# Patient Record
Sex: Female | Born: 1935
Health system: Southern US, Community
[De-identification: ages and names within clinical notes are randomized; demographics above are authoritative.]

## PROBLEM LIST (undated history)

## (undated) ENCOUNTER — Emergency Department (HOSPITAL_COMMUNITY): Disposition: A | Discharge status: Home / Self Care | Payer: Medicare Other

## (undated) DIAGNOSIS — J9801 Acute bronchospasm: Secondary | ICD-10-CM

## (undated) DIAGNOSIS — M1990 Unspecified osteoarthritis, unspecified site: Secondary | ICD-10-CM

## (undated) DIAGNOSIS — I5022 Chronic systolic (congestive) heart failure: Secondary | ICD-10-CM

## (undated) DIAGNOSIS — M5000 Cervical disc disorder with myelopathy, unspecified cervical region: Secondary | ICD-10-CM

## (undated) DIAGNOSIS — R0789 Other chest pain: Secondary | ICD-10-CM

## (undated) DIAGNOSIS — I428 Other cardiomyopathies: Secondary | ICD-10-CM

## (undated) DIAGNOSIS — I219 Acute myocardial infarction, unspecified: Secondary | ICD-10-CM

## (undated) DIAGNOSIS — J449 Chronic obstructive pulmonary disease, unspecified: Secondary | ICD-10-CM

## (undated) DIAGNOSIS — I2510 Atherosclerotic heart disease of native coronary artery without angina pectoris: Secondary | ICD-10-CM

## (undated) DIAGNOSIS — I10 Essential (primary) hypertension: Secondary | ICD-10-CM

## (undated) DIAGNOSIS — I712 Thoracic aortic aneurysm, without rupture: Secondary | ICD-10-CM

## (undated) DIAGNOSIS — Q330 Congenital cystic lung: Secondary | ICD-10-CM

## (undated) DIAGNOSIS — I7121 Aneurysm of the ascending aorta, without rupture: Secondary | ICD-10-CM

## (undated) DIAGNOSIS — R160 Hepatomegaly, not elsewhere classified: Secondary | ICD-10-CM

## (undated) DIAGNOSIS — Z9981 Dependence on supplemental oxygen: Secondary | ICD-10-CM

## (undated) DIAGNOSIS — F419 Anxiety disorder, unspecified: Secondary | ICD-10-CM

## (undated) DIAGNOSIS — N200 Calculus of kidney: Secondary | ICD-10-CM

## (undated) HISTORY — DX: Congenital cystic lung: Q33.0

## (undated) HISTORY — PX: TONSILLECTOMY AND ADENOIDECTOMY: SUR1326

## (undated) HISTORY — DX: Hepatomegaly, not elsewhere classified: R16.0

## (undated) HISTORY — DX: Other chest pain: R07.89

## (undated) HISTORY — DX: Atherosclerotic heart disease of native coronary artery without angina pectoris: I25.10

## (undated) HISTORY — DX: Cervical disc disorder with myelopathy, unspecified cervical region: M50.00

## (undated) HISTORY — DX: Chronic systolic (congestive) heart failure: I50.22

## (undated) HISTORY — PX: COLONOSCOPY: SHX174

## (undated) HISTORY — PX: CHOLECYSTECTOMY: SHX55

## (undated) HISTORY — DX: Unspecified osteoarthritis, unspecified site: M19.90

## (undated) HISTORY — PX: OTHER SURGICAL HISTORY: SHX169

## (undated) HISTORY — DX: Acute bronchospasm: J98.01

---

## 1999-07-12 ENCOUNTER — Ambulatory Visit (HOSPITAL_COMMUNITY): Admission: RE | Admit: 1999-07-12 | Discharge: 1999-07-12 | Payer: Self-pay | Admitting: Cardiology

## 2000-12-13 ENCOUNTER — Ambulatory Visit (HOSPITAL_COMMUNITY): Admission: RE | Admit: 2000-12-13 | Discharge: 2000-12-13 | Payer: Self-pay | Admitting: Internal Medicine

## 2000-12-27 ENCOUNTER — Encounter (INDEPENDENT_AMBULATORY_CARE_PROVIDER_SITE_OTHER): Payer: Self-pay | Admitting: Internal Medicine

## 2000-12-27 ENCOUNTER — Ambulatory Visit (HOSPITAL_COMMUNITY): Admission: RE | Admit: 2000-12-27 | Discharge: 2000-12-27 | Payer: Self-pay | Admitting: Internal Medicine

## 2001-05-06 ENCOUNTER — Ambulatory Visit (HOSPITAL_COMMUNITY): Admission: RE | Admit: 2001-05-06 | Discharge: 2001-05-06 | Payer: Self-pay | Admitting: Internal Medicine

## 2001-05-06 ENCOUNTER — Encounter (INDEPENDENT_AMBULATORY_CARE_PROVIDER_SITE_OTHER): Payer: Self-pay | Admitting: Internal Medicine

## 2001-09-22 ENCOUNTER — Inpatient Hospital Stay (HOSPITAL_COMMUNITY): Admission: RE | Admit: 2001-09-22 | Discharge: 2001-09-23 | Payer: Self-pay | Admitting: Neurological Surgery

## 2001-09-22 ENCOUNTER — Encounter: Payer: Self-pay | Admitting: Neurological Surgery

## 2002-03-19 ENCOUNTER — Encounter (HOSPITAL_COMMUNITY): Admission: RE | Admit: 2002-03-19 | Discharge: 2002-04-18 | Payer: Self-pay | Admitting: Rheumatology

## 2002-04-30 ENCOUNTER — Encounter (HOSPITAL_COMMUNITY): Admission: RE | Admit: 2002-04-30 | Discharge: 2002-05-30 | Payer: Self-pay | Admitting: Rheumatology

## 2002-06-05 ENCOUNTER — Encounter (HOSPITAL_COMMUNITY): Admission: RE | Admit: 2002-06-05 | Discharge: 2002-07-05 | Payer: Self-pay | Admitting: Rheumatology

## 2002-07-16 ENCOUNTER — Encounter (HOSPITAL_COMMUNITY): Admission: RE | Admit: 2002-07-16 | Discharge: 2002-08-15 | Payer: Self-pay | Admitting: Rheumatology

## 2002-09-24 ENCOUNTER — Encounter (HOSPITAL_COMMUNITY): Admission: RE | Admit: 2002-09-24 | Discharge: 2002-10-24 | Payer: Self-pay | Admitting: Rheumatology

## 2002-12-07 ENCOUNTER — Encounter (INDEPENDENT_AMBULATORY_CARE_PROVIDER_SITE_OTHER): Payer: Self-pay | Admitting: Internal Medicine

## 2002-12-07 ENCOUNTER — Ambulatory Visit (HOSPITAL_COMMUNITY): Admission: RE | Admit: 2002-12-07 | Discharge: 2002-12-07 | Payer: Self-pay | Admitting: Internal Medicine

## 2002-12-09 ENCOUNTER — Encounter (INDEPENDENT_AMBULATORY_CARE_PROVIDER_SITE_OTHER): Payer: Self-pay | Admitting: Internal Medicine

## 2002-12-09 ENCOUNTER — Ambulatory Visit (HOSPITAL_COMMUNITY): Admission: RE | Admit: 2002-12-09 | Discharge: 2002-12-09 | Payer: Self-pay | Admitting: Internal Medicine

## 2002-12-10 ENCOUNTER — Encounter (INDEPENDENT_AMBULATORY_CARE_PROVIDER_SITE_OTHER): Payer: Self-pay | Admitting: Internal Medicine

## 2002-12-10 ENCOUNTER — Ambulatory Visit (HOSPITAL_COMMUNITY): Admission: RE | Admit: 2002-12-10 | Discharge: 2002-12-10 | Payer: Self-pay | Admitting: Internal Medicine

## 2002-12-17 ENCOUNTER — Encounter (HOSPITAL_COMMUNITY): Admission: RE | Admit: 2002-12-17 | Discharge: 2003-01-16 | Payer: Self-pay | Admitting: Rheumatology

## 2003-08-10 ENCOUNTER — Ambulatory Visit (HOSPITAL_COMMUNITY): Admission: RE | Admit: 2003-08-10 | Discharge: 2003-08-10 | Payer: Self-pay | Admitting: Pulmonary Disease

## 2003-09-08 ENCOUNTER — Ambulatory Visit (HOSPITAL_COMMUNITY): Admission: RE | Admit: 2003-09-08 | Discharge: 2003-09-08 | Payer: Self-pay | Admitting: Internal Medicine

## 2003-10-05 ENCOUNTER — Ambulatory Visit (HOSPITAL_COMMUNITY): Admission: RE | Admit: 2003-10-05 | Discharge: 2003-10-05 | Payer: Self-pay | Admitting: Pulmonary Disease

## 2004-04-19 ENCOUNTER — Ambulatory Visit: Payer: Self-pay | Admitting: Cardiology

## 2004-04-24 ENCOUNTER — Inpatient Hospital Stay (HOSPITAL_BASED_OUTPATIENT_CLINIC_OR_DEPARTMENT_OTHER): Admission: RE | Admit: 2004-04-24 | Discharge: 2004-04-24 | Payer: Self-pay | Admitting: Cardiovascular Disease

## 2004-04-24 ENCOUNTER — Ambulatory Visit: Payer: Self-pay | Admitting: Cardiovascular Disease

## 2004-05-02 ENCOUNTER — Ambulatory Visit: Payer: Self-pay | Admitting: Cardiology

## 2004-07-06 ENCOUNTER — Ambulatory Visit: Payer: Self-pay | Admitting: Cardiology

## 2004-07-13 ENCOUNTER — Ambulatory Visit: Payer: Self-pay | Admitting: Internal Medicine

## 2004-08-03 ENCOUNTER — Ambulatory Visit: Payer: Self-pay | Admitting: Internal Medicine

## 2004-08-03 ENCOUNTER — Ambulatory Visit (HOSPITAL_COMMUNITY): Admission: RE | Admit: 2004-08-03 | Discharge: 2004-08-03 | Payer: Self-pay | Admitting: Internal Medicine

## 2004-08-29 ENCOUNTER — Ambulatory Visit (HOSPITAL_COMMUNITY): Admission: RE | Admit: 2004-08-29 | Discharge: 2004-08-29 | Payer: Self-pay | Admitting: Family Medicine

## 2004-10-18 ENCOUNTER — Ambulatory Visit: Payer: Self-pay | Admitting: Cardiology

## 2005-02-22 ENCOUNTER — Ambulatory Visit (HOSPITAL_COMMUNITY): Admission: RE | Admit: 2005-02-22 | Discharge: 2005-02-22 | Payer: Self-pay | Admitting: Pulmonary Disease

## 2005-03-01 ENCOUNTER — Ambulatory Visit (HOSPITAL_COMMUNITY): Admission: RE | Admit: 2005-03-01 | Discharge: 2005-03-01 | Payer: Self-pay | Admitting: Pulmonary Disease

## 2005-07-24 ENCOUNTER — Ambulatory Visit: Payer: Self-pay | Admitting: Cardiology

## 2005-08-22 ENCOUNTER — Ambulatory Visit: Payer: Self-pay | Admitting: Cardiology

## 2005-08-29 ENCOUNTER — Ambulatory Visit: Payer: Self-pay | Admitting: Cardiology

## 2005-09-04 ENCOUNTER — Ambulatory Visit (HOSPITAL_COMMUNITY): Admission: RE | Admit: 2005-09-04 | Discharge: 2005-09-04 | Payer: Self-pay | Admitting: Pulmonary Disease

## 2006-01-03 ENCOUNTER — Ambulatory Visit: Payer: Self-pay | Admitting: Cardiology

## 2006-01-11 ENCOUNTER — Ambulatory Visit: Payer: Self-pay | Admitting: Cardiology

## 2006-03-24 ENCOUNTER — Emergency Department (HOSPITAL_COMMUNITY): Admission: EM | Admit: 2006-03-24 | Discharge: 2006-03-24 | Payer: Self-pay | Admitting: Emergency Medicine

## 2006-05-22 ENCOUNTER — Ambulatory Visit: Payer: Self-pay | Admitting: Cardiology

## 2006-05-28 ENCOUNTER — Ambulatory Visit: Payer: Self-pay | Admitting: Cardiology

## 2006-11-01 ENCOUNTER — Ambulatory Visit: Payer: Self-pay | Admitting: Physician Assistant

## 2006-11-28 ENCOUNTER — Ambulatory Visit (HOSPITAL_COMMUNITY): Admission: RE | Admit: 2006-11-28 | Discharge: 2006-11-28 | Payer: Self-pay | Admitting: Pulmonary Disease

## 2006-12-04 ENCOUNTER — Ambulatory Visit (HOSPITAL_COMMUNITY): Admission: RE | Admit: 2006-12-04 | Discharge: 2006-12-04 | Payer: Self-pay | Admitting: Pulmonary Disease

## 2006-12-17 ENCOUNTER — Encounter (INDEPENDENT_AMBULATORY_CARE_PROVIDER_SITE_OTHER): Payer: Self-pay | Admitting: Interventional Radiology

## 2006-12-17 ENCOUNTER — Other Ambulatory Visit: Admission: RE | Admit: 2006-12-17 | Discharge: 2006-12-17 | Payer: Self-pay | Admitting: Interventional Radiology

## 2006-12-17 ENCOUNTER — Encounter: Admission: RE | Admit: 2006-12-17 | Discharge: 2006-12-17 | Payer: Self-pay | Admitting: Pulmonary Disease

## 2007-03-04 ENCOUNTER — Ambulatory Visit: Payer: Self-pay | Admitting: Cardiology

## 2007-03-12 ENCOUNTER — Ambulatory Visit: Payer: Self-pay | Admitting: Cardiology

## 2007-04-04 ENCOUNTER — Ambulatory Visit (HOSPITAL_COMMUNITY): Admission: RE | Admit: 2007-04-04 | Discharge: 2007-04-04 | Payer: Self-pay | Admitting: Pulmonary Disease

## 2007-04-07 ENCOUNTER — Ambulatory Visit: Payer: Self-pay | Admitting: Cardiology

## 2008-01-14 ENCOUNTER — Ambulatory Visit: Payer: Self-pay | Admitting: Cardiology

## 2008-04-07 ENCOUNTER — Encounter (INDEPENDENT_AMBULATORY_CARE_PROVIDER_SITE_OTHER): Payer: Self-pay | Admitting: Pulmonary Disease

## 2008-04-07 ENCOUNTER — Ambulatory Visit: Payer: Self-pay | Admitting: Cardiology

## 2008-04-07 ENCOUNTER — Ambulatory Visit (HOSPITAL_COMMUNITY): Admission: RE | Admit: 2008-04-07 | Discharge: 2008-04-07 | Payer: Self-pay | Admitting: Pulmonary Disease

## 2008-05-24 ENCOUNTER — Ambulatory Visit (HOSPITAL_COMMUNITY): Admission: RE | Admit: 2008-05-24 | Discharge: 2008-05-24 | Payer: Self-pay | Admitting: Pulmonary Disease

## 2008-06-09 ENCOUNTER — Ambulatory Visit: Payer: Self-pay | Admitting: Cardiology

## 2008-06-14 ENCOUNTER — Ambulatory Visit: Payer: Self-pay | Admitting: Surgery

## 2008-06-18 ENCOUNTER — Emergency Department (HOSPITAL_COMMUNITY): Admission: EM | Admit: 2008-06-18 | Discharge: 2008-06-18 | Payer: Self-pay | Admitting: Emergency Medicine

## 2008-06-29 ENCOUNTER — Ambulatory Visit: Payer: Self-pay | Admitting: Thoracic Surgery (Cardiothoracic Vascular Surgery)

## 2008-10-18 ENCOUNTER — Ambulatory Visit (HOSPITAL_COMMUNITY): Admission: RE | Admit: 2008-10-18 | Discharge: 2008-10-18 | Payer: Self-pay | Admitting: Cardiovascular Disease

## 2009-01-17 ENCOUNTER — Encounter: Payer: Self-pay | Admitting: Orthopedic Surgery

## 2009-01-17 ENCOUNTER — Emergency Department (HOSPITAL_COMMUNITY): Admission: EM | Admit: 2009-01-17 | Discharge: 2009-01-17 | Payer: Self-pay | Admitting: Emergency Medicine

## 2009-01-19 ENCOUNTER — Ambulatory Visit: Payer: Self-pay | Admitting: Orthopedic Surgery

## 2009-01-19 DIAGNOSIS — M542 Cervicalgia: Secondary | ICD-10-CM | POA: Insufficient documentation

## 2009-01-19 DIAGNOSIS — M7580 Other shoulder lesions, unspecified shoulder: Secondary | ICD-10-CM

## 2009-01-19 DIAGNOSIS — M25519 Pain in unspecified shoulder: Secondary | ICD-10-CM | POA: Insufficient documentation

## 2009-01-19 DIAGNOSIS — M25819 Other specified joint disorders, unspecified shoulder: Secondary | ICD-10-CM | POA: Insufficient documentation

## 2009-01-19 HISTORY — DX: Cervicalgia: M54.2

## 2009-02-03 ENCOUNTER — Emergency Department (HOSPITAL_COMMUNITY): Admission: EM | Admit: 2009-02-03 | Discharge: 2009-02-03 | Payer: Self-pay | Admitting: Emergency Medicine

## 2009-02-18 ENCOUNTER — Ambulatory Visit (HOSPITAL_COMMUNITY): Admission: RE | Admit: 2009-02-18 | Discharge: 2009-02-18 | Payer: Self-pay | Admitting: Neurological Surgery

## 2009-02-22 ENCOUNTER — Ambulatory Visit (HOSPITAL_COMMUNITY): Admission: RE | Admit: 2009-02-22 | Discharge: 2009-02-22 | Payer: Self-pay | Admitting: Pulmonary Disease

## 2009-04-04 ENCOUNTER — Ambulatory Visit (HOSPITAL_COMMUNITY): Admission: RE | Admit: 2009-04-04 | Discharge: 2009-04-04 | Payer: Self-pay | Admitting: Pulmonary Disease

## 2009-06-14 ENCOUNTER — Ambulatory Visit (HOSPITAL_COMMUNITY): Admission: RE | Admit: 2009-06-14 | Discharge: 2009-06-14 | Payer: Self-pay | Admitting: Pulmonary Disease

## 2009-07-27 ENCOUNTER — Emergency Department (HOSPITAL_COMMUNITY): Admission: EM | Admit: 2009-07-27 | Discharge: 2009-07-27 | Payer: Self-pay | Admitting: Emergency Medicine

## 2009-08-11 ENCOUNTER — Encounter (INDEPENDENT_AMBULATORY_CARE_PROVIDER_SITE_OTHER): Payer: Self-pay

## 2009-09-15 ENCOUNTER — Ambulatory Visit (HOSPITAL_COMMUNITY): Admission: RE | Admit: 2009-09-15 | Discharge: 2009-09-15 | Payer: Self-pay | Admitting: Pulmonary Disease

## 2009-09-26 ENCOUNTER — Ambulatory Visit (HOSPITAL_COMMUNITY): Admission: RE | Admit: 2009-09-26 | Discharge: 2009-09-26 | Payer: Self-pay | Admitting: Pulmonary Disease

## 2010-03-27 ENCOUNTER — Ambulatory Visit (HOSPITAL_COMMUNITY): Admission: RE | Admit: 2010-03-27 | Discharge: 2010-03-27 | Payer: Self-pay | Admitting: Pulmonary Disease

## 2010-05-23 ENCOUNTER — Ambulatory Visit
Admission: RE | Admit: 2010-05-23 | Discharge: 2010-05-23 | Payer: Self-pay | Source: Home / Self Care | Attending: Internal Medicine | Admitting: Internal Medicine

## 2010-06-04 ENCOUNTER — Encounter: Payer: Self-pay | Admitting: Thoracic Surgery (Cardiothoracic Vascular Surgery)

## 2010-06-04 ENCOUNTER — Encounter: Payer: Self-pay | Admitting: Pulmonary Disease

## 2010-06-04 ENCOUNTER — Encounter (INDEPENDENT_AMBULATORY_CARE_PROVIDER_SITE_OTHER): Payer: Self-pay | Admitting: Internal Medicine

## 2010-06-04 ENCOUNTER — Encounter: Payer: Self-pay | Admitting: Cardiovascular Disease

## 2010-06-05 ENCOUNTER — Encounter
Admission: RE | Admit: 2010-06-05 | Discharge: 2010-06-05 | Payer: Self-pay | Source: Home / Self Care | Attending: Internal Medicine | Admitting: Internal Medicine

## 2010-06-05 NOTE — Consult Note (Signed)
NAMEBERNIECE, [name]                ACCOUNT NO.:  [id number]  MEDICAL RECORD NO.:  [id number]          PATIENT TYPE:  OUT  LOCATION:  RAD                           FACILITY:  [facility]  PHYSICIAN:  Lionel December, M.D.    DATE OF BIRTH:  1935/10/16  DATE OF CONSULTATION:  05/23/2010 DATE OF DISCHARGE:  03/27/2010                                OFFICE NOTE.   PRESENTING COMPLAINT:  Left-sided abdominal pain, excessive flatulence with fecal seepage.  SUBJECTIVE:  [name] is a 75 year old Caucasian female who is here for scheduled visit.  She was last seen in [place] office.  She had incomplete colonoscopy.  We tried to arrange for virtual colonoscopy, but apparently was not covered.  The patient tells me that she talked with her insurance carrier and was told that were virtual colonoscopy would be covered.  She actually wrote in a piece of paper for me to look at. She continues to complain of pain in right mid abdomen.  She says that this pain essentially stays, it does not get better with bowel movements or any other activity or lack thereof.  She does not seem to get worse pain with meals.  Her appetite is good.  She states her bowels move soon after she drinks coffee and orange juice in the morning and she has a normal stool.  Rest of the day, she passes lots and lots of gas and quite often she will pass some liquid stool which is quite embarrassing for her.  She denies melena or rectal bleeding.  She remains very concerned about her daughter [name] who is severely handicapped and still taking care of at home.  She tells me she also has thoracic aneurysm which is small and she is being watched.  CURRENT MEDICATIONS: 1. Carvedilol 12.5 mg daily. 2. Trimethoprim 100 mg p.o. nightly. 3. Lisinopril 10 mg daily. 4. Prednisone 10 mg daily p.r.n. 5. ASA 81 mg daily. 6. Tramadol 50 mg t.i.d. p.r.n. 7. Clorazepate 7.5 mg b.i.d. p.r.n. 8. Folic acid 1 mg daily. 9. B12 one pill daily. 10.OTC  iron daily.  OBJECTIVE:  VITAL SIGNS:  Weight 183.9 pounds which is stable, she is 62 inches tall, pulse 72 per minute, blood pressure 130/70, temperature is 97.4. EYES:  Conjunctivae are pink.  Sclerae are nonicteric. MOUTH:  Oropharyngeal mucosa is normal. NECK:  No neck masses or thyromegaly noted. ABDOMEN:  Full.  Bowel sounds are normal.  No bruits noted on palpation, soft abdomen.  She has tenderness in the left upper and left lower quadrant both on superficial and deep palpation, however, no guarding, organomegaly or masses noted. EXTREMITIES:  No peripheral edema or clubbing noted.  ASSESSMENT: 1. Left-sided abdominal pain.  This pain does not have features of     irritable bowel syndrome.  I suspect this is musculoskeletal pain. 2. Excessive flatulence with fecal seepage.  She does not have typical     symptoms of malabsorption, but she certainly could have a small     bowel bacterial overgrowth.  She could also have irritable bowel     syndrome, although symptoms are not typical.  Her last colonoscopy     was incomplete.  She did have small polyp removed.  Her rest of her     colon needs to be examined one way or the other.  PLAN: 1. Metronidazole 250 mg p.o. t.i.d. for 10 days. 2. She will continue with high-fiber diet. 3. We will make another request for her to have virtual colonoscopy.     If this is again denied, we will proceed with barium enema.     Lionel December, M.D.     NR/MEDQ  D:  05/23/2010  T:  05/24/2010  Job:  [id number]  cc:   [name], M.D. Fax: [phone]  Electronically Signed by Lionel December M.D. on 06/05/2010 11:11:36 AM

## 2010-06-13 ENCOUNTER — Other Ambulatory Visit (INDEPENDENT_AMBULATORY_CARE_PROVIDER_SITE_OTHER): Payer: Self-pay | Admitting: Internal Medicine

## 2010-06-13 DIAGNOSIS — N289 Disorder of kidney and ureter, unspecified: Secondary | ICD-10-CM

## 2010-06-13 NOTE — Letter (Signed)
Summary: Recall, Screening Colonoscopy Only  [facility]  [address]   [place], [place] [address]   Phone: [phone]  Fax: [phone]    August 11, 2009  [facility] [address] [place], [place]  [address] 1935/12/24   Dear [name],   Our records indicate it is time to schedule your colonoscopy.    Please call our office at [phone] and ask for the nurse.   Thank you,  [name] Limes, LPN Cloria Spring, LPN  [facility] Ph: [phone]   Fax: [phone]

## 2010-06-13 NOTE — Letter (Signed)
Summary: History form  History form   Imported By: Jacklynn Ganong 01/20/2009 16:54:26  _____________________________________________________________________  External Attachment:    Type:   Image     Comment:   External Document

## 2010-06-13 NOTE — Assessment & Plan Note (Signed)
Summary: RT SHOULDER PAIN/NEEDS XRAY/MEDICARE/CAF   Vital Signs:  Patient profile:   75 year old female Weight:      175 pounds Pulse rate:   80 / minute Resp:     16 per minute  Vitals Entered By: Fuller Canada MD (January 19, 2009 10:48 AM)  Visit Type:  Follow-up  CC:  right shoulder pain and neck pain .  History of Present Illness: I saw [name] in the office today for an initial visit.  She is a 75 years old woman with the complaint of:  chief complaint: right shoulder pain, neck pain.  Had neck surgery 4 years ago, Dr. Danielle Dess, has plate, has not seen him in a while. Has numbness down the right arm and in fingers, has problems gripping with her right hand.  Has left shoulder replacement Dr. Edger House.  No injections or therapy for the right shoulder.  Went to the ER see below   pain -duration a long time.  -location  right shoulder to deltoid, has neck pain and stiffness with headaches, would like a collar. No locking or catching of the shoulder.  -severity [1-10]   10 sometimes for both areas.  -worsened by: raising arm, turning neck.  -improved by: Darvocet 01/17/09 had injection of Toradol in er, right hip, helped with neck and shoulder.  -xrays done & where:  [facility] spine xrays 01/17/09 [facility], will have  right shoulder xrays today.    Preventive Screening-Counseling & Management  Alcohol-Tobacco     Alcohol drinks/day: 0     Smoking Status: never  Caffeine-Diet-Exercise     Caffeine use/day: 3 cups of coffee per day  Allergies (verified): No Known Drug Allergies  Past History:  Past Medical History: htn anxiety  Past Surgical History: gallbladder hysterectomy c spine 4 years ago, plate in neck  left shoulder replacement  Family History: Family History Coronary Heart Disease female < 74 Family History of Arthritis  Social History: Patient is widowed.  Alcohol drinks/day:  0 Smoking Status:  never Caffeine use/day:  3 cups of coffee per  day  Review of Systems General:  Complains of fatigue; denies weight loss, weight gain, fever, and chills. Cardiac :  Complains of chest pain and heart attack; denies angina, heart failure, poor circulation, blood clots, and phlebitis. Resp:  Complains of short of breath; denies difficulty breathing, COPD, cough, and pneumonia. GI:  Complains of reflux; denies nausea, vomiting, diarrhea, constipation, difficulty swallowing, ulcers, and GERD. GU:  Complains of poor stream; denies kidney failure, kidney transplant, kidney stones, burning, testicular cancer, blood in urine, and . Neuro:  Complains of numbness, weakness, and unsteady walking; denies headache, dizziness, migraines, and tremor. MS:  Complains of joint pain, rheumatoid arthritis, and joint swelling; denies gout, bone cancer, osteoporosis, and . Endo:  Denies thyroid disease, goiter, and diabetes. Psych:  Complains of anxiety and panic attack; denies depression, mood swings, bipolar, and schizophrenia. Derm:  Denies eczema, cancer, and itching. EENT:  Complains of poor vision, poor hearing, and ears ringing; denies cataracts, glaucoma, vertigo, sinusitis, hoarseness, toothaches, and bleeding gums. Immunology:  Denies seasonal allergies, sinus problems, and allergic to bee stings. Lymphatic:  Denies lymph node cancer and lymph edema.  Physical Exam  Additional Exam:  GEN: well developed and nourished. normal body habitus, grooming and hygiene CDV: normal pulses perfusion color and temperature without swelling or edema Lymph: normal lymph system SKIN: normal without lesions, masses, nodules NEURO/PSYCH: Normal coordination, reflexes and sensation. awake alert and oriented  MSK:  gait is slow but normal   LE's no contractures, subluxation, atrophy or tremors   rt shoulder passive ROM = flex/160 [pain 100-160], ER normal, IR sacrum tenderness posterior subacromial area, AC joint NT  Sspinatus 4/5, ER 5/5 ABD-ER stable      Cspine non tender, decreased ROM in flexion, extension, rotation, neg spurlings, no instability  Lt shoulder AROM flexion is 120   Impression & Recommendations:  Problem # 1:  CERVICALGIA (ICD-723.1) Assessment Comment Only  see spine surgeon     Orders: New Patient Level III (59563)  Problem # 2:  IMPINGEMENT SYNDROME (ICD-726.2) Assessment: New  Verbal consent obtained/The shoulder was injected with depomedrol 40mg /cc and sensorcaine .25% . There were no complications [right shoulder]  Orders: New Patient Level III (87564) Depo- Medrol 40mg  (J1030) Joint Aspirate / Injection, Large (20610) Shoulder x-ray,  minimum 2 views (33295)  Problem # 3:  SHOULDER PAIN (ICD-719.41)  xrays  2 v rt shoulder  normal gleno humeral joint   IMPR normal shoulder   Orders: New Patient Level III (18841) Shoulder x-ray,  minimum 2 views (66063)  Medications Added to Medication List This Visit: 1)  Carvedilol 12.5 Mg Tabs (Carvedilol) 2)  Lisinopril 10 Mg Tabs (Lisinopril) 3)  Aspir-low 81 Mg Tbec (Aspirin) 4)  Clonazepam 0.5 Mg Tabs (Clonazepam) 5)  Darvocet-n 100 100-650 Mg Tabs (Propoxyphene n-apap) .... One by mouth q 4 hrs as needed pain  Patient Instructions: 1)  You have received an injection of cortisone today. You may experience increased pain at the injection site. Apply ice pack to the area for 20 minutes every 2 hours and take 2 xtra strength tylenol every 8 hours. This increased pain will usually resolve in 24 hours. The injection will take effect in 3-10 days.  2)  f/u as needed

## 2010-06-16 ENCOUNTER — Other Ambulatory Visit (HOSPITAL_COMMUNITY): Payer: Self-pay

## 2010-07-31 LAB — CREATININE, SERUM
Creatinine, Ser: 1.03 mg/dL (ref 0.4–1.2)
GFR calc Af Amer: >60 mL/min (ref >60)
GFR calc non Af Amer: 53 mL/min — ABNORMAL LOW (ref >60)

## 2010-08-18 LAB — COMPREHENSIVE METABOLIC PANEL
ALT: 29 U/L (ref 0–35)
ALT: 14 U/L (ref 0–35)
AST: 34 U/L (ref 0–37)
AST: 19 U/L (ref 0–37)
Albumin: 4.4 g/dL (ref 3.5–5.2)
Albumin: 4.1 g/dL (ref 3.5–5.2)
Alkaline Phosphatase: 56 U/L (ref 39–117)
Alkaline Phosphatase: 46 U/L (ref 39–117)
BUN: 12 mg/dL (ref 6–23)
BUN: 10 mg/dL (ref 6–23)
CO2: 30 mEq/L (ref 19–32)
CO2: 33 mEq/L — ABNORMAL HIGH (ref 19–32)
Calcium: 10.2 mg/dL (ref 8.4–10.5)
Calcium: 10.5 mg/dL (ref 8.4–10.5)
Chloride: 99 mEq/L (ref 96–112)
Chloride: 103 mEq/L (ref 96–112)
Creatinine, Ser: 0.71 mg/dL (ref 0.4–1.2)
Creatinine, Ser: 0.67 mg/dL (ref 0.4–1.2)
GFR calc Af Amer: >60 mL/min (ref >60)
GFR calc Af Amer: >60 mL/min (ref >60)
GFR calc non Af Amer: >60 mL/min (ref >60)
GFR calc non Af Amer: >60 mL/min (ref >60)
Glucose, Bld: 101 mg/dL — ABNORMAL HIGH (ref 70–99)
Glucose, Bld: 100 mg/dL — ABNORMAL HIGH (ref 70–99)
Potassium: 4 mEq/L (ref 3.5–5.1)
Potassium: 4.5 mEq/L (ref 3.5–5.1)
Sodium: 137 mEq/L (ref 135–145)
Sodium: 140 mEq/L (ref 135–145)
Total Bilirubin: 0.6 mg/dL (ref 0.3–1.2)
Total Bilirubin: 0.7 mg/dL (ref 0.3–1.2)
Total Protein: 7.5 g/dL (ref 6.0–8.3)
Total Protein: 7.2 g/dL (ref 6.0–8.3)

## 2010-08-18 LAB — CBC
HCT: 41.9 % (ref 36.0–46.0)
HCT: 41.1 % (ref 36.0–46.0)
Hemoglobin: 14.3 g/dL (ref 12.0–15.0)
Hemoglobin: 14.1 g/dL (ref 12.0–15.0)
MCHC: 34 g/dL (ref 30.0–36.0)
MCHC: 34.3 g/dL (ref 30.0–36.0)
MCV: 85.5 fL (ref 78.0–100.0)
MCV: 84.8 fL (ref 78.0–100.0)
Platelets: 358 10*3/uL (ref 150–400)
Platelets: 310 10*3/uL (ref 150–400)
RBC: 4.91 MIL/uL (ref 3.87–5.11)
RBC: 4.84 MIL/uL (ref 3.87–5.11)
RDW: 13.5 % (ref 11.5–15.5)
RDW: 13.7 % (ref 11.5–15.5)
WBC: 13 10*3/uL — ABNORMAL HIGH (ref 4.0–10.5)
WBC: 8.1 10*3/uL (ref 4.0–10.5)

## 2010-08-18 LAB — POCT CARDIAC MARKERS
CKMB, poc: <1 ng/mL — ABNORMAL LOW (ref 1.0–8.0)
CKMB, poc: <1 ng/mL — ABNORMAL LOW (ref 1.0–8.0)
Myoglobin, poc: 52.2 ng/mL (ref 12–200)
Myoglobin, poc: 80 ng/mL (ref 12–200)
Troponin i, poc: <0.05 ng/mL (ref 0.00–0.09)
Troponin i, poc: <0.05 ng/mL (ref 0.00–0.09)

## 2010-08-18 LAB — DIFFERENTIAL
Basophils Absolute: 0.1 10*3/uL (ref 0.0–0.1)
Basophils Absolute: 0 10*3/uL (ref 0.0–0.1)
Basophils Relative: 1 % (ref 0–1)
Basophils Relative: 0 % (ref 0–1)
Eosinophils Absolute: 0.1 10*3/uL (ref 0.0–0.7)
Eosinophils Absolute: 0.2 10*3/uL (ref 0.0–0.7)
Eosinophils Relative: 0 % (ref 0–5)
Eosinophils Relative: 3 % (ref 0–5)
Lymphocytes Relative: 12 % (ref 12–46)
Lymphocytes Relative: 28 % (ref 12–46)
Lymphs Abs: 1.6 10*3/uL (ref 0.7–4.0)
Lymphs Abs: 2.3 10*3/uL (ref 0.7–4.0)
Monocytes Absolute: 1 10*3/uL (ref 0.1–1.0)
Monocytes Absolute: 0.7 10*3/uL (ref 0.1–1.0)
Monocytes Relative: 8 % (ref 3–12)
Monocytes Relative: 8 % (ref 3–12)
Neutro Abs: 10.3 10*3/uL — ABNORMAL HIGH (ref 1.7–7.7)
Neutro Abs: 4.9 10*3/uL (ref 1.7–7.7)
Neutrophils Relative %: 79 % — ABNORMAL HIGH (ref 43–77)
Neutrophils Relative %: 61 % (ref 43–77)

## 2010-08-18 LAB — URINALYSIS, ROUTINE W REFLEX MICROSCOPIC
Bilirubin Urine: NEGATIVE
Glucose, UA: NEGATIVE mg/dL
Hgb urine dipstick: NEGATIVE
Ketones, ur: NEGATIVE mg/dL
Nitrite: NEGATIVE
Protein, ur: NEGATIVE mg/dL
Specific Gravity, Urine: 1.005 (ref 1.005–1.030)
Urobilinogen, UA: 0.2 mg/dL (ref 0.0–1.0)
pH: 5.5 (ref 5.0–8.0)

## 2010-08-18 LAB — URINE MICROSCOPIC-ADD ON

## 2010-08-29 LAB — D-DIMER, QUANTITATIVE (NOT AT ARMC): D-Dimer, Quant: 0.68 ug/mL-FEU — ABNORMAL HIGH (ref 0.00–0.48)

## 2010-08-29 LAB — POCT CARDIAC MARKERS
CKMB, poc: <1 ng/mL — ABNORMAL LOW (ref 1.0–8.0)
CKMB, poc: <1 ng/mL — ABNORMAL LOW (ref 1.0–8.0)
CKMB, poc: <1 ng/mL — ABNORMAL LOW (ref 1.0–8.0)
Myoglobin, poc: 43.2 ng/mL (ref 12–200)
Myoglobin, poc: 46 ng/mL (ref 12–200)
Myoglobin, poc: 50.4 ng/mL (ref 12–200)
Troponin i, poc: <0.05 ng/mL (ref 0.00–0.09)
Troponin i, poc: <0.05 ng/mL (ref 0.00–0.09)
Troponin i, poc: <0.05 ng/mL (ref 0.00–0.09)

## 2010-08-29 LAB — DIFFERENTIAL
Basophils Absolute: 0.1 10*3/uL (ref 0.0–0.1)
Basophils Relative: 1 % (ref 0–1)
Eosinophils Absolute: 0.4 10*3/uL (ref 0.0–0.7)
Eosinophils Relative: 5 % (ref 0–5)
Lymphocytes Relative: 44 % (ref 12–46)
Lymphs Abs: 3.2 10*3/uL (ref 0.7–4.0)
Monocytes Absolute: 0.7 10*3/uL (ref 0.1–1.0)
Monocytes Relative: 10 % (ref 3–12)
Neutro Abs: 2.9 10*3/uL (ref 1.7–7.7)
Neutrophils Relative %: 40 % — ABNORMAL LOW (ref 43–77)

## 2010-08-29 LAB — CBC
HCT: 41.3 % (ref 36.0–46.0)
Hemoglobin: 13.9 g/dL (ref 12.0–15.0)
MCHC: 33.6 g/dL (ref 30.0–36.0)
MCV: 84.9 fL (ref 78.0–100.0)
Platelets: 258 10*3/uL (ref 150–400)
RBC: 4.86 MIL/uL (ref 3.87–5.11)
RDW: 14.3 % (ref 11.5–15.5)
WBC: 7.3 10*3/uL (ref 4.0–10.5)

## 2010-08-29 LAB — BASIC METABOLIC PANEL
BUN: 10 mg/dL (ref 6–23)
CO2: 27 mEq/L (ref 19–32)
Calcium: 10.1 mg/dL (ref 8.4–10.5)
Chloride: 106 mEq/L (ref 96–112)
Creatinine, Ser: 0.79 mg/dL (ref 0.4–1.2)
GFR calc Af Amer: >60 mL/min (ref >60)
GFR calc non Af Amer: >60 mL/min (ref >60)
Glucose, Bld: 134 mg/dL — ABNORMAL HIGH (ref 70–99)
Potassium: 4 mEq/L (ref 3.5–5.1)
Sodium: 140 mEq/L (ref 135–145)

## 2010-09-26 NOTE — Assessment & Plan Note (Signed)
[facility]                          [facility] OFFICE NOTE   NAME:[name], [name]                       MRN:          [id number]  DATE:01/14/2008                            DOB:          Oct 03, 1935    ADDENDUM   I may have forgotten to mention above, please forward a copy to Dr.  Kari Baars at [facility].     Gene Serpe, PA-C     GS/MedQ  DD: 01/14/2008  DT: 01/15/2008  Job #: [id number]   cc:   [name], M.D.

## 2010-09-26 NOTE — Consult Note (Signed)
NEW PATIENT CONSULTATION   [name], [name]  DOB:  12-Oct-1935                                        June 29, 2008  CHART #:  [id number]   REASON FOR CONSULTATION:  Ascending aortic aneurysm.   HISTORY OF PRESENT ILLNESS:  The patient is a 75 year old woman sent for  consultation by Dr. Kari Baars regarding an ascending aortic  aneurysm.  The patient has been having which she describes as chest pain  and pressure for a long period of time.  She had been seeing doctors  over the past 5 years.  She has apparently seen Dr. Andee Lineman and it is  unclear, but she says she has had echocardiograms and 3 heart  catheterizations.  One time she was told there is blockage and another  time that there was no blockage, she is really unclear as to whether or  not she has coronary artery disease.  She states that she has never had  a heart attack, but she has had something and her heart only pumps  half of what it is supposed to.  She describes 2 separate pain  sensations, one of which is a pressure.  She had a particularly bad  episode last summer where she felt like someone was squeezing her from  behind, she could get her breath.  This was severe and prolonged.  More  recently she has been having a sharp stabbing pain along the left  anterior chest, which she says it lasts anywhere from 3-8 seconds.  She  has timed it because of a physician did ask her how long it lasted.  This is unpredictable and not necessarily related to exertion.  During  her evaluation, she had a CT of the chest.  She had had one in 2008 as  well.  Her CT from May 24, 2008, showed possible ascending aortic  aneurysm which the radiologist measured at 4.3 x 4.5 cm.  The patient  has been very concerned since she received the news that she had an  aneurysm.   Her past medical history is significant for cardiomyopathy, congestive  heart failure, possible coronary artery disease, possible MI,  pulmonary  nodule, esophageal reflux, positional vertigo, and anxiety.   Her current medications are Coreg 12.5 mg b.i.d., lisinopril 10 mg  daily, aspirin 81 mg daily, clorazepate 7.5 mg p.r.n., and Darvocet-N  100 p.r.n.   She has allergies to Percodan and Xanax, pain meds and Valium.   FAMILY HISTORY:  Significant for heart attack in her mother.  No history  of aneurysm in her immediate family.   SOCIAL HISTORY:  She is widowed.  She is accompanied today by her  daughter and grandson.  She does not smoke since she smoked slightly in  the past.   REVIEW OF SYSTEMS:  The patient's history form is reviewed and is on the  chart.  The patient notes chest pain, chest tightness, shortness of  breath, palpitations, abdominal pain, trouble swallowing, urinary  frequency, dizziness, arthritis, joint and muscle pain, nervousness, and  difficulty hearing.  All other systems are negative.   PHYSICAL EXAMINATION:  GENERAL:  The patient is an obese 75 year old  female in no acute distress.  VITAL SIGNS:  Her blood pressure is 154/76, pulse 74, respirations are  20, and oxygen saturation is 94% on room  air.  NEUROLOGIC:  She is anxious and slightly hard of hearing, but otherwise  intact.  HEENT:  Unremarkable.  NECK:  Supple without adenopathy or carotid bruits.  CARDIAC:  Regular rate and rhythm with a very distinctive as to there is  no murmur.  ABDOMEN:  Soft and nontender.  There is no palpable aneurysm.  She does  express discomfort with pressing the epigastrium which is mild.  LUNGS:  Clear with equal breath sounds bilaterally.  EXTREMITIES:  Without clubbing, cyanosis, or edema.  She has 2+ pulses  bilaterally.   CT scan is reviewed from May 24, 2008, and is compared side by side  with the CT from April 04, 2007.  There is a stable 3-mm nodule on  the right lower lobe.  There is a 4.3 cm right lobe of the liver mass  which has been described as a hemangioma, this was present  in her  previous study as well.  There is a prominent ascending aorta.  She  really has aortomegaly as her descending aorta measures a little over 3  cm.  Her ascending aorta is measured by the radiologist at 4.5 x 4.3.  Her aorta is unchanged in size or appearance from a scan in 2008.  Reviewing the coronal and sagittal sections gives the appearance of  enlarged, but not significantly aneurysmal aorta.   IMPRESSION:  Small ascending aortic aneurysm.  This is really more of a  fusiform dilatation.  Her whole aorta is relatively large.  This is  unchanged in 2 years and is unlikely to be related to her symptoms in  any way, shape or form.  This should be followed.  I do think the  radiologist maybe overestimating the size because it appears there  measuring this at a relatively tangential point.  I think the true size  of this maybe more like 4 cm in diameter which would still be large  enough to warrant followup at a noncontrast CT, but there is no sign of  leak or dissection.   I had a long discussion with the patient, reviewed these issues with  her, reviewed the actual CT scan with her, so she will have a better  understanding of what the issue is and recommended follow up with a CT  angio in 1 year.   She had many questions regarding what is causing her symptoms, although  I do not think they are related all to her aneurysm.  Her symptoms in  some way sound like angina.  I know she has had some cardiac workup.  I  do not know how recent that has been.  She is really a poor historian  regarding these issues.  I did recommend that she check back with Dr.  Juanetta Gosling and Dr. Andee Lineman regarding whether any additional cardiac workup  is warranted at this time.   Salvatore Decent Dorris Fetch, M.D.  Electronically Signed   SCH/MEDQ  D:  06/29/2008  T:  06/29/2008  Job:  [id number]   cc:   [name], M.D.  Learta Codding, MD,FACC

## 2010-09-26 NOTE — Assessment & Plan Note (Signed)
[facility]                          [facility] OFFICE NOTE   NAME:[name], [name]                       MRN:          [id number]  DATE:03/04/2007                            DOB:          [birth year]    PRIMARY CARDIOLOGIST:  Dr. Lewayne Bunting.   REASON FOR VISIT:  Scheduled 4 month followup.  Please refer to [facility] office note of June 20, for full details.   Since last seen in the clinic, [name] seems to suggest that her  symptoms have progressed somewhat.  In particular, she cites a recent  episode where she awoke early Saturday morning with profuse diaphoresis,  shortness of breath, and the sensation of midsternal chest squeezing.  She reports a blood pressure of 200 systolic and a heart rate of 103.  Despite these symptoms, she did not seek prompt medical attention, and  states that her blood pressure subsequently improved during the course  of the day.   She was also quite concerned about her current EKG; however, I assured  her that this was essentially unchanged from her previous study in June,  revealing normal sinus rhythm with a chronic left bundle branch block.   The patient was started on carvedilol 3.125 b.i.d. when last seen and  taken of Toprol.  She was to have had a followup blood pressure check in  2 weeks with plans for up titration of the medication.  However, it does  not appear that she ever returned to our office.   CURRENT MEDICATIONS:  1. Aspirin 81 daily.  2. Lisinopril 10 daily.  3. Carvedilol 3.125 b.i.d.   PHYSICAL EXAMINATION:  Blood pressure 139/71, pulse 72, regular.  Weight  186.4 (up 1 pound).  GENERAL:  A 75 year old female sitting upright in no distress.  HEENT:  Normocephalic, atraumatic.  NECK:  Palpable bilateral carotid pulses without bruits.  No JVD.  LUNGS:  Clear to auscultation in all fields.  HEART:  Regular rate and rhythm (S1, S2), no significant murmurs nor  rubs.  ABDOMEN:   Benign.  EXTREMITIES:  No significant pedal edema.  NEURO:  No focal deficit.   IMPRESSION:  1. Chronic systolic heart failure.      a.     Currently euvolemic.      b.     An ejection fraction of 40% by 2D echo, April 2007.  2. Nonischemic cardiomyopathy.      a.     Normal coronary arteries by cardiac catheterization,       December 2005.      b.     Negative cardiac catheterization in 2001.      c.     Ejection fraction of 40% with mild global hypokinesis by       catheterization, December 2005.  3. Ongoing tobacco.  4. Chronic left bundle branch block.  5. Hypertension.  6. History of palpitations.      a.     Negative CardioNet monitor, January of 2008.      b.     Documented premature ventricular contractions.   PLAN:  1. A 2D echocardiogram for reassessment of left ventricular function.      If this shows no significant change, than no further cardiac workup      is recommended at this time.  I reviewed with the patient her      cardiac catheterization from December 2005 revealing normal      coronary arteries.  Also, her ejection fraction was essentially the      same by cath in 2005 and by 2D echo in March 2007.  2. Up titrate carvedilol to 6.25 b.i.d. for more aggressive blood      pressure management.  3. Schedule return clinic followup with myself and Dr. Andee Lineman in 1      month for further recommendations.      Gene Serpe, PA-C  Electronically Signed      Learta Codding, MD,FACC  Electronically Signed   GS/MedQ  DD: 03/04/2007  DT: 03/05/2007  Job #: [id number]   cc:   [name], M.D.

## 2010-09-26 NOTE — Assessment & Plan Note (Signed)
[facility]                          [facility] OFFICE NOTE   NAME:[name], [name]                       MRN:          [id number]  DATE:01/14/2008                            DOB:          August 11, 1935    PRIMARY CARDIOLOGIST:  Learta Codding, MD, [facility]   REASON FOR VISIT:  Scheduled followup.   [name] returns to our clinic, since last seen by me in November  2008.  She has nonischemic cardiomyopathy, chronic systolic heart  failure, history of palpitations with a negative cardiac monitor,  January 2008, and normal coronary arteries; EF 40% by cardiac  catheterization in December 2005.   During her last visit, she reported significant exertional dyspnea as  well as occasional chest pain.  Consequently, I suggested a low-level  adenosine stress Cardiolite for risk stratification, with a low  threshold for a relook cardiac catheterization.  I had also referred to  her most recent echo, which indicated severe inferior wall hypokinesis.  The patient, however, cancelled the stress test.   Clinically, she seems to suggest that she is feeling pretty good.  Her  symptoms have certainly not worsened since her last visit.  She appears  euvolemic both by history and physical examination.  She continues to  have occasional flutter, but does refer to the fact that we have  extensively evaluated this in the past with event monitoring.   EKG today indicates a NSR at 76 bpm with chronic LBBB and chronic  inferolateral ST-segment changes.   CURRENT MEDICATIONS:  1. Aspirin 81 daily.  2. Lisinopril, 10 daily.  3. Carvedilol 6.25 b.i.d..   PHYSICAL EXAMINATION:  VITAL SIGNS:  Blood pressure 173/77, pulse 77,  regular, weight 184.8 (down 1).  GENERAL:  A 75 year old female, sitting upright, in no distress.  HEENT:  Normal.  NECK:  Palpable carotid pulse without bruits; no JVD.  LUNGS:  Clear to auscultation in all fields.  HEART:  Regular rate and rhythm.   No significant murmurs, no rubs.  ABDOMEN:  Protuberant, and benign.  EXTREMITIES:  No edema.  NEURO:  No focal deficit.   IMPRESSION:  1. Chronic systolic heart failure.      a.     Currently euvolemic.  2. Nonischemic cardiomyopathy.      a.     Ejection fraction 35- 40% by 2-D echo, October 2008.      b.     Normal coronary arteries by cardiac catheterization,       December 2005.  3. Chronic left bundle-branch block.  4. Hypertension, currently uncontrolled.  5. Longstanding palpitations.      a.     Negative CardioNet monitor, January 2008.      b.     Documented premature ventricular contractions.  6. Valvular heart disease.      a.     Mild aortic regurgitation.   PLAN:  1. Although I suggested a surveillance 2-D echo for reassessment of      left ventricular function, as well as monitoring of aortic      regurgitation, [name] is once again  disinclined for any      additional testing.  We will, therefore, continue to monitor her      closely for any significant change in her symptomatology.  2. Uptitrate Coreg to 12.5 mg b.i.d. for more aggressive management of      uncontrolled hypertension, as well as ongoing treatment of her      underlying cardiomyopathy.  3. Scheduled return clinic followup with myself and Dr. Andee Lineman in 6      months, or sooner as needed.      Gene Serpe, PA-C  Electronically Signed      Learta Codding, MD,FACC  Electronically Signed   GS/MedQ  DD: 01/14/2008  DT: 01/15/2008  Job #: [id number]   cc:   [name], M.D.

## 2010-09-26 NOTE — Assessment & Plan Note (Signed)
[facility]                          [facility]   NAME:[name], [name]                       MRN:          [id number]  DATE:06/09/2008                            DOB:          10-Jan-1936    HISTORY OF PRESENT ILLNESS:  The patient is an elderly female with  history of depression and negative cardiac catheterization in January of  2008 with normal coronary arteries.  She has non-ischemic cardiomyopathy  with an ejection fraction of 40%.  Her EF has remained stable.  She has  not presented with clinical congestive heart failure.  Her blood  pressure somewhat poorly controlled and we prescribed in addition  hydrochlorothiazide 12.5 mg p.o. daily.  The patient states that she has  multiple appointments.  CT scanning was done.  A lot of  these findings  were well known.  She has a referral to [facility] for what appears to be an  ascending aortic aneurysm, although it is only 4.5 x 4.3 and I  think it  is unlikely that Dr. Myra Gianotti will recommend surgery for this.  She also  has liver hemangioma and pulmonary nodule, which all have remained  stable.   From cardiovascular standpoint, the patient however is doing well.  She  has no orthopnea, PND, palpitations, or syncope.   MEDICATIONS:  1. Aspirin 81 mg p.o. daily.  2. Lisinopril 10 mg p.o. daily.  3. Carvedilol 12.5 mg p.o. b.i.d.   PROBLEM:  1. Chronic systolic heart failure, stable.  2. Non-ishemic cardiomyopathy, ejection fraction 35-40%.  3. Normal coronary arteries.  4. Left ventricular bundle-branch block.  5. Hypertension, somewhat poorly controlled.  6. Palpitation, resolved.  7. Mild aortic insufficiency, stable.   PLAN:  1. The patient's echo is stable and there is no progressive valvular      heart disease.  2. I added hydrochlorothiazide for better blood pressure control.  3. I told the patient that he certainly can go to [facility], but I do not      think that any surgical  recommendation will be given for this      moderate-sized aneurysm.  She will need probably a yearly followup.     Learta Codding, MD,FACC  Electronically Signed    GED/MedQ  DD: 06/09/2008  DT: 06/10/2008  Job #: [id number]

## 2010-09-26 NOTE — Letter (Signed)
June 29, 2008   Edward L. Juanetta Gosling, MD  [address]  [place], [place] [address]   Re:  JAMIRACLE, AVANTS                DOB:  02-Aug-1935   Dear Renae Fickle,   Thank you very much for allowing me to see the patient.  I saw her in  the office today.  She has an enlarged ascending aorta.  It is a sort of  borderline whether or not this is truly aneurysmal, but does not work  for the followup.  I do not think this is causing any of her symptoms  and fortunately, she had had an old CT back in 2008.  Looking at those  side-by-side even though the radiologist did not mention her aorta at  all in the scan from 2008, this aorta is completely unchanged since that  time.  Again, I recommended that she followup in 1 year with a CT angio.  I will plan to see her back at that time.   Salvatore Decent Dorris Fetch, M.D.  Electronically Signed   SCH/MEDQ  D:  06/29/2008  T:  06/29/2008  Job:  [id number]   cc:   Learta Codding, MD,FACC

## 2010-09-26 NOTE — Assessment & Plan Note (Signed)
[facility]                          [facility] NOTE   NAME:[name], [name] MIKEL                       MRN:          [id number]  DATE:01/14/2008                            DOB:          [birth year]    PRIMARY CARDIOLOGIST:  Learta Codding, MD, [facility]   REASON FOR VISIT:  Scheduled followup.   [name] [name] returns to our clinic, since last seen by me in November  2008.  She has nonischemic cardiomyopathy, chronic systolic heart  failure, history of palpitations with a negative cardiac monitor,  January 2008, and normal coronary arteries; EF 40% by cardiac  catheterization in December 2005.   During her last visit, she reported significant exertional dyspnea as  well as occasional chest pain.  Consequently, I suggested a low-level  adenosine stress Cardiolite for risk stratification, with a low  threshold for a relook cardiac catheterization.  I had also referred to  her most recent echo, which indicated severe inferior wall hypokinesis.  The patient, however, cancelled the stress test.   Clinically, she seems to suggest that she is feeling pretty good.  Her  symptoms have certainly not worsened, since her last visit.  She appears  euvolemic both by history and physical examination.  She continues to  have occasional flutter, but does refer to the fact that we have  extensively evaluated this in the past with event monitoring.   EKG today indicates a NSR at 76 bpm with chronic LBBB and chronic  inferolateral ST-segment changes.   CURRENT MEDICATIONS:  1. Aspirin 81 daily.  2. Lisinopril, 10 daily.  3. Carvedilol 6.25 b.i.d..   PHYSICAL EXAMINATION:  VITAL SIGNS:  Blood pressure 173/77, pulse 77,  regular, weight 184.8 (down 1).  GENERAL:  A 75 year old female, sitting upright, in no distress.  HEENT:  Normal.  NECK:  Palpable carotid pulse without bruits; no JVD.  LUNGS:  Clear to auscultation in all fields.  HEART:  Regular rate and  rhythm.  No significant murmurs, no rubs.  ABDOMEN:  Protuberant, and benign.  EXTREMITIES:  No edema.  NEURO:  No focal deficit.   IMPRESSION:  1. Chronic systolic heart failure.      a.     Currently euvolemic.  2. Nonischemic cardiomyopathy.      a.     EF 35- 40% by 2-D echo, October 2008.      b.     Normal coronary arteries by cardiac catheterization,       December 2005.  3. Chronic left bundle-branch block.  4. Hypertension, currently uncontrolled.  5. Longstanding palpitations.      a.     Negative CardioNet monitor, January 2008.      b.     Documented premature ventricular contractions.  6. Valvular heart disease.      a.     Mild aortic regurgitation.   PLAN:  1. Although I suggested a surveillance 2-D echo for reassessment of      LVF, as well as monitoring of aortic regurgitation, [name] [name] is      once again disinclined to proceed  with any additional testing.  We      will, therefore, continue to monitor her closely for any      significant change in her symptomatology.  2. Uptitrate Coreg to 12.5 mg b.i.d. for more aggressive management of      uncontrolled hypertension, as well as ongoing treatment of her      underlying cardiomyopathy.  3. Scheduled return clinic followup with myself and Dr. Andee Lineman in 6      months, or sooner as needed.      Gene Serpe, PA-C  Electronically Signed      Learta Codding, MD,FACC  Electronically Signed   GS/MedQ  DD: 01/14/2008  DT: 01/15/2008  Job #: [id number]   cc:   [name], M.D.

## 2010-09-26 NOTE — Assessment & Plan Note (Signed)
[facility]                          [facility]   NAME:[name], [name] PETTAWAY                       MRN:          [id number]  DATE:11/01/2006                            DOB:          [birth year]    PRIMARY CARDIOLOGIST:  Dr. Andee Lineman.   PRIMARY CARE PHYSICIAN:  Dr. Juanetta Gosling.   HISTORY OF PRESENT ILLNESS:  [name] is a 75 year old female patient  followed by Dr. Andee Lineman with a history of nonischemic cardiomyopathy, EF  40%. She has had 2 cardiac catheterizations in the past that revealed  normal coronary arteries. She has chronic exertional dyspnea with normal  BNP levels and normal PFTs. She returns to the office today for routine  followup. She continues to note atypical chest discomfort. This was left-  sided. It seems to occur when she notes palpitations. It also occurs  with certain changes in positioning. It is definitely reproducible by  palpation of her chest. She continues to note dyspnea on exertion. There  has really been no significant change since I saw her last in the  office. She sleeps on one large size pillow without any changes. She  denies any true paroxysmal nocturnal dyspnea. There was no significant  pedal edema. She denies any syncope. She does feel light-headed from  time to time. She does note an episode several years ago where she felt  near syncopal. Of note, she did have a CardioNet monitor placed after  she saw Dr. Andee Lineman last time. This returned revealing normal sinus  rhythm with occasional PVCs.   CURRENT MEDICATIONS:  1. Aspirin 81 mg daily.  2. Lisinopril 10 mg daily.  3. Metoprolol 25 mg daily.  4. Tranxene p.r.n.  5. Lidoderm p.r.n.  6. Darvocet p.r.n.   ALLERGIES:  CODEINE causes nausea, PERCOCET causes nausea.   PHYSICAL EXAMINATION:  GENERAL:  She is a well-nourished, well-developed  female in no distress.  VITAL SIGNS:  Blood pressure is 163/83, pulse 77, weight 185 pounds.  HEENT:  Normal.  NECK:  Without JVD.  CARDIAC:  Normal S1, S2. Regular rate and rhythm.  LUNGS:  Clear to auscultation bilaterally without wheezes, rhonchi or  rales.  ABDOMEN:  Soft, nontender with normal active bowel sounds. No  organomegaly.  EXTREMITIES:  Without edema. Calves soft nontender.  SKIN:  Warm and dry.  NEUROLOGIC:  She is alert and oriented x3. Cranial nerves II-XII grossly  intact.   Electrocardiogram  reveals sinus rhythm with a heart rate of 82. Left  bundle branch block.   IMPRESSION:  1. Chronic exertional dyspnea - stable.      a.     Felt to be multifactorial secondary to nonischemic       cardiomyopathy and deconditioning.  2. Chronic systolic congestive heart failure.  3. Nonischemic cardiomyopathy, EF of 40%.  4. Normal coronary arteries by catheterization December 2005.  5. Palpitations.      a.     Recent CardioNet monitor with normal sinus rhythm and       occasional PVCs.      b.     Chronic left bundle  branch block.  6. Atypical chest pain.  7. Fatigue.   PLAN:  The patient presents to the office today for routine followup.  She continues to have palpitations. I think she is probably symptomatic  from her PVCs. She continues to note chronic atypical chest pain. This  is clearly musculoskeletal in nature and there has really been no change  since we saw her last. Her dyspnea is also unchanged. She seems to be  optivolemic on exam. She notes a lot of fatigue to me today as well. We  will get some routine blood work today with a BMET, BNP, CBC and TSH. I  think her blood pressure and heart rate would certainly tolerate a  switch over to Coreg for her nonischemic cardiomyopathy. It is certainly  more affordable for her now that it is generic. Therefore I have placed  her on Carvedilol 3.125 mg twice a day. I will have her come back in  about 2 weeks for a blood pressure check. If her blood pressure and  heart rate will tolerate it then we will increase her to 6.25  mg twice  daily. She has been advised to stop her metoprolol. She will continue on  all of her other medications the same and followup with Dr. Andee Lineman in 4  months' time. As her symptoms are stable, no further workup is required  at this time for her chest discomfort and shortness of breath other than  what is listed above.      Tereso Newcomer, PA-C  Electronically Signed      Learta Codding, MD,FACC  Electronically Signed   SW/MedQ  DD: 11/01/2006  DT: 11/01/2006  Job #: [id number]   cc:   [name], M.D.

## 2010-09-26 NOTE — Assessment & Plan Note (Signed)
[facility]                          [facility]   NAME:Kemppainen, SHAUNTELLE JAMERSON                       MRN:          [id number]  DATE:04/07/2007                            DOB:          [birth year]    REASON FOR VISIT:  Scheduled one-month follow-up.  Please refer to my  previous note of October 21, for full details.   At that time, the patient was referred for a two-dimensional  echocardiogram for reassessment of left ventricular function, in light  of her complaint of worsening shortness of breath.  This study, however,  showed stable moderate LV dysfunction (EF 35-40%), as compared to the  previous study of April of 2007.  There was mention of severe inferior  wall hypokinesis.  This compares to the 2007 study which suggested  multiple wall motion abnormalities, with anteroseptal/anteroapical  akinesis.  It was noted that the dobutamine stress echo in 2003 showed  no inducible wall motion abnormalities.   I also increased her Coreg to the current dose of 12.5 mg b.i.d., for  more aggressive blood pressure management.   The patient informs me today that this medication adjustment seems to  have better controlled her blood pressure.  However, she continues to  report significant exertional dyspnea even with minimal exertion.  She  also has occasional chest pain, but this is not strictly associated with  activity.  She harkens back to an episode that she had the Saturday  prior to our last visit, when she awoke very short of breath and  diaphoretic and states that is when these symptoms originated.   Electrocardiogram today reveals NSR with chronic left bundle branch  block at 71 BPM.   MEDICATIONS:  1. Aspirin 81 mg daily.  2. Coreg 6.25 mg b.i.d.  3. Lisinopril 10 mg daily.   PHYSICAL EXAMINATION:  VITAL SIGNS:  Blood pressure 148/73, pulse 70 and  regular, weight 185.  GENERAL:  A 75 year old female, mildly obese, sitting upright in  no  distress.  HEENT:  Normocephalic and atraumatic.  NECK:  Palpable bilateral carotid pulses without bruits.  No JVD at 90  degrees.  LUNGS:  Clear to auscultation in all fields.  HEART:  Regular rate and rhythm (S1 and S2), no significant murmurs, and  no rubs.  ABDOMEN:  Benign.  EXTREMITIES:  No significant edema.  NEUROLOGY:  No focal deficit.   IMPRESSION:  1. Chronic systolic heart failure.      a.     Ejection fraction 35-40% by current 2-D echocardiogram.      b.     Ejection fraction 40% by 2-D echocardiogram, April 2007.  2. Progressive exertional dyspnea.      a.     Rule out anginal equivalent.  3. Nonischemic cardiomyopathy.      a.     Normal coronary arteries; ejection fraction 40% with mild       global hypokinesis by cardiac catheterization, December 2005.      b.     40% mid left anterior descending stenosis; normal left       ventricular function  by cardiac catheterization, February 2001.  4. Chronic left bundle branch block.  5. Hypertension.  6. History of palpitations.      a.     Negative CardioNet monitor, January 2008.      b.     Documented PVCs.  7. History of hyperlipidemia.   PLAN:  1. Schedule low level Adenosine stress Cardiolite for risk      stratification.  If this is suggestive of ischemia, then      recommendation is to consider a relook cardiac catheterization to      exclude significant CAD progression.  2. Continue Coreg at current dose and reassess when the patient      returns in one month, for review of stress test results and further      recommendations.      Gene Serpe, PA-C  Electronically Signed      Learta Codding, MD,FACC  Electronically Signed   GS/MedQ  DD: 04/07/2007  DT: 04/07/2007  Job #: [id number]

## 2010-09-29 NOTE — Cardiovascular Report (Signed)
NAMEMARQUEL, SPOTO                ACCOUNT NO.:  [id number]   MEDICAL RECORD NO.:  [id number]          PATIENT TYPE:  OIB   LOCATION:  6501                         FACILITY:  [facility]   PHYSICIAN:  Charlton Haws, M.D.     DATE OF BIRTH:  January 03, 1936   DATE OF PROCEDURE:  04/24/2004  DATE OF DISCHARGE:                              CARDIAC CATHETERIZATION   PROCEDURE:  Coronary arteriography.   INDICATIONS:  History of chest pain with shortness of breath.  The patient  had a previous heart catheterization in 2001 which showed nonobstructive  disease.   DESCRIPTION OF PROCEDURE:  Catheterization done with 4-French catheter from  right femoral artery.   1.  Left main coronary artery was normal.  2.  Left anterior descending artery was normal.  3.  First and second diagonal branches were small.  4.  Circumflex coronary artery was nondominant and normal.  5.  The right coronary artery was dominant and normal.  6.  RAO ventriculography:  RAO ventriculography showed mild left ventricular      cavity enlargement with mild global hypokinesis.  The EF was in the 40%      range.  There was no significant MR.  LV pressure was 162/19.  Aortic      pressure was 154/77.   IMPRESSION:  The patient appeared to have nonischemic cardiomyopathy with  bundle branch block. Continue medical therapy as warranted.   She will follow up with Dr. Diona Browner in [place].       PN/MEDQ  D:  04/24/2004  T:  04/24/2004  Job:  [id number]   cc:   Suszanne Conners. Julious Oka.   Jonelle Sidle, M.D. [facility]

## 2010-09-29 NOTE — H&P (Signed)
[facility]  Patient:    [name], [name] Visit Number: [id number] MRN: [id number]          Service Type: SUR Location: 3000 [id number] [id number] Attending Physician:  Jonne Ply Dictated by:   Stefani Dama, M.D. Admit Date:  09/22/2001                           History and Physical  ADMITTING DIAGNOSIS: Cervical spondylosis and left cervical radiculopathy.  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  She notes that for the past three months she has had difficulty with significant headaches, stiffness in the neck, and pain radiating to the left upper extremity, and occasionally into the right upper extremity.  Pain would radiate into her neck up into her ears.  She denied any overt numbness of tingling into the fingertips. However, more recently she has been having that sort of problem.  The pain also goes into her low back and she has difficulty with her lower extremities also.  She had an MRI performed in January 2003 and this demonstrates significant bulges with spondylitic deterioration at the C5-6 and C6-7 level. There is some left-sided foraminal compromise and impingement that is mild at best.  The patient was placed through considerable efforts at conservative management and having failed this and having continued difficulty with the neck, shoulder, and arm pain she was advised regarding surgical decompression of the cervical spine at C5-6 and C6-7 with structural allograft arthrodesis and Synthes fixation.  She is now being admitted for this procedure.  PAST MEDICAL HISTORY: The patient has generally had good health.  She reports no significant medical problems.  CURRENT MEDICATIONS:  1. Premarin 0.165 mg q.d.  2. Tranxene 7.5 mg p.r.n. for nerves.  3. Flexeril 10 mg as needed.  4. Darvocet as needed for pain.  5. She had been given a course of production  several months ago which helped     with the pain; however, the effects were short-lived.  ALLERGIES (both of which cause nausea):  1. CODEINE.  2. PERCODAN.  PAST SURGICAL HISTORY:  1. The patient had a shoulder operation on the left side on three occasions.  2. Tonsils and lymph nodes operated on years ago.  3. Hysterectomy.  4. Cholecystectomy in the past.  SOCIAL HISTORY: She smokes about two to three cigarettes per day.  She does not drink alcohol.  Height and weight have been stable.  REVIEW OF SYSTEMS: Notable for wearing glasses to read, hearing loss, ear pain, ringing in the ears, sinus problems, chest pain, angina with working, shortness of breath, bronchitis, difficulty with stopping urinary stream, arm weakness, leg weakness, back pain, arm pain, joint pain and swelling, arthritis and neck pain on a 14 point review sheet.  She also noted that she had some history of depression.  PHYSICAL EXAMINATION:  GENERAL: She is alert and oriented and cooperative individual.  VITAL SIGNS: Weight 186 pounds.  Height 5 feet 3 inches.  NEUROLOGIC: Range of motion in her neck is severely limited, turning only 45 degrees to the right and to the left.  She flexes and extends less than 50% of normal.  Axial compression reproduces localized posterior neck pain.  No masses are palpable in the supraclavicular fossa or neck.  No bruits are heard.  Motor strength in the arms reveals there is giveaway  weakness in the deltoids bilaterally and the biceps and triceps and grips and intrinsics also seem to have some giveaway at 4/5 bilaterally.  Deep tendon reflexes are 1+ in the biceps, 1+ in the triceps, 1+ in the patella and the Achilles.  Babinskis are downgoing.  Sensation is intact to vibration distally in the upper and lower extremities.  HEENT: Otherwise normal.  NECK: No masses are palpable in the neck.  No bruits are heard.  LUNGS: Clear to auscultation.  HEART: Regular rate and  rhythm.  ABDOMEN: Soft.  Bowel sounds positive.  No masses palpable.  EXTREMITIES: No clubbing, cyanosis, or edema.  IMPRESSION: The patient has spondylitic disease at C4-5 and C6-7.  She is now being taken to the operating room to undergo surgical decompression and stabilization at C5-6 and C6-7. Dictated by:   Stefani Dama, M.D. Attending Physician:  Jonne Ply DD:  09/22/01 TD:  09/23/01 Job: [id number]

## 2010-09-29 NOTE — Consult Note (Signed)
   NAME:  [name], [name]                          ACCOUNT NO.:  [id number]   MEDICAL RECORD NO.:  [id number]                   PATIENT TYPE:   LOCATION:                                       FACILITY:  [facility]   PHYSICIAN:  Aundra Dubin, M.D.            DATE OF BIRTH:  1935/09/22   DATE OF CONSULTATION:  04/30/2002  DATE OF DISCHARGE:                                   CONSULTATION   CHIEF COMPLAINT:  Polyarthritis.   HISTORY OF PRESENT ILLNESS:  The patient reports that she is feeling  considerably better.  She did not get the methotrexate refilled after her  first four weeks.  She is feeling so much better she was wondering if I  would turn her loose.  She has also stopped the prednisone around  Thanksgiving.  She was having some type of bladder pressure that is better  at this time.  Her weight is stable.  There is no polyuria or polydipsia.  There has been no URIs, fever, cough, nausea, shortness of breath, or  stomatitis.   MEDICATIONS:  1. Off methotrexate.  2. Off prednisone.  3. Flexeril 10 mg h.s.  4. Tranxene 50 mg h.s.   PHYSICAL EXAMINATION:  VITAL SIGNS:  Weight 188 pounds, blood pressure  150/80, respirations 16.  GENERAL:  No distress.  SKIN:  Clear.  LUNGS:  Clear.  HEART:  Regular.  No murmur.  MUSCULOSKELETAL:  There is still swelling to the PIPs, MCPs, and wrists but  these areas are less tender.  The wrists have some decreased range of  motion.  Elbows extend almost fully.  No nodules.  Shoulders are stiff with  a decreased range of motion.  Back nontender.  Knees, ankles, and feet are  cool and nontender.   ASSESSMENT/PLAN:  Rheumatoid arthritis.  She is considerably better.  I have  asked her to continue on the methotrexate at 17.5 mg q.week.  She is off the  prednisone and only if she starts to ache a great deal then she can go  back to using 5 mg q.d.  She had been on 10 mg prednisone q.d.  We will  check laboratories today and again in four  weeks.   She will return in two months.                                               Aundra Dubin, M.D.    WWT/MEDQ  D:  04/30/2002  T:  05/01/2002  Job:  [id number]   cc:   [name], M.D.  [address]  [place]  [place] [address]  Fax: [phone]

## 2010-09-29 NOTE — Consult Note (Signed)
[name], RYBOLT NO.:  [id number]   MEDICAL RECORD NO.:  [id number]                   PATIENT TYPE:   LOCATION:                                       FACILITY:   PHYSICIAN:  Aundra Dubin, M.D.            DATE OF BIRTH:   DATE OF CONSULTATION:  DATE OF DISCHARGE:                                   CONSULTATION   CHIEF COMPLAINT:  Rheumatoid arthritis, shoulders.   HISTORY OF PRESENT ILLNESS:  [name] reporting that she is feeling  better.  She is now taking the methotrexate more consistently.  She has had  no further oral ulcers or blisters.  Her weight is up three pounds.  She is  sore in the hands and wrists some, but her worst area continues to be the  left shoulder, which has undergone surgery three times.  She has had no  fever, cough, shortness of breath.  She has mild nausea with taking the  methotrexate.  She has had no vomiting.  There has been no diarrhea, rashes  or stomatitis.   MEDICATIONS:  1. Methotrexate 15 mg a week.  2. Folic acid 1 mg a day.  3. Prednisone 5 mg a day.  4. Darvocet about 1 per day.  5. Rare Flexeril.   PHYSICAL EXAMINATION:  VITAL SIGNS:  Weight 187 pounds, blood pressure  140/90, respirations 20.  GENERAL:  She appears well.  She is not as uncomfortable today as she was in  March.  SKIN:  She has no bruising.  HEENT:  Face:  No steroid fullness.  NECK:  Negative JVD.  LUNGS:  Clear.  HEART:  Regular.  No murmur.  EXTREMITIES:  Lower extremities:  No edema.  BACK:  Nontender.  MUSCULOSKELETAL:  She has less swelling to the PIPs, MCPs and wrists at this  time; however, they still have moderate tenderness.  Elbow has good range of  motion.  The left shoulder has decreased range of motion and is tender with  internal and external rotation.  Hips:  Good range of motion.  The knees are  cool and flex without tenderness.  The ankles and feet were nontender.   ASSESSMENT/PLAN:  Rheumatoid  arthritis.  She is improving.  I will have her  increase the methotrexate to 17.5 mg a week.  I suspect on 15 mg a week, if  I were to take her off the prednisone, she would not do well.  Hopefully, I  can lower this on return.  Labs checked on July 16, 2002 showed a WBC 10.0,  HGB 11.4, PLT 495, creatinine 0.7, AST 15, albumin 3.9.  We will check labs  today and on return.   She will return in 3 months.  Aundra Dubin, M.D.    WWT/MEDQ  D:  09/24/2002  T:  09/24/2002  Job:  [id number]   cc:   [name], M.D.  [address]  [place]  [place] [address]  Fax: [phone]

## 2010-09-29 NOTE — Consult Note (Signed)
NAME:  [name], [name]                          ACCOUNT NO.:  [id number]   MEDICAL RECORD NO.:  [id number]                   PATIENT TYPE:   LOCATION:                                       FACILITY:   PHYSICIAN:  Aundra Dubin, M.D.            DATE OF BIRTH:  [birth year]   DATE OF CONSULTATION:  12/17/2002  DATE OF DISCHARGE:                                   CONSULTATION   CHIEF COMPLAINT:  Rheumatoid arthritis.   [name] returns reporting that her hands and wrists are still quite sore  and stiff.  She asked about using Remicade.  She has Medicare but has no  supplemental insurance.  I have discussed with her that an infusion would  cost her out-of-pocket an approximate $300.  This will be too much for her  to bear.  She is aching throughout the hands and wrists.  She asked to have  a doctor clean out her left shoulder.  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  There have been no URIs,  fever, cough, nausea, or stomatitis.  She continues to have some nausea and  abdominal pain.  She has undergone a workup with an ultrasound showing a  cystic area to the liver and this has been confirmed as a hemangioma by a CT  scan.  She had a normal esophagus study.  There has been no weight loss.   MEDICINES:  1. Methotrexate possibly 17.5 mg weekly.  2. Prednisone 5 mg daily.  3. Darvocet b.i.d.  4. Tranxene 7.5 mg p.r.n.  5. Folic acid 1 mg daily.  6. Detrol LA.   PHYSICAL EXAMINATION:  VITAL SIGNS:  Weight 188 pounds, blood pressure  120/70, respirations 16.  GENERAL:  No distress.  LUNGS:  Clear.  HEART:  Regular with no murmur.  LOWER EXTREMITIES:  No edema.  BACK:  Nontender.  MUSCULOSKELETAL:  The hands and wrists show degenerative changes to the DIPs  but she still remains quite swollen at the MCPs and wrists.  These areas are  warm and tender with moderate palpation.  Elbows and right shoulder good  range of motion, the left  shoulder has decided decreased internal and  external rotation.  Knees are cool and nontender.  The ankles and feet had  mild tenderness.   ASSESSMENT AND PLAN:  1. Rheumatoid arthritis.  As I reviewed her methotrexate bottle, there were     two refills remaining on the Sep 24, 2002 prescription.  This indicates     to me that she likely is not really taking the 17.5 mg of methotrexate     each week.  I have encouraged her to take the four pills each Friday and     three pills each Sunday.  We are using it in this manner because of the     nausea.  I suspect that she  is really not taking this and her nausea is     from some other cause.  2. Back pain.  She will continue with the Darvocet p.r.n.   We will check labs today and I will see her back in two months.  I have  asked her to bring her methotrexate bottle with her at each office visit.                                               Aundra Dubin, M.D.    WWT/MEDQ  D:  12/17/2002  T:  12/17/2002  Job:  [id number]   cc:   [name], M.D.  [address]  [place]  [place] [address]  Fax: [phone]

## 2010-09-29 NOTE — Consult Note (Signed)
[name], [name] NO.:  [id number]   MEDICAL RECORD NO.:  [id number]                   PATIENT TYPE:   LOCATION:                                       FACILITY:   PHYSICIAN:  Aundra Dubin, M.D.            DATE OF BIRTH:   DATE OF CONSULTATION:  03/19/2002  DATE OF DISCHARGE:                                   CONSULTATION   CHIEF COMPLAINT:  Polyarthritis.   SUBJECTIVE:  The patient tells me today she ran out of the prednisone about  three days ago.  Her arthritis has been worsening as she was on 5 mg and has  decidedly worsened off of it.  She is aching a great deal in her hands,  wrists, knees, and feet.  She is stiff in the mornings for about an hour.  Her weight is fairly stable but she is up about 2 pounds.  There has been no  polyuria, polydipsia, back pain, vision changes, diarrhea, constipation,  chest pain, or shortness of breath.   PAST MEDICAL HISTORY:  I have reviewed my entire notes from December 19, 2001  when she was sent to me by Dr. Danielle [name].  I found that she had a symmetric  polyarthritis.  She responded quite well with the prednisone as I saw her  back on January 23, 2002.  Labs at that time showed an ESR of 51 and she  had a negative RF.   The social history, family history, and drug intolerances are all reviewed  from December 19, 2001 and are unchanged.   MEDICATIONS:  1. Presently off prednisone.  2. Flexeril 10 mg h.s.  3. Tranxene 50 mg h.s.  4. Premarin - off.   PHYSICAL EXAMINATION:  VITAL SIGNS:  Weight 187 pounds, blood pressure  150/90, respirations 18.  GENERAL:  No distress.  SKIN:  Clear.  LUNGS:  Clear.  HEART:  Regular, no murmur.  NECK:  Negative JVD.  Normal thyroid.  HEENT:  PERL/EOMI.  Mouth clear.  ABDOMEN:  Negative HSM, nontender.  MUSCULOSKELETAL:  The bilateral hands, right greater than left, show clear  swelling across the PIPs and MCPs and they are very tender with wincing.  The wrists  have mild swelling but are moderately tender with flexion.  Elbows and shoulders move well but there is moderate stiffness.  The back  was nontender, neck good range of motion.  The knees move stiffly and with  mild pain with flexion at 120 degrees.  The ankles were tender but cool.  She had moderate tenderness across the MTPs.   ASSESSMENT AND PLAN:  Polyarthritis.  She is, again, showing a symmetric  pattern of swelling that includes the PIPs, MCPs, wrists, and MTPs.  I  suspect that this is rheumatoid arthritis.  The plan is to give her an  injection of 120 mg of Depo-Medrol and then she will continue with  prednisone 10 mg a day.   I am starting her on Methotrexate 12.5 mg every week and folic acid 1 mg  q.d.  She does not drink alcohol.  I have discussed with her that  Methotrexate has potential side effects.  There is a slight chance of  serious problems such as liver cirrhosis.  We will follow LFTs and a CBC on  a regular basis for any changes or cytopenias.  She will be slightly immune  compromised and at greater risk of usual and unusual infections to include  PCP, TB, fungal, blasto, and others.  She is at risk of a pulmonary reaction  with fever, cough, and shortness of breath.  There is also a chance of  nausea, hair thinning, stomatitis, rashes, and headaches.  As I have  discussed with her, Methotrexate remains an excellent balance of safety,  efficacy, and cost.   When she will returns we will check labs, and I will see her back in five  weeks.                                               Aundra Dubin, M.D.    WWT/MEDQ  D:  03/19/2002  T:  03/19/2002  Job:  [id number]   cc:   [name], M.D.  [address]  [place]  [place] [address]  Fax: [phone]

## 2010-09-29 NOTE — Assessment & Plan Note (Signed)
[facility]                          [facility] OFFICE NOTE   NAME:[name], [name] MILAN                       MRN:          [id number]  DATE:05/22/2006                            DOB:          1936/05/03    HISTORY OF PRESENT ILLNESS:  The patient is a 75 year old female with a  history of nonischemic cardiomyopathy.  The patient has ejection  fraction of 40%.  She is hemodynamically stable.  She has no symptoms of  clinical heart failure.  Previous BNP levels have all been within normal  limits.  The patient does get short of breath, but appears to be  deconditioned.  Her PFTs also have been normal.  She occasionally  reports left-sided chest tightness, but this is nonexertional.  More  importantly, she does report episodes of fluttering and palpitations,  which occur every couple of days.  There is no associated dizziness or  weakness.  She presented to the [facility] emergency room in November  because of palpitations.  She was kept for 24 hours, but no further  diagnostic testing was done.   MEDICATIONS:  1. Aspirin 81 mg p.o. daily.  2. Lisinopril 10 mg p.o. daily.  3. Metoprolol 25 mg half tablet p.o. b.i.d.   PHYSICAL EXAMINATION:  VITAL SIGNS:  Blood pressure 132/68, heart rate  72 beats per minute, weight 183 pounds.  NECK:  Normal carotid upstroke.  No carotid bruits.  LUNGS:  Clear breath sounds bilaterally.  HEART:  Regular rate and rhythm.  Normal S1, S2.  No murmurs, rubs, or  gallops.  ABDOMEN:  Soft.  EXTREMITIES:  No cyanosis, clubbing, or edema.  NEURO:  The patient is alert and oriented, grossly nonfocal.   PROBLEM LIST:  1. Exertional dyspnea, chronic.      a.     Nonischemic cardiomyopathy ejection fraction 40%.      b.     Normal BNP levels.      c.     Normal PFTs.      d.     Deconditioning.  2. Status post cardiac catheterization x2 with normal coronary      arteries.  3. Long-standing palpitations, recurrent.  4.  Chronic left bundle branch block.   PLAN:  1. The patient has been scheduled for a CardioNet monitor to further      evaluate her palpitations.  She does appear to be compliant with      her medications and blood pressure      appears to be under control.  2. The patient can follow up with [place] in the next couple of weeks for      review of her CardioNet monitor.     [name] Codding, MD,FACC  Electronically Signed    GED/MedQ  DD: 05/22/2006  DT: 05/22/2006  Job #: [id number]

## 2010-09-29 NOTE — H&P (Signed)
[name]                            ACCOUNT NO.:  [id number]   MEDICAL RECORD NO.:  [id number]                  PATIENT TYPE:   LOCATION:                                       FACILITY:   PHYSICIAN:  Lionel December, M.D.                 DATE OF BIRTH:   DATE OF ADMISSION:  09/01/2003  DATE OF DISCHARGE:                                HISTORY & PHYSICAL   PRESENTING COMPLAINT:  1. Abdominal pain.  2. Nausea.  3. Anemia.   HISTORY OF PRESENT ILLNESS:  [name] is a 75 year old Caucasian female,  patient of Dr. Juanetta Gosling, who presents with multiple complaints.  For the last  several weeks, she has been having pain in her abdomen which starts in her  left lower-quadrant and migrates up to her left-upper-quadrant and into the  left rib cage, and across the right flank and goes into the right chest.  The pain on the left side is described as dull pain which is experienced  every day and not associated with change in her bowel habits, melena or  rectal bleeding.  The pain on the right side generally is experienced less  frequently, but has tried to be real bad.  She has nausea but no vomiting.  She also does not have a good appetite.  She has lost a few pounds recently.  She feels cold all the time.  She had lab studies by Dr. Juanetta Gosling including  TSH which was within normal limits.  She also had lab studies and was noted  to be anemic.  Dr. Juanetta Gosling did three hemoccults, all of which were negative.  The patient has been on aspirin along with her other medicines including  NSAID.  Since diagnosis of anemia, she has quit taking her aspirin.  She  states the pain on the right side reminds of her gallbladder pain which she  has had removed.  She denies heartburn, dysphagia, fever or chills.   She is on Nexium 40 mg q.a.m., Darvocet N 100 daily p.r.n., Toprol XL 25 mg  daily, Arthrotec 75 mg b.i.d., Tranxene 7.5 mg b.i.d. p.r.n. which she does  not take daily.   PAST MEDICAL HISTORY:   Medical problems include:  1. Coronary artery disease.  2. Chronic gastroesophageal reflux disease.  3. She had her esophagus dilated on multiple occasions, and the last one was     in August of 2002, with the disruption of esophageal web.  4. She also has colonic polyps.  5. She had a colonoscopy in August of 2002 which was incomplete.  She     subsequently had a barium enema which showed a polyp in proximal     transverse colon.  This was successfully removed on colonoscopy under     fluoroscopy in December of 2002.  This was a tubulovillous adenoma.  She     also had rheumatoid arthritis and previously has  been on prednisone and     methotrexate.  She had abdominal pelvic CT in July, 2004, because of     abdominal pain.  She had an incidental finding of 48 mm, but 30 mm     hemangioma within the right which was also seen on prior ultrasonography.     It showed a left renal cyst.  Pelvic CT showed cystocele and evidence of     hysterectomy.   She had had cholecystectomy and hysterectomy.  She had a benign tumor  removed from her breast.  She has had left shoulder surgery twice.  She has  had tacking of bladder, hemorrhoidectomy, and in May of 2003, she had neck  surgery for disk disease.   ALLERGIES:  CODEINE AND PERCODAN causing GI upset.   FAMILY HISTORY:  Positive for colon carcinoma in a brother who is deceased.  Another brother died of lung carcinoma.  One sister died of MI, and she has  three sisters living.   SOCIAL HISTORY:  She is widowed.  She has three children.  One of her  daughters had mental retardation, and she has been looking after her for  many years.  She does not drink alcohol or smoke cigarettes.   PHYSICAL EXAMINATION:  GENERAL:  A pleasant, well-developed, well-nourished  Caucasian female who is in no acute distress.  VITAL SIGNS:  Weight 178 pounds.  She is 5 feet, 3 inches tall.  Pulse is 84  per minute.  Blood pressure 140/84.  She is afebrile.   Conjunctivae is pink.  Sclerae is nonicteric.  Oropharyngeal mucosa is normal.  She has complete  upper and partial lower denture plate.  NECK:  Without masses or thyromegaly.  CARDIAC:  Regular rhythm, normal S1 and S3.  No murmur or gallop noted.  LUNGS:  Clear to auscultation.  ABDOMEN:  Symmetrical.  Bowel sounds are normal.  She does not have bruits.  Abdomen is soft, mild tenderness, LUQ, epigastric area and right upper  quadrant.  No organomegaly or masses.  RECTAL:  Examination is deferred.  EXTREMITIES:  She does not have clubbing or peripheral edema.   LABORATORY DATA:  On August 10, 2003, WBC is 7.5, hemoglobin and hematocrit  10.8 and 35.3, MCV 75.6, platelet count 433,000.  Her LFT's were normal.  TSH was 1.   ASSESSMENT:  [name] is a 75 year old Caucasian female who presents with  migratory abdominal pain which is both in the left and right side with  nausea.  Her gastroesophageal reflux disease symptoms appeared to be well  controlled.  She is noted to have anemia with low MCV, but three out of  three hemoccults are negative.  She has a history of colonic polyp, and her  last colonoscopy with polypectomy was in December of 2002.   I suspect that she has peptic ulcer disease secondary to NSAID therapy  consisting or Arthrotec and ASA.  This may also explain the chronic  intermittent blood loss either from an upper GI tract or small bowel.  If  peptic ulcer disease is documented, she may not need colonoscopy which  planned for December 2007.   RECOMMENDATIONS:  1. She will continue Nexium as before.  2. Esophagogastroduodenoscopy in the near future.  She was given Nexium     samples.  3. She will have H&H repeated along with RA, TIBC and serum ferritin at the     time of her endoscopy.     ___________________________________________  Lionel December, M.D.  NR/MEDQ  D:  09/01/2003  T:  09/01/2003  Job:  [id number]   cc:   Ramon Dredge L.  Juanetta Gosling, M.D.  [address]  [place]  [place] [address]  Fax: [phone]

## 2010-09-29 NOTE — Consult Note (Signed)
NAME:  [name], [name] NO.:  [id number]   MEDICAL RECORD NO.:  [id number]                   PATIENT TYPE:   LOCATION:                                       FACILITY:   PHYSICIAN:  Aundra Dubin, M.D.            DATE OF BIRTH:   DATE OF CONSULTATION:  07/16/2002  DATE OF DISCHARGE:                                   CONSULTATION   CHIEF COMPLAINT:  Rheumatoid arthritis, shoulders; insomnia.   HISTORY OF PRESENT ILLNESS:  The patient returns in a great deal of turmoil.  She had to call the office because of some mouth sores.  This was in  December.  I had her lower the methotrexate to 12.5 mg every week.  Today,  her description of these sores, I am not sure if they were related to the  methotrexate.  She is hurting a great deal in the bilateral shoulders, left  more than the right.  She did have x-[facility] through [facility].  The x-  ray report of the right shoulder shows only some slight AC joint  arthropathy.  The left shoulder shows ossification at the humerus.  This  is probably the calcification that I saw at the insertion area of the  supraspinatus on my x-ray.  She also had a chest x-ray which was clear.  The  hip x-ray was normal.   She describes again how she has to take care of an invalid son and husband.  She does not sleep at night because she has to arise to attend to their  needs.  I believe she is worn out and very fatigued.  She says she is just  not able to keep going on easily because of so much fatigue.  There has been  no URI, cough, stomatitis, shortness of breath.   LABORATORY DATA:  Labs on 06/05/2002 showed an albumin of 4.0, AST 19,  creatinine 0.8.  WBC 11.8, hemoglobin 12.6, platelets 409.   MEDICATIONS:  1. Methotrexate 12.5 mg every week.  2. Flexeril 10 mg p.r.n.  3. Transene 7.5 mg p.r.n.  4. Prednisone 10 mg daily.  5. Darvocet p.r.n.  6. Folic acid 1 mg daily.  7. Detrol LA 4 mg daily.   PHYSICAL  EXAMINATION:  VITAL SIGNS:  Weight 184 pounds, blood pressure  130/72, respirations 16.  GENERAL:  She appears tired.  LUNGS:  Slight crackles at the bases.  NECK:  Negative JVD.  Normal thyroid.  HEART:  Regular without murmur.  EXTREMITIES:  Lower extremities with no edema.  MUSCULOSKELETAL:  The MCPs and wrists still show slight fullness and have  tenderness.  The bilateral shoulders have poor abduction, and the left can  only lift to 85 degrees and the right 110 degrees.  She has limited internal  and external rotation.  The back had mild tenderness.  Hips, knees, ankles,  and feet had a good range  of motion and showed no synovitis.   PROCEDURE:  Left rotator cuff tendinitis injection.  The skin was marked and  cleaned with Betadine and alcohol swabs and cooled with ethyl chloride.  Depo-Medrol 80 mg and 0.5 cc of 2% lidocaine was injected.   ASSESSMENT AND PLAN:  1. Rheumatoid arthritis.  She has moderately flared up in a number of     joints.  She has in addition been given 120 mg of Depo-Medrol  IM.  I     will have her increase the methotrexate to 15 mm every week.  I would     like to go higher, but I think she may be having some nausea.  2. Shoulder tendinitis.  She has worked with an orthopedists in the past.     She has to be reevaluated by one.  I will see how the shot does as above     and consider an orthopedic referral.  She has worked with Dr. Tresa Res in     the past.  3. Insomnia.  She wants nothing to help with sleep.  She does use some     Darvocet which I think is appropriate.  4. We will check labs today, and she will return in about two months.                                               Aundra Dubin, M.D.    WWT/MEDQ  D:  07/16/2002  T:  07/16/2002  Job:  [id number]   cc:   [name], M.D.  [address]  [place]  [place] [address]  Fax: [phone]

## 2010-09-29 NOTE — Cardiovascular Report (Signed)
[facility]  Patient:    [name], [name]                         MRN: [id number] Proc. Date: 07/12/99 Adm. Date:  [id number] Disc. Date: [id number] Attending:  Learta Codding CC:         Kari Baars, M.D.                        Cardiac Catheterization  PROCEDURES PERFORMED: 1. Left heart catheterization with selective coronary angiography. 2. Ventriculography.  DIAGNOSES: 1. Single-vessel coronary artery disease. 2. Normal left ventricular systolic function. 3. Diastolic dysfunction.  INDICATIONS:  The patient is a 75 year old white female with a history of coronary artery disease.  She is status post questionable PTCA approximately 15 years ago. The patient was initially referred to the [facility] office by Dr. Juanetta Gosling due to substernal chest pain over the last year.  In addition, the patient also complains of some shortness of breath upon exertion.  Subsequent to this evaluation, the patient was referred for a diagnostic cardiac catheterization to assess her coronary anatomy.  DESCRIPTION OF PROCEDURE:  After informed consent was obtained, the patient was  brought to the catheterization laboratory.  The right groin was sterilely prepped and draped.  One percent Lidocaine was used to infiltrate the right groin and a  6-French arterial sheath was placed using the modified Seldinger technique. Subsequently, 6-French JL-4 and JR-4 catheters were used to engage the left and  right coronary arteries.  Selective angiography was performed in various projections using manual injection of contrast.  After selective coronary angiography, a 6-French pigtail catheter was then advanced via the femoral artery and into the left ventricle.  Appropriate left-sided hemodynamics were obtained. Left ventriculogram was obtained using power injections of contrast.  After ventriculography, the pigtail catheter was pulled back into the ascending aorta to determine  outflow gradient across the aortic valve.  At the termination of the case, the catheter and sheaths were removed and manual pressure was applied until adequate hemostasis was achieved.  The patient tolerated the procedure well and was transferred to the floor in stable condition.  FINDINGS:  VENTRICULOGRAPHY:  Single-plane RAO projection for ventriculography revealed a normal LV systolic function, with no segmental wall motion abnormalities. Significant ventricular ectopy was noted and the ejection fraction was estimated at 65% to 70% post PVC beat.  HEMODYNAMICS:  Left ventricular pressure was 171/21 mmHg.  The aortic pressure as 169/89 mmHg.  SELECTIVE CORONARY ANGIOGRAPHY: 1. The left main coronary artery was a large caliber vessel with no flow-limiting    disease.  2. The left anterior descending artery was a medium sized vessel with blocking    noted in the mid segment of the vessel.  There were two small tandem lesions    graded at approximately 40%.  3. The left circumflex coronary artery had no evidence of flow-limiting disease.  4. The right coronary system was dominant.  There was no flow-limiting lesion seen    in this vessel.  CONCLUSIONS: 1. Mild single-vessel coronary artery disease with 40% stenosis in the mid    left anterior descending artery. 2. Normal left ventricular systolic function, but with elevated left ventricular    end-diastolic pressure, consistent with diastolic dysfunction.  RECOMMENDATIONS:  The results have been discussed with the patient, as well as he family.  The results are communicated with Dr. Juanetta Gosling.  The patient will  be followed up by both Dr. Andee Lineman and Dr. Juanetta Gosling.  She will be started on an ACE inhibitor for hypertension and diastolic dysfunction.  Further workup may be required if she does no improve with ACE inhibitor therapy. DD:  07/12/99 TD:  07/12/99 Job: [id number]

## 2010-09-29 NOTE — Op Note (Signed)
[facility]  Patient:    PEARSON, REASONS Visit Number: [id number] MRN: [id number]          Service Type: SUR Location: 3000 [id number] Attending Physician:  Jonne Ply Dictated by:   Stefani Dama, M.D. Proc. Date: 09/22/01 Admit Date:  09/22/2001 Discharge Date: 09/23/2001                             Operative Report  PREOPERATIVE DIAGNOSES:  C5-6, C6-7 spondylosis and left cervical radiculopathy with cervicalgia.  POSTOPERATIVE DIAGNOSES:  C5-6, C6-7 spondylosis and left cervical radiculopathy with cervicalgia.  OPERATION PERFORMED:  Anterior cervical diskectomy and arthrodesis C5-6 and C6-7, structural allografts Synthes fixation.  SURGEON:  Stefani Dama, M.D.  ASSISTANT:  Hewitt Shorts, M.D.  ANESTHESIA:  General endotracheal.  INDICATIONS FOR PROCEDURE:  The patient is a 75 year old individual who has had significant neck, shoulder and left arm pain.  She has had a significant problem with unrelenting pain despite efforts at conservative management.  She is taken to the operating room.  DESCRIPTION OF PROCEDURE:  The patient was brought to the operating room and placed on the table in supine position.  After smooth induction of general endotracheal anesthesia she was placed in five pounds of halter traction.  The neck was shaved, prepped with DuraPrep and draped in a sterile fashion.  A transverse incision was made in the left side of the neck and carried down through the platysma.  The plane between the sternocleidomastoid and the strap muscles was dissected bluntly until the prevertebral space was reached.  First identifiable disk space was known to be that of C5-6.  Caspar self-retaining retractor was placed in the wound and then the C5-6 disk was opened removing a large ventral osteophyte off the left side of the neck.  The C5-6 disk was then cleared out using a combination of curets and rongeurs and a high  speed drill with a 2.3 mm dissecting tool was used to clear out the disk sites on either side.  Spondylitic ridges were removed in this fashion and when these were cleared and the posterior longitudinal ligament was cleared, a 7 mm tricortical graft was placed with a cortical surface facing dorsally. Attention was then turned to C6-5 where a similar procedure was carried out. Here the spondylitic ridging was smaller.  Once this was cleared out and hemostasis was achieved from the prevertebral veins which bled profusely on the right side.  A 7 mm graft was placed into the interspace in a similar fashion.  Traction was removed and the neck was placed in slight flexion and a 37 mm standard size Synthes plate was contoured to the prevertebral space and fixed with six locking 4 x 14 mm screws.  The area was copiously irrigated with antibiotic irrigating solution and after checking the position of the plates, the grafts and the hardware, the platysma was closed with 3-0 Vicryl suture in interrupted fashion, 3-0 Vicryl was used to close the subcuticular tissues.  The patient tolerated the procedure well and was returned to the recovery room in stable condition. Dictated by:   Stefani Dama, M.D. Attending Physician:  Jonne Ply DD:  09/22/01 TD:  09/23/01 Job: [id number]

## 2010-09-29 NOTE — Op Note (Signed)
[name], SLAUBAUGH                ACCOUNT NO.:  [id number]   MEDICAL RECORD NO.:  [id number]          PATIENT TYPE:  AMB   LOCATION:  DAY                           FACILITY:  [facility]   PHYSICIAN:  Lionel December, M.D.    DATE OF BIRTH:  1936/04/19   DATE OF PROCEDURE:  08/03/2004  DATE OF DISCHARGE:                                 OPERATIVE REPORT   PROCEDURE:  Total colonoscopy.   INDICATIONS:  Angelee is a 75 year old Caucasian female with a history of  colonic polyps, whose last exam was over four years ago.  Presents with  recurrent rectal pain and lower abdominal pain.  We feel that she has IBS,  although she has not responded well to therapy.  Family history is  significant for colon carcinoma in a brother.  She is undergoing  colonoscopy, primarily for surveillance purposes.   Procedure and risks were reviewed with the patient, and informed consent was  obtained.  Please note her colonoscopy in June, 2002 was to the proximal and  mid transverse colon with snare polypectomy.  This polyp had been picked up  on prior barium enema, which was done because her exam was incomplete.   PREOP MEDICATIONS:  Demerol 50 mg IV, Versed 10 mg IV in divided dose.   FINDINGS:  Procedure performed in endoscopy suite.  The patient's vital  signs and O2 sat were monitored during the procedure and remain stable.  The  patient was placed in the left lateral decubitus position.  Rectal  examination performed.  No abnormality noted on external or digital exam.  The Olympus videoscope was placed in the rectum and advanced under vision  into the proximal sigmoid colon, which was tortuous and noncompliant.  Slowly and carefully, the scope was passed into the descending colon and  splenic flexure area.  It was difficult to advance the scope into the  transverse colon until the loop was completely reduced.  She had to be  turned onto her back and onto the right side.  Once the loop was  straightened, the  scope was advanced into the cecum, which was identified by  the ileocecal valve and appendiceal orifice.  Pictures taken for the record.  As the scope was withdrawn, the colonic mucosa was carefully examined.  Her  prep was felt to be satisfactory.  She had a single small polyp at the  hepatic flexure, which was ablated by cold biopsy.  The mucosa of the rest  of the colon was normal.  The scope was retroflexed and examined in the  rectal junction, and small hemorrhoids are noted below the dentate line.  The endoscope was straightened and withdrawn.  The patient tolerated the  procedure well.   FINAL DIAGNOSIS:  1.  Examination performed to cecum.  2.  Small polyp at hepatic flexure, which was ablated by cold biopsy.  3.  Small external hemorrhoids.   RECOMMENDATIONS:  Standard instructions given.  She will receive my usual  meds.  I will be contacting the patient with biopsy results.  I feel she  could wait five years  before her next exam.      NR/MEDQ  D:  08/03/2004  T:  08/03/2004  Job:  [id number]   cc:   [name], M.D.  [address]  [place]  [place] [address]  Fax: [phone]

## 2010-09-29 NOTE — Op Note (Signed)
[facility]  Patient:    [name], [name] Visit Number: [id number] MRN: [id number]          Service Type: END Location: DAY Attending Physician:  Malissa Hippo Dictated by:   Lionel December, M.D. Proc. Date: 05/06/01 Admit Date:  05/06/2001   CC:         Dr. Sherryll Burger   Operative Report  PROCEDURE:  Colonoscopy with polypectomy.  INDICATIONS:  Ms. Zuver is a 75 year old Caucasian female who underwent colonoscopy in August which could not be completed.  She, therefore, returned for a barium enema which showed a large polyp in the distal transverse colon. She is now returning for repeat colonoscopy under fluoroscopy.  Procedure was reviewed with the patient, and informed consent was obtained.  PREOPERATIVE MEDICATIONS:  Demerol 50 mg IV, Versed 10 mg IV in divided doses.  INSTRUMENT:  Olympus video system.  FINDINGS:  The procedure was performed in the endoscopy department. Dr. ______ assistant helped with fluoroscopy.   The patients vital signs and O2 saturation were monitored during the procedure and remained stable.  The patient was placed in left lateral position, and rectal examination performed.  This was within normal limits.  The procedure was started with a regular scope.  There was a very sharp turn in the distal sigmoid colon almost like a hairpin bend.  With the help of abdominal pressure, I was able to pass the scope in to the distal descending colon.  However, this sharp turn could never be completely abolished by withdrawing the scope multiple times.  I, therefore, pulled the scope out and started the procedure again with the pediatric scope.  With some difficulty, I was able to pass the scope to the distal transverse colon.  There was a flap polyp about 12 mm in diameter. Barium enema has shown polyp in exactly the same place.  This polyp was snared, caught with Dormia basket, and retrieved.  The rest of the colonic mucosa was  normal.  The patient tolerated the procedure fairly well.  FINAL DIAGNOSES: 1. Examination performed to mid transverse colon. 2. A 12 mm polyp snared from distal transverse colon.  RECOMMENDATIONS:  Standard instructions given.  I will be contacting patient with biopsy results later this week.  I will plan to bring her back for virtual colonoscopy three years from now. Dictated by:   Lionel December, M.D. Attending Physician:  Malissa Hippo DD:  05/06/01 TD:  05/08/01 Job: [id number]

## 2010-09-29 NOTE — H&P (Signed)
[name], [name]                ACCOUNT NO.:  [id number]   MEDICAL RECORD NO.:  [id number]          PATIENT TYPE:  AMB   LOCATION:                                 FACILITY:   PHYSICIAN:  R. Roetta Sessions, M.D. DATE OF BIRTH:  01-23-1936   DATE OF ADMISSION:  DATE OF DISCHARGE:  LH                                HISTORY & PHYSICAL   CHIEF COMPLAINT:  1.  Rectal pain.  2.  Right-sided abdominal pain.   HISTORY OF PRESENT ILLNESS:  [name] is a 75 year old Caucasian female who  presents today for further evaluation of the above-stated symptoms.  She was  last seen at the time of EGD on September 08, 2003, at which time she had  abdominal pain primarily in the epigastrium associated with nausea and noted  to be anemic.  She was on Arthrotec plus aspirin and was suspected to peptic  ulcer disease.  She had an EGD which revealed bowel in the stomach.  There  was some erythema, but no erosions.  A submucosal lipoma of the second part  of the duodenum, which was an incidental finding.  She also had a CT of the  abdomen and pelvis which revealed a hepatic hemangioma and left renal cyst  which had been seen previously.  She is status post cholecystectomy.  Her  hemoglobin has gradually improved with iron supplementation.  The last time  it was checked, it was 11.3 with hematocrit 35 on March 30, 2004.  At the  time of this dictation, today's laboratories have returned and her  hemoglobin is 11.5, hematocrit 34.6 and MCV 72.  She presents today  primarily with complaints of abdominal pain and rectal pain.  She feels like  her rectum is moving in and out when she sits or stands.  She has a burning  sensation.  She has pain with defecation.  This has been going on for the  last several months.  Her stools are dark on iron.  She feels like sometimes  the toilet water is reddish tinted.  She also complains of abdominal pain,  usually in the right lower quadrant region and sometimes radiating into  the  back.  This pain is unrelated to meals.  At times it is severe.  She is  unsure whether it has any relationship to her bowel movements.  She has  typical reflux symptoms and is no longer on Nexium for unclear reasons.  She  is concerned that she may have colon cancer.  She has a history of  adenomatous polyp in the past at the time of her colonoscopy in 2002.  She  has two brothers who have had colon cancer as well.   CURRENT MEDICATIONS:  1.  Darvocet-N 100 one daily p.r.n.  2.  Aspirin p.r.n.  3.  Toprol 25 mg half daily.  4.  Arthrotec 75 mg once daily.  5.  Iron one daily.  6.  Clorazepate 7.5 mg p.r.n.  7.  Lidoderm patch.  8.  Altace 5 mg daily.  9.  Phenergan p.r.n.   ALLERGIES:  PERCODAN and CODEINE cause GI upset.   PAST MEDICAL HISTORY:  1.  Coronary artery disease.  2.  Chronic gastrointestinal reflux disease.  3.  She had her esophagus dilated on multiple occasions, the last one in      August of 2002 with disruption of esophageal web.  4.  She has a history of colonic polyps.  Colonoscopy in August of 2002 was      incomplete.  She subsequently had a barium enema which showed a polyp in      the proximal transverse colon.  This was successfully removed on      colonoscopy under fluoroscopy in December of 2002.  This was      tubulovillous adenoma.  5.  Rheumatoid arthritis.  Previously on prednisone and methotrexate.  6.  She has a history of liver hemangioma.  7.  Hypertension.  8.  Chronic left bundle branch block.   PAST SURGICAL HISTORY:  1.  She has had previous cholecystectomy.  2.  Hysterectomy.  3.  She had a benign tumor removed from her breast.  4.  She has had left shoulder surgery twice.  5.  She has had tacking of her bladder.  6.  Hemorrhoidectomy.  7.  Neck surgery for disk disease.   FAMILY HISTORY:  Positive for colorectal cancer in a brother.  She states  that another one of her brothers has colon cancer.  One brother died of lung   cancer.  One sister died of MI.   SOCIAL HISTORY:  She is widowed.  She has three children.  One of her  daughters has mental retardation and she has been looking after her for  years.  She did not drink alcohol or smoke cigarettes.   REVIEW OF SYSTEMS:  GASTROINTESTINAL:  See HPI.  CONSTITUTIONAL:  Denies any  weight loss.  CARDIOPULMONARY:  Denies any chest pain or shortness of  breath.   PHYSICAL EXAMINATION:  WEIGHT:  172 pounds.  VITAL SIGNS:  Blood pressure 124/72, pulse 76.  GENERAL APPEARANCE:  A pleasant, well-nourished, well-developed, Caucasian  female in no acute distress.  SKIN:  Warm and dry.  No jaundice.  HEENT:  The conjunctivae are pink.  Sclerae nonicteric.  Oropharynx moist  and pink.  No lesions, erythema or exudates.  She has upper partial and  lower denture plates.  NECK:  No lymphadenopathy or thyromegaly.  CHEST:  The lungs are clear to auscultation.  CARDIAC:  Regular rate and rhythm.  Normal S1 and S2.  No murmurs, rubs or  gallops.  ABDOMEN:  Positive bowel sounds.  Soft and nondistended.  She has mild  tenderness in the right lower quadrant region to deep palpation.  No  organomegaly or masses.  No rebound tenderness or guarding.  No abdominal  hernias or bruits.  EXTREMITIES:  No edema.   IMPRESSION:  [name] is a 75 year old lady who presents for evaluation of  lower abdominal pain and rectal pain.  Symptoms have been persistent for  several months.  She has a history of adenomatous colonic polyp found in  2002, as well as a family history of colorectal cancer.  She has a history  of iron deficiency anemia and has been maintained on iron supplementations  chronically with persistent iron deficiency anemia, although the hemoglobin  has improved.  She is quite concerned regarding her abdominal pain and  history of anemia.  She is asking for her surveillance colonoscopy at this time given her person history of polyps  and family history of colon cancer.   She is due in 2007, however, it would be reasonable at this time proceed.  Her abdominal pain may be in part due to IBS.  On rectal examination, her  rectum appeared to be normal and she has not had any constipation, therefore  fissure is unlikely.  GERD symptoms are recurrent, although she is no longer  on PPI therapy.   PLAN:  1.  Colonoscopy in the near future.  Will discuss further with Dr. Karilyn Cota      with regards to need for fluoroscopy.  2.  CBC.  3.  Levbid #30 one p.o. daily, one refill.  4.  Nexium 40 mg p.o. daily, #30 with three refills.  5.  Anusol suppositories one per rectum q.h.s. for 14 days, #14 with zero      refills.      LL/MEDQ  D:  07/13/2004  T:  07/13/2004  Job:  [id number]   cc:   [name], M.D.  [address]  [place]  [place] [address]  Fax: [phone]

## 2010-09-29 NOTE — Op Note (Signed)
NAME:  [name], [name]                          ACCOUNT NO.:  [id number]   MEDICAL RECORD NO.:  [id number]                   PATIENT TYPE:  AMB   LOCATION:  DAY                                  FACILITY:  [facility]   PHYSICIAN:  Lionel December, M.D.                 DATE OF BIRTH:  February 23, 1936   DATE OF PROCEDURE:  09/08/2003  DATE OF DISCHARGE:                                 OPERATIVE REPORT   PROCEDURE:  Esophagogastroduodenoscopy.   INDICATIONS:  Shirlee is a 75 year old Caucasian female who presents with  abdominal pain which is epigastric also __________ type of pain associated  with nausea.  She was noted to be anemic.  Her stool is guaiac negative.  Iron studies are pending.  She has chronic GERD and is on PPI.  Symptoms are  well controlled.  She is also on Arthrotec Plus, aspirin and therefore,  suspected to have peptic ulcer disease.  She is undergoing diagnostic EGD.  Procedure was reviewed with the patient and informed consent was obtained.   PREMEDICATION:  Cetacaine spray for pharyngeal topical anesthesia, Demerol  50 mg IV, Versed 4 mg IV in divided doses.   FINDINGS:  The procedures were performed in the endoscopy suite.  The  patient's vital signs and O2 saturation were monitored during the procedure  and remained stable.   The patient was placed in the left lateral recumbent position.  Olympus  video scope was passed through the oropharynx without any difficulty into  the esophagus.   Esophagus:  Mucosa of the esophagus was normal throughout.  Squamocolumnar  junction was unremarkable.  No hernia was noted.  Gastroesophageal junction  was felt to be patulous as it stayed open.   Stomach:  It has some bile in it.  There was no food debris.  Stomach  distended very well with insufflation.  Folds of the proximal stomach were  normal.  Examination of the mucosa revealed some erythema and granularity of  the antrum, but no erosions or ulcers are noted.  Pyloric channel was  patent.  Angularis, fundus and cardia were examined by retroflexing the  scope and were normal.   Duodenum:  Examination of the bulb revealed normal mucosa.  Scope was passed  to the second part of the duodenum.  Mucosa and folds were normal.  There  was a small submucosal nodule at the second part of the duodenum and  yellowish discoloration.  Mucosa __________ was normal.  It has classical  features of lipoma, which was about 5-6 mm and was left alone.  Endoscope  was withdrawn.  The patient tolerated the procedure well.   FINAL DIAGNOSIS:  1. No endoscopic evidence of reflux esophagitis.  2. No erosive antral gastritis.  This could be related to bile reflux.  3. Incidental finding of small submucosal lipoma at the second part of the     duodenum.   RECOMMENDATIONS:  1. Will obtain abdominal/pelvic CT scan.  2. Levbid one tablet every morning.  She will continue her Nexium as before.  3. I will be contacting the patient when iron studies are back and CT scan     has been done and reviewed.      ___________________________________________                                            Lionel December, M.D.   NR/MEDQ  D:  09/08/2003  T:  09/08/2003  Job:  [id number]   cc:   [name], M.D.  [address]  [place]  [place] [address]  Fax: [phone]

## 2010-09-29 NOTE — Assessment & Plan Note (Signed)
[facility]                            [facility] OFFICE NOTE   NAME:[name], [name]                       MRN:          [id number]  DATE:01/03/2006                            DOB:          09/20/1935    PRIMARY CARDIOLOGIST:  [name] Codding, MD, [facility].   REASON FOR OFFICE VISIT:  Scheduled 90-month followup.   [name] is a 75 year old female with known nonischemic cardiomyopathy,  last seen here in the office in April 2007.  At that time, she had  uptitration of medications and was referred for an echocardiogram for  reassessment of LV function.  This revealed persistent LVD, with an EF of  40% and no significant valvular abnormalities.   The patient also had blood work showing normal potassium, renal function,  and a BNP of 59.   The patient now presents in followup.  She continues to report significant  dyspnea with any strenuous activity or with moderate exertion.  For example,  she enjoys gardening, but states that she is unable to do so secondary to  associated dyspnea and a burning sensation in the middle of her chest.  Although she can climb a flight of stairs, she does have associated dyspnea.  However, she reports no significant worsening since her last office visit.  She also denies any PND or orthopnea or significant lower extremity edema.   The patient has history of known PVCs, and this also seems to be stable.   CURRENT MEDICATIONS:  1. Aspirin 81 daily.  2. Lisinopril 10 daily.  3. Metoprolol 12.5 b.i.d.   PHYSICAL EXAMINATION:  VITAL SIGNS:  Blood pressure 122/70, pulse 76,  regular, weight 183 (down 1 pound).  NECK:  Palpable bilateral carotid pulses, without bruits.  No JVD.  LUNGS:  Clear to auscultation in all fields.  HEART:  Regular rate and rhythm (S1, S2).  Positive S4.  No S3.  No  significant murmurs.  EXTREMITIES:  Palpable distal pulses, with no significant edema.  NEUROLOGIC:  No focal deficit.   IMPRESSION:  1. Persistent exertional dyspnea.  1A.  Ejection fraction 40% by most recent echocardiogram.  1. Nonischemic cardiomyopathy.  2A.  Status post prior cardiac catheterization in 2001 and December 2005,  revealing normal coronary arteries.  1. Longstanding palpitations.  3A.  Known chronic premature ventricular contractions.  1. Chronic left bundle branch block.  2. Hypertension.  3. History of asthma.  4. Chronic anemia.  5. History of medication noncompliance.   PLAN:  Following discussion with Dr. Andee [name], recommendation is to continue  medical management, and we will therefore uptitrate her current regimen to  lisinopril 10 b.i.d. and metoprolol 25 b.i.d.  We will have her return in 1  week for a follow-up BMET, blood pressure, and pulse check.   With respect to her persistent exertional dyspnea, we will refer her to Dr.  Seabron [name] for further evaluation, preceded by scheduling her for  pulmonary function tests.  In essence, we feel that she has no decompensated  heart failure, and that this may be pulmonary in etiology.  Of note,  she had  a recent negative BNP.   We will plan on having the patient return to the clinic to resume followup  with Dr. Andee [name] in 3 months.                                   [name] Serpe, PA-C                                [name] Codding, MD, [facility]   GS/MedQ  DD:  01/03/2006  DT:  01/04/2006  Job #:  [id number]   cc:   [name] Dredge L. Juanetta Gosling, MD

## 2010-10-12 ENCOUNTER — Ambulatory Visit (INDEPENDENT_AMBULATORY_CARE_PROVIDER_SITE_OTHER): Payer: Medicare Other | Admitting: Internal Medicine

## 2010-10-12 DIAGNOSIS — R109 Unspecified abdominal pain: Secondary | ICD-10-CM

## 2010-11-27 ENCOUNTER — Other Ambulatory Visit (HOSPITAL_COMMUNITY): Payer: Self-pay | Admitting: Pulmonary Disease

## 2010-11-27 DIAGNOSIS — I719 Aortic aneurysm of unspecified site, without rupture: Secondary | ICD-10-CM

## 2010-11-28 ENCOUNTER — Encounter (HOSPITAL_COMMUNITY): Payer: Self-pay

## 2010-11-28 ENCOUNTER — Ambulatory Visit (HOSPITAL_COMMUNITY)
Admission: RE | Admit: 2010-11-28 | Discharge: 2010-11-28 | Disposition: A | Discharge status: Home / Self Care | Payer: Medicare Other | Source: Ambulatory Visit | Attending: Pulmonary Disease | Admitting: Pulmonary Disease

## 2010-11-28 DIAGNOSIS — I712 Thoracic aortic aneurysm, without rupture, unspecified: Secondary | ICD-10-CM | POA: Insufficient documentation

## 2010-11-28 DIAGNOSIS — R079 Chest pain, unspecified: Secondary | ICD-10-CM | POA: Insufficient documentation

## 2010-11-28 DIAGNOSIS — I719 Aortic aneurysm of unspecified site, without rupture: Secondary | ICD-10-CM

## 2010-11-28 MED ORDER — IOHEXOL 350 MG/ML SOLN
100.0000 mL | Freq: Once | INTRAVENOUS | Status: AC | PRN
  Administered 2010-11-28: 100 mL via INTRAVENOUS

## 2011-03-27 ENCOUNTER — Ambulatory Visit (INDEPENDENT_AMBULATORY_CARE_PROVIDER_SITE_OTHER): Payer: Medicare Other | Admitting: Orthopedic Surgery

## 2011-03-27 ENCOUNTER — Encounter: Payer: Self-pay | Admitting: Orthopedic Surgery

## 2011-03-27 VITALS — BP 160/80 | Ht 62.0 in | Wt 178.0 lb

## 2011-03-27 DIAGNOSIS — M654 Radial styloid tenosynovitis [de Quervain]: Secondary | ICD-10-CM

## 2011-03-27 NOTE — Patient Instructions (Signed)
Ice 3 x a day   Aspercreme 3 x a day   Wear splint 6 weks   You have dequervain's syndrome which is tendonitis of the thumb and wrist

## 2011-04-02 ENCOUNTER — Encounter: Payer: Self-pay | Admitting: Orthopedic Surgery

## 2011-04-02 DIAGNOSIS — M654 Radial styloid tenosynovitis [de Quervain]: Secondary | ICD-10-CM | POA: Insufficient documentation

## 2011-04-02 HISTORY — DX: Radial styloid tenosynovitis (de quervain): M65.4

## 2011-04-02 NOTE — Progress Notes (Signed)
Chief complaint: left thumb  HPI:(61) 75 year old female with 6 months of pain in the LEFT wrist associated with a mild injury.  She complains of sharp dull throbbing pain in the LEFT thumb associated with ulnar deviation of the wrist and some numbness and swelling.  ROS:(2) Chest pain, shortness of breath wheezing and cough.  Nausea hematuria or frequency and numbness tingling nervousness anxiety easy bruising and bleeding excessive urination joint pain swelling and instability stiffness redness heat and muscle pain  PFSH: (1)  Past Medical History  Diagnosis Date  . Aneurysm   . Liver mass   . Lung, cysts, congenital     left lung cyst  . Kidney stone     embedded  . Hemorrhoids      Physical Exam(12) GENERAL: normal development   CDV: pulses are normal   Skin: normal  Lymph: nodes were not palpable/normal  Psychiatric: awake, alert and oriented  Neuro: normal sensation  MSK F. Tender and swollen over the extensor tendons.  There is painful ulnar deviation with normal range of motion in the wrist.  There is weakness and ulnar deviation the wrist is stable skin is intact.  Ulcer and temperature are normal no lymphadenopathy sensation is normal no pathologic reflexes.  Coordination and balance are normal  Imaging: no  Assessment: De Quervain's syndrome    Plan: Ice, Aspercreme and splinting for 6 weeks

## 2011-04-16 ENCOUNTER — Other Ambulatory Visit (HOSPITAL_COMMUNITY): Payer: Self-pay | Admitting: Pulmonary Disease

## 2011-04-16 DIAGNOSIS — N200 Calculus of kidney: Secondary | ICD-10-CM

## 2011-04-17 ENCOUNTER — Ambulatory Visit (HOSPITAL_COMMUNITY)
Admission: RE | Admit: 2011-04-17 | Discharge: 2011-04-17 | Disposition: A | Discharge status: Home / Self Care | Payer: Medicare Other | Source: Ambulatory Visit | Attending: Pulmonary Disease | Admitting: Pulmonary Disease

## 2011-04-17 DIAGNOSIS — K7689 Other specified diseases of liver: Secondary | ICD-10-CM | POA: Insufficient documentation

## 2011-04-17 DIAGNOSIS — K5730 Diverticulosis of large intestine without perforation or abscess without bleeding: Secondary | ICD-10-CM | POA: Insufficient documentation

## 2011-04-17 DIAGNOSIS — N200 Calculus of kidney: Secondary | ICD-10-CM

## 2011-04-17 DIAGNOSIS — R1031 Right lower quadrant pain: Secondary | ICD-10-CM | POA: Insufficient documentation

## 2011-05-21 ENCOUNTER — Ambulatory Visit (INDEPENDENT_AMBULATORY_CARE_PROVIDER_SITE_OTHER): Payer: Medicare Other | Admitting: Internal Medicine

## 2011-05-21 ENCOUNTER — Encounter (INDEPENDENT_AMBULATORY_CARE_PROVIDER_SITE_OTHER): Payer: Self-pay | Admitting: Internal Medicine

## 2011-05-21 DIAGNOSIS — J40 Bronchitis, not specified as acute or chronic: Secondary | ICD-10-CM | POA: Diagnosis not present

## 2011-05-21 DIAGNOSIS — R109 Unspecified abdominal pain: Secondary | ICD-10-CM

## 2011-05-21 DIAGNOSIS — K769 Liver disease, unspecified: Secondary | ICD-10-CM | POA: Diagnosis not present

## 2011-05-21 DIAGNOSIS — I729 Aneurysm of unspecified site: Secondary | ICD-10-CM

## 2011-05-21 DIAGNOSIS — R160 Hepatomegaly, not elsewhere classified: Secondary | ICD-10-CM

## 2011-05-21 MED ORDER — AZITHROMYCIN 250 MG PO TABS: ORAL_TABLET | ORAL | Status: AC

## 2011-05-21 NOTE — Patient Instructions (Signed)
Be careful how you lift granddaughter. Will call an Rx for Zithromax for her bronchitis which will treat empirically. Continue present medications.

## 2011-05-21 NOTE — Progress Notes (Signed)
Subjective:     Patient ID: [name], female   DOB: December 31, 1935, 76 y.o.   MRN: [id number]  HPIBetty is a 76 yr old female presenting today with c/o rt upper quadrant pain radiating into back for x 2 months. She actually says she has had this pain for years.  She underwent a virtual colonoscopy in January of t his year which revealed the lung bases were clear. The liver was unremarkable in the unenhanced state. Pancreas was normal in size. Marland KitchenPancreatic duct was not dilated. The stomach  was not well distended. The adrenal glad unremarkable.  Probable cyst in the upper mid posterior left kidney. Appetite is fair, though she has not lost any weight.No acid reflux.  She says she occasionally has dysphagia since the weekend with her cough.  Patient rt upper quadrant radiating into back.  When she lays on left side it feels like something pulling in her back.  She has a BM daily. No melena or rectal bleeding. She does occasionally have loose stools. She does admit that her pain is worse after lifting her grandchild.  She also c/o deep cough which started this weekend. She is coughing thick mucous with occasionally blood. No fever.  Review of Systems see hpi Current Outpatient Prescriptions  Medication Sig Dispense Refill  . aspirin 81 MG tablet Take 81 mg by mouth daily.        . carvedilol (COREG) 12.5 MG tablet Take 6.25 mg by mouth 2 (two) times daily with a meal.        . clorazepate (TRANXENE) 7.5 MG tablet Take 7.5 mg by mouth as needed.        . dicyclomine (BENTYL) 10 MG capsule Take 10 mg by mouth 2 (two) times daily.        Marland Kitchen lisinopril (PRINIVIL,ZESTRIL) 10 MG tablet Take 10 mg by mouth daily.        . predniSONE (DELTASONE) 10 MG tablet Take 10 mg by mouth as needed.        . traMADol (ULTRAM) 50 MG tablet Take 50 mg by mouth as needed. Maximum dose= 8 tablets per day       . trimethoprim (TRIMPEX) 100 MG tablet Take 100 mg by mouth at bedtime.         Past Medical History  Diagnosis  Date  . Aneurysm   . Liver mass   . Lung, cysts, congenital     left lung cyst  . Kidney stone     embedded  . Hemorrhoids    Past Surgical History  Procedure Date  . Tonsillectomy and adenoidectomy   . Complete hysterectomy   . Benign breast tumors   . Cholecystectomy   . Colonoscopy    Allergies  Allergen Reactions  . Percodan (Oxycodone-Aspirin)    History   Social History  . Marital Status: Widowed    Spouse Name: N/A    Number of Children: N/A  . Years of Education: 9th   Occupational History  .     Social History Main Topics  . Smoking status: Never Smoker   . Smokeless tobacco: Not on file  . Alcohol Use: No  . Drug Use: No  . Sexually Active: Not on file   Other Topics Concern  . Not on file   Social History Narrative  . No narrative on file   Family Status  Relation Status Death Age  . Mother Deceased     MI  . Father Deceased  Brain aneurysm  . Sister Deceased     2 deceased from MI and one ?uknown.  . Brother Deceased     Oned deceased lung cancer, once at birth, one from MI, one from DM.    Objective:   Physical Exam  Allergies  Allergen Reactions  . Percodan (Oxycodone-Aspirin)     Alert and oriented. Skin warm and dry. Oral mucosa is moist.   . Sclera anicteric, conjunctivae is pink. Thyroid not enlarged. No cervical lymphadenopathy.Bilateral wheezes. Heart regular rate and rhythm.  Abdomen is soft. Bowel sounds are positive. No hepatomegaly. No abdominal masses felt. No tenderness.  No edema to lower extremities. Patient is alert and oriented.    Assessment:    Rt upper quadrant pain probably musculoskeltal.   Bronchitis     Plan:    Continue present medications.Take Tramadol as needed.  Will call an Rx in for Zithromax for her bronchitis.

## 2011-05-23 ENCOUNTER — Encounter (HOSPITAL_COMMUNITY): Payer: Self-pay | Admitting: *Deleted

## 2011-05-23 ENCOUNTER — Emergency Department (HOSPITAL_COMMUNITY)
Admission: EM | Admit: 2011-05-23 | Discharge: 2011-05-23 | Disposition: A | Discharge status: Home / Self Care | Payer: Medicare Other | Attending: Emergency Medicine | Admitting: Emergency Medicine

## 2011-05-23 ENCOUNTER — Emergency Department (HOSPITAL_COMMUNITY): Payer: Medicare Other

## 2011-05-23 DIAGNOSIS — F411 Generalized anxiety disorder: Secondary | ICD-10-CM | POA: Insufficient documentation

## 2011-05-23 DIAGNOSIS — R059 Cough, unspecified: Secondary | ICD-10-CM | POA: Insufficient documentation

## 2011-05-23 DIAGNOSIS — R05 Cough: Secondary | ICD-10-CM | POA: Insufficient documentation

## 2011-05-23 DIAGNOSIS — R5383 Other fatigue: Secondary | ICD-10-CM | POA: Diagnosis not present

## 2011-05-23 DIAGNOSIS — N390 Urinary tract infection, site not specified: Secondary | ICD-10-CM | POA: Diagnosis not present

## 2011-05-23 DIAGNOSIS — Z9079 Acquired absence of other genital organ(s): Secondary | ICD-10-CM | POA: Insufficient documentation

## 2011-05-23 DIAGNOSIS — Q330 Congenital cystic lung: Secondary | ICD-10-CM | POA: Diagnosis not present

## 2011-05-23 DIAGNOSIS — R5381 Other malaise: Secondary | ICD-10-CM | POA: Insufficient documentation

## 2011-05-23 DIAGNOSIS — Z7982 Long term (current) use of aspirin: Secondary | ICD-10-CM | POA: Diagnosis not present

## 2011-05-23 DIAGNOSIS — J438 Other emphysema: Secondary | ICD-10-CM | POA: Diagnosis not present

## 2011-05-23 DIAGNOSIS — Z87891 Personal history of nicotine dependence: Secondary | ICD-10-CM | POA: Diagnosis not present

## 2011-05-23 DIAGNOSIS — R110 Nausea: Secondary | ICD-10-CM | POA: Diagnosis not present

## 2011-05-23 DIAGNOSIS — R404 Transient alteration of awareness: Secondary | ICD-10-CM | POA: Diagnosis not present

## 2011-05-23 DIAGNOSIS — Z87442 Personal history of urinary calculi: Secondary | ICD-10-CM | POA: Insufficient documentation

## 2011-05-23 HISTORY — DX: Anxiety disorder, unspecified: F41.9

## 2011-05-23 LAB — URINALYSIS, ROUTINE W REFLEX MICROSCOPIC
Bilirubin Urine: NEGATIVE
Glucose, UA: NEGATIVE mg/dL
Ketones, ur: NEGATIVE mg/dL
Nitrite: POSITIVE — AB
Protein, ur: NEGATIVE mg/dL
Specific Gravity, Urine: <1.005 — ABNORMAL LOW (ref 1.005–1.030)
Urobilinogen, UA: 0.2 mg/dL (ref 0.0–1.0)
pH: 6 (ref 5.0–8.0)

## 2011-05-23 LAB — COMPREHENSIVE METABOLIC PANEL
ALT: 12 U/L (ref 0–35)
AST: 17 U/L (ref 0–37)
Albumin: 4.1 g/dL (ref 3.5–5.2)
Alkaline Phosphatase: 63 U/L (ref 39–117)
BUN: 9 mg/dL (ref 6–23)
CO2: 27 mEq/L (ref 19–32)
Calcium: 10.2 mg/dL (ref 8.4–10.5)
Chloride: 103 mEq/L (ref 96–112)
Creatinine, Ser: 0.6 mg/dL (ref 0.50–1.10)
GFR calc Af Amer: >90 mL/min (ref >90)
GFR calc non Af Amer: 88 mL/min — ABNORMAL LOW (ref >90)
Glucose, Bld: 120 mg/dL — ABNORMAL HIGH (ref 70–99)
Potassium: 3.8 mEq/L (ref 3.5–5.1)
Sodium: 138 mEq/L (ref 135–145)
Total Bilirubin: 0.4 mg/dL (ref 0.3–1.2)
Total Protein: 7.2 g/dL (ref 6.0–8.3)

## 2011-05-23 LAB — DIFFERENTIAL
Basophils Absolute: 0 10*3/uL (ref 0.0–0.1)
Basophils Relative: 0 % (ref 0–1)
Eosinophils Absolute: 0.1 10*3/uL (ref 0.0–0.7)
Eosinophils Relative: 2 % (ref 0–5)
Lymphocytes Relative: 29 % (ref 12–46)
Lymphs Abs: 1.7 10*3/uL (ref 0.7–4.0)
Monocytes Absolute: 0.4 10*3/uL (ref 0.1–1.0)
Monocytes Relative: 6 % (ref 3–12)
Neutro Abs: 3.7 10*3/uL (ref 1.7–7.7)
Neutrophils Relative %: 63 % (ref 43–77)

## 2011-05-23 LAB — CBC
HCT: 40.7 % (ref 36.0–46.0)
Hemoglobin: 13.3 g/dL (ref 12.0–15.0)
MCH: 27.7 pg (ref 26.0–34.0)
MCHC: 32.7 g/dL (ref 30.0–36.0)
MCV: 84.6 fL (ref 78.0–100.0)
Platelets: 225 10*3/uL (ref 150–400)
RBC: 4.81 MIL/uL (ref 3.87–5.11)
RDW: 12.9 % (ref 11.5–15.5)
WBC: 5.9 10*3/uL (ref 4.0–10.5)

## 2011-05-23 LAB — URINE MICROSCOPIC-ADD ON

## 2011-05-23 LAB — URINE CULTURE
Colony Count: >=100000
Culture  Setup Time: 201301100459

## 2011-05-23 MED ORDER — SODIUM CHLORIDE 0.9 % IV SOLN: Freq: Once | INTRAVENOUS | Status: DC

## 2011-05-23 MED ORDER — DEXTROSE 5 % IV SOLN
1.0000 g | INTRAVENOUS | Status: DC
  Administered 2011-05-23: 1 g via INTRAVENOUS
  Filled 2011-05-23: qty 10

## 2011-05-23 MED ORDER — CEPHALEXIN 500 MG PO CAPS: 500.0000 mg | ORAL_CAPSULE | Freq: Four times a day (QID) | ORAL | Status: AC

## 2011-05-23 NOTE — ED Provider Notes (Signed)
History     CSN: [id number]  Arrival date & time 05/23/11  1338   First MD Initiated Contact with Patient 05/23/11 1354      Chief Complaint  Patient presents with  . Weakness    (Consider location/radiation/quality/duration/timing/severity/associated sxs/prior treatment) Patient is a 76 y.o. female presenting with weakness. The history is provided by the patient.  Weakness  Additional symptoms include weakness.   patient presents with three-day history of diffuse body weakness associated with a productive cough. No vomiting or diarrhea. Cough is productive of green sputum. Called her primary care physician was placed on Zithromax without improvement of her symptoms. Does note some dysuria and she does have a history of UTIs. No other medications taken prior to arrival. Nothing makes her symptoms better or worse. Denies any neck pain, photophobia, rashes  Past Medical History  Diagnosis Date  . Aneurysm   . Liver mass   . Lung, cysts, congenital     left lung cyst  . Kidney stone     embedded  . Hemorrhoids   . Anxiety     Past Surgical History  Procedure Date  . Tonsillectomy and adenoidectomy   . Complete hysterectomy   . Benign breast tumors   . Cholecystectomy   . Colonoscopy     Family History  Problem Relation Age of Onset  . Heart disease    . Arthritis    . Lung disease    . Cancer    . Asthma    . Diabetes    . Kidney disease      History  Substance Use Topics  . Smoking status: Former Games developer  . Smokeless tobacco: Not on file  . Alcohol Use: No    OB History    Grav Para Term Preterm Abortions TAB SAB Ect Mult Living                  Review of Systems  Neurological: Positive for weakness.  All other systems reviewed and are negative.    Allergies  Percodan and Valium  Home Medications   Current Outpatient Rx  Name Route Sig Dispense Refill  . ASPIRIN 81 MG PO TABS Oral Take 81 mg by mouth daily.      . AZITHROMYCIN 250 MG PO TABS   Take 2 tablets (500 mg) on  Day 1,  followed by 1 tablet (250 mg) once daily on Days 2 through 5. 6 each 0  . CARVEDILOL 12.5 MG PO TABS Oral Take 6.25 mg by mouth 2 (two) times daily with a meal.      . CLORAZEPATE DIPOTASSIUM 7.5 MG PO TABS Oral Take 7.5 mg by mouth as needed.      Marland Kitchen DICYCLOMINE HCL 10 MG PO CAPS Oral Take 10 mg by mouth 2 (two) times daily.      Marland Kitchen LISINOPRIL 10 MG PO TABS Oral Take 10 mg by mouth daily.      Marland Kitchen PREDNISONE 10 MG PO TABS Oral Take 10 mg by mouth as needed.      Marland Kitchen TRAMADOL HCL 50 MG PO TABS Oral Take 50 mg by mouth as needed. Maximum dose= 8 tablets per day     . TRIMETHOPRIM 100 MG PO TABS Oral Take 100 mg by mouth at bedtime.        BP 152/66  Pulse 80  Temp(Src) 97.8 F (36.6 C) (Oral)  Resp 20  Ht 5\' 2"  (1.575 m)  Wt 180 lb (81.647 kg)  BMI  32.92 kg/m2  SpO2 96%  Physical Exam  Nursing note and vitals reviewed. Constitutional: She is oriented to person, place, and time. She appears well-developed and well-nourished.  Non-toxic appearance. No distress.  HENT:  Head: Normocephalic and atraumatic.  Eyes: Conjunctivae, EOM and lids are normal. Pupils are equal, round, and reactive to light.  Neck: Normal range of motion. Neck supple. No tracheal deviation present. No mass present.  Cardiovascular: Normal rate, regular rhythm and normal heart sounds.  Exam reveals no gallop.   No murmur heard. Pulmonary/Chest: Effort normal. No stridor. Not tachypneic. No respiratory distress. She has decreased breath sounds. She has no wheezes. She has no rhonchi. She has no rales.  Abdominal: Soft. Normal appearance and bowel sounds are normal. She exhibits no distension. There is no tenderness. There is no rebound and no CVA tenderness.  Musculoskeletal: Normal range of motion. She exhibits no edema and no tenderness.  Neurological: She is alert and oriented to person, place, and time. She has normal strength. No cranial nerve deficit or sensory deficit. GCS eye  subscore is 4. GCS verbal subscore is 5. GCS motor subscore is 6.  Skin: Skin is warm and dry. No abrasion and no rash noted.  Psychiatric: She has a normal mood and affect. Her speech is normal and behavior is normal.    ED Course  Procedures (including critical care time)   Labs Reviewed  CBC  DIFFERENTIAL  COMPREHENSIVE METABOLIC PANEL  URINALYSIS, ROUTINE W REFLEX MICROSCOPIC  URINE CULTURE   No results found.   No diagnosis found.    MDM  Patient given antibiotics for her urinary tract infection. Will prescribe medications for same        Toy Baker, MD 05/23/11 1704

## 2011-05-23 NOTE — ED Notes (Signed)
C/o gen. Weakness for couple of days, has seen PCP- [name] and was started on Zithromax, has not taken meds today per report

## 2011-05-28 DIAGNOSIS — N3020 Other chronic cystitis without hematuria: Secondary | ICD-10-CM | POA: Diagnosis not present

## 2011-05-28 DIAGNOSIS — N3941 Urge incontinence: Secondary | ICD-10-CM | POA: Diagnosis not present

## 2011-05-28 DIAGNOSIS — N318 Other neuromuscular dysfunction of bladder: Secondary | ICD-10-CM | POA: Diagnosis not present

## 2011-05-28 DIAGNOSIS — N281 Cyst of kidney, acquired: Secondary | ICD-10-CM | POA: Diagnosis not present

## 2011-05-28 NOTE — ED Notes (Signed)
+   Urine Patient treated with Keflex-sensitive to same-chart appended per protocol MD. 

## 2011-07-30 DIAGNOSIS — N3020 Other chronic cystitis without hematuria: Secondary | ICD-10-CM | POA: Diagnosis not present

## 2011-07-30 DIAGNOSIS — N281 Cyst of kidney, acquired: Secondary | ICD-10-CM | POA: Diagnosis not present

## 2011-08-08 DIAGNOSIS — R21 Rash and other nonspecific skin eruption: Secondary | ICD-10-CM | POA: Diagnosis not present

## 2011-08-08 DIAGNOSIS — I509 Heart failure, unspecified: Secondary | ICD-10-CM | POA: Diagnosis not present

## 2011-08-08 DIAGNOSIS — N390 Urinary tract infection, site not specified: Secondary | ICD-10-CM | POA: Diagnosis not present

## 2011-08-09 ENCOUNTER — Other Ambulatory Visit: Payer: Self-pay

## 2011-08-09 DIAGNOSIS — R0602 Shortness of breath: Secondary | ICD-10-CM

## 2011-08-15 ENCOUNTER — Encounter (HOSPITAL_COMMUNITY): Payer: Medicare Other

## 2011-08-22 ENCOUNTER — Ambulatory Visit (HOSPITAL_COMMUNITY)
Admission: RE | Admit: 2011-08-22 | Discharge: 2011-08-22 | Disposition: A | Discharge status: Home / Self Care | Payer: Medicare Other | Source: Ambulatory Visit | Attending: Pulmonary Disease | Admitting: Pulmonary Disease

## 2011-08-22 DIAGNOSIS — R0602 Shortness of breath: Secondary | ICD-10-CM | POA: Diagnosis not present

## 2011-08-22 MED ORDER — ALBUTEROL SULFATE (5 MG/ML) 0.5% IN NEBU
2.5000 mg | INHALATION_SOLUTION | Freq: Once | RESPIRATORY_TRACT | Status: AC
  Administered 2011-08-22: 2.5 mg via RESPIRATORY_TRACT

## 2011-08-26 NOTE — Procedures (Signed)
[name], [name]                ACCOUNT NO.:  [id number]  MEDICAL RECORD NO.:  [id number]  LOCATION:  RESP                          FACILITY:  [facility]  PHYSICIAN:  Kyana Aicher L. Juanetta Gosling, M.D.DATE OF BIRTH:  10-25-1935  DATE OF PROCEDURE: DATE OF DISCHARGE:  08/22/2011                           PULMONARY FUNCTION TEST   Reason for pulmonary function testing is shortness of breath. 1. Spirometry shows a mild ventilatory defect with airflow obstruction     most marked in the smaller airways. 2. Lung volumes show air trapping. 3. DLCO is moderately reduced. 4. Airway resistance is slightly high. 5. There is no significant bronchodilator improvement.     Kaytlin Burklow L. Juanetta Gosling, M.D.     ELH/MEDQ  D:  08/25/2011  T:  08/26/2011  Job:  [id number]

## 2011-08-31 LAB — PULMONARY FUNCTION TEST

## 2011-09-04 NOTE — Procedures (Signed)
NAMEJOCI, DRESS                ACCOUNT NO.:  [id number]  MEDICAL RECORD NO.:  [id number]  LOCATION:  RESP                          FACILITY:  [facility]  PHYSICIAN:  Lannie Yusuf L. Juanetta Gosling, M.D.DATE OF BIRTH:  1935-11-07  DATE OF PROCEDURE: DATE OF DISCHARGE:  08/22/2011                           PULMONARY FUNCTION TEST   Reason for pulmonary function testing is shortness of breath. 1. Spirometry shows a mild ventilatory defect with airflow obstruction     at the level of the smaller airways. 2. Lung volumes show air trapping. 3. DLCO is mildly to moderately reduced. 4. Airway resistance is slightly increased. 5. There is no bronchodilator improved. 6. This is consistent with probable asthma/COPD.     Enora Trillo L. Juanetta Gosling, M.D.     ELH/MEDQ  D:  09/04/2011  T:  09/04/2011  Job:  [id number]

## 2011-09-11 DIAGNOSIS — Z79899 Other long term (current) drug therapy: Secondary | ICD-10-CM | POA: Diagnosis not present

## 2011-09-11 DIAGNOSIS — L259 Unspecified contact dermatitis, unspecified cause: Secondary | ICD-10-CM | POA: Diagnosis not present

## 2011-09-19 DIAGNOSIS — L259 Unspecified contact dermatitis, unspecified cause: Secondary | ICD-10-CM | POA: Diagnosis not present

## 2011-11-14 DIAGNOSIS — J449 Chronic obstructive pulmonary disease, unspecified: Secondary | ICD-10-CM | POA: Diagnosis not present

## 2011-11-14 DIAGNOSIS — J440 Chronic obstructive pulmonary disease with acute lower respiratory infection: Secondary | ICD-10-CM | POA: Diagnosis not present

## 2011-11-14 DIAGNOSIS — J45901 Unspecified asthma with (acute) exacerbation: Secondary | ICD-10-CM | POA: Diagnosis not present

## 2011-11-14 DIAGNOSIS — J441 Chronic obstructive pulmonary disease with (acute) exacerbation: Secondary | ICD-10-CM | POA: Diagnosis not present

## 2011-12-07 DIAGNOSIS — N281 Cyst of kidney, acquired: Secondary | ICD-10-CM | POA: Diagnosis not present

## 2011-12-07 DIAGNOSIS — N318 Other neuromuscular dysfunction of bladder: Secondary | ICD-10-CM | POA: Diagnosis not present

## 2011-12-27 DIAGNOSIS — I10 Essential (primary) hypertension: Secondary | ICD-10-CM | POA: Diagnosis not present

## 2011-12-27 DIAGNOSIS — I428 Other cardiomyopathies: Secondary | ICD-10-CM | POA: Diagnosis not present

## 2011-12-27 DIAGNOSIS — E782 Mixed hyperlipidemia: Secondary | ICD-10-CM | POA: Diagnosis not present

## 2011-12-27 DIAGNOSIS — R9431 Abnormal electrocardiogram [ECG] [EKG]: Secondary | ICD-10-CM | POA: Diagnosis not present

## 2012-01-09 DIAGNOSIS — N390 Urinary tract infection, site not specified: Secondary | ICD-10-CM | POA: Diagnosis not present

## 2012-01-17 DIAGNOSIS — N390 Urinary tract infection, site not specified: Secondary | ICD-10-CM | POA: Diagnosis not present

## 2012-01-17 DIAGNOSIS — R079 Chest pain, unspecified: Secondary | ICD-10-CM | POA: Diagnosis not present

## 2012-01-17 DIAGNOSIS — J440 Chronic obstructive pulmonary disease with acute lower respiratory infection: Secondary | ICD-10-CM | POA: Diagnosis not present

## 2012-01-17 DIAGNOSIS — R1084 Generalized abdominal pain: Secondary | ICD-10-CM | POA: Diagnosis not present

## 2012-01-23 DIAGNOSIS — N390 Urinary tract infection, site not specified: Secondary | ICD-10-CM | POA: Diagnosis not present

## 2012-04-16 ENCOUNTER — Other Ambulatory Visit (HOSPITAL_COMMUNITY): Payer: Self-pay | Admitting: Pulmonary Disease

## 2012-04-16 DIAGNOSIS — R079 Chest pain, unspecified: Secondary | ICD-10-CM | POA: Diagnosis not present

## 2012-04-16 DIAGNOSIS — J449 Chronic obstructive pulmonary disease, unspecified: Secondary | ICD-10-CM | POA: Diagnosis not present

## 2012-04-16 DIAGNOSIS — I10 Essential (primary) hypertension: Secondary | ICD-10-CM | POA: Diagnosis not present

## 2012-04-16 DIAGNOSIS — Z23 Encounter for immunization: Secondary | ICD-10-CM | POA: Diagnosis not present

## 2012-04-16 DIAGNOSIS — F411 Generalized anxiety disorder: Secondary | ICD-10-CM | POA: Diagnosis not present

## 2012-04-18 ENCOUNTER — Ambulatory Visit (HOSPITAL_COMMUNITY): Payer: Medicare Other

## 2012-04-21 ENCOUNTER — Ambulatory Visit (HOSPITAL_COMMUNITY)
Admission: RE | Admit: 2012-04-21 | Discharge: 2012-04-21 | Disposition: A | Discharge status: Home / Self Care | Payer: Medicare Other | Source: Ambulatory Visit | Attending: Pulmonary Disease | Admitting: Pulmonary Disease

## 2012-04-21 ENCOUNTER — Other Ambulatory Visit (HOSPITAL_COMMUNITY): Payer: Medicare Other

## 2012-04-21 DIAGNOSIS — R079 Chest pain, unspecified: Secondary | ICD-10-CM

## 2012-04-21 DIAGNOSIS — I2510 Atherosclerotic heart disease of native coronary artery without angina pectoris: Secondary | ICD-10-CM | POA: Insufficient documentation

## 2012-04-21 MED ORDER — IOHEXOL 300 MG/ML  SOLN
80.0000 mL | Freq: Once | INTRAMUSCULAR | Status: AC | PRN
  Administered 2012-04-21: 80 mL via INTRAVENOUS

## 2012-04-22 ENCOUNTER — Ambulatory Visit (HOSPITAL_COMMUNITY): Payer: Medicare Other

## 2012-06-07 ENCOUNTER — Encounter (HOSPITAL_COMMUNITY): Payer: Self-pay | Admitting: Emergency Medicine

## 2012-06-07 ENCOUNTER — Emergency Department (HOSPITAL_COMMUNITY)
Admission: EM | Admit: 2012-06-07 | Discharge: 2012-06-07 | Disposition: A | Discharge status: Home / Self Care | Payer: Medicare Other | Attending: Emergency Medicine | Admitting: Emergency Medicine

## 2012-06-07 DIAGNOSIS — Z87891 Personal history of nicotine dependence: Secondary | ICD-10-CM | POA: Diagnosis not present

## 2012-06-07 DIAGNOSIS — Z792 Long term (current) use of antibiotics: Secondary | ICD-10-CM | POA: Diagnosis not present

## 2012-06-07 DIAGNOSIS — Z8679 Personal history of other diseases of the circulatory system: Secondary | ICD-10-CM | POA: Diagnosis not present

## 2012-06-07 DIAGNOSIS — F411 Generalized anxiety disorder: Secondary | ICD-10-CM | POA: Insufficient documentation

## 2012-06-07 DIAGNOSIS — R350 Frequency of micturition: Secondary | ICD-10-CM | POA: Insufficient documentation

## 2012-06-07 DIAGNOSIS — R55 Syncope and collapse: Secondary | ICD-10-CM | POA: Diagnosis not present

## 2012-06-07 DIAGNOSIS — R110 Nausea: Secondary | ICD-10-CM | POA: Insufficient documentation

## 2012-06-07 DIAGNOSIS — Z87442 Personal history of urinary calculi: Secondary | ICD-10-CM | POA: Insufficient documentation

## 2012-06-07 DIAGNOSIS — Z7982 Long term (current) use of aspirin: Secondary | ICD-10-CM | POA: Diagnosis not present

## 2012-06-07 DIAGNOSIS — N23 Unspecified renal colic: Secondary | ICD-10-CM | POA: Insufficient documentation

## 2012-06-07 DIAGNOSIS — Z8775 Personal history of (corrected) congenital malformations of respiratory system: Secondary | ICD-10-CM | POA: Insufficient documentation

## 2012-06-07 DIAGNOSIS — N390 Urinary tract infection, site not specified: Secondary | ICD-10-CM | POA: Insufficient documentation

## 2012-06-07 DIAGNOSIS — Z79899 Other long term (current) drug therapy: Secondary | ICD-10-CM | POA: Diagnosis not present

## 2012-06-07 DIAGNOSIS — R1013 Epigastric pain: Secondary | ICD-10-CM | POA: Insufficient documentation

## 2012-06-07 DIAGNOSIS — R42 Dizziness and giddiness: Secondary | ICD-10-CM | POA: Diagnosis not present

## 2012-06-07 DIAGNOSIS — R6889 Other general symptoms and signs: Secondary | ICD-10-CM | POA: Diagnosis not present

## 2012-06-07 DIAGNOSIS — R404 Transient alteration of awareness: Secondary | ICD-10-CM | POA: Diagnosis not present

## 2012-06-07 LAB — CBC WITH DIFFERENTIAL/PLATELET
Basophils Absolute: 0 10*3/uL (ref 0.0–0.1)
Basophils Relative: 0 % (ref 0–1)
Eosinophils Absolute: 0.2 10*3/uL (ref 0.0–0.7)
Eosinophils Relative: 5 % (ref 0–5)
HCT: 42.4 % (ref 36.0–46.0)
Hemoglobin: 14.3 g/dL (ref 12.0–15.0)
Lymphocytes Relative: 39 % (ref 12–46)
Lymphs Abs: 2.1 10*3/uL (ref 0.7–4.0)
MCH: 28 pg (ref 26.0–34.0)
MCHC: 33.7 g/dL (ref 30.0–36.0)
MCV: 83.1 fL (ref 78.0–100.0)
Monocytes Absolute: 0.5 10*3/uL (ref 0.1–1.0)
Monocytes Relative: 10 % (ref 3–12)
Neutro Abs: 2.5 10*3/uL (ref 1.7–7.7)
Neutrophils Relative %: 46 % (ref 43–77)
Platelets: 238 10*3/uL (ref 150–400)
RBC: 5.1 MIL/uL (ref 3.87–5.11)
RDW: 13.4 % (ref 11.5–15.5)
WBC: 5.3 10*3/uL (ref 4.0–10.5)

## 2012-06-07 LAB — URINE MICROSCOPIC-ADD ON

## 2012-06-07 LAB — BASIC METABOLIC PANEL
BUN: 10 mg/dL (ref 6–23)
CO2: 26 mEq/L (ref 19–32)
Calcium: 9.9 mg/dL (ref 8.4–10.5)
Chloride: 102 mEq/L (ref 96–112)
Creatinine, Ser: 0.78 mg/dL (ref 0.50–1.10)
GFR calc Af Amer: >90 mL/min (ref >90)
GFR calc non Af Amer: 80 mL/min — ABNORMAL LOW (ref >90)
Glucose, Bld: 147 mg/dL — ABNORMAL HIGH (ref 70–99)
Potassium: 3.9 mEq/L (ref 3.5–5.1)
Sodium: 137 mEq/L (ref 135–145)

## 2012-06-07 LAB — URINALYSIS, ROUTINE W REFLEX MICROSCOPIC
Bilirubin Urine: NEGATIVE
Glucose, UA: NEGATIVE mg/dL
Ketones, ur: NEGATIVE mg/dL
Nitrite: POSITIVE — AB
Protein, ur: 30 mg/dL — AB
Specific Gravity, Urine: 1.025 (ref 1.005–1.030)
Urobilinogen, UA: 0.2 mg/dL (ref 0.0–1.0)
pH: 6 (ref 5.0–8.0)

## 2012-06-07 MED ORDER — CEFTRIAXONE SODIUM 1 G IJ SOLR
1.0000 g | Freq: Once | INTRAMUSCULAR | Status: AC
  Administered 2012-06-07: 1 g via INTRAVENOUS
  Filled 2012-06-07: qty 10

## 2012-06-07 MED ORDER — SODIUM CHLORIDE 0.9 % IV BOLUS (SEPSIS)
1000.0000 mL | Freq: Once | INTRAVENOUS | Status: AC
  Administered 2012-06-07: 1000 mL via INTRAVENOUS

## 2012-06-07 MED ORDER — CEPHALEXIN 500 MG PO CAPS: 500.0000 mg | ORAL_CAPSULE | Freq: Four times a day (QID) | ORAL | Status: DC

## 2012-06-07 NOTE — ED Notes (Signed)
Patient ambulated to restroom with steady gait and without difficulty.

## 2012-06-07 NOTE — ED Notes (Signed)
Patient with c/o dizziness upon standing this morning. H/o UTI with similar symptoms. Patient alert/oriented x 4.

## 2012-06-07 NOTE — ED Notes (Addendum)
Patient now stating that both arms feel heavy and "I can't use them like I normally do" Report symptoms started 0800 today. Also reports burning pain in chest.

## 2012-06-07 NOTE — ED Notes (Signed)
MD at bedside. 

## 2012-06-07 NOTE — ED Notes (Signed)
Patient with no complaints at this time. Respirations even and unlabored. Skin warm/dry. Discharge instructions reviewed with patient at this time. Patient given opportunity to voice concerns/ask questions. IV removed per policy and band-aid applied to site. Patient discharged at this time and left Emergency Department via wheelchair to wait for ride.

## 2012-06-07 NOTE — ED Provider Notes (Addendum)
History   This chart was scribed for Gwyneth Sprout, MD by Toya Smothers, ED Scribe. The patient was seen in room APA02/APA02. Patient's care was started at 1022.  CSN: [id number]  Arrival date & time 06/07/12  1022   First MD Initiated Contact with Patient 06/07/12 1028      Chief Complaint  Patient presents with  . Dizziness    Patient is a 77 y.o. female presenting with abdominal pain. The history is provided by the patient. No language interpreter was used.  Abdominal Pain The primary symptoms of the illness include abdominal pain and nausea. The current episode started 2 days ago. The onset of the illness was gradual. The problem has not changed since onset. The abdominal pain has been unchanged since its onset. The abdominal pain is located in the epigastric region.  Additional symptoms associated with the illness include frequency.    [name] is a 77 y.o. female who presents to the Emergency Department complaining of 2 days of new, sudden onset, unchanged, moderate dizziness upon standing, with associate nausea, epigastric pain, and left kidney pain. Pain is worse in certain positions, while alleviated by nothing. Pt describes symptoms similar to h/o recurrent UTI. She is currently taking Trimethoprim daily to prevent recurrent UTI. Symptoms have not been treated PTA. No fever, chills, cough, congestion, rhinorrhea, chest pain, SOB, or diarrhea. Pt is a former smoker denying alcohol and use of illicit drugs.    Past Medical History  Diagnosis Date  . Aneurysm   . Liver mass   . Lung, cysts, congenital     left lung cyst  . Kidney stone     embedded  . Hemorrhoids   . Anxiety     Past Surgical History  Procedure Date  . Tonsillectomy and adenoidectomy   . Complete hysterectomy   . Benign breast tumors   . Cholecystectomy   . Colonoscopy     Family History  Problem Relation Age of Onset  . Heart disease    . Arthritis    . Lung disease    . Cancer    .  Asthma    . Diabetes    . Kidney disease      History  Substance Use Topics  . Smoking status: Former Games developer  . Smokeless tobacco: Not on file  . Alcohol Use: No    Review of Systems  Gastrointestinal: Positive for nausea and abdominal pain.  Genitourinary: Positive for frequency.  Neurological: Positive for dizziness.  All other systems reviewed and are negative.    Allergies  Alprazolam; Codeine; Percodan; and Valium  Home Medications   Current Outpatient Rx  Name  Route  Sig  Dispense  Refill  . ASPIRIN EC 81 MG PO TBEC   Oral   Take 81 mg by mouth every morning.          Marland Kitchen CARVEDILOL 12.5 MG PO TABS   Oral   Take 6.25 mg by mouth 2 (two) times daily with a meal.           . CLORAZEPATE DIPOTASSIUM 7.5 MG PO TABS   Oral   Take 7.5 mg by mouth as needed. For nerves         . B-12 PO   Oral   Take 1 tablet by mouth daily as needed. For energy         . DICYCLOMINE HCL 10 MG PO CAPS   Oral   Take 10 mg by mouth 2 (two)  times daily.           Marland Kitchen FERROUS SULFATE 325 (65 FE) MG PO TABS   Oral   Take 325 mg by mouth daily with breakfast.         . FOLIC ACID 1 MG PO TABS   Oral   Take 1 mg by mouth daily.         Marland Kitchen LISINOPRIL 10 MG PO TABS   Oral   Take 10 mg by mouth daily.           Marland Kitchen PREDNISONE 10 MG PO TABS   Oral   Take 10 mg by mouth as needed. For swelling         . TRAMADOL HCL 50 MG PO TABS   Oral   Take 50 mg by mouth as needed. Maximum dose= 8 tablets per day          . TRIMETHOPRIM 100 MG PO TABS   Oral   Take 100 mg by mouth at bedtime.             BP 165/76  Pulse 86  Temp 98.4 F (36.9 C) (Oral)  Resp 19  Ht 5\' 2"  (1.575 m)  Wt 175 lb (79.379 kg)  BMI 32.01 kg/m2  SpO2 97%  Physical Exam  Nursing note and vitals reviewed. Constitutional: She is oriented to person, place, and time. She appears well-developed and well-nourished. No distress.  HENT:  Head: Normocephalic and atraumatic.  Eyes:  Conjunctivae normal and EOM are normal.  Neck: Neck supple. No tracheal deviation present.  Cardiovascular: Normal rate.   Pulmonary/Chest: Effort normal. No respiratory distress.  Abdominal: She exhibits no distension.       Mild epigastric pain.  Musculoskeletal: Normal range of motion.       Bilateral CVA tenderness.  Neurological: She is alert and oriented to person, place, and time. No sensory deficit.  Skin: Skin is dry.  Psychiatric: She has a normal mood and affect. Her behavior is normal.    ED Course  Procedures DIAGNOSTIC STUDIES: Oxygen Saturation is 97% on room air, normal by my interpretation.    COORDINATION OF CARE: 10:31- Ordered Basic metabolic panel, CBC with Differential, and Urinalysis, Routine w reflex microscopic. 10:38- Evaluated Pt. Pt is awake, alert, and without distress. 10:44- Patient understand and agree with initial ED impression and plan with expectations set for ED visit.    Labs Reviewed  BASIC METABOLIC PANEL - Abnormal; Notable for the following:    Glucose, Bld 147 (*)     GFR calc non Af Amer 80 (*)     All other components within normal limits  URINALYSIS, ROUTINE W REFLEX MICROSCOPIC - Abnormal; Notable for the following:    APPearance HAZY (*)     Hgb urine dipstick TRACE (*)     Protein, ur 30 (*)     Nitrite POSITIVE (*)     Leukocytes, UA SMALL (*)     All other components within normal limits  URINE MICROSCOPIC-ADD ON - Abnormal; Notable for the following:    Squamous Epithelial / LPF MANY (*)     Bacteria, UA MANY (*)     All other components within normal limits  CBC WITH DIFFERENTIAL  URINE CULTURE   No results found.   Date: 06/07/2012  Rate: 85  Rhythm: normal sinus rhythm and premature ventricular contractions (PVC)  QRS Axis: normal  Intervals: normal  ST/T Wave abnormalities: nonspecific ST/T changes  Conduction Disutrbances:left bundle branch  block  Narrative Interpretation:   Old EKG Reviewed:  unchanged   1. UTI (lower urinary tract infection)       MDM   Patient is here complaining of near-syncope with standing as well as flank pain and states she is concerned for another urinary tract infection. Patient has had these symptoms in the past due to urinary tract infections. She denies any chest pain or shortness of breath. She was having some mild epigastric pain that radiates up into her chest that is consistent with GERD.  EKG shows persistent left bundle branch block that is unchanged from prior. Feel most likely patient is experiencing a UTI with systemic symptoms.  Low suspicion for cardiac etiology at this time. Feel most likely patient does have a UTI with systemic symptoms however no symptoms suggestive of Tylenol. She is afebrile but is complaining of mild nausea  Vital signs are stable here Will check orthostatics. CBC, BMP, UA pending.  11:51 AM Labs are normal except for signs of urinary tract infection. Patient does take trimethoprim daily. Will give her IV dose of Rocephin and start her on Keflex. Patient is not orthostatic and states she feels better after fluids.    I personally performed the services described in this documentation, which was scribed in my presence.  The recorded information has been reviewed and considered.    Gwyneth Sprout, MD 06/07/12 1057  Gwyneth Sprout, MD 06/07/12 1151  Gwyneth Sprout, MD 06/07/12 1313

## 2012-06-07 NOTE — ED Notes (Signed)
Patient provided iced water per request.

## 2012-06-10 LAB — URINE CULTURE: Colony Count: >=100000

## 2012-06-11 NOTE — ED Notes (Signed)
+   Urine Patient treated with Keflex-sensitive to same-chart appended per protocol MD. 

## 2012-07-01 ENCOUNTER — Ambulatory Visit (INDEPENDENT_AMBULATORY_CARE_PROVIDER_SITE_OTHER): Payer: Medicare Other | Admitting: Urology

## 2012-07-01 DIAGNOSIS — N318 Other neuromuscular dysfunction of bladder: Secondary | ICD-10-CM

## 2012-07-01 DIAGNOSIS — N3944 Nocturnal enuresis: Secondary | ICD-10-CM | POA: Diagnosis not present

## 2012-07-01 DIAGNOSIS — N281 Cyst of kidney, acquired: Secondary | ICD-10-CM

## 2012-07-01 DIAGNOSIS — N3020 Other chronic cystitis without hematuria: Secondary | ICD-10-CM | POA: Diagnosis not present

## 2012-07-25 ENCOUNTER — Emergency Department (HOSPITAL_COMMUNITY)
Admission: EM | Admit: 2012-07-25 | Discharge: 2012-07-25 | Disposition: A | Discharge status: Home / Self Care | Payer: Medicare Other | Attending: Emergency Medicine | Admitting: Emergency Medicine

## 2012-07-25 ENCOUNTER — Encounter (HOSPITAL_COMMUNITY): Payer: Self-pay | Admitting: *Deleted

## 2012-07-25 ENCOUNTER — Emergency Department (HOSPITAL_COMMUNITY): Payer: Medicare Other

## 2012-07-25 DIAGNOSIS — Z79899 Other long term (current) drug therapy: Secondary | ICD-10-CM | POA: Insufficient documentation

## 2012-07-25 DIAGNOSIS — Z8719 Personal history of other diseases of the digestive system: Secondary | ICD-10-CM | POA: Insufficient documentation

## 2012-07-25 DIAGNOSIS — Z7982 Long term (current) use of aspirin: Secondary | ICD-10-CM | POA: Insufficient documentation

## 2012-07-25 DIAGNOSIS — S8000XA Contusion of unspecified knee, initial encounter: Secondary | ICD-10-CM | POA: Insufficient documentation

## 2012-07-25 DIAGNOSIS — Z87442 Personal history of urinary calculi: Secondary | ICD-10-CM | POA: Insufficient documentation

## 2012-07-25 DIAGNOSIS — S63509A Unspecified sprain of unspecified wrist, initial encounter: Secondary | ICD-10-CM | POA: Diagnosis not present

## 2012-07-25 DIAGNOSIS — M25539 Pain in unspecified wrist: Secondary | ICD-10-CM | POA: Diagnosis not present

## 2012-07-25 DIAGNOSIS — S8990XA Unspecified injury of unspecified lower leg, initial encounter: Secondary | ICD-10-CM | POA: Diagnosis not present

## 2012-07-25 DIAGNOSIS — S59909A Unspecified injury of unspecified elbow, initial encounter: Secondary | ICD-10-CM | POA: Diagnosis not present

## 2012-07-25 DIAGNOSIS — Y939 Activity, unspecified: Secondary | ICD-10-CM | POA: Insufficient documentation

## 2012-07-25 DIAGNOSIS — M25569 Pain in unspecified knee: Secondary | ICD-10-CM | POA: Diagnosis not present

## 2012-07-25 DIAGNOSIS — Y929 Unspecified place or not applicable: Secondary | ICD-10-CM | POA: Insufficient documentation

## 2012-07-25 DIAGNOSIS — Z87891 Personal history of nicotine dependence: Secondary | ICD-10-CM | POA: Diagnosis not present

## 2012-07-25 DIAGNOSIS — F411 Generalized anxiety disorder: Secondary | ICD-10-CM | POA: Insufficient documentation

## 2012-07-25 DIAGNOSIS — S99929A Unspecified injury of unspecified foot, initial encounter: Secondary | ICD-10-CM | POA: Diagnosis not present

## 2012-07-25 DIAGNOSIS — Z8709 Personal history of other diseases of the respiratory system: Secondary | ICD-10-CM | POA: Diagnosis not present

## 2012-07-25 DIAGNOSIS — Z8679 Personal history of other diseases of the circulatory system: Secondary | ICD-10-CM | POA: Insufficient documentation

## 2012-07-25 DIAGNOSIS — W19XXXA Unspecified fall, initial encounter: Secondary | ICD-10-CM

## 2012-07-25 DIAGNOSIS — S8002XA Contusion of left knee, initial encounter: Secondary | ICD-10-CM

## 2012-07-25 DIAGNOSIS — R296 Repeated falls: Secondary | ICD-10-CM | POA: Insufficient documentation

## 2012-07-25 DIAGNOSIS — S6990XA Unspecified injury of unspecified wrist, hand and finger(s), initial encounter: Secondary | ICD-10-CM | POA: Diagnosis not present

## 2012-07-25 DIAGNOSIS — S59919A Unspecified injury of unspecified forearm, initial encounter: Secondary | ICD-10-CM | POA: Diagnosis not present

## 2012-07-25 NOTE — ED Notes (Signed)
Pt states her leg gave out and she fell yesterday evening. Pain to left wrist with swelling. Pain to left knee. Pt states she has been able to ambulate although painful when doing so.

## 2012-07-25 NOTE — ED Provider Notes (Addendum)
History     CSN: [id number]  Arrival date & time 07/25/12  1028   First MD Initiated Contact with Patient 07/25/12 1313      Chief Complaint  Patient presents with  . Knee Pain  . Wrist Pain    (Consider location/radiation/quality/duration/timing/severity/associated sxs/prior treatment) Patient is a 77 y.o. female presenting with knee pain and wrist pain. The history is provided by the patient and a relative.  Knee Pain Associated symptoms: no back pain, no fever and no neck pain   Wrist Pain Pertinent negatives include no chest pain, no abdominal pain, no headaches and no shortness of breath.   patient status post fall yesterday not a syncopal event. Injured left wrist and the left knee. More complained of pain to the left wrist. Then the left knee. Patient able to ambulate since the fall but painful denies any neck back or hip pain. Did not hit her head. The left wrist pain is probably 8/10. Left knee pain probably 4/10. Patient has old injury to her left shoulder with limited range of motion due to that. Pain is nonradiating. Pain is described as throbbing ache and sharp at times.  Past Medical History  Diagnosis Date  . Aneurysm   . Liver mass   . Lung, cysts, congenital     left lung cyst  . Kidney stone     embedded  . Hemorrhoids   . Anxiety     Past Surgical History  Procedure Laterality Date  . Tonsillectomy and adenoidectomy    . Complete hysterectomy    . Benign breast tumors    . Cholecystectomy    . Colonoscopy      Family History  Problem Relation Age of Onset  . Heart disease    . Arthritis    . Lung disease    . Cancer    . Asthma    . Diabetes    . Kidney disease      History  Substance Use Topics  . Smoking status: Former Games developer  . Smokeless tobacco: Not on file  . Alcohol Use: No    OB History   Grav Para Term Preterm Abortions TAB SAB Ect Mult Living                  Review of Systems  Constitutional: Negative for fever.  HENT:  Negative for neck pain.   Eyes: Negative for pain.  Respiratory: Negative for shortness of breath.   Cardiovascular: Negative for chest pain.  Gastrointestinal: Negative for abdominal pain.  Musculoskeletal: Negative for back pain.  Skin: Positive for wound.  Neurological: Negative for numbness and headaches.  Hematological: Does not bruise/bleed easily.  Psychiatric/Behavioral: Negative for confusion.    Allergies  Alprazolam; Codeine; Percodan; and Valium  Home Medications   Current Outpatient Rx  Name  Route  Sig  Dispense  Refill  . aspirin EC 81 MG tablet   Oral   Take 81 mg by mouth every morning.          . carvedilol (COREG) 12.5 MG tablet   Oral   Take 6.25 mg by mouth 2 (two) times daily with a meal.           . clorazepate (TRANXENE) 7.5 MG tablet   Oral   Take 7.5 mg by mouth as needed. For nerves         . lisinopril (PRINIVIL,ZESTRIL) 10 MG tablet   Oral   Take 10 mg by mouth daily.           Marland Kitchen  traMADol (ULTRAM) 50 MG tablet   Oral   Take 50 mg by mouth as needed. Maximum dose= 8 tablets per day          . trimethoprim (TRIMPEX) 100 MG tablet   Oral   Take 100 mg by mouth at bedtime.             BP 125/55  Pulse 75  Temp(Src) 98.1 F (36.7 C) (Oral)  Resp 16  Ht 5' (1.524 m)  Wt 175 lb (79.379 kg)  BMI 34.18 kg/m2  SpO2 97%  Physical Exam  Constitutional: She is oriented to person, place, and time. She appears well-developed and well-nourished. No distress.  HENT:  Head: Normocephalic and atraumatic.  Mouth/Throat: Oropharynx is clear and moist.  Eyes: Conjunctivae are normal.  Neck: Normal range of motion.  Cardiovascular: Normal rate, regular rhythm and normal heart sounds.   Pulmonary/Chest: Effort normal and breath sounds normal.  Abdominal: Soft. Bowel sounds are normal. There is no tenderness.  Musculoskeletal: She exhibits tenderness.  Left knee with some scattered superficial abrasions no effusion good range of motion  no deformity patella dislocated.  Left wrist and hand with swelling elbow nontender shoulder nontender 2+ radial pulse good cap refill to fingers limited range of motion in the fingers and making a fist but can extend them. Limited range of motion of, positive snuffbox tenderness on the wrist. Patient has pre-existing limited range of motion of the left shoulder. Fingers sensation intact.  Neurological: She is alert and oriented to person, place, and time. No cranial nerve deficit. She exhibits normal muscle tone. Coordination normal.  Skin: Skin is warm. No rash noted.    ED Course  Procedures (including critical care time)  Labs Reviewed - No data to display Dg Wrist Complete Left  07/25/2012  *RADIOLOGY REPORT*  Clinical Data: Fall, left wrist pain  LEFT WRIST - COMPLETE 3+ VIEW  Comparison: 07/27/2009  Findings: No fracture or dislocation is seen.  Mild radiocarpal narrowing.  Mild degenerative changes at the first [facility] and MCP joints.  The visualized soft tissues are unremarkable.  IMPRESSION: No fracture or dislocation is seen.   Original Report Authenticated By: Charline Bills, M.D.    Dg Knee Complete 4 Views Left  07/25/2012  *RADIOLOGY REPORT*  Clinical Data: Left knee pain, fall  LEFT KNEE - COMPLETE 4+ VIEW  Comparison: 03/27/2010  Findings: No fracture or dislocation is seen.  Mild tricompartmental degenerative changes, stable.  Suprapatellar and infrapatellar enthesopathy.  The visualized soft tissues are unremarkable.  No suprapatellar knee joint effusion.  IMPRESSION: No fracture or dislocation is seen.  Stable mild degenerative changes.   Original Report Authenticated By: Charline Bills, M.D.      1. Fall, initial encounter   2. Wrist sprain and strain, left, initial encounter   3. Knee contusion, left, initial encounter       MDM  Patient status post fall yesterdaytrue syncope. Injury to the left arrest and left knee. No other specific complaints. Her main complaint is  to the left breast. X-rays of both are negative however she does have snuffbox tenderness in the left breast so cannot rule out a navicular or scaphoid fracture. Patient will be treated with a Velcro splint and followup with orthopedics. The left knee has some abrasions on it no significant effusion good range of motion no concern for major injuries air. Patient is alert no acute distress.        Shelda Jakes, MD 07/25/12 1425  Shelda Jakes, MD 07/25/12 [phone]

## 2012-07-29 ENCOUNTER — Ambulatory Visit: Payer: Medicare Other | Admitting: Urology

## 2012-08-07 ENCOUNTER — Ambulatory Visit (INDEPENDENT_AMBULATORY_CARE_PROVIDER_SITE_OTHER): Payer: Medicare Other | Admitting: Orthopedic Surgery

## 2012-08-07 ENCOUNTER — Encounter: Payer: Self-pay | Admitting: Orthopedic Surgery

## 2012-08-07 VITALS — BP 136/60 | Ht 62.0 in | Wt 180.0 lb

## 2012-08-07 DIAGNOSIS — S63509A Unspecified sprain of unspecified wrist, initial encounter: Secondary | ICD-10-CM

## 2012-08-07 DIAGNOSIS — S8000XA Contusion of unspecified knee, initial encounter: Secondary | ICD-10-CM

## 2012-08-07 DIAGNOSIS — S63502A Unspecified sprain of left wrist, initial encounter: Secondary | ICD-10-CM

## 2012-08-07 DIAGNOSIS — S8002XA Contusion of left knee, initial encounter: Secondary | ICD-10-CM

## 2012-08-07 HISTORY — DX: Unspecified sprain of unspecified wrist, initial encounter: S63.509A

## 2012-08-07 NOTE — Patient Instructions (Addendum)
Wear braces and splints as needed take medication as needed   Hand surgery consult right ring finger mass

## 2012-08-07 NOTE — Progress Notes (Signed)
Patient ID: [name], female   DOB: 22-Jul-1935, 77 y.o.   MRN: [id number] Chief Complaint  Patient presents with  . Pain    Left wrist and Left knee pain d/t fall 07/25/12     History the patient fell on March 14 complains of sharp dull intermittent discomfort in her left knee and left wrist which is improved with a heating pad and worse with range of motion. Other symptoms include bruising numbness and catching.  She fell in her yard  Should x-rays of the wrist and the knee both were reviewed she has some arthritis in the knee no acute fractures in either area  Presents for evaluation and treatment  History of fatigue chest pain chest murmurs or heart murmurs shortness of breath joint pain and swelling stiffness redness and poor healing in the skin nervousness and easy bleeding excessive urination numbness and tingling an unsteady gait  Past Medical History  Diagnosis Date  . Aneurysm   . Liver mass   . Lung, cysts, congenital     left lung cyst  . Kidney stone     embedded  . Hemorrhoids   . Anxiety     BP 136/60  Ht 5\' 2"  (1.575 m)  Wt 180 lb (81.647 kg)  BMI 32.91 kg/m2 General appearance is normal, the patient is alert and oriented x3 with normal mood and affect. Ambulation is not difficult for her she is ambulating with a heel-toe gait  Her left wrist is tender over the dorsum no deformity full range of motion wrist joint is stable Watson click test is normal her exam is intact skin is intact  Left knee abrasions over the skin no joint effusion normal range of motion no ligamentous instability good motor function normal skin  Again x-rays negative  Sprain of wrist, left, initial encounter  Contusion of left knee, initial encounter   Recommend activities as tolerated take medication as needed wear braces as needed followup as needed

## 2012-08-11 ENCOUNTER — Telehealth: Payer: Self-pay | Admitting: *Deleted

## 2012-08-11 ENCOUNTER — Other Ambulatory Visit: Payer: Self-pay | Admitting: *Deleted

## 2012-08-11 DIAGNOSIS — R2231 Localized swelling, mass and lump, right upper limb: Secondary | ICD-10-CM

## 2012-08-11 NOTE — Telephone Encounter (Signed)
Faxed referral and office notes to [facility], Dr. Gramig. Awaiting appointment. 

## 2012-09-01 ENCOUNTER — Telehealth: Payer: Self-pay | Admitting: *Deleted

## 2012-09-01 NOTE — Telephone Encounter (Signed)
I called patient to check on her referral appointment with [facility]. The patient stated, she never heard anything from them, but she told me to, "let it go". She said she has a hard time getting around and did not want to go to [place]. Stated she might check on something next month.

## 2012-09-16 ENCOUNTER — Ambulatory Visit (INDEPENDENT_AMBULATORY_CARE_PROVIDER_SITE_OTHER): Payer: Medicare Other | Admitting: Urology

## 2012-09-16 DIAGNOSIS — N3020 Other chronic cystitis without hematuria: Secondary | ICD-10-CM

## 2012-10-02 ENCOUNTER — Other Ambulatory Visit (HOSPITAL_COMMUNITY): Payer: Self-pay | Admitting: Pulmonary Disease

## 2012-10-02 ENCOUNTER — Other Ambulatory Visit (HOSPITAL_COMMUNITY): Payer: Medicare Other

## 2012-10-02 ENCOUNTER — Ambulatory Visit (HOSPITAL_COMMUNITY)
Admission: RE | Admit: 2012-10-02 | Discharge: 2012-10-02 | Disposition: A | Discharge status: Home / Self Care | Payer: Medicare Other | Source: Ambulatory Visit | Attending: Pulmonary Disease | Admitting: Pulmonary Disease

## 2012-10-02 DIAGNOSIS — R1011 Right upper quadrant pain: Secondary | ICD-10-CM

## 2012-10-02 DIAGNOSIS — I10 Essential (primary) hypertension: Secondary | ICD-10-CM | POA: Diagnosis not present

## 2012-10-02 DIAGNOSIS — M1990 Unspecified osteoarthritis, unspecified site: Secondary | ICD-10-CM | POA: Diagnosis not present

## 2012-10-02 DIAGNOSIS — F411 Generalized anxiety disorder: Secondary | ICD-10-CM | POA: Diagnosis not present

## 2012-10-02 DIAGNOSIS — D1803 Hemangioma of intra-abdominal structures: Secondary | ICD-10-CM | POA: Diagnosis not present

## 2012-10-02 DIAGNOSIS — D1800 Hemangioma unspecified site: Secondary | ICD-10-CM | POA: Diagnosis not present

## 2012-10-02 DIAGNOSIS — R1084 Generalized abdominal pain: Secondary | ICD-10-CM | POA: Diagnosis not present

## 2012-10-31 ENCOUNTER — Encounter (HOSPITAL_COMMUNITY): Payer: Self-pay | Admitting: *Deleted

## 2012-10-31 ENCOUNTER — Emergency Department (HOSPITAL_COMMUNITY)
Admission: EM | Admit: 2012-10-31 | Discharge: 2012-10-31 | Disposition: A | Discharge status: Home / Self Care | Payer: Medicare Other | Attending: Emergency Medicine | Admitting: Emergency Medicine

## 2012-10-31 DIAGNOSIS — R110 Nausea: Secondary | ICD-10-CM | POA: Insufficient documentation

## 2012-10-31 DIAGNOSIS — G8929 Other chronic pain: Secondary | ICD-10-CM | POA: Insufficient documentation

## 2012-10-31 DIAGNOSIS — Z8709 Personal history of other diseases of the respiratory system: Secondary | ICD-10-CM | POA: Diagnosis not present

## 2012-10-31 DIAGNOSIS — R350 Frequency of micturition: Secondary | ICD-10-CM | POA: Insufficient documentation

## 2012-10-31 DIAGNOSIS — Z9889 Other specified postprocedural states: Secondary | ICD-10-CM | POA: Insufficient documentation

## 2012-10-31 DIAGNOSIS — R3915 Urgency of urination: Secondary | ICD-10-CM | POA: Insufficient documentation

## 2012-10-31 DIAGNOSIS — Z87442 Personal history of urinary calculi: Secondary | ICD-10-CM | POA: Insufficient documentation

## 2012-10-31 DIAGNOSIS — Z8719 Personal history of other diseases of the digestive system: Secondary | ICD-10-CM | POA: Insufficient documentation

## 2012-10-31 DIAGNOSIS — Z9089 Acquired absence of other organs: Secondary | ICD-10-CM | POA: Insufficient documentation

## 2012-10-31 DIAGNOSIS — Z8679 Personal history of other diseases of the circulatory system: Secondary | ICD-10-CM | POA: Diagnosis not present

## 2012-10-31 DIAGNOSIS — R42 Dizziness and giddiness: Secondary | ICD-10-CM | POA: Diagnosis not present

## 2012-10-31 DIAGNOSIS — M549 Dorsalgia, unspecified: Secondary | ICD-10-CM | POA: Insufficient documentation

## 2012-10-31 DIAGNOSIS — F411 Generalized anxiety disorder: Secondary | ICD-10-CM | POA: Diagnosis not present

## 2012-10-31 DIAGNOSIS — Z87891 Personal history of nicotine dependence: Secondary | ICD-10-CM | POA: Diagnosis not present

## 2012-10-31 DIAGNOSIS — R300 Dysuria: Secondary | ICD-10-CM | POA: Insufficient documentation

## 2012-10-31 DIAGNOSIS — R109 Unspecified abdominal pain: Secondary | ICD-10-CM | POA: Insufficient documentation

## 2012-10-31 DIAGNOSIS — M329 Systemic lupus erythematosus, unspecified: Secondary | ICD-10-CM | POA: Insufficient documentation

## 2012-10-31 DIAGNOSIS — N342 Other urethritis: Secondary | ICD-10-CM | POA: Diagnosis not present

## 2012-10-31 DIAGNOSIS — Z79899 Other long term (current) drug therapy: Secondary | ICD-10-CM | POA: Insufficient documentation

## 2012-10-31 DIAGNOSIS — Z7982 Long term (current) use of aspirin: Secondary | ICD-10-CM | POA: Diagnosis not present

## 2012-10-31 DIAGNOSIS — Z90710 Acquired absence of both cervix and uterus: Secondary | ICD-10-CM | POA: Insufficient documentation

## 2012-10-31 LAB — URINALYSIS, ROUTINE W REFLEX MICROSCOPIC
Bilirubin Urine: NEGATIVE
Glucose, UA: NEGATIVE mg/dL
Hgb urine dipstick: NEGATIVE
Ketones, ur: NEGATIVE mg/dL
Leukocytes, UA: NEGATIVE
Nitrite: NEGATIVE
Protein, ur: NEGATIVE mg/dL
Specific Gravity, Urine: <1.005 — ABNORMAL LOW (ref 1.005–1.030)
Urobilinogen, UA: 0.2 mg/dL (ref 0.0–1.0)
pH: 6 (ref 5.0–8.0)

## 2012-10-31 MED ORDER — PHENAZOPYRIDINE HCL 200 MG PO TABS: 200.0000 mg | ORAL_TABLET | Freq: Three times a day (TID) | ORAL | Status: DC | PRN

## 2012-10-31 NOTE — ED Notes (Signed)
Nausea, dizziness, lower back pain and lower abdominal pain with urinary incontinence at times. Pt states she feels like she has a UTI.

## 2012-10-31 NOTE — ED Provider Notes (Addendum)
History    This chart was scribed for Ward Givens, MD by Leone Payor, ED Scribe. This patient was seen in room APA16A/APA16A and the patient's care was started 2:00 PM.   CSN: [id number]  Arrival date & time 10/31/12  1315   First MD Initiated Contact with Patient 10/31/12 1358      Chief Complaint  Patient presents with  . Urinary Incontinence  . Abdominal Pain  . Dizziness     The history is provided by the patient. No language interpreter was used.    HPI Comments: [name] is a 77 y.o. female who presents to the Emergency Department complaining constant symptoms of an UTI but with  a new flare up  starting yesterday. States she feels lightheaded and off balance when standing. She took 1/2 a meclizine and now she feels better. She has had discomfort with urinating along with urgency and frequency. She has had some incontinence of urine.  She also has nausea without vomiting and increased BMs yesterday. Reports having chronic abdominal pain. She denies having hematuria, fever, headache, blurred or double vision.  She reports recent diagnosis of "lupus of the skin" on her forehead by Dr Margo Aye, dermatology.  Pt lives at home and cares for her handicapped daughter. Pt denies smoking and alcohol use.   PCP Dr Juanetta Gosling  Past Medical History  Diagnosis Date  . Aneurysm   . Liver mass   . Lung, cysts, congenital     left lung cyst  . Kidney stone     embedded  . Hemorrhoids   . Anxiety     Past Surgical History  Procedure Laterality Date  . Tonsillectomy and adenoidectomy    . Complete hysterectomy    . Benign breast tumors    . Cholecystectomy    . Colonoscopy      Family History  Problem Relation Age of Onset  . Heart disease    . Arthritis    . Lung disease    . Cancer    . Asthma    . Diabetes    . Kidney disease      History  Substance Use Topics  . Smoking status: Former Games developer  . Smokeless tobacco: Not on file  . Alcohol Use: No  lives at  home Lives with handicapped daughter  OB History   Grav Para Term Preterm Abortions TAB SAB Ect Mult Living                  Review of Systems  Gastrointestinal: Positive for nausea and abdominal pain. Negative for vomiting.  Genitourinary: Positive for urgency and frequency. Negative for dysuria and hematuria.  Musculoskeletal: Positive for back pain.  Neurological: Positive for light-headedness.  All other systems reviewed and are negative.    Allergies  Alprazolam; Codeine; Percodan; and Valium  Home Medications   Current Outpatient Rx  Name  Route  Sig  Dispense  Refill  . aspirin EC 81 MG tablet   Oral   Take 81 mg by mouth every morning.          . carvedilol (COREG) 12.5 MG tablet   Oral   Take 6.25 mg by mouth 2 (two) times daily with a meal.           . clorazepate (TRANXENE) 7.5 MG tablet   Oral   Take 7.5 mg by mouth as needed. For nerves         . lisinopril (PRINIVIL,ZESTRIL) 10 MG tablet  Oral   Take 10 mg by mouth daily.           . traMADol (ULTRAM) 50 MG tablet   Oral   Take 50 mg by mouth as needed. Maximum dose= 8 tablets per day          . trimethoprim (TRIMPEX) 100 MG tablet   Oral   Take 100 mg by mouth at bedtime.             BP 167/70  Pulse 81  Temp(Src) 97.5 F (36.4 C) (Oral)  Resp 20  Ht 5' (1.524 m)  Wt 190 lb (86.183 kg)  BMI 37.11 kg/m2  SpO2 96%  Vital signs normal    Physical Exam  Nursing note and vitals reviewed. Constitutional: She is oriented to person, place, and time. She appears well-developed and well-nourished.  Non-toxic appearance. She does not appear ill. No distress.  HENT:  Head: Normocephalic and atraumatic.  Right Ear: External ear normal.  Left Ear: External ear normal.  Nose: Nose normal. No mucosal edema or rhinorrhea.  Mouth/Throat: Oropharynx is clear and moist and mucous membranes are normal. No dental abscesses or edematous.  Eyes: Conjunctivae and EOM are normal. Pupils are  equal, round, and reactive to light.  Neck: Normal range of motion and full passive range of motion without pain. Neck supple.  Cardiovascular: Normal rate, regular rhythm and normal heart sounds.  Exam reveals no gallop and no friction rub.   No murmur heard. Pulmonary/Chest: Effort normal and breath sounds normal. No respiratory distress. She has no wheezes. She has no rhonchi. She has no rales. She exhibits no tenderness and no crepitus.  Abdominal: Soft. Normal appearance and bowel sounds are normal. She exhibits no distension. There is no tenderness. There is no rebound and no guarding.  Genitourinary:  No CVAT  Musculoskeletal: Normal range of motion. She exhibits no edema and no tenderness.  Moves all extremities well.   Neurological: She is alert and oriented to person, place, and time. She has normal strength. No cranial nerve deficit.  Skin: Skin is warm, dry and intact. No rash noted. No erythema. No pallor.  Psychiatric: She has a normal mood and affect. Her speech is normal and behavior is normal. Her mood appears not anxious.    ED Course  Procedures (including critical care time)  DIAGNOSTIC STUDIES: Oxygen Saturation is 96% on RA, adequate by my interpretation.    COORDINATION OF CARE: 2:37 PM Discussed treatment plan with pt at bedside and pt agreed to plan.   Results for orders placed during the hospital encounter of 10/31/12  URINALYSIS, ROUTINE W REFLEX MICROSCOPIC      Result Value Range   Color, Urine YELLOW  YELLOW   APPearance CLEAR  CLEAR   Specific Gravity, Urine <1.005 (*) 1.005 - 1.030   pH 6.0  5.0 - 8.0   Glucose, UA NEGATIVE  NEGATIVE mg/dL   Hgb urine dipstick NEGATIVE  NEGATIVE   Bilirubin Urine NEGATIVE  NEGATIVE   Ketones, ur NEGATIVE  NEGATIVE mg/dL   Protein, ur NEGATIVE  NEGATIVE mg/dL   Urobilinogen, UA 0.2  0.0 - 1.0 mg/dL   Nitrite NEGATIVE  NEGATIVE   Leukocytes, UA NEGATIVE  NEGATIVE   Laboratory interpretation all normal   US  Abdomen Limited Ruq  10/02/2012    IMPRESSION:  1.  Prior cholecystectomy. 2.  Increased caliber of the common bile duct is within normal limits status post cholecystectomy. 3.  Right hepatic lobe hemangioma.  Original Report Authenticated By: Signa Kell, M.D.     1. Urethritis     New Prescriptions   PHENAZOPYRIDINE (PYRIDIUM) 200 MG TABLET    Take 1 tablet (200 mg total) by mouth 3 (three) times daily as needed for pain.     Plan discharge  Devoria Albe, MD, FACEP     MDM   I personally performed the services described in this documentation, which was scribed in my presence. The recorded information has been reviewed and considered.  Devoria Albe, MD, FACEP   Ward Givens, MD 10/31/12 1656  Ward Givens, MD 10/31/12 1610

## 2012-10-31 NOTE — ED Notes (Signed)
States that she has had urinary incontinence for 6 years or more.  States that she had an episode of dizziness and nausea yesterday, states that she feels tired and thinks that she may have a urinary tract infection.  The pt is very vague with her symptoms and is a poor historian.

## 2012-11-16 DIAGNOSIS — I10 Essential (primary) hypertension: Secondary | ICD-10-CM | POA: Diagnosis not present

## 2012-11-16 DIAGNOSIS — J209 Acute bronchitis, unspecified: Secondary | ICD-10-CM | POA: Diagnosis not present

## 2012-11-16 DIAGNOSIS — Z7982 Long term (current) use of aspirin: Secondary | ICD-10-CM | POA: Diagnosis not present

## 2012-11-16 DIAGNOSIS — R05 Cough: Secondary | ICD-10-CM | POA: Diagnosis not present

## 2012-11-16 DIAGNOSIS — R059 Cough, unspecified: Secondary | ICD-10-CM | POA: Diagnosis not present

## 2012-11-16 DIAGNOSIS — Z79899 Other long term (current) drug therapy: Secondary | ICD-10-CM | POA: Diagnosis not present

## 2012-11-21 ENCOUNTER — Encounter: Payer: Self-pay | Admitting: *Deleted

## 2012-11-24 ENCOUNTER — Ambulatory Visit (INDEPENDENT_AMBULATORY_CARE_PROVIDER_SITE_OTHER): Payer: Medicare Other | Admitting: Cardiovascular Disease

## 2012-11-24 ENCOUNTER — Encounter: Payer: Self-pay | Admitting: Cardiovascular Disease

## 2012-11-24 VITALS — BP 132/74 | HR 76 | Ht 62.0 in | Wt 180.0 lb

## 2012-11-24 DIAGNOSIS — I10 Essential (primary) hypertension: Secondary | ICD-10-CM | POA: Diagnosis not present

## 2012-11-24 DIAGNOSIS — I428 Other cardiomyopathies: Secondary | ICD-10-CM | POA: Diagnosis not present

## 2012-11-24 NOTE — Progress Notes (Signed)
Patient ID: [name], female   DOB: [birth year], 77 y.o.   MRN: [id number]    CARDIOLOGY CONSULT NOTE  Patient ID: [name] MRN: [id number] DOB/AGE: July 19, 1935 77 y.o.  Admit date:  Primary Physician: Kari Baars, MD Reason for Consultation: non-ischemic cardiomyopathy  HPI:  [name] is a 77 y.o. Woman with HTN, a LBBB, and a nonischemic cardiomyopathy with a previous EF of 4045% in June 2010 (also mild LVH). She had a cath in 2005 which revealed normal coronary arteries. She also has a mildly dilated thoracic aorta as per old office records (August 2013, 42 mm).  She is getting over an acute bronchitis which started over the July 4th weekend.   She has SOB due to this. She has chronic upper body pains (chest, back), but these are not any worse than usual. Her chest pain is made worse by leaning forward.   Review of systems complete and found to be negative unless listed above in HPI  Past Medical History: as per HPI, also hypercholesterolemia, neck and shoulder surgeries  SocHx: takes care of her 75 y.o. daughter   Family History  Problem Relation Age of Onset  . Heart disease    . Arthritis    . Lung disease    . Cancer    . Asthma    . Diabetes    . Kidney disease      History   Social History  . Marital Status: Widowed    Spouse Name: N/A    Number of Children: N/A  . Years of Education: 9th   Occupational History  .     Social History Main Topics  . Smoking status: Former Games developer  . Smokeless tobacco: Not on file  . Alcohol Use: No  . Drug Use: No  . Sexually Active: Not on file   Other Topics Concern  . Not on file   Social History Narrative  . No narrative on file       Physical exam Blood pressure 132/74, pulse 76, height 5\' 2"  (1.575 m), weight 180 lb (81.647 kg). General: NAD Neck: No JVD, no thyromegaly or thyroid nodule.  Lungs: Clear to auscultation bilaterally with normal respiratory effort. CV: Nondisplaced PMI.  Heart  regular S1, split S2, no S3/S4, no murmur.  No peripheral edema.  No carotid bruit.  Normal pedal pulses.  Abdomen: Soft, nontender, no hepatosplenomegaly, no distention.  Skin: Intact without lesions or rashes.  Neurologic: Alert and oriented x 3.  Psych: Normal affect. Extremities: No clubbing or cyanosis.  HEENT: Normal.   Labs:   Lab Results  Component Value Date   WBC 5.3 06/07/2012   HGB 14.3 06/07/2012   HCT 42.4 06/07/2012   MCV 83.1 06/07/2012   PLT 238 06/07/2012   No results found for this basename: NA, K, CL, CO2, BUN, CREATININE, CALCIUM, LABALBU, PROT, BILITOT, ALKPHOS, ALT, AST, GLUCOSE,  in the last 168 hours No results found for this basename: CKTOTAL, CKMB, CKMBINDEX, TROPONINI    No results found for this basename: CHOL   No results found for this basename: HDL   No results found for this basename: LDLCALC   No results found for this basename: TRIG   No results found for this basename: CHOLHDL   No results found for this basename: LDLDIRECT       EKG (January 2014): Sinus rhythm, rate 85 bpm, LBBB Echo: see HPI   ASSESSMENT AND PLAN:  1. Non-ischemic cardiomyopathy: will obtain an echocardiogram  to see if there's been any interval improvement in her ejection fraction. Continue Lisinopril and Carvedilol.  Signed: Prentice Docker, M.D., F.A.C.C. 11/24/2012, 1:31 PM

## 2012-11-24 NOTE — Patient Instructions (Addendum)
Your physician recommends that you schedule a follow-up appointment in: 1 year You will receive a reminder letter in the mail in about 10 months reminding you to call and schedule your appointment. If you don't receive this letter, please contact our office.  Your physician has requested that you have an echocardiogram. Echocardiography is a painless test that uses sound waves to create images of your heart. It provides your doctor with information about the size and shape of your heart and how well your heart's chambers and valves are working. This procedure takes approximately one hour. There are no restrictions for this procedure.

## 2012-11-25 ENCOUNTER — Encounter: Payer: Self-pay | Admitting: Cardiovascular Disease

## 2012-11-28 ENCOUNTER — Ambulatory Visit (HOSPITAL_COMMUNITY)
Admission: RE | Admit: 2012-11-28 | Discharge: 2012-11-28 | Disposition: A | Discharge status: Home / Self Care | Payer: Medicare Other | Source: Ambulatory Visit | Attending: Cardiovascular Disease | Admitting: Cardiovascular Disease

## 2012-11-28 DIAGNOSIS — I10 Essential (primary) hypertension: Secondary | ICD-10-CM

## 2012-11-28 DIAGNOSIS — I428 Other cardiomyopathies: Secondary | ICD-10-CM | POA: Insufficient documentation

## 2012-11-28 NOTE — Progress Notes (Signed)
*  PRELIMINARY RESULTS* Echocardiogram 2D Echocardiogram has been performed.  [name] 11/28/2012, 11:58 AM

## 2012-12-09 ENCOUNTER — Telehealth: Payer: Self-pay | Admitting: Cardiovascular Disease

## 2012-12-09 NOTE — Telephone Encounter (Signed)
Results of Echo / tgs °

## 2012-12-09 NOTE — Telephone Encounter (Signed)
Please advise 

## 2012-12-11 NOTE — Telephone Encounter (Signed)
You can inform pt that heart function is slightly decreased from prior echo in 2010. Continue current therapy.

## 2012-12-12 NOTE — Telephone Encounter (Signed)
Spoke to pt to advise results/instructions. Pt understood.  

## 2012-12-12 NOTE — Telephone Encounter (Signed)
.  left message to have patient return my call.  

## 2012-12-24 DIAGNOSIS — H9319 Tinnitus, unspecified ear: Secondary | ICD-10-CM | POA: Diagnosis not present

## 2012-12-24 DIAGNOSIS — H905 Unspecified sensorineural hearing loss: Secondary | ICD-10-CM | POA: Diagnosis not present

## 2012-12-24 DIAGNOSIS — H908 Mixed conductive and sensorineural hearing loss, unspecified: Secondary | ICD-10-CM | POA: Diagnosis not present

## 2013-04-27 DIAGNOSIS — F411 Generalized anxiety disorder: Secondary | ICD-10-CM | POA: Diagnosis not present

## 2013-04-27 DIAGNOSIS — I10 Essential (primary) hypertension: Secondary | ICD-10-CM | POA: Diagnosis not present

## 2013-04-27 DIAGNOSIS — M1990 Unspecified osteoarthritis, unspecified site: Secondary | ICD-10-CM | POA: Diagnosis not present

## 2013-05-19 DIAGNOSIS — D4819 Other specified neoplasm of uncertain behavior of connective and other soft tissue: Secondary | ICD-10-CM | POA: Diagnosis not present

## 2013-05-19 DIAGNOSIS — D481 Neoplasm of uncertain behavior of connective and other soft tissue: Secondary | ICD-10-CM | POA: Diagnosis not present

## 2013-05-21 ENCOUNTER — Telehealth: Payer: Self-pay | Admitting: *Deleted

## 2013-05-21 NOTE — Telephone Encounter (Signed)
PT states that she has not idea what is wrong with her heart. States that no one ever told her what her condition was. She remembers a nurse calling her to go over Echo results and just told her that it had declined and couldn't tell her what that ment. She was told that the doctor would go over it when she was seen next (Not till 11/2013).   Pt would like a letter stating her condition and what that means sent to her.

## 2013-05-21 NOTE — Telephone Encounter (Signed)
Pt is made aware of changes in her echo in July. Pt understands now.

## 2013-05-26 ENCOUNTER — Telehealth: Payer: Self-pay | Admitting: Cardiovascular Disease

## 2013-05-26 NOTE — Telephone Encounter (Signed)
Returned pt call, she advised that she did not receive any paperwork concerning the information the other nurse advise her about the echo, and she was advised Dr. Bronson Ing will go over in detail at her next office visit, however pt concerned per next apt to be 11-2013, pt notes she has minor chest pain that last 8-10 seconds on and off daily since she had her echo because she feels that a rib is cracked and it hurts when she takes a deep breath as well as SOB upon exertion periodically as well, noted echo on 11-28-2012, Dr Luan Pulling saw the pt in office 2-3 weeks and pt was advised that he would contact our office and gave no instructions based on the same concerns she advised this nurse today with,Dr Luan Pulling  pt has not checked her BP, denies swelling, noted Echo does not have result information

## 2013-05-26 NOTE — Telephone Encounter (Signed)
Pt scheduled for 06-09-13 at 8:20am with Dr. Bronson Ing pt advised if sxs worsen to go to ED, pt understood

## 2013-05-26 NOTE — Telephone Encounter (Signed)
Would like return call regarding previous phone note / tgs

## 2013-05-26 NOTE — Telephone Encounter (Signed)
Please make a f/u appt with me within the next 3-4 weeks. I will address her concerns/questions at that time.

## 2013-06-09 ENCOUNTER — Ambulatory Visit (INDEPENDENT_AMBULATORY_CARE_PROVIDER_SITE_OTHER): Payer: Medicare Other | Admitting: Cardiovascular Disease

## 2013-06-09 ENCOUNTER — Encounter (INDEPENDENT_AMBULATORY_CARE_PROVIDER_SITE_OTHER): Payer: Self-pay

## 2013-06-09 VITALS — BP 154/84 | HR 86 | Ht 63.0 in | Wt 178.0 lb

## 2013-06-09 DIAGNOSIS — I712 Thoracic aortic aneurysm, without rupture, unspecified: Secondary | ICD-10-CM

## 2013-06-09 DIAGNOSIS — I428 Other cardiomyopathies: Secondary | ICD-10-CM

## 2013-06-09 DIAGNOSIS — I10 Essential (primary) hypertension: Secondary | ICD-10-CM | POA: Diagnosis not present

## 2013-06-09 MED ORDER — CARVEDILOL 12.5 MG PO TABS: 12.5000 mg | ORAL_TABLET | Freq: Two times a day (BID) | ORAL | Status: DC

## 2013-06-09 NOTE — Patient Instructions (Addendum)
Your physician wants you to follow-up in: 6 months You will receive a reminder letter in the mail two months in advance. If you don't receive a letter, please call our office to schedule the follow-up appointment.    Your physician has recommended you make the following change in your medication:   1. INCREASE Coreg to 12.5 mg twice a day   You will need to have CT scan of your chest,please make apt today before you leave

## 2013-06-09 NOTE — Progress Notes (Signed)
Patient ID: [name], female   DOB: 01-03-1936, 78 y.o.   MRN: [id number]      SUBJECTIVE: [name] is a 78 y.o. Woman with HTN, a LBBB, and a nonischemic cardiomyopathy with a previous EF of 40-45% in June 2010 (also mild LVH). She had a cath in 2005 which revealed normal coronary arteries. She also has a mildly dilated thoracic aorta as per old office records (August 2013, 42 mm). A followup echocardiogram performed in July 2014 revealed moderately reduced left ventricle systolic function, EF 78-29%, grade 1 diastolic dysfunction, LV dilatation, and mild aortic and mitral regurgitation. She feels like she "cracked a rib" in the left inframammary region after her echo. She denies exertional chest pain. She does feel slightly more dyspneic with exertion. She denies orthopnea and PND. She also denies leg swelling. She seldom has palpitations.  Her 76 year old daughter, whom she used to care for, passed away this past Mar 25, 2023.     Allergies  Allergen Reactions  . Alprazolam Nausea And Vomiting  . Codeine Nausea And Vomiting  . Percodan [Oxycodone-Aspirin] Nausea And Vomiting  . Valium Nausea And Vomiting    Current Outpatient Prescriptions  Medication Sig Dispense Refill  . aspirin EC 81 MG tablet Take 81 mg by mouth every morning.       . carvedilol (COREG) 12.5 MG tablet Take 6.25 mg by mouth 2 (two) times daily with a meal.        . clorazepate (TRANXENE) 7.5 MG tablet Take 7.5 mg by mouth as needed. For nerves      . dicyclomine (BENTYL) 10 MG capsule Take 10 mg by mouth 2 (two) times daily.      Marland Kitchen lisinopril (PRINIVIL,ZESTRIL) 10 MG tablet Take 10 mg by mouth daily.        . traMADol (ULTRAM) 50 MG tablet Take 50 mg by mouth as needed. Maximum dose= 8 tablets per day       . trimethoprim (TRIMPEX) 100 MG tablet Take 100 mg by mouth at bedtime.         No current facility-administered medications for this visit.    Past Medical History  Diagnosis Date  . Aneurysm   .  Liver mass   . Lung, cysts, congenital     left lung cyst  . Kidney stone     embedded  . Hemorrhoids   . Anxiety   . Hypertension     Past Surgical History  Procedure Laterality Date  . Tonsillectomy and adenoidectomy    . Complete hysterectomy    . Benign breast tumors    . Cholecystectomy    . Colonoscopy      History   Social History  . Marital Status: Widowed    Spouse Name: N/A    Number of Children: N/A  . Years of Education: 9th   Occupational History  .     Social History Main Topics  . Smoking status: Former Research scientist (life sciences)  . Smokeless tobacco: Not on file  . Alcohol Use: No  . Drug Use: No  . Sexual Activity: Not on file   Other Topics Concern  . Not on file   Social History Narrative  . No narrative on file     Filed Vitals:   06/09/13 0805  BP: 154/84  Pulse: 86  Height: 5\' 3"  (1.6 m)  Weight: 178 lb (80.74 kg)    PHYSICAL EXAM General: NAD Neck: No JVD, no thyromegaly or thyroid nodule.  Lungs: Clear to auscultation  bilaterally with normal respiratory effort. CV: Nondisplaced PMI.  Heart regular rhythm, occasional premature contractions, normal S1/S2, no S3/S4, no murmur.  No peripheral edema.  No carotid bruit.  Normal pedal pulses.  Abdomen: Soft, nontender, no hepatosplenomegaly, no distention.  Neurologic: Alert and oriented x 3.  Psych: Normal affect. Extremities: No clubbing or cyanosis.   ECG: reviewed and available in electronic records.  Echo (11-28-2012): - Left ventricle: Technically difficult study. Moderate concentric left ventricular hypertrophy. The cavity size was moderately dilated. Systolic function was moderately reduced. The estimated ejection fraction was in the range of 35% to 40%. The septum and anteroseptal wall are akinetic, and the remaining walls are mild to moderately hypokinetic. There was an increased relative contribution of atrial contraction to ventricular filling. Doppler parameters are consistent with  abnormal left ventricular relaxation (grade 1 diastolic dysfunction). Doppler parameters are consistent with elevated ventricular end-diastolic filling pressure. - Aortic valve: Mild regurgitation. - Mitral valve: Mild regurgitation. - Left atrium: The atrium was mildly dilated.     ASSESSMENT AND PLAN: 1. Non-ischemic cardiomyopathy: slightly more dyspneic with exertion. Will continue lisinopril and increase carvedilol to 12.5 mg bid. 2. HTN: uncontrolled. Increase Coreg as mentioned above. 3. Thoracic aortic aneurysm: will obtain a CT angiogram of the chest to evaluate for interval progression.  Dispo: f/u 6 months.   Kate Sable, M.D., F.A.C.C.

## 2013-06-10 ENCOUNTER — Encounter (HOSPITAL_COMMUNITY): Payer: Self-pay

## 2013-06-10 ENCOUNTER — Ambulatory Visit (HOSPITAL_COMMUNITY)
Admission: RE | Admit: 2013-06-10 | Discharge: 2013-06-10 | Disposition: A | Discharge status: Home / Self Care | Payer: Medicare Other | Source: Ambulatory Visit | Attending: Cardiovascular Disease | Admitting: Cardiovascular Disease

## 2013-06-10 DIAGNOSIS — I77810 Thoracic aortic ectasia: Secondary | ICD-10-CM | POA: Insufficient documentation

## 2013-06-10 DIAGNOSIS — I77819 Aortic ectasia, unspecified site: Secondary | ICD-10-CM | POA: Diagnosis not present

## 2013-06-10 DIAGNOSIS — I712 Thoracic aortic aneurysm, without rupture, unspecified: Secondary | ICD-10-CM

## 2013-06-10 MED ORDER — IOHEXOL 350 MG/ML SOLN
100.0000 mL | Freq: Once | INTRAVENOUS | Status: AC | PRN
  Administered 2013-06-10: 100 mL via INTRAVENOUS

## 2013-06-10 MED ORDER — IOHEXOL 350 MG/ML SOLN: 150.0000 mL | Freq: Once | INTRAVENOUS | Status: DC | PRN

## 2013-07-17 ENCOUNTER — Telehealth: Payer: Self-pay | Admitting: Cardiovascular Disease

## 2013-07-17 DIAGNOSIS — D481 Neoplasm of uncertain behavior of connective and other soft tissue: Secondary | ICD-10-CM | POA: Diagnosis not present

## 2013-07-17 MED ORDER — LISINOPRIL 10 MG PO TABS: 10.0000 mg | ORAL_TABLET | Freq: Every day | ORAL | Status: DC

## 2013-07-17 NOTE — Telephone Encounter (Signed)
rx refilled per reqest

## 2013-07-17 NOTE — Telephone Encounter (Signed)
Received fax refill request  Rx # [id number] Medication:  Lisinopril 10 mg tablet Qty 30 Sig:  Take one tablet by mouth once daily Physician:  Bronson Ing

## 2013-08-13 DIAGNOSIS — D481 Neoplasm of uncertain behavior of connective and other soft tissue: Secondary | ICD-10-CM | POA: Diagnosis not present

## 2013-08-20 ENCOUNTER — Observation Stay (HOSPITAL_COMMUNITY)
Admission: EM | Admit: 2013-08-20 | Discharge: 2013-08-21 | Disposition: A | Discharge status: Home / Self Care | Payer: Medicare Other | Attending: Pulmonary Disease | Admitting: Pulmonary Disease

## 2013-08-20 ENCOUNTER — Emergency Department (HOSPITAL_COMMUNITY): Payer: Medicare Other

## 2013-08-20 ENCOUNTER — Encounter (HOSPITAL_COMMUNITY): Payer: Self-pay | Admitting: Emergency Medicine

## 2013-08-20 DIAGNOSIS — K8689 Other specified diseases of pancreas: Secondary | ICD-10-CM | POA: Diagnosis not present

## 2013-08-20 DIAGNOSIS — G360 Neuromyelitis optica [Devic]: Secondary | ICD-10-CM | POA: Diagnosis not present

## 2013-08-20 DIAGNOSIS — I729 Aneurysm of unspecified site: Secondary | ICD-10-CM

## 2013-08-20 DIAGNOSIS — I712 Thoracic aortic aneurysm, without rupture, unspecified: Secondary | ICD-10-CM | POA: Diagnosis not present

## 2013-08-20 DIAGNOSIS — I359 Nonrheumatic aortic valve disorder, unspecified: Secondary | ICD-10-CM | POA: Diagnosis not present

## 2013-08-20 DIAGNOSIS — I10 Essential (primary) hypertension: Secondary | ICD-10-CM

## 2013-08-20 DIAGNOSIS — I059 Rheumatic mitral valve disease, unspecified: Secondary | ICD-10-CM | POA: Insufficient documentation

## 2013-08-20 DIAGNOSIS — F411 Generalized anxiety disorder: Secondary | ICD-10-CM | POA: Diagnosis not present

## 2013-08-20 DIAGNOSIS — Z87891 Personal history of nicotine dependence: Secondary | ICD-10-CM | POA: Insufficient documentation

## 2013-08-20 DIAGNOSIS — R079 Chest pain, unspecified: Secondary | ICD-10-CM | POA: Diagnosis not present

## 2013-08-20 DIAGNOSIS — Z7982 Long term (current) use of aspirin: Secondary | ICD-10-CM | POA: Diagnosis not present

## 2013-08-20 DIAGNOSIS — E041 Nontoxic single thyroid nodule: Secondary | ICD-10-CM

## 2013-08-20 DIAGNOSIS — K869 Disease of pancreas, unspecified: Secondary | ICD-10-CM

## 2013-08-20 LAB — BASIC METABOLIC PANEL
BUN: 11 mg/dL (ref 6–23)
CO2: 30 mEq/L (ref 19–32)
Calcium: 10 mg/dL (ref 8.4–10.5)
Chloride: 100 mEq/L (ref 96–112)
Creatinine, Ser: 0.77 mg/dL (ref 0.50–1.10)
GFR calc Af Amer: >90 mL/min (ref >90)
GFR calc non Af Amer: 80 mL/min — ABNORMAL LOW (ref >90)
Glucose, Bld: 140 mg/dL — ABNORMAL HIGH (ref 70–99)
Potassium: 4.4 mEq/L (ref 3.7–5.3)
Sodium: 139 mEq/L (ref 137–147)

## 2013-08-20 LAB — CBC WITH DIFFERENTIAL/PLATELET
Basophils Absolute: 0 10*3/uL (ref 0.0–0.1)
Basophils Relative: 0 % (ref 0–1)
Eosinophils Absolute: 0.1 10*3/uL (ref 0.0–0.7)
Eosinophils Relative: 2 % (ref 0–5)
HCT: 40.1 % (ref 36.0–46.0)
Hemoglobin: 13.2 g/dL (ref 12.0–15.0)
Lymphocytes Relative: 22 % (ref 12–46)
Lymphs Abs: 1.7 10*3/uL (ref 0.7–4.0)
MCH: 27.5 pg (ref 26.0–34.0)
MCHC: 32.9 g/dL (ref 30.0–36.0)
MCV: 83.5 fL (ref 78.0–100.0)
Monocytes Absolute: 0.4 10*3/uL (ref 0.1–1.0)
Monocytes Relative: 5 % (ref 3–12)
Neutro Abs: 5.6 10*3/uL (ref 1.7–7.7)
Neutrophils Relative %: 71 % (ref 43–77)
Platelets: 228 10*3/uL (ref 150–400)
RBC: 4.8 MIL/uL (ref 3.87–5.11)
RDW: 13.3 % (ref 11.5–15.5)
WBC: 7.9 10*3/uL (ref 4.0–10.5)

## 2013-08-20 LAB — URINALYSIS, ROUTINE W REFLEX MICROSCOPIC
Bilirubin Urine: NEGATIVE
Glucose, UA: NEGATIVE mg/dL
Hgb urine dipstick: NEGATIVE
Ketones, ur: NEGATIVE mg/dL
Leukocytes, UA: NEGATIVE
Nitrite: NEGATIVE
Protein, ur: NEGATIVE mg/dL
Specific Gravity, Urine: 1.01 (ref 1.005–1.030)
Urobilinogen, UA: 0.2 mg/dL (ref 0.0–1.0)
pH: 6.5 (ref 5.0–8.0)

## 2013-08-20 LAB — TROPONIN I
Troponin I: <0.3 ng/mL (ref <0.30)
Troponin I: <0.3 ng/mL (ref <0.30)

## 2013-08-20 MED ORDER — CLORAZEPATE DIPOTASSIUM 7.5 MG PO TABS
7.5000 mg | ORAL_TABLET | Freq: Every evening | ORAL | Status: DC | PRN
  Administered 2013-08-20: 7.5 mg via ORAL
  Filled 2013-08-20: qty 1

## 2013-08-20 MED ORDER — DEXTROSE-NACL 5-0.45 % IV SOLN
INTRAVENOUS | Status: DC
  Administered 2013-08-20: 22:00:00 via INTRAVENOUS

## 2013-08-20 MED ORDER — TRIMETHOPRIM 100 MG PO TABS
100.0000 mg | ORAL_TABLET | Freq: Every day | ORAL | Status: DC
  Administered 2013-08-20: 100 mg via ORAL
  Filled 2013-08-20 (×4): qty 1

## 2013-08-20 MED ORDER — GI COCKTAIL ~~LOC~~: 30.0000 mL | Freq: Four times a day (QID) | ORAL | Status: DC | PRN

## 2013-08-20 MED ORDER — NITROGLYCERIN 2 % TD OINT
1.0000 [in_us] | TOPICAL_OINTMENT | Freq: Four times a day (QID) | TRANSDERMAL | Status: DC
  Administered 2013-08-21 (×2): 1 [in_us] via TOPICAL
  Filled 2013-08-20 (×2): qty 1

## 2013-08-20 MED ORDER — CARVEDILOL 12.5 MG PO TABS
12.5000 mg | ORAL_TABLET | Freq: Two times a day (BID) | ORAL | Status: DC
  Administered 2013-08-20 – 2013-08-21 (×2): 12.5 mg via ORAL
  Filled 2013-08-20 (×2): qty 1

## 2013-08-20 MED ORDER — LISINOPRIL 10 MG PO TABS
10.0000 mg | ORAL_TABLET | Freq: Every day | ORAL | Status: DC
  Administered 2013-08-21: 10 mg via ORAL
  Filled 2013-08-20: qty 1

## 2013-08-20 MED ORDER — TRAMADOL HCL 50 MG PO TABS: 50.0000 mg | ORAL_TABLET | Freq: Every day | ORAL | Status: DC | PRN

## 2013-08-20 MED ORDER — IOHEXOL 350 MG/ML SOLN
100.0000 mL | Freq: Once | INTRAVENOUS | Status: AC | PRN
  Administered 2013-08-20: 100 mL via INTRAVENOUS

## 2013-08-20 MED ORDER — ASPIRIN EC 325 MG PO TBEC
325.0000 mg | DELAYED_RELEASE_TABLET | Freq: Every day | ORAL | Status: DC
  Administered 2013-08-20 – 2013-08-21 (×2): 325 mg via ORAL
  Filled 2013-08-20 (×2): qty 1

## 2013-08-20 MED ORDER — MORPHINE SULFATE 2 MG/ML IJ SOLN: 2.0000 mg | INTRAMUSCULAR | Status: DC | PRN

## 2013-08-20 MED ORDER — HEPARIN SODIUM (PORCINE) 5000 UNIT/ML IJ SOLN
5000.0000 [IU] | Freq: Three times a day (TID) | INTRAMUSCULAR | Status: DC
  Administered 2013-08-20 – 2013-08-21 (×2): 5000 [IU] via SUBCUTANEOUS
  Filled 2013-08-20 (×2): qty 1

## 2013-08-20 NOTE — ED Notes (Signed)
Pt slightly SOB with movement

## 2013-08-20 NOTE — Progress Notes (Signed)
Called to get report. RN states she has something else to do.

## 2013-08-20 NOTE — ED Notes (Signed)
Complain of chest pain that started three days ago. Worse today. Pt took 324 mg asa this morning

## 2013-08-20 NOTE — ED Provider Notes (Signed)
CSN: [id number]     Arrival date & time 08/20/13  1501 History  This chart was scribed for Sharyon Cable, MD by Delphia Grates, ED Scribe. This patient was seen in room APA19/[facility]19 and the patient's care was started at 3:35 PM.   Chief Complaint  Patient presents with  . Chest Pain    Patient is a 78 y.o. female presenting with chest pain. The history is provided by the patient. No language interpreter was used.  Chest Pain Pain quality: stabbing   Pain radiates to:  Does not radiate Pain radiates to the back: no   Pain severity:  Moderate Duration:  1 week Progression:  Worsening Worsened by:  Deep breathing Associated symptoms: nausea and weakness   Associated symptoms: no shortness of breath, no syncope and not vomiting   Risk factors: hypertension     HPI Comments: [name] is a 78 y.o. female with history of thoracic aneurysm who presents to the Emergency Department complaining of progressively worsening chest pain that began 1 week ago and worsened today. Pt describes the pain as stabbing. Pt states the pain is occasionally worse with deep breathing. She reports associated nausea, generalized weakness, occasional SOB, and light-headedness. She denies LOC, emesis, focal weakness. She reports chronic bloody and black stools but denies recent changes. She is currently taking ASA. She is not on oxygen, currently.    Past Medical History  Diagnosis Date  . Aneurysm   . Liver mass   . Lung, cysts, congenital     left lung cyst  . Kidney stone     embedded  . Hemorrhoids   . Anxiety   . Hypertension     Past Surgical History  Procedure Laterality Date  . Tonsillectomy and adenoidectomy    . Complete hysterectomy    . Benign breast tumors    . Cholecystectomy    . Colonoscopy      Family History  Problem Relation Age of Onset  . Heart disease    . Arthritis    . Lung disease    . Cancer    . Asthma    . Diabetes    . Kidney disease      History   Substance Use Topics  . Smoking status: Former Research scientist (life sciences)  . Smokeless tobacco: Not on file  . Alcohol Use: No    OB History   Grav Para Term Preterm Abortions TAB SAB Ect Mult Living                   Review of Systems  Respiratory: Negative for shortness of breath.   Cardiovascular: Positive for chest pain. Negative for syncope.  Gastrointestinal: Positive for nausea. Negative for vomiting.  Genitourinary: Positive for dysuria.  Neurological: Positive for weakness. Negative for syncope.  All other systems reviewed and are negative.     Allergies  Alprazolam; Codeine; Percodan; and Valium  Home Medications   Current Outpatient Rx  Name  Route  Sig  Dispense  Refill  . aspirin EC 81 MG tablet   Oral   Take 81 mg by mouth every morning.          . carvedilol (COREG) 12.5 MG tablet   Oral   Take 1 tablet (12.5 mg total) by mouth 2 (two) times daily.   180 tablet   3   . clorazepate (TRANXENE) 7.5 MG tablet   Oral   Take 7.5 mg by mouth as needed. For nerves         .  dicyclomine (BENTYL) 10 MG capsule   Oral   Take 10 mg by mouth 2 (two) times daily.         Marland Kitchen lisinopril (PRINIVIL,ZESTRIL) 10 MG tablet   Oral   Take 1 tablet (10 mg total) by mouth daily.   90 tablet   3   . traMADol (ULTRAM) 50 MG tablet   Oral   Take 50 mg by mouth as needed. Maximum dose= 8 tablets per day          . trimethoprim (TRIMPEX) 100 MG tablet   Oral   Take 100 mg by mouth at bedtime.            Temp(Src) 97.5 F (36.4 C) (Oral)  Resp 18  SpO2 98% BP 171/76  Pulse 83  Temp(Src) 98 F (36.7 C) (Oral)  Resp 16  SpO2 97%    Physical Exam  CONSTITUTIONAL: Well developed/well nourished HEAD: Normocephalic/atraumatic EYES: EOMI/PERRL ENMT: Mucous membranes moist NECK: supple no meningeal signs SPINE:entire spine nontender CV: S1/S2 noted, no murmurs/rubs/gallops noted LUNGS: Lungs are clear to auscultation bilaterally, no apparent distress ABDOMEN: soft,  nontender, no rebound or guarding GU:no cva tenderness NEURO: Pt is awake/alert, moves all extremitiesx4 EXTREMITIES: pulses normal, full ROM, no calf tenderness or edema, distal pulses equal x4. SKIN: warm, color normal PSYCH: no abnormalities of mood noted   ED Course  Procedures  DIAGNOSTIC STUDIES: Oxygen Saturation is 98% on room air, normal by my interpretation.    COORDINATION OF CARE: 3:39 PM- Pt has h/o thoracic aneurysm.  She reports stabbing type CP at times.  I am concerned for worsening aneurysm.  Will obtain CT chest.  Pt does not want any pain meds at this time.   5:52 PM CT chest negative Pt reports CP improved Will admit for CP evaluation D/w dr Humphrey Rolls, will admit  pt reports she has taken ASA today Labs Review  Labs Reviewed  BASIC METABOLIC PANEL - Abnormal; Notable for the following:    Glucose, Bld 140 (*)    GFR calc non Af Amer 80 (*)    All other components within normal limits  CBC WITH DIFFERENTIAL  URINALYSIS, ROUTINE W REFLEX MICROSCOPIC  TROPONIN I  Randolm Idol, ED    Imaging Review Dg Chest Portable 1 View  08/20/2013   CLINICAL DATA:  78 year old female chest pain. Initial encounter.  EXAM: PORTABLE CHEST - 1 VIEW  COMPARISON:  Chest CTA 06/10/2013 and earlier.  FINDINGS: Portable AP upright view at 1526 hr. Stable lung volumes. Stable cardiomegaly and mediastinal contours. No pneumothorax, pulmonary edema, pleural effusion or consolidation. Stable postoperative changes to the left humerus and C-spine.  IMPRESSION: No acute cardiopulmonary abnormality.   Electronically Signed   By: Lars Pinks M.D.   On: 08/20/2013 15:32     Date: 08/20/2013  Rate: 73  Rhythm: normal sinus rhythm  QRS Axis: normal  Intervals: normal  ST/T Wave abnormalities: nonspecific ST changes  Conduction Disutrbances:left bundle branch block  Narrative Interpretation:   Old EKG Reviewed: unchanged LBBB is unchanged    MDM   Final diagnoses:  Chest pain, rule  out acute myocardial infarction  Thyroid nodule  Pancreatic lesion    Nursing notes including past medical history and social history reviewed and considered in documentation xrays reviewed and considered Labs/vital reviewed and considered Previous records reviewed and considered     I personally performed the services described in this documentation, which was scribed in my presence. The recorded information has been  reviewed and is accurate.      Sharyon Cable, MD 08/20/13 [phone]

## 2013-08-20 NOTE — H&P (Signed)
[facility] History and Physical  Arnold Depinto Arntz AYT:[id number] DOB: [birth year] DOA: 08/20/2013  Referring physician: Ripley Fraise, MD PCP: Alonza Bogus, MD   Chief Complaint: Chest Pain  HPI: [name] is a 78 y.o. female presents to the ED with complaints of chest pain. Patient states that the pain has been going on for over a week. She states there seems to be a relationship with her breathing. She states that she has had some radiation to the left shoulder and she has had radiation to the jaw. It appears that the pain is reproducible. She states it is a sharp pain. Seems to be worse with her breathing. She has taken 4 aspirins already and the pain appears to have relieved a little bit. She does have a history of a thoracic aneurysm and so a CT of the chest was ordered to look for dissection. This was apparently negative.   Review of Systems:  Constitutional:  No weight loss, night sweats, Fevers, chills, fatigue.  HEENT:  No headaches, Difficulty swallowing,Tooth/dental problems,Sore throat,  No sneezing, itching, ear ache, nasal congestion, post nasal drip,  Cardio-vascular:  ++chest pain, Orthopnea, PND, swelling in lower extremities, anasarca, dizziness, palpitations  GI:  No heartburn, indigestion, abdominal pain, nausea, vomiting, diarrhea, change in bowel habits, loss of appetite  Resp:  ++ shortness of breath with exertion or at rest. No excess mucus, no productive cough, No non-productive cough, No coughing up of blood.No change in color of mucus.No wheezing.No chest wall deformity  Skin:  no rash or lesions.  GU:  no dysuria, change in color of urine, no urgency or frequency. No flank pain.  Musculoskeletal:  No joint pain or swelling. Reproducible pain in chest. No decreased range of motion. No back pain.  Psych:  No change in mood or affect. No depression or anxiety. No memory loss.   Past Medical History  Diagnosis Date  . Aneurysm   . Liver mass     . Lung, cysts, congenital     left lung cyst  . Kidney stone     embedded  . Hemorrhoids   . Anxiety   . Hypertension    Past Surgical History  Procedure Laterality Date  . Tonsillectomy and adenoidectomy    . Complete hysterectomy    . Benign breast tumors    . Cholecystectomy    . Colonoscopy     Social History:  reports that she has quit smoking. She does not have any smokeless tobacco history on file. She reports that she does not drink alcohol or use illicit drugs.  Allergies  Allergen Reactions  . Alprazolam Nausea And Vomiting  . Codeine Nausea And Vomiting  . Percodan [Oxycodone-Aspirin] Nausea And Vomiting  . Valium Nausea And Vomiting    Family History  Problem Relation Age of Onset  . Heart disease    . Arthritis    . Lung disease    . Cancer    . Asthma    . Diabetes    . Kidney disease       Prior to Admission medications   Medication Sig Start Date End Date Taking? Authorizing Provider  aspirin EC 81 MG tablet Take 81 mg by mouth every morning.    Yes Historical Provider, MD  carvedilol (COREG) 12.5 MG tablet Take 1 tablet (12.5 mg total) by mouth 2 (two) times daily. 06/09/13  Yes Herminio Commons, MD  clorazepate (TRANXENE) 7.5 MG tablet Take 7.5 mg by mouth daily as needed  for anxiety. For nerves   Yes Historical Provider, MD  lisinopril (PRINIVIL,ZESTRIL) 10 MG tablet Take 1 tablet (10 mg total) by mouth daily. 07/17/13  Yes Herminio Commons, MD  traMADol (ULTRAM) 50 MG tablet Take 50 mg by mouth daily as needed for moderate pain or severe pain. Maximum dose= 8 tablets per day   Yes Historical Provider, MD  trimethoprim (TRIMPEX) 100 MG tablet Take 100 mg by mouth at bedtime.     Yes Historical Provider, MD   Physical Exam: Filed Vitals:   08/20/13 1840  BP: 151/65  Pulse: 74  Temp:   Resp: 16    BP 151/65  Pulse 74  Temp(Src) 98 F (36.7 C) (Oral)  Resp 16  SpO2 97%  General:  Appears calm and comfortable Eyes: PERRL, normal lids,  irises & conjunctiva ENT: grossly normal hearing, lips & tongue Neck: no LAD, masses or thyromegaly Cardiovascular: RRR, no m/r/g. No LE edema. Reproducible chest wall pain Telemetry: SR, no arrhythmias  Respiratory: CTA bilaterally, no w/r/r. Normal respiratory effort. Abdomen: soft, ntnd Skin: no rash or induration seen on limited exam Musculoskeletal: grossly normal tone BUE/BLE Psychiatric: grossly normal mood and affect, speech fluent and appropriate Neurologic: grossly non-focal.          Labs on Admission:  Basic Metabolic Panel:  Recent Labs Lab 08/20/13 1522  NA 139  K 4.4  CL 100  CO2 30  GLUCOSE 140*  BUN 11  CREATININE 0.77  CALCIUM 10.0   Liver Function Tests: No results found for this basename: AST, ALT, ALKPHOS, BILITOT, PROT, ALBUMIN,  in the last 168 hours No results found for this basename: LIPASE, AMYLASE,  in the last 168 hours No results found for this basename: AMMONIA,  in the last 168 hours CBC:  Recent Labs Lab 08/20/13 1522  WBC 7.9  NEUTROABS 5.6  HGB 13.2  HCT 40.1  MCV 83.5  PLT 228   Cardiac Enzymes:  Recent Labs Lab 08/20/13 1522  TROPONINI <0.30    BNP (last 3 results) No results found for this basename: PROBNP,  in the last 8760 hours CBG: No results found for this basename: GLUCAP,  in the last 168 hours  Radiological Exams on Admission: Dg Chest Portable 1 View  08/20/2013   CLINICAL DATA:  78 year old female chest pain. Initial encounter.  EXAM: PORTABLE CHEST - 1 VIEW  COMPARISON:  Chest CTA 06/10/2013 and earlier.  FINDINGS: Portable AP upright view at 1526 hr. Stable lung volumes. Stable cardiomegaly and mediastinal contours. No pneumothorax, pulmonary edema, pleural effusion or consolidation. Stable postoperative changes to the left humerus and C-spine.  IMPRESSION: No acute cardiopulmonary abnormality.   Electronically Signed   By: Lars Pinks M.D.   On: 08/20/2013 15:32   Ct Angio Chest Aorta W/cm &/or  Wo/cm  08/20/2013   CLINICAL DATA:  Three day history of chest pain with history of thoracic aortic aneurysm  EXAM: CT ANGIOGRAPHY CHEST WITH CONTRAST  TECHNIQUE: Multidetector CT imaging of the chest was performed using the standard protocol during bolus administration of intravenous contrast. Multiplanar CT image reconstructions and MIPs were obtained to evaluate the vascular anatomy.  CONTRAST:  1109mL OMNIPAQUE IOHEXOL 350 MG/ML SOLN  COMPARISON:  DG CHEST 1V PORT dated 08/20/2013; CT ANGIO CHEST W/CM &/OR WO/CM dated 06/10/2013  FINDINGS: The thoracic aorta measures 4.4 cm and tapers normally into the aortic arch. The descending thoracic aorta exhibits no aneurysm or false lumen. There is no evidence of a periaortic hematoma.  Contrast within the pulmonary arterial tree is normal in appearance. There are no filling defects to suggest an acute pulmonary embolism. The cardiac chambers are mildly enlarged though stable. There is no pleural or pericardial effusion. The retrosternal soft tissues appear normal. There is no mediastinal or hilar lymphadenopathy. The right thyroid lobe demonstrates an ill-defined nodule measuring at least 1.5 cm in diameter.  At lung window settings there are no interstitial or alveolar infiltrates. There is minimal bibasilar scarring. No pulmonary parenchymal nodules or masses are demonstrated.  Within the upper abdomen there is a stable hypodensity posteriorly in the right lobe with peripheral enhancement consistent with a hemangioma. There is a subcentimeter hypodensity anteriorly on image 91 that likely reflects a cyst. The observed portions of the spleen and adrenal glands appear normal. On the lower most image of the scan the pancreatic head demonstrates low-density but this is only partially evaluated on this study. There is small hiatal hernia.  The thoracic vertebral bodies are preserved in height. There is degenerative disc change at multiple levels and there is calcification of  the anterior longitudinal ligament.  Review of the MIP images confirms the above findings.  IMPRESSION: 1. There is a stable appearance of the mildly dilated aortic root which measures 4.4 cm. This tapers into the aortic arch, and the descending thoracic aorta is normal in caliber. There is no false lumen. 2. There is no evidence of an acute pulmonary embolism. 3. There is stable mild enlargement of the cardiac chambers. There is no pleural or pericardial effusion. 4. There is no evidence of pneumonia or other acute pulmonary parenchymal abnormality. 5. On the lower most axial image the pancreatic head demonstrates low density. This merits further evaluation with a CT scan through the pancreas when the patient can tolerate the procedure. 6. There is a hypodense thyroid nodule on the right measuring only is 1.5 cm. 7. There is moderate degenerative disc change of the thoracic spine.   Electronically Signed   By: David  Martinique   On: 08/20/2013 17:29    EKG: Independently reviewed. No acute changes  Assessment/Plan Principal Problem:   Chest pain Active Problems:   Aneurysm   1. Chest Pain -very atypical -she will be admitted for rule out protocol -nitropaste applied to CW -continue home medications -will give aspirin -echo ordered for AM  2. Aortic Aneursysm -scanned in the ED there is no change in the appearance of the aneurysm  3. Hypertension -needs better control -will titrate medications -nitropaste applied to chest wall   Code Status: Full Code (must indicate code status--if unknown or must be presumed, indicate so) Family Communication: Daughter in room (indicate person spoken with, if applicable, with phone number if by telephone) Disposition Plan: Home (indicate anticipated LOS)  Time spent: 47min  Esvin Hnat A Harry Bark [facility] Pager [phone]

## 2013-08-21 ENCOUNTER — Observation Stay (HOSPITAL_COMMUNITY): Payer: Medicare Other

## 2013-08-21 ENCOUNTER — Encounter (HOSPITAL_COMMUNITY): Payer: Self-pay | Admitting: Radiology

## 2013-08-21 DIAGNOSIS — N281 Cyst of kidney, acquired: Secondary | ICD-10-CM | POA: Diagnosis not present

## 2013-08-21 DIAGNOSIS — R079 Chest pain, unspecified: Secondary | ICD-10-CM | POA: Diagnosis not present

## 2013-08-21 DIAGNOSIS — I359 Nonrheumatic aortic valve disorder, unspecified: Secondary | ICD-10-CM

## 2013-08-21 LAB — TROPONIN I
Troponin I: <0.3 ng/mL (ref <0.30)
Troponin I: <0.3 ng/mL (ref <0.30)

## 2013-08-21 MED ORDER — IOHEXOL 300 MG/ML  SOLN
100.0000 mL | Freq: Once | INTRAMUSCULAR | Status: AC | PRN
  Administered 2013-08-21: 100 mL via INTRAVENOUS

## 2013-08-21 NOTE — Progress Notes (Signed)
*  PRELIMINARY RESULTS* Echocardiogram 2D Echocardiogram has been performed.  [name] 08/21/2013, 12:09 PM

## 2013-08-21 NOTE — Progress Notes (Signed)
Dr. Luan Pulling in to see patient, notified about patient's ow blood pressure. New order to d/c Nitro patch.

## 2013-08-21 NOTE — Progress Notes (Signed)
Patient with orders to be discharge. Discharge instructions given, verbalized understanding. Patient stable. Patient left with daughter in private vehicle.

## 2013-08-21 NOTE — Progress Notes (Signed)
Subjective: She was admitted with atypical chest pain. She has an aortic aneurysm but it is unchanged. She has ruled out for myocardial infarction. She had abnormality on her chest CT in the area of the pancreas it is has been suggested that she have a pancreatic CT.  Objective: Vital signs in last 24 hours: Temp:  [97.5 F (36.4 C)-98.2 F (36.8 C)] 97.8 F (36.6 C) (04/10 0416) Pulse Rate:  [64-83] 64 (04/10 0416) Resp:  [16-18] 16 (04/10 0416) BP: (96-171)/(42-110) 118/63 mmHg (04/10 0815) SpO2:  [92 %-98 %] 94 % (04/10 0416) Weight:  [82.5 kg (181 lb 14.1 oz)] 82.5 kg (181 lb 14.1 oz) (04/09 2100) Weight change:  Last BM Date: 08/20/13  Intake/Output from previous day:    PHYSICAL EXAM General appearance: alert, cooperative and mild distress Resp: clear to auscultation bilaterally Cardio: regular rate and rhythm, S1, S2 normal, no murmur, click, rub or gallop GI: soft, non-tender; bowel sounds normal; no masses,  no organomegaly Extremities: extremities normal, atraumatic, no cyanosis or edema  Lab Results:    Basic Metabolic Panel:  Recent Labs  08/20/13 1522  NA 139  K 4.4  CL 100  CO2 30  GLUCOSE 140*  BUN 11  CREATININE 0.77  CALCIUM 10.0   Liver Function Tests: No results found for this basename: AST, ALT, ALKPHOS, BILITOT, PROT, ALBUMIN,  in the last 72 hours No results found for this basename: LIPASE, AMYLASE,  in the last 72 hours No results found for this basename: AMMONIA,  in the last 72 hours CBC:  Recent Labs  08/20/13 1522  WBC 7.9  NEUTROABS 5.6  HGB 13.2  HCT 40.1  MCV 83.5  PLT 228   Cardiac Enzymes:  Recent Labs  08/20/13 1522 08/20/13 2142 08/21/13 0330  TROPONINI <0.30 <0.30 <0.30   BNP: No results found for this basename: PROBNP,  in the last 72 hours D-Dimer: No results found for this basename: DDIMER,  in the last 72 hours CBG: No results found for this basename: GLUCAP,  in the last 72 hours Hemoglobin A1C: No  results found for this basename: HGBA1C,  in the last 72 hours Fasting Lipid Panel: No results found for this basename: CHOL, HDL, LDLCALC, TRIG, CHOLHDL, LDLDIRECT,  in the last 72 hours Thyroid Function Tests: No results found for this basename: TSH, T4TOTAL, FREET4, T3FREE, THYROIDAB,  in the last 72 hours Anemia Panel: No results found for this basename: VITAMINB12, FOLATE, FERRITIN, TIBC, IRON, RETICCTPCT,  in the last 72 hours Coagulation: No results found for this basename: LABPROT, INR,  in the last 72 hours Urine Drug Screen: Drugs of Abuse  No results found for this basename: labopia, cocainscrnur, labbenz, amphetmu, thcu, labbarb    Alcohol Level: No results found for this basename: ETH,  in the last 72 hours Urinalysis:  Recent Labs  08/20/13 1610  COLORURINE YELLOW  LABSPEC 1.010  PHURINE 6.5  GLUCOSEU NEGATIVE  HGBUR NEGATIVE  BILIRUBINUR NEGATIVE  KETONESUR NEGATIVE  PROTEINUR NEGATIVE  UROBILINOGEN 0.2  [place]. Labs:  ABGS No results found for this basename: PHART, PCO2, PO2ART, TCO2, HCO3,  in the last 72 hours CULTURES No results found for this or any previous visit (from the past 240 hour(s)). Studies/Results: Dg Chest Portable 1 View  08/20/2013   CLINICAL DATA:  78 year old female chest pain. Initial encounter.  EXAM: PORTABLE CHEST - 1 VIEW  COMPARISON:  Chest CTA 06/10/2013 and earlier.  FINDINGS: Portable AP upright  view at 1526 hr. Stable lung volumes. Stable cardiomegaly and mediastinal contours. No pneumothorax, pulmonary edema, pleural effusion or consolidation. Stable postoperative changes to the left humerus and C-spine.  IMPRESSION: No acute cardiopulmonary abnormality.   Electronically Signed   By: Lars Pinks M.D.   On: 08/20/2013 15:32   Ct Angio Chest Aorta W/cm &/or Wo/cm  08/20/2013   CLINICAL DATA:  Three day history of chest pain with history of thoracic aortic aneurysm  EXAM: CT ANGIOGRAPHY CHEST  WITH CONTRAST  TECHNIQUE: Multidetector CT imaging of the chest was performed using the standard protocol during bolus administration of intravenous contrast. Multiplanar CT image reconstructions and MIPs were obtained to evaluate the vascular anatomy.  CONTRAST:  124mL OMNIPAQUE IOHEXOL 350 MG/ML SOLN  COMPARISON:  DG CHEST 1V PORT dated 08/20/2013; CT ANGIO CHEST W/CM &/OR WO/CM dated 06/10/2013  FINDINGS: The thoracic aorta measures 4.4 cm and tapers normally into the aortic arch. The descending thoracic aorta exhibits no aneurysm or false lumen. There is no evidence of a periaortic hematoma. Contrast within the pulmonary arterial tree is normal in appearance. There are no filling defects to suggest an acute pulmonary embolism. The cardiac chambers are mildly enlarged though stable. There is no pleural or pericardial effusion. The retrosternal soft tissues appear normal. There is no mediastinal or hilar lymphadenopathy. The right thyroid lobe demonstrates an ill-defined nodule measuring at least 1.5 cm in diameter.  At lung window settings there are no interstitial or alveolar infiltrates. There is minimal bibasilar scarring. No pulmonary parenchymal nodules or masses are demonstrated.  Within the upper abdomen there is a stable hypodensity posteriorly in the right lobe with peripheral enhancement consistent with a hemangioma. There is a subcentimeter hypodensity anteriorly on image 91 that likely reflects a cyst. The observed portions of the spleen and adrenal glands appear normal. On the lower most image of the scan the pancreatic head demonstrates low-density but this is only partially evaluated on this study. There is small hiatal hernia.  The thoracic vertebral bodies are preserved in height. There is degenerative disc change at multiple levels and there is calcification of the anterior longitudinal ligament.  Review of the MIP images confirms the above findings.  IMPRESSION: 1. There is a stable appearance of  the mildly dilated aortic root which measures 4.4 cm. This tapers into the aortic arch, and the descending thoracic aorta is normal in caliber. There is no false lumen. 2. There is no evidence of an acute pulmonary embolism. 3. There is stable mild enlargement of the cardiac chambers. There is no pleural or pericardial effusion. 4. There is no evidence of pneumonia or other acute pulmonary parenchymal abnormality. 5. On the lower most axial image the pancreatic head demonstrates low density. This merits further evaluation with a CT scan through the pancreas when the patient can tolerate the procedure. 6. There is a hypodense thyroid nodule on the right measuring only is 1.5 cm. 7. There is moderate degenerative disc change of the thoracic spine.   Electronically Signed   By: David  Martinique   On: 08/20/2013 17:29    Medications:  Prior to Admission:  Prescriptions prior to admission  Medication Sig Dispense Refill  . aspirin EC 81 MG tablet Take 81 mg by mouth every morning.       . carvedilol (COREG) 12.5 MG tablet Take 1 tablet (12.5 mg total) by mouth 2 (two) times daily.  180 tablet  3  . clorazepate (TRANXENE) 7.5 MG tablet Take 7.5 mg  by mouth daily as needed for anxiety. For nerves      . lisinopril (PRINIVIL,ZESTRIL) 10 MG tablet Take 1 tablet (10 mg total) by mouth daily.  90 tablet  3  . traMADol (ULTRAM) 50 MG tablet Take 50 mg by mouth daily as needed for moderate pain or severe pain. Maximum dose= 8 tablets per day      . trimethoprim (TRIMPEX) 100 MG tablet Take 100 mg by mouth at bedtime.         Scheduled: . aspirin EC  325 mg Oral Daily  . carvedilol  12.5 mg Oral BID WC  . heparin subcutaneous  5,000 Units Subcutaneous 3 times per day  . lisinopril  10 mg Oral Daily  . nitroGLYCERIN  1 inch Topical 4 times per day  . trimethoprim  100 mg Oral QHS   Continuous: . dextrose 5 % and 0.45% NaCl 10 mL/hr at 08/20/13 2205   OBS:[id number], gi cocktail, morphine injection,  traMADol  Assesment: She has chest pain and has ruled out for myocardial infarction. This was very atypical and I don't think it represented cardiac pain. She has an aortic aneurysm which by CT is unchanged. She has an abnormality in her pancreas and that's going to be evaluated. She has hypertension which is pretty well controlled and actually somewhat low today I think related to nitroglycerin paste. She has 3 severe anxiety and depression which are unchanged Principal Problem:   Chest pain Active Problems:   Aneurysm   Hypertension    Plan: She will have CT abdomen pancreatic protocol and if it's okay she will be discharged    LOS: 1 day   Alonza Bogus 08/21/2013, 8:50 AM

## 2013-08-21 NOTE — Progress Notes (Signed)
UR completed 

## 2013-08-22 NOTE — Discharge Summary (Signed)
Physician Discharge Summary  Patient ID: [name] MRN: [id number] DOB/AGE: 06-24-1935 78 y.o. Primary Care Physician:Banks Chaikin L, MD Admit date: 08/20/2013 Discharge date: 08/22/2013    Discharge Diagnoses:   Principal Problem:   Chest pain Active Problems:   Aneurysm   Hypertension  Anxiety   Medication List         aspirin EC 81 MG tablet  Take 81 mg by mouth every morning.     carvedilol 12.5 MG tablet  Commonly known as:  COREG  Take 1 tablet (12.5 mg total) by mouth 2 (two) times daily.     clorazepate 7.5 MG tablet  Commonly known as:  TRANXENE  Take 7.5 mg by mouth daily as needed for anxiety. For nerves     lisinopril 10 MG tablet  Commonly known as:  PRINIVIL,ZESTRIL  Take 1 tablet (10 mg total) by mouth daily.     traMADol 50 MG tablet  Commonly known as:  ULTRAM  Take 50 mg by mouth daily as needed for moderate pain or severe pain. Maximum dose= 8 tablets per day     trimethoprim 100 MG tablet  Commonly known as:  TRIMPEX  Take 100 mg by mouth at bedtime.        Discharged Condition: Improved    Consults: None  Significant Diagnostic Studies: Ct Abd Wo & W Cm  08/21/2013   CLINICAL DATA:  Abnormal CT chest with question low-attenuation lesion at pancreatic head  EXAM: CT ABDOMEN WITHOUT AND WITH CONTRAST  TECHNIQUE: Multidetector CT imaging of the abdomen was performed following the standard protocol before and following the bolus administration of intravenous contrast. Sagittal and coronal MPR images reconstructed from axial data set.  CONTRAST:  140mL OMNIPAQUE IOHEXOL 300 MG/ML SOLN IV. Oral contrast not administered.  COMPARISON:  CT chest 08/20/2013, CT abdomen 04/17/2011  FINDINGS: Minimal atelectasis versus scarring at lung bases.  Normal precontrast appearance of appendix without mass, calcification or ductal dilatation.  Normal enhancement of pancreas post contrast without mass.  Area of questioned low-attenuation on CT chest appears  to be due to a combination of mildly prominent distal CBD post cholecystectomy, fluid in adjacent duodenum, and volume averaging.  No peripancreatic edema or regional fluid collections.  Nonspecific low-attenuation focus anteriorly in liver 8 x 6 mm.  Abnormal area of mass in enhancement at the posterior aspect of the right lobe liver, approximately 3.9 x 2.6 cm series 2, image 16, demonstrating nodular peripheral enhancement and complete fill-in on delayed images question hemangioma.  Cyst at posterior mid left kidney 3.5 x 3.4 cm image 53.  Remainder of liver, spleen, pancreas, kidneys, and adrenal glands normal.  Small amount of excreted contrast material within renal collecting systems on precontrast images due to CT chest with contrast on 08/20/2013.  Stomach and bowel loops unremarkable.  No mass, adenopathy or free fluid.  Atherosclerotic calcification aorta.  Bones appear demineralized.  IMPRESSION: No focal pancreatic abnormalities identified.  Mass at posterior right lobe liver 3.9 x 2.6 cm with enhancement characteristics most suggestive of a hepatic hemangioma, unchanged since 2012.   Electronically Signed   By: Lavonia Dana M.D.   On: 08/21/2013 10:52   Dg Chest Portable 1 View  08/20/2013   CLINICAL DATA:  78 year old female chest pain. Initial encounter.  EXAM: PORTABLE CHEST - 1 VIEW  COMPARISON:  Chest CTA 06/10/2013 and earlier.  FINDINGS: Portable AP upright view at 1526 hr. Stable lung volumes. Stable cardiomegaly and mediastinal contours. No pneumothorax, pulmonary edema,  pleural effusion or consolidation. Stable postoperative changes to the left humerus and C-spine.  IMPRESSION: No acute cardiopulmonary abnormality.   Electronically Signed   By: Lars Pinks M.D.   On: 08/20/2013 15:32   Ct Angio Chest Aorta W/cm &/or Wo/cm  08/20/2013   CLINICAL DATA:  Three day history of chest pain with history of thoracic aortic aneurysm  EXAM: CT ANGIOGRAPHY CHEST WITH CONTRAST  TECHNIQUE: Multidetector CT  imaging of the chest was performed using the standard protocol during bolus administration of intravenous contrast. Multiplanar CT image reconstructions and MIPs were obtained to evaluate the vascular anatomy.  CONTRAST:  110mL OMNIPAQUE IOHEXOL 350 MG/ML SOLN  COMPARISON:  DG CHEST 1V PORT dated 08/20/2013; CT ANGIO CHEST W/CM &/OR WO/CM dated 06/10/2013  FINDINGS: The thoracic aorta measures 4.4 cm and tapers normally into the aortic arch. The descending thoracic aorta exhibits no aneurysm or false lumen. There is no evidence of a periaortic hematoma. Contrast within the pulmonary arterial tree is normal in appearance. There are no filling defects to suggest an acute pulmonary embolism. The cardiac chambers are mildly enlarged though stable. There is no pleural or pericardial effusion. The retrosternal soft tissues appear normal. There is no mediastinal or hilar lymphadenopathy. The right thyroid lobe demonstrates an ill-defined nodule measuring at least 1.5 cm in diameter.  At lung window settings there are no interstitial or alveolar infiltrates. There is minimal bibasilar scarring. No pulmonary parenchymal nodules or masses are demonstrated.  Within the upper abdomen there is a stable hypodensity posteriorly in the right lobe with peripheral enhancement consistent with a hemangioma. There is a subcentimeter hypodensity anteriorly on image 91 that likely reflects a cyst. The observed portions of the spleen and adrenal glands appear normal. On the lower most image of the scan the pancreatic head demonstrates low-density but this is only partially evaluated on this study. There is small hiatal hernia.  The thoracic vertebral bodies are preserved in height. There is degenerative disc change at multiple levels and there is calcification of the anterior longitudinal ligament.  Review of the MIP images confirms the above findings.  IMPRESSION: 1. There is a stable appearance of the mildly dilated aortic root which  measures 4.4 cm. This tapers into the aortic arch, and the descending thoracic aorta is normal in caliber. There is no false lumen. 2. There is no evidence of an acute pulmonary embolism. 3. There is stable mild enlargement of the cardiac chambers. There is no pleural or pericardial effusion. 4. There is no evidence of pneumonia or other acute pulmonary parenchymal abnormality. 5. On the lower most axial image the pancreatic head demonstrates low density. This merits further evaluation with a CT scan through the pancreas when the patient can tolerate the procedure. 6. There is a hypodense thyroid nodule on the right measuring only is 1.5 cm. 7. There is moderate degenerative disc change of the thoracic spine.   Electronically Signed   By: David  Martin[place]   On: 08/20/2013 17:29    Lab Results: Basic Metabolic Panel:  Recent Labs  08/20/13 1522  NA 139  K 4.4  CL 100  CO2 30  GLUCOSE 140*  BUN 11  CREATININE 0.77  CALCIUM 10.0   Liver Function Tests: No results found for this basename: AST, ALT, ALKPHOS, BILITOT, PROT, ALBUMIN,  in the last 72 hours   CBC:  Recent Labs  08/20/13 1522  WBC 7.9  NEUTROABS 5.6  HGB 13.2  HCT 40.1  MCV 83.5  PLT  228    No results found for this or any previous visit (from the past 240 hour(s)).   Hospital Course: This is a 78 year old who came to the emergency room with chest pain it was fairly atypical. She had some in her left shoulder. She says she thinks that she over did it and this caused her have the chest pain. She does however have a known aortic aneurysm and this was evaluated with CT of the chest and was unchanged. She ruled out for myocardial infarction. The visible part of her abdomen and arms chest CT showed that there was some question of a pancreatic lesion so she had a dedicated pancreatic CT which was normal. By the time of discharge was back to baseline.  Discharge Exam: Blood pressure 118/63, pulse 64, temperature 97.8 F (36.6  C), temperature source Oral, resp. rate 16, height 5' (1.524 m), weight 82.5 kg (181 lb 14.1 oz), SpO2 94.00%. She is awake and alert. She is anxious. Her chest is clear.  Disposition: Home . She did not want home health services      Signed: Alonza Bogus   08/22/2013, 11:05 AM

## 2013-09-22 ENCOUNTER — Ambulatory Visit (INDEPENDENT_AMBULATORY_CARE_PROVIDER_SITE_OTHER): Payer: Medicare Other | Admitting: Urology

## 2013-09-22 DIAGNOSIS — N281 Cyst of kidney, acquired: Secondary | ICD-10-CM

## 2013-09-22 DIAGNOSIS — N3020 Other chronic cystitis without hematuria: Secondary | ICD-10-CM

## 2013-10-28 DIAGNOSIS — J449 Chronic obstructive pulmonary disease, unspecified: Secondary | ICD-10-CM | POA: Diagnosis not present

## 2013-10-28 DIAGNOSIS — E785 Hyperlipidemia, unspecified: Secondary | ICD-10-CM | POA: Diagnosis not present

## 2013-10-28 DIAGNOSIS — R079 Chest pain, unspecified: Secondary | ICD-10-CM | POA: Diagnosis not present

## 2013-10-28 DIAGNOSIS — I10 Essential (primary) hypertension: Secondary | ICD-10-CM | POA: Diagnosis not present

## 2013-10-29 DIAGNOSIS — I10 Essential (primary) hypertension: Secondary | ICD-10-CM | POA: Diagnosis not present

## 2013-10-29 DIAGNOSIS — E785 Hyperlipidemia, unspecified: Secondary | ICD-10-CM | POA: Diagnosis not present

## 2013-10-29 DIAGNOSIS — R079 Chest pain, unspecified: Secondary | ICD-10-CM | POA: Diagnosis not present

## 2013-10-29 DIAGNOSIS — F411 Generalized anxiety disorder: Secondary | ICD-10-CM | POA: Diagnosis not present

## 2013-10-29 DIAGNOSIS — J449 Chronic obstructive pulmonary disease, unspecified: Secondary | ICD-10-CM | POA: Diagnosis not present

## 2013-11-27 ENCOUNTER — Ambulatory Visit (INDEPENDENT_AMBULATORY_CARE_PROVIDER_SITE_OTHER): Payer: Medicare Other | Admitting: Cardiovascular Disease

## 2013-11-27 VITALS — BP 146/68 | HR 60 | Ht 62.0 in | Wt 177.0 lb

## 2013-11-27 DIAGNOSIS — R0789 Other chest pain: Secondary | ICD-10-CM

## 2013-11-27 DIAGNOSIS — R5383 Other fatigue: Secondary | ICD-10-CM | POA: Diagnosis not present

## 2013-11-27 DIAGNOSIS — I498 Other specified cardiac arrhythmias: Secondary | ICD-10-CM

## 2013-11-27 DIAGNOSIS — Z87898 Personal history of other specified conditions: Secondary | ICD-10-CM

## 2013-11-27 DIAGNOSIS — I10 Essential (primary) hypertension: Secondary | ICD-10-CM

## 2013-11-27 DIAGNOSIS — R001 Bradycardia, unspecified: Secondary | ICD-10-CM

## 2013-11-27 DIAGNOSIS — I428 Other cardiomyopathies: Secondary | ICD-10-CM | POA: Diagnosis not present

## 2013-11-27 DIAGNOSIS — I712 Thoracic aortic aneurysm, without rupture, unspecified: Secondary | ICD-10-CM

## 2013-11-27 DIAGNOSIS — R5381 Other malaise: Secondary | ICD-10-CM | POA: Diagnosis not present

## 2013-11-27 DIAGNOSIS — Z9289 Personal history of other medical treatment: Secondary | ICD-10-CM

## 2013-11-27 MED ORDER — LISINOPRIL 20 MG PO TABS: 20.0000 mg | ORAL_TABLET | Freq: Every day | ORAL | Status: DC

## 2013-11-27 MED ORDER — CARVEDILOL 6.25 MG PO TABS
6.2500 mg | ORAL_TABLET | Freq: Two times a day (BID) | ORAL | Status: DC
Start: 2013-11-27 — End: 2014-08-25

## 2013-11-27 NOTE — Patient Instructions (Signed)
Your physician recommends that you schedule a follow-up appointment in: 3 months    Your physician has recommended you make the following change in your medication:     DECREASE  Coreg to 6.25 mg twice a day   INCREASE Lisinopril 20 mg daily     Thank you for choosing [facility] !

## 2013-11-27 NOTE — Progress Notes (Signed)
Patient ID: [name], female   DOB: [birth year], 78 y.o.   MRN: [id number]      SUBJECTIVE: [name] is a 78 yr old woman with HTN, a LBBB, and a nonischemic cardiomyopathy with a previous EF of 40-45% in June 2010 (also mild LVH). She had a cath in 2005 which revealed normal coronary arteries. She also has a mildly dilated thoracic aorta as per old office records (August 2013, 42 mm).  A followup echocardiogram performed in July 2014 revealed moderately reduced left ventricle systolic function, EF 38-10%, grade 1 diastolic dysfunction, LV dilatation, and mild aortic and mitral regurgitation.  She was hospitalized for atypical chest pain in April.  She infrequently experiences chest pain. If she overdoes it when she is doing laundry, she has some dyspnea with exertion. She said her heart rate stays low and she feels tired. She checks her blood pressure and systolic readings have occasionally gone to 159 mmHg. She denies leg swelling.    Allergies  Allergen Reactions  . Alprazolam Nausea And Vomiting  . Codeine Nausea And Vomiting  . Percodan [Oxycodone-Aspirin] Nausea And Vomiting  . Valium Nausea And Vomiting    Current Outpatient Prescriptions  Medication Sig Dispense Refill  . aspirin EC 81 MG tablet Take 81 mg by mouth every morning.       . carvedilol (COREG) 12.5 MG tablet Take 1 tablet (12.5 mg total) by mouth 2 (two) times daily.  180 tablet  3  . clorazepate (TRANXENE) 7.5 MG tablet Take 7.5 mg by mouth daily as needed for anxiety. For nerves      . lisinopril (PRINIVIL,ZESTRIL) 10 MG tablet Take 1 tablet (10 mg total) by mouth daily.  90 tablet  3  . traMADol (ULTRAM) 50 MG tablet Take 50 mg by mouth daily as needed for moderate pain or severe pain. Maximum dose= 8 tablets per day       No current facility-administered medications for this visit.    Past Medical History  Diagnosis Date  . Aneurysm   . Liver mass   . Lung, cysts, congenital     left lung cyst  .  Kidney stone     embedded  . Hemorrhoids   . Anxiety   . Hypertension     Past Surgical History  Procedure Laterality Date  . Tonsillectomy and adenoidectomy    . Complete hysterectomy    . Benign breast tumors    . Cholecystectomy    . Colonoscopy      History   Social History  . Marital Status: Widowed    Spouse Name: N/A    Number of Children: N/A  . Years of Education: 9th   Occupational History  .     Social History Main Topics  . Smoking status: Former Research scientist (life sciences)  . Smokeless tobacco: Not on file  . Alcohol Use: No  . Drug Use: No  . Sexual Activity: No   Other Topics Concern  . Not on file   Social History Narrative  . No narrative on file     Filed Vitals:   11/27/13 0934  BP: 146/68  Pulse: 60  Height: 5\' 2"  (1.575 m)  Weight: 177 lb (80.287 kg)    PHYSICAL EXAM General: NAD  Neck: No JVD, no thyromegaly or thyroid nodule.  Lungs: Clear to auscultation bilaterally with normal respiratory effort.  CV: Nondisplaced PMI. Heart regular rhythm, occasional premature contractions, normal S1/S2, no S3/S4, no murmur. No peripheral edema. No carotid bruit.  Normal pedal pulses.  Abdomen: Soft, nontender, no hepatosplenomegaly, no distention.  Neurologic: Alert and oriented x 3.  Psych: Normal affect.  Extremities: No clubbing or cyanosis.    ECG: reviewed and available in electronic records.      ASSESSMENT AND PLAN: 1. Non-ischemic cardiomyopathy: She is compensated and euvolemic. Given her hypertension and fatigue, I will increase lisinopril to 20 mg daily and decrease carvedilol to 6.25 mg bid.  2. HTN: Uncontrolled. Will increase lisinopril to 20 mg daily and decrease carvedilol to 6.25 mg bid.  3. Thoracic aortic aneurysm: CT angiogram of the chest demonstrated "stable appearance of the mildly dilated aortic root which measures 4.4 cm. This tapers into the aortic arch, and the descending thoracic aorta is normal in caliber. There is no false  lumen.". Will repeat in 1 year. 4. Fatigue: This may be related to relative bradycardia. Resting HR 60 bpm today. I will decrease carvedilol to 6.25 mg bid.  5. Chest pain: Atypical for a cardiac etiology. Will aim to keep BP controlled.  Dispo: f/u 3 months.  Kate Sable, M.D., F.A.C.C.

## 2014-02-15 DIAGNOSIS — J449 Chronic obstructive pulmonary disease, unspecified: Secondary | ICD-10-CM | POA: Diagnosis not present

## 2014-02-15 DIAGNOSIS — Z23 Encounter for immunization: Secondary | ICD-10-CM | POA: Diagnosis not present

## 2014-02-15 DIAGNOSIS — M1990 Unspecified osteoarthritis, unspecified site: Secondary | ICD-10-CM | POA: Diagnosis not present

## 2014-02-15 DIAGNOSIS — F419 Anxiety disorder, unspecified: Secondary | ICD-10-CM | POA: Diagnosis not present

## 2014-02-15 DIAGNOSIS — I10 Essential (primary) hypertension: Secondary | ICD-10-CM | POA: Diagnosis not present

## 2014-02-25 ENCOUNTER — Ambulatory Visit (INDEPENDENT_AMBULATORY_CARE_PROVIDER_SITE_OTHER): Payer: Medicare Other | Admitting: Cardiovascular Disease

## 2014-02-25 ENCOUNTER — Encounter: Payer: Self-pay | Admitting: Cardiovascular Disease

## 2014-02-25 VITALS — BP 114/64 | HR 73 | Ht 62.0 in | Wt 181.0 lb

## 2014-02-25 DIAGNOSIS — I712 Thoracic aortic aneurysm, without rupture, unspecified: Secondary | ICD-10-CM

## 2014-02-25 DIAGNOSIS — I429 Cardiomyopathy, unspecified: Secondary | ICD-10-CM | POA: Diagnosis not present

## 2014-02-25 DIAGNOSIS — I10 Essential (primary) hypertension: Secondary | ICD-10-CM | POA: Diagnosis not present

## 2014-02-25 DIAGNOSIS — I428 Other cardiomyopathies: Secondary | ICD-10-CM

## 2014-02-25 NOTE — Progress Notes (Signed)
Patient ID: [name], female   DOB: 1935/09/25, 78 y.o.   MRN: [id number]      SUBJECTIVE: [name] is a 78 yr old woman with HTN, a LBBB, and a nonischemic cardiomyopathy with a previous EF of 40-45% in June 2010 (also mild LVH). She had a cath in 2005 which revealed normal coronary arteries. She also has a mildly dilated thoracic aorta as per old office records (August 2013, 42 mm).  A followup echocardiogram performed in July 2014 revealed moderately reduced left ventricle systolic function, EF 40-98%, grade 1 diastolic dysfunction, LV dilatation, and mild aortic and mitral regurgitation.  She is able to vacuum without difficulties, but gets short of breath when raking leaves. She denies leg swelling. Her PCP prescribed an inhaler and this has helped with her dyspnea.  Review of Systems: As per "subjective", otherwise negative.  Allergies  Allergen Reactions  . Alprazolam Nausea And Vomiting  . Codeine Nausea And Vomiting  . Percodan [Oxycodone-Aspirin] Nausea And Vomiting  . Valium Nausea And Vomiting    Current Outpatient Prescriptions  Medication Sig Dispense Refill  . aspirin EC 81 MG tablet Take 81 mg by mouth every morning.       . carvedilol (COREG) 6.25 MG tablet Take 1 tablet (6.25 mg total) by mouth 2 (two) times daily.  180 tablet  3  . clorazepate (TRANXENE) 7.5 MG tablet Take 7.5 mg by mouth daily as needed for anxiety. For nerves      . lisinopril (PRINIVIL,ZESTRIL) 20 MG tablet Take 1 tablet (20 mg total) by mouth daily.  90 tablet  3  . traMADol (ULTRAM) 50 MG tablet Take 50 mg by mouth daily as needed for moderate pain or severe pain. Maximum dose= 8 tablets per day       No current facility-administered medications for this visit.    Past Medical History  Diagnosis Date  . Aneurysm   . Liver mass   . Lung, cysts, congenital     left lung cyst  . Kidney stone     embedded  . Hemorrhoids   . Anxiety   . Hypertension     Past Surgical History    Procedure Laterality Date  . Tonsillectomy and adenoidectomy    . Complete hysterectomy    . Benign breast tumors    . Cholecystectomy    . Colonoscopy      History   Social History  . Marital Status: Widowed    Spouse Name: N/A    Number of Children: N/A  . Years of Education: 9th   Occupational History  .     Social History Main Topics  . Smoking status: Former Research scientist (life sciences)  . Smokeless tobacco: Not on file  . Alcohol Use: No  . Drug Use: No  . Sexual Activity: No   Other Topics Concern  . Not on file   Social History Narrative  . No narrative on file    BP 114/64 Pulse 73  Weight 181 lb (82.101 kg) Height 5\' 2"  (1.575 m)    PHYSICAL EXAM General: NAD HEENT: Normal. Neck: No JVD, no thyromegaly. Lungs: Clear to auscultation bilaterally with normal respiratory effort. CV: Nondisplaced PMI.  Regular rate and rhythm, normal S1/S2, no S3/S4, no murmur. No pretibial or periankle edema.  No carotid bruit.  Normal pedal pulses.  Abdomen: Soft, nontender, no hepatosplenomegaly, no distention.  Neurologic: Alert and oriented x 3.  Psych: Normal affect. Skin: Normal. Musculoskeletal: Normal range of motion, no gross  deformities. Extremities: No clubbing or cyanosis.   ECG: Most recent ECG reviewed.      ASSESSMENT AND PLAN: 1. Non-ischemic cardiomyopathy: She is compensated and euvolemic. Continue Coreg and ACEI. 2. Essential HTN: Controlled on lisinopril 20 mg daily and carvedilol 6.25 mg bid.  3. Thoracic aortic aneurysm: CT angiogram of the chest demonstrated "stable appearance of the mildly dilated aortic root which measures 4.4 cm. This tapers into the aortic arch, and the descending thoracic aorta is normal in caliber. There is no false lumen". Will repeat in 1 year.   Dispo: f/u 6 months.  Kate Sable, M.D., F.A.C.C.

## 2014-02-25 NOTE — Patient Instructions (Signed)
Your physician wants you to follow-up in: 6 months You will receive a reminder letter in the mail two months in advance. If you don't receive a letter, please call our office to schedule the follow-up appointment.     Your physician recommends that you continue on your current medications as directed. Please refer to the Current Medication list given to you today.      Thank you for choosing [facility] !        

## 2014-03-23 ENCOUNTER — Ambulatory Visit: Payer: Medicare Other | Admitting: Urology

## 2014-04-13 ENCOUNTER — Ambulatory Visit (INDEPENDENT_AMBULATORY_CARE_PROVIDER_SITE_OTHER): Payer: Medicare Other | Admitting: Urology

## 2014-04-13 DIAGNOSIS — N3281 Overactive bladder: Secondary | ICD-10-CM | POA: Diagnosis not present

## 2014-04-13 DIAGNOSIS — N3020 Other chronic cystitis without hematuria: Secondary | ICD-10-CM

## 2014-04-13 DIAGNOSIS — N281 Cyst of kidney, acquired: Secondary | ICD-10-CM

## 2014-05-11 ENCOUNTER — Ambulatory Visit (HOSPITAL_COMMUNITY)
Admission: RE | Admit: 2014-05-11 | Discharge: 2014-05-11 | Disposition: A | Discharge status: Home / Self Care | Payer: Medicare Other | Source: Ambulatory Visit | Attending: Pulmonary Disease | Admitting: Pulmonary Disease

## 2014-05-11 ENCOUNTER — Other Ambulatory Visit (HOSPITAL_COMMUNITY): Payer: Self-pay | Admitting: Pulmonary Disease

## 2014-05-11 DIAGNOSIS — M25561 Pain in right knee: Secondary | ICD-10-CM | POA: Diagnosis not present

## 2014-05-11 DIAGNOSIS — J449 Chronic obstructive pulmonary disease, unspecified: Secondary | ICD-10-CM | POA: Diagnosis not present

## 2014-05-11 DIAGNOSIS — M25552 Pain in left hip: Principal | ICD-10-CM

## 2014-05-11 DIAGNOSIS — M5000 Cervical disc disorder with myelopathy, unspecified cervical region: Secondary | ICD-10-CM | POA: Diagnosis not present

## 2014-05-11 DIAGNOSIS — M25551 Pain in right hip: Secondary | ICD-10-CM | POA: Diagnosis not present

## 2014-05-11 DIAGNOSIS — M25562 Pain in left knee: Secondary | ICD-10-CM | POA: Diagnosis not present

## 2014-05-11 DIAGNOSIS — M545 Low back pain: Secondary | ICD-10-CM | POA: Diagnosis not present

## 2014-05-11 DIAGNOSIS — I10 Essential (primary) hypertension: Secondary | ICD-10-CM | POA: Diagnosis not present

## 2014-05-11 DIAGNOSIS — M25559 Pain in unspecified hip: Secondary | ICD-10-CM | POA: Diagnosis not present

## 2014-05-11 DIAGNOSIS — M160 Bilateral primary osteoarthritis of hip: Secondary | ICD-10-CM | POA: Diagnosis not present

## 2014-05-11 DIAGNOSIS — M1711 Unilateral primary osteoarthritis, right knee: Secondary | ICD-10-CM | POA: Diagnosis not present

## 2014-05-11 DIAGNOSIS — M47817 Spondylosis without myelopathy or radiculopathy, lumbosacral region: Secondary | ICD-10-CM | POA: Diagnosis not present

## 2014-05-11 DIAGNOSIS — M1712 Unilateral primary osteoarthritis, left knee: Secondary | ICD-10-CM | POA: Diagnosis not present

## 2014-07-16 ENCOUNTER — Other Ambulatory Visit (INDEPENDENT_AMBULATORY_CARE_PROVIDER_SITE_OTHER): Payer: Self-pay | Admitting: *Deleted

## 2014-07-16 DIAGNOSIS — Z86010 Personal history of colonic polyps: Secondary | ICD-10-CM

## 2014-07-16 DIAGNOSIS — Z800 Family history of malignant neoplasm of digestive organs: Secondary | ICD-10-CM

## 2014-08-13 ENCOUNTER — Telehealth (INDEPENDENT_AMBULATORY_CARE_PROVIDER_SITE_OTHER): Payer: Self-pay | Admitting: *Deleted

## 2014-08-13 DIAGNOSIS — Z1211 Encounter for screening for malignant neoplasm of colon: Secondary | ICD-10-CM

## 2014-08-13 MED ORDER — PEG 3350-KCL-NA BICARB-NACL 420 G PO SOLR: 4000.0000 mL | Freq: Once | ORAL | Status: DC

## 2014-08-13 NOTE — Telephone Encounter (Signed)
Patient needs trilyte 

## 2014-08-23 ENCOUNTER — Telehealth (INDEPENDENT_AMBULATORY_CARE_PROVIDER_SITE_OTHER): Payer: Self-pay | Admitting: *Deleted

## 2014-08-23 NOTE — Telephone Encounter (Signed)
Referring MD/PCP: hawkins   Procedure: tcs under fluoro  Reason/Indication:  Hx polyps, fam hx colon ca  Has patient had this procedure before?  Yes, 2011 -- paper chart  If so, when, by whom and where?    Is there a family history of colon cancer?  Yes, brother & sister  Who?  What age when diagnosed?    Is patient diabetic?   no      Does patient have prosthetic heart valve?  no  Do you have a pacemaker?  no  Has patient ever had endocarditis? no  Has patient had joint replacement within last 12 months?  no  Does patient tend to be constipated or take laxatives? no  Is patient on Coumadin, Plavix and/or Aspirin? yes  Medications: see epic  Allergies: see epic  Medication Adjustment: asa 2 days  Procedure date & time: 09/22/14 at 830

## 2014-08-24 NOTE — Telephone Encounter (Signed)
agree

## 2014-08-25 ENCOUNTER — Encounter: Payer: Self-pay | Admitting: Cardiovascular Disease

## 2014-08-25 ENCOUNTER — Ambulatory Visit (INDEPENDENT_AMBULATORY_CARE_PROVIDER_SITE_OTHER): Payer: Medicare Other | Admitting: Cardiovascular Disease

## 2014-08-25 VITALS — BP 128/72 | HR 69 | Ht 62.0 in | Wt 186.0 lb

## 2014-08-25 DIAGNOSIS — I429 Cardiomyopathy, unspecified: Secondary | ICD-10-CM

## 2014-08-25 DIAGNOSIS — I712 Thoracic aortic aneurysm, without rupture, unspecified: Secondary | ICD-10-CM

## 2014-08-25 DIAGNOSIS — I10 Essential (primary) hypertension: Secondary | ICD-10-CM

## 2014-08-25 DIAGNOSIS — I428 Other cardiomyopathies: Secondary | ICD-10-CM

## 2014-08-25 NOTE — Patient Instructions (Signed)
Your physician wants you to follow-up in: 1 year You will receive a reminder letter in the mail two months in advance. If you don't receive a letter, please call our office to schedule the follow-up appointment.    Your physician recommends that you continue on your current medications as directed. Please refer to the Current Medication list given to you today.    Please schedule your Ct scan of your chest      Thank you for choosing [facility] !

## 2014-08-25 NOTE — Progress Notes (Signed)
Patient ID: [name], female   DOB: 19-Dec-1935, 79 y.o.   MRN: [id number]      SUBJECTIVE: [name] is a 79 yr old woman with HTN, a LBBB, and a nonischemic cardiomyopathy with a previous EF of 40-45% in June 2010 (also mild LVH). She had a cath in 2005 which revealed normal coronary arteries. She also has a mildly dilated thoracic aorta as per old office records (August 2013, 42 mm).  A followup echocardiogram performed in July 2014 revealed moderately reduced left ventricle systolic function, EF 41-96%, grade 1 diastolic dysfunction, LV dilatation, and mild aortic and mitral regurgitation.  She denies orthopnea, PND, and leg swelling. She has diffuse arthritic pains. She has had some stable shortness of breath.  ECG today demonstrates sinus rhythm with a LBBB and PVC's.  Review of Systems: As per "subjective", otherwise negative.  Allergies  Allergen Reactions  . Alprazolam Nausea And Vomiting  . Codeine Nausea And Vomiting  . Percodan [Oxycodone-Aspirin] Nausea And Vomiting  . Valium Nausea And Vomiting    Current Outpatient Prescriptions  Medication Sig Dispense Refill  . aspirin EC 81 MG tablet Take 81 mg by mouth every morning.     . carvedilol (COREG) 12.5 MG tablet Take 12.5 mg by mouth 2 (two) times daily with a meal.    . clorazepate (TRANXENE) 7.5 MG tablet Take 7.5 mg by mouth daily as needed for anxiety. For nerves    . lisinopril (PRINIVIL,ZESTRIL) 20 MG tablet Take 1 tablet (20 mg total) by mouth daily. 90 tablet 3  . polyethylene glycol-electrolytes (NULYTELY/GOLYTELY) 420 G solution Take 4,000 mLs by mouth once. 4000 mL 0  . PROAIR HFA 108 (90 BASE) MCG/ACT inhaler     . traMADol (ULTRAM) 50 MG tablet Take 50 mg by mouth daily as needed for moderate pain or severe pain. Maximum dose= 8 tablets per day    . Umeclidinium-Vilanterol (ANORO ELLIPTA) 62.5-25 MCG/INH AEPB Inhale into the lungs.     No current facility-administered medications for this visit.     Past Medical History  Diagnosis Date  . Aneurysm   . Liver mass   . Lung, cysts, congenital     left lung cyst  . Kidney stone     embedded  . Hemorrhoids   . Anxiety   . Hypertension     Past Surgical History  Procedure Laterality Date  . Tonsillectomy and adenoidectomy    . Complete hysterectomy    . Benign breast tumors    . Cholecystectomy    . Colonoscopy      History   Social History  . Marital Status: Widowed    Spouse Name: N/A  . Number of Children: N/A  . Years of Education: 9th   Occupational History  .     Social History Main Topics  . Smoking status: Former Smoker -- 0.25 packs/day    Types: Cigarettes    Start date: 05/14/1977    Quit date: 05/28/2008  . Smokeless tobacco: Never Used  . Alcohol Use: No  . Drug Use: No  . Sexual Activity: No   Other Topics Concern  . Not on file   Social History Narrative     Filed Vitals:   08/25/14 0942  BP: 128/72  Pulse: 69  Height: 5\' 2"  (1.575 m)  Weight: 186 lb (84.369 kg)  SpO2: 96%    PHYSICAL EXAM General: NAD HEENT: Normal. Neck: No JVD, no thyromegaly. Lungs: Clear to auscultation bilaterally with normal respiratory  effort. CV: Nondisplaced PMI.  Regular rate and rhythm, normal S1/split S2, no S3/S4, no murmur. No pretibial or periankle edema.  No carotid bruit.  Normal pedal pulses.  Abdomen: Soft, nontender, no distention.  Neurologic: Alert and oriented x 3.  Psych: Normal affect. Skin: Normal. Musculoskeletal: No gross deformities. Extremities: No clubbing or cyanosis.   ECG: Most recent ECG reviewed.      ASSESSMENT AND PLAN: 1. Non-ischemic cardiomyopathy: She is compensated and euvolemic. Continue Coreg and ACEI. 2. Essential HTN: Controlled on lisinopril 20 mg daily and carvedilol 12.5 mg bid.  3. Thoracic aortic aneurysm: CT angiogram on 08/20/13 of the chest demonstrated "stable appearance of the mildly dilated aortic root which measures 4.4 cm. This tapers into  the aortic arch, and the descending thoracic aorta is normal in caliber. There is no false lumen". Will repeat to assess for interval enlargement.   Dispo: f/u 1 year.  Kate Sable, M.D., F.A.C.C.

## 2014-08-26 NOTE — Addendum Note (Signed)
Addended by: Barbarann Ehlers A on: 08/26/2014 01:51 PM   Modules accepted: Orders

## 2014-08-27 ENCOUNTER — Ambulatory Visit (HOSPITAL_COMMUNITY)
Admission: RE | Admit: 2014-08-27 | Discharge: 2014-08-27 | Disposition: A | Discharge status: Home / Self Care | Payer: Medicare Other | Source: Ambulatory Visit | Attending: Cardiovascular Disease | Admitting: Cardiovascular Disease

## 2014-08-27 DIAGNOSIS — I712 Thoracic aortic aneurysm, without rupture, unspecified: Secondary | ICD-10-CM

## 2014-08-27 DIAGNOSIS — I2510 Atherosclerotic heart disease of native coronary artery without angina pectoris: Secondary | ICD-10-CM | POA: Diagnosis not present

## 2014-08-27 DIAGNOSIS — I10 Essential (primary) hypertension: Secondary | ICD-10-CM | POA: Insufficient documentation

## 2014-08-27 DIAGNOSIS — I429 Cardiomyopathy, unspecified: Secondary | ICD-10-CM | POA: Diagnosis not present

## 2014-08-27 LAB — POCT I-STAT CREATININE: Creatinine, Ser: 0.7 mg/dL (ref 0.50–1.10)

## 2014-08-27 MED ORDER — IOHEXOL 350 MG/ML SOLN
100.0000 mL | Freq: Once | INTRAVENOUS | Status: AC | PRN
  Administered 2014-08-27: 100 mL via INTRAVENOUS

## 2014-09-22 ENCOUNTER — Encounter (HOSPITAL_COMMUNITY)
Admission: RE | Disposition: A | Discharge status: Home / Self Care | Payer: Self-pay | Source: Ambulatory Visit | Attending: Internal Medicine

## 2014-09-22 ENCOUNTER — Encounter (HOSPITAL_COMMUNITY): Payer: Self-pay | Admitting: *Deleted

## 2014-09-22 ENCOUNTER — Ambulatory Visit (HOSPITAL_COMMUNITY)
Admission: RE | Admit: 2014-09-22 | Discharge: 2014-09-22 | Disposition: A | Discharge status: Home / Self Care | Payer: Medicare Other | Source: Ambulatory Visit | Attending: Internal Medicine | Admitting: Internal Medicine

## 2014-09-22 DIAGNOSIS — Q438 Other specified congenital malformations of intestine: Secondary | ICD-10-CM | POA: Diagnosis not present

## 2014-09-22 DIAGNOSIS — Z9049 Acquired absence of other specified parts of digestive tract: Secondary | ICD-10-CM | POA: Insufficient documentation

## 2014-09-22 DIAGNOSIS — K644 Residual hemorrhoidal skin tags: Secondary | ICD-10-CM | POA: Diagnosis not present

## 2014-09-22 DIAGNOSIS — K5730 Diverticulosis of large intestine without perforation or abscess without bleeding: Secondary | ICD-10-CM | POA: Diagnosis not present

## 2014-09-22 DIAGNOSIS — Z09 Encounter for follow-up examination after completed treatment for conditions other than malignant neoplasm: Secondary | ICD-10-CM | POA: Diagnosis present

## 2014-09-22 DIAGNOSIS — F419 Anxiety disorder, unspecified: Secondary | ICD-10-CM | POA: Diagnosis not present

## 2014-09-22 DIAGNOSIS — Z7982 Long term (current) use of aspirin: Secondary | ICD-10-CM | POA: Diagnosis not present

## 2014-09-22 DIAGNOSIS — Z800 Family history of malignant neoplasm of digestive organs: Secondary | ICD-10-CM | POA: Diagnosis not present

## 2014-09-22 DIAGNOSIS — Z79899 Other long term (current) drug therapy: Secondary | ICD-10-CM | POA: Insufficient documentation

## 2014-09-22 DIAGNOSIS — I10 Essential (primary) hypertension: Secondary | ICD-10-CM | POA: Insufficient documentation

## 2014-09-22 DIAGNOSIS — Z86010 Personal history of colonic polyps: Secondary | ICD-10-CM | POA: Insufficient documentation

## 2014-09-22 DIAGNOSIS — Z79891 Long term (current) use of opiate analgesic: Secondary | ICD-10-CM | POA: Diagnosis not present

## 2014-09-22 DIAGNOSIS — Z791 Long term (current) use of non-steroidal anti-inflammatories (NSAID): Secondary | ICD-10-CM | POA: Insufficient documentation

## 2014-09-22 DIAGNOSIS — K921 Melena: Secondary | ICD-10-CM | POA: Diagnosis present

## 2014-09-22 DIAGNOSIS — Z87891 Personal history of nicotine dependence: Secondary | ICD-10-CM | POA: Insufficient documentation

## 2014-09-22 DIAGNOSIS — K635 Polyp of colon: Secondary | ICD-10-CM

## 2014-09-22 DIAGNOSIS — K648 Other hemorrhoids: Secondary | ICD-10-CM

## 2014-09-22 HISTORY — PX: COLONOSCOPY: SHX5424

## 2014-09-22 SURGERY — COLONOSCOPY
Anesthesia: Moderate Sedation

## 2014-09-22 MED ORDER — MIDAZOLAM HCL 5 MG/5ML IJ SOLN
INTRAMUSCULAR | Status: AC
  Filled 2014-09-22: qty 10

## 2014-09-22 MED ORDER — SODIUM CHLORIDE 0.9 % IV SOLN
INTRAVENOUS | Status: DC
  Administered 2014-09-22: 1000 mL via INTRAVENOUS

## 2014-09-22 MED ORDER — STERILE WATER FOR IRRIGATION IR SOLN
Status: DC | PRN
  Administered 2014-09-22: 5 mL
  Administered 2014-09-22: 08:00:00

## 2014-09-22 MED ORDER — MEPERIDINE HCL 50 MG/ML IJ SOLN
INTRAMUSCULAR | Status: AC
  Filled 2014-09-22: qty 1

## 2014-09-22 MED ORDER — MIDAZOLAM HCL 5 MG/5ML IJ SOLN
INTRAMUSCULAR | Status: DC | PRN
  Administered 2014-09-22 (×5): 1 mg via INTRAVENOUS
  Administered 2014-09-22: 2 mg via INTRAVENOUS
  Administered 2014-09-22 (×3): 1 mg via INTRAVENOUS

## 2014-09-22 MED ORDER — MEPERIDINE HCL 50 MG/ML IJ SOLN
INTRAMUSCULAR | Status: DC | PRN
  Administered 2014-09-22 (×2): 25 mg via INTRAVENOUS

## 2014-09-22 NOTE — H&P (Signed)
[name] is an 79 y.o. female.   Chief Complaint: Patient is here for colonoscopy. HPI: Patient is 79 year old Caucasian female was history of colonic polyps and family history of colon carcinoma. She is here for surveillance colonoscopy. She has intermittent hematochezia in the form of small amount of blood with her bowel movements. She has good appetite. She complains of abdominal pain which is mostly on the left side and not associated with meals or bowel movements. She states her sister had surgery for colon carcinoma about 2 years ago. She is she was 79 years old. She is having surgery tomorrow for recurrent disease. She has a nephew with colon carcinoma. Issues last colonoscopy was 3 years ago and was incomplete and followed by wedge colonoscopy. She is therefore undergoing colonoscopy under fluoroscopy hoping to complete exam.  Past Medical History  Diagnosis Date  . Aneurysm   . Liver mass   . Lung, cysts, congenital     left lung cyst  . Kidney stone     embedded  . Hemorrhoids   . Anxiety   . Hypertension     Past Surgical History  Procedure Laterality Date  . Tonsillectomy and adenoidectomy    . Complete hysterectomy    . Benign breast tumors    . Cholecystectomy    . Colonoscopy      Family History  Problem Relation Age of Onset  . Heart disease    . Arthritis    . Lung disease    . Cancer    . Asthma    . Diabetes    . Kidney disease     Social History:  reports that she quit smoking about 6 years ago. Her smoking use included Cigarettes. She started smoking about 37 years ago. She smoked 0.25 packs per day. She has never used smokeless tobacco. She reports that she does not drink alcohol or use illicit drugs.  Allergies:  Allergies  Allergen Reactions  . Alprazolam Nausea And Vomiting  . Codeine Nausea And Vomiting  . Percodan [Oxycodone-Aspirin] Nausea And Vomiting  . Valium Nausea And Vomiting    Medications Prior to Admission  Medication Sig  Dispense Refill  . aspirin EC 81 MG tablet Take 81 mg by mouth every morning.     . carvedilol (COREG) 12.5 MG tablet Take 12.5 mg by mouth 2 (two) times daily with a meal.    . clorazepate (TRANXENE) 7.5 MG tablet Take 7.5 mg by mouth daily as needed for anxiety. For nerves    . lisinopril (PRINIVIL,ZESTRIL) 20 MG tablet Take 1 tablet (20 mg total) by mouth daily. 90 tablet 3  . naproxen (NAPROSYN) 500 MG tablet Take 500 mg by mouth 2 (two) times daily with a meal.     . polyethylene glycol-electrolytes (NULYTELY/GOLYTELY) 420 G solution Take 4,000 mLs by mouth once. 4000 mL 0  . traMADol (ULTRAM) 50 MG tablet Take 50 mg by mouth daily as needed for moderate pain or severe pain. Maximum dose= 8 tablets per day    . Umeclidinium-Vilanterol (ANORO ELLIPTA) 62.5-25 MCG/INH AEPB Inhale into the lungs.    Marland Kitchen PROAIR HFA 108 (90 BASE) MCG/ACT inhaler       No results found for this or any previous visit (from the past 48 hour(s)). No results found.  ROS  Blood pressure 171/81, pulse 84, temperature 97.7 F (36.5 C), temperature source Oral, resp. rate 20, height 5\' 2"  (1.575 m), weight 185 lb (83.915 kg), SpO2 97 %. Physical Exam  Constitutional: She appears well-developed and well-nourished.  HENT:  Mouth/Throat: Oropharynx is clear and moist.  Eyes: Conjunctivae are normal.  Neck: No thyromegaly present.  Cardiovascular: Normal rate, regular rhythm and normal heart sounds.   No murmur heard. Respiratory: Effort normal and breath sounds normal.  GI:  She has birthmark and mid abdomen to the right of umbilicus abdomen is soft with generalized superficial tenderness. No organomegaly or masses  Musculoskeletal: She exhibits no edema.  Lymphadenopathy:    She has no cervical adenopathy.  Neurological: She is alert.  Skin: Skin is warm and dry.     Assessment/Plan History of colonic polyps. Family history of CRC. Colonoscopy under fluoroscopy.  REHMAN,NAJEEB U 09/22/2014, 8:45  AM

## 2014-09-22 NOTE — Op Note (Signed)
COLONOSCOPY PROCEDURE REPORT  PATIENT:  [name]  MR#:  [id number] Birthdate:  01/04/1936, 79 y.o., female Endoscopist:  Dr. Rogene Houston, MD Referred By:  Dr. Alonza Bogus, MD  Procedure Date: 09/22/2014  Procedure:   Colonoscopy  Indications:  Patient is 79 year old Caucasian female was history of colonic polyps and family history of colon carcinoma and sister and nephew who is here for surveillance colonoscopy. Her last exam in 2012 was incomplete and was followed by virtual colonoscopy. She is now undergoing colonoscopy under fluoroscopy hoping to do a complete exam. He has chronic abdominal pain which is felt to be wall pain and she has intermittent hematochezia felt to be secondary to hemorrhoids.  Informed Consent:  The procedure and risks were reviewed with the patient and informed consent was obtained.  Medications:  Demerol 50 mg IV Versed 10 mg IV  Description of procedure:  After a digital rectal exam was performed, that colonoscope was advanced from the anus through the rectum and colon to distal transverse colon confirmed on fluoroscopy. Patient had sharp angulation at sigmoid colon. Scope could never be straightened out withdrawn multiple times. Therefore examination incomplete. The mucosal surfaces of distal half of colon were carefully surveyed utilizing scope tip to flexion to facilitate fold flattening as needed. The scope was pulled down into the rectum where a thorough exam including retroflexion was performed.  Findings:   Prep excellent.  incomplete exam the distal transverse colon secondary to angulated loop and sigmoid colon which could never be reduced. Scattered diverticula at sigmoid colon. Normal rectal mucosa. Small hemorrhoids below the dentate line.   Therapeutic/Diagnostic Maneuvers Performed:   None  Complications:  None  Cecal Withdrawal Time:  N/A as cecum not reached.  Impression:  Incomplete exam to distal transverse colon secondary  to sharp angulation of sigmoid colon. Scattered diverticula at sigmoid colon. External hemorrhoids.   Recommendations:  Standard instructions given. Will schedule virtual colonoscopy in 4 weeks.  REHMAN,NAJEEB U  09/22/2014 9:58 AM  CC: Dr. Alonza Bogus, MD & Dr. Rayne Du ref. provider found

## 2014-09-22 NOTE — Discharge Instructions (Signed)
Resume usual medications and high fiber diet. No driving for 24 hours. Will schedule virtual colonoscopy in 4 weeks.      Colonoscopy, Care After These instructions give you information on caring for yourself after your procedure. Your doctor may also give you more specific instructions. Call your doctor if you have any problems or questions after your procedure. HOME CARE  Do not drive for 24 hours.  Do not sign important papers or use machinery for 24 hours.  You may shower.  You may go back to your usual activities, but go slower for the first 24 hours.  Take rest breaks often during the first 24 hours.  Walk around or use warm packs on your belly (abdomen) if you have belly cramping or gas.  Drink enough fluids to keep your pee (urine) clear or pale yellow.  Resume your normal diet. Avoid heavy or fried foods.  Avoid drinking alcohol for 24 hours or as told by your doctor.  Only take medicines as told by your doctor. If a tissue sample (biopsy) was taken during the procedure:   Do not take aspirin or blood thinners for 7 days, or as told by your doctor.  Do not drink alcohol for 7 days, or as told by your doctor.  Eat soft foods for the first 24 hours. GET HELP IF: You still have a small amount of blood in your poop (stool) 2-3 days after the procedure. GET HELP RIGHT AWAY IF:  You have more than a small amount of blood in your poop.  You see clumps of tissue (blood clots) in your poop.  Your belly is puffy (swollen).  You feel sick to your stomach (nauseous) or throw up (vomit).  You have a fever.  You have belly pain that gets worse and medicine does not help. MAKE SURE YOU:  Understand these instructions.  Will watch your condition.  Will get help right away if you are not doing well or get worse.   Diverticulosis Diverticulosis is the condition that develops when small pouches (diverticula) form in the wall of your colon. Your colon, or large  intestine, is where water is absorbed and stool is formed. The pouches form when the inside layer of your colon pushes through weak spots in the outer layers of your colon. CAUSES  No one knows exactly what causes diverticulosis. RISK FACTORS  Being older than 76. Your risk for this condition increases with age. Diverticulosis is rare in people younger than 40 years. By age 49, almost everyone has it.  Eating a low-fiber diet.  Being frequently constipated.  Being overweight.  Not getting enough exercise.  Smoking.  Taking over-the-counter pain medicines, like aspirin and ibuprofen. SYMPTOMS  Most people with diverticulosis do not have symptoms. DIAGNOSIS  Because diverticulosis often has no symptoms, health care providers often discover the condition during an exam for other colon problems. In many cases, a health care provider will diagnose diverticulosis while using a flexible scope to examine the colon (colonoscopy). TREATMENT  If you have never developed an infection related to diverticulosis, you may not need treatment. If you have had an infection before, treatment may include:  Eating more fruits, vegetables, and grains.  Taking a fiber supplement.  Taking a live bacteria supplement (probiotic).  Taking medicine to relax your colon. HOME CARE INSTRUCTIONS   Drink at least 6-8 glasses of water each day to prevent constipation.  Try not to strain when you have a bowel movement.  Keep all follow-up  appointments. If you have had an infection before:  Increase the fiber in your diet as directed by your health care provider or dietitian.  Take a dietary fiber supplement if your health care provider approves.  Only take medicines as directed by your health care provider. SEEK MEDICAL CARE IF:   You have abdominal pain.  You have bloating.  You have cramps.  You have not gone to the bathroom in 3 days. SEEK IMMEDIATE MEDICAL CARE IF:   Your pain gets  worse.  Yourbloating becomes very bad.  You have a fever or chills, and your symptoms suddenly get worse.  You begin vomiting.  You have bowel movements that are bloody or black. MAKE SURE YOU:  Understand these instructions.  Will watch your condition.  Will get help right away if you are not doing well or get worse.     High-Fiber Diet Fiber is found in fruits, vegetables, and grains. A high-fiber diet encourages the addition of more whole grains, legumes, fruits, and vegetables in your diet. The recommended amount of fiber for adult males is 38 g per day. For adult females, it is 25 g per day. Pregnant and lactating women should get 28 g of fiber per day. If you have a digestive or bowel problem, ask your caregiver for advice before adding high-fiber foods to your diet. Eat a variety of high-fiber foods instead of only a select few type of foods.  PURPOSE  To increase stool bulk.  To make bowel movements more regular to prevent constipation.  To lower cholesterol.  To prevent overeating. WHEN IS THIS DIET USED?  It may be used if you have constipation and hemorrhoids.  It may be used if you have uncomplicated diverticulosis (intestine condition) and irritable bowel syndrome.  It may be used if you need help with weight management.  It may be used if you want to add it to your diet as a protective measure against atherosclerosis, diabetes, and cancer. SOURCES OF FIBER  Whole-grain breads and cereals.  Fruits, such as apples, oranges, bananas, berries, prunes, and pears.  Vegetables, such as green peas, carrots, sweet potatoes, beets, broccoli, cabbage, spinach, and artichokes.  Legumes, such split peas, soy, lentils.  Almonds. FIBER CONTENT IN FOODS Starches and Grains / Dietary Fiber (g)  Cheerios, 1 cup / 3 g  Corn Flakes cereal, 1 cup / 0.7 g  Rice crispy treat cereal, 1 cup / 0.3 g  Instant oatmeal (cooked),  cup / 2 g  Frosted wheat cereal, 1  cup / 5.1 g  Brown, long-grain rice (cooked), 1 cup / 3.5 g  White, long-grain rice (cooked), 1 cup / 0.6 g  Enriched macaroni (cooked), 1 cup / 2.5 g Legumes / Dietary Fiber (g)  Baked beans (canned, plain, or vegetarian),  cup / 5.2 g  Kidney beans (canned),  cup / 6.8 g  Pinto beans (cooked),  cup / 5.5 g Breads and Crackers / Dietary Fiber (g)  Plain or honey graham crackers, 2 squares / 0.7 g  Saltine crackers, 3 squares / 0.3 g  Plain, salted pretzels, 10 pieces / 1.8 g  Whole-wheat bread, 1 slice / 1.9 g  White bread, 1 slice / 0.7 g  Raisin bread, 1 slice / 1.2 g  Plain bagel, 3 oz / 2 g  Flour tortilla, 1 oz / 0.9 g  Corn tortilla, 1 small / 1.5 g  Hamburger or hotdog bun, 1 small / 0.9 g Fruits / Dietary Fiber (g)  Apple with skin, 1 medium / 4.4 g  Sweetened applesauce,  cup / 1.5 g  Banana,  medium / 1.5 g  Grapes, 10 grapes / 0.4 g  Orange, 1 small / 2.3 g  Raisin, 1.5 oz / 1.6 g  Melon, 1 cup / 1.4 g Vegetables / Dietary Fiber (g)  Green beans (canned),  cup / 1.3 g  Carrots (cooked),  cup / 2.3 g  Broccoli (cooked),  cup / 2.8 g  Peas (cooked),  cup / 4.4 g  Mashed potatoes,  cup / 1.6 g  Lettuce, 1 cup / 0.5 g  Corn (canned),  cup / 1.6 g  Tomato,  cup / 1.1 g

## 2014-09-23 ENCOUNTER — Other Ambulatory Visit (INDEPENDENT_AMBULATORY_CARE_PROVIDER_SITE_OTHER): Payer: Self-pay | Admitting: Internal Medicine

## 2014-09-23 ENCOUNTER — Encounter (HOSPITAL_COMMUNITY): Payer: Self-pay | Admitting: Internal Medicine

## 2014-09-23 DIAGNOSIS — K562 Volvulus: Secondary | ICD-10-CM

## 2014-09-23 DIAGNOSIS — Z86010 Personal history of colonic polyps: Secondary | ICD-10-CM

## 2014-09-23 DIAGNOSIS — Z800 Family history of malignant neoplasm of digestive organs: Secondary | ICD-10-CM

## 2014-09-23 DIAGNOSIS — Q438 Other specified congenital malformations of intestine: Secondary | ICD-10-CM

## 2014-09-29 DIAGNOSIS — I712 Thoracic aortic aneurysm, without rupture: Secondary | ICD-10-CM | POA: Diagnosis not present

## 2014-09-29 DIAGNOSIS — J449 Chronic obstructive pulmonary disease, unspecified: Secondary | ICD-10-CM | POA: Diagnosis not present

## 2014-09-29 DIAGNOSIS — M5000 Cervical disc disorder with myelopathy, unspecified cervical region: Secondary | ICD-10-CM | POA: Diagnosis not present

## 2014-09-29 DIAGNOSIS — I10 Essential (primary) hypertension: Secondary | ICD-10-CM | POA: Diagnosis not present

## 2014-10-05 ENCOUNTER — Other Ambulatory Visit (HOSPITAL_COMMUNITY): Payer: Self-pay | Admitting: Orthopedic Surgery

## 2014-10-05 DIAGNOSIS — M19041 Primary osteoarthritis, right hand: Secondary | ICD-10-CM | POA: Diagnosis not present

## 2014-10-05 DIAGNOSIS — M19011 Primary osteoarthritis, right shoulder: Secondary | ICD-10-CM | POA: Diagnosis not present

## 2014-10-05 DIAGNOSIS — M25511 Pain in right shoulder: Secondary | ICD-10-CM

## 2014-10-06 ENCOUNTER — Ambulatory Visit (HOSPITAL_COMMUNITY)
Admission: RE | Admit: 2014-10-06 | Discharge: 2014-10-06 | Disposition: A | Discharge status: Home / Self Care | Payer: Medicare Other | Source: Ambulatory Visit | Attending: Orthopedic Surgery | Admitting: Orthopedic Surgery

## 2014-10-06 DIAGNOSIS — M25511 Pain in right shoulder: Secondary | ICD-10-CM | POA: Insufficient documentation

## 2014-10-06 DIAGNOSIS — M25411 Effusion, right shoulder: Secondary | ICD-10-CM | POA: Diagnosis not present

## 2014-10-19 DIAGNOSIS — M19011 Primary osteoarthritis, right shoulder: Secondary | ICD-10-CM | POA: Diagnosis not present

## 2014-10-27 ENCOUNTER — Ambulatory Visit
Admission: RE | Admit: 2014-10-27 | Discharge: 2014-10-27 | Disposition: A | Discharge status: Home / Self Care | Payer: Medicare Other | Source: Ambulatory Visit | Attending: Internal Medicine | Admitting: Internal Medicine

## 2014-10-27 DIAGNOSIS — Q438 Other specified congenital malformations of intestine: Secondary | ICD-10-CM

## 2014-10-27 DIAGNOSIS — K562 Volvulus: Secondary | ICD-10-CM

## 2014-10-27 DIAGNOSIS — Z860100 Personal history of colon polyps, unspecified: Secondary | ICD-10-CM

## 2014-10-27 DIAGNOSIS — Z800 Family history of malignant neoplasm of digestive organs: Secondary | ICD-10-CM

## 2014-10-27 DIAGNOSIS — K5730 Diverticulosis of large intestine without perforation or abscess without bleeding: Secondary | ICD-10-CM | POA: Diagnosis not present

## 2014-11-02 ENCOUNTER — Telehealth (INDEPENDENT_AMBULATORY_CARE_PROVIDER_SITE_OTHER): Payer: Self-pay | Admitting: *Deleted

## 2014-11-02 NOTE — Telephone Encounter (Signed)
Keyari got the message Dr. Laural Golden left on her answering machine. She was told he found a spot on her liver but it wasn't anything to worry about. Her question is, is it the same thing that has been there or something new? Her return phone number is [phone].

## 2014-11-02 NOTE — Telephone Encounter (Signed)
Dr.Rehman  Please advise .

## 2014-11-07 NOTE — Telephone Encounter (Signed)
Results reviewed with patient. Virtual colonoscopy negative for polyps. Hepatic hemangioma also noted on prior studies and is stable Report to PCP. Office visit in 5 years

## 2014-11-08 NOTE — Telephone Encounter (Signed)
5 yr OV noted in recall, virtual TCS report to PCP

## 2014-11-23 DIAGNOSIS — M65321 Trigger finger, right index finger: Secondary | ICD-10-CM | POA: Diagnosis not present

## 2014-12-09 ENCOUNTER — Other Ambulatory Visit: Payer: Self-pay | Admitting: Cardiovascular Disease

## 2014-12-14 DIAGNOSIS — M65321 Trigger finger, right index finger: Secondary | ICD-10-CM | POA: Diagnosis not present

## 2015-01-26 ENCOUNTER — Other Ambulatory Visit: Payer: Self-pay | Admitting: Cardiovascular Disease

## 2015-01-26 DIAGNOSIS — M1990 Unspecified osteoarthritis, unspecified site: Secondary | ICD-10-CM | POA: Diagnosis not present

## 2015-01-26 DIAGNOSIS — M5000 Cervical disc disorder with myelopathy, unspecified cervical region: Secondary | ICD-10-CM | POA: Diagnosis not present

## 2015-01-26 DIAGNOSIS — J449 Chronic obstructive pulmonary disease, unspecified: Secondary | ICD-10-CM | POA: Diagnosis not present

## 2015-01-26 DIAGNOSIS — I10 Essential (primary) hypertension: Secondary | ICD-10-CM | POA: Diagnosis not present

## 2015-01-26 DIAGNOSIS — Z23 Encounter for immunization: Secondary | ICD-10-CM | POA: Diagnosis not present

## 2015-01-27 ENCOUNTER — Emergency Department (HOSPITAL_COMMUNITY): Payer: Medicare Other

## 2015-01-27 ENCOUNTER — Observation Stay (HOSPITAL_COMMUNITY)
Admission: EM | Admit: 2015-01-27 | Discharge: 2015-01-28 | Disposition: A | Discharge status: Home / Self Care | Payer: Medicare Other | Attending: Pulmonary Disease | Admitting: Pulmonary Disease

## 2015-01-27 ENCOUNTER — Encounter (HOSPITAL_COMMUNITY): Payer: Self-pay | Admitting: Emergency Medicine

## 2015-01-27 DIAGNOSIS — Z79899 Other long term (current) drug therapy: Secondary | ICD-10-CM | POA: Diagnosis not present

## 2015-01-27 DIAGNOSIS — I729 Aneurysm of unspecified site: Secondary | ICD-10-CM | POA: Insufficient documentation

## 2015-01-27 DIAGNOSIS — N200 Calculus of kidney: Secondary | ICD-10-CM | POA: Diagnosis not present

## 2015-01-27 DIAGNOSIS — Z87891 Personal history of nicotine dependence: Secondary | ICD-10-CM | POA: Insufficient documentation

## 2015-01-27 DIAGNOSIS — F419 Anxiety disorder, unspecified: Secondary | ICD-10-CM | POA: Diagnosis not present

## 2015-01-27 DIAGNOSIS — Z7982 Long term (current) use of aspirin: Secondary | ICD-10-CM | POA: Diagnosis not present

## 2015-01-27 DIAGNOSIS — I10 Essential (primary) hypertension: Secondary | ICD-10-CM | POA: Diagnosis not present

## 2015-01-27 DIAGNOSIS — K649 Unspecified hemorrhoids: Secondary | ICD-10-CM | POA: Insufficient documentation

## 2015-01-27 DIAGNOSIS — R079 Chest pain, unspecified: Secondary | ICD-10-CM

## 2015-01-27 DIAGNOSIS — I712 Thoracic aortic aneurysm, without rupture: Secondary | ICD-10-CM | POA: Diagnosis not present

## 2015-01-27 DIAGNOSIS — Q330 Congenital cystic lung: Secondary | ICD-10-CM | POA: Diagnosis not present

## 2015-01-27 DIAGNOSIS — G8929 Other chronic pain: Secondary | ICD-10-CM | POA: Diagnosis not present

## 2015-01-27 DIAGNOSIS — I5042 Chronic combined systolic (congestive) and diastolic (congestive) heart failure: Secondary | ICD-10-CM

## 2015-01-27 DIAGNOSIS — I7121 Aneurysm of the ascending aorta, without rupture: Secondary | ICD-10-CM

## 2015-01-27 DIAGNOSIS — M654 Radial styloid tenosynovitis [de Quervain]: Secondary | ICD-10-CM

## 2015-01-27 DIAGNOSIS — R0789 Other chest pain: Secondary | ICD-10-CM | POA: Diagnosis not present

## 2015-01-27 HISTORY — DX: Chest pain, unspecified: R07.9

## 2015-01-27 LAB — COMPREHENSIVE METABOLIC PANEL
ALT: 10 U/L — ABNORMAL LOW (ref 14–54)
AST: 16 U/L (ref 15–41)
Albumin: 3.8 g/dL (ref 3.5–5.0)
Alkaline Phosphatase: 54 U/L (ref 38–126)
Anion gap: 6 (ref 5–15)
BUN: 10 mg/dL (ref 6–20)
CO2: 26 mmol/L (ref 22–32)
Calcium: 9 mg/dL (ref 8.9–10.3)
Chloride: 106 mmol/L (ref 101–111)
Creatinine, Ser: 0.63 mg/dL (ref 0.44–1.00)
GFR calc Af Amer: >60 mL/min (ref >60)
GFR calc non Af Amer: >60 mL/min (ref >60)
Glucose, Bld: 145 mg/dL — ABNORMAL HIGH (ref 65–99)
Potassium: 3.5 mmol/L (ref 3.5–5.1)
Sodium: 138 mmol/L (ref 135–145)
Total Bilirubin: 0.5 mg/dL (ref 0.3–1.2)
Total Protein: 6.4 g/dL — ABNORMAL LOW (ref 6.5–8.1)

## 2015-01-27 LAB — CBC
HCT: 40.1 % (ref 36.0–46.0)
Hemoglobin: 13.5 g/dL (ref 12.0–15.0)
MCH: 28.1 pg (ref 26.0–34.0)
MCHC: 33.7 g/dL (ref 30.0–36.0)
MCV: 83.4 fL (ref 78.0–100.0)
Platelets: 264 10*3/uL (ref 150–400)
RBC: 4.81 MIL/uL (ref 3.87–5.11)
RDW: 13.7 % (ref 11.5–15.5)
WBC: 6.4 10*3/uL (ref 4.0–10.5)

## 2015-01-27 LAB — PROTIME-INR
INR: 1.03 (ref 0.00–1.49)
Prothrombin Time: 13.8 seconds (ref 11.6–15.2)

## 2015-01-27 LAB — TROPONIN I: Troponin I: <0.03 ng/mL (ref <0.031)

## 2015-01-27 LAB — APTT: aPTT: 28 seconds (ref 24–37)

## 2015-01-27 MED ORDER — CARVEDILOL 12.5 MG PO TABS
12.5000 mg | ORAL_TABLET | Freq: Two times a day (BID) | ORAL | Status: DC
  Administered 2015-01-28: 12.5 mg via ORAL
  Filled 2015-01-27: qty 1

## 2015-01-27 MED ORDER — ASPIRIN 81 MG PO CHEW: 324.0000 mg | CHEWABLE_TABLET | Freq: Once | ORAL | Status: DC

## 2015-01-27 MED ORDER — UMECLIDINIUM-VILANTEROL 62.5-25 MCG/INH IN AEPB
1.0000 | INHALATION_SPRAY | Freq: Every day | RESPIRATORY_TRACT | Status: DC
  Filled 2015-01-27: qty 1

## 2015-01-27 MED ORDER — ONDANSETRON HCL 4 MG/2ML IJ SOLN: 4.0000 mg | Freq: Four times a day (QID) | INTRAMUSCULAR | Status: DC | PRN

## 2015-01-27 MED ORDER — SODIUM CHLORIDE 0.9 % IJ SOLN: 3.0000 mL | Freq: Two times a day (BID) | INTRAMUSCULAR | Status: DC

## 2015-01-27 MED ORDER — ALBUTEROL SULFATE (2.5 MG/3ML) 0.083% IN NEBU: 3.0000 mL | INHALATION_SOLUTION | Freq: Four times a day (QID) | RESPIRATORY_TRACT | Status: DC | PRN

## 2015-01-27 MED ORDER — SODIUM CHLORIDE 0.9 % IJ SOLN
3.0000 mL | Freq: Two times a day (BID) | INTRAMUSCULAR | Status: DC
  Administered 2015-01-27 – 2015-01-28 (×2): 3 mL via INTRAVENOUS

## 2015-01-27 MED ORDER — MORPHINE SULFATE (PF) 2 MG/ML IV SOLN: 2.0000 mg | INTRAVENOUS | Status: DC | PRN

## 2015-01-27 MED ORDER — SODIUM CHLORIDE 0.9 % IV SOLN: 250.0000 mL | INTRAVENOUS | Status: DC | PRN

## 2015-01-27 MED ORDER — ONDANSETRON HCL 4 MG/2ML IJ SOLN: 4.0000 mg | Freq: Three times a day (TID) | INTRAMUSCULAR | Status: DC | PRN

## 2015-01-27 MED ORDER — SODIUM CHLORIDE 0.9 % IJ SOLN: 3.0000 mL | INTRAMUSCULAR | Status: DC | PRN

## 2015-01-27 MED ORDER — LISINOPRIL 10 MG PO TABS
20.0000 mg | ORAL_TABLET | Freq: Every day | ORAL | Status: DC
  Administered 2015-01-28: 20 mg via ORAL
  Filled 2015-01-27: qty 2

## 2015-01-27 MED ORDER — ALBUTEROL SULFATE HFA 108 (90 BASE) MCG/ACT IN AERS
2.0000 | INHALATION_SPRAY | Freq: Four times a day (QID) | RESPIRATORY_TRACT | Status: DC | PRN
  Filled 2015-01-27: qty 6.7

## 2015-01-27 MED ORDER — GI COCKTAIL ~~LOC~~: 30.0000 mL | Freq: Four times a day (QID) | ORAL | Status: DC | PRN

## 2015-01-27 MED ORDER — NITROGLYCERIN 0.4 MG SL SUBL
0.4000 mg | SUBLINGUAL_TABLET | SUBLINGUAL | Status: DC | PRN
  Administered 2015-01-27 (×2): 0.4 mg via SUBLINGUAL
  Filled 2015-01-27: qty 1

## 2015-01-27 MED ORDER — ONDANSETRON HCL 4 MG PO TABS: 4.0000 mg | ORAL_TABLET | Freq: Four times a day (QID) | ORAL | Status: DC | PRN

## 2015-01-27 MED ORDER — TRAMADOL HCL 50 MG PO TABS: 50.0000 mg | ORAL_TABLET | Freq: Every day | ORAL | Status: DC | PRN

## 2015-01-27 MED ORDER — HEPARIN SODIUM (PORCINE) 5000 UNIT/ML IJ SOLN
5000.0000 [IU] | Freq: Three times a day (TID) | INTRAMUSCULAR | Status: DC
  Administered 2015-01-27 – 2015-01-28 (×2): 5000 [IU] via SUBCUTANEOUS
  Filled 2015-01-27 (×2): qty 1

## 2015-01-27 MED ORDER — CLORAZEPATE DIPOTASSIUM 7.5 MG PO TABS: 7.5000 mg | ORAL_TABLET | Freq: Every day | ORAL | Status: DC | PRN

## 2015-01-27 MED ORDER — ACETAMINOPHEN 325 MG PO TABS: 650.0000 mg | ORAL_TABLET | ORAL | Status: DC | PRN

## 2015-01-27 NOTE — ED Provider Notes (Signed)
CSN: [id number]     Arrival date & time 01/27/15  1818 History   First MD Initiated Contact with Patient 01/27/15 1830     Chief Complaint  Patient presents with  . Chest Pain     (Consider location/radiation/quality/duration/timing/severity/associated sxs/prior Treatment) HPI  Patient is a 79 year old female, she presents with chest pain. She states it is sharp, started about 3 hours ago. She has a known left bundle branch block. The patient was initially very hypertensive, this then improved significantly, she took 4 baby aspirin at home prior to arrival. She does state that she was short of breath with the chest pain but that has resolved somewhat.  She reports having abnormal feelings in the left side of her chest and the mid chest which is different from her prior chronic pain related to her left shoulder. She has been having some increase shortness of breath which is abnormal for her. She denies having any fevers chills or coughing.  She has a known 4.3 cm aneurysm of the ascending aorta which was last seen in April of this year, it had been unchanged since the prior scan.  Past Medical History  Diagnosis Date  . Aneurysm   . Liver mass   . Lung, cysts, congenital     left lung cyst  . Kidney stone     embedded  . Hemorrhoids   . Anxiety   . Hypertension    Past Surgical History  Procedure Laterality Date  . Tonsillectomy and adenoidectomy    . Complete hysterectomy    . Benign breast tumors    . Cholecystectomy    . Colonoscopy    . Colonoscopy N/A 09/22/2014    Procedure: COLONOSCOPY;  Surgeon: Rogene Houston, MD;  Location: [facility];  Service: Endoscopy;  Laterality: N/A;  830 -- to be done in OR under fluoro   Family History  Problem Relation Age of Onset  . Heart disease    . Arthritis    . Lung disease    . Cancer    . Asthma    . Diabetes    . Kidney disease     Social History  Substance Use Topics  . Smoking status: Former Smoker -- 0.25 packs/day     Types: Cigarettes    Start date: 05/14/1977    Quit date: 05/28/2008  . Smokeless tobacco: Never Used  . Alcohol Use: No   OB History    No data available     Review of Systems  All other systems reviewed and are negative.     Allergies  Alprazolam; Codeine; Percodan; and Valium  Home Medications   Prior to Admission medications   Medication Sig Start Date End Date Taking? Authorizing Provider  aspirin EC 81 MG tablet Take 81 mg by mouth every morning.    Yes Historical Provider, MD  carvedilol (COREG) 12.5 MG tablet Take 12.5 mg by mouth 2 (two) times daily with a meal.   Yes Historical Provider, MD  clorazepate (TRANXENE) 7.5 MG tablet Take 7.5 mg by mouth daily as needed for anxiety. For nerves   Yes Historical Provider, MD  lisinopril (PRINIVIL,ZESTRIL) 20 MG tablet TAKE ONE TABLET BY MOUTH DAILY. 01/26/15  Yes Herminio Commons, MD  naproxen (NAPROSYN) 500 MG tablet Take 500 mg by mouth 2 (two) times daily with a meal.  08/16/14  Yes Historical Provider, MD  Umeclidinium-Vilanterol (ANORO ELLIPTA) 62.5-25 MCG/INH AEPB Inhale 1 puff into the lungs daily.    Yes  Historical Provider, MD  carvedilol (COREG) 6.25 MG tablet TAKE ONE TABLET BY MOUTH 2 TIMES A DAY. Patient not taking: Reported on 01/27/2015 12/09/14   Herminio Commons, MD  methylPREDNISolone (MEDROL DOSEPAK) 4 MG TBPK tablet Take 4 mg by mouth as directed. 01/26/15   Historical Provider, MD  PROAIR HFA 108 (90 BASE) MCG/ACT inhaler Inhale 2 puffs into the lungs every 6 (six) hours as needed for wheezing or shortness of breath.  02/08/14   Historical Provider, MD  traMADol (ULTRAM) 50 MG tablet Take 50 mg by mouth daily as needed for moderate pain or severe pain. Maximum dose= 8 tablets per day    Historical Provider, MD   BP 112/69 mmHg  Pulse 101  Temp(Src) 98.2 F (36.8 C) (Oral)  Resp 14  Ht 5' (1.524 m)  Wt 174 lb (78.926 kg)  BMI 33.98 kg/m2  SpO2 95% Physical Exam  Constitutional: She appears  well-developed and well-nourished. No distress.  HENT:  Head: Normocephalic and atraumatic.  Mouth/Throat: Oropharynx is clear and moist. No oropharyngeal exudate.  Eyes: Conjunctivae and EOM are normal. Pupils are equal, round, and reactive to light. Right eye exhibits no discharge. Left eye exhibits no discharge. No scleral icterus.  Neck: Normal range of motion. Neck supple. No JVD present. No thyromegaly present.  Cardiovascular: Normal rate, regular rhythm, normal heart sounds and intact distal pulses.  Exam reveals no gallop and no friction rub.   No murmur heard. Pulmonary/Chest: Effort normal and breath sounds normal. No respiratory distress. She has no wheezes. She has no rales.  Abdominal: Soft. Bowel sounds are normal. She exhibits no distension and no mass. There is no tenderness.  Musculoskeletal: Normal range of motion. She exhibits no edema or tenderness.  Lymphadenopathy:    She has no cervical adenopathy.  Neurological: She is alert. Coordination normal.  Skin: Skin is warm and dry. No rash noted. No erythema.  Psychiatric: She has a normal mood and affect. Her behavior is normal.  Nursing note and vitals reviewed.  probably(  ED Course  Procedures (including critical care time) Labs Review Labs Reviewed  COMPREHENSIVE METABOLIC PANEL - Abnormal; Notable for the following:    Glucose, Bld 145 (*)    Total Protein 6.4 (*)    ALT 10 (*)    All other components within normal limits  APTT  CBC  PROTIME-INR  TROPONIN I    Imaging Review Dg Chest Portable 1 View  01/27/2015   CLINICAL DATA:  79 year old female with chest pain and shortness of breath  EXAM: PORTABLE CHEST - 1 VIEW  COMPARISON:  Chest CT and radiograph dated 08/20/2013.  FINDINGS: Single-view of the chest demonstrates emphysematous changes of the lungs. There is no focal consolidation, pleural effusion, or pneumothorax. Stable mild cardiomegaly. Osteopenia with left humeral arthroplasty. Partially  visualized cervical fusion plate and screw.  IMPRESSION: No focal consolidation or pneumothorax.   Electronically Signed   By: Anner Crete M.D.   On: 01/27/2015 19:03   I have personally reviewed and evaluated these images and lab results as part of my medical decision-making.   EKG Interpretation   Date/Time:  Thursday January 27 2015 18:21:45 EDT Ventricular Rate:  95 PR Interval:  162 QRS Duration: 172 QT Interval:  410 QTC Calculation: 515 R Axis:   50 Text Interpretation:  Sinus rhythm Left bundle branch block Since last  tracing rate faster Confirmed by Aditya Nastasi  MD, Anuj Summons ([id number]) on 01/27/2015  6:25:31 PM  MDM   Final diagnoses:  Chest pain, unspecified chest pain type    The patient has normal heart and lung sounds, no peripheral edema, her blood pressure is much improved, she is mildly tachycardic, she has no other risk factors for pulmonary embolism, there is no peripheral edema, she is requiring some oxygen as she is feeling dyspneic and feels better with the oxygen. We'll obtain basic labs, rule out pulmonary embolism, anticipate admission for further evaluation of possible coronary syndrome as she has not had any testing in the past but does state that she has congestive heart failure according to her cardiologist.  Last EF was 35-40 % one year ago  Discussed with the hospitalist who will come to admit the patient for chest pain. Troponin normal, EKG unchanged, symptoms are improving.    Noemi Chapel, MD 01/27/15 2121

## 2015-01-27 NOTE — ED Notes (Signed)
Per [facility] patient c/o sharp chest pain that started about 3 hours ago, EKG with showed LBBB, patient is unsure if this is new, BP per EMS 220/100, rechk was 154/92. Patient took 4 baby Aspirin at home. Patient states that she was short of breath with the chest pain but that has since went away.

## 2015-01-27 NOTE — ED Notes (Signed)
After second dose of Nitrostat patients blood pressure dropped from 161/76 to 102/78. Third dose of nitrostat not given. Patients only complaint at this time is headache. Patient denies dizziness. Patient was put on O2 2L via [place] for comfort.

## 2015-01-27 NOTE — H&P (Signed)
[facility] History and Physical  Samiha Denapoli Daughtridge BSJ:[id number] DOB: [birth year] DOA: 01/27/2015  Referring physician: Dr Sabra Heck - [facility] PCP: Alonza Bogus, MD   Chief Complaint: CP  HPI: Sabrinna Yearwood Reffner is a 79 y.o. female  CP. Started 6 hours ago. Sharp. Substernal. Associated with shortness of breath. Radiation to left upper chest. Denies radiation to the back, nausea, vomiting, diaphoresis. Patient to 4 aspirin at home with some improvement. Called EMS. He received nitroglycerin and oxygen in the ED with additional improvement. When pain initially came on patient was at rest and did not move cystic there are no aggravating factors and no alleviating factors. No change with deep breathing. Not associated with food. No recent change in medications. The methylprednisolone the patient was supposed to start 1 day ago she had not yet started. No lower extremity swelling   Review of Systems:  Constitutional:  No weight loss, night sweats, Fevers, chills, fatigue.  HEENT:  No headaches, Difficulty swallowing,Tooth/dental problems,Sore throat,  No sneezing, itching, ear ache, nasal congestion, post nasal drip,  Cardio-vascular: Per [facility]:  No heartburn, indigestion, abdominal pain, nausea, vomiting, diarrhea, change in bowel habits, loss of appetite  Resp:   No excess mucus, no productive cough, No non-productive cough, No coughing up of blood.No change in color of mucus.No wheezing.No chest wall deformity  Skin:  no rash or lesions.  GU:  no dysuria, change in color of urine, no urgency or frequency. No flank pain.  Musculoskeletal:   R wrist pain Psych:  No change in mood or affect. No depression or anxiety. No memory loss.   Past Medical History  Diagnosis Date  . Aneurysm   . Liver mass   . Lung, cysts, congenital     left lung cyst  . Kidney stone     embedded  . Hemorrhoids   . Anxiety   . Hypertension    Past Surgical History  Procedure Laterality Date  .  Tonsillectomy and adenoidectomy    . Complete hysterectomy    . Benign breast tumors    . Cholecystectomy    . Colonoscopy    . Colonoscopy N/A 09/22/2014    Procedure: COLONOSCOPY;  Surgeon: Rogene Houston, MD;  Location: [facility];  Service: Endoscopy;  Laterality: N/A;  830 -- to be done in OR under fluoro   Social History:  reports that she quit smoking about 6 years ago. Her smoking use included Cigarettes. She started smoking about 37 years ago. She smoked 0.25 packs per day. She has never used smokeless tobacco. She reports that she does not drink alcohol or use illicit drugs.  Allergies  Allergen Reactions  . Alprazolam Nausea And Vomiting  . Codeine Nausea And Vomiting  . Percodan [Oxycodone-Aspirin] Nausea And Vomiting  . Valium Nausea And Vomiting    Family History  Problem Relation Age of Onset  . Heart disease    . Arthritis    . Lung disease    . Cancer    . Asthma    . Diabetes    . Kidney disease       Prior to Admission medications   Medication Sig Start Date End Date Taking? Authorizing Provider  aspirin EC 81 MG tablet Take 81 mg by mouth every morning.    Yes Historical Provider, MD  carvedilol (COREG) 12.5 MG tablet Take 12.5 mg by mouth 2 (two) times daily with a meal.   Yes Historical Provider, MD  clorazepate (TRANXENE) 7.5 MG tablet  Take 7.5 mg by mouth daily as needed for anxiety. For nerves   Yes Historical Provider, MD  lisinopril (PRINIVIL,ZESTRIL) 20 MG tablet TAKE ONE TABLET BY MOUTH DAILY. 01/26/15  Yes Herminio Commons, MD  naproxen (NAPROSYN) 500 MG tablet Take 500 mg by mouth 2 (two) times daily with a meal.  08/16/14  Yes Historical Provider, MD  Umeclidinium-Vilanterol (ANORO ELLIPTA) 62.5-25 MCG/INH AEPB Inhale 1 puff into the lungs daily.    Yes Historical Provider, MD  carvedilol (COREG) 6.25 MG tablet TAKE ONE TABLET BY MOUTH 2 TIMES A DAY. Patient not taking: Reported on 01/27/2015 12/09/14   Herminio Commons, MD    methylPREDNISolone (MEDROL DOSEPAK) 4 MG TBPK tablet Take 4 mg by mouth as directed. 01/26/15   Historical Provider, MD  PROAIR HFA 108 (90 BASE) MCG/ACT inhaler Inhale 2 puffs into the lungs every 6 (six) hours as needed for wheezing or shortness of breath.  02/08/14   Historical Provider, MD  traMADol (ULTRAM) 50 MG tablet Take 50 mg by mouth daily as needed for moderate pain or severe pain. Maximum dose= 8 tablets per day    Historical Provider, MD   Physical Exam: Filed Vitals:   01/27/15 2030 01/27/15 2045 01/27/15 2100 01/27/15 2115  BP: 139/71  141/63   Pulse: 78 77 75 71  Temp:      TempSrc:      Resp: 18 23 28 21   Height:      Weight:      SpO2: 98% 98% 99% 97%    Wt Readings from Last 3 Encounters:  01/27/15 78.926 kg (174 lb)  09/22/14 83.915 kg (185 lb)  08/25/14 84.369 kg (186 lb)    General:  Appears calm and comfortable Eyes:  PERRL, normal lids, irises & conjunctiva ENT:  grossly normal hearing, lips & tongue Neck:  no LAD, masses or thyromegaly Cardiovascular:  RRR, no m/r/g. No LE edema. Respiratory:  CTA bilaterally, no w/r/r. Normal respiratory effort. Abdomen:  soft, ntnd Skin:  no rash or induration seen on limited exam Musculoskeletal: R wrist pain w/ palpation and mild swelling. nml ROM Psychiatric:  grossly normal mood and affect, speech fluent and appropriate Neurologic:  grossly non-focal.          Labs on Admission:  Basic Metabolic Panel:  Recent Labs Lab 01/27/15 1932  NA 138  K 3.5  CL 106  CO2 26  GLUCOSE 145*  BUN 10  CREATININE 0.63  CALCIUM 9.0   Liver Function Tests:  Recent Labs Lab 01/27/15 1932  AST 16  ALT 10*  ALKPHOS 54  BILITOT 0.5  PROT 6.4*  ALBUMIN 3.8   No results for input(s): LIPASE, AMYLASE in the last 168 hours. No results for input(s): AMMONIA in the last 168 hours. CBC:  Recent Labs Lab 01/27/15 1932  WBC 6.4  HGB 13.5  HCT 40.1  MCV 83.4  PLT 264   Cardiac Enzymes:  Recent Labs Lab  01/27/15 1932  TROPONINI <0.03    BNP (last 3 results) No results for input(s): BNP in the last 8760 hours.  ProBNP (last 3 results) No results for input(s): PROBNP in the last 8760 hours.  CBG: No results for input(s): GLUCAP in the last 168 hours.  Radiological Exams on Admission: Dg Chest Portable 1 View  01/27/2015   CLINICAL DATA:  79 year old female with chest pain and shortness of breath  EXAM: PORTABLE CHEST - 1 VIEW  COMPARISON:  Chest CT and radiograph dated 08/20/2013.  FINDINGS: Single-view of the chest demonstrates emphysematous changes of the lungs. There is no focal consolidation, pleural effusion, or pneumothorax. Stable mild cardiomegaly. Osteopenia with left humeral arthroplasty. Partially visualized cervical fusion plate and screw.  IMPRESSION: No focal consolidation or pneumothorax.   Electronically Signed   By: Anner Crete M.D.   On: 01/27/2015 19:03     Assessment/Plan Active Problems:   Tennis Must Quervain's disease (tenosynovitis)   Chest pain   Ascending aortic aneurysm   Chronic combined systolic and diastolic CHF (congestive heart failure)   Chest pain at rest   Essential hypertension   Anxiety   Chronic pain   CP r/o: Cardiac versus stress/anxiety versus GI versus pleuritic vs MSK. Troponin negative. EKG without evidence of ACS. Cardiac history includes a known left bundle branch block, nonischemic cardiopathy, 2015 Echo showing EF of 35-40% and grade 1 diastolic dysfunction. Last cardiac cath in 2005 showed normal coronary arteries, ascending aortic aneurysm to 4.3 cm. No evidence of Acute congestive failure, rupture or disection, PE. Cardiologist is Dr. Jacinta Shoe. CXR nml - Tele, obs - cycle troponins - GI cocktail - EKG in am - nitro, morphine, O2 prn  R wrist pain: h/o DeQuervain's tenosynovitis. Pt given Rx for medrol dose pack day prior to admission but did not start medication as she said that she had things to prior to starting and didn't want  to experience the side effects until at home. Wrist pain continues - start medrol dose pack as outpt  HTN: normotensive to hypertensive - continue coreg, lisinopril  Chronic pain:  - continue tramadol  Anxiety:  - continue clorazepate  Code Status: FULL DVT Prophylaxis: Hep Family Communication: Daughter Disposition Plan: pending improvemetn  MERRELL, DAVID J, MD Family Medicine [facility] [email or web address] Password [email or web address]

## 2015-01-28 DIAGNOSIS — R079 Chest pain, unspecified: Secondary | ICD-10-CM | POA: Diagnosis not present

## 2015-01-28 LAB — TROPONIN I
Troponin I: 0.03 ng/mL (ref <0.031)
Troponin I: 0.03 ng/mL (ref <0.031)

## 2015-01-28 NOTE — Progress Notes (Signed)
Subjective: She came in with chest pain but with further questioning it appears that she had a panic attack. She says she was very nervous and very short of breath and felt like she would "jump out of her skin"  Objective: Vital signs in last 24 hours: Temp:  [97.5 F (36.4 C)-98.5 F (36.9 C)] 98.1 F (36.7 C) (09/16 1000) Pulse Rate:  [70-107] 73 (09/16 1000) Resp:  [12-28] 20 (09/16 1000) BP: (96-173)/(55-89) 103/67 mmHg (09/16 1000) SpO2:  [95 %-100 %] 96 % (09/16 1000) Weight:  [78.926 kg (174 lb)-79.3 kg (174 lb 13.2 oz)] 79.3 kg (174 lb 13.2 oz) (09/15 2303) Weight change:  Last BM Date: 01/27/15  Intake/Output from previous day: 09/15 0701 - 09/16 0700 In: 340 [P.O.:240] Out: -   PHYSICAL EXAM General appearance: alert, cooperative and no distress Resp: clear to auscultation bilaterally Cardio: regular rate and rhythm, S1, S2 normal, no murmur, click, rub or gallop GI: soft, non-tender; bowel sounds normal; no masses,  no organomegaly Extremities: extremities normal, atraumatic, no cyanosis or edema  Lab Results:  Results for orders placed or performed during the hospital encounter of 01/27/15 (from the past 48 hour(s))  APTT     Status: None   Collection Time: 01/27/15  7:32 PM  Result Value Ref Range   aPTT 28 24 - 37 seconds  CBC     Status: None   Collection Time: 01/27/15  7:32 PM  Result Value Ref Range   WBC 6.4 4.0 - 10.5 K/uL   RBC 4.81 3.87 - 5.11 MIL/uL   Hemoglobin 13.5 12.0 - 15.0 g/dL   HCT 40.1 36.0 - 46.0 %   MCV 83.4 78.0 - 100.0 fL   MCH 28.1 26.0 - 34.0 pg   MCHC 33.7 30.0 - 36.0 g/dL   RDW 13.7 11.5 - 15.5 %   Platelets 264 150 - 400 K/uL  Comprehensive metabolic panel     Status: Abnormal   Collection Time: 01/27/15  7:32 PM  Result Value Ref Range   Sodium 138 135 - 145 mmol/L   Potassium 3.5 3.5 - 5.1 mmol/L   Chloride 106 101 - 111 mmol/L   CO2 26 22 - 32 mmol/L   Glucose, Bld 145 (H) 65 - 99 mg/dL   BUN 10 6 - 20 mg/dL   Creatinine, Ser 0.63 0.44 - 1.00 mg/dL   Calcium 9.0 8.9 - 10.3 mg/dL   Total Protein 6.4 (L) 6.5 - 8.1 g/dL   Albumin 3.8 3.5 - 5.0 g/dL   AST 16 15 - 41 U/L   ALT 10 (L) 14 - 54 U/L   Alkaline Phosphatase 54 38 - 126 U/L   Total Bilirubin 0.5 0.3 - 1.2 mg/dL   GFR calc non Af Amer >60 >60 mL/min   GFR calc Af Amer >60 >60 mL/min    Comment: (NOTE) The eGFR has been calculated using the CKD EPI equation. This calculation has not been validated in all clinical situations. eGFR's persistently <60 mL/min signify possible Chronic Kidney Disease.    Anion gap 6 5 - 15  Protime-INR     Status: None   Collection Time: 01/27/15  7:32 PM  Result Value Ref Range   Prothrombin Time 13.8 11.6 - 15.2 seconds   INR 1.03 0.00 - 1.49  Troponin I     Status: None   Collection Time: 01/27/15  7:32 PM  Result Value Ref Range   Troponin I <0.03 <0.031 ng/mL    Comment:  NO INDICATION OF MYOCARDIAL INJURY.   Troponin I-serum (0, 3, 6 hours)     Status: None   Collection Time: 01/27/15 11:49 PM  Result Value Ref Range   Troponin I 0.03 <0.031 ng/mL    Comment:        NO INDICATION OF MYOCARDIAL INJURY.   Troponin I-serum (0, 3, 6 hours)     Status: None   Collection Time: 01/28/15  2:50 AM  Result Value Ref Range   Troponin I 0.03 <0.031 ng/mL    Comment:        NO INDICATION OF MYOCARDIAL INJURY.     ABGS No results for input(s): PHART, PO2ART, TCO2, HCO3 in the last 72 hours.  Invalid input(s): PCO2 CULTURES No results found for this or any previous visit (from the past 240 hour(s)). Studies/Results: Dg Chest Portable 1 View  01/27/2015   CLINICAL DATA:  79 year old female with chest pain and shortness of breath  EXAM: PORTABLE CHEST - 1 VIEW  COMPARISON:  Chest CT and radiograph dated 08/20/2013.  FINDINGS: Single-view of the chest demonstrates emphysematous changes of the lungs. There is no focal consolidation, pleural effusion, or pneumothorax. Stable mild cardiomegaly.  Osteopenia with left humeral arthroplasty. Partially visualized cervical fusion plate and screw.  IMPRESSION: No focal consolidation or pneumothorax.   Electronically Signed   By: Anner Crete M.D.   On: 01/27/2015 19:03    Medications:  Prior to Admission:  Prescriptions prior to admission  Medication Sig Dispense Refill Last Dose  . aspirin EC 81 MG tablet Take 81 mg by mouth every morning.    01/27/2015 at Unknown time  . carvedilol (COREG) 12.5 MG tablet Take 12.5 mg by mouth 2 (two) times daily with a meal.   01/27/2015 at 800  . clorazepate (TRANXENE) 7.5 MG tablet Take 7.5 mg by mouth daily as needed for anxiety. For nerves   Past Week at Unknown time  . lisinopril (PRINIVIL,ZESTRIL) 20 MG tablet TAKE ONE TABLET BY MOUTH DAILY. 90 tablet 2 01/27/2015 at Unknown time  . naproxen (NAPROSYN) 500 MG tablet Take 500 mg by mouth 2 (two) times daily with a meal.    Past Week at Unknown time  . Umeclidinium-Vilanterol (ANORO ELLIPTA) 62.5-25 MCG/INH AEPB Inhale 1 puff into the lungs daily.    01/27/2015 at Unknown time  . carvedilol (COREG) 6.25 MG tablet TAKE ONE TABLET BY MOUTH 2 TIMES A DAY. (Patient not taking: Reported on 01/27/2015) 180 tablet 4   . methylPREDNISolone (MEDROL DOSEPAK) 4 MG TBPK tablet Take 4 mg by mouth as directed.     Marland Kitchen PROAIR HFA 108 (90 BASE) MCG/ACT inhaler Inhale 2 puffs into the lungs every 6 (six) hours as needed for wheezing or shortness of breath.    unknown  . traMADol (ULTRAM) 50 MG tablet Take 50 mg by mouth daily as needed for moderate pain or severe pain. Maximum dose= 8 tablets per day   unknown   Scheduled: . aspirin  324 mg Oral Once  . carvedilol  12.5 mg Oral BID WC  . heparin  5,000 Units Subcutaneous 3 times per day  . lisinopril  20 mg Oral Daily  . sodium chloride  3 mL Intravenous Q12H  . sodium chloride  3 mL Intravenous Q12H  . Umeclidinium-Vilanterol  1 puff Inhalation Daily   Continuous:  CBJ:SEGBTD chloride, acetaminophen, albuterol,  clorazepate, gi cocktail, morphine injection, nitroGLYCERIN, ondansetron (ZOFRAN) IV, ondansetron **OR** ondansetron (ZOFRAN) IV, sodium chloride, traMADol  Assesment: She had a  panic attack I think. She has ruled out for MI. I think she is ready for discharge Active Problems:   De Quervain's disease (tenosynovitis)   Chest pain   Ascending aortic aneurysm   Chronic combined systolic and diastolic CHF (congestive heart failure)   Chest pain at rest   Essential hypertension   Anxiety   Chronic pain    Plan: Discharge home      Tyris Eliot L 01/28/2015, 10:39 AM

## 2015-01-28 NOTE — Discharge Summary (Signed)
Physician Discharge Summary  Patient ID: [name] MRN: [id number] DOB/AGE: Dec 24, 1935 79 y.o. Primary Care Physician:Alletta Mattos L, MD Admit date: 01/27/2015 Discharge date: 01/28/2015    Discharge Diagnoses:  Panic attack Active Problems:   Tennis Must Quervain's disease (tenosynovitis)   Chest pain   Ascending aortic aneurysm   Chronic combined systolic and diastolic CHF (congestive heart failure)   Chest pain at rest   Essential hypertension   Anxiety   Chronic pain     Medication List    ASK your doctor about these medications        ANORO ELLIPTA 62.5-25 MCG/INH Aepb  Generic drug:  Umeclidinium-Vilanterol  Inhale 1 puff into the lungs daily.     aspirin EC 81 MG tablet  Take 81 mg by mouth every morning.     carvedilol 12.5 MG tablet  Commonly known as:  COREG  Take 12.5 mg by mouth 2 (two) times daily with a meal.     carvedilol 6.25 MG tablet  Commonly known as:  COREG  TAKE ONE TABLET BY MOUTH 2 TIMES A DAY.     clorazepate 7.5 MG tablet  Commonly known as:  TRANXENE  Take 7.5 mg by mouth daily as needed for anxiety. For nerves     lisinopril 20 MG tablet  Commonly known as:  PRINIVIL,ZESTRIL  TAKE ONE TABLET BY MOUTH DAILY.     methylPREDNISolone 4 MG Tbpk tablet  Commonly known as:  MEDROL DOSEPAK  Take 4 mg by mouth as directed.     naproxen 500 MG tablet  Commonly known as:  NAPROSYN  Take 500 mg by mouth 2 (two) times daily with a meal.     PROAIR HFA 108 (90 BASE) MCG/ACT inhaler  Generic drug:  albuterol  Inhale 2 puffs into the lungs every 6 (six) hours as needed for wheezing or shortness of breath.     traMADol 50 MG tablet  Commonly known as:  ULTRAM  Take 50 mg by mouth daily as needed for moderate pain or severe pain. Maximum dose= 8 tablets per day        Discharged Condition: Improved    Consults: None  Significant Diagnostic Studies: Dg Chest Portable 1 View  01/27/2015   CLINICAL DATA:  79 year old female with chest  pain and shortness of breath  EXAM: PORTABLE CHEST - 1 VIEW  COMPARISON:  Chest CT and radiograph dated 08/20/2013.  FINDINGS: Single-view of the chest demonstrates emphysematous changes of the lungs. There is no focal consolidation, pleural effusion, or pneumothorax. Stable mild cardiomegaly. Osteopenia with left humeral arthroplasty. Partially visualized cervical fusion plate and screw.  IMPRESSION: No focal consolidation or pneumothorax.   Electronically Signed   By: Anner Crete M.D.   On: 01/27/2015 19:03    Lab Results: Basic Metabolic Panel:  Recent Labs  01/27/15 1932  NA 138  K 3.5  CL 106  CO2 26  GLUCOSE 145*  BUN 10  CREATININE 0.63  CALCIUM 9.0   Liver Function Tests:  Recent Labs  01/27/15 1932  AST 16  ALT 10*  ALKPHOS 54  BILITOT 0.5  PROT 6.4*  ALBUMIN 3.8     CBC:  Recent Labs  01/27/15 1932  WBC 6.4  HGB 13.5  HCT 40.1  MCV 83.4  PLT 264    No results found for this or any previous visit (from the past 240 hour(s)).   Hospital Course: This is a 79 year old who was in her usual state of health when she  developed severe anxiety felt like she was short of breath and had atypical chest pain. She eventually came to the emergency department where she was evaluated and brought in for observation because of her symptoms. Her chest pain resolved. She was still very anxious. She ruled out for MI and by the next morning was back at her baseline  Discharge Exam: Blood pressure 103/67, pulse 73, temperature 98.1 F (36.7 C), temperature source Oral, resp. rate 20, height 5' (1.524 m), weight 79.3 kg (174 lb 13.2 oz), SpO2 96 %. She is awake and alert. She is anxious. Her chest is clear.  Disposition: Home she has medications for anxiety available      Signed: Westyn Driggers L   01/28/2015, 10:50 AM

## 2015-01-28 NOTE — Care Management Note (Signed)
Case Management Note  Patient Details  Name: [name] MRN: [id number] Date of Birth: 02-03-1936  Subjective/Objective:                  Pt admitted from home with CP. Pt lives with her daughter and will return home at discharge. Pt is independent with ADL's.  Action/Plan: Pt for discharge home today. No CM needs noted.  Expected Discharge Date:  01/28/15               Expected Discharge Plan:  Home/Self Care  In-House Referral:  NA  Discharge planning Services  CM Consult  Post Acute Care Choice:  NA Choice offered to:  NA  DME Arranged:    DME Agency:     HH Arranged:    HH Agency:     Status of Service:  Completed, signed off  Medicare Important Message Given:    Date Medicare IM Given:    Medicare IM give by:    Date Additional Medicare IM Given:    Additional Medicare Important Message give by:     If discussed at [place] of Stay Meetings, dates discussed:    Additional Comments:  Joylene Draft, RN 01/28/2015, 10:41 AM

## 2015-01-28 NOTE — Progress Notes (Signed)
Pt's IV catheter removed and intact. Pt's IV site clean dry and intact. Discharge instructions, medications and follow up appointments reviewed and discussed with patient. All questions were answered and no further questions at this time. Pt in stable condition and in no acute distress at time of discharge. Pt escorted by nurse tech.  

## 2015-02-04 NOTE — Patient Outreach (Signed)
[place] [facility]) Care Management  02/04/2015  [place] Filice Hernan 16-Jul-1935 [id number]   Referral from [employer] Tier 2 List, assigned Jon Billings, RN to outreach.  Thanks, Ronnell Freshwater. [place], [place] Assistant Phone: [phone] Fax: [phone]

## 2015-02-07 ENCOUNTER — Other Ambulatory Visit: Payer: Self-pay

## 2015-02-07 NOTE — Patient Outreach (Addendum)
[place] [facility]) Care Management  02/07/2015  [name] 08-26-1935 [id number]   First telephone call to patient regarding Tier 2 referral.  No answer.  HIPAA compliant message left.  Plan: RN Health Coach will attempt telephone outreach within 1-2 weeks.    Jone Baseman, RN, MSN [place] [phone]

## 2015-02-11 ENCOUNTER — Other Ambulatory Visit: Payer: Self-pay

## 2015-02-11 NOTE — Patient Outreach (Signed)
[place] [facility]) Care Management  02/11/2015  [name] 21-Mar-1936 [id number]   Second telephone call to patient regarding Tier 2 referral.  No answer.    Plan: RN Health Coach will attempt patient within 1-2 weeks.  Jone Baseman, RN, MSN [place] [phone]

## 2015-02-16 ENCOUNTER — Other Ambulatory Visit: Payer: Self-pay

## 2015-02-16 NOTE — Patient Outreach (Signed)
[place] [facility]) Care Management  02/16/2015  Keundra Petrucelli Sisemore [birth year] [id number]   Third telephone call to patient regarding Tier 2 referral.  No answer.  HIPAA compliant message left.    Plan: RN will send outreach letter to attempt contact.    Jone Baseman, RN, MSN [place] [phone]

## 2015-03-03 NOTE — Patient Outreach (Signed)
[place] [facility]) Care Management  03/03/2015  [name] 1935/12/08 [id number]   No response from patient after 3 outreach calls and letter.  Plan: RN Health Coach will forward patient information to Lurline Del for case closure.   RN Health Coach will send closure letter to PCP.    [name] Baseman, RN, MSN [place] [phone]

## 2015-03-10 NOTE — Patient Outreach (Signed)
[place] [facility]) Care Management  03/10/2015  [name] Dec 14, 1935 [id number]   Notification from Jon Billings, RN to close patient due to unable to contact for [name] Management services.  Thanks, Ronnell Freshwater. [place], [place] Assistant Phone: [phone] Fax: [phone]

## 2015-04-19 ENCOUNTER — Ambulatory Visit: Payer: Medicare Other | Admitting: Urology

## 2015-05-17 ENCOUNTER — Ambulatory Visit (INDEPENDENT_AMBULATORY_CARE_PROVIDER_SITE_OTHER): Payer: Medicare Other | Admitting: Urology

## 2015-05-17 DIAGNOSIS — N3020 Other chronic cystitis without hematuria: Secondary | ICD-10-CM | POA: Diagnosis not present

## 2015-05-17 DIAGNOSIS — N281 Cyst of kidney, acquired: Secondary | ICD-10-CM | POA: Diagnosis not present

## 2015-05-17 DIAGNOSIS — N3281 Overactive bladder: Secondary | ICD-10-CM

## 2015-05-19 DIAGNOSIS — H903 Sensorineural hearing loss, bilateral: Secondary | ICD-10-CM | POA: Diagnosis not present

## 2015-05-19 DIAGNOSIS — R42 Dizziness and giddiness: Secondary | ICD-10-CM | POA: Diagnosis not present

## 2015-05-30 ENCOUNTER — Other Ambulatory Visit (HOSPITAL_COMMUNITY): Payer: Self-pay | Admitting: Pulmonary Disease

## 2015-05-30 DIAGNOSIS — R42 Dizziness and giddiness: Secondary | ICD-10-CM | POA: Diagnosis not present

## 2015-05-30 DIAGNOSIS — J449 Chronic obstructive pulmonary disease, unspecified: Secondary | ICD-10-CM | POA: Diagnosis not present

## 2015-05-30 DIAGNOSIS — I10 Essential (primary) hypertension: Secondary | ICD-10-CM | POA: Diagnosis not present

## 2015-05-30 DIAGNOSIS — M1990 Unspecified osteoarthritis, unspecified site: Secondary | ICD-10-CM | POA: Diagnosis not present

## 2015-05-31 ENCOUNTER — Ambulatory Visit (HOSPITAL_COMMUNITY)
Admission: RE | Admit: 2015-05-31 | Discharge: 2015-05-31 | Disposition: A | Discharge status: Home / Self Care | Payer: Medicare Other | Source: Ambulatory Visit | Attending: Pulmonary Disease | Admitting: Pulmonary Disease

## 2015-05-31 DIAGNOSIS — D329 Benign neoplasm of meninges, unspecified: Secondary | ICD-10-CM | POA: Diagnosis not present

## 2015-05-31 DIAGNOSIS — R42 Dizziness and giddiness: Secondary | ICD-10-CM

## 2015-06-09 ENCOUNTER — Other Ambulatory Visit (HOSPITAL_COMMUNITY): Payer: Self-pay | Admitting: Pulmonary Disease

## 2015-06-09 DIAGNOSIS — R42 Dizziness and giddiness: Secondary | ICD-10-CM

## 2015-06-24 ENCOUNTER — Ambulatory Visit (HOSPITAL_COMMUNITY)
Admission: RE | Admit: 2015-06-24 | Discharge: 2015-06-24 | Disposition: A | Discharge status: Home / Self Care | Payer: Medicare Other | Source: Ambulatory Visit | Attending: Pulmonary Disease | Admitting: Pulmonary Disease

## 2015-06-24 DIAGNOSIS — R9089 Other abnormal findings on diagnostic imaging of central nervous system: Secondary | ICD-10-CM | POA: Insufficient documentation

## 2015-06-24 DIAGNOSIS — R9082 White matter disease, unspecified: Secondary | ICD-10-CM | POA: Diagnosis not present

## 2015-06-24 DIAGNOSIS — G935 Compression of brain: Secondary | ICD-10-CM | POA: Diagnosis not present

## 2015-06-24 DIAGNOSIS — Q283 Other malformations of cerebral vessels: Secondary | ICD-10-CM | POA: Insufficient documentation

## 2015-06-24 DIAGNOSIS — R42 Dizziness and giddiness: Secondary | ICD-10-CM | POA: Diagnosis not present

## 2015-06-24 DIAGNOSIS — D329 Benign neoplasm of meninges, unspecified: Secondary | ICD-10-CM | POA: Diagnosis not present

## 2015-06-24 LAB — POCT I-STAT CREATININE: Creatinine, Ser: 0.7 mg/dL (ref 0.44–1.00)

## 2015-06-24 MED ORDER — GADOBENATE DIMEGLUMINE 529 MG/ML IV SOLN
15.0000 mL | Freq: Once | INTRAVENOUS | Status: AC | PRN
  Administered 2015-06-24: 15 mL via INTRAVENOUS

## 2015-07-11 ENCOUNTER — Emergency Department (HOSPITAL_COMMUNITY): Payer: Medicare Other

## 2015-07-11 ENCOUNTER — Encounter (HOSPITAL_COMMUNITY): Payer: Self-pay | Admitting: *Deleted

## 2015-07-11 ENCOUNTER — Observation Stay (HOSPITAL_COMMUNITY)
Admission: EM | Admit: 2015-07-11 | Discharge: 2015-07-13 | Disposition: A | Discharge status: Home / Self Care | Payer: Medicare Other | Attending: Pulmonary Disease | Admitting: Pulmonary Disease

## 2015-07-11 DIAGNOSIS — I428 Other cardiomyopathies: Secondary | ICD-10-CM | POA: Diagnosis present

## 2015-07-11 DIAGNOSIS — I248 Other forms of acute ischemic heart disease: Secondary | ICD-10-CM | POA: Diagnosis present

## 2015-07-11 DIAGNOSIS — R079 Chest pain, unspecified: Secondary | ICD-10-CM | POA: Diagnosis not present

## 2015-07-11 DIAGNOSIS — J441 Chronic obstructive pulmonary disease with (acute) exacerbation: Principal | ICD-10-CM | POA: Diagnosis present

## 2015-07-11 DIAGNOSIS — R6889 Other general symptoms and signs: Secondary | ICD-10-CM | POA: Diagnosis not present

## 2015-07-11 DIAGNOSIS — I10 Essential (primary) hypertension: Secondary | ICD-10-CM | POA: Diagnosis present

## 2015-07-11 DIAGNOSIS — Z9049 Acquired absence of other specified parts of digestive tract: Secondary | ICD-10-CM | POA: Insufficient documentation

## 2015-07-11 DIAGNOSIS — J209 Acute bronchitis, unspecified: Secondary | ICD-10-CM | POA: Diagnosis present

## 2015-07-11 DIAGNOSIS — Z23 Encounter for immunization: Secondary | ICD-10-CM | POA: Insufficient documentation

## 2015-07-11 DIAGNOSIS — J9801 Acute bronchospasm: Secondary | ICD-10-CM | POA: Insufficient documentation

## 2015-07-11 DIAGNOSIS — Z87891 Personal history of nicotine dependence: Secondary | ICD-10-CM | POA: Insufficient documentation

## 2015-07-11 DIAGNOSIS — K219 Gastro-esophageal reflux disease without esophagitis: Secondary | ICD-10-CM | POA: Diagnosis present

## 2015-07-11 DIAGNOSIS — Z90710 Acquired absence of both cervix and uterus: Secondary | ICD-10-CM | POA: Insufficient documentation

## 2015-07-11 DIAGNOSIS — E43 Unspecified severe protein-calorie malnutrition: Secondary | ICD-10-CM | POA: Diagnosis present

## 2015-07-11 DIAGNOSIS — R197 Diarrhea, unspecified: Secondary | ICD-10-CM | POA: Insufficient documentation

## 2015-07-11 DIAGNOSIS — R7989 Other specified abnormal findings of blood chemistry: Secondary | ICD-10-CM | POA: Diagnosis not present

## 2015-07-11 DIAGNOSIS — R05 Cough: Secondary | ICD-10-CM | POA: Diagnosis not present

## 2015-07-11 DIAGNOSIS — I447 Left bundle-branch block, unspecified: Secondary | ICD-10-CM | POA: Diagnosis present

## 2015-07-11 DIAGNOSIS — R0789 Other chest pain: Secondary | ICD-10-CM | POA: Insufficient documentation

## 2015-07-11 DIAGNOSIS — R778 Other specified abnormalities of plasma proteins: Secondary | ICD-10-CM | POA: Diagnosis present

## 2015-07-11 DIAGNOSIS — I2489 Other forms of acute ischemic heart disease: Secondary | ICD-10-CM | POA: Diagnosis present

## 2015-07-11 HISTORY — DX: Other cardiomyopathies: I42.8

## 2015-07-11 HISTORY — DX: Thoracic aortic aneurysm, without rupture: I71.2

## 2015-07-11 HISTORY — DX: Calculus of kidney: N20.0

## 2015-07-11 HISTORY — DX: Aneurysm of the ascending aorta, without rupture: I71.21

## 2015-07-11 HISTORY — DX: Essential (primary) hypertension: I10

## 2015-07-11 HISTORY — DX: Chronic obstructive pulmonary disease, unspecified: J44.9

## 2015-07-11 LAB — COMPREHENSIVE METABOLIC PANEL
ALT: 37 U/L (ref 14–54)
AST: 35 U/L (ref 15–41)
Albumin: 4 g/dL (ref 3.5–5.0)
Alkaline Phosphatase: 62 U/L (ref 38–126)
Anion gap: 7 (ref 5–15)
BUN: 18 mg/dL (ref 6–20)
CO2: 33 mmol/L — ABNORMAL HIGH (ref 22–32)
Calcium: 9.4 mg/dL (ref 8.9–10.3)
Chloride: 103 mmol/L (ref 101–111)
Creatinine, Ser: 0.78 mg/dL (ref 0.44–1.00)
GFR calc Af Amer: >60 mL/min (ref >60)
GFR calc non Af Amer: >60 mL/min (ref >60)
Glucose, Bld: 110 mg/dL — ABNORMAL HIGH (ref 65–99)
Potassium: 4.2 mmol/L (ref 3.5–5.1)
Sodium: 143 mmol/L (ref 135–145)
Total Bilirubin: 0.5 mg/dL (ref 0.3–1.2)
Total Protein: 6.9 g/dL (ref 6.5–8.1)

## 2015-07-11 LAB — CBC
HCT: 42.3 % (ref 36.0–46.0)
Hemoglobin: 13.7 g/dL (ref 12.0–15.0)
MCH: 27.5 pg (ref 26.0–34.0)
MCHC: 32.4 g/dL (ref 30.0–36.0)
MCV: 84.9 fL (ref 78.0–100.0)
Platelets: 216 10*3/uL (ref 150–400)
RBC: 4.98 MIL/uL (ref 3.87–5.11)
RDW: 13.6 % (ref 11.5–15.5)
WBC: 5.6 10*3/uL (ref 4.0–10.5)

## 2015-07-11 LAB — MAGNESIUM: Magnesium: 2 mg/dL (ref 1.7–2.4)

## 2015-07-11 LAB — TROPONIN I
Troponin I: 0.12 ng/mL — ABNORMAL HIGH (ref <0.031)
Troponin I: 0.1 ng/mL — ABNORMAL HIGH (ref <0.031)
Troponin I: 0.07 ng/mL — ABNORMAL HIGH (ref <0.031)

## 2015-07-11 LAB — INFLUENZA PANEL BY PCR (TYPE A & B)
H1N1 flu by pcr: NOT DETECTED
Influenza A By PCR: NEGATIVE
Influenza B By PCR: NEGATIVE

## 2015-07-11 LAB — LIPASE, BLOOD: Lipase: 35 U/L (ref 11–51)

## 2015-07-11 LAB — PHOSPHORUS: Phosphorus: 2.8 mg/dL (ref 2.5–4.6)

## 2015-07-11 MED ORDER — CARVEDILOL 12.5 MG PO TABS
12.5000 mg | ORAL_TABLET | Freq: Two times a day (BID) | ORAL | Status: DC
  Administered 2015-07-11 – 2015-07-13 (×4): 12.5 mg via ORAL
  Filled 2015-07-11 (×4): qty 1

## 2015-07-11 MED ORDER — ENSURE ENLIVE PO LIQD
237.0000 mL | Freq: Two times a day (BID) | ORAL | Status: DC
  Administered 2015-07-12: 237 mL via ORAL

## 2015-07-11 MED ORDER — PNEUMOCOCCAL VAC POLYVALENT 25 MCG/0.5ML IJ INJ
0.5000 mL | INJECTION | INTRAMUSCULAR | Status: AC
  Administered 2015-07-12: 0.5 mL via INTRAMUSCULAR
  Filled 2015-07-11: qty 0.5

## 2015-07-11 MED ORDER — ACETAMINOPHEN 650 MG RE SUPP: 650.0000 mg | Freq: Four times a day (QID) | RECTAL | Status: DC | PRN

## 2015-07-11 MED ORDER — PREDNISONE 20 MG PO TABS
40.0000 mg | ORAL_TABLET | Freq: Every day | ORAL | Status: DC
  Administered 2015-07-12 – 2015-07-13 (×2): 40 mg via ORAL
  Filled 2015-07-11 (×2): qty 2

## 2015-07-11 MED ORDER — ASPIRIN EC 81 MG PO TBEC
81.0000 mg | DELAYED_RELEASE_TABLET | Freq: Every day | ORAL | Status: DC
  Administered 2015-07-12 – 2015-07-13 (×2): 81 mg via ORAL
  Filled 2015-07-11 (×2): qty 1

## 2015-07-11 MED ORDER — TRAMADOL HCL 50 MG PO TABS
50.0000 mg | ORAL_TABLET | Freq: Three times a day (TID) | ORAL | Status: DC | PRN
  Administered 2015-07-11: 50 mg via ORAL
  Filled 2015-07-11: qty 1

## 2015-07-11 MED ORDER — ACETAMINOPHEN 325 MG PO TABS: 650.0000 mg | ORAL_TABLET | Freq: Four times a day (QID) | ORAL | Status: DC | PRN

## 2015-07-11 MED ORDER — PANTOPRAZOLE SODIUM 40 MG PO TBEC
40.0000 mg | DELAYED_RELEASE_TABLET | Freq: Every day | ORAL | Status: DC
  Administered 2015-07-11 – 2015-07-12 (×2): 40 mg via ORAL
  Filled 2015-07-11 (×2): qty 1

## 2015-07-11 MED ORDER — ACETAMINOPHEN 325 MG PO TABS: 650.0000 mg | ORAL_TABLET | ORAL | Status: DC | PRN

## 2015-07-11 MED ORDER — BUDESONIDE 0.25 MG/2ML IN SUSP
0.2500 mg | Freq: Two times a day (BID) | RESPIRATORY_TRACT | Status: DC
  Administered 2015-07-11 – 2015-07-12 (×3): 0.25 mg via RESPIRATORY_TRACT
  Filled 2015-07-11 (×3): qty 2

## 2015-07-11 MED ORDER — ONDANSETRON HCL 4 MG PO TABS: 4.0000 mg | ORAL_TABLET | Freq: Four times a day (QID) | ORAL | Status: DC | PRN

## 2015-07-11 MED ORDER — IPRATROPIUM-ALBUTEROL 0.5-2.5 (3) MG/3ML IN SOLN: 3.0000 mL | RESPIRATORY_TRACT | Status: DC | PRN

## 2015-07-11 MED ORDER — CLORAZEPATE DIPOTASSIUM 7.5 MG PO TABS: 7.5000 mg | ORAL_TABLET | Freq: Every day | ORAL | Status: DC | PRN

## 2015-07-11 MED ORDER — ONDANSETRON HCL 4 MG/2ML IJ SOLN: 4.0000 mg | Freq: Four times a day (QID) | INTRAMUSCULAR | Status: DC | PRN

## 2015-07-11 MED ORDER — GI COCKTAIL ~~LOC~~: 30.0000 mL | Freq: Four times a day (QID) | ORAL | Status: DC | PRN

## 2015-07-11 MED ORDER — HEPARIN SODIUM (PORCINE) 5000 UNIT/ML IJ SOLN
5000.0000 [IU] | Freq: Three times a day (TID) | INTRAMUSCULAR | Status: DC
  Administered 2015-07-11 – 2015-07-13 (×5): 5000 [IU] via SUBCUTANEOUS
  Filled 2015-07-11 (×5): qty 1

## 2015-07-11 MED ORDER — PREDNISONE 50 MG PO TABS
60.0000 mg | ORAL_TABLET | Freq: Once | ORAL | Status: AC
  Administered 2015-07-11: 60 mg via ORAL
  Filled 2015-07-11: qty 1

## 2015-07-11 MED ORDER — ASPIRIN 325 MG PO TABS
325.0000 mg | ORAL_TABLET | Freq: Once | ORAL | Status: AC
  Administered 2015-07-11: 325 mg via ORAL
  Filled 2015-07-11: qty 1

## 2015-07-11 MED ORDER — SODIUM CHLORIDE 0.9% FLUSH
3.0000 mL | Freq: Two times a day (BID) | INTRAVENOUS | Status: DC
  Administered 2015-07-11 – 2015-07-12 (×3): 3 mL via INTRAVENOUS

## 2015-07-11 MED ORDER — BENZONATATE 100 MG PO CAPS: 200.0000 mg | ORAL_CAPSULE | Freq: Three times a day (TID) | ORAL | Status: DC | PRN

## 2015-07-11 MED ORDER — SODIUM CHLORIDE 0.9 % IV SOLN
INTRAVENOUS | Status: AC
  Administered 2015-07-11: 21:00:00 via INTRAVENOUS

## 2015-07-11 MED ORDER — IPRATROPIUM-ALBUTEROL 0.5-2.5 (3) MG/3ML IN SOLN
3.0000 mL | Freq: Once | RESPIRATORY_TRACT | Status: AC
  Administered 2015-07-11: 3 mL via RESPIRATORY_TRACT
  Filled 2015-07-11: qty 3

## 2015-07-11 MED ORDER — UMECLIDINIUM-VILANTEROL 62.5-25 MCG/INH IN AEPB
1.0000 | INHALATION_SPRAY | Freq: Every day | RESPIRATORY_TRACT | Status: DC
  Filled 2015-07-11: qty 14

## 2015-07-11 MED ORDER — LISINOPRIL 10 MG PO TABS
20.0000 mg | ORAL_TABLET | Freq: Every day | ORAL | Status: DC
  Administered 2015-07-11 – 2015-07-13 (×3): 20 mg via ORAL
  Filled 2015-07-11 (×3): qty 2

## 2015-07-11 MED ORDER — DOXYCYCLINE HYCLATE 100 MG PO TABS
100.0000 mg | ORAL_TABLET | Freq: Two times a day (BID) | ORAL | Status: DC
  Administered 2015-07-11 – 2015-07-13 (×4): 100 mg via ORAL
  Filled 2015-07-11 (×4): qty 1

## 2015-07-11 NOTE — H&P (Signed)
[facility] History and Physical  [name] [id number] DOB: 1935/09/03 DOA: 07/11/2015  Referring physician: Dr. Reather Converse  PCP: Alonza Bogus, MD   Chief Complaint: Flulike symptoms, chest discomfort; wheezing  HPI: [name] is a 80 y.o. female with past medical history significant for hypertension, anxiety, chronic bronchitis and systolic heart failure (last ejection fraction 35-40% in 2015); who presented to the emergency department with approximately 1 week of cough (nonproductive, without hemoptysis), shortness of breath, subjective fever, did not malaise, nausea/vomiting and intermittent diarrhea. Patient reports that her symptoms has been on and off over a week; and after coughing so much in the last 2 days has been noticing increased chest discomfort. On the day of admission patient chest discomfort was associated with shortness of breath. The pain affects both sides of her costal areas and radiates to the middle of her chest; worse with coughing, no diaphoresis and denies palpitations. Patient endorses no changes in her weight, denies orthopnea, and no swelling of her legs. In the ED EKG without any acute ischemic changes, troponin positive at 0.12, negative influenza by PCR and no acute infiltrates on her chest x-ray. On physical exam patient was experiencing expiratory wheezing and has positive rhonchi. [facility] hospitalist has been called to admit the patient for ACS rule out given her higher score 04 and mildly positive troponin.    Review of Systems:  Negative except as otherwise mentioned in history of present illness.  Past Medical History  Diagnosis Date  . Aneurysm ([place])   . Liver mass   . Lung, cysts, congenital     left lung cyst  . Kidney stone     embedded  . Hemorrhoids   . Anxiety   . Hypertension    Past Surgical History  Procedure Laterality Date  . Tonsillectomy and adenoidectomy    . Complete hysterectomy    . Benign breast tumors    .  Cholecystectomy    . Colonoscopy    . Colonoscopy N/A 09/22/2014    Procedure: COLONOSCOPY;  Surgeon: Rogene Houston, MD;  [facility];  Service: Endoscopy;  Laterality: N/A;  830 -- to be done in OR under fluoro   Social History:  reports that she quit smoking about 7 years ago. Her smoking use included Cigarettes. She started smoking about 38 years ago. She smoked 0.25 packs per day. She has never used smokeless tobacco. She reports that she does not drink alcohol or use illicit drugs.  Allergies  Allergen Reactions  . Alprazolam Nausea And Vomiting  . Codeine Nausea And Vomiting  . Percodan [Oxycodone-Aspirin] Nausea And Vomiting  . Valium Nausea And Vomiting    Family History  Problem Relation Age of Onset  . Heart disease    . Arthritis    . Lung disease    . Cancer    . Asthma    . Diabetes    . Kidney disease      Prior to Admission medications   Medication Sig Start Date End Date Taking? Authorizing Provider  aspirin EC 81 MG tablet Take 81 mg by mouth every morning.    Yes Historical Provider, MD  carvedilol (COREG) 12.5 MG tablet Take 12.5 mg by mouth 2 (two) times daily with a meal.   Yes Historical Provider, MD  clorazepate (TRANXENE) 7.5 MG tablet Take 7.5 mg by mouth daily as needed for anxiety. For nerves   Yes Historical Provider, MD  lisinopril (PRINIVIL,ZESTRIL) 20 MG tablet TAKE ONE TABLET  BY MOUTH DAILY. 01/26/15  Yes Herminio Commons, MD  naproxen (NAPROSYN) 500 MG tablet Take 500 mg by mouth 2 (two) times daily with a meal.  08/16/14  Yes Historical Provider, MD  PROAIR HFA 108 (90 BASE) MCG/ACT inhaler Inhale 2 puffs into the lungs every 6 (six) hours as needed for wheezing or shortness of breath.  02/08/14  Yes Historical Provider, MD  traMADol (ULTRAM) 50 MG tablet Take 50 mg by mouth daily as needed for moderate pain or severe pain. Maximum dose= 8 tablets per day   Yes Historical Provider, MD  Umeclidinium-Vilanterol (ANORO ELLIPTA) 62.5-25  MCG/INH AEPB Inhale 1 puff into the lungs daily.    Yes Historical Provider, MD   Physical Exam: Filed Vitals:   07/11/15 1335 07/11/15 1618 07/11/15 1747 07/11/15 1750  BP:  143/66 156/66   Pulse:  72 71   Temp:   98.9 F (37.2 C)   TempSrc:   Oral   Resp:  18 18   Height:   5\' 2"  (1.575 m)   Weight:    76.885 kg (169 lb 8 oz)  SpO2: 96% 95% 95%     Wt Readings from Last 3 Encounters:  07/11/15 76.885 kg (169 lb 8 oz)  06/24/15 80.74 kg (178 lb)  01/27/15 79.3 kg (174 lb 13.2 oz)    General:  Appears calm and in no acute distress. Patient with intermittent episode of coughing spells. No fever, no nausea, no vomiting, denies abdominal pain and currently without any chest pain. Eyes: PERRL, normal lids, irises & conjunctiva, no icterus, no nystagmus ENT: grossly normal hearing, mild dryness of her mucous membranes, no erythema, no exudates Neck: no LAD, masses or thyromegaly, no JVD Cardiovascular: RRR, no m/r/g. No LE edema. Respiratory: Positive expiratory wheezing, scattered rhonchi, no crackles; fair air movement. Normal respiratory effort and no use of accessory muscles. Abdomen: soft, nt, nondistended, positive bowel sounds Skin: no rash or induration seen on exam Musculoskeletal: grossly normal tone BUE/BLE Psychiatric: grossly normal mood and affect, speech fluent and appropriate Neurologic: grossly non-focal.          Labs on Admission:  Basic Metabolic Panel:  Recent Labs Lab 07/11/15 1117  NA 143  K 4.2  CL 103  CO2 33*  GLUCOSE 110*  BUN 18  CREATININE 0.78  CALCIUM 9.4   Liver Function Tests:  Recent Labs Lab 07/11/15 1117  AST 35  ALT 37  ALKPHOS 62  BILITOT 0.5  PROT 6.9  ALBUMIN 4.0    Recent Labs Lab 07/11/15 1117  LIPASE 35   CBC:  Recent Labs Lab 07/11/15 1117  WBC 5.6  HGB 13.7  HCT 42.3  MCV 84.9  PLT 216   Cardiac Enzymes:  Recent Labs Lab 07/11/15 1117 07/11/15 1257  TROPONINI 0.12* 0.10*   Radiological  Exams on Admission: Dg Chest 2 View  07/11/2015  CLINICAL DATA:  Coughing and sinus congestion starting 7 days ago, intermittent diarrhea, history hypertension EXAM: CHEST  2 VIEW COMPARISON:  01/27/2015 6 FINDINGS: Enlargement of cardiac silhouette. Mild tortuosity of thoracic aorta. Mediastinal contours and pulmonary vascularity normal. Lungs emphysematous but clear. No infiltrate, pleural effusion or pneumothorax. Diffuse osseous demineralization with degenerative disc disease changes thoracic spine. Prior cervical spine fusion and LEFT shoulder replacement. IMPRESSION: Enlargement of cardiac silhouette. COPD changes without acute infiltrate. Electronically Signed   By: Lavonia Dana M.D.   On: 07/11/2015 10:54    EKG:  No acute ischemic changes appreciated. Sinus rhythm and  regular rate.  Assessment/Plan 1-Chest pain: With harsh score of 4 secondary to risk factors. Atypical features. Elevated troponin. -No EKG changes suggesting acute ischemia -Most likely musculoskeletal from coughing -Will continue aspirin, beta blocker and ACE inhibitor -As needed Motrin for pain and PRN oxygen -Will cycle EKG, troponin and will check 2-D echo. -Cardiology service was consulted by ED physician and will follow any further recommendations.  2-essential Hypertension: -Stable and well controlled. Will continue current antihypertensive regimen  3-bronchitis with acute bronchospasm: Patient without specific history of COPD per records: -Will attempt rapid prednisone tapering (40 mg for 2 days, 20 mg for 2 days and then 10 mg for 3 days) -When necessary nebulizer treatments with DuoNeb; will use Pulmicort and Tessalon Perles for cough -Oxygen supplementation as needed and flutter valve -Will use doxycycline twice a day.  4-Flu-like symptoms: Symptoms has been present for over a week now. -Influenza panel done in the ED and negative -Will provide supportive care -Follow clinical response  5-Elevated  troponin: Treatment as mentioned in #1 -Patient currently without any chest pain  6-GERD (gastroesophageal reflux disease): Will use PPI.  7-chronic systolic heart failure: Last EF 35-40%. -Will follow daily weights and strict intake/output -Low sodium diet -Repeat 2-D echo -Appears to be currently compensated and without any signs of fluid overload.  8-anxiety: -Continue as needed clorazepate   Cardiology service consult to by ED  Code Status: Full code DVT Prophylaxis: Heparin Family Communication: No family at bedside Disposition Plan: Observation, telemetry bed; LOs > 2 midnights   Time spent: 50 minutes  Barton Dubois [facility] Pager [phone]

## 2015-07-11 NOTE — ED Notes (Signed)
Pt complains of chronic chest pain that has gotten worse since having a cough.

## 2015-07-11 NOTE — ED Provider Notes (Signed)
CSN: [id number]     Arrival date & time 07/11/15  1013 History   First MD Initiated Contact with Patient 07/11/15 1137     Chief Complaint  Patient presents with  . Cough  . Diarrhea   HPI Patient reports a 6 day history of cough, congestion, fever 102F, diarrhea.  She has developed shortness of breath and chest pain with cough over the last couple of days.  She reports no sick contacts or recent travel.   She has been using Mucinex, Dayquil, Nyquil, Tea w/ honey with minimal improvement so she came to ED today.  We discussed positive trop.  She notes a "heart history" but denies ever having had an MI.  Former smoker.  Has not taken any of her meds today.  Past Medical History  Diagnosis Date  . Aneurysm ([place])   . Liver mass   . Lung, cysts, congenital     left lung cyst  . Kidney stone     embedded  . Hemorrhoids   . Anxiety   . Hypertension    Past Surgical History  Procedure Laterality Date  . Tonsillectomy and adenoidectomy    . Complete hysterectomy    . Benign breast tumors    . Cholecystectomy    . Colonoscopy    . Colonoscopy N/A 09/22/2014    Procedure: COLONOSCOPY;  Surgeon: Rogene Houston, MD;  Location[facility];  Service: Endoscopy;  Laterality: N/A;  830 -- to be done in OR under fluoro   Family History  Problem Relation Age of Onset  . Heart disease    . Arthritis    . Lung disease    . Cancer    . Asthma    . Diabetes    . Kidney disease     Social History  Substance Use Topics  . Smoking status: Former Smoker -- 0.25 packs/day    Types: Cigarettes    Start date: 05/14/1977    Quit date: 05/28/2008  . Smokeless tobacco: Never Used  . Alcohol Use: No   OB History    No data available     Review of Systems  Constitutional: Positive for fever, chills and fatigue.  HENT: Positive for congestion, rhinorrhea and sore throat.   Eyes: Negative for photophobia and visual disturbance.  Respiratory: Positive for cough, chest tightness (chest pain  with cough) and shortness of breath.   Cardiovascular: Positive for chest pain (with cough).  Gastrointestinal: Positive for diarrhea. Negative for nausea, vomiting and abdominal pain.  Genitourinary: Negative for dysuria and hematuria.  Skin: Negative for rash.   Allergies  Alprazolam; Codeine; Percodan; and Valium  Home Medications   Prior to Admission medications   Medication Sig Start Date End Date Taking? Authorizing Provider  aspirin EC 81 MG tablet Take 81 mg by mouth every morning.    Yes Historical Provider, MD  carvedilol (COREG) 12.5 MG tablet Take 12.5 mg by mouth 2 (two) times daily with a meal.   Yes Historical Provider, MD  clorazepate (TRANXENE) 7.5 MG tablet Take 7.5 mg by mouth daily as needed for anxiety. For nerves   Yes Historical Provider, MD  lisinopril (PRINIVIL,ZESTRIL) 20 MG tablet TAKE ONE TABLET BY MOUTH DAILY. 01/26/15  Yes Herminio Commons, MD  naproxen (NAPROSYN) 500 MG tablet Take 500 mg by mouth 2 (two) times daily with a meal.  08/16/14  Yes Historical Provider, MD  PROAIR HFA 108 (90 BASE) MCG/ACT inhaler Inhale 2 puffs into the lungs every  6 (six) hours as needed for wheezing or shortness of breath.  02/08/14  Yes Historical Provider, MD  traMADol (ULTRAM) 50 MG tablet Take 50 mg by mouth daily as needed for moderate pain or severe pain. Maximum dose= 8 tablets per day   Yes Historical Provider, MD  Umeclidinium-Vilanterol (ANORO ELLIPTA) 62.5-25 MCG/INH AEPB Inhale 1 puff into the lungs daily.    Yes Historical Provider, MD   BP 141/62 mmHg  Pulse 74  Temp(Src) 97.5 F (36.4 C) (Oral)  Resp 18  Ht 5\' 2"  (1.575 m)  Wt 79.833 kg  BMI 32.18 kg/m2  SpO2 95% Physical Exam  Constitutional: She is oriented to person, place, and time. She appears well-developed. No distress.  HENT:  Head: Normocephalic and atraumatic.  Mouth/Throat: Oropharynx is clear and moist.  Eyes: EOM are normal. Pupils are equal, round, and reactive to light.  Neck: Neck supple.  No JVD present.  Cardiovascular: Normal rate, regular rhythm and intact distal pulses.   Pulmonary/Chest: Effort normal. No respiratory distress. She has wheezes (bibasilar expiratory wheeze).  Abdominal: Soft. Bowel sounds are normal. She exhibits no distension. There is no tenderness.  Musculoskeletal: Normal range of motion. She exhibits no edema.  Neurological: She is alert and oriented to person, place, and time. She exhibits normal muscle tone. Coordination normal.  Hard if hearing L>R  Skin: Skin is warm and dry. No rash noted. She is not diaphoretic.  Psychiatric: She has a normal mood and affect. Her behavior is normal. Judgment and thought content normal.    ED Course  Procedures (including critical care time) Labs Review Labs Reviewed  COMPREHENSIVE METABOLIC PANEL - Abnormal; Notable for the following:    CO2 33 (*)    Glucose, Bld 110 (*)    All other components within normal limits  TROPONIN I - Abnormal; Notable for the following:    Troponin I 0.12 (*)    All other components within normal limits  LIPASE, BLOOD  CBC    Imaging Review Dg Chest 2 View  07/11/2015  CLINICAL DATA:  Coughing and sinus congestion starting 7 days ago, intermittent diarrhea, history hypertension EXAM: CHEST  2 VIEW COMPARISON:  01/27/2015 6 FINDINGS: Enlargement of cardiac silhouette. Mild tortuosity of thoracic aorta. Mediastinal contours and pulmonary vascularity normal. Lungs emphysematous but clear. No infiltrate, pleural effusion or pneumothorax. Diffuse osseous demineralization with degenerative disc disease changes thoracic spine. Prior cervical spine fusion and LEFT shoulder replacement. IMPRESSION: Enlargement of cardiac silhouette. COPD changes without acute infiltrate. Electronically Signed   By: Lavonia Dana M.D.   On: 07/11/2015 10:54   I have personally reviewed and evaluated these images and lab results as part of my medical decision-making.   EKG Interpretation None       MDM   Final diagnoses:  None   1200: Trop 0.12. No elevated trops in past.  History of HFrEF.  Last echo 2015.  CXR negative for pna.  CMP, CBC unremarkable.  EKG appears similar to previous.  ASA ordered.  [facility] paged for obs admit.  Will need ACS r/o and likely an echo to evaluate for wall abnormalities.    1245: Duoneb, prednisone ordered.  Discussed care with [facility] hospitalist.  Asks for cardiology consult for further recommendations.  Consult placed.    1359: Will attempt to reach cards again.     Cortez Nacke Coto is a 80 y.o. female that presents with URI symptoms and new onset SOB and CP with cough.  She was found  to have an elevated trop to 0.12.  EKG unchanged from previous.  CXR negative for acute cardio-pulm processes.  CBC, CMP unremarkable.  PMH significant for HFrEF.  Last EF 35-40% in 2015.  More consistent with COPD exacerbation.  Dr Dyann Kief agrees to admit to obs telemetry bed.    Janora Norlander, DO 07/11/15 1404  Elnora Morrison, MD 07/11/15 [phone]

## 2015-07-11 NOTE — ED Notes (Signed)
Pt states she began having coughing and sinus congestion starting 7 days ago with intermittent diarrhea. Pt denies any emesis.

## 2015-07-12 ENCOUNTER — Encounter (HOSPITAL_COMMUNITY): Payer: Self-pay | Admitting: Cardiology

## 2015-07-12 ENCOUNTER — Observation Stay (HOSPITAL_BASED_OUTPATIENT_CLINIC_OR_DEPARTMENT_OTHER): Payer: Medicare Other

## 2015-07-12 DIAGNOSIS — I428 Other cardiomyopathies: Secondary | ICD-10-CM | POA: Diagnosis present

## 2015-07-12 DIAGNOSIS — I447 Left bundle-branch block, unspecified: Secondary | ICD-10-CM | POA: Diagnosis present

## 2015-07-12 DIAGNOSIS — R0600 Dyspnea, unspecified: Secondary | ICD-10-CM

## 2015-07-12 DIAGNOSIS — I429 Cardiomyopathy, unspecified: Secondary | ICD-10-CM

## 2015-07-12 DIAGNOSIS — R0789 Other chest pain: Secondary | ICD-10-CM

## 2015-07-12 DIAGNOSIS — J441 Chronic obstructive pulmonary disease with (acute) exacerbation: Secondary | ICD-10-CM | POA: Diagnosis not present

## 2015-07-12 DIAGNOSIS — R079 Chest pain, unspecified: Secondary | ICD-10-CM | POA: Diagnosis not present

## 2015-07-12 DIAGNOSIS — R7989 Other specified abnormal findings of blood chemistry: Secondary | ICD-10-CM

## 2015-07-12 LAB — CBC
HCT: 39.5 % (ref 36.0–46.0)
Hemoglobin: 13 g/dL (ref 12.0–15.0)
MCH: 27.4 pg (ref 26.0–34.0)
MCHC: 32.9 g/dL (ref 30.0–36.0)
MCV: 83.3 fL (ref 78.0–100.0)
Platelets: 227 10*3/uL (ref 150–400)
RBC: 4.74 MIL/uL (ref 3.87–5.11)
RDW: 13.4 % (ref 11.5–15.5)
WBC: 4 10*3/uL (ref 4.0–10.5)

## 2015-07-12 LAB — BASIC METABOLIC PANEL
Anion gap: 7 (ref 5–15)
BUN: 16 mg/dL (ref 6–20)
CO2: 27 mmol/L (ref 22–32)
Calcium: 9.2 mg/dL (ref 8.9–10.3)
Chloride: 102 mmol/L (ref 101–111)
Creatinine, Ser: 0.54 mg/dL (ref 0.44–1.00)
GFR calc Af Amer: >60 mL/min (ref >60)
GFR calc non Af Amer: >60 mL/min (ref >60)
Glucose, Bld: 139 mg/dL — ABNORMAL HIGH (ref 65–99)
Potassium: 4.1 mmol/L (ref 3.5–5.1)
Sodium: 136 mmol/L (ref 135–145)

## 2015-07-12 LAB — TROPONIN I
Troponin I: 0.07 ng/mL — ABNORMAL HIGH (ref <0.031)
Troponin I: 0.08 ng/mL — ABNORMAL HIGH (ref <0.031)

## 2015-07-12 MED ORDER — IPRATROPIUM-ALBUTEROL 0.5-2.5 (3) MG/3ML IN SOLN: 3.0000 mL | Freq: Four times a day (QID) | RESPIRATORY_TRACT | Status: DC

## 2015-07-12 MED ORDER — LEVALBUTEROL HCL 0.63 MG/3ML IN NEBU
0.6300 mg | INHALATION_SOLUTION | Freq: Three times a day (TID) | RESPIRATORY_TRACT | Status: DC
  Administered 2015-07-12 – 2015-07-13 (×4): 0.63 mg via RESPIRATORY_TRACT
  Filled 2015-07-12 (×4): qty 3

## 2015-07-12 MED ORDER — SPIRONOLACTONE 25 MG PO TABS
12.5000 mg | ORAL_TABLET | Freq: Every day | ORAL | Status: DC
  Administered 2015-07-12: 12.5 mg via ORAL
  Filled 2015-07-12: qty 1

## 2015-07-12 MED ORDER — TIOTROPIUM BROMIDE MONOHYDRATE 18 MCG IN CAPS
18.0000 ug | ORAL_CAPSULE | Freq: Every day | RESPIRATORY_TRACT | Status: DC
  Filled 2015-07-12: qty 5

## 2015-07-12 NOTE — Progress Notes (Signed)
    Consulting cardiologist: Dr. Satira Sark  Please see full consultation note from earlier in the day. Patient had a follow-up echocardiogram which shows overall stable LVEF at 35%. At this point would recommend continuing medical therapy. We did add low-dose Aldactone to her Coreg and ACE inhibitor. No further inpatient cardiac workup is anticipated. We will sign off for now and arrange a follow-up office visit after discharge.   Satira Sark, M.D., F.A.C.C.

## 2015-07-12 NOTE — Plan of Care (Signed)
Problem: Pain Managment: Goal: General experience of comfort will improve Outcome: Progressing Pt complained of pain in her head, not of a headache but just an ache. Given pain medication, reassessed and pt states the ache was not there anymore.   Problem: Physical Regulation: Goal: Ability to maintain clinical measurements within normal limits will improve Outcome: Progressing See flowsheet

## 2015-07-12 NOTE — Progress Notes (Signed)
Subjective: She was admitted yesterday with chest pain. Her troponin level is up but it seems like her chest pain is more likely related to acute respiratory illness with cough and congestion. However she does have significant cardiac risk factors.  Objective: Vital signs in last 24 hours: Temp:  [97.3 F (36.3 C)-98.9 F (37.2 C)] 98.1 F (36.7 C) (02/28 0739) Pulse Rate:  [60-101] 62 (02/28 0739) Resp:  [16-20] 18 (02/28 0739) BP: (120-159)/(52-66) 120/53 mmHg (02/28 0739) SpO2:  [92 %-96 %] 93 % (02/28 0741) Weight:  [76.885 kg (169 lb 8 oz)-79.833 kg (176 lb)] 76.885 kg (169 lb 8 oz) (02/27 1750) Weight change:  Last BM Date: 07/09/15  Intake/Output from previous day:    PHYSICAL EXAM General appearance: alert, cooperative and mild distress Resp: rhonchi bilaterally Cardio: regular rate and rhythm, S1, S2 normal, no murmur, click, rub or gallop GI: soft, non-tender; bowel sounds normal; no masses,  no organomegaly Extremities: extremities normal, atraumatic, no cyanosis or edema  Lab Results:  Results for orders placed or performed during the hospital encounter of 07/11/15 (from the past 48 hour(s))  Lipase, blood     Status: None   Collection Time: 07/11/15 11:17 AM  Result Value Ref Range   Lipase 35 11 - 51 U/L  Comprehensive metabolic panel     Status: Abnormal   Collection Time: 07/11/15 11:17 AM  Result Value Ref Range   Sodium 143 135 - 145 mmol/L   Potassium 4.2 3.5 - 5.1 mmol/L   Chloride 103 101 - 111 mmol/L   CO2 33 (H) 22 - 32 mmol/L   Glucose, Bld 110 (H) 65 - 99 mg/dL   BUN 18 6 - 20 mg/dL   Creatinine, Ser 0.78 0.44 - 1.00 mg/dL   Calcium 9.4 8.9 - 10.3 mg/dL   Total Protein 6.9 6.5 - 8.1 g/dL   Albumin 4.0 3.5 - 5.0 g/dL   AST 35 15 - 41 U/L   ALT 37 14 - 54 U/L   Alkaline Phosphatase 62 38 - 126 U/L   Total Bilirubin 0.5 0.3 - 1.2 mg/dL   GFR calc non Af Amer >60 >60 mL/min   GFR calc Af Amer >60 >60 mL/min    Comment: (NOTE) The eGFR has  been calculated using the CKD EPI equation. This calculation has not been validated in all clinical situations. eGFR's persistently <60 mL/min signify possible Chronic Kidney Disease.    Anion gap 7 5 - 15  CBC     Status: None   Collection Time: 07/11/15 11:17 AM  Result Value Ref Range   WBC 5.6 4.0 - 10.5 K/uL   RBC 4.98 3.87 - 5.11 MIL/uL   Hemoglobin 13.7 12.0 - 15.0 g/dL   HCT 42.3 36.0 - 46.0 %   MCV 84.9 78.0 - 100.0 fL   MCH 27.5 26.0 - 34.0 pg   MCHC 32.4 30.0 - 36.0 g/dL   RDW 13.6 11.5 - 15.5 %   Platelets 216 150 - 400 K/uL  Troponin I     Status: Abnormal   Collection Time: 07/11/15 11:17 AM  Result Value Ref Range   Troponin I 0.12 (H) <0.031 ng/mL    Comment:        PERSISTENTLY INCREASED TROPONIN VALUES IN THE RANGE OF 0.04-0.49 ng/mL CAN BE SEEN IN:       -UNSTABLE ANGINA       -CONGESTIVE HEART FAILURE       -MYOCARDITIS       -  CHEST TRAUMA       -ARRYHTHMIAS       -LATE PRESENTING MYOCARDIAL INFARCTION       -COPD   CLINICAL FOLLOW-UP RECOMMENDED.   Troponin I     Status: Abnormal   Collection Time: 07/11/15 12:57 PM  Result Value Ref Range   Troponin I 0.10 (H) <0.031 ng/mL    Comment:        PERSISTENTLY INCREASED TROPONIN VALUES IN THE RANGE OF 0.04-0.49 ng/mL CAN BE SEEN IN:       -UNSTABLE ANGINA       -CONGESTIVE HEART FAILURE       -MYOCARDITIS       -CHEST TRAUMA       -ARRYHTHMIAS       -LATE PRESENTING MYOCARDIAL INFARCTION       -COPD   CLINICAL FOLLOW-UP RECOMMENDED.   Influenza panel by PCR (type A & B, H1N1)     Status: None   Collection Time: 07/11/15  1:07 PM  Result Value Ref Range   Influenza A By PCR NEGATIVE NEGATIVE   Influenza B By PCR NEGATIVE NEGATIVE   H1N1 flu by pcr NOT DETECTED NOT DETECTED    Comment:        The Xpert Flu assay (FDA approved for nasal aspirates or washes and nasopharyngeal swab specimens), is intended as an aid in the diagnosis of influenza and should not be used as a sole basis for  treatment.   Troponin I (q 6hr x 3)     Status: Abnormal   Collection Time: 07/11/15  8:12 PM  Result Value Ref Range   Troponin I 0.07 (H) <0.031 ng/mL    Comment:        PERSISTENTLY INCREASED TROPONIN VALUES IN THE RANGE OF 0.04-0.49 ng/mL CAN BE SEEN IN:       -UNSTABLE ANGINA       -CONGESTIVE HEART FAILURE       -MYOCARDITIS       -CHEST TRAUMA       -ARRYHTHMIAS       -LATE PRESENTING MYOCARDIAL INFARCTION       -COPD   CLINICAL FOLLOW-UP RECOMMENDED.   Magnesium     Status: None   Collection Time: 07/11/15  8:12 PM  Result Value Ref Range   Magnesium 2.0 1.7 - 2.4 mg/dL  Phosphorus     Status: None   Collection Time: 07/11/15  8:12 PM  Result Value Ref Range   Phosphorus 2.8 2.5 - 4.6 mg/dL  Basic metabolic panel     Status: Abnormal   Collection Time: 07/12/15  1:59 AM  Result Value Ref Range   Sodium 136 135 - 145 mmol/L    Comment: DELTA CHECK NOTED   Potassium 4.1 3.5 - 5.1 mmol/L   Chloride 102 101 - 111 mmol/L   CO2 27 22 - 32 mmol/L   Glucose, Bld 139 (H) 65 - 99 mg/dL   BUN 16 6 - 20 mg/dL   Creatinine, Ser 0.54 0.44 - 1.00 mg/dL   Calcium 9.2 8.9 - 10.3 mg/dL   GFR calc non Af Amer >60 >60 mL/min   GFR calc Af Amer >60 >60 mL/min    Comment: (NOTE) The eGFR has been calculated using the CKD EPI equation. This calculation has not been validated in all clinical situations. eGFR's persistently <60 mL/min signify possible Chronic Kidney Disease.    Anion gap 7 5 - 15  CBC     Status: None  Collection Time: 07/12/15  1:59 AM  Result Value Ref Range   WBC 4.0 4.0 - 10.5 K/uL   RBC 4.74 3.87 - 5.11 MIL/uL   Hemoglobin 13.0 12.0 - 15.0 g/dL   HCT 39.5 36.0 - 46.0 %   MCV 83.3 78.0 - 100.0 fL   MCH 27.4 26.0 - 34.0 pg   MCHC 32.9 30.0 - 36.0 g/dL   RDW 13.4 11.5 - 15.5 %   Platelets 227 150 - 400 K/uL  Troponin I     Status: Abnormal   Collection Time: 07/12/15  1:59 AM  Result Value Ref Range   Troponin I 0.07 (H) <0.031 ng/mL    Comment:         PERSISTENTLY INCREASED TROPONIN VALUES IN THE RANGE OF 0.04-0.49 ng/mL CAN BE SEEN IN:       -UNSTABLE ANGINA       -CONGESTIVE HEART FAILURE       -MYOCARDITIS       -CHEST TRAUMA       -ARRYHTHMIAS       -LATE PRESENTING MYOCARDIAL INFARCTION       -COPD   CLINICAL FOLLOW-UP RECOMMENDED.     ABGS No results for input(s): PHART, PO2ART, TCO2, HCO3 in the last 72 hours.  Invalid input(s): PCO2 CULTURES No results found for this or any previous visit (from the past 240 hour(s)). Studies/Results: Dg Chest 2 View  07/11/2015  CLINICAL DATA:  Coughing and sinus congestion starting 7 days ago, intermittent diarrhea, history hypertension EXAM: CHEST  2 VIEW COMPARISON:  01/27/2015 6 FINDINGS: Enlargement of cardiac silhouette. Mild tortuosity of thoracic aorta. Mediastinal contours and pulmonary vascularity normal. Lungs emphysematous but clear. No infiltrate, pleural effusion or pneumothorax. Diffuse osseous demineralization with degenerative disc disease changes thoracic spine. Prior cervical spine fusion and LEFT shoulder replacement. IMPRESSION: Enlargement of cardiac silhouette. COPD changes without acute infiltrate. Electronically Signed   By: Lavonia Dana M.D.   On: 07/11/2015 10:54    Medications:  Prior to Admission:  Prescriptions prior to admission  Medication Sig Dispense Refill Last Dose  . aspirin EC 81 MG tablet Take 81 mg by mouth every morning.    07/10/2015 at Unknown time  . carvedilol (COREG) 12.5 MG tablet Take 12.5 mg by mouth 2 (two) times daily with a meal.   07/10/2015 at 0900  . clorazepate (TRANXENE) 7.5 MG tablet Take 7.5 mg by mouth daily as needed for anxiety. For nerves   Past Week at Unknown time  . lisinopril (PRINIVIL,ZESTRIL) 20 MG tablet TAKE ONE TABLET BY MOUTH DAILY. 90 tablet 2 07/10/2015 at Unknown time  . naproxen (NAPROSYN) 500 MG tablet Take 500 mg by mouth 2 (two) times daily with a meal.    07/10/2015 at Unknown time  . PROAIR HFA 108 (90 BASE)  MCG/ACT inhaler Inhale 2 puffs into the lungs every 6 (six) hours as needed for wheezing or shortness of breath.    07/10/2015 at Unknown time  . traMADol (ULTRAM) 50 MG tablet Take 50 mg by mouth daily as needed for moderate pain or severe pain. Maximum dose= 8 tablets per day   Past Week at Unknown time  . Umeclidinium-Vilanterol (ANORO ELLIPTA) 62.5-25 MCG/INH AEPB Inhale 1 puff into the lungs daily.    07/10/2015 at Unknown time   Scheduled: . aspirin EC  81 mg Oral Daily  . budesonide (PULMICORT) nebulizer solution  0.25 mg Nebulization BID  . carvedilol  12.5 mg Oral BID WC  . doxycycline  100 mg Oral Q12H  . feeding supplement (ENSURE ENLIVE)  237 mL Oral BID BM  . heparin  5,000 Units Subcutaneous 3 times per day  . levalbuterol  0.63 mg Nebulization Q8H  . lisinopril  20 mg Oral Daily  . pantoprazole  40 mg Oral Daily  . pneumococcal 23 valent vaccine  0.5 mL Intramuscular Tomorrow-1000  . predniSONE  40 mg Oral Q breakfast  . sodium chloride flush  3 mL Intravenous Q12H  . umeclidinium-vilanterol  1 puff Inhalation Daily   Continuous:  HRV:[id number] **OR** acetaminophen, benzonatate, clorazepate, gi cocktail, ondansetron **OR** ondansetron (ZOFRAN) IV, traMADol  Assesment: She has chest pain. Her troponin level is elevated so she is going to need cardiac evaluation. She has COPD exacerbation with acute bronchitis and that's being treated. She says she feels a little bit better. Principal Problem:   Chest pain Active Problems:   Hypertension   COPD exacerbation (HCC)   Flu-like symptoms   Elevated troponin   Acute bronchitis   GERD (gastroesophageal reflux disease)   Bronchospasm    Plan: Continue current treatments request cardiology consultation      Mckensey Berghuis L 07/12/2015, 9:05 AM

## 2015-07-12 NOTE — Consult Note (Signed)
   [facility] CM Inpatient Consult   07/12/2015  Telana Zanella Fredricksen [birth year] [id number]   Spoke with patient at bedside regarding [facility] services. Patient does not want to participate with [facility] at this time. Patient given [facility] brochure and contact information for future reference, voices appreciation of information.    Inpatient case manager aware that patient offered [facility] case management services but declined.   Of note, [facility] Care Management services would not replace or interfere with any services that are arranged by inpatient case management or social work. For additional questions or referrals please contact:   Royetta Crochet. Laymond Purser, RN, BSN, [facility] Liaison [phone]

## 2015-07-12 NOTE — Consult Note (Signed)
CARDIOLOGY CONSULT NOTE   Patient ID: HASMIK DELATORRE MRN: [id number] DOB/AGE: 1935-08-22 80 y.o.  Admit Date: 07/11/2015 Requesting Physician: Sinda Du MD Primary Physician: Alonza Bogus, MD Consulting Cardiologist: Rozann Lesches MD Primary Cardiologist: Kate Sable MD [facility]) Reason for Consultation: Chest Pain  Clinical Summary Ms. Dogan is a 80 y.o.female with history of hypertension, LBBB, NICM with LVEF 35-40%. She presents to the ER with complaints of one week duration of cough and congestion, dyspnea, and increased chest discomfort. This is located bilateral chest with radiation into th middle of the chest. She was found to have mildly elevated troponin I at 0.12; 0.10, 0.07, 0.07, and 0.08 respectively. As a result of elevated troponin I we are asked to evaluate further.   On arrival to ER. BP 141/62, HR 74, O2 Sat 95%, afebrile. CXR demonstrated changes of COPD without acute infiltrate. Other than troponin I, labs were essentially unremarkable. ECG demonstrated NSR with LBBB rate of 69 bpm. She was treated with ASA 325 mg, duoneb tx and po prednisone.   She states that she has chest soreness all of the time but this has worsened with the cough and congestion with and is reproducible with coughing.   Allergies  Allergen Reactions  . Alprazolam Nausea And Vomiting  . Codeine Nausea And Vomiting  . Percodan [Oxycodone-Aspirin] Nausea And Vomiting  . Valium Nausea And Vomiting    Medications Scheduled Medications: . aspirin EC  81 mg Oral Daily  . budesonide (PULMICORT) nebulizer solution  0.25 mg Nebulization BID  . carvedilol  12.5 mg Oral BID WC  . doxycycline  100 mg Oral Q12H  . feeding supplement (ENSURE ENLIVE)  237 mL Oral BID BM  . heparin  5,000 Units Subcutaneous 3 times per day  . levalbuterol  0.63 mg Nebulization Q8H  . lisinopril  20 mg Oral Daily  . pantoprazole  40 mg Oral Daily  . pneumococcal 23 valent vaccine  0.5 mL  Intramuscular Tomorrow-1000  . predniSONE  40 mg Oral Q breakfast  . sodium chloride flush  3 mL Intravenous Q12H  . tiotropium  18 mcg Inhalation Daily    PRN Medications: acetaminophen **OR** acetaminophen, benzonatate, clorazepate, gi cocktail, ondansetron **OR** ondansetron (ZOFRAN) IV, traMADol   Past Medical History  Diagnosis Date  . Thoracic ascending aortic aneurysm (HCC)     4.3 cm April 2016  . Liver mass   . Lung, cysts, congenital     Left lung cyst  . Nephrolithiasis     Embedded  . Hemorrhoids   . Anxiety   . Essential hypertension   . COPD (chronic obstructive pulmonary disease) ([place])   . Nonischemic cardiomyopathy (HCC)     LVEF 35-40% 2015    Past Surgical History  Procedure Laterality Date  . Tonsillectomy and adenoidectomy    . Complete hysterectomy    . Benign breast tumors    . Cholecystectomy    . Colonoscopy    . Colonoscopy N/A 09/22/2014    Procedure: COLONOSCOPY;  Surgeon: Rogene Houston, MD;  Location: [facility];  Service: Endoscopy;  Laterality: N/A;  830 -- to be done in OR under fluoro    Family History  Problem Relation Age of Onset  . Heart disease Mother   . Lung cancer Brother   . Heart disease Sister   . Diabetes Brother   . Heart disease Brother   . Aneurysm Father     Social History Ms. Sinkler reports that she quit  smoking about 7 years ago. Her smoking use included Cigarettes. She started smoking about 38 years ago. She smoked 0.25 packs per day. She has never used smokeless tobacco. Ms. Milnor reports that she does not drink alcohol.  Review of Systems Complete review of systems are found to be negative unless outlined in H&P above.  Physical Examination Blood pressure 120/53, pulse 62, temperature 98.1 F (36.7 C), temperature source Oral, resp. rate 18, height 5\' 2"  (1.575 m), weight 169 lb 8 oz (76.885 kg), SpO2 95 %. No intake or output data in the 24 hours ending 07/12/15 1027  Telemetry: SR with LBBB rates  up to 80-90 with coughing.   GEN: No acute distress HEENT: Conjunctiva and lids normal, oropharynx clear with moist mucosa. Neck: Supple, no elevated JVP or carotid bruits, no thyromegaly. Lungs: Inspiratory and expiratory rales and rhonchi with frequent coughing with deep breathing.  Cardiac: Regular rate and rhythm, no S3 or significant systolic murmur, no pericardial rub. Abdomen: Soft, nontender, no hepatomegaly, bowel sounds present, no guarding or rebound. Extremities: No pitting edema, distal pulses 2+. Skin: Warm and dry. Musculoskeletal: No kyphosis. Neuropsychiatric: Alert and oriented x3, affect grossly appropriate.  Prior Cardiac Testing/Procedures 1. Echocardiogram 08/21/2013 Procedure narrative: Transthoracic echocardiography. Image quality was suboptimal, due to poor sound transmission. - Left ventricle: The cavity size was at the upper limits of normal. Systolic function was moderately reduced. The estimated ejection fraction was in the range of 35% to 40%. Doppler parameters are consistent with abnormal left ventricular relaxation (grade 1 diastolic dysfunction). Doppler parameters are consistent with high ventricular filling pressure. Mild to moderate concentric left ventricular hypertrophy. - Aortic valve: Mildly calcified annulus. Mild regurgitation. - Aorta: Aortic root dimension: 21mm (ED). - Aortic root: The aortic root was normal in size. - Mitral valve: Mildly thickened leaflets . Mild regurgitation. - Left atrium: The atrium was mildly dilated. - Right atrium: The atrium was mildly dilated. - Pericardium, extracardiac: A trivial pericardial effusion was identified.  2. Cardiac Cath 2005 1. Left main coronary artery was normal. 2. Left anterior descending artery was normal. 3. First and second diagonal branches were small. 4. Circumflex coronary artery was nondominant and normal. 5. The right coronary artery was dominant  and normal. 6. RAO ventriculography: RAO ventriculography showed mild left ventricular  cavity enlargement with mild global hypokinesis. The EF was in the 40%  range. There was no significant MR. LV pressure was 162/19. Aortic  pressure was 154/77.  Lab Results  Basic Metabolic Panel:  Recent Labs Lab 07/11/15 1117 07/11/15 2012 07/12/15 0159  NA 143  --  136  K 4.2  --  4.1  CL 103  --  102  CO2 33*  --  27  GLUCOSE 110*  --  139*  BUN 18  --  16  CREATININE 0.78  --  0.54  CALCIUM 9.4  --  9.2  MG  --  2.0  --   PHOS  --  2.8  --     Liver Function Tests:  Recent Labs Lab 07/11/15 1117  AST 35  ALT 37  ALKPHOS 62  BILITOT 0.5  PROT 6.9  ALBUMIN 4.0    CBC:  Recent Labs Lab 07/11/15 1117 07/12/15 0159  WBC 5.6 4.0  HGB 13.7 13.0  HCT 42.3 39.5  MCV 84.9 83.3  PLT 216 227    Cardiac Enzymes:  Recent Labs Lab 07/11/15 1117 07/11/15 1257 07/11/15 2012 07/12/15 0159 07/12/15 0902  TROPONINI 0.12* 0.10* 0.07*  0.07* 0.08*    Radiology: Dg Chest 2 View  07/11/2015  CLINICAL DATA:  Coughing and sinus congestion starting 7 days ago, intermittent diarrhea, history hypertension EXAM: CHEST  2 VIEW COMPARISON:  01/27/2015 6 FINDINGS: Enlargement of cardiac silhouette. Mild tortuosity of thoracic aorta. Mediastinal contours and pulmonary vascularity normal. Lungs emphysematous but clear. No infiltrate, pleural effusion or pneumothorax. Diffuse osseous demineralization with degenerative disc disease changes thoracic spine. Prior cervical spine fusion and LEFT shoulder replacement. IMPRESSION: Enlargement of cardiac silhouette. COPD changes without acute infiltrate. Electronically Signed   By: Lavonia Dana M.D.   On: 07/11/2015 10:54    ECG: SR with LBBB rate of 69 bpm.    Impression and Recommendations  1. Elevated troponin I: In setting of COPD exacerbation and bronchitis, not ACS. Chest pain is worsened with coughing over the last week,  but has had constant soreness at baseline. Some pain is reproducible with palpation.   Would not plan ischemic testing at this time, as she is continuing to be treated for bronchitis. Last cardiac cath demonstrated normal coronary anatomy without evidence of CAD. She has not had any ischemic testing and can be considered as OP if clinically warranted once she recovers from COPD exacerbation. Continue ASA   2. NICM: Most recent echocardiogram 08/26/2013 demonstrated EF of 35%-40% with Grade I diastolic dysfunction. She is on carvedilol 12.5 mg BID. She will continue ACE. There is no evidence of fluid overload at this time. Follow-up echocardiogram pending.  3. COPD exacerbation with Bronchitis: Being treated by Dr. Luan Pulling.    Signed: Phill Myron. Lawrence NP [place]  07/12/2015, 10:27 AM Co-Sign MD   Attending note:  Patient seen and examined. Reviewed records and updated the chart. Modified above note by Ms. Lawrence NP. Ms. Zepf presents with a one-week history of progressive productive cough and wheezing, currently being managed for COPD exacerbation by Dr. Luan Pulling. She has a history of nonischemic cardiomyopathy with LVEF 35-40% and follows with Dr. Bronson Ing. She reports stability in her medical regimen, tolerating carvedilol and ACE inhibitor without significant difficulties over time. We are consulted secondary to finding of minor increase in troponin I level, relatively flat pattern. She has also had chest discomfort associated with her coughing, describes mainly a generalized soreness, nonexertional.  On examination she is in a bedside chair, appears comfortable at rest. Heart rate is in the 0000000, systolic blood pressure [id number]. Lungs exhibit decreased breath sounds with expiratory wheezes and prolonged expiratory phase, scattered rhonchi. Cardiac exam reveals RRR with indistinct PMI. She has no pitting edema. Lab work shows creatinine 0.5, troponin I 0.12 down to 0.10 down to 0.08. Chest x-ray  shows cardiomegaly with COPD but no active infiltrates. ECG shows sinus rhythm with old left bundle branch block.  Minor elevation in troponin I is most likely secondary to COPD exacerbation with demand ischemia, does not represent true ACS. She has a history of a nonischemic cardiomyopathy. We will obtain a follow-up echocardiogram, her last study being in 2015. No plans to significantly modify her current regimen which includes Coreg and ACE inhibitor, although low-dose Aldactone would be a consideration to round things out.  Satira Sark, M.D., F.A.C.C.

## 2015-07-12 NOTE — Care Management Obs Status (Signed)
MEDICARE OBSERVATION STATUS NOTIFICATION   Patient Details  Name: [name] MRN: [id number] Date of Birth: 1936/04/24   Medicare Observation Status Notification Given:  Yes    Alvie Heidelberg, RN 07/12/2015, 8:53 AM

## 2015-07-12 NOTE — Progress Notes (Signed)
*  PRELIMINARY RESULTS* Echocardiogram 2D Echocardiogram has been performed.  [name] 07/12/2015, 4:33 PM

## 2015-07-12 NOTE — Care Management Note (Signed)
Case Management Note  Patient Details  Name: [name] MRN: [id number] Date of Birth: 03/22/1936  Subjective/Objective:       Patietn alert oreitned from home with daughter and grandchildren. Drives self and independent. No CM needs identified.             Action/Plan: Home with Self care.  Expected Discharge Date:                  Expected Discharge Plan:  Home/Self Care  In-House Referral:     Discharge planning Services  CM Consult  Post Acute Care Choice:    Choice offered to:     DME Arranged:    DME Agency:     HH Arranged:    HH Agency:     Status of Service:  In process, will continue to follow  Medicare Important Message Given:    Date Medicare IM Given:    Medicare IM give by:    Date Additional Medicare IM Given:    Additional Medicare Important Message give by:     If discussed at [place] of Stay Meetings, dates discussed:    Additional Comments:  Alvie Heidelberg, RN 07/12/2015, 5:20 PM

## 2015-07-12 NOTE — Progress Notes (Signed)
Initial Nutrition Assessment   Pt meets criteria for SEVERE MALNUTRITION in the context of ACUTE ILLNESS as evidenced by Loss of >5% bw in 3 weeks and an estimated  energy intake that met < or equal to 50% of needs  for > or equal to 5 days.  DOCUMENTATION CODES:  Obesity unspecified, Severe malnutrition in context of acute illness/injury  INTERVENTION:  Ensure Enlive po BID, each supplement provides 350 kcal and 20 grams of protein  Multiple meal preferences noted  NUTRITION DIAGNOSIS:  Malnutrition related to nausea, poor appetite, acute illness, diarrhea as evidenced by Loss of >5% bw in 3 weeks and an estimated energy intake that met < or equal to 50% of needs  for > or equal to 5 days.  GOAL:  Patient will meet greater than or equal to 90% of their needs  MONITOR:  PO intake, Supplement acceptance, Labs  REASON FOR ASSESSMENT:  Malnutrition Screening Tool    ASSESSMENT:  80 y/o female PMHx HTN, Anxiety, Chronic bronchitis, HF who presented with 1 week cough, SOB, Subjective fever, n/v and intermittent diarrhea. Admitted for chest pain and bronchitis with acute bronchospasm  Pt reports that this last week has been extremely difficult for her. She has had no appetite and was "forcing" herself to eat. She would only eat small bites of food and states she did not eat any meals. She noted nausea (never actually threw up) and intermittent diarrhea. She also complained of worsening taste changes. She states she had chronic changes in her taste, but this past week her taste has been especially off. She did not drink any nutritional supplements.   She knows she has lost weight. It appears per EMR documentation that her UBW had been 180-185 lbs. She states she was alarmed when she found out it had dropped in September and was even more surprised to have heard it is now in the 160's.   She still reports a poor appetite. Today on RD visit she had eaten ~40% oh her lunch. She noted that the  meals she has been receiving are not what she usually eats and made some special meal requests. These were noted. She as agreeable to Ensure Enlive in light of ongoing poor appetite.   NFPE:no fat/msucle wasting noted  Labs reviewed: Hyperglycemia  Diet Order:  Diet Heart Room service appropriate?: Yes; Fluid consistency:: Thin  Skin:  Reviewed, no issues  Last BM:  2/25  Height:  Ht Readings from Last 1 Encounters:  07/11/15 _0  (1.575 m)   Weight:  Wt Readings from Last 1 Encounters:  07/12/15 166 lb 5.5 oz (75.454 kg)   Wt Readings from Last 10 Encounters:  07/12/15 166 lb 5.5 oz (75.454 kg)  06/24/15 178 lb (80.74 kg)  01/27/15 174 lb 13.2 oz (79.3 kg)  09/22/14 185 lb (83.915 kg)  08/25/14 186 lb (84.369 kg)  02/25/14 181 lb (82.101 kg)  11/27/13 177 lb (80.287 kg)  08/20/13 181 lb 14.1 oz (82.5 kg)  06/09/13 178 lb (80.74 kg)  11/24/12 180 lb (81.647 kg)   Ideal Body Weight:  50 kg  BMI:  Body mass index is 30.42 kg/(m^2).  Estimated Nutritional Needs:  Kcal:  1600-1800 (21-24 kcal/kg bw) Protein:  55-65 g Pro Fluid:  1.6-1.8 liters fluid EDUCATION NEEDS:  No education needs identified at this time  Burtis Junes RD, LDN Nutrition Pager: [phone] 07/12/2015 2:46 PM

## 2015-07-13 DIAGNOSIS — J441 Chronic obstructive pulmonary disease with (acute) exacerbation: Secondary | ICD-10-CM | POA: Diagnosis not present

## 2015-07-13 DIAGNOSIS — E43 Unspecified severe protein-calorie malnutrition: Secondary | ICD-10-CM | POA: Diagnosis present

## 2015-07-13 DIAGNOSIS — R079 Chest pain, unspecified: Secondary | ICD-10-CM | POA: Diagnosis not present

## 2015-07-13 DIAGNOSIS — I248 Other forms of acute ischemic heart disease: Secondary | ICD-10-CM | POA: Diagnosis present

## 2015-07-13 MED ORDER — BENZONATATE 200 MG PO CAPS: 200.0000 mg | ORAL_CAPSULE | Freq: Three times a day (TID) | ORAL | Status: DC | PRN

## 2015-07-13 MED ORDER — DOXYCYCLINE HYCLATE 100 MG PO TABS: 100.0000 mg | ORAL_TABLET | Freq: Two times a day (BID) | ORAL | Status: DC

## 2015-07-13 MED ORDER — METHYLPREDNISOLONE 4 MG PO TBPK: ORAL_TABLET | ORAL | Status: DC

## 2015-07-13 MED ORDER — SPIRONOLACTONE 25 MG PO TABS: 12.5000 mg | ORAL_TABLET | Freq: Every day | ORAL | Status: DC

## 2015-07-13 MED ORDER — ALBUTEROL SULFATE (2.5 MG/3ML) 0.083% IN NEBU: 2.5000 mg | INHALATION_SOLUTION | Freq: Four times a day (QID) | RESPIRATORY_TRACT | Status: DC | PRN

## 2015-07-13 NOTE — Progress Notes (Signed)
Subjective: She says she feels much better and wants to go home. She has no complaints. She is still coughing some. No chest pain. Cardiology help noted and appreciated  Objective: Vital signs in last 24 hours: Temp:  [97.5 F (36.4 C)-97.9 F (36.6 C)] 97.8 F (36.6 C) (03/01 0529) Pulse Rate:  [53-72] 53 (03/01 0529) Resp:  [18] 18 (03/01 0529) BP: (126-138)/(50-62) 138/62 mmHg (03/01 0529) SpO2:  [92 %-95 %] 95 % (03/01 0616) Weight:  [75.454 kg (166 lb 5.5 oz)-76.839 kg (169 lb 6.4 oz)] 76.839 kg (169 lb 6.4 oz) (03/01 0529) Weight change: -4.379 kg (-9 lb 10.5 oz) Last BM Date: 07/09/15  Intake/Output from previous day: 02/28 0701 - 03/01 0700 In: 120 [P.O.:120] Out: -   PHYSICAL EXAM General appearance: alert, cooperative and mild distress Resp: rhonchi bilaterally Cardio: regular rate and rhythm, S1, S2 normal, no murmur, click, rub or gallop GI: soft, non-tender; bowel sounds normal; no masses,  no organomegaly Extremities: extremities normal, atraumatic, no cyanosis or edema  Lab Results:  Results for orders placed or performed during the hospital encounter of 07/11/15 (from the past 48 hour(s))  Lipase, blood     Status: None   Collection Time: 07/11/15 11:17 AM  Result Value Ref Range   Lipase 35 11 - 51 U/L  Comprehensive metabolic panel     Status: Abnormal   Collection Time: 07/11/15 11:17 AM  Result Value Ref Range   Sodium 143 135 - 145 mmol/L   Potassium 4.2 3.5 - 5.1 mmol/L   Chloride 103 101 - 111 mmol/L   CO2 33 (H) 22 - 32 mmol/L   Glucose, Bld 110 (H) 65 - 99 mg/dL   BUN 18 6 - 20 mg/dL   Creatinine, Ser 0.78 0.44 - 1.00 mg/dL   Calcium 9.4 8.9 - 10.3 mg/dL   Total Protein 6.9 6.5 - 8.1 g/dL   Albumin 4.0 3.5 - 5.0 g/dL   AST 35 15 - 41 U/L   ALT 37 14 - 54 U/L   Alkaline Phosphatase 62 38 - 126 U/L   Total Bilirubin 0.5 0.3 - 1.2 mg/dL   GFR calc non Af Amer >60 >60 mL/min   GFR calc Af Amer >60 >60 mL/min    Comment: (NOTE) The eGFR has  been calculated using the CKD EPI equation. This calculation has not been validated in all clinical situations. eGFR's persistently <60 mL/min signify possible Chronic Kidney Disease.    Anion gap 7 5 - 15  CBC     Status: None   Collection Time: 07/11/15 11:17 AM  Result Value Ref Range   WBC 5.6 4.0 - 10.5 K/uL   RBC 4.98 3.87 - 5.11 MIL/uL   Hemoglobin 13.7 12.0 - 15.0 g/dL   HCT 42.3 36.0 - 46.0 %   MCV 84.9 78.0 - 100.0 fL   MCH 27.5 26.0 - 34.0 pg   MCHC 32.4 30.0 - 36.0 g/dL   RDW 13.6 11.5 - 15.5 %   Platelets 216 150 - 400 K/uL  Troponin I     Status: Abnormal   Collection Time: 07/11/15 11:17 AM  Result Value Ref Range   Troponin I 0.12 (H) <0.031 ng/mL    Comment:        PERSISTENTLY INCREASED TROPONIN VALUES IN THE RANGE OF 0.04-0.49 ng/mL CAN BE SEEN IN:       -UNSTABLE ANGINA       -CONGESTIVE HEART FAILURE       -MYOCARDITIS       -  CHEST TRAUMA       -ARRYHTHMIAS       -LATE PRESENTING MYOCARDIAL INFARCTION       -COPD   CLINICAL FOLLOW-UP RECOMMENDED.   Troponin I     Status: Abnormal   Collection Time: 07/11/15 12:57 PM  Result Value Ref Range   Troponin I 0.10 (H) <0.031 ng/mL    Comment:        PERSISTENTLY INCREASED TROPONIN VALUES IN THE RANGE OF 0.04-0.49 ng/mL CAN BE SEEN IN:       -UNSTABLE ANGINA       -CONGESTIVE HEART FAILURE       -MYOCARDITIS       -CHEST TRAUMA       -ARRYHTHMIAS       -LATE PRESENTING MYOCARDIAL INFARCTION       -COPD   CLINICAL FOLLOW-UP RECOMMENDED.   Influenza panel by PCR (type A & B, H1N1)     Status: None   Collection Time: 07/11/15  1:07 PM  Result Value Ref Range   Influenza A By PCR NEGATIVE NEGATIVE   Influenza B By PCR NEGATIVE NEGATIVE   H1N1 flu by pcr NOT DETECTED NOT DETECTED    Comment:        The Xpert Flu assay (FDA approved for nasal aspirates or washes and nasopharyngeal swab specimens), is intended as an aid in the diagnosis of influenza and should not be used as a sole basis for  treatment.   Troponin I (q 6hr x 3)     Status: Abnormal   Collection Time: 07/11/15  8:12 PM  Result Value Ref Range   Troponin I 0.07 (H) <0.031 ng/mL    Comment:        PERSISTENTLY INCREASED TROPONIN VALUES IN THE RANGE OF 0.04-0.49 ng/mL CAN BE SEEN IN:       -UNSTABLE ANGINA       -CONGESTIVE HEART FAILURE       -MYOCARDITIS       -CHEST TRAUMA       -ARRYHTHMIAS       -LATE PRESENTING MYOCARDIAL INFARCTION       -COPD   CLINICAL FOLLOW-UP RECOMMENDED.   Magnesium     Status: None   Collection Time: 07/11/15  8:12 PM  Result Value Ref Range   Magnesium 2.0 1.7 - 2.4 mg/dL  Phosphorus     Status: None   Collection Time: 07/11/15  8:12 PM  Result Value Ref Range   Phosphorus 2.8 2.5 - 4.6 mg/dL  Basic metabolic panel     Status: Abnormal   Collection Time: 07/12/15  1:59 AM  Result Value Ref Range   Sodium 136 135 - 145 mmol/L    Comment: DELTA CHECK NOTED   Potassium 4.1 3.5 - 5.1 mmol/L   Chloride 102 101 - 111 mmol/L   CO2 27 22 - 32 mmol/L   Glucose, Bld 139 (H) 65 - 99 mg/dL   BUN 16 6 - 20 mg/dL   Creatinine, Ser 0.54 0.44 - 1.00 mg/dL   Calcium 9.2 8.9 - 10.3 mg/dL   GFR calc non Af Amer >60 >60 mL/min   GFR calc Af Amer >60 >60 mL/min    Comment: (NOTE) The eGFR has been calculated using the CKD EPI equation. This calculation has not been validated in all clinical situations. eGFR's persistently <60 mL/min signify possible Chronic Kidney Disease.    Anion gap 7 5 - 15  CBC     Status: None  Collection Time: 07/12/15  1:59 AM  Result Value Ref Range   WBC 4.0 4.0 - 10.5 K/uL   RBC 4.74 3.87 - 5.11 MIL/uL   Hemoglobin 13.0 12.0 - 15.0 g/dL   HCT 39.5 36.0 - 46.0 %   MCV 83.3 78.0 - 100.0 fL   MCH 27.4 26.0 - 34.0 pg   MCHC 32.9 30.0 - 36.0 g/dL   RDW 13.4 11.5 - 15.5 %   Platelets 227 150 - 400 K/uL  Troponin I     Status: Abnormal   Collection Time: 07/12/15  1:59 AM  Result Value Ref Range   Troponin I 0.07 (H) <0.031 ng/mL    Comment:         PERSISTENTLY INCREASED TROPONIN VALUES IN THE RANGE OF 0.04-0.49 ng/mL CAN BE SEEN IN:       -UNSTABLE ANGINA       -CONGESTIVE HEART FAILURE       -MYOCARDITIS       -CHEST TRAUMA       -ARRYHTHMIAS       -LATE PRESENTING MYOCARDIAL INFARCTION       -COPD   CLINICAL FOLLOW-UP RECOMMENDED.   Troponin I     Status: Abnormal   Collection Time: 07/12/15  9:02 AM  Result Value Ref Range   Troponin I 0.08 (H) <0.031 ng/mL    Comment:        PERSISTENTLY INCREASED TROPONIN VALUES IN THE RANGE OF 0.04-0.49 ng/mL CAN BE SEEN IN:       -UNSTABLE ANGINA       -CONGESTIVE HEART FAILURE       -MYOCARDITIS       -CHEST TRAUMA       -ARRYHTHMIAS       -LATE PRESENTING MYOCARDIAL INFARCTION       -COPD   CLINICAL FOLLOW-UP RECOMMENDED.     ABGS No results for input(s): PHART, PO2ART, TCO2, HCO3 in the last 72 hours.  Invalid input(s): PCO2 CULTURES No results found for this or any previous visit (from the past 240 hour(s)). Studies/Results: Dg Chest 2 View  07/11/2015  CLINICAL DATA:  Coughing and sinus congestion starting 7 days ago, intermittent diarrhea, history hypertension EXAM: CHEST  2 VIEW COMPARISON:  01/27/2015 6 FINDINGS: Enlargement of cardiac silhouette. Mild tortuosity of thoracic aorta. Mediastinal contours and pulmonary vascularity normal. Lungs emphysematous but clear. No infiltrate, pleural effusion or pneumothorax. Diffuse osseous demineralization with degenerative disc disease changes thoracic spine. Prior cervical spine fusion and LEFT shoulder replacement. IMPRESSION: Enlargement of cardiac silhouette. COPD changes without acute infiltrate. Electronically Signed   By: Lavonia Dana M.D.   On: 07/11/2015 10:54    Medications:  Prior to Admission:  Prescriptions prior to admission  Medication Sig Dispense Refill Last Dose  . aspirin EC 81 MG tablet Take 81 mg by mouth every morning.    07/10/2015 at Unknown time  . carvedilol (COREG) 12.5 MG tablet Take 12.5 mg by  mouth 2 (two) times daily with a meal.   07/10/2015 at 0900  . clorazepate (TRANXENE) 7.5 MG tablet Take 7.5 mg by mouth daily as needed for anxiety. For nerves   Past Week at Unknown time  . lisinopril (PRINIVIL,ZESTRIL) 20 MG tablet TAKE ONE TABLET BY MOUTH DAILY. 90 tablet 2 07/10/2015 at Unknown time  . naproxen (NAPROSYN) 500 MG tablet Take 500 mg by mouth 2 (two) times daily with a meal.    07/10/2015 at Unknown time  . PROAIR HFA 108 (90 BASE) MCG/ACT  inhaler Inhale 2 puffs into the lungs every 6 (six) hours as needed for wheezing or shortness of breath.    07/10/2015 at Unknown time  . traMADol (ULTRAM) 50 MG tablet Take 50 mg by mouth daily as needed for moderate pain or severe pain. Maximum dose= 8 tablets per day   Past Week at Unknown time  . Umeclidinium-Vilanterol (ANORO ELLIPTA) 62.5-25 MCG/INH AEPB Inhale 1 puff into the lungs daily.    07/10/2015 at Unknown time   Scheduled: . aspirin EC  81 mg Oral Daily  . budesonide (PULMICORT) nebulizer solution  0.25 mg Nebulization BID  . carvedilol  12.5 mg Oral BID WC  . doxycycline  100 mg Oral Q12H  . feeding supplement (ENSURE ENLIVE)  237 mL Oral BID BM  . heparin  5,000 Units Subcutaneous 3 times per day  . levalbuterol  0.63 mg Nebulization Q8H  . lisinopril  20 mg Oral Daily  . pantoprazole  40 mg Oral Daily  . predniSONE  40 mg Oral Q breakfast  . sodium chloride flush  3 mL Intravenous Q12H  . spironolactone  12.5 mg Oral Daily  . tiotropium  18 mcg Inhalation Daily   Continuous:  [facility]:[id number] **OR** acetaminophen, benzonatate, clorazepate, gi cocktail, ondansetron **OR** ondansetron (ZOFRAN) IV, traMADol  Assesment: She was admitted with COPD exacerbation and had chest pain from that. Her troponin level was up and she is known to have a nonischemic cardiomyopathy per echocardiogram is unchanged. It is felt that her troponin elevation is related to demand ischemia and not a cardiac event. She is much improved and wants to  go home which I think is appropriate. She has had a diagnosis of protein calorie malnutrition which is severe made by the dietitian and I agree with that diagnosis Principal Problem:   Chest pain Active Problems:   Hypertension   COPD exacerbation (HCC)   Flu-like symptoms   Elevated troponin   Acute bronchitis   GERD (gastroesophageal reflux disease)   Bronchospasm   Nonischemic cardiomyopathy (HCC)   Atypical chest pain   Left bundle branch block   Protein-calorie malnutrition, severe    Plan: Discharge home today      [place] L 07/13/2015, 8:31 AM

## 2015-07-13 NOTE — Discharge Summary (Signed)
Physician Discharge Summary  Patient ID: [name] MRN: [id number] DOB/AGE: March 20, 1936 80 y.o. Primary Care Physician:Yuvonne Lanahan L, MD Admit date: 07/11/2015 Discharge date: 07/13/2015    Discharge Diagnoses:   Principal Problem:   Chest pain Active Problems:   Hypertension   COPD exacerbation (HCC)   Flu-like symptoms   Elevated troponin   Acute bronchitis   GERD (gastroesophageal reflux disease)   Bronchospasm   Nonischemic cardiomyopathy (HCC)   Atypical chest pain   Left bundle branch block   Protein-calorie malnutrition, severe   Demand ischemia of myocardium (HCC)     Medication List    TAKE these medications        ANORO ELLIPTA 62.5-25 MCG/INH Aepb  Generic drug:  umeclidinium-vilanterol  Inhale 1 puff into the lungs daily.     aspirin EC 81 MG tablet  Take 81 mg by mouth every morning.     benzonatate 200 MG capsule  Commonly known as:  TESSALON  Take 1 capsule (200 mg total) by mouth 3 (three) times daily as needed for cough.     carvedilol 12.5 MG tablet  Commonly known as:  COREG  Take 12.5 mg by mouth 2 (two) times daily with a meal.     clorazepate 7.5 MG tablet  Commonly known as:  TRANXENE  Take 7.5 mg by mouth daily as needed for anxiety. For nerves     doxycycline 100 MG tablet  Commonly known as:  VIBRA-TABS  Take 1 tablet (100 mg total) by mouth every 12 (twelve) hours.     lisinopril 20 MG tablet  Commonly known as:  PRINIVIL,ZESTRIL  TAKE ONE TABLET BY MOUTH DAILY.     methylPREDNISolone 4 MG Tbpk tablet  Commonly known as:  MEDROL  Take my package instructions     naproxen 500 MG tablet  Commonly known as:  NAPROSYN  Take 500 mg by mouth 2 (two) times daily with a meal.     PROAIR HFA 108 (90 Base) MCG/ACT inhaler  Generic drug:  albuterol  Inhale 2 puffs into the lungs every 6 (six) hours as needed for wheezing or shortness of breath.     albuterol (2.5 MG/3ML) 0.083% nebulizer solution  Commonly known as:   PROVENTIL  Take 3 mLs (2.5 mg total) by nebulization every 6 (six) hours as needed for wheezing or shortness of breath.     spironolactone 25 MG tablet  Commonly known as:  ALDACTONE  Take 0.5 tablets (12.5 mg total) by mouth daily.     traMADol 50 MG tablet  Commonly known as:  ULTRAM  Take 50 mg by mouth daily as needed for moderate pain or severe pain. Maximum dose= 8 tablets per day        Discharged Condition: Improved    Consults: Cardiology  Significant Diagnostic Studies: Dg Chest 2 View  07/11/2015  CLINICAL DATA:  Coughing and sinus congestion starting 7 days ago, intermittent diarrhea, history hypertension EXAM: CHEST  2 VIEW COMPARISON:  01/27/2015 6 FINDINGS: Enlargement of cardiac silhouette. Mild tortuosity of thoracic aorta. Mediastinal contours and pulmonary vascularity normal. Lungs emphysematous but clear. No infiltrate, pleural effusion or pneumothorax. Diffuse osseous demineralization with degenerative disc disease changes thoracic spine. Prior cervical spine fusion and LEFT shoulder replacement. IMPRESSION: Enlargement of cardiac silhouette. COPD changes without acute infiltrate. Electronically Signed   By: Lavonia Dana M.D.   On: 07/11/2015 10:54   Mr Jeri Cos [id number] Contrast  06/24/2015  CLINICAL DATA:  Abnormal head CT.  Dizziness.  EXAM: MRI HEAD WITHOUT AND WITH CONTRAST TECHNIQUE: Multiplanar, multiecho pulse sequences of the brain and surrounding structures were obtained without and with intravenous contrast. CONTRAST:  51mL MULTIHANCE GADOBENATE DIMEGLUMINE 529 MG/ML IV SOLN COMPARISON:  CT head 05/31/2015 FINDINGS: Ventricle size normal.  Cerebral volume normal for age Negative for acute infarct. Patchy white matter hyperintensities bilaterally consistent with mild to moderate chronic microvascular ischemia. Brainstem is normal. Cerebellar tonsils extend 8 mm below the foramen magnum suggesting mild Chiari malformation. Negative for intracranial hemorrhage Enhancing  extra-axial mass right occipital lobe measures 10 mm and corresponds to the high density lesion on CT. This is consistent with a meningioma. This is adjacent to the superior sagittal sinus which is patent. No edema in the adjacent brain. Small developmental venous anomaly right frontal lobe. No other enhancing mass lesion. Mild mucosal edema paranasal sinuses. Normal orbit. Pituitary not enlarged. IMPRESSION: 10 mm right occipital meningioma without brain edema Small developmental venous anomaly right frontal lobe Chronic microvascular ischemic changes in the white matter. No acute infarct Mild Chiari malformation. Electronically Signed   By: Franchot Gallo M.D.   On: 06/24/2015 15:48    Lab Results: Basic Metabolic Panel:  Recent Labs  07/11/15 1117 07/11/15 2012 07/12/15 0159  NA 143  --  136  K 4.2  --  4.1  CL 103  --  102  CO2 33*  --  27  GLUCOSE 110*  --  139*  BUN 18  --  16  CREATININE 0.78  --  0.54  CALCIUM 9.4  --  9.2  MG  --  2.0  --   PHOS  --  2.8  --    Liver Function Tests:  Recent Labs  07/11/15 1117  AST 35  ALT 37  ALKPHOS 62  BILITOT 0.5  PROT 6.9  ALBUMIN 4.0     CBC:  Recent Labs  07/11/15 1117 07/12/15 0159  WBC 5.6 4.0  HGB 13.7 13.0  HCT 42.3 39.5  MCV 84.9 83.3  PLT 216 227    No results found for this or any previous visit (from the past 240 hour(s)).   Hospital Course: This is a 80 year old who came to the emergency room with chest pain. She been sick for about 10 days with COPD exacerbation and was coughing up a lot of sputum. Her chest pain was mostly pleuritic. However she did have an elevated troponin. She was brought into the hospital started on antibiotics and steroids inhaled bronchodilators and had cardiology consultation. It was felt that her troponin elevation was related to demand ischemia not to a cardiac event. By her day of discharge she was back at baseline her breathing was much better she was coughing less and her chest  pain had resolved  Discharge Exam: Blood pressure 138/62, pulse 53, temperature 97.8 F (36.6 C), temperature source Oral, resp. rate 18, height 5\' 2"  (1.575 m), weight 76.839 kg (169 lb 6.4 oz), SpO2 95 %. She is awake and alert. Her chest is clear. Her heart is regular. Abdomen is soft  Disposition: Home she does not want home health services      Discharge Instructions    Discharge patient    Complete by:  As directed              Signed: Susanne Baumgarner L   07/13/2015, 8:41 AM

## 2015-07-13 NOTE — Progress Notes (Signed)
Pt discharged with prescriptions, IV removed and intact, with all belongings. Patient stable.

## 2015-08-26 ENCOUNTER — Ambulatory Visit: Payer: Medicare Other | Admitting: Cardiovascular Disease

## 2015-08-30 ENCOUNTER — Ambulatory Visit (INDEPENDENT_AMBULATORY_CARE_PROVIDER_SITE_OTHER): Payer: Medicare Other | Admitting: Cardiovascular Disease

## 2015-08-30 ENCOUNTER — Encounter: Payer: Self-pay | Admitting: Cardiovascular Disease

## 2015-08-30 VITALS — BP 116/58 | HR 64 | Ht 62.0 in | Wt 177.0 lb

## 2015-08-30 DIAGNOSIS — I10 Essential (primary) hypertension: Secondary | ICD-10-CM | POA: Diagnosis not present

## 2015-08-30 DIAGNOSIS — I447 Left bundle-branch block, unspecified: Secondary | ICD-10-CM | POA: Diagnosis not present

## 2015-08-30 DIAGNOSIS — Z9289 Personal history of other medical treatment: Secondary | ICD-10-CM

## 2015-08-30 DIAGNOSIS — R0789 Other chest pain: Secondary | ICD-10-CM

## 2015-08-30 DIAGNOSIS — I429 Cardiomyopathy, unspecified: Secondary | ICD-10-CM | POA: Diagnosis not present

## 2015-08-30 DIAGNOSIS — I712 Thoracic aortic aneurysm, without rupture, unspecified: Secondary | ICD-10-CM

## 2015-08-30 DIAGNOSIS — Z87898 Personal history of other specified conditions: Secondary | ICD-10-CM

## 2015-08-30 NOTE — Patient Instructions (Addendum)
   Referral to EP (electrophysiology) - evaluation for pacemaker / defibrillator.  Continue all current medications. Your physician wants you to follow up in:  1 year.  You will receive a reminder letter in the mail one-two months in advance.  If you don't receive a letter, please call our office to schedule the follow up appointment

## 2015-08-30 NOTE — Progress Notes (Signed)
Patient ID: [name], female   DOB: 1935/07/04, 80 y.o.   MRN: [id number]      SUBJECTIVE: The patient presents for routine follow-up. She was hospitalized for chest pain and discharged on March 1. Her pain was pleuritic and she had a COPD exacerbation. Echocardiogram showed stable LVEF of 35%, with mild mitral and aortic regurgitation with grade 1 diastolic dysfunction and diffuse hypokinesis. There was moderate LVH and a small circumferential pericardial effusion. Low-dose spironolactone was initiated but she is no longer on this.  She complains of chest pains when raking with 2 hands rather than 1. She has several questions about LBBB and wonders if she has "a blockage".  Review of Systems: As per "subjective", otherwise negative.  Allergies  Allergen Reactions  . Alprazolam Nausea And Vomiting  . Codeine Nausea And Vomiting  . Percodan [Oxycodone-Aspirin] Nausea And Vomiting  . Valium Nausea And Vomiting    Current Outpatient Prescriptions  Medication Sig Dispense Refill  . albuterol (PROVENTIL) (2.5 MG/3ML) 0.083% nebulizer solution Take 3 mLs (2.5 mg total) by nebulization every 6 (six) hours as needed for wheezing or shortness of breath. 75 mL 12  . aspirin EC 81 MG tablet Take 81 mg by mouth every morning.     . carvedilol (COREG) 6.25 MG tablet Take 6.25 mg by mouth 2 (two) times daily with a meal.    . clorazepate (TRANXENE) 7.5 MG tablet Take 7.5 mg by mouth daily as needed for anxiety. For nerves    . lisinopril (PRINIVIL,ZESTRIL) 20 MG tablet TAKE ONE TABLET BY MOUTH DAILY. 90 tablet 2  . meclizine (ANTIVERT) 25 MG tablet Take 25 mg by mouth 3 (three) times daily as needed for dizziness.    . naproxen (NAPROSYN) 500 MG tablet Take 500 mg by mouth 2 (two) times daily with a meal.     . PROAIR HFA 108 (90 BASE) MCG/ACT inhaler Inhale 2 puffs into the lungs every 6 (six) hours as needed for wheezing or shortness of breath.     . traMADol (ULTRAM) 50 MG tablet Take 50 mg by  mouth daily as needed for moderate pain or severe pain. Maximum dose= 8 tablets per day     No current facility-administered medications for this visit.    Past Medical History  Diagnosis Date  . Thoracic ascending aortic aneurysm (HCC)     4.3 cm April 2016  . Liver mass   . Lung, cysts, congenital     Left lung cyst  . Nephrolithiasis     Embedded  . Hemorrhoids   . Anxiety   . Essential hypertension   . COPD (chronic obstructive pulmonary disease) ([place])   . Nonischemic cardiomyopathy (HCC)     LVEF 35-40% 2015    Past Surgical History  Procedure Laterality Date  . Tonsillectomy and adenoidectomy    . Complete hysterectomy    . Benign breast tumors    . Cholecystectomy    . Colonoscopy    . Colonoscopy N/A 09/22/2014    Procedure: COLONOSCOPY;  Surgeon: Rogene Houston, MD;  [facility]: [facility];  Service: Endoscopy;  Laterality: N/A;  830 -- to be done in OR under fluoro    Social History   Social History  . Marital Status: Widowed    Spouse Name: N/A  . Number of Children: N/A  . Years of Education: 9th   Occupational History  .     Social History Main Topics  . Smoking status: Former Smoker --  0.25 packs/day for 31 years    Types: Cigarettes    Start date: 05/14/1977    Quit date: 05/28/2008  . Smokeless tobacco: Never Used  . Alcohol Use: No  . Drug Use: No  . Sexual Activity: No   Other Topics Concern  . Not on file   Social History Narrative     Filed Vitals:   08/30/15 1107  BP: 116/58  Pulse: 64  Height: 5\' 2"  (1.575 m)  Weight: 177 lb (80.287 kg)    PHYSICAL EXAM General: NAD HEENT: Normal. Neck: No JVD, no thyromegaly. Lungs: Clear to auscultation bilaterally with normal respiratory effort. CV: Nondisplaced PMI.  Regular rate and rhythm, normal S1/S2, no S3/S4, no murmur. No pretibial or periankle edema.     Abdomen: Soft, nontender, no distention.  Neurologic: Alert and oriented.  Psych: Normal affect. Skin:  Normal. Musculoskeletal: No gross deformities.  ECG: Most recent ECG reviewed.      ASSESSMENT AND PLAN: 1. Non-ischemic cardiomyopathy, EF 35%: She is compensated and euvolemic. Continue Coreg and ACEI. Given QRS dur 170 ms, will make referral to EP to see if she would benefit from cardiac resynchronization therapy and defibrillator.  2. Essential HTN: Controlled on lisinopril 20 mg daily and carvedilol 6.25 mg bid.   3. Thoracic aortic aneurysm: CT angiogram on 08/27/14 showed stability with no dissection, 4.3 cm. Can repeat in 6 months.  4. LBBB: Given QRS dur 170 ms, will make referral to EP to see if she would benefit from cardiac resynchronization therapy and defibrillator.  Dispo: f/u 1 year.  Kate Sable, M.D., F.A.C.C.

## 2015-09-22 ENCOUNTER — Ambulatory Visit (HOSPITAL_COMMUNITY)
Admission: RE | Admit: 2015-09-22 | Discharge: 2015-09-22 | Disposition: A | Discharge status: Home / Self Care | Payer: Medicare Other | Source: Ambulatory Visit | Attending: Pulmonary Disease | Admitting: Pulmonary Disease

## 2015-09-22 ENCOUNTER — Other Ambulatory Visit (HOSPITAL_COMMUNITY): Payer: Self-pay | Admitting: Pulmonary Disease

## 2015-09-22 DIAGNOSIS — I5022 Chronic systolic (congestive) heart failure: Secondary | ICD-10-CM | POA: Diagnosis not present

## 2015-09-22 DIAGNOSIS — M79662 Pain in left lower leg: Secondary | ICD-10-CM

## 2015-09-22 DIAGNOSIS — M79605 Pain in left leg: Secondary | ICD-10-CM | POA: Insufficient documentation

## 2015-09-22 DIAGNOSIS — J449 Chronic obstructive pulmonary disease, unspecified: Secondary | ICD-10-CM | POA: Diagnosis not present

## 2015-09-22 DIAGNOSIS — M7989 Other specified soft tissue disorders: Secondary | ICD-10-CM

## 2015-09-22 DIAGNOSIS — I10 Essential (primary) hypertension: Secondary | ICD-10-CM | POA: Diagnosis not present

## 2015-09-22 DIAGNOSIS — I420 Dilated cardiomyopathy: Secondary | ICD-10-CM | POA: Diagnosis not present

## 2015-10-03 ENCOUNTER — Ambulatory Visit (INDEPENDENT_AMBULATORY_CARE_PROVIDER_SITE_OTHER): Payer: Medicare Other | Admitting: Internal Medicine

## 2015-10-03 ENCOUNTER — Encounter: Payer: Self-pay | Admitting: Internal Medicine

## 2015-10-03 VITALS — BP 156/86 | HR 67 | Ht 62.0 in | Wt 175.8 lb

## 2015-10-03 DIAGNOSIS — I429 Cardiomyopathy, unspecified: Secondary | ICD-10-CM | POA: Diagnosis not present

## 2015-10-03 DIAGNOSIS — I447 Left bundle-branch block, unspecified: Secondary | ICD-10-CM | POA: Diagnosis not present

## 2015-10-03 DIAGNOSIS — I119 Hypertensive heart disease without heart failure: Secondary | ICD-10-CM | POA: Insufficient documentation

## 2015-10-03 DIAGNOSIS — I428 Other cardiomyopathies: Secondary | ICD-10-CM

## 2015-10-03 DIAGNOSIS — I110 Hypertensive heart disease with heart failure: Secondary | ICD-10-CM | POA: Diagnosis not present

## 2015-10-03 NOTE — Progress Notes (Signed)
Electrophysiology Office Note   Date:  10/03/2015   ID:  [name], DOB [birth year], MRN [id number]  PCP:  Alonza Bogus, MD  Cardiologist:  Dr Bronson Ing Primary Electrophysiologist: Thompson Grayer, MD    Chief Complaint  Patient presents with  . Cardiomyopathy     History of Present Illness: [name] is a 80 y.o. female who presents today for electrophysiology evaluation.   The patient has nonischemic CM, COPD, and thoracic aneurysm.  She was recently diagnosed with CHF/ COPD.  She feels that she has returned to her baseline health state.  She is referred today for consideration of resynchronization therapy.  She is primarily limited by arthritis.  She also has presumed COPD.   Today, she denies symptoms of palpitations, chest pain, orthopnea, PND, lower extremity edema, claudication, dizziness, presyncope, syncope, bleeding, or neurologic sequela. The patient is tolerating medications without difficulties and is otherwise without complaint today.    Past Medical History  Diagnosis Date  . Thoracic ascending aortic aneurysm (HCC)     4.3 cm April 2016  . Liver mass   . Lung, cysts, congenital     Left lung cyst  . Nephrolithiasis     Embedded  . Hemorrhoids   . Anxiety   . Essential hypertension   . COPD (chronic obstructive pulmonary disease) ([place])   . Nonischemic cardiomyopathy (HCC)     LVEF 35-40% 2015   Past Surgical History  Procedure Laterality Date  . Tonsillectomy and adenoidectomy    . Complete hysterectomy    . Benign breast tumors    . Cholecystectomy    . Colonoscopy    . Colonoscopy N/A 09/22/2014    Procedure: COLONOSCOPY;  Surgeon: Rogene Houston, MD;  [facility];  Service: Endoscopy;  Laterality: N/A;  830 -- to be done in OR under fluoro     Current Outpatient Prescriptions  Medication Sig Dispense Refill  . albuterol (PROVENTIL) (2.5 MG/3ML) 0.083% nebulizer solution Take 3 mLs (2.5 mg total) by nebulization every 6  (six) hours as needed for wheezing or shortness of breath. 75 mL 12  . aspirin EC 81 MG tablet Take 81 mg by mouth every morning.     . carvedilol (COREG) 6.25 MG tablet Take 6.25 mg by mouth 2 (two) times daily with a meal.    . clorazepate (TRANXENE) 7.5 MG tablet Take 7.5 mg by mouth daily as needed for anxiety. For nerves    . lisinopril (PRINIVIL,ZESTRIL) 20 MG tablet TAKE ONE TABLET BY MOUTH DAILY. 90 tablet 2  . meclizine (ANTIVERT) 25 MG tablet Take 25 mg by mouth 3 (three) times daily as needed for dizziness.    . naproxen (NAPROSYN) 500 MG tablet Take 500 mg by mouth 2 (two) times daily with a meal.     . PROAIR HFA 108 (90 BASE) MCG/ACT inhaler Inhale 2 puffs into the lungs every 6 (six) hours as needed for wheezing or shortness of breath.     . traMADol (ULTRAM) 50 MG tablet Take 50 mg by mouth daily as needed for moderate pain or severe pain. Maximum dose= 8 tablets per day     No current facility-administered medications for this visit.    Allergies:   Alprazolam; Codeine; Percodan; and Valium   Social History:  The patient  reports that she quit smoking about 7 years ago. Her smoking use included Cigarettes. She started smoking about 38 years ago. She has a 7.75 pack-year smoking history. She has  never used smokeless tobacco. She reports that she does not drink alcohol or use illicit drugs.   Family History:  The patient's  family history includes Aneurysm in her father; Diabetes in her brother; Heart disease in her brother, mother, and sister; Lung cancer in her brother.    ROS:  Please see the history of present illness.   All other systems are reviewed and negative.    PHYSICAL EXAM: VS:  BP 156/86 mmHg  Pulse 67  Ht 5\' 2"  (1.575 m)  Wt 175 lb 12.8 oz (79.742 kg)  BMI 32.15 kg/m2 , BMI Body mass index is 32.15 kg/(m^2). GEN: elderly, in no acute distress HEENT: normal Neck: no JVD, carotid bruits, or masses Cardiac: RRR; no murmurs, rubs, or gallops,no edema    Respiratory:  clear to auscultation bilaterally, normal work of breathing GI: soft, nontender, nondistended, + BS MS: no deformity or atrophy Skin: warm and dry  Neuro:  Strength and sensation are intact Psych: euthymic mood, full affect  EKG:  EKG is ordered today. The ekg ordered today shows sinus rhythm with LBBB (QRS 164 msec)   Recent Labs: 07/11/2015: ALT 37; Magnesium 2.0 07/12/2015: BUN 16; Creatinine, Ser 0.54; Hemoglobin 13.0; Platelets 227; Potassium 4.1; Sodium 136    Lipid Panel  No results found for: CHOL, TRIG, HDL, CHOLHDL, VLDL, LDLCALC, LDLDIRECT   Wt Readings from Last 3 Encounters:  10/03/15 175 lb 12.8 oz (79.742 kg)  08/30/15 177 lb (80.287 kg)  07/13/15 169 lb 6.4 oz (76.839 kg)      Other studies Reviewed: Additional studies/ records that were reviewed today include: Dr Court Joy notes, echo  Review of the above records today demonstrates: EF 35%   ASSESSMENT AND PLAN:  1.  Nonischemic CM/ nonischemic CM/ hypertensive cardiovascular disease The patient has a longstanding cardiomyopathy.  As her EF is not less than 35%, she does not meet criteria for ICD. Given her advanced age, I do not think that we should consider ICD long term. She does however have LBBB with QRS >150 msec.  I have therefore offered CRT-P (biV pacemaker) today.  She is clear that she is not interested in EP procedures currently and would prefer medical therapy.   Given elevated BP, I think that there may be more room for additional medical titration with either spironolactone or Entresto.  I will defer this decision to Dr Bronson Ing.  I will see as needed going forward.  Should she decide to consider biv pacemaker in the future, I would be happy to arrange.  Current medicines are reviewed at length with the patient today.   The patient does not have concerns regarding her medicines.  The following changes were made today:  none   Signed, Thompson Grayer, MD  10/03/2015 9:39 PM      [address] [place] [address] [phone] (office) [phone] (fax)

## 2015-10-03 NOTE — Patient Instructions (Signed)
Medication Instructions:  Your physician recommends that you continue on your current medications as directed. Please refer to the Current Medication list given to you today.  Labwork: None ordered.  Testing/Procedures: None ordered.  Follow-Up: Your physician recommends that you schedule a follow-up appointment as needed.   Any Other Special Instructions Will Be Listed Below (If Applicable).     If you need a refill on your cardiac medications before your next appointment, please call your pharmacy.   

## 2015-10-17 DIAGNOSIS — H903 Sensorineural hearing loss, bilateral: Secondary | ICD-10-CM | POA: Diagnosis not present

## 2015-12-21 ENCOUNTER — Other Ambulatory Visit: Payer: Self-pay | Admitting: Cardiovascular Disease

## 2015-12-29 DIAGNOSIS — M1990 Unspecified osteoarthritis, unspecified site: Secondary | ICD-10-CM | POA: Diagnosis not present

## 2015-12-29 DIAGNOSIS — J449 Chronic obstructive pulmonary disease, unspecified: Secondary | ICD-10-CM | POA: Diagnosis not present

## 2015-12-29 DIAGNOSIS — I10 Essential (primary) hypertension: Secondary | ICD-10-CM | POA: Diagnosis not present

## 2015-12-29 DIAGNOSIS — I5022 Chronic systolic (congestive) heart failure: Secondary | ICD-10-CM | POA: Diagnosis not present

## 2016-01-06 ENCOUNTER — Other Ambulatory Visit: Payer: Self-pay

## 2016-01-31 DIAGNOSIS — M19011 Primary osteoarthritis, right shoulder: Secondary | ICD-10-CM | POA: Diagnosis not present

## 2016-01-31 DIAGNOSIS — M7581 Other shoulder lesions, right shoulder: Secondary | ICD-10-CM | POA: Diagnosis not present

## 2016-02-28 DIAGNOSIS — Z23 Encounter for immunization: Secondary | ICD-10-CM | POA: Diagnosis not present

## 2016-02-28 DIAGNOSIS — Z0000 Encounter for general adult medical examination without abnormal findings: Secondary | ICD-10-CM | POA: Diagnosis not present

## 2016-02-29 DIAGNOSIS — J449 Chronic obstructive pulmonary disease, unspecified: Secondary | ICD-10-CM | POA: Diagnosis not present

## 2016-02-29 DIAGNOSIS — I420 Dilated cardiomyopathy: Secondary | ICD-10-CM | POA: Diagnosis not present

## 2016-02-29 DIAGNOSIS — I5020 Unspecified systolic (congestive) heart failure: Secondary | ICD-10-CM | POA: Diagnosis not present

## 2016-02-29 DIAGNOSIS — I10 Essential (primary) hypertension: Secondary | ICD-10-CM | POA: Diagnosis not present

## 2016-02-29 DIAGNOSIS — F419 Anxiety disorder, unspecified: Secondary | ICD-10-CM | POA: Diagnosis not present

## 2016-02-29 DIAGNOSIS — Z0000 Encounter for general adult medical examination without abnormal findings: Secondary | ICD-10-CM | POA: Diagnosis not present

## 2016-02-29 DIAGNOSIS — M1990 Unspecified osteoarthritis, unspecified site: Secondary | ICD-10-CM | POA: Diagnosis not present

## 2016-02-29 DIAGNOSIS — M5000 Cervical disc disorder with myelopathy, unspecified cervical region: Secondary | ICD-10-CM | POA: Diagnosis not present

## 2016-03-19 DIAGNOSIS — Z1211 Encounter for screening for malignant neoplasm of colon: Secondary | ICD-10-CM | POA: Diagnosis not present

## 2016-06-07 ENCOUNTER — Ambulatory Visit (HOSPITAL_COMMUNITY)
Admission: RE | Admit: 2016-06-07 | Discharge: 2016-06-07 | Disposition: A | Discharge status: Home / Self Care | Payer: Medicare Other | Source: Ambulatory Visit | Attending: Pulmonary Disease | Admitting: Pulmonary Disease

## 2016-06-07 ENCOUNTER — Other Ambulatory Visit (HOSPITAL_COMMUNITY): Payer: Self-pay | Admitting: Pulmonary Disease

## 2016-06-07 ENCOUNTER — Other Ambulatory Visit: Payer: Self-pay | Admitting: Cardiovascular Disease

## 2016-06-07 DIAGNOSIS — M47812 Spondylosis without myelopathy or radiculopathy, cervical region: Secondary | ICD-10-CM | POA: Insufficient documentation

## 2016-06-07 DIAGNOSIS — I10 Essential (primary) hypertension: Secondary | ICD-10-CM | POA: Diagnosis not present

## 2016-06-07 DIAGNOSIS — M542 Cervicalgia: Secondary | ICD-10-CM

## 2016-06-07 DIAGNOSIS — M1288 Other specific arthropathies, not elsewhere classified, other specified site: Secondary | ICD-10-CM | POA: Diagnosis not present

## 2016-06-07 DIAGNOSIS — M5000 Cervical disc disorder with myelopathy, unspecified cervical region: Secondary | ICD-10-CM | POA: Diagnosis not present

## 2016-06-07 DIAGNOSIS — J449 Chronic obstructive pulmonary disease, unspecified: Secondary | ICD-10-CM | POA: Diagnosis not present

## 2016-06-07 DIAGNOSIS — Z981 Arthrodesis status: Secondary | ICD-10-CM | POA: Insufficient documentation

## 2016-06-12 DIAGNOSIS — M7581 Other shoulder lesions, right shoulder: Secondary | ICD-10-CM | POA: Diagnosis not present

## 2016-06-12 DIAGNOSIS — M47812 Spondylosis without myelopathy or radiculopathy, cervical region: Secondary | ICD-10-CM | POA: Diagnosis not present

## 2016-06-12 DIAGNOSIS — M19011 Primary osteoarthritis, right shoulder: Secondary | ICD-10-CM | POA: Diagnosis not present

## 2016-08-29 ENCOUNTER — Encounter: Payer: Self-pay | Admitting: Cardiovascular Disease

## 2016-08-29 ENCOUNTER — Ambulatory Visit (INDEPENDENT_AMBULATORY_CARE_PROVIDER_SITE_OTHER): Payer: Medicare Other | Admitting: Cardiovascular Disease

## 2016-08-29 ENCOUNTER — Other Ambulatory Visit: Payer: Self-pay | Admitting: Cardiovascular Disease

## 2016-08-29 VITALS — BP 142/70 | HR 77 | Ht 62.0 in | Wt 170.0 lb

## 2016-08-29 DIAGNOSIS — I10 Essential (primary) hypertension: Secondary | ICD-10-CM | POA: Diagnosis not present

## 2016-08-29 DIAGNOSIS — I7121 Aneurysm of the ascending aorta, without rupture: Secondary | ICD-10-CM

## 2016-08-29 DIAGNOSIS — I5022 Chronic systolic (congestive) heart failure: Secondary | ICD-10-CM | POA: Diagnosis not present

## 2016-08-29 DIAGNOSIS — Z01812 Encounter for preprocedural laboratory examination: Secondary | ICD-10-CM | POA: Diagnosis not present

## 2016-08-29 DIAGNOSIS — I428 Other cardiomyopathies: Secondary | ICD-10-CM

## 2016-08-29 DIAGNOSIS — I447 Left bundle-branch block, unspecified: Secondary | ICD-10-CM

## 2016-08-29 DIAGNOSIS — I712 Thoracic aortic aneurysm, without rupture: Secondary | ICD-10-CM | POA: Diagnosis not present

## 2016-08-29 LAB — BASIC METABOLIC PANEL
BUN: 11 mg/dL (ref 7–25)
CO2: 28 mmol/L (ref 20–31)
Calcium: 10.2 mg/dL (ref 8.6–10.4)
Chloride: 104 mmol/L (ref 98–110)
Creat: 0.72 mg/dL (ref 0.60–0.93)
Glucose, Bld: 114 mg/dL — ABNORMAL HIGH (ref 65–99)
Potassium: 4.5 mmol/L (ref 3.5–5.3)
Sodium: 142 mmol/L (ref 135–146)

## 2016-08-29 MED ORDER — SACUBITRIL-VALSARTAN 24-26 MG PO TABS: 1.0000 | ORAL_TABLET | Freq: Two times a day (BID) | ORAL | 0 refills | Status: DC

## 2016-08-29 MED ORDER — SACUBITRIL-VALSARTAN 24-26 MG PO TABS: 1.0000 | ORAL_TABLET | Freq: Two times a day (BID) | ORAL | 6 refills | Status: DC

## 2016-08-29 NOTE — Patient Instructions (Signed)
Medication Instructions:   Stop Lisinopril.   Begin Entresto 24/26mg  twice a day  - may begin 1 1/2 days after stopping the Lisinopril.  Continue all other medications.    Labwork: BMET - order given today.  Testing/Procedures:  CT angio of the chest   Office will contact with results via phone or letter.    Follow-Up: Your physician wants you to follow up in:  1 year.  You will receive a reminder letter in the mail one-two months in advance.  If you don't receive a letter, please call our office to schedule the follow up appointment   Any Other Special Instructions Will Be Listed Below (If Applicable).  If you need a refill on your cardiac medications before your next appointment, please call your pharmacy.

## 2016-08-29 NOTE — Progress Notes (Signed)
SUBJECTIVE: The patient is a 81 year old woman with a history of a nonischemic cardiomyopathy, hypertension, thoracic aortic aneurysm, and left bundle branch block. I previously referred her to EP for biventricular pacemaker evaluation but she was not interested and preferred medical therapy.  She is doing fairly well overall. She said she is tired out and she took care of her 62-month-old great-granddaughter over the weekend. She has occasional exertional dyspnea and also has COPD. She denies leg swelling, orthopnea, and paroxysmal nocturnal dyspnea.  ECG performed in the office today which I ordered and personally interpreted demonstrates normal sinus rhythm with a LBBB, QRS dur 166 ms.  Review of Systems: As per "subjective", otherwise negative.  Allergies  Allergen Reactions  . Alprazolam Nausea And Vomiting  . Codeine Nausea And Vomiting  . Percodan [Oxycodone-Aspirin] Nausea And Vomiting  . Valium Nausea And Vomiting    Current Outpatient Prescriptions  Medication Sig Dispense Refill  . albuterol (PROVENTIL) (2.5 MG/3ML) 0.083% nebulizer solution Take 3 mLs (2.5 mg total) by nebulization every 6 (six) hours as needed for wheezing or shortness of breath. 75 mL 12  . aspirin EC 81 MG tablet Take 81 mg by mouth every morning.     . carvedilol (COREG) 12.5 MG tablet Take 12.5 mg by mouth 2 (two) times daily with a meal.    . clorazepate (TRANXENE) 7.5 MG tablet Take 7.5 mg by mouth daily as needed for anxiety. For nerves    . diclofenac (FLECTOR) 1.3 % PTCH Place 1 patch onto the skin 2 (two) times daily.    Marland Kitchen lisinopril (PRINIVIL,ZESTRIL) 10 MG tablet Take 10 mg by mouth daily.    . meclizine (ANTIVERT) 25 MG tablet Take 25 mg by mouth 3 (three) times daily as needed for dizziness.    . naproxen (NAPROSYN) 500 MG tablet Take 500 mg by mouth 2 (two) times daily with a meal.     . PROAIR HFA 108 (90 BASE) MCG/ACT inhaler Inhale 2 puffs into the lungs every 6 (six) hours as needed  for wheezing or shortness of breath.     . tiotropium (SPIRIVA) 18 MCG inhalation capsule Place 18 mcg into inhaler and inhale daily.    . traMADol (ULTRAM) 50 MG tablet Take 50 mg by mouth daily as needed for moderate pain or severe pain. Maximum dose= 8 tablets per day    . umeclidinium bromide (INCRUSE ELLIPTA) 62.5 MCG/INH AEPB Inhale 1 puff into the lungs daily.     No current facility-administered medications for this visit.     Past Medical History:  Diagnosis Date  . Anxiety   . Cervical disc disorder with myelopathy, unspecified cervical region   . Chronic systolic (congestive) heart failure ([place])   . COPD (chronic obstructive pulmonary disease) ([place])   . Essential hypertension   . Hemorrhoids   . Liver mass   . Lung, cysts, congenital    Left lung cyst  . Nephrolithiasis    Embedded  . Nonischemic cardiomyopathy ([place])    LVEF 35-40% 2015  . Osteoarthritis   . Thoracic ascending aortic aneurysm (HCC)    4.3 cm April 2016    Past Surgical History:  Procedure Laterality Date  . Benign breast tumors    . CHOLECYSTECTOMY    . COLONOSCOPY    . COLONOSCOPY N/A 09/22/2014   Procedure: COLONOSCOPY;  Surgeon: Rogene Houston, MD;  [facility];  Service: Endoscopy;  Laterality: N/A;  830 -- to be done in  OR under fluoro  . Complete hysterectomy    . TONSILLECTOMY AND ADENOIDECTOMY      Social History   Social History  . Marital status: Widowed    Spouse name: N/A  . Number of children: N/A  . Years of education: 9th   Occupational History  .  Retired   Social History Main Topics  . Smoking status: Former Smoker    Packs/day: 0.25    Years: 31.00    Types: Cigarettes    Start date: 05/14/1977    Quit date: 05/28/2008  . Smokeless tobacco: Never Used  . Alcohol use No  . Drug use: No  . Sexual activity: No   Other Topics Concern  . Not on file   Social History Narrative  . No narrative on file     Vitals:   08/29/16 1044  BP: (!) 142/70    Pulse: 77  SpO2: 96%  Weight: 170 lb (77.1 kg)  Height: 5\' 2"  (1.575 m)    Wt Readings from Last 3 Encounters:  08/29/16 170 lb (77.1 kg)  10/03/15 175 lb 12.8 oz (79.7 kg)  08/30/15 177 lb (80.3 kg)     PHYSICAL EXAM General: NAD HEENT: Normal. Neck: No JVD, no thyromegaly. Lungs: Clear to auscultation bilaterally with normal respiratory effort. CV: Nondisplaced PMI.  Regular rate and rhythm, normal S1/S2, no S3/S4, no murmur. No pretibial or periankle edema.  No carotid bruit.   Abdomen: Soft, nontender, no distention.  Neurologic: Alert and oriented.  Psych: Normal affect. Skin: Normal. Musculoskeletal: No gross deformities.    ECG: Most recent ECG reviewed.   Labs: Lab Results  Component Value Date/Time   K 4.1 07/12/2015 01:59 AM   BUN 16 07/12/2015 01:59 AM   CREATININE 0.54 07/12/2015 01:59 AM   ALT 37 07/11/2015 11:17 AM   HGB 13.0 07/12/2015 01:59 AM     Lipids: No results found for: LDLCALC, LDLDIRECT, CHOL, TRIG, HDL     ASSESSMENT AND PLAN: 1. Non-ischemic cardiomyopathy/chronic systolic heart failure, EF 35%: She is compensated and euvolemic and does not require diuretics. I will continue carvedilol but stop lisinopril and start Entresto 24/26 mg twice daily. She previously declined cardiac recent conization therapy.  2. Essential HTN: Mildly elevated. Monitor given initiation of Entresto and discontinuation of lisinopril.  3. Thoracic aortic aneurysm: CT angiogram on 08/27/14 showed stability with no dissection, 4.3 cm. I will repeat.  4. LBBB: QRS dur 166 ms. She previously declined cardiac resynchronization therapy.    Disposition: Follow up 1 yr  Kate Sable, M.D., F.A.C.C.

## 2016-09-03 ENCOUNTER — Ambulatory Visit (HOSPITAL_COMMUNITY)
Admission: RE | Admit: 2016-09-03 | Discharge: 2016-09-03 | Disposition: A | Discharge status: Home / Self Care | Payer: Medicare Other | Source: Ambulatory Visit | Attending: Cardiovascular Disease | Admitting: Cardiovascular Disease

## 2016-09-03 DIAGNOSIS — I712 Thoracic aortic aneurysm, without rupture: Secondary | ICD-10-CM | POA: Insufficient documentation

## 2016-09-03 DIAGNOSIS — I2510 Atherosclerotic heart disease of native coronary artery without angina pectoris: Secondary | ICD-10-CM | POA: Insufficient documentation

## 2016-09-03 DIAGNOSIS — I700 Atherosclerosis of aorta: Secondary | ICD-10-CM | POA: Insufficient documentation

## 2016-09-03 DIAGNOSIS — I517 Cardiomegaly: Secondary | ICD-10-CM | POA: Diagnosis not present

## 2016-09-03 DIAGNOSIS — R079 Chest pain, unspecified: Secondary | ICD-10-CM | POA: Diagnosis not present

## 2016-09-03 DIAGNOSIS — I7121 Aneurysm of the ascending aorta, without rupture: Secondary | ICD-10-CM

## 2016-09-03 MED ORDER — IOPAMIDOL (ISOVUE-370) INJECTION 76%
100.0000 mL | Freq: Once | INTRAVENOUS | Status: AC | PRN
  Administered 2016-09-03: 100 mL via INTRAVENOUS

## 2016-09-04 ENCOUNTER — Telehealth: Payer: Self-pay | Admitting: *Deleted

## 2016-09-04 NOTE — Telephone Encounter (Signed)
BMET -   Notes recorded by Herminio Commons, MD on 08/30/2016 at 12:48 PM EDT Good.  CTA aorta -   Notes recorded by Herminio Commons, MD on 09/03/2016 at 2:27 PM EDT Mild aortic aneurysm, unchanged from 2015. Can repeat in 1 year.

## 2016-09-04 NOTE — Telephone Encounter (Signed)
Notes recorded by Laurine Blazer, LPN on 2/58/9483 at 4:75 PM EDT Patient notified. Copy to pmd.

## 2016-09-20 ENCOUNTER — Telehealth: Payer: Self-pay | Admitting: *Deleted

## 2016-09-20 DIAGNOSIS — J449 Chronic obstructive pulmonary disease, unspecified: Secondary | ICD-10-CM | POA: Diagnosis not present

## 2016-09-20 DIAGNOSIS — I5022 Chronic systolic (congestive) heart failure: Secondary | ICD-10-CM | POA: Diagnosis not present

## 2016-09-20 DIAGNOSIS — I420 Dilated cardiomyopathy: Secondary | ICD-10-CM | POA: Diagnosis not present

## 2016-09-20 DIAGNOSIS — I10 Essential (primary) hypertension: Secondary | ICD-10-CM | POA: Diagnosis not present

## 2016-09-20 NOTE — Telephone Encounter (Signed)
Pt says she couldn't tolerate taking Delene Loll says "made my heart act crazy" so pt started only taking 1 tablet daily since 09/05/16. Says she has been feeling fine since decrease. Pt wanted to make Dr Bronson Ing aware

## 2016-10-31 ENCOUNTER — Encounter (INDEPENDENT_AMBULATORY_CARE_PROVIDER_SITE_OTHER): Payer: Self-pay | Admitting: Internal Medicine

## 2016-10-31 ENCOUNTER — Encounter (INDEPENDENT_AMBULATORY_CARE_PROVIDER_SITE_OTHER): Payer: Self-pay

## 2016-10-31 ENCOUNTER — Ambulatory Visit (INDEPENDENT_AMBULATORY_CARE_PROVIDER_SITE_OTHER): Payer: Medicare Other | Admitting: Internal Medicine

## 2016-10-31 VITALS — BP 140/66 | HR 60 | Temp 97.6°F | Ht 62.0 in | Wt 170.7 lb

## 2016-10-31 DIAGNOSIS — R1030 Lower abdominal pain, unspecified: Secondary | ICD-10-CM | POA: Diagnosis not present

## 2016-10-31 DIAGNOSIS — K769 Liver disease, unspecified: Secondary | ICD-10-CM

## 2016-10-31 LAB — CBC WITH DIFFERENTIAL/PLATELET
Basophils Absolute: 0 cells/uL (ref 0–200)
Basophils Relative: 0 %
Eosinophils Absolute: 420 cells/uL (ref 15–500)
Eosinophils Relative: 6 %
HCT: 39.3 % (ref 35.0–45.0)
Hemoglobin: 12.8 g/dL (ref 11.7–15.5)
Lymphocytes Relative: 30 %
Lymphs Abs: 2100 cells/uL (ref 850–3900)
MCH: 26.9 pg — ABNORMAL LOW (ref 27.0–33.0)
MCHC: 32.6 g/dL (ref 32.0–36.0)
MCV: 82.6 fL (ref 80.0–100.0)
MPV: 9.3 fL (ref 7.5–12.5)
Monocytes Absolute: 630 cells/uL (ref 200–950)
Monocytes Relative: 9 %
Neutro Abs: 3850 cells/uL (ref 1500–7800)
Neutrophils Relative %: 55 %
Platelets: 304 10*3/uL (ref 140–400)
RBC: 4.76 MIL/uL (ref 3.80–5.10)
RDW: 14.2 % (ref 11.0–15.0)
WBC: 7 10*3/uL (ref 3.8–10.8)

## 2016-10-31 LAB — HEPATIC FUNCTION PANEL
ALT: 6 U/L (ref 6–29)
AST: 12 U/L (ref 10–35)
Albumin: 3.7 g/dL (ref 3.6–5.1)
Alkaline Phosphatase: 58 U/L (ref 33–130)
Bilirubin, Direct: 0.1 mg/dL (ref <=0.2)
Indirect Bilirubin: 0.3 mg/dL (ref 0.2–1.2)
Total Bilirubin: 0.4 mg/dL (ref 0.2–1.2)
Total Protein: 6.1 g/dL (ref 6.1–8.1)

## 2016-10-31 NOTE — Progress Notes (Signed)
Subjective:    Patient ID: [name], female    DOB: 07-16-1935, 81 y.o.   MRN: [id number]  HPI Presents today with c/o epigastric pain x 2 weeks.  She has soreness in her abdomen. She says her BMs are small and maybe the next she will have a large BM.   Her last BM was this morning. She c/o lower abdominal pain. Says she just has a dull ache.  She was seen in 2013 with this same type pain.  Her appetite is good.  Has lost about 16 pounds but has gained most of it back.  Usually has a BM daily.     09/22/2014 Colonoscopy: hx of colon polyps and family hx of colon cancer in sister and nephew  Prep excellent.  incomplete exam the distal transverse colon secondary to angulated loop and sigmoid colon which could never be reduced. Scattered diverticula at sigmoid colon. Normal rectal mucosa. Small hemorrhoids below the dentate line.  10/27/2014 Virtual Colonoscopy:   IMPRESSION: 1. No clinically significant colonic polyp or mass identified. The distal descending and sigmoid colon is suboptimally evaluated by virtual colonoscopy today, but this region was visualized on the recent optical colonoscopy. 2. Posterior right hepatic cavernous hemangioma. 3. Left renal cysts.   Review of Systems Past Medical History:  Diagnosis Date  . Anxiety   . Cervical disc disorder with myelopathy, unspecified cervical region   . Chronic systolic (congestive) heart failure ([place])   . COPD (chronic obstructive pulmonary disease) ([place])   . Essential hypertension   . Hemorrhoids   . Liver mass   . Lung, cysts, congenital    Left lung cyst  . Nephrolithiasis    Embedded  . Nonischemic cardiomyopathy ([place])    LVEF 35-40% 2015  . Osteoarthritis   . Thoracic ascending aortic aneurysm (HCC)    4.3 cm April 2016    Past Surgical History:  Procedure Laterality Date  . Benign breast tumors    . CHOLECYSTECTOMY    . COLONOSCOPY    . COLONOSCOPY N/A 09/22/2014   Procedure: COLONOSCOPY;   Surgeon: Rogene Houston, MD;  Location: [name];  Service: Endoscopy;  Laterality: N/A;  830 -- to be done in OR under fluoro  . Complete hysterectomy    . TONSILLECTOMY AND ADENOIDECTOMY      Allergies  Allergen Reactions  . Alprazolam Nausea And Vomiting  . Codeine Nausea And Vomiting  . Percodan [Oxycodone-Aspirin] Nausea And Vomiting  . Valium Nausea And Vomiting    Current Outpatient Prescriptions on File Prior to Visit  Medication Sig Dispense Refill  . albuterol (PROVENTIL) (2.5 MG/3ML) 0.083% nebulizer solution Take 3 mLs (2.5 mg total) by nebulization every 6 (six) hours as needed for wheezing or shortness of breath. 75 mL 12  . aspirin EC 81 MG tablet Take 81 mg by mouth every morning.     . carvedilol (COREG) 12.5 MG tablet Take 12.5 mg by mouth 2 (two) times daily with a meal.    . clorazepate (TRANXENE) 7.5 MG tablet Take 7.5 mg by mouth daily as needed for anxiety. For nerves    . naproxen (NAPROSYN) 500 MG tablet Take 500 mg by mouth 2 (two) times daily with a meal.     . PROAIR HFA 108 (90 BASE) MCG/ACT inhaler Inhale 2 puffs into the lungs every 6 (six) hours as needed for wheezing or shortness of breath.     . sacubitril-valsartan (ENTRESTO) 24-26 MG Take 1 tablet by mouth  2 (two) times daily. 60 tablet 6  . traMADol (ULTRAM) 50 MG tablet Take 50 mg by mouth daily as needed for moderate pain or severe pain. Maximum dose= 8 tablets per day     No current facility-administered medications on file prior to visit.         Objective:   Physical Exam Blood pressure 140/66, pulse 60, temperature 97.6 F (36.4 C), height 5\' 2"  (1.575 m), weight 170 lb 11.2 oz (77.4 kg). Alert and oriented. Skin warm and dry. Oral mucosa is moist.   . Sclera anicteric, conjunctivae is pink. Thyroid not enlarged. No cervical lymphadenopathy. Lungs clear. Heart regular rate and rhythm.  Abdomen is soft. Bowel sounds are positive. No hepatomegaly. No abdominal masses felt. No tenderness.   No edema to lower extremities.           Assessment & Plan:  Chronic lower abdominal pain, Liver lesion:  Will get an US abdomen. CBC and Hepatic function.

## 2016-10-31 NOTE — Patient Instructions (Signed)
US abdomen 

## 2016-11-08 ENCOUNTER — Ambulatory Visit (HOSPITAL_COMMUNITY)
Admission: RE | Admit: 2016-11-08 | Discharge: 2016-11-08 | Disposition: A | Discharge status: Home / Self Care | Payer: Medicare Other | Source: Ambulatory Visit | Attending: Internal Medicine | Admitting: Internal Medicine

## 2016-11-08 DIAGNOSIS — R1030 Lower abdominal pain, unspecified: Secondary | ICD-10-CM | POA: Diagnosis not present

## 2016-11-08 DIAGNOSIS — N281 Cyst of kidney, acquired: Secondary | ICD-10-CM | POA: Insufficient documentation

## 2016-11-08 DIAGNOSIS — D1803 Hemangioma of intra-abdominal structures: Secondary | ICD-10-CM | POA: Insufficient documentation

## 2016-11-08 DIAGNOSIS — G8929 Other chronic pain: Secondary | ICD-10-CM | POA: Diagnosis not present

## 2016-11-21 DIAGNOSIS — F419 Anxiety disorder, unspecified: Secondary | ICD-10-CM | POA: Diagnosis not present

## 2016-11-21 DIAGNOSIS — J441 Chronic obstructive pulmonary disease with (acute) exacerbation: Secondary | ICD-10-CM | POA: Diagnosis not present

## 2016-11-21 DIAGNOSIS — I10 Essential (primary) hypertension: Secondary | ICD-10-CM | POA: Diagnosis not present

## 2016-11-21 DIAGNOSIS — I5022 Chronic systolic (congestive) heart failure: Secondary | ICD-10-CM | POA: Diagnosis not present

## 2016-11-22 ENCOUNTER — Emergency Department (HOSPITAL_COMMUNITY): Payer: Medicare Other

## 2016-11-22 ENCOUNTER — Observation Stay (HOSPITAL_COMMUNITY): Payer: Medicare Other

## 2016-11-22 ENCOUNTER — Inpatient Hospital Stay (HOSPITAL_COMMUNITY)
Admission: EM | Admit: 2016-11-22 | Discharge: 2016-11-24 | DRG: 246 | Disposition: A | Discharge status: Home / Self Care | Payer: Medicare Other | Attending: Cardiology | Admitting: Cardiology

## 2016-11-22 ENCOUNTER — Encounter (HOSPITAL_COMMUNITY): Payer: Self-pay | Admitting: Cardiology

## 2016-11-22 DIAGNOSIS — I214 Non-ST elevation (NSTEMI) myocardial infarction: Principal | ICD-10-CM

## 2016-11-22 DIAGNOSIS — I5042 Chronic combined systolic (congestive) and diastolic (congestive) heart failure: Secondary | ICD-10-CM | POA: Diagnosis not present

## 2016-11-22 DIAGNOSIS — Z79899 Other long term (current) drug therapy: Secondary | ICD-10-CM

## 2016-11-22 DIAGNOSIS — M5000 Cervical disc disorder with myelopathy, unspecified cervical region: Secondary | ICD-10-CM | POA: Diagnosis present

## 2016-11-22 DIAGNOSIS — Z955 Presence of coronary angioplasty implant and graft: Secondary | ICD-10-CM

## 2016-11-22 DIAGNOSIS — Q446 Cystic disease of liver: Secondary | ICD-10-CM | POA: Diagnosis not present

## 2016-11-22 DIAGNOSIS — I10 Essential (primary) hypertension: Secondary | ICD-10-CM | POA: Diagnosis present

## 2016-11-22 DIAGNOSIS — R748 Abnormal levels of other serum enzymes: Secondary | ICD-10-CM | POA: Diagnosis not present

## 2016-11-22 DIAGNOSIS — I428 Other cardiomyopathies: Secondary | ICD-10-CM | POA: Diagnosis not present

## 2016-11-22 DIAGNOSIS — I313 Pericardial effusion (noninflammatory): Secondary | ICD-10-CM | POA: Diagnosis not present

## 2016-11-22 DIAGNOSIS — I447 Left bundle-branch block, unspecified: Secondary | ICD-10-CM

## 2016-11-22 DIAGNOSIS — M1990 Unspecified osteoarthritis, unspecified site: Secondary | ICD-10-CM | POA: Diagnosis present

## 2016-11-22 DIAGNOSIS — R0789 Other chest pain: Secondary | ICD-10-CM

## 2016-11-22 DIAGNOSIS — F419 Anxiety disorder, unspecified: Secondary | ICD-10-CM | POA: Diagnosis present

## 2016-11-22 DIAGNOSIS — J209 Acute bronchitis, unspecified: Secondary | ICD-10-CM

## 2016-11-22 DIAGNOSIS — R7989 Other specified abnormal findings of blood chemistry: Secondary | ICD-10-CM | POA: Diagnosis present

## 2016-11-22 DIAGNOSIS — K7689 Other specified diseases of liver: Secondary | ICD-10-CM | POA: Diagnosis not present

## 2016-11-22 DIAGNOSIS — G8929 Other chronic pain: Secondary | ICD-10-CM | POA: Diagnosis present

## 2016-11-22 DIAGNOSIS — Z87891 Personal history of nicotine dependence: Secondary | ICD-10-CM

## 2016-11-22 DIAGNOSIS — J9601 Acute respiratory failure with hypoxia: Secondary | ICD-10-CM

## 2016-11-22 DIAGNOSIS — Z888 Allergy status to other drugs, medicaments and biological substances status: Secondary | ICD-10-CM

## 2016-11-22 DIAGNOSIS — J441 Chronic obstructive pulmonary disease with (acute) exacerbation: Secondary | ICD-10-CM

## 2016-11-22 DIAGNOSIS — R069 Unspecified abnormalities of breathing: Secondary | ICD-10-CM | POA: Diagnosis not present

## 2016-11-22 DIAGNOSIS — R778 Other specified abnormalities of plasma proteins: Secondary | ICD-10-CM | POA: Diagnosis present

## 2016-11-22 DIAGNOSIS — R079 Chest pain, unspecified: Secondary | ICD-10-CM | POA: Diagnosis not present

## 2016-11-22 DIAGNOSIS — Z791 Long term (current) use of non-steroidal anti-inflammatories (NSAID): Secondary | ICD-10-CM

## 2016-11-22 DIAGNOSIS — I7121 Aneurysm of the ascending aorta, without rupture: Secondary | ICD-10-CM | POA: Diagnosis present

## 2016-11-22 DIAGNOSIS — I080 Rheumatic disorders of both mitral and aortic valves: Secondary | ICD-10-CM | POA: Diagnosis present

## 2016-11-22 DIAGNOSIS — R0602 Shortness of breath: Secondary | ICD-10-CM | POA: Diagnosis not present

## 2016-11-22 DIAGNOSIS — I712 Thoracic aortic aneurysm, without rupture: Secondary | ICD-10-CM | POA: Diagnosis not present

## 2016-11-22 DIAGNOSIS — I110 Hypertensive heart disease with heart failure: Secondary | ICD-10-CM | POA: Diagnosis not present

## 2016-11-22 DIAGNOSIS — I2510 Atherosclerotic heart disease of native coronary artery without angina pectoris: Secondary | ICD-10-CM | POA: Diagnosis present

## 2016-11-22 DIAGNOSIS — Z885 Allergy status to narcotic agent status: Secondary | ICD-10-CM

## 2016-11-22 DIAGNOSIS — R05 Cough: Secondary | ICD-10-CM | POA: Diagnosis not present

## 2016-11-22 DIAGNOSIS — Z7982 Long term (current) use of aspirin: Secondary | ICD-10-CM

## 2016-11-22 LAB — CBC
HCT: 41.8 % (ref 36.0–46.0)
Hemoglobin: 13.8 g/dL (ref 12.0–15.0)
MCH: 27.3 pg (ref 26.0–34.0)
MCHC: 33 g/dL (ref 30.0–36.0)
MCV: 82.8 fL (ref 78.0–100.0)
Platelets: 269 10*3/uL (ref 150–400)
RBC: 5.05 MIL/uL (ref 3.87–5.11)
RDW: 13.9 % (ref 11.5–15.5)
WBC: 9.6 10*3/uL (ref 4.0–10.5)

## 2016-11-22 LAB — TROPONIN I
Troponin I: 0.06 ng/mL (ref <0.03)
Troponin I: 3.94 ng/mL (ref <0.03)

## 2016-11-22 LAB — BASIC METABOLIC PANEL
Anion gap: 8 (ref 5–15)
BUN: 10 mg/dL (ref 6–20)
CO2: 29 mmol/L (ref 22–32)
Calcium: 10 mg/dL (ref 8.9–10.3)
Chloride: 100 mmol/L — ABNORMAL LOW (ref 101–111)
Creatinine, Ser: 0.78 mg/dL (ref 0.44–1.00)
GFR calc Af Amer: >60 mL/min (ref >60)
GFR calc non Af Amer: >60 mL/min (ref >60)
Glucose, Bld: 156 mg/dL — ABNORMAL HIGH (ref 65–99)
Potassium: 3.9 mmol/L (ref 3.5–5.1)
Sodium: 137 mmol/L (ref 135–145)

## 2016-11-22 LAB — CBG MONITORING, ED
Glucose-Capillary: 153 mg/dL — ABNORMAL HIGH (ref 65–99)
Glucose-Capillary: 155 mg/dL — ABNORMAL HIGH (ref 65–99)

## 2016-11-22 MED ORDER — POLYETHYLENE GLYCOL 3350 17 G PO PACK: 17.0000 g | PACK | Freq: Every day | ORAL | Status: DC | PRN

## 2016-11-22 MED ORDER — ALBUTEROL SULFATE (2.5 MG/3ML) 0.083% IN NEBU
2.5000 mg | INHALATION_SOLUTION | Freq: Four times a day (QID) | RESPIRATORY_TRACT | Status: DC | PRN
  Administered 2016-11-24: 2.5 mg via RESPIRATORY_TRACT
  Filled 2016-11-22 (×2): qty 3

## 2016-11-22 MED ORDER — METHYLPREDNISOLONE SODIUM SUCC 40 MG IJ SOLR
40.0000 mg | Freq: Two times a day (BID) | INTRAMUSCULAR | Status: DC
  Administered 2016-11-23 – 2016-11-24 (×3): 40 mg via INTRAVENOUS
  Filled 2016-11-22 (×3): qty 1

## 2016-11-22 MED ORDER — ONDANSETRON HCL 4 MG PO TABS: 4.0000 mg | ORAL_TABLET | Freq: Four times a day (QID) | ORAL | Status: DC | PRN

## 2016-11-22 MED ORDER — TRAMADOL HCL 50 MG PO TABS: 50.0000 mg | ORAL_TABLET | Freq: Every day | ORAL | Status: DC | PRN

## 2016-11-22 MED ORDER — GUAIFENESIN ER 600 MG PO TB12
600.0000 mg | ORAL_TABLET | Freq: Two times a day (BID) | ORAL | Status: DC
  Administered 2016-11-22 – 2016-11-24 (×4): 600 mg via ORAL
  Filled 2016-11-22 (×4): qty 1

## 2016-11-22 MED ORDER — SACUBITRIL-VALSARTAN 24-26 MG PO TABS
1.0000 | ORAL_TABLET | Freq: Two times a day (BID) | ORAL | Status: DC
  Administered 2016-11-22 – 2016-11-24 (×3): 1 via ORAL
  Filled 2016-11-22 (×10): qty 1

## 2016-11-22 MED ORDER — ALBUTEROL (5 MG/ML) CONTINUOUS INHALATION SOLN
5.0000 mg/h | INHALATION_SOLUTION | Freq: Once | RESPIRATORY_TRACT | Status: AC
  Administered 2016-11-22: 5 mg/h via RESPIRATORY_TRACT
  Filled 2016-11-22: qty 20

## 2016-11-22 MED ORDER — ASPIRIN 325 MG PO TABS
325.0000 mg | ORAL_TABLET | Freq: Every day | ORAL | Status: DC
Start: 2016-11-22 — End: 2016-11-23

## 2016-11-22 MED ORDER — METHYLPREDNISOLONE SODIUM SUCC 125 MG IJ SOLR
125.0000 mg | Freq: Once | INTRAMUSCULAR | Status: AC
  Administered 2016-11-22: 125 mg via INTRAVENOUS
  Filled 2016-11-22: qty 2

## 2016-11-22 MED ORDER — ONDANSETRON HCL 4 MG/2ML IJ SOLN
4.0000 mg | Freq: Four times a day (QID) | INTRAMUSCULAR | Status: DC | PRN
Start: 2016-11-22 — End: 2016-11-23

## 2016-11-22 MED ORDER — ASPIRIN EC 81 MG PO TBEC
81.0000 mg | DELAYED_RELEASE_TABLET | Freq: Every day | ORAL | Status: DC
  Administered 2016-11-23 – 2016-11-24 (×2): 81 mg via ORAL
  Filled 2016-11-22 (×2): qty 1

## 2016-11-22 MED ORDER — IPRATROPIUM-ALBUTEROL 0.5-2.5 (3) MG/3ML IN SOLN
3.0000 mL | Freq: Four times a day (QID) | RESPIRATORY_TRACT | Status: DC
  Administered 2016-11-22: 3 mL via RESPIRATORY_TRACT
  Filled 2016-11-22: qty 3

## 2016-11-22 MED ORDER — IOPAMIDOL (ISOVUE-370) INJECTION 76%
100.0000 mL | Freq: Once | INTRAVENOUS | Status: AC | PRN
  Administered 2016-11-22: 100 mL via INTRAVENOUS

## 2016-11-22 MED ORDER — NITROGLYCERIN 0.4 MG SL SUBL: 0.4000 mg | SUBLINGUAL_TABLET | SUBLINGUAL | Status: DC | PRN

## 2016-11-22 MED ORDER — FENTANYL CITRATE (PF) 100 MCG/2ML IJ SOLN
25.0000 ug | Freq: Once | INTRAMUSCULAR | Status: AC
  Administered 2016-11-22: 25 ug via INTRAVENOUS
  Filled 2016-11-22: qty 2

## 2016-11-22 MED ORDER — ENSURE ENLIVE PO LIQD
237.0000 mL | Freq: Two times a day (BID) | ORAL | Status: DC
  Administered 2016-11-24 (×2): 237 mL via ORAL

## 2016-11-22 MED ORDER — DEXTROSE 5 % IV SOLN
1.0000 g | Freq: Once | INTRAVENOUS | Status: AC
  Administered 2016-11-22: 1 g via INTRAVENOUS
  Filled 2016-11-22: qty 10

## 2016-11-22 MED ORDER — AZITHROMYCIN 250 MG PO TABS
250.0000 mg | ORAL_TABLET | Freq: Every day | ORAL | Status: DC
  Administered 2016-11-23 – 2016-11-24 (×2): 250 mg via ORAL
  Filled 2016-11-22 (×2): qty 1

## 2016-11-22 MED ORDER — IPRATROPIUM-ALBUTEROL 0.5-2.5 (3) MG/3ML IN SOLN
3.0000 mL | Freq: Once | RESPIRATORY_TRACT | Status: AC
  Administered 2016-11-22: 3 mL via RESPIRATORY_TRACT
  Filled 2016-11-22: qty 3

## 2016-11-22 MED ORDER — ENOXAPARIN SODIUM 40 MG/0.4ML ~~LOC~~ SOLN
40.0000 mg | SUBCUTANEOUS | Status: DC
  Administered 2016-11-22: 40 mg via SUBCUTANEOUS
  Filled 2016-11-22: qty 0.4

## 2016-11-22 MED ORDER — IPRATROPIUM-ALBUTEROL 0.5-2.5 (3) MG/3ML IN SOLN
3.0000 mL | Freq: Three times a day (TID) | RESPIRATORY_TRACT | Status: DC
  Administered 2016-11-23 – 2016-11-24 (×4): 3 mL via RESPIRATORY_TRACT
  Filled 2016-11-22 (×5): qty 3

## 2016-11-22 MED ORDER — CARVEDILOL 6.25 MG PO TABS
6.2500 mg | ORAL_TABLET | Freq: Two times a day (BID) | ORAL | Status: DC
  Administered 2016-11-22 – 2016-11-24 (×4): 6.25 mg via ORAL
  Filled 2016-11-22: qty 1
  Filled 2016-11-22 (×2): qty 2
  Filled 2016-11-22: qty 1

## 2016-11-22 MED ORDER — CLORAZEPATE DIPOTASSIUM 7.5 MG PO TABS
7.5000 mg | ORAL_TABLET | Freq: Every day | ORAL | Status: DC | PRN
  Administered 2016-11-22 – 2016-11-24 (×2): 7.5 mg via ORAL
  Filled 2016-11-22 (×2): qty 1
  Filled 2016-11-22: qty 2

## 2016-11-22 NOTE — ED Triage Notes (Signed)
Had 4 baby aspirin at home.

## 2016-11-22 NOTE — ED Notes (Signed)
Patient transported to X-ray 

## 2016-11-22 NOTE — ED Provider Notes (Signed)
[facility] DEPT Provider Note   CSN: [id number] Arrival date & time: 11/22/16  1208     History   Chief Complaint Chief Complaint  Patient presents with  . Chest Pain    HPI [name] is a 81 y.o. female.  Pt presents to the ED today with cough and sob and CP for the past 4 days.  She saw her pcp yesterday, and was diagnosed with bronchitis.  Pt has been coughing a lot and sob has worsened.  Pt said the chest pain became worse about 45 minutes pta.  EMS started an IV, but otherwise no meds were given.  Pt did take 4 baby asa at home prior to EMS arrival.      Past Medical History:  Diagnosis Date  . Anxiety   . Cervical disc disorder with myelopathy, unspecified cervical region   . Chronic systolic (congestive) heart failure ([place])   . COPD (chronic obstructive pulmonary disease) ([place])   . Essential hypertension   . Hemorrhoids   . Liver mass   . Lung, cysts, congenital    Left lung cyst  . Nephrolithiasis    Embedded  . Nonischemic cardiomyopathy ([place])    LVEF 35-40% 2015  . Osteoarthritis   . Thoracic ascending aortic aneurysm (HCC)    4.3 cm April 2016    Patient Active Problem List   Diagnosis Date Noted  . Hypertensive cardiovascular disease 10/03/2015  . Protein-calorie malnutrition, severe 07/13/2015  . Demand ischemia of myocardium ([place]) 07/13/2015  . Nonischemic cardiomyopathy ([place])   . Atypical chest pain   . Left bundle branch block   . COPD exacerbation ([place]) 07/11/2015  . Flu-like symptoms 07/11/2015  . Elevated troponin 07/11/2015  . Acute bronchitis 07/11/2015  . GERD (gastroesophageal reflux disease) 07/11/2015  . Bronchospasm   . Ascending aortic aneurysm ([place]) 01/27/2015  . Chronic combined systolic and diastolic CHF (congestive heart failure) ([place]) 01/27/2015  . Chest pain at rest 01/27/2015  . Essential hypertension 01/27/2015  . Anxiety 01/27/2015  . Chronic pain 01/27/2015  . Chest pain 08/20/2013  . Hypertension  08/20/2013  . Contusion of left knee 08/07/2012  . Sprain of wrist 08/07/2012  . Liver mass 05/21/2011  . Aneurysm ([place]) 05/21/2011  . Abdominal pain 05/21/2011  . Bronchitis 05/21/2011  . De Quervain's disease (tenosynovitis) 04/02/2011  . SHOULDER PAIN 01/19/2009  . CERVICALGIA 01/19/2009  . IMPINGEMENT SYNDROME 01/19/2009    Past Surgical History:  Procedure Laterality Date  . Benign breast tumors    . CHOLECYSTECTOMY    . COLONOSCOPY    . COLONOSCOPY N/A 09/22/2014   Procedure: COLONOSCOPY;  Surgeon: Rogene Houston, MD;  Location: [facility];  Service: Endoscopy;  Laterality: N/A;  830 -- to be done in OR under fluoro  . Complete hysterectomy    . TONSILLECTOMY AND ADENOIDECTOMY      OB History    No data available       Home Medications    Prior to Admission medications   Medication Sig Start Date End Date Taking? Authorizing Provider  albuterol (PROVENTIL) (2.5 MG/3ML) 0.083% nebulizer solution Take 3 mLs (2.5 mg total) by nebulization every 6 (six) hours as needed for wheezing or shortness of breath. 07/13/15  Yes Sinda Du, MD  aspirin EC 81 MG tablet Take 81 mg by mouth every morning.    Yes [provider]  azithromycin (ZITHROMAX) 250 MG tablet Take 1-2 tablets by mouth daily. Take per package instructions; on day 1  take 2 tablets and on days 2-5 take 1 tablet 11/21/16  Yes [provider]  carvedilol (COREG) 6.25 MG tablet Take 6.25 mg by mouth 2 (two) times daily.    Yes [provider]  clorazepate (TRANXENE) 7.5 MG tablet Take 7.5 mg by mouth daily as needed for anxiety. For nerves   Yes [provider]  naproxen (NAPROSYN) 500 MG tablet Take 500 mg by mouth 2 (two) times daily with a meal.  08/16/14  Yes [provider]  PROAIR HFA 108 (90 BASE) MCG/ACT inhaler Inhale 2 puffs into the lungs every 6 (six) hours as needed for wheezing or shortness of breath.  02/08/14  Yes [provider]    Pseudoeph-Doxylamine-DM-APAP (NYQUIL MULTI-SYMPTOM PO) Take 1 capsule by mouth at bedtime.   Yes [provider]  Pseudoephedrine-APAP-DM (DAYQUIL MULTI-SYMPTOM PO) Take 1 capsule by mouth daily.   Yes [provider]  sacubitril-valsartan (ENTRESTO) 24-26 MG Take 1 tablet by mouth 2 (two) times daily. 08/29/16  Yes Herminio Commons, MD  traMADol (ULTRAM) 50 MG tablet Take 50 mg by mouth daily as needed for moderate pain or severe pain. Maximum dose= 8 tablets per day   Yes [provider]  methylPREDNISolone (MEDROL DOSEPAK) 4 MG TBPK tablet Take 1-6 tablets by mouth daily. Take per package instructions; on day 1 take 6 tablets, on day 2 take 5 tablets, on day 3 take 4 tablets, on day 4 take 3 tablets, on day 5 take 2 tablets, and on day 6 take 1 tablet 11/21/16   [provider]    Family History Family History  Problem Relation Age of Onset  . Heart disease Mother   . Aneurysm Father   . Lung cancer Brother   . Heart disease Sister   . Diabetes Brother   . Heart disease Brother     Social History Social History  Substance Use Topics  . Smoking status: Former Smoker    Packs/day: 0.25    Years: 31.00    Types: Cigarettes    Start date: 05/14/1977    Quit date: 05/28/2008  . Smokeless tobacco: Never Used  . Alcohol use No     Allergies   Alprazolam; Codeine; Percodan [oxycodone-aspirin]; and Valium   Review of Systems Review of Systems  Cardiovascular: Positive for chest pain.  All other systems reviewed and are negative.    Physical Exam Updated Vital Signs BP 122/64   Pulse 87   Resp (!) 21   Ht 5\' 2"  (1.575 m)   Wt 74.4 kg (164 lb)   SpO2 95%   BMI 30.00 kg/m   Physical Exam  Constitutional: She is oriented to person, place, and time. She appears well-developed and well-nourished. She appears distressed.  HENT:  Head: Normocephalic and atraumatic.  Right Ear: External ear normal.  Left Ear: External ear normal.   Nose: Nose normal.  Mouth/Throat: Oropharynx is clear and moist.  Eyes: Pupils are equal, round, and reactive to light. Conjunctivae and EOM are normal.  Neck: Normal range of motion. Neck supple.  Cardiovascular: Normal rate, regular rhythm, normal heart sounds and intact distal pulses.   Pulmonary/Chest: Effort normal. She has wheezes.  Abdominal: Soft. Bowel sounds are normal.  Musculoskeletal: Normal range of motion.  Anterior chest wall tenderness with palpation.  Neurological: She is alert and oriented to person, place, and time.  Skin: Skin is warm.  Psychiatric: She has a normal mood and affect. Her behavior is normal. Judgment and thought  content normal.  Nursing note and vitals reviewed.    ED Treatments / Results  Labs (all labs ordered are listed, but only abnormal results are displayed) Labs Reviewed  BASIC METABOLIC PANEL - Abnormal; Notable for the following:       Result Value   Chloride 100 (*)    Glucose, Bld 156 (*)    All other components within normal limits  TROPONIN I - Abnormal; Notable for the following:    Troponin I 0.06 (*)    All other components within normal limits  CBG MONITORING, ED - Abnormal; Notable for the following:    Glucose-Capillary 153 (*)    All other components within normal limits  CBG MONITORING, ED - Abnormal; Notable for the following:    Glucose-Capillary 155 (*)    All other components within normal limits  CBC  URINALYSIS, ROUTINE W REFLEX MICROSCOPIC    EKG  EKG Interpretation  Date/Time:  Thursday November 22 2016 12:13:11 EDT Ventricular Rate:  103 PR Interval:    QRS Duration: 172 QT Interval:  393 QTC Calculation: 515 R Axis:   20 Text Interpretation:  Sinus tachycardia Multiple ventricular premature complexes Left bundle branch block rate is faster, but otherwise, no significant change Confirmed by Isla Pence [phone]) on 11/22/2016 12:16:57 PM       Radiology Dg Chest 2 View  Result Date:  11/22/2016 CLINICAL DATA:  Cough and shortness of breath for 4 days. EXAM: CHEST  2 VIEW COMPARISON:  07/11/2015 FINDINGS: The heart is enlarged but stable. Stable tortuosity, calcification and tortuosity of the thoracic aorta. The pulmonary hila appear normal. Mild stable hyperinflation but no infiltrates, edema or effusions. The bony thorax is intact. Stable moderate degenerative changes involving the thoracic spine along with stable cervical spine fusion hardware and left humeral head prosthesis. IMPRESSION: No acute cardiopulmonary findings. Electronically Signed   By: Marijo Sanes M.D.   On: 11/22/2016 13:14    Procedures Procedures (including critical care time)  Medications Ordered in ED Medications  nitroGLYCERIN (NITROSTAT) SL tablet 0.4 mg (not administered)  cefTRIAXone (ROCEPHIN) 1 g in dextrose 5 % 50 mL IVPB (1 g Intravenous New Bag/Given 11/22/16 1433)  ipratropium-albuterol (DUONEB) 0.5-2.5 (3) MG/3ML nebulizer solution 3 mL (3 mLs Nebulization Given 11/22/16 1244)  methylPREDNISolone sodium succinate (SOLU-MEDROL) 125 mg/2 mL injection 125 mg (125 mg Intravenous Given 11/22/16 1321)  fentaNYL (SUBLIMAZE) injection 25 mcg (25 mcg Intravenous Given 11/22/16 1323)  albuterol (PROVENTIL,VENTOLIN) solution continuous neb (5 mg/hr Nebulization Given 11/22/16 1445)     Initial Impression / Assessment and Plan / ED Course  I have reviewed the triage vital signs and the nursing notes.  Pertinent labs & imaging results that were available during my care of the patient were reviewed by me and considered in my medical decision making (see chart for details).     O2 sat on 2L (pt not normally on oxygen) is 90%.  This drops with movement.  Pt said the neb helped her feel much better and get some of the stuff in her lungs up.  Troponin is slightly elevated, but it is chronically elevated and is stable.  CP is pleuritic.  Pt d/w Dr. [name] ([facility]) for admission.  Final Clinical  Impressions(s) / ED Diagnoses   Final diagnoses:  COPD exacerbation ([place])  Acute respiratory failure with hypoxia (HCC)  Chest wall pain  Acute bronchitis, unspecified organism    New Prescriptions New Prescriptions   No medications on file  Isla Pence, MD 11/22/16 1444

## 2016-11-22 NOTE — ED Notes (Signed)
Date and time results received: 11/22/16 1:14 PM  (use smartphrase ".now" to insert current time)  Test: Troponin Critical Value: 0.06  Name of Provider Notified: [place]  Orders Received? Or Actions Taken?: Orders Received - See Orders for details

## 2016-11-22 NOTE — ED Triage Notes (Signed)
Cough and sob times 4 days.  Seen PCP yesterday and diagnosed with bronchitis.  Now having Chest pain that started 45 min ago.

## 2016-11-22 NOTE — Progress Notes (Signed)
History and Physical    Boots Mcglown [place] TKW:[id number] DOB: [birth year] DOA: 11/22/2016  PCP: Sinda Du, MD   Patient coming from: Home  Chief Complaint:   HPI: [name] is a 81 y.o. female with medical history significant for COPD, ascending aortic aneurysm, combined  systolic and diastolic nonischemic heart failure, LBBB, hypertension, elevated troponin. Patient presented to the ED today with complaints that she suddenly had severe chest pain, at about 11 am this morning, in the center of her chest. Describes her chest that exploding, never had such chest pain in the past. Chest pain occurred when she was trying to lift objects which she described as not heavy. Associated diaphoresis, and difficulty catching her breath. Chest pain was nonradiating. Patient denies fever, no personal or family history of blood clots.  Patient was recently seen by her PCP, for COPD exacerbation. Her symptoms of shortness of breath and cough started about 5 days ago. She was prescribed Medrol Dosepak and azithromycin today would be her second day on both medications. She endorses persistence of both shortness of breath cough and associated wheezing for 5 days.  ED Course:O2 sats in the low to mid 90s on nasal cannula, stable blood pressure and pulse. Laboratory revealed stable creatinine at 0.78, WBC normal at 9.6, stable hematocrit at 13.8. Troponin was mildly elevated at 0.06 but this is chronically elevated. Chest x-ray was negative for acute cardiac pulmonary findings. Pt was given IV Solu-Medrol in the ED, DuoNeb's, given IV ceftriaxone. hospitalist was called to admit for COPD exacerbation.  Review of Systems:   CONSTITUTIONAL- No Fever, weightloss, night sweat or change in appetite. SKIN- No Rash, colour changes or itching. HEAD- No Headache or dizziness. GI- 1 episode of loose stool yesterday, resolved. No nausea, vomiting, constipation, abd pain. URINARY- No Frequency, urgency, straining or  dysuria. NEUROLOGIC- No Numbness, syncope, seizures or burning. [facility]- Denies depression or anxiety.  Past Medical History:  Diagnosis Date  . Anxiety   . Cervical disc disorder with myelopathy, unspecified cervical region   . Chronic systolic (congestive) heart failure ([place])   . COPD (chronic obstructive pulmonary disease) ([place])   . Essential hypertension   . Hemorrhoids   . Liver mass   . Lung, cysts, congenital    Left lung cyst  . Nephrolithiasis    Embedded  . Nonischemic cardiomyopathy ([place])    LVEF 35-40% 2015  . Osteoarthritis   . Thoracic ascending aortic aneurysm (HCC)    4.3 cm April 2016    Past Surgical History:  Procedure Laterality Date  . Benign breast tumors    . CHOLECYSTECTOMY    . COLONOSCOPY    . COLONOSCOPY N/A 09/22/2014   Procedure: COLONOSCOPY;  Surgeon: Rogene Houston, MD;  Location: [facility];  Service: Endoscopy;  Laterality: N/A;  830 -- to be done in OR under fluoro  . Complete hysterectomy    . TONSILLECTOMY AND ADENOIDECTOMY      reports that she quit smoking about 8 years ago. Her smoking use included Cigarettes. She started smoking about 39 years ago. She has a 7.75 pack-year smoking history. She has never used smokeless tobacco. She reports that she does not drink alcohol or use drugs.  Allergies  Allergen Reactions  . Alprazolam Nausea And Vomiting  . Codeine Nausea And Vomiting  . Percodan [Oxycodone-Aspirin] Nausea And Vomiting  . Valium Nausea And Vomiting    Family History  Problem Relation Age of Onset  . Heart disease Mother   .  Aneurysm Father   . Lung cancer Brother   . Heart disease Sister   . Diabetes Brother   . Heart disease Brother     Prior to Admission medications   Medication Sig Start Date End Date Taking? Authorizing Provider  albuterol (PROVENTIL) (2.5 MG/3ML) 0.083% nebulizer solution Take 3 mLs (2.5 mg total) by nebulization every 6 (six) hours as needed for wheezing or shortness of breath. 07/13/15   Yes Sinda Du, MD  aspirin EC 81 MG tablet Take 81 mg by mouth every morning.    Yes [provider]  azithromycin (ZITHROMAX) 250 MG tablet Take 1-2 tablets by mouth daily. Take per package instructions; on day 1 take 2 tablets and on days 2-5 take 1 tablet 11/21/16  Yes [provider]  carvedilol (COREG) 6.25 MG tablet Take 6.25 mg by mouth 2 (two) times daily.    Yes [provider]  clorazepate (TRANXENE) 7.5 MG tablet Take 7.5 mg by mouth daily as needed for anxiety. For nerves   Yes [provider]  naproxen (NAPROSYN) 500 MG tablet Take 500 mg by mouth 2 (two) times daily with a meal.  08/16/14  Yes [provider]  PROAIR HFA 108 (90 BASE) MCG/ACT inhaler Inhale 2 puffs into the lungs every 6 (six) hours as needed for wheezing or shortness of breath.  02/08/14  Yes [provider]  Pseudoeph-Doxylamine-DM-APAP (NYQUIL MULTI-SYMPTOM PO) Take 1 capsule by mouth at bedtime.   Yes [provider]  Pseudoephedrine-APAP-DM (DAYQUIL MULTI-SYMPTOM PO) Take 1 capsule by mouth daily.   Yes [provider]  sacubitril-valsartan (ENTRESTO) 24-26 MG Take 1 tablet by mouth 2 (two) times daily. 08/29/16  Yes Herminio Commons, MD  traMADol (ULTRAM) 50 MG tablet Take 50 mg by mouth daily as needed for moderate pain or severe pain. Maximum dose= 8 tablets per day   Yes [provider]  methylPREDNISolone (MEDROL DOSEPAK) 4 MG TBPK tablet Take 1-6 tablets by mouth daily. Take per package instructions; on day 1 take 6 tablets, on day 2 take 5 tablets, on day 3 take 4 tablets, on day 4 take 3 tablets, on day 5 take 2 tablets, and on day 6 take 1 tablet 11/21/16   [provider]   Physical Exam: Vitals:   11/22/16 1500 11/22/16 1530 11/22/16 1608 11/22/16 1620  BP: 137/66 122/60 (!) 122/95   Pulse: 87 98  98  Resp: 19 17 (!) 23 20  SpO2: 94% 93%  95%  Weight:      Height:        Constitutional: NAD, calm,  comfortable Vitals:   11/22/16 1500 11/22/16 1530 11/22/16 1608 11/22/16 1620  BP: 137/66 122/60 (!) 122/95   Pulse: 87 98  98  Resp: 19 17 (!) 23 20  SpO2: 94% 93%  95%  Weight:      Height:       Eyes: PERRL, lids and conjunctivae normal ENMT: Mucous membranes are moist. Posterior pharynx - Not visualized due to habitus  Neck: normal, supple, no masses, no thyromegaly Respiratory: Expiratory and inspiratory wheezing, congested breath sounds Cardiovascular: Regular rate and rhythm, no murmurs / rubs / gallops. No extremity edema. 2+ pedal pulses. No carotid bruits.  Abdomen: no tenderness, no masses palpated. No hepatosplenomegaly. Bowel sounds positive.  Musculoskeletal: no clubbing / cyanosis. Good ROM, no contractures. Normal muscle tone.  Skin: no rashes, lesions, ulcers. No induration Neurologic: CN 2-12 grossly intact. Sensation intact, DTR normal. Strength 4/5 left low  upper extremity chronic- from left shoulder injury, otherwise normal strength in all other extremities. Psychiatric: Normal judgment and insight. Alert and oriented x 3. Normal mood.   Labs on Admission: I have personally reviewed following labs and imaging studies  CBC:  Recent Labs Lab 11/22/16 1217  WBC 9.6  HGB 13.8  HCT 41.8  MCV 82.8  PLT 924   Basic Metabolic Panel:  Recent Labs Lab 11/22/16 1217  NA 137  K 3.9  CL 100*  CO2 29  GLUCOSE 156*  BUN 10  CREATININE 0.78  CALCIUM 10.0   Cardiac Enzymes:  Recent Labs Lab 11/22/16 1217  TROPONINI 0.06*   CBG:  Recent Labs Lab 11/22/16 1236 11/22/16 1319  GLUCAP 155* 153*   Urine analysis:    Component Value Date/Time   COLORURINE YELLOW 08/20/2013 1610   APPEARANCEUR CLEAR 08/20/2013 1610   LABSPEC 1.010 08/20/2013 1610   PHURINE 6.5 08/20/2013 1610   GLUCOSEU NEGATIVE 08/20/2013 1610   HGBUR NEGATIVE 08/20/2013 1610   BILIRUBINUR NEGATIVE 08/20/2013 1610   KETONESUR NEGATIVE 08/20/2013 1610   PROTEINUR NEGATIVE  08/20/2013 1610   UROBILINOGEN 0.2 08/20/2013 1610   NITRITE NEGATIVE 08/20/2013 1610   LEUKOCYTESUR NEGATIVE 08/20/2013 1610    Radiological Exams on Admission: Dg Chest 2 View  Result Date: 11/22/2016 CLINICAL DATA:  Cough and shortness of breath for 4 days. EXAM: CHEST  2 VIEW COMPARISON:  07/11/2015 FINDINGS: The heart is enlarged but stable. Stable tortuosity, calcification and tortuosity of the thoracic aorta. The pulmonary hila appear normal. Mild stable hyperinflation but no infiltrates, edema or effusions. The bony thorax is intact. Stable moderate degenerative changes involving the thoracic spine along with stable cervical spine fusion hardware and left humeral head prosthesis. IMPRESSION: No acute cardiopulmonary findings. Electronically Signed   By: Marijo Sanes M.D.   On: 11/22/2016 13:14    EKG: Independently reviewed. LBBB, unchanged from prior.  Assessment/Plan Principal Problem:   Chest pain Active Problems:   Hypertension   Ascending aortic aneurysm (HCC)   Chronic combined systolic and diastolic CHF (congestive heart failure) (HCC)   Essential hypertension   Anxiety   COPD exacerbation (HCC)   Elevated troponin   Left bundle branch block  Chest pain- patient with history of NICM, Ef- 35% 06/2015. Also history of ascending aortic aneurysm. Last CT angiogram chest 08/2016- grossly unchanged. 3 mm ascending thoracic aorta aneurysm. Trop X 1 - 0.06 at patient's baseline and EKG - LBBB, unchanged from prior - Admits to TELEMETRY, obs - Will get CT chest Aorta- W/Wo contrast, suggesting patient's history of ascending aortic aneurysm though vitals are stable, and normal chest x-ray- negative for dissection, thoracic aortic aneurysm appears stable. - Troponin 3 - EKG a.m. - Cardiology consult am - Aspirin 325 mg now, 81 mg daily - BMP am, after contrast exposure.  - Lipid panel am  COPD Exacerbation- wheezing or shortness of breath and cough- nonproductive. No  leukocytosis chest x-ray negative for infection. Was prescribed azithromycin and Medrol Dosepak by PCP. - Complete previously prescribed azithromycin - IV Solu Medrol 40 mg BID - DuoNeb's - Guaifenesin  Systolic and Diastolic congestive heart failure, with LBBB- stable, she appears euvolemic. EF- 35%, 06/2015. Not on diuretics.Jearld Shines with cardiologist Dr. Bronson Ing, and saw EP- Dr. Allred,10/03/15- per his evaluation EF is not less than 35%, does not meet criteria for a ICD, patient declined BIV pacemaker. - Continue Entresto and Coreg  HTN- stable - Entresto and Coreg  DVT prophylaxis: Lovenox Code Status:  Full Family Communication: None at bedside  Disposition Plan: Home Consults called: Cardiology Am Admission status: Tele, Obs    Bethena Roys MD [facility] Pager [phone].  If 7PM-7AM, please contact night-coverage [email or web address] Password [email or web address]  11/22/2016, 5:20 PM

## 2016-11-23 ENCOUNTER — Encounter (HOSPITAL_COMMUNITY)
Admission: EM | Disposition: A | Discharge status: Home / Self Care | Payer: Self-pay | Source: Home / Self Care | Attending: Cardiology

## 2016-11-23 ENCOUNTER — Other Ambulatory Visit: Payer: Self-pay

## 2016-11-23 ENCOUNTER — Encounter (HOSPITAL_COMMUNITY): Payer: Self-pay

## 2016-11-23 DIAGNOSIS — I712 Thoracic aortic aneurysm, without rupture: Secondary | ICD-10-CM | POA: Diagnosis present

## 2016-11-23 DIAGNOSIS — I214 Non-ST elevation (NSTEMI) myocardial infarction: Principal | ICD-10-CM

## 2016-11-23 DIAGNOSIS — I447 Left bundle-branch block, unspecified: Secondary | ICD-10-CM | POA: Diagnosis present

## 2016-11-23 DIAGNOSIS — K7689 Other specified diseases of liver: Secondary | ICD-10-CM | POA: Diagnosis present

## 2016-11-23 DIAGNOSIS — I219 Acute myocardial infarction, unspecified: Secondary | ICD-10-CM | POA: Diagnosis not present

## 2016-11-23 DIAGNOSIS — Z79899 Other long term (current) drug therapy: Secondary | ICD-10-CM | POA: Diagnosis not present

## 2016-11-23 DIAGNOSIS — G8929 Other chronic pain: Secondary | ICD-10-CM | POA: Diagnosis present

## 2016-11-23 DIAGNOSIS — I110 Hypertensive heart disease with heart failure: Secondary | ICD-10-CM | POA: Diagnosis present

## 2016-11-23 DIAGNOSIS — I313 Pericardial effusion (noninflammatory): Secondary | ICD-10-CM | POA: Diagnosis present

## 2016-11-23 DIAGNOSIS — Q446 Cystic disease of liver: Secondary | ICD-10-CM | POA: Diagnosis not present

## 2016-11-23 DIAGNOSIS — M5000 Cervical disc disorder with myelopathy, unspecified cervical region: Secondary | ICD-10-CM | POA: Diagnosis present

## 2016-11-23 DIAGNOSIS — I080 Rheumatic disorders of both mitral and aortic valves: Secondary | ICD-10-CM | POA: Diagnosis present

## 2016-11-23 DIAGNOSIS — J9601 Acute respiratory failure with hypoxia: Secondary | ICD-10-CM | POA: Diagnosis present

## 2016-11-23 DIAGNOSIS — I2510 Atherosclerotic heart disease of native coronary artery without angina pectoris: Secondary | ICD-10-CM | POA: Diagnosis not present

## 2016-11-23 DIAGNOSIS — Z885 Allergy status to narcotic agent status: Secondary | ICD-10-CM | POA: Diagnosis not present

## 2016-11-23 DIAGNOSIS — Z888 Allergy status to other drugs, medicaments and biological substances status: Secondary | ICD-10-CM | POA: Diagnosis not present

## 2016-11-23 DIAGNOSIS — Z7982 Long term (current) use of aspirin: Secondary | ICD-10-CM | POA: Diagnosis not present

## 2016-11-23 DIAGNOSIS — J441 Chronic obstructive pulmonary disease with (acute) exacerbation: Secondary | ICD-10-CM | POA: Diagnosis not present

## 2016-11-23 DIAGNOSIS — I200 Unstable angina: Secondary | ICD-10-CM | POA: Diagnosis not present

## 2016-11-23 DIAGNOSIS — F419 Anxiety disorder, unspecified: Secondary | ICD-10-CM | POA: Diagnosis present

## 2016-11-23 DIAGNOSIS — Z791 Long term (current) use of non-steroidal anti-inflammatories (NSAID): Secondary | ICD-10-CM | POA: Diagnosis not present

## 2016-11-23 DIAGNOSIS — I428 Other cardiomyopathies: Secondary | ICD-10-CM | POA: Diagnosis present

## 2016-11-23 DIAGNOSIS — Z87891 Personal history of nicotine dependence: Secondary | ICD-10-CM | POA: Diagnosis not present

## 2016-11-23 DIAGNOSIS — R7989 Other specified abnormal findings of blood chemistry: Secondary | ICD-10-CM | POA: Diagnosis not present

## 2016-11-23 DIAGNOSIS — I5042 Chronic combined systolic (congestive) and diastolic (congestive) heart failure: Secondary | ICD-10-CM | POA: Diagnosis not present

## 2016-11-23 DIAGNOSIS — M1990 Unspecified osteoarthritis, unspecified site: Secondary | ICD-10-CM | POA: Diagnosis present

## 2016-11-23 DIAGNOSIS — R0789 Other chest pain: Secondary | ICD-10-CM | POA: Diagnosis not present

## 2016-11-23 HISTORY — PX: LEFT HEART CATH AND CORONARY ANGIOGRAPHY: CATH118249

## 2016-11-23 HISTORY — PX: CORONARY STENT INTERVENTION: CATH118234

## 2016-11-23 LAB — LIPID PANEL
Cholesterol: 204 mg/dL — ABNORMAL HIGH (ref 0–200)
HDL: 79 mg/dL (ref >40)
LDL Cholesterol: 115 mg/dL — ABNORMAL HIGH (ref 0–99)
Total CHOL/HDL Ratio: 2.6 RATIO
Triglycerides: 50 mg/dL (ref <150)
VLDL: 10 mg/dL (ref 0–40)

## 2016-11-23 LAB — BASIC METABOLIC PANEL
Anion gap: 9 (ref 5–15)
BUN: 14 mg/dL (ref 6–20)
CO2: 26 mmol/L (ref 22–32)
Calcium: 10 mg/dL (ref 8.9–10.3)
Chloride: 103 mmol/L (ref 101–111)
Creatinine, Ser: 0.62 mg/dL (ref 0.44–1.00)
GFR calc Af Amer: >60 mL/min (ref >60)
GFR calc non Af Amer: >60 mL/min (ref >60)
Glucose, Bld: 138 mg/dL — ABNORMAL HIGH (ref 65–99)
Potassium: 4.1 mmol/L (ref 3.5–5.1)
Sodium: 138 mmol/L (ref 135–145)

## 2016-11-23 LAB — PROTIME-INR
INR: 1.03
Prothrombin Time: 13.5 seconds (ref 11.4–15.2)

## 2016-11-23 LAB — POCT ACTIVATED CLOTTING TIME
Activated Clotting Time: 285 seconds
Activated Clotting Time: 637 seconds

## 2016-11-23 LAB — HEPARIN LEVEL (UNFRACTIONATED)
Heparin Unfractionated: 0.19 IU/mL — ABNORMAL LOW (ref 0.30–0.70)
Heparin Unfractionated: 0.39 IU/mL (ref 0.30–0.70)

## 2016-11-23 LAB — APTT: aPTT: 42 seconds — ABNORMAL HIGH (ref 24–36)

## 2016-11-23 LAB — TROPONIN I: Troponin I: 3.96 ng/mL (ref <0.03)

## 2016-11-23 SURGERY — LEFT HEART CATH AND CORONARY ANGIOGRAPHY
Anesthesia: LOCAL

## 2016-11-23 MED ORDER — TICAGRELOR 90 MG PO TABS
90.0000 mg | ORAL_TABLET | Freq: Two times a day (BID) | ORAL | Status: DC
  Administered 2016-11-23 – 2016-11-24 (×2): 90 mg via ORAL
  Filled 2016-11-23 (×2): qty 1

## 2016-11-23 MED ORDER — VERAPAMIL HCL 2.5 MG/ML IV SOLN
INTRAVENOUS | Status: AC
  Filled 2016-11-23: qty 2

## 2016-11-23 MED ORDER — TICAGRELOR 90 MG PO TABS
ORAL_TABLET | ORAL | Status: AC
  Filled 2016-11-23: qty 2

## 2016-11-23 MED ORDER — HEPARIN (PORCINE) IN NACL 100-0.45 UNIT/ML-% IJ SOLN
1000.0000 [IU]/h | INTRAMUSCULAR | Status: DC
  Administered 2016-11-23: 800 [IU]/h via INTRAVENOUS
  Filled 2016-11-23: qty 250

## 2016-11-23 MED ORDER — HEPARIN (PORCINE) IN NACL 2-0.9 UNIT/ML-% IJ SOLN
INTRAMUSCULAR | Status: AC
  Filled 2016-11-23: qty 500

## 2016-11-23 MED ORDER — LIDOCAINE HCL 1 % IJ SOLN
INTRAMUSCULAR | Status: AC
  Filled 2016-11-23: qty 20

## 2016-11-23 MED ORDER — ACETAMINOPHEN 325 MG PO TABS: 650.0000 mg | ORAL_TABLET | ORAL | Status: DC | PRN

## 2016-11-23 MED ORDER — IOPAMIDOL (ISOVUE-370) INJECTION 76%
INTRAVENOUS | Status: AC
  Filled 2016-11-23: qty 200

## 2016-11-23 MED ORDER — SODIUM CHLORIDE 0.9% FLUSH: 3.0000 mL | INTRAVENOUS | Status: DC | PRN

## 2016-11-23 MED ORDER — SODIUM CHLORIDE 0.9 % IV SOLN
INTRAVENOUS | Status: DC
  Administered 2016-11-24: 06:00:00 via INTRAVENOUS

## 2016-11-23 MED ORDER — ATORVASTATIN CALCIUM 80 MG PO TABS
80.0000 mg | ORAL_TABLET | Freq: Every day | ORAL | Status: DC
  Administered 2016-11-24: 80 mg via ORAL
  Filled 2016-11-23: qty 1

## 2016-11-23 MED ORDER — GUAIFENESIN-DM 100-10 MG/5ML PO SYRP
5.0000 mL | ORAL_SOLUTION | ORAL | Status: DC | PRN
  Administered 2016-11-23 – 2016-11-24 (×2): 5 mL via ORAL
  Filled 2016-11-23 (×2): qty 5

## 2016-11-23 MED ORDER — IOPAMIDOL (ISOVUE-370) INJECTION 76%
INTRAVENOUS | Status: DC | PRN
  Administered 2016-11-23: 175 mL via INTRA_ARTERIAL

## 2016-11-23 MED ORDER — HEPARIN SODIUM (PORCINE) 1000 UNIT/ML IJ SOLN
INTRAMUSCULAR | Status: DC | PRN
  Administered 2016-11-23 (×2): 4000 [IU] via INTRAVENOUS

## 2016-11-23 MED ORDER — SODIUM CHLORIDE 0.9 % WEIGHT BASED INFUSION: 1.0000 mL/kg/h | INTRAVENOUS | Status: AC

## 2016-11-23 MED ORDER — TICAGRELOR 90 MG PO TABS
ORAL_TABLET | ORAL | Status: DC | PRN
  Administered 2016-11-23: 180 mg via ORAL

## 2016-11-23 MED ORDER — ASPIRIN EC 81 MG PO TBEC: 81.0000 mg | DELAYED_RELEASE_TABLET | ORAL | Status: DC

## 2016-11-23 MED ORDER — FENTANYL CITRATE (PF) 100 MCG/2ML IJ SOLN
INTRAMUSCULAR | Status: AC
  Filled 2016-11-23: qty 2

## 2016-11-23 MED ORDER — SODIUM CHLORIDE 0.9 % IV SOLN: 250.0000 mL | INTRAVENOUS | Status: DC | PRN

## 2016-11-23 MED ORDER — SODIUM CHLORIDE 0.9% FLUSH
3.0000 mL | Freq: Two times a day (BID) | INTRAVENOUS | Status: DC
  Administered 2016-11-23 – 2016-11-24 (×2): 3 mL via INTRAVENOUS

## 2016-11-23 MED ORDER — SODIUM CHLORIDE 0.9 % IV SOLN
INTRAVENOUS | Status: AC | PRN
  Administered 2016-11-23: 75 mL/h via INTRAVENOUS

## 2016-11-23 MED ORDER — LIDOCAINE HCL (PF) 1 % IJ SOLN
INTRAMUSCULAR | Status: DC | PRN
  Administered 2016-11-23: 2 mL via INTRADERMAL

## 2016-11-23 MED ORDER — FENTANYL CITRATE (PF) 100 MCG/2ML IJ SOLN
INTRAMUSCULAR | Status: DC | PRN
  Administered 2016-11-23: 25 ug via INTRAVENOUS

## 2016-11-23 MED ORDER — SODIUM CHLORIDE 0.9% FLUSH
3.0000 mL | Freq: Two times a day (BID) | INTRAVENOUS | Status: DC
  Administered 2016-11-24 (×2): 3 mL via INTRAVENOUS

## 2016-11-23 MED ORDER — HEPARIN (PORCINE) IN NACL 2-0.9 UNIT/ML-% IJ SOLN
INTRAMUSCULAR | Status: AC | PRN
  Administered 2016-11-23: 1000 mL

## 2016-11-23 MED ORDER — MIDAZOLAM HCL 2 MG/2ML IJ SOLN
INTRAMUSCULAR | Status: DC | PRN
  Administered 2016-11-23: 1 mg via INTRAVENOUS

## 2016-11-23 MED ORDER — ASPIRIN 81 MG PO CHEW: 81.0000 mg | CHEWABLE_TABLET | ORAL | Status: DC

## 2016-11-23 MED ORDER — HEPARIN (PORCINE) IN NACL 2-0.9 UNIT/ML-% IJ SOLN
INTRAMUSCULAR | Status: DC | PRN
  Administered 2016-11-23: 17:00:00 via INTRA_ARTERIAL

## 2016-11-23 MED ORDER — MIDAZOLAM HCL 2 MG/2ML IJ SOLN
INTRAMUSCULAR | Status: AC
  Filled 2016-11-23: qty 2

## 2016-11-23 MED ORDER — HEPARIN BOLUS VIA INFUSION
1000.0000 [IU] | Freq: Once | INTRAVENOUS | Status: AC
Start: 2016-11-23 — End: 2016-11-23
  Administered 2016-11-23: 1000 [IU] via INTRAVENOUS
  Filled 2016-11-23: qty 1000

## 2016-11-23 MED ORDER — HEPARIN SODIUM (PORCINE) 1000 UNIT/ML IJ SOLN
INTRAMUSCULAR | Status: AC
  Filled 2016-11-23: qty 1

## 2016-11-23 MED ORDER — ONDANSETRON HCL 4 MG/2ML IJ SOLN: 4.0000 mg | Freq: Four times a day (QID) | INTRAMUSCULAR | Status: DC | PRN

## 2016-11-23 SURGICAL SUPPLY — 18 items
BALLN SAPPHIRE 2.5X12 (BALLOONS) ×2
BALLN SAPPHIRE ~~LOC~~ 2.75X12 (BALLOONS) ×2 IMPLANT
BALLN SAPPHIRE ~~LOC~~ 3.5X15 (BALLOONS) ×2 IMPLANT
BALLOON SAPPHIRE 2.5X12 (BALLOONS) ×1 IMPLANT
CATH 5FR JL3.5 JR4 ANG PIG MP (CATHETERS) ×2 IMPLANT
CATH LAUNCHER 6FR EBU3.5 (CATHETERS) ×2 IMPLANT
DEVICE RAD COMP TR BAND LRG (VASCULAR PRODUCTS) ×2 IMPLANT
GLIDESHEATH SLEND SS 6F .021 (SHEATH) ×2 IMPLANT
GUIDEWIRE INQWIRE 1.5J.035X260 (WIRE) ×1 IMPLANT
INQWIRE 1.5J .035X260CM (WIRE) ×2
KIT ENCORE 26 ADVANTAGE (KITS) ×2 IMPLANT
KIT HEART LEFT (KITS) ×2 IMPLANT
PACK CARDIAC CATHETERIZATION (CUSTOM PROCEDURE TRAY) ×2 IMPLANT
STENT PROMUS PREM MR 2.5X16 (Permanent Stent) ×2 IMPLANT
STENT PROMUS PREM MR 3.5X32 (Permanent Stent) ×2 IMPLANT
TRANSDUCER W/STOPCOCK (MISCELLANEOUS) ×2 IMPLANT
TUBING CIL FLEX 10 FLL-RA (TUBING) ×2 IMPLANT
WIRE ASAHI PROWATER 180CM (WIRE) ×2 IMPLANT

## 2016-11-23 NOTE — Progress Notes (Addendum)
CRITICAL VALUE ALERT  Critical Value: troponion  Date & Time Notfied 11-22-2298  Provider Notified mid level, kirby  Orders Received/Actions taken:Notified mid-level of patient's increased troponin.  Mid-level ordered heparin drip and spoke to cardiology. At this present time, no orders for transfer given and patient stable.  Vitals stable, patient not complaining of chest pain, pharmacy will order heparin drip.  Will monitor patient.

## 2016-11-23 NOTE — Progress Notes (Signed)
RN paged elevated troponin of 3.94. Pt is currently chest pain free.   Pt admitted earlier with chest pain and COPD exacerbation. Troponin .06. Admitting MD ordered cardiology consult for 7/13 am. NP reviewed chart. Pt sees Dr. Bronson Ing, cards, in [place], for hx of ischemic cardiomyopathy. Last OV on chart 08/29/16.  Last echo NP could find was 2017 with EF 35%. Also, hx ascending thoracic aortic aneurysm which is confirmed as stable (4.2cm) per CT done 11/22/16. Pt takes Entresto and is on a BB. Lipid panel ordered for am.   Because of increased troponin, NP called Dr. Irish Lack, cardiologist on call, and discussed case. Per cards, as long as pt is not having active CP or unstable, can be seen in the am and transferred to [facility] as she will likely need a cardiac cath. Pt received 324mg  ASA 7/12. Heparin drip per pharmacy to be started now. ASA 81mg  daily ordered.  Discussed plan with RN. RN to call should pt have CP, SOB or any other cardiac sx for sooner transfer.  KJKG, NP [facility] Reported to Dr. Myna Hidalgo of [facility] as he will take over cross coverage at 1am.

## 2016-11-23 NOTE — Interval H&P Note (Signed)
History and Physical Interval Note:  11/23/2016 4:36 PM  [name]  has presented today for surgery, with the diagnosis of n stemi  The various methods of treatment have been discussed with the patient and family. After consideration of risks, benefits and other options for treatment, the patient has consented to  Procedure(s): Left Heart Cath and Coronary Angiography (N/A) as a surgical intervention .  The patient's history has been reviewed, patient examined, no change in status, stable for surgery.  I have reviewed the patient's chart and labs.  Questions were answered to the patient's satisfaction.   Cath Lab Visit (complete for each Cath Lab visit)  Clinical Evaluation Leading to the Procedure:   ACS: Yes.    Non-ACS:    Anginal Classification: CCS IV  Anti-ischemic medical therapy: Minimal Therapy (1 class of medications)  Non-Invasive Test Results: No non-invasive testing performed  Prior CABG: No previous CABG        Collier Salina [facility] 11/23/2016 4:37 PM

## 2016-11-23 NOTE — H&P (View-Only) (Signed)
Cardiology Consultation:   Patient ID: [name]; [id number]; Aug 16, 1935   Admit date: 11/22/2016 Date of Consult: 11/23/2016  Primary Care Provider: Sinda Du, MD Primary [place], MD Primary Electrophysiologist: Allred MD   Patient Profile:   [name] is a 81 y.o. female with a hx of  Nonischemic cardiomyopathy, hypertension, history of thoracic aortic aneurysm, chronic left bundle Jayme Cham block., With other history to include COPD, dyspnea on exertion, who is being seen today for the evaluation of chest pain at the request of Dr. Luan Pulling.  History of Present Illness:   [name] was admitted to the emergency room with complaints of severe chest pain, substernal, feeling her  "chest was exploding" which occurred while lifting a small harpsichord.  She had associated feelings of diaphoresis and difficulty breathing. She also reported pain radiating into the neck and face. Lasted approximately 5-10 minutes. She states that she called 911 and was told to take 4 baby ASA.  She is recently treated for COPD exacerbation by PCP and placed on a Medrol Dosepak and antibiotics.  On arrival to the emergency room patient's blood pressure was 151/81, heart rate 95, O2 sat 96%, respirations 21. She was afebrile. Review of labs revealed essentially normal, with the exception of chloride which was slightly lower when 100, glucose which was 156. Her creatinine was 0.78. She is not found to be anemic, she did not have leukocytosis. Patient initial troponin 0.06, however troponin is increased to 3.94 this a.m. EKG revealed sinus rhythm left bundle Antonella Upson block with PVCs.  CT scan of the chest was negative for pulmonary emboli, thoracic aortic aneurysm unchanged at 4.2 cm, no evidence of dissection. The patient was treated with albuterol inhalers, IV prednisone, fentanyl, given IV Rocephin 1 g, and admitted to rule out ACS.    Past Medical History:  Diagnosis Date  . Anxiety    . Cervical disc disorder with myelopathy, unspecified cervical region   . Chronic systolic (congestive) heart failure ([place])   . COPD (chronic obstructive pulmonary disease) ([place])   . Essential hypertension   . Hemorrhoids   . Liver mass   . Lung, cysts, congenital    Left lung cyst  . Nephrolithiasis    Embedded  . Nonischemic cardiomyopathy ([place])    LVEF 35-40% 2015  . Osteoarthritis   . Thoracic ascending aortic aneurysm ([facility])    4.3 cm April 2016    Past Surgical History:  Procedure Laterality Date  . Benign breast tumors    . CHOLECYSTECTOMY    . COLONOSCOPY    . COLONOSCOPY N/A 09/22/2014   Procedure: COLONOSCOPY;  Surgeon: Rogene Houston, MD;  Location: [facility];  Service: Endoscopy;  Laterality: N/A;  830 -- to be done in OR under fluoro  . Complete hysterectomy    . TONSILLECTOMY AND ADENOIDECTOMY       Inpatient Medications: Scheduled Meds: . aspirin EC  81 mg Oral Daily  . azithromycin  250 mg Oral Daily  . carvedilol  6.25 mg Oral BID WC  . enoxaparin (LOVENOX) injection  40 mg Subcutaneous Q24H  . feeding supplement (ENSURE ENLIVE)  237 mL Oral BID BM  . guaiFENesin  600 mg Oral BID  . ipratropium-albuterol  3 mL Nebulization TID  . methylPREDNISolone (SOLU-MEDROL) injection  40 mg Intravenous Q12H  . sacubitril-valsartan  1 tablet Oral BID   Continuous Infusions: . heparin 800 Units/hr (11/23/16 0259)   PRN Meds: albuterol, clorazepate, nitroGLYCERIN, ondansetron **OR** ondansetron (ZOFRAN) IV,  polyethylene glycol, traMADol  Allergies:    Allergies  Allergen Reactions  . Alprazolam Nausea And Vomiting  . Codeine Nausea And Vomiting  . Percodan [Oxycodone-Aspirin] Nausea And Vomiting  . Valium Nausea And Vomiting    Social History:   Social History   Social History  . Marital status: Widowed    Spouse name: N/A  . Number of children: N/A  . Years of education: 9th   Occupational History  .  Retired   Social History Main Topics  .  Smoking status: Former Smoker    Packs/day: 0.25    Years: 31.00    Types: Cigarettes    Start date: 05/14/1977    Quit date: 05/28/2008  . Smokeless tobacco: Never Used  . Alcohol use No  . Drug use: No  . Sexual activity: No   Other Topics Concern  . Not on file   Social History Narrative  . No narrative on file    Family History:   The patient's family history includes Aneurysm in her father; Diabetes in her brother; Heart disease in her brother, mother, and sister; Lung cancer in her brother.  ROS:  Please see the history of present illness.  ROS  All other ROS reviewed and negative.     Physical Exam/Data:   Vitals:   11/22/16 1755 11/22/16 1954 11/22/16 2155 11/23/16 0630  BP: 111/71  (!) 111/51 (!) 117/56  Pulse: (!) 101  89 76  Resp: 20  20 17   Temp:   98.6 F (37 C) 98.1 F (36.7 C)  TempSrc:   Oral Oral  SpO2: 92% 95% 96% 95%  Weight:      Height:        Intake/Output Summary (Last 24 hours) at 11/23/16 0734 Last data filed at 11/23/16 0600  Gross per 24 hour  Intake           434.13 ml  Output                0 ml  Net           434.13 ml   Filed Weights   11/22/16 1212  Weight: 164 lb (74.4 kg)   Body mass index is 30 kg/m.  General:  Well nourished, well developed, in no acute distress HEENT: normal Lymph: no adenopathy Neck: no JVD Endocrine:  No thryomegaly Vascular: No carotid bruits; FA pulses 2+ bilaterally without bruits  Cardiac:  normal S1, S2; RRR, distant heart sounds, occasional irregular beats. Lungs: Inspiratory rhonchi, and rales.  Abd: soft, nontender, no hepatomegaly  Ext: no edema Musculoskeletal:  No deformities, BUE and BLE strength normal and equal Skin: warm and dry  Neuro:  CNs 2-12 intact, no focal abnormalities noted Psych:  Normal affect   EKG:  The EKG was personally reviewed and demonstrates:  SR with LBBB Telemetry:  Telemetry was personally reviewed and demonstrates:  LBBB with PVC's.   Relevant CV  Studies: Echocardiogram  07/12/2015 Left ventricle: The cavity size was at the upper limits of   normal. Wall thickness was increased in a pattern of moderate   LVH. The estimated ejection fraction was 35%. Diffuse   hypokinesis. Doppler parameters are consistent with abnormal left   ventricular relaxation (grade 1 diastolic dysfunction). - Ventricular septum: Septal motion showed abnormal function and   dyssynergy. - Aortic valve: Moderately calcified annulus. Trileaflet. There was   mild regurgitation. - Mitral valve: Mildly thickened leaflets . There was mild   regurgitation. - Left atrium:  The atrium was mildly dilated. - Right atrium: Central venous pressure (est): 3 mm Hg. - Atrial septum: There was increased thickness of the septum,   consistent with lipomatous hypertrophy. No defect or patent   foramen ovale was identified. - Tricuspid valve: There was trivial regurgitation. - Pulmonary arteries: Systolic pressure could not be accurately   estimated. - Pericardium, extracardiac: A small pericardial effusion was   identified posterior to the heart and circumferential to the  Cardiac Cath 2005  1. Left main coronary artery was normal. 2. Left anterior descending artery was normal. 3. First and second diagonal branches were small. 4. Circumflex coronary artery was nondominant and normal. 5. The right coronary artery was dominant and normal. 6. RAO ventriculography: RAO ventriculography showed mild left ventricular  cavity enlargement with mild global hypokinesis. The EF was in the 40%  range. There was no significant MR. LV pressure was 162/19. Aortic  pressure was 154/77.  Laboratory Data:  Chemistry Recent Labs Lab 11/22/16 1217  NA 137  K 3.9  CL 100*  CO2 29  GLUCOSE 156*  BUN 10  CREATININE 0.78  CALCIUM 10.0  GFRNONAA >60  GFRAA >60  ANIONGAP 8    Hematology Recent Labs Lab 11/22/16 1217  WBC 9.6  RBC 5.05  HGB 13.8   HCT 41.8  MCV 82.8  MCH 27.3  MCHC 33.0  RDW 13.9  PLT 269   Cardiac Enzymes Recent Labs Lab 11/22/16 1217 11/22/16 2129  TROPONINI 0.06* 3.94*   No results for input(s): TROPIPOC in the last 168 hours.  BNPNo results for input(s): BNP, PROBNP in the last 168 hours.  DDimer No results for input(s): DDIMER in the last 168 hours.  Radiology/Studies:  Dg Chest 2 View  Result Date: 11/22/2016 CLINICAL DATA:  Cough and shortness of breath for 4 days. EXAM: CHEST  2 VIEW COMPARISON:  07/11/2015 FINDINGS: The heart is enlarged but stable. Stable tortuosity, calcification and tortuosity of the thoracic aorta. The pulmonary hila appear normal. Mild stable hyperinflation but no infiltrates, edema or effusions. The bony thorax is intact. Stable moderate degenerative changes involving the thoracic spine along with stable cervical spine fusion hardware and left humeral head prosthesis. IMPRESSION: No acute cardiopulmonary findings. Electronically Signed   By: Marijo Sanes M.D.   On: 11/22/2016 13:14   Ct Angio Chest Aorta W/cm &/or Wo/cm  Result Date: 11/22/2016 CLINICAL DATA:  Cough and shortness of breath. Bronchitis. Chest pain. EXAM: CT ANGIOGRAPHY CHEST WITH CONTRAST TECHNIQUE: Multidetector CT imaging of the chest was performed using the standard protocol during bolus administration of intravenous contrast. Multiplanar CT image reconstructions and MIPs were obtained to evaluate the vascular anatomy. CONTRAST:  100 mL of Isovue 370 COMPARISON:  Chest x-ray from earlier today and chest CT September 03, 2016 FINDINGS: Cardiovascular: Mild cardiomegaly identified. The known 4.2 cm ascending thoracic aortic aneurysm is stable. This was more completely described on the September 03, 2016 CT scan of the chest. No dissection. Minimal atherosclerotic change. Evaluation of the arch is limited due to streak artifact off the SVC and the left superior inner costal vein. No pulmonary emboli identified.  Mediastinum/Nodes: No enlarged mediastinal, hilar, or axillary lymph nodes. Thyroid gland, trachea, and esophagus demonstrate no significant findings. Lungs/Pleura: Central airways are normal. No pneumothorax. Mild bibasilar atelectasis. No suspicious nodules or masses. Upper Abdomen: The patient's known right hepatic lobe hemangioma was better seen on previous imaging. No other acute abnormalities in the upper abdomen. Musculoskeletal: No chest wall abnormality. No  acute or significant osseous findings. Review of the MIP images confirms the above findings. IMPRESSION: 1. No pulmonary emboli. 2. Known thoracic aortic aneurysm, better described on the September 03, 2016 study. No dissection today. Aortic aneurysm NOS (ICD10-I71.9). Aortic Atherosclerosis (ICD10-I70.0). Electronically Signed   By: Dorise Bullion III M.D   On: 11/22/2016 18:53    Assessment and Plan:   1. NSTEMI: She had initial troponin of 0.06, with second troponin of 3.96. She is now chest pain free, but continues to have chronic dyspnea. Will begin heparin gtt. Continue to cycle troponin. Continue coreg, ASA. Consider adding statin.  The patient understands that risks include but are not limited to stroke (1 in 1000), death (1 in 30), kidney failure [usually temporary] (1 in 500), bleeding (1 in 200), allergic reaction [possibly serious] (1 in 200), and agrees to proceed.   I have talked to Dr. Luan Pulling, PCP, who is in agreement with transfer.   2. History of NICM: Most recent echocardiogram in 2017 demonstrated EF of 35%, with diffuse hypokinesis. No evidence of CHF on CXR. Continue Entresto.  Creatinine this am, 0.70.   3. COPD: Currently being treated with antibiotics and neb tx, with steroids.  4. Chronic Pain; Patient admits to chronic pain, but current episode was much more profound and painful.    . Will plan to transfer to [facility] for cardiac cath. Will place on heparin.    Signed, Jory Sims DNP, ANP-C   11/23/2016  7:34 AM   Attending Note Patient seen and discussed with DNP Purcell Nails, I agree with her documentation above. Admitted with NSTEMI, troponin up to 3.96, EKG chronic LBBB. Started on heparin drip, we will plan for transfer to [facility] for cath. Continue current medical therapy for her prior history of NICM  Carlyle Dolly MD

## 2016-11-23 NOTE — Progress Notes (Signed)
Report given to [facility] at [employer].

## 2016-11-23 NOTE — Progress Notes (Signed)
ANTICOAGULATION CONSULT NOTE - Follow Up Consult  Pharmacy Consult for HEPARIN Indication: chest pain/ACS  Allergies  Allergen Reactions  . Alprazolam Nausea And Vomiting  . Codeine Nausea And Vomiting  . Percodan [Oxycodone-Aspirin] Nausea And Vomiting  . Valium Nausea And Vomiting   Patient Measurements: Height: 5\' 2"  (157.5 cm) Weight: 164 lb (74.4 kg) IBW/kg (Calculated) : 50.1 HEPARIN DW (KG): 66.2  Vital Signs: Temp: 98.3 F (36.8 C) (07/13 1505) Temp Source: Oral (07/13 1505) BP: 128/70 (07/13 1505) Pulse Rate: 80 (07/13 1505)  Labs:  Recent Labs  11/22/16 1217 11/22/16 2129 11/23/16 0737 11/23/16 1437  HGB 13.8  --   --   --   HCT 41.8  --   --   --   PLT 269  --   --   --   APTT  --   --  42*  --   LABPROT  --   --  13.5  --   INR  --   --  1.03  --   HEPARINUNFRC  --   --  0.19* 0.39  CREATININE 0.78  --  0.62  --   TROPONINI 0.06* 3.94*  --  3.96*   Estimated Creatinine Clearance: 53.8 mL/min (by C-G formula based on SCr of 0.62 mg/dL).  Medications:  Prescriptions Prior to Admission  Medication Sig Dispense Refill Last Dose  . albuterol (PROVENTIL) (2.5 MG/3ML) 0.083% nebulizer solution Take 3 mLs (2.5 mg total) by nebulization every 6 (six) hours as needed for wheezing or shortness of breath. 75 mL 12 Past Month at Unknown time  . aspirin EC 81 MG tablet Take 81 mg by mouth every morning.    11/22/2016 at 1030  . azithromycin (ZITHROMAX) 250 MG tablet Take 1-2 tablets by mouth daily. Take per package instructions; on day 1 take 2 tablets and on days 2-5 take 1 tablet   11/22/2016 at 0800  . carvedilol (COREG) 6.25 MG tablet Take 6.25 mg by mouth 2 (two) times daily.    11/21/2016 at 2000  . clorazepate (TRANXENE) 7.5 MG tablet Take 7.5 mg by mouth daily as needed for anxiety. For nerves   11/22/2016 at 0745  . naproxen (NAPROSYN) 500 MG tablet Take 500 mg by mouth 2 (two) times daily with a meal.    Past Week at Unknown time  . PROAIR HFA 108 (90 BASE)  MCG/ACT inhaler Inhale 2 puffs into the lungs every 6 (six) hours as needed for wheezing or shortness of breath.    11/22/2016 at [place]  . Pseudoeph-Doxylamine-DM-APAP (NYQUIL MULTI-SYMPTOM PO) Take 1 capsule by mouth at bedtime.   Past Week at Unknown time  . Pseudoephedrine-APAP-DM (DAYQUIL MULTI-SYMPTOM PO) Take 1 capsule by mouth daily.   Past Week at Unknown time  . sacubitril-valsartan (ENTRESTO) 24-26 MG Take 1 tablet by mouth 2 (two) times daily. 60 tablet 6 11/21/2016 at 2000  . traMADol (ULTRAM) 50 MG tablet Take 50 mg by mouth daily as needed for moderate pain or severe pain. Maximum dose= 8 tablets per day   Past Week at Unknown time  . methylPREDNISolone (MEDROL DOSEPAK) 4 MG TBPK tablet Take 1-6 tablets by mouth daily. Take per package instructions; on day 1 take 6 tablets, on day 2 take 5 tablets, on day 3 take 4 tablets, on day 4 take 3 tablets, on day 5 take 2 tablets, and on day 6 take 1 tablet   Not Taking at Unknown time   Assessment: 81 yo female admitted 11/22/16 for  chest pain and SOB. Pt has hx of ischemic cardiomyopathy and thoracic aortic aneurysm which is confirmed as stable per CT on admission. Pt now with increased troponin and is to be started on a heparin infusion per pharmacy dosing.    Follow up heparin level is not at goal.  No bleeding issues noted.   Plan to transfer to [facility] for cath.  Goal of Therapy:  Heparin level 0.3-0.7 units/ml Monitor platelets by anticoagulation protocol: Yes   Plan: Continue Heparin infusion at 1000 units/hr Check heparin level daily Monitor for s/sx bleeding complications  Erin Hearing PharmD., BCPS Clinical Pharmacist Pager [phone] 11/23/2016 4:04 PM

## 2016-11-23 NOTE — Care Management Obs Status (Signed)
[place] NOTIFICATION   Patient Details  Name: [name] MRN: [id number] Date of Birth: 04-21-1936   Medicare Observation Status Notification Given:  Yes    Sherald Barge, RN 11/23/2016, 10:53 AM

## 2016-11-23 NOTE — Progress Notes (Signed)
ANTICOAGULATION CONSULT NOTE - Preliminary  Pharmacy Consult for Heparin Indication: ACS/STEMI  Allergies  Allergen Reactions  . Alprazolam Nausea And Vomiting  . Codeine Nausea And Vomiting  . Percodan [Oxycodone-Aspirin] Nausea And Vomiting  . Valium Nausea And Vomiting    Patient Measurements: Height: 5\' 2"  (157.5 cm) Weight: 164 lb (74.4 kg) IBW/kg (Calculated) : 50.1 HEPARIN DW (KG): 66.2   Vital Signs: Temp: 98.6 F (37 C) (07/12 2155) Temp Source: Oral (07/12 2155) BP: 111/51 (07/12 2155) Pulse Rate: 89 (07/12 2155)  Labs:  Recent Labs  11/22/16 1217 11/22/16 2129  HGB 13.8  --   HCT 41.8  --   PLT 269  --   CREATININE 0.78  --   TROPONINI 0.06* 3.94*   Estimated Creatinine Clearance: 53.8 mL/min (by C-G formula based on SCr of 0.78 mg/dL).  Medical History: Past Medical History:  Diagnosis Date  . Anxiety   . Cervical disc disorder with myelopathy, unspecified cervical region   . Chronic systolic (congestive) heart failure ([place])   . COPD (chronic obstructive pulmonary disease) ([place])   . Essential hypertension   . Hemorrhoids   . Liver mass   . Lung, cysts, congenital    Left lung cyst  . Nephrolithiasis    Embedded  . Nonischemic cardiomyopathy ([place])    LVEF 35-40% 2015  . Osteoarthritis   . Thoracic ascending aortic aneurysm ([facility])    4.3 cm April 2016    Medications:  Enoxaparin 40 mg x 1 dose given on 7/12 at 2300  Assessment: 81 yo female admitted 11/22/16 for chest pain and SOB. Pt has hx of ischemic cardiomyopathy and thoracic aortic aneurysm which is confirmed as stable per CT on admission. Pt now with increased troponin and is to be started on a heparin infusion per pharmacy dosing.  Goal of Therapy:  Heparin goal level 0.3-0.7 units/ml Monitor platelets by anticoagulation protocol: Yes   Plan:  No loading dose due to enoxaparin. Heparin infusion 800 units/hr to be started @ 3 hrs after enoxaparin dose.  Preliminary review of  pertinent patient information completed.  Forestine Na clinical pharmacist will complete review during morning rounds to assess the patient and finalize treatment regimen.  Norberto Sorenson, [facility] 11/23/2016,12:10 AM

## 2016-11-23 NOTE — Consult Note (Addendum)
Cardiology Consultation:   Patient ID: Taiyana Kissler Kassin; [id number]; 29-Nov-1935   Admit date: 11/22/2016 Date of Consult: 11/23/2016  Primary Care Provider: Sinda Du, MD Primary Waldron, MD Primary Electrophysiologist: Allred MD   Patient Profile:   Marylene Masek Odonnel is a 81 y.o. female with a hx of  Nonischemic cardiomyopathy, hypertension, history of thoracic aortic aneurysm, chronic left bundle Valeen Borys block., With other history to include COPD, dyspnea on exertion, who is being seen today for the evaluation of chest pain at the request of Dr. Luan Pulling.  History of Present Illness:   Ms. Bonello was admitted to the emergency room with complaints of severe chest pain, substernal, feeling her  "chest was exploding" which occurred while lifting a small harpsichord.  She had associated feelings of diaphoresis and difficulty breathing. She also reported pain radiating into the neck and face. Lasted approximately 5-10 minutes. She states that she called 911 and was told to take 4 baby ASA.  She is recently treated for COPD exacerbation by PCP and placed on a Medrol Dosepak and antibiotics.  On arrival to the emergency room patient's blood pressure was 151/81, heart rate 95, O2 sat 96%, respirations 21. She was afebrile. Review of labs revealed essentially normal, with the exception of chloride which was slightly lower when 100, glucose which was 156. Her creatinine was 0.78. She is not found to be anemic, she did not have leukocytosis. Patient initial troponin 0.06, however troponin is increased to 3.94 this a.m. EKG revealed sinus rhythm left bundle Starlette Thurow block with PVCs.  CT scan of the chest was negative for pulmonary emboli, thoracic aortic aneurysm unchanged at 4.2 cm, no evidence of dissection. The patient was treated with albuterol inhalers, IV prednisone, fentanyl, given IV Rocephin 1 g, and admitted to rule out ACS.    Past Medical History:  Diagnosis Date  . Anxiety    . Cervical disc disorder with myelopathy, unspecified cervical region   . Chronic systolic (congestive) heart failure ([place])   . COPD (chronic obstructive pulmonary disease) ([place])   . Essential hypertension   . Hemorrhoids   . Liver mass   . Lung, cysts, congenital    Left lung cyst  . Nephrolithiasis    Embedded  . Nonischemic cardiomyopathy ([place])    LVEF 35-40% 2015  . Osteoarthritis   . Thoracic ascending aortic aneurysm ([facility])    4.3 cm April 2016    Past Surgical History:  Procedure Laterality Date  . Benign breast tumors    . CHOLECYSTECTOMY    . COLONOSCOPY    . COLONOSCOPY N/A 09/22/2014   Procedure: COLONOSCOPY;  Surgeon: Rogene Houston, MD;  Location: [facility];  Service: Endoscopy;  Laterality: N/A;  830 -- to be done in OR under fluoro  . Complete hysterectomy    . TONSILLECTOMY AND ADENOIDECTOMY       Inpatient Medications: Scheduled Meds: . aspirin EC  81 mg Oral Daily  . azithromycin  250 mg Oral Daily  . carvedilol  6.25 mg Oral BID WC  . enoxaparin (LOVENOX) injection  40 mg Subcutaneous Q24H  . feeding supplement (ENSURE ENLIVE)  237 mL Oral BID BM  . guaiFENesin  600 mg Oral BID  . ipratropium-albuterol  3 mL Nebulization TID  . methylPREDNISolone (SOLU-MEDROL) injection  40 mg Intravenous Q12H  . sacubitril-valsartan  1 tablet Oral BID   Continuous Infusions: . heparin 800 Units/hr (11/23/16 0259)   PRN Meds: albuterol, clorazepate, nitroGLYCERIN, ondansetron **OR** ondansetron (ZOFRAN) IV,  polyethylene glycol, traMADol  Allergies:    Allergies  Allergen Reactions  . Alprazolam Nausea And Vomiting  . Codeine Nausea And Vomiting  . Percodan [Oxycodone-Aspirin] Nausea And Vomiting  . Valium Nausea And Vomiting    Social History:   Social History   Social History  . Marital status: Widowed    Spouse name: N/A  . Number of children: N/A  . Years of education: 9th   Occupational History  .  Retired   Social History Main Topics  .  Smoking status: Former Smoker    Packs/day: 0.25    Years: 31.00    Types: Cigarettes    Start date: 05/14/1977    Quit date: 05/28/2008  . Smokeless tobacco: Never Used  . Alcohol use No  . Drug use: No  . Sexual activity: No   Other Topics Concern  . Not on file   Social History Narrative  . No narrative on file    Family History:   The patient's family history includes Aneurysm in her father; Diabetes in her brother; Heart disease in her brother, mother, and sister; Lung cancer in her brother.  ROS:  Please see the history of present illness.  ROS  All other ROS reviewed and negative.     Physical Exam/Data:   Vitals:   11/22/16 1755 11/22/16 1954 11/22/16 2155 11/23/16 0630  BP: 111/71  (!) 111/51 (!) 117/56  Pulse: (!) 101  89 76  Resp: 20  20 17   Temp:   98.6 F (37 C) 98.1 F (36.7 C)  TempSrc:   Oral Oral  SpO2: 92% 95% 96% 95%  Weight:      Height:        Intake/Output Summary (Last 24 hours) at 11/23/16 0734 Last data filed at 11/23/16 0600  Gross per 24 hour  Intake           434.13 ml  Output                0 ml  Net           434.13 ml   Filed Weights   11/22/16 1212  Weight: 164 lb (74.4 kg)   Body mass index is 30 kg/m.  General:  Well nourished, well developed, in no acute distress HEENT: normal Lymph: no adenopathy Neck: no JVD Endocrine:  No thryomegaly Vascular: No carotid bruits; FA pulses 2+ bilaterally without bruits  Cardiac:  normal S1, S2; RRR, distant heart sounds, occasional irregular beats. Lungs: Inspiratory rhonchi, and rales.  Abd: soft, nontender, no hepatomegaly  Ext: no edema Musculoskeletal:  No deformities, BUE and BLE strength normal and equal Skin: warm and dry  Neuro:  CNs 2-12 intact, no focal abnormalities noted Psych:  Normal affect   EKG:  The EKG was personally reviewed and demonstrates:  SR with LBBB Telemetry:  Telemetry was personally reviewed and demonstrates:  LBBB with PVC's.   Relevant CV  Studies: Echocardiogram  07/12/2015 Left ventricle: The cavity size was at the upper limits of   normal. Wall thickness was increased in a pattern of moderate   LVH. The estimated ejection fraction was 35%. Diffuse   hypokinesis. Doppler parameters are consistent with abnormal left   ventricular relaxation (grade 1 diastolic dysfunction). - Ventricular septum: Septal motion showed abnormal function and   dyssynergy. - Aortic valve: Moderately calcified annulus. Trileaflet. There was   mild regurgitation. - Mitral valve: Mildly thickened leaflets . There was mild   regurgitation. - Left atrium:  The atrium was mildly dilated. - Right atrium: Central venous pressure (est): 3 mm Hg. - Atrial septum: There was increased thickness of the septum,   consistent with lipomatous hypertrophy. No defect or patent   foramen ovale was identified. - Tricuspid valve: There was trivial regurgitation. - Pulmonary arteries: Systolic pressure could not be accurately   estimated. - Pericardium, extracardiac: A small pericardial effusion was   identified posterior to the heart and circumferential to the  Cardiac Cath 2005  1. Left main coronary artery was normal. 2. Left anterior descending artery was normal. 3. First and second diagonal branches were small. 4. Circumflex coronary artery was nondominant and normal. 5. The right coronary artery was dominant and normal. 6. RAO ventriculography: RAO ventriculography showed mild left ventricular  cavity enlargement with mild global hypokinesis. The EF was in the 40%  range. There was no significant MR. LV pressure was 162/19. Aortic  pressure was 154/77.  Laboratory Data:  Chemistry Recent Labs Lab 11/22/16 1217  NA 137  K 3.9  CL 100*  CO2 29  GLUCOSE 156*  BUN 10  CREATININE 0.78  CALCIUM 10.0  GFRNONAA >60  GFRAA >60  ANIONGAP 8    Hematology Recent Labs Lab 11/22/16 1217  WBC 9.6  RBC 5.05  HGB 13.8   HCT 41.8  MCV 82.8  MCH 27.3  MCHC 33.0  RDW 13.9  PLT 269   Cardiac Enzymes Recent Labs Lab 11/22/16 1217 11/22/16 2129  TROPONINI 0.06* 3.94*   No results for input(s): TROPIPOC in the last 168 hours.  BNPNo results for input(s): BNP, PROBNP in the last 168 hours.  DDimer No results for input(s): DDIMER in the last 168 hours.  Radiology/Studies:  Dg Chest 2 View  Result Date: 11/22/2016 CLINICAL DATA:  Cough and shortness of breath for 4 days. EXAM: CHEST  2 VIEW COMPARISON:  07/11/2015 FINDINGS: The heart is enlarged but stable. Stable tortuosity, calcification and tortuosity of the thoracic aorta. The pulmonary hila appear normal. Mild stable hyperinflation but no infiltrates, edema or effusions. The bony thorax is intact. Stable moderate degenerative changes involving the thoracic spine along with stable cervical spine fusion hardware and left humeral head prosthesis. IMPRESSION: No acute cardiopulmonary findings. Electronically Signed   By: Marijo Sanes M.D.   On: 11/22/2016 13:14   Ct Angio Chest Aorta W/cm &/or Wo/cm  Result Date: 11/22/2016 CLINICAL DATA:  Cough and shortness of breath. Bronchitis. Chest pain. EXAM: CT ANGIOGRAPHY CHEST WITH CONTRAST TECHNIQUE: Multidetector CT imaging of the chest was performed using the standard protocol during bolus administration of intravenous contrast. Multiplanar CT image reconstructions and MIPs were obtained to evaluate the vascular anatomy. CONTRAST:  100 mL of Isovue 370 COMPARISON:  Chest x-ray from earlier today and chest CT September 03, 2016 FINDINGS: Cardiovascular: Mild cardiomegaly identified. The known 4.2 cm ascending thoracic aortic aneurysm is stable. This was more completely described on the September 03, 2016 CT scan of the chest. No dissection. Minimal atherosclerotic change. Evaluation of the arch is limited due to streak artifact off the SVC and the left superior inner costal vein. No pulmonary emboli identified.  Mediastinum/Nodes: No enlarged mediastinal, hilar, or axillary lymph nodes. Thyroid gland, trachea, and esophagus demonstrate no significant findings. Lungs/Pleura: Central airways are normal. No pneumothorax. Mild bibasilar atelectasis. No suspicious nodules or masses. Upper Abdomen: The patient's known right hepatic lobe hemangioma was better seen on previous imaging. No other acute abnormalities in the upper abdomen. Musculoskeletal: No chest wall abnormality. No  acute or significant osseous findings. Review of the MIP images confirms the above findings. IMPRESSION: 1. No pulmonary emboli. 2. Known thoracic aortic aneurysm, better described on the September 03, 2016 study. No dissection today. Aortic aneurysm NOS (ICD10-I71.9). Aortic Atherosclerosis (ICD10-I70.0). Electronically Signed   By: Dorise Bullion III M.D   On: 11/22/2016 18:53    Assessment and Plan:   1. NSTEMI: She had initial troponin of 0.06, with second troponin of 3.96. She is now chest pain free, but continues to have chronic dyspnea. Will begin heparin gtt. Continue to cycle troponin. Continue coreg, ASA. Consider adding statin.  The patient understands that risks include but are not limited to stroke (1 in 1000), death (1 in 67), kidney failure [usually temporary] (1 in 500), bleeding (1 in 200), allergic reaction [possibly serious] (1 in 200), and agrees to proceed.   I have talked to Dr. Luan Pulling, PCP, who is in agreement with transfer.   2. History of NICM: Most recent echocardiogram in 2017 demonstrated EF of 35%, with diffuse hypokinesis. No evidence of CHF on CXR. Continue Entresto.  Creatinine this am, 0.70.   3. COPD: Currently being treated with antibiotics and neb tx, with steroids.  4. Chronic Pain; Patient admits to chronic pain, but current episode was much more profound and painful.    . Will plan to transfer [facility] for cardiac cath. Will place on heparin.    Signed, Jory Sims DNP, ANP-C   11/23/2016  7:34 AM   Attending Note Patient seen and discussed with DNP Purcell Nails, I agree with her documentation above. Admitted with NSTEMI, troponin up to 3.96, EKG chronic LBBB. Started on heparin drip, we will plan for transfer to [facility] for cath. Continue current medical therapy for her prior history of NICM  Carlyle Dolly MD

## 2016-11-23 NOTE — Progress Notes (Signed)
Subjective: She was admitted yesterday with chest discomfort that is very different from her chronic pain. She said she felt like her chest would explode. Pain radiated up into her neck and down into her shoulders. She was more short of breath. She's been treated for COPD exacerbation but the shortness of breath felt more like some heaviness in her chest. She had some diaphoresis. She did not have nausea. Her initial troponin was 0.06 but it's gone up to 3.96 with troponin level this morning pending. She's not having any more chest pain now. She is still short of breath. She is still coughing some.  Objective: Vital signs in last 24 hours: Temp:  [98.1 F (36.7 C)-98.6 F (37 C)] 98.1 F (36.7 C) (07/13 0630) Pulse Rate:  [76-101] 76 (07/13 0630) Resp:  [17-23] 17 (07/13 0630) BP: (111-156)/(51-95) 117/56 (07/13 0630) SpO2:  [87 %-98 %] 94 % (07/13 0751) Weight:  [74.4 kg (164 lb)] 74.4 kg (164 lb) (07/12 1212) Weight change:  Last BM Date: 11/22/16  Intake/Output from previous day: 07/12 0701 - 07/13 0700 In: 434.1 [P.O.:360; I.V.:24.1; IV Piggyback:50] Out: -   PHYSICAL EXAM General appearance: alert, cooperative and mild distress Resp: clear to auscultation bilaterally Cardio: regular rate and rhythm, S1, S2 normal, no murmur, click, rub or gallop GI: soft, non-tender; bowel sounds normal; no masses,  no organomegaly Extremities: extremities normal, atraumatic, no cyanosis or edema Skin warm and dry. She looks anxious  Lab Results:  Results for orders placed or performed during the hospital encounter of 11/22/16 (from the past 48 hour(s))  Basic metabolic panel     Status: Abnormal   Collection Time: 11/22/16 12:17 PM  Result Value Ref Range   Sodium 137 135 - 145 mmol/L   Potassium 3.9 3.5 - 5.1 mmol/L   Chloride 100 (L) 101 - 111 mmol/L   CO2 29 22 - 32 mmol/L   Glucose, Bld 156 (H) 65 - 99 mg/dL   BUN 10 6 - 20 mg/dL   Creatinine, Ser 0.78 0.44 - 1.00 mg/dL   Calcium  10.0 8.9 - 10.3 mg/dL   GFR calc non Af Amer >60 >60 mL/min   GFR calc Af Amer >60 >60 mL/min    Comment: (NOTE) The eGFR has been calculated using the CKD EPI equation. This calculation has not been validated in all clinical situations. eGFR's persistently <60 mL/min signify possible Chronic Kidney Disease.    Anion gap 8 5 - 15  CBC     Status: None   Collection Time: 11/22/16 12:17 PM  Result Value Ref Range   WBC 9.6 4.0 - 10.5 K/uL   RBC 5.05 3.87 - 5.11 MIL/uL   Hemoglobin 13.8 12.0 - 15.0 g/dL   HCT 41.8 36.0 - 46.0 %   MCV 82.8 78.0 - 100.0 fL   MCH 27.3 26.0 - 34.0 pg   MCHC 33.0 30.0 - 36.0 g/dL   RDW 13.9 11.5 - 15.5 %   Platelets 269 150 - 400 K/uL  Troponin I     Status: Abnormal   Collection Time: 11/22/16 12:17 PM  Result Value Ref Range   Troponin I 0.06 (HH) <0.03 ng/mL    Comment: CRITICAL RESULT CALLED TO, READ BACK BY AND VERIFIED WITH: PATRAW,B @ 1310 ON 7.12.18 BY BOWMAN,L   CBG monitoring, ED     Status: Abnormal   Collection Time: 11/22/16 12:36 PM  Result Value Ref Range   Glucose-Capillary 155 (H) 65 - 99 mg/dL  CBG monitoring,  ED     Status: Abnormal   Collection Time: 11/22/16  1:19 PM  Result Value Ref Range   Glucose-Capillary 153 (H) 65 - 99 mg/dL  Troponin I     Status: Abnormal   Collection Time: 11/22/16  9:29 PM  Result Value Ref Range   Troponin I 3.94 (HH) <0.03 ng/mL    Comment: CRITICAL RESULT CALLED TO, READ BACK BY AND VERIFIED WITH: JOHNSON,B ON 11/22/16 AT 2255 BY LOY,C     ABGS No results for input(s): PHART, PO2ART, TCO2, HCO3 in the last 72 hours.  Invalid input(s): PCO2 CULTURES No results found for this or any previous visit (from the past 240 hour(s)). Studies/Results: Dg Chest 2 View  Result Date: 11/22/2016 CLINICAL DATA:  Cough and shortness of breath for 4 days. EXAM: CHEST  2 VIEW COMPARISON:  07/11/2015 FINDINGS: The heart is enlarged but stable. Stable tortuosity, calcification and tortuosity of the thoracic  aorta. The pulmonary hila appear normal. Mild stable hyperinflation but no infiltrates, edema or effusions. The bony thorax is intact. Stable moderate degenerative changes involving the thoracic spine along with stable cervical spine fusion hardware and left humeral head prosthesis. IMPRESSION: No acute cardiopulmonary findings. Electronically Signed   By: Marijo Sanes M.D.   On: 11/22/2016 13:14   Ct Angio Chest Aorta W/cm &/or Wo/cm  Result Date: 11/22/2016 CLINICAL DATA:  Cough and shortness of breath. Bronchitis. Chest pain. EXAM: CT ANGIOGRAPHY CHEST WITH CONTRAST TECHNIQUE: Multidetector CT imaging of the chest was performed using the standard protocol during bolus administration of intravenous contrast. Multiplanar CT image reconstructions and MIPs were obtained to evaluate the vascular anatomy. CONTRAST:  100 mL of Isovue 370 COMPARISON:  Chest x-ray from earlier today and chest CT September 03, 2016 FINDINGS: Cardiovascular: Mild cardiomegaly identified. The known 4.2 cm ascending thoracic aortic aneurysm is stable. This was more completely described on the September 03, 2016 CT scan of the chest. No dissection. Minimal atherosclerotic change. Evaluation of the arch is limited due to streak artifact off the SVC and the left superior inner costal vein. No pulmonary emboli identified. Mediastinum/Nodes: No enlarged mediastinal, hilar, or axillary lymph nodes. Thyroid gland, trachea, and esophagus demonstrate no significant findings. Lungs/Pleura: Central airways are normal. No pneumothorax. Mild bibasilar atelectasis. No suspicious nodules or masses. Upper Abdomen: The patient's known right hepatic lobe hemangioma was better seen on previous imaging. No other acute abnormalities in the upper abdomen. Musculoskeletal: No chest wall abnormality. No acute or significant osseous findings. Review of the MIP images confirms the above findings. IMPRESSION: 1. No pulmonary emboli. 2. Known thoracic aortic aneurysm,  better described on the September 03, 2016 study. No dissection today. Aortic aneurysm NOS (ICD10-I71.9). Aortic Atherosclerosis (ICD10-I70.0). Electronically Signed   By: Dorise Bullion III M.D   On: 11/22/2016 18:53    Medications:  Prior to Admission:  Prescriptions Prior to Admission  Medication Sig Dispense Refill Last Dose  . albuterol (PROVENTIL) (2.5 MG/3ML) 0.083% nebulizer solution Take 3 mLs (2.5 mg total) by nebulization every 6 (six) hours as needed for wheezing or shortness of breath. 75 mL 12 Past Month at Unknown time  . aspirin EC 81 MG tablet Take 81 mg by mouth every morning.    11/22/2016 at 1030  . azithromycin (ZITHROMAX) 250 MG tablet Take 1-2 tablets by mouth daily. Take per package instructions; on day 1 take 2 tablets and on days 2-5 take 1 tablet   11/22/2016 at 0800  . carvedilol (COREG) 6.25 MG tablet  Take 6.25 mg by mouth 2 (two) times daily.    11/21/2016 at 2000  . clorazepate (TRANXENE) 7.5 MG tablet Take 7.5 mg by mouth daily as needed for anxiety. For nerves   11/22/2016 at 0745  . naproxen (NAPROSYN) 500 MG tablet Take 500 mg by mouth 2 (two) times daily with a meal.    Past Week at Unknown time  . PROAIR HFA 108 (90 BASE) MCG/ACT inhaler Inhale 2 puffs into the lungs every 6 (six) hours as needed for wheezing or shortness of breath.    11/22/2016 at [place]  . Pseudoeph-Doxylamine-DM-APAP (NYQUIL MULTI-SYMPTOM PO) Take 1 capsule by mouth at bedtime.   Past Week at Unknown time  . Pseudoephedrine-APAP-DM (DAYQUIL MULTI-SYMPTOM PO) Take 1 capsule by mouth daily.   Past Week at Unknown time  . sacubitril-valsartan (ENTRESTO) 24-26 MG Take 1 tablet by mouth 2 (two) times daily. 60 tablet 6 11/21/2016 at 2000  . traMADol (ULTRAM) 50 MG tablet Take 50 mg by mouth daily as needed for moderate pain or severe pain. Maximum dose= 8 tablets per day   Past Week at Unknown time  . methylPREDNISolone (MEDROL DOSEPAK) 4 MG TBPK tablet Take 1-6 tablets by mouth daily. Take per package  instructions; on day 1 take 6 tablets, on day 2 take 5 tablets, on day 3 take 4 tablets, on day 4 take 3 tablets, on day 5 take 2 tablets, and on day 6 take 1 tablet   Not Taking at Unknown time   Scheduled: . aspirin EC  81 mg Oral Daily  . azithromycin  250 mg Oral Daily  . carvedilol  6.25 mg Oral BID WC  . feeding supplement (ENSURE ENLIVE)  237 mL Oral BID BM  . guaiFENesin  600 mg Oral BID  . ipratropium-albuterol  3 mL Nebulization TID  . methylPREDNISolone (SOLU-MEDROL) injection  40 mg Intravenous Q12H  . sacubitril-valsartan  1 tablet Oral BID   Continuous: . heparin 800 Units/hr (11/23/16 0259)   OHK:GOVPCHEKB, clorazepate, nitroGLYCERIN, ondansetron **OR** ondansetron (ZOFRAN) IV, polyethylene glycol, traMADol  Assesment: She has chest pain and looks like she's had an MI. She has left bundle branch block so we can't tell about ST elevation. She has troponin level pending this morning. She's not on heparin and that's going to be started. Cardiology has already seen her and plan is to transfer her to [place] Problem:   Chest pain Active Problems:   Hypertension   Ascending aortic aneurysm (HCC)   Chronic combined systolic and diastolic CHF (congestive heart failure) (HCC)   Essential hypertension   Anxiety   COPD exacerbation (HCC)   Elevated troponin   Left bundle branch block    Plan: Transfer to [facility] for cardiac catheterization and further cardiac care    LOS: 0 days   [name] L 11/23/2016, 8:11 AM

## 2016-11-23 NOTE — Progress Notes (Signed)
ANTICOAGULATION CONSULT NOTE - Follow Up Consult  Pharmacy Consult for HEPARIN Indication: chest pain/ACS  Allergies  Allergen Reactions  . Alprazolam Nausea And Vomiting  . Codeine Nausea And Vomiting  . Percodan [Oxycodone-Aspirin] Nausea And Vomiting  . Valium Nausea And Vomiting   Patient Measurements: Height: 5\' 2"  (157.5 cm) Weight: 164 lb (74.4 kg) IBW/kg (Calculated) : 50.1 HEPARIN DW (KG): 66.2  Vital Signs: Temp: 98.1 F (36.7 C) (07/13 0630) Temp Source: Oral (07/13 0630) BP: 117/56 (07/13 0630) Pulse Rate: 76 (07/13 0630)  Labs:  Recent Labs  11/22/16 1217 11/22/16 2129 11/23/16 0737  HGB 13.8  --   --   HCT 41.8  --   --   PLT 269  --   --   APTT  --   --  42*  LABPROT  --   --  13.5  INR  --   --  1.03  HEPARINUNFRC  --   --  0.19*  CREATININE 0.78  --  0.62  TROPONINI 0.06* 3.94*  --    Estimated Creatinine Clearance: 53.8 mL/min (by C-G formula based on SCr of 0.62 mg/dL).  Medications:  Prescriptions Prior to Admission  Medication Sig Dispense Refill Last Dose  . albuterol (PROVENTIL) (2.5 MG/3ML) 0.083% nebulizer solution Take 3 mLs (2.5 mg total) by nebulization every 6 (six) hours as needed for wheezing or shortness of breath. 75 mL 12 Past Month at Unknown time  . aspirin EC 81 MG tablet Take 81 mg by mouth every morning.    11/22/2016 at 1030  . azithromycin (ZITHROMAX) 250 MG tablet Take 1-2 tablets by mouth daily. Take per package instructions; on day 1 take 2 tablets and on days 2-5 take 1 tablet   11/22/2016 at 0800  . carvedilol (COREG) 6.25 MG tablet Take 6.25 mg by mouth 2 (two) times daily.    11/21/2016 at 2000  . clorazepate (TRANXENE) 7.5 MG tablet Take 7.5 mg by mouth daily as needed for anxiety. For nerves   11/22/2016 at 0745  . naproxen (NAPROSYN) 500 MG tablet Take 500 mg by mouth 2 (two) times daily with a meal.    Past Week at Unknown time  . PROAIR HFA 108 (90 BASE) MCG/ACT inhaler Inhale 2 puffs into the lungs every 6 (six)  hours as needed for wheezing or shortness of breath.    11/22/2016 at [place]  . Pseudoeph-Doxylamine-DM-APAP (NYQUIL MULTI-SYMPTOM PO) Take 1 capsule by mouth at bedtime.   Past Week at Unknown time  . Pseudoephedrine-APAP-DM (DAYQUIL MULTI-SYMPTOM PO) Take 1 capsule by mouth daily.   Past Week at Unknown time  . sacubitril-valsartan (ENTRESTO) 24-26 MG Take 1 tablet by mouth 2 (two) times daily. 60 tablet 6 11/21/2016 at 2000  . traMADol (ULTRAM) 50 MG tablet Take 50 mg by mouth daily as needed for moderate pain or severe pain. Maximum dose= 8 tablets per day   Past Week at Unknown time  . methylPREDNISolone (MEDROL DOSEPAK) 4 MG TBPK tablet Take 1-6 tablets by mouth daily. Take per package instructions; on day 1 take 6 tablets, on day 2 take 5 tablets, on day 3 take 4 tablets, on day 4 take 3 tablets, on day 5 take 2 tablets, and on day 6 take 1 tablet   Not Taking at Unknown time   Assessment: 81 yo female admitted 11/22/16 for chest pain and SOB. Pt has hx of ischemic cardiomyopathy and thoracic aortic aneurysm which is confirmed as stable per CT on admission. Pt now  with increased troponin and is to be started on a heparin infusion per pharmacy dosing.  Initial heparin level is below goal.  Pt did get a dose of Lovenox last night prior to starting heparin.  Goal of Therapy:  Heparin level 0.3-0.7 units/ml Monitor platelets by anticoagulation protocol: Yes   Plan: Heparin 1000 units IV now x 1 (rebolus) Increase Heparin infusion to 1000 units/hr Check heparin level in ~6 hrs and daily Monitor for s/sx bleeding complications  Hart Robinsons A 11/23/2016,8:29 AM

## 2016-11-23 NOTE — Progress Notes (Signed)
Nutrition Brief Note  Patient identified on the Malnutrition Screening Tool (MST) Report. Had reported unintentional weight loss and a decrease in appetite.   Wt Readings from Last 15 Encounters:  11/22/16 164 lb (74.4 kg)  10/31/16 170 lb 11.2 oz (77.4 kg)  08/29/16 170 lb (77.1 kg)  10/03/15 175 lb 12.8 oz (79.7 kg)  08/30/15 177 lb (80.3 kg)  07/13/15 169 lb 6.4 oz (76.8 kg)  06/24/15 178 lb (80.7 kg)  01/27/15 174 lb 13.2 oz (79.3 kg)  09/22/14 185 lb (83.9 kg)  08/25/14 186 lb (84.4 kg)  02/25/14 181 lb (82.1 kg)  11/27/13 177 lb (80.3 kg)  08/20/13 181 lb 14.1 oz (82.5 kg)  06/09/13 178 lb (80.7 kg)  11/24/12 180 lb (81.6 kg)    Body mass index is 30 kg/m. Patient meets criteria for obese based on current BMI.   Current diet order is NPO in anticipation of catheterization.   Pt reports that she had a decrease in appetite when she lost one of her children in 2014. Since that time, she has just eaten less. Her appetite has been stable since that time.   Per review of her weights. They have been very stable. The weight from this admission is slightly lower, but most of her weight are from outpatient visits where she would have had clothes, shoes and items in pockets. She does not sounds to be at nutritional risk at this time. SHer says she is very hungry/thirsty now and asking what having a little glass of milk would hurt.    No nutrition interventions warranted at this time. If nutrition issues arise, please consult RD.   Burtis Junes RD, LDN, CNSC Clinical Nutrition Pager: [phone] 11/23/2016 3:22 PM

## 2016-11-24 ENCOUNTER — Inpatient Hospital Stay (HOSPITAL_COMMUNITY): Payer: Medicare Other

## 2016-11-24 DIAGNOSIS — I200 Unstable angina: Secondary | ICD-10-CM

## 2016-11-24 DIAGNOSIS — R7989 Other specified abnormal findings of blood chemistry: Secondary | ICD-10-CM

## 2016-11-24 LAB — CBC
HCT: 42.9 % (ref 36.0–46.0)
Hemoglobin: 14 g/dL (ref 12.0–15.0)
MCH: 26.7 pg (ref 26.0–34.0)
MCHC: 32.6 g/dL (ref 30.0–36.0)
MCV: 81.9 fL (ref 78.0–100.0)
Platelets: 322 10*3/uL (ref 150–400)
RBC: 5.24 MIL/uL — ABNORMAL HIGH (ref 3.87–5.11)
RDW: 14.1 % (ref 11.5–15.5)
WBC: 14.3 10*3/uL — ABNORMAL HIGH (ref 4.0–10.5)

## 2016-11-24 LAB — BASIC METABOLIC PANEL
Anion gap: 8 (ref 5–15)
BUN: 13 mg/dL (ref 6–20)
CO2: 24 mmol/L (ref 22–32)
Calcium: 9.9 mg/dL (ref 8.9–10.3)
Chloride: 105 mmol/L (ref 101–111)
Creatinine, Ser: 0.73 mg/dL (ref 0.44–1.00)
GFR calc Af Amer: >60 mL/min (ref >60)
GFR calc non Af Amer: >60 mL/min (ref >60)
Glucose, Bld: 158 mg/dL — ABNORMAL HIGH (ref 65–99)
Potassium: 3.9 mmol/L (ref 3.5–5.1)
Sodium: 137 mmol/L (ref 135–145)

## 2016-11-24 LAB — ECHOCARDIOGRAM COMPLETE
Height: 62 in
Weight: 2613.77 oz

## 2016-11-24 LAB — MRSA PCR SCREENING: MRSA by PCR: NEGATIVE

## 2016-11-24 MED ORDER — ATORVASTATIN CALCIUM 80 MG PO TABS: 80.0000 mg | ORAL_TABLET | Freq: Every day | ORAL | 6 refills | Status: DC

## 2016-11-24 MED ORDER — IPRATROPIUM-ALBUTEROL 0.5-2.5 (3) MG/3ML IN SOLN: 3.0000 mL | Freq: Two times a day (BID) | RESPIRATORY_TRACT | Status: DC

## 2016-11-24 MED ORDER — TICAGRELOR 90 MG PO TABS: 90.0000 mg | ORAL_TABLET | Freq: Two times a day (BID) | ORAL | 11 refills | Status: DC

## 2016-11-24 MED ORDER — NITROGLYCERIN 0.4 MG SL SUBL: 0.4000 mg | SUBLINGUAL_TABLET | SUBLINGUAL | 12 refills | Status: DC | PRN

## 2016-11-24 NOTE — Progress Notes (Signed)
CARDIAC REHAB PHASE I   PRE:  Rate/Rhythm: 10 SR, LBBB w/ PVCs  BP:  Sitting: 124/56       SaO2: 95 RA  MODE:  Ambulation: 340 ft   POST:  Rate/Rhythm: 88 SR, LBBB w/ PVCs  BP:  Sitting: 135/62        SaO2: 94 RA  Pt ambulated 340 w/o assistance and did very well.  Reviewed education with pt and discussed exercise/nutrition, Brilinta/ASA use, signs/symptoms and when to call Dr./911.  Pt verbalized understanding.  Will refer to [facility] CRPII. Also, spoke with Pt's nurse, Pamala Hurry, to put in an order for the case manager to bring [place] card and check pt's insurance coverage for Brilinta [phone] Meta Hatchet, [place] 11/24/2016 10:21 AM

## 2016-11-24 NOTE — Progress Notes (Signed)
  Echocardiogram 2D Echocardiogram has been performed.  [name] 11/24/2016, 3:51 PM

## 2016-11-24 NOTE — Discharge Summary (Signed)
Discharge Summary    Patient ID: [name],  MRN: [id number], DOB/AGE: 08-02-1935 81 y.o.  Admit date: 11/22/2016 Discharge date: 11/25/2016  Primary Care Provider: Sinda Du Primary Cardiologist: Dr. Bronson Ing Electrophysiologist: Dr. Rayann Heman   Discharge Diagnoses    Principal Problem:   Chest pain/NSTEMI Active Problems:   Hypertension   Ascending aortic aneurysm (HCC)   Chronic combined systolic and diastolic CHF (congestive heart failure) (HCC)   Essential hypertension   Anxiety   COPD exacerbation (HCC)   Elevated troponin   Left bundle branch block   NSTEMI (non-ST elevated myocardial infarction) (HCC)   Allergies Allergies  Allergen Reactions  . Alprazolam Nausea And Vomiting  . Codeine Nausea And Vomiting  . Percodan [Oxycodone-Aspirin] Nausea And Vomiting  . Valium Nausea And Vomiting    Diagnostic Studies/Procedures     LHC 07/20/63: LV end diastolic pressure is normal.  Mid Cx lesion, 30 %stenosed.  Prox RCA lesion, 20 %stenosed.  Prox LAD to Mid LAD lesion, 85 %stenosed.  A STENT PROMUS PREM MR 3.5X32 drug eluting stent was successfully placed.  Post intervention, there is a 0% residual stenosis.  1st Mrg lesion, 90 %stenosed.  A STENT PROMUS PREM MR 2.5X16 drug eluting stent was successfully placed.  Post intervention, there is a 0% residual stenosis.  1. 2 vessel obstructive CAD - 85% segmental mid LAD - 90% mid OM1.  2. Normal LVEDP 3. Successful stenting of the mid LAD with DES 4. Successful stenting of the mid OM1 with DES   Echo 11/24/16 Study Conclusions  - Left ventricle: The cavity size was normal. There was mild   concentric hypertrophy. Systolic function was mildly to   moderately reduced. The estimated ejection fraction was in the   range of 40% to 45%. Diffuse hypokinesis worse in the antero and   inferoseptum. Doppler parameters are consistent with abnormal   left ventricular relaxation (grade 1  diastolic dysfunction).   Doppler parameters are consistent with indeterminate ventricular   filling pressure. - Aortic valve: Transvalvular velocity was within the normal range.   There was no stenosis. There was moderate regurgitation. Valve   area (VTI): 1.6 cm^2. Valve area (Vmax): 1.6 cm^2. Valve area   (Vmean): 1.59 cm^2. - Mitral valve: Transvalvular velocity was within the normal range.   There was no evidence for stenosis. There was trivial   regurgitation. - Right ventricle: The cavity size was normal. Wall thickness was   normal. Systolic function was normal. - Atrial septum: No defect or patent foramen ovale was identified   by color flow Doppler. - Tricuspid valve: There was no regurgitation.    History of Present Illness     81 y.o. female with chronic systolic and diastolic heart failure (non-ischemic), hypertension, LBBB, and thoracic aortic aneurysm presented to [facility] with chest pain that is different from her chronic pain.   Presented with severe chest pain, substernal, feeling her  "chest was exploding" which occurred while lifting a small harpsichord.  She had associated feelings of diaphoresis and difficulty breathing. She also reported pain radiating into the neck and face. Lasted approximately 5-10 minutes. She states that she called 911 and was told to take 4 baby ASA.  She is recently treated for COPD exacerbation by PCP and placed on a Medrol Dosepak and antibiotics.  On arrival to the emergency room patient's blood pressure was 151/81, heart rate 95, O2 sat 96%, respirations 21. She was afebrile. Review of labs revealed essentially normal, with  the exception of chloride which was slightly lower when 100, glucose which was 156. Her creatinine was 0.78. She is not found to be anemic, she did not have leukocytosis. Patient initial troponin 0.06, however troponin is increased to 3.94.  EKG revealed sinus rhythm left bundle branch block with PVCs.  CT scan of the chest  was negative for pulmonary emboli, thoracic aortic aneurysm unchanged at 4.2 cm, no evidence of dissection. The patient was treated with albuterol inhalers, IV prednisone, fentanyl, given IV Rocephin 1 g, and admitted to rule out ACS.  Transferred to [facility] for [facility].    Hospital Course     Consultants: pulmonary/IM as attending at [facility]  #1.  NSTEMI: Ms. Cosma underwent successful PCI of the mid LAD and OM1 on 11/23/16. Continue aspirin, ticagrelor, carvedilol, and atorvastatin.  Atorvastatin was started this admission.  She will need lipids and a CMP in 6 weeks.   #2. Chronic systolic and diastolic heart failure: She appears euvolemic.  Continue carvedilol and Entresto.  LVEDP was 13 mmHg on cath.  Echo showed LVEF of 40-45% (improved from 35% 2/17), grade 1 DD, moderate AI.   #3. HLD - 11/23/2016: Cholesterol 204; HDL 79; LDL Cholesterol 115; Triglycerides 50; VLDL 10  - LDL goal less than 70. Statin started this admission.   #4.  COPD:  Currently being treated for COPD exacerbation.  She reports shortness of breath with exertion that suspected more related to her COPD than heart failure.  Echo as above. Continue steroids and abx.    The patient has been seen by Dr. Oval Linsey  today and deemed ready for discharge home. All follow-up appointments have been scheduled. Discharge medications are listed below.   Discharge Vitals Blood pressure (!) 122/58, pulse 76, temperature 97.6 F (36.4 C), temperature source Oral, resp. rate (!) 24, height 5\' 2"  (1.575 m), weight 163 lb 5.8 oz (74.1 kg), SpO2 93 %.  Filed Weights   11/22/16 1212 11/24/16 0455  Weight: 164 lb (74.4 kg) 163 lb 5.8 oz (74.1 kg)    Labs & Radiologic Studies     CBC  Recent Labs  11/22/16 1217 11/24/16 0413  WBC 9.6 14.3*  HGB 13.8 14.0  HCT 41.8 42.9  MCV 82.8 81.9  PLT 269 751   Basic Metabolic Panel  Recent Labs  11/23/16 0737 11/24/16 0413  NA 138 137  K 4.1 3.9  CL 103 105  CO2 26 24  GLUCOSE  138* 158*  BUN 14 13  CREATININE 0.62 0.73  CALCIUM 10.0 9.9   Cardiac Enzymes  Recent Labs  11/22/16 1217 11/22/16 2129 11/23/16 1437  TROPONINI 0.06* 3.94* 3.96*   Fasting Lipid Panel  Recent Labs  11/23/16 0737  CHOL 204*  HDL 79  LDLCALC 115*  TRIG 50  CHOLHDL 2.6   Thyroid Function Tests No results for input(s): TSH, T4TOTAL, T3FREE, THYROIDAB in the last 72 hours.  Invalid input(s): FREET3  Dg Chest 2 View  Result Date: 11/22/2016 CLINICAL DATA:  Cough and shortness of breath for 4 days. EXAM: CHEST  2 VIEW COMPARISON:  07/11/2015 FINDINGS: The heart is enlarged but stable. Stable tortuosity, calcification and tortuosity of the thoracic aorta. The pulmonary hila appear normal. Mild stable hyperinflation but no infiltrates, edema or effusions. The bony thorax is intact. Stable moderate degenerative changes involving the thoracic spine along with stable cervical spine fusion hardware and left humeral head prosthesis. IMPRESSION: No acute cardiopulmonary findings. Electronically Signed   By: Ricky Stabs.D.  On: 11/22/2016 13:14   US Abdomen Complete  Result Date: 11/08/2016 CLINICAL DATA:  Chronic abdominal pain EXAM: ABDOMEN ULTRASOUND COMPLETE COMPARISON:  08/21/2013. FINDINGS: Gallbladder: Surgically removed Common bile duct: Diameter: 6 mm Liver: 3.5 cm hyperechoic lesion is noted within the right lobe of the liver. This corresponds to a hemangioma seen on prior CT examination. No other focal abnormality is noted. IVC: No abnormality visualized. Pancreas: Visualized portion unremarkable. Spleen: Size and appearance within normal limits. Right Kidney: Length: 10.7 cm. Echogenicity within normal limits. No mass or hydronephrosis visualized. Left Kidney: Length: 10.4 cm. A 3.8 cm cyst is noted within the left kidney. Some mild septation is noted similar to that seen on prior CT examination. A smaller lower pole cyst is noted measuring 1.8 cm as well. Abdominal aorta: No  aneurysm visualized. Other findings: None. IMPRESSION: Left renal cystic change stable from prior CT examination. Hepatic hemangioma. No acute abnormality noted. Electronically Signed   By: Inez Catalina M.D.   On: 11/08/2016 09:14   Ct Angio Chest Aorta W/cm &/or Wo/cm  Result Date: 11/22/2016 CLINICAL DATA:  Cough and shortness of breath. Bronchitis. Chest pain. EXAM: CT ANGIOGRAPHY CHEST WITH CONTRAST TECHNIQUE: Multidetector CT imaging of the chest was performed using the standard protocol during bolus administration of intravenous contrast. Multiplanar CT image reconstructions and MIPs were obtained to evaluate the vascular anatomy. CONTRAST:  100 mL of Isovue 370 COMPARISON:  Chest x-ray from earlier today and chest CT September 03, 2016 FINDINGS: Cardiovascular: Mild cardiomegaly identified. The known 4.2 cm ascending thoracic aortic aneurysm is stable. This was more completely described on the September 03, 2016 CT scan of the chest. No dissection. Minimal atherosclerotic change. Evaluation of the arch is limited due to streak artifact off the SVC and the left superior inner costal vein. No pulmonary emboli identified. Mediastinum/Nodes: No enlarged mediastinal, hilar, or axillary lymph nodes. Thyroid gland, trachea, and esophagus demonstrate no significant findings. Lungs/Pleura: Central airways are normal. No pneumothorax. Mild bibasilar atelectasis. No suspicious nodules or masses. Upper Abdomen: The patient's known right hepatic lobe hemangioma was better seen on previous imaging. No other acute abnormalities in the upper abdomen. Musculoskeletal: No chest wall abnormality. No acute or significant osseous findings. Review of the MIP images confirms the above findings. IMPRESSION: 1. No pulmonary emboli. 2. Known thoracic aortic aneurysm, better described on the September 03, 2016 study. No dissection today. Aortic aneurysm NOS (ICD10-I71.9). Aortic Atherosclerosis (ICD10-I70.0). Electronically Signed   By: Dorise Bullion III M.D   On: 11/22/2016 18:53    Disposition   Pt is being discharged home today in good condition.  Follow-up Plans & Appointments    Follow-up Information    Herminio Commons, MD Follow up.   Specialty:  Cardiology Why:  office will call with time and date Contact information: [place] [address] [phone]          Discharge Instructions    AMB Referral to Cardiac Rehabilitation - Phase II    Complete by:  As directed    Diagnosis:  NSTEMI   Amb Referral to Cardiac Rehabilitation    Complete by:  As directed    Diagnosis:   NSTEMI Coronary Stents PTCA     Diet - low sodium heart healthy    Complete by:  As directed    Discharge instructions    Complete by:  As directed    NO HEAVY LIFTING (>10lbs) X 2 WEEKS. NO SEXUAL ACTIVITY X 2 WEEKS.  NO DRIVING X 1 WEEK. NO SOAKING BATHS, HOT TUBS, POOLS, ETC., X 7 DAYS.   Increase activity slowly    Complete by:  As directed       Discharge Medications   Discharge Medication List as of 11/24/2016  6:59 PM    START taking these medications   Details  atorvastatin (LIPITOR) 80 MG tablet Take 1 tablet (80 mg total) by mouth daily at 6 PM., Starting Sat 11/24/2016, Normal    nitroGLYCERIN (NITROSTAT) 0.4 MG SL tablet Place 1 tablet (0.4 mg total) under the tongue every 5 (five) minutes x 3 doses as needed for chest pain., Starting Sat 11/24/2016, Normal    ticagrelor (BRILINTA) 90 MG TABS tablet Take 1 tablet (90 mg total) by mouth 2 (two) times daily., Starting Sat 11/24/2016, Normal      CONTINUE these medications which have NOT CHANGED   Details  albuterol (PROVENTIL) (2.5 MG/3ML) 0.083% nebulizer solution Take 3 mLs (2.5 mg total) by nebulization every 6 (six) hours as needed for wheezing or shortness of breath., Starting Wed 07/13/2015, Normal    aspirin EC 81 MG tablet Take 81 mg by mouth every morning. , Until Discontinued, Historical Med    azithromycin (ZITHROMAX) 250 MG tablet  Take 1-2 tablets by mouth daily. Take per package instructions; on day 1 take 2 tablets and on days 2-5 take 1 tablet, Starting Wed 11/21/2016, Historical Med    carvedilol (COREG) 6.25 MG tablet Take 6.25 mg by mouth 2 (two) times daily. , Historical Med    clorazepate (TRANXENE) 7.5 MG tablet Take 7.5 mg by mouth daily as needed for anxiety. For nerves, Until Discontinued, Historical Med    methylPREDNISolone (MEDROL DOSEPAK) 4 MG TBPK tablet Take 1-6 tablets by mouth daily. Take per package instructions; on day 1 take 6 tablets, on day 2 take 5 tablets, on day 3 take 4 tablets, on day 4 take 3 tablets, on day 5 take 2 tablets, and on day 6 take 1 tablet, Starting Wed 11/21/2016, Historical Me d    PROAIR HFA 108 (90 BASE) MCG/ACT inhaler Inhale 2 puffs into the lungs every 6 (six) hours as needed for wheezing or shortness of breath. , Starting 02/08/2014, Until Discontinued, Historical Med    Pseudoeph-Doxylamine-DM-APAP (NYQUIL MULTI-SYMPTOM PO) Take 1 capsule by mouth at bedtime., Historical Med    sacubitril-valsartan (ENTRESTO) 24-26 MG Take 1 tablet by mouth 2 (two) times daily., Starting Wed 08/29/2016, Print    traMADol (ULTRAM) 50 MG tablet Take 50 mg by mouth daily as needed for moderate pain or severe pain. Maximum dose= 8 tablets per day, Until Discontinued, Historical Med      STOP taking these medications     naproxen (NAPROSYN) 500 MG tablet      Pseudoephedrine-APAP-DM (DAYQUIL MULTI-SYMPTOM PO)          Aspirin prescribed at discharge?  Yes High Intensity Statin Prescribed? (Lipitor 40-80mg  or Crestor 20-40mg ): Yes Beta Blocker Prescribed? Yes For EF 45% or less, Was ACEI/ARB Prescribed? Yes - on Entresto ADP Receptor Inhibitor Prescribed? (i.e. Plavix etc.-Includes Medically Managed Patients): Yes For EF <40%, Aldosterone Inhibitor Prescribed? N/A Was EF assessed during THIS hospitalization? Yes Was Cardiac Rehab II ordered? (Included Medically managed Patients):  Yes   Outstanding Labs/Studies   Consider OP f/u labs 6-8 weeks given statin initiation this admission.  Duration of Discharge Encounter   Greater than 30 minutes including physician time.  Signed, Sache Sane PA-C 11/25/2016, 8:01 AM

## 2016-11-24 NOTE — Progress Notes (Signed)
Pt given D/c paper work. Pt stated that she understood the D/C instructions and signed the paper work. Pt IV's were removed. All belonging went with the pt. Pt wheeled off the unit by this RN in a wheelchair.

## 2016-11-24 NOTE — Progress Notes (Signed)
Progress Note  Patient Name: [name] Date of Encounter: 11/24/2016  Primary Cardiologist: Dr. Bronson Ing Electrophysiologist: Dr. Rayann Heman   Subjective   Feeling well.  Short of breath with exertion.  Finally getting phlegm up.   Inpatient Medications    Scheduled Meds: . aspirin EC  81 mg Oral Daily  . atorvastatin  80 mg Oral q1800  . azithromycin  250 mg Oral Daily  . carvedilol  6.25 mg Oral BID WC  . feeding supplement (ENSURE ENLIVE)  237 mL Oral BID BM  . guaiFENesin  600 mg Oral BID  . ipratropium-albuterol  3 mL Nebulization BID  . methylPREDNISolone (SOLU-MEDROL) injection  40 mg Intravenous Q12H  . sacubitril-valsartan  1 tablet Oral BID  . sodium chloride flush  3 mL Intravenous Q12H  . sodium chloride flush  3 mL Intravenous Q12H  . ticagrelor  90 mg Oral BID   Continuous Infusions: . sodium chloride    . sodium chloride 10 mL/hr at 11/24/16 0534  . sodium chloride     PRN Meds: sodium chloride, sodium chloride, acetaminophen, albuterol, clorazepate, guaiFENesin-dextromethorphan, nitroGLYCERIN, ondansetron (ZOFRAN) IV, ondansetron **OR** [DISCONTINUED] ondansetron (ZOFRAN) IV, polyethylene glycol, sodium chloride flush, sodium chloride flush, traMADol   Vital Signs    Vitals:   11/24/16 0700 11/24/16 0745 11/24/16 0800 11/24/16 0900  BP: 116/77  134/60 (!) 124/56  Pulse:   69 76  Resp:   20 (!) 24  Temp: (!) 97.5 F (36.4 C)     TempSrc: Oral     SpO2: 99% 99% 95% 96%  Weight:      Height:        Intake/Output Summary (Last 24 hours) at 11/24/16 1052 Last data filed at 11/24/16 0800  Gross per 24 hour  Intake          1091.13 ml  Output                0 ml  Net          1091.13 ml   Filed Weights   11/22/16 1212 11/24/16 0455  Weight: 74.4 kg (164 lb) 74.1 kg (163 lb 5.8 oz)    Telemetry    Sinus rhythm.  LBBB.  PVCs.  Ventricular bigeminy - Personally Reviewed  ECG    11/24/16: Sinus rhythm.  Rate 78 bpm.  LBBB.  PVCs -  Personally Reviewed  Physical Exam   VS:  BP (!) 124/56   Pulse 76   Temp (!) 97.5 F (36.4 C) (Oral)   Resp (!) 24   Ht 5\' 2"  (1.575 m)   Wt 74.1 kg (163 lb 5.8 oz)   SpO2 96%   BMI 29.88 kg/m  , BMI Body mass index is 29.88 kg/m. GENERAL:  Well appearing HEENT: Pupils equal round and reactive, fundi not visualized, oral mucosa unremarkable NECK:  No jugular venous distention, waveform within normal limits, carotid upstroke brisk and symmetric, no bruits, no thyromegaly LYMPHATICS:  No cervical adenopathy LUNGS:  Diffuse rhonchi and expiratory wheeze  HEART:  RRR.  PMI not displaced or sustained,S1 and S2 within normal limits, no S3, no S4, no clicks, no rubs, no murmurs ABD:  Flat, positive bowel sounds normal in frequency in pitch, no bruits, no rebound, no guarding, no midline pulsatile mass, no hepatomegaly, no splenomegaly EXT:  2 plus pulses throughout, no edema, no cyanosis no clubbing SKIN:  No rashes no nodules NEURO:  Cranial nerves II through XII grossly intact, motor grossly intact throughout  PSYCH:  Cognitively intact, oriented to person place and time   Labs    Chemistry Recent Labs Lab 11/22/16 1217 11/23/16 0737 11/24/16 0413  NA 137 138 137  K 3.9 4.1 3.9  CL 100* 103 105  CO2 29 26 24   GLUCOSE 156* 138* 158*  BUN 10 14 13   CREATININE 0.78 0.62 0.73  CALCIUM 10.0 10.0 9.9  GFRNONAA >60 >60 >60  GFRAA >60 >60 >60  ANIONGAP 8 9 8      Hematology Recent Labs Lab 11/22/16 1217 11/24/16 0413  WBC 9.6 14.3*  RBC 5.05 5.24*  HGB 13.8 14.0  HCT 41.8 42.9  MCV 82.8 81.9  MCH 27.3 26.7  MCHC 33.0 32.6  RDW 13.9 14.1  PLT 269 322    Cardiac Enzymes Recent Labs Lab 11/22/16 1217 11/22/16 2129 11/23/16 1437  TROPONINI 0.06* 3.94* 3.96*   No results for input(s): TROPIPOC in the last 168 hours.   BNPNo results for input(s): BNP, PROBNP in the last 168 hours.   DDimer No results for input(s): DDIMER in the last 168 hours.   Radiology      Dg Chest 2 View  Result Date: 11/22/2016 CLINICAL DATA:  Cough and shortness of breath for 4 days. EXAM: CHEST  2 VIEW COMPARISON:  07/11/2015 FINDINGS: The heart is enlarged but stable. Stable tortuosity, calcification and tortuosity of the thoracic aorta. The pulmonary hila appear normal. Mild stable hyperinflation but no infiltrates, edema or effusions. The bony thorax is intact. Stable moderate degenerative changes involving the thoracic spine along with stable cervical spine fusion hardware and left humeral head prosthesis. IMPRESSION: No acute cardiopulmonary findings. Electronically Signed   By: Marijo Sanes M.D.   On: 11/22/2016 13:14   Ct Angio Chest Aorta W/cm &/or Wo/cm  Result Date: 11/22/2016 CLINICAL DATA:  Cough and shortness of breath. Bronchitis. Chest pain. EXAM: CT ANGIOGRAPHY CHEST WITH CONTRAST TECHNIQUE: Multidetector CT imaging of the chest was performed using the standard protocol during bolus administration of intravenous contrast. Multiplanar CT image reconstructions and MIPs were obtained to evaluate the vascular anatomy. CONTRAST:  100 mL of Isovue 370 COMPARISON:  Chest x-ray from earlier today and chest CT September 03, 2016 FINDINGS: Cardiovascular: Mild cardiomegaly identified. The known 4.2 cm ascending thoracic aortic aneurysm is stable. This was more completely described on the September 03, 2016 CT scan of the chest. No dissection. Minimal atherosclerotic change. Evaluation of the arch is limited due to streak artifact off the SVC and the left superior inner costal vein. No pulmonary emboli identified. Mediastinum/Nodes: No enlarged mediastinal, hilar, or axillary lymph nodes. Thyroid gland, trachea, and esophagus demonstrate no significant findings. Lungs/Pleura: Central airways are normal. No pneumothorax. Mild bibasilar atelectasis. No suspicious nodules or masses. Upper Abdomen: The patient's known right hepatic lobe hemangioma was better seen on previous imaging. No other  acute abnormalities in the upper abdomen. Musculoskeletal: No chest wall abnormality. No acute or significant osseous findings. Review of the MIP images confirms the above findings. IMPRESSION: 1. No pulmonary emboli. 2. Known thoracic aortic aneurysm, better described on the September 03, 2016 study. No dissection today. Aortic aneurysm NOS (ICD10-I71.9). Aortic Atherosclerosis (ICD10-I70.0). Electronically Signed   By: Dorise Bullion III M.D   On: 11/22/2016 18:53    Cardiac Studies    LHC 9/37/90: LV end diastolic pressure is normal.  Mid Cx lesion, 30 %stenosed.  Prox RCA lesion, 20 %stenosed.  Prox LAD to Mid LAD lesion, 85 %stenosed.  A STENT PROMUS PREM MR 3.5X32  drug eluting stent was successfully placed.  Post intervention, there is a 0% residual stenosis.  1st Mrg lesion, 90 %stenosed.  A STENT PROMUS PREM MR 2.5X16 drug eluting stent was successfully placed.  Post intervention, there is a 0% residual stenosis.   1. 2 vessel obstructive CAD    - 85% segmental mid LAD    - 90% mid OM1.  2. Normal LVEDP 3. Successful stenting of the mid LAD with DES 4. Successful stenting of the mid OM1 with DES  Echo 07/12/15: Study Conclusions  - Left ventricle: The cavity size was at the upper limits of   normal. Wall thickness was increased in a pattern of moderate   LVH. The estimated ejection fraction was 35%. Diffuse   hypokinesis. Doppler parameters are consistent with abnormal left   ventricular relaxation (grade 1 diastolic dysfunction). - Ventricular septum: Septal motion showed abnormal function and   dyssynergy. - Aortic valve: Moderately calcified annulus. Trileaflet. There was   mild regurgitation. - Mitral valve: Mildly thickened leaflets . There was mild   regurgitation. - Left atrium: The atrium was mildly dilated. - Right atrium: Central venous pressure (est): 3 mm Hg. - Atrial septum: There was increased thickness of the septum,   consistent with lipomatous  hypertrophy. No defect or patent   foramen ovale was identified. - Tricuspid valve: There was trivial regurgitation. - Pulmonary arteries: Systolic pressure could not be accurately   estimated. - Pericardium, extracardiac: A small pericardial effusion was   identified posterior to the heart and circumferential to the   heart.  Impressions:  - Moderate LVH with upper normal LV chamber size and LVEF   approximately 35%. There is diffuse hypokinesis with septal   dyssynergy. Grade 1 diastolic dysfunction. Mild left atrial   enlargement. Mildly thickened mitral leaflets with mild mitral   regurgitation. Mild aortic regurgitation. Trivial tricuspid   regurgitation. Small circumferential pericardial effusion.  Patient Profile     81 y.o. female with chronic systolic and diastolic heart failure (non-ischemic), hypertension, LBBB, and thoracic aortic aneurysm here with NSTEMI.    Assessment & Plan    # NSTEMI: [name] underwent successful PCI of the mid LAD and OM1 on 11/23/16. Continue aspirin, ticagrelor, carvedilol, and atorvastatin.  Atorvastatin was started this admission.  She will need lipids and a CMP in 6 weeks.   # Chronic systolic and diastolic heart failure: She appears euvolemic.  Continue carvedilol and Entresto.  LVEDP was 13 mmHg on cath.  Check echocardiogram given NSTEMI and shortness of breath.    # COPD:  Currently being treated for COPD exacerbation.  She reports shortness of breath with exertion that I suspect is more related to her COPD than heart failure.  Echo as above.    OK for discharge home after echo.   Signed, Skeet Latch, MD  11/24/2016, 10:52 AM

## 2016-11-24 NOTE — Progress Notes (Signed)
Benefit check submitted and copied to Waunita Schooner RN CM for 2C. Patient provided with 30 day Brilinta card to present at pharmacy. Patient verbalized understanding for use and to ask at pharmacy what cost will be without coupon if discharged over the weekend. Patient has coverage through [facility] and Medicaid.

## 2016-11-26 ENCOUNTER — Telehealth: Payer: Self-pay

## 2016-11-26 ENCOUNTER — Encounter (HOSPITAL_COMMUNITY): Payer: Self-pay | Admitting: Cardiology

## 2016-11-26 NOTE — Telephone Encounter (Signed)
Outreach made to Pt for TCM follow up.  Call went to VM.  Left this nurse name and # for call back. Arranged TCM visit with [facility]-scheduled for 12/04/2016 @ 2:00 pm with Bunnie Domino at [place] office.

## 2016-11-26 NOTE — Telephone Encounter (Signed)
Patient contacted regarding discharge from [facility] on 11/25/2016.  Patient understands to follow up with provider Bunnie Domino on 12/04/2016 at 2 pm at [facility]. Patient understands discharge instructions? yes Patient understands medications and regiment? yes Patient understands to bring all medications to this visit? yes  Pt returned this nurses call.  Pt states she was just getting back from picking up groceries.  Pt states she feels a little sob and has a twinge in her left rib cage from time to time, but she suggests she might be overdoing it a little.  This nurse told her I was surprised she already went to the grocery store.  Pt states her niece did all of the lifting of the groceries.  Pt states she will try to take it a little easier.  Pt states she has all of her new medications.  Pt denies any educational needs at this time.

## 2016-11-26 NOTE — Telephone Encounter (Signed)
-----   Message from [place], [place] sent at 11/24/2016  5:05 PM EDT ----- Please schedule this patient for a follow-up appointment.  Primary Cardiologist: Dr. Jacinta Shoe at [place] Date of Discharge: 11/24/2016 Appointment Needed Within: 7-10 days Appointment Type: TCM  Thank you! Robbie Lis, PA-C

## 2016-12-03 ENCOUNTER — Other Ambulatory Visit: Payer: Self-pay | Admitting: Cardiovascular Disease

## 2016-12-04 ENCOUNTER — Ambulatory Visit (INDEPENDENT_AMBULATORY_CARE_PROVIDER_SITE_OTHER): Payer: Medicare Other | Admitting: Adult Health

## 2016-12-04 ENCOUNTER — Encounter: Payer: Self-pay | Admitting: Adult Health

## 2016-12-04 ENCOUNTER — Encounter: Payer: Self-pay | Admitting: *Deleted

## 2016-12-04 VITALS — BP 136/78 | HR 70 | Ht 62.0 in | Wt 164.0 lb

## 2016-12-04 DIAGNOSIS — I2510 Atherosclerotic heart disease of native coronary artery without angina pectoris: Secondary | ICD-10-CM

## 2016-12-04 DIAGNOSIS — I10 Essential (primary) hypertension: Secondary | ICD-10-CM

## 2016-12-04 DIAGNOSIS — I5042 Chronic combined systolic (congestive) and diastolic (congestive) heart failure: Secondary | ICD-10-CM

## 2016-12-04 MED ORDER — CLOPIDOGREL BISULFATE 75 MG PO TABS: 75.0000 mg | ORAL_TABLET | Freq: Every day | ORAL | 3 refills | Status: DC

## 2016-12-04 NOTE — Addendum Note (Signed)
Addended by: Lendon Colonel on: 12/04/2016 05:03 PM   Modules accepted: Level of Service

## 2016-12-04 NOTE — Patient Instructions (Signed)
Medication Instructions:  Stop Taking Brilinta Start Taking Plavix 75 mg Daily   Labwork: None  Testing/Procedures: None  Follow-Up: Your physician recommends that you schedule a follow-up appointment in: 3 Months with Dr. Bronson Ing    Any Other Special Instructions Will Be Listed Below (If Applicable). You have been referred to Cardiac Rehab   You have been given a letter to only drive to Dr. appt and cardiac rehab and not to lift over 20 lbs.      If you need a refill on your cardiac medications before your next appointment, please call your pharmacy.

## 2016-12-04 NOTE — Progress Notes (Signed)
Cardiology Office Note   Date:  12/04/2016   ID:  Javaya [place] Ost, DOB Oct 31, 1935, MRN [id number]  PCP:  Sinda Du, MD  Cardiologist: Bronson Ing  Chief Complaint  Patient presents with  . Coronary Artery Disease  . Hypertension      History of Present Illness: ILLENE SWEETING is a 81 y.o. female who presents for posthospital, after admission for chest pain weight with NSTEMI, hypertension, history of ascending aortic aneurysm, chronic combined systolic and diastolic heart failure, and hypertension. The patient underwent left heart cath on 11/23/2016, this revealed two-vessel obstructive CAD, 85% segmental mid LAD and 90% mid OM1, the patient had successful stenting of the mid LAD with drug-eluting stent along with successful stenting of the mid OM1 with drug-eluting stent.  CT scan of the chest was negative for PE, her thoracic aortic aneurysm was unchanged at 4.2 cm with no evidence of dissection. Prior to discharge the patient was continued on aspirin ticagrelor carvedilol and started on this atorvastatin. She is also treated for COPD exacerbation. She was continued on steroids and antibiotics.  She comes today with dyspnea. She states that she is noticed that more since she's come home fom the hospital. The patient is medically compliantt. She unfortunately has not  had an opportunity to rest at home. She drives her grandchildren and children around she is only when he was a car. She's become quite exhausted with this.   Past Medical History:  Diagnosis Date  . Anxiety   . Cervical disc disorder with myelopathy, unspecified cervical region   . Chronic systolic (congestive) heart failure ([place])   . COPD (chronic obstructive pulmonary disease) ([place])   . Essential hypertension   . Hemorrhoids   . Liver mass   . Lung, cysts, congenital    Left lung cyst  . Nephrolithiasis    Embedded  . Nonischemic cardiomyopathy ([place])    LVEF 35-40% 2015  . Osteoarthritis   . Thoracic  ascending aortic aneurysm ([facility])    4.3 cm April 2016    Past Surgical History:  Procedure Laterality Date  . Benign breast tumors    . CHOLECYSTECTOMY    . COLONOSCOPY    . COLONOSCOPY N/A 09/22/2014   Procedure: COLONOSCOPY;  Surgeon: Rogene Houston, MD;  Location: [facility];  Service: Endoscopy;  Laterality: N/A;  830 -- to be done in OR under fluoro  . Complete hysterectomy    . CORONARY STENT INTERVENTION N/A 11/23/2016   Procedure: Coronary Stent Intervention;  Surgeon: Martinique, Peter M, MD;  Location: [facility];  Service: Cardiovascular;  Laterality: N/A;  . LEFT HEART CATH AND CORONARY ANGIOGRAPHY N/A 11/23/2016   Procedure: Left Heart Cath and Coronary Angiography;  Surgeon: Martinique, Peter M, MD;  Location: [facility];  Service: Cardiovascular;  Laterality: N/A;  . TONSILLECTOMY AND ADENOIDECTOMY       Current Outpatient Prescriptions  Medication Sig Dispense Refill  . albuterol (PROVENTIL) (2.5 MG/3ML) 0.083% nebulizer solution Take 3 mLs (2.5 mg total) by nebulization every 6 (six) hours as needed for wheezing or shortness of breath. 75 mL 12  . aspirin EC 81 MG tablet Take 81 mg by mouth every morning.     Marland Kitchen atorvastatin (LIPITOR) 80 MG tablet Take 1 tablet (80 mg total) by mouth daily at 6 PM. 30 tablet 6  . carvedilol (COREG) 6.25 MG tablet TAKE ONE TABLET BY MOUTH 2 TIMES A DAY. 180 tablet 0  . clorazepate (TRANXENE) 7.5 MG tablet  Take 7.5 mg by mouth daily as needed for anxiety. For nerves    . nitroGLYCERIN (NITROSTAT) 0.4 MG SL tablet Place 1 tablet (0.4 mg total) under the tongue every 5 (five) minutes x 3 doses as needed for chest pain. 25 tablet 12  . PROAIR HFA 108 (90 BASE) MCG/ACT inhaler Inhale 2 puffs into the lungs every 6 (six) hours as needed for wheezing or shortness of breath.     . sacubitril-valsartan (ENTRESTO) 24-26 MG Take 1 tablet by mouth 2 (two) times daily. 60 tablet 6  . traMADol (ULTRAM) 50 MG tablet Take 50 mg by mouth daily as  needed for moderate pain or severe pain. Maximum dose= 8 tablets per day    . clopidogrel (PLAVIX) 75 MG tablet Take 1 tablet (75 mg total) by mouth daily. 90 tablet 3   No current facility-administered medications for this visit.     Allergies:   Alprazolam; Codeine; Percodan [oxycodone-aspirin]; and Valium    Social History:  The patient  reports that she quit smoking about 8 years ago. Her smoking use included Cigarettes. She started smoking about 39 years ago. She has a 7.75 pack-year smoking history. She has never used smokeless tobacco. She reports that she does not drink alcohol or use drugs.   Family History:  The patient's family history includes Aneurysm in her father; Diabetes in her brother; Heart disease in her brother, mother, and sister; Lung cancer in her brother.    ROS: All other systems are reviewed and negative. Unless otherwise mentioned in H&P    PHYSICAL EXAM: VS:  BP 136/78   Pulse 70   Ht 5\' 2"  (1.575 m)   Wt 164 lb (74.4 kg)   SpO2 93%   BMI 30.00 kg/m  , BMI Body mass index is 30 kg/m. GEN: Well nourished, well developed, in no acute distress  HEENT: normal  Neck: no JVD, carotid bruits, or masses Cardiac: RRR; no murmurs, rubs, or gallops,no edema  Respiratory:  clear to auscultation bilaterally, normal work of breathing GI: soft, nontender, nondistended, + BS MS: no deformity or atrophy Right wrist is well healed from catheterization.  Skin: warm and dry, no rash Neuro:  Strength and sensation are intact Psych: euthymic mood, full affect   Recent Labs: 10/31/2016: ALT 6 11/24/2016: BUN 13; Creatinine, Ser 0.73; Hemoglobin 14.0; Platelets 322; Potassium 3.9; Sodium 137    Lipid Panel    Component Value Date/Time   CHOL 204 (H) 11/23/2016 0737   TRIG 50 11/23/2016 0737   HDL 79 11/23/2016 0737   CHOLHDL 2.6 11/23/2016 0737   VLDL 10 11/23/2016 0737   LDLCALC 115 (H) 11/23/2016 0737      Wt Readings from Last 3 Encounters:  12/04/16 164  lb (74.4 kg)  11/24/16 163 lb 5.8 oz (74.1 kg)  10/31/16 170 lb 11.2 oz (77.4 kg)      Other studies Reviewed:  LHC 1/95/09: LV end diastolic pressure is normal.  Mid Cx lesion, 30 %stenosed.  Prox RCA lesion, 20 %stenosed.  Prox LAD to Mid LAD lesion, 85 %stenosed.  A STENT PROMUS PREM MR 3.5X32 drug eluting stent was successfully placed.  Post intervention, there is a 0% residual stenosis.  1st Mrg lesion, 90 %stenosed.  A STENT PROMUS PREM MR 2.5X16 drug eluting stent was successfully placed.  Post intervention, there is a 0% residual stenosis.  1. 2 vessel obstructive CAD - 85% segmental mid LAD - 90% mid OM1.  2. Normal LVEDP 3. Successful  stenting of the mid LAD with DES 4. Successful stenting of the mid OM1 with DES   Echo 11/24/16 Study Conclusions  - Left ventricle: The cavity size was normal. There was mild concentric hypertrophy. Systolic function was mildly to moderately reduced. The estimated ejection fraction was in the range of 40% to 45%. Diffuse hypokinesis worse in the antero and inferoseptum. Doppler parameters are consistent with abnormal left ventricular relaxation (grade 1 diastolic dysfunction). Doppler parameters are consistent with indeterminate ventricular filling pressure. - Aortic valve: Transvalvular velocity was within the normal range. There was no stenosis. There was moderate regurgitation. Valve area (VTI): 1.6 cm^2. Valve area (Vmax): 1.6 cm^2. Valve area (Vmean): 1.59 cm^2. - Mitral valve: Transvalvular velocity was within the normal range. There was no evidence for stenosis. There was trivial regurgitation. - Right ventricle: The cavity size was normal. Wall thickness was normal. Systolic function was normal. - Atrial septum: No defect or patent foramen ovale was identified by color flow Doppler. - Tricuspid valve: There was no regurgitation.  ASSESSMENT AND PLAN:  1.  CAD:  Status  post drug-eluting stent to the LAD and circumflex. The patient has been placed on Brilinta and aspit tolerating is having significant dyspnea associated with this. I will change her to Plavix 75 mg daily. I have gone over all of her medications and reviewed her cardiac cath illustration with her and her niece. She verbalizes understanding. She will be referred to cardiac rehabilitation.  She is also advised not to drive for several hours a day as she has been doing as she has been driving around children and grandchildren to various activities and places between [place] several times a day. She is exhausted from this. I've advised her to only drive to her doctor's appointments and to cardiac rehabilitation when she is established over the next several months in order to allow herself to build her energy back up and to allow for healing. She verbalizes understanding.  2. Ischemic cardiomyopathy: EF is 4045%. She is currently on Entresto. Blood pressure is slightly elevated for EF, but she is quite anxious and upset concerning her recent diagnosis. We'll continue to monitor this. She is currently not on diuretics. No evidence of fluid overload.  3, Hypercholesterolemia: Continue statin therapy.    Current medicines are reviewed at length with the patient today.  I spent greater than 35 minutes with this patient and her niece going over all medications, discussing changes, and reviewing her catheterization illustration. I've answered numerous questions.  Labs/ tests ordered today include:  Phill Myron. [place] Pugh, ANP, AACC   12/04/2016 3:26 PM    [facility] [address] [place], [place] [address] Phone: [phone]; Fax: [phone]

## 2016-12-24 DIAGNOSIS — H903 Sensorineural hearing loss, bilateral: Secondary | ICD-10-CM | POA: Diagnosis not present

## 2016-12-28 DIAGNOSIS — M797 Fibromyalgia: Secondary | ICD-10-CM | POA: Diagnosis not present

## 2016-12-28 DIAGNOSIS — R768 Other specified abnormal immunological findings in serum: Secondary | ICD-10-CM | POA: Diagnosis not present

## 2016-12-28 DIAGNOSIS — M0609 Rheumatoid arthritis without rheumatoid factor, multiple sites: Secondary | ICD-10-CM | POA: Diagnosis not present

## 2016-12-28 DIAGNOSIS — M5136 Other intervertebral disc degeneration, lumbar region: Secondary | ICD-10-CM | POA: Diagnosis not present

## 2017-01-08 DIAGNOSIS — I2510 Atherosclerotic heart disease of native coronary artery without angina pectoris: Secondary | ICD-10-CM | POA: Diagnosis not present

## 2017-01-08 DIAGNOSIS — J441 Chronic obstructive pulmonary disease with (acute) exacerbation: Secondary | ICD-10-CM | POA: Diagnosis not present

## 2017-01-08 DIAGNOSIS — I5022 Chronic systolic (congestive) heart failure: Secondary | ICD-10-CM | POA: Diagnosis not present

## 2017-01-08 DIAGNOSIS — I10 Essential (primary) hypertension: Secondary | ICD-10-CM | POA: Diagnosis not present

## 2017-01-24 ENCOUNTER — Encounter (HOSPITAL_COMMUNITY): Admission: RE | Admit: 2017-01-24 | Payer: Medicare Other | Source: Ambulatory Visit

## 2017-02-07 ENCOUNTER — Encounter (HOSPITAL_COMMUNITY): Payer: Medicare Other

## 2017-02-12 ENCOUNTER — Emergency Department (HOSPITAL_COMMUNITY): Payer: Medicare Other

## 2017-02-12 ENCOUNTER — Encounter (HOSPITAL_COMMUNITY): Payer: Self-pay

## 2017-02-12 ENCOUNTER — Emergency Department (HOSPITAL_COMMUNITY)
Admission: EM | Admit: 2017-02-12 | Discharge: 2017-02-12 | Disposition: A | Discharge status: Home / Self Care | Payer: Medicare Other | Attending: Emergency Medicine | Admitting: Emergency Medicine

## 2017-02-12 DIAGNOSIS — I10 Essential (primary) hypertension: Secondary | ICD-10-CM | POA: Insufficient documentation

## 2017-02-12 DIAGNOSIS — M545 Low back pain, unspecified: Secondary | ICD-10-CM

## 2017-02-12 DIAGNOSIS — M25572 Pain in left ankle and joints of left foot: Secondary | ICD-10-CM | POA: Diagnosis not present

## 2017-02-12 DIAGNOSIS — Y92017 Garden or yard in single-family (private) house as the place of occurrence of the external cause: Secondary | ICD-10-CM | POA: Insufficient documentation

## 2017-02-12 DIAGNOSIS — W1842XA Slipping, tripping and stumbling without falling due to stepping into hole or opening, initial encounter: Secondary | ICD-10-CM | POA: Insufficient documentation

## 2017-02-12 DIAGNOSIS — Z8719 Personal history of other diseases of the digestive system: Secondary | ICD-10-CM | POA: Diagnosis not present

## 2017-02-12 DIAGNOSIS — W19XXXA Unspecified fall, initial encounter: Secondary | ICD-10-CM

## 2017-02-12 DIAGNOSIS — S8992XA Unspecified injury of left lower leg, initial encounter: Secondary | ICD-10-CM | POA: Diagnosis not present

## 2017-02-12 DIAGNOSIS — Y999 Unspecified external cause status: Secondary | ICD-10-CM | POA: Insufficient documentation

## 2017-02-12 DIAGNOSIS — Z7982 Long term (current) use of aspirin: Secondary | ICD-10-CM | POA: Diagnosis not present

## 2017-02-12 DIAGNOSIS — J449 Chronic obstructive pulmonary disease, unspecified: Secondary | ICD-10-CM | POA: Diagnosis not present

## 2017-02-12 DIAGNOSIS — M25562 Pain in left knee: Secondary | ICD-10-CM | POA: Diagnosis not present

## 2017-02-12 DIAGNOSIS — Z79899 Other long term (current) drug therapy: Secondary | ICD-10-CM | POA: Diagnosis not present

## 2017-02-12 HISTORY — DX: Acute myocardial infarction, unspecified: I21.9

## 2017-02-12 NOTE — ED Provider Notes (Signed)
[facility] DEPT Provider Note   CSN: [id number] Arrival date & time: 02/12/17  1001     History   Chief Complaint Chief Complaint  Patient presents with  . Fall    HPI [name] is a 81 y.o. female.  Accidental trip and fall in a hole in her yard last night. No head or neck trauma. No change in behavior. She complains of low back, left knee, left ankle pain. She is ambulatory but it is painful. Severity of symptoms is moderate.      Past Medical History:  Diagnosis Date  . Anxiety   . Cervical disc disorder with myelopathy, unspecified cervical region   . Chronic systolic (congestive) heart failure ([place])   . COPD (chronic obstructive pulmonary disease) ([place])   . Essential hypertension   . Hemorrhoids   . Liver mass   . Lung, cysts, congenital    Left lung cyst  . Myocardial infarction ([place])   . Nephrolithiasis    Embedded  . Nonischemic cardiomyopathy ([place])    LVEF 35-40% 2015  . Osteoarthritis   . Thoracic ascending aortic aneurysm ([facility])    4.3 cm April 2016    Patient Active Problem List   Diagnosis Date Noted  . NSTEMI (non-ST elevated myocardial infarction) ([place]) 11/23/2016  . Hypertensive cardiovascular disease 10/03/2015  . Protein-calorie malnutrition, severe 07/13/2015  . Demand ischemia of myocardium ([place]) 07/13/2015  . Nonischemic cardiomyopathy ([place])   . Atypical chest pain   . Left bundle branch block   . COPD exacerbation ([place]) 07/11/2015  . Flu-like symptoms 07/11/2015  . Elevated troponin 07/11/2015  . Acute bronchitis 07/11/2015  . GERD (gastroesophageal reflux disease) 07/11/2015  . Bronchospasm   . Ascending aortic aneurysm ([place]) 01/27/2015  . Chronic combined systolic and diastolic CHF (congestive heart failure) ([place]) 01/27/2015  . Chest pain at rest 01/27/2015  . Essential hypertension 01/27/2015  . Anxiety 01/27/2015  . Chronic pain 01/27/2015  . Chest pain 08/20/2013  . Hypertension 08/20/2013  . Contusion of left knee  08/07/2012  . Sprain of wrist 08/07/2012  . Liver mass 05/21/2011  . Aneurysm ([place]) 05/21/2011  . Abdominal pain 05/21/2011  . Bronchitis 05/21/2011  . De Quervain's disease (tenosynovitis) 04/02/2011  . SHOULDER PAIN 01/19/2009  . CERVICALGIA 01/19/2009  . IMPINGEMENT SYNDROME 01/19/2009    Past Surgical History:  Procedure Laterality Date  . Benign breast tumors    . CHOLECYSTECTOMY    . COLONOSCOPY    . COLONOSCOPY N/A 09/22/2014   Procedure: COLONOSCOPY;  Surgeon: Rogene Houston, MD;  Location: [facility];  Service: Endoscopy;  Laterality: N/A;  830 -- to be done in OR under fluoro  . Complete hysterectomy    . CORONARY STENT INTERVENTION N/A 11/23/2016   Procedure: Coronary Stent Intervention;  Surgeon: Martinique, Peter M, MD;  Location: [facility] LAB;  Service: Cardiovascular;  Laterality: N/A;  . LEFT HEART CATH AND CORONARY ANGIOGRAPHY N/A 11/23/2016   Procedure: Left Heart Cath and Coronary Angiography;  Surgeon: Martinique, Peter M, MD;  Location: [facility];  Service: Cardiovascular;  Laterality: N/A;  . TONSILLECTOMY AND ADENOIDECTOMY      OB History    No data available       Home Medications    Prior to Admission medications   Medication Sig Start Date End Date Taking? Authorizing Provider  aspirin EC 81 MG tablet Take 81 mg by mouth every morning.    Yes [provider]  atorvastatin (LIPITOR)  80 MG tablet Take 1 tablet (80 mg total) by mouth daily at 6 PM. 11/24/16  Yes Bhagat, Bhavinkumar, PA  carvedilol (COREG) 6.25 MG tablet TAKE ONE TABLET BY MOUTH 2 TIMES A DAY. 12/03/16  Yes Herminio Commons, MD  clopidogrel (PLAVIX) 75 MG tablet Take 1 tablet (75 mg total) by mouth daily. 12/04/16  Yes Lendon Colonel, NP  clorazepate (TRANXENE) 7.5 MG tablet Take 7.5 mg by mouth daily as needed for anxiety. For nerves   Yes [provider]  naproxen (NAPROSYN) 500 MG tablet Take 1 tablet by mouth 2 (two) times daily. 12/11/16  Yes  [provider]  PROAIR HFA 108 (90 BASE) MCG/ACT inhaler Inhale 2 puffs into the lungs every 6 (six) hours as needed for wheezing or shortness of breath.  02/08/14  Yes [provider]  sacubitril-valsartan (ENTRESTO) 24-26 MG Take 1 tablet by mouth 2 (two) times daily. 08/29/16  Yes Herminio Commons, MD  traMADol (ULTRAM) 50 MG tablet Take 50 mg by mouth daily as needed for moderate pain or severe pain. Maximum dose= 8 tablets per day   Yes [provider]  albuterol (PROVENTIL) (2.5 MG/3ML) 0.083% nebulizer solution Take 3 mLs (2.5 mg total) by nebulization every 6 (six) hours as needed for wheezing or shortness of breath. 07/13/15   Sinda Du, MD  nitroGLYCERIN (NITROSTAT) 0.4 MG SL tablet Place 1 tablet (0.4 mg total) under the tongue every 5 (five) minutes x 3 doses as needed for chest pain. 11/24/16   Leanor Kail, PA    Family History Family History  Problem Relation Age of Onset  . Heart disease Mother   . Aneurysm Father   . Lung cancer Brother   . Heart disease Sister   . Diabetes Brother   . Heart disease Brother     Social History Social History  Substance Use Topics  . Smoking status: Former Smoker    Packs/day: 0.25    Years: 31.00    Types: Cigarettes    Start date: 05/14/1977    Quit date: 05/28/2008  . Smokeless tobacco: Never Used  . Alcohol use No     Allergies   Alprazolam; Codeine; Percodan [oxycodone-aspirin]; and Valium   Review of Systems Review of Systems  All other systems reviewed and are negative.    Physical Exam Updated Vital Signs BP 123/75   Pulse 63   Temp 98.3 F (36.8 C) (Oral)   Resp 18   Ht 5\' 2"  (1.575 m)   Wt 77.1 kg (170 lb)   SpO2 95%   BMI 31.09 kg/m   Physical Exam  Constitutional: She is oriented to person, place, and time. She appears well-developed and well-nourished.  HENT:  Head: Normocephalic and atraumatic.  Eyes: Conjunctivae are normal.  Neck: Neck supple.    Cardiovascular: Normal rate and regular rhythm.   Pulmonary/Chest: Effort normal and breath sounds normal.  Abdominal: Soft. Bowel sounds are normal.  Musculoskeletal:  Minimal lumbar, left knee, left ankle tenderness.  Pain with range of motion.  Neurological: She is alert and oriented to person, place, and time.  Skin: Skin is warm and dry.  Psychiatric: She has a normal mood and affect. Her behavior is normal.  Nursing note and vitals reviewed.    ED Treatments / Results  Labs (all labs ordered are listed, but only abnormal results are displayed) Labs Reviewed - No data to display  EKG  EKG Interpretation None       Radiology Dg  Lumbar Spine Complete  Result Date: 02/12/2017 CLINICAL DATA:  The patient stepped in a hole in the ER last night. Low back pain. EXAM: LUMBAR SPINE - COMPLETE 4+ VIEW COMPARISON:  Coronal and sagittal CT images through the lumbar spine from a scan of October 27, 2014 FINDINGS: S1 is transitional. The lumbar vertebral bodies are preserved in height. There is moderate disc space narrowing at L2-3 with milder narrowing at L3-4, L4-5, and L5-S1. There is a large anterior bridging osteophyte at T12-L1. There is no spondylolisthesis. There is facet joint hypertrophy from L3-4 through S1-S2. The pedicles and transverse processes are intact where visualized. There is calcification in the wall of the abdominal aorta. IMPRESSION: Multilevel degenerative disc disease. No compression fracture or spondylolisthesis. Abdominal aortic atherosclerosis. Electronically Signed   By: David  Martinique M.D.   On: 02/12/2017 12:17   Dg Ankle Complete Left  Result Date: 02/12/2017 CLINICAL DATA:  Left in a hole last night.  Persistent ankle pain. EXAM: LEFT ANKLE COMPLETE - 3+ VIEW COMPARISON:  None in PACs FINDINGS: The bones are subjectively mildly osteopenic. The ankle joint mortise is preserved. The talar dome is intact. There are plantar and Achilles region calcaneal spurs. There  is no acute malleolar fracture. There are plantar and Achilles region calcaneal spurs. There is mild soft tissue swelling anteriorly. IMPRESSION: No acute fracture nor dislocation is observed. Electronically Signed   By: David  Martinique M.D.   On: 02/12/2017 12:12   Dg Knee Complete 4 Views Left  Result Date: 02/12/2017 CLINICAL DATA:  A stepped in a hole last night and has persistent pain in the left knee and ankle as well as low back pain. EXAM: LEFT KNEE - COMPLETE 4+ VIEW COMPARISON:  Left knee series dated May 11, 2014 FINDINGS: The bones are subjectively mildly osteopenic. There is narrowing of the medial joint compartment. There is beaking of the tibial spines. A small spur arises from the periphery of the articular margin of the lateral tibial plateau. The femoral condyles appear intact. There is no acute fracture or joint effusion. IMPRESSION: Mild degenerative narrowing of the medial joint compartment. Mild degenerative spurring as described. No acute fracture nor dislocation. Electronically Signed   By: David  Martinique M.D.   On: 02/12/2017 12:11    Procedures Procedures (including critical care time)  Medications Ordered in ED Medications - No data to display   Initial Impression / Assessment and Plan / ED Course  I have reviewed the triage vital signs and the nursing notes.  Pertinent labs & imaging results that were available during my care of the patient were reviewed by me and considered in my medical decision making (see chart for details).     Patient is in no acute distress. Plain films of lumbar spine, left knee, left ankle show no acute fracture. This was discussed with the patient and her daughter.  Final Clinical Impressions(s) / ED Diagnoses   Final diagnoses:  Fall, initial encounter  Acute midline low back pain without sciatica  Acute pain of left knee  Acute left ankle pain    New Prescriptions New Prescriptions   No medications on file     Nat Christen, MD 02/12/17 1455

## 2017-02-12 NOTE — ED Triage Notes (Addendum)
Pt reports stepped in a hole in the yard last night and fell.  C/O pain in left ankle, lower back, and left knee.

## 2017-02-12 NOTE — Discharge Instructions (Signed)
X-rays show no broken bones. You will be sore for several days. Ice pack, ankle brace, Tylenol for pain.

## 2017-02-26 DIAGNOSIS — J441 Chronic obstructive pulmonary disease with (acute) exacerbation: Secondary | ICD-10-CM | POA: Diagnosis not present

## 2017-02-26 DIAGNOSIS — F419 Anxiety disorder, unspecified: Secondary | ICD-10-CM | POA: Diagnosis not present

## 2017-02-26 DIAGNOSIS — I10 Essential (primary) hypertension: Secondary | ICD-10-CM | POA: Diagnosis not present

## 2017-02-26 DIAGNOSIS — I5022 Chronic systolic (congestive) heart failure: Secondary | ICD-10-CM | POA: Diagnosis not present

## 2017-03-07 ENCOUNTER — Ambulatory Visit: Payer: Medicare Other | Admitting: Cardiovascular Disease

## 2017-03-25 ENCOUNTER — Encounter: Payer: Self-pay | Admitting: Cardiovascular Disease

## 2017-03-25 ENCOUNTER — Ambulatory Visit (INDEPENDENT_AMBULATORY_CARE_PROVIDER_SITE_OTHER): Payer: Medicare Other | Admitting: Cardiovascular Disease

## 2017-03-25 VITALS — BP 146/82 | HR 72 | Ht 62.0 in | Wt 168.0 lb

## 2017-03-25 DIAGNOSIS — I209 Angina pectoris, unspecified: Secondary | ICD-10-CM | POA: Diagnosis not present

## 2017-03-25 DIAGNOSIS — I10 Essential (primary) hypertension: Secondary | ICD-10-CM

## 2017-03-25 DIAGNOSIS — I5042 Chronic combined systolic (congestive) and diastolic (congestive) heart failure: Secondary | ICD-10-CM

## 2017-03-25 DIAGNOSIS — I429 Cardiomyopathy, unspecified: Secondary | ICD-10-CM

## 2017-03-25 DIAGNOSIS — I712 Thoracic aortic aneurysm, without rupture: Secondary | ICD-10-CM

## 2017-03-25 DIAGNOSIS — I7121 Aneurysm of the ascending aorta, without rupture: Secondary | ICD-10-CM

## 2017-03-25 DIAGNOSIS — I25118 Atherosclerotic heart disease of native coronary artery with other forms of angina pectoris: Secondary | ICD-10-CM | POA: Diagnosis not present

## 2017-03-25 DIAGNOSIS — I2510 Atherosclerotic heart disease of native coronary artery without angina pectoris: Secondary | ICD-10-CM

## 2017-03-25 DIAGNOSIS — Z955 Presence of coronary angioplasty implant and graft: Secondary | ICD-10-CM | POA: Diagnosis not present

## 2017-03-25 MED ORDER — PRASUGREL HCL 10 MG PO TABS: 10.0000 mg | ORAL_TABLET | Freq: Every day | ORAL | 3 refills | Status: AC

## 2017-03-25 NOTE — Patient Instructions (Signed)
Medication Instructions:  START EFFIENT 10 MG DAILY   Labwork: NONE  Testing/Procedures: NONE  Follow-Up: Your physician wants you to follow-up in: 6 MONTHS .  You will receive a reminder letter in the mail two months in advance. If you don't receive a letter, please call our office to schedule the follow-up appointment.   Any Other Special Instructions Will Be Listed Below (If Applicable). PLEASE DO NOT STOP MEDICATIONS WITHOUT CONTACTING THE OFFICE FIRST.    If you need a refill on your cardiac medications before your next appointment, please call your pharmacy.

## 2017-03-25 NOTE — Progress Notes (Signed)
SUBJECTIVE: The patient presents for routine follow-up.  She underwent drug-eluting stent placement to the proximal and mid LAD as well as the first marginal branch on 11/23/16.  The  Echocardiogram 11/24/16: Mildly reduced left ventricular systolic function, LVEF 41-28%, diffuse hypokinesis, grade 1 diastolic dysfunction, moderate aortic regurgitation.  She saw K. Lawrence DNP on 12/04/16 and was complaining of dyspnea.  Brilinta was switched to Plavix.  She quit taking Plavix in July due to itching but did not notify anyone.  She denies chest pain.  She has stable chronic exertional dyspnea from COPD.  Her primary complaints relate to inability to raise her left arm above her head due to rotator cuff problems.  She denies orthopnea and leg swelling.    Review of Systems: As per "subjective", otherwise negative.  Allergies  Allergen Reactions  . Plavix [Clopidogrel Bisulfate]     Severe itching  . Alprazolam Nausea And Vomiting  . Codeine Nausea And Vomiting  . Percodan [Oxycodone-Aspirin] Nausea And Vomiting  . Valium Nausea And Vomiting    Current Outpatient Medications  Medication Sig Dispense Refill  . albuterol (PROVENTIL) (2.5 MG/3ML) 0.083% nebulizer solution Take 3 mLs (2.5 mg total) by nebulization every 6 (six) hours as needed for wheezing or shortness of breath. 75 mL 12  . aspirin EC 81 MG tablet Take 81 mg by mouth every morning.     Marland Kitchen atorvastatin (LIPITOR) 80 MG tablet Take 1 tablet (80 mg total) by mouth daily at 6 PM. 30 tablet 6  . carvedilol (COREG) 6.25 MG tablet TAKE ONE TABLET BY MOUTH 2 TIMES A DAY. 180 tablet 0  . clorazepate (TRANXENE) 7.5 MG tablet Take 7.5 mg by mouth daily as needed for anxiety. For nerves    . naproxen (NAPROSYN) 500 MG tablet Take 1 tablet by mouth 2 (two) times daily.    . nitroGLYCERIN (NITROSTAT) 0.4 MG SL tablet Place 1 tablet (0.4 mg total) under the tongue every 5 (five) minutes x 3 doses as needed for chest pain. 25 tablet  12  . PROAIR HFA 108 (90 BASE) MCG/ACT inhaler Inhale 2 puffs into the lungs every 6 (six) hours as needed for wheezing or shortness of breath.     . sacubitril-valsartan (ENTRESTO) 24-26 MG Take 1 tablet by mouth 2 (two) times daily. 60 tablet 6  . traMADol (ULTRAM) 50 MG tablet Take 50 mg by mouth daily as needed for moderate pain or severe pain. Maximum dose= 8 tablets per day     No current facility-administered medications for this visit.     Past Medical History:  Diagnosis Date  . Anxiety   . Cervical disc disorder with myelopathy, unspecified cervical region   . Chronic systolic (congestive) heart failure ([place])   . COPD (chronic obstructive pulmonary disease) ([place])   . Essential hypertension   . Hemorrhoids   . Liver mass   . Lung, cysts, congenital    Left lung cyst  . Myocardial infarction ([place])   . Nephrolithiasis    Embedded  . Nonischemic cardiomyopathy ([place])    LVEF 35-40% 2015  . Osteoarthritis   . Thoracic ascending aortic aneurysm ([facility])    4.3 cm April 2016    Past Surgical History:  Procedure Laterality Date  . Benign breast tumors    . CHOLECYSTECTOMY    . COLONOSCOPY    . Complete hysterectomy    . TONSILLECTOMY AND ADENOIDECTOMY      Social History   Socioeconomic History  .  Marital status: Widowed    Spouse name: Not on file  . Number of children: Not on file  . Years of education: 9th  . Highest education level: Not on file  Social Needs  . Financial resource strain: Not on file  . Food insecurity - worry: Not on file  . Food insecurity - inability: Not on file  . Transportation needs - medical: Not on file  . Transportation needs - non-medical: Not on file  Occupational History    Employer: RETIRED  Tobacco Use  . Smoking status: Former Smoker    Packs/day: 0.25    Years: 31.00    Pack years: 7.75    Types: Cigarettes    Start date: 05/14/1977    Last attempt to quit: 05/28/2008    Years since quitting: 8.8  . Smokeless tobacco:  Never Used  Substance and Sexual Activity  . Alcohol use: No    Alcohol/week: 0.0 oz  . Drug use: No  . Sexual activity: No  Other Topics Concern  . Not on file  Social History Narrative  . Not on file     Vitals:   03/25/17 1314  BP: (!) 146/82  Pulse: 72  SpO2: 98%  Weight: 168 lb (76.2 kg)  Height: 5\' 2"  (1.575 m)    Wt Readings from Last 3 Encounters:  03/25/17 168 lb (76.2 kg)  02/12/17 170 lb (77.1 kg)  12/04/16 164 lb (74.4 kg)     PHYSICAL EXAM General: NAD HEENT: Normal. Neck: No JVD, no thyromegaly. Lungs: Clear to auscultation bilaterally with normal respiratory effort. CV: Regular rate and rhythm, normal S1/S2, no S3/S4, no murmur. No pretibial or periankle edema.  No carotid bruit.   Abdomen: Soft, nontender, no distention.  Neurologic: Alert and oriented.  Psych: Normal affect. Skin: Normal. Musculoskeletal: No gross deformities.    ECG: Most recent ECG reviewed.   Labs: Lab Results  Component Value Date/Time   K 3.9 11/24/2016 04:13 AM   BUN 13 11/24/2016 04:13 AM   CREATININE 0.73 11/24/2016 04:13 AM   CREATININE 0.72 08/29/2016 12:42 PM   ALT 6 10/31/2016 11:01 AM   HGB 14.0 11/24/2016 04:13 AM     Lipids: Lab Results  Component Value Date/Time   LDLCALC 115 (H) 11/23/2016 07:37 AM   CHOL 204 (H) 11/23/2016 07:37 AM   TRIG 50 11/23/2016 07:37 AM   HDL 79 11/23/2016 07:37 AM       ASSESSMENT AND PLAN:  1.  Coronary artery disease with stenting of the LAD and OM1 in July 2018: Symptomatically stable.  I emphasized the importance of taking medications as prescribed including dual antiplatelet therapy.  Brilinta presumably led to increased shortness of breath from baseline.  She was switched to Plavix but this led to severe itching.  I will try Effient 10 mg daily.  Continue carvedilol and Lipitor.  She is on an angiotensin receptor blocker given her history of non-STEMI.  2.  Chronic hypertension: Blood pressure is mildly elevated.   I will monitor this to see if further antihypertensive titration is warranted.  3.  Thoracic aortic aneurysm: Stable at 4.2 cm on 11/22/16.  I will repeat in 1 year.  4.  Chronic systolic heart failure: Euvolemic and stable.  Continue carvedilol and Entresto.  I will aim to control blood pressure.  No diuretic requirement at this time.   Disposition: Follow up 6 months   Kate Sable, M.D., F.A.C.C.

## 2017-04-01 ENCOUNTER — Observation Stay (HOSPITAL_COMMUNITY)
Admission: EM | Admit: 2017-04-01 | Discharge: 2017-04-03 | Disposition: A | Discharge status: Home / Self Care | Payer: Medicare Other | Attending: Pulmonary Disease | Admitting: Pulmonary Disease

## 2017-04-01 ENCOUNTER — Emergency Department (HOSPITAL_COMMUNITY): Payer: Medicare Other

## 2017-04-01 ENCOUNTER — Encounter (HOSPITAL_COMMUNITY): Payer: Self-pay | Admitting: Cardiology

## 2017-04-01 DIAGNOSIS — I2510 Atherosclerotic heart disease of native coronary artery without angina pectoris: Secondary | ICD-10-CM | POA: Diagnosis present

## 2017-04-01 DIAGNOSIS — I7121 Aneurysm of the ascending aorta, without rupture: Secondary | ICD-10-CM | POA: Diagnosis present

## 2017-04-01 DIAGNOSIS — Z9861 Coronary angioplasty status: Secondary | ICD-10-CM

## 2017-04-01 DIAGNOSIS — M797 Fibromyalgia: Secondary | ICD-10-CM | POA: Diagnosis not present

## 2017-04-01 DIAGNOSIS — J449 Chronic obstructive pulmonary disease, unspecified: Secondary | ICD-10-CM | POA: Diagnosis not present

## 2017-04-01 DIAGNOSIS — Z23 Encounter for immunization: Secondary | ICD-10-CM | POA: Insufficient documentation

## 2017-04-01 DIAGNOSIS — K219 Gastro-esophageal reflux disease without esophagitis: Secondary | ICD-10-CM | POA: Diagnosis present

## 2017-04-01 DIAGNOSIS — Z79899 Other long term (current) drug therapy: Secondary | ICD-10-CM | POA: Insufficient documentation

## 2017-04-01 DIAGNOSIS — R768 Other specified abnormal immunological findings in serum: Secondary | ICD-10-CM | POA: Diagnosis not present

## 2017-04-01 DIAGNOSIS — M542 Cervicalgia: Secondary | ICD-10-CM | POA: Diagnosis not present

## 2017-04-01 DIAGNOSIS — Z87891 Personal history of nicotine dependence: Secondary | ICD-10-CM | POA: Insufficient documentation

## 2017-04-01 DIAGNOSIS — Z955 Presence of coronary angioplasty implant and graft: Secondary | ICD-10-CM | POA: Diagnosis not present

## 2017-04-01 DIAGNOSIS — I5042 Chronic combined systolic (congestive) and diastolic (congestive) heart failure: Secondary | ICD-10-CM | POA: Insufficient documentation

## 2017-04-01 DIAGNOSIS — Z7982 Long term (current) use of aspirin: Secondary | ICD-10-CM | POA: Diagnosis not present

## 2017-04-01 DIAGNOSIS — M0609 Rheumatoid arthritis without rheumatoid factor, multiple sites: Secondary | ICD-10-CM | POA: Diagnosis not present

## 2017-04-01 DIAGNOSIS — I10 Essential (primary) hypertension: Secondary | ICD-10-CM | POA: Diagnosis present

## 2017-04-01 DIAGNOSIS — I110 Hypertensive heart disease with heart failure: Secondary | ICD-10-CM | POA: Diagnosis not present

## 2017-04-01 DIAGNOSIS — I252 Old myocardial infarction: Secondary | ICD-10-CM | POA: Insufficient documentation

## 2017-04-01 DIAGNOSIS — M5136 Other intervertebral disc degeneration, lumbar region: Secondary | ICD-10-CM | POA: Diagnosis not present

## 2017-04-01 DIAGNOSIS — R42 Dizziness and giddiness: Secondary | ICD-10-CM | POA: Diagnosis not present

## 2017-04-01 DIAGNOSIS — R110 Nausea: Secondary | ICD-10-CM | POA: Insufficient documentation

## 2017-04-01 DIAGNOSIS — I712 Thoracic aortic aneurysm, without rupture: Secondary | ICD-10-CM | POA: Diagnosis present

## 2017-04-01 DIAGNOSIS — F419 Anxiety disorder, unspecified: Secondary | ICD-10-CM

## 2017-04-01 LAB — CBC WITH DIFFERENTIAL/PLATELET
Basophils Absolute: 0 10*3/uL (ref 0.0–0.1)
Basophils Relative: 0 %
Eosinophils Absolute: 0.5 10*3/uL (ref 0.0–0.7)
Eosinophils Relative: 6 %
HCT: 41.5 % (ref 36.0–46.0)
Hemoglobin: 13.3 g/dL (ref 12.0–15.0)
Lymphocytes Relative: 38 %
Lymphs Abs: 2.8 10*3/uL (ref 0.7–4.0)
MCH: 27.3 pg (ref 26.0–34.0)
MCHC: 32 g/dL (ref 30.0–36.0)
MCV: 85.2 fL (ref 78.0–100.0)
Monocytes Absolute: 0.8 10*3/uL (ref 0.1–1.0)
Monocytes Relative: 12 %
Neutro Abs: 3.2 10*3/uL (ref 1.7–7.7)
Neutrophils Relative %: 44 %
Platelets: 312 10*3/uL (ref 150–400)
RBC: 4.87 MIL/uL (ref 3.87–5.11)
RDW: 13.4 % (ref 11.5–15.5)
WBC: 7.2 10*3/uL (ref 4.0–10.5)

## 2017-04-01 LAB — COMPREHENSIVE METABOLIC PANEL
ALT: 10 U/L — ABNORMAL LOW (ref 14–54)
AST: 18 U/L (ref 15–41)
Albumin: 3.9 g/dL (ref 3.5–5.0)
Alkaline Phosphatase: 67 U/L (ref 38–126)
Anion gap: 7 (ref 5–15)
BUN: 15 mg/dL (ref 6–20)
CO2: 24 mmol/L (ref 22–32)
Calcium: 9.8 mg/dL (ref 8.9–10.3)
Chloride: 105 mmol/L (ref 101–111)
Creatinine, Ser: 0.64 mg/dL (ref 0.44–1.00)
GFR calc Af Amer: >60 mL/min (ref >60)
GFR calc non Af Amer: >60 mL/min (ref >60)
Glucose, Bld: 96 mg/dL (ref 65–99)
Potassium: 3.5 mmol/L (ref 3.5–5.1)
Sodium: 136 mmol/L (ref 135–145)
Total Bilirubin: 1.2 mg/dL (ref 0.3–1.2)
Total Protein: 6.8 g/dL (ref 6.5–8.1)

## 2017-04-01 LAB — URINALYSIS, DIPSTICK ONLY
Bilirubin Urine: NEGATIVE
Glucose, UA: NEGATIVE mg/dL
Hgb urine dipstick: NEGATIVE
Ketones, ur: 15 mg/dL — AB
Nitrite: NEGATIVE
Protein, ur: NEGATIVE mg/dL
Specific Gravity, Urine: 1.015 (ref 1.005–1.030)
pH: 5.5 (ref 5.0–8.0)

## 2017-04-01 MED ORDER — SODIUM CHLORIDE 0.9 % IV BOLUS (SEPSIS)
500.0000 mL | Freq: Once | INTRAVENOUS | Status: AC
  Administered 2017-04-01: 500 mL via INTRAVENOUS

## 2017-04-01 MED ORDER — CLORAZEPATE DIPOTASSIUM 7.5 MG PO TABS: 7.5000 mg | ORAL_TABLET | Freq: Two times a day (BID) | ORAL | Status: DC | PRN

## 2017-04-01 MED ORDER — ATORVASTATIN CALCIUM 40 MG PO TABS
80.0000 mg | ORAL_TABLET | Freq: Every day | ORAL | Status: DC
  Administered 2017-04-02: 80 mg via ORAL
  Filled 2017-04-01: qty 2

## 2017-04-01 MED ORDER — ONDANSETRON HCL 4 MG/2ML IJ SOLN
4.0000 mg | Freq: Once | INTRAMUSCULAR | Status: AC
  Administered 2017-04-01: 4 mg via INTRAVENOUS
  Filled 2017-04-01: qty 2

## 2017-04-01 MED ORDER — MECLIZINE HCL 12.5 MG PO TABS
25.0000 mg | ORAL_TABLET | Freq: Three times a day (TID) | ORAL | Status: DC | PRN
  Administered 2017-04-02: 25 mg via ORAL
  Filled 2017-04-01: qty 2

## 2017-04-01 MED ORDER — SACUBITRIL-VALSARTAN 24-26 MG PO TABS
1.0000 | ORAL_TABLET | Freq: Two times a day (BID) | ORAL | Status: DC
  Administered 2017-04-02 – 2017-04-03 (×3): 1 via ORAL
  Filled 2017-04-01 (×7): qty 1

## 2017-04-01 MED ORDER — ASPIRIN EC 81 MG PO TBEC
81.0000 mg | DELAYED_RELEASE_TABLET | Freq: Every day | ORAL | Status: DC
  Administered 2017-04-02 – 2017-04-03 (×2): 81 mg via ORAL
  Filled 2017-04-01 (×2): qty 1

## 2017-04-01 MED ORDER — CARVEDILOL 3.125 MG PO TABS
6.2500 mg | ORAL_TABLET | Freq: Two times a day (BID) | ORAL | Status: DC
  Administered 2017-04-02 – 2017-04-03 (×3): 6.25 mg via ORAL
  Filled 2017-04-01 (×4): qty 2

## 2017-04-01 MED ORDER — MECLIZINE HCL 12.5 MG PO TABS
25.0000 mg | ORAL_TABLET | Freq: Once | ORAL | Status: AC
  Administered 2017-04-01: 25 mg via ORAL
  Filled 2017-04-01: qty 2

## 2017-04-01 MED ORDER — TRAMADOL HCL 50 MG PO TABS: 50.0000 mg | ORAL_TABLET | Freq: Every day | ORAL | Status: DC | PRN

## 2017-04-01 MED ORDER — NITROGLYCERIN 0.4 MG SL SUBL: 0.4000 mg | SUBLINGUAL_TABLET | SUBLINGUAL | Status: DC | PRN

## 2017-04-01 MED ORDER — PRASUGREL HCL 10 MG PO TABS
10.0000 mg | ORAL_TABLET | Freq: Every day | ORAL | Status: DC
  Administered 2017-04-02 – 2017-04-03 (×2): 10 mg via ORAL
  Filled 2017-04-01 (×4): qty 1

## 2017-04-01 MED ORDER — IOPAMIDOL (ISOVUE-370) INJECTION 76%
80.0000 mL | Freq: Once | INTRAVENOUS | Status: AC | PRN
  Administered 2017-04-01: 80 mL via INTRAVENOUS

## 2017-04-01 NOTE — ED Triage Notes (Signed)
Pt states she felt a pop in the back of her neck a few days ago.  States ever since she has had neck,head  pain and dizziness.

## 2017-04-01 NOTE — ED Provider Notes (Signed)
[facility] EMERGENCY DEPARTMENT Provider Note   CSN: [id number] Arrival date & time: 04/01/17  4081     History   Chief Complaint Chief Complaint  Patient presents with  . Dizziness    HPI [name] is a 81 y.o. female.  Patient complains of dizziness for the last 5 days.  She has been on Antivert for 3 days with no relief.  She states every time she moves her head she gets very dizzy nauseated   The history is provided by the patient.  Dizziness  Quality:  Head spinning Severity:  Moderate Timing:  Constant Progression:  Unchanged Chronicity:  Recurrent Context: head movement   Relieved by:  Nothing Associated symptoms: no chest pain, no diarrhea and no headaches     Past Medical History:  Diagnosis Date  . Anxiety   . Cervical disc disorder with myelopathy, unspecified cervical region   . Chronic systolic (congestive) heart failure ([place])   . COPD (chronic obstructive pulmonary disease) ()   . Essential hypertension   . Hemorrhoids   . Liver mass   . Lung, cysts, congenital    Left lung cyst  . Myocardial infarction ([place])   . Nephrolithiasis    Embedded  . Nonischemic cardiomyopathy ([place])    LVEF 35-40% 2015  . Osteoarthritis   . Thoracic ascending aortic aneurysm ([facility])    4.3 cm April 2016    Patient Active Problem List   Diagnosis Date Noted  . Dizziness 04/01/2017  . NSTEMI (non-ST elevated myocardial infarction) ([place]) 11/23/2016  . Hypertensive cardiovascular disease 10/03/2015  . Protein-calorie malnutrition, severe 07/13/2015  . Demand ischemia of myocardium ([place]) 07/13/2015  . Nonischemic cardiomyopathy ([place])   . Atypical chest pain   . Left bundle branch block   . COPD exacerbation ([place]) 07/11/2015  . Flu-like symptoms 07/11/2015  . Elevated troponin 07/11/2015  . Acute bronchitis 07/11/2015  . GERD (gastroesophageal reflux disease) 07/11/2015  . Bronchospasm   . Ascending aortic aneurysm ([place]) 01/27/2015  . Chronic combined  systolic and diastolic CHF (congestive heart failure) ([place]) 01/27/2015  . Chest pain at rest 01/27/2015  . Essential hypertension 01/27/2015  . Anxiety 01/27/2015  . Chronic pain 01/27/2015  . Chest pain 08/20/2013  . Hypertension 08/20/2013  . Contusion of left knee 08/07/2012  . Sprain of wrist 08/07/2012  . Liver mass 05/21/2011  . Aneurysm ([place]) 05/21/2011  . Abdominal pain 05/21/2011  . Bronchitis 05/21/2011  . De Quervain's disease (tenosynovitis) 04/02/2011  . SHOULDER PAIN 01/19/2009  . CERVICALGIA 01/19/2009  . IMPINGEMENT SYNDROME 01/19/2009    Past Surgical History:  Procedure Laterality Date  . Benign breast tumors    . CHOLECYSTECTOMY    . COLONOSCOPY    . COLONOSCOPY N/A 09/22/2014   Performed by Rogene Houston, MD at [place]  . Complete hysterectomy    . Coronary Stent Intervention N/A 11/23/2016   Performed by Martinique, Peter M, MD at [facility]  . Left Heart Cath and Coronary Angiography N/A 11/23/2016   Performed by Martinique, Peter M, MD at [facility]  . TONSILLECTOMY AND ADENOIDECTOMY      OB History    No data available       Home Medications    Prior to Admission medications   Medication Sig Start Date End Date Taking? Authorizing Provider  albuterol (PROVENTIL) (2.5 MG/3ML) 0.083% nebulizer solution Take 3 mLs (2.5 mg total) by nebulization every 6 (six) hours as needed for  wheezing or shortness of breath. 07/13/15  Yes Sinda Du, MD  aspirin EC 81 MG tablet Take 81 mg by mouth every morning.    Yes [provider]  atorvastatin (LIPITOR) 80 MG tablet Take 1 tablet (80 mg total) by mouth daily at 6 PM. 11/24/16  Yes Bhagat, Bhavinkumar, PA  carvedilol (COREG) 6.25 MG tablet TAKE ONE TABLET BY MOUTH 2 TIMES A DAY. 12/03/16  Yes Herminio Commons, MD  clorazepate (TRANXENE) 7.5 MG tablet Take 7.5 mg by mouth daily as needed for anxiety. For nerves   Yes [provider]  meclizine (ANTIVERT) 25 MG tablet Take  25 mg 3 (three) times daily as needed by mouth. 03/29/17  Yes [provider]  naproxen (NAPROSYN) 500 MG tablet Take 1 tablet by mouth 2 (two) times daily. 12/11/16  Yes [provider]  nitroGLYCERIN (NITROSTAT) 0.4 MG SL tablet Place 1 tablet (0.4 mg total) under the tongue every 5 (five) minutes x 3 doses as needed for chest pain. 11/24/16  Yes Bhagat, Bhavinkumar, PA  prasugrel (EFFIENT) 10 MG TABS tablet Take 1 tablet (10 mg total) daily by mouth. 03/25/17 06/23/17 Yes Herminio Commons, MD  PROAIR HFA 108 (90 BASE) MCG/ACT inhaler Inhale 2 puffs into the lungs every 6 (six) hours as needed for wheezing or shortness of breath.  02/08/14  Yes [provider]  sacubitril-valsartan (ENTRESTO) 24-26 MG Take 1 tablet by mouth 2 (two) times daily. 08/29/16  Yes Herminio Commons, MD  traMADol (ULTRAM) 50 MG tablet Take 50 mg by mouth daily as needed for moderate pain or severe pain. Maximum dose= 8 tablets per day   Yes [provider]    Family History Family History  Problem Relation Age of Onset  . Heart disease Mother   . Aneurysm Father   . Lung cancer Brother   . Heart disease Sister   . Diabetes Brother   . Heart disease Brother     Social History Social History   Tobacco Use  . Smoking status: Former Smoker    Packs/day: 0.25    Years: 31.00    Pack years: 7.75    Types: Cigarettes    Start date: 05/14/1977    Last attempt to quit: 05/28/2008    Years since quitting: 8.8  . Smokeless tobacco: Never Used  Substance Use Topics  . Alcohol use: No    Alcohol/week: 0.0 oz  . Drug use: No     Allergies   Plavix [clopidogrel bisulfate]; Alprazolam; Codeine; Percodan [oxycodone-aspirin]; and Valium   Review of Systems Review of Systems  Constitutional: Negative for appetite change and fatigue.  HENT: Negative for congestion, ear discharge and sinus pressure.   Eyes: Negative for discharge.  Respiratory: Negative for cough.     Cardiovascular: Negative for chest pain.  Gastrointestinal: Negative for abdominal pain and diarrhea.  Genitourinary: Negative for frequency and hematuria.  Musculoskeletal: Negative for back pain.  Skin: Negative for rash.  Neurological: Positive for dizziness. Negative for seizures and headaches.  Psychiatric/Behavioral: Negative for hallucinations.     Physical Exam Updated Vital Signs BP (!) 130/96   Pulse 89   Temp 98 F (36.7 C) (Oral)   Resp 20   Ht 5\' 2"  (1.575 m)   Wt 74.8 kg (165 lb)   SpO2 97%   BMI 30.18 kg/m   Physical Exam  Constitutional: She is oriented to person, place, and time. She appears well-developed.  HENT:  Head: Normocephalic.  Eyes: Conjunctivae  and EOM are normal. No scleral icterus.  Neck: Neck supple. No thyromegaly present.  Cardiovascular: Normal rate and regular rhythm. Exam reveals no gallop and no friction rub.  No murmur heard. Pulmonary/Chest: No stridor. She has no wheezes. She has no rales. She exhibits no tenderness.  Abdominal: She exhibits no distension. There is no tenderness. There is no rebound.  Musculoskeletal: Normal range of motion. She exhibits no edema.  Lymphadenopathy:    She has no cervical adenopathy.  Neurological: She is oriented to person, place, and time. She exhibits normal muscle tone. Coordination normal.  Skin: No rash noted. No erythema.  Psychiatric: She has a normal mood and affect. Her behavior is normal.     ED Treatments / Results  Labs (all labs ordered are listed, but only abnormal results are displayed) Labs Reviewed  COMPREHENSIVE METABOLIC PANEL - Abnormal; Notable for the following components:      Result Value   ALT 10 (*)    All other components within normal limits  URINALYSIS, DIPSTICK ONLY - Abnormal; Notable for the following components:   Ketones, ur 15 (*)    Leukocytes, UA SMALL (*)    All other components within normal limits  CBC WITH DIFFERENTIAL/PLATELET    EKG  EKG  Interpretation  Date/Time:  Monday April 01 2017 18:31:34 EST Ventricular Rate:  76 PR Interval:    QRS Duration: 162 QT Interval:  406 QTC Calculation: 457 R Axis:   69 Text Interpretation:  Sinus rhythm Multiple ventricular premature complexes Left bundle branch block Confirmed by Milton Ferguson [phone]) on 04/01/2017 10:33:52 PM       Radiology Ct Angio Head W Or Wo Contrast  Result Date: 04/01/2017 CLINICAL DATA:  81 y/o F; felt a pop in the back of head a few days ago. Headache, neck pain, and dizziness. EXAM: CT ANGIOGRAPHY HEAD AND NECK TECHNIQUE: Multidetector CT imaging of the head and neck was performed using the standard protocol during bolus administration of intravenous contrast. Multiplanar CT image reconstructions and MIPs were obtained to evaluate the vascular anatomy. Carotid stenosis measurements (when applicable) are obtained utilizing NASCET criteria, using the distal internal carotid diameter as the denominator. CONTRAST:  77mL ISOVUE-370 IOPAMIDOL (ISOVUE-370) INJECTION 76% COMPARISON:  06/24/2015 MRI head. 05/31/2015 CT head. 09/15/2009 thyroid ultrasound. FINDINGS: CT HEAD FINDINGS Brain: Stable right paramedian parietal extra-axial 10 mm dense nodule compatible with meningioma. No evidence for large acute infarct, intracranial hemorrhage, or focal mass effect. Stable mild chronic microvascular ischemic changes and parenchymal volume loss of the brain. No hydrocephalus or extra-axial collection. Stable 7 mm cerebellar tonsillar herniation. Vascular: As below. Skull: Normal. Negative for fracture or focal lesion. Sinuses: Imaged portions are clear. Orbits: No acute finding. Review of the MIP images confirms the above findings CTA NECK FINDINGS Aortic arch: Standard branching. Imaged portion shows no evidence of aneurysm or dissection. No significant stenosis of the major arch vessel origins. Mild calcific atherosclerosis. Right carotid system: No evidence of dissection,  stenosis (50% or greater) or occlusion. Mild calcific atherosclerosis of carotid bifurcation. Left carotid system: No evidence of dissection, stenosis (50% or greater) or occlusion. Mild calcific atherosclerosis of carotid bifurcation. Vertebral arteries: Codominant. No evidence of dissection, stenosis (50% or greater) or occlusion. Left V1 largely obscured by streak artifact from contrast bolus. Skeleton: C5-C7 anterior cervical discectomy infusion. Grade 1 anterolisthesis at C2-3 and C3-4. Mild disc and facet degenerative changes. No high-grade bony canal stenosis. Other neck: Stable 2.6 cm nodule within the right lobe of  the thyroid gland. Upper chest: Negative. Review of the MIP images confirms the above findings CTA HEAD FINDINGS Anterior circulation: No significant stenosis, proximal occlusion, aneurysm, or vascular malformation. Posterior circulation: No significant stenosis, proximal occlusion, aneurysm, or vascular malformation. Venous sinuses: As permitted by contrast timing, patent. Anatomic variants: Left fetal PCA. Small patent anterior and right posterior communicating arteries. Delayed phase: No abnormal enhancement of brain parenchyma. Review of the MIP images confirms the above findings IMPRESSION: 1. Patent carotid and vertebral arteries. No dissection, aneurysm, or hemodynamically significant stenosis utilizing NASCET criteria. 2. Patent circle of Willis. No large vessel occlusion, aneurysm, or significant stenosis. 3. Mild calcific atherosclerosis of aortic arch and carotid bifurcations. 4. Stable 2.6 cm nodule within right lobe of thyroid gland. 5. Stable 10 mm right parietal paramedian meningioma. 6. No acute intracranial abnormality on noncontrast CT of head. No abnormal enhancement of brain. 7. Mild chronic microvascular ischemic changes of the brain and parenchymal volume loss is stable. 8. Stable Chiari I malformation.  No hydrocephalus. Electronically Signed   By: Kristine Garbe  M.D.   On: 04/01/2017 21:16   Ct Angio Neck W And/or Wo Contrast  Result Date: 04/01/2017 CLINICAL DATA:  81 y/o F; felt a pop in the back of head a few days ago. Headache, neck pain, and dizziness. EXAM: CT ANGIOGRAPHY HEAD AND NECK TECHNIQUE: Multidetector CT imaging of the head and neck was performed using the standard protocol during bolus administration of intravenous contrast. Multiplanar CT image reconstructions and MIPs were obtained to evaluate the vascular anatomy. Carotid stenosis measurements (when applicable) are obtained utilizing NASCET criteria, using the distal internal carotid diameter as the denominator. CONTRAST:  15mL ISOVUE-370 IOPAMIDOL (ISOVUE-370) INJECTION 76% COMPARISON:  06/24/2015 MRI head. 05/31/2015 CT head. 09/15/2009 thyroid ultrasound. FINDINGS: CT HEAD FINDINGS Brain: Stable right paramedian parietal extra-axial 10 mm dense nodule compatible with meningioma. No evidence for large acute infarct, intracranial hemorrhage, or focal mass effect. Stable mild chronic microvascular ischemic changes and parenchymal volume loss of the brain. No hydrocephalus or extra-axial collection. Stable 7 mm cerebellar tonsillar herniation. Vascular: As below. Skull: Normal. Negative for fracture or focal lesion. Sinuses: Imaged portions are clear. Orbits: No acute finding. Review of the MIP images confirms the above findings CTA NECK FINDINGS Aortic arch: Standard branching. Imaged portion shows no evidence of aneurysm or dissection. No significant stenosis of the major arch vessel origins. Mild calcific atherosclerosis. Right carotid system: No evidence of dissection, stenosis (50% or greater) or occlusion. Mild calcific atherosclerosis of carotid bifurcation. Left carotid system: No evidence of dissection, stenosis (50% or greater) or occlusion. Mild calcific atherosclerosis of carotid bifurcation. Vertebral arteries: Codominant. No evidence of dissection, stenosis (50% or greater) or occlusion.  Left V1 largely obscured by streak artifact from contrast bolus. Skeleton: C5-C7 anterior cervical discectomy infusion. Grade 1 anterolisthesis at C2-3 and C3-4. Mild disc and facet degenerative changes. No high-grade bony canal stenosis. Other neck: Stable 2.6 cm nodule within the right lobe of the thyroid gland. Upper chest: Negative. Review of the MIP images confirms the above findings CTA HEAD FINDINGS Anterior circulation: No significant stenosis, proximal occlusion, aneurysm, or vascular malformation. Posterior circulation: No significant stenosis, proximal occlusion, aneurysm, or vascular malformation. Venous sinuses: As permitted by contrast timing, patent. Anatomic variants: Left fetal PCA. Small patent anterior and right posterior communicating arteries. Delayed phase: No abnormal enhancement of brain parenchyma. Review of the MIP images confirms the above findings IMPRESSION: 1. Patent carotid and vertebral arteries. No dissection, aneurysm, or  hemodynamically significant stenosis utilizing NASCET criteria. 2. Patent circle of Willis. No large vessel occlusion, aneurysm, or significant stenosis. 3. Mild calcific atherosclerosis of aortic arch and carotid bifurcations. 4. Stable 2.6 cm nodule within right lobe of thyroid gland. 5. Stable 10 mm right parietal paramedian meningioma. 6. No acute intracranial abnormality on noncontrast CT of head. No abnormal enhancement of brain. 7. Mild chronic microvascular ischemic changes of the brain and parenchymal volume loss is stable. 8. Stable Chiari I malformation.  No hydrocephalus. Electronically Signed   By: Kristine Garbe M.D.   On: 04/01/2017 21:16    Procedures Procedures (including critical care time)  Medications Ordered in ED Medications  meclizine (ANTIVERT) tablet 25 mg (25 mg Oral Given 04/01/17 1850)  ondansetron (ZOFRAN) injection 4 mg (4 mg Intravenous Given 04/01/17 1849)  sodium chloride 0.9 % bolus 500 mL (0 mLs Intravenous  Stopped 04/01/17 1946)  iopamidol (ISOVUE-370) 76 % injection 80 mL (80 mLs Intravenous Contrast Given 04/01/17 2045)     Initial Impression / Assessment and Plan / ED Course  I have reviewed the triage vital signs and the nursing notes.  Pertinent labs & imaging results that were available during my care of the patient were reviewed by me and considered in my medical decision making (see chart for details).     Patient with continued vertigo symptoms.  No help with Antivert and Zofran and fluids.  CT scan shows no acute stroke.  Patient unable to take care of herself at home without falling. she will be admitted to medicine for continued treatment  Final Clinical Impressions(s) / ED Diagnoses   Final diagnoses:  Dizziness    ED Discharge Orders    None       Milton Ferguson, MD 04/01/17 2302

## 2017-04-01 NOTE — H&P (Addendum)
History and Physical    [name] VVO:[id number] DOB: May 29, 1935 DOA: 04/01/2017  PCP: Sinda Du, MD   Patient coming from: Home.  I have personally briefly reviewed patient's old medical records in [place]  Chief Complaint: Dizziness.  HPI: [name] is a 81 y.o. female with medical history significant of anxiety, cervical spine disorder, chronic combined heart failure, essential hypertension, liver mass, hemorrhoids, congenital lung cyst, CAD status post NSTEMI in July this year with 2 vessel stenting, nephrolithiasis, osteoarthritis, thoracic ascending aortic aneurysm who is coming to the emergency department with complaints of progressively worse dizziness for the past several days after she felt a pop in the back of her neck several days ago which has resulted in occipital headache, nausea and dizziness. She has used Antivert for this in the past and states that it worked partially for her earlier after she was given a tablet of meclizine 25 mg p.o. in the emergency department.  She denies gait instability, falls, fever, chills, sore throat, productive cough, dyspnea, chest pain, palpitations, diaphoresis, recent pitting edema of the lower extremities, PND or orthopnea.  She denies vision changes, focal weakness or numbness.  No abdominal pain, emesis, diarrhea, melena or hematochezia.  Denies dysuria, frequency or hematuria.  ED Course: Initial vital signs in the emergency department temperature 98 F, pulse 77, respirations 18, blood pressure 142/77 mmHg and O2 sat 98% on room air. She also received Zofran 4 mg IVP x1 dose and 500 mL of normal saline bolus.  Urinalysis showed small hemoglobinuria and leukocyte esterase. Her CBC and CMP were unremarkable.  Imaging: CT angiogram of head and neck did not show any cervical or cerebral arterial circulation occlusion.  Please see images and full radiology report for further detail.  Review of Systems: As per HPI otherwise  10 point review of systems negative.   Past Medical History:  Diagnosis Date  . Anxiety   . Cervical disc disorder with myelopathy, unspecified cervical region   . Chronic systolic (congestive) heart failure ([place])   . COPD (chronic obstructive pulmonary disease) ([place])   . Essential hypertension   . Hemorrhoids   . Liver mass   . Lung, cysts, congenital    Left lung cyst  . Myocardial infarction ([place])   . Nephrolithiasis    Embedded  . Nonischemic cardiomyopathy ([place])    LVEF 35-40% 2015  . Osteoarthritis   . Thoracic ascending aortic aneurysm (H[facility])    4.3 cm April 2016    Past Surgical History:  Procedure Laterality Date  . Benign breast tumors    . CHOLECYSTECTOMY    . COLONOSCOPY    . COLONOSCOPY N/A 09/22/2014   Performed by Rogene Houston, MD at [facility]  . Complete hysterectomy    . Coronary Stent Intervention N/A 11/23/2016   Performed by Martinique, Peter M, MD at [facility]  . Left Heart Cath and Coronary Angiography N/A 11/23/2016   Performed by Martinique, Peter M, MD at [facility]  . TONSILLECTOMY AND ADENOIDECTOMY       reports that she quit smoking about 8 years ago. Her smoking use included cigarettes. She started smoking about 39 years ago. She has a 7.75 pack-year smoking history. she has never used smokeless tobacco. She reports that she does not drink alcohol or use drugs.  Allergies  Allergen Reactions  . Plavix [Clopidogrel Bisulfate] Itching    Severe itching  . Alprazolam Nausea And Vomiting  .  Codeine Nausea And Vomiting  . Percodan [Oxycodone-Aspirin] Nausea And Vomiting  . Valium Nausea And Vomiting    Family History  Problem Relation Age of Onset  . Heart disease Mother   . Aneurysm Father   . Lung cancer Brother   . Heart disease Sister   . Diabetes Brother   . Heart disease Brother     Prior to Admission medications   Medication Sig Start Date End Date Taking? Authorizing Provider  albuterol (PROVENTIL) (2.5 MG/3ML)  0.083% nebulizer solution Take 3 mLs (2.5 mg total) by nebulization every 6 (six) hours as needed for wheezing or shortness of breath. 07/13/15  Yes Sinda Du, MD  aspirin EC 81 MG tablet Take 81 mg by mouth every morning.    Yes [provider]  atorvastatin (LIPITOR) 80 MG tablet Take 1 tablet (80 mg total) by mouth daily at 6 PM. 11/24/16  Yes Bhagat, Bhavinkumar, PA  carvedilol (COREG) 6.25 MG tablet TAKE ONE TABLET BY MOUTH 2 TIMES A DAY. 12/03/16  Yes Herminio Commons, MD  clorazepate (TRANXENE) 7.5 MG tablet Take 7.5 mg by mouth daily as needed for anxiety. For nerves   Yes [provider]  meclizine (ANTIVERT) 25 MG tablet Take 25 mg 3 (three) times daily as needed by mouth. 03/29/17  Yes [provider]  naproxen (NAPROSYN) 500 MG tablet Take 1 tablet by mouth 2 (two) times daily. 12/11/16  Yes [provider]  nitroGLYCERIN (NITROSTAT) 0.4 MG SL tablet Place 1 tablet (0.4 mg total) under the tongue every 5 (five) minutes x 3 doses as needed for chest pain. 11/24/16  Yes Bhagat, Bhavinkumar, PA  prasugrel (EFFIENT) 10 MG TABS tablet Take 1 tablet (10 mg total) daily by mouth. 03/25/17 06/23/17 Yes Herminio Commons, MD  PROAIR HFA 108 (90 BASE) MCG/ACT inhaler Inhale 2 puffs into the lungs every 6 (six) hours as needed for wheezing or shortness of breath.  02/08/14  Yes [provider]  sacubitril-valsartan (ENTRESTO) 24-26 MG Take 1 tablet by mouth 2 (two) times daily. 08/29/16  Yes Herminio Commons, MD  traMADol (ULTRAM) 50 MG tablet Take 50 mg by mouth daily as needed for moderate pain or severe pain. Maximum dose= 8 tablets per day   Yes [provider]    Physical Exam: Vitals:   04/01/17 1006 04/01/17 1851 04/01/17 1930 04/01/17 2230  BP: (!) 142/77 (!) 145/74 126/66 (!) 130/96  Pulse: 77  74 89  Resp: 18  14 20   Temp: 98 F (36.7 C)     TempSrc: Oral     SpO2: 98%  95% 97%  Weight: 74.8 kg (165 lb)     Height: 5'  2" (1.575 m)       Constitutional: NAD, calm, comfortable Eyes: PERRL, lids and conjunctivae normal ENMT: Mucous membranes are moist. Posterior pharynx clear of any exudate or lesions. Neck: normal, supple, no masses, no thyromegaly Respiratory: clear to auscultation bilaterally, no wheezing, no crackles. Normal respiratory effort. No accessory muscle use.  Cardiovascular: Regular rate and rhythm, no murmurs / rubs / gallops. No extremity edema. 2+ pedal pulses. No carotid bruits.  Abdomen: Soft, no tenderness, no masses palpated. No hepatosplenomegaly. Bowel sounds positive.  Musculoskeletal: no clubbing / cyanosis. Good ROM, no contractures. Normal muscle tone.  Skin: no rashes, lesions, ulcers on limited dermatological exam. Neurologic: Moves all extremities.  No nystagmus.  The patient declined full evaluation at this time, since motion may trigger the symptoms. Psychiatric: Normal judgment  and insight. Alert and oriented x 4. Normal mood.    Labs on Admission: I have personally reviewed following labs and imaging studies  CBC: Recent Labs  Lab 04/01/17 1839  WBC 7.2  NEUTROABS 3.2  HGB 13.3  HCT 41.5  MCV 85.2  PLT 409   Basic Metabolic Panel: Recent Labs  Lab 04/01/17 1839  NA 136  K 3.5  CL 105  CO2 24  GLUCOSE 96  BUN 15  CREATININE 0.64  CALCIUM 9.8   GFR: Estimated Creatinine Clearance: 54 mL/min (by C-G formula based on SCr of 0.64 mg/dL). Liver Function Tests: Recent Labs  Lab 04/01/17 1839  AST 18  ALT 10*  ALKPHOS 67  BILITOT 1.2  PROT 6.8  ALBUMIN 3.9   No results for input(s): LIPASE, AMYLASE in the last 168 hours. No results for input(s): AMMONIA in the last 168 hours. Coagulation Profile: No results for input(s): INR, PROTIME in the last 168 hours. Cardiac Enzymes: No results for input(s): CKTOTAL, CKMB, CKMBINDEX, TROPONINI in the last 168 hours. BNP (last 3 results) No results for input(s): PROBNP in the last 8760 hours. HbA1C: No  results for input(s): HGBA1C in the last 72 hours. CBG: No results for input(s): GLUCAP in the last 168 hours. Lipid Profile: No results for input(s): CHOL, HDL, LDLCALC, TRIG, CHOLHDL, LDLDIRECT in the last 72 hours. Thyroid Function Tests: No results for input(s): TSH, T4TOTAL, FREET4, T3FREE, THYROIDAB in the last 72 hours. Anemia Panel: No results for input(s): VITAMINB12, FOLATE, FERRITIN, TIBC, IRON, RETICCTPCT in the last 72 hours. Urine analysis:    Component Value Date/Time   COLORURINE YELLOW 04/01/2017 1816   APPEARANCEUR CLEAR 04/01/2017 1816   LABSPEC 1.015 04/01/2017 1816   PHURINE 5.5 04/01/2017 1816   GLUCOSEU NEGATIVE 04/01/2017 1816   HGBUR NEGATIVE 04/01/2017 1816   BILIRUBINUR NEGATIVE 04/01/2017 1816   KETONESUR 15 (A) 04/01/2017 1816   PROTEINUR NEGATIVE 04/01/2017 1816   UROBILINOGEN 0.2 08/20/2013 1610   NITRITE NEGATIVE 04/01/2017 1816   LEUKOCYTESUR SMALL (A) 04/01/2017 1816    Radiological Exams on Admission: Ct Angio Head W Or Wo Contrast  Result Date: 04/01/2017 CLINICAL DATA:  81 y/o F; felt a pop in the back of head a few days ago. Headache, neck pain, and dizziness. EXAM: CT ANGIOGRAPHY HEAD AND NECK TECHNIQUE: Multidetector CT imaging of the head and neck was performed using the standard protocol during bolus administration of intravenous contrast. Multiplanar CT image reconstructions and MIPs were obtained to evaluate the vascular anatomy. Carotid stenosis measurements (when applicable) are obtained utilizing NASCET criteria, using the distal internal carotid diameter as the denominator. CONTRAST:  54mL ISOVUE-370 IOPAMIDOL (ISOVUE-370) INJECTION 76% COMPARISON:  06/24/2015 MRI head. 05/31/2015 CT head. 09/15/2009 thyroid ultrasound. FINDINGS: CT HEAD FINDINGS Brain: Stable right paramedian parietal extra-axial 10 mm dense nodule compatible with meningioma. No evidence for large acute infarct, intracranial hemorrhage, or focal mass effect. Stable mild  chronic microvascular ischemic changes and parenchymal volume loss of the brain. No hydrocephalus or extra-axial collection. Stable 7 mm cerebellar tonsillar herniation. Vascular: As below. Skull: Normal. Negative for fracture or focal lesion. Sinuses: Imaged portions are clear. Orbits: No acute finding. Review of the MIP images confirms the above findings CTA NECK FINDINGS Aortic arch: Standard branching. Imaged portion shows no evidence of aneurysm or dissection. No significant stenosis of the major arch vessel origins. Mild calcific atherosclerosis. Right carotid system: No evidence of dissection, stenosis (50% or greater) or occlusion. Mild calcific atherosclerosis of carotid bifurcation. Left  carotid system: No evidence of dissection, stenosis (50% or greater) or occlusion. Mild calcific atherosclerosis of carotid bifurcation. Vertebral arteries: Codominant. No evidence of dissection, stenosis (50% or greater) or occlusion. Left V1 largely obscured by streak artifact from contrast bolus. Skeleton: C5-C7 anterior cervical discectomy infusion. Grade 1 anterolisthesis at C2-3 and C3-4. Mild disc and facet degenerative changes. No high-grade bony canal stenosis. Other neck: Stable 2.6 cm nodule within the right lobe of the thyroid gland. Upper chest: Negative. Review of the MIP images confirms the above findings CTA HEAD FINDINGS Anterior circulation: No significant stenosis, proximal occlusion, aneurysm, or vascular malformation. Posterior circulation: No significant stenosis, proximal occlusion, aneurysm, or vascular malformation. Venous sinuses: As permitted by contrast timing, patent. Anatomic variants: Left fetal PCA. Small patent anterior and right posterior communicating arteries. Delayed phase: No abnormal enhancement of brain parenchyma. Review of the MIP images confirms the above findings IMPRESSION: 1. Patent carotid and vertebral arteries. No dissection, aneurysm, or hemodynamically significant stenosis  utilizing NASCET criteria. 2. Patent circle of Willis. No large vessel occlusion, aneurysm, or significant stenosis. 3. Mild calcific atherosclerosis of aortic arch and carotid bifurcations. 4. Stable 2.6 cm nodule within right lobe of thyroid gland. 5. Stable 10 mm right parietal paramedian meningioma. 6. No acute intracranial abnormality on noncontrast CT of head. No abnormal enhancement of brain. 7. Mild chronic microvascular ischemic changes of the brain and parenchymal volume loss is stable. 8. Stable Chiari I malformation.  No hydrocephalus. Electronically Signed   By: Kristine Garbe M.D.   On: 04/01/2017 21:16   Ct Angio Neck W And/or Wo Contrast  Result Date: 04/01/2017 CLINICAL DATA:  81 y/o F; felt a pop in the back of head a few days ago. Headache, neck pain, and dizziness. EXAM: CT ANGIOGRAPHY HEAD AND NECK TECHNIQUE: Multidetector CT imaging of the head and neck was performed using the standard protocol during bolus administration of intravenous contrast. Multiplanar CT image reconstructions and MIPs were obtained to evaluate the vascular anatomy. Carotid stenosis measurements (when applicable) are obtained utilizing NASCET criteria, using the distal internal carotid diameter as the denominator. CONTRAST:  30mL ISOVUE-370 IOPAMIDOL (ISOVUE-370) INJECTION 76% COMPARISON:  06/24/2015 MRI head. 05/31/2015 CT head. 09/15/2009 thyroid ultrasound. FINDINGS: CT HEAD FINDINGS Brain: Stable right paramedian parietal extra-axial 10 mm dense nodule compatible with meningioma. No evidence for large acute infarct, intracranial hemorrhage, or focal mass effect. Stable mild chronic microvascular ischemic changes and parenchymal volume loss of the brain. No hydrocephalus or extra-axial collection. Stable 7 mm cerebellar tonsillar herniation. Vascular: As below. Skull: Normal. Negative for fracture or focal lesion. Sinuses: Imaged portions are clear. Orbits: No acute finding. Review of the MIP images  confirms the above findings CTA NECK FINDINGS Aortic arch: Standard branching. Imaged portion shows no evidence of aneurysm or dissection. No significant stenosis of the major arch vessel origins. Mild calcific atherosclerosis. Right carotid system: No evidence of dissection, stenosis (50% or greater) or occlusion. Mild calcific atherosclerosis of carotid bifurcation. Left carotid system: No evidence of dissection, stenosis (50% or greater) or occlusion. Mild calcific atherosclerosis of carotid bifurcation. Vertebral arteries: Codominant. No evidence of dissection, stenosis (50% or greater) or occlusion. Left V1 largely obscured by streak artifact from contrast bolus. Skeleton: C5-C7 anterior cervical discectomy infusion. Grade 1 anterolisthesis at C2-3 and C3-4. Mild disc and facet degenerative changes. No high-grade bony canal stenosis. Other neck: Stable 2.6 cm nodule within the right lobe of the thyroid gland. Upper chest: Negative. Review of the MIP images confirms the  above findings CTA HEAD FINDINGS Anterior circulation: No significant stenosis, proximal occlusion, aneurysm, or vascular malformation. Posterior circulation: No significant stenosis, proximal occlusion, aneurysm, or vascular malformation. Venous sinuses: As permitted by contrast timing, patent. Anatomic variants: Left fetal PCA. Small patent anterior and right posterior communicating arteries. Delayed phase: No abnormal enhancement of brain parenchyma. Review of the MIP images confirms the above findings IMPRESSION: 1. Patent carotid and vertebral arteries. No dissection, aneurysm, or hemodynamically significant stenosis utilizing NASCET criteria. 2. Patent circle of Willis. No large vessel occlusion, aneurysm, or significant stenosis. 3. Mild calcific atherosclerosis of aortic arch and carotid bifurcations. 4. Stable 2.6 cm nodule within right lobe of thyroid gland. 5. Stable 10 mm right parietal paramedian meningioma. 6. No acute intracranial  abnormality on noncontrast CT of head. No abnormal enhancement of brain. 7. Mild chronic microvascular ischemic changes of the brain and parenchymal volume loss is stable. 8. Stable Chiari I malformation.  No hydrocephalus. Electronically Signed   By: Kristine Garbe M.D.   On: 04/01/2017 21:16    Coronary Stent Intervention 11/23/2016   Left Heart Cath and Coronary Angiography   Conclusion     LV end diastolic pressure is normal.  Mid Cx lesion, 30 %stenosed.  Prox RCA lesion, 20 %stenosed.  Prox LAD to Mid LAD lesion, 85 %stenosed.  A STENT PROMUS PREM MR 3.5X32 drug eluting stent was successfully placed.  Post intervention, there is a 0% residual stenosis.  1st Mrg lesion, 90 %stenosed.  A STENT PROMUS PREM MR 2.5X16 drug eluting stent was successfully placed.  Post intervention, there is a 0% residual stenosis.   1. 2 vessel obstructive CAD    - 85% segmental mid LAD    - 90% mid OM1.  2. Normal LVEDP 3. Successful stenting of the mid LAD with DES 4. Successful stenting of the mid OM1 with DES  Plan: DAPT for one year. Anticipate DC in am.     Echo 11/24/16 Study Conclusions  - Left ventricle: The cavity size was normal. There was mild concentric hypertrophy. Systolic function was mildly to moderately reduced. The estimated ejection fraction was in the range of 40% to 45%. Diffuse hypokinesis worse in the antero and inferoseptum. Doppler parameters are consistent with abnormal left ventricular relaxation (grade 1 diastolic dysfunction). Doppler parameters are consistent with indeterminate ventricular filling pressure. - Aortic valve: Transvalvular velocity was within the normal range. There was no stenosis. There was moderate regurgitation. Valve area (VTI): 1.6 cm^2. Valve area (Vmax): 1.6 cm^2. Valve area (Vmean): 1.59 cm^2. - Mitral valve: Transvalvular velocity was within the normal range. There was no evidence for  stenosis. There was trivial regurgitation. - Right ventricle: The cavity size was normal. Wall thickness was normal. Systolic function was normal. - Atrial septum: No defect or patent foramen ovale was identified by color flow Doppler. - Tricuspid valve: There was no regurgitation.    EKG: Independently reviewed. Vent. rate 76 BPM PR interval * ms QRS duration 162 ms QT/QTc 406/457 ms P-R-T axes 68 69 260 Sinus rhythm Multiple ventricular premature complexes Left bundle branch block  Assessment/Plan Principal Problem:   Dizziness Symptoms are very similar to the patient's known vertigo. Observation/telemetry. Overnight/time limited IV hydration. Continue Zofran as needed for nausea. Continue Antivert as needed for dizziness/vertigo. Consider brain MRI and/or neurology evaluation if no improvement.  Active Problems:   CAD (coronary artery disease) Continue aspirin 81 mg p.o. daily. Continue Prasugrel 10 mg p.o. daily. Continue carvedilol 6.25 mg p.o. twice daily. Continue  atorvastatin 80 mg p.o. daily. Sublingual nitroglycerin as needed.    Ascending aortic aneurysm ([facility]) Unchanged when compared to earlier imaging. Continue surveillance imaging as needed or per guidelines.    Chronic combined systolic and diastolic CHF (congestive heart failure) ([facility]) Signs of decompensation at this time. Continue Coreg and Entresto 24-26 mg p.o. twice daily.    Essential hypertension Continue beta-blocker. Continue Entresto 24-26 mg p.o. twice daily. Monitor blood pressure, renal function and electrolytes.    GERD (gastroesophageal reflux disease) Protonix 40 mg p.o. daily.    COPD (chronic obstructive pulmonary disease) ([place]) Supplemental oxygen as needed. Bronchodilators as needed.    DVT prophylaxis: Lovenox SQ. Code Status: Full code. Family Communication:  Disposition Plan: Observation for IV hydration, symptoms monitoring and treatment. Consults called:    Admission status: Observation/Telemetry.   Reubin Milan MD [facility] Pager [phone]  If 7PM-7AM, please contact night-coverage [email or web address] Password [email or web address]  04/01/2017, 11:40 PM

## 2017-04-02 ENCOUNTER — Encounter (HOSPITAL_COMMUNITY): Payer: Self-pay

## 2017-04-02 ENCOUNTER — Other Ambulatory Visit: Payer: Self-pay

## 2017-04-02 DIAGNOSIS — I5022 Chronic systolic (congestive) heart failure: Secondary | ICD-10-CM | POA: Diagnosis not present

## 2017-04-02 DIAGNOSIS — R42 Dizziness and giddiness: Secondary | ICD-10-CM | POA: Diagnosis not present

## 2017-04-02 LAB — MAGNESIUM: Magnesium: 2 mg/dL (ref 1.7–2.4)

## 2017-04-02 LAB — CBC
HCT: 38.9 % (ref 36.0–46.0)
Hemoglobin: 12.3 g/dL (ref 12.0–15.0)
MCH: 27.1 pg (ref 26.0–34.0)
MCHC: 31.6 g/dL (ref 30.0–36.0)
MCV: 85.7 fL (ref 78.0–100.0)
Platelets: 273 10*3/uL (ref 150–400)
RBC: 4.54 MIL/uL (ref 3.87–5.11)
RDW: 13.5 % (ref 11.5–15.5)
WBC: 6.1 10*3/uL (ref 4.0–10.5)

## 2017-04-02 LAB — COMPREHENSIVE METABOLIC PANEL
ALT: 8 U/L — ABNORMAL LOW (ref 14–54)
AST: 16 U/L (ref 15–41)
Albumin: 3.2 g/dL — ABNORMAL LOW (ref 3.5–5.0)
Alkaline Phosphatase: 55 U/L (ref 38–126)
Anion gap: 7 (ref 5–15)
BUN: 17 mg/dL (ref 6–20)
CO2: 25 mmol/L (ref 22–32)
Calcium: 9.1 mg/dL (ref 8.9–10.3)
Chloride: 106 mmol/L (ref 101–111)
Creatinine, Ser: 0.66 mg/dL (ref 0.44–1.00)
GFR calc Af Amer: >60 mL/min (ref >60)
GFR calc non Af Amer: >60 mL/min (ref >60)
Glucose, Bld: 85 mg/dL (ref 65–99)
Potassium: 3.8 mmol/L (ref 3.5–5.1)
Sodium: 138 mmol/L (ref 135–145)
Total Bilirubin: 0.8 mg/dL (ref 0.3–1.2)
Total Protein: 5.6 g/dL — ABNORMAL LOW (ref 6.5–8.1)

## 2017-04-02 MED ORDER — PREDNISONE 20 MG PO TABS
40.0000 mg | ORAL_TABLET | Freq: Every day | ORAL | Status: DC
  Administered 2017-04-02 – 2017-04-03 (×2): 40 mg via ORAL
  Filled 2017-04-02 (×2): qty 2

## 2017-04-02 MED ORDER — PANTOPRAZOLE SODIUM 40 MG PO TBEC
40.0000 mg | DELAYED_RELEASE_TABLET | Freq: Every day | ORAL | Status: DC
  Administered 2017-04-02 – 2017-04-03 (×2): 40 mg via ORAL
  Filled 2017-04-02 (×2): qty 1

## 2017-04-02 MED ORDER — ONDANSETRON HCL 4 MG/2ML IJ SOLN
4.0000 mg | Freq: Four times a day (QID) | INTRAMUSCULAR | Status: DC | PRN
  Administered 2017-04-02: 4 mg via INTRAVENOUS
  Filled 2017-04-02: qty 2

## 2017-04-02 MED ORDER — ENOXAPARIN SODIUM 40 MG/0.4ML ~~LOC~~ SOLN
40.0000 mg | SUBCUTANEOUS | Status: DC
  Administered 2017-04-02 – 2017-04-03 (×2): 40 mg via SUBCUTANEOUS
  Filled 2017-04-02 (×2): qty 0.4

## 2017-04-02 MED ORDER — INFLUENZA VAC SPLIT HIGH-DOSE 0.5 ML IM SUSY
0.5000 mL | PREFILLED_SYRINGE | INTRAMUSCULAR | Status: AC
  Administered 2017-04-03: 0.5 mL via INTRAMUSCULAR
  Filled 2017-04-02: qty 0.5

## 2017-04-02 MED ORDER — ONDANSETRON HCL 4 MG PO TABS: 4.0000 mg | ORAL_TABLET | Freq: Four times a day (QID) | ORAL | Status: DC | PRN

## 2017-04-02 MED ORDER — POTASSIUM CHLORIDE IN NACL 20-0.9 MEQ/L-% IV SOLN
INTRAVENOUS | Status: DC
  Administered 2017-04-02: 01:00:00 via INTRAVENOUS

## 2017-04-02 MED ORDER — MECLIZINE HCL 12.5 MG PO TABS
25.0000 mg | ORAL_TABLET | Freq: Four times a day (QID) | ORAL | Status: DC
  Administered 2017-04-02 – 2017-04-03 (×5): 25 mg via ORAL
  Filled 2017-04-02 (×5): qty 2

## 2017-04-02 NOTE — Care Management Note (Signed)
Case Management Note  Patient Details  Name: SADIRA STANDARD MRN: [id number] Date of Birth: February 15, 1936  Subjective/Objective:  Adm with dizziness. From home alone, no HH or DME pta. She does have PCP and insurance. Reports she still drives. ? If dizziness is related to positional vertigo. CM did discuss option of Home health if patient is found to have vertigo. She is agreeable.             Action/Plan:CM following for needs.    Expected Discharge Date:  04/03/17               Expected Discharge Plan:     In-House Referral:     Discharge planning Services  CM Consult  Post Acute Care Choice:    Choice offered to:     DME Arranged:    DME Agency:     HH Arranged:    HH Agency:     Status of Service:  In process, will continue to follow  If discussed at Long Length of Stay Meetings, dates discussed:    Additional Comments:  Sharday Michl, Chauncey Reading, RN 04/02/2017, 2:22 PM

## 2017-04-02 NOTE — Care Management Obs Status (Signed)
[facility] NOTIFICATION   Patient Details  Name: [name] MRN: [id number] Date of Birth: Jun 20, 1935   Medicare Observation Status Notification Given:  Yes    Juvon Teater, Chauncey Reading, RN 04/02/2017, 9:43 AM

## 2017-04-02 NOTE — Progress Notes (Signed)
Subjective: She was brought in for observation with dizziness.  This is a acute on chronic problem.  Her CT does not show an acute event.  Part of the problem I think is that she has some generalized atherosclerosis and she has combined systolic and diastolic heart failure and she is on medications for that.  I suspect that she has chronic ischemia causing some of the dizziness.  However it is somewhat positional so there may be some element of benign positional vertigo.  Objective: Vital signs in last 24 hours: Temp:  [97.7 F (36.5 C)-98 F (36.7 C)] 97.7 F (36.5 C) (11/20 0548) Pulse Rate:  [74-89] 79 (11/20 0548) Resp:  [14-20] 18 (11/20 0548) BP: (126-157)/(55-96) 136/55 (11/20 0548) SpO2:  [93 %-98 %] 93 % (11/20 0548) Weight:  [73.9 kg (162 lb 14.7 oz)-74.8 kg (165 lb)] 73.9 kg (162 lb 14.7 oz) (11/19 2355) Weight change:  Last BM Date: 04/01/17  Intake/Output from previous day: 11/19 0701 - 11/20 0700 In: 1206.4 [P.O.:240; I.V.:466.4; IV Piggyback:500] Out: -   PHYSICAL EXAM General appearance: alert, cooperative and mild distress Resp: clear to auscultation bilaterally Cardio: regular rate and rhythm, S1, S2 normal, no murmur, click, rub or gallop GI: soft, non-tender; bowel sounds normal; no masses,  no organomegaly Extremities: extremities normal, atraumatic, no cyanosis or edema She is lying on the right side.  She does not show nystagmus  Lab Results:  Results for orders placed or performed during the hospital encounter of 04/01/17 (from the past 48 hour(s))  Urinalysis, dipstick only     Status: Abnormal   Collection Time: 04/01/17  6:16 PM  Result Value Ref Range   Color, Urine YELLOW YELLOW    Comment: STAT   APPearance CLEAR CLEAR    Comment: STAT   Specific Gravity, Urine 1.015 1.005 - 1.030    Comment: STAT   pH 5.5 5.0 - 8.0    Comment: STAT   Glucose, UA NEGATIVE NEGATIVE mg/dL    Comment: STAT   Hgb urine dipstick NEGATIVE NEGATIVE    Comment:  STAT   Bilirubin Urine NEGATIVE NEGATIVE    Comment: STAT   Ketones, ur 15 (A) NEGATIVE mg/dL    Comment: STAT   Protein, ur NEGATIVE NEGATIVE mg/dL    Comment: STAT   Nitrite NEGATIVE NEGATIVE    Comment: STAT   Leukocytes, UA SMALL (A) NEGATIVE    Comment: STAT  CBC with Differential/Platelet     Status: None   Collection Time: 04/01/17  6:39 PM  Result Value Ref Range   WBC 7.2 4.0 - 10.5 K/uL   RBC 4.87 3.87 - 5.11 MIL/uL   Hemoglobin 13.3 12.0 - 15.0 g/dL   HCT 41.5 36.0 - 46.0 %   MCV 85.2 78.0 - 100.0 fL   MCH 27.3 26.0 - 34.0 pg   MCHC 32.0 30.0 - 36.0 g/dL   RDW 13.4 11.5 - 15.5 %   Platelets 312 150 - 400 K/uL   Neutrophils Relative % 44 %   Neutro Abs 3.2 1.7 - 7.7 K/uL   Lymphocytes Relative 38 %   Lymphs Abs 2.8 0.7 - 4.0 K/uL   Monocytes Relative 12 %   Monocytes Absolute 0.8 0.1 - 1.0 K/uL   Eosinophils Relative 6 %   Eosinophils Absolute 0.5 0.0 - 0.7 K/uL   Basophils Relative 0 %   Basophils Absolute 0.0 0.0 - 0.1 K/uL  Comprehensive metabolic panel     Status: Abnormal   Collection Time:  04/01/17  6:39 PM  Result Value Ref Range   Sodium 136 135 - 145 mmol/L   Potassium 3.5 3.5 - 5.1 mmol/L   Chloride 105 101 - 111 mmol/L   CO2 24 22 - 32 mmol/L   Glucose, Bld 96 65 - 99 mg/dL   BUN 15 6 - 20 mg/dL   Creatinine, Ser 0.64 0.44 - 1.00 mg/dL   Calcium 9.8 8.9 - 10.3 mg/dL   Total Protein 6.8 6.5 - 8.1 g/dL   Albumin 3.9 3.5 - 5.0 g/dL   AST 18 15 - 41 U/L   ALT 10 (L) 14 - 54 U/L   Alkaline Phosphatase 67 38 - 126 U/L   Total Bilirubin 1.2 0.3 - 1.2 mg/dL   GFR calc non Af Amer >60 >60 mL/min   GFR calc Af Amer >60 >60 mL/min    Comment: (NOTE) The eGFR has been calculated using the CKD EPI equation. This calculation has not been validated in all clinical situations. eGFR's persistently <60 mL/min signify possible Chronic Kidney Disease.    Anion gap 7 5 - 15  CBC     Status: None   Collection Time: 04/02/17  5:49 AM  Result Value Ref Range    WBC 6.1 4.0 - 10.5 K/uL   RBC 4.54 3.87 - 5.11 MIL/uL   Hemoglobin 12.3 12.0 - 15.0 g/dL   HCT 38.9 36.0 - 46.0 %   MCV 85.7 78.0 - 100.0 fL   MCH 27.1 26.0 - 34.0 pg   MCHC 31.6 30.0 - 36.0 g/dL   RDW 13.5 11.5 - 15.5 %   Platelets 273 150 - 400 K/uL    ABGS No results for input(s): PHART, PO2ART, TCO2, HCO3 in the last 72 hours.  Invalid input(s): PCO2 CULTURES No results found for this or any previous visit (from the past 240 hour(s)). Studies/Results: Ct Angio Head W Or Wo Contrast  Result Date: 04/01/2017 CLINICAL DATA:  81 y/o F; felt a pop in the back of head a few days ago. Headache, neck pain, and dizziness. EXAM: CT ANGIOGRAPHY HEAD AND NECK TECHNIQUE: Multidetector CT imaging of the head and neck was performed using the standard protocol during bolus administration of intravenous contrast. Multiplanar CT image reconstructions and MIPs were obtained to evaluate the vascular anatomy. Carotid stenosis measurements (when applicable) are obtained utilizing NASCET criteria, using the distal internal carotid diameter as the denominator. CONTRAST:  7m ISOVUE-370 IOPAMIDOL (ISOVUE-370) INJECTION 76% COMPARISON:  06/24/2015 MRI head. 05/31/2015 CT head. 09/15/2009 thyroid ultrasound. FINDINGS: CT HEAD FINDINGS Brain: Stable right paramedian parietal extra-axial 10 mm dense nodule compatible with meningioma. No evidence for large acute infarct, intracranial hemorrhage, or focal mass effect. Stable mild chronic microvascular ischemic changes and parenchymal volume loss of the brain. No hydrocephalus or extra-axial collection. Stable 7 mm cerebellar tonsillar herniation. Vascular: As below. Skull: Normal. Negative for fracture or focal lesion. Sinuses: Imaged portions are clear. Orbits: No acute finding. Review of the MIP images confirms the above findings CTA NECK FINDINGS Aortic arch: Standard branching. Imaged portion shows no evidence of aneurysm or dissection. No significant stenosis of  the major arch vessel origins. Mild calcific atherosclerosis. Right carotid system: No evidence of dissection, stenosis (50% or greater) or occlusion. Mild calcific atherosclerosis of carotid bifurcation. Left carotid system: No evidence of dissection, stenosis (50% or greater) or occlusion. Mild calcific atherosclerosis of carotid bifurcation. Vertebral arteries: Codominant. No evidence of dissection, stenosis (50% or greater) or occlusion. Left V1 largely obscured by streak  artifact from contrast bolus. Skeleton: C5-C7 anterior cervical discectomy infusion. Grade 1 anterolisthesis at C2-3 and C3-4. Mild disc and facet degenerative changes. No high-grade bony canal stenosis. Other neck: Stable 2.6 cm nodule within the right lobe of the thyroid gland. Upper chest: Negative. Review of the MIP images confirms the above findings CTA HEAD FINDINGS Anterior circulation: No significant stenosis, proximal occlusion, aneurysm, or vascular malformation. Posterior circulation: No significant stenosis, proximal occlusion, aneurysm, or vascular malformation. Venous sinuses: As permitted by contrast timing, patent. Anatomic variants: Left fetal PCA. Small patent anterior and right posterior communicating arteries. Delayed phase: No abnormal enhancement of brain parenchyma. Review of the MIP images confirms the above findings IMPRESSION: 1. Patent carotid and vertebral arteries. No dissection, aneurysm, or hemodynamically significant stenosis utilizing NASCET criteria. 2. Patent circle of Willis. No large vessel occlusion, aneurysm, or significant stenosis. 3. Mild calcific atherosclerosis of aortic arch and carotid bifurcations. 4. Stable 2.6 cm nodule within right lobe of thyroid gland. 5. Stable 10 mm right parietal paramedian meningioma. 6. No acute intracranial abnormality on noncontrast CT of head. No abnormal enhancement of brain. 7. Mild chronic microvascular ischemic changes of the brain and parenchymal volume loss is  stable. 8. Stable Chiari I malformation.  No hydrocephalus. Electronically Signed   By: Kristine Garbe M.D.   On: 04/01/2017 21:16   Ct Angio Neck W And/or Wo Contrast  Result Date: 04/01/2017 CLINICAL DATA:  81 y/o F; felt a pop in the back of head a few days ago. Headache, neck pain, and dizziness. EXAM: CT ANGIOGRAPHY HEAD AND NECK TECHNIQUE: Multidetector CT imaging of the head and neck was performed using the standard protocol during bolus administration of intravenous contrast. Multiplanar CT image reconstructions and MIPs were obtained to evaluate the vascular anatomy. Carotid stenosis measurements (when applicable) are obtained utilizing NASCET criteria, using the distal internal carotid diameter as the denominator. CONTRAST:  1m ISOVUE-370 IOPAMIDOL (ISOVUE-370) INJECTION 76% COMPARISON:  06/24/2015 MRI head. 05/31/2015 CT head. 09/15/2009 thyroid ultrasound. FINDINGS: CT HEAD FINDINGS Brain: Stable right paramedian parietal extra-axial 10 mm dense nodule compatible with meningioma. No evidence for large acute infarct, intracranial hemorrhage, or focal mass effect. Stable mild chronic microvascular ischemic changes and parenchymal volume loss of the brain. No hydrocephalus or extra-axial collection. Stable 7 mm cerebellar tonsillar herniation. Vascular: As below. Skull: Normal. Negative for fracture or focal lesion. Sinuses: Imaged portions are clear. Orbits: No acute finding. Review of the MIP images confirms the above findings CTA NECK FINDINGS Aortic arch: Standard branching. Imaged portion shows no evidence of aneurysm or dissection. No significant stenosis of the major arch vessel origins. Mild calcific atherosclerosis. Right carotid system: No evidence of dissection, stenosis (50% or greater) or occlusion. Mild calcific atherosclerosis of carotid bifurcation. Left carotid system: No evidence of dissection, stenosis (50% or greater) or occlusion. Mild calcific atherosclerosis of  carotid bifurcation. Vertebral arteries: Codominant. No evidence of dissection, stenosis (50% or greater) or occlusion. Left V1 largely obscured by streak artifact from contrast bolus. Skeleton: C5-C7 anterior cervical discectomy infusion. Grade 1 anterolisthesis at C2-3 and C3-4. Mild disc and facet degenerative changes. No high-grade bony canal stenosis. Other neck: Stable 2.6 cm nodule within the right lobe of the thyroid gland. Upper chest: Negative. Review of the MIP images confirms the above findings CTA HEAD FINDINGS Anterior circulation: No significant stenosis, proximal occlusion, aneurysm, or vascular malformation. Posterior circulation: No significant stenosis, proximal occlusion, aneurysm, or vascular malformation. Venous sinuses: As permitted by contrast timing, patent. Anatomic variants:  Left fetal PCA. Small patent anterior and right posterior communicating arteries. Delayed phase: No abnormal enhancement of brain parenchyma. Review of the MIP images confirms the above findings IMPRESSION: 1. Patent carotid and vertebral arteries. No dissection, aneurysm, or hemodynamically significant stenosis utilizing NASCET criteria. 2. Patent circle of Willis. No large vessel occlusion, aneurysm, or significant stenosis. 3. Mild calcific atherosclerosis of aortic arch and carotid bifurcations. 4. Stable 2.6 cm nodule within right lobe of thyroid gland. 5. Stable 10 mm right parietal paramedian meningioma. 6. No acute intracranial abnormality on noncontrast CT of head. No abnormal enhancement of brain. 7. Mild chronic microvascular ischemic changes of the brain and parenchymal volume loss is stable. 8. Stable Chiari I malformation.  No hydrocephalus. Electronically Signed   By: Kristine Garbe M.D.   On: 04/01/2017 21:16    Medications:  Prior to Admission:  Medications Prior to Admission  Medication Sig Dispense Refill Last Dose  . albuterol (PROVENTIL) (2.5 MG/3ML) 0.083% nebulizer solution Take  3 mLs (2.5 mg total) by nebulization every 6 (six) hours as needed for wheezing or shortness of breath. 75 mL 12 unknown  . aspirin EC 81 MG tablet Take 81 mg by mouth every morning.    04/01/2017 at Unknown time  . atorvastatin (LIPITOR) 80 MG tablet Take 1 tablet (80 mg total) by mouth daily at 6 PM. 30 tablet 6 03/31/2017 at Unknown time  . carvedilol (COREG) 6.25 MG tablet TAKE ONE TABLET BY MOUTH 2 TIMES A DAY. 180 tablet 0 04/01/2017 at [place]  . clorazepate (TRANXENE) 7.5 MG tablet Take 7.5 mg by mouth daily as needed for anxiety. For nerves   03/31/2017 at Unknown time  . meclizine (ANTIVERT) 25 MG tablet Take 25 mg 3 (three) times daily as needed by mouth.   04/01/2017 at Unknown time  . naproxen (NAPROSYN) 500 MG tablet Take 1 tablet by mouth 2 (two) times daily.   04/01/2017 at Unknown time  . nitroGLYCERIN (NITROSTAT) 0.4 MG SL tablet Place 1 tablet (0.4 mg total) under the tongue every 5 (five) minutes x 3 doses as needed for chest pain. 25 tablet 12 unknown  . prasugrel (EFFIENT) 10 MG TABS tablet Take 1 tablet (10 mg total) daily by mouth. 90 tablet 3 04/01/2017 at Unknown time  . PROAIR HFA 108 (90 BASE) MCG/ACT inhaler Inhale 2 puffs into the lungs every 6 (six) hours as needed for wheezing or shortness of breath.    04/01/2017 at Unknown time  . sacubitril-valsartan (ENTRESTO) 24-26 MG Take 1 tablet by mouth 2 (two) times daily. 60 tablet 6 04/01/2017 at Unknown time  . traMADol (ULTRAM) 50 MG tablet Take 50 mg by mouth daily as needed for moderate pain or severe pain. Maximum dose= 8 tablets per day   03/31/2017 at Unknown time   Scheduled: . aspirin EC  81 mg Oral Daily  . atorvastatin  80 mg Oral q1800  . carvedilol  6.25 mg Oral BID WC  . enoxaparin (LOVENOX) injection  40 mg Subcutaneous Q24H  . [START ON 04/03/2017] Influenza vac split quadrivalent PF  0.5 mL Intramuscular Tomorrow-1000  . meclizine  25 mg Oral Q6H  . pantoprazole  40 mg Oral Daily  . prasugrel  10 mg Oral  Daily  . predniSONE  40 mg Oral Q breakfast  . sacubitril-valsartan  1 tablet Oral BID   Continuous: . 0.9 % NaCl with KCl 20 mEq / L 88 mL/hr at 04/02/17 0036   OZH:YQMVHQIONGE, nitroGLYCERIN, ondansetron **OR** ondansetron (ZOFRAN)  IV, traMADol  Assesment: She has dizziness.  I think this is probably combination of multiple problems.  She still complains of dizziness on going to put her on scheduled meclizine.  Add prednisone.  See how she does after that.  She has heart failure which I think is about the same.  She has coronary disease but no chest pain.  She has an ascending aortic by CT. Principal Problem:   Dizziness Active Problems:   Ascending aortic aneurysm (HCC)   Chronic combined systolic and diastolic CHF (congestive heart failure) (HCC)   Essential hypertension   GERD (gastroesophageal reflux disease)   COPD (chronic obstructive pulmonary disease) (HCC)   CAD (coronary artery disease)    Plan: As above    LOS: 0 days   Harry Bark L 04/02/2017, 8:46 AM

## 2017-04-03 DIAGNOSIS — R42 Dizziness and giddiness: Secondary | ICD-10-CM | POA: Diagnosis not present

## 2017-04-03 DIAGNOSIS — I5022 Chronic systolic (congestive) heart failure: Secondary | ICD-10-CM | POA: Diagnosis not present

## 2017-04-03 MED ORDER — MECLIZINE HCL 25 MG PO TABS: ORAL_TABLET | ORAL | 3 refills | Status: DC

## 2017-04-03 MED ORDER — PREDNISONE 10 MG (21) PO TBPK: ORAL_TABLET | ORAL | 0 refills | Status: DC

## 2017-04-03 NOTE — Progress Notes (Signed)
Subjective: She still has dizziness and neck pain.  She was able to get up and move around in the room yesterday.  We discussed having further evaluation including MRI and neurology consultation and she is not interested in either of those.  She would prefer to go home.  She has had vertigo off and on for years.  Objective: Vital signs in last 24 hours: Temp:  [97.6 F (36.4 C)-98 F (36.7 C)] 98 F (36.7 C) (11/21 0601) Pulse Rate:  [71-77] 71 (11/21 0601) Resp:  [17-18] 18 (11/21 0601) BP: (133-136)/(46-56) 133/56 (11/21 0601) SpO2:  [93 %-94 %] 94 % (11/21 0601) Weight change:  Last BM Date: 04/02/17  Intake/Output from previous day: 11/20 0701 - 11/21 0700 In: 660 [P.O.:660] Out: 1250 [Urine:1250]  PHYSICAL EXAM General appearance: alert, cooperative and mild distress Resp: clear to auscultation bilaterally Cardio: regular rate and rhythm, S1, S2 normal, no murmur, click, rub or gallop GI: soft, non-tender; bowel sounds normal; no masses,  no organomegaly Extremities: extremities normal, atraumatic, no cyanosis or edema No nystagmus  Lab Results:  Results for orders placed or performed during the hospital encounter of 04/01/17 (from the past 48 hour(s))  Urinalysis, dipstick only     Status: Abnormal   Collection Time: 04/01/17  6:16 PM  Result Value Ref Range   Color, Urine YELLOW YELLOW    Comment: STAT   APPearance CLEAR CLEAR    Comment: STAT   Specific Gravity, Urine 1.015 1.005 - 1.030    Comment: STAT   pH 5.5 5.0 - 8.0    Comment: STAT   Glucose, UA NEGATIVE NEGATIVE mg/dL    Comment: STAT   Hgb urine dipstick NEGATIVE NEGATIVE    Comment: STAT   Bilirubin Urine NEGATIVE NEGATIVE    Comment: STAT   Ketones, ur 15 (A) NEGATIVE mg/dL    Comment: STAT   Protein, ur NEGATIVE NEGATIVE mg/dL    Comment: STAT   Nitrite NEGATIVE NEGATIVE    Comment: STAT   Leukocytes, UA SMALL (A) NEGATIVE    Comment: STAT  CBC with Differential/Platelet     Status: None    Collection Time: 04/01/17  6:39 PM  Result Value Ref Range   WBC 7.2 4.0 - 10.5 K/uL   RBC 4.87 3.87 - 5.11 MIL/uL   Hemoglobin 13.3 12.0 - 15.0 g/dL   HCT 41.5 36.0 - 46.0 %   MCV 85.2 78.0 - 100.0 fL   MCH 27.3 26.0 - 34.0 pg   MCHC 32.0 30.0 - 36.0 g/dL   RDW 13.4 11.5 - 15.5 %   Platelets 312 150 - 400 K/uL   Neutrophils Relative % 44 %   Neutro Abs 3.2 1.7 - 7.7 K/uL   Lymphocytes Relative 38 %   Lymphs Abs 2.8 0.7 - 4.0 K/uL   Monocytes Relative 12 %   Monocytes Absolute 0.8 0.1 - 1.0 K/uL   Eosinophils Relative 6 %   Eosinophils Absolute 0.5 0.0 - 0.7 K/uL   Basophils Relative 0 %   Basophils Absolute 0.0 0.0 - 0.1 K/uL  Comprehensive metabolic panel     Status: Abnormal   Collection Time: 04/01/17  6:39 PM  Result Value Ref Range   Sodium 136 135 - 145 mmol/L   Potassium 3.5 3.5 - 5.1 mmol/L   Chloride 105 101 - 111 mmol/L   CO2 24 22 - 32 mmol/L   Glucose, Bld 96 65 - 99 mg/dL   BUN 15 6 - 20 mg/dL  Creatinine, Ser 0.64 0.44 - 1.00 mg/dL   Calcium 9.8 8.9 - 10.3 mg/dL   Total Protein 6.8 6.5 - 8.1 g/dL   Albumin 3.9 3.5 - 5.0 g/dL   AST 18 15 - 41 U/L   ALT 10 (L) 14 - 54 U/L   Alkaline Phosphatase 67 38 - 126 U/L   Total Bilirubin 1.2 0.3 - 1.2 mg/dL   GFR calc non Af Amer >60 >60 mL/min   GFR calc Af Amer >60 >60 mL/min    Comment: (NOTE) The eGFR has been calculated using the CKD EPI equation. This calculation has not been validated in all clinical situations. eGFR's persistently <60 mL/min signify possible Chronic Kidney Disease.    Anion gap 7 5 - 15  CBC     Status: None   Collection Time: 04/02/17  5:49 AM  Result Value Ref Range   WBC 6.1 4.0 - 10.5 K/uL   RBC 4.54 3.87 - 5.11 MIL/uL   Hemoglobin 12.3 12.0 - 15.0 g/dL   HCT 38.9 36.0 - 46.0 %   MCV 85.7 78.0 - 100.0 fL   MCH 27.1 26.0 - 34.0 pg   MCHC 31.6 30.0 - 36.0 g/dL   RDW 13.5 11.5 - 15.5 %   Platelets 273 150 - 400 K/uL  Comprehensive metabolic panel     Status: Abnormal    Collection Time: 04/02/17  5:49 AM  Result Value Ref Range   Sodium 138 135 - 145 mmol/L   Potassium 3.8 3.5 - 5.1 mmol/L   Chloride 106 101 - 111 mmol/L   CO2 25 22 - 32 mmol/L   Glucose, Bld 85 65 - 99 mg/dL   BUN 17 6 - 20 mg/dL   Creatinine, Ser 0.66 0.44 - 1.00 mg/dL   Calcium 9.1 8.9 - 10.3 mg/dL   Total Protein 5.6 (L) 6.5 - 8.1 g/dL   Albumin 3.2 (L) 3.5 - 5.0 g/dL   AST 16 15 - 41 U/L   ALT 8 (L) 14 - 54 U/L   Alkaline Phosphatase 55 38 - 126 U/L   Total Bilirubin 0.8 0.3 - 1.2 mg/dL   GFR calc non Af Amer >60 >60 mL/min   GFR calc Af Amer >60 >60 mL/min    Comment: (NOTE) The eGFR has been calculated using the CKD EPI equation. This calculation has not been validated in all clinical situations. eGFR's persistently <60 mL/min signify possible Chronic Kidney Disease.    Anion gap 7 5 - 15  Magnesium     Status: None   Collection Time: 04/02/17  5:49 AM  Result Value Ref Range   Magnesium 2.0 1.7 - 2.4 mg/dL    ABGS No results for input(s): PHART, PO2ART, TCO2, HCO3 in the last 72 hours.  Invalid input(s): PCO2 CULTURES No results found for this or any previous visit (from the past 240 hour(s)). Studies/Results: Ct Angio Head W Or Wo Contrast  Result Date: 04/01/2017 CLINICAL DATA:  81 y/o F; felt a pop in the back of head a few days ago. Headache, neck pain, and dizziness. EXAM: CT ANGIOGRAPHY HEAD AND NECK TECHNIQUE: Multidetector CT imaging of the head and neck was performed using the standard protocol during bolus administration of intravenous contrast. Multiplanar CT image reconstructions and MIPs were obtained to evaluate the vascular anatomy. Carotid stenosis measurements (when applicable) are obtained utilizing NASCET criteria, using the distal internal carotid diameter as the denominator. CONTRAST:  60m ISOVUE-370 IOPAMIDOL (ISOVUE-370) INJECTION 76% COMPARISON:  06/24/2015 MRI head.  05/31/2015 CT head. 09/15/2009 thyroid ultrasound. FINDINGS: CT HEAD  FINDINGS Brain: Stable right paramedian parietal extra-axial 10 mm dense nodule compatible with meningioma. No evidence for large acute infarct, intracranial hemorrhage, or focal mass effect. Stable mild chronic microvascular ischemic changes and parenchymal volume loss of the brain. No hydrocephalus or extra-axial collection. Stable 7 mm cerebellar tonsillar herniation. Vascular: As below. Skull: Normal. Negative for fracture or focal lesion. Sinuses: Imaged portions are clear. Orbits: No acute finding. Review of the MIP images confirms the above findings CTA NECK FINDINGS Aortic arch: Standard branching. Imaged portion shows no evidence of aneurysm or dissection. No significant stenosis of the major arch vessel origins. Mild calcific atherosclerosis. Right carotid system: No evidence of dissection, stenosis (50% or greater) or occlusion. Mild calcific atherosclerosis of carotid bifurcation. Left carotid system: No evidence of dissection, stenosis (50% or greater) or occlusion. Mild calcific atherosclerosis of carotid bifurcation. Vertebral arteries: Codominant. No evidence of dissection, stenosis (50% or greater) or occlusion. Left V1 largely obscured by streak artifact from contrast bolus. Skeleton: C5-C7 anterior cervical discectomy infusion. Grade 1 anterolisthesis at C2-3 and C3-4. Mild disc and facet degenerative changes. No high-grade bony canal stenosis. Other neck: Stable 2.6 cm nodule within the right lobe of the thyroid gland. Upper chest: Negative. Review of the MIP images confirms the above findings CTA HEAD FINDINGS Anterior circulation: No significant stenosis, proximal occlusion, aneurysm, or vascular malformation. Posterior circulation: No significant stenosis, proximal occlusion, aneurysm, or vascular malformation. Venous sinuses: As permitted by contrast timing, patent. Anatomic variants: Left fetal PCA. Small patent anterior and right posterior communicating arteries. Delayed phase: No abnormal  enhancement of brain parenchyma. Review of the MIP images confirms the above findings IMPRESSION: 1. Patent carotid and vertebral arteries. No dissection, aneurysm, or hemodynamically significant stenosis utilizing NASCET criteria. 2. Patent circle of Willis. No large vessel occlusion, aneurysm, or significant stenosis. 3. Mild calcific atherosclerosis of aortic arch and carotid bifurcations. 4. Stable 2.6 cm nodule within right lobe of thyroid gland. 5. Stable 10 mm right parietal paramedian meningioma. 6. No acute intracranial abnormality on noncontrast CT of head. No abnormal enhancement of brain. 7. Mild chronic microvascular ischemic changes of the brain and parenchymal volume loss is stable. 8. Stable Chiari I malformation.  No hydrocephalus. Electronically Signed   By: Kristine Garbe M.D.   On: 04/01/2017 21:16   Ct Angio Neck W And/or Wo Contrast  Result Date: 04/01/2017 CLINICAL DATA:  81 y/o F; felt a pop in the back of head a few days ago. Headache, neck pain, and dizziness. EXAM: CT ANGIOGRAPHY HEAD AND NECK TECHNIQUE: Multidetector CT imaging of the head and neck was performed using the standard protocol during bolus administration of intravenous contrast. Multiplanar CT image reconstructions and MIPs were obtained to evaluate the vascular anatomy. Carotid stenosis measurements (when applicable) are obtained utilizing NASCET criteria, using the distal internal carotid diameter as the denominator. CONTRAST:  62m ISOVUE-370 IOPAMIDOL (ISOVUE-370) INJECTION 76% COMPARISON:  06/24/2015 MRI head. 05/31/2015 CT head. 09/15/2009 thyroid ultrasound. FINDINGS: CT HEAD FINDINGS Brain: Stable right paramedian parietal extra-axial 10 mm dense nodule compatible with meningioma. No evidence for large acute infarct, intracranial hemorrhage, or focal mass effect. Stable mild chronic microvascular ischemic changes and parenchymal volume loss of the brain. No hydrocephalus or extra-axial collection.  Stable 7 mm cerebellar tonsillar herniation. Vascular: As below. Skull: Normal. Negative for fracture or focal lesion. Sinuses: Imaged portions are clear. Orbits: No acute finding. Review of the MIP images confirms the above findings  CTA NECK FINDINGS Aortic arch: Standard branching. Imaged portion shows no evidence of aneurysm or dissection. No significant stenosis of the major arch vessel origins. Mild calcific atherosclerosis. Right carotid system: No evidence of dissection, stenosis (50% or greater) or occlusion. Mild calcific atherosclerosis of carotid bifurcation. Left carotid system: No evidence of dissection, stenosis (50% or greater) or occlusion. Mild calcific atherosclerosis of carotid bifurcation. Vertebral arteries: Codominant. No evidence of dissection, stenosis (50% or greater) or occlusion. Left V1 largely obscured by streak artifact from contrast bolus. Skeleton: C5-C7 anterior cervical discectomy infusion. Grade 1 anterolisthesis at C2-3 and C3-4. Mild disc and facet degenerative changes. No high-grade bony canal stenosis. Other neck: Stable 2.6 cm nodule within the right lobe of the thyroid gland. Upper chest: Negative. Review of the MIP images confirms the above findings CTA HEAD FINDINGS Anterior circulation: No significant stenosis, proximal occlusion, aneurysm, or vascular malformation. Posterior circulation: No significant stenosis, proximal occlusion, aneurysm, or vascular malformation. Venous sinuses: As permitted by contrast timing, patent. Anatomic variants: Left fetal PCA. Small patent anterior and right posterior communicating arteries. Delayed phase: No abnormal enhancement of brain parenchyma. Review of the MIP images confirms the above findings IMPRESSION: 1. Patent carotid and vertebral arteries. No dissection, aneurysm, or hemodynamically significant stenosis utilizing NASCET criteria. 2. Patent circle of Willis. No large vessel occlusion, aneurysm, or significant stenosis. 3. Mild  calcific atherosclerosis of aortic arch and carotid bifurcations. 4. Stable 2.6 cm nodule within right lobe of thyroid gland. 5. Stable 10 mm right parietal paramedian meningioma. 6. No acute intracranial abnormality on noncontrast CT of head. No abnormal enhancement of brain. 7. Mild chronic microvascular ischemic changes of the brain and parenchymal volume loss is stable. 8. Stable Chiari I malformation.  No hydrocephalus. Electronically Signed   By: Kristine Garbe M.D.   On: 04/01/2017 21:16    Medications:  Prior to Admission:  Medications Prior to Admission  Medication Sig Dispense Refill Last Dose  . albuterol (PROVENTIL) (2.5 MG/3ML) 0.083% nebulizer solution Take 3 mLs (2.5 mg total) by nebulization every 6 (six) hours as needed for wheezing or shortness of breath. 75 mL 12 unknown  . aspirin EC 81 MG tablet Take 81 mg by mouth every morning.    04/01/2017 at Unknown time  . atorvastatin (LIPITOR) 80 MG tablet Take 1 tablet (80 mg total) by mouth daily at 6 PM. 30 tablet 6 03/31/2017 at Unknown time  . carvedilol (COREG) 6.25 MG tablet TAKE ONE TABLET BY MOUTH 2 TIMES A DAY. 180 tablet 0 04/01/2017 at [place]  . clorazepate (TRANXENE) 7.5 MG tablet Take 7.5 mg by mouth daily as needed for anxiety. For nerves   03/31/2017 at Unknown time  . naproxen (NAPROSYN) 500 MG tablet Take 1 tablet by mouth 2 (two) times daily.   04/01/2017 at Unknown time  . nitroGLYCERIN (NITROSTAT) 0.4 MG SL tablet Place 1 tablet (0.4 mg total) under the tongue every 5 (five) minutes x 3 doses as needed for chest pain. 25 tablet 12 unknown  . prasugrel (EFFIENT) 10 MG TABS tablet Take 1 tablet (10 mg total) daily by mouth. 90 tablet 3 04/01/2017 at Unknown time  . PROAIR HFA 108 (90 BASE) MCG/ACT inhaler Inhale 2 puffs into the lungs every 6 (six) hours as needed for wheezing or shortness of breath.    04/01/2017 at Unknown time  . sacubitril-valsartan (ENTRESTO) 24-26 MG Take 1 tablet by mouth 2 (two) times  daily. 60 tablet 6 04/01/2017 at Unknown time  . traMADol (  ULTRAM) 50 MG tablet Take 50 mg by mouth daily as needed for moderate pain or severe pain. Maximum dose= 8 tablets per day   03/31/2017 at Unknown time   Scheduled: . aspirin EC  81 mg Oral Daily  . atorvastatin  80 mg Oral q1800  . carvedilol  6.25 mg Oral BID WC  . enoxaparin (LOVENOX) injection  40 mg Subcutaneous Q24H  . Influenza vac split quadrivalent PF  0.5 mL Intramuscular Tomorrow-1000  . meclizine  25 mg Oral Q6H  . pantoprazole  40 mg Oral Daily  . prasugrel  10 mg Oral Daily  . predniSONE  40 mg Oral Q breakfast  . sacubitril-valsartan  1 tablet Oral BID   Continuous:  XIP:PNDLOPRAFOA, nitroGLYCERIN, ondansetron **OR** ondansetron (ZOFRAN) IV, traMADol  Assesment: She has dizziness which I think is multifactorial including benign peripheral positional vertigo and chronic ischemia of the brain from known atherosclerotic disease.  She has heart failure but no symptoms now.  She has hypertension which is well controlled.  She has an ascending aortic aneurysm which is unchanged.  She has coronary disease but is not having any chest pain now. Principal Problem:   Dizziness Active Problems:   Ascending aortic aneurysm (HCC)   Chronic combined systolic and diastolic CHF (congestive heart failure) (HCC)   Essential hypertension   GERD (gastroesophageal reflux disease)   COPD (chronic obstructive pulmonary disease) (HCC)   CAD (coronary artery disease)    Plan: Discharge home with home health services    LOS: 0 days   Daylen Lipsky L 04/03/2017, 8:40 AM

## 2017-04-03 NOTE — Progress Notes (Signed)
Patient discharged home with instructions, given on medications,and follow up visits, patient verbalized understanding. Prescriptions sent to Pharmacy of choice documented on AVS. IV discontinued,catheter intact. Accompanied by staff to an awaiting vehicle.

## 2017-04-03 NOTE — Discharge Summary (Signed)
Physician Discharge Summary  Patient ID: [name] MRN: [id number] DOB/AGE: 1935-07-05 81 y.o. Primary Care Physician:Michon Kaczmarek, Percell Miller, MD Admit date: 04/01/2017 Discharge date: 04/03/2017    Discharge Diagnoses:   Principal Problem:   Dizziness Active Problems:   Ascending aortic aneurysm (HCC)   Chronic combined systolic and diastolic CHF (congestive heart failure) (HCC)   Essential hypertension   GERD (gastroesophageal reflux disease)   COPD (chronic obstructive pulmonary disease) (HCC)   CAD (coronary artery disease)   Allergies as of 04/03/2017      Reactions   Plavix [clopidogrel Bisulfate] Itching   Severe itching   Alprazolam Nausea And Vomiting   Codeine Nausea And Vomiting   Percodan [oxycodone-aspirin] Nausea And Vomiting   Valium Nausea And Vomiting      Medication List    TAKE these medications   aspirin EC 81 MG tablet Take 81 mg by mouth every morning.   atorvastatin 80 MG tablet Commonly known as:  LIPITOR Take 1 tablet (80 mg total) by mouth daily at 6 PM.   carvedilol 6.25 MG tablet Commonly known as:  COREG TAKE ONE TABLET BY MOUTH 2 TIMES A DAY.   clorazepate 7.5 MG tablet Commonly known as:  TRANXENE Take 7.5 mg by mouth daily as needed for anxiety. For nerves   meclizine 25 MG tablet Commonly known as:  ANTIVERT Take 1 or 2  Every 6 hours What changed:    how much to take  how to take this  when to take this  reasons to take this  additional instructions   naproxen 500 MG tablet Commonly known as:  NAPROSYN Take 1 tablet by mouth 2 (two) times daily.   nitroGLYCERIN 0.4 MG SL tablet Commonly known as:  NITROSTAT Place 1 tablet (0.4 mg total) under the tongue every 5 (five) minutes x 3 doses as needed for chest pain.   prasugrel 10 MG Tabs tablet Commonly known as:  EFFIENT Take 1 tablet (10 mg total) daily by mouth.   predniSONE 10 MG (21) Tbpk tablet Commonly known as:  STERAPRED UNI-PAK 21 TAB Take as directed    PROAIR HFA 108 (90 Base) MCG/ACT inhaler Generic drug:  albuterol Inhale 2 puffs into the lungs every 6 (six) hours as needed for wheezing or shortness of breath.   albuterol (2.5 MG/3ML) 0.083% nebulizer solution Commonly known as:  PROVENTIL Take 3 mLs (2.5 mg total) by nebulization every 6 (six) hours as needed for wheezing or shortness of breath.   sacubitril-valsartan 24-26 MG Commonly known as:  ENTRESTO Take 1 tablet by mouth 2 (two) times daily.   traMADol 50 MG tablet Commonly known as:  ULTRAM Take 50 mg by mouth daily as needed for moderate pain or severe pain. Maximum dose= 8 tablets per day       Discharged Condition: Improved    Consults: None  Significant Diagnostic Studies: Ct Angio Head W Or Wo Contrast  Result Date: 04/01/2017 CLINICAL DATA:  81 y/o F; felt a pop in the back of head a few days ago. Headache, neck pain, and dizziness. EXAM: CT ANGIOGRAPHY HEAD AND NECK TECHNIQUE: Multidetector CT imaging of the head and neck was performed using the standard protocol during bolus administration of intravenous contrast. Multiplanar CT image reconstructions and MIPs were obtained to evaluate the vascular anatomy. Carotid stenosis measurements (when applicable) are obtained utilizing NASCET criteria, using the distal internal carotid diameter as the denominator. CONTRAST:  50mL ISOVUE-370 IOPAMIDOL (ISOVUE-370) INJECTION 76% COMPARISON:  06/24/2015 MRI head.  05/31/2015 CT head. 09/15/2009 thyroid ultrasound. FINDINGS: CT HEAD FINDINGS Brain: Stable right paramedian parietal extra-axial 10 mm dense nodule compatible with meningioma. No evidence for large acute infarct, intracranial hemorrhage, or focal mass effect. Stable mild chronic microvascular ischemic changes and parenchymal volume loss of the brain. No hydrocephalus or extra-axial collection. Stable 7 mm cerebellar tonsillar herniation. Vascular: As below. Skull: Normal. Negative for fracture or focal lesion.  Sinuses: Imaged portions are clear. Orbits: No acute finding. Review of the MIP images confirms the above findings CTA NECK FINDINGS Aortic arch: Standard branching. Imaged portion shows no evidence of aneurysm or dissection. No significant stenosis of the major arch vessel origins. Mild calcific atherosclerosis. Right carotid system: No evidence of dissection, stenosis (50% or greater) or occlusion. Mild calcific atherosclerosis of carotid bifurcation. Left carotid system: No evidence of dissection, stenosis (50% or greater) or occlusion. Mild calcific atherosclerosis of carotid bifurcation. Vertebral arteries: Codominant. No evidence of dissection, stenosis (50% or greater) or occlusion. Left V1 largely obscured by streak artifact from contrast bolus. Skeleton: C5-C7 anterior cervical discectomy infusion. Grade 1 anterolisthesis at C2-3 and C3-4. Mild disc and facet degenerative changes. No high-grade bony canal stenosis. Other neck: Stable 2.6 cm nodule within the right lobe of the thyroid gland. Upper chest: Negative. Review of the MIP images confirms the above findings CTA HEAD FINDINGS Anterior circulation: No significant stenosis, proximal occlusion, aneurysm, or vascular malformation. Posterior circulation: No significant stenosis, proximal occlusion, aneurysm, or vascular malformation. Venous sinuses: As permitted by contrast timing, patent. Anatomic variants: Left fetal PCA. Small patent anterior and right posterior communicating arteries. Delayed phase: No abnormal enhancement of brain parenchyma. Review of the MIP images confirms the above findings IMPRESSION: 1. Patent carotid and vertebral arteries. No dissection, aneurysm, or hemodynamically significant stenosis utilizing NASCET criteria. 2. Patent circle of Willis. No large vessel occlusion, aneurysm, or significant stenosis. 3. Mild calcific atherosclerosis of aortic arch and carotid bifurcations. 4. Stable 2.6 cm nodule within right lobe of thyroid  gland. 5. Stable 10 mm right parietal paramedian meningioma. 6. No acute intracranial abnormality on noncontrast CT of head. No abnormal enhancement of brain. 7. Mild chronic microvascular ischemic changes of the brain and parenchymal volume loss is stable. 8. Stable Chiari I malformation.  No hydrocephalus. Electronically Signed   By: Kristine Garbe M.D.   On: 04/01/2017 21:16   Ct Angio Neck W And/or Wo Contrast  Result Date: 04/01/2017 CLINICAL DATA:  81 y/o F; felt a pop in the back of head a few days ago. Headache, neck pain, and dizziness. EXAM: CT ANGIOGRAPHY HEAD AND NECK TECHNIQUE: Multidetector CT imaging of the head and neck was performed using the standard protocol during bolus administration of intravenous contrast. Multiplanar CT image reconstructions and MIPs were obtained to evaluate the vascular anatomy. Carotid stenosis measurements (when applicable) are obtained utilizing NASCET criteria, using the distal internal carotid diameter as the denominator. CONTRAST:  74mL ISOVUE-370 IOPAMIDOL (ISOVUE-370) INJECTION 76% COMPARISON:  06/24/2015 MRI head. 05/31/2015 CT head. 09/15/2009 thyroid ultrasound. FINDINGS: CT HEAD FINDINGS Brain: Stable right paramedian parietal extra-axial 10 mm dense nodule compatible with meningioma. No evidence for large acute infarct, intracranial hemorrhage, or focal mass effect. Stable mild chronic microvascular ischemic changes and parenchymal volume loss of the brain. No hydrocephalus or extra-axial collection. Stable 7 mm cerebellar tonsillar herniation. Vascular: As below. Skull: Normal. Negative for fracture or focal lesion. Sinuses: Imaged portions are clear. Orbits: No acute finding. Review of the MIP images confirms the above findings CTA  NECK FINDINGS Aortic arch: Standard branching. Imaged portion shows no evidence of aneurysm or dissection. No significant stenosis of the major arch vessel origins. Mild calcific atherosclerosis. Right carotid  system: No evidence of dissection, stenosis (50% or greater) or occlusion. Mild calcific atherosclerosis of carotid bifurcation. Left carotid system: No evidence of dissection, stenosis (50% or greater) or occlusion. Mild calcific atherosclerosis of carotid bifurcation. Vertebral arteries: Codominant. No evidence of dissection, stenosis (50% or greater) or occlusion. Left V1 largely obscured by streak artifact from contrast bolus. Skeleton: C5-C7 anterior cervical discectomy infusion. Grade 1 anterolisthesis at C2-3 and C3-4. Mild disc and facet degenerative changes. No high-grade bony canal stenosis. Other neck: Stable 2.6 cm nodule within the right lobe of the thyroid gland. Upper chest: Negative. Review of the MIP images confirms the above findings CTA HEAD FINDINGS Anterior circulation: No significant stenosis, proximal occlusion, aneurysm, or vascular malformation. Posterior circulation: No significant stenosis, proximal occlusion, aneurysm, or vascular malformation. Venous sinuses: As permitted by contrast timing, patent. Anatomic variants: Left fetal PCA. Small patent anterior and right posterior communicating arteries. Delayed phase: No abnormal enhancement of brain parenchyma. Review of the MIP images confirms the above findings IMPRESSION: 1. Patent carotid and vertebral arteries. No dissection, aneurysm, or hemodynamically significant stenosis utilizing NASCET criteria. 2. Patent circle of Willis. No large vessel occlusion, aneurysm, or significant stenosis. 3. Mild calcific atherosclerosis of aortic arch and carotid bifurcations. 4. Stable 2.6 cm nodule within right lobe of thyroid gland. 5. Stable 10 mm right parietal paramedian meningioma. 6. No acute intracranial abnormality on noncontrast CT of head. No abnormal enhancement of brain. 7. Mild chronic microvascular ischemic changes of the brain and parenchymal volume loss is stable. 8. Stable Chiari I malformation.  No hydrocephalus. Electronically  Signed   By: Kristine Garbe M.D.   On: 04/01/2017 21:16    Lab Results: Basic Metabolic Panel: Recent Labs    04/01/17 1839 04/02/17 0549  NA 136 138  K 3.5 3.8  CL 105 106  CO2 24 25  GLUCOSE 96 85  BUN 15 17  CREATININE 0.64 0.66  CALCIUM 9.8 9.1  MG  --  2.0   Liver Function Tests: Recent Labs    04/01/17 1839 04/02/17 0549  AST 18 16  ALT 10* 8*  ALKPHOS 67 55  BILITOT 1.2 0.8  PROT 6.8 5.6*  ALBUMIN 3.9 3.2*     CBC: Recent Labs    04/01/17 1839 04/02/17 0549  WBC 7.2 6.1  NEUTROABS 3.2  --   HGB 13.3 12.3  HCT 41.5 38.9  MCV 85.2 85.7  PLT 312 273    No results found for this or any previous visit (from the past 240 hour(s)).   Hospital Course: This is a 81 year old who came to the emergency department with dizziness.  She also had neck pain.  She had CT of the brain and neck which showed chronic changes particularly chronic ischemic changes but no acute abnormalities.  She was started on Antivert and steroids and improved but was still having trouble with dizziness.  We discussed having further workup including MRI of the brain and neurology consultation but she wanted to go home rather than do those.  She has had episodes of dizziness for many years.  She has been able to get up and move around in the room now.  Discharge Exam: Blood pressure (!) 133/56, pulse 71, temperature 98 F (36.7 C), temperature source Oral, resp. rate 18, height 5\' 2"  (1.575 m), weight 73.9  kg (162 lb 14.7 oz), SpO2 94 %. She is awake and alert.  Chest is clear.  She does not show nystagmus to lateral gaze.  Disposition: Home with home health services  Discharge Instructions    Face-to-face encounter (required for Medicare/Medicaid patients)   Complete by:  As directed    I Betha Shadix L certify that this patient is under my care and that I, or a nurse practitioner or physician's assistant working with me, had a face-to-face encounter that meets the physician  face-to-face encounter requirements with this patient on 04/03/2017. The encounter with the patient was in whole, or in part for the following medical condition(s) which is the primary reason for home health care (List medical condition): vertigo   The encounter with the patient was in whole, or in part, for the following medical condition, which is the primary reason for home health care:  vertigo   I certify that, based on my findings, the following services are medically necessary home health services:   Nursing Physical therapy     Reason for Medically Necessary Home Health Services:  Skilled Nursing- Change/Decline in Patient Status   My clinical findings support the need for the above services:  Unsafe ambulation due to balance issues   Further, I certify that my clinical findings support that this patient is homebound due to:  Unsafe ambulation due to balance issues   Home Health   Complete by:  As directed    To provide the following care/treatments:   PT RN          Signed: Cande Mastropietro L   04/03/2017, 8:42 AM

## 2017-04-03 NOTE — Care Management Note (Signed)
Case Management Note  Patient Details  Name: [name] MRN: [id number] Date of Birth: 1936/03/18   Assessment/Plan:    Observatin for dizziness. Pt from home, ind with ADL's. Pt has PCP, transportation to appointments and insurance with drug coverage. MD has ordered [facility] RN/PT and pt agreeable, she has chosen [facility] from list of providers. She is aware HH has 48 hrs to make first visit. She has no new DME needs pta. [facility] rep, aware of referral and will pull pt info from chart.             Expected Discharge Date:  04/03/17               Expected Discharge Plan:  [place]  In-House Referral:  NA  Discharge planning Services  CM Consult  Post Acute Care Choice:  Home Health Choice offered to:  Patient  HH Arranged:  RN, PT [facility] Agency:  [place]  Status of Service:  Completed, signed off  Sherald Barge, RN 04/03/2017, 10:34 AM

## 2017-04-04 DIAGNOSIS — M1991 Primary osteoarthritis, unspecified site: Secondary | ICD-10-CM | POA: Diagnosis not present

## 2017-04-04 DIAGNOSIS — Z7902 Long term (current) use of antithrombotics/antiplatelets: Secondary | ICD-10-CM | POA: Diagnosis not present

## 2017-04-04 DIAGNOSIS — F419 Anxiety disorder, unspecified: Secondary | ICD-10-CM | POA: Diagnosis not present

## 2017-04-04 DIAGNOSIS — Z87891 Personal history of nicotine dependence: Secondary | ICD-10-CM | POA: Diagnosis not present

## 2017-04-04 DIAGNOSIS — I5042 Chronic combined systolic (congestive) and diastolic (congestive) heart failure: Secondary | ICD-10-CM | POA: Diagnosis not present

## 2017-04-04 DIAGNOSIS — Z955 Presence of coronary angioplasty implant and graft: Secondary | ICD-10-CM | POA: Diagnosis not present

## 2017-04-04 DIAGNOSIS — Z7982 Long term (current) use of aspirin: Secondary | ICD-10-CM | POA: Diagnosis not present

## 2017-04-04 DIAGNOSIS — M5000 Cervical disc disorder with myelopathy, unspecified cervical region: Secondary | ICD-10-CM | POA: Diagnosis not present

## 2017-04-04 DIAGNOSIS — I110 Hypertensive heart disease with heart failure: Secondary | ICD-10-CM | POA: Diagnosis not present

## 2017-04-04 DIAGNOSIS — I712 Thoracic aortic aneurysm, without rupture: Secondary | ICD-10-CM | POA: Diagnosis not present

## 2017-04-04 DIAGNOSIS — J449 Chronic obstructive pulmonary disease, unspecified: Secondary | ICD-10-CM | POA: Diagnosis not present

## 2017-04-04 DIAGNOSIS — I2510 Atherosclerotic heart disease of native coronary artery without angina pectoris: Secondary | ICD-10-CM | POA: Diagnosis not present

## 2017-04-04 DIAGNOSIS — I428 Other cardiomyopathies: Secondary | ICD-10-CM | POA: Diagnosis not present

## 2017-04-04 DIAGNOSIS — R42 Dizziness and giddiness: Secondary | ICD-10-CM | POA: Diagnosis not present

## 2017-04-04 DIAGNOSIS — K219 Gastro-esophageal reflux disease without esophagitis: Secondary | ICD-10-CM | POA: Diagnosis not present

## 2017-04-04 DIAGNOSIS — G935 Compression of brain: Secondary | ICD-10-CM | POA: Diagnosis not present

## 2017-04-08 DIAGNOSIS — I5042 Chronic combined systolic (congestive) and diastolic (congestive) heart failure: Secondary | ICD-10-CM | POA: Diagnosis not present

## 2017-04-08 DIAGNOSIS — R42 Dizziness and giddiness: Secondary | ICD-10-CM | POA: Diagnosis not present

## 2017-04-08 DIAGNOSIS — I712 Thoracic aortic aneurysm, without rupture: Secondary | ICD-10-CM | POA: Diagnosis not present

## 2017-04-08 DIAGNOSIS — I2510 Atherosclerotic heart disease of native coronary artery without angina pectoris: Secondary | ICD-10-CM | POA: Diagnosis not present

## 2017-04-08 DIAGNOSIS — I110 Hypertensive heart disease with heart failure: Secondary | ICD-10-CM | POA: Diagnosis not present

## 2017-04-08 DIAGNOSIS — J449 Chronic obstructive pulmonary disease, unspecified: Secondary | ICD-10-CM | POA: Diagnosis not present

## 2017-04-11 DIAGNOSIS — I110 Hypertensive heart disease with heart failure: Secondary | ICD-10-CM | POA: Diagnosis not present

## 2017-04-11 DIAGNOSIS — I5042 Chronic combined systolic (congestive) and diastolic (congestive) heart failure: Secondary | ICD-10-CM | POA: Diagnosis not present

## 2017-04-11 DIAGNOSIS — I712 Thoracic aortic aneurysm, without rupture: Secondary | ICD-10-CM | POA: Diagnosis not present

## 2017-04-11 DIAGNOSIS — I2510 Atherosclerotic heart disease of native coronary artery without angina pectoris: Secondary | ICD-10-CM | POA: Diagnosis not present

## 2017-04-11 DIAGNOSIS — R42 Dizziness and giddiness: Secondary | ICD-10-CM | POA: Diagnosis not present

## 2017-04-11 DIAGNOSIS — M5000 Cervical disc disorder with myelopathy, unspecified cervical region: Secondary | ICD-10-CM | POA: Diagnosis not present

## 2017-04-11 DIAGNOSIS — J449 Chronic obstructive pulmonary disease, unspecified: Secondary | ICD-10-CM | POA: Diagnosis not present

## 2017-04-11 DIAGNOSIS — I10 Essential (primary) hypertension: Secondary | ICD-10-CM | POA: Diagnosis not present

## 2017-04-12 DIAGNOSIS — I110 Hypertensive heart disease with heart failure: Secondary | ICD-10-CM | POA: Diagnosis not present

## 2017-04-12 DIAGNOSIS — R42 Dizziness and giddiness: Secondary | ICD-10-CM | POA: Diagnosis not present

## 2017-04-12 DIAGNOSIS — I2510 Atherosclerotic heart disease of native coronary artery without angina pectoris: Secondary | ICD-10-CM | POA: Diagnosis not present

## 2017-04-12 DIAGNOSIS — I5042 Chronic combined systolic (congestive) and diastolic (congestive) heart failure: Secondary | ICD-10-CM | POA: Diagnosis not present

## 2017-04-12 DIAGNOSIS — J449 Chronic obstructive pulmonary disease, unspecified: Secondary | ICD-10-CM | POA: Diagnosis not present

## 2017-04-12 DIAGNOSIS — I712 Thoracic aortic aneurysm, without rupture: Secondary | ICD-10-CM | POA: Diagnosis not present

## 2017-04-15 ENCOUNTER — Other Ambulatory Visit: Payer: Self-pay | Admitting: Cardiovascular Disease

## 2017-04-16 DIAGNOSIS — I2510 Atherosclerotic heart disease of native coronary artery without angina pectoris: Secondary | ICD-10-CM | POA: Diagnosis not present

## 2017-04-16 DIAGNOSIS — R42 Dizziness and giddiness: Secondary | ICD-10-CM | POA: Diagnosis not present

## 2017-04-16 DIAGNOSIS — I110 Hypertensive heart disease with heart failure: Secondary | ICD-10-CM | POA: Diagnosis not present

## 2017-04-16 DIAGNOSIS — I5042 Chronic combined systolic (congestive) and diastolic (congestive) heart failure: Secondary | ICD-10-CM | POA: Diagnosis not present

## 2017-04-16 DIAGNOSIS — J449 Chronic obstructive pulmonary disease, unspecified: Secondary | ICD-10-CM | POA: Diagnosis not present

## 2017-04-16 DIAGNOSIS — I712 Thoracic aortic aneurysm, without rupture: Secondary | ICD-10-CM | POA: Diagnosis not present

## 2017-04-17 ENCOUNTER — Other Ambulatory Visit (HOSPITAL_COMMUNITY): Payer: Self-pay | Admitting: Pulmonary Disease

## 2017-04-17 DIAGNOSIS — I5042 Chronic combined systolic (congestive) and diastolic (congestive) heart failure: Secondary | ICD-10-CM | POA: Diagnosis not present

## 2017-04-17 DIAGNOSIS — R42 Dizziness and giddiness: Secondary | ICD-10-CM | POA: Diagnosis not present

## 2017-04-17 DIAGNOSIS — J449 Chronic obstructive pulmonary disease, unspecified: Secondary | ICD-10-CM | POA: Diagnosis not present

## 2017-04-17 DIAGNOSIS — I2510 Atherosclerotic heart disease of native coronary artery without angina pectoris: Secondary | ICD-10-CM | POA: Diagnosis not present

## 2017-04-17 DIAGNOSIS — I110 Hypertensive heart disease with heart failure: Secondary | ICD-10-CM | POA: Diagnosis not present

## 2017-04-17 DIAGNOSIS — I712 Thoracic aortic aneurysm, without rupture: Secondary | ICD-10-CM | POA: Diagnosis not present

## 2017-04-18 DIAGNOSIS — I110 Hypertensive heart disease with heart failure: Secondary | ICD-10-CM | POA: Diagnosis not present

## 2017-04-18 DIAGNOSIS — I712 Thoracic aortic aneurysm, without rupture: Secondary | ICD-10-CM | POA: Diagnosis not present

## 2017-04-18 DIAGNOSIS — I2510 Atherosclerotic heart disease of native coronary artery without angina pectoris: Secondary | ICD-10-CM | POA: Diagnosis not present

## 2017-04-18 DIAGNOSIS — J449 Chronic obstructive pulmonary disease, unspecified: Secondary | ICD-10-CM | POA: Diagnosis not present

## 2017-04-18 DIAGNOSIS — I5042 Chronic combined systolic (congestive) and diastolic (congestive) heart failure: Secondary | ICD-10-CM | POA: Diagnosis not present

## 2017-04-18 DIAGNOSIS — R42 Dizziness and giddiness: Secondary | ICD-10-CM | POA: Diagnosis not present

## 2017-04-24 DIAGNOSIS — J449 Chronic obstructive pulmonary disease, unspecified: Secondary | ICD-10-CM | POA: Diagnosis not present

## 2017-04-24 DIAGNOSIS — I5042 Chronic combined systolic (congestive) and diastolic (congestive) heart failure: Secondary | ICD-10-CM | POA: Diagnosis not present

## 2017-04-24 DIAGNOSIS — I2510 Atherosclerotic heart disease of native coronary artery without angina pectoris: Secondary | ICD-10-CM | POA: Diagnosis not present

## 2017-04-24 DIAGNOSIS — I712 Thoracic aortic aneurysm, without rupture: Secondary | ICD-10-CM | POA: Diagnosis not present

## 2017-04-24 DIAGNOSIS — R42 Dizziness and giddiness: Secondary | ICD-10-CM | POA: Diagnosis not present

## 2017-04-24 DIAGNOSIS — I110 Hypertensive heart disease with heart failure: Secondary | ICD-10-CM | POA: Diagnosis not present

## 2017-04-26 ENCOUNTER — Ambulatory Visit (HOSPITAL_COMMUNITY): Payer: Medicare Other

## 2017-04-26 DIAGNOSIS — J449 Chronic obstructive pulmonary disease, unspecified: Secondary | ICD-10-CM | POA: Diagnosis not present

## 2017-04-26 DIAGNOSIS — R42 Dizziness and giddiness: Secondary | ICD-10-CM | POA: Diagnosis not present

## 2017-04-26 DIAGNOSIS — I712 Thoracic aortic aneurysm, without rupture: Secondary | ICD-10-CM | POA: Diagnosis not present

## 2017-04-26 DIAGNOSIS — I110 Hypertensive heart disease with heart failure: Secondary | ICD-10-CM | POA: Diagnosis not present

## 2017-04-26 DIAGNOSIS — I2510 Atherosclerotic heart disease of native coronary artery without angina pectoris: Secondary | ICD-10-CM | POA: Diagnosis not present

## 2017-04-26 DIAGNOSIS — I5042 Chronic combined systolic (congestive) and diastolic (congestive) heart failure: Secondary | ICD-10-CM | POA: Diagnosis not present

## 2017-04-29 DIAGNOSIS — I712 Thoracic aortic aneurysm, without rupture: Secondary | ICD-10-CM | POA: Diagnosis not present

## 2017-04-29 DIAGNOSIS — R42 Dizziness and giddiness: Secondary | ICD-10-CM | POA: Diagnosis not present

## 2017-04-29 DIAGNOSIS — I110 Hypertensive heart disease with heart failure: Secondary | ICD-10-CM | POA: Diagnosis not present

## 2017-04-29 DIAGNOSIS — I2510 Atherosclerotic heart disease of native coronary artery without angina pectoris: Secondary | ICD-10-CM | POA: Diagnosis not present

## 2017-04-29 DIAGNOSIS — I5042 Chronic combined systolic (congestive) and diastolic (congestive) heart failure: Secondary | ICD-10-CM | POA: Diagnosis not present

## 2017-04-29 DIAGNOSIS — J449 Chronic obstructive pulmonary disease, unspecified: Secondary | ICD-10-CM | POA: Diagnosis not present

## 2017-05-03 ENCOUNTER — Encounter (HOSPITAL_COMMUNITY): Payer: Self-pay

## 2017-05-03 ENCOUNTER — Ambulatory Visit (HOSPITAL_COMMUNITY)
Admission: RE | Admit: 2017-05-03 | Discharge: 2017-05-03 | Disposition: A | Discharge status: Home / Self Care | Payer: Medicare Other | Source: Ambulatory Visit | Attending: Pulmonary Disease | Admitting: Pulmonary Disease

## 2017-05-03 DIAGNOSIS — R42 Dizziness and giddiness: Secondary | ICD-10-CM

## 2017-05-08 DIAGNOSIS — J449 Chronic obstructive pulmonary disease, unspecified: Secondary | ICD-10-CM | POA: Diagnosis not present

## 2017-05-08 DIAGNOSIS — I5042 Chronic combined systolic (congestive) and diastolic (congestive) heart failure: Secondary | ICD-10-CM | POA: Diagnosis not present

## 2017-05-08 DIAGNOSIS — I712 Thoracic aortic aneurysm, without rupture: Secondary | ICD-10-CM | POA: Diagnosis not present

## 2017-05-08 DIAGNOSIS — R42 Dizziness and giddiness: Secondary | ICD-10-CM | POA: Diagnosis not present

## 2017-05-08 DIAGNOSIS — I110 Hypertensive heart disease with heart failure: Secondary | ICD-10-CM | POA: Diagnosis not present

## 2017-05-08 DIAGNOSIS — I2510 Atherosclerotic heart disease of native coronary artery without angina pectoris: Secondary | ICD-10-CM | POA: Diagnosis not present

## 2017-05-10 ENCOUNTER — Other Ambulatory Visit: Payer: Self-pay

## 2017-05-10 ENCOUNTER — Emergency Department (HOSPITAL_COMMUNITY): Payer: Medicare Other

## 2017-05-10 ENCOUNTER — Inpatient Hospital Stay (HOSPITAL_COMMUNITY)
Admission: EM | Admit: 2017-05-10 | Discharge: 2017-05-13 | DRG: 191 | Disposition: A | Discharge status: Home Health | Payer: Medicare Other | Attending: Pulmonary Disease | Admitting: Pulmonary Disease

## 2017-05-10 ENCOUNTER — Encounter (HOSPITAL_COMMUNITY): Payer: Self-pay | Admitting: Emergency Medicine

## 2017-05-10 DIAGNOSIS — R05 Cough: Secondary | ICD-10-CM | POA: Diagnosis not present

## 2017-05-10 DIAGNOSIS — Z7901 Long term (current) use of anticoagulants: Secondary | ICD-10-CM | POA: Diagnosis not present

## 2017-05-10 DIAGNOSIS — F419 Anxiety disorder, unspecified: Secondary | ICD-10-CM | POA: Diagnosis not present

## 2017-05-10 DIAGNOSIS — R0682 Tachypnea, not elsewhere classified: Secondary | ICD-10-CM | POA: Diagnosis not present

## 2017-05-10 DIAGNOSIS — R0602 Shortness of breath: Secondary | ICD-10-CM | POA: Diagnosis not present

## 2017-05-10 DIAGNOSIS — Z8249 Family history of ischemic heart disease and other diseases of the circulatory system: Secondary | ICD-10-CM | POA: Diagnosis not present

## 2017-05-10 DIAGNOSIS — M1990 Unspecified osteoarthritis, unspecified site: Secondary | ICD-10-CM | POA: Diagnosis present

## 2017-05-10 DIAGNOSIS — J441 Chronic obstructive pulmonary disease with (acute) exacerbation: Principal | ICD-10-CM

## 2017-05-10 DIAGNOSIS — J449 Chronic obstructive pulmonary disease, unspecified: Secondary | ICD-10-CM | POA: Diagnosis present

## 2017-05-10 DIAGNOSIS — Z791 Long term (current) use of non-steroidal anti-inflammatories (NSAID): Secondary | ICD-10-CM

## 2017-05-10 DIAGNOSIS — Z7982 Long term (current) use of aspirin: Secondary | ICD-10-CM

## 2017-05-10 DIAGNOSIS — Z87442 Personal history of urinary calculi: Secondary | ICD-10-CM

## 2017-05-10 DIAGNOSIS — I252 Old myocardial infarction: Secondary | ICD-10-CM

## 2017-05-10 DIAGNOSIS — I447 Left bundle-branch block, unspecified: Secondary | ICD-10-CM | POA: Diagnosis present

## 2017-05-10 DIAGNOSIS — I429 Cardiomyopathy, unspecified: Secondary | ICD-10-CM | POA: Diagnosis present

## 2017-05-10 DIAGNOSIS — K219 Gastro-esophageal reflux disease without esophagitis: Secondary | ICD-10-CM

## 2017-05-10 DIAGNOSIS — Z79899 Other long term (current) drug therapy: Secondary | ICD-10-CM

## 2017-05-10 DIAGNOSIS — Z885 Allergy status to narcotic agent status: Secondary | ICD-10-CM

## 2017-05-10 DIAGNOSIS — I110 Hypertensive heart disease with heart failure: Secondary | ICD-10-CM | POA: Diagnosis present

## 2017-05-10 DIAGNOSIS — I10 Essential (primary) hypertension: Secondary | ICD-10-CM

## 2017-05-10 DIAGNOSIS — Z87891 Personal history of nicotine dependence: Secondary | ICD-10-CM | POA: Diagnosis not present

## 2017-05-10 DIAGNOSIS — I2510 Atherosclerotic heart disease of native coronary artery without angina pectoris: Secondary | ICD-10-CM | POA: Diagnosis present

## 2017-05-10 DIAGNOSIS — Z888 Allergy status to other drugs, medicaments and biological substances status: Secondary | ICD-10-CM | POA: Diagnosis not present

## 2017-05-10 DIAGNOSIS — J432 Centrilobular emphysema: Secondary | ICD-10-CM

## 2017-05-10 DIAGNOSIS — I5042 Chronic combined systolic (congestive) and diastolic (congestive) heart failure: Secondary | ICD-10-CM

## 2017-05-10 LAB — CBC WITH DIFFERENTIAL/PLATELET
Basophils Absolute: 0 10*3/uL (ref 0.0–0.1)
Basophils Relative: 0 %
Eosinophils Absolute: 0.7 10*3/uL (ref 0.0–0.7)
Eosinophils Relative: 9 %
HCT: 41.3 % (ref 36.0–46.0)
Hemoglobin: 12.8 g/dL (ref 12.0–15.0)
Lymphocytes Relative: 18 %
Lymphs Abs: 1.4 10*3/uL (ref 0.7–4.0)
MCH: 26.3 pg (ref 26.0–34.0)
MCHC: 31 g/dL (ref 30.0–36.0)
MCV: 85 fL (ref 78.0–100.0)
Monocytes Absolute: 0.4 10*3/uL (ref 0.1–1.0)
Monocytes Relative: 4 %
Neutro Abs: 5.6 10*3/uL (ref 1.7–7.7)
Neutrophils Relative %: 69 %
Platelets: 310 10*3/uL (ref 150–400)
RBC: 4.86 MIL/uL (ref 3.87–5.11)
RDW: 13.4 % (ref 11.5–15.5)
WBC: 8.1 10*3/uL (ref 4.0–10.5)

## 2017-05-10 LAB — RESPIRATORY PANEL BY PCR

## 2017-05-10 LAB — COMPREHENSIVE METABOLIC PANEL
ALT: 26 U/L (ref 14–54)
AST: 33 U/L (ref 15–41)
Albumin: 3.9 g/dL (ref 3.5–5.0)
Alkaline Phosphatase: 83 U/L (ref 38–126)
Anion gap: 9 (ref 5–15)
BUN: 14 mg/dL (ref 6–20)
CO2: 28 mmol/L (ref 22–32)
Calcium: 9.6 mg/dL (ref 8.9–10.3)
Chloride: 102 mmol/L (ref 101–111)
Creatinine, Ser: 0.57 mg/dL (ref 0.44–1.00)
GFR calc Af Amer: >60 mL/min (ref >60)
GFR calc non Af Amer: >60 mL/min (ref >60)
Glucose, Bld: 120 mg/dL — ABNORMAL HIGH (ref 65–99)
Potassium: 4.3 mmol/L (ref 3.5–5.1)
Sodium: 139 mmol/L (ref 135–145)
Total Bilirubin: 0.8 mg/dL (ref 0.3–1.2)
Total Protein: 6.8 g/dL (ref 6.5–8.1)

## 2017-05-10 LAB — TROPONIN I
Troponin I: <0.03 ng/mL (ref <0.03)
Troponin I: <0.03 ng/mL (ref <0.03)

## 2017-05-10 LAB — LIPASE, BLOOD: Lipase: 20 U/L (ref 11–51)

## 2017-05-10 MED ORDER — CLORAZEPATE DIPOTASSIUM 7.5 MG PO TABS
7.5000 mg | ORAL_TABLET | Freq: Every day | ORAL | Status: DC | PRN
  Administered 2017-05-12: 7.5 mg via ORAL
  Filled 2017-05-10: qty 1

## 2017-05-10 MED ORDER — PREDNISONE 20 MG PO TABS
40.0000 mg | ORAL_TABLET | Freq: Every day | ORAL | Status: DC
  Filled 2017-05-10: qty 2

## 2017-05-10 MED ORDER — GUAIFENESIN ER 600 MG PO TB12
600.0000 mg | ORAL_TABLET | Freq: Two times a day (BID) | ORAL | Status: DC | PRN
  Administered 2017-05-11: 600 mg via ORAL
  Filled 2017-05-10: qty 1

## 2017-05-10 MED ORDER — PRASUGREL HCL 10 MG PO TABS
10.0000 mg | ORAL_TABLET | Freq: Every day | ORAL | Status: DC
  Administered 2017-05-11 – 2017-05-12 (×2): 10 mg via ORAL
  Filled 2017-05-10 (×3): qty 1

## 2017-05-10 MED ORDER — ONDANSETRON HCL 4 MG/2ML IJ SOLN: 4.0000 mg | Freq: Four times a day (QID) | INTRAMUSCULAR | Status: DC | PRN

## 2017-05-10 MED ORDER — NITROGLYCERIN 0.4 MG SL SUBL: 0.4000 mg | SUBLINGUAL_TABLET | SUBLINGUAL | Status: DC | PRN

## 2017-05-10 MED ORDER — BISACODYL 5 MG PO TBEC: 5.0000 mg | DELAYED_RELEASE_TABLET | Freq: Every day | ORAL | Status: DC | PRN

## 2017-05-10 MED ORDER — TRAMADOL HCL 50 MG PO TABS: 50.0000 mg | ORAL_TABLET | Freq: Every day | ORAL | Status: DC | PRN

## 2017-05-10 MED ORDER — SACUBITRIL-VALSARTAN 24-26 MG PO TABS
1.0000 | ORAL_TABLET | Freq: Two times a day (BID) | ORAL | Status: DC
  Administered 2017-05-10 – 2017-05-12 (×5): 1 via ORAL
  Filled 2017-05-10 (×6): qty 1

## 2017-05-10 MED ORDER — SENNOSIDES-DOCUSATE SODIUM 8.6-50 MG PO TABS: 1.0000 | ORAL_TABLET | Freq: Every evening | ORAL | Status: DC | PRN

## 2017-05-10 MED ORDER — ALBUTEROL (5 MG/ML) CONTINUOUS INHALATION SOLN
10.0000 mg/h | INHALATION_SOLUTION | RESPIRATORY_TRACT | Status: DC
  Administered 2017-05-10: 10 mg/h via RESPIRATORY_TRACT
  Filled 2017-05-10: qty 20

## 2017-05-10 MED ORDER — CARVEDILOL 3.125 MG PO TABS
6.2500 mg | ORAL_TABLET | Freq: Two times a day (BID) | ORAL | Status: DC
  Administered 2017-05-10 – 2017-05-13 (×6): 6.25 mg via ORAL
  Filled 2017-05-10 (×6): qty 2

## 2017-05-10 MED ORDER — DEXTROSE 5 % IV SOLN
1.0000 g | INTRAVENOUS | Status: DC
  Administered 2017-05-10 – 2017-05-12 (×3): 1 g via INTRAVENOUS
  Filled 2017-05-10 (×6): qty 10

## 2017-05-10 MED ORDER — ACETAMINOPHEN 650 MG RE SUPP: 650.0000 mg | Freq: Four times a day (QID) | RECTAL | Status: DC | PRN

## 2017-05-10 MED ORDER — ACETAMINOPHEN 325 MG PO TABS: 650.0000 mg | ORAL_TABLET | Freq: Four times a day (QID) | ORAL | Status: DC | PRN

## 2017-05-10 MED ORDER — IPRATROPIUM-ALBUTEROL 0.5-2.5 (3) MG/3ML IN SOLN
3.0000 mL | Freq: Four times a day (QID) | RESPIRATORY_TRACT | Status: DC
  Administered 2017-05-10 – 2017-05-13 (×10): 3 mL via RESPIRATORY_TRACT
  Filled 2017-05-10 (×9): qty 3

## 2017-05-10 MED ORDER — ATORVASTATIN CALCIUM 40 MG PO TABS
80.0000 mg | ORAL_TABLET | Freq: Every day | ORAL | Status: DC
  Administered 2017-05-10 – 2017-05-12 (×3): 80 mg via ORAL
  Filled 2017-05-10 (×3): qty 2

## 2017-05-10 MED ORDER — ARFORMOTEROL TARTRATE 15 MCG/2ML IN NEBU
15.0000 ug | INHALATION_SOLUTION | Freq: Two times a day (BID) | RESPIRATORY_TRACT | Status: DC
  Administered 2017-05-10 – 2017-05-13 (×6): 15 ug via RESPIRATORY_TRACT
  Filled 2017-05-10 (×6): qty 2

## 2017-05-10 MED ORDER — ONDANSETRON HCL 4 MG PO TABS: 4.0000 mg | ORAL_TABLET | Freq: Four times a day (QID) | ORAL | Status: DC | PRN

## 2017-05-10 MED ORDER — IPRATROPIUM-ALBUTEROL 0.5-2.5 (3) MG/3ML IN SOLN
3.0000 mL | Freq: Once | RESPIRATORY_TRACT | Status: AC
  Administered 2017-05-10: 3 mL via RESPIRATORY_TRACT
  Filled 2017-05-10: qty 3

## 2017-05-10 NOTE — ED Provider Notes (Signed)
[facility] EMERGENCY DEPARTMENT Provider Note   CSN: [id number] Arrival date & time: 05/10/17  1103     History   Chief Complaint Chief Complaint  Patient presents with  . Shortness of Breath    HPI [name] is a 81 y.o. female.  HPI With history of COPD presents with 1 week of increasing shortness of breath, wheezing and cough productive of thick green sputum.  She denies any fevers or chills.  Has been using her nebulizer at home with little improvement.  Was given Solu-Medrol and albuterol neb in route by EMS.  Noted to be 86% on room air.  Patient does not wear home oxygen.  She has had some upper abdominal and chest pain which she states is worse with coughing or deep breathing.  No vomiting or diarrhea.  No new lower extremity swelling or pain. Past Medical History:  Diagnosis Date  . Anxiety   . Cervical disc disorder with myelopathy, unspecified cervical region   . Chronic systolic (congestive) heart failure ([place])   . COPD (chronic obstructive pulmonary disease) ([place])   . Essential hypertension   . Hemorrhoids   . Liver mass   . Lung, cysts, congenital    Left lung cyst  . Myocardial infarction ([place])   . Nephrolithiasis    Embedded  . Nonischemic cardiomyopathy ([place])    LVEF 35-40% 2015  . Osteoarthritis   . Thoracic ascending aortic aneurysm (HCC)    4.3 cm April 2016    Patient Active Problem List   Diagnosis Date Noted  . Dizziness 04/01/2017  . COPD (chronic obstructive pulmonary disease) ([place]) 04/01/2017  . CAD (coronary artery disease) 04/01/2017  . NSTEMI (non-ST elevated myocardial infarction) ([place]) 11/23/2016  . Hypertensive cardiovascular disease 10/03/2015  . Protein-calorie malnutrition, severe 07/13/2015  . Demand ischemia of myocardium ([place]) 07/13/2015  . Nonischemic cardiomyopathy ([place])   . Atypical chest pain   . Left bundle branch block   . COPD exacerbation ([place]) 07/11/2015  . Flu-like symptoms 07/11/2015  . Elevated troponin  07/11/2015  . Acute bronchitis 07/11/2015  . GERD (gastroesophageal reflux disease) 07/11/2015  . Bronchospasm   . Ascending aortic aneurysm ([place]) 01/27/2015  . Chronic combined systolic and diastolic CHF (congestive heart failure) ([place]) 01/27/2015  . Chest pain at rest 01/27/2015  . Essential hypertension 01/27/2015  . Anxiety 01/27/2015  . Chronic pain 01/27/2015  . Chest pain 08/20/2013  . Hypertension 08/20/2013  . Contusion of left knee 08/07/2012  . Sprain of wrist 08/07/2012  . Liver mass 05/21/2011  . Aneurysm ([place]) 05/21/2011  . Abdominal pain 05/21/2011  . Bronchitis 05/21/2011  . De Quervain's disease (tenosynovitis) 04/02/2011  . SHOULDER PAIN 01/19/2009  . CERVICALGIA 01/19/2009  . IMPINGEMENT SYNDROME 01/19/2009    Past Surgical History:  Procedure Laterality Date  . Benign breast tumors    . CHOLECYSTECTOMY    . COLONOSCOPY    . COLONOSCOPY N/A 09/22/2014   Procedure: COLONOSCOPY;  Surgeon: Rogene Houston, MD;  Location: [facility];  Service: Endoscopy;  Laterality: N/A;  830 -- to be done in OR under fluoro  . Complete hysterectomy    . CORONARY STENT INTERVENTION N/A 11/23/2016   Procedure: Coronary Stent Intervention;  Surgeon: Martinique, Peter M, MD;  Location: [place] CV LAB;  Service: Cardiovascular;  Laterality: N/A;  . LEFT HEART CATH AND CORONARY ANGIOGRAPHY N/A 11/23/2016   Procedure: Left Heart Cath and Coronary Angiography;  Surgeon: Martinique, Peter M, MD;  Location: [place]  CV LAB;  Service: Cardiovascular;  Laterality: N/A;  . TONSILLECTOMY AND ADENOIDECTOMY      OB History    No data available       Home Medications    Prior to Admission medications   Medication Sig Start Date End Date Taking? Authorizing Provider  albuterol (PROVENTIL) (2.5 MG/3ML) 0.083% nebulizer solution Take 3 mLs (2.5 mg total) by nebulization every 6 (six) hours as needed for wheezing or shortness of breath. 07/13/15   Sinda Du, MD  aspirin EC 81 MG  tablet Take 81 mg by mouth every morning.     [provider]  atorvastatin (LIPITOR) 80 MG tablet Take 1 tablet (80 mg total) by mouth daily at 6 PM. 11/24/16   Bhagat, Bhavinkumar, PA  carvedilol (COREG) 6.25 MG tablet TAKE ONE TABLET BY MOUTH 2 TIMES A DAY. 04/16/17   Herminio Commons, MD  clorazepate (TRANXENE) 7.5 MG tablet Take 7.5 mg by mouth daily as needed for anxiety. For nerves    [provider]  meclizine (ANTIVERT) 25 MG tablet Take 1 or 2  Every 6 hours 04/03/17   Sinda Du, MD  naproxen (NAPROSYN) 500 MG tablet Take 1 tablet by mouth 2 (two) times daily. 12/11/16   [provider]  nitroGLYCERIN (NITROSTAT) 0.4 MG SL tablet Place 1 tablet (0.4 mg total) under the tongue every 5 (five) minutes x 3 doses as needed for chest pain. 11/24/16   Leanor Kail, PA  prasugrel (EFFIENT) 10 MG TABS tablet Take 1 tablet (10 mg total) daily by mouth. 03/25/17 06/23/17  Herminio Commons, MD  predniSONE (STERAPRED UNI-PAK 21 TAB) 10 MG (21) TBPK tablet Take as directed 04/03/17   Sinda Du, MD  PROAIR HFA 108 (90 BASE) MCG/ACT inhaler Inhale 2 puffs into the lungs every 6 (six) hours as needed for wheezing or shortness of breath.  02/08/14   [provider]  sacubitril-valsartan (ENTRESTO) 24-26 MG Take 1 tablet by mouth 2 (two) times daily. 08/29/16   Herminio Commons, MD  traMADol (ULTRAM) 50 MG tablet Take 50 mg by mouth daily as needed for moderate pain or severe pain. Maximum dose= 8 tablets per day    [provider]    Family History Family History  Problem Relation Age of Onset  . Heart disease Mother   . Aneurysm Father   . Lung cancer Brother   . Heart disease Sister   . Diabetes Brother   . Heart disease Brother     Social History Social History   Tobacco Use  . Smoking status: Former Smoker    Packs/day: 0.25    Years: 31.00    Pack years: 7.75    Types: Cigarettes    Start date: 05/14/1977    Last  attempt to quit: 05/28/2008    Years since quitting: 8.9  . Smokeless tobacco: Never Used  Substance Use Topics  . Alcohol use: No    Alcohol/week: 0.0 oz  . Drug use: No     Allergies   Plavix [clopidogrel bisulfate]; Alprazolam; Codeine; Percodan [oxycodone-aspirin]; and Valium   Review of Systems Review of Systems  Constitutional: Negative for chills and fever.  HENT: Negative for congestion and sore throat.   Respiratory: Positive for cough, shortness of breath and wheezing.   Cardiovascular: Positive for chest pain. Negative for palpitations and leg swelling.  Gastrointestinal: Positive for abdominal pain. Negative for diarrhea, nausea and vomiting.  Genitourinary: Negative for dysuria, flank pain and frequency.  Musculoskeletal:  Negative for back pain, myalgias, neck pain and neck stiffness.  Neurological: Negative for dizziness, weakness, light-headedness, numbness and headaches.  All other systems reviewed and are negative.    Physical Exam Updated Vital Signs BP (!) 125/57   Pulse 75   Temp 98 F (36.7 C) (Oral)   Resp (!) 25   Ht 5\' 2"  (1.575 m)   Wt 76.7 kg (169 lb)   SpO2 99%   BMI 30.91 kg/m   Physical Exam  Constitutional: She is oriented to person, place, and time. She appears well-developed and well-nourished. No distress.  HENT:  Head: Normocephalic and atraumatic.  Mouth/Throat: Oropharynx is clear and moist.  Eyes: EOM are normal. Pupils are equal, round, and reactive to light.  Neck: Normal range of motion. Neck supple.  Cardiovascular: Normal rate and regular rhythm.  Pulmonary/Chest: Effort normal. She exhibits tenderness.  Expiratory wheezing throughout.  Anterior chest wall tenderness to palpation.  No crepitance or deformity.  Abdominal: Soft. Bowel sounds are normal. There is tenderness. There is no rebound and no guarding.  Mild epigastric tenderness to palpation.  No rebound or guarding.  Musculoskeletal: Normal range of motion. She  exhibits no edema or tenderness.  No lower extremity swelling, asymmetry or tenderness.  Distal pulses are 2+.  Neurological: She is alert and oriented to person, place, and time.  Moving all extremities without focal deficit.  Sensation fully intact.  Skin: Skin is warm and dry. Capillary refill takes less than 2 seconds. No rash noted. No erythema.  Psychiatric: She has a normal mood and affect. Her behavior is normal.  Nursing note and vitals reviewed.    ED Treatments / Results  Labs (all labs ordered are listed, but only abnormal results are displayed) Labs Reviewed  COMPREHENSIVE METABOLIC PANEL - Abnormal; Notable for the following components:      Result Value   Glucose, Bld 120 (*)    All other components within normal limits  CBC WITH DIFFERENTIAL/PLATELET  LIPASE, BLOOD  TROPONIN I    EKG  EKG Interpretation  Date/Time:  Friday May 10 2017 11:06:49 EST Ventricular Rate:  79 PR Interval:    QRS Duration: 172 QT Interval:  445 QTC Calculation: 511 R Axis:   9 Text Interpretation:  Sinus rhythm Left bundle branch block Confirmed by Julianne Rice [phone]) on 05/10/2017 11:12:44 AM       Radiology Dg Chest 2 View  Result Date: 05/10/2017 CLINICAL DATA:  SOB and cough x 1 week   Hx HTN, Breast surg, COPD EXAM: CHEST  2 VIEW COMPARISON:  11/22/2016 FINDINGS: Heart size is mildly enlarged. There are no focal consolidations or pleural effusions. No pulmonary edema. Left shoulder arthroplasty. Degenerative changes are seen in the midthoracic spine. IMPRESSION: Stable cardiomegaly.  No evidence for acute  abnormality. Electronically Signed   By: Nolon Nations M.D.   On: 05/10/2017 11:59    Procedures Procedures (including critical care time)  Medications Ordered in ED Medications  albuterol (PROVENTIL,VENTOLIN) solution continuous neb (10 mg/hr Nebulization New Bag/Given 05/10/17 1207)  ipratropium-albuterol (DUONEB) 0.5-2.5 (3) MG/3ML nebulizer solution 3  mL (3 mLs Nebulization Given 05/10/17 1207)     Initial Impression / Assessment and Plan / ED Course  I have reviewed the triage vital signs and the nursing notes.  Pertinent labs & imaging results that were available during my care of the patient were reviewed by me and considered in my medical decision making (see chart for details).    With persistent wheezing  and oxygen requirement despite multiple nebulized treatments.  States she is feeling much better.  Will discuss with hospitalist regarding admission for COPD exacerbation.   Final Clinical Impressions(s) / ED Diagnoses   Final diagnoses:  COPD exacerbation [facility])    ED Discharge Orders    None       Julianne Rice, MD 05/10/17 1438

## 2017-05-10 NOTE — H&P (Signed)
History and Physical    Dulcinea Kinser Bellizzi ZOX:[id number] DOB: Mar 27, 1936 DOA: 05/10/2017  PCP: Sinda Du, MD  Patient coming from: Home  Chief Complaint: "I couldn't breathe"  HPI: [name] is a 81 y.o. female with medical history significant of COPD, CHF (systolic), AAA, that called EMS for difficulty breathing.  Patient reports that she felt very short of breath this am and called EMS and says if they hadn't come when they did she would have been passed out.  She reports even though her oxygen number looks good she is very short of breath.  She says the shortness of breath began when she tried to get up this am.  Patient reports she has been having shortness of breath and lung troubles for quite some time.  No fevers or chills.  Patient has had some significant coughing and attempted to eat honey and use vicks to improve her breathing.  She is coughing up thick yellow green sputum.  Occasional pain that is present in her chest when she coughs.  Of note patient had a left heart cath on 11/23/2016 which showed 2 vessel obstructive coronary artery disease 85% segmental mid LAD, 90% mid OM1, normal LVEDP, with successful stenting of the mid LAD with DES and successful stenting of the mid OM1 with DES.  Echocardiogram which was performed on 11/24/2016 showed an EF of 40-45% with grade 1 diastolic dysfunction.  Patient denies any significant weight gain in the past few days.  Patient denies any significant edema or swelling of the lower extremities in the past few days.  ED Course: Patient was seen and evaluated by emergency room physician Dr. Lita Mains.  She had persistent wheezing and oxygen requirement despite numerous nebulized treatments.  At rest she was sustaining an O2 saturation of 92-93% on 2 L nasal cannula.  EKG showed sinus rhythm with a left bundle branch block.  This was unchanged from previous.  WBC of 8.1.  H&H and platelets were within normal limits.  Electrolyte panel was all within  normal limits.  Chest x-ray showed stable cardiomegaly but no acute abnormality.  TRH was asked to admit patient for COPD exacerbation.   Review of Systems: As per HPI otherwise 10 point review of systems negative.    Past Medical History:  Diagnosis Date  . Anxiety   . Cervical disc disorder with myelopathy, unspecified cervical region   . Chronic systolic (congestive) heart failure ([place])   . COPD (chronic obstructive pulmonary disease) ([place])   . Essential hypertension   . Hemorrhoids   . Liver mass   . Lung, cysts, congenital    Left lung cyst  . Myocardial infarction ([place])   . Nephrolithiasis    Embedded  . Nonischemic cardiomyopathy ([place])    LVEF 35-40% 2015  . Osteoarthritis   . Thoracic ascending aortic aneurysm (H[facility])    4.3 cm April 2016    Past Surgical History:  Procedure Laterality Date  . Benign breast tumors    . CHOLECYSTECTOMY    . COLONOSCOPY    . COLONOSCOPY N/A 09/22/2014   Procedure: COLONOSCOPY;  Surgeon: Rogene Houston, MD;  [facility];  Service: Endoscopy;  Laterality: N/A;  830 -- to be done in OR under fluoro  . Complete hysterectomy    . CORONARY STENT INTERVENTION N/A 11/23/2016   Procedure: Coronary Stent Intervention;  Surgeon: Martinique, Peter M, MD;  Location: [place] [facility];  Service: Cardiovascular;  Laterality: N/A;  . LEFT HEART CATH  AND CORONARY ANGIOGRAPHY N/A 11/23/2016   Procedure: Left Heart Cath and Coronary Angiography;  Surgeon: Martinique, Peter M, MD;  Location: [place] [facility];  Service: Cardiovascular;  Laterality: N/A;  . TONSILLECTOMY AND ADENOIDECTOMY       reports that she quit smoking about 8 years ago. Her smoking use included cigarettes. She started smoking about 40 years ago. She has a 7.75 pack-year smoking history. she has never used smokeless tobacco. She reports that she does not drink alcohol or use drugs.  Allergies  Allergen Reactions  . Plavix [Clopidogrel Bisulfate] Itching    Severe itching  .  Alprazolam Nausea And Vomiting  . Codeine Nausea And Vomiting  . Percodan [Oxycodone-Aspirin] Nausea And Vomiting  . Valium Nausea And Vomiting    Family History  Problem Relation Age of Onset  . Heart disease Mother   . Aneurysm Father   . Lung cancer Brother   . Heart disease Sister   . Diabetes Brother   . Heart disease Brother     Prior to Admission medications   Medication Sig Start Date End Date Taking? Authorizing Provider  albuterol (PROVENTIL) (2.5 MG/3ML) 0.083% nebulizer solution Take 3 mLs (2.5 mg total) by nebulization every 6 (six) hours as needed for wheezing or shortness of breath. 07/13/15   Sinda Du, MD  aspirin EC 81 MG tablet Take 81 mg by mouth every morning.     [provider]  atorvastatin (LIPITOR) 80 MG tablet Take 1 tablet (80 mg total) by mouth daily at 6 PM. 11/24/16   Bhagat, Bhavinkumar, PA  carvedilol (COREG) 6.25 MG tablet TAKE ONE TABLET BY MOUTH 2 TIMES A DAY. 04/16/17   Herminio Commons, MD  clorazepate (TRANXENE) 7.5 MG tablet Take 7.5 mg by mouth daily as needed for anxiety. For nerves    [provider]  meclizine (ANTIVERT) 25 MG tablet Take 1 or 2  Every 6 hours 04/03/17   Sinda Du, MD  naproxen (NAPROSYN) 500 MG tablet Take 1 tablet by mouth 2 (two) times daily. 12/11/16   [provider]  nitroGLYCERIN (NITROSTAT) 0.4 MG SL tablet Place 1 tablet (0.4 mg total) under the tongue every 5 (five) minutes x 3 doses as needed for chest pain. 11/24/16   Leanor Kail, PA  prasugrel (EFFIENT) 10 MG TABS tablet Take 1 tablet (10 mg total) daily by mouth. 03/25/17 06/23/17  Herminio Commons, MD  predniSONE (STERAPRED UNI-PAK 21 TAB) 10 MG (21) TBPK tablet Take as directed 04/03/17   Sinda Du, MD  PROAIR HFA 108 (90 BASE) MCG/ACT inhaler Inhale 2 puffs into the lungs every 6 (six) hours as needed for wheezing or shortness of breath.  02/08/14   [provider]  sacubitril-valsartan (ENTRESTO)  24-26 MG Take 1 tablet by mouth 2 (two) times daily. 08/29/16   Herminio Commons, MD  traMADol (ULTRAM) 50 MG tablet Take 50 mg by mouth daily as needed for moderate pain or severe pain. Maximum dose= 8 tablets per day    [provider]    Physical Exam: Vitals:   05/10/17 1400 05/10/17 1500 05/10/17 1530 05/10/17 1600  BP: (!) 110/52 123/84 (!) 120/58 (!) 104/51  Pulse: 70 78 81 74  Resp: 18 19 (!) 23 (!) 22  Temp:      TempSrc:      SpO2: 94% 93% 94% 95%  Weight:      Height:          Constitutional: Shortness of breath,  speaking in phrases Vitals:   05/10/17 1400 05/10/17 1500 05/10/17 1530 05/10/17 1600  BP: (!) 110/52 123/84 (!) 120/58 (!) 104/51  Pulse: 70 78 81 74  Resp: 18 19 (!) 23 (!) 22  Temp:      TempSrc:      SpO2: 94% 93% 94% 95%  Weight:      Height:       Eyes: PERRL, lids and conjunctivae normal ENMT: Mucous membranes are moist. Posterior pharynx clear of any exudate or lesions.no dentition Neck: normal, supple, no masses, no thyromegaly Respiratory: Increased work of breathing, significant expiratory wheezing appreciated throughout all lung fields Cardiovascular: Regular rate and rhythm, no murmurs / rubs / gallops. No extremity edema. 2+ pedal pulses. No carotid bruits.  Abdomen: no tenderness, no masses palpated. No hepatosplenomegaly. Bowel sounds positive.  Musculoskeletal: no clubbing / cyanosis. No joint deformity upper and lower extremities. Good ROM, no contractures. Normal muscle tone.  Skin: no rashes, lesions, ulcers. No induration Neurologic: CN 2-12 grossly intact but patient is hard of hearing and did not have her hearing aids. Sensation intact, DTR normal. Strength 5/5 in all 4.  Psychiatric: Normal judgment and insight. Alert and oriented x 3. Normal mood.   Labs on Admission: I have personally reviewed following labs and imaging studies  CBC: Recent Labs  Lab 05/10/17 1209  WBC 8.1  NEUTROABS 5.6  HGB 12.8  HCT 41.3    MCV 85.0  PLT 998   Basic Metabolic Panel: Recent Labs  Lab 05/10/17 1209  NA 139  K 4.3  CL 102  CO2 28  GLUCOSE 120*  BUN 14  CREATININE 0.57  CALCIUM 9.6   GFR: Estimated Creatinine Clearance: 54.6 mL/min (by C-G formula based on SCr of 0.57 mg/dL). Liver Function Tests: Recent Labs  Lab 05/10/17 1209  AST 33  ALT 26  ALKPHOS 83  BILITOT 0.8  PROT 6.8  ALBUMIN 3.9   Recent Labs  Lab 05/10/17 1209  LIPASE 20   No results for input(s): AMMONIA in the last 168 hours. Coagulation Profile: No results for input(s): INR, PROTIME in the last 168 hours. Cardiac Enzymes: Recent Labs  Lab 05/10/17 1209  TROPONINI <0.03   BNP (last 3 results) No results for input(s): PROBNP in the last 8760 hours. HbA1C: No results for input(s): HGBA1C in the last 72 hours. CBG: No results for input(s): GLUCAP in the last 168 hours. Lipid Profile: No results for input(s): CHOL, HDL, LDLCALC, TRIG, CHOLHDL, LDLDIRECT in the last 72 hours. Thyroid Function Tests: No results for input(s): TSH, T4TOTAL, FREET4, T3FREE, THYROIDAB in the last 72 hours. Anemia Panel: No results for input(s): VITAMINB12, FOLATE, FERRITIN, TIBC, IRON, RETICCTPCT in the last 72 hours. Urine analysis:    Component Value Date/Time   COLORURINE YELLOW 04/01/2017 1816   APPEARANCEUR CLEAR 04/01/2017 1816   LABSPEC 1.015 04/01/2017 1816   PHURINE 5.5 04/01/2017 1816   GLUCOSEU NEGATIVE 04/01/2017 1816   HGBUR NEGATIVE 04/01/2017 1816   BILIRUBINUR NEGATIVE 04/01/2017 1816   KETONESUR 15 (A) 04/01/2017 1816   PROTEINUR NEGATIVE 04/01/2017 1816   UROBILINOGEN 0.2 08/20/2013 1610   NITRITE NEGATIVE 04/01/2017 1816   LEUKOCYTESUR SMALL (A) 04/01/2017 1816   Sepsis Labs: !!!!!!!!!!!!!!!!!!!!!!!!!!!!!!!!!!!!!!!!!!!! @LABRCNTIP (procalcitonin:4,lacticidven:4) )No results found for this or any previous visit (from the past 240 hour(s)).   Radiological Exams on Admission: Dg Chest 2 View  Result Date:  05/10/2017 CLINICAL DATA:  SOB and cough x 1 week   Hx HTN, Breast surg, COPD EXAM:  CHEST  2 VIEW COMPARISON:  11/22/2016 FINDINGS: Heart size is mildly enlarged. There are no focal consolidations or pleural effusions. No pulmonary edema. Left shoulder arthroplasty. Degenerative changes are seen in the midthoracic spine. IMPRESSION: Stable cardiomegaly.  No evidence for acute  abnormality. Electronically Signed   By: Nolon Nations M.D.   On: 05/10/2017 11:59    EKG: Independently reviewed.  Sinus rhythm with left bundle branch block  Assessment/Plan Principal Problem:   COPD exacerbation (HCC) Active Problems:   Hypertension   Chronic combined systolic and diastolic CHF (congestive heart failure) (HCC)   Anxiety   GERD (gastroesophageal reflux disease)   COPD (chronic obstructive pulmonary disease) (H[facility])     COPD exacerbation - Patient was given IV methylprednisolone, will start p.o. prednisone - Ceftriaxone 1 g every 24 hours - Duo nebs - Albuterol -Titrate oxygen as tolerated to an O2 sat of greater than 92% on room air -COPD order set utilized - Sputum culture sent -Continue Mucinex - Flutter valve   Hypertension -Continue Coreg and Lipitor and Entresto  Chronic combined systolic and diastolic CHF -Monitor for signs of fluid overload -Continue Coreg  Coronary artery disease - Continue Coreg and Lipitor and Entresto and Effient      DVT prophylaxis: Patient on long-term chronic anticoagulation Code Status: Full code Family Communication: No family is bedside Disposition Plan: Likely discharge back to previous home environment when improved Consults called: None Admission status: Inpatient, MedSurg   Loretha Stapler MD [facility] Pager [phone]  If 7PM-7AM, please contact night-coverage [email or web address] Password [facility]  05/10/2017, 4:37 PM

## 2017-05-10 NOTE — ED Triage Notes (Signed)
Pt from home. Called EMS for SOB. 86% on room air at home. Productive cough with green sputum.  125mg  solumedrol and  albuterol tx given en route.

## 2017-05-11 LAB — BASIC METABOLIC PANEL
Anion gap: 9 (ref 5–15)
BUN: 17 mg/dL (ref 6–20)
CO2: 26 mmol/L (ref 22–32)
Calcium: 9.4 mg/dL (ref 8.9–10.3)
Chloride: 103 mmol/L (ref 101–111)
Creatinine, Ser: 0.61 mg/dL (ref 0.44–1.00)
GFR calc Af Amer: >60 mL/min (ref >60)
GFR calc non Af Amer: >60 mL/min (ref >60)
Glucose, Bld: 135 mg/dL — ABNORMAL HIGH (ref 65–99)
Potassium: 3.9 mmol/L (ref 3.5–5.1)
Sodium: 138 mmol/L (ref 135–145)

## 2017-05-11 LAB — CBC
HCT: 36.9 % (ref 36.0–46.0)
Hemoglobin: 11.8 g/dL — ABNORMAL LOW (ref 12.0–15.0)
MCH: 26.9 pg (ref 26.0–34.0)
MCHC: 32 g/dL (ref 30.0–36.0)
MCV: 84.2 fL (ref 78.0–100.0)
Platelets: 333 10*3/uL (ref 150–400)
RBC: 4.38 MIL/uL (ref 3.87–5.11)
RDW: 13.4 % (ref 11.5–15.5)
WBC: 10 10*3/uL (ref 4.0–10.5)

## 2017-05-11 LAB — TROPONIN I
Troponin I: <0.03 ng/mL (ref <0.03)
Troponin I: <0.03 ng/mL (ref <0.03)

## 2017-05-11 MED ORDER — METHYLPREDNISOLONE SODIUM SUCC 40 MG IJ SOLR
40.0000 mg | Freq: Two times a day (BID) | INTRAMUSCULAR | Status: DC
  Administered 2017-05-11 – 2017-05-13 (×5): 40 mg via INTRAVENOUS
  Filled 2017-05-11 (×5): qty 1

## 2017-05-11 MED ORDER — ENSURE ENLIVE PO LIQD
237.0000 mL | Freq: Every day | ORAL | Status: DC
  Administered 2017-05-11 – 2017-05-12 (×2): 237 mL via ORAL

## 2017-05-11 MED ORDER — CLONAZEPAM 0.5 MG PO TABS
0.2500 mg | ORAL_TABLET | Freq: Once | ORAL | Status: AC
  Administered 2017-05-12: 0.25 mg via ORAL
  Filled 2017-05-11: qty 1

## 2017-05-11 NOTE — Progress Notes (Signed)
Initial Nutrition Assessment  DOCUMENTATION CODES:   Obesity unspecified  INTERVENTION:  Provide Ensure Enlive po once daily, each supplement provides 350 kcal and 20 grams of protein.  Encourage adequate PO intake.   NUTRITION DIAGNOSIS:   Increased nutrient needs related to chronic illness(CHF, COPD) as evidenced by estimated needs.  GOAL:   Patient will meet greater than or equal to 90% of their needs  MONITOR:   PO intake, Supplement acceptance, Labs, Weight trends, I & O's, Skin  REASON FOR ASSESSMENT:   Consult Assessment of nutrition requirement/status  ASSESSMENT:   81 y.o. female with medical history significant of COPD, CHF (systolic), AAA, that called EMS for difficulty breathing.    No recent meal completion recorded. RD unable to obtain most recent nutrition history. Pt with no weight loss per weight records. RD to order nutritional supplements to aid in adequate nutrition.   Unable to complete Nutrition-Focused physical exam at this time.   Labs and medications reviewed.   Diet Order:  Diet Heart Room service appropriate? Yes; Fluid consistency: Thin  EDUCATION NEEDS:   Not appropriate for education at this time  Skin:  Skin Assessment: Reviewed RN Assessment  Last BM:  12/28  Height:   Ht Readings from Last 1 Encounters:  05/10/17 5\' 2"  (1.575 m)    Weight:   Wt Readings from Last 1 Encounters:  05/10/17 174 lb 13.2 oz (79.3 kg)    Ideal Body Weight:  50 kg  BMI:  Body mass index is 31.98 kg/m.  Estimated Nutritional Needs:   Kcal:  1600-1800  Protein:  75-85 grams  Fluid:  Per MD    Corrin Parker, MS, RD, LDN Pager # [phone] After hours/ weekend pager # [phone]

## 2017-05-11 NOTE — Evaluation (Signed)
Clinical/Bedside Swallow Evaluation Patient Details  Name: [name] MRN: [id number] Date of Birth: [birth year]  Today's Date: 05/11/2017 Time: SLP Start Time (ACUTE ONLY): 81 SLP Stop Time (ACUTE ONLY): 1117 SLP Time Calculation (min) (ACUTE ONLY): 14 min  Past Medical History:  Past Medical History:  Diagnosis Date  . Anxiety   . Cervical disc disorder with myelopathy, unspecified cervical region   . Chronic systolic (congestive) heart failure ([place])   . COPD (chronic obstructive pulmonary disease) ([place])   . Essential hypertension   . Hemorrhoids   . Liver mass   . Lung, cysts, congenital    Left lung cyst  . Myocardial infarction ([place])   . Nephrolithiasis    Embedded  . Nonischemic cardiomyopathy ([place])    LVEF 35-40% 2015  . Osteoarthritis   . Thoracic ascending aortic aneurysm (H[facility])    4.3 cm April 2016   Past Surgical History:  Past Surgical History:  Procedure Laterality Date  . Benign breast tumors    . CHOLECYSTECTOMY    . COLONOSCOPY    . COLONOSCOPY N/A 09/22/2014   Procedure: COLONOSCOPY;  Surgeon: Rogene Houston, MD;  [facility];  Service: Endoscopy;  Laterality: N/A;  830 -- to be done in OR under fluoro  . Complete hysterectomy    . CORONARY STENT INTERVENTION N/A 11/23/2016   Procedure: Coronary Stent Intervention;  Surgeon: Martinique, Peter M, MD;  Location: [place] [facility];  Service: Cardiovascular;  Laterality: N/A;  . LEFT HEART CATH AND CORONARY ANGIOGRAPHY N/A 11/23/2016   Procedure: Left Heart Cath and Coronary Angiography;  Surgeon: Martinique, Peter M, MD;  Location: [place] [facility];  Service: Cardiovascular;  Laterality: N/A;  . TONSILLECTOMY AND ADENOIDECTOMY     HPI:  [name] is a 81 y.o. female with medical history significant of COPD, CHF (systolic), AAA, that called EMS for difficulty breathing.  Patient reports that she felt very short of breath this am and called EMS and says if they hadn't come when they did she would  have been passed out.  She reports even though her oxygen number looks good she is very short of breath.  She says the shortness of breath began when she tried to get up this am.  Patient reports she has been having shortness of breath and lung troubles for quite some time.  No fevers or chills.  Patient has had some significant coughing and attempted to eat honey and use vicks to improve her breathing.  She is coughing up thick yellow green sputum.  Occasional pain that is present in her chest when she coughs.  Of note patient had a left heart cath on 11/23/2016 which showed 2 vessel obstructive coronary artery disease 85% segmental mid LAD, 90% mid OM1, normal LVEDP, with successful stenting of the mid LAD with DES and successful stenting of the mid OM1 with DES.  Echocardiogram which was performed on 11/24/2016 showed an EF of 40-45% with grade 1 diastolic dysfunction.  Patient denies any significant weight gain in the past few days.  Patient denies any significant edema or swelling of the lower extremities in the past few days.   Assessment / Plan / Recommendation Clinical Impression  Pt was evaluated at bedside seated upright at 90 degrees; she consumed regular textures, puree textures and thin liquids with no overt s/sx noted with any textures/consistencies presented. Note edentulous status and the fact that Pt does not have her dentures here with her, but she consumed  regular textures without difficuly and reports eating without them at home. Pt reported occasional globus sensation although according to Pt this only happens "sometimes"; question esophageal component. Consider GI consult if pt continues to report discomfort. Recommend continue with regular diet and thin liquids; follow universal aspiration precautions. There are no further ST needs at this time. ST to sign off. SLP Visit Diagnosis: Dysphagia, unspecified (R13.10)    Aspiration Risk  Mild aspiration risk    Diet Recommendation  Regular;Thin liquid   Liquid Administration via: Cup;Straw Medication Administration: Whole meds with liquid Supervision: Patient able to self feed Compensations: Minimize environmental distractions;Slow rate;Small sips/bites Postural Changes: Seated upright at 90 degrees;Remain upright for at least 30 minutes after po intake    Other  Recommendations Recommended Consults: Consider GI evaluation Oral Care Recommendations: Oral care BID   Follow up Recommendations          Swallow Study   General Date of Onset: 05/10/17 HPI: [name] is a 81 y.o. female with medical history significant of COPD, CHF (systolic), AAA, that called EMS for difficulty breathing.  Patient reports that she felt very short of breath this am and called EMS and says if they hadn't come when they did she would have been passed out.  She reports even though her oxygen number looks good she is very short of breath.  She says the shortness of breath began when she tried to get up this am.  Patient reports she has been having shortness of breath and lung troubles for quite some time.  No fevers or chills.  Patient has had some significant coughing and attempted to eat honey and use vicks to improve her breathing.  She is coughing up thick yellow green sputum.  Occasional pain that is present in her chest when she coughs.  Of note patient had a left heart cath on 11/23/2016 which showed 2 vessel obstructive coronary artery disease 85% segmental mid LAD, 90% mid OM1, normal LVEDP, with successful stenting of the mid LAD with DES and successful stenting of the mid OM1 with DES.  Echocardiogram which was performed on 11/24/2016 showed an EF of 40-45% with grade 1 diastolic dysfunction.  Patient denies any significant weight gain in the past few days.  Patient denies any significant edema or swelling of the lower extremities in the past few days. Type of Study: Bedside Swallow Evaluation Previous Swallow Assessment: none Diet  Prior to this Study: Regular;Thin liquids Temperature Spikes Noted: No Respiratory Status: Nasal cannula History of Recent Intubation: No Behavior/Cognition: Alert;Cooperative;Pleasant mood Oral Cavity Assessment: Within Functional Limits Oral Care Completed by SLP: Recent completion by staff Oral Cavity - Dentition: Edentulous(dentures not available) Vision: Functional for self-feeding Self-Feeding Abilities: Able to feed self Patient Positioning: Upright in bed Baseline Vocal Quality: Normal Volitional Cough: Strong Volitional Swallow: Able to elicit    Oral/Motor/Sensory Function Overall Oral Motor/Sensory Function: Within functional limits   Ice Chips Ice chips: Within functional limits   Thin Liquid Thin Liquid: Within functional limits    Nectar Thick Nectar Thick Liquid: Not tested   Honey Thick Honey Thick Liquid: Not tested   Puree Puree: Within functional limits   Solid   Amelia H. Roddie Mc, CCC-SLP Speech Language Pathologist    Solid: Within functional limits        Wende Bushy 05/11/2017,11:30 AM

## 2017-05-11 NOTE — Progress Notes (Signed)
Subjective: She was admitted yesterday with COPD exacerbation.  She says she is still very short of breath.  She has no new complaints.  Objective: Vital signs in last 24 hours: Temp:  [97.9 F (36.6 C)-98.7 F (37.1 C)] 98.6 F (37 C) (12/29 0606) Pulse Rate:  [67-91] 70 (12/29 0606) Resp:  [14-25] 16 (12/29 0606) BP: (100-141)/(51-111) 100/64 (12/29 0606) SpO2:  [92 %-99 %] 99 % (12/29 0754) Weight:  [76.7 kg (169 lb)-79.3 kg (174 lb 13.2 oz)] 79.3 kg (174 lb 13.2 oz) (12/28 1736) Weight change:  Last BM Date: 05/10/17  Intake/Output from previous day: 12/28 0701 - 12/29 0700 In: 50 [IV Piggyback:50] Out: -   PHYSICAL EXAM General appearance: alert and Very anxious Resp: rhonchi bilaterally Cardio: regular rate and rhythm, S1, S2 normal, no murmur, click, rub or gallop GI: soft, non-tender; bowel sounds normal; no masses,  no organomegaly Extremities: extremities normal, atraumatic, no cyanosis or edema Skin warm and dry  Lab Results:  Results for orders placed or performed during the hospital encounter of 05/10/17 (from the past 48 hour(s))  CBC with Differential/Platelet     Status: None   Collection Time: 05/10/17 12:09 PM  Result Value Ref Range   WBC 8.1 4.0 - 10.5 K/uL   RBC 4.86 3.87 - 5.11 MIL/uL   Hemoglobin 12.8 12.0 - 15.0 g/dL   HCT 41.3 36.0 - 46.0 %   MCV 85.0 78.0 - 100.0 fL   MCH 26.3 26.0 - 34.0 pg   MCHC 31.0 30.0 - 36.0 g/dL   RDW 13.4 11.5 - 15.5 %   Platelets 310 150 - 400 K/uL   Neutrophils Relative % 69 %   Neutro Abs 5.6 1.7 - 7.7 K/uL   Lymphocytes Relative 18 %   Lymphs Abs 1.4 0.7 - 4.0 K/uL   Monocytes Relative 4 %   Monocytes Absolute 0.4 0.1 - 1.0 K/uL   Eosinophils Relative 9 %   Eosinophils Absolute 0.7 0.0 - 0.7 K/uL   Basophils Relative 0 %   Basophils Absolute 0.0 0.0 - 0.1 K/uL  Comprehensive metabolic panel     Status: Abnormal   Collection Time: 05/10/17 12:09 PM  Result Value Ref Range   Sodium 139 135 - 145 mmol/L    Potassium 4.3 3.5 - 5.1 mmol/L   Chloride 102 101 - 111 mmol/L   CO2 28 22 - 32 mmol/L   Glucose, Bld 120 (H) 65 - 99 mg/dL   BUN 14 6 - 20 mg/dL   Creatinine, Ser 0.57 0.44 - 1.00 mg/dL   Calcium 9.6 8.9 - 10.3 mg/dL   Total Protein 6.8 6.5 - 8.1 g/dL   Albumin 3.9 3.5 - 5.0 g/dL   AST 33 15 - 41 U/L   ALT 26 14 - 54 U/L   Alkaline Phosphatase 83 38 - 126 U/L   Total Bilirubin 0.8 0.3 - 1.2 mg/dL   GFR calc non Af Amer >60 >60 mL/min   GFR calc Af Amer >60 >60 mL/min    Comment: (NOTE) The eGFR has been calculated using the CKD EPI equation. This calculation has not been validated in all clinical situations. eGFR's persistently <60 mL/min signify possible Chronic Kidney Disease.    Anion gap 9 5 - 15  Lipase, blood     Status: None   Collection Time: 05/10/17 12:09 PM  Result Value Ref Range   Lipase 20 11 - 51 U/L  Troponin I     Status: None  Collection Time: 05/10/17 12:09 PM  Result Value Ref Range   Troponin I <0.03 <0.03 ng/mL  Troponin I     Status: None   Collection Time: 05/10/17  6:32 PM  Result Value Ref Range   Troponin I <0.03 <0.03 ng/mL  Respiratory Panel by PCR     Status: Abnormal   Collection Time: 05/10/17  6:36 PM  Result Value Ref Range   Adenovirus NOT DETECTED NOT DETECTED   Coronavirus 229E NOT DETECTED NOT DETECTED   Coronavirus HKU1 NOT DETECTED NOT DETECTED   Coronavirus NL63 NOT DETECTED NOT DETECTED   Coronavirus OC43 NOT DETECTED NOT DETECTED   Metapneumovirus NOT DETECTED NOT DETECTED   Rhinovirus / Enterovirus DETECTED (A) NOT DETECTED   Influenza A NOT DETECTED NOT DETECTED   Influenza B NOT DETECTED NOT DETECTED   Parainfluenza Virus 1 NOT DETECTED NOT DETECTED   Parainfluenza Virus 2 NOT DETECTED NOT DETECTED   Parainfluenza Virus 3 NOT DETECTED NOT DETECTED   Parainfluenza Virus 4 NOT DETECTED NOT DETECTED   Respiratory Syncytial Virus NOT DETECTED NOT DETECTED   Bordetella pertussis NOT DETECTED NOT DETECTED   Chlamydophila  pneumoniae NOT DETECTED NOT DETECTED   Mycoplasma pneumoniae NOT DETECTED NOT DETECTED    Comment: Performed at [facility], [place] [address] [place], [place] [address]  Troponin I     Status: None   Collection Time: 05/11/17 12:20 AM  Result Value Ref Range   Troponin I <0.03 <0.03 ng/mL  Troponin I     Status: None   Collection Time: 05/11/17  6:53 AM  Result Value Ref Range   Troponin I <0.03 <0.03 ng/mL  Basic metabolic panel     Status: Abnormal   Collection Time: 05/11/17  6:53 AM  Result Value Ref Range   Sodium 138 135 - 145 mmol/L   Potassium 3.9 3.5 - 5.1 mmol/L   Chloride 103 101 - 111 mmol/L   CO2 26 22 - 32 mmol/L   Glucose, Bld 135 (H) 65 - 99 mg/dL   BUN 17 6 - 20 mg/dL   Creatinine, Ser 0.61 0.44 - 1.00 mg/dL   Calcium 9.4 8.9 - 10.3 mg/dL   GFR calc non Af Amer >60 >60 mL/min   GFR calc Af Amer >60 >60 mL/min    Comment: (NOTE) The eGFR has been calculated using the CKD EPI equation. This calculation has not been validated in all clinical situations. eGFR's persistently <60 mL/min signify possible Chronic Kidney Disease.    Anion gap 9 5 - 15  CBC     Status: Abnormal   Collection Time: 05/11/17  6:53 AM  Result Value Ref Range   WBC 10.0 4.0 - 10.5 K/uL   RBC 4.38 3.87 - 5.11 MIL/uL   Hemoglobin 11.8 (L) 12.0 - 15.0 g/dL   HCT 36.9 36.0 - 46.0 %   MCV 84.2 78.0 - 100.0 fL   MCH 26.9 26.0 - 34.0 pg   MCHC 32.0 30.0 - 36.0 g/dL   RDW 13.4 11.5 - 15.5 %   Platelets 333 150 - 400 K/uL    ABGS No results for input(s): PHART, PO2ART, TCO2, HCO3 in the last 72 hours.  Invalid input(s): PCO2 CULTURES Recent Results (from the past 240 hour(s))  Respiratory Panel by PCR     Status: Abnormal   Collection Time: 05/10/17  6:36 PM  Result Value Ref Range Status   Adenovirus NOT DETECTED NOT DETECTED Final   Coronavirus 229E NOT DETECTED NOT  DETECTED Final   Coronavirus HKU1 NOT DETECTED NOT DETECTED Final   Coronavirus NL63 NOT DETECTED NOT DETECTED  Final   Coronavirus OC43 NOT DETECTED NOT DETECTED Final   Metapneumovirus NOT DETECTED NOT DETECTED Final   Rhinovirus / Enterovirus DETECTED (A) NOT DETECTED Final   Influenza A NOT DETECTED NOT DETECTED Final   Influenza B NOT DETECTED NOT DETECTED Final   Parainfluenza Virus 1 NOT DETECTED NOT DETECTED Final   Parainfluenza Virus 2 NOT DETECTED NOT DETECTED Final   Parainfluenza Virus 3 NOT DETECTED NOT DETECTED Final   Parainfluenza Virus 4 NOT DETECTED NOT DETECTED Final   Respiratory Syncytial Virus NOT DETECTED NOT DETECTED Final   Bordetella pertussis NOT DETECTED NOT DETECTED Final   Chlamydophila pneumoniae NOT DETECTED NOT DETECTED Final   Mycoplasma pneumoniae NOT DETECTED NOT DETECTED Final    Comment: Performed at [facility], [place] [address], [place], [place] [address]   Studies/Results: Dg Chest 2 View  Result Date: 05/10/2017 CLINICAL DATA:  SOB and cough x 1 week   Hx HTN, Breast surg, COPD EXAM: CHEST  2 VIEW COMPARISON:  11/22/2016 FINDINGS: Heart size is mildly enlarged. There are no focal consolidations or pleural effusions. No pulmonary edema. Left shoulder arthroplasty. Degenerative changes are seen in the midthoracic spine. IMPRESSION: Stable cardiomegaly.  No evidence for acute  abnormality. Electronically Signed   By: Nolon Nations M.D.   On: 05/10/2017 11:59    Medications:  Prior to Admission:  Medications Prior to Admission  Medication Sig Dispense Refill Last Dose  . albuterol (PROVENTIL) (2.5 MG/3ML) 0.083% nebulizer solution Take 3 mLs (2.5 mg total) by nebulization every 6 (six) hours as needed for wheezing or shortness of breath. 75 mL 12 05/09/2017 at Unknown time  . aspirin EC 81 MG tablet Take 81 mg by mouth every morning.    05/10/2017 at 0800  . atorvastatin (LIPITOR) 80 MG tablet Take 1 tablet (80 mg total) by mouth daily at 6 PM. 30 tablet 6 05/09/2017 at Unknown time  . carvedilol (COREG) 6.25 MG tablet TAKE ONE TABLET BY MOUTH 2  TIMES A DAY. 180 tablet 1 05/10/2017 at 0800  . cephALEXin (KEFLEX) 500 MG capsule Take 500 mg by mouth 3 (three) times daily.   05/10/2017 at 0800  . clorazepate (TRANXENE) 7.5 MG tablet Take 7.5 mg by mouth daily as needed for anxiety. For nerves   05/09/2017 at Unknown time  . guaiFENesin (MUCINEX) 600 MG 12 hr tablet Take 600 mg by mouth 2 (two) times daily as needed for to loosen phlegm.   05/09/2017 at Unknown time  . meclizine (ANTIVERT) 25 MG tablet Take 1 or 2  Every 6 hours (Patient taking differently: Take 25-50 mg by mouth 4 (four) times daily as needed for dizziness. Take 1 or 2  Every 6 hours) 100 tablet 3 05/09/2017 at Unknown time  . naproxen (NAPROSYN) 500 MG tablet Take 1 tablet by mouth 2 (two) times daily.   05/10/2017 at 0800  . nitroGLYCERIN (NITROSTAT) 0.4 MG SL tablet Place 1 tablet (0.4 mg total) under the tongue every 5 (five) minutes x 3 doses as needed for chest pain. 25 tablet 12 unknown  . prasugrel (EFFIENT) 10 MG TABS tablet Take 1 tablet (10 mg total) daily by mouth. 90 tablet 3 05/10/2017 at 0800  . PROAIR HFA 108 (90 BASE) MCG/ACT inhaler Inhale 2 puffs into the lungs every 6 (six) hours as needed for wheezing or shortness of breath.  05/10/2017 at 0800  . sacubitril-valsartan (ENTRESTO) 24-26 MG Take 1 tablet by mouth 2 (two) times daily. 60 tablet 6 05/10/2017 at 0800  . traMADol (ULTRAM) 50 MG tablet Take 50 mg by mouth daily as needed for moderate pain or severe pain. Maximum dose= 8 tablets per day   Past Week at Unknown time   Scheduled: . arformoterol  15 mcg Nebulization BID  . atorvastatin  80 mg Oral q1800  . carvedilol  6.25 mg Oral BID WC  . ipratropium-albuterol  3 mL Nebulization QID  . methylPREDNISolone (SOLU-MEDROL) injection  40 mg Intravenous Q12H  . prasugrel  10 mg Oral Daily  . sacubitril-valsartan  1 tablet Oral BID   Continuous: . albuterol 10 mg/hr (05/10/17 1207)  . cefTRIAXone (ROCEPHIN)  IV Stopped (05/10/17 2332)    SOR:[id number] **OR** acetaminophen, bisacodyl, clorazepate, guaiFENesin, nitroGLYCERIN, ondansetron **OR** ondansetron (ZOFRAN) IV, senna-docusate, traMADol  Assesment: She was admitted with COPD exacerbation.  She has chronic heart failure but it does not appear that she is volume overloaded.  She has very little understanding of her problem unfortunately.  She wants to go home on oxygen and I told her we had qualified for that.  She is not ready for discharge. Principal Problem:   COPD exacerbation ([place]) Active Problems:   Hypertension   Chronic combined systolic and diastolic CHF (congestive heart failure) ([facility])   Anxiety   GERD (gastroesophageal reflux disease)   COPD (chronic obstructive pulmonary disease) ([place])    Plan: Put her on IV steroids.  Continue all the other treatments.  She is using incentive spirometry and I will add flutter valve    LOS: 1 day   Nishaan Stanke L 05/11/2017, 8:25 AM

## 2017-05-12 NOTE — Progress Notes (Signed)
Subjective: She says she feels a little bit better.  She has a sore throat.  She has no other new complaints.  She is still short of breath.  She had speech evaluation yesterday and was not felt to have risk of aspiration. Objective: Vital signs in last 24 hours: Temp:  [98.1 F (36.7 C)-98.8 F (37.1 C)] 98.1 F (36.7 C) (12/30 0651) Pulse Rate:  [80-89] 80 (12/30 0651) Resp:  [16-18] 18 (12/30 0651) BP: (125-153)/(52-77) 134/60 (12/30 0651) SpO2:  [93 %-98 %] 98 % (12/30 0756) Weight change:  Last BM Date: 05/10/17  Intake/Output from previous day: No intake/output data recorded.  PHYSICAL EXAM General appearance: alert, cooperative and mild distress Resp: rhonchi bilaterally Cardio: regular rate and rhythm, S1, S2 normal, no murmur, click, rub or gallop GI: soft, non-tender; bowel sounds normal; no masses,  no organomegaly Extremities: extremities normal, atraumatic, no cyanosis or edema Her throat is slightly erythematous.  She is anxious  Lab Results:  Results for orders placed or performed during the hospital encounter of 05/10/17 (from the past 48 hour(s))  CBC with Differential/Platelet     Status: None   Collection Time: 05/10/17 12:09 PM  Result Value Ref Range   WBC 8.1 4.0 - 10.5 K/uL   RBC 4.86 3.87 - 5.11 MIL/uL   Hemoglobin 12.8 12.0 - 15.0 g/dL   HCT 41.3 36.0 - 46.0 %   MCV 85.0 78.0 - 100.0 fL   MCH 26.3 26.0 - 34.0 pg   MCHC 31.0 30.0 - 36.0 g/dL   RDW 13.4 11.5 - 15.5 %   Platelets 310 150 - 400 K/uL   Neutrophils Relative % 69 %   Neutro Abs 5.6 1.7 - 7.7 K/uL   Lymphocytes Relative 18 %   Lymphs Abs 1.4 0.7 - 4.0 K/uL   Monocytes Relative 4 %   Monocytes Absolute 0.4 0.1 - 1.0 K/uL   Eosinophils Relative 9 %   Eosinophils Absolute 0.7 0.0 - 0.7 K/uL   Basophils Relative 0 %   Basophils Absolute 0.0 0.0 - 0.1 K/uL  Comprehensive metabolic panel     Status: Abnormal   Collection Time: 05/10/17 12:09 PM  Result Value Ref Range   Sodium 139 135  - 145 mmol/[name]   Potassium 4.3 3.5 - 5.1 mmol/[name]   Chloride 102 101 - 111 mmol/[name]   CO2 28 22 - 32 mmol/[name]   Glucose, Bld 120 (H) 65 - 99 mg/dL   BUN 14 6 - 20 mg/dL   Creatinine, Ser 0.57 0.44 - 1.00 mg/dL   Calcium 9.6 8.9 - 10.3 mg/dL   Total Protein 6.8 6.5 - 8.1 g/dL   Albumin 3.9 3.5 - 5.0 g/dL   AST 33 15 - 41 U/[name]   ALT 26 14 - 54 U/[name]   Alkaline Phosphatase 83 38 - 126 U/[name]   Total Bilirubin 0.8 0.3 - 1.2 mg/dL   GFR calc non Af Amer >60 >60 mL/min   GFR calc Af Amer >60 >60 mL/min    Comment: (NOTE) The eGFR has been calculated using the CKD EPI equation. This calculation has not been validated in all clinical situations. eGFR's persistently <60 mL/min signify possible Chronic Kidney Disease.    Anion gap 9 5 - 15  Lipase, blood     Status: None   Collection Time: 05/10/17 12:09 PM  Result Value Ref Range   Lipase 20 11 - 51 U/[name]  Troponin I     Status: None   Collection Time:  05/10/17 12:09 PM  Result Value Ref Range   Troponin I <0.03 <0.03 ng/mL  Troponin I     Status: None   Collection Time: 05/10/17  6:32 PM  Result Value Ref Range   Troponin I <0.03 <0.03 ng/mL  Respiratory Panel by PCR     Status: Abnormal   Collection Time: 05/10/17  6:36 PM  Result Value Ref Range   Adenovirus NOT DETECTED NOT DETECTED   Coronavirus 229E NOT DETECTED NOT DETECTED   Coronavirus HKU1 NOT DETECTED NOT DETECTED   Coronavirus NL63 NOT DETECTED NOT DETECTED   Coronavirus OC43 NOT DETECTED NOT DETECTED   Metapneumovirus NOT DETECTED NOT DETECTED   Rhinovirus / Enterovirus DETECTED (A) NOT DETECTED   Influenza A NOT DETECTED NOT DETECTED   Influenza B NOT DETECTED NOT DETECTED   Parainfluenza Virus 1 NOT DETECTED NOT DETECTED   Parainfluenza Virus 2 NOT DETECTED NOT DETECTED   Parainfluenza Virus 3 NOT DETECTED NOT DETECTED   Parainfluenza Virus 4 NOT DETECTED NOT DETECTED   Respiratory Syncytial Virus NOT DETECTED NOT DETECTED   Bordetella pertussis NOT DETECTED NOT DETECTED    Chlamydophila pneumoniae NOT DETECTED NOT DETECTED   Mycoplasma pneumoniae NOT DETECTED NOT DETECTED    Comment: Performed at [facility], [place] [address] [place], [place] [address]  Troponin I     Status: None   Collection Time: 05/11/17 12:20 AM  Result Value Ref Range   Troponin I <0.03 <0.03 ng/mL  Troponin I     Status: None   Collection Time: 05/11/17  6:53 AM  Result Value Ref Range   Troponin I <0.03 <0.03 ng/mL  Basic metabolic panel     Status: Abnormal   Collection Time: 05/11/17  6:53 AM  Result Value Ref Range   Sodium 138 135 - 145 mmol/[name]   Potassium 3.9 3.5 - 5.1 mmol/[name]   Chloride 103 101 - 111 mmol/[name]   CO2 26 22 - 32 mmol/[name]   Glucose, Bld 135 (H) 65 - 99 mg/dL   BUN 17 6 - 20 mg/dL   Creatinine, Ser 0.61 0.44 - 1.00 mg/dL   Calcium 9.4 8.9 - 10.3 mg/dL   GFR calc non Af Amer >60 >60 mL/min   GFR calc Af Amer >60 >60 mL/min    Comment: (NOTE) The eGFR has been calculated using the CKD EPI equation. This calculation has not been validated in all clinical situations. eGFR's persistently <60 mL/min signify possible Chronic Kidney Disease.    Anion gap 9 5 - 15  CBC     Status: Abnormal   Collection Time: 05/11/17  6:53 AM  Result Value Ref Range   WBC 10.0 4.0 - 10.5 K/uL   RBC 4.38 3.87 - 5.11 MIL/uL   Hemoglobin 11.8 ([name]) 12.0 - 15.0 g/dL   HCT 36.9 36.0 - 46.0 %   MCV 84.2 78.0 - 100.0 fL   MCH 26.9 26.0 - 34.0 pg   MCHC 32.0 30.0 - 36.0 g/dL   RDW 13.4 11.5 - 15.5 %   Platelets 333 150 - 400 K/uL    ABGS No results for input(s): PHART, PO2ART, TCO2, HCO3 in the last 72 hours.  Invalid input(s): PCO2 CULTURES Recent Results (from the past 240 hour(s))  Respiratory Panel by PCR     Status: Abnormal   Collection Time: 05/10/17  6:36 PM  Result Value Ref Range Status   Adenovirus NOT DETECTED NOT DETECTED Final   Coronavirus 229E NOT DETECTED NOT DETECTED Final  Coronavirus HKU1 NOT DETECTED NOT DETECTED Final   Coronavirus NL63 NOT DETECTED  NOT DETECTED Final   Coronavirus OC43 NOT DETECTED NOT DETECTED Final   Metapneumovirus NOT DETECTED NOT DETECTED Final   Rhinovirus / Enterovirus DETECTED (A) NOT DETECTED Final   Influenza A NOT DETECTED NOT DETECTED Final   Influenza B NOT DETECTED NOT DETECTED Final   Parainfluenza Virus 1 NOT DETECTED NOT DETECTED Final   Parainfluenza Virus 2 NOT DETECTED NOT DETECTED Final   Parainfluenza Virus 3 NOT DETECTED NOT DETECTED Final   Parainfluenza Virus 4 NOT DETECTED NOT DETECTED Final   Respiratory Syncytial Virus NOT DETECTED NOT DETECTED Final   Bordetella pertussis NOT DETECTED NOT DETECTED Final   Chlamydophila pneumoniae NOT DETECTED NOT DETECTED Final   Mycoplasma pneumoniae NOT DETECTED NOT DETECTED Final    Comment: Performed at [facility], [place] [address] [place], [place] [address]   Studies/Results: Dg Chest 2 View  Result Date: 05/10/2017 CLINICAL DATA:  SOB and cough x 1 week   Hx HTN, Breast surg, COPD EXAM: CHEST  2 VIEW COMPARISON:  11/22/2016 FINDINGS: Heart size is mildly enlarged. There are no focal consolidations or pleural effusions. No pulmonary edema. Left shoulder arthroplasty. Degenerative changes are seen in the midthoracic spine. IMPRESSION: Stable cardiomegaly.  No evidence for acute  abnormality. Electronically Signed   By: Nolon Nations M.D.   On: 05/10/2017 11:59    Medications:  Prior to Admission:  Medications Prior to Admission  Medication Sig Dispense Refill Last Dose  . albuterol (PROVENTIL) (2.5 MG/3ML) 0.083% nebulizer solution Take 3 mLs (2.5 mg total) by nebulization every 6 (six) hours as needed for wheezing or shortness of breath. 75 mL 12 05/09/2017 at Unknown time  . aspirin EC 81 MG tablet Take 81 mg by mouth every morning.    05/10/2017 at 0800  . atorvastatin (LIPITOR) 80 MG tablet Take 1 tablet (80 mg total) by mouth daily at 6 PM. 30 tablet 6 05/09/2017 at Unknown time  . carvedilol (COREG) 6.25 MG tablet TAKE ONE TABLET BY  MOUTH 2 TIMES A DAY. 180 tablet 1 05/10/2017 at 0800  . cephALEXin (KEFLEX) 500 MG capsule Take 500 mg by mouth 3 (three) times daily.   05/10/2017 at 0800  . clorazepate (TRANXENE) 7.5 MG tablet Take 7.5 mg by mouth daily as needed for anxiety. For nerves   05/09/2017 at Unknown time  . guaiFENesin (MUCINEX) 600 MG 12 hr tablet Take 600 mg by mouth 2 (two) times daily as needed for to loosen phlegm.   05/09/2017 at Unknown time  . meclizine (ANTIVERT) 25 MG tablet Take 1 or 2  Every 6 hours (Patient taking differently: Take 25-50 mg by mouth 4 (four) times daily as needed for dizziness. Take 1 or 2  Every 6 hours) 100 tablet 3 05/09/2017 at Unknown time  . naproxen (NAPROSYN) 500 MG tablet Take 1 tablet by mouth 2 (two) times daily.   05/10/2017 at 0800  . nitroGLYCERIN (NITROSTAT) 0.4 MG SL tablet Place 1 tablet (0.4 mg total) under the tongue every 5 (five) minutes x 3 doses as needed for chest pain. 25 tablet 12 unknown  . prasugrel (EFFIENT) 10 MG TABS tablet Take 1 tablet (10 mg total) daily by mouth. 90 tablet 3 05/10/2017 at 0800  . PROAIR HFA 108 (90 BASE) MCG/ACT inhaler Inhale 2 puffs into the lungs every 6 (six) hours as needed for wheezing or shortness of breath.    05/10/2017 at 0800  .  sacubitril-valsartan (ENTRESTO) 24-26 MG Take 1 tablet by mouth 2 (two) times daily. 60 tablet 6 05/10/2017 at 0800  . traMADol (ULTRAM) 50 MG tablet Take 50 mg by mouth daily as needed for moderate pain or severe pain. Maximum dose= 8 tablets per day   Past Week at Unknown time   Scheduled: . arformoterol  15 mcg Nebulization BID  . atorvastatin  80 mg Oral q1800  . carvedilol  6.25 mg Oral BID WC  . feeding supplement (ENSURE ENLIVE)  237 mL Oral Q1500  . ipratropium-albuterol  3 mL Nebulization QID  . methylPREDNISolone (SOLU-MEDROL) injection  40 mg Intravenous Q12H  . prasugrel  10 mg Oral Daily  . sacubitril-valsartan  1 tablet Oral BID   Continuous: . albuterol 10 mg/hr (05/10/17 1207)  .  cefTRIAXone (ROCEPHIN)  IV Stopped (05/11/17 1815)   XOG:[id number] **OR** acetaminophen, bisacodyl, clorazepate, guaiFENesin, nitroGLYCERIN, ondansetron **OR** ondansetron (ZOFRAN) IV, senna-docusate, traMADol  Assesment: She was admitted with COPD exacerbation.  She is improving.  There was concern that she might be aspirating but her swallow evaluation was normal.  She has chronic combined systolic and diastolic heart failure which appears to be stable.  She has hypertension which is pretty well controlled.  She has coronary disease but is not having any chest pain.  She has anxiety which is an ongoing issue. Principal Problem:   COPD exacerbation ([place]) Active Problems:   Hypertension   Chronic combined systolic and diastolic CHF (congestive heart failure) ([facility])   Anxiety   GERD (gastroesophageal reflux disease)   COPD (chronic obstructive pulmonary disease) ([place])    Plan: Continue treatments.    LOS: 2 days   [name] 05/12/2017, 8:11 AM

## 2017-05-13 MED ORDER — PREDNISONE 10 MG (21) PO TBPK: ORAL_TABLET | ORAL | 0 refills | Status: DC

## 2017-05-13 MED ORDER — LEVOFLOXACIN 500 MG PO TABS: 500.0000 mg | ORAL_TABLET | Freq: Every day | ORAL | 0 refills | Status: AC

## 2017-05-13 MED ORDER — IPRATROPIUM-ALBUTEROL 0.5-2.5 (3) MG/3ML IN SOLN: 3.0000 mL | RESPIRATORY_TRACT | Status: DC | PRN

## 2017-05-13 NOTE — Progress Notes (Signed)
Subjective: She says she feels much better and wants to go home.  She is been able to walk in the halls without any difficult she is coughing nonproductively.  She has no other new complaints.  Objective: Vital signs in last 24 hours: Temp:  [97.6 F (36.4 C)-98.7 F (37.1 C)] 97.6 F (36.4 C) (12/31 0536) Pulse Rate:  [73-77] 75 (12/31 0536) Resp:  [18] 18 (12/31 0536) BP: (123-143)/(46-69) 143/69 (12/31 0536) SpO2:  [92 %-96 %] 94 % (12/31 0806) Weight change:  Last BM Date: 05/10/17  Intake/Output from previous day: 12/30 0701 - 12/31 0700 In: 37 [P.O.:50] Out: -   PHYSICAL EXAM General appearance: alert, cooperative and no distress Resp: clear to auscultation bilaterally Cardio: regular rate and rhythm, S1, S2 normal, no murmur, click, rub or gallop GI: soft, non-tender; bowel sounds normal; no masses,  no organomegaly Extremities: extremities normal, atraumatic, no cyanosis or edema Still very anxious  Lab Results:  No results found for this or any previous visit (from the past 48 hour(s)).  ABGS No results for input(s): PHART, PO2ART, TCO2, HCO3 in the last 72 hours.  Invalid input(s): PCO2 CULTURES Recent Results (from the past 240 hour(s))  Respiratory Panel by PCR     Status: Abnormal   Collection Time: 05/10/17  6:36 PM  Result Value Ref Range Status   Adenovirus NOT DETECTED NOT DETECTED Final   Coronavirus 229E NOT DETECTED NOT DETECTED Final   Coronavirus HKU1 NOT DETECTED NOT DETECTED Final   Coronavirus NL63 NOT DETECTED NOT DETECTED Final   Coronavirus OC43 NOT DETECTED NOT DETECTED Final   Metapneumovirus NOT DETECTED NOT DETECTED Final   Rhinovirus / Enterovirus DETECTED (A) NOT DETECTED Final   Influenza A NOT DETECTED NOT DETECTED Final   Influenza B NOT DETECTED NOT DETECTED Final   Parainfluenza Virus 1 NOT DETECTED NOT DETECTED Final   Parainfluenza Virus 2 NOT DETECTED NOT DETECTED Final   Parainfluenza Virus 3 NOT DETECTED NOT DETECTED  Final   Parainfluenza Virus 4 NOT DETECTED NOT DETECTED Final   Respiratory Syncytial Virus NOT DETECTED NOT DETECTED Final   Bordetella pertussis NOT DETECTED NOT DETECTED Final   Chlamydophila pneumoniae NOT DETECTED NOT DETECTED Final   Mycoplasma pneumoniae NOT DETECTED NOT DETECTED Final    Comment: Performed at [facility], [address] [place], [place] [address]   Studies/Results: No results found.  Medications:  Prior to Admission:  Medications Prior to Admission  Medication Sig Dispense Refill Last Dose  . albuterol (PROVENTIL) (2.5 MG/3ML) 0.083% nebulizer solution Take 3 mLs (2.5 mg total) by nebulization every 6 (six) hours as needed for wheezing or shortness of breath. 75 mL 12 05/09/2017 at Unknown time  . aspirin EC 81 MG tablet Take 81 mg by mouth every morning.    05/10/2017 at 0800  . atorvastatin (LIPITOR) 80 MG tablet Take 1 tablet (80 mg total) by mouth daily at 6 PM. 30 tablet 6 05/09/2017 at Unknown time  . carvedilol (COREG) 6.25 MG tablet TAKE ONE TABLET BY MOUTH 2 TIMES A DAY. 180 tablet 1 05/10/2017 at 0800  . cephALEXin (KEFLEX) 500 MG capsule Take 500 mg by mouth 3 (three) times daily.   05/10/2017 at 0800  . clorazepate (TRANXENE) 7.5 MG tablet Take 7.5 mg by mouth daily as needed for anxiety. For nerves   05/09/2017 at Unknown time  . guaiFENesin (MUCINEX) 600 MG 12 hr tablet Take 600 mg by mouth 2 (two) times daily as needed for  to loosen phlegm.   05/09/2017 at Unknown time  . meclizine (ANTIVERT) 25 MG tablet Take 1 or 2  Every 6 hours (Patient taking differently: Take 25-50 mg by mouth 4 (four) times daily as needed for dizziness. Take 1 or 2  Every 6 hours) 100 tablet 3 05/09/2017 at Unknown time  . naproxen (NAPROSYN) 500 MG tablet Take 1 tablet by mouth 2 (two) times daily.   05/10/2017 at 0800  . nitroGLYCERIN (NITROSTAT) 0.4 MG SL tablet Place 1 tablet (0.4 mg total) under the tongue every 5 (five) minutes x 3 doses as needed for chest pain.  25 tablet 12 unknown  . prasugrel (EFFIENT) 10 MG TABS tablet Take 1 tablet (10 mg total) daily by mouth. 90 tablet 3 05/10/2017 at 0800  . PROAIR HFA 108 (90 BASE) MCG/ACT inhaler Inhale 2 puffs into the lungs every 6 (six) hours as needed for wheezing or shortness of breath.    05/10/2017 at 0800  . sacubitril-valsartan (ENTRESTO) 24-26 MG Take 1 tablet by mouth 2 (two) times daily. 60 tablet 6 05/10/2017 at 0800  . traMADol (ULTRAM) 50 MG tablet Take 50 mg by mouth daily as needed for moderate pain or severe pain. Maximum dose= 8 tablets per day   Past Week at Unknown time   Scheduled: . arformoterol  15 mcg Nebulization BID  . atorvastatin  80 mg Oral q1800  . carvedilol  6.25 mg Oral BID WC  . feeding supplement (ENSURE ENLIVE)  237 mL Oral Q1500  . methylPREDNISolone (SOLU-MEDROL) injection  40 mg Intravenous Q12H  . prasugrel  10 mg Oral Daily  . sacubitril-valsartan  1 tablet Oral BID   Continuous: . albuterol 10 mg/hr (05/10/17 1207)  . cefTRIAXone (ROCEPHIN)  IV Stopped (05/12/17 1807)   UTM:[id number] **OR** acetaminophen, bisacodyl, clorazepate, guaiFENesin, ipratropium-albuterol, nitroGLYCERIN, ondansetron **OR** ondansetron (ZOFRAN) IV, senna-docusate, traMADol  Assesment: She was admitted with COPD exacerbation.  She is substantially better.  She wants to know if she is a candidate for oxygen so I am going to have her checked but I do not think she is going to qualify.  She says otherwise she is doing okay.  She has hypertension which is well controlled, chronic combined systolic and diastolic heart failure which is stable, GERD which is stable and anxiety which is still giving her significant issues. Principal Problem:   COPD exacerbation ([place]) Active Problems:   Hypertension   Chronic combined systolic and diastolic CHF (congestive heart failure) (H[facility])   Anxiety   GERD (gastroesophageal reflux disease)   COPD (chronic obstructive pulmonary disease) ([place])    Plan:  Discharge home today    LOS: 3 days   Aaylah Pokorny L 05/13/2017, 8:40 AM

## 2017-05-13 NOTE — Care Management Note (Signed)
Case Management Note  Patient Details  Name: [name] MRN: [id number] Date of Birth: 07/19/1935           From home, admitted with COPD exacerbation, active with [facility]. Pt discharging back home with [facility] services. Order have been entered. Vaughan Basta, [facility] rep, aware of admission and DC plan. Will pull pt info from chart.     Expected Discharge Date:  05/13/17               Expected Discharge Plan:  [place]  In-House Referral:  NA  Discharge planning Services  CM Consult  Post Acute Care Choice:  Home Health, Resumption of Svcs/PTA Provider Choice offered to:  Patient  DME Arranged:    DME Agency:     HH Arranged:  RN Diaz Agency:  [facility]  Status of Service:  Completed, signed off  Sherald Barge, RN 05/13/2017, 11:21 AM

## 2017-05-13 NOTE — Discharge Summary (Signed)
Physician Discharge Summary  Patient ID: [name] MRN: [id number] DOB/AGE: 06/28/1935 81 y.o. Primary Care Physician:Wen Munford, Percell Miller, MD Admit date: 05/10/2017 Discharge date: 05/13/2017    Discharge Diagnoses:   Principal Problem:   COPD exacerbation [facility]) Active Problems:   Hypertension   Chronic combined systolic and diastolic CHF (congestive heart failure) (HCC)   Anxiety   GERD (gastroesophageal reflux disease)   COPD (chronic obstructive pulmonary disease) (HCC)   Allergies as of 05/13/2017      Reactions   Plavix [clopidogrel Bisulfate] Itching   Severe itching   Alprazolam Nausea And Vomiting   Codeine Nausea And Vomiting   Percodan [oxycodone-aspirin] Nausea And Vomiting   Valium Nausea And Vomiting      Medication List    STOP taking these medications   cephALEXin 500 MG capsule Commonly known as:  KEFLEX     TAKE these medications   aspirin EC 81 MG tablet Take 81 mg by mouth every morning.   atorvastatin 80 MG tablet Commonly known as:  LIPITOR Take 1 tablet (80 mg total) by mouth daily at 6 PM.   carvedilol 6.25 MG tablet Commonly known as:  COREG TAKE ONE TABLET BY MOUTH 2 TIMES A DAY.   clorazepate 7.5 MG tablet Commonly known as:  TRANXENE Take 7.5 mg by mouth daily as needed for anxiety. For nerves   levofloxacin 500 MG tablet Commonly known as:  LEVAQUIN Take 1 tablet (500 mg total) by mouth daily for 10 days.   meclizine 25 MG tablet Commonly known as:  ANTIVERT Take 1 or 2  Every 6 hours What changed:    how much to take  how to take this  when to take this  reasons to take this  additional instructions   MUCINEX 600 MG 12 hr tablet Generic drug:  guaiFENesin Take 600 mg by mouth 2 (two) times daily as needed for to loosen phlegm.   naproxen 500 MG tablet Commonly known as:  NAPROSYN Take 1 tablet by mouth 2 (two) times daily.   nitroGLYCERIN 0.4 MG SL tablet Commonly known as:  NITROSTAT Place 1 tablet (0.4  mg total) under the tongue every 5 (five) minutes x 3 doses as needed for chest pain.   prasugrel 10 MG Tabs tablet Commonly known as:  EFFIENT Take 1 tablet (10 mg total) daily by mouth.   predniSONE 10 MG (21) Tbpk tablet Commonly known as:  STERAPRED UNI-PAK 21 TAB Take by package directions   PROAIR HFA 108 (90 Base) MCG/ACT inhaler Generic drug:  albuterol Inhale 2 puffs into the lungs every 6 (six) hours as needed for wheezing or shortness of breath.   albuterol (2.5 MG/3ML) 0.083% nebulizer solution Commonly known as:  PROVENTIL Take 3 mLs (2.5 mg total) by nebulization every 6 (six) hours as needed for wheezing or shortness of breath.   sacubitril-valsartan 24-26 MG Commonly known as:  ENTRESTO Take 1 tablet by mouth 2 (two) times daily.   traMADol 50 MG tablet Commonly known as:  ULTRAM Take 50 mg by mouth daily as needed for moderate pain or severe pain. Maximum dose= 8 tablets per day       Discharged Condition: Improved    Consults: None  Significant Diagnostic Studies: Dg Chest 2 View  Result Date: 05/10/2017 CLINICAL DATA:  SOB and cough x 1 week   Hx HTN, Breast surg, COPD EXAM: CHEST  2 VIEW COMPARISON:  11/22/2016 FINDINGS: Heart size is mildly enlarged. There are no focal consolidations or  pleural effusions. No pulmonary edema. Left shoulder arthroplasty. Degenerative changes are seen in the midthoracic spine. IMPRESSION: Stable cardiomegaly.  No evidence for acute  abnormality. Electronically Signed   By: Nolon Nations M.D.   On: 05/10/2017 11:59    Lab Results: Basic Metabolic Panel: Recent Labs    05/10/17 1209 05/11/17 0653  NA 139 138  K 4.3 3.9  CL 102 103  CO2 28 26  GLUCOSE 120* 135*  BUN 14 17  CREATININE 0.57 0.61  CALCIUM 9.6 9.4   Liver Function Tests: Recent Labs    05/10/17 1209  AST 33  ALT 26  ALKPHOS 83  BILITOT 0.8  PROT 6.8  ALBUMIN 3.9     CBC: Recent Labs    05/10/17 1209 05/11/17 0653  WBC 8.1 10.0   NEUTROABS 5.6  --   HGB 12.8 11.8*  HCT 41.3 36.9  MCV 85.0 84.2  PLT 310 333    Recent Results (from the past 240 hour(s))  Respiratory Panel by PCR     Status: Abnormal   Collection Time: 05/10/17  6:36 PM  Result Value Ref Range Status   Adenovirus NOT DETECTED NOT DETECTED Final   Coronavirus 229E NOT DETECTED NOT DETECTED Final   Coronavirus HKU1 NOT DETECTED NOT DETECTED Final   Coronavirus NL63 NOT DETECTED NOT DETECTED Final   Coronavirus OC43 NOT DETECTED NOT DETECTED Final   Metapneumovirus NOT DETECTED NOT DETECTED Final   Rhinovirus / Enterovirus DETECTED (A) NOT DETECTED Final   Influenza A NOT DETECTED NOT DETECTED Final   Influenza B NOT DETECTED NOT DETECTED Final   Parainfluenza Virus 1 NOT DETECTED NOT DETECTED Final   Parainfluenza Virus 2 NOT DETECTED NOT DETECTED Final   Parainfluenza Virus 3 NOT DETECTED NOT DETECTED Final   Parainfluenza Virus 4 NOT DETECTED NOT DETECTED Final   Respiratory Syncytial Virus NOT DETECTED NOT DETECTED Final   Bordetella pertussis NOT DETECTED NOT DETECTED Final   Chlamydophila pneumoniae NOT DETECTED NOT DETECTED Final   Mycoplasma pneumoniae NOT DETECTED NOT DETECTED Final    Comment: Performed at [facility], [place] [address], [place], [place] [address]     Hospital Course: This is a 81 year old who came to the increasing shortness of breath.  She is known to have COPD and had been treated for COPD exacerbation as an outpatient but failed treatment.  She was started on IV antibiotics and steroids inhaled bronchodilators and improved.  She had significant issues with anxiety.  She wanted to be placed on oxygen but she does not qualify.  By the time of discharge she was back to baseline able to ambulate in the halls without difficulty with oxygen saturation in the low to mid 90 with ambulation.  Discharge Exam: Blood pressure (!) 143/69, pulse 75, temperature 97.6 F (36.4 C), temperature source Oral, resp. rate 18,  height 5\' 2"  (1.575 m), weight 79.3 kg (174 lb 13.2 oz), SpO2 94 %. She is awake and alert.  Her lungs are clear.  Heart is regular.  Disposition: Home with home health services  Discharge Instructions    Diet - low sodium heart healthy   Complete by:  As directed    Face-to-face encounter (required for Medicare/Medicaid patients)   Complete by:  As directed    I Pluma Diniz L certify that this patient is under my care and that I, or a nurse practitioner or physician's assistant working with me, had a face-to-face encounter that meets the physician face-to-face encounter requirements  with this patient on 05/13/2017. The encounter with the patient was in whole, or in part for the following medical condition(s) which is the primary reason for home health care (List medical condition): COPD exacerbation   The encounter with the patient was in whole, or in part, for the following medical condition, which is the primary reason for home health care:  COPD exacerbation   I certify that, based on my findings, the following services are medically necessary home health services:  Nursing   Reason for Medically Necessary Home Health Services:  Skilled Nursing- Change/Decline in Patient Status   My clinical findings support the need for the above services:  Shortness of breath with activity   Further, I certify that my clinical findings support that this patient is homebound due to:  Shortness of Breath with activity   Home Health   Complete by:  As directed    To provide the following care/treatments:  RN   Increase activity slowly   Complete by:  As directed         Signed: Mart Colpitts L   05/13/2017, 8:47 AM

## 2017-05-13 NOTE — Care Management Important Message (Signed)
Important Message  Patient Details  Name: [name] MRN: [id number] Date of Birth: Jan 22, 1936   Medicare Important Message Given:  Yes    Sherald Barge, RN 05/13/2017, 9:23 AM

## 2017-05-13 NOTE — Progress Notes (Signed)
Patient being d/c home with family. IV cath removed and intact. Patient verbalizes understanding of instructions. Patient ambulated in hallway and sats stayed 92-94%. MD aware.

## 2017-05-15 DIAGNOSIS — R42 Dizziness and giddiness: Secondary | ICD-10-CM | POA: Diagnosis not present

## 2017-05-15 DIAGNOSIS — I5042 Chronic combined systolic (congestive) and diastolic (congestive) heart failure: Secondary | ICD-10-CM | POA: Diagnosis not present

## 2017-05-15 DIAGNOSIS — I2510 Atherosclerotic heart disease of native coronary artery without angina pectoris: Secondary | ICD-10-CM | POA: Diagnosis not present

## 2017-05-15 DIAGNOSIS — I110 Hypertensive heart disease with heart failure: Secondary | ICD-10-CM | POA: Diagnosis not present

## 2017-05-15 DIAGNOSIS — J449 Chronic obstructive pulmonary disease, unspecified: Secondary | ICD-10-CM | POA: Diagnosis not present

## 2017-05-15 DIAGNOSIS — I712 Thoracic aortic aneurysm, without rupture: Secondary | ICD-10-CM | POA: Diagnosis not present

## 2017-05-17 DIAGNOSIS — I2510 Atherosclerotic heart disease of native coronary artery without angina pectoris: Secondary | ICD-10-CM | POA: Diagnosis not present

## 2017-05-17 DIAGNOSIS — R42 Dizziness and giddiness: Secondary | ICD-10-CM | POA: Diagnosis not present

## 2017-05-17 DIAGNOSIS — I5042 Chronic combined systolic (congestive) and diastolic (congestive) heart failure: Secondary | ICD-10-CM | POA: Diagnosis not present

## 2017-05-17 DIAGNOSIS — J449 Chronic obstructive pulmonary disease, unspecified: Secondary | ICD-10-CM | POA: Diagnosis not present

## 2017-05-17 DIAGNOSIS — I110 Hypertensive heart disease with heart failure: Secondary | ICD-10-CM | POA: Diagnosis not present

## 2017-05-17 DIAGNOSIS — I712 Thoracic aortic aneurysm, without rupture: Secondary | ICD-10-CM | POA: Diagnosis not present

## 2017-05-20 DIAGNOSIS — I5042 Chronic combined systolic (congestive) and diastolic (congestive) heart failure: Secondary | ICD-10-CM | POA: Diagnosis not present

## 2017-05-20 DIAGNOSIS — I110 Hypertensive heart disease with heart failure: Secondary | ICD-10-CM | POA: Diagnosis not present

## 2017-05-20 DIAGNOSIS — J449 Chronic obstructive pulmonary disease, unspecified: Secondary | ICD-10-CM | POA: Diagnosis not present

## 2017-05-20 DIAGNOSIS — I712 Thoracic aortic aneurysm, without rupture: Secondary | ICD-10-CM | POA: Diagnosis not present

## 2017-05-20 DIAGNOSIS — R42 Dizziness and giddiness: Secondary | ICD-10-CM | POA: Diagnosis not present

## 2017-05-20 DIAGNOSIS — I2510 Atherosclerotic heart disease of native coronary artery without angina pectoris: Secondary | ICD-10-CM | POA: Diagnosis not present

## 2017-05-22 DIAGNOSIS — I110 Hypertensive heart disease with heart failure: Secondary | ICD-10-CM | POA: Diagnosis not present

## 2017-05-22 DIAGNOSIS — R42 Dizziness and giddiness: Secondary | ICD-10-CM | POA: Diagnosis not present

## 2017-05-22 DIAGNOSIS — I712 Thoracic aortic aneurysm, without rupture: Secondary | ICD-10-CM | POA: Diagnosis not present

## 2017-05-22 DIAGNOSIS — I5042 Chronic combined systolic (congestive) and diastolic (congestive) heart failure: Secondary | ICD-10-CM | POA: Diagnosis not present

## 2017-05-22 DIAGNOSIS — I2510 Atherosclerotic heart disease of native coronary artery without angina pectoris: Secondary | ICD-10-CM | POA: Diagnosis not present

## 2017-05-22 DIAGNOSIS — J449 Chronic obstructive pulmonary disease, unspecified: Secondary | ICD-10-CM | POA: Diagnosis not present

## 2017-05-23 DIAGNOSIS — I5042 Chronic combined systolic (congestive) and diastolic (congestive) heart failure: Secondary | ICD-10-CM | POA: Diagnosis not present

## 2017-05-23 DIAGNOSIS — I2510 Atherosclerotic heart disease of native coronary artery without angina pectoris: Secondary | ICD-10-CM | POA: Diagnosis not present

## 2017-05-23 DIAGNOSIS — I712 Thoracic aortic aneurysm, without rupture: Secondary | ICD-10-CM | POA: Diagnosis not present

## 2017-05-23 DIAGNOSIS — R42 Dizziness and giddiness: Secondary | ICD-10-CM | POA: Diagnosis not present

## 2017-05-23 DIAGNOSIS — I110 Hypertensive heart disease with heart failure: Secondary | ICD-10-CM | POA: Diagnosis not present

## 2017-05-23 DIAGNOSIS — J449 Chronic obstructive pulmonary disease, unspecified: Secondary | ICD-10-CM | POA: Diagnosis not present

## 2017-05-29 DIAGNOSIS — I712 Thoracic aortic aneurysm, without rupture: Secondary | ICD-10-CM | POA: Diagnosis not present

## 2017-05-29 DIAGNOSIS — I110 Hypertensive heart disease with heart failure: Secondary | ICD-10-CM | POA: Diagnosis not present

## 2017-05-29 DIAGNOSIS — I2510 Atherosclerotic heart disease of native coronary artery without angina pectoris: Secondary | ICD-10-CM | POA: Diagnosis not present

## 2017-05-29 DIAGNOSIS — R42 Dizziness and giddiness: Secondary | ICD-10-CM | POA: Diagnosis not present

## 2017-05-29 DIAGNOSIS — I5042 Chronic combined systolic (congestive) and diastolic (congestive) heart failure: Secondary | ICD-10-CM | POA: Diagnosis not present

## 2017-05-29 DIAGNOSIS — J449 Chronic obstructive pulmonary disease, unspecified: Secondary | ICD-10-CM | POA: Diagnosis not present

## 2017-06-03 DIAGNOSIS — I2510 Atherosclerotic heart disease of native coronary artery without angina pectoris: Secondary | ICD-10-CM | POA: Diagnosis not present

## 2017-06-03 DIAGNOSIS — I5042 Chronic combined systolic (congestive) and diastolic (congestive) heart failure: Secondary | ICD-10-CM | POA: Diagnosis not present

## 2017-06-03 DIAGNOSIS — I712 Thoracic aortic aneurysm, without rupture: Secondary | ICD-10-CM | POA: Diagnosis not present

## 2017-06-03 DIAGNOSIS — F419 Anxiety disorder, unspecified: Secondary | ICD-10-CM | POA: Diagnosis not present

## 2017-06-03 DIAGNOSIS — G935 Compression of brain: Secondary | ICD-10-CM | POA: Diagnosis not present

## 2017-06-03 DIAGNOSIS — Z7982 Long term (current) use of aspirin: Secondary | ICD-10-CM | POA: Diagnosis not present

## 2017-06-03 DIAGNOSIS — I110 Hypertensive heart disease with heart failure: Secondary | ICD-10-CM | POA: Diagnosis not present

## 2017-06-03 DIAGNOSIS — J441 Chronic obstructive pulmonary disease with (acute) exacerbation: Secondary | ICD-10-CM | POA: Diagnosis not present

## 2017-06-03 DIAGNOSIS — Z955 Presence of coronary angioplasty implant and graft: Secondary | ICD-10-CM | POA: Diagnosis not present

## 2017-06-03 DIAGNOSIS — R42 Dizziness and giddiness: Secondary | ICD-10-CM | POA: Diagnosis not present

## 2017-06-03 DIAGNOSIS — Z87891 Personal history of nicotine dependence: Secondary | ICD-10-CM | POA: Diagnosis not present

## 2017-06-03 DIAGNOSIS — M1991 Primary osteoarthritis, unspecified site: Secondary | ICD-10-CM | POA: Diagnosis not present

## 2017-06-03 DIAGNOSIS — I428 Other cardiomyopathies: Secondary | ICD-10-CM | POA: Diagnosis not present

## 2017-06-03 DIAGNOSIS — K219 Gastro-esophageal reflux disease without esophagitis: Secondary | ICD-10-CM | POA: Diagnosis not present

## 2017-06-03 DIAGNOSIS — Z7902 Long term (current) use of antithrombotics/antiplatelets: Secondary | ICD-10-CM | POA: Diagnosis not present

## 2017-06-03 DIAGNOSIS — M5000 Cervical disc disorder with myelopathy, unspecified cervical region: Secondary | ICD-10-CM | POA: Diagnosis not present

## 2017-06-05 DIAGNOSIS — R42 Dizziness and giddiness: Secondary | ICD-10-CM | POA: Diagnosis not present

## 2017-06-05 DIAGNOSIS — G935 Compression of brain: Secondary | ICD-10-CM | POA: Diagnosis not present

## 2017-06-05 DIAGNOSIS — I10 Essential (primary) hypertension: Secondary | ICD-10-CM | POA: Diagnosis not present

## 2017-06-05 DIAGNOSIS — Z6831 Body mass index (BMI) 31.0-31.9, adult: Secondary | ICD-10-CM | POA: Diagnosis not present

## 2017-06-05 DIAGNOSIS — D329 Benign neoplasm of meninges, unspecified: Secondary | ICD-10-CM | POA: Diagnosis not present

## 2017-06-07 DIAGNOSIS — R42 Dizziness and giddiness: Secondary | ICD-10-CM | POA: Diagnosis not present

## 2017-06-07 DIAGNOSIS — J441 Chronic obstructive pulmonary disease with (acute) exacerbation: Secondary | ICD-10-CM | POA: Diagnosis not present

## 2017-06-07 DIAGNOSIS — I5042 Chronic combined systolic (congestive) and diastolic (congestive) heart failure: Secondary | ICD-10-CM | POA: Diagnosis not present

## 2017-06-07 DIAGNOSIS — I2510 Atherosclerotic heart disease of native coronary artery without angina pectoris: Secondary | ICD-10-CM | POA: Diagnosis not present

## 2017-06-07 DIAGNOSIS — I712 Thoracic aortic aneurysm, without rupture: Secondary | ICD-10-CM | POA: Diagnosis not present

## 2017-06-07 DIAGNOSIS — I110 Hypertensive heart disease with heart failure: Secondary | ICD-10-CM | POA: Diagnosis not present

## 2017-06-10 DIAGNOSIS — I2510 Atherosclerotic heart disease of native coronary artery without angina pectoris: Secondary | ICD-10-CM | POA: Diagnosis not present

## 2017-06-10 DIAGNOSIS — I5022 Chronic systolic (congestive) heart failure: Secondary | ICD-10-CM | POA: Diagnosis not present

## 2017-06-10 DIAGNOSIS — I10 Essential (primary) hypertension: Secondary | ICD-10-CM | POA: Diagnosis not present

## 2017-06-10 DIAGNOSIS — J449 Chronic obstructive pulmonary disease, unspecified: Secondary | ICD-10-CM | POA: Diagnosis not present

## 2017-06-14 DIAGNOSIS — R42 Dizziness and giddiness: Secondary | ICD-10-CM | POA: Diagnosis not present

## 2017-06-14 DIAGNOSIS — I712 Thoracic aortic aneurysm, without rupture: Secondary | ICD-10-CM | POA: Diagnosis not present

## 2017-06-14 DIAGNOSIS — I5042 Chronic combined systolic (congestive) and diastolic (congestive) heart failure: Secondary | ICD-10-CM | POA: Diagnosis not present

## 2017-06-14 DIAGNOSIS — I2510 Atherosclerotic heart disease of native coronary artery without angina pectoris: Secondary | ICD-10-CM | POA: Diagnosis not present

## 2017-06-14 DIAGNOSIS — J441 Chronic obstructive pulmonary disease with (acute) exacerbation: Secondary | ICD-10-CM | POA: Diagnosis not present

## 2017-06-14 DIAGNOSIS — I110 Hypertensive heart disease with heart failure: Secondary | ICD-10-CM | POA: Diagnosis not present

## 2017-06-20 DIAGNOSIS — I110 Hypertensive heart disease with heart failure: Secondary | ICD-10-CM | POA: Diagnosis not present

## 2017-06-20 DIAGNOSIS — I2510 Atherosclerotic heart disease of native coronary artery without angina pectoris: Secondary | ICD-10-CM | POA: Diagnosis not present

## 2017-06-20 DIAGNOSIS — J441 Chronic obstructive pulmonary disease with (acute) exacerbation: Secondary | ICD-10-CM | POA: Diagnosis not present

## 2017-06-20 DIAGNOSIS — R42 Dizziness and giddiness: Secondary | ICD-10-CM | POA: Diagnosis not present

## 2017-06-20 DIAGNOSIS — I712 Thoracic aortic aneurysm, without rupture: Secondary | ICD-10-CM | POA: Diagnosis not present

## 2017-06-20 DIAGNOSIS — I5042 Chronic combined systolic (congestive) and diastolic (congestive) heart failure: Secondary | ICD-10-CM | POA: Diagnosis not present

## 2017-06-27 DIAGNOSIS — R42 Dizziness and giddiness: Secondary | ICD-10-CM | POA: Diagnosis not present

## 2017-06-27 DIAGNOSIS — J441 Chronic obstructive pulmonary disease with (acute) exacerbation: Secondary | ICD-10-CM | POA: Diagnosis not present

## 2017-06-27 DIAGNOSIS — I110 Hypertensive heart disease with heart failure: Secondary | ICD-10-CM | POA: Diagnosis not present

## 2017-06-27 DIAGNOSIS — I2510 Atherosclerotic heart disease of native coronary artery without angina pectoris: Secondary | ICD-10-CM | POA: Diagnosis not present

## 2017-06-27 DIAGNOSIS — I712 Thoracic aortic aneurysm, without rupture: Secondary | ICD-10-CM | POA: Diagnosis not present

## 2017-06-27 DIAGNOSIS — I5042 Chronic combined systolic (congestive) and diastolic (congestive) heart failure: Secondary | ICD-10-CM | POA: Diagnosis not present

## 2017-07-20 ENCOUNTER — Other Ambulatory Visit: Payer: Self-pay

## 2017-07-20 ENCOUNTER — Encounter (HOSPITAL_COMMUNITY): Payer: Self-pay | Admitting: Emergency Medicine

## 2017-07-20 ENCOUNTER — Emergency Department (HOSPITAL_COMMUNITY)
Admission: EM | Admit: 2017-07-20 | Discharge: 2017-07-20 | Disposition: A | Discharge status: Home / Self Care | Payer: Medicare Other | Attending: Emergency Medicine | Admitting: Emergency Medicine

## 2017-07-20 ENCOUNTER — Emergency Department (HOSPITAL_COMMUNITY): Payer: Medicare Other

## 2017-07-20 DIAGNOSIS — I428 Other cardiomyopathies: Secondary | ICD-10-CM | POA: Diagnosis not present

## 2017-07-20 DIAGNOSIS — I252 Old myocardial infarction: Secondary | ICD-10-CM | POA: Diagnosis not present

## 2017-07-20 DIAGNOSIS — I2510 Atherosclerotic heart disease of native coronary artery without angina pectoris: Secondary | ICD-10-CM | POA: Insufficient documentation

## 2017-07-20 DIAGNOSIS — I5022 Chronic systolic (congestive) heart failure: Secondary | ICD-10-CM | POA: Diagnosis not present

## 2017-07-20 DIAGNOSIS — Z7982 Long term (current) use of aspirin: Secondary | ICD-10-CM | POA: Diagnosis not present

## 2017-07-20 DIAGNOSIS — R0602 Shortness of breath: Secondary | ICD-10-CM | POA: Diagnosis not present

## 2017-07-20 DIAGNOSIS — Z87891 Personal history of nicotine dependence: Secondary | ICD-10-CM | POA: Diagnosis not present

## 2017-07-20 DIAGNOSIS — Z79899 Other long term (current) drug therapy: Secondary | ICD-10-CM | POA: Diagnosis not present

## 2017-07-20 DIAGNOSIS — J441 Chronic obstructive pulmonary disease with (acute) exacerbation: Secondary | ICD-10-CM | POA: Diagnosis not present

## 2017-07-20 DIAGNOSIS — I110 Hypertensive heart disease with heart failure: Secondary | ICD-10-CM | POA: Insufficient documentation

## 2017-07-20 LAB — BASIC METABOLIC PANEL
Anion gap: 9 (ref 5–15)
BUN: 12 mg/dL (ref 6–20)
CO2: 28 mmol/L (ref 22–32)
Calcium: 9.6 mg/dL (ref 8.9–10.3)
Chloride: 104 mmol/L (ref 101–111)
Creatinine, Ser: 0.65 mg/dL (ref 0.44–1.00)
GFR calc Af Amer: >60 mL/min (ref >60)
GFR calc non Af Amer: >60 mL/min (ref >60)
Glucose, Bld: 106 mg/dL — ABNORMAL HIGH (ref 65–99)
Potassium: 3.7 mmol/L (ref 3.5–5.1)
Sodium: 141 mmol/L (ref 135–145)

## 2017-07-20 LAB — CBC
HCT: 42.3 % (ref 36.0–46.0)
Hemoglobin: 13 g/dL (ref 12.0–15.0)
MCH: 25.8 pg — ABNORMAL LOW (ref 26.0–34.0)
MCHC: 30.7 g/dL (ref 30.0–36.0)
MCV: 83.9 fL (ref 78.0–100.0)
Platelets: 266 10*3/uL (ref 150–400)
RBC: 5.04 MIL/uL (ref 3.87–5.11)
RDW: 14.4 % (ref 11.5–15.5)
WBC: 6.6 10*3/uL (ref 4.0–10.5)

## 2017-07-20 LAB — BRAIN NATRIURETIC PEPTIDE: B Natriuretic Peptide: 297 pg/mL — ABNORMAL HIGH (ref 0.0–100.0)

## 2017-07-20 LAB — TROPONIN I: Troponin I: <0.03 ng/mL (ref <0.03)

## 2017-07-20 MED ORDER — ALBUTEROL SULFATE (2.5 MG/3ML) 0.083% IN NEBU
5.0000 mg | INHALATION_SOLUTION | Freq: Once | RESPIRATORY_TRACT | Status: AC
  Administered 2017-07-20: 5 mg via RESPIRATORY_TRACT
  Filled 2017-07-20: qty 6

## 2017-07-20 MED ORDER — PREDNISONE 20 MG PO TABS
40.0000 mg | ORAL_TABLET | Freq: Once | ORAL | Status: AC
  Administered 2017-07-20: 40 mg via ORAL
  Filled 2017-07-20: qty 2

## 2017-07-20 MED ORDER — ASPIRIN 81 MG PO CHEW
324.0000 mg | CHEWABLE_TABLET | Freq: Once | ORAL | Status: AC
  Administered 2017-07-20: 324 mg via ORAL
  Filled 2017-07-20: qty 4

## 2017-07-20 MED ORDER — PREDNISONE 20 MG PO TABS: 40.0000 mg | ORAL_TABLET | Freq: Every day | ORAL | 0 refills | Status: DC

## 2017-07-20 MED ORDER — AZITHROMYCIN 250 MG PO TABS
500.0000 mg | ORAL_TABLET | Freq: Once | ORAL | Status: AC
  Administered 2017-07-20: 500 mg via ORAL
  Filled 2017-07-20: qty 2

## 2017-07-20 MED ORDER — AZITHROMYCIN 250 MG PO TABS: 250.0000 mg | ORAL_TABLET | Freq: Every day | ORAL | 0 refills | Status: DC

## 2017-07-20 NOTE — ED Notes (Signed)
Patient ambulated around nurses station o2 maintained at 97

## 2017-07-20 NOTE — ED Triage Notes (Signed)
Patient complains of shortness of breath that started yesterday. States history of COPD.

## 2017-07-20 NOTE — Discharge Instructions (Signed)
Please take prednisone daily for 5 days Zithromax daily for 5 days Albuterol every 4 hours as needed for wheezing, coughing or shortness of breath ER for worsening symtpoms including increased Chest Pain, difficulty breathing or worsening symptoms Rest at home for 48 hours See your doctor on Monday for recheck   Please obtain all of your results from medical records or have your doctors office obtain the results - share them with your doctor - you should be seen at your doctors office in the next 2 days. Call today to arrange your follow up. Take the medications as prescribed. Please review all of the medicines and only take them if you do not have an allergy to them. Please be aware that if you are taking birth control pills, taking other prescriptions, ESPECIALLY ANTIBIOTICS may make the birth control ineffective - if this is the case, either do not engage in sexual activity or use alternative methods of birth control such as condoms until you have finished the medicine and your family doctor says it is OK to restart them. If you are on a blood thinner such as COUMADIN, be aware that any other medicine that you take may cause the coumadin to either work too much, or not enough - you should have your coumadin level rechecked in next 7 days if this is the case.  ?  It is also a possibility that you have an allergic reaction to any of the medicines that you have been prescribed - Everybody reacts differently to medications and while MOST people have no trouble with most medicines, you may have a reaction such as nausea, vomiting, rash, swelling, shortness of breath. If this is the case, please stop taking the medicine immediately and contact your physician.  ?  You should return to the ER if you develop severe or worsening symptoms.

## 2017-07-20 NOTE — ED Notes (Signed)
Respiratory called for neb tx 

## 2017-07-20 NOTE — ED Provider Notes (Signed)
[facility] EMERGENCY DEPARTMENT Provider Note   CSN: [id number] Arrival date & time: 07/20/17  1650     History   Chief Complaint Chief Complaint  Patient presents with  . Shortness of Breath    HPI [name] is a 82 y.o. female.  HPI  -year-old female, she has a known history of a nonischemic cardiomyopathy as well as a myocardial infarction within the last year, she has 2 stents in her heart, she has known COPD and is followed by Dr. at [place], she reports that she has been using increased amounts of treatments with her nebulizer but is not having any relief of her shortness of breath.  She has had generalized weakness, progressive fatigue, chest heaviness and shortness of breath, not any better with nebulizer today.  Denies any swelling of the legs, denies fevers, she is coughing occasionally as well.  She drove herself to the hospital because she did not want to wait for the paramedic transport.  The patient lives by herself independently and usually does very good.  Past Medical History:  Diagnosis Date  . Anxiety   . Cervical disc disorder with myelopathy, unspecified cervical region   . Chronic systolic (congestive) heart failure ([place])   . COPD (chronic obstructive pulmonary disease) ([place])   . Essential hypertension   . Hemorrhoids   . Liver mass   . Lung, cysts, congenital    Left lung cyst  . Myocardial infarction ([place])   . Nephrolithiasis    Embedded  . Nonischemic cardiomyopathy ([place])    LVEF 35-40% 2015  . Osteoarthritis   . Thoracic ascending aortic aneurysm ([facility])    4.3 cm April 2016    Patient Active Problem List   Diagnosis Date Noted  . Dizziness 04/01/2017  . COPD (chronic obstructive pulmonary disease) ([place]) 04/01/2017  . CAD (coronary artery disease) 04/01/2017  . NSTEMI (non-ST elevated myocardial infarction) ([place]) 11/23/2016  . Hypertensive cardiovascular disease 10/03/2015  . Protein-calorie malnutrition, severe 07/13/2015  . Demand  ischemia of myocardium ([place]) 07/13/2015  . Nonischemic cardiomyopathy ([place])   . Atypical chest pain   . Left bundle branch block   . COPD exacerbation ([place]) 07/11/2015  . Flu-like symptoms 07/11/2015  . Elevated troponin 07/11/2015  . Acute bronchitis 07/11/2015  . GERD (gastroesophageal reflux disease) 07/11/2015  . Bronchospasm   . Ascending aortic aneurysm ([place]) 01/27/2015  . Chronic combined systolic and diastolic CHF (congestive heart failure) ([place]) 01/27/2015  . Chest pain at rest 01/27/2015  . Essential hypertension 01/27/2015  . Anxiety 01/27/2015  . Chronic pain 01/27/2015  . Chest pain 08/20/2013  . Hypertension 08/20/2013  . Contusion of left knee 08/07/2012  . Sprain of wrist 08/07/2012  . Liver mass 05/21/2011  . Aneurysm ([place]) 05/21/2011  . Abdominal pain 05/21/2011  . Bronchitis 05/21/2011  . De Quervain's disease (tenosynovitis) 04/02/2011  . SHOULDER PAIN 01/19/2009  . CERVICALGIA 01/19/2009  . IMPINGEMENT SYNDROME 01/19/2009    Past Surgical History:  Procedure Laterality Date  . Benign breast tumors    . CHOLECYSTECTOMY    . COLONOSCOPY    . COLONOSCOPY N/A 09/22/2014   Procedure: COLONOSCOPY;  Surgeon: Rogene Houston, MD;  [facility];  Service: Endoscopy;  Laterality: N/A;  830 -- to be done in OR under fluoro  . Complete hysterectomy    . CORONARY STENT INTERVENTION N/A 11/23/2016   Procedure: Coronary Stent Intervention;  Surgeon: Martinique, Peter M, MD;  Location: [place] CV LAB;  Service:  Cardiovascular;  Laterality: N/A;  . LEFT HEART CATH AND CORONARY ANGIOGRAPHY N/A 11/23/2016   Procedure: Left Heart Cath and Coronary Angiography;  Surgeon: Martinique, Peter M, MD;  Location: [facility] CV LAB;  Service: Cardiovascular;  Laterality: N/A;  . TONSILLECTOMY AND ADENOIDECTOMY      OB History    No data available       Home Medications    Prior to Admission medications   Medication Sig Start Date End Date Taking? Authorizing  Provider  albuterol (PROVENTIL) (2.5 MG/3ML) 0.083% nebulizer solution Take 3 mLs (2.5 mg total) by nebulization every 6 (six) hours as needed for wheezing or shortness of breath. 07/13/15  Yes Sinda Du, MD  aspirin EC 81 MG tablet Take 81 mg by mouth every morning.    Yes [provider]  atorvastatin (LIPITOR) 80 MG tablet Take 1 tablet (80 mg total) by mouth daily at 6 PM. 11/24/16  Yes Bhagat, Bhavinkumar, PA  carvedilol (COREG) 6.25 MG tablet TAKE ONE TABLET BY MOUTH 2 TIMES A DAY. 04/16/17  Yes Herminio Commons, MD  clorazepate (TRANXENE) 7.5 MG tablet Take 7.5 mg by mouth daily as needed for anxiety. For nerves   Yes [provider]  meclizine (ANTIVERT) 25 MG tablet Take 1 or 2  Every 6 hours Patient taking differently: Take 12.5-25 mg by mouth 4 (four) times daily as needed for dizziness.  04/03/17  Yes Sinda Du, MD  nitroGLYCERIN (NITROSTAT) 0.4 MG SL tablet Place 1 tablet (0.4 mg total) under the tongue every 5 (five) minutes x 3 doses as needed for chest pain. 11/24/16  Yes Bhagat, Bhavinkumar, PA  prasugrel (EFFIENT) 10 MG TABS tablet Take 10 mg by mouth daily.   Yes [provider]  PROAIR HFA 108 (90 BASE) MCG/ACT inhaler Inhale 2 puffs into the lungs every 6 (six) hours as needed for wheezing or shortness of breath.  02/08/14  Yes [provider]  sacubitril-valsartan (ENTRESTO) 24-26 MG Take 1 tablet by mouth 2 (two) times daily. 08/29/16  Yes Herminio Commons, MD  traMADol (ULTRAM) 50 MG tablet Take 50 mg by mouth daily as needed for moderate pain or severe pain. Maximum dose= 8 tablets per day   Yes [provider]  azithromycin (ZITHROMAX Z-PAK) 250 MG tablet Take 1 tablet (250 mg total) by mouth daily. 500mg  PO day 1, then 250mg  PO days 205 07/20/17   Noemi Chapel, MD  predniSONE (DELTASONE) 20 MG tablet Take 2 tablets (40 mg total) by mouth daily. 07/20/17   Noemi Chapel, MD    Family History Family History  Problem  Relation Age of Onset  . Heart disease Mother   . Aneurysm Father   . Lung cancer Brother   . Heart disease Sister   . Diabetes Brother   . Heart disease Brother     Social History Social History   Tobacco Use  . Smoking status: Former Smoker    Packs/day: 0.25    Years: 31.00    Pack years: 7.75    Types: Cigarettes    Start date: 05/14/1977    Last attempt to quit: 05/28/2008    Years since quitting: 9.1  . Smokeless tobacco: Never Used  Substance Use Topics  . Alcohol use: No    Alcohol/week: 0.0 oz  . Drug use: No     Allergies   Plavix [clopidogrel bisulfate]; Alprazolam; Codeine; Percodan [oxycodone-aspirin]; and Valium   Review of Systems Review of Systems  All other systems reviewed  and are negative.    Physical Exam Updated Vital Signs BP (!) 163/86 (BP Location: Left Arm)   Pulse 78   Temp 98.2 F (36.8 C) (Oral)   Resp 20   Ht 5\' 2"  (1.575 m)   Wt 78.9 kg (174 lb)   SpO2 97%   BMI 31.83 kg/m   Physical Exam  Constitutional: She appears well-developed and well-nourished. No distress.  HENT:  Head: Normocephalic and atraumatic.  Mouth/Throat: Oropharynx is clear and moist. No oropharyngeal exudate.  Eyes: Conjunctivae and EOM are normal. Pupils are equal, round, and reactive to light. Right eye exhibits no discharge. Left eye exhibits no discharge. No scleral icterus.  Neck: Normal range of motion. Neck supple. No JVD present. No thyromegaly present.  Cardiovascular: Normal rate, regular rhythm, normal heart sounds and intact distal pulses. Exam reveals no gallop and no friction rub.  No murmur heard. Pulmonary/Chest: Effort normal. No respiratory distress. She has wheezes. She has no rales.  The patient speaks in full sentences, she does have a prolonged expiratory phase with expiratory wheezing, no rales, no respiratory distress, no accessory muscle use  Abdominal: Soft. Bowel sounds are normal. She exhibits no distension and no mass. There is no  tenderness.  Musculoskeletal: Normal range of motion. She exhibits no edema or tenderness.  Lymphadenopathy:    She has no cervical adenopathy.  Neurological: She is alert. Coordination normal.  Skin: Skin is warm and dry. No rash noted. No erythema.  Psychiatric: She has a normal mood and affect. Her behavior is normal.  Nursing note and vitals reviewed.    ED Treatments / Results  Labs (all labs ordered are listed, but only abnormal results are displayed) Labs Reviewed  BASIC METABOLIC PANEL - Abnormal; Notable for the following components:      Result Value   Glucose, Bld 106 (*)    All other components within normal limits  CBC - Abnormal; Notable for the following components:   MCH 25.8 (*)    All other components within normal limits  BRAIN NATRIURETIC PEPTIDE - Abnormal; Notable for the following components:   B Natriuretic Peptide 297.0 (*)    All other components within normal limits  TROPONIN I    EKG  EKG Interpretation  Date/Time:  Saturday July 20 2017 17:33:01 EST Ventricular Rate:  71 PR Interval:    QRS Duration: 172 QT Interval:  448 QTC Calculation: 487 R Axis:   39 Text Interpretation:  Sinus rhythm Left bundle branch block since last tracing no significant change Confirmed by Noemi Chapel [phone]) on 07/20/2017 5:49:26 PM       Radiology Dg Chest 2 View  Result Date: 07/20/2017 CLINICAL DATA:  Shortness of Breath EXAM: CHEST - 2 VIEW COMPARISON:  05/10/2017 FINDINGS: Cardiac shadow is mildly enlarged but stable. No infiltrate or sizable effusion is seen. Mild hyperinflation is noted. Postsurgical changes in left shoulder are seen. IMPRESSION: COPD without acute abnormality. Electronically Signed   By: Inez Catalina M.D.   On: 07/20/2017 19:28    Procedures Procedures (including critical care time)  Medications Ordered in ED Medications  predniSONE (DELTASONE) tablet 40 mg (not administered)  azithromycin (ZITHROMAX) tablet 500 mg (not  administered)  albuterol (PROVENTIL) (2.5 MG/3ML) 0.083% nebulizer solution 5 mg (5 mg Nebulization Given 07/20/17 1748)  aspirin chewable tablet 324 mg (324 mg Oral Given 07/20/17 1856)     Initial Impression / Assessment and Plan / ED Course  I have reviewed the triage vital signs and  the nursing notes.  Pertinent labs & imaging results that were available during my care of the patient were reviewed by me and considered in my medical decision making (see chart for details).    Patient does have wheezing, this raises concern for either COPD or congestive heart failure as a possible cause, her generalized weakness, chest tightness and shortness of breath could be either of these 2 scenarios.  I do not think pulmonary embolism is high on the differential given the lack of tachycardia or hypoxia.  Chest x-ray, labs ordered, EKG ordered as well.  CXR neg Labs withotu luekocytosis Trop neg BNP < 300 BMP normal ECG without acute findings Pt improved with meds and on ambulation has no desaturation - wheezing is improved. States she is comfortable going home on Prednisone, Z pak and albuterol which she has plenty ofl.   Dr. Luan Pulling on [facility] Pt agreeable.  Final Clinical Impressions(s) / ED Diagnoses   Final diagnoses:  COPD exacerbation [facility])    ED Discharge Orders        Ordered    azithromycin (ZITHROMAX Z-PAK) 250 MG tablet  Daily     07/20/17 2137    predniSONE (DELTASONE) 20 MG tablet  Daily     07/20/17 2137       Noemi Chapel, MD 07/20/17 2140

## 2017-07-22 ENCOUNTER — Other Ambulatory Visit (HOSPITAL_COMMUNITY): Payer: Self-pay | Admitting: Pulmonary Disease

## 2017-07-22 DIAGNOSIS — E041 Nontoxic single thyroid nodule: Secondary | ICD-10-CM

## 2017-07-22 DIAGNOSIS — I5022 Chronic systolic (congestive) heart failure: Secondary | ICD-10-CM | POA: Diagnosis not present

## 2017-07-22 DIAGNOSIS — M5000 Cervical disc disorder with myelopathy, unspecified cervical region: Secondary | ICD-10-CM | POA: Diagnosis not present

## 2017-07-22 DIAGNOSIS — J449 Chronic obstructive pulmonary disease, unspecified: Secondary | ICD-10-CM | POA: Diagnosis not present

## 2017-07-22 DIAGNOSIS — I10 Essential (primary) hypertension: Secondary | ICD-10-CM | POA: Diagnosis not present

## 2017-07-24 ENCOUNTER — Ambulatory Visit (HOSPITAL_COMMUNITY)
Admission: RE | Admit: 2017-07-24 | Discharge: 2017-07-24 | Disposition: A | Discharge status: Home / Self Care | Payer: Medicare Other | Source: Ambulatory Visit | Attending: Pulmonary Disease | Admitting: Pulmonary Disease

## 2017-07-24 DIAGNOSIS — E041 Nontoxic single thyroid nodule: Secondary | ICD-10-CM

## 2017-07-24 DIAGNOSIS — E042 Nontoxic multinodular goiter: Secondary | ICD-10-CM | POA: Diagnosis not present

## 2017-08-13 ENCOUNTER — Ambulatory Visit (INDEPENDENT_AMBULATORY_CARE_PROVIDER_SITE_OTHER): Payer: Medicare Other | Admitting: Cardiovascular Disease

## 2017-08-13 ENCOUNTER — Encounter: Payer: Self-pay | Admitting: Cardiovascular Disease

## 2017-08-13 VITALS — BP 136/67 | HR 77 | Ht 62.0 in | Wt 177.0 lb

## 2017-08-13 DIAGNOSIS — J441 Chronic obstructive pulmonary disease with (acute) exacerbation: Secondary | ICD-10-CM | POA: Diagnosis not present

## 2017-08-13 DIAGNOSIS — R0602 Shortness of breath: Secondary | ICD-10-CM

## 2017-08-13 DIAGNOSIS — I25118 Atherosclerotic heart disease of native coronary artery with other forms of angina pectoris: Secondary | ICD-10-CM

## 2017-08-13 DIAGNOSIS — I447 Left bundle-branch block, unspecified: Secondary | ICD-10-CM

## 2017-08-13 DIAGNOSIS — I10 Essential (primary) hypertension: Secondary | ICD-10-CM

## 2017-08-13 DIAGNOSIS — I712 Thoracic aortic aneurysm, without rupture: Secondary | ICD-10-CM | POA: Diagnosis not present

## 2017-08-13 DIAGNOSIS — I7121 Aneurysm of the ascending aorta, without rupture: Secondary | ICD-10-CM

## 2017-08-13 DIAGNOSIS — R062 Wheezing: Secondary | ICD-10-CM

## 2017-08-13 DIAGNOSIS — I5042 Chronic combined systolic (congestive) and diastolic (congestive) heart failure: Secondary | ICD-10-CM | POA: Diagnosis not present

## 2017-08-13 MED ORDER — PREDNISONE 5 MG PO TABS: ORAL_TABLET | ORAL | 0 refills | Status: DC

## 2017-08-13 NOTE — Progress Notes (Signed)
SUBJECTIVE: The patient presents for routine follow-up.  She was evaluated in the ED on 07/20/17 for shortness of breath.  I reviewed all relevant duct mentation, labs, and studies.  She was hypertensive with a blood pressure of 163/86.  She was noted to have expiratory wheezing on physical exam. Chest x-ray and troponins were without acute abnormalities.  BNP was mildly elevated at 297.  Chest x-ray showed COPD.  She was given prednisone, azithromycin, and albuterol.  ECG which I personally reviewed demonstrated sinus rhythm with a left bundle branch block.  She underwent drug-eluting stent placement to the proximal and mid LAD as well as the first marginal branch on 11/23/16.  Echocardiogram 11/24/16: Mildly reduced left ventricular systolic function, LVEF 62-70%, diffuse hypokinesis, grade 1 diastolic dysfunction, moderate aortic regurgitation.  She is very short of breath this morning.  Oxygen saturations in our office are 94%.  She usually uses her nebulizer at night.  She used her albuterol inhaler this morning without any significant relief.  She denies exertional chest pain.  She describes arthritic pain in both her shoulders.      Review of Systems: As per "subjective", otherwise negative.  Allergies  Allergen Reactions  . Plavix [Clopidogrel Bisulfate] Itching    Severe itching  . Alprazolam Nausea And Vomiting  . Codeine Nausea And Vomiting  . Percodan [Oxycodone-Aspirin] Nausea And Vomiting  . Valium Nausea And Vomiting    Current Outpatient Medications  Medication Sig Dispense Refill  . albuterol (PROVENTIL) (2.5 MG/3ML) 0.083% nebulizer solution Take 3 mLs (2.5 mg total) by nebulization every 6 (six) hours as needed for wheezing or shortness of breath. 75 mL 12  . aspirin EC 81 MG tablet Take 81 mg by mouth every morning.     Marland Kitchen atorvastatin (LIPITOR) 80 MG tablet Take 1 tablet (80 mg total) by mouth daily at 6 PM. 30 tablet 6  . carvedilol (COREG) 6.25 MG tablet  TAKE ONE TABLET BY MOUTH 2 TIMES A DAY. 180 tablet 1  . clorazepate (TRANXENE) 7.5 MG tablet Take 7.5 mg by mouth daily as needed for anxiety. For nerves    . meclizine (ANTIVERT) 25 MG tablet Take 1 or 2  Every 6 hours (Patient taking differently: Take 12.5-25 mg by mouth 4 (four) times daily as needed for dizziness. ) 100 tablet 3  . nitroGLYCERIN (NITROSTAT) 0.4 MG SL tablet Place 1 tablet (0.4 mg total) under the tongue every 5 (five) minutes x 3 doses as needed for chest pain. 25 tablet 12  . prasugrel (EFFIENT) 10 MG TABS tablet Take 10 mg by mouth daily.    Marland Kitchen PROAIR HFA 108 (90 BASE) MCG/ACT inhaler Inhale 2 puffs into the lungs every 6 (six) hours as needed for wheezing or shortness of breath.     . sacubitril-valsartan (ENTRESTO) 24-26 MG Take 1 tablet by mouth 2 (two) times daily. 60 tablet 6  . traMADol (ULTRAM) 50 MG tablet Take 50 mg by mouth daily as needed for moderate pain or severe pain. Maximum dose= 8 tablets per day     No current facility-administered medications for this visit.     Past Medical History:  Diagnosis Date  . Anxiety   . Cervical disc disorder with myelopathy, unspecified cervical region   . Chronic systolic (congestive) heart failure ([place])   . COPD (chronic obstructive pulmonary disease) ([place])   . Essential hypertension   . Hemorrhoids   . Liver mass   . Lung, cysts, congenital  Left lung cyst  . Myocardial infarction ([place])   . Nephrolithiasis    Embedded  . Nonischemic cardiomyopathy ([place])    LVEF 35-40% 2015  . Osteoarthritis   . Thoracic ascending aortic aneurysm ([facility])    4.3 cm April 2016    Past Surgical History:  Procedure Laterality Date  . Benign breast tumors    . CHOLECYSTECTOMY    . COLONOSCOPY    . COLONOSCOPY N/A 09/22/2014   Procedure: COLONOSCOPY;  Surgeon: Rogene Houston, MD;  [facility];  Service: Endoscopy;  Laterality: N/A;  830 -- to be done in OR under fluoro  . Complete hysterectomy    . CORONARY STENT  INTERVENTION N/A 11/23/2016   Procedure: Coronary Stent Intervention;  Surgeon: Martinique, Peter M, MD;  Location: [facility] CV LAB;  Service: Cardiovascular;  Laterality: N/A;  . LEFT HEART CATH AND CORONARY ANGIOGRAPHY N/A 11/23/2016   Procedure: Left Heart Cath and Coronary Angiography;  Surgeon: Martinique, Peter M, MD;  Location: [facility] CV LAB;  Service: Cardiovascular;  Laterality: N/A;  . TONSILLECTOMY AND ADENOIDECTOMY      Social History   Socioeconomic History  . Marital status: Widowed    Spouse name: Not on file  . Number of children: Not on file  . Years of education: 9th  . Highest education level: Not on file  Occupational History    Employer: RETIRED  Social Needs  . Financial resource strain: Not on file  . Food insecurity:    Worry: Not on file    Inability: Not on file  . Transportation needs:    Medical: Not on file    Non-medical: Not on file  Tobacco Use  . Smoking status: Former Smoker    Packs/day: 0.25    Years: 31.00    Pack years: 7.75    Types: Cigarettes    Start date: 05/14/1977    Last attempt to quit: 05/28/2008    Years since quitting: 9.2  . Smokeless tobacco: Never Used  Substance and Sexual Activity  . Alcohol use: No    Alcohol/week: 0.0 oz  . Drug use: No  . Sexual activity: Never  Lifestyle  . Physical activity:    Days per week: Not on file    Minutes per session: Not on file  . Stress: Not on file  Relationships  . Social connections:    Talks on phone: Not on file    Gets together: Not on file    Attends religious service: Not on file    Active member of club or organization: Not on file    Attends meetings of clubs or organizations: Not on file    Relationship status: Not on file  . Intimate partner violence:    Fear of current or ex partner: Not on file    Emotionally abused: Not on file    Physically abused: Not on file    Forced sexual activity: Not on file  Other Topics Concern  . Not on file  Social History  Narrative  . Not on file     Vitals:   08/13/17 0851  BP: 136/67  Pulse: 77  SpO2: 94%  Weight: 177 lb (80.3 kg)  Height: 5\' 2"  (1.575 m)    Wt Readings from Last 3 Encounters:  08/13/17 177 lb (80.3 kg)  07/20/17 174 lb (78.9 kg)  05/10/17 174 lb 13.2 oz (79.3 kg)     PHYSICAL EXAM General: NAD HEENT: Normal. Neck: No JVD, no  thyromegaly. Lungs: Prolonged expiratory phase with expiratory wheezes bilaterally, no crackles. CV: Regular rate and rhythm, normal S1/S2, no S3/S4, no murmur. No pretibial or periankle edema.    Abdomen: Soft, nontender, no distention.  Neurologic: Alert and oriented.  Psych: Normal affect. Skin: Normal. Musculoskeletal: No gross deformities.    ECG: Most recent ECG reviewed.   Labs: Lab Results  Component Value Date/Time   K 3.7 07/20/2017 05:41 PM   BUN 12 07/20/2017 05:41 PM   CREATININE 0.65 07/20/2017 05:41 PM   CREATININE 0.72 08/29/2016 12:42 PM   ALT 26 05/10/2017 12:09 PM   HGB 13.0 07/20/2017 05:41 PM     Lipids: Lab Results  Component Value Date/Time   LDLCALC 115 (H) 11/23/2016 07:37 AM   CHOL 204 (H) 11/23/2016 07:37 AM   TRIG 50 11/23/2016 07:37 AM   HDL 79 11/23/2016 07:37 AM       ASSESSMENT AND PLAN:  1.  Coronary artery disease with stenting of the LAD and OM1 in July 2018: Symptomatically stable. Continue dual antiplatelet therapy through July 2019.  Brilinta presumably led to increased shortness of breath from baseline.  She was switched to Plavix but this led to severe itching.  She is tolerating Effient 10 mg daily.  Continue carvedilol and Lipitor.  She is on an angiotensin receptor blocker given her history of non-STEMI.  2.  Chronic hypertension: Blood pressure is normal. No changes to therapy.  3.  Thoracic aortic aneurysm: Stable at 4.2 cm on 11/22/16.  I will repeat this summer.  4.  Chronic systolic heart failure: Euvolemic and stable.  Continue carvedilol and Entresto. No diuretic requirement  at this time.  5.  Shortness of breath with wheezing/COPD exacerbation: I will start prednisone 5 mg daily for 5 days.  I instructed her to go home and use her nebulizer as well.  She should follow-up with Dr. Luan Pulling, her PCP and pulmonologist.   Disposition: Follow up 6 months   Kate Sable, M.D., F.A.C.C.

## 2017-08-13 NOTE — Patient Instructions (Signed)
Your physician wants you to follow-up in:  6 months with Dr Virgina Jock will receive a reminder letter in the mail two months in advance. If you don't receive a letter, please call our office to schedule the follow-up appointment.     Take prednisone 5 mg daily for 5 days and then STOP    Use inhalers at home     No other changes in medications     No lab work or tests ordered today.      Thank you for choosing [facility] !

## 2017-08-18 ENCOUNTER — Emergency Department (HOSPITAL_COMMUNITY): Payer: Medicare Other

## 2017-08-18 ENCOUNTER — Emergency Department (HOSPITAL_COMMUNITY)
Admission: EM | Admit: 2017-08-18 | Discharge: 2017-08-18 | Disposition: A | Discharge status: Home / Self Care | Payer: Medicare Other | Attending: Emergency Medicine | Admitting: Emergency Medicine

## 2017-08-18 ENCOUNTER — Encounter (HOSPITAL_COMMUNITY): Payer: Self-pay

## 2017-08-18 DIAGNOSIS — I5042 Chronic combined systolic (congestive) and diastolic (congestive) heart failure: Secondary | ICD-10-CM | POA: Diagnosis not present

## 2017-08-18 DIAGNOSIS — J441 Chronic obstructive pulmonary disease with (acute) exacerbation: Secondary | ICD-10-CM | POA: Diagnosis not present

## 2017-08-18 DIAGNOSIS — Z955 Presence of coronary angioplasty implant and graft: Secondary | ICD-10-CM | POA: Diagnosis not present

## 2017-08-18 DIAGNOSIS — I447 Left bundle-branch block, unspecified: Secondary | ICD-10-CM | POA: Diagnosis not present

## 2017-08-18 DIAGNOSIS — Z87891 Personal history of nicotine dependence: Secondary | ICD-10-CM | POA: Insufficient documentation

## 2017-08-18 DIAGNOSIS — Z79899 Other long term (current) drug therapy: Secondary | ICD-10-CM | POA: Diagnosis not present

## 2017-08-18 DIAGNOSIS — J302 Other seasonal allergic rhinitis: Secondary | ICD-10-CM | POA: Diagnosis not present

## 2017-08-18 DIAGNOSIS — Z7982 Long term (current) use of aspirin: Secondary | ICD-10-CM | POA: Insufficient documentation

## 2017-08-18 DIAGNOSIS — I2510 Atherosclerotic heart disease of native coronary artery without angina pectoris: Secondary | ICD-10-CM | POA: Insufficient documentation

## 2017-08-18 DIAGNOSIS — R0682 Tachypnea, not elsewhere classified: Secondary | ICD-10-CM | POA: Diagnosis not present

## 2017-08-18 DIAGNOSIS — I110 Hypertensive heart disease with heart failure: Secondary | ICD-10-CM | POA: Diagnosis not present

## 2017-08-18 DIAGNOSIS — R42 Dizziness and giddiness: Secondary | ICD-10-CM | POA: Diagnosis not present

## 2017-08-18 DIAGNOSIS — R0602 Shortness of breath: Secondary | ICD-10-CM | POA: Diagnosis not present

## 2017-08-18 LAB — BASIC METABOLIC PANEL
Anion gap: 11 (ref 5–15)
BUN: 12 mg/dL (ref 6–20)
CO2: 25 mmol/L (ref 22–32)
Calcium: 9.8 mg/dL (ref 8.9–10.3)
Chloride: 107 mmol/L (ref 101–111)
Creatinine, Ser: 0.58 mg/dL (ref 0.44–1.00)
GFR calc Af Amer: >60 mL/min (ref >60)
GFR calc non Af Amer: >60 mL/min (ref >60)
Glucose, Bld: 104 mg/dL — ABNORMAL HIGH (ref 65–99)
Potassium: 3.4 mmol/L — ABNORMAL LOW (ref 3.5–5.1)
Sodium: 143 mmol/L (ref 135–145)

## 2017-08-18 LAB — CBC
HCT: 38.7 % (ref 36.0–46.0)
Hemoglobin: 12.3 g/dL (ref 12.0–15.0)
MCH: 26.2 pg (ref 26.0–34.0)
MCHC: 31.8 g/dL (ref 30.0–36.0)
MCV: 82.5 fL (ref 78.0–100.0)
Platelets: 280 10*3/uL (ref 150–400)
RBC: 4.69 MIL/uL (ref 3.87–5.11)
RDW: 14.3 % (ref 11.5–15.5)
WBC: 6.7 10*3/uL (ref 4.0–10.5)

## 2017-08-18 LAB — TROPONIN I: Troponin I: <0.03 ng/mL (ref <0.03)

## 2017-08-18 MED ORDER — IPRATROPIUM-ALBUTEROL 0.5-2.5 (3) MG/3ML IN SOLN
3.0000 mL | Freq: Once | RESPIRATORY_TRACT | Status: AC
  Administered 2017-08-18: 3 mL via RESPIRATORY_TRACT
  Filled 2017-08-18: qty 3

## 2017-08-18 MED ORDER — PREDNISONE 20 MG PO TABS
40.0000 mg | ORAL_TABLET | Freq: Once | ORAL | Status: AC
  Administered 2017-08-18: 40 mg via ORAL
  Filled 2017-08-18: qty 2

## 2017-08-18 MED ORDER — MONTELUKAST SODIUM 10 MG PO TABS: 10.0000 mg | ORAL_TABLET | Freq: Every day | ORAL | 0 refills | Status: DC

## 2017-08-18 MED ORDER — PREDNISONE 20 MG PO TABS: 40.0000 mg | ORAL_TABLET | Freq: Every day | ORAL | 0 refills | Status: DC

## 2017-08-18 NOTE — ED Notes (Signed)
Pt transported to xray 

## 2017-08-18 NOTE — ED Triage Notes (Addendum)
Pt states she woke up feeling SOB, used her inhaler twice without relief. Reports she had been on ABT and prednisone for URI Sick for 10 days EMS reports sats 96% on room air.Pt reports intermittent CP for one week and feeling dizzy

## 2017-08-18 NOTE — ED Provider Notes (Signed)
[facility] EMERGENCY DEPARTMENT Provider Note   CSN: [id number] Arrival date & time: 08/18/17  0754     History   Chief Complaint Chief Complaint  Patient presents with  . Shortness of Breath    HPI [name] is a 82 y.o. female.  HPI  The patient is a very sweet 82 year old female who unfortunately has a history of COPD as well as anxiety, she has had chronic neck pain, congestive heart failure related to myocardial infarction and a nonischemic cardiomyopathy.  She no longer smokes cigarettes and has not in quite some time but it is not unusual for her to have some shortness of breath.  I saw her approximately 1 month ago for similar symptoms during which time she appeared to have some COPD exacerbation and improved significantly with treatment and states that since that time she has done quite well.  She presents today with a chief complaint of shortness of breath.  She reports that she was in her usual state of health this morning when she woke up.  Reports that she usually has a little bit of vertigo when she first gets out of bed but this goes away and she is able to get up and walk to the kitchen for a cup of coffee.  On drinking her coffee she felt like she had increasing shortness of breath with wheezing which only got slightly improved with albuterol.  She arrives by paramedic transport, paramedics noted that she was wheezing prehospital, nebulized treatments were given, the patient states that she feels better but is still short of breath.  The patient also reports that approximately 5 days ago she was seen at the cardiologist office, review of the medical record shows that she did see Dr. Jacinta Shoe (Cardiology), he noted that she had ongoing shortness of breath, wheezing and prescribed 5 mg of prednisone daily for 5 days.  The patient states she has been taking that without significant improvement  During the discussion the patient becomes very tearful talking about all of  her medical problems including her "neck full of problems" and her "brain tumor".  Review of the medical record shows that the patient has had a CT scan showing a meningioma but no other significant brain abnormalities.  Past Medical History:  Diagnosis Date  . Anxiety   . Cervical disc disorder with myelopathy, unspecified cervical region   . Chronic systolic (congestive) heart failure ([place])   . COPD (chronic obstructive pulmonary disease) ([place])   . Essential hypertension   . Hemorrhoids   . Liver mass   . Lung, cysts, congenital    Left lung cyst  . Myocardial infarction ([place])   . Nephrolithiasis    Embedded  . Nonischemic cardiomyopathy ([place])    LVEF 35-40% 2015  . Osteoarthritis   . Thoracic ascending aortic aneurysm ([facility])    4.3 cm April 2016    Patient Active Problem List   Diagnosis Date Noted  . Dizziness 04/01/2017  . COPD (chronic obstructive pulmonary disease) ([place]) 04/01/2017  . CAD (coronary artery disease) 04/01/2017  . NSTEMI (non-ST elevated myocardial infarction) ([place]) 11/23/2016  . Hypertensive cardiovascular disease 10/03/2015  . Protein-calorie malnutrition, severe 07/13/2015  . Demand ischemia of myocardium ([place]) 07/13/2015  . Nonischemic cardiomyopathy ([place])   . Atypical chest pain   . Left bundle branch block   . COPD exacerbation ([place]) 07/11/2015  . Flu-like symptoms 07/11/2015  . Elevated troponin 07/11/2015  . Acute bronchitis 07/11/2015  . GERD (gastroesophageal reflux disease)  07/11/2015  . Bronchospasm   . Ascending aortic aneurysm ([place]) 01/27/2015  . Chronic combined systolic and diastolic CHF (congestive heart failure) ([place]) 01/27/2015  . Chest pain at rest 01/27/2015  . Essential hypertension 01/27/2015  . Anxiety 01/27/2015  . Chronic pain 01/27/2015  . Chest pain 08/20/2013  . Hypertension 08/20/2013  . Contusion of left knee 08/07/2012  . Sprain of wrist 08/07/2012  . Liver mass 05/21/2011  . Aneurysm ([place]) 05/21/2011  .  Abdominal pain 05/21/2011  . Bronchitis 05/21/2011  . De Quervain's disease (tenosynovitis) 04/02/2011  . SHOULDER PAIN 01/19/2009  . CERVICALGIA 01/19/2009  . IMPINGEMENT SYNDROME 01/19/2009    Past Surgical History:  Procedure Laterality Date  . Benign breast tumors    . CHOLECYSTECTOMY    . COLONOSCOPY    . COLONOSCOPY N/A 09/22/2014   Procedure: COLONOSCOPY;  Surgeon: Rogene Houston, MD;  Location: [facility];  Service: Endoscopy;  Laterality: N/A;  830 -- to be done in OR under fluoro  . Complete hysterectomy    . CORONARY STENT INTERVENTION N/A 11/23/2016   Procedure: Coronary Stent Intervention;  Surgeon: Martinique, Peter M, MD;  Location: [place] [facility];  Service: Cardiovascular;  Laterality: N/A;  . LEFT HEART CATH AND CORONARY ANGIOGRAPHY N/A 11/23/2016   Procedure: Left Heart Cath and Coronary Angiography;  Surgeon: Martinique, Peter M, MD;  Location: [place] [facility];  Service: Cardiovascular;  Laterality: N/A;  . TONSILLECTOMY AND ADENOIDECTOMY       OB History   None      Home Medications    Prior to Admission medications   Medication Sig Start Date End Date Taking? Authorizing Provider  albuterol (PROVENTIL) (2.5 MG/3ML) 0.083% nebulizer solution Take 3 mLs (2.5 mg total) by nebulization every 6 (six) hours as needed for wheezing or shortness of breath. 07/13/15  Yes Sinda Du, MD  aspirin EC 81 MG tablet Take 81 mg by mouth every morning.    Yes [provider]  atorvastatin (LIPITOR) 80 MG tablet Take 1 tablet (80 mg total) by mouth daily at 6 PM. 11/24/16  Yes Bhagat, Bhavinkumar, PA  carvedilol (COREG) 6.25 MG tablet TAKE ONE TABLET BY MOUTH 2 TIMES A DAY. 04/16/17  Yes Herminio Commons, MD  clorazepate (TRANXENE) 7.5 MG tablet Take 7.5 mg by mouth daily as needed for anxiety. For nerves   Yes [provider]  meclizine (ANTIVERT) 25 MG tablet Take 1 or 2  Every 6 hours Patient taking differently: Take 12.5-25 mg by mouth 4 (four)  times daily as needed for dizziness.  04/03/17  Yes Sinda Du, MD  nitroGLYCERIN (NITROSTAT) 0.4 MG SL tablet Place 1 tablet (0.4 mg total) under the tongue every 5 (five) minutes x 3 doses as needed for chest pain. 11/24/16  Yes Bhagat, Bhavinkumar, PA  prasugrel (EFFIENT) 10 MG TABS tablet Take 10 mg by mouth daily.   Yes [provider]  PROAIR HFA 108 (90 BASE) MCG/ACT inhaler Inhale 2 puffs into the lungs every 6 (six) hours as needed for wheezing or shortness of breath.  02/08/14  Yes [provider]  sacubitril-valsartan (ENTRESTO) 24-26 MG Take 1 tablet by mouth 2 (two) times daily. 08/29/16  Yes Herminio Commons, MD  traMADol (ULTRAM) 50 MG tablet Take 50 mg by mouth daily as needed for moderate pain or severe pain. Maximum dose= 8 tablets per day   Yes [provider]  montelukast (SINGULAIR) 10 MG tablet Take 1 tablet (10 mg total)  by mouth at bedtime. 08/18/17 09/17/17  Noemi Chapel, MD  predniSONE (DELTASONE) 20 MG tablet Take 2 tablets (40 mg total) by mouth daily. 08/18/17   Noemi Chapel, MD    Family History Family History  Problem Relation Age of Onset  . Heart disease Mother   . Aneurysm Father   . Lung cancer Brother   . Heart disease Sister   . Diabetes Brother   . Heart disease Brother     Social History Social History   Tobacco Use  . Smoking status: Former Smoker    Packs/day: 0.25    Years: 31.00    Pack years: 7.75    Types: Cigarettes    Start date: 05/14/1977    Last attempt to quit: 05/28/2008    Years since quitting: 9.2  . Smokeless tobacco: Never Used  Substance Use Topics  . Alcohol use: No    Alcohol/week: 0.0 oz  . Drug use: No     Allergies   Plavix [clopidogrel bisulfate]; Alprazolam; Codeine; Percodan [oxycodone-aspirin]; and Valium   Review of Systems Review of Systems  All other systems reviewed and are negative.    Physical Exam Updated Vital Signs BP (!) 168/67   Pulse 68   Temp 98.3 F (36.8  C) (Oral)   Resp 16   Wt 80.3 kg (177 lb)   SpO2 93%   BMI 32.37 kg/m   Physical Exam  Constitutional: She appears well-developed and well-nourished. No distress.  HENT:  Head: Normocephalic and atraumatic.  Mouth/Throat: Oropharynx is clear and moist. No oropharyngeal exudate.  Eyes: Pupils are equal, round, and reactive to light. Conjunctivae and EOM are normal. Right eye exhibits no discharge. Left eye exhibits no discharge. No scleral icterus.  Neck: Normal range of motion. Neck supple. No JVD present. No thyromegaly present.  Cardiovascular: Normal rate, regular rhythm, normal heart sounds and intact distal pulses. Exam reveals no gallop and no friction rub.  No murmur heard. Pulmonary/Chest: Effort normal. No respiratory distress. She has wheezes ( expiratory wheezing bilaterally). She has no rales.  Speaks in full sentences No increased WOB No rales / ronchi Occasional coughing fits during deep breathing  Abdominal: Soft. Bowel sounds are normal. She exhibits no distension and no mass. There is no tenderness.  Musculoskeletal: Normal range of motion. She exhibits no edema or tenderness.  No edema  Lymphadenopathy:    She has no cervical adenopathy.  Neurological: She is alert. Coordination normal.  Skin: Skin is warm and dry. No rash noted. No erythema.  Psychiatric: She has a normal mood and affect. Her behavior is normal.  Nursing note and vitals reviewed.    ED Treatments / Results  Labs (all labs ordered are listed, but only abnormal results are displayed) Labs Reviewed  BASIC METABOLIC PANEL - Abnormal; Notable for the following components:      Result Value   Potassium 3.4 (*)    Glucose, Bld 104 (*)    All other components within normal limits  CBC  TROPONIN I    EKG EKG Interpretation  Date/Time:  Sunday August 18 2017 08:01:53 EDT Ventricular Rate:  71 PR Interval:    QRS Duration: 175 QT Interval:  440 QTC Calculation: 479 R Axis:   19 Text  Interpretation:  Sinus rhythm Left bundle branch block since last tracing no significant change Confirmed by Noemi Chapel [phone]) on 08/18/2017 8:15:33 AM   Radiology Dg Chest 2 View  Result Date: 08/18/2017 CLINICAL DATA:  82 year old female with shortness  of breath EXAM: CHEST - 2 VIEW COMPARISON:  Prior chest x-ray 07/20/2017 FINDINGS: Stable cardiomegaly. Trace atherosclerotic calcifications present in the transverse aorta. Pulmonary vascular congestion without overt edema. Hyperinflation and central bronchitic changes are similar compared to prior. No acute osseous abnormality. No pleural effusion or pneumothorax. Left shoulder arthroplasty. IMPRESSION: 1. Stable chest x-ray without evidence of acute cardiopulmonary process. 2. Stable cardiomegaly and pulmonary vascular congestion without overt edema. 3. Hyperinflation and chronic bronchitic changes suggest underlying COPD. 4.  Aortic Atherosclerosis (ICD10-170.0) Electronically Signed   By: Jacqulynn Cadet M.D.   On: 08/18/2017 09:20    Procedures Procedures (including critical care time)  Medications Ordered in ED Medications  ipratropium-albuterol (DUONEB) 0.5-2.5 (3) MG/3ML nebulizer solution 3 mL (3 mLs Nebulization Given 08/18/17 0850)  predniSONE (DELTASONE) tablet 40 mg (40 mg Oral Given 08/18/17 0830)     Initial Impression / Assessment and Plan / ED Course  I have reviewed the triage vital signs and the nursing notes.  Pertinent labs & imaging results that were available during my care of the patient were reviewed by me and considered in my medical decision making (see chart for details).  Clinical Course as of Aug 19 950  Nancy Fetter Aug 18, 2017  0901 On my interpretation after viewing the images, of the 2 view PA and lateral CXR, there is no infiltrate, no PTX, slight cardiomegaly - lungs otherwise clear other than some atelectasis or small effusion at the bases.  Compared with prior CXR, this is unchanged   [BM]  0902 CBC [BM]    0902 WBC: 6.7 [BM]  0902 Hemoglobin: 12.3 [BM]  0902 CBC normal  Platelets: 280 [BM]    Clinical Course User Index [BM] Noemi Chapel, MD    The patient has ongoing shortness of breath, she does not appear to be in distress and her oxygen level is 98% on room air.  She is not tachycardic, she has a persistent left bundle branch block which is not new.  Will obtain a chest x-ray, get a breathing treatment and some prednisone.  She has multiple other causes that could be causing her shortness of breath however without rales JVD or peripheral edema it makes congestive heart failure less likely, will check a chest x-ray to rule out infiltrate or pulmonary edema or pneumothorax as she does have underlying obstructive pulmonary disease.  The patient on repeat evaluation has clear lungs with no wheezing, decreased respiratory effort and is feeling much better.  She is speaking in full sentences with no wheezing, no rales, no rhonchi and has been informed of her results.  I feel that her process is more likely related to underlying allergies flaring up her COPD as she states that progressively over 3 weeks she has had increasing runny nose, sneezing, congestion, coughing and wheezing.  In addition to her home medications we will add a course of higher dose prednisone at 40 mg a day for 5 days as well as Singulair antihistamines etc.  The patient is in agreement with this plan and appears stable for discharge  Final Clinical Impressions(s) / ED Diagnoses   Final diagnoses:  COPD exacerbation ([place])  Seasonal allergies    ED Discharge Orders        Ordered    predniSONE (DELTASONE) 20 MG tablet  Daily     08/18/17 0951    montelukast (SINGULAIR) 10 MG tablet  Daily at bedtime     08/18/17 0951       Noemi Chapel, MD  08/18/17 0952  

## 2017-08-18 NOTE — Discharge Instructions (Signed)
Singulair - one tablet daily (10mg ) Zyrtec - one tablet at bedtime (10mg ) Prednisone 40mg  daily for 5 days Albuterol 2 puffs every 4 hours as needed for wheezing / coughing or shortness of breath.  Please obtain all of your results from medical records or have your doctors office obtain the results - share them with your doctor - you should be seen at your doctors office in the next 2 days. Call today to arrange your follow up. Take the medications as prescribed. Please review all of the medicines and only take them if you do not have an allergy to them. Please be aware that if you are taking birth control pills, taking other prescriptions, ESPECIALLY ANTIBIOTICS may make the birth control ineffective - if this is the case, either do not engage in sexual activity or use alternative methods of birth control such as condoms until you have finished the medicine and your family doctor says it is OK to restart them. If you are on a blood thinner such as COUMADIN, be aware that any other medicine that you take may cause the coumadin to either work too much, or not enough - you should have your coumadin level rechecked in next 7 days if this is the case.  ?  It is also a possibility that you have an allergic reaction to any of the medicines that you have been prescribed - Everybody reacts differently to medications and while MOST people have no trouble with most medicines, you may have a reaction such as nausea, vomiting, rash, swelling, shortness of breath. If this is the case, please stop taking the medicine immediately and contact your physician.  ?  You should return to the ER if you develop severe or worsening symptoms.

## 2017-08-20 DIAGNOSIS — I2510 Atherosclerotic heart disease of native coronary artery without angina pectoris: Secondary | ICD-10-CM | POA: Diagnosis not present

## 2017-08-20 DIAGNOSIS — J449 Chronic obstructive pulmonary disease, unspecified: Secondary | ICD-10-CM | POA: Diagnosis not present

## 2017-08-20 DIAGNOSIS — I10 Essential (primary) hypertension: Secondary | ICD-10-CM | POA: Diagnosis not present

## 2017-08-20 DIAGNOSIS — I5022 Chronic systolic (congestive) heart failure: Secondary | ICD-10-CM | POA: Diagnosis not present

## 2017-09-23 DIAGNOSIS — J449 Chronic obstructive pulmonary disease, unspecified: Secondary | ICD-10-CM | POA: Diagnosis not present

## 2017-09-23 DIAGNOSIS — I5022 Chronic systolic (congestive) heart failure: Secondary | ICD-10-CM | POA: Diagnosis not present

## 2017-09-23 DIAGNOSIS — I2510 Atherosclerotic heart disease of native coronary artery without angina pectoris: Secondary | ICD-10-CM | POA: Diagnosis not present

## 2017-09-23 DIAGNOSIS — M5000 Cervical disc disorder with myelopathy, unspecified cervical region: Secondary | ICD-10-CM | POA: Diagnosis not present

## 2017-09-26 DIAGNOSIS — J449 Chronic obstructive pulmonary disease, unspecified: Secondary | ICD-10-CM | POA: Diagnosis not present

## 2017-10-11 ENCOUNTER — Other Ambulatory Visit: Payer: Self-pay

## 2017-10-11 ENCOUNTER — Encounter (HOSPITAL_COMMUNITY): Payer: Self-pay | Admitting: Emergency Medicine

## 2017-10-11 ENCOUNTER — Emergency Department (HOSPITAL_COMMUNITY)
Admission: EM | Admit: 2017-10-11 | Discharge: 2017-10-11 | Disposition: A | Discharge status: Home / Self Care | Payer: Medicare Other | Attending: Emergency Medicine | Admitting: Emergency Medicine

## 2017-10-11 ENCOUNTER — Emergency Department (HOSPITAL_COMMUNITY): Payer: Medicare Other

## 2017-10-11 DIAGNOSIS — Z7982 Long term (current) use of aspirin: Secondary | ICD-10-CM | POA: Insufficient documentation

## 2017-10-11 DIAGNOSIS — R0602 Shortness of breath: Secondary | ICD-10-CM | POA: Diagnosis not present

## 2017-10-11 DIAGNOSIS — Z79899 Other long term (current) drug therapy: Secondary | ICD-10-CM | POA: Diagnosis not present

## 2017-10-11 DIAGNOSIS — J441 Chronic obstructive pulmonary disease with (acute) exacerbation: Secondary | ICD-10-CM | POA: Diagnosis not present

## 2017-10-11 DIAGNOSIS — I5042 Chronic combined systolic (congestive) and diastolic (congestive) heart failure: Secondary | ICD-10-CM | POA: Insufficient documentation

## 2017-10-11 DIAGNOSIS — I252 Old myocardial infarction: Secondary | ICD-10-CM | POA: Insufficient documentation

## 2017-10-11 DIAGNOSIS — I2510 Atherosclerotic heart disease of native coronary artery without angina pectoris: Secondary | ICD-10-CM | POA: Insufficient documentation

## 2017-10-11 DIAGNOSIS — R05 Cough: Secondary | ICD-10-CM | POA: Diagnosis not present

## 2017-10-11 DIAGNOSIS — Z955 Presence of coronary angioplasty implant and graft: Secondary | ICD-10-CM | POA: Diagnosis not present

## 2017-10-11 DIAGNOSIS — I110 Hypertensive heart disease with heart failure: Secondary | ICD-10-CM | POA: Diagnosis not present

## 2017-10-11 DIAGNOSIS — Z87891 Personal history of nicotine dependence: Secondary | ICD-10-CM | POA: Diagnosis not present

## 2017-10-11 DIAGNOSIS — J449 Chronic obstructive pulmonary disease, unspecified: Secondary | ICD-10-CM | POA: Diagnosis not present

## 2017-10-11 HISTORY — DX: Dependence on supplemental oxygen: Z99.81

## 2017-10-11 LAB — CBC WITH DIFFERENTIAL/PLATELET
Basophils Absolute: 0.1 10*3/uL (ref 0.0–0.1)
Basophils Relative: 1 %
Eosinophils Absolute: 0.9 10*3/uL — ABNORMAL HIGH (ref 0.0–0.7)
Eosinophils Relative: 13 %
HCT: 37.6 % (ref 36.0–46.0)
Hemoglobin: 11.8 g/dL — ABNORMAL LOW (ref 12.0–15.0)
Lymphocytes Relative: 29 %
Lymphs Abs: 2 10*3/uL (ref 0.7–4.0)
MCH: 26.3 pg (ref 26.0–34.0)
MCHC: 31.4 g/dL (ref 30.0–36.0)
MCV: 83.7 fL (ref 78.0–100.0)
Monocytes Absolute: 0.7 10*3/uL (ref 0.1–1.0)
Monocytes Relative: 10 %
Neutro Abs: 3.3 10*3/uL (ref 1.7–7.7)
Neutrophils Relative %: 47 %
Platelets: 271 10*3/uL (ref 150–400)
RBC: 4.49 MIL/uL (ref 3.87–5.11)
RDW: 14.3 % (ref 11.5–15.5)
WBC: 7 10*3/uL (ref 4.0–10.5)

## 2017-10-11 LAB — BASIC METABOLIC PANEL
Anion gap: 8 (ref 5–15)
BUN: 12 mg/dL (ref 6–20)
CO2: 28 mmol/L (ref 22–32)
Calcium: 9.7 mg/dL (ref 8.9–10.3)
Chloride: 105 mmol/L (ref 101–111)
Creatinine, Ser: 0.74 mg/dL (ref 0.44–1.00)
GFR calc Af Amer: >60 mL/min (ref >60)
GFR calc non Af Amer: >60 mL/min (ref >60)
Glucose, Bld: 159 mg/dL — ABNORMAL HIGH (ref 65–99)
Potassium: 3.8 mmol/L (ref 3.5–5.1)
Sodium: 141 mmol/L (ref 135–145)

## 2017-10-11 LAB — TROPONIN I: Troponin I: <0.03 ng/mL (ref <0.03)

## 2017-10-11 MED ORDER — PREDNISONE 20 MG PO TABS: 40.0000 mg | ORAL_TABLET | Freq: Every day | ORAL | 0 refills | Status: DC

## 2017-10-11 MED ORDER — ALBUTEROL SULFATE (2.5 MG/3ML) 0.083% IN NEBU
INHALATION_SOLUTION | RESPIRATORY_TRACT | Status: AC
  Administered 2017-10-11: 2.5 mg
  Filled 2017-10-11: qty 3

## 2017-10-11 MED ORDER — IPRATROPIUM-ALBUTEROL 0.5-2.5 (3) MG/3ML IN SOLN
3.0000 mL | Freq: Once | RESPIRATORY_TRACT | Status: AC
Start: 2017-10-11 — End: 2017-10-11
  Administered 2017-10-11: 3 mL via RESPIRATORY_TRACT
  Filled 2017-10-11: qty 3

## 2017-10-11 NOTE — ED Notes (Signed)
To Radiology

## 2017-10-11 NOTE — ED Triage Notes (Signed)
Pt c/o sob since yesterday. No relief from breathing tx and inhaler. Pt also stated he power has been out since 4pm and she has not had access to her treatments or oxygen.

## 2017-10-11 NOTE — ED Provider Notes (Signed)
[facility] EMERGENCY DEPARTMENT Provider Note   CSN: [id number] Arrival date & time: 10/11/17  1810     History   Chief Complaint Chief Complaint  Patient presents with  . Shortness of Breath    HPI [name] is a 82 y.o. female.  HPI   82 year old female with shortness of breath.  Worsening since yesterday.  Minimal relief with her albuterol inhaler nebs.  Increased coughing frequency.  Nonproductive.  No unusual leg pain or swelling.  No fevers or chills.  Denies any chest pain.  Past Medical History:  Diagnosis Date  . Anxiety   . Cervical disc disorder with myelopathy, unspecified cervical region   . Chronic systolic (congestive) heart failure ([place])   . COPD (chronic obstructive pulmonary disease) ([place])   . Essential hypertension   . Hemorrhoids   . Liver mass   . Lung, cysts, congenital    Left lung cyst  . Myocardial infarction ([place])   . Nephrolithiasis    Embedded  . Nonischemic cardiomyopathy ([place])    LVEF 35-40% 2015  . On home O2   . Osteoarthritis   . Thoracic ascending aortic aneurysm ([facility])    4.3 cm April 2016    Patient Active Problem List   Diagnosis Date Noted  . Dizziness 04/01/2017  . COPD (chronic obstructive pulmonary disease) ([place]) 04/01/2017  . CAD (coronary artery disease) 04/01/2017  . NSTEMI (non-ST elevated myocardial infarction) ([place]) 11/23/2016  . Hypertensive cardiovascular disease 10/03/2015  . Protein-calorie malnutrition, severe 07/13/2015  . Demand ischemia of myocardium ([place]) 07/13/2015  . Nonischemic cardiomyopathy ([place])   . Atypical chest pain   . Left bundle branch block   . COPD exacerbation ([place]) 07/11/2015  . Flu-like symptoms 07/11/2015  . Elevated troponin 07/11/2015  . Acute bronchitis 07/11/2015  . GERD (gastroesophageal reflux disease) 07/11/2015  . Bronchospasm   . Ascending aortic aneurysm ([place]) 01/27/2015  . Chronic combined systolic and diastolic CHF (congestive heart failure) ([place]) 01/27/2015  .  Chest pain at rest 01/27/2015  . Essential hypertension 01/27/2015  . Anxiety 01/27/2015  . Chronic pain 01/27/2015  . Chest pain 08/20/2013  . Hypertension 08/20/2013  . Contusion of left knee 08/07/2012  . Sprain of wrist 08/07/2012  . Liver mass 05/21/2011  . Aneurysm ([place]) 05/21/2011  . Abdominal pain 05/21/2011  . Bronchitis 05/21/2011  . De Quervain's disease (tenosynovitis) 04/02/2011  . SHOULDER PAIN 01/19/2009  . CERVICALGIA 01/19/2009  . IMPINGEMENT SYNDROME 01/19/2009    Past Surgical History:  Procedure Laterality Date  . Benign breast tumors    . CHOLECYSTECTOMY    . COLONOSCOPY    . COLONOSCOPY N/A 09/22/2014   Procedure: COLONOSCOPY;  Surgeon: Rogene Houston, MD;  Location: [facility];  Service: Endoscopy;  Laterality: N/A;  830 -- to be done in OR under fluoro  . Complete hysterectomy    . CORONARY STENT INTERVENTION N/A 11/23/2016   Procedure: Coronary Stent Intervention;  Surgeon: Martinique, Peter M, MD;  Location: [place] [facility] LAB;  Service: Cardiovascular;  Laterality: N/A;  . LEFT HEART CATH AND CORONARY ANGIOGRAPHY N/A 11/23/2016   Procedure: Left Heart Cath and Coronary Angiography;  Surgeon: Martinique, Peter M, MD;  Location: [place] [facility] LAB;  Service: Cardiovascular;  Laterality: N/A;  . TONSILLECTOMY AND ADENOIDECTOMY       OB History   None      Home Medications    Prior to Admission medications   Medication Sig Start Date End Date Taking? Authorizing  Provider  albuterol (PROVENTIL) (2.5 MG/3ML) 0.083% nebulizer solution Take 3 mLs (2.5 mg total) by nebulization every 6 (six) hours as needed for wheezing or shortness of breath. 07/13/15   Sinda Du, MD  aspirin EC 81 MG tablet Take 81 mg by mouth every morning.     [provider]  atorvastatin (LIPITOR) 80 MG tablet Take 1 tablet (80 mg total) by mouth daily at 6 PM. 11/24/16   Bhagat, Bhavinkumar, PA  carvedilol (COREG) 6.25 MG tablet TAKE ONE TABLET BY MOUTH 2 TIMES A DAY.  04/16/17   Herminio Commons, MD  clorazepate (TRANXENE) 7.5 MG tablet Take 7.5 mg by mouth daily as needed for anxiety. For nerves    [provider]  meclizine (ANTIVERT) 25 MG tablet Take 1 or 2  Every 6 hours Patient taking differently: Take 12.5-25 mg by mouth 4 (four) times daily as needed for dizziness.  04/03/17   Sinda Du, MD  montelukast (SINGULAIR) 10 MG tablet Take 1 tablet (10 mg total) by mouth at bedtime. 08/18/17 09/17/17  Noemi Chapel, MD  nitroGLYCERIN (NITROSTAT) 0.4 MG SL tablet Place 1 tablet (0.4 mg total) under the tongue every 5 (five) minutes x 3 doses as needed for chest pain. 11/24/16   Leanor Kail, PA  prasugrel (EFFIENT) 10 MG TABS tablet Take 10 mg by mouth daily.    [provider]  predniSONE (DELTASONE) 20 MG tablet Take 2 tablets (40 mg total) by mouth daily. 08/18/17   Noemi Chapel, MD  PROAIR HFA 108 (90 BASE) MCG/ACT inhaler Inhale 2 puffs into the lungs every 6 (six) hours as needed for wheezing or shortness of breath.  02/08/14   [provider]  sacubitril-valsartan (ENTRESTO) 24-26 MG Take 1 tablet by mouth 2 (two) times daily. 08/29/16   Herminio Commons, MD  SYMBICORT 160-4.5 MCG/ACT inhaler Inhale 2 puffs into the lungs 2 (two) times daily. 09/23/17   [provider]  traMADol (ULTRAM) 50 MG tablet Take 50 mg by mouth daily as needed for moderate pain or severe pain. Maximum dose= 8 tablets per day    [provider]    Family History Family History  Problem Relation Age of Onset  . Heart disease Mother   . Aneurysm Father   . Lung cancer Brother   . Heart disease Sister   . Diabetes Brother   . Heart disease Brother     Social History Social History   Tobacco Use  . Smoking status: Former Smoker    Packs/day: 0.25    Years: 31.00    Pack years: 7.75    Types: Cigarettes    Start date: 05/14/1977    Last attempt to quit: 05/28/2008    Years since quitting: 9.3  . Smokeless tobacco:  Never Used  Substance Use Topics  . Alcohol use: No    Alcohol/week: 0.0 oz  . Drug use: No     Allergies   Plavix [clopidogrel bisulfate]; Alprazolam; Codeine; Percodan [oxycodone-aspirin]; and Valium   Review of Systems Review of Systems All systems reviewed and negative, other than as noted in HPI.   Physical Exam Updated Vital Signs BP (!) 165/74   Pulse 79   Temp 97.9 F (36.6 C) (Oral)   Resp 17   SpO2 100%   Physical Exam  Constitutional: She appears well-developed and well-nourished. No distress.  HENT:  Head: Normocephalic and atraumatic.  Eyes: Conjunctivae are normal. Right eye exhibits no discharge. Left eye exhibits no discharge.  Neck: Neck supple.  Cardiovascular: Normal rate, regular rhythm and normal heart sounds. Exam reveals no gallop and no friction rub.  No murmur heard. Pulmonary/Chest: Effort normal. No respiratory distress. She has wheezes.  Abdominal: Soft. She exhibits no distension. There is no tenderness.  Musculoskeletal: She exhibits no edema or tenderness.  Neurological: She is alert.  Skin: Skin is warm and dry.  Psychiatric: She has a normal mood and affect. Her behavior is normal. Thought content normal.  Nursing note and vitals reviewed.    ED Treatments / Results  Labs (all labs ordered are listed, but only abnormal results are displayed) Labs Reviewed  CBC WITH DIFFERENTIAL/PLATELET - Abnormal; Notable for the following components:      Result Value   Hemoglobin 11.8 (*)    Eosinophils Absolute 0.9 (*)    All other components within normal limits  BASIC METABOLIC PANEL - Abnormal; Notable for the following components:   Glucose, Bld 159 (*)    All other components within normal limits  TROPONIN I    EKG EKG Interpretation  Date/Time:  Friday Oct 11 2017 18:29:05 EDT Ventricular Rate:  76 PR Interval:    QRS Duration: 180 QT Interval:  449 QTC Calculation: 505 R Axis:   76 Text Interpretation:  Sinus rhythm Left  bundle branch block No significant change since last tracing Confirmed by Virgel Manifold [phone]) on 10/11/2017 6:45:00 PM   Radiology Dg Chest 2 View  Result Date: 10/11/2017 CLINICAL DATA:  Dyspnea. COPD. EXAM: CHEST - 2 VIEW COMPARISON:  08/18/2017 FINDINGS: The cardiac silhouette remains enlarged with a coronary artery stent noted. The lungs are hyperinflated without evidence of airspace consolidation, edema, pleural effusion, pneumothorax. Prior left shoulder arthroplasty is noted. IMPRESSION: COPD and cardiomegaly without evidence of acute cardiopulmonary process. Electronically Signed   By: Logan Bores M.D.   On: 10/11/2017 19:15    Procedures Procedures (including critical care time)  Medications Ordered in ED Medications  ipratropium-albuterol (DUONEB) 0.5-2.5 (3) MG/3ML nebulizer solution 3 mL (3 mLs Nebulization Given 10/11/17 1910)  albuterol (PROVENTIL) (2.5 MG/3ML) 0.083% nebulizer solution (2.5 mg  Given 10/11/17 1917)     Initial Impression / Assessment and Plan / ED Course  I have reviewed the triage vital signs and the nursing notes.  Pertinent labs & imaging results that were available during my care of the patient were reviewed by me and considered in my medical decision making (see chart for details).     83 year old female with what is likely mild COPD exacerbation.  She seems reasonably comfortable saturations are good on her baseline oxygen requirement.  She reports improvement after nebs in the ER.  She is afebrile.  She denies any chest pain.  Labs and x-ray report were reviewed.  Steroids.  Outpatient follow-up.  Final Clinical Impressions(s) / ED Diagnoses   Final diagnoses:  COPD exacerbation [facility])    ED Discharge Orders        Ordered    predniSONE (DELTASONE) 20 MG tablet  Daily     10/11/17 Micheline Chapman, MD 10/13/17 [phone]

## 2017-10-29 DIAGNOSIS — N390 Urinary tract infection, site not specified: Secondary | ICD-10-CM | POA: Diagnosis not present

## 2017-11-11 ENCOUNTER — Emergency Department (HOSPITAL_COMMUNITY): Payer: Medicare Other

## 2017-11-11 ENCOUNTER — Other Ambulatory Visit: Payer: Self-pay

## 2017-11-11 ENCOUNTER — Emergency Department (HOSPITAL_COMMUNITY)
Admission: EM | Admit: 2017-11-11 | Discharge: 2017-11-11 | Disposition: A | Discharge status: Home / Self Care | Payer: Medicare Other | Attending: Emergency Medicine | Admitting: Emergency Medicine

## 2017-11-11 ENCOUNTER — Encounter (HOSPITAL_COMMUNITY): Payer: Self-pay

## 2017-11-11 DIAGNOSIS — R072 Precordial pain: Secondary | ICD-10-CM | POA: Diagnosis not present

## 2017-11-11 DIAGNOSIS — I110 Hypertensive heart disease with heart failure: Secondary | ICD-10-CM | POA: Insufficient documentation

## 2017-11-11 DIAGNOSIS — Z79899 Other long term (current) drug therapy: Secondary | ICD-10-CM | POA: Diagnosis not present

## 2017-11-11 DIAGNOSIS — I447 Left bundle-branch block, unspecified: Secondary | ICD-10-CM | POA: Diagnosis not present

## 2017-11-11 DIAGNOSIS — I712 Thoracic aortic aneurysm, without rupture: Secondary | ICD-10-CM | POA: Diagnosis not present

## 2017-11-11 DIAGNOSIS — Z87891 Personal history of nicotine dependence: Secondary | ICD-10-CM | POA: Diagnosis not present

## 2017-11-11 DIAGNOSIS — I959 Hypotension, unspecified: Secondary | ICD-10-CM | POA: Diagnosis not present

## 2017-11-11 DIAGNOSIS — Z7982 Long term (current) use of aspirin: Secondary | ICD-10-CM | POA: Diagnosis not present

## 2017-11-11 DIAGNOSIS — R079 Chest pain, unspecified: Secondary | ICD-10-CM | POA: Diagnosis not present

## 2017-11-11 DIAGNOSIS — R55 Syncope and collapse: Secondary | ICD-10-CM | POA: Diagnosis not present

## 2017-11-11 DIAGNOSIS — R0602 Shortness of breath: Secondary | ICD-10-CM | POA: Diagnosis not present

## 2017-11-11 DIAGNOSIS — J449 Chronic obstructive pulmonary disease, unspecified: Secondary | ICD-10-CM | POA: Diagnosis not present

## 2017-11-11 DIAGNOSIS — I5022 Chronic systolic (congestive) heart failure: Secondary | ICD-10-CM | POA: Insufficient documentation

## 2017-11-11 DIAGNOSIS — R109 Unspecified abdominal pain: Secondary | ICD-10-CM | POA: Diagnosis not present

## 2017-11-11 DIAGNOSIS — R609 Edema, unspecified: Secondary | ICD-10-CM | POA: Diagnosis not present

## 2017-11-11 DIAGNOSIS — R0789 Other chest pain: Secondary | ICD-10-CM | POA: Diagnosis not present

## 2017-11-11 LAB — CBC
HCT: 38.2 % (ref 36.0–46.0)
Hemoglobin: 11.8 g/dL — ABNORMAL LOW (ref 12.0–15.0)
MCH: 26 pg (ref 26.0–34.0)
MCHC: 30.9 g/dL (ref 30.0–36.0)
MCV: 84.3 fL (ref 78.0–100.0)
Platelets: 325 10*3/uL (ref 150–400)
RBC: 4.53 MIL/uL (ref 3.87–5.11)
RDW: 14 % (ref 11.5–15.5)
WBC: 7.2 10*3/uL (ref 4.0–10.5)

## 2017-11-11 LAB — HEPATIC FUNCTION PANEL
ALT: 10 U/L (ref 0–44)
AST: 18 U/L (ref 15–41)
Albumin: 3.4 g/dL — ABNORMAL LOW (ref 3.5–5.0)
Alkaline Phosphatase: 50 U/L (ref 38–126)
Bilirubin, Direct: 0.2 mg/dL (ref 0.0–0.2)
Indirect Bilirubin: 0.5 mg/dL (ref 0.3–0.9)
Total Bilirubin: 0.7 mg/dL (ref 0.3–1.2)
Total Protein: 6 g/dL — ABNORMAL LOW (ref 6.5–8.1)

## 2017-11-11 LAB — URINALYSIS, ROUTINE W REFLEX MICROSCOPIC
Bilirubin Urine: NEGATIVE
Glucose, UA: NEGATIVE mg/dL
Hgb urine dipstick: NEGATIVE
Ketones, ur: NEGATIVE mg/dL
Nitrite: NEGATIVE
Protein, ur: NEGATIVE mg/dL
Specific Gravity, Urine: 1.006 (ref 1.005–1.030)
pH: 6 (ref 5.0–8.0)

## 2017-11-11 LAB — BASIC METABOLIC PANEL
Anion gap: 8 (ref 5–15)
BUN: 12 mg/dL (ref 8–23)
CO2: 29 mmol/L (ref 22–32)
Calcium: 9.7 mg/dL (ref 8.9–10.3)
Chloride: 106 mmol/L (ref 98–111)
Creatinine, Ser: 0.68 mg/dL (ref 0.44–1.00)
GFR calc Af Amer: >60 mL/min (ref >60)
GFR calc non Af Amer: >60 mL/min (ref >60)
Glucose, Bld: 108 mg/dL — ABNORMAL HIGH (ref 70–99)
Potassium: 4.4 mmol/L (ref 3.5–5.1)
Sodium: 143 mmol/L (ref 135–145)

## 2017-11-11 LAB — PROTIME-INR
INR: 1.02
Prothrombin Time: 13.3 seconds (ref 11.4–15.2)

## 2017-11-11 LAB — BRAIN NATRIURETIC PEPTIDE: B Natriuretic Peptide: 205.8 pg/mL — ABNORMAL HIGH (ref 0.0–100.0)

## 2017-11-11 LAB — I-STAT TROPONIN, ED
Troponin i, poc: 0 ng/mL (ref 0.00–0.08)
Troponin i, poc: 0 ng/mL (ref 0.00–0.08)

## 2017-11-11 LAB — LIPASE, BLOOD: Lipase: 26 U/L (ref 11–51)

## 2017-11-11 LAB — MAGNESIUM: Magnesium: 2 mg/dL (ref 1.7–2.4)

## 2017-11-11 MED ORDER — IOPAMIDOL (ISOVUE-370) INJECTION 76%
INTRAVENOUS | Status: AC
  Filled 2017-11-11: qty 100

## 2017-11-11 MED ORDER — IOPAMIDOL (ISOVUE-370) INJECTION 76%
100.0000 mL | Freq: Once | INTRAVENOUS | Status: AC | PRN
  Administered 2017-11-11: 100 mL via INTRAVENOUS

## 2017-11-11 NOTE — ED Notes (Signed)
Pt alert and oriented in NAD. Pt verbalized understanding of discharge instructions. Pt daughter [phone] will pick her up from lobby.

## 2017-11-11 NOTE — ED Notes (Signed)
[employer] paged to Dr. Tegeler per his request 

## 2017-11-11 NOTE — ED Provider Notes (Signed)
[facility] EMERGENCY DEPARTMENT Provider Note   CSN: [id number] Arrival date & time: 11/11/17  1011     History   Chief Complaint Chief Complaint  Patient presents with  . Chest Pain    HPI [name] is a 81 y.o. female.  The history is provided by the patient and medical records.  Chest Pain   This is a recurrent problem. The current episode started 1 to 2 hours ago. The problem occurs constantly. The problem has been resolved. The pain is present in the substernal region. The pain is moderate. The quality of the pain is described as heavy and pressure-like. The pain radiates to the upper back. The symptoms are aggravated by exertion. Associated symptoms include abdominal pain, diaphoresis, malaise/fatigue, nausea, near-syncope and shortness of breath. Pertinent negatives include no back pain, no cough, no fever, no headaches, no numbness, no palpitations and no vomiting. She has tried nothing for the symptoms. The treatment provided no relief.    Past Medical History:  Diagnosis Date  . Anxiety   . Cervical disc disorder with myelopathy, unspecified cervical region   . Chronic systolic (congestive) heart failure ([place])   . COPD (chronic obstructive pulmonary disease) ([place])   . Essential hypertension   . Hemorrhoids   . Liver mass   . Lung, cysts, congenital    Left lung cyst  . Myocardial infarction ([place])   . Nephrolithiasis    Embedded  . Nonischemic cardiomyopathy ([place])    LVEF 35-40% 2015  . On home O2   . Osteoarthritis   . Thoracic ascending aortic aneurysm ([facility])    4.3 cm April 2016    Patient Active Problem List   Diagnosis Date Noted  . Dizziness 04/01/2017  . COPD (chronic obstructive pulmonary disease) ([place]) 04/01/2017  . CAD (coronary artery disease) 04/01/2017  . NSTEMI (non-ST elevated myocardial infarction) ([place]) 11/23/2016  . Hypertensive cardiovascular disease 10/03/2015  . Protein-calorie malnutrition, severe 07/13/2015  .  Demand ischemia of myocardium ([place]) 07/13/2015  . Nonischemic cardiomyopathy ([place])   . Atypical chest pain   . Left bundle branch block   . COPD exacerbation ([place]) 07/11/2015  . Flu-like symptoms 07/11/2015  . Elevated troponin 07/11/2015  . Acute bronchitis 07/11/2015  . GERD (gastroesophageal reflux disease) 07/11/2015  . Bronchospasm   . Ascending aortic aneurysm ([place]) 01/27/2015  . Chronic combined systolic and diastolic CHF (congestive heart failure) ([place]) 01/27/2015  . Chest pain at rest 01/27/2015  . Essential hypertension 01/27/2015  . Anxiety 01/27/2015  . Chronic pain 01/27/2015  . Chest pain 08/20/2013  . Hypertension 08/20/2013  . Contusion of left knee 08/07/2012  . Sprain of wrist 08/07/2012  . Liver mass 05/21/2011  . Aneurysm ([place]) 05/21/2011  . Abdominal pain 05/21/2011  . Bronchitis 05/21/2011  . De Quervain's disease (tenosynovitis) 04/02/2011  . SHOULDER PAIN 01/19/2009  . CERVICALGIA 01/19/2009  . IMPINGEMENT SYNDROME 01/19/2009    Past Surgical History:  Procedure Laterality Date  . Benign breast tumors    . CHOLECYSTECTOMY    . COLONOSCOPY    . COLONOSCOPY N/A 09/22/2014   Procedure: COLONOSCOPY;  Surgeon: Rogene Houston, MD;  Location: [facility];  Service: Endoscopy;  Laterality: N/A;  830 -- to be done in OR under fluoro  . Complete hysterectomy    . CORONARY STENT INTERVENTION N/A 11/23/2016   Procedure: Coronary Stent Intervention;  Surgeon: Martinique, Peter M, MD;  Location: [facility];  Service: Cardiovascular;  Laterality: N/A;  .  LEFT HEART CATH AND CORONARY ANGIOGRAPHY N/A 11/23/2016   Procedure: Left Heart Cath and Coronary Angiography;  Surgeon: Martinique, Peter M, MD;  Location: [facility];  Service: Cardiovascular;  Laterality: N/A;  . TONSILLECTOMY AND ADENOIDECTOMY       OB History   None      Home Medications    Prior to Admission medications   Medication Sig Start Date End Date Taking? Authorizing Provider    albuterol (PROVENTIL) (2.5 MG/3ML) 0.083% nebulizer solution Take 3 mLs (2.5 mg total) by nebulization every 6 (six) hours as needed for wheezing or shortness of breath. 07/13/15   Sinda Du, MD  aspirin EC 81 MG tablet Take 81 mg by mouth every morning.     [provider]  atorvastatin (LIPITOR) 80 MG tablet Take 1 tablet (80 mg total) by mouth daily at 6 PM. 11/24/16   Bhagat, Bhavinkumar, PA  carvedilol (COREG) 6.25 MG tablet TAKE ONE TABLET BY MOUTH 2 TIMES A DAY. 04/16/17   Herminio Commons, MD  clorazepate (TRANXENE) 7.5 MG tablet Take 7.5 mg by mouth daily as needed for anxiety. For nerves    [provider]  meclizine (ANTIVERT) 25 MG tablet Take 1 or 2  Every 6 hours Patient taking differently: Take 12.5-25 mg by mouth 4 (four) times daily as needed for dizziness.  04/03/17   Sinda Du, MD  montelukast (SINGULAIR) 10 MG tablet Take 1 tablet (10 mg total) by mouth at bedtime. Patient not taking: Reported on 10/11/2017 08/18/17 09/17/17  Noemi Chapel, MD  naproxen (NAPROSYN) 500 MG tablet Take 500 mg by mouth 2 (two) times daily with a meal.    [provider]  nitroGLYCERIN (NITROSTAT) 0.4 MG SL tablet Place 1 tablet (0.4 mg total) under the tongue every 5 (five) minutes x 3 doses as needed for chest pain. 11/24/16   Bhagat, Crista Luria, PA  OXYGEN Inhale 2 L into the lungs every evening.    [provider]  prasugrel (EFFIENT) 10 MG TABS tablet Take 10 mg by mouth daily.    [provider]  predniSONE (DELTASONE) 20 MG tablet Take 2 tablets (40 mg total) by mouth daily. 10/11/17   Virgel Manifold, MD  PROAIR HFA 108 (90 BASE) MCG/ACT inhaler Inhale 2 puffs into the lungs every 6 (six) hours as needed for wheezing or shortness of breath.  02/08/14   [provider]  sacubitril-valsartan (ENTRESTO) 24-26 MG Take 1 tablet by mouth 2 (two) times daily. Patient taking differently: Take 1 tablet by mouth daily.  08/29/16   Herminio Commons, MD  SYMBICORT 160-4.5 MCG/ACT inhaler Inhale 2 puffs into the lungs 2 (two) times daily. 09/23/17   [provider]  traMADol (ULTRAM) 50 MG tablet Take 50 mg by mouth daily as needed for moderate pain or severe pain. Maximum dose= 8 tablets per day    [provider]    Family History Family History  Problem Relation Age of Onset  . Heart disease Mother   . Aneurysm Father   . Lung cancer Brother   . Heart disease Sister   . Diabetes Brother   . Heart disease Brother     Social History Social History   Tobacco Use  . Smoking status: Former Smoker    Packs/day: 0.25    Years: 31.00    Pack years: 7.75    Types: Cigarettes    Start date: 05/14/1977    Last attempt to quit: 05/28/2008  Years since quitting: 9.4  . Smokeless tobacco: Never Used  Substance Use Topics  . Alcohol use: No    Alcohol/week: 0.0 oz  . Drug use: No     Allergies   Plavix [clopidogrel bisulfate]; Montelukast sodium; Alprazolam; Codeine; Percodan [oxycodone-aspirin]; and Valium   Review of Systems Review of Systems  Constitutional: Positive for diaphoresis, fatigue and malaise/fatigue. Negative for fever.  HENT: Negative for congestion.   Respiratory: Positive for chest tightness and shortness of breath. Negative for cough, wheezing and stridor.   Cardiovascular: Positive for chest pain and near-syncope. Negative for palpitations.  Gastrointestinal: Positive for abdominal pain and nausea. Negative for constipation, diarrhea and vomiting.  Genitourinary: Negative for dysuria, flank pain and frequency.  Musculoskeletal: Negative for back pain, neck pain and neck stiffness.  Neurological: Positive for light-headedness. Negative for syncope, numbness and headaches.  Psychiatric/Behavioral: Negative for agitation and confusion.  All other systems reviewed and are negative.    Physical Exam Updated Vital Signs BP 132/61   Pulse 65   Temp 98 F (36.7 C) (Oral)   Resp  15   Ht 5\' 2"  (1.575 m)   Wt 79.8 kg (176 lb)   SpO2 99%   BMI 32.19 kg/m   Physical Exam  Constitutional: She is oriented to person, place, and time. She appears well-developed and well-nourished.  Non-toxic appearance. She does not appear ill. No distress.  HENT:  Head: Normocephalic and atraumatic.  Mouth/Throat: Oropharynx is clear and moist.  Eyes: Pupils are equal, round, and reactive to light. Conjunctivae and EOM are normal.  Neck: Normal range of motion. Neck supple.  Cardiovascular: Normal rate, regular rhythm and normal pulses.  Murmur heard.  Systolic murmur is present. Pulmonary/Chest: Effort normal and breath sounds normal. No respiratory distress. She has no wheezes. She has no rales. She exhibits no tenderness.  Abdominal: Soft. There is tenderness. There is no guarding.  Musculoskeletal: She exhibits no edema or tenderness.  Neurological: She is alert and oriented to person, place, and time. No sensory deficit. She exhibits normal muscle tone.  Skin: Skin is warm and dry. Capillary refill takes less than 2 seconds. No rash noted. She is not diaphoretic. No erythema.  Psychiatric: She has a normal mood and affect.  Nursing note and vitals reviewed.    ED Treatments / Results  Labs (all labs ordered are listed, but only abnormal results are displayed) Labs Reviewed  BASIC METABOLIC PANEL - Abnormal; Notable for the following components:      Result Value   Glucose, Bld 108 (*)    All other components within normal limits  CBC - Abnormal; Notable for the following components:   Hemoglobin 11.8 (*)    All other components within normal limits  HEPATIC FUNCTION PANEL - Abnormal; Notable for the following components:   Total Protein 6.0 (*)    Albumin 3.4 (*)    All other components within normal limits  BRAIN NATRIURETIC PEPTIDE - Abnormal; Notable for the following components:   B Natriuretic Peptide 205.8 (*)    All other components within normal limits    URINALYSIS, ROUTINE W REFLEX MICROSCOPIC - Abnormal; Notable for the following components:   Color, Urine STRAW (*)    Leukocytes, UA TRACE (*)    Bacteria, UA RARE (*)    All other components within normal limits  URINE CULTURE  PROTIME-INR  LIPASE, BLOOD  MAGNESIUM  I-STAT TROPONIN, ED  I-STAT TROPONIN, ED    EKG EKG Interpretation  Date/Time:  Monday November 11 2017 10:14:58 EDT Ventricular Rate:  71 PR Interval:    QRS Duration: 169 QT Interval:  434 QTC Calculation: 472 R Axis:   8 Text Interpretation:  Sinus rhythm Left bundle branch block When compared to prior ECG in april, similar t wave inversions with LBBB.  No STEMI Confirmed by Antony Blackbird [phone]) on 11/11/2017 1:45:31 PM   Radiology Dg Chest 2 View  Result Date: 11/11/2017 CLINICAL DATA:  Chest pain radiating into the abdomen awakened the patient around 8 a.m. today. Symptoms associated with mild shortness of breath. History of previous MI, COPD. EXAM: CHEST - 2 VIEW COMPARISON:  PA and lateral chest x-ray of Oct 11, 2017 FINDINGS: The lungs are mildly hyperinflated. There is no focal infiltrate. The interstitial markings in the left lower lung are mildly increased. The cardiac silhouette is enlarged but stable. The pulmonary vascularity is normal. There is tortuosity of the ascending and descending thoracic aorta. There is moderate multilevel degenerative disc disease of the thoracic spine. IMPRESSION: COPD. Minimal interstitial prominence at the left lung base likely reflects subsegmental atelectasis. Mild cardiomegaly without pulmonary vascular congestion or pulmonary edema. No acute pneumonia. Thoracic aortic atherosclerosis. Electronically Signed   By: David  Martinique M.D.   On: 11/11/2017 10:58   Ct Angio Chest/abd/pel For Dissection W And/or Wo Contrast  Result Date: 11/11/2017 CLINICAL DATA:  Bradycardia and hypoxia, history of thoracic aortic aneurysm EXAM: CT ANGIOGRAPHY CHEST, ABDOMEN AND PELVIS TECHNIQUE:  Multidetector CT imaging through the chest, abdomen and pelvis was performed using the standard protocol during bolus administration of intravenous contrast. Multiplanar reconstructed images and MIPs were obtained and reviewed to evaluate the vascular anatomy. CONTRAST:  145mL ISOVUE-370 IOPAMIDOL (ISOVUE-370) INJECTION 76% COMPARISON:  09/03/2016 FINDINGS: CTA CHEST FINDINGS Cardiovascular: There is again noted dilatation of the ascending aorta with distal tapering. Maximum dimension of the ascending aorta is 4.2 cm similar to that seen on the prior exam. The sino-tubular junction measures 3.2 cm also stable from the prior study. Mild atherosclerotic calcifications are noted. No signs of dissection are seen. The pulmonary artery as visualized is within normal limits without evidence of pulmonary embolus. Some narrowing of the left innominate vein is noted with considerable collateralization via the superior intercostal vein on the left as well as the azygos vein. These changes are stable from the prior exam. Mediastinum/Nodes: Esophagus is within normal limits. Thoracic inlet demonstrates evidence of a hypodense right thyroid nodule stable from the previous exam. No significant hilar or mediastinal adenopathy is noted. Lungs/Pleura: The lungs are well aerated bilaterally. Some minimal scarring is noted in the bases bilaterally. No focal infiltrate or sizable effusion is seen. No parenchymal nodules are noted. Musculoskeletal: Degenerative changes of the thoracic spine are noted. No acute bony abnormality is seen. Review of the MIP images confirms the above findings. CTA ABDOMEN AND PELVIS FINDINGS VASCULAR Aorta: Scattered atherosclerotic changes are noted without aneurysmal dilatation or dissection. Celiac: Patent without evidence of aneurysm, dissection, vasculitis or significant stenosis. SMA: Patent without evidence of aneurysm, dissection, vasculitis or significant stenosis. Renals: Both renal arteries are  patent without evidence of aneurysm, dissection, vasculitis, fibromuscular dysplasia or significant stenosis. IMA: Patent without evidence of aneurysm, dissection, vasculitis or significant stenosis. Iliacs: Atherosclerotic changes are noted without aneurysmal dilatation or focal dissection. The femoral artery and femoral bifurcations are within normal limits bilaterally. Veins: No of ane abnormality is noted. Review of the MIP images confirms the above findings. NON-VASCULAR Hepatobiliary: Fatty infiltration of the liver is seen.  The gallbladder has been surgically removed. A enhancing lesion is noted in the posterior aspect of the right lobe of the liver stable from the previous exam consistent with hemangioma. It measures approximately 3.7 cm. Pancreas: Unremarkable. No pancreatic ductal dilatation or surrounding inflammatory changes. Spleen: Normal in size without focal abnormality. Adrenals/Urinary Tract: Adrenal glands are within normal limits bilaterally. Renal cysts are noted on the left with the largest measuring approximately 4.6 cm in transverse dimension. No renal calculi or obstructive changes are seen. The bladder is well visualized and within normal limits. Stomach/Bowel: Diverticular change of the colon is seen. No evidence of diverticulitis is noted. The appendix is not well visualized although no inflammatory changes to suggest appendicitis are seen. No obstructive changes are noted. Lymphatic: No significant lymphadenopathy is seen. Reproductive: Status post hysterectomy. No adnexal masses. Other: No abdominal wall hernia or abnormality. No abdominopelvic ascites. Musculoskeletal: Degenerative changes of lumbar spine are noted. Review of the MIP images confirms the above findings. IMPRESSION: CTA of the chest: Stable aneurysmal dilatation of the ascending thoracic aorta. No evidence of dissection is noted. No evidence of pulmonary emboli. CTA of the abdomen and pelvis. Scattered atherosclerotic  changes without acute arterial abnormality. Chronic changes in the abdomen are seen. Electronically Signed   By: Inez Catalina M.D.   On: 11/11/2017 13:49    Procedures Procedures (including critical care time)  Medications Ordered in ED Medications  iopamidol (ISOVUE-370) 76 % injection 100 mL (100 mLs Intravenous Contrast Given 11/11/17 1243)     Initial Impression / Assessment and Plan / ED Course  I have reviewed the triage vital signs and the nursing notes.  Pertinent labs & imaging results that were available during my care of the patient were reviewed by me and considered in my medical decision making (see chart for details).     Harlynn Kimbell Albee is a 82 y.o. female with a past medical history significant for known thoracic aortic aneurysm, CAD with MI last year status post PCI x2, COPD on intermittent home oxygen, hypertension, liver mass, and CHF who presents with near syncope, shortness of breath, and severe chest pain.  Patient reports that this morning she woke up and was having severe chest pain.  She reports it was both a pressure and a sharp pain that radiated straight through to her back into her upper abdomen.  She reports that when she sat up she was very lightheaded and when she checked her pulse ox her heart rate was in the 40s and her oxygen saturation was 82%.  She reports pain in the oxygen back on her nose and laid back down.  She did not pass out but reports her chest pain continued.  She reports it was severe this morning.  She described it as a 10 out of 10 in severity and sharp and crushing.  She is unsure if this feels like when she had a heart attack last year or when she was found to have her aneurysm.  She reports no new leg edema, fevers, or chills.  She does report a chronic cough that has had some productive sputum.  She denies nausea, vomiting, or diaphoresis.  She denies recent trauma.  On exam, patient had a murmur.  Patient's lungs were slightly coarse.   Patient's chest was nontender and I did not reproduce her pain.  Abdomen was slightly tender in the epigastrium.  Patient had symmetric pulses in lower extreme knees and had normal sensation and strength in both legs.  Patient had symmetric pulses in arms.  Patient was alert and oriented and was chest pain-free on my examination.  Next  Patient reports that she received aspirin with EMS and her pain has improved.  Patient's EKG appears similar to prior with known bundle branch block with T wave inversions.  No significant changes when compared to prior in April.  Patient will have work-up including chest x-ray laboratory testing and will also have a CT dissection study to further investigate the aneurysm.  Given the patient's concerning chest pain, bradycardia and hypoxia, patient will likely require admission for high risk chest pain.    Anticipate reassessment after work-up.  5:22 PM Patient's work-up was overall reassuring.  Patient's troponin was negative x2.  Urinalysis showed leukocytes and bacteria however she assured me she was not having any urinary symptoms.  Culture will be sent.  Metabolic panel showed reassuring letter lites and normal kidney function.  CBC showed mild anemia but no leukocytosis.  BNP is improved from prior.  Magnesium normal.  Lipase not elevated.  CT scan shows stable CTA of the chest with no changes in the aortic aneurysm and no evidence of dissection.  No evidence of PE.  Lungs are well aerated with slight scarring.  Cardiology came to the patient and with the second negative troponin, they felt patient was stable for discharge home.  They suspected the patient's low heart rate on her monitor was actually due to PVCs that it was not picking up.  This was witnessed with cardiology in the room on telemetry.    Patient will follow-up with her PCP and cardiologist for further management.   Patient discharged in good condition with improved symptoms and no further  chest pain.  Final Clinical Impressions(s) / ED Diagnoses   Final diagnoses:  Nonspecific chest pain  Precordial pain    ED Discharge Orders    None     Clinical Impression: 1. Nonspecific chest pain   2. Precordial pain     Disposition: Discharge  Condition: Good  I have discussed the results, Dx and Tx plan with the pt(& family if present). He/she/they expressed understanding and agree(s) with the plan. Discharge instructions discussed at great length. Strict return precautions discussed and pt &/or family have verbalized understanding of the instructions. No further questions at time of discharge.    New Prescriptions   No medications on file    Follow Up: [facility] [address] [address] [phone] Follow up on 11/25/2017 Please arrive 15 minutes early for your 3:30 pm Cardiology appointment     Tegeler, Gwenyth Allegra, MD 11/11/17 1729

## 2017-11-11 NOTE — Consult Note (Signed)
Cardiology Consultation:   Patient ID: [name]; [id number]; Nov 18, 1935   Admit date: 11/11/2017 Date of Consult: 11/11/2017  Primary Care Provider: Sinda Du, MD Primary Cardiologist: Kate Sable, MD Primary Electrophysiologist:  None   Patient Profile:   [name] is a 82 y.o. female with a PMH of CAD s/p NSTEMI 2018 with PCI to LAD and OM1, HTN, chronic combined CHF, Thoracic aortic aneurysm, chronic LBBB, COPD with intermittent home O2,  who is being seen today for the evaluation of chest pain at the request of Dr. Sherry Ruffing.  History of Present Illness:   [name] was in her usual state of health until the morning when she noticed acute onset lightheadedness shortly after getting out of bed. She states she immediately sat back down and her pulse-ox monitor reported an O2 sat around 82% and HR in the 40s. She reported a couple fleeting pains that moved across her chest from the center to under her left breast which lasted for seconds and resolved spontaneously. She states her symptoms did not feel similar to her MI in 2018 at which time she reported severe chest pressure radiating across her whole chest and diaphoresis. She eventually got up and took her medications but continued to feel lightheaded and called EMS.   She was last seen outpatient by Dr. Bronson Ing 08/2017 and was without anginal complaints at that time. She has chronic SOB and was noted to be tachypneic with an expiratory wheeze on exam. She was recommended to continue her current cardiac regimen. She was given an RX for 5d course of prednisone for possible COPD exacerbation and instructed to follow-up with her Pulmonologist. Her last echo was 11/2016 with EF 40-45%, G1DD, diffuse hypokinesis, and moderate aortic regurg. Her last ischemic evaluation was a LHC 11/2016 with 2 vessel obstructive CAD with PCI/DES to LAD and OM1; recommended for DAPT x1 year.   At the time of this evaluation she is chest pain  free. She has multiple chronic complaints and is quite tangential on history taking. She reports chronic SOB, DOE, intermittent atypical chest pain, L shoulder/neck pain, and dizziness. She denies recent changes in any of her chronic complaints. She denies orthopnea, PND, or LE edema.   ED course: Intermittently hypertensive, otherwise VSS. Labs notable for electrolytes wnl, Cr 0.68, CBC 11.8, PLT 325, Trop 0.00 x1, BNP 205. EKG with chronic LBBB. CXR without acute findings. CTA C/A/P with stable aneurysmal dilation of ascending thoracic aorta without dissection and no PE. Cardiology asked to evaluate for chest pain recommendations.   Past Medical History:  Diagnosis Date  . Anxiety   . Cervical disc disorder with myelopathy, unspecified cervical region   . Chronic systolic (congestive) heart failure ([place])   . COPD (chronic obstructive pulmonary disease) ([place])   . Essential hypertension   . Hemorrhoids   . Liver mass   . Lung, cysts, congenital    Left lung cyst  . Myocardial infarction ([place])   . Nephrolithiasis    Embedded  . Nonischemic cardiomyopathy ([place])    LVEF 35-40% 2015  . On home O2   . Osteoarthritis   . Thoracic ascending aortic aneurysm ([facility])    4.3 cm April 2016    Past Surgical History:  Procedure Laterality Date  . Benign breast tumors    . CHOLECYSTECTOMY    . COLONOSCOPY    . COLONOSCOPY N/A 09/22/2014   Procedure: COLONOSCOPY;  Surgeon: Rogene Houston, MD;  Location: [facility];  Service: Endoscopy;  Laterality: N/A;  830 -- to be done in OR under fluoro  . Complete hysterectomy    . CORONARY STENT INTERVENTION N/A 11/23/2016   Procedure: Coronary Stent Intervention;  Surgeon: Martinique, Peter M, MD;  Location: [facility];  Service: Cardiovascular;  Laterality: N/A;  . LEFT HEART CATH AND CORONARY ANGIOGRAPHY N/A 11/23/2016   Procedure: Left Heart Cath and Coronary Angiography;  Surgeon: Martinique, Peter M, MD;  Location: [facility];  Service:  Cardiovascular;  Laterality: N/A;  . TONSILLECTOMY AND ADENOIDECTOMY       Home Medications:  Prior to Admission medications   Medication Sig Start Date End Date Taking? Authorizing Provider  albuterol (PROVENTIL) (2.5 MG/3ML) 0.083% nebulizer solution Take 3 mLs (2.5 mg total) by nebulization every 6 (six) hours as needed for wheezing or shortness of breath. 07/13/15  Yes Sinda Du, MD  aspirin EC 81 MG tablet Take 81 mg by mouth every morning.    Yes [provider]  atorvastatin (LIPITOR) 80 MG tablet Take 1 tablet (80 mg total) by mouth daily at 6 PM. 11/24/16  Yes Bhagat, Bhavinkumar, PA  carvedilol (COREG) 6.25 MG tablet TAKE ONE TABLET BY MOUTH 2 TIMES A DAY. 04/16/17  Yes Herminio Commons, MD  ciprofloxacin (CIPRO) 250 MG tablet Take 250 mg by mouth 2 (two) times daily. 11/08/17  Yes [provider]  clorazepate (TRANXENE) 7.5 MG tablet Take 7.5 mg by mouth daily as needed for anxiety. For nerves   Yes [provider]  meclizine (ANTIVERT) 25 MG tablet Take 1 or 2  Every 6 hours Patient taking differently: Take 12.5-25 mg by mouth 4 (four) times daily as needed for dizziness.  04/03/17  Yes Sinda Du, MD  naproxen (NAPROSYN) 500 MG tablet Take 500 mg by mouth 2 (two) times daily with a meal.   Yes [provider]  nitroGLYCERIN (NITROSTAT) 0.4 MG SL tablet Place 1 tablet (0.4 mg total) under the tongue every 5 (five) minutes x 3 doses as needed for chest pain. 11/24/16  Yes Bhagat, Bhavinkumar, PA  OXYGEN Inhale 2 L into the lungs every evening.   Yes [provider]  prasugrel (EFFIENT) 10 MG TABS tablet Take 10 mg by mouth daily.   Yes [provider]  PROAIR HFA 108 (90 BASE) MCG/ACT inhaler Inhale 2 puffs into the lungs every 6 (six) hours as needed for wheezing or shortness of breath.  02/08/14  Yes [provider]  sacubitril-valsartan (ENTRESTO) 24-26 MG Take 1 tablet by mouth 2 (two) times daily. Patient  taking differently: Take 1 tablet by mouth daily.  08/29/16  Yes Herminio Commons, MD  SYMBICORT 160-4.5 MCG/ACT inhaler Inhale 2 puffs into the lungs 2 (two) times daily. 09/23/17  Yes [provider]  traMADol (ULTRAM) 50 MG tablet Take 50 mg by mouth daily as needed for moderate pain or severe pain. Maximum dose= 8 tablets per day   Yes [provider]  montelukast (SINGULAIR) 10 MG tablet Take 1 tablet (10 mg total) by mouth at bedtime. Patient not taking: Reported on 10/11/2017 08/18/17 09/17/17  Noemi Chapel, MD  predniSONE (DELTASONE) 20 MG tablet Take 2 tablets (40 mg total) by mouth daily. Patient not taking: Reported on 11/11/2017 10/11/17   Virgel Manifold, MD    Inpatient Medications: Scheduled Meds:  Continuous Infusions:  PRN Meds:   Allergies:    Allergies  Allergen Reactions  . Plavix [Clopidogrel Bisulfate] Itching    Severe itching  .  Montelukast Sodium     Pt states she felt a "headache and her eyes started to Burn "   . Alprazolam Nausea And Vomiting  . Codeine Nausea And Vomiting  . Percodan [Oxycodone-Aspirin] Nausea And Vomiting  . Valium Nausea And Vomiting    Social History:   Social History   Socioeconomic History  . Marital status: Widowed    Spouse name: Not on file  . Number of children: Not on file  . Years of education: 9th  . Highest education level: Not on file  Occupational History    Employer: RETIRED  Social Needs  . Financial resource strain: Not on file  . Food insecurity:    Worry: Not on file    Inability: Not on file  . Transportation needs:    Medical: Not on file    Non-medical: Not on file  Tobacco Use  . Smoking status: Former Smoker    Packs/day: 0.25    Years: 31.00    Pack years: 7.75    Types: Cigarettes    Start date: 05/14/1977    Last attempt to quit: 05/28/2008    Years since quitting: 9.4  . Smokeless tobacco: Never Used  Substance and Sexual Activity  . Alcohol use: No    Alcohol/week: 0.0 oz    . Drug use: No  . Sexual activity: Never  Lifestyle  . Physical activity:    Days per week: Not on file    Minutes per session: Not on file  . Stress: Not on file  Relationships  . Social connections:    Talks on phone: Not on file    Gets together: Not on file    Attends religious service: Not on file    Active member of club or organization: Not on file    Attends meetings of clubs or organizations: Not on file    Relationship status: Not on file  . Intimate partner violence:    Fear of current or ex partner: Not on file    Emotionally abused: Not on file    Physically abused: Not on file    Forced sexual activity: Not on file  Other Topics Concern  . Not on file  Social History Narrative  . Not on file    Family History:    Family History  Problem Relation Age of Onset  . Heart disease Mother   . Aneurysm Father   . Lung cancer Brother   . Heart disease Sister   . Diabetes Brother   . Heart disease Brother      ROS:  Please see the history of present illness.   All other ROS reviewed and negative.     Physical Exam/Data:   Vitals:   11/11/17 1230 11/11/17 1315 11/11/17 1330 11/11/17 1400  BP: (!) 160/77 140/62 121/61 (!) 144/67  Pulse: 79 75 71 69  Resp:  13 12 13   Temp:      TempSrc:      SpO2: 100% 100% 99% 99%  Weight:      Height:       No intake or output data in the 24 hours ending 11/11/17 1455 Filed Weights   11/11/17 1015  Weight: 176 lb (79.8 kg)   Body mass index is 32.19 kg/m.  General:  Obese elderly female laying in bed in no acute distress HEENT: sclera anicteric, dentures at bedside   Neck: no JVD Vascular: No carotid bruits; distal pulses 2+ bilaterally Cardiac:  normal S1, S2; RRR; no  murmurs, gallops, or rubs; chest wall with TTP Lungs:  clear to auscultation bilaterally, faint expiratory wheeze throughout, no rhonchi or rales  Abd: NABS, soft, obese, nontender, no hepatomegaly Ext: no edema Musculoskeletal:  No  deformities, BUE and BLE strength normal and equal Skin: warm and dry  Neuro:  CNs 2-12 intact, no focal abnormalities noted Psych:  Tangential  EKG:  The EKG was personally reviewed and demonstrates:  Sinus rhythm with chronic LBBB Telemetry:  Telemetry was personally reviewed and demonstrates:  Sinus rhythm with LBBB  Relevant CV Studies: Left Heart Catheterization 11/2016: Conclusion     LV end diastolic pressure is normal.  Mid Cx lesion, 30 %stenosed.  Prox RCA lesion, 20 %stenosed.  Prox LAD to Mid LAD lesion, 85 %stenosed.  A STENT PROMUS PREM MR 3.5X32 drug eluting stent was successfully placed.  Post intervention, there is a 0% residual stenosis.  1st Mrg lesion, 90 %stenosed.  A STENT PROMUS PREM MR 2.5X16 drug eluting stent was successfully placed.  Post intervention, there is a 0% residual stenosis.   1. 2 vessel obstructive CAD    - 85% segmental mid LAD    - 90% mid OM1.  2. Normal LVEDP 3. Successful stenting of the mid LAD with DES 4. Successful stenting of the mid OM1 with DES  Plan: DAPT for one year. Anticipate DC in am.     Echocardiogram 11/2016: Study Conclusions  - Left ventricle: The cavity size was normal. There was mild   concentric hypertrophy. Systolic function was mildly to   moderately reduced. The estimated ejection fraction was in the   range of 40% to 45%. Diffuse hypokinesis worse in the antero and   inferoseptum. Doppler parameters are consistent with abnormal   left ventricular relaxation (grade 1 diastolic dysfunction).   Doppler parameters are consistent with indeterminate ventricular   filling pressure. - Aortic valve: Transvalvular velocity was within the normal range.   There was no stenosis. There was moderate regurgitation. Valve   area (VTI): 1.6 cm^2. Valve area (Vmax): 1.6 cm^2. Valve area   (Vmean): 1.59 cm^2. - Mitral valve: Transvalvular velocity was within the normal range.   There was no evidence for  stenosis. There was trivial   regurgitation. - Right ventricle: The cavity size was normal. Wall thickness was   normal. Systolic function was normal. - Atrial septum: No defect or patent foramen ovale was identified   by color flow Doppler. - Tricuspid valve: There was no regurgitation.  Laboratory Data:  Chemistry Recent Labs  Lab 11/11/17 1026  NA 143  K 4.4  CL 106  CO2 29  GLUCOSE 108*  BUN 12  CREATININE 0.68  CALCIUM 9.7  GFRNONAA >60  GFRAA >60  ANIONGAP 8    Recent Labs  Lab 11/11/17 1026  PROT 6.0*  ALBUMIN 3.4*  AST 18  ALT 10  ALKPHOS 50  BILITOT 0.7   Hematology Recent Labs  Lab 11/11/17 1026  WBC 7.2  RBC 4.53  HGB 11.8*  HCT 38.2  MCV 84.3  MCH 26.0  MCHC 30.9  RDW 14.0  PLT 325   Cardiac EnzymesNo results for input(s): TROPONINI in the last 168 hours.  Recent Labs  Lab 11/11/17 1044  TROPIPOC 0.00    BNP Recent Labs  Lab 11/11/17 1026  BNP 205.8*    DDimer No results for input(s): DDIMER in the last 168 hours.  Radiology/Studies:  Dg Chest 2 View  Result Date: 11/11/2017 CLINICAL DATA:  Chest pain radiating  into the abdomen awakened the patient around 8 a.m. today. Symptoms associated with mild shortness of breath. History of previous MI, COPD. EXAM: CHEST - 2 VIEW COMPARISON:  PA and lateral chest x-ray of Oct 11, 2017 FINDINGS: The lungs are mildly hyperinflated. There is no focal infiltrate. The interstitial markings in the left lower lung are mildly increased. The cardiac silhouette is enlarged but stable. The pulmonary vascularity is normal. There is tortuosity of the ascending and descending thoracic aorta. There is moderate multilevel degenerative disc disease of the thoracic spine. IMPRESSION: COPD. Minimal interstitial prominence at the left lung base likely reflects subsegmental atelectasis. Mild cardiomegaly without pulmonary vascular congestion or pulmonary edema. No acute pneumonia. Thoracic aortic atherosclerosis.  Electronically Signed   By: David  Martinique M.D.   On: 11/11/2017 10:58   Ct Angio Chest/abd/pel For Dissection W And/or Wo Contrast  Result Date: 11/11/2017 CLINICAL DATA:  Bradycardia and hypoxia, history of thoracic aortic aneurysm EXAM: CT ANGIOGRAPHY CHEST, ABDOMEN AND PELVIS TECHNIQUE: Multidetector CT imaging through the chest, abdomen and pelvis was performed using the standard protocol during bolus administration of intravenous contrast. Multiplanar reconstructed images and MIPs were obtained and reviewed to evaluate the vascular anatomy. CONTRAST:  157mL ISOVUE-370 IOPAMIDOL (ISOVUE-370) INJECTION 76% COMPARISON:  09/03/2016 FINDINGS: CTA CHEST FINDINGS Cardiovascular: There is again noted dilatation of the ascending aorta with distal tapering. Maximum dimension of the ascending aorta is 4.2 cm similar to that seen on the prior exam. The sino-tubular junction measures 3.2 cm also stable from the prior study. Mild atherosclerotic calcifications are noted. No signs of dissection are seen. The pulmonary artery as visualized is within normal limits without evidence of pulmonary embolus. Some narrowing of the left innominate vein is noted with considerable collateralization via the superior intercostal vein on the left as well as the azygos vein. These changes are stable from the prior exam. Mediastinum/Nodes: Esophagus is within normal limits. Thoracic inlet demonstrates evidence of a hypodense right thyroid nodule stable from the previous exam. No significant hilar or mediastinal adenopathy is noted. Lungs/Pleura: The lungs are well aerated bilaterally. Some minimal scarring is noted in the bases bilaterally. No focal infiltrate or sizable effusion is seen. No parenchymal nodules are noted. Musculoskeletal: Degenerative changes of the thoracic spine are noted. No acute bony abnormality is seen. Review of the MIP images confirms the above findings. CTA ABDOMEN AND PELVIS FINDINGS VASCULAR Aorta: Scattered  atherosclerotic changes are noted without aneurysmal dilatation or dissection. Celiac: Patent without evidence of aneurysm, dissection, vasculitis or significant stenosis. SMA: Patent without evidence of aneurysm, dissection, vasculitis or significant stenosis. Renals: Both renal arteries are patent without evidence of aneurysm, dissection, vasculitis, fibromuscular dysplasia or significant stenosis. IMA: Patent without evidence of aneurysm, dissection, vasculitis or significant stenosis. Iliacs: Atherosclerotic changes are noted without aneurysmal dilatation or focal dissection. The femoral artery and femoral bifurcations are within normal limits bilaterally. Veins: No of ane abnormality is noted. Review of the MIP images confirms the above findings. NON-VASCULAR Hepatobiliary: Fatty infiltration of the liver is seen. The gallbladder has been surgically removed. A enhancing lesion is noted in the posterior aspect of the right lobe of the liver stable from the previous exam consistent with hemangioma. It measures approximately 3.7 cm. Pancreas: Unremarkable. No pancreatic ductal dilatation or surrounding inflammatory changes. Spleen: Normal in size without focal abnormality. Adrenals/Urinary Tract: Adrenal glands are within normal limits bilaterally. Renal cysts are noted on the left with the largest measuring approximately 4.6 cm in transverse dimension. No renal calculi  or obstructive changes are seen. The bladder is well visualized and within normal limits. Stomach/Bowel: Diverticular change of the colon is seen. No evidence of diverticulitis is noted. The appendix is not well visualized although no inflammatory changes to suggest appendicitis are seen. No obstructive changes are noted. Lymphatic: No significant lymphadenopathy is seen. Reproductive: Status post hysterectomy. No adnexal masses. Other: No abdominal wall hernia or abnormality. No abdominopelvic ascites. Musculoskeletal: Degenerative changes of  lumbar spine are noted. Review of the MIP images confirms the above findings. IMPRESSION: CTA of the chest: Stable aneurysmal dilatation of the ascending thoracic aorta. No evidence of dissection is noted. No evidence of pulmonary emboli. CTA of the abdomen and pelvis. Scattered atherosclerotic changes without acute arterial abnormality. Chronic changes in the abdomen are seen. Electronically Signed   By: Inez Catalina M.D.   On: 11/11/2017 13:49    Assessment and Plan:   1. Chest pain in patient with CAD s/p NSTEMI 2018:  Atypical chest pain described as a fleeting shooting pain across her chest from sternum to L breast, lasting for seconds and resolving spontaneously. States it did not feel similar to prior MI in 2018. Last [facility] 11/2016 with occlusion of LAD and OM1 managed with PCI/DES. Last echo 11/2016 with EF 40-45%, G1DD, and diffuse hypokinesis. Here trop negative x1. EKG with chronic LBBB. CTA C/A/P with stable ascending aortic aneurysm without dissection or PE. CXR without acute findings.  - Would favor obtaining one more troponin - if normal, no further cardiac work up is recommended; if elevated, would admit to medicine with cardiology consulting.  - Continue ASA, effient, and statin - Continue prn nitro  2. Presyncope: reported sudden onset lightheadedness upon standing up from bed this morning. She reported a Pulse Ox reading of 82% O2 sat and HR in the 40s. Symptoms sound consistent with orthostatic hypotension. Trop negative x1 here and no documented bradycardia or arrhythmia on telemetry or noted by EMS. - Could consider an outpatient event monitor   3. HTN: BP intermittently elevated - Continue home coreg and entresto - Could consider adjusting BP regimen outpatient if remains elevated at follow-up  4. Chronic combined CHF: has chronic SOB/DOE, more likely 2/2 COPD. Last echo 11/2016 with EF 40-45%, G1DD, and diffuse hypokinesis. BNP 205 on presentation and CXR without pulmonary  vascular congestion. Appears euvolemic on exam - Continue coreg and entresto  5. COPD: intermittent home O2 use.  - Continue management per primary team.   6. Ascending aortic aneurysm: Stable on CTA Chest today. - Continue routine monitoring and good BP control  For questions or updates, please contact [place] Please consult [email or web address] for contact info under Cardiology/STEMI.   Signed, Abigail Butts, PA-C  11/11/2017 2:55 PM [phone]

## 2017-11-11 NOTE — Discharge Instructions (Signed)
Your work-up today was overall reassuring.  Your CT did not show problems with your aorta.  Please keep on your oxygen and follow-up with your PCP and your cardiologist.  If any symptoms return, change, or worsen, please return to the nearest emergency department.

## 2017-11-11 NOTE — ED Notes (Signed)
Left vm for pt dtr

## 2017-11-11 NOTE — ED Notes (Signed)
Patient transported to CT 

## 2017-11-11 NOTE — ED Triage Notes (Signed)
Pt brought in by EMS for C/O Cp radiating to the abd that woke her up around 0800 today along with some slight SOB ; PT received 324 of ASA and 1 nitro prior to arrival ; PT denies any further SOB or Chest pain at this time ; hx of MI and 2 stents placed last July

## 2017-11-12 LAB — URINE CULTURE: Culture: <10000 — AB

## 2017-11-25 ENCOUNTER — Ambulatory Visit (INDEPENDENT_AMBULATORY_CARE_PROVIDER_SITE_OTHER): Payer: Medicare Other | Admitting: Cardiology

## 2017-11-25 ENCOUNTER — Encounter: Payer: Self-pay | Admitting: Cardiology

## 2017-11-25 DIAGNOSIS — Z9861 Coronary angioplasty status: Secondary | ICD-10-CM | POA: Diagnosis not present

## 2017-11-25 DIAGNOSIS — I712 Thoracic aortic aneurysm, without rupture: Secondary | ICD-10-CM | POA: Diagnosis not present

## 2017-11-25 DIAGNOSIS — I255 Ischemic cardiomyopathy: Secondary | ICD-10-CM | POA: Diagnosis not present

## 2017-11-25 DIAGNOSIS — I2510 Atherosclerotic heart disease of native coronary artery without angina pectoris: Secondary | ICD-10-CM | POA: Diagnosis not present

## 2017-11-25 DIAGNOSIS — J441 Chronic obstructive pulmonary disease with (acute) exacerbation: Secondary | ICD-10-CM | POA: Diagnosis not present

## 2017-11-25 DIAGNOSIS — I7121 Aneurysm of the ascending aorta, without rupture: Secondary | ICD-10-CM

## 2017-11-25 NOTE — Progress Notes (Signed)
11/25/2017 [name]   [birth year]  [id number]  Primary Physician Sinda Du, MD Primary Cardiologist: Dr Bronson Ing  HPI:  Pleasant 82 y/o female with a history of COPD and CAD. She had an LAD and OM PCI with DES in July 2018. She is allergic to Plavix and is on Effient. She was seen in the ED 11/11/17 for dyspnea and chest pain. Her Troponin were negative. CT did not suggest enlargement of her known ascending thoracic aneurysm. She was not felt to be in CHF- more of a COPD exacerbation. She is seen now as a post ED visit. She has DOE - no change from previous. Her weight today in the office is stable- 176.6 lbs. She does admit to having some increased wheezing which she attributes to the hot weather. She is using her O2 at night only. Her O2 sat today is 95% on RA.    Current Outpatient Medications  Medication Sig Dispense Refill  . albuterol (PROVENTIL) (2.5 MG/3ML) 0.083% nebulizer solution Take 3 mLs (2.5 mg total) by nebulization every 6 (six) hours as needed for wheezing or shortness of breath. 75 mL 12  . aspirin EC 81 MG tablet Take 81 mg by mouth every morning.     Marland Kitchen atorvastatin (LIPITOR) 80 MG tablet Take 1 tablet (80 mg total) by mouth daily at 6 PM. 30 tablet 6  . carvedilol (COREG) 6.25 MG tablet TAKE ONE TABLET BY MOUTH 2 TIMES A DAY. 180 tablet 1  . ciprofloxacin (CIPRO) 250 MG tablet Take 250 mg by mouth 2 (two) times daily.    . clorazepate (TRANXENE) 7.5 MG tablet Take 7.5 mg by mouth daily as needed for anxiety. For nerves    . meclizine (ANTIVERT) 25 MG tablet Take 1 or 2  Every 6 hours (Patient taking differently: Take 12.5-25 mg by mouth 4 (four) times daily as needed for dizziness. ) 100 tablet 3  . naproxen (NAPROSYN) 500 MG tablet Take 500 mg by mouth 2 (two) times daily with a meal.    . nitroGLYCERIN (NITROSTAT) 0.4 MG SL tablet Place 1 tablet (0.4 mg total) under the tongue every 5 (five) minutes x 3 doses as needed for chest pain. 25 tablet 12  . OXYGEN  Inhale 2 L into the lungs every evening.    . prasugrel (EFFIENT) 10 MG TABS tablet Take 10 mg by mouth daily.    . predniSONE (DELTASONE) 20 MG tablet Take 2 tablets (40 mg total) by mouth daily. 8 tablet 0  . PROAIR HFA 108 (90 BASE) MCG/ACT inhaler Inhale 2 puffs into the lungs every 6 (six) hours as needed for wheezing or shortness of breath.     . sacubitril-valsartan (ENTRESTO) 24-26 MG Take 1 tablet by mouth 2 (two) times daily. (Patient taking differently: Take 1 tablet by mouth daily. ) 60 tablet 6  . SYMBICORT 160-4.5 MCG/ACT inhaler Inhale 2 puffs into the lungs 2 (two) times daily.    . traMADol (ULTRAM) 50 MG tablet Take 50 mg by mouth daily as needed for moderate pain or severe pain. Maximum dose= 8 tablets per day    . montelukast (SINGULAIR) 10 MG tablet Take 1 tablet (10 mg total) by mouth at bedtime. (Patient not taking: Reported on 10/11/2017) 30 tablet 0   No current facility-administered medications for this visit.     Allergies  Allergen Reactions  . Plavix [Clopidogrel Bisulfate] Itching    Severe itching  . Montelukast Sodium     Pt states  she felt a "headache and her eyes started to Burn "   . Alprazolam Nausea And Vomiting  . Codeine Nausea And Vomiting  . Percodan [Oxycodone-Aspirin] Nausea And Vomiting  . Valium Nausea And Vomiting    Past Medical History:  Diagnosis Date  . Anxiety   . Cervical disc disorder with myelopathy, unspecified cervical region   . Chronic systolic (congestive) heart failure ([place])   . COPD (chronic obstructive pulmonary disease) ([place])   . Essential hypertension   . Hemorrhoids   . Liver mass   . Lung, cysts, congenital    Left lung cyst  . Myocardial infarction ([place])   . Nephrolithiasis    Embedded  . Nonischemic cardiomyopathy ([place])    LVEF 35-40% 2015  . On home O2   . Osteoarthritis   . Thoracic ascending aortic aneurysm (HCC)    4.3 cm April 2016    Social History   Socioeconomic History  . Marital status:  Widowed    Spouse name: Not on file  . Number of children: Not on file  . Years of education: 9th  . Highest education level: Not on file  Occupational History    Employer: RETIRED  Social Needs  . Financial resource strain: Not on file  . Food insecurity:    Worry: Not on file    Inability: Not on file  . Transportation needs:    Medical: Not on file    Non-medical: Not on file  Tobacco Use  . Smoking status: Former Smoker    Packs/day: 0.25    Years: 31.00    Pack years: 7.75    Types: Cigarettes    Start date: 05/14/1977    Last attempt to quit: 05/28/2008    Years since quitting: 9.5  . Smokeless tobacco: Never Used  Substance and Sexual Activity  . Alcohol use: No    Alcohol/week: 0.0 oz  . Drug use: No  . Sexual activity: Never  Lifestyle  . Physical activity:    Days per week: Not on file    Minutes per session: Not on file  . Stress: Not on file  Relationships  . Social connections:    Talks on phone: Not on file    Gets together: Not on file    Attends religious service: Not on file    Active member of club or organization: Not on file    Attends meetings of clubs or organizations: Not on file    Relationship status: Not on file  . Intimate partner violence:    Fear of current or ex partner: Not on file    Emotionally abused: Not on file    Physically abused: Not on file    Forced sexual activity: Not on file  Other Topics Concern  . Not on file  Social History Narrative  . Not on file     Family History  Problem Relation Age of Onset  . Heart disease Mother   . Aneurysm Father   . Lung cancer Brother   . Heart disease Sister   . Diabetes Brother   . Heart disease Brother      Review of Systems: General: negative for chills, fever, night sweats or weight changes.  Cardiovascular: negative for chest pain, dyspnea on exertion, edema, orthopnea, palpitations, paroxysmal nocturnal dyspnea or shortness of breath Dermatological: negative for  rash Respiratory: negative for cough or wheezing Urologic: negative for hematuria Abdominal: negative for nausea, vomiting, diarrhea, bright red blood per rectum, melena, or hematemesis  Neurologic: negative for visual changes, syncope, or dizziness All other systems reviewed and are otherwise negative except as noted above.    Blood pressure (!) 150/80, pulse 71, height 5\' 2"  (1.575 m), weight 175 lb (79.4 kg), SpO2 95 %.  General appearance: alert, cooperative and no distress Neck: no carotid bruit and no JVD Lungs: scattered expiratory wheezing Heart: regular rate and rhythm Extremities: no edema Skin: Skin color, texture, turgor normal. No rashes or lesions Neurologic: Grossly normal   ASSESSMENT AND PLAN:   COPD exacerbation (HCC) Seen in ED 11/22/17- troponin negative, no evidence to suggest CHF  CAD S/P percutaneous coronary angioplasty LAD and OM PCI with DES July 2018  Ascending aortic aneurysm (HCC) Stable -4.2 cm by CT 11/11/17  Ischemic cardiomyopathy EF 40-45%- she is on [facility]   PLAN  Same Rx. F/U with Dr Bronson Ing in 3 months, f/u with Dr Luan Pulling as scheduled in two weeks or sooner if needed.   Kerin Ransom PA-C 11/25/2017 4:01 PM

## 2017-11-25 NOTE — Patient Instructions (Signed)
Medication Instructions:  Your physician recommends that you continue on your current medications as directed. Please refer to the Current Medication list given to you today.   Labwork: NONE   Testing/Procedures: NONE   Follow-Up: Your physician recommends that you schedule a follow-up appointment in: 3 Months    Any Other Special Instructions Will Be Listed Below (If Applicable).     If you need a refill on your cardiac medications before your next appointment, please call your pharmacy.  Thank you for choosing [facility]!   

## 2017-11-25 NOTE — Assessment & Plan Note (Signed)
Seen in ED 11/22/17- troponin negative, no evidence to suggest CHF

## 2017-11-25 NOTE — Assessment & Plan Note (Signed)
EF 40-45%- she is on [employer]

## 2017-11-25 NOTE — Assessment & Plan Note (Signed)
LAD and OM PCI with DES July 2018

## 2017-11-25 NOTE — Assessment & Plan Note (Signed)
Stable -4.2 cm by CT 11/11/17

## 2017-11-29 ENCOUNTER — Other Ambulatory Visit: Payer: Self-pay | Admitting: Cardiovascular Disease

## 2017-11-29 NOTE — Telephone Encounter (Signed)
Already refilled rx, asked for it to be delivered

## 2017-11-29 NOTE — Telephone Encounter (Signed)
Needing refill on ENTRESTO 24-26 MG [[id number]]  Sent to [facility]

## 2017-12-10 DIAGNOSIS — M7581 Other shoulder lesions, right shoulder: Secondary | ICD-10-CM | POA: Diagnosis not present

## 2017-12-10 DIAGNOSIS — M19011 Primary osteoarthritis, right shoulder: Secondary | ICD-10-CM | POA: Diagnosis not present

## 2018-01-14 IMAGING — CT CT ANGIO CHEST
2 of 6 series · 19 of 36 positions shown · IV contrast (Isovue)
Comparison: Chest x-ray from earlier today and chest CT September 03, 2016

CLINICAL DATA: Cough and shortness of breath. Bronchitis. Chest
pain.

EXAM:
CT ANGIOGRAPHY CHEST WITH CONTRAST
TECHNIQUE: Multidetector CT imaging of the chest was performed using the
standard protocol during bolus administration of intravenous
contrast. Multiplanar CT image reconstructions and MIPs were
obtained to evaluate the vascular anatomy.
CONTRAST:  100 mL of Isovue 370

[Series 6: thins · axial · 0.76mm/px · z∈[+1211,+1478]mm · 18 of 297 slices shown]
[im 15/297  lung]
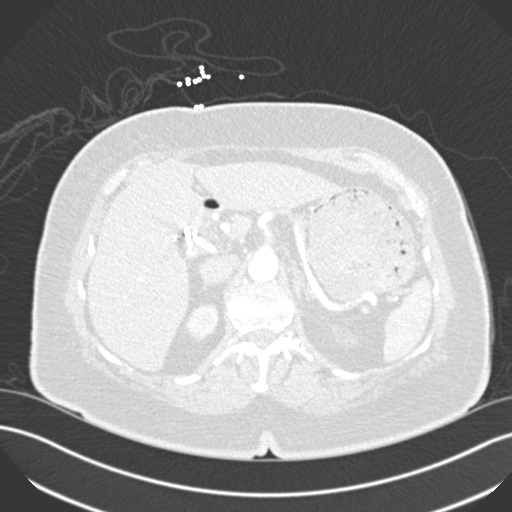
[im 30/297  mediastinal]
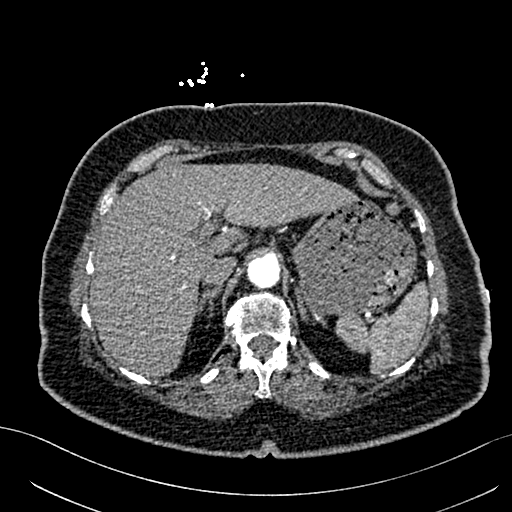
[im 45/297  lung]
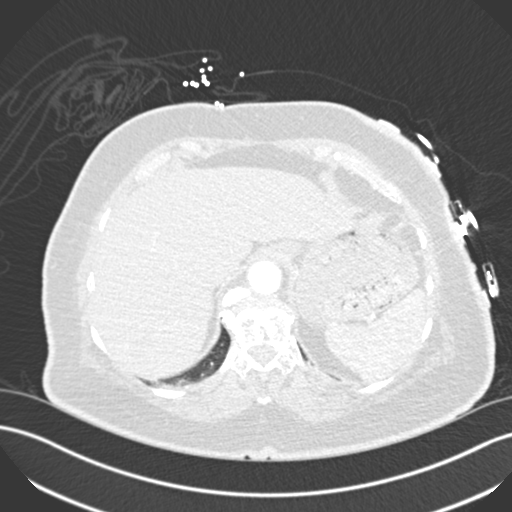
[im 60/297  mediastinal]
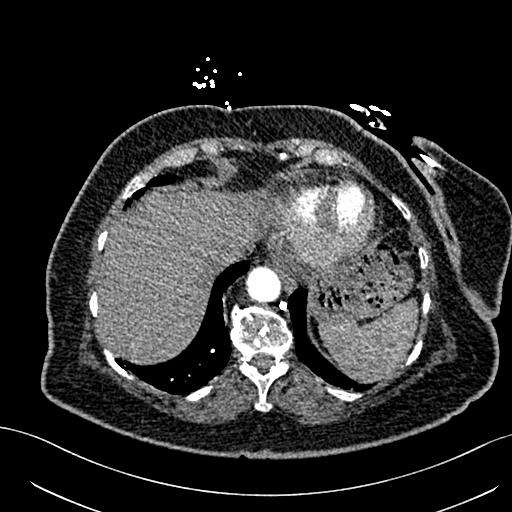
[im 75/297  lung]
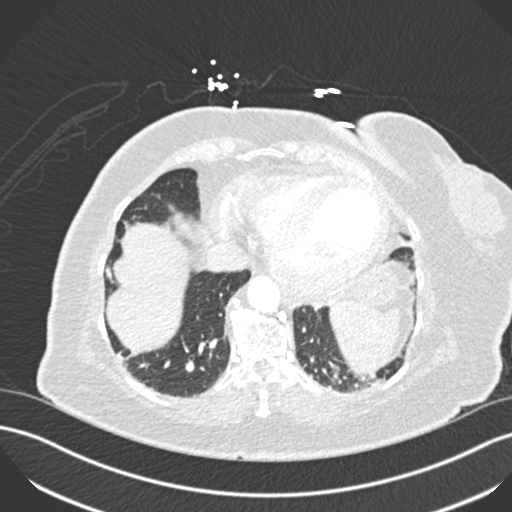
[im 89/297  mediastinal]
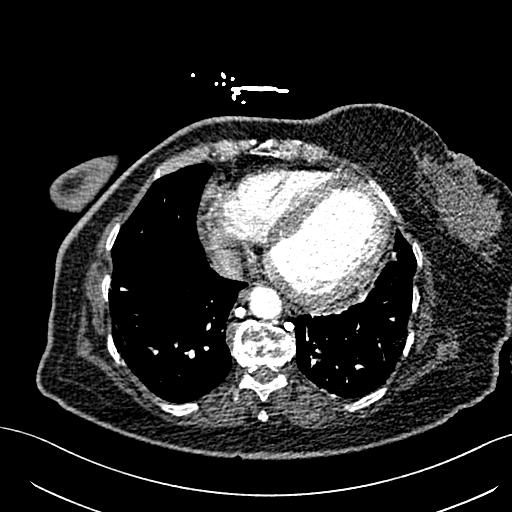
[im 104/297  lung]
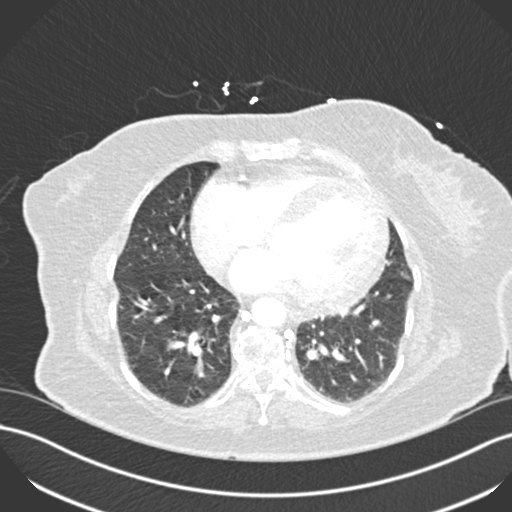
[im 119/297  mediastinal]
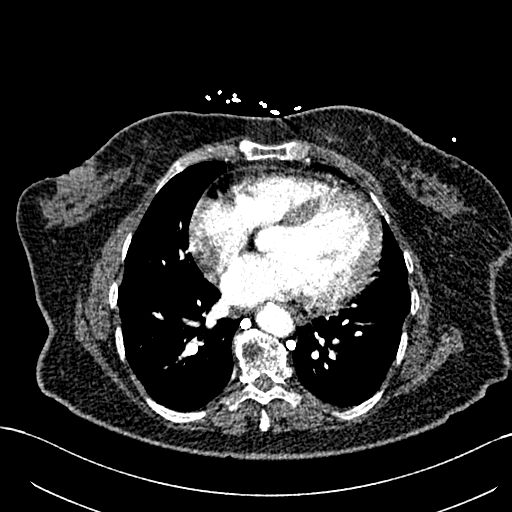
[im 134/297  lung]
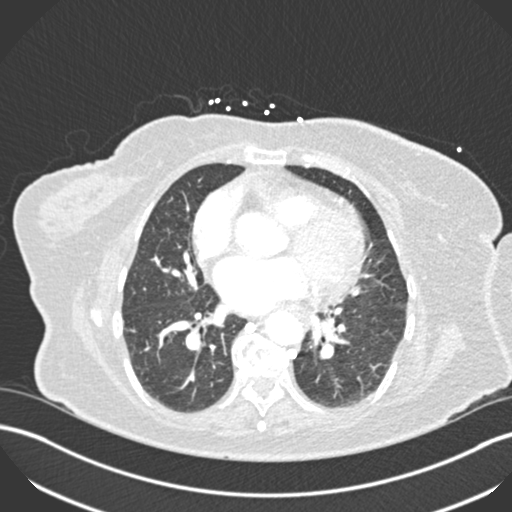
[im 163/297  mediastinal]
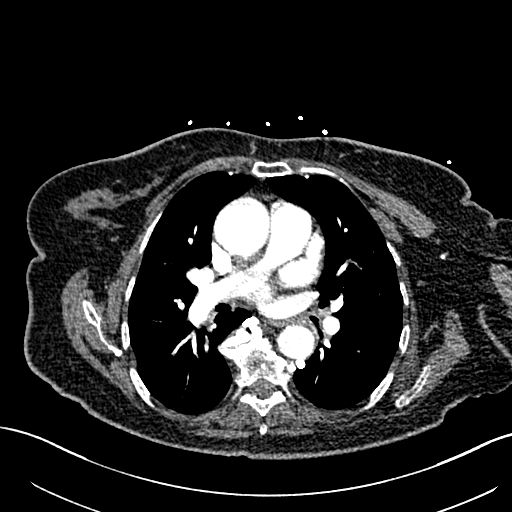
[im 178/297  lung]
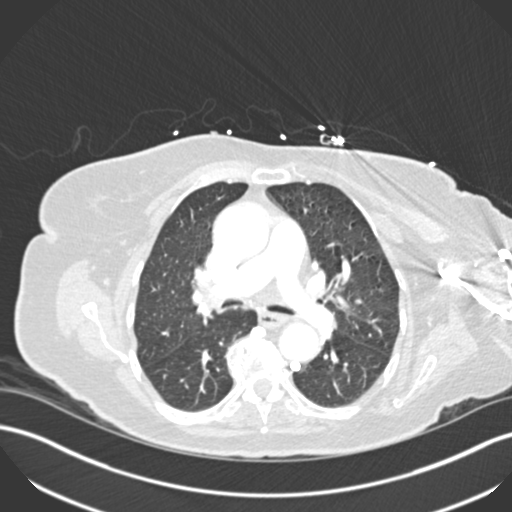
[im 193/297  mediastinal]
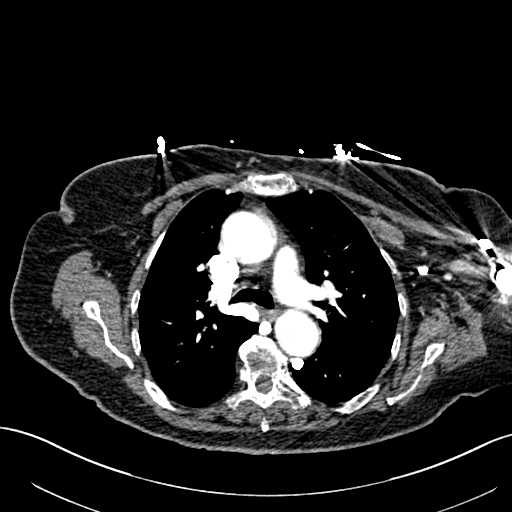
[im 208/297  lung]
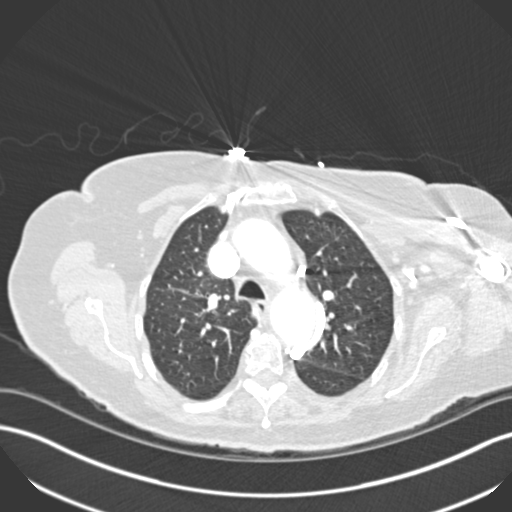
[im 223/297  mediastinal]
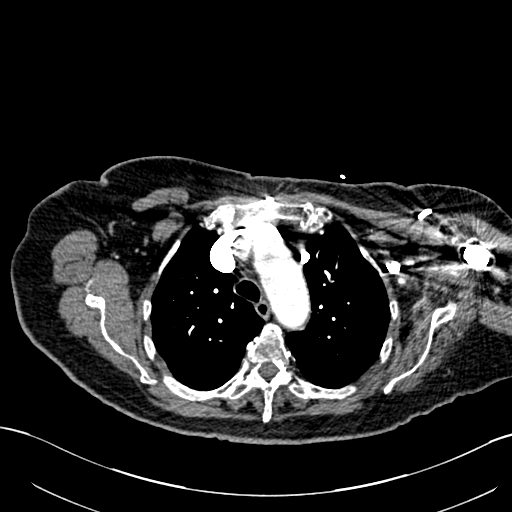
[im 237/297  lung]
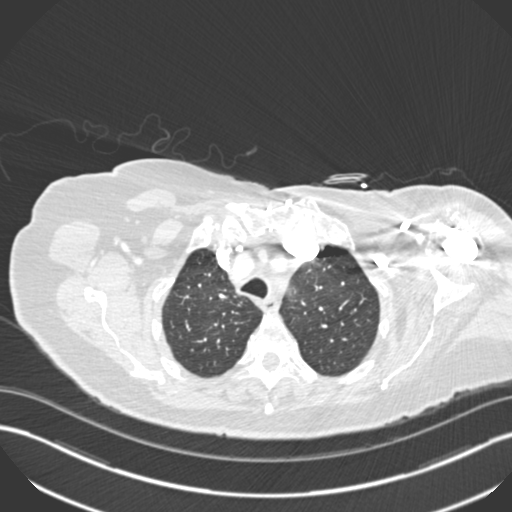
[im 252/297  mediastinal]
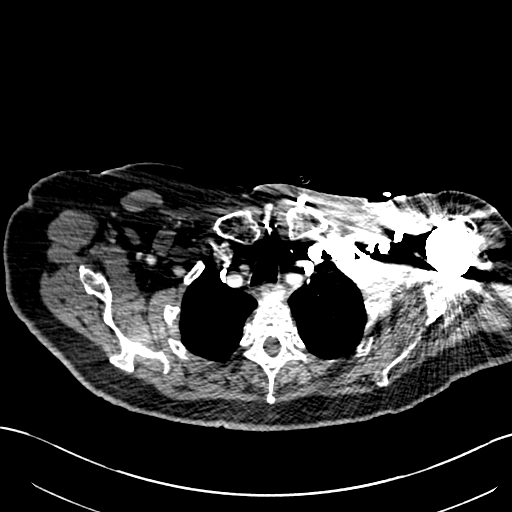
[im 267/297  lung]
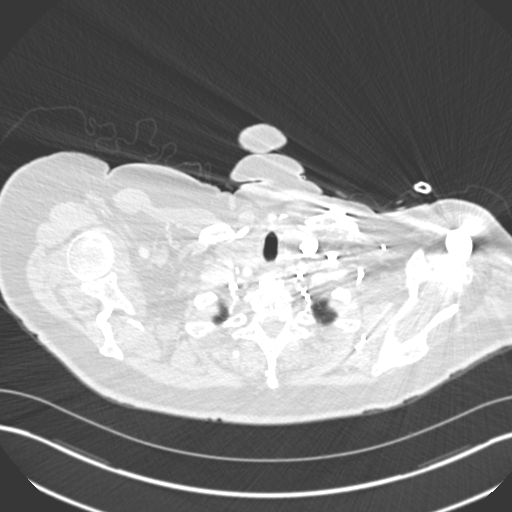
[im 282/297  mediastinal]
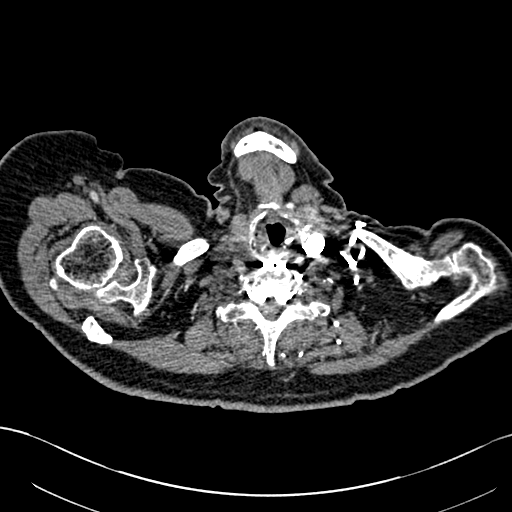

[Series 8: coronal mpr · coronal · 0.62mm/px · 1 of 151 slices shown]
[im 76/151  mediastinal]
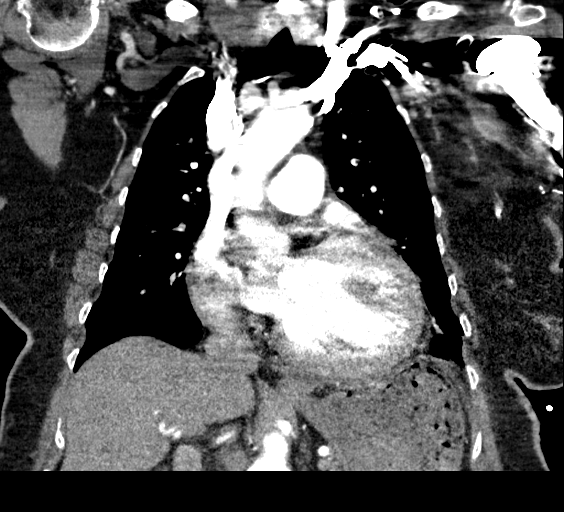

[19 of 36 positions shown; findings below may reference images not displayed]

FINDINGS: Cardiovascular: Mild cardiomegaly identified. The known 4.2 cm
ascending thoracic aortic aneurysm is stable. This was more
completely described on the September 03, 2016 CT scan of the chest. No
dissection. Minimal atherosclerotic change. Evaluation of the arch
is limited due to streak artifact off the SVC and the left superior
inner costal vein. No pulmonary emboli identified.

Mediastinum/Nodes: No enlarged mediastinal, hilar, or axillary lymph
nodes. Thyroid gland, trachea, and esophagus demonstrate no
significant findings.

Lungs/Pleura: Central airways are normal. No pneumothorax. Mild
bibasilar atelectasis. No suspicious nodules or masses.

Upper Abdomen: The patient's known right hepatic lobe hemangioma was
better seen on previous imaging. No other acute abnormalities in the
upper abdomen.

Musculoskeletal: No chest wall abnormality. No acute or significant
osseous findings.

Review of the MIP images confirms the above findings.
IMPRESSION: 1. No pulmonary emboli.
2. Known thoracic aortic aneurysm, better described on the September 03, 2016 study. No dissection today.

Aortic aneurysm NOS (CE44B-RRQ.4). Aortic Atherosclerosis
(CE44B-JZ2.2).

## 2018-01-20 ENCOUNTER — Other Ambulatory Visit: Payer: Self-pay | Admitting: Cardiovascular Disease

## 2018-02-03 ENCOUNTER — Encounter (HOSPITAL_COMMUNITY): Payer: Self-pay | Admitting: Emergency Medicine

## 2018-02-03 ENCOUNTER — Emergency Department (HOSPITAL_COMMUNITY): Payer: Medicare Other

## 2018-02-03 ENCOUNTER — Inpatient Hospital Stay (HOSPITAL_COMMUNITY)
Admission: EM | Admit: 2018-02-03 | Discharge: 2018-02-05 | DRG: 191 | Disposition: A | Discharge status: Home / Self Care | Payer: Medicare Other | Attending: Pulmonary Disease | Admitting: Pulmonary Disease

## 2018-02-03 ENCOUNTER — Other Ambulatory Visit: Payer: Self-pay

## 2018-02-03 DIAGNOSIS — Z7902 Long term (current) use of antithrombotics/antiplatelets: Secondary | ICD-10-CM

## 2018-02-03 DIAGNOSIS — M1990 Unspecified osteoarthritis, unspecified site: Secondary | ICD-10-CM | POA: Diagnosis present

## 2018-02-03 DIAGNOSIS — I447 Left bundle-branch block, unspecified: Secondary | ICD-10-CM | POA: Diagnosis present

## 2018-02-03 DIAGNOSIS — G8929 Other chronic pain: Secondary | ICD-10-CM | POA: Diagnosis present

## 2018-02-03 DIAGNOSIS — I739 Peripheral vascular disease, unspecified: Secondary | ICD-10-CM | POA: Diagnosis present

## 2018-02-03 DIAGNOSIS — G894 Chronic pain syndrome: Secondary | ICD-10-CM | POA: Diagnosis not present

## 2018-02-03 DIAGNOSIS — I10 Essential (primary) hypertension: Secondary | ICD-10-CM | POA: Diagnosis not present

## 2018-02-03 DIAGNOSIS — I712 Thoracic aortic aneurysm, without rupture: Secondary | ICD-10-CM | POA: Diagnosis present

## 2018-02-03 DIAGNOSIS — I252 Old myocardial infarction: Secondary | ICD-10-CM

## 2018-02-03 DIAGNOSIS — Z8249 Family history of ischemic heart disease and other diseases of the circulatory system: Secondary | ICD-10-CM

## 2018-02-03 DIAGNOSIS — Z7951 Long term (current) use of inhaled steroids: Secondary | ICD-10-CM

## 2018-02-03 DIAGNOSIS — I2510 Atherosclerotic heart disease of native coronary artery without angina pectoris: Secondary | ICD-10-CM | POA: Diagnosis not present

## 2018-02-03 DIAGNOSIS — Z90710 Acquired absence of both cervix and uterus: Secondary | ICD-10-CM

## 2018-02-03 DIAGNOSIS — Z7982 Long term (current) use of aspirin: Secondary | ICD-10-CM

## 2018-02-03 DIAGNOSIS — K219 Gastro-esophageal reflux disease without esophagitis: Secondary | ICD-10-CM | POA: Diagnosis present

## 2018-02-03 DIAGNOSIS — Z801 Family history of malignant neoplasm of trachea, bronchus and lung: Secondary | ICD-10-CM

## 2018-02-03 DIAGNOSIS — J441 Chronic obstructive pulmonary disease with (acute) exacerbation: Secondary | ICD-10-CM | POA: Diagnosis not present

## 2018-02-03 DIAGNOSIS — R0602 Shortness of breath: Secondary | ICD-10-CM | POA: Diagnosis not present

## 2018-02-03 DIAGNOSIS — J449 Chronic obstructive pulmonary disease, unspecified: Secondary | ICD-10-CM | POA: Diagnosis present

## 2018-02-03 DIAGNOSIS — M5000 Cervical disc disorder with myelopathy, unspecified cervical region: Secondary | ICD-10-CM | POA: Diagnosis present

## 2018-02-03 DIAGNOSIS — Z833 Family history of diabetes mellitus: Secondary | ICD-10-CM

## 2018-02-03 DIAGNOSIS — R05 Cough: Secondary | ICD-10-CM | POA: Diagnosis not present

## 2018-02-03 DIAGNOSIS — Z9981 Dependence on supplemental oxygen: Secondary | ICD-10-CM

## 2018-02-03 DIAGNOSIS — I110 Hypertensive heart disease with heart failure: Secondary | ICD-10-CM | POA: Diagnosis not present

## 2018-02-03 DIAGNOSIS — Z888 Allergy status to other drugs, medicaments and biological substances status: Secondary | ICD-10-CM

## 2018-02-03 DIAGNOSIS — Z87891 Personal history of nicotine dependence: Secondary | ICD-10-CM | POA: Diagnosis not present

## 2018-02-03 DIAGNOSIS — I5042 Chronic combined systolic (congestive) and diastolic (congestive) heart failure: Secondary | ICD-10-CM | POA: Diagnosis present

## 2018-02-03 DIAGNOSIS — F419 Anxiety disorder, unspecified: Secondary | ICD-10-CM | POA: Diagnosis present

## 2018-02-03 DIAGNOSIS — Z885 Allergy status to narcotic agent status: Secondary | ICD-10-CM

## 2018-02-03 DIAGNOSIS — Z9049 Acquired absence of other specified parts of digestive tract: Secondary | ICD-10-CM

## 2018-02-03 DIAGNOSIS — R0689 Other abnormalities of breathing: Secondary | ICD-10-CM | POA: Diagnosis not present

## 2018-02-03 DIAGNOSIS — R42 Dizziness and giddiness: Secondary | ICD-10-CM | POA: Diagnosis not present

## 2018-02-03 DIAGNOSIS — I255 Ischemic cardiomyopathy: Secondary | ICD-10-CM | POA: Diagnosis present

## 2018-02-03 DIAGNOSIS — Z955 Presence of coronary angioplasty implant and graft: Secondary | ICD-10-CM

## 2018-02-03 DIAGNOSIS — R062 Wheezing: Secondary | ICD-10-CM | POA: Diagnosis not present

## 2018-02-03 LAB — CBC WITH DIFFERENTIAL/PLATELET
Basophils Absolute: 0 10*3/uL (ref 0.0–0.1)
Basophils Relative: 0 %
Eosinophils Absolute: 1.2 10*3/uL — ABNORMAL HIGH (ref 0.0–0.7)
Eosinophils Relative: 12 %
HCT: 41.1 % (ref 36.0–46.0)
Hemoglobin: 13 g/dL (ref 12.0–15.0)
Lymphocytes Relative: 24 %
Lymphs Abs: 2.3 10*3/uL (ref 0.7–4.0)
MCH: 26.8 pg (ref 26.0–34.0)
MCHC: 31.6 g/dL (ref 30.0–36.0)
MCV: 84.7 fL (ref 78.0–100.0)
Monocytes Absolute: 0.7 10*3/uL (ref 0.1–1.0)
Monocytes Relative: 7 %
Neutro Abs: 5.4 10*3/uL (ref 1.7–7.7)
Neutrophils Relative %: 57 %
Platelets: 280 10*3/uL (ref 150–400)
RBC: 4.85 MIL/uL (ref 3.87–5.11)
RDW: 14 % (ref 11.5–15.5)
WBC: 9.6 10*3/uL (ref 4.0–10.5)

## 2018-02-03 LAB — BASIC METABOLIC PANEL
Anion gap: 11 (ref 5–15)
BUN: 10 mg/dL (ref 8–23)
CO2: 27 mmol/L (ref 22–32)
Calcium: 9.8 mg/dL (ref 8.9–10.3)
Chloride: 104 mmol/L (ref 98–111)
Creatinine, Ser: 0.64 mg/dL (ref 0.44–1.00)
GFR calc Af Amer: >60 mL/min (ref >60)
GFR calc non Af Amer: >60 mL/min (ref >60)
Glucose, Bld: 107 mg/dL — ABNORMAL HIGH (ref 70–99)
Potassium: 3.9 mmol/L (ref 3.5–5.1)
Sodium: 142 mmol/L (ref 135–145)

## 2018-02-03 LAB — BRAIN NATRIURETIC PEPTIDE: B Natriuretic Peptide: 326 pg/mL — ABNORMAL HIGH (ref 0.0–100.0)

## 2018-02-03 LAB — TROPONIN I
Troponin I: <0.03 ng/mL (ref <0.03)
Troponin I: <0.03 ng/mL (ref <0.03)
Troponin I: <0.03 ng/mL (ref <0.03)

## 2018-02-03 MED ORDER — CARVEDILOL 3.125 MG PO TABS
6.2500 mg | ORAL_TABLET | Freq: Two times a day (BID) | ORAL | Status: DC
  Administered 2018-02-03 – 2018-02-05 (×4): 6.25 mg via ORAL
  Filled 2018-02-03 (×4): qty 2

## 2018-02-03 MED ORDER — DOXYCYCLINE HYCLATE 100 MG PO TABS
100.0000 mg | ORAL_TABLET | Freq: Two times a day (BID) | ORAL | Status: DC
  Administered 2018-02-03 – 2018-02-05 (×4): 100 mg via ORAL
  Filled 2018-02-03 (×4): qty 1

## 2018-02-03 MED ORDER — IPRATROPIUM BROMIDE 0.02 % IN SOLN
0.5000 mg | Freq: Once | RESPIRATORY_TRACT | Status: AC
  Administered 2018-02-03: 0.5 mg via RESPIRATORY_TRACT
  Filled 2018-02-03: qty 2.5

## 2018-02-03 MED ORDER — MONTELUKAST SODIUM 10 MG PO TABS: 10.0000 mg | ORAL_TABLET | Freq: Every day | ORAL | Status: DC

## 2018-02-03 MED ORDER — SACUBITRIL-VALSARTAN 24-26 MG PO TABS
1.0000 | ORAL_TABLET | Freq: Two times a day (BID) | ORAL | Status: DC
  Administered 2018-02-03 – 2018-02-05 (×4): 1 via ORAL
  Filled 2018-02-03 (×8): qty 1

## 2018-02-03 MED ORDER — SENNOSIDES-DOCUSATE SODIUM 8.6-50 MG PO TABS: 1.0000 | ORAL_TABLET | Freq: Every evening | ORAL | Status: DC | PRN

## 2018-02-03 MED ORDER — MOMETASONE FURO-FORMOTEROL FUM 200-5 MCG/ACT IN AERO
2.0000 | INHALATION_SPRAY | Freq: Two times a day (BID) | RESPIRATORY_TRACT | Status: DC
  Administered 2018-02-03 – 2018-02-05 (×4): 2 via RESPIRATORY_TRACT
  Filled 2018-02-03: qty 8.8

## 2018-02-03 MED ORDER — METHYLPREDNISOLONE SODIUM SUCC 125 MG IJ SOLR: 125.0000 mg | Freq: Once | INTRAMUSCULAR | Status: DC

## 2018-02-03 MED ORDER — CLORAZEPATE DIPOTASSIUM 7.5 MG PO TABS: 7.5000 mg | ORAL_TABLET | Freq: Two times a day (BID) | ORAL | Status: DC | PRN

## 2018-02-03 MED ORDER — ACETAMINOPHEN 325 MG PO TABS: 650.0000 mg | ORAL_TABLET | Freq: Four times a day (QID) | ORAL | Status: DC | PRN

## 2018-02-03 MED ORDER — MECLIZINE HCL 12.5 MG PO TABS: 12.5000 mg | ORAL_TABLET | Freq: Four times a day (QID) | ORAL | Status: DC | PRN

## 2018-02-03 MED ORDER — ATORVASTATIN CALCIUM 40 MG PO TABS
80.0000 mg | ORAL_TABLET | Freq: Every day | ORAL | Status: DC
  Administered 2018-02-03 – 2018-02-04 (×2): 80 mg via ORAL
  Filled 2018-02-03 (×2): qty 2

## 2018-02-03 MED ORDER — PRASUGREL HCL 10 MG PO TABS
10.0000 mg | ORAL_TABLET | Freq: Every day | ORAL | Status: DC
  Administered 2018-02-04 – 2018-02-05 (×2): 10 mg via ORAL
  Filled 2018-02-03 (×4): qty 1

## 2018-02-03 MED ORDER — ALBUTEROL SULFATE (2.5 MG/3ML) 0.083% IN NEBU: 2.5000 mg | INHALATION_SOLUTION | Freq: Four times a day (QID) | RESPIRATORY_TRACT | Status: DC | PRN

## 2018-02-03 MED ORDER — PREDNISONE 20 MG PO TABS
40.0000 mg | ORAL_TABLET | Freq: Every day | ORAL | Status: DC
  Administered 2018-02-04 – 2018-02-05 (×2): 40 mg via ORAL
  Filled 2018-02-03 (×3): qty 2

## 2018-02-03 MED ORDER — ONDANSETRON HCL 4 MG/2ML IJ SOLN: 4.0000 mg | Freq: Four times a day (QID) | INTRAMUSCULAR | Status: DC | PRN

## 2018-02-03 MED ORDER — ONDANSETRON HCL 4 MG PO TABS: 4.0000 mg | ORAL_TABLET | Freq: Four times a day (QID) | ORAL | Status: DC | PRN

## 2018-02-03 MED ORDER — ACETAMINOPHEN 650 MG RE SUPP: 650.0000 mg | Freq: Four times a day (QID) | RECTAL | Status: DC | PRN

## 2018-02-03 MED ORDER — ALBUTEROL SULFATE (2.5 MG/3ML) 0.083% IN NEBU
5.0000 mg | INHALATION_SOLUTION | Freq: Once | RESPIRATORY_TRACT | Status: AC
  Administered 2018-02-03: 5 mg via RESPIRATORY_TRACT
  Filled 2018-02-03: qty 6

## 2018-02-03 MED ORDER — TRAMADOL HCL 50 MG PO TABS: 50.0000 mg | ORAL_TABLET | Freq: Every day | ORAL | Status: DC | PRN

## 2018-02-03 MED ORDER — NITROGLYCERIN 0.4 MG SL SUBL: 0.4000 mg | SUBLINGUAL_TABLET | SUBLINGUAL | Status: DC | PRN

## 2018-02-03 MED ORDER — ASPIRIN EC 81 MG PO TBEC
81.0000 mg | DELAYED_RELEASE_TABLET | Freq: Every morning | ORAL | Status: DC
  Administered 2018-02-04 – 2018-02-05 (×2): 81 mg via ORAL
  Filled 2018-02-03 (×2): qty 1

## 2018-02-03 MED ORDER — METHYLPREDNISOLONE SODIUM SUCC 40 MG IJ SOLR
40.0000 mg | Freq: Two times a day (BID) | INTRAMUSCULAR | Status: AC
  Administered 2018-02-03 – 2018-02-04 (×2): 40 mg via INTRAVENOUS
  Filled 2018-02-03 (×2): qty 1

## 2018-02-03 MED ORDER — ENOXAPARIN SODIUM 40 MG/0.4ML ~~LOC~~ SOLN
40.0000 mg | SUBCUTANEOUS | Status: DC
  Administered 2018-02-03 – 2018-02-04 (×2): 40 mg via SUBCUTANEOUS
  Filled 2018-02-03 (×2): qty 0.4

## 2018-02-03 NOTE — ED Provider Notes (Signed)
[facility] EMERGENCY DEPARTMENT Provider Note   CSN: [id number] Arrival date & time: 02/03/18  1039     History   Chief Complaint Chief Complaint  Patient presents with  . Shortness of Breath    HPI [name] is a 82 y.o. female.  Patient presents with worsening shortness of breath and cough for the past day.  Patient's had COPD in the past and this overall feels similar.  Patient does have heart failure as well.  Patient denies weight gain or leg swelling.  Mild productive cough.  Anterior chest pressure.  Past cigarette smoker.  Heart attack in the past.  Patient now on home oxygen at night which is improved her symptoms.     Past Medical History:  Diagnosis Date  . Anxiety   . Cervical disc disorder with myelopathy, unspecified cervical region   . Chronic systolic (congestive) heart failure ([place])   . COPD (chronic obstructive pulmonary disease) ([place])   . Essential hypertension   . Hemorrhoids   . Liver mass   . Lung, cysts, congenital    Left lung cyst  . Myocardial infarction ([place])   . Nephrolithiasis    Embedded  . Nonischemic cardiomyopathy ([place])    LVEF 35-40% 2015  . On home O2   . Osteoarthritis   . Thoracic ascending aortic aneurysm (HCC)    4.3 cm April 2016    Patient Active Problem List   Diagnosis Date Noted  . Ischemic cardiomyopathy 11/25/2017  . Dizziness 04/01/2017  . COPD (chronic obstructive pulmonary disease) ([place]) 04/01/2017  . CAD S/P percutaneous coronary angioplasty 04/01/2017  . NSTEMI (non-ST elevated myocardial infarction) ([place]) 11/23/2016  . Hypertensive cardiovascular disease 10/03/2015  . Protein-calorie malnutrition, severe 07/13/2015  . Demand ischemia of myocardium ([place]) 07/13/2015  . Nonischemic cardiomyopathy ([place])   . Atypical chest pain   . Left bundle branch block   . COPD exacerbation ([place]) 07/11/2015  . Flu-like symptoms 07/11/2015  . Elevated troponin 07/11/2015  . Acute bronchitis 07/11/2015  . GERD  (gastroesophageal reflux disease) 07/11/2015  . Bronchospasm   . Ascending aortic aneurysm ([place]) 01/27/2015  . Chronic combined systolic and diastolic CHF (congestive heart failure) ([place]) 01/27/2015  . Chest pain at rest 01/27/2015  . Essential hypertension 01/27/2015  . Anxiety 01/27/2015  . Chronic pain 01/27/2015  . Chest pain 08/20/2013  . Hypertension 08/20/2013  . Contusion of left knee 08/07/2012  . Sprain of wrist 08/07/2012  . Liver mass 05/21/2011  . Abdominal pain 05/21/2011  . Bronchitis 05/21/2011  . De Quervain's disease (tenosynovitis) 04/02/2011  . SHOULDER PAIN 01/19/2009  . CERVICALGIA 01/19/2009  . IMPINGEMENT SYNDROME 01/19/2009    Past Surgical History:  Procedure Laterality Date  . Benign breast tumors    . CHOLECYSTECTOMY    . COLONOSCOPY    . COLONOSCOPY N/A 09/22/2014   Procedure: COLONOSCOPY;  Surgeon: Rogene Houston, MD;  Location: [facility];  Service: Endoscopy;  Laterality: N/A;  830 -- to be done in OR under fluoro  . Complete hysterectomy    . CORONARY STENT INTERVENTION N/A 11/23/2016   Procedure: Coronary Stent Intervention;  Surgeon: Martinique, Peter M, MD;  Location: [facility];  Service: Cardiovascular;  Laterality: N/A;  . LEFT HEART CATH AND CORONARY ANGIOGRAPHY N/A 11/23/2016   Procedure: Left Heart Cath and Coronary Angiography;  Surgeon: Martinique, Peter M, MD;  Location: [facility] CV LAB;  Service: Cardiovascular;  Laterality: N/A;  . TONSILLECTOMY AND ADENOIDECTOMY  OB History   None      Home Medications    Prior to Admission medications   Medication Sig Start Date End Date Taking? Authorizing Provider  albuterol (PROVENTIL) (2.5 MG/3ML) 0.083% nebulizer solution Take 3 mLs (2.5 mg total) by nebulization every 6 (six) hours as needed for wheezing or shortness of breath. 07/13/15  Yes Sinda Du, MD  aspirin EC 81 MG tablet Take 81 mg by mouth every morning.    Yes [provider]  atorvastatin (LIPITOR)  80 MG tablet Take 1 tablet (80 mg total) by mouth daily at 6 PM. 11/24/16  Yes Bhagat, Bhavinkumar, PA  carvedilol (COREG) 6.25 MG tablet TAKE ONE TABLET BY MOUTH 2 TIMES A DAY. 01/20/18  Yes Herminio Commons, MD  clorazepate (TRANXENE) 7.5 MG tablet Take 7.5 mg by mouth daily as needed for anxiety. For nerves   Yes [provider]  ENTRESTO 24-26 MG TAKE 1 TABLET BY MOUTH TWICE DAILY. 11/29/17  Yes Herminio Commons, MD  meclizine (ANTIVERT) 25 MG tablet Take 1 or 2  Every 6 hours Patient taking differently: Take 12.5-25 mg by mouth 4 (four) times daily as needed for dizziness.  04/03/17  Yes Sinda Du, MD  naproxen (NAPROSYN) 500 MG tablet Take 500 mg by mouth 2 (two) times daily with a meal.   Yes [provider]  nitroGLYCERIN (NITROSTAT) 0.4 MG SL tablet Place 1 tablet (0.4 mg total) under the tongue every 5 (five) minutes x 3 doses as needed for chest pain. 11/24/16  Yes Bhagat, Bhavinkumar, PA  OXYGEN Inhale 2 L into the lungs every evening.   Yes [provider]  prasugrel (EFFIENT) 10 MG TABS tablet Take 10 mg by mouth daily.   Yes [provider]  PROAIR HFA 108 (90 BASE) MCG/ACT inhaler Inhale 2 puffs into the lungs every 6 (six) hours as needed for wheezing or shortness of breath.  02/08/14  Yes [provider]  SYMBICORT 160-4.5 MCG/ACT inhaler Inhale 2 puffs into the lungs 2 (two) times daily. 09/23/17  Yes [provider]  traMADol (ULTRAM) 50 MG tablet Take 50 mg by mouth daily as needed for moderate pain or severe pain. Maximum dose= 8 tablets per day   Yes [provider]  montelukast (SINGULAIR) 10 MG tablet Take 1 tablet (10 mg total) by mouth at bedtime. Patient not taking: Reported on 10/11/2017 08/18/17 09/17/17  Noemi Chapel, MD  predniSONE (DELTASONE) 20 MG tablet Take 2 tablets (40 mg total) by mouth daily. Patient not taking: Reported on 02/03/2018 10/11/17   Virgel Manifold, MD    Family History Family  History  Problem Relation Age of Onset  . Heart disease Mother   . Aneurysm Father   . Lung cancer Brother   . Heart disease Sister   . Diabetes Brother   . Heart disease Brother     Social History Social History   Tobacco Use  . Smoking status: Former Smoker    Packs/day: 0.25    Years: 31.00    Pack years: 7.75    Types: Cigarettes    Start date: 05/14/1977    Last attempt to quit: 05/28/2008    Years since quitting: 9.6  . Smokeless tobacco: Never Used  Substance Use Topics  . Alcohol use: No    Alcohol/week: 0.0 standard drinks  . Drug use: No     Allergies   Plavix [clopidogrel bisulfate]; Montelukast sodium; Alprazolam; Codeine; Percodan [oxycodone-aspirin]; and Valium   Review of  Systems Review of Systems  Constitutional: Negative for chills and fever.  HENT: Negative for congestion.   Eyes: Negative for visual disturbance.  Respiratory: Positive for cough and shortness of breath.   Cardiovascular: Positive for chest pain. Negative for leg swelling.  Gastrointestinal: Negative for abdominal pain and vomiting.  Genitourinary: Negative for dysuria and flank pain.  Musculoskeletal: Negative for back pain, neck pain and neck stiffness.  Skin: Negative for rash.  Neurological: Negative for light-headedness and headaches.     Physical Exam Updated Vital Signs BP 131/76   Pulse 74   Temp 98.6 F (37 C) (Oral)   Resp (!) 23   Ht 5\' 1"  (1.549 m)   Wt 79.5 kg   SpO2 90%   BMI 33.12 kg/m   Physical Exam  Constitutional: She is oriented to person, place, and time. She appears well-developed and well-nourished.  HENT:  Head: Normocephalic and atraumatic.  Eyes: Conjunctivae are normal. Right eye exhibits no discharge. Left eye exhibits no discharge.  Neck: Normal range of motion. Neck supple. No tracheal deviation present.  Cardiovascular: Normal rate and regular rhythm.  Pulmonary/Chest: Tachypnea noted. She has wheezes in the right upper field, the right  middle field, the right lower field, the left upper field, the left middle field and the left lower field.  Abdominal: Soft. She exhibits no distension. There is no tenderness. There is no guarding.  Musculoskeletal: She exhibits no edema.  Neurological: She is alert and oriented to person, place, and time.  Skin: Skin is warm. No rash noted.  Psychiatric: She has a normal mood and affect.  Nursing note and vitals reviewed.    ED Treatments / Results  Labs (all labs ordered are listed, but only abnormal results are displayed) Labs Reviewed  BRAIN NATRIURETIC PEPTIDE - Abnormal; Notable for the following components:      Result Value   B Natriuretic Peptide 326.0 (*)    All other components within normal limits  BASIC METABOLIC PANEL - Abnormal; Notable for the following components:   Glucose, Bld 107 (*)    All other components within normal limits  CBC WITH DIFFERENTIAL/PLATELET - Abnormal; Notable for the following components:   Eosinophils Absolute 1.2 (*)    All other components within normal limits  TROPONIN I    EKG EKG Interpretation  Date/Time:  Monday February 03 2018 10:50:26 EDT Ventricular Rate:  69 PR Interval:    QRS Duration: 173 QT Interval:  445 QTC Calculation: 477 R Axis:   36 Text Interpretation:  Sinus rhythm Left bundle branch block Confirmed by Elnora Morrison [phone]) on 02/03/2018 11:05:53 AM   Radiology Dg Chest 2 View  Result Date: 02/03/2018 CLINICAL DATA:  Shortness of breath, cough, COPD EXAM: CHEST - 2 VIEW COMPARISON:  11/11/2017 FINDINGS: Enlargement of cardiac silhouette. Mediastinal contours and pulmonary vascularity normal. Emphysematous changes consistent with COPD. No acute infiltrate, pleural effusion or pneumothorax. Bones demineralized with evidence of prior cervical spine fusion and LEFT shoulder arthroplasty. Again identified sclerosis and significant spur formation at the anterior aspect of the midthoracic spine. IMPRESSION:  Enlargement of cardiac silhouette. COPD changes without acute abnormalities. Electronically Signed   By: Lavonia Dana M.D.   On: 02/03/2018 12:50    Procedures Procedures (including critical care time)  Medications Ordered in ED Medications  albuterol (PROVENTIL) (2.5 MG/3ML) 0.083% nebulizer solution 5 mg (has no administration in time range)  albuterol (PROVENTIL) (2.5 MG/3ML) 0.083% nebulizer solution 5 mg (5 mg Nebulization Given 02/03/18 1128)  ipratropium (ATROVENT) nebulizer solution 0.5 mg (0.5 mg Nebulization Given 02/03/18 1128)     Initial Impression / Assessment and Plan / ED Course  I have reviewed the triage vital signs and the nursing notes.  Pertinent labs & imaging results that were available during my care of the patient were reviewed by me and considered in my medical decision making (see chart for details).   Patient presents with worsening shortness of breath, wheezing and COPD history.  Patient denies any specific orthopnea, weight gain, leg swelling.  This is most likely acute COPD exacerbation.  Plan for repeat nebulizer, patient received nebs on route.  Steroids received prior to arrival. On reassessment patient persistent wheeze.  Repeat nebulizer ordered and plan for observation.  Discussed with hospitalist.  Troponin negative.  Final Clinical Impressions(s) / ED Diagnoses   Final diagnoses:  Acute exacerbation of chronic obstructive pulmonary disease (COPD) [facility])    ED Discharge Orders    None       Elnora Morrison, MD 02/03/18 1526

## 2018-02-03 NOTE — H&P (Signed)
History and Physical    [name] INO:[id number] DOB: June 16, 1935 DOA: 02/03/2018  PCP: Sinda Du, MD  Patient coming from: Home  I have personally briefly reviewed patient's old medical records in [place]  Chief Complaint: DOE  HPI: [name] is a 82 y.o. female with medical history significant of chronic respiratory failure on nocturnal oxygen, chronic CHF, coronary artery disease presents with dyspnea on exertion.  Over the weekend patient started having increasing dyspnea on exertion.  Today she used her inhalers and did not help her very much.  She states compliance with her medications.  She also describes some chest discomfort all over her chest.  It feels of something laying on her chest.  This discomfort was different from when she had a heart attack.  She denies any fever.  She states she is been extremely fatigued.  Her chronic cough continues but has had increase in her sputum production recently.  Patient lives alone.  She has not had any obvious sick contacts and has not traveled recently.  Patient chest x-ray was nonacute,and  troponin was negative as well.  Her EKG showed her chronic left bundle branch block.   ED Course: Patient was given 3 nebulizers with some improvement of her shortness of breath .  Review of Systems: Positive for anorexia, no nausea or vomiting All others reviewed with patient  and are  negative unless otherwise stated   Past Medical History:  Diagnosis Date  . Anxiety   . Cervical disc disorder with myelopathy, unspecified cervical region   . Chronic systolic (congestive) heart failure ([place])   . COPD (chronic obstructive pulmonary disease) ([place])   . Essential hypertension   . Hemorrhoids   . Liver mass   . Lung, cysts, congenital    Left lung cyst  . Myocardial infarction ([place])   . Nephrolithiasis    Embedded  . Nonischemic cardiomyopathy ([place])    LVEF 35-40% 2015  . On home O2   . Osteoarthritis   . Thoracic ascending  aortic aneurysm (H[facility])    4.3 cm April 2016    Past Surgical History:  Procedure Laterality Date  . Benign breast tumors    . CHOLECYSTECTOMY    . COLONOSCOPY    . COLONOSCOPY N/A 09/22/2014   Procedure: COLONOSCOPY;  Surgeon: Rogene Houston, MD;  [facility];  Service: Endoscopy;  Laterality: N/A;  830 -- to be done in OR under fluoro  . Complete hysterectomy    . CORONARY STENT INTERVENTION N/A 11/23/2016   Procedure: Coronary Stent Intervention;  Surgeon: Martinique, Peter M, MD;  Location: [facility];  Service: Cardiovascular;  Laterality: N/A;  . LEFT HEART CATH AND CORONARY ANGIOGRAPHY N/A 11/23/2016   Procedure: Left Heart Cath and Coronary Angiography;  Surgeon: Martinique, Peter M, MD;  Location: [facility];  Service: Cardiovascular;  Laterality: N/A;  . TONSILLECTOMY AND ADENOIDECTOMY       reports that she quit smoking about 9 years ago. Her smoking use included cigarettes. She started smoking about 40 years ago. She has a 7.75 pack-year smoking history. She has never used smokeless tobacco. She reports that she does not drink alcohol or use drugs.  Allergies  Allergen Reactions  . Plavix [Clopidogrel Bisulfate] Itching    Severe itching  . Montelukast Sodium     Pt states she felt a "headache and her eyes started to Burn "   . Alprazolam Nausea And Vomiting  . Codeine  Nausea And Vomiting  . Percodan [Oxycodone-Aspirin] Nausea And Vomiting  . Valium Nausea And Vomiting    Family History  Problem Relation Age of Onset  . Heart disease Mother   . Aneurysm Father   . Lung cancer Brother   . Heart disease Sister   . Diabetes Brother   . Heart disease Brother     Prior to Admission medications   Medication Sig Start Date End Date Taking? Authorizing Provider  albuterol (PROVENTIL) (2.5 MG/3ML) 0.083% nebulizer solution Take 3 mLs (2.5 mg total) by nebulization every 6 (six) hours as needed for wheezing or shortness of breath. 07/13/15  Yes Sinda Du, MD  aspirin EC 81 MG tablet Take 81 mg by mouth every morning.    Yes [provider]  atorvastatin (LIPITOR) 80 MG tablet Take 1 tablet (80 mg total) by mouth daily at 6 PM. 11/24/16  Yes Bhagat, Bhavinkumar, PA  carvedilol (COREG) 6.25 MG tablet TAKE ONE TABLET BY MOUTH 2 TIMES A DAY. 01/20/18  Yes Herminio Commons, MD  clorazepate (TRANXENE) 7.5 MG tablet Take 7.5 mg by mouth daily as needed for anxiety. For nerves   Yes [provider]  ENTRESTO 24-26 MG TAKE 1 TABLET BY MOUTH TWICE DAILY. 11/29/17  Yes Herminio Commons, MD  meclizine (ANTIVERT) 25 MG tablet Take 1 or 2  Every 6 hours Patient taking differently: Take 12.5-25 mg by mouth 4 (four) times daily as needed for dizziness.  04/03/17  Yes Sinda Du, MD  naproxen (NAPROSYN) 500 MG tablet Take 500 mg by mouth 2 (two) times daily with a meal.   Yes [provider]  nitroGLYCERIN (NITROSTAT) 0.4 MG SL tablet Place 1 tablet (0.4 mg total) under the tongue every 5 (five) minutes x 3 doses as needed for chest pain. 11/24/16  Yes Bhagat, Bhavinkumar, PA  OXYGEN Inhale 2 L into the lungs every evening.   Yes [provider]  prasugrel (EFFIENT) 10 MG TABS tablet Take 10 mg by mouth daily.   Yes [provider]  PROAIR HFA 108 (90 BASE) MCG/ACT inhaler Inhale 2 puffs into the lungs every 6 (six) hours as needed for wheezing or shortness of breath.  02/08/14  Yes [provider]  SYMBICORT 160-4.5 MCG/ACT inhaler Inhale 2 puffs into the lungs 2 (two) times daily. 09/23/17  Yes [provider]  traMADol (ULTRAM) 50 MG tablet Take 50 mg by mouth daily as needed for moderate pain or severe pain. Maximum dose= 8 tablets per day   Yes [provider]  montelukast (SINGULAIR) 10 MG tablet Take 1 tablet (10 mg total) by mouth at bedtime. Patient not taking: Reported on 10/11/2017 08/18/17 09/17/17  Noemi Chapel, MD  predniSONE (DELTASONE) 20 MG tablet Take 2 tablets (40 mg  total) by mouth daily. Patient not taking: Reported on 02/03/2018 10/11/17   Virgel Manifold, MD    Physical Exam: Vitals:   02/03/18 1500 02/03/18 1530 02/03/18 1532 02/03/18 1600  BP: 131/76 (!) 117/58  (!) 125/55  Pulse: 74 82  83  Resp: (!) 23 19  (!) 22  Temp:      TempSrc:      SpO2: 90% (!) 89% 90% (!) 88%  Weight:      Height:        Constitutional: NAD, calm, comfortable Vitals:   02/03/18 1500 02/03/18 1530 02/03/18 1532 02/03/18 1600  BP: 131/76 (!) 117/58  (!) 125/55  Pulse: 74 82  83  Resp: (!) 23  19  (!) 22  Temp:      TempSrc:      SpO2: 90% (!) 89% 90% (!) 88%  Weight:      Height:       Eyes: PERRL, lids and conjunctivae normal ENMT: Mucous membranes are moist. Posterior pharynx clear of any exudate or lesions.  Neck: normal, supple, no masses,  Respiratory: Few scattered end expiratory wheezes  Normal respiratory effort. No accessory muscle use.  Cardiovascular: Regular rate and rhythm, no murmurs / rubs / gallops. No extremity edema. 2+ pedal pulses. .  Abdomen: no tenderness, no masses palpated. No hepatosplenomegaly. Bowel sounds positive.  Musculoskeletal: no clubbing / cyanosis. No joint deformity upper and lower extremities.  no contractures. Normal muscle tone.  Skin: no rashes, lesions, ulcers. No induration Neurologic: CN 2-12 grossly intact. . Strength 5/5 in all 4.  Generally weak Psychiatric: Normal judgment and insight. Alert and oriented x 3. Normal mood.    Labs on Admission: I have personally reviewed following labs and imaging studies  CBC: Recent Labs  Lab 02/03/18 1119  WBC 9.6  NEUTROABS 5.4  HGB 13.0  HCT 41.1  MCV 84.7  PLT 161   Basic Metabolic Panel: Recent Labs  Lab 02/03/18 1119  NA 142  K 3.9  CL 104  CO2 27  GLUCOSE 107*  BUN 10  CREATININE 0.64  CALCIUM 9.8   GFR: Estimated Creatinine Clearance: 53.6 mL/min (by C-G formula based on SCr of 0.64 mg/dL). Liver Function Tests: No results for input(s): AST,  ALT, ALKPHOS, BILITOT, PROT, ALBUMIN in the last 168 hours. No results for input(s): LIPASE, AMYLASE in the last 168 hours. No results for input(s): AMMONIA in the last 168 hours. Coagulation Profile: No results for input(s): INR, PROTIME in the last 168 hours. Cardiac Enzymes: Recent Labs  Lab 02/03/18 1119  TROPONINI <0.03   BNP (last 3 results) No results for input(s): PROBNP in the last 8760 hours. HbA1C: No results for input(s): HGBA1C in the last 72 hours. CBG: No results for input(s): GLUCAP in the last 168 hours. Lipid Profile: No results for input(s): CHOL, HDL, LDLCALC, TRIG, CHOLHDL, LDLDIRECT in the last 72 hours. Thyroid Function Tests: No results for input(s): TSH, T4TOTAL, FREET4, T3FREE, THYROIDAB in the last 72 hours. Anemia Panel: No results for input(s): VITAMINB12, FOLATE, FERRITIN, TIBC, IRON, RETICCTPCT in the last 72 hours. Urine analysis:    Component Value Date/Time   COLORURINE STRAW (A) 11/11/2017 1122   APPEARANCEUR CLEAR 11/11/2017 1122   LABSPEC 1.006 11/11/2017 1122   PHURINE 6.0 11/11/2017 1122   GLUCOSEU NEGATIVE 11/11/2017 1122   HGBUR NEGATIVE 11/11/2017 1122   BILIRUBINUR NEGATIVE 11/11/2017 1122   KETONESUR NEGATIVE 11/11/2017 1122   PROTEINUR NEGATIVE 11/11/2017 1122   UROBILINOGEN 0.2 08/20/2013 1610   NITRITE NEGATIVE 11/11/2017 1122   LEUKOCYTESUR TRACE (A) 11/11/2017 1122    Radiological Exams on Admission: Dg Chest 2 View  Result Date: 02/03/2018 CLINICAL DATA:  Shortness of breath, cough, COPD EXAM: CHEST - 2 VIEW COMPARISON:  11/11/2017 FINDINGS: Enlargement of cardiac silhouette. Mediastinal contours and pulmonary vascularity normal. Emphysematous changes consistent with COPD. No acute infiltrate, pleural effusion or pneumothorax. Bones demineralized with evidence of prior cervical spine fusion and LEFT shoulder arthroplasty. Again identified sclerosis and significant spur formation at the anterior aspect of the midthoracic  spine. IMPRESSION: Enlargement of cardiac silhouette. COPD changes without acute abnormalities. Electronically Signed   By: Lavonia Dana M.D.   On: 02/03/2018 12:50  EKG: Independently reviewed.  Sinus rhythm chronic left bundle branch block abnormal EKG  Assessment/Plan Principal Problem:   COPD exacerbation (HCC) Active Problems:   Chronic combined systolic and diastolic CHF (congestive heart failure) (HCC)   Essential hypertension   Chronic pain   CAD (coronary artery disease)    -Bronchodilators, steroids, empiric antibiotics, consult respiratory for pulmonary toilet, continue home inhalers -Continue home nocturnal supplemental oxygen  -Admit to telemetry, follow ins and outs and daily weights, restrict fluids.  Cycle cardiac enzymes.  Continue Effient and Entresto.  Euvolemic at this time -Continue home chronic pain regimen advised patient to decrease  her NSAID intake while on blood thinners -Case management consult, physical therapy consult    DVT prophylaxis: Lovenox Code Status: Full) Disposition Plan: Home 1 to 2 days Admission status: Observation telemetry    Haadi Santellan Johnson-Pitts MD [facility] Pager [phone]  If 7PM-7AM, please contact night-coverage [email or web address] Password [facility]  02/03/2018, 4:36 PM

## 2018-02-03 NOTE — ED Triage Notes (Signed)
PT called EMS this morning due to worsening with SOB over the past day. EMS gave 7.5 of albuterol and 0.5 of Atrovent with 125mg  of Solumedrol IV prior to ED arrival. EMS reports pt had expiratory wheezing upon arrival.

## 2018-02-04 DIAGNOSIS — J441 Chronic obstructive pulmonary disease with (acute) exacerbation: Secondary | ICD-10-CM | POA: Diagnosis not present

## 2018-02-04 NOTE — Progress Notes (Signed)
OT Cancellation Note  Patient Details Name: [name] MRN: [id number] DOB: 1935-12-27   Cancelled Treatment:    Reason Eval/Treat Not Completed: OT screened, no needs identified, will sign off. Pt performing ADLs with independence including toileting, grooming, and LB dressing. No further OT services required at this time. Thank you for this recommendation.   Guadelupe Sabin, OTR/L  [phone] 02/04/2018, 9:06 AM

## 2018-02-04 NOTE — Care Management Note (Signed)
Case Management Note  Patient Details  Name: TAMIKO LEOPARD MRN: [id number] Date of Birth: 1935/08/02  Subjective/Objective:     Admitted with COPD. Pt form home, lives alone and is ind with ADL's. Pt has insurance with drug coverage and PCP. Pt drives. pt has daughter who lives nearby and helps if needed and son that lives in [place] and does not come around a lot. Pt has home O2 and neb pta. She says someone called her recently to discuss going to stay somewhere 30-days (under her medicaid). She becomes tearful when talking about this, she does not want to think about leaving her home. She wants to stay independent as long as possible. She has had HH in the past after DC but does not feel she needs it this time.                Action/Plan: DC home tomorrow with self care. No CM needs noted at this time.   Expected Discharge Date:       02/05/18           Expected Discharge Plan:  Home/Self Care  In-House Referral:  NA  Discharge planning Services  CM Consult  Post Acute Care Choice:  NA Choice offered to:  NA  Status of Service:  Completed, signed off  If discussed at Long Length of Stay Meetings, dates discussed:    Additional Comments:  Sherald Barge, RN 02/04/2018, 11:37 AM

## 2018-02-04 NOTE — Care Management Obs Status (Signed)
[facility] NOTIFICATION   Patient Details  Name: [name] MRN: [id number] Date of Birth: 1935-07-30   Medicare Observation Status Notification Given:  Yes    Sherald Barge, RN 02/04/2018, 11:02 AM

## 2018-02-04 NOTE — Progress Notes (Signed)
Subjective: She came to the hospital last night with COPD exacerbation.  She says she is had trouble for about 48 hours.  She is had cough and congestion.  There has been significant problem with pollen in the area for the last week or so and that may have precipitated her problem.  She says she feels better this morning.  No new complaints.  No chest pain.  Objective: Vital signs in last 24 hours: Temp:  [97.9 F (36.6 C)-98.6 F (37 C)] 98.1 F (36.7 C) (09/24 0605) Pulse Rate:  [68-90] 71 (09/24 0605) Resp:  [13-23] 18 (09/24 0605) BP: (100-151)/(53-100) 103/53 (09/24 0605) SpO2:  [88 %-97 %] 90 % (09/24 0734) Weight:  [78.7 kg-79.5 kg] 78.7 kg (09/24 0605) Weight change:  Last BM Date: 02/03/18  Intake/Output from previous day: 09/23 0701 - 09/24 0700 In: 240 [P.O.:240] Out: 400 [Urine:400]  PHYSICAL EXAM General appearance: alert, cooperative and no distress Resp: clear to auscultation bilaterally Cardio: regular rate and rhythm, S1, S2 normal, no murmur, click, rub or gallop GI: soft, non-tender; bowel sounds normal; no masses,  no organomegaly Extremities: extremities normal, atraumatic, no cyanosis or edema  Lab Results:  Results for orders placed or performed during the hospital encounter of 02/03/18 (from the past 48 hour(s))  Brain natriuretic peptide     Status: Abnormal   Collection Time: 02/03/18 11:19 AM  Result Value Ref Range   B Natriuretic Peptide 326.0 (H) 0.0 - 100.0 pg/mL    Comment: Performed at [facility], [address] [place], [place] [address]  Troponin I     Status: None   Collection Time: 02/03/18 11:19 AM  Result Value Ref Range   Troponin I <0.03 <0.03 ng/mL    Comment: Performed at [facility], [address], [place], [place] [address]  Basic metabolic panel     Status: Abnormal   Collection Time: 02/03/18 11:19 AM  Result Value Ref Range   Sodium 142 135 - 145 mmol/L   Potassium 3.9 3.5 - 5.1 mmol/L   Chloride 104 98 - 111 mmol/L   CO2 27 22 - 32 mmol/L   Glucose, Bld 107 (H) 70 - 99 mg/dL   BUN 10 8 - 23 mg/dL   Creatinine, Ser 0.64 0.44 - 1.00 mg/dL   Calcium 9.8 8.9 - 10.3 mg/dL   GFR calc non Af Amer >60 >60 mL/min   GFR calc Af Amer >60 >60 mL/min    Comment: (NOTE) The eGFR has been calculated using the CKD EPI equation. This calculation has not been validated in all clinical situations. eGFR's persistently <60 mL/min signify possible Chronic Kidney Disease.    Anion gap 11 5 - 15    Comment: Performed at [facility], [address], [place], [place] [address]  CBC with Differential     Status: Abnormal   Collection Time: 02/03/18 11:19 AM  Result Value Ref Range   WBC 9.6 4.0 - 10.5 K/uL   RBC 4.85 3.87 - 5.11 MIL/uL   Hemoglobin 13.0 12.0 - 15.0 g/dL   HCT 41.1 36.0 - 46.0 %   MCV 84.7 78.0 - 100.0 fL   MCH 26.8 26.0 - 34.0 pg   MCHC 31.6 30.0 - 36.0 g/dL   RDW 14.0 11.5 - 15.5 %   Platelets 280 150 - 400 K/uL   Neutrophils Relative % 57 %   Neutro Abs 5.4 1.7 - 7.7 K/uL   Lymphocytes Relative 24 %   Lymphs Abs 2.3 0.7 -  4.0 K/uL   Monocytes Relative 7 %   Monocytes Absolute 0.7 0.1 - 1.0 K/uL   Eosinophils Relative 12 %   Eosinophils Absolute 1.2 (H) 0.0 - 0.7 K/uL   Basophils Relative 0 %   Basophils Absolute 0.0 0.0 - 0.1 K/uL    Comment: Performed at [facility], [address], [place], [place] [address]  Troponin I (q 6hr x 3)     Status: None   Collection Time: 02/03/18  4:32 PM  Result Value Ref Range   Troponin I <0.03 <0.03 ng/mL    Comment: Performed at [facility], [address], [place], [place] [address]  Troponin I (q 6hr x 3)     Status: None   Collection Time: 02/03/18  7:18 PM  Result Value Ref Range   Troponin I <0.03 <0.03 ng/mL    Comment: Performed at [facility], [address], [place], [place] [address]    ABGS No results for input(s): PHART, PO2ART, TCO2, HCO3 in the last 72 hours.  Invalid input(s): PCO2 CULTURES No results found for this or any previous  visit (from the past 240 hour(s)). Studies/Results: Dg Chest 2 View  Result Date: 02/03/2018 CLINICAL DATA:  Shortness of breath, cough, COPD EXAM: CHEST - 2 VIEW COMPARISON:  11/11/2017 FINDINGS: Enlargement of cardiac silhouette. Mediastinal contours and pulmonary vascularity normal. Emphysematous changes consistent with COPD. No acute infiltrate, pleural effusion or pneumothorax. Bones demineralized with evidence of prior cervical spine fusion and LEFT shoulder arthroplasty. Again identified sclerosis and significant spur formation at the anterior aspect of the midthoracic spine. IMPRESSION: Enlargement of cardiac silhouette. COPD changes without acute abnormalities. Electronically Signed   By: Lavonia Dana M.D.   On: 02/03/2018 12:50    Medications:  Prior to Admission:  Medications Prior to Admission  Medication Sig Dispense Refill Last Dose  . albuterol (PROVENTIL) (2.5 MG/3ML) 0.083% nebulizer solution Take 3 mLs (2.5 mg total) by nebulization every 6 (six) hours as needed for wheezing or shortness of breath. 75 mL 12 02/03/2018 at Unknown time  . aspirin EC 81 MG tablet Take 81 mg by mouth every morning.    02/03/2018 at Unknown time  . atorvastatin (LIPITOR) 80 MG tablet Take 1 tablet (80 mg total) by mouth daily at 6 PM. 30 tablet 6 02/02/2018 at Unknown time  . carvedilol (COREG) 6.25 MG tablet TAKE ONE TABLET BY MOUTH 2 TIMES A DAY. 180 tablet 3 02/03/2018 at 0700  . clorazepate (TRANXENE) 7.5 MG tablet Take 7.5 mg by mouth daily as needed for anxiety. For nerves   02/03/2018 at Unknown time  . ENTRESTO 24-26 MG TAKE 1 TABLET BY MOUTH TWICE DAILY. 60 tablet 6 02/03/2018 at Unknown time  . meclizine (ANTIVERT) 25 MG tablet Take 1 or 2  Every 6 hours (Patient taking differently: Take 12.5-25 mg by mouth 4 (four) times daily as needed for dizziness. ) 100 tablet 3 02/02/2018 at Unknown time  . naproxen (NAPROSYN) 500 MG tablet Take 500 mg by mouth 2 (two) times daily with a meal.   02/03/2018 at  Unknown time  . nitroGLYCERIN (NITROSTAT) 0.4 MG SL tablet Place 1 tablet (0.4 mg total) under the tongue every 5 (five) minutes x 3 doses as needed for chest pain. 25 tablet 12 unknown  . OXYGEN Inhale 2 L into the lungs every evening.   02/03/2018 at Unknown time  . prasugrel (EFFIENT) 10 MG TABS tablet Take 10 mg by mouth daily.   02/03/2018 at Unknown time  .  PROAIR HFA 108 (90 BASE) MCG/ACT inhaler Inhale 2 puffs into the lungs every 6 (six) hours as needed for wheezing or shortness of breath.    02/03/2018 at Unknown time  . SYMBICORT 160-4.5 MCG/ACT inhaler Inhale 2 puffs into the lungs 2 (two) times daily.   02/03/2018 at Unknown time  . traMADol (ULTRAM) 50 MG tablet Take 50 mg by mouth daily as needed for moderate pain or severe pain. Maximum dose= 8 tablets per day   02/03/2018 at Unknown time  . montelukast (SINGULAIR) 10 MG tablet Take 1 tablet (10 mg total) by mouth at bedtime. (Patient not taking: Reported on 10/11/2017) 30 tablet 0 Not Taking at Unknown time  . predniSONE (DELTASONE) 20 MG tablet Take 2 tablets (40 mg total) by mouth daily. (Patient not taking: Reported on 02/03/2018) 8 tablet 0 Not Taking at Unknown time   Scheduled: . aspirin EC  81 mg Oral q morning - 10a  . atorvastatin  80 mg Oral q1800  . carvedilol  6.25 mg Oral BID WC  . doxycycline  100 mg Oral Q12H  . enoxaparin (LOVENOX) injection  40 mg Subcutaneous Q24H  . mometasone-formoterol  2 puff Inhalation BID  . prasugrel  10 mg Oral Daily  . predniSONE  40 mg Oral Q breakfast  . sacubitril-valsartan  1 tablet Oral BID   Continuous:  [id number] **OR** acetaminophen, albuterol, clorazepate, meclizine, nitroGLYCERIN, ondansetron **OR** ondansetron (ZOFRAN) IV, senna-docusate, traMADol  Assesment: She was admitted with COPD exacerbation.  She does have chronic combined systolic and diastolic heart failure but her problem now seems to have been from her lungs.  She has peripheral arterial disease and is not  felt to be a surgical candidate  She has coronary disease which is stable  She has hypertension which is well controlled Principal Problem:   COPD exacerbation ([place]) Active Problems:   Chronic combined systolic and diastolic CHF (congestive heart failure) (HCC)   Essential hypertension   Chronic pain   CAD (coronary artery disease)    Plan: Continue treatments today.  Probable discharge tomorrow    LOS: 0 days   Abednego Yeates L 02/04/2018, 8:52 AM

## 2018-02-04 NOTE — Evaluation (Signed)
Physical Therapy Evaluation Patient Details Name: [name] MRN: [id number] DOB: [birth year] Today's Date: 02/04/2018   History of Present Illness  [name] is a 82 y.o. female with medical history significant of chronic respiratory failure on nocturnal oxygen, chronic CHF, coronary artery disease presents with dyspnea on exertion.  Over the weekend patient started having increasing dyspnea on exertion.  Today she used her inhalers and did not help her very much.  She states compliance with her medications.  She also describes some chest discomfort all over her chest.  It feels of something laying on her chest.  This discomfort was different from when she had a heart attack.  She denies any fever.  She states she is been extremely fatigued.  Her chronic cough continues but has had increase in her sputum production recently.  Patient lives alone.  She has not had any obvious sick contacts and has not traveled recently.  Patient chest x-ray was nonacute,and  troponin was negative as well.  Her EKG showed her chronic left bundle branch block.     Clinical Impression  Patient functioning near baseline for functional mobility and gait other than coughing during gait training requiring a 30-40 second standing rest break before ambulating back to room, no loss of balance and O2 saturation remaining above 93-94% throughout visit.  Plan:  Patient discharged from physical therapy to care of nursing for ambulation daily as tolerated for length of stay.    Follow Up Recommendations No PT follow up    Equipment Recommendations  None recommended by PT    Recommendations for Other Services       Precautions / Restrictions Precautions Precautions: None Restrictions Weight Bearing Restrictions: No      Mobility  Bed Mobility Overal bed mobility: Modified Independent             General bed mobility comments: has to use bed rail with bed flat  Transfers Overall transfer level: Modified  independent Equipment used: Straight cane                Ambulation/Gait Ambulation/Gait assistance: Modified independent (Device/Increase time) Gait Distance (Feet): 100 Feet Assistive device: Straight cane Gait Pattern/deviations: Step-through pattern;WFL(Within Functional Limits) Gait velocity: decreased   General Gait Details: demonstrates mostly 2 point gait pattern using SPC without loss of balance, mostly limited due to slight SOB with O2 saturation above 94% while  on room air  Stairs            Wheelchair Mobility    Modified Rankin (Stroke Patients Only)       Balance Overall balance assessment: No apparent balance deficits (not formally assessed)                                           Pertinent Vitals/Pain Pain Assessment: Faces Faces Pain Scale: Hurts a little bit Pain Location: chronic left shoulder pain, history of left total shoulder replacement per patient Pain Descriptors / Indicators: Discomfort;Sore Pain Intervention(s): Limited activity within patient's tolerance;Monitored during session    [place] expects to be discharged to:: Private residence Living Arrangements: Alone Available Help at Discharge: Family(daughter) Type of Home: House Home Access: Stairs to enter Entrance Stairs-Rails: None Entrance Stairs-Number of Steps: 1 Home Layout: Two level;Able to live on main level with bedroom/bathroom Home Equipment: Gilford Rile - 2 wheels;Cane - single point;Bedside commode;Shower seat;Wheelchair - Education administrator (  comment)(uses walking stick)      Prior Function Level of Independence: Independent with assistive device(s)         Comments: household and short distanced community ambulator with [facility]     Hand Dominance        Extremity/Trunk Assessment   Upper Extremity Assessment Upper Extremity Assessment: Overall WFL for tasks assessed    Lower Extremity Assessment Lower Extremity Assessment:  Overall WFL for tasks assessed    Cervical / Trunk Assessment Cervical / Trunk Assessment: Normal  Communication   Communication: No difficulties;HOH  Cognition Arousal/Alertness: Awake/alert Behavior During Therapy: WFL for tasks assessed/performed Overall Cognitive Status: Within Functional Limits for tasks assessed                                        General Comments      Exercises     Assessment/Plan    PT Assessment [place] does not need any further PT services  PT Problem List         PT Treatment Interventions      PT Goals (Current goals can be found in the Care Plan section)  Acute Rehab PT Goals Patient Stated Goal: return home PT Goal Formulation: With patient Time For Goal Achievement: 02/04/18 Potential to Achieve Goals: Good    Frequency     Barriers to discharge        Co-evaluation               AM-PAC PT "6 Clicks" Daily Activity  Outcome Measure Difficulty turning over in bed (including adjusting bedclothes, sheets and blankets)?: None Difficulty moving from lying on back to sitting on the side of the bed? : None Difficulty sitting down on and standing up from a chair with arms (e.g., wheelchair, bedside commode, etc,.)?: None Help needed moving to and from a bed to chair (including a wheelchair)?: None Help needed walking in hospital room?: None Help needed climbing 3-5 steps with a railing? : A Little 6 Click Score: 23    End of Session   Activity Tolerance: Patient tolerated treatment well;Patient limited by fatigue Patient left: in chair;with call bell/phone within reach Nurse Communication: Mobility status;Other (comment)(RN notified that patient left up in chair) PT Visit Diagnosis: Unsteadiness on feet (R26.81);Other abnormalities of gait and mobility (R26.89);Muscle weakness (generalized) (M62.81)    Time: 2202-5427 PT Time Calculation (min) (ACUTE ONLY): 25 min   Charges:   PT Evaluation $PT Eval  Moderate Complexity: 1 Mod PT Treatments $Therapeutic Activity: 23-37 mins        9:46 AM, 02/04/18 Lonell Grandchild, MPT Physical Therapist with [facility] 336 [phone] office [phone] mobile phone

## 2018-02-05 DIAGNOSIS — I255 Ischemic cardiomyopathy: Secondary | ICD-10-CM | POA: Diagnosis present

## 2018-02-05 DIAGNOSIS — Z888 Allergy status to other drugs, medicaments and biological substances status: Secondary | ICD-10-CM | POA: Diagnosis not present

## 2018-02-05 DIAGNOSIS — I2510 Atherosclerotic heart disease of native coronary artery without angina pectoris: Secondary | ICD-10-CM | POA: Diagnosis present

## 2018-02-05 DIAGNOSIS — I447 Left bundle-branch block, unspecified: Secondary | ICD-10-CM | POA: Diagnosis present

## 2018-02-05 DIAGNOSIS — Z7982 Long term (current) use of aspirin: Secondary | ICD-10-CM | POA: Diagnosis not present

## 2018-02-05 DIAGNOSIS — I110 Hypertensive heart disease with heart failure: Secondary | ICD-10-CM | POA: Diagnosis present

## 2018-02-05 DIAGNOSIS — M5000 Cervical disc disorder with myelopathy, unspecified cervical region: Secondary | ICD-10-CM | POA: Diagnosis present

## 2018-02-05 DIAGNOSIS — K219 Gastro-esophageal reflux disease without esophagitis: Secondary | ICD-10-CM | POA: Diagnosis present

## 2018-02-05 DIAGNOSIS — Z87891 Personal history of nicotine dependence: Secondary | ICD-10-CM | POA: Diagnosis not present

## 2018-02-05 DIAGNOSIS — Z9981 Dependence on supplemental oxygen: Secondary | ICD-10-CM | POA: Diagnosis not present

## 2018-02-05 DIAGNOSIS — Z801 Family history of malignant neoplasm of trachea, bronchus and lung: Secondary | ICD-10-CM | POA: Diagnosis not present

## 2018-02-05 DIAGNOSIS — I712 Thoracic aortic aneurysm, without rupture: Secondary | ICD-10-CM | POA: Diagnosis present

## 2018-02-05 DIAGNOSIS — Z955 Presence of coronary angioplasty implant and graft: Secondary | ICD-10-CM | POA: Diagnosis not present

## 2018-02-05 DIAGNOSIS — Z833 Family history of diabetes mellitus: Secondary | ICD-10-CM | POA: Diagnosis not present

## 2018-02-05 DIAGNOSIS — Z9049 Acquired absence of other specified parts of digestive tract: Secondary | ICD-10-CM | POA: Diagnosis not present

## 2018-02-05 DIAGNOSIS — R0602 Shortness of breath: Secondary | ICD-10-CM | POA: Diagnosis not present

## 2018-02-05 DIAGNOSIS — Z885 Allergy status to narcotic agent status: Secondary | ICD-10-CM | POA: Diagnosis not present

## 2018-02-05 DIAGNOSIS — Z7951 Long term (current) use of inhaled steroids: Secondary | ICD-10-CM | POA: Diagnosis not present

## 2018-02-05 DIAGNOSIS — F419 Anxiety disorder, unspecified: Secondary | ICD-10-CM | POA: Diagnosis present

## 2018-02-05 DIAGNOSIS — Z90710 Acquired absence of both cervix and uterus: Secondary | ICD-10-CM | POA: Diagnosis not present

## 2018-02-05 DIAGNOSIS — J441 Chronic obstructive pulmonary disease with (acute) exacerbation: Secondary | ICD-10-CM | POA: Diagnosis not present

## 2018-02-05 DIAGNOSIS — I5042 Chronic combined systolic (congestive) and diastolic (congestive) heart failure: Secondary | ICD-10-CM | POA: Diagnosis present

## 2018-02-05 DIAGNOSIS — M1990 Unspecified osteoarthritis, unspecified site: Secondary | ICD-10-CM | POA: Diagnosis present

## 2018-02-05 DIAGNOSIS — Z8249 Family history of ischemic heart disease and other diseases of the circulatory system: Secondary | ICD-10-CM | POA: Diagnosis not present

## 2018-02-05 DIAGNOSIS — I252 Old myocardial infarction: Secondary | ICD-10-CM | POA: Diagnosis not present

## 2018-02-05 MED ORDER — DOXYCYCLINE HYCLATE 100 MG PO CAPS: 100.0000 mg | ORAL_CAPSULE | Freq: Two times a day (BID) | ORAL | 1 refills | Status: DC

## 2018-02-05 MED ORDER — PREDNISONE 10 MG (21) PO TBPK: ORAL_TABLET | ORAL | 1 refills | Status: DC

## 2018-02-05 MED ORDER — NITROGLYCERIN 0.4 MG SL SUBL: 0.4000 mg | SUBLINGUAL_TABLET | SUBLINGUAL | 12 refills | Status: DC | PRN

## 2018-02-05 NOTE — Progress Notes (Signed)
Subjective: She says she feels better.  She has no new complaints.  Her breathing is doing well.  She wants to go home.  Objective: Vital signs in last 24 hours: Temp:  [97.6 F (36.4 C)-98.3 F (36.8 C)] 97.6 F (36.4 C) (09/25 9767) Pulse Rate:  [66-72] 66 (09/25 0608) Resp:  [17-18] 17 (09/25 3419) BP: (124-132)/(43-68) 125/68 (09/25 0608) SpO2:  [93 %-96 %] 94 % (09/25 0756) Weight:  [80.6 kg] 80.6 kg (09/25 3790) Weight change: 1.104 kg Last BM Date: 02/05/18  Intake/Output from previous day: 09/24 0701 - 09/25 0700 In: 480 [P.O.:480] Out: -   PHYSICAL EXAM General appearance: alert, cooperative and no distress Resp: clear to auscultation bilaterally Cardio: regular rate and rhythm, S1, S2 normal, no murmur, click, rub or gallop GI: soft, non-tender; bowel sounds normal; no masses,  no organomegaly Extremities: extremities normal, atraumatic, no cyanosis or edema  Lab Results:  Results for orders placed or performed during the hospital encounter of 02/03/18 (from the past 48 hour(s))  Brain natriuretic peptide     Status: Abnormal   Collection Time: 02/03/18 11:19 AM  Result Value Ref Range   B Natriuretic Peptide 326.0 (H) 0.0 - 100.0 pg/mL    Comment: Performed at [facility], [address] [place], [place] [address]  Troponin I     Status: None   Collection Time: 02/03/18 11:19 AM  Result Value Ref Range   Troponin I <0.03 <0.03 ng/mL    Comment: Performed at [facility], [address], [place], [place] [address]  Basic metabolic panel     Status: Abnormal   Collection Time: 02/03/18 11:19 AM  Result Value Ref Range   Sodium 142 135 - 145 mmol/L   Potassium 3.9 3.5 - 5.1 mmol/L   Chloride 104 98 - 111 mmol/L   CO2 27 22 - 32 mmol/L   Glucose, Bld 107 (H) 70 - 99 mg/dL   BUN 10 8 - 23 mg/dL   Creatinine, Ser 0.64 0.44 - 1.00 mg/dL   Calcium 9.8 8.9 - 10.3 mg/dL   GFR calc non Af Amer >60 >60 mL/min   GFR calc Af Amer >60 >60 mL/min    Comment:  (NOTE) The eGFR has been calculated using the CKD EPI equation. This calculation has not been validated in all clinical situations. eGFR's persistently <60 mL/min signify possible Chronic Kidney Disease.    Anion gap 11 5 - 15    Comment: Performed at [facility], [address], [place], [place] [address]  CBC with Differential     Status: Abnormal   Collection Time: 02/03/18 11:19 AM  Result Value Ref Range   WBC 9.6 4.0 - 10.5 K/uL   RBC 4.85 3.87 - 5.11 MIL/uL   Hemoglobin 13.0 12.0 - 15.0 g/dL   HCT 41.1 36.0 - 46.0 %   MCV 84.7 78.0 - 100.0 fL   MCH 26.8 26.0 - 34.0 pg   MCHC 31.6 30.0 - 36.0 g/dL   RDW 14.0 11.5 - 15.5 %   Platelets 280 150 - 400 K/uL   Neutrophils Relative % 57 %   Neutro Abs 5.4 1.7 - 7.7 K/uL   Lymphocytes Relative 24 %   Lymphs Abs 2.3 0.7 - 4.0 K/uL   Monocytes Relative 7 %   Monocytes Absolute 0.7 0.1 - 1.0 K/uL   Eosinophils Relative 12 %   Eosinophils Absolute 1.2 (H) 0.0 - 0.7 K/uL   Basophils Relative 0 %   Basophils Absolute 0.0  0.0 - 0.1 K/uL    Comment: Performed at [facility], [address] [place], [place] [address]  Troponin I (q 6hr x 3)     Status: None   Collection Time: 02/03/18  4:32 PM  Result Value Ref Range   Troponin I <0.03 <0.03 ng/mL    Comment: Performed at [facility], [address] [place], [place] [address]  Troponin I (q 6hr x 3)     Status: None   Collection Time: 02/03/18  7:18 PM  Result Value Ref Range   Troponin I <0.03 <0.03 ng/mL    Comment: Performed at [facility], [address], [place], [place] [address]    ABGS No results for input(s): PHART, PO2ART, TCO2, HCO3 in the last 72 hours.  Invalid input(s): PCO2 CULTURES No results found for this or any previous visit (from the past 240 hour(s)). Studies/Results: Dg Chest 2 View  Result Date: 02/03/2018 CLINICAL DATA:  Shortness of breath, cough, COPD EXAM: CHEST - 2 VIEW COMPARISON:  11/11/2017 FINDINGS: Enlargement of cardiac silhouette.  Mediastinal contours and pulmonary vascularity normal. Emphysematous changes consistent with COPD. No acute infiltrate, pleural effusion or pneumothorax. Bones demineralized with evidence of prior cervical spine fusion and LEFT shoulder arthroplasty. Again identified sclerosis and significant spur formation at the anterior aspect of the midthoracic spine. IMPRESSION: Enlargement of cardiac silhouette. COPD changes without acute abnormalities. Electronically Signed   By: Lavonia Dana M.D.   On: 02/03/2018 12:50    Medications:  Prior to Admission:  Medications Prior to Admission  Medication Sig Dispense Refill Last Dose  . albuterol (PROVENTIL) (2.5 MG/3ML) 0.083% nebulizer solution Take 3 mLs (2.5 mg total) by nebulization every 6 (six) hours as needed for wheezing or shortness of breath. 75 mL 12 02/03/2018 at Unknown time  . aspirin EC 81 MG tablet Take 81 mg by mouth every morning.    02/03/2018 at Unknown time  . atorvastatin (LIPITOR) 80 MG tablet Take 1 tablet (80 mg total) by mouth daily at 6 PM. 30 tablet 6 02/02/2018 at Unknown time  . carvedilol (COREG) 6.25 MG tablet TAKE ONE TABLET BY MOUTH 2 TIMES A DAY. 180 tablet 3 02/03/2018 at 0700  . clorazepate (TRANXENE) 7.5 MG tablet Take 7.5 mg by mouth daily as needed for anxiety. For nerves   02/03/2018 at Unknown time  . ENTRESTO 24-26 MG TAKE 1 TABLET BY MOUTH TWICE DAILY. 60 tablet 6 02/03/2018 at Unknown time  . meclizine (ANTIVERT) 25 MG tablet Take 1 or 2  Every 6 hours (Patient taking differently: Take 12.5-25 mg by mouth 4 (four) times daily as needed for dizziness. ) 100 tablet 3 02/02/2018 at Unknown time  . naproxen (NAPROSYN) 500 MG tablet Take 500 mg by mouth 2 (two) times daily with a meal.   02/03/2018 at Unknown time  . nitroGLYCERIN (NITROSTAT) 0.4 MG SL tablet Place 1 tablet (0.4 mg total) under the tongue every 5 (five) minutes x 3 doses as needed for chest pain. 25 tablet 12 unknown  . OXYGEN Inhale 2 L into the lungs every evening.    02/03/2018 at Unknown time  . prasugrel (EFFIENT) 10 MG TABS tablet Take 10 mg by mouth daily.   02/03/2018 at Unknown time  . PROAIR HFA 108 (90 BASE) MCG/ACT inhaler Inhale 2 puffs into the lungs every 6 (six) hours as needed for wheezing or shortness of breath.    02/03/2018 at Unknown time  . SYMBICORT 160-4.5 MCG/ACT inhaler Inhale 2 puffs into  the lungs 2 (two) times daily.   02/03/2018 at Unknown time  . traMADol (ULTRAM) 50 MG tablet Take 50 mg by mouth daily as needed for moderate pain or severe pain. Maximum dose= 8 tablets per day   02/03/2018 at Unknown time  . montelukast (SINGULAIR) 10 MG tablet Take 1 tablet (10 mg total) by mouth at bedtime. (Patient not taking: Reported on 10/11/2017) 30 tablet 0 Not Taking at Unknown time  . predniSONE (DELTASONE) 20 MG tablet Take 2 tablets (40 mg total) by mouth daily. (Patient not taking: Reported on 02/03/2018) 8 tablet 0 Not Taking at Unknown time   Scheduled: . aspirin EC  81 mg Oral q morning - 10a  . atorvastatin  80 mg Oral q1800  . carvedilol  6.25 mg Oral BID WC  . doxycycline  100 mg Oral Q12H  . enoxaparin (LOVENOX) injection  40 mg Subcutaneous Q24H  . mometasone-formoterol  2 puff Inhalation BID  . prasugrel  10 mg Oral Daily  . predniSONE  40 mg Oral Q breakfast  . sacubitril-valsartan  1 tablet Oral BID   Continuous:  JKD:[id number] **OR** acetaminophen, albuterol, clorazepate, meclizine, nitroGLYCERIN, ondansetron **OR** ondansetron (ZOFRAN) IV, senna-docusate, traMADol  Assesment: She was admitted with COPD exacerbation.  She is markedly improved.  She has chronic combined systolic and diastolic heart failure but that does not appear to be an active issue now.  She has coronary disease and says she needs a prescription for nitroglycerin  She has hypertension and that is doing pretty well. Principal Problem:   COPD exacerbation ([place]) Active Problems:   Chronic combined systolic and diastolic CHF (congestive heart  failure) (HCC)   Essential hypertension   Chronic pain   COPD (chronic obstructive pulmonary disease) (HCC)   CAD (coronary artery disease)    Plan: Continue treatments.  Discharge home today    LOS: 0 days   Ketan Renz L 02/05/2018, 9:11 AM

## 2018-02-05 NOTE — Discharge Summary (Signed)
Physician Discharge Summary  Patient ID: [name] MRN: [id number] DOB/AGE: [birth year] 82 y.o. Primary Care Physician:Johannah Rozas, Percell Miller, MD Admit date: 02/03/2018 Discharge date: 02/05/2018    Discharge Diagnoses:   Principal Problem:   COPD exacerbation [facility]) Active Problems:   Chronic combined systolic and diastolic CHF (congestive heart failure) (HCC)   Essential hypertension   Chronic pain   COPD (chronic obstructive pulmonary disease) (HCC)   CAD (coronary artery disease)   Allergies as of 02/05/2018      Reactions   Plavix [clopidogrel Bisulfate] Itching   Severe itching   Montelukast Sodium    Pt states she felt a "headache and her eyes started to Burn "    Alprazolam Nausea And Vomiting   Codeine Nausea And Vomiting   Percodan [oxycodone-aspirin] Nausea And Vomiting   Valium Nausea And Vomiting      Medication List    STOP taking these medications   predniSONE 20 MG tablet Commonly known as:  DELTASONE Replaced by:  predniSONE 10 MG (21) Tbpk tablet     TAKE these medications   aspirin EC 81 MG tablet Take 81 mg by mouth every morning.   atorvastatin 80 MG tablet Commonly known as:  LIPITOR Take 1 tablet (80 mg total) by mouth daily at 6 PM.   carvedilol 6.25 MG tablet Commonly known as:  COREG TAKE ONE TABLET BY MOUTH 2 TIMES A DAY.   clorazepate 7.5 MG tablet Commonly known as:  TRANXENE Take 7.5 mg by mouth daily as needed for anxiety. For nerves   doxycycline 100 MG capsule Commonly known as:  VIBRAMYCIN Take 1 capsule (100 mg total) by mouth 2 (two) times daily.   ENTRESTO 24-26 MG Generic drug:  sacubitril-valsartan TAKE 1 TABLET BY MOUTH TWICE DAILY.   meclizine 25 MG tablet Commonly known as:  ANTIVERT Take 1 or 2  Every 6 hours What changed:    how much to take  how to take this  when to take this  reasons to take this  additional instructions   montelukast 10 MG tablet Commonly known as:  SINGULAIR Take 1 tablet (10 mg  total) by mouth at bedtime.   naproxen 500 MG tablet Commonly known as:  NAPROSYN Take 500 mg by mouth 2 (two) times daily with a meal.   nitroGLYCERIN 0.4 MG SL tablet Commonly known as:  NITROSTAT Place 1 tablet (0.4 mg total) under the tongue every 5 (five) minutes x 3 doses as needed for chest pain.   OXYGEN Inhale 2 L into the lungs every evening.   prasugrel 10 MG Tabs tablet Commonly known as:  EFFIENT Take 10 mg by mouth daily.   predniSONE 10 MG (21) Tbpk tablet Commonly known as:  STERAPRED UNI-PAK 21 TAB Take by package instructions Replaces:  predniSONE 20 MG tablet   PROAIR HFA 108 (90 Base) MCG/ACT inhaler Generic drug:  albuterol Inhale 2 puffs into the lungs every 6 (six) hours as needed for wheezing or shortness of breath.   albuterol (2.5 MG/3ML) 0.083% nebulizer solution Commonly known as:  PROVENTIL Take 3 mLs (2.5 mg total) by nebulization every 6 (six) hours as needed for wheezing or shortness of breath.   SYMBICORT 160-4.5 MCG/ACT inhaler Generic drug:  budesonide-formoterol Inhale 2 puffs into the lungs 2 (two) times daily.   traMADol 50 MG tablet Commonly known as:  ULTRAM Take 50 mg by mouth daily as needed for moderate pain or severe pain. Maximum dose= 8 tablets per day  Discharged Condition: Improved    Consults: None  Significant Diagnostic Studies: Dg Chest 2 View  Result Date: 02/03/2018 CLINICAL DATA:  Shortness of breath, cough, COPD EXAM: CHEST - 2 VIEW COMPARISON:  11/11/2017 FINDINGS: Enlargement of cardiac silhouette. Mediastinal contours and pulmonary vascularity normal. Emphysematous changes consistent with COPD. No acute infiltrate, pleural effusion or pneumothorax. Bones demineralized with evidence of prior cervical spine fusion and LEFT shoulder arthroplasty. Again identified sclerosis and significant spur formation at the anterior aspect of the midthoracic spine. IMPRESSION: Enlargement of cardiac silhouette. COPD  changes without acute abnormalities. Electronically Signed   By: Lavonia Dana M.D.   On: 02/03/2018 12:50    Lab Results: Basic Metabolic Panel: Recent Labs    02/03/18 1119  NA 142  K 3.9  CL 104  CO2 27  GLUCOSE 107*  BUN 10  CREATININE 0.64  CALCIUM 9.8   Liver Function Tests: No results for input(s): AST, ALT, ALKPHOS, BILITOT, PROT, ALBUMIN in the last 72 hours.   CBC: Recent Labs    02/03/18 1119  WBC 9.6  NEUTROABS 5.4  HGB 13.0  HCT 41.1  MCV 84.7  PLT 280    No results found for this or any previous visit (from the past 240 hour(s)).   Hospital Course: This is an 82 year old with known COPD who came to the emergency department because of increasing shortness of breath.  She was treated in the emergency department but was not well enough to be discharged so she was admitted and started on antibiotics IV steroids inhaled bronchodilators and improved markedly over the next 48 hours or so.  By the time of discharge she was back at baseline.  She was not wheezing or coughing.  She was able to ambulate in the halls.  Discharge Exam: Blood pressure 125/68, pulse 66, temperature 97.6 F (36.4 C), temperature source Oral, resp. rate 17, height 5\' 1"  (1.549 m), weight 80.6 kg, SpO2 94 %. She is awake and alert.  Her chest is clear.  Her heart is regular.  She is in no distress.  Disposition: Home she does not want home health services      Signed: Merikay Lesniewski L   02/05/2018, 9:17 AM

## 2018-02-05 NOTE — Progress Notes (Signed)
Patient states understanding of discharge instructions.  

## 2018-02-10 ENCOUNTER — Other Ambulatory Visit: Payer: Self-pay

## 2018-02-10 NOTE — Patient Outreach (Signed)
[facility]) Care Management  02/10/2018  [name] 11/11/1935 [id number]     EMMI-General Discharge RED ON EMMI ALERT Day # 1 Date: 02/07/18 Red Alert Reason: "Scheduled follow up? No"   Outreach attempt # 1 to patient. Spoke with patient who was very [place] and requested RN CM keep call brief as she was unable to hear good. Reviewed and addressed red alert with patient. She confirmed that hospital staff made cardiology follow up appt for 03/18/18. However, she states that no one made PCP follow up nor was she was advised to do so. Patient states that normally she always follows up with PCP after hospitalization and inquired if she should do so. RN CM encouraged patient to alert PCP office of recent hospitalization and to request follow up appt. Patient voices understanding and was able to confirm that she has contact info for PCP and will call office today. She reports that she is driving and will be able to take herself to appts. She denies any issues or concerns regarding her meds. Advised patient that they would get one more automated EMMI-GENERAL post discharge calls to assess how they are doing following recent hospitalization and will receive a call from a nurse if any of their responses were abnormal. Patient voiced understanding and was appreciative of f/u call.       Plan: RN CM will close case at this time as no further interventions needed.    Enzo Montgomery, RN,BSN,CCM [place] [facility] Telephonic Care Management Coordinator Direct Phone: [phone] Toll Free: [phone] Fax: [phone]

## 2018-02-11 ENCOUNTER — Other Ambulatory Visit: Payer: Self-pay

## 2018-02-11 NOTE — Patient Outreach (Signed)
[facility]) Care Management  02/11/2018  [name] 01-11-1936 [id number]     EMMI-General Discharge RED ON EMMI ALERT Day # 4 Date: 02/10/18 Red Alert Reason: " Transportation to follow up? No  Sad/hopeless/anxious/empty? Yes"    Red on EMMI dashboard received. No outreach call warranted to patient at this time. RN CM addressed issue on previous call.      Plan: RN CM will close case at this time.    Enzo Montgomery, RN,BSN,CCM [place] [facility] Telephonic Care Management Coordinator Direct Phone: [phone] Toll Free: [phone] Fax: [phone]

## 2018-03-18 ENCOUNTER — Encounter: Payer: Self-pay | Admitting: Cardiovascular Disease

## 2018-03-18 ENCOUNTER — Ambulatory Visit (INDEPENDENT_AMBULATORY_CARE_PROVIDER_SITE_OTHER): Payer: Medicare Other | Admitting: Cardiovascular Disease

## 2018-03-18 VITALS — BP 120/70 | HR 77 | Ht 62.0 in | Wt 173.0 lb

## 2018-03-18 DIAGNOSIS — E785 Hyperlipidemia, unspecified: Secondary | ICD-10-CM | POA: Diagnosis not present

## 2018-03-18 DIAGNOSIS — I255 Ischemic cardiomyopathy: Secondary | ICD-10-CM | POA: Diagnosis not present

## 2018-03-18 DIAGNOSIS — Z955 Presence of coronary angioplasty implant and graft: Secondary | ICD-10-CM

## 2018-03-18 DIAGNOSIS — I10 Essential (primary) hypertension: Secondary | ICD-10-CM

## 2018-03-18 DIAGNOSIS — I5042 Chronic combined systolic (congestive) and diastolic (congestive) heart failure: Secondary | ICD-10-CM

## 2018-03-18 DIAGNOSIS — I712 Thoracic aortic aneurysm, without rupture: Secondary | ICD-10-CM | POA: Diagnosis not present

## 2018-03-18 DIAGNOSIS — I25118 Atherosclerotic heart disease of native coronary artery with other forms of angina pectoris: Secondary | ICD-10-CM | POA: Diagnosis not present

## 2018-03-18 DIAGNOSIS — I7121 Aneurysm of the ascending aorta, without rupture: Secondary | ICD-10-CM

## 2018-03-18 NOTE — Progress Notes (Signed)
SUBJECTIVE: The patient presents for routine follow-up.  She was hospitalized for a COPD exacerbation in September 2019.  She underwent drug-eluting stent placement to the proximal and mid LAD as well as the first marginal branch on 11/23/16.  Echocardiogram 11/24/16: Mildly reduced left ventricular systolic function, LVEF 62-83%, diffuse hypokinesis, grade 1 diastolic dysfunction, moderate aortic regurgitation.  She is doing fairly well overall.  She has chronic exertional dyspnea and is recently getting over a cold.  She has chest pains from time to time alleviated with deep breathing.  She has not had use nitroglycerin.  She admits to being anxious.  She lives by herself.    Review of Systems: As per "subjective", otherwise negative.  Allergies  Allergen Reactions  . Plavix [Clopidogrel Bisulfate] Itching    Severe itching  . Montelukast Sodium     Pt states she felt a "headache and her eyes started to Burn "   . Alprazolam Nausea And Vomiting  . Codeine Nausea And Vomiting  . Percodan [Oxycodone-Aspirin] Nausea And Vomiting  . Valium Nausea And Vomiting    Current Outpatient Medications  Medication Sig Dispense Refill  . albuterol (PROVENTIL) (2.5 MG/3ML) 0.083% nebulizer solution Take 3 mLs (2.5 mg total) by nebulization every 6 (six) hours as needed for wheezing or shortness of breath. 75 mL 12  . aspirin EC 81 MG tablet Take 81 mg by mouth every morning.     Marland Kitchen atorvastatin (LIPITOR) 80 MG tablet Take 1 tablet (80 mg total) by mouth daily at 6 PM. 30 tablet 6  . carvedilol (COREG) 6.25 MG tablet TAKE ONE TABLET BY MOUTH 2 TIMES A DAY. 180 tablet 3  . clorazepate (TRANXENE) 7.5 MG tablet Take 7.5 mg by mouth daily as needed for anxiety. For nerves    . ENTRESTO 24-26 MG TAKE 1 TABLET BY MOUTH TWICE DAILY. 60 tablet 6  . meclizine (ANTIVERT) 25 MG tablet Take 1 or 2  Every 6 hours (Patient taking differently: Take 12.5-25 mg by mouth 4 (four) times daily as needed for  dizziness. ) 100 tablet 3  . montelukast (SINGULAIR) 10 MG tablet Take 1 tablet (10 mg total) by mouth at bedtime. 30 tablet 0  . naproxen (NAPROSYN) 500 MG tablet Take 500 mg by mouth 2 (two) times daily with a meal.    . nitroGLYCERIN (NITROSTAT) 0.4 MG SL tablet Place 1 tablet (0.4 mg total) under the tongue every 5 (five) minutes x 3 doses as needed for chest pain. 25 tablet 12  . OXYGEN Inhale 2 L into the lungs every evening.    . prasugrel (EFFIENT) 10 MG TABS tablet Take 10 mg by mouth daily.    Marland Kitchen PROAIR HFA 108 (90 BASE) MCG/ACT inhaler Inhale 2 puffs into the lungs every 6 (six) hours as needed for wheezing or shortness of breath.     . SYMBICORT 160-4.5 MCG/ACT inhaler Inhale 2 puffs into the lungs 2 (two) times daily.    . traMADol (ULTRAM) 50 MG tablet Take 50 mg by mouth daily as needed for moderate pain or severe pain. Maximum dose= 8 tablets per day     No current facility-administered medications for this visit.     Past Medical History:  Diagnosis Date  . Anxiety   . Cervical disc disorder with myelopathy, unspecified cervical region   . Chronic systolic (congestive) heart failure ([place])   . COPD (chronic obstructive pulmonary disease) ([place])   . Essential hypertension   . Hemorrhoids   .  Liver mass   . Lung, cysts, congenital    Left lung cyst  . Myocardial infarction ([place])   . Nephrolithiasis    Embedded  . Nonischemic cardiomyopathy ([place])    LVEF 35-40% 2015  . On home O2   . Osteoarthritis   . Thoracic ascending aortic aneurysm ([facility])    4.3 cm April 2016    Past Surgical History:  Procedure Laterality Date  . Benign breast tumors    . CHOLECYSTECTOMY    . COLONOSCOPY    . COLONOSCOPY N/A 09/22/2014   Procedure: COLONOSCOPY;  Surgeon: Rogene Houston, MD;  [facility];  Service: Endoscopy;  Laterality: N/A;  830 -- to be done in OR under fluoro  . Complete hysterectomy    . CORONARY STENT INTERVENTION N/A 11/23/2016   Procedure: Coronary Stent  Intervention;  Surgeon: Martinique, Peter M, MD;  Location: [facility];  Service: Cardiovascular;  Laterality: N/A;  . LEFT HEART CATH AND CORONARY ANGIOGRAPHY N/A 11/23/2016   Procedure: Left Heart Cath and Coronary Angiography;  Surgeon: Martinique, Peter M, MD;  Location: [facility];  Service: Cardiovascular;  Laterality: N/A;  . TONSILLECTOMY AND ADENOIDECTOMY      Social History   Socioeconomic History  . Marital status: Widowed    Spouse name: Not on file  . Number of children: Not on file  . Years of education: 9th  . Highest education level: Not on file  Occupational History    Employer: RETIRED  Social Needs  . Financial resource strain: Not on file  . Food insecurity:    Worry: Not on file    Inability: Not on file  . Transportation needs:    Medical: Not on file    Non-medical: Not on file  Tobacco Use  . Smoking status: Former Smoker    Packs/day: 0.25    Years: 31.00    Pack years: 7.75    Types: Cigarettes    Start date: 05/14/1977    Last attempt to quit: 05/28/2008    Years since quitting: 9.8  . Smokeless tobacco: Never Used  Substance and Sexual Activity  . Alcohol use: No    Alcohol/week: 0.0 standard drinks  . Drug use: No  . Sexual activity: Never  Lifestyle  . Physical activity:    Days per week: Not on file    Minutes per session: Not on file  . Stress: Not on file  Relationships  . Social connections:    Talks on phone: Not on file    Gets together: Not on file    Attends religious service: Not on file    Active member of club or organization: Not on file    Attends meetings of clubs or organizations: Not on file    Relationship status: Not on file  . Intimate partner violence:    Fear of current or ex partner: Not on file    Emotionally abused: Not on file    Physically abused: Not on file    Forced sexual activity: Not on file  Other Topics Concern  . Not on file  Social History Narrative  . Not on file     Vitals:    03/18/18 1035  BP: 120/70  Pulse: 77  SpO2: 90%  Weight: 173 lb (78.5 kg)  Height: 5\' 2"  (1.575 m)    Wt Readings from Last 3 Encounters:  03/18/18 173 lb (78.5 kg)  02/05/18 177 lb 11.2 oz (80.6 kg)  11/25/17  175 lb (79.4 kg)     PHYSICAL EXAM General: NAD HEENT: Normal. Neck: No JVD, no thyromegaly. Lungs: Diminished sounds throughout, no crackles or wheezes. CV: Regular rate and rhythm, normal S1/S2, no S3/S4, no murmur. No pretibial or periankle edema.  No carotid bruit.   Abdomen: Soft, nontender, no distention.  Neurologic: Alert and oriented.  Psych: Normal affect. Skin: Normal. Musculoskeletal: No gross deformities.    ECG: Reviewed above under Subjective   Labs: Lab Results  Component Value Date/Time   K 3.9 02/03/2018 11:19 AM   BUN 10 02/03/2018 11:19 AM   CREATININE 0.64 02/03/2018 11:19 AM   CREATININE 0.72 08/29/2016 12:42 PM   ALT 10 11/11/2017 10:26 AM   HGB 13.0 02/03/2018 11:19 AM     Lipids: Lab Results  Component Value Date/Time   LDLCALC 115 (H) 11/23/2016 07:37 AM   CHOL 204 (H) 11/23/2016 07:37 AM   TRIG 50 11/23/2016 07:37 AM   HDL 79 11/23/2016 07:37 AM       ASSESSMENT AND PLAN:  1. Coronary artery disease with stenting of the LAD and OM1 in July 2018: Symptomatically stable.   Continue aspirin. Brilinta presumably led to increased shortness of breath from baseline in the past. She was switched to Plavix but this led to severe itching. She tolerated Effient without difficulty. Continue carvedilol and Lipitor. She is on an angiotensin receptor blocker given her history of non-STEMI. As she has continued to take Effient, I will make sure this is discontinued.  2. Chronic hypertension: Blood pressure is normal. No changes to therapy.  3. Thoracic aortic aneurysm: Stable at 4.2 cm by CT angiography of the chest on 11/11/2017.  4. Chronic systolic heart failure: Euvolemic and stable. Continue carvedilol and Entresto.No  diuretic requirement at this time.  5.  Hyperlipidemia: LDL 115 on 11/23/2016.  I will request a copy of most recent lipids from PCP.   Disposition: Follow up 6 months   Kate Sable, M.D., F.A.C.C.

## 2018-03-18 NOTE — Patient Instructions (Signed)
Medication Instructions:   STOP Effient  If you need a refill on your cardiac medications before your next appointment, please call your pharmacy.   Lab work: None If you have labs (blood work) drawn today and your tests are completely normal, you will receive your results only by: Marland Kitchen MyChart Message (if you have MyChart) OR . A paper copy in the mail If you have any lab test that is abnormal or we need to change your treatment, we will call you to review the results.  Testing/Procedures: None  Follow-Up: At [facility], you and your health needs are our priority.  As part of our continuing mission to provide you with exceptional heart care, we have created designated Provider Care Teams.  These Care Teams include your primary Cardiologist (physician) and Advanced Practice Providers (APPs -  Physician Assistants and Nurse Practitioners) who all work together to provide you with the care you need, when you need it. You will need a follow up appointment in 6 months.  Please call our office 2 months in advance to schedule this appointment.  You may see Kate Sable, MD or one of the following Advanced Practice Providers on your designated Care Team:   Bernerd Pho, PA-C [facility]) . Ermalinda Barrios, PA-C ([place])  Any Other Special Instructions Will Be Listed Below (If Applicable). NONE

## 2018-03-31 ENCOUNTER — Telehealth: Payer: Self-pay

## 2018-03-31 DIAGNOSIS — E782 Mixed hyperlipidemia: Secondary | ICD-10-CM

## 2018-03-31 NOTE — Telephone Encounter (Signed)
LM for pt to call back-cc, lab slip at front desk

## 2018-04-01 ENCOUNTER — Other Ambulatory Visit (HOSPITAL_COMMUNITY)
Admission: RE | Admit: 2018-04-01 | Discharge: 2018-04-01 | Disposition: A | Discharge status: Home / Self Care | Payer: Medicare Other | Source: Ambulatory Visit | Attending: Cardiovascular Disease | Admitting: Cardiovascular Disease

## 2018-04-01 DIAGNOSIS — E782 Mixed hyperlipidemia: Secondary | ICD-10-CM | POA: Insufficient documentation

## 2018-04-01 LAB — LIPID PANEL
Cholesterol: 228 mg/dL — ABNORMAL HIGH (ref 0–200)
HDL: 83 mg/dL (ref >40)
LDL Cholesterol: 130 mg/dL — ABNORMAL HIGH (ref 0–99)
Total CHOL/HDL Ratio: 2.7 RATIO
Triglycerides: 74 mg/dL (ref <150)
VLDL: 15 mg/dL (ref 0–40)

## 2018-04-04 ENCOUNTER — Telehealth: Payer: Self-pay | Admitting: Student

## 2018-04-04 NOTE — Telephone Encounter (Signed)
I will forward to [facility] PA-C to result and then I will  call patient  Ok to [facility] on home machine

## 2018-04-04 NOTE — Telephone Encounter (Signed)
Calling for results of lab work / tg

## 2018-04-07 ENCOUNTER — Telehealth: Payer: Self-pay

## 2018-04-07 NOTE — Telephone Encounter (Signed)
Central states pt has not taken lipitor since February , will  Take as directed now

## 2018-04-07 NOTE — Telephone Encounter (Signed)
-----   Message from [place], [place] sent at 04/06/2018  9:48 PM EST ----- Covering for Dr. Bronson Ing, total cholesterol and LDL (bad cholesterol) remain quite elevated, similar result when compare to a year ago, please double check with patient to see if she has been taking lipitor 80mg  daily, if so, she will need to be referred to lipid clinic to consider PCSK 9 inhibitor

## 2018-04-07 NOTE — Telephone Encounter (Signed)
t not sure if she is taking atorvastatin, I will call pharmacy at 9 am

## 2018-04-07 NOTE — Telephone Encounter (Signed)
See lab result

## 2018-04-24 ENCOUNTER — Encounter (HOSPITAL_COMMUNITY): Payer: Self-pay

## 2018-04-24 ENCOUNTER — Other Ambulatory Visit: Payer: Self-pay

## 2018-04-24 ENCOUNTER — Inpatient Hospital Stay (HOSPITAL_COMMUNITY)
Admission: EM | Admit: 2018-04-24 | Discharge: 2018-04-28 | DRG: 190 | Disposition: A | Discharge status: Home Health | Payer: Medicare Other | Attending: Pulmonary Disease | Admitting: Pulmonary Disease

## 2018-04-24 ENCOUNTER — Emergency Department (HOSPITAL_COMMUNITY): Payer: Medicare Other

## 2018-04-24 DIAGNOSIS — R0602 Shortness of breath: Secondary | ICD-10-CM | POA: Diagnosis not present

## 2018-04-24 DIAGNOSIS — I110 Hypertensive heart disease with heart failure: Secondary | ICD-10-CM | POA: Diagnosis present

## 2018-04-24 DIAGNOSIS — Z9049 Acquired absence of other specified parts of digestive tract: Secondary | ICD-10-CM

## 2018-04-24 DIAGNOSIS — J9621 Acute and chronic respiratory failure with hypoxia: Secondary | ICD-10-CM

## 2018-04-24 DIAGNOSIS — F419 Anxiety disorder, unspecified: Secondary | ICD-10-CM | POA: Diagnosis not present

## 2018-04-24 DIAGNOSIS — J449 Chronic obstructive pulmonary disease, unspecified: Secondary | ICD-10-CM | POA: Diagnosis present

## 2018-04-24 DIAGNOSIS — I5042 Chronic combined systolic (congestive) and diastolic (congestive) heart failure: Secondary | ICD-10-CM | POA: Diagnosis not present

## 2018-04-24 DIAGNOSIS — Z87891 Personal history of nicotine dependence: Secondary | ICD-10-CM

## 2018-04-24 DIAGNOSIS — I208 Other forms of angina pectoris: Secondary | ICD-10-CM

## 2018-04-24 DIAGNOSIS — I10 Essential (primary) hypertension: Secondary | ICD-10-CM | POA: Diagnosis not present

## 2018-04-24 DIAGNOSIS — R05 Cough: Secondary | ICD-10-CM | POA: Diagnosis not present

## 2018-04-24 DIAGNOSIS — Z90710 Acquired absence of both cervix and uterus: Secondary | ICD-10-CM

## 2018-04-24 DIAGNOSIS — R5381 Other malaise: Secondary | ICD-10-CM

## 2018-04-24 DIAGNOSIS — I712 Thoracic aortic aneurysm, without rupture: Secondary | ICD-10-CM | POA: Diagnosis present

## 2018-04-24 DIAGNOSIS — R42 Dizziness and giddiness: Secondary | ICD-10-CM | POA: Diagnosis not present

## 2018-04-24 DIAGNOSIS — Z888 Allergy status to other drugs, medicaments and biological substances status: Secondary | ICD-10-CM

## 2018-04-24 DIAGNOSIS — I255 Ischemic cardiomyopathy: Secondary | ICD-10-CM | POA: Diagnosis present

## 2018-04-24 DIAGNOSIS — I25118 Atherosclerotic heart disease of native coronary artery with other forms of angina pectoris: Secondary | ICD-10-CM | POA: Diagnosis present

## 2018-04-24 DIAGNOSIS — Z7951 Long term (current) use of inhaled steroids: Secondary | ICD-10-CM

## 2018-04-24 DIAGNOSIS — Z7982 Long term (current) use of aspirin: Secondary | ICD-10-CM

## 2018-04-24 DIAGNOSIS — Z833 Family history of diabetes mellitus: Secondary | ICD-10-CM

## 2018-04-24 DIAGNOSIS — Z23 Encounter for immunization: Secondary | ICD-10-CM

## 2018-04-24 DIAGNOSIS — R062 Wheezing: Secondary | ICD-10-CM | POA: Diagnosis not present

## 2018-04-24 DIAGNOSIS — Z79899 Other long term (current) drug therapy: Secondary | ICD-10-CM

## 2018-04-24 DIAGNOSIS — Z955 Presence of coronary angioplasty implant and graft: Secondary | ICD-10-CM

## 2018-04-24 DIAGNOSIS — Z87442 Personal history of urinary calculi: Secondary | ICD-10-CM

## 2018-04-24 DIAGNOSIS — Z9981 Dependence on supplemental oxygen: Secondary | ICD-10-CM

## 2018-04-24 DIAGNOSIS — Z8249 Family history of ischemic heart disease and other diseases of the circulatory system: Secondary | ICD-10-CM

## 2018-04-24 DIAGNOSIS — Z885 Allergy status to narcotic agent status: Secondary | ICD-10-CM

## 2018-04-24 DIAGNOSIS — I447 Left bundle-branch block, unspecified: Secondary | ICD-10-CM | POA: Diagnosis not present

## 2018-04-24 DIAGNOSIS — J441 Chronic obstructive pulmonary disease with (acute) exacerbation: Secondary | ICD-10-CM | POA: Diagnosis not present

## 2018-04-24 DIAGNOSIS — I252 Old myocardial infarction: Secondary | ICD-10-CM

## 2018-04-24 DIAGNOSIS — Z801 Family history of malignant neoplasm of trachea, bronchus and lung: Secondary | ICD-10-CM

## 2018-04-24 DIAGNOSIS — R079 Chest pain, unspecified: Secondary | ICD-10-CM | POA: Diagnosis not present

## 2018-04-24 DIAGNOSIS — G8929 Other chronic pain: Secondary | ICD-10-CM | POA: Diagnosis present

## 2018-04-24 DIAGNOSIS — K219 Gastro-esophageal reflux disease without esophagitis: Secondary | ICD-10-CM | POA: Diagnosis present

## 2018-04-24 DIAGNOSIS — Z79891 Long term (current) use of opiate analgesic: Secondary | ICD-10-CM

## 2018-04-24 HISTORY — DX: Acute and chronic respiratory failure with hypoxia: J96.21

## 2018-04-24 LAB — CBC WITH DIFFERENTIAL/PLATELET
Abs Immature Granulocytes: 0.02 10*3/uL (ref 0.00–0.07)
Basophils Absolute: 0 10*3/uL (ref 0.0–0.1)
Basophils Relative: 0 %
Eosinophils Absolute: 0.4 10*3/uL (ref 0.0–0.5)
Eosinophils Relative: 4 %
HCT: 40.2 % (ref 36.0–46.0)
Hemoglobin: 12.2 g/dL (ref 12.0–15.0)
Immature Granulocytes: 0 %
Lymphocytes Relative: 13 %
Lymphs Abs: 1.3 10*3/uL (ref 0.7–4.0)
MCH: 25.9 pg — ABNORMAL LOW (ref 26.0–34.0)
MCHC: 30.3 g/dL (ref 30.0–36.0)
MCV: 85.4 fL (ref 80.0–100.0)
Monocytes Absolute: 0.3 10*3/uL (ref 0.1–1.0)
Monocytes Relative: 3 %
Neutro Abs: 7.5 10*3/uL (ref 1.7–7.7)
Neutrophils Relative %: 80 %
Platelets: 263 10*3/uL (ref 150–400)
RBC: 4.71 MIL/uL (ref 3.87–5.11)
RDW: 13.7 % (ref 11.5–15.5)
WBC: 9.5 10*3/uL (ref 4.0–10.5)
nRBC: 0 % (ref 0.0–0.2)

## 2018-04-24 LAB — HEPATIC FUNCTION PANEL
ALT: 11 U/L (ref 0–44)
AST: 19 U/L (ref 15–41)
Albumin: 4 g/dL (ref 3.5–5.0)
Alkaline Phosphatase: 58 U/L (ref 38–126)
Bilirubin, Direct: 0.1 mg/dL (ref 0.0–0.2)
Indirect Bilirubin: 0.9 mg/dL (ref 0.3–0.9)
Total Bilirubin: 1 mg/dL (ref 0.3–1.2)
Total Protein: 6.6 g/dL (ref 6.5–8.1)

## 2018-04-24 LAB — TROPONIN I
Troponin I: <0.03 ng/mL (ref <0.03)
Troponin I: <0.03 ng/mL (ref <0.03)

## 2018-04-24 LAB — BASIC METABOLIC PANEL
Anion gap: 7 (ref 5–15)
BUN: 12 mg/dL (ref 8–23)
CO2: 30 mmol/L (ref 22–32)
Calcium: 9.8 mg/dL (ref 8.9–10.3)
Chloride: 105 mmol/L (ref 98–111)
Creatinine, Ser: 0.66 mg/dL (ref 0.44–1.00)
GFR calc Af Amer: >60 mL/min (ref >60)
GFR calc non Af Amer: >60 mL/min (ref >60)
Glucose, Bld: 118 mg/dL — ABNORMAL HIGH (ref 70–99)
Potassium: 4.2 mmol/L (ref 3.5–5.1)
Sodium: 142 mmol/L (ref 135–145)

## 2018-04-24 MED ORDER — ASPIRIN EC 81 MG PO TBEC
81.0000 mg | DELAYED_RELEASE_TABLET | ORAL | Status: DC
  Administered 2018-04-25 – 2018-04-28 (×4): 81 mg via ORAL
  Filled 2018-04-24 (×5): qty 1

## 2018-04-24 MED ORDER — ENOXAPARIN SODIUM 40 MG/0.4ML ~~LOC~~ SOLN
40.0000 mg | SUBCUTANEOUS | Status: DC
  Administered 2018-04-24 – 2018-04-27 (×4): 40 mg via SUBCUTANEOUS
  Filled 2018-04-24 (×4): qty 0.4

## 2018-04-24 MED ORDER — SACUBITRIL-VALSARTAN 24-26 MG PO TABS
1.0000 | ORAL_TABLET | Freq: Two times a day (BID) | ORAL | Status: DC
  Administered 2018-04-25 – 2018-04-28 (×7): 1 via ORAL
  Filled 2018-04-24 (×20): qty 1

## 2018-04-24 MED ORDER — IPRATROPIUM-ALBUTEROL 0.5-2.5 (3) MG/3ML IN SOLN
3.0000 mL | Freq: Four times a day (QID) | RESPIRATORY_TRACT | Status: DC
  Administered 2018-04-24 – 2018-04-28 (×14): 3 mL via RESPIRATORY_TRACT
  Filled 2018-04-24 (×14): qty 3

## 2018-04-24 MED ORDER — NITROGLYCERIN 0.4 MG SL SUBL: 0.4000 mg | SUBLINGUAL_TABLET | SUBLINGUAL | Status: DC | PRN

## 2018-04-24 MED ORDER — AZITHROMYCIN 250 MG PO TABS
250.0000 mg | ORAL_TABLET | Freq: Every day | ORAL | Status: DC
  Administered 2018-04-25 – 2018-04-28 (×4): 250 mg via ORAL
  Filled 2018-04-24 (×4): qty 1

## 2018-04-24 MED ORDER — PREDNISONE 20 MG PO TABS
40.0000 mg | ORAL_TABLET | Freq: Every day | ORAL | Status: DC
  Filled 2018-04-24: qty 2

## 2018-04-24 MED ORDER — CLORAZEPATE DIPOTASSIUM 7.5 MG PO TABS: 7.5000 mg | ORAL_TABLET | Freq: Three times a day (TID) | ORAL | Status: DC | PRN

## 2018-04-24 MED ORDER — AZITHROMYCIN 250 MG PO TABS
500.0000 mg | ORAL_TABLET | Freq: Once | ORAL | Status: AC
  Administered 2018-04-24: 500 mg via ORAL
  Filled 2018-04-24: qty 2

## 2018-04-24 MED ORDER — CARVEDILOL 3.125 MG PO TABS
6.2500 mg | ORAL_TABLET | Freq: Two times a day (BID) | ORAL | Status: DC
  Administered 2018-04-24 – 2018-04-28 (×8): 6.25 mg via ORAL
  Filled 2018-04-24 (×8): qty 2

## 2018-04-24 MED ORDER — ALBUTEROL SULFATE (2.5 MG/3ML) 0.083% IN NEBU
2.5000 mg | INHALATION_SOLUTION | Freq: Once | RESPIRATORY_TRACT | Status: AC
  Administered 2018-04-24: 2.5 mg via RESPIRATORY_TRACT
  Filled 2018-04-24: qty 3

## 2018-04-24 MED ORDER — SODIUM CHLORIDE 0.9 % IV SOLN: 1.0000 g | Freq: Three times a day (TID) | INTRAVENOUS | Status: DC

## 2018-04-24 MED ORDER — TRAMADOL HCL 50 MG PO TABS: 50.0000 mg | ORAL_TABLET | Freq: Three times a day (TID) | ORAL | Status: DC | PRN

## 2018-04-24 MED ORDER — METHYLPREDNISOLONE SODIUM SUCC 125 MG IJ SOLR
125.0000 mg | Freq: Once | INTRAMUSCULAR | Status: AC
  Administered 2018-04-24: 125 mg via INTRAVENOUS
  Filled 2018-04-24: qty 2

## 2018-04-24 MED ORDER — IPRATROPIUM-ALBUTEROL 0.5-2.5 (3) MG/3ML IN SOLN
3.0000 mL | Freq: Once | RESPIRATORY_TRACT | Status: DC
  Administered 2018-04-24: 3 mL via RESPIRATORY_TRACT

## 2018-04-24 MED ORDER — IPRATROPIUM-ALBUTEROL 0.5-2.5 (3) MG/3ML IN SOLN
3.0000 mL | Freq: Once | RESPIRATORY_TRACT | Status: AC
  Administered 2018-04-24: 3 mL via RESPIRATORY_TRACT
  Filled 2018-04-24: qty 3

## 2018-04-24 MED ORDER — ATORVASTATIN CALCIUM 40 MG PO TABS
80.0000 mg | ORAL_TABLET | Freq: Every day | ORAL | Status: DC
  Administered 2018-04-24 – 2018-04-27 (×4): 80 mg via ORAL
  Filled 2018-04-24 (×4): qty 2

## 2018-04-24 NOTE — ED Notes (Signed)
Patient ambulatory to restroom without oxygen.  Patient had audible wheezing but o2 sat back in room was 91%

## 2018-04-24 NOTE — ED Triage Notes (Signed)
EMS reports pt c/o sob and cough since yesterday.  Cough productive with green sputum.  PT used neb treatment at home without relief.  EMS arrived and gave 5mg  albuterol and 0.5mg  atrovent neb.

## 2018-04-24 NOTE — H&P (Signed)
History and Physical    [name] MPN:[id number] DOB: [birth year] DOA: 04/24/2018  PCP: Sinda Du, MD Patient coming from: Home  Chief Complaint: SOB  HPI: [name] is a 82 y.o. female with medical history significant of  COPD, anxiety, congestive heart failure, hypertension, CAD presenting to the hospital for evaluation of shortness of breath.  Patient reports having shortness of breath for the past 10 days.  States she is now short of breath all the time.  She was previously using 2 L home oxygen only at night but now is requiring it during daytime as well.  She has been coughing up green thick phlegm.  Denies having any fevers.  Reports having chronic substernal chest pain.  No chest pain at present.  I could not get any further history from the patient regarding her chest pain.  ED Course: Oxygen saturation 80% with ambulation.  No leukocytosis.  EKG without acute changes.  Chest x-ray showing hyperinflated lungs with emphysematous changes; no focal consolidation, pleural effusion, or pneumothorax.  Received albuterol and ipratropium nebulizer treatments in the ED.  In addition, received Solu-Medrol 125 mg once.  Review of Systems: As per HPI otherwise 10 point review of systems negative.  Past Medical History:  Diagnosis Date  . Anxiety   . Cervical disc disorder with myelopathy, unspecified cervical region   . Chronic systolic (congestive) heart failure ([place])   . COPD (chronic obstructive pulmonary disease) ([place])   . Essential hypertension   . Hemorrhoids   . Liver mass   . Lung, cysts, congenital    Left lung cyst  . Myocardial infarction ([place])   . Nephrolithiasis    Embedded  . Nonischemic cardiomyopathy ([place])    LVEF 35-40% 2015  . On home O2   . Osteoarthritis   . Thoracic ascending aortic aneurysm (HCC)    4.3 cm April 2016    Past Surgical History:  Procedure Laterality Date  . Benign breast tumors    . CHOLECYSTECTOMY    . COLONOSCOPY    .  COLONOSCOPY N/A 09/22/2014   Procedure: COLONOSCOPY;  Surgeon: Rogene Houston, MD;  [facility];  Service: Endoscopy;  Laterality: N/A;  830 -- to be done in OR under fluoro  . Complete hysterectomy    . CORONARY STENT INTERVENTION N/A 11/23/2016   Procedure: Coronary Stent Intervention;  Surgeon: Martinique, Peter M, MD;  Location: [place] [facility];  Service: Cardiovascular;  Laterality: N/A;  . LEFT HEART CATH AND CORONARY ANGIOGRAPHY N/A 11/23/2016   Procedure: Left Heart Cath and Coronary Angiography;  Surgeon: Martinique, Peter M, MD;  Location: [place] [facility];  Service: Cardiovascular;  Laterality: N/A;  . TONSILLECTOMY AND ADENOIDECTOMY       reports that she quit smoking about 9 years ago. Her smoking use included cigarettes. She started smoking about 40 years ago. She has a 7.75 pack-year smoking history. She has never used smokeless tobacco. She reports that she does not drink alcohol or use drugs.  Allergies  Allergen Reactions  . Plavix [Clopidogrel Bisulfate] Itching    Severe itching  . Montelukast Sodium     Pt states she felt a "headache and her eyes started to Burn "   . Alprazolam Nausea And Vomiting  . Codeine Nausea And Vomiting  . Percodan [Oxycodone-Aspirin] Nausea And Vomiting  . Valium Nausea And Vomiting    Family History  Problem Relation Age of Onset  . Heart disease Mother   . Aneurysm  Father   . Lung cancer Brother   . Heart disease Sister   . Diabetes Brother   . Heart disease Brother     Prior to Admission medications   Medication Sig Start Date End Date Taking? Authorizing Provider  albuterol (PROVENTIL) (2.5 MG/3ML) 0.083% nebulizer solution Take 3 mLs (2.5 mg total) by nebulization every 6 (six) hours as needed for wheezing or shortness of breath. 07/13/15  Yes Sinda Du, MD  aspirin EC 81 MG tablet Take 81 mg by mouth every morning.    Yes [provider]  atorvastatin (LIPITOR) 80 MG tablet Take 1 tablet (80 mg total) by  mouth daily at 6 PM. 11/24/16  Yes Bhagat, Bhavinkumar, PA  carvedilol (COREG) 6.25 MG tablet TAKE ONE TABLET BY MOUTH 2 TIMES A DAY. 01/20/18  Yes Herminio Commons, MD  clorazepate (TRANXENE) 7.5 MG tablet Take 7.5 mg by mouth daily as needed for anxiety. For nerves   Yes [provider]  ENTRESTO 24-26 MG TAKE 1 TABLET BY MOUTH TWICE DAILY. 11/29/17  Yes Herminio Commons, MD  meclizine (ANTIVERT) 25 MG tablet Take 1 or 2  Every 6 hours Patient taking differently: Take 12.5-25 mg by mouth 4 (four) times daily as needed for dizziness.  04/03/17  Yes Sinda Du, MD  OXYGEN Inhale 2 L into the lungs every evening.   Yes [provider]  PROAIR HFA 108 (90 BASE) MCG/ACT inhaler Inhale 2 puffs into the lungs every 6 (six) hours as needed for wheezing or shortness of breath.  02/08/14  Yes [provider]  SYMBICORT 160-4.5 MCG/ACT inhaler Inhale 2 puffs into the lungs 2 (two) times daily. 09/23/17  Yes [provider]  traMADol (ULTRAM) 50 MG tablet Take 50 mg by mouth daily as needed for moderate pain or severe pain. Maximum dose= 8 tablets per day   Yes [provider]  nitroGLYCERIN (NITROSTAT) 0.4 MG SL tablet Place 1 tablet (0.4 mg total) under the tongue every 5 (five) minutes x 3 doses as needed for chest pain. 02/05/18   Sinda Du, MD    Physical Exam: Vitals:   04/24/18 1500 04/24/18 1530 04/24/18 1600 04/24/18 1615  BP: (!) 151/51 (!) 131/56 (!) 149/65   Pulse:      Resp:      Temp:      TempSrc:      SpO2:    95%  Weight:      Height:        Physical Exam  Constitutional: She is oriented to person, place, and time. She appears well-developed and well-nourished. No distress.  HENT:  Head: Normocephalic.  Eyes: Right eye exhibits no discharge. Left eye exhibits no discharge.  Neck: Neck supple.  Cardiovascular: Normal rate, regular rhythm and intact distal pulses.  Pulmonary/Chest: Effort normal. She has wheezes. She has  no rales.  Speaking clearly in full sentences  Abdominal: Soft. Bowel sounds are normal. She exhibits no distension. There is no abdominal tenderness. There is no guarding.  Musculoskeletal:        General: No edema.  Neurological: She is alert and oriented to person, place, and time.  Skin: Skin is warm and dry. She is not diaphoretic.     Labs on Admission: I have personally reviewed following labs and imaging studies  CBC: Recent Labs  Lab 04/24/18 1239  WBC 9.5  NEUTROABS 7.5  HGB 12.2  HCT 40.2  MCV 85.4  PLT 644   Basic Metabolic Panel: Recent  Labs  Lab 04/24/18 1239  NA 142  K 4.2  CL 105  CO2 30  GLUCOSE 118*  BUN 12  CREATININE 0.66  CALCIUM 9.8   GFR: Estimated Creatinine Clearance: 54.9 mL/min (by C-G formula based on SCr of 0.66 mg/dL). Liver Function Tests: Recent Labs  Lab 04/24/18 1241  AST 19  ALT 11  ALKPHOS 58  BILITOT 1.0  PROT 6.6  ALBUMIN 4.0   No results for input(s): LIPASE, AMYLASE in the last 168 hours. No results for input(s): AMMONIA in the last 168 hours. Coagulation Profile: No results for input(s): INR, PROTIME in the last 168 hours. Cardiac Enzymes: No results for input(s): CKTOTAL, CKMB, CKMBINDEX, TROPONINI in the last 168 hours. BNP (last 3 results) No results for input(s): PROBNP in the last 8760 hours. HbA1C: No results for input(s): HGBA1C in the last 72 hours. CBG: No results for input(s): GLUCAP in the last 168 hours. Lipid Profile: No results for input(s): CHOL, HDL, LDLCALC, TRIG, CHOLHDL, LDLDIRECT in the last 72 hours. Thyroid Function Tests: No results for input(s): TSH, T4TOTAL, FREET4, T3FREE, THYROIDAB in the last 72 hours. Anemia Panel: No results for input(s): VITAMINB12, FOLATE, FERRITIN, TIBC, IRON, RETICCTPCT in the last 72 hours. Urine analysis:    Component Value Date/Time   COLORURINE STRAW (A) 11/11/2017 1122   APPEARANCEUR CLEAR 11/11/2017 1122   LABSPEC 1.006 11/11/2017 1122   PHURINE  6.0 11/11/2017 1122   GLUCOSEU NEGATIVE 11/11/2017 1122   HGBUR NEGATIVE 11/11/2017 1122   BILIRUBINUR NEGATIVE 11/11/2017 1122   KETONESUR NEGATIVE 11/11/2017 1122   PROTEINUR NEGATIVE 11/11/2017 1122   UROBILINOGEN 0.2 08/20/2013 1610   NITRITE NEGATIVE 11/11/2017 1122   LEUKOCYTESUR TRACE (A) 11/11/2017 1122    Radiological Exams on Admission: Dg Chest Portable 1 View  Result Date: 04/24/2018 CLINICAL DATA:  Left-sided chest pain with coughing and shortness of breath. EXAM: PORTABLE CHEST 1 VIEW COMPARISON:  Chest x-ray dated February 03, 2018. FINDINGS: Stable cardiomegaly. Normal pulmonary vascularity. The lungs remain hyperinflated with emphysematous changes. No focal consolidation, pleural effusion, or pneumothorax. No acute osseous abnormality. IMPRESSION: 1. No active disease. 2. COPD. Electronically Signed   By: Titus Dubin M.D.   On: 04/24/2018 12:38    EKG: Independently reviewed.  Sinus rhythm, LBBB.  Similar to prior tracing from September 2019.  Assessment/Plan Principal Problem:   COPD exacerbation (HCC) Active Problems:   Hypertension   Chronic combined systolic and diastolic CHF (congestive heart failure) (HCC)   Anxiety   Chronic pain   Acute on chronic respiratory failure with hypoxia (HCC)   Chronic stable angina (HCC)   Physical deconditioning  Acute on chronic hypoxic respiratory failure secondary to COPD exacerbation Presenting with worsening dyspnea, wheezing, and productive cough.  Oxygen saturation 80% with ambulation.  Afebrile and no leukocytosis. Chest x-ray showing hyperinflated lungs with emphysematous changes; no focal consolidation, pleural effusion, or pneumothorax.  Received albuterol and ipratropium nebulizer treatments in the ED.  In addition, received Solu-Medrol 125 mg once. -Continue prednisone 40 mg daily starting tomorrow -Azithromycin -Duo nebs every 6 hours -Supplemental oxygen  Chronic stable angina Patient reports having  chronic chest pain but I am not able to get any details from her.  She has a history of CAD with stent placed in July 2018.  Last cardiology office visit March 18, 2018.  EKG showing left bundle branch block similar to prior tracing from September 2019.  Patient is currently chest pain-free and appears comfortable on exam. -Trend troponin -Continue  home aspirin, carvedilol, Lipitor -Sublingual nitroglycerin as needed -Outpatient cardiology follow-up  Chronic combined systolic and diastolic congestive heart failure Echo done in July 2018 showing EF 40 to 45% and grade 1 diastolic dysfunction.  Patient does not appear volume overloaded on exam. -Continue home carvedilol, Entresto  Hypertension -Continue home carvedilol, Entresto  Anxiety -Continue home clorazepate as needed  Physical deconditioning -PT evaluation  Chronic pain -Continue home tramadol as needed  DVT prophylaxis: Lovenox Code Status: Patient wishes to be full code. Family Communication: No family available. Disposition Plan: Anticipate discharge to home in 1 to 2 days. Consults called: None Admission status: Observation   Shela Leff MD [facility] Pager [phone]  If 7PM-7AM, please contact night-coverage [email or web address] Password [facility]  04/24/2018, 4:29 PM

## 2018-04-24 NOTE — ED Provider Notes (Addendum)
[facility] EMERGENCY DEPARTMENT Provider Note   CSN: [id number] Arrival date & time: 04/24/18  1207     History   Chief Complaint Chief Complaint  Patient presents with  . Shortness of Breath    HPI [name] is a 82 y.o. female.  Patient complains of shortness of breath and cough  The history is provided by the patient. No language interpreter was used.  Shortness of Breath  This is a new problem. The problem occurs continuously.The current episode started more than 2 days ago. The problem has not changed since onset.Associated symptoms include wheezing. Pertinent negatives include no fever, no headaches, no cough, no chest pain, no abdominal pain and no rash. It is unknown what precipitated the problem. Risk factors: History of COPD. She has tried ipratropium inhalers for the symptoms. She has had prior hospitalizations. Associated medical issues include COPD.    Past Medical History:  Diagnosis Date  . Anxiety   . Cervical disc disorder with myelopathy, unspecified cervical region   . Chronic systolic (congestive) heart failure ([place])   . COPD (chronic obstructive pulmonary disease) ([place])   . Essential hypertension   . Hemorrhoids   . Liver mass   . Lung, cysts, congenital    Left lung cyst  . Myocardial infarction ([place])   . Nephrolithiasis    Embedded  . Nonischemic cardiomyopathy ([place])    LVEF 35-40% 2015  . On home O2   . Osteoarthritis   . Thoracic ascending aortic aneurysm (H[facility])    4.3 cm April 2016    Patient Active Problem List   Diagnosis Date Noted  . CAD (coronary artery disease) 02/03/2018  . Ischemic cardiomyopathy 11/25/2017  . Dizziness 04/01/2017  . COPD (chronic obstructive pulmonary disease) ([place]) 04/01/2017  . CAD S/P percutaneous coronary angioplasty 04/01/2017  . NSTEMI (non-ST elevated myocardial infarction) ([place]) 11/23/2016  . Hypertensive cardiovascular disease 10/03/2015  . Protein-calorie malnutrition, severe 07/13/2015  .  Demand ischemia of myocardium ([place]) 07/13/2015  . Nonischemic cardiomyopathy ([place])   . Atypical chest pain   . Left bundle branch block   . COPD exacerbation ([place]) 07/11/2015  . Flu-like symptoms 07/11/2015  . Elevated troponin 07/11/2015  . Acute bronchitis 07/11/2015  . GERD (gastroesophageal reflux disease) 07/11/2015  . Bronchospasm   . Ascending aortic aneurysm ([place]) 01/27/2015  . Chronic combined systolic and diastolic CHF (congestive heart failure) ([place]) 01/27/2015  . Chest pain at rest 01/27/2015  . Essential hypertension 01/27/2015  . Anxiety 01/27/2015  . Chronic pain 01/27/2015  . Chest pain 08/20/2013  . Hypertension 08/20/2013  . Contusion of left knee 08/07/2012  . Sprain of wrist 08/07/2012  . Liver mass 05/21/2011  . Abdominal pain 05/21/2011  . Bronchitis 05/21/2011  . De Quervain's disease (tenosynovitis) 04/02/2011  . SHOULDER PAIN 01/19/2009  . CERVICALGIA 01/19/2009  . IMPINGEMENT SYNDROME 01/19/2009    Past Surgical History:  Procedure Laterality Date  . Benign breast tumors    . CHOLECYSTECTOMY    . COLONOSCOPY    . COLONOSCOPY N/A 09/22/2014   Procedure: COLONOSCOPY;  Surgeon: Rogene Houston, MD;  Location[facility];  Service: Endoscopy;  Laterality: N/A;  830 -- to be done in OR under fluoro  . Complete hysterectomy    . CORONARY STENT INTERVENTION N/A 11/23/2016   Procedure: Coronary Stent Intervention;  Surgeon: Martinique, Peter M, MD;  Location: [place] CV LAB;  Service: Cardiovascular;  Laterality: N/A;  . LEFT HEART CATH AND CORONARY ANGIOGRAPHY N/A 11/23/2016  Procedure: Left Heart Cath and Coronary Angiography;  Surgeon: Martinique, Peter M, MD;  Location: [place] CV LAB;  Service: Cardiovascular;  Laterality: N/A;  . TONSILLECTOMY AND ADENOIDECTOMY       OB History   No obstetric history on file.      Home Medications    Prior to Admission medications   Medication Sig Start Date End Date Taking? Authorizing Provider    albuterol (PROVENTIL) (2.5 MG/3ML) 0.083% nebulizer solution Take 3 mLs (2.5 mg total) by nebulization every 6 (six) hours as needed for wheezing or shortness of breath. 07/13/15  Yes Sinda Du, MD  aspirin EC 81 MG tablet Take 81 mg by mouth every morning.    Yes [provider]  atorvastatin (LIPITOR) 80 MG tablet Take 1 tablet (80 mg total) by mouth daily at 6 PM. 11/24/16  Yes Bhagat, Bhavinkumar, PA  carvedilol (COREG) 6.25 MG tablet TAKE ONE TABLET BY MOUTH 2 TIMES A DAY. 01/20/18  Yes Herminio Commons, MD  clorazepate (TRANXENE) 7.5 MG tablet Take 7.5 mg by mouth daily as needed for anxiety. For nerves   Yes [provider]  ENTRESTO 24-26 MG TAKE 1 TABLET BY MOUTH TWICE DAILY. 11/29/17  Yes Herminio Commons, MD  meclizine (ANTIVERT) 25 MG tablet Take 1 or 2  Every 6 hours Patient taking differently: Take 12.5-25 mg by mouth 4 (four) times daily as needed for dizziness.  04/03/17  Yes Sinda Du, MD  OXYGEN Inhale 2 L into the lungs every evening.   Yes [provider]  PROAIR HFA 108 (90 BASE) MCG/ACT inhaler Inhale 2 puffs into the lungs every 6 (six) hours as needed for wheezing or shortness of breath.  02/08/14  Yes [provider]  SYMBICORT 160-4.5 MCG/ACT inhaler Inhale 2 puffs into the lungs 2 (two) times daily. 09/23/17  Yes [provider]  traMADol (ULTRAM) 50 MG tablet Take 50 mg by mouth daily as needed for moderate pain or severe pain. Maximum dose= 8 tablets per day   Yes [provider]  nitroGLYCERIN (NITROSTAT) 0.4 MG SL tablet Place 1 tablet (0.4 mg total) under the tongue every 5 (five) minutes x 3 doses as needed for chest pain. 02/05/18   Sinda Du, MD    Family History Family History  Problem Relation Age of Onset  . Heart disease Mother   . Aneurysm Father   . Lung cancer Brother   . Heart disease Sister   . Diabetes Brother   . Heart disease Brother     Social History Social History    Tobacco Use  . Smoking status: Former Smoker    Packs/day: 0.25    Years: 31.00    Pack years: 7.75    Types: Cigarettes    Start date: 05/14/1977    Last attempt to quit: 05/28/2008    Years since quitting: 9.9  . Smokeless tobacco: Never Used  Substance Use Topics  . Alcohol use: No    Alcohol/week: 0.0 standard drinks  . Drug use: No     Allergies   Plavix [clopidogrel bisulfate]; Montelukast sodium; Alprazolam; Codeine; Percodan [oxycodone-aspirin]; and Valium   Review of Systems Review of Systems  Constitutional: Negative for appetite change, fatigue and fever.  HENT: Negative for congestion, ear discharge and sinus pressure.   Eyes: Negative for discharge.  Respiratory: Positive for shortness of breath and wheezing. Negative for cough.   Cardiovascular: Negative for chest pain.  Gastrointestinal: Negative for abdominal pain and diarrhea.  Genitourinary: Negative for frequency and hematuria.  Musculoskeletal: Negative for back pain.  Skin: Negative for rash.  Neurological: Negative for seizures and headaches.  Psychiatric/Behavioral: Negative for hallucinations.     Physical Exam Updated Vital Signs BP 99/79   Pulse 78   Temp 97.9 F (36.6 C) (Oral)   Resp 19   Ht 5\' 2"  (1.575 m)   Wt 79.8 kg   SpO2 93%   BMI 32.19 kg/m   Physical Exam Constitutional:      Appearance: She is well-developed.  HENT:     Head: Normocephalic.     Nose: Nose normal.     Mouth/Throat:     Mouth: Mucous membranes are moist.  Eyes:     General: No scleral icterus.    Conjunctiva/sclera: Conjunctivae normal.  Neck:     Musculoskeletal: Neck supple.     Thyroid: No thyromegaly.  Cardiovascular:     Rate and Rhythm: Normal rate and regular rhythm.     Heart sounds: No murmur. No friction rub. No gallop.   Pulmonary:     Breath sounds: No stridor. Wheezing present. No rales.  Chest:     Chest wall: No tenderness.  Abdominal:     General: There is no distension.      Tenderness: There is no abdominal tenderness. There is no rebound.  Musculoskeletal: Normal range of motion.  Lymphadenopathy:     Cervical: No cervical adenopathy.  Skin:    Findings: No erythema or rash.  Neurological:     Mental Status: She is oriented to person, place, and time.     Motor: No abnormal muscle tone.     Coordination: Coordination normal.  Psychiatric:        Behavior: Behavior normal.      ED Treatments / Results  Labs (all labs ordered are listed, but only abnormal results are displayed) Labs Reviewed  CBC WITH DIFFERENTIAL/PLATELET - Abnormal; Notable for the following components:      Result Value   MCH 25.9 (*)    All other components within normal limits  BASIC METABOLIC PANEL - Abnormal; Notable for the following components:   Glucose, Bld 118 (*)    All other components within normal limits  HEPATIC FUNCTION PANEL    EKG None  Radiology Dg Chest Portable 1 View  Result Date: 04/24/2018 CLINICAL DATA:  Left-sided chest pain with coughing and shortness of breath. EXAM: PORTABLE CHEST 1 VIEW COMPARISON:  Chest x-ray dated February 03, 2018. FINDINGS: Stable cardiomegaly. Normal pulmonary vascularity. The lungs remain hyperinflated with emphysematous changes. No focal consolidation, pleural effusion, or pneumothorax. No acute osseous abnormality. IMPRESSION: 1. No active disease. 2. COPD. Electronically Signed   By: Titus Dubin M.D.   On: 04/24/2018 12:38    Procedures Procedures (including critical care time)  Medications Ordered in ED Medications  ipratropium-albuterol (DUONEB) 0.5-2.5 (3) MG/3ML nebulizer solution 3 mL (has no administration in time range)  methylPREDNISolone sodium succinate (SOLU-MEDROL) 125 mg/2 mL injection 125 mg (125 mg Intravenous Given 04/24/18 1301)  ipratropium-albuterol (DUONEB) 0.5-2.5 (3) MG/3ML nebulizer solution 3 mL (3 mLs Nebulization Given 04/24/18 1343)  albuterol (PROVENTIL) (2.5 MG/3ML) 0.083%  nebulizer solution 2.5 mg (2.5 mg Nebulization Given 04/24/18 1343)   CRITICAL CARE Performed by: Milton Ferguson Total critical care time: 45 minutes Critical care time was exclusive of separately billable procedures and treating other patients. Critical care was necessary to treat or prevent imminent or life-threatening deterioration. Critical care was time spent personally by me  on the following activities: development of treatment plan with patient and/or surrogate as well as nursing, discussions with consultants, evaluation of patient's response to treatment, examination of patient, obtaining history from patient or surrogate, ordering and performing treatments and interventions, ordering and review of laboratory studies, ordering and review of radiographic studies, pulse oximetry and re-evaluation of patient's condition.   Initial Impression / Assessment and Plan / ED Course  I have reviewed the triage vital signs and the nursing notes.  Pertinent labs & imaging results that were available during my care of the patient were reviewed by me and considered in my medical decision making (see chart for details).     Patient with COPD exacerbation.  Patient's O2 sats run about 80% when she is ambulating.  Patient will be admitted for continued care  Final Clinical Impressions(s) / ED Diagnoses   Final diagnoses:  COPD exacerbation [facility]    ED Discharge Orders    None       Milton Ferguson, MD 04/24/18 1521    Milton Ferguson, MD 05/10/18 1346

## 2018-04-24 NOTE — ED Notes (Signed)
Per Dr. Roderic Palau - patient ambulated again in hall.  Patient again had audible wheezing, patient stated she was SOB. Patient's o2 sat dropped to 89%

## 2018-04-24 NOTE — Plan of Care (Signed)

## 2018-04-25 DIAGNOSIS — J9601 Acute respiratory failure with hypoxia: Secondary | ICD-10-CM | POA: Diagnosis not present

## 2018-04-25 DIAGNOSIS — J441 Chronic obstructive pulmonary disease with (acute) exacerbation: Secondary | ICD-10-CM | POA: Diagnosis not present

## 2018-04-25 LAB — URINALYSIS, DIPSTICK ONLY
Bilirubin Urine: NEGATIVE
Glucose, UA: NEGATIVE mg/dL
Hgb urine dipstick: NEGATIVE
Ketones, ur: NEGATIVE mg/dL
Nitrite: NEGATIVE
Protein, ur: NEGATIVE mg/dL
Specific Gravity, Urine: 1.003 — ABNORMAL LOW (ref 1.005–1.030)
pH: 7 (ref 5.0–8.0)

## 2018-04-25 LAB — HIV ANTIBODY (ROUTINE TESTING W REFLEX): HIV Screen 4th Generation wRfx: NONREACTIVE

## 2018-04-25 LAB — TROPONIN I: Troponin I: <0.03 ng/mL (ref <0.03)

## 2018-04-25 MED ORDER — METHYLPREDNISOLONE SODIUM SUCC 40 MG IJ SOLR
40.0000 mg | Freq: Two times a day (BID) | INTRAMUSCULAR | Status: DC
  Administered 2018-04-25 – 2018-04-28 (×7): 40 mg via INTRAVENOUS
  Filled 2018-04-25 (×7): qty 1

## 2018-04-25 MED ORDER — INFLUENZA VAC SPLIT HIGH-DOSE 0.5 ML IM SUSY
0.5000 mL | PREFILLED_SYRINGE | INTRAMUSCULAR | Status: AC
  Administered 2018-04-28: 0.5 mL via INTRAMUSCULAR
  Filled 2018-04-25: qty 0.5

## 2018-04-25 NOTE — Plan of Care (Signed)
  Problem: Acute Rehab PT Goals(only PT should resolve) Goal: Patient Will Transfer Sit To/From Stand Outcome: Progressing Flowsheets (Taken 04/25/2018 1311) Patient will transfer sit to/from stand: with modified independence Goal: Pt Will Transfer Bed To Chair/Chair To Bed Outcome: Progressing Flowsheets (Taken 04/25/2018 1311) Pt will Transfer Bed to Chair/Chair to Bed: with modified independence Goal: Pt Will Ambulate Outcome: Progressing Flowsheets (Taken 04/25/2018 1311) Pt will Ambulate: > 125 feet; with least restrictive assistive device; with min guard assist; with supervision Goal: Pt/caregiver will Perform Home Exercise Program Outcome: Progressing Flowsheets (Taken 04/25/2018 1311) Pt/caregiver will Perform Home Exercise Program: For increased strengthening; For improved balance; With minimal assist   [name]. Hartnett-Rands, MS, PT Per [place] [phone] 04/25/2018

## 2018-04-25 NOTE — Care Management Obs Status (Signed)
[place] NOTIFICATION   Patient Details  Name: [name] MRN: [id number] Date of Birth: Jul 22, 1935   Medicare Observation Status Notification Given:  Yes    Shelda Altes 04/25/2018, 1:03 PM

## 2018-04-25 NOTE — Progress Notes (Signed)
Subjective: She says she feels better but is still short of breath.  She has no other new complaints.  She is coughing up sputum.  Objective: Vital signs in last 24 hours: Temp:  [97.9 F (36.6 C)-98.7 F (37.1 C)] 98.7 F (37.1 C) (12/13 0515) Pulse Rate:  [64-81] 81 (12/13 0515) Resp:  [15-23] 18 (12/13 0515) BP: (99-154)/(50-119) 117/77 (12/13 0515) SpO2:  [91 %-100 %] 98 % (12/13 0812) Weight:  [79.8 kg] 79.8 kg (12/12 1212) Weight change:  Last BM Date: 04/24/18  Intake/Output from previous day: 12/12 0701 - 12/13 0700 In: 240 [P.O.:240] Out: 700 [Urine:700]  PHYSICAL EXAM General appearance: alert, cooperative and mild distress Resp: rhonchi bilaterally Cardio: regular rate and rhythm, S1, S2 normal, no murmur, click, rub or gallop GI: soft, non-tender; bowel sounds normal; no masses,  no organomegaly Extremities: extremities normal, atraumatic, no cyanosis or edema  Lab Results:  Results for orders placed or performed during the hospital encounter of 04/24/18 (from the past 48 hour(s))  CBC with Differential     Status: Abnormal   Collection Time: 04/24/18 12:39 PM  Result Value Ref Range   WBC 9.5 4.0 - 10.5 K/uL   RBC 4.71 3.87 - 5.11 MIL/uL   Hemoglobin 12.2 12.0 - 15.0 g/dL   HCT 40.2 36.0 - 46.0 %   MCV 85.4 80.0 - 100.0 fL   MCH 25.9 (L) 26.0 - 34.0 pg   MCHC 30.3 30.0 - 36.0 g/dL   RDW 13.7 11.5 - 15.5 %   Platelets 263 150 - 400 K/uL   nRBC 0.0 0.0 - 0.2 %   Neutrophils Relative % 80 %   Neutro Abs 7.5 1.7 - 7.7 K/uL   Lymphocytes Relative 13 %   Lymphs Abs 1.3 0.7 - 4.0 K/uL   Monocytes Relative 3 %   Monocytes Absolute 0.3 0.1 - 1.0 K/uL   Eosinophils Relative 4 %   Eosinophils Absolute 0.4 0.0 - 0.5 K/uL   Basophils Relative 0 %   Basophils Absolute 0.0 0.0 - 0.1 K/uL   Immature Granulocytes 0 %   Abs Immature Granulocytes 0.02 0.00 - 0.07 K/uL    Comment: Performed at [facility], [address] [place], [place] [address]  Basic metabolic  panel     Status: Abnormal   Collection Time: 04/24/18 12:39 PM  Result Value Ref Range   Sodium 142 135 - 145 mmol/L   Potassium 4.2 3.5 - 5.1 mmol/L   Chloride 105 98 - 111 mmol/L   CO2 30 22 - 32 mmol/L   Glucose, Bld 118 (H) 70 - 99 mg/dL   BUN 12 8 - 23 mg/dL   Creatinine, Ser 0.66 0.44 - 1.00 mg/dL   Calcium 9.8 8.9 - 10.3 mg/dL   GFR calc non Af Amer >60 >60 mL/min   GFR calc Af Amer >60 >60 mL/min   Anion gap 7 5 - 15    Comment: Performed at [facility], [address], [place], [place] [address]  Hepatic function panel     Status: None   Collection Time: 04/24/18 12:41 PM  Result Value Ref Range   Total Protein 6.6 6.5 - 8.1 g/dL   Albumin 4.0 3.5 - 5.0 g/dL   AST 19 15 - 41 U/L   ALT 11 0 - 44 U/L   Alkaline Phosphatase 58 38 - 126 U/L   Total Bilirubin 1.0 0.3 - 1.2 mg/dL   Bilirubin, Direct 0.1 0.0 - 0.2 mg/dL   Indirect  Bilirubin 0.9 0.3 - 0.9 mg/dL    Comment: Performed at [facility], [address], [place], [place] [address]  HIV antibody     Status: None   Collection Time: 04/24/18  4:24 PM  Result Value Ref Range   HIV Screen 4th Generation wRfx Non Reactive Non Reactive    Comment: (NOTE) Performed At: [facility] [place], [place] [id number] Rush Farmer MD [id number]   Troponin I - Now Then Q6H     Status: None   Collection Time: 04/24/18  4:24 PM  Result Value Ref Range   Troponin I <0.03 <0.03 ng/mL    Comment: Performed at [facility], [address], [place], [place] [address]  Troponin I - Now Then Q6H     Status: None   Collection Time: 04/24/18  9:36 PM  Result Value Ref Range   Troponin I <0.03 <0.03 ng/mL    Comment: Performed at [facility], [address], [place], [place] [address]  Troponin I - Now Then Q6H     Status: None   Collection Time: 04/25/18  3:57 AM  Result Value Ref Range   Troponin I <0.03 <0.03 ng/mL    Comment: Performed at [facility], [address], [place],  [address]     ABGS No results for input(s): PHART, PO2ART, TCO2, HCO3 in the last 72 hours.  Invalid input(s): PCO2 CULTURES No results found for this or any previous visit (from the past 240 hour(s)). Studies/Results: Dg Chest Portable 1 View  Result Date: 04/24/2018 CLINICAL DATA:  Left-sided chest pain with coughing and shortness of breath. EXAM: PORTABLE CHEST 1 VIEW COMPARISON:  Chest x-ray dated February 03, 2018. FINDINGS: Stable cardiomegaly. Normal pulmonary vascularity. The lungs remain hyperinflated with emphysematous changes. No focal consolidation, pleural effusion, or pneumothorax. No acute osseous abnormality. IMPRESSION: 1. No active disease. 2. COPD. Electronically Signed   By: Titus Dubin M.D.   On: 04/24/2018 12:38    Medications:  Prior to Admission:  Medications Prior to Admission  Medication Sig Dispense Refill Last Dose  . albuterol (PROVENTIL) (2.5 MG/3ML) 0.083% nebulizer solution Take 3 mLs (2.5 mg total) by nebulization every 6 (six) hours as needed for wheezing or shortness of breath. 75 mL 12 04/24/2018 at Unknown time  . aspirin EC 81 MG tablet Take 81 mg by mouth every morning.    04/24/2018 at Unknown time  . atorvastatin (LIPITOR) 80 MG tablet Take 1 tablet (80 mg total) by mouth daily at 6 PM. 30 tablet 6 04/23/2018 at Unknown time  . carvedilol (COREG) 6.25 MG tablet TAKE ONE TABLET BY MOUTH 2 TIMES A DAY. 180 tablet 3 04/24/2018 at 0900  . clorazepate (TRANXENE) 7.5 MG tablet Take 7.5 mg by mouth daily as needed for anxiety. For nerves   04/24/2018 at Unknown time  . ENTRESTO 24-26 MG TAKE 1 TABLET BY MOUTH TWICE DAILY. 60 tablet 6 04/24/2018 at Unknown time  . meclizine (ANTIVERT) 25 MG tablet Take 1 or 2  Every 6 hours (Patient taking differently: Take 12.5-25 mg by mouth 4 (four) times daily as needed for dizziness. ) 100 tablet 3 Past Week at Unknown time  . OXYGEN Inhale 2 L into the lungs every evening.   04/24/2018 at Unknown time  . PROAIR HFA 108 (90  BASE) MCG/ACT inhaler Inhale 2 puffs into the lungs every 6 (six) hours as needed for wheezing or shortness of breath.    04/24/2018 at Unknown time  . SYMBICORT  160-4.5 MCG/ACT inhaler Inhale 2 puffs into the lungs 2 (two) times daily.   04/24/2018 at Unknown time  . traMADol (ULTRAM) 50 MG tablet Take 50 mg by mouth daily as needed for moderate pain or severe pain. Maximum dose= 8 tablets per day   04/23/2018 at Unknown time  . nitroGLYCERIN (NITROSTAT) 0.4 MG SL tablet Place 1 tablet (0.4 mg total) under the tongue every 5 (five) minutes x 3 doses as needed for chest pain. 25 tablet 12 unknown   Scheduled: . aspirin EC  81 mg Oral [id number]  . atorvastatin  80 mg Oral q1800  . azithromycin  250 mg Oral Daily  . carvedilol  6.25 mg Oral BID WC  . enoxaparin (LOVENOX) injection  40 mg Subcutaneous Q24H  . ipratropium-albuterol  3 mL Nebulization Q6H  . methylPREDNISolone (SOLU-MEDROL) injection  40 mg Intravenous Q12H  . sacubitril-valsartan  1 tablet Oral BID   Continuous:  TFT:[id number], nitroGLYCERIN, traMADol  Assesment: She has COPD exacerbation.  She has improved with treatment but is still short of breath with exertion still requiring oxygen and still coughing up sputum.  She has acute on chronic hypoxic respiratory failure on oxygen  She has physical deconditioning and we discussed going to a rehab facility.  She initially seemed in favor of that and then later decided she did not want to pursue that  Has severe anxiety that complicates her situation Principal Problem:   COPD exacerbation ([place]) Active Problems:   Hypertension   Chronic combined systolic and diastolic CHF (congestive heart failure) ([facility])   Anxiety   Chronic pain   Acute on chronic respiratory failure with hypoxia ([place])   Chronic stable angina ([place])   Physical deconditioning    Plan: Continue current treatments    LOS: 0 days   [name] L 04/25/2018, 8:51 AM

## 2018-04-25 NOTE — Evaluation (Signed)
Physical Therapy Evaluation Patient Details Name: [name] MRN: [id number] DOB: [birth year] Today's Date: 04/25/2018   History of Present Illness  Patient is 82 year old female admitted 04/24/2018 with diagnosis of COPD exacerbation. PMH: HTN, CHF, anxiety, chronic pain, acute on chronic respiratory failure with hypoxia, physical deconditioning.    Clinical Impression  Patient reports her RW is more than 80 years old and in disrepair. Patient's laundry room is in the basement but patient willing to allow daughter to do her laundry for now. Patient agreeable to using RW in home instead of reaching for walls and furniture. Today patient able to ambulate on room air 350 feet with use of RW. Patient exhibited ild trunk flexion with verbal cues to walk more into RW; slow, somewhat labored gait, no LOB, SPO2 levels >88% throughout with avg around 91%; HR <98 bpm throughout.  Patient would continue to benefit from skilled physical therapy in current environment and next venue to continue return to prior function and increase strength, endurance, balance, coordination, and functional mobility and gait skills.      Follow Up Recommendations Home health PT;Supervision - Intermittent    Equipment Recommendations  Rolling walker with 5" wheels    Recommendations for Other Services       Precautions / Restrictions Precautions Precautions: Fall Restrictions Weight Bearing Restrictions: No      Mobility  Bed Mobility Overal bed mobility: Modified Independent             General bed mobility comments: HOB elevate and use of bedrail  Transfers Overall transfer level: Needs assistance   Transfers: Sit to/from Stand;Stand Pivot Transfers Sit to Stand: Supervision Stand pivot transfers: Supervision;Min guard          Ambulation/Gait Ambulation/Gait assistance: Supervision;Min guard Gait Distance (Feet): 350 Feet Assistive device: Rolling walker (2 wheeled) Gait  Pattern/deviations: Step-through pattern;Decreased step length - right;Decreased step length - left;Decreased stride length Gait velocity: decreased   General Gait Details: mild trunk flexion with verbal cues to walk more into RW; slow, somewhat labored gait, no LOB, SPO2 levels >88% throughout with avg around 91%; HR <98 bpm throughout  Stairs            Wheelchair Mobility    Modified Rankin (Stroke Patients Only)       Balance Overall balance assessment: Needs assistance Sitting-balance support: No upper extremity supported;Feet supported Sitting balance-Leahy Scale: Good     Standing balance support: Bilateral upper extremity supported;During functional activity Standing balance-Leahy Scale: Good Standing balance comment: fair/poor without AD reaching for objects                             Pertinent Vitals/Pain Pain Assessment: No/denies pain    Home Living Family/patient expects to be discharged to:: Private residence Living Arrangements: Alone Available Help at Discharge: Family;Available PRN/intermittently(daughter; family has medical issues as well) Type of Home: House Home Access: Stairs to enter Entrance Stairs-Rails: None Entrance Stairs-Number of Steps: 1 Home Layout: Two level;Able to live on main level with bedroom/bathroom Home Equipment: Grab bars - tub/shower;Grab bars - toilet      Prior Function Level of Independence: Independent with assistive device(s)         Comments: household and short distanced community ambulator with [facility]; daughter Financial controller Dominance   Dominant Hand: Right    Extremity/Trunk Assessment   Upper Extremity Assessment Upper Extremity Assessment: LUE deficits/detail;Generalized weakness LUE  Deficits / Details: rotator cuff tear; no AROM past 30 degrees scaption    Lower Extremity Assessment Lower Extremity Assessment: Generalized weakness    Cervical / Trunk Assessment Cervical / Trunk  Assessment: Kyphotic  Communication   Communication: HOH  Cognition Arousal/Alertness: Awake/alert Behavior During Therapy: WFL for tasks assessed/performed Overall Cognitive Status: Within Functional Limits for tasks assessed                                        General Comments      Exercises     Assessment/Plan    PT Assessment Patient needs continued PT services  PT Problem List Decreased strength;Decreased mobility;Decreased activity tolerance;Decreased balance;Decreased knowledge of use of DME       PT Treatment Interventions DME instruction;Therapeutic activities;Gait training;Therapeutic exercise;Patient/family education;Balance training    PT Goals (Current goals can be found in the Care Plan section)  Acute Rehab PT Goals Patient Stated Goal: go home with help from daughter for laundry PT Goal Formulation: With patient Time For Goal Achievement: 05/02/18 Potential to Achieve Goals: Good    Frequency Min 3X/week   Barriers to discharge        Co-evaluation               AM-PAC PT "6 Clicks" Mobility  Outcome Measure Help needed turning from your back to your side while in a flat bed without using bedrails?: None Help needed moving from lying on your back to sitting on the side of a flat bed without using bedrails?: A Little Help needed moving to and from a bed to a chair (including a wheelchair)?: A Little Help needed standing up from a chair using your arms (e.g., wheelchair or bedside chair)?: A Little Help needed to walk in hospital room?: A Little Help needed climbing 3-5 steps with a railing? : A Lot 6 Click Score: 18    End of Session Equipment Utilized During Treatment: Gait belt Activity Tolerance: Patient tolerated treatment well;Patient limited by fatigue Patient left: in bed;with call bell/phone within reach Nurse Communication: Mobility status PT Visit Diagnosis: Unsteadiness on feet (R26.81);Other abnormalities of  gait and mobility (R26.89);Muscle weakness (generalized) (M62.81)    Time: 1230-1300 PT Time Calculation (min) (ACUTE ONLY): 30 min   Charges:   PT Evaluation $PT Eval Low Complexity: 1 Low PT Treatments $Gait Training: 8-22 mins        Floria Raveling. Hartnett-Rands, MS, PT [facility] [place] #[id number] 04/25/2018, 1:09 PM

## 2018-04-25 NOTE — Care Management Note (Signed)
Case Management Note  Patient Details  Name: [name] MRN: [id number] Date of Birth: 18-Sep-1935     COPD exacerbation, from home. Needs RW and HH PT. Pt has chosen [facility] for [facility] and DME provider from provider choice list. Aware HH has 48 hrs to make first visit. Vaughan Basta, [facility] rep, given referral and will deliver DME to pt room today.                  Expected Discharge Date:    04/25/18              Expected Discharge Plan:  [place]  In-House Referral:  NA  Discharge planning Services  CM Consult  Post Acute Care Choice:  Durable Medical Equipment Choice offered to:  Patient  DME Arranged:  Walker rolling DME Agency:  [place]:  [facility] Agency:  [place]  Status of Service:  Completed, signed off  If discussed at [place] of Stay Meetings, dates discussed:    Additional Comments:  Sherald Barge, RN 04/25/2018, 1:55 PM

## 2018-04-25 NOTE — Care Management (Addendum)
&lt;p id="js-off-message"&gt; The page could not be loaded. The Medicare.gov Home page currently does not fully support browsers with &amp;quot;JavaScript&amp;quot; disabled. Please note that if you choose to continue without enabling &amp;quot;JavaScript&amp;quot; certain functionalities on this website may not be available. &lt;/p&gt;        Close window    Home health agencies that serve [phone]. Your favorite home health agencies  [place] of Patient Care Rating Patient Survey Summary Rating  [place]  [phone] 4 out of 5 stars 4 out of [place]  [phone] 3 out of 5 stars 5 out of [place]  [phone] 3 out of 5 stars 4 out of [place]  [phone] 3  out of 5 stars 4 out of [place]  [phone] 4  out of 5 stars 4 out of [place]  [phone] 4  out of 5 stars 4 out of [place]  [phone] 4  out of 5 stars 4 out of [place]  [phone] 4 out of 5 stars 4 out of [place] AGE  [phone] 3  out of 5 stars 4 out of 5 stars  [facility]  [phone] 4 out of 5 stars 4 out of [place]  [phone] 3 out of 5 stars 4 out of 5 stars  [facility]  [phone] 2  out of 5 stars 3 out of [place]  [phone] 3  out of 5 stars 3 out of [place]  [phone] Not Available4 Not [place]  [phone] 5 out of 5 stars 3 out of [place] number Footnote as displayed on [place]  1 This agency provides services under a federal waiver program to  non-traditional, chronic long term population.  2 This agency provides services to a special needs population.  3 Not Available.  4 The number of patient episodes for this measure is too small to report.  5 This measure currently does not have data or provider has been certified/recertified for less than 6 months.  6 Medicare is not displaying rates for this measure for any home health agency, because of an issue with the data.  7 Medicare is not displaying rates for this measure for any home health agency, because of an issue with the data.  8 There were problems with the data and they are being corrected.  9 Zero, or very few, patients met the survey's rules for inclusion. The scores shown, if any, reflect a very small number of surveys and may not accurately tell how an agency is doing.  10 Survey results are based on less than 12 months of data.  11 Fewer than 70 patients completed the survey. Use the scores shown, if any, with caution as the number of surveys may be too low to accurately tell how an agency is doing.  12 No survey results are available for this period.  13 Data suppressed by CMS for one or more  quarters.   Medicare.Human resources officer - Print All Results                                    Skip Navigation <p id="js-off-message"> The page could not be loaded. The Medicare.gov Home page currently does not fully support browsers with "JavaScript" disabled. Please note that if you choose to continue without enabling "JavaScript" certain functionalities on this website may not be available. </p>   This application is not fully accessible to users whose browsers do not support or have Cascading Style Sheets (CSS) disabled. For a more optimal experience viewing this application, please enable CSS in your browser and refresh the page.  Site Options  Espaol  1. A Small Font  2. A Medium Font  3. A Large [employer].Water quality scientist Home  Sites ,  Collapsed  ? Medicare.gov ? Supplier Directory ?   Close window     Print All Results  KEY   Accepts Medicare Assignment Your search results for ZIP code [id number] displayed below.    [place]  [place], [place] [address] [phone]  [address] [place], [place] [address] [phone]  3.17  [facility] [place]  [place], [place] [address] [phone]  3.46  [place]  [place],  [address] [phone]  4.22  [place]  ?

## 2018-04-26 DIAGNOSIS — J9601 Acute respiratory failure with hypoxia: Secondary | ICD-10-CM | POA: Diagnosis not present

## 2018-04-26 DIAGNOSIS — J441 Chronic obstructive pulmonary disease with (acute) exacerbation: Secondary | ICD-10-CM | POA: Diagnosis not present

## 2018-04-26 NOTE — Progress Notes (Signed)
Subjective: She says she feels better but still congested and coughing.  No other new complaints.  Still very anxious  Objective: Vital signs in last 24 hours: Temp:  [98.4 F (36.9 C)-99.2 F (37.3 C)] 98.4 F (36.9 C) (12/13 2113) Pulse Rate:  [63-87] 81 (12/14 0923) Resp:  [16-18] 16 (12/14 0533) BP: (124-152)/(45-67) 152/57 (12/14 0923) SpO2:  [91 %-93 %] 92 % (12/14 0911) Weight change:  Last BM Date: 04/24/18  Intake/Output from previous day: 12/13 0701 - 12/14 0700 In: 720 [P.O.:720] Out: -   PHYSICAL EXAM General appearance: alert, cooperative and mild distress Resp: rhonchi bilaterally and wheezes bilaterally Cardio: regular rate and rhythm, S1, S2 normal, no murmur, click, rub or gallop GI: soft, non-tender; bowel sounds normal; no masses,  no organomegaly Extremities: extremities normal, atraumatic, no cyanosis or edema  Lab Results:  Results for orders placed or performed during the hospital encounter of 04/24/18 (from the past 48 hour(s))  CBC with Differential     Status: Abnormal   Collection Time: 04/24/18 12:39 PM  Result Value Ref Range   WBC 9.5 4.0 - 10.5 K/uL   RBC 4.71 3.87 - 5.11 MIL/uL   Hemoglobin 12.2 12.0 - 15.0 g/dL   HCT 40.2 36.0 - 46.0 %   MCV 85.4 80.0 - 100.0 fL   MCH 25.9 (L) 26.0 - 34.0 pg   MCHC 30.3 30.0 - 36.0 g/dL   RDW 13.7 11.5 - 15.5 %   Platelets 263 150 - 400 K/uL   nRBC 0.0 0.0 - 0.2 %   Neutrophils Relative % 80 %   Neutro Abs 7.5 1.7 - 7.7 K/uL   Lymphocytes Relative 13 %   Lymphs Abs 1.3 0.7 - 4.0 K/uL   Monocytes Relative 3 %   Monocytes Absolute 0.3 0.1 - 1.0 K/uL   Eosinophils Relative 4 %   Eosinophils Absolute 0.4 0.0 - 0.5 K/uL   Basophils Relative 0 %   Basophils Absolute 0.0 0.0 - 0.1 K/uL   Immature Granulocytes 0 %   Abs Immature Granulocytes 0.02 0.00 - 0.07 K/uL    Comment: Performed at [facility], [address], [place], [place] [address]  Basic metabolic panel     Status: Abnormal   Collection  Time: 04/24/18 12:39 PM  Result Value Ref Range   Sodium 142 135 - 145 mmol/L   Potassium 4.2 3.5 - 5.1 mmol/L   Chloride 105 98 - 111 mmol/L   CO2 30 22 - 32 mmol/L   Glucose, Bld 118 (H) 70 - 99 mg/dL   BUN 12 8 - 23 mg/dL   Creatinine, Ser 0.66 0.44 - 1.00 mg/dL   Calcium 9.8 8.9 - 10.3 mg/dL   GFR calc non Af Amer >60 >60 mL/min   GFR calc Af Amer >60 >60 mL/min   Anion gap 7 5 - 15    Comment: Performed at [facility], [address], [place], [place] [address]  Hepatic function panel     Status: None   Collection Time: 04/24/18 12:41 PM  Result Value Ref Range   Total Protein 6.6 6.5 - 8.1 g/dL   Albumin 4.0 3.5 - 5.0 g/dL   AST 19 15 - 41 U/L   ALT 11 0 - 44 U/L   Alkaline Phosphatase 58 38 - 126 U/L   Total Bilirubin 1.0 0.3 - 1.2 mg/dL   Bilirubin, Direct 0.1 0.0 - 0.2 mg/dL   Indirect Bilirubin 0.9 0.3 - 0.9 mg/dL    Comment:  Performed at [facility], [address] [place], [place] [address]  HIV antibody     Status: None   Collection Time: 04/24/18  4:24 PM  Result Value Ref Range   HIV Screen 4th Generation wRfx Non Reactive Non Reactive    Comment: (NOTE) Performed At: [facility] [place], [place] [id number] Rush Farmer MD [id number]   Troponin I - Now Then Q6H     Status: None   Collection Time: 04/24/18  4:24 PM  Result Value Ref Range   Troponin I <0.03 <0.03 ng/mL    Comment: Performed at [facility], [address], [place], [place] [address]  Troponin I - Now Then Q6H     Status: None   Collection Time: 04/24/18  9:36 PM  Result Value Ref Range   Troponin I <0.03 <0.03 ng/mL    Comment: Performed at [facility], [address] [place], [place] [address]  Troponin I - Now Then Q6H     Status: None   Collection Time: 04/25/18  3:57 AM  Result Value Ref Range   Troponin I <0.03 <0.03 ng/mL    Comment: Performed at [facility], [address] [place], [place] [address]  Urinalysis, dipstick only     Status: Abnormal    Collection Time: 04/25/18  8:24 AM  Result Value Ref Range   Color, Urine YELLOW YELLOW   APPearance CLEAR CLEAR   Specific Gravity, Urine 1.003 (L) 1.005 - 1.030   pH 7.0 5.0 - 8.0   Glucose, UA NEGATIVE NEGATIVE mg/dL   Hgb urine dipstick NEGATIVE NEGATIVE   Bilirubin Urine NEGATIVE NEGATIVE   Ketones, ur NEGATIVE NEGATIVE mg/dL   Protein, ur NEGATIVE NEGATIVE mg/dL   Nitrite NEGATIVE NEGATIVE   Leukocytes, UA MODERATE (A) NEGATIVE    Comment: Performed at [facility], [address], [place], [place] [address]    ABGS No results for input(s): PHART, PO2ART, TCO2, HCO3 in the last 72 hours.  Invalid input(s): PCO2 CULTURES No results found for this or any previous visit (from the past 240 hour(s)). Studies/Results: Dg Chest Portable 1 View  Result Date: 04/24/2018 CLINICAL DATA:  Left-sided chest pain with coughing and shortness of breath. EXAM: PORTABLE CHEST 1 VIEW COMPARISON:  Chest x-ray dated February 03, 2018. FINDINGS: Stable cardiomegaly. Normal pulmonary vascularity. The lungs remain hyperinflated with emphysematous changes. No focal consolidation, pleural effusion, or pneumothorax. No acute osseous abnormality. IMPRESSION: 1. No active disease. 2. COPD. Electronically Signed   By: Titus Dubin M.D.   On: 04/24/2018 12:38    Medications:  Prior to Admission:  Medications Prior to Admission  Medication Sig Dispense Refill Last Dose  . albuterol (PROVENTIL) (2.5 MG/3ML) 0.083% nebulizer solution Take 3 mLs (2.5 mg total) by nebulization every 6 (six) hours as needed for wheezing or shortness of breath. 75 mL 12 04/24/2018 at Unknown time  . aspirin EC 81 MG tablet Take 81 mg by mouth every morning.    04/24/2018 at Unknown time  . atorvastatin (LIPITOR) 80 MG tablet Take 1 tablet (80 mg total) by mouth daily at 6 PM. 30 tablet 6 04/23/2018 at Unknown time  . carvedilol (COREG) 6.25 MG tablet TAKE ONE TABLET BY MOUTH 2 TIMES A DAY. 180 tablet 3 04/24/2018 at 0900  .  clorazepate (TRANXENE) 7.5 MG tablet Take 7.5 mg by mouth daily as needed for anxiety. For nerves   04/24/2018 at Unknown time  . ENTRESTO 24-26 MG TAKE 1 TABLET BY MOUTH TWICE  DAILY. 60 tablet 6 04/24/2018 at Unknown time  . meclizine (ANTIVERT) 25 MG tablet Take 1 or 2  Every 6 hours (Patient taking differently: Take 12.5-25 mg by mouth 4 (four) times daily as needed for dizziness. ) 100 tablet 3 Past Week at Unknown time  . OXYGEN Inhale 2 L into the lungs every evening.   04/24/2018 at Unknown time  . PROAIR HFA 108 (90 BASE) MCG/ACT inhaler Inhale 2 puffs into the lungs every 6 (six) hours as needed for wheezing or shortness of breath.    04/24/2018 at Unknown time  . SYMBICORT 160-4.5 MCG/ACT inhaler Inhale 2 puffs into the lungs 2 (two) times daily.   04/24/2018 at Unknown time  . traMADol (ULTRAM) 50 MG tablet Take 50 mg by mouth daily as needed for moderate pain or severe pain. Maximum dose= 8 tablets per day   04/23/2018 at Unknown time  . nitroGLYCERIN (NITROSTAT) 0.4 MG SL tablet Place 1 tablet (0.4 mg total) under the tongue every 5 (five) minutes x 3 doses as needed for chest pain. 25 tablet 12 unknown   Scheduled: . aspirin EC  81 mg Oral BH-q7a  . atorvastatin  80 mg Oral q1800  . azithromycin  250 mg Oral Daily  . carvedilol  6.25 mg Oral BID WC  . enoxaparin (LOVENOX) injection  40 mg Subcutaneous Q24H  . Influenza vac split quadrivalent PF  0.5 mL Intramuscular Tomorrow-1000  . ipratropium-albuterol  3 mL Nebulization Q6H  . methylPREDNISolone (SOLU-MEDROL) injection  40 mg Intravenous Q12H  . sacubitril-valsartan  1 tablet Oral BID   Continuous:  EHU:[id number], nitroGLYCERIN, traMADol  Assesment: She was admitted with COPD exacerbation and acute on chronic hypoxic respiratory failure.  She is on steroids antibiotics inhaled bronchodilators and she is improving but not ready for discharge.  She has chronic combined systolic and diastolic heart failure and that seems  stable  She is deconditioned and needs PT when she goes home Principal Problem:   COPD exacerbation (HCC) Active Problems:   Hypertension   Chronic combined systolic and diastolic CHF (congestive heart failure) (HCC)   Anxiety   Chronic pain   Acute on chronic respiratory failure with hypoxia (HCC)   Chronic stable angina (HCC)   Physical deconditioning    Plan: Continue treatments.  Not ready for discharge.    LOS: 0 days   Niraj Kudrna L 04/26/2018, 9:51 AM

## 2018-04-27 DIAGNOSIS — Z87442 Personal history of urinary calculi: Secondary | ICD-10-CM | POA: Diagnosis not present

## 2018-04-27 DIAGNOSIS — Z79891 Long term (current) use of opiate analgesic: Secondary | ICD-10-CM | POA: Diagnosis not present

## 2018-04-27 DIAGNOSIS — Z90710 Acquired absence of both cervix and uterus: Secondary | ICD-10-CM | POA: Diagnosis not present

## 2018-04-27 DIAGNOSIS — Z7951 Long term (current) use of inhaled steroids: Secondary | ICD-10-CM | POA: Diagnosis not present

## 2018-04-27 DIAGNOSIS — Z9049 Acquired absence of other specified parts of digestive tract: Secondary | ICD-10-CM | POA: Diagnosis not present

## 2018-04-27 DIAGNOSIS — Z9981 Dependence on supplemental oxygen: Secondary | ICD-10-CM | POA: Diagnosis not present

## 2018-04-27 DIAGNOSIS — Z23 Encounter for immunization: Secondary | ICD-10-CM | POA: Diagnosis not present

## 2018-04-27 DIAGNOSIS — Z888 Allergy status to other drugs, medicaments and biological substances status: Secondary | ICD-10-CM | POA: Diagnosis not present

## 2018-04-27 DIAGNOSIS — J9601 Acute respiratory failure with hypoxia: Secondary | ICD-10-CM | POA: Diagnosis not present

## 2018-04-27 DIAGNOSIS — F419 Anxiety disorder, unspecified: Secondary | ICD-10-CM | POA: Diagnosis present

## 2018-04-27 DIAGNOSIS — I252 Old myocardial infarction: Secondary | ICD-10-CM | POA: Diagnosis not present

## 2018-04-27 DIAGNOSIS — I25118 Atherosclerotic heart disease of native coronary artery with other forms of angina pectoris: Secondary | ICD-10-CM | POA: Diagnosis present

## 2018-04-27 DIAGNOSIS — Z8249 Family history of ischemic heart disease and other diseases of the circulatory system: Secondary | ICD-10-CM | POA: Diagnosis not present

## 2018-04-27 DIAGNOSIS — Z87891 Personal history of nicotine dependence: Secondary | ICD-10-CM | POA: Diagnosis not present

## 2018-04-27 DIAGNOSIS — Z79899 Other long term (current) drug therapy: Secondary | ICD-10-CM | POA: Diagnosis not present

## 2018-04-27 DIAGNOSIS — Z833 Family history of diabetes mellitus: Secondary | ICD-10-CM | POA: Diagnosis not present

## 2018-04-27 DIAGNOSIS — I110 Hypertensive heart disease with heart failure: Secondary | ICD-10-CM | POA: Diagnosis present

## 2018-04-27 DIAGNOSIS — J441 Chronic obstructive pulmonary disease with (acute) exacerbation: Secondary | ICD-10-CM | POA: Diagnosis present

## 2018-04-27 DIAGNOSIS — Z801 Family history of malignant neoplasm of trachea, bronchus and lung: Secondary | ICD-10-CM | POA: Diagnosis not present

## 2018-04-27 DIAGNOSIS — I5042 Chronic combined systolic (congestive) and diastolic (congestive) heart failure: Secondary | ICD-10-CM | POA: Diagnosis present

## 2018-04-27 DIAGNOSIS — G8929 Other chronic pain: Secondary | ICD-10-CM | POA: Diagnosis present

## 2018-04-27 DIAGNOSIS — J9621 Acute and chronic respiratory failure with hypoxia: Secondary | ICD-10-CM | POA: Diagnosis present

## 2018-04-27 DIAGNOSIS — Z7982 Long term (current) use of aspirin: Secondary | ICD-10-CM | POA: Diagnosis not present

## 2018-04-27 DIAGNOSIS — Z885 Allergy status to narcotic agent status: Secondary | ICD-10-CM | POA: Diagnosis not present

## 2018-04-27 DIAGNOSIS — I712 Thoracic aortic aneurysm, without rupture: Secondary | ICD-10-CM | POA: Diagnosis present

## 2018-04-27 LAB — URINE CULTURE: Culture: >=100000 — AB

## 2018-04-27 MED ORDER — MECLIZINE HCL 12.5 MG PO TABS
12.5000 mg | ORAL_TABLET | Freq: Three times a day (TID) | ORAL | Status: DC | PRN
  Administered 2018-04-27: 12.5 mg via ORAL
  Filled 2018-04-27: qty 1

## 2018-04-27 MED ORDER — MECLIZINE HCL 12.5 MG PO TABS: 25.0000 mg | ORAL_TABLET | Freq: Three times a day (TID) | ORAL | Status: DC | PRN

## 2018-04-27 NOTE — Progress Notes (Signed)
Subjective: She says her breathing is better but she is still coughing and congested.  She is dizzy this morning.  No other new complaints.  Objective: Vital signs in last 24 hours: Temp:  [97.6 F (36.4 C)-98.3 F (36.8 C)] 97.6 F (36.4 C) (12/15 0517) Pulse Rate:  [67-74] 74 (12/15 0517) Resp:  [18-20] 20 (12/15 0517) BP: (130-147)/(51-62) 147/62 (12/15 0517) SpO2:  [90 %-95 %] 93 % (12/15 0749) FiO2 (%):  [92 %] 92 % (12/14 1308) Weight change:  Last BM Date: 04/24/18  Intake/Output from previous day: 12/14 0701 - 12/15 0700 In: 480 [P.O.:480] Out: -   PHYSICAL EXAM General appearance: alert, cooperative and mild distress Resp: rhonchi bilaterally Cardio: regular rate and rhythm, S1, S2 normal, no murmur, click, rub or gallop GI: soft, non-tender; bowel sounds normal; no masses,  no organomegaly Extremities: extremities normal, atraumatic, no cyanosis or edema  Lab Results:  No results found for this or any previous visit (from the past 48 hour(s)).  ABGS No results for input(s): PHART, PO2ART, TCO2, HCO3 in the last 72 hours.  Invalid input(s): PCO2 CULTURES Recent Results (from the past 240 hour(s))  Urine Culture     Status: Abnormal   Collection Time: 04/25/18  8:24 AM  Result Value Ref Range Status   Specimen Description   Final    URINE, CLEAN CATCH Performed at [facility], [address], [place], [place] [address]    Special Requests   Final    NONE Performed at [facility], [address] [place], [place] [address]    Culture (A)  Final    >=100,000 COLONIES/mL MULTIPLE SPECIES PRESENT, SUGGEST RECOLLECTION   Report Status 04/27/2018 FINAL  Final   Studies/Results: No results found.  Medications:  Prior to Admission:  Medications Prior to Admission  Medication Sig Dispense Refill Last Dose  . albuterol (PROVENTIL) (2.5 MG/3ML) 0.083% nebulizer solution Take 3 mLs (2.5 mg total) by nebulization every 6 (six) hours as needed for wheezing or  shortness of breath. 75 mL 12 04/24/2018 at Unknown time  . aspirin EC 81 MG tablet Take 81 mg by mouth every morning.    04/24/2018 at Unknown time  . atorvastatin (LIPITOR) 80 MG tablet Take 1 tablet (80 mg total) by mouth daily at 6 PM. 30 tablet 6 04/23/2018 at Unknown time  . carvedilol (COREG) 6.25 MG tablet TAKE ONE TABLET BY MOUTH 2 TIMES A DAY. 180 tablet 3 04/24/2018 at 0900  . clorazepate (TRANXENE) 7.5 MG tablet Take 7.5 mg by mouth daily as needed for anxiety. For nerves   04/24/2018 at Unknown time  . ENTRESTO 24-26 MG TAKE 1 TABLET BY MOUTH TWICE DAILY. 60 tablet 6 04/24/2018 at Unknown time  . meclizine (ANTIVERT) 25 MG tablet Take 1 or 2  Every 6 hours (Patient taking differently: Take 12.5-25 mg by mouth 4 (four) times daily as needed for dizziness. ) 100 tablet 3 Past Week at Unknown time  . OXYGEN Inhale 2 L into the lungs every evening.   04/24/2018 at Unknown time  . PROAIR HFA 108 (90 BASE) MCG/ACT inhaler Inhale 2 puffs into the lungs every 6 (six) hours as needed for wheezing or shortness of breath.    04/24/2018 at Unknown time  . SYMBICORT 160-4.5 MCG/ACT inhaler Inhale 2 puffs into the lungs 2 (two) times daily.   04/24/2018 at Unknown time  . traMADol (ULTRAM) 50 MG tablet Take 50 mg by mouth daily as needed for moderate pain or severe  pain. Maximum dose= 8 tablets per day   04/23/2018 at Unknown time  . nitroGLYCERIN (NITROSTAT) 0.4 MG SL tablet Place 1 tablet (0.4 mg total) under the tongue every 5 (five) minutes x 3 doses as needed for chest pain. 25 tablet 12 unknown   Scheduled: . aspirin EC  81 mg Oral BH-q7a  . atorvastatin  80 mg Oral q1800  . azithromycin  250 mg Oral Daily  . carvedilol  6.25 mg Oral BID WC  . enoxaparin (LOVENOX) injection  40 mg Subcutaneous Q24H  . Influenza vac split quadrivalent PF  0.5 mL Intramuscular Tomorrow-1000  . ipratropium-albuterol  3 mL Nebulization Q6H  . methylPREDNISolone (SOLU-MEDROL) injection  40 mg Intravenous Q12H   . sacubitril-valsartan  1 tablet Oral BID   Continuous:  MBP:[id number], meclizine, nitroGLYCERIN, traMADol  Assesment: She has COPD exacerbation and she is getting better but still congested still coughing still short of breath.  She has dizziness which is acute on chronic  She has chronic combined systolic and diastolic heart failure stable  She has acute on chronic hypoxic respiratory failure on oxygen  She is deconditioned Principal Problem:   COPD exacerbation (HCC) Active Problems:   Hypertension   Chronic combined systolic and diastolic CHF (congestive heart failure) (HCC)   Anxiety   Chronic pain   Acute on chronic respiratory failure with hypoxia (HCC)   Chronic stable angina [facility])   Physical deconditioning    Plan: Restart Antivert.  Continue other treatments.  Probable discharge tomorrow    LOS: 0 days   Sailor Hevia L 04/27/2018, 10:18 AM

## 2018-04-28 MED ORDER — PREDNISONE 10 MG (21) PO TBPK: ORAL_TABLET | ORAL | 0 refills | Status: DC

## 2018-04-28 MED ORDER — AZITHROMYCIN 250 MG PO TABS: ORAL_TABLET | ORAL | 0 refills | Status: DC

## 2018-04-28 NOTE — Discharge Summary (Signed)
Physician Discharge Summary  Patient ID: [name] MRN: [id number] DOB/AGE: Apr 18, 1936 82 y.o. Primary Care Physician:Ana Liaw, Percell Miller, MD Admit date: 04/24/2018 Discharge date: 04/28/2018    Discharge Diagnoses:   Principal Problem:   COPD exacerbation [facility]) Active Problems:   Hypertension   Chronic combined systolic and diastolic CHF (congestive heart failure) (HCC)   Anxiety   Chronic pain   COPD (chronic obstructive pulmonary disease) (HCC)   Acute on chronic respiratory failure with hypoxia (HCC)   Chronic stable angina (HCC)   Physical deconditioning   Allergies as of 04/28/2018      Reactions   Plavix [clopidogrel Bisulfate] Itching   Severe itching   Montelukast Sodium    Pt states she felt a "headache and her eyes started to Burn "    Alprazolam Nausea And Vomiting   Codeine Nausea And Vomiting   Percodan [oxycodone-aspirin] Nausea And Vomiting   Valium Nausea And Vomiting      Medication List    TAKE these medications   aspirin EC 81 MG tablet Take 81 mg by mouth every morning.   atorvastatin 80 MG tablet Commonly known as:  LIPITOR Take 1 tablet (80 mg total) by mouth daily at 6 PM.   azithromycin 250 MG tablet Commonly known as:  ZITHROMAX Z-PAK Take by package instructions   carvedilol 6.25 MG tablet Commonly known as:  COREG TAKE ONE TABLET BY MOUTH 2 TIMES A DAY.   clorazepate 7.5 MG tablet Commonly known as:  TRANXENE Take 7.5 mg by mouth daily as needed for anxiety. For nerves   ENTRESTO 24-26 MG Generic drug:  sacubitril-valsartan TAKE 1 TABLET BY MOUTH TWICE DAILY.   meclizine 25 MG tablet Commonly known as:  ANTIVERT Take 1 or 2  Every 6 hours What changed:    how much to take  how to take this  when to take this  reasons to take this  additional instructions   nitroGLYCERIN 0.4 MG SL tablet Commonly known as:  NITROSTAT Place 1 tablet (0.4 mg total) under the tongue every 5 (five) minutes x 3 doses as needed for  chest pain.   OXYGEN Inhale 2 L into the lungs every evening.   predniSONE 10 MG (21) Tbpk tablet Commonly known as:  STERAPRED UNI-PAK 21 TAB Take by package instructions   PROAIR HFA 108 (90 Base) MCG/ACT inhaler Generic drug:  albuterol Inhale 2 puffs into the lungs every 6 (six) hours as needed for wheezing or shortness of breath.   albuterol (2.5 MG/3ML) 0.083% nebulizer solution Commonly known as:  PROVENTIL Take 3 mLs (2.5 mg total) by nebulization every 6 (six) hours as needed for wheezing or shortness of breath.   SYMBICORT 160-4.5 MCG/ACT inhaler Generic drug:  budesonide-formoterol Inhale 2 puffs into the lungs 2 (two) times daily.   traMADol 50 MG tablet Commonly known as:  ULTRAM Take 50 mg by mouth daily as needed for moderate pain or severe pain. Maximum dose= 8 tablets per day            Durable Medical Equipment  (From admission, onward)         Start     Ordered   04/25/18 1354  For home use only DME Walker rolling  Once    Question:  Patient needs a walker to treat with the following condition  Answer:  General weakness   04/25/18 1353          Discharged Condition: Improved    Consults: None  Significant  Diagnostic Studies: Dg Chest Portable 1 View  Result Date: 04/24/2018 CLINICAL DATA:  Left-sided chest pain with coughing and shortness of breath. EXAM: PORTABLE CHEST 1 VIEW COMPARISON:  Chest x-ray dated February 03, 2018. FINDINGS: Stable cardiomegaly. Normal pulmonary vascularity. The lungs remain hyperinflated with emphysematous changes. No focal consolidation, pleural effusion, or pneumothorax. No acute osseous abnormality. IMPRESSION: 1. No active disease. 2. COPD. Electronically Signed   By: Titus Dubin M.D.   On: 04/24/2018 12:38    Lab Results: Basic Metabolic Panel: No results for input(s): NA, K, CL, CO2, GLUCOSE, BUN, CREATININE, CALCIUM, MG, PHOS in the last 72 hours. Liver Function Tests: No results for input(s):  AST, ALT, ALKPHOS, BILITOT, PROT, ALBUMIN in the last 72 hours.   CBC: No results for input(s): WBC, NEUTROABS, HGB, HCT, MCV, PLT in the last 72 hours.  Recent Results (from the past 240 hour(s))  Urine Culture     Status: Abnormal   Collection Time: 04/25/18  8:24 AM  Result Value Ref Range Status   Specimen Description   Final    URINE, CLEAN CATCH Performed at [facility], [address], [place], [place] [address]    Special Requests   Final    NONE Performed at [facility], [address] [place], [place] [address]    Culture (A)  Final    >=100,000 COLONIES/mL MULTIPLE SPECIES PRESENT, SUGGEST RECOLLECTION   Report Status 04/27/2018 FINAL  Final     Hospital Course: This is an 82 year old who came to the emergency department because of shortness of breath.  She was found to have COPD exacerbation.  She was treated in the emergency department but did not improve enough for discharge.  She was treated with intravenous steroids inhaled bronchodilators oxygen and improved.  By the time of discharge she was back to baseline able to ambulate without any difficulty as far as her breathing was concerned in the halls.  Discharge Exam: Blood pressure (!) 126/57, pulse 67, temperature 98.4 F (36.9 C), temperature source Oral, resp. rate 16, height 5\' 2"  (1.575 m), weight 79.8 kg, SpO2 96 %. She is awake and alert.  Chest is clear.  Heart is regular.  Abdomen is soft  Disposition: Home with home health services  Discharge Instructions    Face-to-face encounter (required for Medicare/Medicaid patients)   Complete by:  As directed    I [name] certify that this patient is under my care and that I, or a nurse practitioner or physician's assistant working with me, had a face-to-face encounter that meets the physician face-to-face encounter requirements with this patient on 04/28/2018. The encounter with the patient was in whole, or in part for the following medical condition(s)  which is the primary reason for home health care (List medical condition): COPD exacerbation/deconditioning   The encounter with the patient was in whole, or in part, for the following medical condition, which is the primary reason for home health care:  COPD exacerbation/deconditioning   I certify that, based on my findings, the following services are medically necessary home health services:   Nursing Physical therapy     Reason for Medically Necessary Home Health Services:  Skilled Nursing- Change/Decline in Patient Status   My clinical findings support the need for the above services:  Shortness of breath with activity   Further, I certify that my clinical findings support that this patient is homebound due to:  Shortness of Breath with activity   Home Health   Complete by:  As directed    To provide the following care/treatments:   PT RN          Signed: Roslin Norwood L   04/28/2018, 8:52 AM

## 2018-04-28 NOTE — Progress Notes (Signed)
Subjective: Her breathing is much better.  She is not coughing as much.  She is been up and moving around.  She is not dizzy.  She has no complaints.  Exam: She is awake and alert.  She looks calm.  She has rhonchi on her chest exam.  Her heart is regular.  No neurological findings  Assessment: She was admitted with COPD exacerbation and she is much improved  Plan: Discharge home today

## 2018-04-28 NOTE — Progress Notes (Signed)
Patient discharging home. Vaughan Basta of [facility] notified.

## 2018-04-28 NOTE — Care Management Important Message (Signed)
Important Message  Patient Details  Name: [name] MRN: [id number] Date of Birth: April 07, 1936   Medicare Important Message Given:  Yes    Zamoria Boss, Chauncey Reading, RN 04/28/2018, 3:23 PM

## 2018-04-28 NOTE — Plan of Care (Signed)
  Problem: Education: Goal: Knowledge of General Education information will improve Description Including pain rating scale, medication(s)/side effects and non-pharmacologic comfort measures 04/28/2018 1437 by Rance Muir, RN Outcome: Adequate for Discharge 04/28/2018 1114 by Rance Muir, RN Outcome: Progressing   Problem: Health Behavior/Discharge Planning: Goal: Ability to manage health-related needs will improve 04/28/2018 1437 by Rance Muir, RN Outcome: Adequate for Discharge 04/28/2018 1114 by Rance Muir, RN Outcome: Progressing   Problem: Clinical Measurements: Goal: Ability to maintain clinical measurements within normal limits will improve 04/28/2018 1437 by Rance Muir, RN Outcome: Adequate for Discharge 04/28/2018 1114 by Rance Muir, RN Outcome: Progressing Goal: Will remain free from infection 04/28/2018 1437 by Rance Muir, RN Outcome: Adequate for Discharge 04/28/2018 1114 by Rance Muir, RN Outcome: Progressing Goal: Diagnostic test results will improve 04/28/2018 1437 by Rance Muir, RN Outcome: Adequate for Discharge 04/28/2018 1114 by Rance Muir, RN Outcome: Progressing Goal: Respiratory complications will improve 04/28/2018 1437 by Rance Muir, RN Outcome: Adequate for Discharge 04/28/2018 1114 by Rance Muir, RN Outcome: Progressing Goal: Cardiovascular complication will be avoided 04/28/2018 1437 by Rance Muir, RN Outcome: Adequate for Discharge 04/28/2018 1114 by Rance Muir, RN Outcome: Progressing   Problem: Activity: Goal: Risk for activity intolerance will decrease 04/28/2018 1437 by Rance Muir, RN Outcome: Adequate for Discharge 04/28/2018 1114 by Rance Muir, RN Outcome: Progressing   Problem: Nutrition: Goal: Adequate nutrition will be maintained 04/28/2018 1437 by Rance Muir, RN Outcome: Adequate for Discharge 04/28/2018 1114 by Rance Muir, RN Outcome: Progressing   Problem: Coping: Goal: Level of anxiety will decrease 04/28/2018 1437 by Rance Muir,  RN Outcome: Adequate for Discharge 04/28/2018 1114 by Rance Muir, RN Outcome: Progressing   Problem: Elimination: Goal: Will not experience complications related to bowel motility 04/28/2018 1437 by Rance Muir, RN Outcome: Adequate for Discharge 04/28/2018 1114 by Rance Muir, RN Outcome: Progressing Goal: Will not experience complications related to urinary retention 04/28/2018 1437 by Rance Muir, RN Outcome: Adequate for Discharge 04/28/2018 1114 by Rance Muir, RN Outcome: Progressing   Problem: Safety: Goal: Ability to remain free from injury will improve 04/28/2018 1437 by Rance Muir, RN Outcome: Adequate for Discharge 04/28/2018 1114 by Rance Muir, RN Outcome: Progressing   Problem: Skin Integrity: Goal: Risk for impaired skin integrity will decrease 04/28/2018 1437 by Rance Muir, RN Outcome: Adequate for Discharge 04/28/2018 1114 by Rance Muir, RN Outcome: Progressing

## 2018-04-28 NOTE — Plan of Care (Signed)

## 2018-04-28 NOTE — Discharge Instructions (Signed)

## 2018-04-28 NOTE — Progress Notes (Signed)
IV removed, site WNL.  Pt given d/c instructions - new prescriptions called into pharmacy.  Discussed all home medications (when, how, and why to take), patient verbalizes understanding. Discussed home care with patient, teachback completed. F/U appointment in place to be made by patient if needed - pt has [place] arranged and walker in room to take home.Pt is stable at this time. Pt waiting on daughter to pick her up.

## 2018-04-29 DIAGNOSIS — Z7982 Long term (current) use of aspirin: Secondary | ICD-10-CM | POA: Diagnosis not present

## 2018-04-29 DIAGNOSIS — Z955 Presence of coronary angioplasty implant and graft: Secondary | ICD-10-CM | POA: Diagnosis not present

## 2018-04-29 DIAGNOSIS — I110 Hypertensive heart disease with heart failure: Secondary | ICD-10-CM | POA: Diagnosis not present

## 2018-04-29 DIAGNOSIS — Z79899 Other long term (current) drug therapy: Secondary | ICD-10-CM | POA: Diagnosis not present

## 2018-04-29 DIAGNOSIS — Z791 Long term (current) use of non-steroidal anti-inflammatories (NSAID): Secondary | ICD-10-CM | POA: Diagnosis not present

## 2018-04-29 DIAGNOSIS — J441 Chronic obstructive pulmonary disease with (acute) exacerbation: Secondary | ICD-10-CM | POA: Diagnosis not present

## 2018-04-29 DIAGNOSIS — I25118 Atherosclerotic heart disease of native coronary artery with other forms of angina pectoris: Secondary | ICD-10-CM | POA: Diagnosis not present

## 2018-04-29 DIAGNOSIS — J9621 Acute and chronic respiratory failure with hypoxia: Secondary | ICD-10-CM | POA: Diagnosis not present

## 2018-04-29 DIAGNOSIS — I428 Other cardiomyopathies: Secondary | ICD-10-CM | POA: Diagnosis not present

## 2018-04-29 DIAGNOSIS — Z87891 Personal history of nicotine dependence: Secondary | ICD-10-CM | POA: Diagnosis not present

## 2018-04-29 DIAGNOSIS — I5042 Chronic combined systolic (congestive) and diastolic (congestive) heart failure: Secondary | ICD-10-CM | POA: Diagnosis not present

## 2018-04-29 DIAGNOSIS — Z7951 Long term (current) use of inhaled steroids: Secondary | ICD-10-CM | POA: Diagnosis not present

## 2018-05-01 ENCOUNTER — Other Ambulatory Visit: Payer: Self-pay

## 2018-05-01 DIAGNOSIS — I110 Hypertensive heart disease with heart failure: Secondary | ICD-10-CM | POA: Diagnosis not present

## 2018-05-01 DIAGNOSIS — I428 Other cardiomyopathies: Secondary | ICD-10-CM | POA: Diagnosis not present

## 2018-05-01 DIAGNOSIS — I5042 Chronic combined systolic (congestive) and diastolic (congestive) heart failure: Secondary | ICD-10-CM | POA: Diagnosis not present

## 2018-05-01 DIAGNOSIS — I25118 Atherosclerotic heart disease of native coronary artery with other forms of angina pectoris: Secondary | ICD-10-CM | POA: Diagnosis not present

## 2018-05-01 DIAGNOSIS — J9621 Acute and chronic respiratory failure with hypoxia: Secondary | ICD-10-CM | POA: Diagnosis not present

## 2018-05-01 DIAGNOSIS — J441 Chronic obstructive pulmonary disease with (acute) exacerbation: Secondary | ICD-10-CM | POA: Diagnosis not present

## 2018-05-01 NOTE — Patient Outreach (Signed)
[facility]) Care Management  05/01/2018  [name] 04-Nov-1935 [id number]   EMMI-GENERAL DISCHARGE  RED ON EMMI ALERT Day # 1 Date: 04/30/18 1:56 PM Red Alert Reason: "Scheduled follow-up? No"  Outreach attempt # 1 unsuccessful. No answer, unable to leave a voice message.    Plan: RN CM will make outreach attempt to patient within 3-4 business days. RN CM will send unsuccessful outreach letter to patient.       Barb Merino, RN,CCM [facility] Care Management Care Management Coordinator Direct Phone: [phone] Toll Free: [phone] Fax: [phone]

## 2018-05-02 ENCOUNTER — Other Ambulatory Visit: Payer: Medicare Other

## 2018-05-02 NOTE — Patient Outreach (Addendum)
[facility]) Care Management  05/02/2018  [name] 03-Apr-1936 [id number]   EMMI-General discharge  RED ON EMMI ALERT Day # 1 Date: 04/30/18 1:56 PM Red Alert Reason: "Scheduled follow-up? No"  Outreach attempt # 2 successful. Spoke with patient, HIPAA verified. Reviewed and addressed red alert. Patient states she called Dr. Luan Pulling office yesterday and left a voice message requesting a call back to schedule a f/u apt. She has not heard back. Patient is agreeable to RN CM contacting PCP office to request they call the pt to schedule an apt.   Call placed to Dr. Sinda Du office during the call, spoke with [facility], CMA, she will ask the scheduler to call the patient to assist with her post f/u apt. RN CM notified patient. Patient has all of her medications on hand, she is self-managing, she remembers to take her med's by establishing a daily habit. Pt admits she is afraid she may forget a dose as her memory has changed, however, she declines assistance or a pharmacy referral at this time. RN CM discussed pt using an audible pill box with a reminder system, she will consider for the future but declines needing at this time. Patient lives alone but has a daughter close by who "stops by everyday". Patient states she doesn't feel her children realize she is getting older and her health is declining. RN CM suggested she ask her daughter to attend her next PCP visit, pt will consider. She is currently paying $24 round trip for a taxi ride to her MD appts if she cannot find a neighbor to drive her. Patient is agreeable to having a [facility] SW contact her to provide resources for transportation, she prefers not to use [facility] if other options are available. Patient is currently wearing Oxygen via nasal cannula at 2 lt 24/7. She is able to monitor her pulse oximetry and verbalizes knowing when to call the doctor for new or worsening symptoms. She has a COPD zone tool left behind by the home  health nurse. She is currently receiving in-home SNV and PT biweekly x 4 weeks from [facility]. Patient is having difficulty with hair washing and bathing secondary to pain in her right shoulder from a pre-existing injury. RN CM instructed patient to contact her [facility] RN and inform she needs a CNA to assist with hygiene while [facility] services are in place. Patient will benefit from receiving resources from the [facility] SW for custodial care upon d/c from [facility], and the patient is agreeable. She denies having other questions and is very appreciative of the call. Patient is agreeable to a f/u call after the holidays.    Plan: RN CM will f/u with the patient in about 2 weeks to assess for CM needs. RN CM will send [facility] SW referral for possible assistance with resources related to transportation and in home support to assist with personal hygiene and light housekeeping.   Barb Merino, RN,CCM [facility] Care Management Care Management Coordinator Direct Phone: [phone] Toll Free: [phone] Fax: [phone]

## 2018-05-02 NOTE — Patient Outreach (Addendum)
[facility]) Care Management  05/02/2018  [name] 19-Sep-1935 [id number]   EMMI-General discharge PCP Collaboration  Outbound call placed to PCP office for Dr. Sinda Du [phone], spoke with Jacqlyn Larsen, CMA. RN CM informed [name] the patient left a vm yesterday for an office staff member to call her back to make her f/u appt and she did not receive a call back. Jacqlyn Larsen will ask the scheduler to call the patient back to make the appt. RN CM confirmed the PCP office has the patient's contact number.     Barb Merino, RN,CCM [facility] Care Management Care Management Coordinator Direct Phone: [phone] Toll Free: [phone] Fax: [phone]

## 2018-05-05 DIAGNOSIS — I428 Other cardiomyopathies: Secondary | ICD-10-CM | POA: Diagnosis not present

## 2018-05-05 DIAGNOSIS — I25118 Atherosclerotic heart disease of native coronary artery with other forms of angina pectoris: Secondary | ICD-10-CM | POA: Diagnosis not present

## 2018-05-05 DIAGNOSIS — J441 Chronic obstructive pulmonary disease with (acute) exacerbation: Secondary | ICD-10-CM | POA: Diagnosis not present

## 2018-05-05 DIAGNOSIS — I5042 Chronic combined systolic (congestive) and diastolic (congestive) heart failure: Secondary | ICD-10-CM | POA: Diagnosis not present

## 2018-05-05 DIAGNOSIS — I110 Hypertensive heart disease with heart failure: Secondary | ICD-10-CM | POA: Diagnosis not present

## 2018-05-05 DIAGNOSIS — J9621 Acute and chronic respiratory failure with hypoxia: Secondary | ICD-10-CM | POA: Diagnosis not present

## 2018-05-06 ENCOUNTER — Other Ambulatory Visit: Payer: Self-pay

## 2018-05-06 DIAGNOSIS — I428 Other cardiomyopathies: Secondary | ICD-10-CM | POA: Diagnosis not present

## 2018-05-06 DIAGNOSIS — I110 Hypertensive heart disease with heart failure: Secondary | ICD-10-CM | POA: Diagnosis not present

## 2018-05-06 DIAGNOSIS — J9621 Acute and chronic respiratory failure with hypoxia: Secondary | ICD-10-CM | POA: Diagnosis not present

## 2018-05-06 DIAGNOSIS — J441 Chronic obstructive pulmonary disease with (acute) exacerbation: Secondary | ICD-10-CM | POA: Diagnosis not present

## 2018-05-06 DIAGNOSIS — I25118 Atherosclerotic heart disease of native coronary artery with other forms of angina pectoris: Secondary | ICD-10-CM | POA: Diagnosis not present

## 2018-05-06 DIAGNOSIS — I5042 Chronic combined systolic (congestive) and diastolic (congestive) heart failure: Secondary | ICD-10-CM | POA: Diagnosis not present

## 2018-05-06 NOTE — Patient Outreach (Signed)
[facility]) Care Management  05/06/2018  Lavina Resor [place] 1935-08-31 [id number]   Initial outreach to Ms. Brigham regarding social work referral for transportation and custodial care resources.  BSW inquired about the need for transportation resources.  Ms. Klebba has the option to utilize Medicaid transportation but Ms. Jicha denied the need for transportation resources at this time.  Ms. Theys is receiving [place] services with [facility].  She verbalized understanding that she can receive custodial services in conjunction with skilled services and that she should speak with the [facility] RN about this.  However, she did state that "I would rather do it myself".   BSW inquired about her follow up appointment with Dr. Luan Pulling.  She was under the impression that she was scheduled to see him on 05/12/18 @ 2:00 however BSW did not see this appointment in [place].  A three way call was conducted and it was reported that the appointment was mistakenly not put in the system.  She was told that she can be seen that day though.   BSW is closing case due to Ms. Radde declining offered assistance/resources but will get involved again if additional needs arise.   Ronn Melena, BSW Social Worker [phone]

## 2018-05-08 DIAGNOSIS — I25118 Atherosclerotic heart disease of native coronary artery with other forms of angina pectoris: Secondary | ICD-10-CM | POA: Diagnosis not present

## 2018-05-08 DIAGNOSIS — J9621 Acute and chronic respiratory failure with hypoxia: Secondary | ICD-10-CM | POA: Diagnosis not present

## 2018-05-08 DIAGNOSIS — J441 Chronic obstructive pulmonary disease with (acute) exacerbation: Secondary | ICD-10-CM | POA: Diagnosis not present

## 2018-05-08 DIAGNOSIS — I428 Other cardiomyopathies: Secondary | ICD-10-CM | POA: Diagnosis not present

## 2018-05-08 DIAGNOSIS — I110 Hypertensive heart disease with heart failure: Secondary | ICD-10-CM | POA: Diagnosis not present

## 2018-05-08 DIAGNOSIS — I5042 Chronic combined systolic (congestive) and diastolic (congestive) heart failure: Secondary | ICD-10-CM | POA: Diagnosis not present

## 2018-05-12 DIAGNOSIS — I428 Other cardiomyopathies: Secondary | ICD-10-CM | POA: Diagnosis not present

## 2018-05-12 DIAGNOSIS — I5042 Chronic combined systolic (congestive) and diastolic (congestive) heart failure: Secondary | ICD-10-CM | POA: Diagnosis not present

## 2018-05-12 DIAGNOSIS — I5022 Chronic systolic (congestive) heart failure: Secondary | ICD-10-CM | POA: Diagnosis not present

## 2018-05-12 DIAGNOSIS — I10 Essential (primary) hypertension: Secondary | ICD-10-CM | POA: Diagnosis not present

## 2018-05-12 DIAGNOSIS — J9621 Acute and chronic respiratory failure with hypoxia: Secondary | ICD-10-CM | POA: Diagnosis not present

## 2018-05-12 DIAGNOSIS — J441 Chronic obstructive pulmonary disease with (acute) exacerbation: Secondary | ICD-10-CM | POA: Diagnosis not present

## 2018-05-12 DIAGNOSIS — I110 Hypertensive heart disease with heart failure: Secondary | ICD-10-CM | POA: Diagnosis not present

## 2018-05-12 DIAGNOSIS — I25118 Atherosclerotic heart disease of native coronary artery with other forms of angina pectoris: Secondary | ICD-10-CM | POA: Diagnosis not present

## 2018-05-12 DIAGNOSIS — I2510 Atherosclerotic heart disease of native coronary artery without angina pectoris: Secondary | ICD-10-CM | POA: Diagnosis not present

## 2018-05-13 DIAGNOSIS — I428 Other cardiomyopathies: Secondary | ICD-10-CM | POA: Diagnosis not present

## 2018-05-13 DIAGNOSIS — J441 Chronic obstructive pulmonary disease with (acute) exacerbation: Secondary | ICD-10-CM | POA: Diagnosis not present

## 2018-05-13 DIAGNOSIS — J9621 Acute and chronic respiratory failure with hypoxia: Secondary | ICD-10-CM | POA: Diagnosis not present

## 2018-05-13 DIAGNOSIS — I5042 Chronic combined systolic (congestive) and diastolic (congestive) heart failure: Secondary | ICD-10-CM | POA: Diagnosis not present

## 2018-05-13 DIAGNOSIS — I110 Hypertensive heart disease with heart failure: Secondary | ICD-10-CM | POA: Diagnosis not present

## 2018-05-13 DIAGNOSIS — I25118 Atherosclerotic heart disease of native coronary artery with other forms of angina pectoris: Secondary | ICD-10-CM | POA: Diagnosis not present

## 2018-05-15 ENCOUNTER — Other Ambulatory Visit: Payer: Self-pay

## 2018-05-15 NOTE — Patient Outreach (Signed)
[facility]) Care Management  05/15/2018  [name] 18-Feb-1936 [id number]    EMMI-General discharge FOLLOW-UP CALL  RED ON EMMI ALERT Day # 1 Date: 04/30/18 1:56 PM Red Alert Reason: "Scheduled follow-up? No"  Outreach FOLLOW-UP attempt # 1 successful.   Spoke to patient, verified HIPAA.   Status Update: Patient states she is doing much better. She is dusting her house today and is balancing her activity with rest. She states home health will discharge her in about 1 week. Patient f/u with Dr. Luan Pulling a few days ago, she continues to have some issues with balance and gait. RN CM encouraged patient to wear supportive shoes while in her home and to use her cane or walker at all times for support. RN CM encouraged patient to keep floors clutter free and to remove scatter rugs and to keep her rooms well lit, especially when getting up during the nighttime to help avoid falls. RN CM encouraged patient to ask the PT for additional visits if needed. RN CM encouraged patient to drink plenty of fluids staying within her recommended fluid intake to stay well hydrated and to report increased HR to her Cardiologist if ongoing with activity. Patient verbalizes understanding. She will not utilize custodial care at this time due to wanting to stay independent and feels she is managing better with improved health. She would like resources for an emergency alert life line. RN CM advised a [facility] SW will contact her to provide resources. RN CM advised patient she will not receive additional RN CM calls due to having no CM needs at this time. She verbalizes understanding and is very appreciative of the f/u call today.    Plan: RN CM will close case due to patient is stable and no further CM needs are identified at this time. Message sent to Bertha requesting additional contact to patient to discuss resources for an emergency alert life line.      Barb Merino, RN,CCM [facility] Care  Management Care Management Coordinator Direct Phone: [phone] Toll Free: [phone] Fax: [phone]

## 2018-05-16 ENCOUNTER — Other Ambulatory Visit: Payer: Self-pay

## 2018-05-16 NOTE — Patient Outreach (Signed)
[facility]) Care Management  05/16/2018  Amorita Vanrossum Clouatre 01/27/1936 [id number]   Patient requested outreach regarding information on life alert systems.  BSW inquired about affordable price range.  BSW agreed to mail a list of systems in that range.  Will follow up within the next two weeks to ensure receipt.  Ronn Melena, BSW Social Worker [phone]

## 2018-05-19 DIAGNOSIS — I110 Hypertensive heart disease with heart failure: Secondary | ICD-10-CM | POA: Diagnosis not present

## 2018-05-19 DIAGNOSIS — J9621 Acute and chronic respiratory failure with hypoxia: Secondary | ICD-10-CM | POA: Diagnosis not present

## 2018-05-19 DIAGNOSIS — J441 Chronic obstructive pulmonary disease with (acute) exacerbation: Secondary | ICD-10-CM | POA: Diagnosis not present

## 2018-05-19 DIAGNOSIS — I25118 Atherosclerotic heart disease of native coronary artery with other forms of angina pectoris: Secondary | ICD-10-CM | POA: Diagnosis not present

## 2018-05-19 DIAGNOSIS — I5042 Chronic combined systolic (congestive) and diastolic (congestive) heart failure: Secondary | ICD-10-CM | POA: Diagnosis not present

## 2018-05-19 DIAGNOSIS — I428 Other cardiomyopathies: Secondary | ICD-10-CM | POA: Diagnosis not present

## 2018-05-22 DIAGNOSIS — I5042 Chronic combined systolic (congestive) and diastolic (congestive) heart failure: Secondary | ICD-10-CM | POA: Diagnosis not present

## 2018-05-22 DIAGNOSIS — J9621 Acute and chronic respiratory failure with hypoxia: Secondary | ICD-10-CM | POA: Diagnosis not present

## 2018-05-22 DIAGNOSIS — I110 Hypertensive heart disease with heart failure: Secondary | ICD-10-CM | POA: Diagnosis not present

## 2018-05-22 DIAGNOSIS — J441 Chronic obstructive pulmonary disease with (acute) exacerbation: Secondary | ICD-10-CM | POA: Diagnosis not present

## 2018-05-22 DIAGNOSIS — I428 Other cardiomyopathies: Secondary | ICD-10-CM | POA: Diagnosis not present

## 2018-05-22 DIAGNOSIS — I25118 Atherosclerotic heart disease of native coronary artery with other forms of angina pectoris: Secondary | ICD-10-CM | POA: Diagnosis not present

## 2018-05-27 ENCOUNTER — Ambulatory Visit: Payer: Self-pay

## 2018-05-27 ENCOUNTER — Other Ambulatory Visit: Payer: Self-pay

## 2018-05-27 NOTE — Patient Outreach (Signed)
[facility]) Care Management  05/27/2018  Natasja Niday Trow August 02, 1935 [id number]   Follow up outreach to Ms. Morlock.  She confirmed receipt of low cost life alert systems that were mailed to her on 05/16/18. Ms. Monceaux plans to call systems provided regarding style and cost.   Ronn Melena, [place] Worker [phone]

## 2018-05-30 DIAGNOSIS — I428 Other cardiomyopathies: Secondary | ICD-10-CM | POA: Diagnosis not present

## 2018-05-30 DIAGNOSIS — J9621 Acute and chronic respiratory failure with hypoxia: Secondary | ICD-10-CM | POA: Diagnosis not present

## 2018-05-30 DIAGNOSIS — I110 Hypertensive heart disease with heart failure: Secondary | ICD-10-CM | POA: Diagnosis not present

## 2018-05-30 DIAGNOSIS — J441 Chronic obstructive pulmonary disease with (acute) exacerbation: Secondary | ICD-10-CM | POA: Diagnosis not present

## 2018-05-30 DIAGNOSIS — I25118 Atherosclerotic heart disease of native coronary artery with other forms of angina pectoris: Secondary | ICD-10-CM | POA: Diagnosis not present

## 2018-05-30 DIAGNOSIS — I5042 Chronic combined systolic (congestive) and diastolic (congestive) heart failure: Secondary | ICD-10-CM | POA: Diagnosis not present

## 2018-07-10 DIAGNOSIS — J449 Chronic obstructive pulmonary disease, unspecified: Secondary | ICD-10-CM | POA: Diagnosis not present

## 2018-07-10 DIAGNOSIS — I25119 Atherosclerotic heart disease of native coronary artery with unspecified angina pectoris: Secondary | ICD-10-CM | POA: Diagnosis not present

## 2018-07-10 DIAGNOSIS — J9611 Chronic respiratory failure with hypoxia: Secondary | ICD-10-CM | POA: Diagnosis not present

## 2018-07-10 DIAGNOSIS — I10 Essential (primary) hypertension: Secondary | ICD-10-CM | POA: Diagnosis not present

## 2018-07-25 ENCOUNTER — Encounter (HOSPITAL_COMMUNITY): Payer: Self-pay | Admitting: Emergency Medicine

## 2018-07-25 ENCOUNTER — Other Ambulatory Visit: Payer: Self-pay

## 2018-07-25 ENCOUNTER — Emergency Department (HOSPITAL_COMMUNITY): Payer: Medicare Other

## 2018-07-25 ENCOUNTER — Emergency Department (HOSPITAL_COMMUNITY)
Admission: EM | Admit: 2018-07-25 | Discharge: 2018-07-25 | Disposition: A | Discharge status: Home / Self Care | Payer: Medicare Other | Attending: Emergency Medicine | Admitting: Emergency Medicine

## 2018-07-25 DIAGNOSIS — I110 Hypertensive heart disease with heart failure: Secondary | ICD-10-CM | POA: Insufficient documentation

## 2018-07-25 DIAGNOSIS — Z79899 Other long term (current) drug therapy: Secondary | ICD-10-CM | POA: Insufficient documentation

## 2018-07-25 DIAGNOSIS — J069 Acute upper respiratory infection, unspecified: Secondary | ICD-10-CM | POA: Diagnosis not present

## 2018-07-25 DIAGNOSIS — R062 Wheezing: Secondary | ICD-10-CM | POA: Diagnosis not present

## 2018-07-25 DIAGNOSIS — J449 Chronic obstructive pulmonary disease, unspecified: Secondary | ICD-10-CM | POA: Insufficient documentation

## 2018-07-25 DIAGNOSIS — I5042 Chronic combined systolic (congestive) and diastolic (congestive) heart failure: Secondary | ICD-10-CM | POA: Insufficient documentation

## 2018-07-25 DIAGNOSIS — Z7982 Long term (current) use of aspirin: Secondary | ICD-10-CM | POA: Diagnosis not present

## 2018-07-25 DIAGNOSIS — Z87891 Personal history of nicotine dependence: Secondary | ICD-10-CM | POA: Diagnosis not present

## 2018-07-25 DIAGNOSIS — R0602 Shortness of breath: Secondary | ICD-10-CM | POA: Diagnosis not present

## 2018-07-25 DIAGNOSIS — R0902 Hypoxemia: Secondary | ICD-10-CM | POA: Diagnosis not present

## 2018-07-25 DIAGNOSIS — R05 Cough: Secondary | ICD-10-CM | POA: Diagnosis not present

## 2018-07-25 DIAGNOSIS — I2510 Atherosclerotic heart disease of native coronary artery without angina pectoris: Secondary | ICD-10-CM | POA: Diagnosis not present

## 2018-07-25 DIAGNOSIS — I447 Left bundle-branch block, unspecified: Secondary | ICD-10-CM | POA: Diagnosis not present

## 2018-07-25 MED ORDER — IPRATROPIUM-ALBUTEROL 0.5-2.5 (3) MG/3ML IN SOLN
3.0000 mL | Freq: Once | RESPIRATORY_TRACT | Status: AC
  Administered 2018-07-25: 3 mL via RESPIRATORY_TRACT
  Filled 2018-07-25: qty 3

## 2018-07-25 MED ORDER — METHYLPREDNISOLONE SODIUM SUCC 125 MG IJ SOLR: 80.0000 mg | Freq: Once | INTRAMUSCULAR | Status: DC

## 2018-07-25 NOTE — ED Provider Notes (Signed)
[place] Provider Note   CSN: [id number] Arrival date & time: 07/25/18  4098    History   Chief Complaint Chief Complaint  Patient presents with  . Shortness of Breath    HPI [name] is a 83 y.o. female.     Productive cough, shortness of breath for the past 2 to 3 days.  Patient uses supplemental oxygen at home.  Nursing notes reveal O2 sat 79% at home, but they have been in the high 90s in the emergency department throughout her entire course.  Dyspneic on exertion.  No substernal chest pain, rusty sputum, mental status changes.  Severity is moderate.  Her primary care doctor has called her in antibiotics and steroids.     Past Medical History:  Diagnosis Date  . Anxiety   . Cervical disc disorder with myelopathy, unspecified cervical region   . Chronic systolic (congestive) heart failure ([place])   . COPD (chronic obstructive pulmonary disease) ([place])   . Essential hypertension   . Hemorrhoids   . Liver mass   . Lung, cysts, congenital    Left lung cyst  . Myocardial infarction ([place])   . Nephrolithiasis    Embedded  . Nonischemic cardiomyopathy ([place])    LVEF 35-40% 2015  . On home O2   . Osteoarthritis   . Thoracic ascending aortic aneurysm (H[facility])    4.3 cm April 2016    Patient Active Problem List   Diagnosis Date Noted  . Acute on chronic respiratory failure with hypoxia ([place]) 04/24/2018  . Chronic stable angina ([place]) 04/24/2018  . Physical deconditioning 04/24/2018  . CAD (coronary artery disease) 02/03/2018  . Ischemic cardiomyopathy 11/25/2017  . Dizziness 04/01/2017  . COPD (chronic obstructive pulmonary disease) ([place]) 04/01/2017  . CAD S/P percutaneous coronary angioplasty 04/01/2017  . NSTEMI (non-ST elevated myocardial infarction) ([place]) 11/23/2016  . Hypertensive cardiovascular disease 10/03/2015  . Protein-calorie malnutrition, severe 07/13/2015  . Demand ischemia of myocardium ([place]) 07/13/2015  . Nonischemic  cardiomyopathy ([place])   . Atypical chest pain   . Left bundle branch block   . COPD exacerbation ([place]) 07/11/2015  . Flu-like symptoms 07/11/2015  . Elevated troponin 07/11/2015  . Acute bronchitis 07/11/2015  . GERD (gastroesophageal reflux disease) 07/11/2015  . Bronchospasm   . Ascending aortic aneurysm ([place]) 01/27/2015  . Chronic combined systolic and diastolic CHF (congestive heart failure) ([place]) 01/27/2015  . Chest pain at rest 01/27/2015  . Essential hypertension 01/27/2015  . Anxiety 01/27/2015  . Chronic pain 01/27/2015  . Chest pain 08/20/2013  . Hypertension 08/20/2013  . Contusion of left knee 08/07/2012  . Sprain of wrist 08/07/2012  . Liver mass 05/21/2011  . Abdominal pain 05/21/2011  . Bronchitis 05/21/2011  . De Quervain's disease (tenosynovitis) 04/02/2011  . SHOULDER PAIN 01/19/2009  . CERVICALGIA 01/19/2009  . IMPINGEMENT SYNDROME 01/19/2009    Past Surgical History:  Procedure Laterality Date  . Benign breast tumors    . CHOLECYSTECTOMY    . COLONOSCOPY    . COLONOSCOPY N/A 09/22/2014   Procedure: COLONOSCOPY;  Surgeon: Rogene Houston, MD;  [facility];  Service: Endoscopy;  Laterality: N/A;  830 -- to be done in OR under fluoro  . Complete hysterectomy    . CORONARY STENT INTERVENTION N/A 11/23/2016   Procedure: Coronary Stent Intervention;  Surgeon: Martinique, Peter M, MD;  Location: [place] CV LAB;  Service: Cardiovascular;  Laterality: N/A;  . LEFT HEART CATH AND CORONARY ANGIOGRAPHY N/A 11/23/2016  Procedure: Left Heart Cath and Coronary Angiography;  Surgeon: Martinique, Peter M, MD;  Location: [place] CV LAB;  Service: Cardiovascular;  Laterality: N/A;  . TONSILLECTOMY AND ADENOIDECTOMY       OB History   No obstetric history on file.      Home Medications    Prior to Admission medications   Medication Sig Start Date End Date Taking? Authorizing Provider  albuterol (PROVENTIL) (2.5 MG/3ML) 0.083% nebulizer solution Take 3 mLs  (2.5 mg total) by nebulization every 6 (six) hours as needed for wheezing or shortness of breath. 07/13/15   Sinda Du, MD  aspirin EC 81 MG tablet Take 81 mg by mouth every morning.     [provider]  atorvastatin (LIPITOR) 80 MG tablet Take 1 tablet (80 mg total) by mouth daily at 6 PM. 11/24/16   Bhagat, [place], PA  azithromycin (ZITHROMAX Z-PAK) 250 MG tablet Take by package instructions 04/28/18   Sinda Du, MD  carvedilol (COREG) 6.25 MG tablet TAKE ONE TABLET BY MOUTH 2 TIMES A DAY. 01/20/18   Herminio Commons, MD  clorazepate (TRANXENE) 7.5 MG tablet Take 7.5 mg by mouth daily as needed for anxiety. For nerves    [provider]  ENTRESTO 24-26 MG TAKE 1 TABLET BY MOUTH TWICE DAILY. 11/29/17   Herminio Commons, MD  meclizine (ANTIVERT) 25 MG tablet Take 1 or 2  Every 6 hours Patient taking differently: Take 12.5-25 mg by mouth 4 (four) times daily as needed for dizziness.  04/03/17   Sinda Du, MD  nitroGLYCERIN (NITROSTAT) 0.4 MG SL tablet Place 1 tablet (0.4 mg total) under the tongue every 5 (five) minutes x 3 doses as needed for chest pain. 02/05/18   Sinda Du, MD  OXYGEN Inhale 2 L into the lungs every evening.    [provider]  predniSONE (STERAPRED UNI-PAK 21 TAB) 10 MG (21) TBPK tablet Take by package instructions 04/28/18   Sinda Du, MD  PROAIR HFA 108 (90 BASE) MCG/ACT inhaler Inhale 2 puffs into the lungs every 6 (six) hours as needed for wheezing or shortness of breath.  02/08/14   [provider]  SYMBICORT 160-4.5 MCG/ACT inhaler Inhale 2 puffs into the lungs 2 (two) times daily. 09/23/17   [provider]  traMADol (ULTRAM) 50 MG tablet Take 50 mg by mouth daily as needed for moderate pain or severe pain. Maximum dose= 8 tablets per day    [provider]    Family History Family History  Problem Relation Age of Onset  . Heart disease Mother   . Aneurysm Father   . Lung cancer  Brother   . Heart disease Sister   . Diabetes Brother   . Heart disease Brother     Social History Social History   Tobacco Use  . Smoking status: Former Smoker    Packs/day: 0.25    Years: 31.00    Pack years: 7.75    Types: Cigarettes    Start date: 05/14/1977    Last attempt to quit: 05/28/2008    Years since quitting: 10.1  . Smokeless tobacco: Never Used  Substance Use Topics  . Alcohol use: No    Alcohol/week: 0.0 standard drinks  . Drug use: No     Allergies   Plavix [clopidogrel bisulfate]; Montelukast sodium; Alprazolam; Codeine; Percodan [oxycodone-aspirin]; and Valium   Review of Systems Review of Systems  All other systems reviewed and are negative.    Physical Exam Updated Vital Signs  BP (!) 141/98 (BP Location: Right Arm)   Pulse 76   Temp 98.3 F (36.8 C) (Oral)   Resp (!) 22   Ht 5\' 2"  (1.575 m)   Wt 78.9 kg   SpO2 98%   BMI 31.83 kg/m   Physical Exam Vitals signs and nursing note reviewed.  Constitutional:      Appearance: She is well-developed.     Comments: NAD;  No resp distress  HENT:     Head: Normocephalic and atraumatic.  Eyes:     Conjunctiva/sclera: Conjunctivae normal.  Neck:     Musculoskeletal: Neck supple.  Cardiovascular:     Rate and Rhythm: Normal rate and regular rhythm.  Pulmonary:     Effort: Pulmonary effort is normal.     Breath sounds: Normal breath sounds.  Abdominal:     General: Bowel sounds are normal.     Palpations: Abdomen is soft.  Musculoskeletal: Normal range of motion.  Skin:    General: Skin is warm and dry.  Neurological:     Mental Status: She is alert and oriented to person, place, and time.  Psychiatric:        Behavior: Behavior normal.      ED Treatments / Results  Labs (all labs ordered are listed, but only abnormal results are displayed) Labs Reviewed - No data to display  EKG EKG Interpretation  Date/Time:  Friday July 25 2018 08:11:11 EDT Ventricular Rate:  76 PR  Interval:    QRS Duration: 170 QT Interval:  428 QTC Calculation: 482 R Axis:   -29 Text Interpretation:  Sinus rhythm Left bundle branch block Confirmed by Nat Christen [phone]) on 07/25/2018 8:14:22 AM Also confirmed by Nat Christen [phone])  on 07/25/2018 8:36:27 AM Also confirmed by Nat Christen [phone])  on 07/25/2018 9:05:05 AM   Radiology Dg Chest 2 View  Result Date: 07/25/2018 CLINICAL DATA:  Shortness of breath.  Productive cough. EXAM: CHEST - 2 VIEW COMPARISON:  04/24/2018. FINDINGS: Mediastinum hilar structures normal. Stable cardiomegaly with normal pulmonary vascularity. No focal infiltrate. No pleural effusion or pneumothorax. No acute bony abnormality. Prior cervical spine fusion. Left shoulder replacement. Surgical clips upper abdomen. IMPRESSION: 1.  Stable cardiomegaly. 2.  No acute pulmonary disease. Electronically Signed   By: Marcello Moores  Register   On: 07/25/2018 08:48    Procedures Procedures (including critical care time)  Medications Ordered in ED Medications  ipratropium-albuterol (DUONEB) 0.5-2.5 (3) MG/3ML nebulizer solution 3 mL (3 mLs Nebulization Given 07/25/18 0900)     Initial Impression / Assessment and Plan / ED Course  I have reviewed the triage vital signs and the nursing notes.  Pertinent labs & imaging results that were available during my care of the patient were reviewed by me and considered in my medical decision making (see chart for details).       Patient is no acute distress.  Chest x-ray negative.  No obvious cardiac issue.  History and physical most consistent with uncomplicated URI.  Patient does not fit criteria for admission.  Final Clinical Impressions(s) / ED Diagnoses   Final diagnoses:  Upper respiratory tract infection, unspecified type    ED Discharge Orders    None       Nat Christen, MD 07/25/18 1003

## 2018-07-25 NOTE — ED Notes (Signed)
Ems gave the patient 125mg  of solumederol and a breathing treatment enroute.

## 2018-07-25 NOTE — Discharge Instructions (Addendum)
Chest x-ray shows no pneumonia.  Continue with the medication that your primary care doctor ordered.  You can use your supplemental oxygen at home on a regular basis if you are short of breath.

## 2018-07-25 NOTE — ED Triage Notes (Signed)
SOB for last couple days.  Productive cough (green thick).  Sats 79% on O2 @2L /M on 1st responders arrival.  Placed on NRB increased to 99%.  Pt placed on n/c via EMS Sats 98%.  Denies any pain at this time.

## 2018-08-14 DIAGNOSIS — R040 Epistaxis: Secondary | ICD-10-CM | POA: Diagnosis not present

## 2018-08-14 DIAGNOSIS — J449 Chronic obstructive pulmonary disease, unspecified: Secondary | ICD-10-CM | POA: Diagnosis not present

## 2018-09-08 DIAGNOSIS — J449 Chronic obstructive pulmonary disease, unspecified: Secondary | ICD-10-CM | POA: Diagnosis not present

## 2018-09-08 DIAGNOSIS — I2510 Atherosclerotic heart disease of native coronary artery without angina pectoris: Secondary | ICD-10-CM | POA: Diagnosis not present

## 2018-09-08 DIAGNOSIS — J9611 Chronic respiratory failure with hypoxia: Secondary | ICD-10-CM | POA: Diagnosis not present

## 2018-09-08 DIAGNOSIS — I5022 Chronic systolic (congestive) heart failure: Secondary | ICD-10-CM | POA: Diagnosis not present

## 2018-09-09 ENCOUNTER — Telehealth: Payer: Self-pay | Admitting: Cardiovascular Disease

## 2018-09-09 NOTE — Telephone Encounter (Signed)
Virtual Visit Pre-Appointment Phone Call  "(Name), I am calling you today to discuss your upcoming appointment. We are currently trying to limit exposure to the virus that causes COVID-19 by seeing patients at home rather than in the office."  1. "What is the BEST phone number to call the day of the visit?" - include this in appointment notes  2. Do you have or have access to (through a family member/friend) a smartphone with video capability that we can use for your visit?" a. If yes - list this number in appt notes as cell (if different from BEST phone #) and list the appointment type as a VIDEO visit in appointment notes b. If no - list the appointment type as a PHONE visit in appointment notes  3. Confirm consent - "In the setting of the current Covid19 crisis, you are scheduled for a (phone or video) visit with your provider on (date) at (time).  Just as we do with many in-office visits, in order for you to participate in this visit, we must obtain consent.  If you'd like, I can send this to your mychart (if signed up) or email for you to review.  Otherwise, I can obtain your verbal consent now.  All virtual visits are billed to your insurance company just like a normal visit would be.  By agreeing to a virtual visit, we'd like you to understand that the technology does not allow for your provider to perform an examination, and thus may limit your provider's ability to fully assess your condition. If your provider identifies any concerns that need to be evaluated in person, we will make arrangements to do so.  Finally, though the technology is pretty good, we cannot assure that it will always work on either your or our end, and in the setting of a video visit, we may have to convert it to a phone-only visit.  In either situation, we cannot ensure that we have a secure connection.  Are you willing to proceed?" STAFF: Did the patient verbally acknowledge consent to telehealth visit? Document  YES/NO here: Yes  4. Advise patient to be prepared - "Two hours prior to your appointment, go ahead and check your blood pressure, pulse, oxygen saturation, and your weight (if you have the equipment to check those) and write them all down. When your visit starts, your provider will ask you for this information. If you have an Apple Watch or Kardia device, please plan to have heart rate information ready on the day of your appointment. Please have a pen and paper handy nearby the day of the visit as well."  5. Give patient instructions for MyChart download to smartphone OR [email or web address] as below if video visit (depending on what platform provider is using)  6. Inform patient they will receive a phone call 15 minutes prior to their appointment time (may be from unknown caller ID) so they should be prepared to answer    [name] has been deemed a candidate for a follow-up tele-health visit to limit community exposure during the Covid-19 pandemic. I spoke with the patient via phone to ensure availability of phone/video source, confirm preferred email & phone number, and discuss instructions and expectations.  I reminded [name] to be prepared with any vital sign and/or heart rhythm information that could potentially be obtained via home monitoring, at the time of her visit. I reminded [name] to expect a phone call prior to  her visit.  Bertram Gala Goins 09/09/2018 10:46 AM

## 2018-09-12 ENCOUNTER — Telehealth (INDEPENDENT_AMBULATORY_CARE_PROVIDER_SITE_OTHER): Payer: Medicare Other | Admitting: Cardiovascular Disease

## 2018-09-12 ENCOUNTER — Other Ambulatory Visit: Payer: Self-pay | Admitting: Cardiovascular Disease

## 2018-09-12 ENCOUNTER — Encounter: Payer: Self-pay | Admitting: Cardiovascular Disease

## 2018-09-12 VITALS — BP 142/70 | HR 90 | Temp 97.6°F | Ht 62.0 in | Wt 170.0 lb

## 2018-09-12 DIAGNOSIS — R069 Unspecified abnormalities of breathing: Secondary | ICD-10-CM | POA: Diagnosis not present

## 2018-09-12 DIAGNOSIS — E785 Hyperlipidemia, unspecified: Secondary | ICD-10-CM

## 2018-09-12 DIAGNOSIS — I25118 Atherosclerotic heart disease of native coronary artery with other forms of angina pectoris: Secondary | ICD-10-CM | POA: Diagnosis not present

## 2018-09-12 DIAGNOSIS — I10 Essential (primary) hypertension: Secondary | ICD-10-CM

## 2018-09-12 DIAGNOSIS — I712 Thoracic aortic aneurysm, without rupture: Secondary | ICD-10-CM

## 2018-09-12 DIAGNOSIS — Z955 Presence of coronary angioplasty implant and graft: Secondary | ICD-10-CM

## 2018-09-12 DIAGNOSIS — I5042 Chronic combined systolic (congestive) and diastolic (congestive) heart failure: Secondary | ICD-10-CM

## 2018-09-12 DIAGNOSIS — I7121 Aneurysm of the ascending aorta, without rupture: Secondary | ICD-10-CM

## 2018-09-12 NOTE — Progress Notes (Signed)
Virtual Visit via Telephone Note   This visit type was conducted due to national recommendations for restrictions regarding the COVID-19 Pandemic (e.g. social distancing) in an effort to limit this patient's exposure and mitigate transmission in our community.  Due to her co-morbid illnesses, this patient is at least at moderate risk for complications without adequate follow up.  This format is felt to be most appropriate for this patient at this time.  The patient did not have access to video technology/had technical difficulties with video requiring transitioning to audio format only (telephone).  All issues noted in this document were discussed and addressed.  No physical exam could be performed with this format.  Please refer to the patient's chart for her  consent to telehealth for [facility].   Date:  09/12/2018   ID:  [name], DOB 1935/09/10, MRN [id number]  Patient Location: Home Provider Location: Home  PCP:  [name] Du, MD  Cardiologist:  Kate Sable, MD  Electrophysiologist:  None   Evaluation Performed:  Follow-Up Visit  Chief Complaint:  CAD  History of Present Illness:    [name] is a 83 y.o. female with a h/o CA and COPD with frequent exacerbations requiring hospitalizations.  She underwent drug-eluting stent placement to the proximal and mid LAD as well as the first marginal branch on 11/23/16.  She has chronic exertional dyspnea which is stable. Has had some HR fluctuations and hypoxia. Had some "burning sensations" in her chest on occasion. Hasn't had to use nitro. Episodes don't last long. She sleeps with 2L oxygen. Says her "memory isn't as good as it used to be". She lives by herself.  The patient does not have symptoms concerning for COVID-19 infection (fever, chills, cough, or new shortness of breath).    Past Medical History:  Diagnosis Date  . Anxiety   . Cervical disc disorder with myelopathy, unspecified cervical region   .  Chronic systolic (congestive) heart failure ([place])   . COPD (chronic obstructive pulmonary disease) ([place])   . Essential hypertension   . Hemorrhoids   . Liver mass   . Lung, cysts, congenital    Left lung cyst  . Myocardial infarction ([place])   . Nephrolithiasis    Embedded  . Nonischemic cardiomyopathy ([place])    LVEF 35-40% 2015  . On home O2   . Osteoarthritis   . Thoracic ascending aortic aneurysm ([facility])    4.3 cm April 2016   Past Surgical History:  Procedure Laterality Date  . Benign breast tumors    . CHOLECYSTECTOMY    . COLONOSCOPY    . COLONOSCOPY N/A 09/22/2014   Procedure: COLONOSCOPY;  Surgeon: Rogene Houston, MD;  [facility];  Service: Endoscopy;  Laterality: N/A;  830 -- to be done in OR under fluoro  . Complete hysterectomy    . CORONARY STENT INTERVENTION N/A 11/23/2016   Procedure: Coronary Stent Intervention;  Surgeon: Martinique, Peter M, MD;  Location: [place] CV LAB;  Service: Cardiovascular;  Laterality: N/A;  . LEFT HEART CATH AND CORONARY ANGIOGRAPHY N/A 11/23/2016   Procedure: Left Heart Cath and Coronary Angiography;  Surgeon: Martinique, Peter M, MD;  Location: [place] [facility];  Service: Cardiovascular;  Laterality: N/A;  . TONSILLECTOMY AND ADENOIDECTOMY       Current Meds  Medication Sig  . albuterol (PROVENTIL) (2.5 MG/3ML) 0.083% nebulizer solution Take 3 mLs (2.5 mg total) by nebulization every 6 (six) hours as needed for wheezing or shortness  of breath.  Marland Kitchen amoxicillin (AMOXIL) 500 MG capsule   . aspirin EC 81 MG tablet Take 81 mg by mouth every morning.   Marland Kitchen atorvastatin (LIPITOR) 80 MG tablet Take 1 tablet (80 mg total) by mouth daily at 6 PM.  . azithromycin (ZITHROMAX Z-PAK) 250 MG tablet Take by package instructions  . carvedilol (COREG) 6.25 MG tablet TAKE ONE TABLET BY MOUTH 2 TIMES A DAY.  . clorazepate (TRANXENE) 7.5 MG tablet Take 7.5 mg by mouth daily as needed for anxiety. For nerves  . ENTRESTO 24-26 MG TAKE 1 TABLET BY MOUTH  TWICE DAILY.  . meclizine (ANTIVERT) 25 MG tablet Take 1 or 2  Every 6 hours (Patient taking differently: Take 12.5-25 mg by mouth 4 (four) times daily as needed for dizziness. )  . nitroGLYCERIN (NITROSTAT) 0.4 MG SL tablet Place 1 tablet (0.4 mg total) under the tongue every 5 (five) minutes x 3 doses as needed for chest pain.  . OXYGEN Inhale 2 L into the lungs every evening.  . predniSONE (STERAPRED UNI-PAK 21 TAB) 10 MG (21) TBPK tablet Take by package instructions  . PROAIR HFA 108 (90 BASE) MCG/ACT inhaler Inhale 2 puffs into the lungs every 6 (six) hours as needed for wheezing or shortness of breath.   . SYMBICORT 160-4.5 MCG/ACT inhaler Inhale 2 puffs into the lungs 2 (two) times daily.  . traMADol (ULTRAM) 50 MG tablet Take 50 mg by mouth daily as needed for moderate pain or severe pain. Maximum dose= 8 tablets per day     Allergies:   Plavix [clopidogrel bisulfate]; Montelukast sodium; Alprazolam; Codeine; Percodan [oxycodone-aspirin]; and Valium   Social History   Tobacco Use  . Smoking status: Former Smoker    Packs/day: 0.25    Years: 31.00    Pack years: 7.75    Types: Cigarettes    Start date: 05/14/1977    Last attempt to quit: 05/28/2008    Years since quitting: 10.2  . Smokeless tobacco: Never Used  Substance Use Topics  . Alcohol use: No    Alcohol/week: 0.0 standard drinks  . Drug use: No     Family Hx: The patient's family history includes Aneurysm in her father; Diabetes in her brother; Heart disease in her brother, mother, and sister; Lung cancer in her brother.  ROS:   Please see the history of present illness.     All other systems reviewed and are negative.   Prior CV studies:   The following studies were reviewed today:   Cath 11/23/2016:   LV end diastolic pressure is normal.  Mid Cx lesion, 30 %stenosed.  Prox RCA lesion, 20 %stenosed.  Prox LAD to Mid LAD lesion, 85 %stenosed.  A STENT PROMUS PREM MR 3.5X32 drug eluting stent was  successfully placed.  Post intervention, there is a 0% residual stenosis.  1st Mrg lesion, 90 %stenosed.  A STENT PROMUS PREM MR 2.5X16 drug eluting stent was successfully placed.  Post intervention, there is a 0% residual stenosis.   1. 2 vessel obstructive CAD    - 85% segmental mid LAD    - 90% mid OM1.  2. Normal LVEDP 3. Successful stenting of the mid LAD with DES 4. Successful stenting of the mid OM1 with DES  Echocardiogram 11/24/16: Mildly reduced left ventricular systolic function, LVEF 25-05%, diffuse hypokinesis, grade 1 diastolic dysfunction, moderate aortic regurgitation.  Labs/Other Tests and Data Reviewed:    EKG:  07/25/18--NSR with LBBB (personally reviewed)  Recent Labs: 11/11/2017: Magnesium  2.0 02/03/2018: B Natriuretic Peptide 326.0 04/24/2018: ALT 11; BUN 12; Creatinine, Ser 0.66; Hemoglobin 12.2; Platelets 263; Potassium 4.2; Sodium 142   Recent Lipid Panel Lab Results  Component Value Date/Time   CHOL 228 (H) 04/01/2018 09:39 AM   TRIG 74 04/01/2018 09:39 AM   HDL 83 04/01/2018 09:39 AM   CHOLHDL 2.7 04/01/2018 09:39 AM   LDLCALC 130 (H) 04/01/2018 09:39 AM    Wt Readings from Last 3 Encounters:  09/12/18 170 lb (77.1 kg)  07/25/18 174 lb (78.9 kg)  04/24/18 176 lb (79.8 kg)     Objective:    Vital Signs:  BP (!) 142/70   Pulse 90   Temp 97.6 F (36.4 C)   Ht 5\' 2"  (1.575 m)   Wt 170 lb (77.1 kg)   SpO2 92%   BMI 31.09 kg/m    VITAL SIGNS:  reviewed  ASSESSMENT & PLAN:    1. Coronary artery disease: She underwent stenting of the LAD and OM1 in July 2018. Symptomatically stable.  Continue aspirin. Of note, Brilinta presumably led to increased shortness of breath from baseline in the past. She was switched to Plavix but this led to severe itching.She tolerated Effient without difficulty.  Continue carvedilol and Lipitor. She is on an angiotensin receptor blocker given her history of non-STEMI and cardiomyopathy.  2. Chronic  hypertension: Blood pressure ismildly elevated. No changes to therapy.  3. Thoracic aortic aneurysm: Stable at 4.2 cm by CT angiography of the chest on 11/11/2017.  4. Chronic combined heart failure: Euvolemic and stable. Continue carvedilol and Entresto.No diuretic requirement at this time.  5.  Hyperlipidemia: LDL 130 on 04/01/2018.  I will check lipids. Taking atorvastatin 80 mg. Will likely need PCSK9 inhibitors.   COVID-19 Education: The signs and symptoms of COVID-19 were discussed with the patient and how to seek care for testing (follow up with PCP or arrange E-visit).  The importance of social distancing was discussed today.  Time:   Today, I have spent 25 minutes with the patient with telehealth technology discussing the above problems.     Medication Adjustments/Labs and Tests Ordered: Current medicines are reviewed at length with the patient today.  Concerns regarding medicines are outlined above.   Tests Ordered: No orders of the defined types were placed in this encounter.   Medication Changes: No orders of the defined types were placed in this encounter.   Disposition:  Follow up in 4 month(s)  Signed, Kate Sable, MD  09/12/2018 8:47 AM    [facility]

## 2018-09-12 NOTE — Progress Notes (Signed)
Medication Instructions:  Your physician recommends that you continue on your current medications as directed. Please refer to the Current Medication list given to you today.   Labwork: LIPIDS  Testing/Procedures: NONE  Follow-Up: Your physician recommends that you schedule a follow-up appointment in: 4 MONTHS   Any Other Special Instructions Will Be Listed Below (If Applicable).     If you need a refill on your cardiac medications before your next appointment, please call your pharmacy.

## 2018-09-12 NOTE — Addendum Note (Signed)
Addended by: Debbora Lacrosse R on: 09/12/2018 09:37 AM   Modules accepted: Orders

## 2018-10-09 DIAGNOSIS — I2510 Atherosclerotic heart disease of native coronary artery without angina pectoris: Secondary | ICD-10-CM | POA: Diagnosis not present

## 2018-10-09 DIAGNOSIS — J449 Chronic obstructive pulmonary disease, unspecified: Secondary | ICD-10-CM | POA: Diagnosis not present

## 2018-10-09 DIAGNOSIS — I5022 Chronic systolic (congestive) heart failure: Secondary | ICD-10-CM | POA: Diagnosis not present

## 2018-10-09 DIAGNOSIS — F419 Anxiety disorder, unspecified: Secondary | ICD-10-CM | POA: Diagnosis not present

## 2018-10-29 ENCOUNTER — Emergency Department (HOSPITAL_COMMUNITY): Payer: Medicare Other

## 2018-10-29 ENCOUNTER — Other Ambulatory Visit: Payer: Self-pay

## 2018-10-29 ENCOUNTER — Emergency Department (HOSPITAL_COMMUNITY)
Admission: EM | Admit: 2018-10-29 | Discharge: 2018-10-29 | Disposition: A | Discharge status: Home / Self Care | Payer: Medicare Other | Attending: Emergency Medicine | Admitting: Emergency Medicine

## 2018-10-29 DIAGNOSIS — J449 Chronic obstructive pulmonary disease, unspecified: Secondary | ICD-10-CM | POA: Diagnosis not present

## 2018-10-29 DIAGNOSIS — I110 Hypertensive heart disease with heart failure: Secondary | ICD-10-CM | POA: Insufficient documentation

## 2018-10-29 DIAGNOSIS — I5042 Chronic combined systolic (congestive) and diastolic (congestive) heart failure: Secondary | ICD-10-CM | POA: Insufficient documentation

## 2018-10-29 DIAGNOSIS — Z20828 Contact with and (suspected) exposure to other viral communicable diseases: Secondary | ICD-10-CM | POA: Insufficient documentation

## 2018-10-29 DIAGNOSIS — Z87891 Personal history of nicotine dependence: Secondary | ICD-10-CM | POA: Insufficient documentation

## 2018-10-29 DIAGNOSIS — R079 Chest pain, unspecified: Secondary | ICD-10-CM | POA: Diagnosis not present

## 2018-10-29 DIAGNOSIS — R059 Cough, unspecified: Secondary | ICD-10-CM

## 2018-10-29 DIAGNOSIS — R05 Cough: Secondary | ICD-10-CM

## 2018-10-29 DIAGNOSIS — I959 Hypotension, unspecified: Secondary | ICD-10-CM | POA: Diagnosis not present

## 2018-10-29 DIAGNOSIS — Z209 Contact with and (suspected) exposure to unspecified communicable disease: Secondary | ICD-10-CM | POA: Diagnosis not present

## 2018-10-29 DIAGNOSIS — R069 Unspecified abnormalities of breathing: Secondary | ICD-10-CM | POA: Diagnosis not present

## 2018-10-29 DIAGNOSIS — J40 Bronchitis, not specified as acute or chronic: Secondary | ICD-10-CM | POA: Diagnosis not present

## 2018-10-29 DIAGNOSIS — I447 Left bundle-branch block, unspecified: Secondary | ICD-10-CM | POA: Diagnosis not present

## 2018-10-29 DIAGNOSIS — Z79899 Other long term (current) drug therapy: Secondary | ICD-10-CM | POA: Diagnosis not present

## 2018-10-29 LAB — CBC WITH DIFFERENTIAL/PLATELET
Abs Immature Granulocytes: 0.02 10*3/uL (ref 0.00–0.07)
Basophils Absolute: 0.1 10*3/uL (ref 0.0–0.1)
Basophils Relative: 1 %
Eosinophils Absolute: 1.1 10*3/uL — ABNORMAL HIGH (ref 0.0–0.5)
Eosinophils Relative: 17 %
HCT: 37.8 % (ref 36.0–46.0)
Hemoglobin: 11.5 g/dL — ABNORMAL LOW (ref 12.0–15.0)
Immature Granulocytes: 0 %
Lymphocytes Relative: 33 %
Lymphs Abs: 2.1 10*3/uL (ref 0.7–4.0)
MCH: 26.4 pg (ref 26.0–34.0)
MCHC: 30.4 g/dL (ref 30.0–36.0)
MCV: 86.7 fL (ref 80.0–100.0)
Monocytes Absolute: 0.7 10*3/uL (ref 0.1–1.0)
Monocytes Relative: 11 %
Neutro Abs: 2.5 10*3/uL (ref 1.7–7.7)
Neutrophils Relative %: 38 %
Platelets: 248 10*3/uL (ref 150–400)
RBC: 4.36 MIL/uL (ref 3.87–5.11)
RDW: 13.6 % (ref 11.5–15.5)
WBC: 6.5 10*3/uL (ref 4.0–10.5)
nRBC: 0 % (ref 0.0–0.2)

## 2018-10-29 LAB — BASIC METABOLIC PANEL
Anion gap: 11 (ref 5–15)
BUN: 11 mg/dL (ref 8–23)
CO2: 28 mmol/L (ref 22–32)
Calcium: 9.5 mg/dL (ref 8.9–10.3)
Chloride: 103 mmol/L (ref 98–111)
Creatinine, Ser: 0.58 mg/dL (ref 0.44–1.00)
GFR calc Af Amer: >60 mL/min (ref >60)
GFR calc non Af Amer: >60 mL/min (ref >60)
Glucose, Bld: 113 mg/dL — ABNORMAL HIGH (ref 70–99)
Potassium: 4 mmol/L (ref 3.5–5.1)
Sodium: 142 mmol/L (ref 135–145)

## 2018-10-29 LAB — SARS CORONAVIRUS 2 BY RT PCR (HOSPITAL ORDER, PERFORMED IN ~~LOC~~ HOSPITAL LAB): SARS Coronavirus 2: NEGATIVE

## 2018-10-29 MED ORDER — METHYLPREDNISOLONE SODIUM SUCC 125 MG IJ SOLR
125.0000 mg | Freq: Once | INTRAMUSCULAR | Status: AC
  Administered 2018-10-29: 125 mg via INTRAVENOUS
  Filled 2018-10-29: qty 2

## 2018-10-29 MED ORDER — ALBUTEROL SULFATE HFA 108 (90 BASE) MCG/ACT IN AERS
2.0000 | INHALATION_SPRAY | RESPIRATORY_TRACT | Status: DC | PRN
  Administered 2018-10-29: 2 via RESPIRATORY_TRACT
  Filled 2018-10-29: qty 6.7

## 2018-10-29 MED ORDER — PREDNISONE 10 MG PO TABS: 20.0000 mg | ORAL_TABLET | Freq: Every day | ORAL | 0 refills | Status: DC

## 2018-10-29 MED ORDER — MAGNESIUM SULFATE 2 GM/50ML IV SOLN
2.0000 g | Freq: Once | INTRAVENOUS | Status: AC
  Administered 2018-10-29: 2 g via INTRAVENOUS
  Filled 2018-10-29: qty 50

## 2018-10-29 MED ORDER — DOXYCYCLINE HYCLATE 100 MG PO CAPS: 100.0000 mg | ORAL_CAPSULE | Freq: Two times a day (BID) | ORAL | 0 refills | Status: DC

## 2018-10-29 NOTE — ED Provider Notes (Signed)
[facility] EMERGENCY DEPARTMENT Provider Note   CSN: [id number] Arrival date & time: 10/29/18  1919     History   Chief Complaint Chief Complaint  Patient presents with  . Cough    HPI [name] is a 83 y.o. female.     Patient complains of cough shortness of breath wheezing  The history is provided by the patient. No language interpreter was used.  Cough Cough characteristics:  Productive Sputum characteristics:  Nondescript Severity:  Moderate Onset quality:  Sudden Timing:  Constant Progression:  Worsening Associated symptoms: wheezing   Associated symptoms: no chest pain, no eye discharge, no headaches and no rash     Past Medical History:  Diagnosis Date  . Anxiety   . Cervical disc disorder with myelopathy, unspecified cervical region   . Chronic systolic (congestive) heart failure ([place])   . COPD (chronic obstructive pulmonary disease) ([place])   . Essential hypertension   . Hemorrhoids   . Liver mass   . Lung, cysts, congenital    Left lung cyst  . Myocardial infarction ([place])   . Nephrolithiasis    Embedded  . Nonischemic cardiomyopathy ([place])    LVEF 35-40% 2015  . On home O2   . Osteoarthritis   . Thoracic ascending aortic aneurysm (HCC)    4.3 cm April 2016    Patient Active Problem List   Diagnosis Date Noted  . Acute on chronic respiratory failure with hypoxia ([place]) 04/24/2018  . Chronic stable angina ([place]) 04/24/2018  . Physical deconditioning 04/24/2018  . CAD (coronary artery disease) 02/03/2018  . Ischemic cardiomyopathy 11/25/2017  . Dizziness 04/01/2017  . COPD (chronic obstructive pulmonary disease) ([place]) 04/01/2017  . CAD S/P percutaneous coronary angioplasty 04/01/2017  . NSTEMI (non-ST elevated myocardial infarction) ([place]) 11/23/2016  . Hypertensive cardiovascular disease 10/03/2015  . Protein-calorie malnutrition, severe 07/13/2015  . Demand ischemia of myocardium ([place]) 07/13/2015  . Nonischemic cardiomyopathy ([place])   .  Atypical chest pain   . Left bundle branch block   . COPD exacerbation ([place]) 07/11/2015  . Flu-like symptoms 07/11/2015  . Elevated troponin 07/11/2015  . Acute bronchitis 07/11/2015  . GERD (gastroesophageal reflux disease) 07/11/2015  . Bronchospasm   . Ascending aortic aneurysm ([place]) 01/27/2015  . Chronic combined systolic and diastolic CHF (congestive heart failure) ([place]) 01/27/2015  . Chest pain at rest 01/27/2015  . Essential hypertension 01/27/2015  . Anxiety 01/27/2015  . Chronic pain 01/27/2015  . Chest pain 08/20/2013  . Hypertension 08/20/2013  . Contusion of left knee 08/07/2012  . Sprain of wrist 08/07/2012  . Liver mass 05/21/2011  . Abdominal pain 05/21/2011  . Bronchitis 05/21/2011  . De Quervain's disease (tenosynovitis) 04/02/2011  . SHOULDER PAIN 01/19/2009  . CERVICALGIA 01/19/2009  . IMPINGEMENT SYNDROME 01/19/2009    Past Surgical History:  Procedure Laterality Date  . Benign breast tumors    . CHOLECYSTECTOMY    . COLONOSCOPY    . COLONOSCOPY N/A 09/22/2014   Procedure: COLONOSCOPY;  Surgeon: Rogene Houston, MD;  [facility];  Service: Endoscopy;  Laterality: N/A;  830 -- to be done in OR under fluoro  . Complete hysterectomy    . CORONARY STENT INTERVENTION N/A 11/23/2016   Procedure: Coronary Stent Intervention;  Surgeon: Martinique, Peter M, MD;  Location: [facility];  Service: Cardiovascular;  Laterality: N/A;  . LEFT HEART CATH AND CORONARY ANGIOGRAPHY N/A 11/23/2016   Procedure: Left Heart Cath and Coronary Angiography;  Surgeon: Martinique, Peter M,  MD;  Location: [place];  Service: Cardiovascular;  Laterality: N/A;  . TONSILLECTOMY AND ADENOIDECTOMY       OB History   No obstetric history on file.      Home Medications    Prior to Admission medications   Medication Sig Start Date End Date Taking? Authorizing Provider  albuterol (PROVENTIL) (2.5 MG/3ML) 0.083% nebulizer solution Take 3 mLs (2.5 mg total) by  nebulization every 6 (six) hours as needed for wheezing or shortness of breath. 07/13/15   Sinda Du, MD  amoxicillin (AMOXIL) 500 MG capsule  09/08/18   [provider]  aspirin EC 81 MG tablet Take 81 mg by mouth every morning.     [provider]  atorvastatin (LIPITOR) 80 MG tablet Take 1 tablet (80 mg total) by mouth daily at 6 PM. 11/24/16   Bhagat, [place], PA  azithromycin (ZITHROMAX Z-PAK) 250 MG tablet Take by package instructions 04/28/18   Sinda Du, MD  carvedilol (COREG) 6.25 MG tablet TAKE ONE TABLET BY MOUTH 2 TIMES A DAY. 01/20/18   Herminio Commons, MD  clorazepate (TRANXENE) 7.5 MG tablet Take 7.5 mg by mouth daily as needed for anxiety. For nerves    [provider]  doxycycline (VIBRAMYCIN) 100 MG capsule Take 1 capsule (100 mg total) by mouth 2 (two) times daily. One po bid x 7 days 10/29/18   Milton Ferguson, MD  ENTRESTO 24-26 MG TAKE 1 TABLET BY MOUTH TWICE DAILY. 09/12/18   Herminio Commons, MD  meclizine (ANTIVERT) 25 MG tablet Take 1 or 2  Every 6 hours Patient taking differently: Take 12.5-25 mg by mouth 4 (four) times daily as needed for dizziness.  04/03/17   Sinda Du, MD  nitroGLYCERIN (NITROSTAT) 0.4 MG SL tablet Place 1 tablet (0.4 mg total) under the tongue every 5 (five) minutes x 3 doses as needed for chest pain. 02/05/18   Sinda Du, MD  OXYGEN Inhale 2 L into the lungs every evening.    [provider]  predniSONE (DELTASONE) 10 MG tablet Take 2 tablets (20 mg total) by mouth daily. 10/29/18   Milton Ferguson, MD  PROAIR HFA 108 (90 BASE) MCG/ACT inhaler Inhale 2 puffs into the lungs every 6 (six) hours as needed for wheezing or shortness of breath.  02/08/14   [provider]  SYMBICORT 160-4.5 MCG/ACT inhaler Inhale 2 puffs into the lungs 2 (two) times daily. 09/23/17   [provider]  traMADol (ULTRAM) 50 MG tablet Take 50 mg by mouth daily as needed for moderate pain or severe pain.  Maximum dose= 8 tablets per day    [provider]    Family History Family History  Problem Relation Age of Onset  . Heart disease Mother   . Aneurysm Father   . Lung cancer Brother   . Heart disease Sister   . Diabetes Brother   . Heart disease Brother     Social History Social History   Tobacco Use  . Smoking status: Former Smoker    Packs/day: 0.25    Years: 31.00    Pack years: 7.75    Types: Cigarettes    Start date: 05/14/1977    Quit date: 05/28/2008    Years since quitting: 10.4  . Smokeless tobacco: Never Used  Substance Use Topics  . Alcohol use: No    Alcohol/week: 0.0 standard drinks  . Drug use: No     Allergies   Plavix [clopidogrel bisulfate], Montelukast sodium, Alprazolam,  Codeine, Percodan [oxycodone-aspirin], and Valium   Review of Systems Review of Systems  Constitutional: Negative for appetite change and fatigue.  HENT: Negative for congestion, ear discharge and sinus pressure.   Eyes: Negative for discharge.  Respiratory: Positive for cough and wheezing.   Cardiovascular: Negative for chest pain.  Gastrointestinal: Negative for abdominal pain and diarrhea.  Genitourinary: Negative for frequency and hematuria.  Musculoskeletal: Negative for back pain.  Skin: Negative for rash.  Neurological: Negative for seizures and headaches.  Psychiatric/Behavioral: Negative for hallucinations.     Physical Exam Updated Vital Signs BP (!) 139/46 (BP Location: Right Arm)   Pulse 66   Temp 98.2 F (36.8 C) (Oral)   Resp 20   SpO2 97%   Physical Exam Nursing note reviewed.  Constitutional:      Appearance: She is well-developed.  HENT:     Head: Normocephalic.     Nose: Nose normal.  Eyes:     General: No scleral icterus.    Conjunctiva/sclera: Conjunctivae normal.  Neck:     Musculoskeletal: Neck supple.     Thyroid: No thyromegaly.  Cardiovascular:     Rate and Rhythm: Normal rate and regular rhythm.     Heart sounds: No  murmur. No friction rub. No gallop.   Pulmonary:     Breath sounds: No stridor. Wheezing present. No rales.  Chest:     Chest wall: No tenderness.  Abdominal:     General: There is no distension.     Tenderness: There is no abdominal tenderness. There is no rebound.  Musculoskeletal: Normal range of motion.  Lymphadenopathy:     Cervical: No cervical adenopathy.  Skin:    Findings: No erythema or rash.  Neurological:     Mental Status: She is oriented to person, place, and time.     Motor: No abnormal muscle tone.     Coordination: Coordination normal.  Psychiatric:        Behavior: Behavior normal.      ED Treatments / Results  Labs (all labs ordered are listed, but only abnormal results are displayed) Labs Reviewed  CBC WITH DIFFERENTIAL/PLATELET - Abnormal; Notable for the following components:      Result Value   Hemoglobin 11.5 (*)    Eosinophils Absolute 1.1 (*)    All other components within normal limits  BASIC METABOLIC PANEL - Abnormal; Notable for the following components:   Glucose, Bld 113 (*)    All other components within normal limits  SARS CORONAVIRUS 2 (HOSPITAL ORDER, [facility])    EKG    Radiology Dg Chest Port 1 View  Result Date: 10/29/2018 CLINICAL DATA:  Cough and congestion for 3 days. EXAM: PORTABLE CHEST 1 VIEW COMPARISON:  07/25/2018 FINDINGS: Unchanged mild cardiomegaly. Unchanged mediastinal contours. No pulmonary edema. Mild chronic hyperinflation. No focal airspace disease, pleural effusion, or pneumothorax. Left shoulder arthroplasty. Surgical hardware in the lower cervical spine. IMPRESSION: Stable mild cardiomegaly and hyperinflation without acute abnormality. Electronically Signed   By: Keith Rake M.D.   On: 10/29/2018 20:27    Procedures Procedures (including critical care time)  Medications Ordered in ED Medications  albuterol (VENTOLIN HFA) 108 (90 Base) MCG/ACT inhaler 2 puff (has no  administration in time range)  magnesium sulfate IVPB 2 g 50 mL (2 g Intravenous New Bag/Given 10/29/18 2214)  methylPREDNISolone sodium succinate (SOLU-MEDROL) 125 mg/2 mL injection 125 mg (125 mg Intravenous Given 10/29/18 2213)     Initial Impression / Assessment  and Plan / ED Course  I have reviewed the triage vital signs and the nursing notes.  Pertinent labs & imaging results that were available during my care of the patient were reviewed by me and considered in my medical decision making (see chart for details).        Patient with mild exacerbation COPD.  She improved with treatment emergency department.  We will send her home on prednisone doxycycline and she will follow-up with her PCP  Final Clinical Impressions(s) / ED Diagnoses   Final diagnoses:  Bronchitis    ED Discharge Orders         Ordered    predniSONE (DELTASONE) 10 MG tablet  Daily     10/29/18 2250    doxycycline (VIBRAMYCIN) 100 MG capsule  2 times daily     10/29/18 2250           Milton Ferguson, MD 10/29/18 2251

## 2018-10-29 NOTE — ED Notes (Signed)
Called daughter for ride

## 2018-10-29 NOTE — Discharge Instructions (Addendum)
Use your inhaler 4 times a day and follow-up with your doctor in a couple days for recheck

## 2018-10-29 NOTE — ED Triage Notes (Addendum)
Cough and congestion x 3 days with thick white sputum. Audible congestion noted. A/o. No resp distres noted but mild labored breathing noted.  Iv by EMS. Chronic 02 2.5L and cont in ED. C/o mid pain to chest that is sore and worse with coughing.

## 2018-11-24 DIAGNOSIS — J449 Chronic obstructive pulmonary disease, unspecified: Secondary | ICD-10-CM | POA: Diagnosis not present

## 2018-11-24 DIAGNOSIS — I10 Essential (primary) hypertension: Secondary | ICD-10-CM | POA: Diagnosis not present

## 2018-11-24 DIAGNOSIS — I5022 Chronic systolic (congestive) heart failure: Secondary | ICD-10-CM | POA: Diagnosis not present

## 2018-11-24 DIAGNOSIS — I2510 Atherosclerotic heart disease of native coronary artery without angina pectoris: Secondary | ICD-10-CM | POA: Diagnosis not present

## 2018-12-16 ENCOUNTER — Encounter (HOSPITAL_COMMUNITY): Payer: Self-pay

## 2018-12-16 ENCOUNTER — Inpatient Hospital Stay (HOSPITAL_COMMUNITY)
Admission: EM | Admit: 2018-12-16 | Discharge: 2018-12-19 | DRG: 190 | Disposition: A | Discharge status: Home Health | Payer: Medicare Other | Attending: Pulmonary Disease | Admitting: Pulmonary Disease

## 2018-12-16 ENCOUNTER — Other Ambulatory Visit: Payer: Self-pay

## 2018-12-16 ENCOUNTER — Emergency Department (HOSPITAL_COMMUNITY): Payer: Medicare Other

## 2018-12-16 DIAGNOSIS — I428 Other cardiomyopathies: Secondary | ICD-10-CM | POA: Diagnosis present

## 2018-12-16 DIAGNOSIS — M5000 Cervical disc disorder with myelopathy, unspecified cervical region: Secondary | ICD-10-CM | POA: Diagnosis present

## 2018-12-16 DIAGNOSIS — Z801 Family history of malignant neoplasm of trachea, bronchus and lung: Secondary | ICD-10-CM | POA: Diagnosis not present

## 2018-12-16 DIAGNOSIS — R0602 Shortness of breath: Secondary | ICD-10-CM

## 2018-12-16 DIAGNOSIS — Z20828 Contact with and (suspected) exposure to other viral communicable diseases: Secondary | ICD-10-CM | POA: Diagnosis present

## 2018-12-16 DIAGNOSIS — Z209 Contact with and (suspected) exposure to unspecified communicable disease: Secondary | ICD-10-CM | POA: Diagnosis not present

## 2018-12-16 DIAGNOSIS — K219 Gastro-esophageal reflux disease without esophagitis: Secondary | ICD-10-CM | POA: Diagnosis present

## 2018-12-16 DIAGNOSIS — I255 Ischemic cardiomyopathy: Secondary | ICD-10-CM | POA: Diagnosis present

## 2018-12-16 DIAGNOSIS — J449 Chronic obstructive pulmonary disease, unspecified: Secondary | ICD-10-CM | POA: Diagnosis present

## 2018-12-16 DIAGNOSIS — I5022 Chronic systolic (congestive) heart failure: Secondary | ICD-10-CM | POA: Diagnosis not present

## 2018-12-16 DIAGNOSIS — J9621 Acute and chronic respiratory failure with hypoxia: Secondary | ICD-10-CM | POA: Diagnosis present

## 2018-12-16 DIAGNOSIS — I361 Nonrheumatic tricuspid (valve) insufficiency: Secondary | ICD-10-CM | POA: Diagnosis not present

## 2018-12-16 DIAGNOSIS — Z7952 Long term (current) use of systemic steroids: Secondary | ICD-10-CM | POA: Diagnosis not present

## 2018-12-16 DIAGNOSIS — F419 Anxiety disorder, unspecified: Secondary | ICD-10-CM | POA: Diagnosis present

## 2018-12-16 DIAGNOSIS — R062 Wheezing: Secondary | ICD-10-CM | POA: Diagnosis not present

## 2018-12-16 DIAGNOSIS — Z8249 Family history of ischemic heart disease and other diseases of the circulatory system: Secondary | ICD-10-CM | POA: Diagnosis not present

## 2018-12-16 DIAGNOSIS — Z888 Allergy status to other drugs, medicaments and biological substances status: Secondary | ICD-10-CM | POA: Diagnosis not present

## 2018-12-16 DIAGNOSIS — Z87891 Personal history of nicotine dependence: Secondary | ICD-10-CM | POA: Diagnosis not present

## 2018-12-16 DIAGNOSIS — I252 Old myocardial infarction: Secondary | ICD-10-CM

## 2018-12-16 DIAGNOSIS — Z7982 Long term (current) use of aspirin: Secondary | ICD-10-CM

## 2018-12-16 DIAGNOSIS — J432 Centrilobular emphysema: Secondary | ICD-10-CM | POA: Diagnosis not present

## 2018-12-16 DIAGNOSIS — R0902 Hypoxemia: Secondary | ICD-10-CM | POA: Diagnosis not present

## 2018-12-16 DIAGNOSIS — Z7951 Long term (current) use of inhaled steroids: Secondary | ICD-10-CM

## 2018-12-16 DIAGNOSIS — J90 Pleural effusion, not elsewhere classified: Secondary | ICD-10-CM | POA: Diagnosis not present

## 2018-12-16 DIAGNOSIS — J441 Chronic obstructive pulmonary disease with (acute) exacerbation: Secondary | ICD-10-CM | POA: Diagnosis not present

## 2018-12-16 DIAGNOSIS — Z885 Allergy status to narcotic agent status: Secondary | ICD-10-CM | POA: Diagnosis not present

## 2018-12-16 DIAGNOSIS — I110 Hypertensive heart disease with heart failure: Secondary | ICD-10-CM | POA: Diagnosis present

## 2018-12-16 DIAGNOSIS — I2510 Atherosclerotic heart disease of native coronary artery without angina pectoris: Secondary | ICD-10-CM | POA: Diagnosis present

## 2018-12-16 DIAGNOSIS — G8929 Other chronic pain: Secondary | ICD-10-CM | POA: Diagnosis present

## 2018-12-16 DIAGNOSIS — Z955 Presence of coronary angioplasty implant and graft: Secondary | ICD-10-CM

## 2018-12-16 DIAGNOSIS — I447 Left bundle-branch block, unspecified: Secondary | ICD-10-CM | POA: Diagnosis not present

## 2018-12-16 DIAGNOSIS — I340 Nonrheumatic mitral (valve) insufficiency: Secondary | ICD-10-CM | POA: Diagnosis not present

## 2018-12-16 DIAGNOSIS — I712 Thoracic aortic aneurysm, without rupture: Secondary | ICD-10-CM | POA: Diagnosis present

## 2018-12-16 DIAGNOSIS — Z833 Family history of diabetes mellitus: Secondary | ICD-10-CM | POA: Diagnosis not present

## 2018-12-16 DIAGNOSIS — R069 Unspecified abnormalities of breathing: Secondary | ICD-10-CM | POA: Diagnosis not present

## 2018-12-16 DIAGNOSIS — I5042 Chronic combined systolic (congestive) and diastolic (congestive) heart failure: Secondary | ICD-10-CM | POA: Diagnosis not present

## 2018-12-16 DIAGNOSIS — I10 Essential (primary) hypertension: Secondary | ICD-10-CM | POA: Diagnosis present

## 2018-12-16 LAB — CBC WITH DIFFERENTIAL/PLATELET
Abs Immature Granulocytes: 0.02 10*3/uL (ref 0.00–0.07)
Basophils Absolute: 0.1 10*3/uL (ref 0.0–0.1)
Basophils Relative: 1 %
Eosinophils Absolute: 1 10*3/uL — ABNORMAL HIGH (ref 0.0–0.5)
Eosinophils Relative: 12 %
HCT: 40.3 % (ref 36.0–46.0)
Hemoglobin: 12.5 g/dL (ref 12.0–15.0)
Immature Granulocytes: 0 %
Lymphocytes Relative: 28 %
Lymphs Abs: 2.3 10*3/uL (ref 0.7–4.0)
MCH: 26.3 pg (ref 26.0–34.0)
MCHC: 31 g/dL (ref 30.0–36.0)
MCV: 84.8 fL (ref 80.0–100.0)
Monocytes Absolute: 0.8 10*3/uL (ref 0.1–1.0)
Monocytes Relative: 10 %
Neutro Abs: 4 10*3/uL (ref 1.7–7.7)
Neutrophils Relative %: 49 %
Platelets: 248 10*3/uL (ref 150–400)
RBC: 4.75 MIL/uL (ref 3.87–5.11)
RDW: 13.7 % (ref 11.5–15.5)
WBC: 8.3 10*3/uL (ref 4.0–10.5)
nRBC: 0 % (ref 0.0–0.2)

## 2018-12-16 LAB — BASIC METABOLIC PANEL
Anion gap: 12 (ref 5–15)
BUN: 12 mg/dL (ref 8–23)
CO2: 27 mmol/L (ref 22–32)
Calcium: 9.7 mg/dL (ref 8.9–10.3)
Chloride: 103 mmol/L (ref 98–111)
Creatinine, Ser: 0.56 mg/dL (ref 0.44–1.00)
GFR calc Af Amer: >60 mL/min (ref >60)
GFR calc non Af Amer: >60 mL/min (ref >60)
Glucose, Bld: 116 mg/dL — ABNORMAL HIGH (ref 70–99)
Potassium: 3.5 mmol/L (ref 3.5–5.1)
Sodium: 142 mmol/L (ref 135–145)

## 2018-12-16 LAB — TROPONIN I (HIGH SENSITIVITY)
Troponin I (High Sensitivity): 11 ng/L (ref <18)
Troponin I (High Sensitivity): 8 ng/L (ref <18)

## 2018-12-16 LAB — SARS CORONAVIRUS 2 BY RT PCR (HOSPITAL ORDER, PERFORMED IN ~~LOC~~ HOSPITAL LAB): SARS Coronavirus 2: NEGATIVE

## 2018-12-16 LAB — BRAIN NATRIURETIC PEPTIDE: B Natriuretic Peptide: 203 pg/mL — ABNORMAL HIGH (ref 0.0–100.0)

## 2018-12-16 MED ORDER — IPRATROPIUM-ALBUTEROL 0.5-2.5 (3) MG/3ML IN SOLN
3.0000 mL | Freq: Three times a day (TID) | RESPIRATORY_TRACT | Status: DC
  Administered 2018-12-17 – 2018-12-19 (×7): 3 mL via RESPIRATORY_TRACT
  Filled 2018-12-16 (×7): qty 3

## 2018-12-16 MED ORDER — GUAIFENESIN ER 600 MG PO TB12
600.0000 mg | ORAL_TABLET | Freq: Two times a day (BID) | ORAL | Status: DC
  Administered 2018-12-16 – 2018-12-19 (×6): 600 mg via ORAL
  Filled 2018-12-16 (×6): qty 1

## 2018-12-16 MED ORDER — NITROGLYCERIN 0.4 MG SL SUBL: 0.4000 mg | SUBLINGUAL_TABLET | SUBLINGUAL | Status: DC | PRN

## 2018-12-16 MED ORDER — IPRATROPIUM BROMIDE 0.02 % IN SOLN
1.0000 mg | Freq: Once | RESPIRATORY_TRACT | Status: AC
  Administered 2018-12-16: 1 mg via RESPIRATORY_TRACT
  Filled 2018-12-16: qty 5

## 2018-12-16 MED ORDER — ALBUTEROL SULFATE (2.5 MG/3ML) 0.083% IN NEBU: 2.5000 mg | INHALATION_SOLUTION | Freq: Four times a day (QID) | RESPIRATORY_TRACT | Status: DC

## 2018-12-16 MED ORDER — IPRATROPIUM-ALBUTEROL 0.5-2.5 (3) MG/3ML IN SOLN
3.0000 mL | Freq: Four times a day (QID) | RESPIRATORY_TRACT | Status: DC
  Administered 2018-12-16: 3 mL via RESPIRATORY_TRACT
  Filled 2018-12-16: qty 3

## 2018-12-16 MED ORDER — ALBUTEROL (5 MG/ML) CONTINUOUS INHALATION SOLN
10.0000 mg/h | INHALATION_SOLUTION | Freq: Once | RESPIRATORY_TRACT | Status: AC
  Administered 2018-12-16: 10 mg/h via RESPIRATORY_TRACT
  Filled 2018-12-16: qty 20

## 2018-12-16 MED ORDER — HEPARIN SODIUM (PORCINE) 5000 UNIT/ML IJ SOLN
5000.0000 [IU] | Freq: Three times a day (TID) | INTRAMUSCULAR | Status: DC
  Administered 2018-12-16 – 2018-12-19 (×8): 5000 [IU] via SUBCUTANEOUS
  Filled 2018-12-16 (×8): qty 1

## 2018-12-16 MED ORDER — ALBUTEROL SULFATE (2.5 MG/3ML) 0.083% IN NEBU: 2.5000 mg | INHALATION_SOLUTION | Freq: Four times a day (QID) | RESPIRATORY_TRACT | Status: DC | PRN

## 2018-12-16 MED ORDER — ACETAMINOPHEN 325 MG PO TABS
650.0000 mg | ORAL_TABLET | Freq: Four times a day (QID) | ORAL | Status: DC | PRN
  Filled 2018-12-16: qty 2

## 2018-12-16 MED ORDER — TRAMADOL HCL 50 MG PO TABS: 50.0000 mg | ORAL_TABLET | Freq: Every day | ORAL | Status: DC | PRN

## 2018-12-16 MED ORDER — MECLIZINE HCL 12.5 MG PO TABS: 12.5000 mg | ORAL_TABLET | Freq: Four times a day (QID) | ORAL | Status: DC | PRN

## 2018-12-16 MED ORDER — ASPIRIN EC 81 MG PO TBEC
81.0000 mg | DELAYED_RELEASE_TABLET | Freq: Every day | ORAL | Status: DC
  Administered 2018-12-16 – 2018-12-19 (×4): 81 mg via ORAL
  Filled 2018-12-16 (×4): qty 1

## 2018-12-16 MED ORDER — ALBUTEROL SULFATE (2.5 MG/3ML) 0.083% IN NEBU: 2.5000 mg | INHALATION_SOLUTION | RESPIRATORY_TRACT | Status: DC | PRN

## 2018-12-16 MED ORDER — ACETAMINOPHEN 650 MG RE SUPP: 650.0000 mg | Freq: Four times a day (QID) | RECTAL | Status: DC | PRN

## 2018-12-16 MED ORDER — METHYLPREDNISOLONE SODIUM SUCC 40 MG IJ SOLR
40.0000 mg | Freq: Two times a day (BID) | INTRAMUSCULAR | Status: DC
  Administered 2018-12-17 – 2018-12-19 (×5): 40 mg via INTRAVENOUS
  Filled 2018-12-16 (×5): qty 1

## 2018-12-16 MED ORDER — ALBUTEROL SULFATE HFA 108 (90 BASE) MCG/ACT IN AERS
2.0000 | INHALATION_SPRAY | Freq: Four times a day (QID) | RESPIRATORY_TRACT | Status: DC | PRN
  Filled 2018-12-16: qty 6.7

## 2018-12-16 MED ORDER — ATORVASTATIN CALCIUM 40 MG PO TABS
80.0000 mg | ORAL_TABLET | Freq: Every day | ORAL | Status: DC
  Administered 2018-12-17 – 2018-12-18 (×2): 80 mg via ORAL
  Filled 2018-12-16 (×2): qty 2

## 2018-12-16 MED ORDER — IPRATROPIUM BROMIDE 0.02 % IN SOLN: 0.5000 mg | Freq: Four times a day (QID) | RESPIRATORY_TRACT | Status: DC

## 2018-12-16 MED ORDER — CARVEDILOL 3.125 MG PO TABS
6.2500 mg | ORAL_TABLET | Freq: Two times a day (BID) | ORAL | Status: DC
  Administered 2018-12-16 – 2018-12-19 (×6): 6.25 mg via ORAL
  Filled 2018-12-16 (×6): qty 2

## 2018-12-16 MED ORDER — MOMETASONE FURO-FORMOTEROL FUM 200-5 MCG/ACT IN AERO
2.0000 | INHALATION_SPRAY | Freq: Two times a day (BID) | RESPIRATORY_TRACT | Status: DC
  Administered 2018-12-17 – 2018-12-19 (×4): 2 via RESPIRATORY_TRACT
  Filled 2018-12-16: qty 8.8

## 2018-12-16 NOTE — H&P (Signed)
TRH H&P   Patient Demographics:    Aalia Greulich, is a 83 y.o. female  MRN: [id number]   DOB - January 01, 1936  Admit Date - 12/16/2018  Outpatient Primary MD for the patient is Sinda Du, MD  Referring MD/NP/PA: Dr Thurnell Garbe  Outpatient Specialists: Dr Luan Pulling   Chief Complaint  Patient presents with  . Shortness of Breath      HPI:    Virjean Boman  is a 83 y.o. female : with medical history significant of  COPD, anxiety, congestive heart failure, hypertension, CAD presenting to the hospital for evaluation of shortness of breath, wheezing, and cough, reports symptom has been going on for last 2 days, after she was dusting without wearing a mask, patient reports some baseline dyspnea, but has worsened over the last 2 days, usually she uses oxygen only at sleep, and on exertion, but she has been using it continuous for last 48 hours, without much relief, per EMS patient was with significant wheezing, she received nebulizer treatment, and IV Solu-Medrol, she denies chest pain, palpitation, fever, chills, productive sputum, hemoptysis, nausea or vomiting . -IN ED patient was not hypoxic, but had significantly increased work of breathing, and significant wheezing initially which has improved after receiving continuous neb, given her increased work of breathing I was called to admit .    Review of systems:    In addition to the HPI above,  No Fever-chills, No Headache, No changes with Vision or hearing, No problems swallowing food or Liquids, No Chest pain, reports cough, shortness of breath and wheezing No Abdominal pain, No Nausea or Vommitting, Bowel movements are regular, No Blood in stool or Urine, No dysuria, No new skin rashes or bruises, No new joints pains-aches,  No new weakness, tingling, numbness in any extremity, No recent weight gain or loss, No polyuria, polydypsia or  polyphagia, No significant Mental Stressors.  A full 10 point Review of Systems was done, except as stated above, all other Review of Systems were negative.   With Past History of the following :    Past Medical History:  Diagnosis Date  . Anxiety   . Cervical disc disorder with myelopathy, unspecified cervical region   . Chronic systolic (congestive) heart failure ([place])   . COPD (chronic obstructive pulmonary disease) ([place])   . Essential hypertension   . Hemorrhoids   . Liver mass   . Lung, cysts, congenital    Left lung cyst  . Myocardial infarction ([place])   . Nephrolithiasis    Embedded  . Nonischemic cardiomyopathy ([place])    LVEF 35-40% 2015  . On home O2   . Osteoarthritis   . Thoracic ascending aortic aneurysm ([facility])    4.3 cm April 2016      Past Surgical History:  Procedure Laterality Date  . Benign breast tumors    . CHOLECYSTECTOMY    . COLONOSCOPY    .  COLONOSCOPY N/A 09/22/2014   Procedure: COLONOSCOPY;  Surgeon: Rogene Houston, MD;  [facility];  Service: Endoscopy;  Laterality: N/A;  830 -- to be done in OR under fluoro  . Complete hysterectomy    . CORONARY STENT INTERVENTION N/A 11/23/2016   Procedure: Coronary Stent Intervention;  Surgeon: Martinique, Peter M, MD;  Location: [facility];  Service: Cardiovascular;  Laterality: N/A;  . LEFT HEART CATH AND CORONARY ANGIOGRAPHY N/A 11/23/2016   Procedure: Left Heart Cath and Coronary Angiography;  Surgeon: Martinique, Peter M, MD;  Location: [facility];  Service: Cardiovascular;  Laterality: N/A;  . TONSILLECTOMY AND ADENOIDECTOMY        Social History:     Social History   Tobacco Use  . Smoking status: Former Smoker    Packs/day: 0.25    Years: 31.00    Pack years: 7.75    Types: Cigarettes    Start date: 05/14/1977    Quit date: 05/28/2008    Years since quitting: 10.5  . Smokeless tobacco: Never Used  Substance Use Topics  . Alcohol use: No    Alcohol/week: 0.0 standard  drinks        Family History :     Family History  Problem Relation Age of Onset  . Heart disease Mother   . Aneurysm Father   . Lung cancer Brother   . Heart disease Sister   . Diabetes Brother   . Heart disease Brother       Home Medications:   Prior to Admission medications   Medication Sig Start Date End Date Taking? Authorizing Provider  albuterol (PROVENTIL) (2.5 MG/3ML) 0.083% nebulizer solution Take 3 mLs (2.5 mg total) by nebulization every 6 (six) hours as needed for wheezing or shortness of breath. 07/13/15   Sinda Du, MD  amoxicillin (AMOXIL) 500 MG capsule  09/08/18   [provider]  aspirin EC 81 MG tablet Take 81 mg by mouth every morning.     [provider]  atorvastatin (LIPITOR) 80 MG tablet Take 1 tablet (80 mg total) by mouth daily at 6 PM. 11/24/16   Bhagat, [place], PA  azithromycin (ZITHROMAX Z-PAK) 250 MG tablet Take by package instructions 04/28/18   Sinda Du, MD  carvedilol (COREG) 6.25 MG tablet TAKE ONE TABLET BY MOUTH 2 TIMES A DAY. 01/20/18   Herminio Commons, MD  clorazepate (TRANXENE) 7.5 MG tablet Take 7.5 mg by mouth daily as needed for anxiety. For nerves    [provider]  doxycycline (VIBRAMYCIN) 100 MG capsule Take 1 capsule (100 mg total) by mouth 2 (two) times daily. One po bid x 7 days 10/29/18   Milton Ferguson, MD  ENTRESTO 24-26 MG TAKE 1 TABLET BY MOUTH TWICE DAILY. 09/12/18   Herminio Commons, MD  meclizine (ANTIVERT) 25 MG tablet Take 1 or 2  Every 6 hours Patient taking differently: Take 12.5-25 mg by mouth 4 (four) times daily as needed for dizziness.  04/03/17   Sinda Du, MD  nitroGLYCERIN (NITROSTAT) 0.4 MG SL tablet Place 1 tablet (0.4 mg total) under the tongue every 5 (five) minutes x 3 doses as needed for chest pain. 02/05/18   Sinda Du, MD  OXYGEN Inhale 2 L into the lungs every evening.    [provider]  predniSONE (DELTASONE) 10 MG tablet Take 2 tablets  (20 mg total) by mouth daily. 10/29/18   Milton Ferguson, MD  PROAIR HFA 108 [phone] BASE) MCG/ACT inhaler  Inhale 2 puffs into the lungs every 6 (six) hours as needed for wheezing or shortness of breath.  02/08/14   [provider]  SYMBICORT 160-4.5 MCG/ACT inhaler Inhale 2 puffs into the lungs 2 (two) times daily. 09/23/17   [provider]  traMADol (ULTRAM) 50 MG tablet Take 50 mg by mouth daily as needed for moderate pain or severe pain. Maximum dose= 8 tablets per day    [provider]     Allergies:     Allergies  Allergen Reactions  . Plavix [Clopidogrel Bisulfate] Itching    Severe itching  . Montelukast Sodium     Pt states she felt a "headache and her eyes started to Burn "   . Alprazolam Nausea And Vomiting  . Codeine Nausea And Vomiting  . Percodan [Oxycodone-Aspirin] Nausea And Vomiting  . Valium Nausea And Vomiting     Physical Exam:   Vitals  Blood pressure (!) 114/52, pulse 95, temperature 98.1 F (36.7 C), temperature source Oral, resp. rate (!) 22, height 5\' 2"  (1.575 m), weight 77.6 kg, SpO2 94 %.   1. General frail elderly female laying in bed in no apparent distress  2. Normal affect and insight, Not Suicidal or Homicidal, Awake Alert, Oriented X 3.  3. No F.N deficits, ALL C.Nerves Intact, Strength 5/5 all 4 extremities, Sensation intact all 4 extremities, Plantars down going.  4. Ears and Eyes appear Normal, Conjunctivae clear, PERRLA. Moist Oral Mucosa.  5. Supple Neck, No JVD, No cervical lymphadenopathy appriciated, No Carotid Bruits.  6. Symmetrical Chest wall movement, Good air movement bilaterally, scattered wheezing.  7. RRR, No Gallops, Rubs or Murmurs, No Parasternal Heave.  8. Positive Bowel Sounds, Abdomen Soft, No tenderness, No organomegaly appriciated,No rebound -guarding or rigidity.  9.  No Cyanosis, Normal Skin Turgor, No Skin Rash or Bruise.  10. Good muscle tone,  joints appear normal , no effusions, Normal  ROM.  11. No Palpable Lymph Nodes in Neck or Axillae    Data Review:    CBC Recent Labs  Lab 12/16/18 0916  WBC 8.3  HGB 12.5  HCT 40.3  PLT 248  MCV 84.8  MCH 26.3  MCHC 31.0  RDW 13.7  LYMPHSABS 2.3  MONOABS 0.8  EOSABS 1.0*  BASOSABS 0.1   ------------------------------------------------------------------------------------------------------------------  Chemistries  Recent Labs  Lab 12/16/18 0916  NA 142  K 3.5  CL 103  CO2 27  GLUCOSE 116*  BUN 12  CREATININE 0.56  CALCIUM 9.7   ------------------------------------------------------------------------------------------------------------------ estimated creatinine clearance is 53.2 mL/min (by C-G formula based on SCr of 0.56 mg/dL). ------------------------------------------------------------------------------------------------------------------ No results for input(s): TSH, T4TOTAL, T3FREE, THYROIDAB in the last 72 hours.  Invalid input(s): FREET3  Coagulation profile No results for input(s): INR, PROTIME in the last 168 hours. ------------------------------------------------------------------------------------------------------------------- No results for input(s): DDIMER in the last 72 hours. -------------------------------------------------------------------------------------------------------------------  Cardiac Enzymes No results for input(s): CKMB, TROPONINI, MYOGLOBIN in the last 168 hours.  Invalid input(s): CK ------------------------------------------------------------------------------------------------------------------    Component Value Date/Time   BNP 203.0 (H) 12/16/2018 0916     ---------------------------------------------------------------------------------------------------------------  Urinalysis    Component Value Date/Time   COLORURINE YELLOW 04/25/2018 0824   APPEARANCEUR CLEAR 04/25/2018 0824   LABSPEC 1.003 (L) 04/25/2018 0824   PHURINE 7.0 04/25/2018 0824    GLUCOSEU NEGATIVE 04/25/2018 0824   HGBUR NEGATIVE 04/25/2018 0824   BILIRUBINUR NEGATIVE 04/25/2018 0824   KETONESUR NEGATIVE 04/25/2018 0824   PROTEINUR NEGATIVE 04/25/2018 0824   UROBILINOGEN 0.2 08/20/2013 1610  NITRITE NEGATIVE 04/25/2018 0824   LEUKOCYTESUR MODERATE (A) 04/25/2018 0824    ----------------------------------------------------------------------------------------------------------------   Imaging Results:    Dg Chest Port 1 View  Result Date: 12/16/2018 CLINICAL DATA:  Shortness of breath for several weeks worse this morning, history COPD EXAM: PORTABLE CHEST 1 VIEW COMPARISON:  Portable exam 0923 hours compared to 10/29/2018 FINDINGS: Enlargement of cardiac silhouette. Mediastinal contours and pulmonary vascularity normal. Lungs clear. No pulmonary infiltrate, pleural effusion or pneumothorax. Bones demineralized with note of a LEFT shoulder prosthesis and RIGHT glenohumeral degenerative changes. IMPRESSION: Enlargement of cardiac silhouette. No acute abnormalities. Electronically Signed   By: Lavonia Dana M.D.   On: 12/16/2018 09:39    My personal review of EKG: Rhythm NSR, Rate  77 /min, QTc 487, with old left bundle branch block present on previous EKG   Assessment & Plan:    Active Problems:   Hypertension   Chronic combined systolic and diastolic CHF (congestive heart failure) (HCC)   COPD (chronic obstructive pulmonary disease) (HCC)   CAD (coronary artery disease)   Acute on chronic respiratory failure with hypoxia ([facility])    COPD exacerbation  -Presents with wheezing, shortness of breath and increased work of breathing, this is most likely provoked to her recent episodes of testing, I will continue with the scheduled duo nebs, will continue with IV Solu-Medrol 40 mg IV every 12 hours, will continue with [facility] .,  She has no productive cough, so we will hold on initiating antibiotic .  Chronic hypoxic respiratory failure -At baseline, she was on 2 L nasal  cannula intermittently  Chronic combined systolic and diastolic congestive heart failure - Echo done in July 2018 showing EF 40 to 45% and grade 1 diastolic dysfunction. -She appears to be a euvolemic on physical exam -Continue with home medication include Coreg and Entresto  Hypertension -Continue with Coreg and Entresto  Anxiety -Continue with home medication ed  Chronic pain -Continue home tramadol as needed    DVT Prophylaxis Heparin - SCDs   AM Labs Ordered, also please review Full Orders  Family Communication: Admission, patients condition and plan of care including tests being ordered have been discussed with the patient  who indicate understanding and agree with the plan and Code Status.  Code Status full  Likely DC to  Home  Condition GUARDED    Consults called:  None  Admission status: Observation  Time spent in minutes : 55 minutes   Phillips Climes M.D on 12/16/2018 at 5:28 PM  Between 7am to 7pm - Pager - [phone]. After 7pm go to [email or web address] - password [facility]  [facility] - Office  [phone]

## 2018-12-16 NOTE — ED Provider Notes (Signed)
[facility] EMERGENCY DEPARTMENT Provider Note   CSN: [id number] Arrival date & time: 12/16/18  0901     History   Chief Complaint Chief Complaint  Patient presents with   Shortness of Breath    HPI [name] is a 83 y.o. female.     HPI  Pt was seen at 0915.  Per pt, c/o gradual onset and worsening of persistent cough, wheezing and SOB for the past 2 days. States her symptoms began after she was "dusting" without wearing a mask.  Has been using home O2, nebs without relief. EMS gave pt short neb and IV solumedrol en route with "some" improvement.  Denies CP/palpitations, no back pain, no abd pain, no N/V/D, no fevers, no rash.    Past Medical History:  Diagnosis Date   Anxiety    Cervical disc disorder with myelopathy, unspecified cervical region    Chronic systolic (congestive) heart failure (HCC)    COPD (chronic obstructive pulmonary disease) (HCC)    Essential hypertension    Hemorrhoids    Liver mass    Lung, cysts, congenital    Left lung cyst   Myocardial infarction ([place])    Nephrolithiasis    Embedded   Nonischemic cardiomyopathy ([place])    LVEF 35-40% 2015   On home O2    Osteoarthritis    Thoracic ascending aortic aneurysm (HCC)    4.3 cm April 2016    Patient Active Problem List   Diagnosis Date Noted   Acute on chronic respiratory failure with hypoxia ([place]) 04/24/2018   Chronic stable angina ([place]) 04/24/2018   Physical deconditioning 04/24/2018   CAD (coronary artery disease) 02/03/2018   Ischemic cardiomyopathy 11/25/2017   Dizziness 04/01/2017   COPD (chronic obstructive pulmonary disease) ([place]) 04/01/2017   CAD S/P percutaneous coronary angioplasty 04/01/2017   NSTEMI (non-ST elevated myocardial infarction) ([place]) 11/23/2016   Hypertensive cardiovascular disease 10/03/2015   Protein-calorie malnutrition, severe 07/13/2015   Demand ischemia of myocardium ([place]) 07/13/2015   Nonischemic cardiomyopathy ([place])     Atypical chest pain    Left bundle branch block    COPD exacerbation ([place]) 07/11/2015   Flu-like symptoms 07/11/2015   Elevated troponin 07/11/2015   Acute bronchitis 07/11/2015   GERD (gastroesophageal reflux disease) 07/11/2015   Bronchospasm    Ascending aortic aneurysm ([place]) 01/27/2015   Chronic combined systolic and diastolic CHF (congestive heart failure) ([place]) 01/27/2015   Chest pain at rest 01/27/2015   Essential hypertension 01/27/2015   Anxiety 01/27/2015   Chronic pain 01/27/2015   Chest pain 08/20/2013   Hypertension 08/20/2013   Contusion of left knee 08/07/2012   Sprain of wrist 08/07/2012   Liver mass 05/21/2011   Abdominal pain 05/21/2011   Bronchitis 05/21/2011   De Quervain's disease (tenosynovitis) 04/02/2011   SHOULDER PAIN 01/19/2009   CERVICALGIA 01/19/2009   IMPINGEMENT SYNDROME 01/19/2009    Past Surgical History:  Procedure Laterality Date   Benign breast tumors     CHOLECYSTECTOMY     COLONOSCOPY     COLONOSCOPY N/A 09/22/2014   Procedure: COLONOSCOPY;  Surgeon: Rogene Houston, MD;  Location: [facility];  Service: Endoscopy;  Laterality: N/A;  830 -- to be done in OR under fluoro   Complete hysterectomy     CORONARY STENT INTERVENTION N/A 11/23/2016   Procedure: Coronary Stent Intervention;  Surgeon: Martinique, Peter M, MD;  Location: [place] [facility] LAB;  Service: Cardiovascular;  Laterality: N/A;   LEFT HEART CATH AND CORONARY ANGIOGRAPHY N/A 11/23/2016  Procedure: Left Heart Cath and Coronary Angiography;  Surgeon: Martinique, Peter M, MD;  Location: [place] CV LAB;  Service: Cardiovascular;  Laterality: N/A;   TONSILLECTOMY AND ADENOIDECTOMY       OB History   No obstetric history on file.      Home Medications    Prior to Admission medications   Medication Sig Start Date End Date Taking? Authorizing Provider  albuterol (PROVENTIL) (2.5 MG/3ML) 0.083% nebulizer solution Take 3 mLs (2.5 mg total) by  nebulization every 6 (six) hours as needed for wheezing or shortness of breath. 07/13/15   Sinda Du, MD  amoxicillin (AMOXIL) 500 MG capsule  09/08/18   [provider]  aspirin EC 81 MG tablet Take 81 mg by mouth every morning.     [provider]  atorvastatin (LIPITOR) 80 MG tablet Take 1 tablet (80 mg total) by mouth daily at 6 PM. 11/24/16   Bhagat, [place], PA  azithromycin (ZITHROMAX Z-PAK) 250 MG tablet Take by package instructions 04/28/18   Sinda Du, MD  carvedilol (COREG) 6.25 MG tablet TAKE ONE TABLET BY MOUTH 2 TIMES A DAY. 01/20/18   Herminio Commons, MD  clorazepate (TRANXENE) 7.5 MG tablet Take 7.5 mg by mouth daily as needed for anxiety. For nerves    [provider]  doxycycline (VIBRAMYCIN) 100 MG capsule Take 1 capsule (100 mg total) by mouth 2 (two) times daily. One po bid x 7 days 10/29/18   Milton Ferguson, MD  ENTRESTO 24-26 MG TAKE 1 TABLET BY MOUTH TWICE DAILY. 09/12/18   Herminio Commons, MD  meclizine (ANTIVERT) 25 MG tablet Take 1 or 2  Every 6 hours Patient taking differently: Take 12.5-25 mg by mouth 4 (four) times daily as needed for dizziness.  04/03/17   Sinda Du, MD  nitroGLYCERIN (NITROSTAT) 0.4 MG SL tablet Place 1 tablet (0.4 mg total) under the tongue every 5 (five) minutes x 3 doses as needed for chest pain. 02/05/18   Sinda Du, MD  OXYGEN Inhale 2 L into the lungs every evening.    [provider]  predniSONE (DELTASONE) 10 MG tablet Take 2 tablets (20 mg total) by mouth daily. 10/29/18   Milton Ferguson, MD  PROAIR HFA 108 (90 BASE) MCG/ACT inhaler Inhale 2 puffs into the lungs every 6 (six) hours as needed for wheezing or shortness of breath.  02/08/14   [provider]  SYMBICORT 160-4.5 MCG/ACT inhaler Inhale 2 puffs into the lungs 2 (two) times daily. 09/23/17   [provider]  traMADol (ULTRAM) 50 MG tablet Take 50 mg by mouth daily as needed for moderate pain or severe pain.  Maximum dose= 8 tablets per day    [provider]    Family History Family History  Problem Relation Age of Onset   Heart disease Mother    Aneurysm Father    Lung cancer Brother    Heart disease Sister    Diabetes Brother    Heart disease Brother     Social History Social History   Tobacco Use   Smoking status: Former Smoker    Packs/day: 0.25    Years: 31.00    Pack years: 7.75    Types: Cigarettes    Start date: 05/14/1977    Quit date: 05/28/2008    Years since quitting: 10.5   Smokeless tobacco: Never Used  Substance Use Topics   Alcohol use: No    Alcohol/week: 0.0 standard drinks   Drug use: No  Allergies   Plavix [clopidogrel bisulfate], Montelukast sodium, Alprazolam, Codeine, Percodan [oxycodone-aspirin], and Valium   Review of Systems Review of Systems ROS: Statement: All systems negative except as marked or noted in the HPI; Constitutional: Negative for fever and chills. ; ; Eyes: Negative for eye pain, redness and discharge. ; ; ENMT: Negative for ear pain, hoarseness, nasal congestion, sinus pressure and sore throat. ; ; Cardiovascular: Negative for chest pain, palpitations, diaphoresis, and peripheral edema. ; ; Respiratory: +SOB, wheezing, cough. Negative for stridor. ; ; Gastrointestinal: Negative for nausea, vomiting, diarrhea, abdominal pain, blood in stool, hematemesis, jaundice and rectal bleeding. . ; ; Genitourinary: Negative for dysuria, flank pain and hematuria. ; ; Musculoskeletal: Negative for back pain and neck pain. Negative for swelling and trauma.; ; Skin: Negative for pruritus, rash, abrasions, blisters, bruising and skin lesion.; ; Neuro: Negative for headache, lightheadedness and neck stiffness. Negative for weakness, altered level of consciousness, altered mental status, extremity weakness, paresthesias, involuntary movement, seizure and syncope.       Physical Exam Updated Vital Signs BP (!) 152/62    Pulse 82     Temp 98.1 F (36.7 C) (Oral)    Resp 18    Ht 5\' 2"  (1.575 m)    Wt 77.6 kg    SpO2 97%    BMI 31.28 kg/m  . Patient Vitals for the past 24 hrs:  BP Temp Temp src Pulse Resp SpO2 Height Weight  12/16/18 1054 -- -- -- -- -- 97 % -- --  12/16/18 0928 -- -- -- -- -- 100 % -- --  12/16/18 0919 (!) 152/62 -- -- 82 18 100 % -- --  12/16/18 0909 -- 98.1 F (36.7 C) Oral 81 20 100 % -- --  12/16/18 0906 -- -- -- -- -- -- 5\' 2"  (1.575 m) 77.6 kg      Physical Exam 0920: Physical examination:  Nursing notes reviewed; Vital signs and O2 SAT reviewed;  Constitutional: Well developed, Well nourished, Well hydrated, Uncomfortable appearing.;; Head:  Normocephalic, atraumatic; Eyes: EOMI, PERRL, No scleral icterus; ENMT: Mouth and pharynx normal, Mucous membranes moist; Neck: Supple, Full range of motion, No lymphadenopathy; Cardiovascular: Regular rate and rhythm, No gallop; Respiratory: Breath sounds diminished & equal bilaterally, faint exp wheezes bilat. No audible wheezing. Speaking sentences, sitting upright.; Chest: Nontender, Movement normal; Abdomen: Soft, Nontender, Nondistended, Normal bowel sounds; Genitourinary: No CVA tenderness; Extremities: Pulses normal, No tenderness, No edema, No calf edema or asymmetry.; Neuro: AA&Ox3, Major CN grossly intact.  Speech clear. No gross focal motor or sensory deficits in extremities.; Skin: Color normal, Warm, Dry.   ED Treatments / Results  Labs (all labs ordered are listed, but only abnormal results are displayed)   EKG EKG Interpretation  Date/Time:  Tuesday December 16 2018 09:16:08 EDT Ventricular Rate:  77 PR Interval:    QRS Duration: 167 QT Interval:  430 QTC Calculation: 487 R Axis:   -6 Text Interpretation:  Sinus rhythm Left bundle branch block Baseline wander When compared with ECG of 10/29/2018, 07/25/2018 No significant change was found Confirmed by Francine Graven [phone]) on 12/16/2018 11:34:19  AM   Radiology   Procedures Procedures (including critical care time)  Medications Ordered in ED Medications  albuterol (PROVENTIL,VENTOLIN) solution continuous neb (10 mg/hr Nebulization Given 12/16/18 1054)  ipratropium (ATROVENT) nebulizer solution 1 mg (1 mg Nebulization Given 12/16/18 1053)     Initial Impression / Assessment and Plan / ED Course  I have reviewed the triage vital signs and  the nursing notes.  Pertinent labs & imaging results that were available during my care of the patient were reviewed by me and considered in my medical decision making (see chart for details).    MDM Reviewed: previous chart, nursing note and vitals Reviewed previous: labs and ECG Interpretation: labs, ECG and x-ray Total time providing critical care: 30-74 minutes. This excludes time spent performing separately reportable procedures and services. Consults: admitting MD   CRITICAL CARE Performed by: Francine Graven Total critical care time: 35 minutes Critical care time was exclusive of separately billable procedures and treating other patients. Critical care was necessary to treat or prevent imminent or life-threatening deterioration. Critical care was time spent personally by me on the following activities: development of treatment plan with patient and/or surrogate as well as nursing, discussions with consultants, evaluation of patient's response to treatment, examination of patient, obtaining history from patient or surrogate, ordering and performing treatments and interventions, ordering and review of laboratory studies, ordering and review of radiographic studies, pulse oximetry and re-evaluation of patient's condition.   Results for orders placed or performed during the hospital encounter of 12/16/18  SARS Coronavirus 2 [facility] order, Performed in [facility] hospital lab) Nasopharyngeal Nasopharyngeal Swab   Specimen: Nasopharyngeal Swab  Result Value Ref Range   SARS  Coronavirus 2 NEGATIVE NEGATIVE  Basic metabolic panel  Result Value Ref Range   Sodium 142 135 - 145 mmol/L   Potassium 3.5 3.5 - 5.1 mmol/L   Chloride 103 98 - 111 mmol/L   CO2 27 22 - 32 mmol/L   Glucose, Bld 116 (H) 70 - 99 mg/dL   BUN 12 8 - 23 mg/dL   Creatinine, Ser 0.56 0.44 - 1.00 mg/dL   Calcium 9.7 8.9 - 10.3 mg/dL   GFR calc non Af Amer >60 >60 mL/min   GFR calc Af Amer >60 >60 mL/min   Anion gap 12 5 - 15  CBC with Differential  Result Value Ref Range   WBC 8.3 4.0 - 10.5 K/uL   RBC 4.75 3.87 - 5.11 MIL/uL   Hemoglobin 12.5 12.0 - 15.0 g/dL   HCT 40.3 36.0 - 46.0 %   MCV 84.8 80.0 - 100.0 fL   MCH 26.3 26.0 - 34.0 pg   MCHC 31.0 30.0 - 36.0 g/dL   RDW 13.7 11.5 - 15.5 %   Platelets 248 150 - 400 K/uL   nRBC 0.0 0.0 - 0.2 %   Neutrophils Relative % 49 %   Neutro Abs 4.0 1.7 - 7.7 K/uL   Lymphocytes Relative 28 %   Lymphs Abs 2.3 0.7 - 4.0 K/uL   Monocytes Relative 10 %   Monocytes Absolute 0.8 0.1 - 1.0 K/uL   Eosinophils Relative 12 %   Eosinophils Absolute 1.0 (H) 0.0 - 0.5 K/uL   Basophils Relative 1 %   Basophils Absolute 0.1 0.0 - 0.1 K/uL   Immature Granulocytes 0 %   Abs Immature Granulocytes 0.02 0.00 - 0.07 K/uL  Brain natriuretic peptide  Result Value Ref Range   B Natriuretic Peptide 203.0 (H) 0.0 - 100.0 pg/mL  Troponin I (High Sensitivity)  Result Value Ref Range   Troponin I (High Sensitivity) 11 <18 ng/L  Troponin I (High Sensitivity)  Result Value Ref Range   Troponin I (High Sensitivity) 8 <18 ng/L   Dg Chest Port 1 View Result Date: 12/16/2018 CLINICAL DATA:  Shortness of breath for several weeks worse this morning, history COPD EXAM: PORTABLE CHEST 1 VIEW COMPARISON:  Portable exam 0923 hours compared to 10/29/2018 FINDINGS: Enlargement of cardiac silhouette. Mediastinal contours and pulmonary vascularity normal. Lungs clear. No pulmonary infiltrate, pleural effusion or pneumothorax. Bones demineralized with note of a LEFT shoulder  prosthesis and RIGHT glenohumeral degenerative changes. IMPRESSION: Enlargement of cardiac silhouette. No acute abnormalities. Electronically Signed   By: Lavonia Dana M.D.   On: 12/16/2018 09:39    Keni Wafer Fountain was evaluated in Emergency Department on 12/16/2018 for the symptoms described in the history of present illness. She was evaluated in the context of the global COVID-19 pandemic, which necessitated consideration that the patient might be at risk for infection with the SARS-CoV-2 virus that causes COVID-19. Institutional protocols and algorithms that pertain to the evaluation of patients at risk for COVID-19 are in a state of rapid change based on information released by regulatory bodies including the CDC and federal and state organizations. These policies and algorithms were followed during the patient's care in the ED.   1305:  After COVID testing negative, hour long neb started. After neb: pt appears more comfortable at rest, +moist cough, Sats 97 % on O2 2L N/C, lungs continue diminished. Pt ambulated short distance from stretcher to doors in exam room: pt's O2 Sats dropped to 90-91 % R/A with pt c/o increasing SOB with wheezing. Pt escorted back to stretcher with O2 Sats slowly increasing to 97 % on O2 2L N/C. Will admit.  T/C returned from [facility], case discussed, including:  HPI, pertinent PM/SHx, VS/PE, dx testing, ED course and treatment:  Agreeable to admit.     Final Clinical Impressions(s) / ED Diagnoses   Final diagnoses:  None    ED Discharge Orders    None       Francine Graven, DO 12/20/18 [id number]

## 2018-12-16 NOTE — ED Triage Notes (Signed)
Pt has history of copd, worsening sob for the past few weeks.  Reports this morning was extremely sob.  EMS reports o2 wsat 96% on 2.5liters.  Also c/o chest pain, worse with palpation.  Pt reports nonproductive cough.  EMS gave 2.5mg  albuterol neb and 125mg  solumedrol IV.

## 2018-12-16 NOTE — ED Notes (Signed)
ED TO INPATIENT HANDOFF REPORT  ED Nurse Name and Phone #: [phone]  S Name/Age/Gender [name] 83 y.o. female Room/Bed: APA10/APA10  Code Status   Code Status: Prior  Home/SNF/Other Home Patient oriented to: self, place, time and situation Is this baseline? Yes   Triage Complete: Triage complete  Chief Complaint sob  Triage Note Pt has history of copd, worsening sob for the past few weeks.  Reports this morning was extremely sob.  EMS reports o2 wsat 96% on 2.5liters.  Also c/o chest pain, worse with palpation.  Pt reports nonproductive cough.  EMS gave 2.5mg  albuterol neb and 125mg  solumedrol IV.     Allergies Allergies  Allergen Reactions  . Plavix [Clopidogrel Bisulfate] Itching    Severe itching  . Montelukast Sodium     Pt states she felt a "headache and her eyes started to Burn "   . Alprazolam Nausea And Vomiting  . Codeine Nausea And Vomiting  . Percodan [Oxycodone-Aspirin] Nausea And Vomiting  . Valium Nausea And Vomiting    Level of Care/Admitting Diagnosis ED Disposition    ED Disposition Condition Comment   Admit  Hospital Area: [facility] [[id number]]  Level of Care: Med-Surg [16]  Covid Evaluation: Asymptomatic Screening Protocol (No Symptoms)  Diagnosis: COPD (chronic obstructive pulmonary disease) [facility]) [267124]  Admitting Physician: Manfred Shirts  Attending Physician: Waldron Labs, DAWOOD S [[id number]]  PT Class (Do Not Modify): Observation [104]  PT Acc Code (Do Not Modify): Observation [10022]       B Medical/Surgery History Past Medical History:  Diagnosis Date  . Anxiety   . Cervical disc disorder with myelopathy, unspecified cervical region   . Chronic systolic (congestive) heart failure ([place])   . COPD (chronic obstructive pulmonary disease) ([place])   . Essential hypertension   . Hemorrhoids   . Liver mass   . Lung, cysts, congenital    Left lung cyst  . Myocardial infarction ([place])   . Nephrolithiasis     Embedded  . Nonischemic cardiomyopathy ([place])    LVEF 35-40% 2015  . On home O2   . Osteoarthritis   . Thoracic ascending aortic aneurysm (HCC)    4.3 cm April 2016   Past Surgical History:  Procedure Laterality Date  . Benign breast tumors    . CHOLECYSTECTOMY    . COLONOSCOPY    . COLONOSCOPY N/A 09/22/2014   Procedure: COLONOSCOPY;  Surgeon: Rogene Houston, MD;  Location[facility];  Service: Endoscopy;  Laterality: N/A;  830 -- to be done in OR under fluoro  . Complete hysterectomy    . CORONARY STENT INTERVENTION N/A 11/23/2016   Procedure: Coronary Stent Intervention;  Surgeon: Martinique, Peter M, MD;  Location: [place] CV LAB;  Service: Cardiovascular;  Laterality: N/A;  . LEFT HEART CATH AND CORONARY ANGIOGRAPHY N/A 11/23/2016   Procedure: Left Heart Cath and Coronary Angiography;  Surgeon: Martinique, Peter M, MD;  Location: [place] CV LAB;  Service: Cardiovascular;  Laterality: N/A;  . TONSILLECTOMY AND ADENOIDECTOMY       A IV Location/Drains/Wounds Patient Lines/Drains/Airways Status   Active Line/Drains/Airways    Name:   Placement date:   Placement time:   Site:   Days:   Peripheral IV 12/16/18 Right Forearm   12/16/18    0922    Forearm   less than 1   Peripheral IV 12/16/18 Left Forearm   12/16/18    0900    Forearm   less than 1  Intake/Output Last 24 hours No intake or output data in the 24 hours ending 12/16/18 1942  Labs/Imaging Results for orders placed or performed during the hospital encounter of 12/16/18 (from the past 48 hour(s))  SARS Coronavirus 2 [facility] order, Performed in [facility] hospital lab) Nasopharyngeal Nasopharyngeal Swab     Status: None   Collection Time: 12/16/18  9:15 AM   Specimen: Nasopharyngeal Swab  Result Value Ref Range   SARS Coronavirus 2 NEGATIVE NEGATIVE    Comment: (NOTE) If result is NEGATIVE SARS-CoV-2 target nucleic acids are NOT DETECTED. The SARS-CoV-2 RNA is generally detectable in upper and lower   respiratory specimens during the acute phase of infection. The lowest  concentration of SARS-CoV-2 viral copies this assay can detect is 250  copies / mL. A negative result does not preclude SARS-CoV-2 infection  and should not be used as the sole basis for treatment or other  patient management decisions.  A negative result may occur with  improper specimen collection / handling, submission of specimen other  than nasopharyngeal swab, presence of viral mutation(s) within the  areas targeted by this assay, and inadequate number of viral copies  (<250 copies / mL). A negative result must be combined with clinical  observations, patient history, and epidemiological information. If result is POSITIVE SARS-CoV-2 target nucleic acids are DETECTED. The SARS-CoV-2 RNA is generally detectable in upper and lower  respiratory specimens dur ing the acute phase of infection.  Positive  results are indicative of active infection with SARS-CoV-2.  Clinical  correlation with patient history and other diagnostic information is  necessary to determine patient infection status.  Positive results do  not rule out bacterial infection or co-infection with other viruses. If result is PRESUMPTIVE POSTIVE SARS-CoV-2 nucleic acids MAY BE PRESENT.   A presumptive positive result was obtained on the submitted specimen  and confirmed on repeat testing.  While 2019 novel coronavirus  (SARS-CoV-2) nucleic acids may be present in the submitted sample  additional confirmatory testing may be necessary for epidemiological  and / or clinical management purposes  to differentiate between  SARS-CoV-2 and other Sarbecovirus currently known to infect humans.  If clinically indicated additional testing with an alternate test  methodology [phone]) is advised. The SARS-CoV-2 RNA is generally  detectable in upper and lower respiratory sp ecimens during the acute  phase of infection. The expected result is Negative. Fact  Sheet for Patients:  StrictlyIdeas.no Fact Sheet for Healthcare Providers: BankingDealers.co.za This test is not yet approved or cleared by the [place] FDA and has been authorized for detection and/or diagnosis of SARS-CoV-2 by FDA under an Emergency Use Authorization (EUA).  This EUA will remain in effect (meaning this test can be used) for the duration of the COVID-19 declaration under Section 564(b)(1) of the Act, 21 U.S.C. section 360bbb-3(b)(1), unless the authorization is terminated or revoked sooner. Performed at [facility], [address], [place], [place] [address]   Basic metabolic panel     Status: Abnormal   Collection Time: 12/16/18  9:16 AM  Result Value Ref Range   Sodium 142 135 - 145 mmol/L   Potassium 3.5 3.5 - 5.1 mmol/L   Chloride 103 98 - 111 mmol/L   CO2 27 22 - 32 mmol/L   Glucose, Bld 116 (H) 70 - 99 mg/dL   BUN 12 8 - 23 mg/dL   Creatinine, Ser 0.56 0.44 - 1.00 mg/dL   Calcium 9.7 8.9 - 10.3 mg/dL   GFR calc non  Af Amer >60 >60 mL/min   GFR calc Af Amer >60 >60 mL/min   Anion gap 12 5 - 15    Comment: Performed at [facility], [address] [place], [place] [address]  Troponin I (High Sensitivity)     Status: None   Collection Time: 12/16/18  9:16 AM  Result Value Ref Range   Troponin I (High Sensitivity) 11 <18 ng/L    Comment: (NOTE) Elevated high sensitivity troponin I (hsTnI) values and significant  changes across serial measurements may suggest ACS but many other  chronic and acute conditions are known to elevate hsTnI results.  Refer to the "Links" section for chest pain algorithms and additional  guidance. Performed at [facility], [address], [place], [place] [address]   CBC with Differential     Status: Abnormal   Collection Time: 12/16/18  9:16 AM  Result Value Ref Range   WBC 8.3 4.0 - 10.5 K/uL   RBC 4.75 3.87 - 5.11 MIL/uL   Hemoglobin 12.5 12.0 - 15.0 g/dL   HCT 40.3 36.0 -  46.0 %   MCV 84.8 80.0 - 100.0 fL   MCH 26.3 26.0 - 34.0 pg   MCHC 31.0 30.0 - 36.0 g/dL   RDW 13.7 11.5 - 15.5 %   Platelets 248 150 - 400 K/uL   nRBC 0.0 0.0 - 0.2 %   Neutrophils Relative % 49 %   Neutro Abs 4.0 1.7 - 7.7 K/uL   Lymphocytes Relative 28 %   Lymphs Abs 2.3 0.7 - 4.0 K/uL   Monocytes Relative 10 %   Monocytes Absolute 0.8 0.1 - 1.0 K/uL   Eosinophils Relative 12 %   Eosinophils Absolute 1.0 (H) 0.0 - 0.5 K/uL   Basophils Relative 1 %   Basophils Absolute 0.1 0.0 - 0.1 K/uL   Immature Granulocytes 0 %   Abs Immature Granulocytes 0.02 0.00 - 0.07 K/uL    Comment: Performed at [facility], [address], [place], [place] [address]  Brain natriuretic peptide     Status: Abnormal   Collection Time: 12/16/18  9:16 AM  Result Value Ref Range   B Natriuretic Peptide 203.0 (H) 0.0 - 100.0 pg/mL    Comment: Performed at [facility], [address] [place], [place] [address]  Troponin I (High Sensitivity)     Status: None   Collection Time: 12/16/18 12:30 PM  Result Value Ref Range   Troponin I (High Sensitivity) 8 <18 ng/L    Comment: (NOTE) Elevated high sensitivity troponin I (hsTnI) values and significant  changes across serial measurements may suggest ACS but many other  chronic and acute conditions are known to elevate hsTnI results.  Refer to the "Links" section for chest pain algorithms and additional  guidance. Performed at [facility], [address], [place], [place] [address]    Dg Chest Port 1 View  Result Date: 12/16/2018 CLINICAL DATA:  Shortness of breath for several weeks worse this morning, history COPD EXAM: PORTABLE CHEST 1 VIEW COMPARISON:  Portable exam 0923 hours compared to 10/29/2018 FINDINGS: Enlargement of cardiac silhouette. Mediastinal contours and pulmonary vascularity normal. Lungs clear. No pulmonary infiltrate, pleural effusion or pneumothorax. Bones demineralized with note of a LEFT shoulder prosthesis and RIGHT glenohumeral  degenerative changes. IMPRESSION: Enlargement of cardiac silhouette. No acute abnormalities. Electronically Signed   By: Lavonia Dana M.D.   On: 12/16/2018 09:39    Pending Labs [employer] (From admission, onward)    Start  Ordered   12/17/18 9450  Basic metabolic panel  Tomorrow morning,   R     12/16/18 1929   12/17/18 0500  CBC  Tomorrow morning,   R     12/16/18 1929          Vitals/Pain Today's Vitals   12/16/18 1830 12/16/18 1900 12/16/18 1926 12/16/18 1930  BP: 125/61 107/61  (!) 141/71  Pulse: 91 93  95  Resp: (!) 21 (!) 22  (!) 23  Temp:      TempSrc:      SpO2: 95% 93%  93%  Weight:      Height:      PainSc:   0-No pain     Isolation Precautions No active isolations  Medications Medications  albuterol (VENTOLIN HFA) 108 (90 Base) MCG/ACT inhaler 2 puff (has no administration in time range)  albuterol (PROVENTIL) (2.5 MG/3ML) 0.083% nebulizer solution 2.5 mg (has no administration in time range)  nitroGLYCERIN (NITROSTAT) SL tablet 0.4 mg (has no administration in time range)  meclizine (ANTIVERT) tablet 12.5-25 mg (has no administration in time range)  acetaminophen (TYLENOL) tablet 650 mg (has no administration in time range)    Or  acetaminophen (TYLENOL) suppository 650 mg (has no administration in time range)  methylPREDNISolone sodium succinate (SOLU-MEDROL) 40 mg/mL injection 40 mg (40 mg Intravenous Not Given 12/16/18 1914)  albuterol (PROVENTIL,VENTOLIN) solution continuous neb (10 mg/hr Nebulization Given 12/16/18 1054)  ipratropium (ATROVENT) nebulizer solution 1 mg (1 mg Nebulization Given 12/16/18 1053)    Mobility walks Moderate fall risk   Focused Assessments    R Recommendations: See Admitting Provider Note  Report given to:   Additional Notes:

## 2018-12-17 ENCOUNTER — Inpatient Hospital Stay (HOSPITAL_COMMUNITY): Payer: Medicare Other

## 2018-12-17 DIAGNOSIS — G8929 Other chronic pain: Secondary | ICD-10-CM | POA: Diagnosis present

## 2018-12-17 DIAGNOSIS — K219 Gastro-esophageal reflux disease without esophagitis: Secondary | ICD-10-CM | POA: Diagnosis present

## 2018-12-17 DIAGNOSIS — I5042 Chronic combined systolic (congestive) and diastolic (congestive) heart failure: Secondary | ICD-10-CM | POA: Diagnosis present

## 2018-12-17 DIAGNOSIS — Z955 Presence of coronary angioplasty implant and graft: Secondary | ICD-10-CM | POA: Diagnosis not present

## 2018-12-17 DIAGNOSIS — I5022 Chronic systolic (congestive) heart failure: Secondary | ICD-10-CM | POA: Diagnosis not present

## 2018-12-17 DIAGNOSIS — Z7982 Long term (current) use of aspirin: Secondary | ICD-10-CM | POA: Diagnosis not present

## 2018-12-17 DIAGNOSIS — Z87891 Personal history of nicotine dependence: Secondary | ICD-10-CM | POA: Diagnosis not present

## 2018-12-17 DIAGNOSIS — I340 Nonrheumatic mitral (valve) insufficiency: Secondary | ICD-10-CM

## 2018-12-17 DIAGNOSIS — I255 Ischemic cardiomyopathy: Secondary | ICD-10-CM | POA: Diagnosis present

## 2018-12-17 DIAGNOSIS — I712 Thoracic aortic aneurysm, without rupture: Secondary | ICD-10-CM | POA: Diagnosis present

## 2018-12-17 DIAGNOSIS — I361 Nonrheumatic tricuspid (valve) insufficiency: Secondary | ICD-10-CM

## 2018-12-17 DIAGNOSIS — Z888 Allergy status to other drugs, medicaments and biological substances status: Secondary | ICD-10-CM | POA: Diagnosis not present

## 2018-12-17 DIAGNOSIS — I110 Hypertensive heart disease with heart failure: Secondary | ICD-10-CM | POA: Diagnosis present

## 2018-12-17 DIAGNOSIS — Z20828 Contact with and (suspected) exposure to other viral communicable diseases: Secondary | ICD-10-CM | POA: Diagnosis present

## 2018-12-17 DIAGNOSIS — M5000 Cervical disc disorder with myelopathy, unspecified cervical region: Secondary | ICD-10-CM | POA: Diagnosis present

## 2018-12-17 DIAGNOSIS — Z801 Family history of malignant neoplasm of trachea, bronchus and lung: Secondary | ICD-10-CM | POA: Diagnosis not present

## 2018-12-17 DIAGNOSIS — I428 Other cardiomyopathies: Secondary | ICD-10-CM | POA: Diagnosis present

## 2018-12-17 DIAGNOSIS — Z833 Family history of diabetes mellitus: Secondary | ICD-10-CM | POA: Diagnosis not present

## 2018-12-17 DIAGNOSIS — J441 Chronic obstructive pulmonary disease with (acute) exacerbation: Secondary | ICD-10-CM | POA: Diagnosis present

## 2018-12-17 DIAGNOSIS — Z8249 Family history of ischemic heart disease and other diseases of the circulatory system: Secondary | ICD-10-CM | POA: Diagnosis not present

## 2018-12-17 DIAGNOSIS — Z7952 Long term (current) use of systemic steroids: Secondary | ICD-10-CM | POA: Diagnosis not present

## 2018-12-17 DIAGNOSIS — I2510 Atherosclerotic heart disease of native coronary artery without angina pectoris: Secondary | ICD-10-CM | POA: Diagnosis present

## 2018-12-17 DIAGNOSIS — I252 Old myocardial infarction: Secondary | ICD-10-CM | POA: Diagnosis not present

## 2018-12-17 DIAGNOSIS — Z885 Allergy status to narcotic agent status: Secondary | ICD-10-CM | POA: Diagnosis not present

## 2018-12-17 DIAGNOSIS — F419 Anxiety disorder, unspecified: Secondary | ICD-10-CM | POA: Diagnosis present

## 2018-12-17 DIAGNOSIS — J9621 Acute and chronic respiratory failure with hypoxia: Secondary | ICD-10-CM | POA: Diagnosis present

## 2018-12-17 DIAGNOSIS — Z7951 Long term (current) use of inhaled steroids: Secondary | ICD-10-CM | POA: Diagnosis not present

## 2018-12-17 DIAGNOSIS — R0602 Shortness of breath: Secondary | ICD-10-CM | POA: Diagnosis present

## 2018-12-17 LAB — ECHOCARDIOGRAM COMPLETE
Height: 62 in
Weight: 2716.07 oz

## 2018-12-17 LAB — BASIC METABOLIC PANEL
Anion gap: 11 (ref 5–15)
BUN: 15 mg/dL (ref 8–23)
CO2: 25 mmol/L (ref 22–32)
Calcium: 10.1 mg/dL (ref 8.9–10.3)
Chloride: 104 mmol/L (ref 98–111)
Creatinine, Ser: 0.59 mg/dL (ref 0.44–1.00)
GFR calc Af Amer: >60 mL/min (ref >60)
GFR calc non Af Amer: >60 mL/min (ref >60)
Glucose, Bld: 114 mg/dL — ABNORMAL HIGH (ref 70–99)
Potassium: 4 mmol/L (ref 3.5–5.1)
Sodium: 140 mmol/L (ref 135–145)

## 2018-12-17 LAB — CBC
HCT: 37.4 % (ref 36.0–46.0)
Hemoglobin: 11.6 g/dL — ABNORMAL LOW (ref 12.0–15.0)
MCH: 26.4 pg (ref 26.0–34.0)
MCHC: 31 g/dL (ref 30.0–36.0)
MCV: 85 fL (ref 80.0–100.0)
Platelets: 270 10*3/uL (ref 150–400)
RBC: 4.4 MIL/uL (ref 3.87–5.11)
RDW: 13.7 % (ref 11.5–15.5)
WBC: 9.6 10*3/uL (ref 4.0–10.5)
nRBC: 0 % (ref 0.0–0.2)

## 2018-12-17 NOTE — Progress Notes (Signed)
*  PRELIMINARY RESULTS* Echocardiogram 2D Echocardiogram has been performed.  Leavy Cella 12/17/2018, 2:55 PM

## 2018-12-17 NOTE — TOC Initial Note (Signed)
Transition of Care [facility]) - Initial/Assessment Note    Patient Details  Name: [name] MRN: [id number] Date of Birth: 1936/01/19  [facility] CM/SW Contact:    Boneta Lucks, RN Phone Number: 12/17/2018, 1:47 PM  Clinical Narrative:   Patient admitted with COPD. CM consult for Home Health. Patient has used [facility] in the past.  Vaughan Basta with [facility] took orders for PT/RN                Expected Discharge Plan: [place] Barriers to Discharge: Continued Medical Work up   Patient Goals and CMS Choice Patient states their goals for this hospitalization and ongoing recovery are:: to go back home. CMS Medicare.gov Compare Post Acute Care list provided to:: Patient    Expected Discharge Plan and Services Expected Discharge Plan: [place] Agency: [place] ([place]) Date Jane: 12/17/18 Time [place]: [place] Representative spoke with at [place]: Romualdo Bolk  Prior Living Arrangements/Services              Need for Family Participation in Patient Care: Yes (Comment) Care giver support system in place?: Yes (comment)   Criminal Activity/Legal Involvement Pertinent to Current Situation/Hospitalization: No - Comment as needed  Activities of Daily Living Home Assistive Devices/Equipment: Walker (specify type), Cane (specify quad or straight), Oxygen ADL Screening (condition at time of admission) Patient's cognitive ability adequate to safely complete daily activities?: Yes Is the patient deaf or have difficulty hearing?: Yes Does the patient have difficulty seeing, even when wearing glasses/contacts?: No Does the patient have difficulty concentrating, remembering, or making decisions?: No Patient able to express need for assistance with ADLs?: Yes Does the patient have difficulty dressing or bathing?: No Independently performs ADLs?: Yes (appropriate for developmental  age) Does the patient have difficulty walking or climbing stairs?: Yes Weakness of Legs: Both Weakness of Arms/Hands: None  Permission Sought/Granted   Permission granted to share information with : Yes, Verbal Permission Granted  Share Information with NAME: Romualdo Bolk  Permission granted to share info w AGENCY: [place]        Emotional Assessment     Affect (typically observed): Accepting   Alcohol / Substance Use: Not Applicable Psych Involvement: No (comment)  Admission diagnosis:  Shortness of breath [R06.02] Hypoxia [R09.02] COPD with acute exacerbation (HCC) [J44.1] COPD (chronic obstructive pulmonary disease) ([place]) [J44.9] Patient Active Problem List   Diagnosis Date Noted  . Acute on chronic respiratory failure with hypoxia ([place]) 04/24/2018  . Chronic stable angina ([place]) 04/24/2018  . Physical deconditioning 04/24/2018  . CAD (coronary artery disease) 02/03/2018  . Ischemic cardiomyopathy 11/25/2017  . Dizziness 04/01/2017  . COPD (chronic obstructive pulmonary disease) ([place]) 04/01/2017  . CAD S/P percutaneous coronary angioplasty 04/01/2017  . NSTEMI (non-ST elevated myocardial infarction) ([place]) 11/23/2016  . Hypertensive cardiovascular disease 10/03/2015  . Protein-calorie malnutrition, severe 07/13/2015  . Demand ischemia of myocardium ([place]) 07/13/2015  . Nonischemic cardiomyopathy ([place])   . Atypical chest pain   . Left bundle branch block   . COPD exacerbation ([place]) 07/11/2015  . Flu-like symptoms 07/11/2015  . Elevated troponin 07/11/2015  . Acute bronchitis 07/11/2015  . GERD (gastroesophageal reflux disease) 07/11/2015  . Bronchospasm   . Ascending aortic aneurysm ([place]) 01/27/2015  . Chronic combined systolic and diastolic CHF (congestive heart failure) ([place]) 01/27/2015  . Chest pain at rest 01/27/2015  .  Essential hypertension 01/27/2015  . Anxiety 01/27/2015  . Chronic pain 01/27/2015  . Chest pain 08/20/2013  . Hypertension  08/20/2013  . Contusion of left knee 08/07/2012  . Sprain of wrist 08/07/2012  . Liver mass 05/21/2011  . Abdominal pain 05/21/2011  . Bronchitis 05/21/2011  . De Quervain's disease (tenosynovitis) 04/02/2011  . SHOULDER PAIN 01/19/2009  . CERVICALGIA 01/19/2009  . IMPINGEMENT SYNDROME 01/19/2009   PCP:  Sinda Du, MD Pharmacy:   [facility] [address] [place] [address] Phone: [phone] Fax: [phone]      Readmission Risk Interventions No flowsheet data found.

## 2018-12-17 NOTE — Progress Notes (Signed)
Subjective: She was admitted yesterday with COPD exacerbation.  She is been complaining of chest pain.  She says she feels better but not well.  She does not have any other new complaints.  Objective: Vital signs in last 24 hours: Temp:  [98.1 F (36.7 C)-99 F (37.2 C)] 99 F (37.2 C) (08/05 0510) Pulse Rate:  [71-102] 71 (08/05 0510) Resp:  [18-23] 18 (08/05 0510) BP: (97-156)/(46-85) 126/46 (08/05 0510) SpO2:  [92 %-100 %] 92 % (08/05 0733) Weight:  [77 kg-77.6 kg] 77 kg (08/04 2009) Weight change:  Last BM Date: 12/15/18  Intake/Output from previous day: 08/04 0701 - 08/05 0700 In: -  Out: 700 [Urine:700]  PHYSICAL EXAM General appearance: She is awake and alert.  No acute distress.  Very anxious Resp: rhonchi bilaterally Cardio: regular rate and rhythm, S1, S2 normal, no murmur, click, rub or gallop GI: soft, non-tender; bowel sounds normal; no masses,  no organomegaly Extremities: extremities normal, atraumatic, no cyanosis or edema  Lab Results:  Results for orders placed or performed during the hospital encounter of 12/16/18 (from the past 48 hour(s))  SARS Coronavirus 2 [facility] order, Performed in [facility] hospital lab) Nasopharyngeal Nasopharyngeal Swab     Status: None   Collection Time: 12/16/18  9:15 AM   Specimen: Nasopharyngeal Swab  Result Value Ref Range   SARS Coronavirus 2 NEGATIVE NEGATIVE    Comment: (NOTE) If result is NEGATIVE SARS-CoV-2 target nucleic acids are NOT DETECTED. The SARS-CoV-2 RNA is generally detectable in upper and lower  respiratory specimens during the acute phase of infection. The lowest  concentration of SARS-CoV-2 viral copies this assay can detect is 250  copies / mL. A negative result does not preclude SARS-CoV-2 infection  and should not be used as the sole basis for treatment or other  patient management decisions.  A negative result may occur with  improper specimen collection / handling, submission of specimen other   than nasopharyngeal swab, presence of viral mutation(s) within the  areas targeted by this assay, and inadequate number of viral copies  (<250 copies / mL). A negative result must be combined with clinical  observations, patient history, and epidemiological information. If result is POSITIVE SARS-CoV-2 target nucleic acids are DETECTED. The SARS-CoV-2 RNA is generally detectable in upper and lower  respiratory specimens dur ing the acute phase of infection.  Positive  results are indicative of active infection with SARS-CoV-2.  Clinical  correlation with patient history and other diagnostic information is  necessary to determine patient infection status.  Positive results do  not rule out bacterial infection or co-infection with other viruses. If result is PRESUMPTIVE POSTIVE SARS-CoV-2 nucleic acids MAY BE PRESENT.   A presumptive positive result was obtained on the submitted specimen  and confirmed on repeat testing.  While 2019 novel coronavirus  (SARS-CoV-2) nucleic acids may be present in the submitted sample  additional confirmatory testing may be necessary for epidemiological  and / or clinical management purposes  to differentiate between  SARS-CoV-2 and other Sarbecovirus currently known to infect humans.  If clinically indicated additional testing with an alternate test  methodology [phone]) is advised. The SARS-CoV-2 RNA is generally  detectable in upper and lower respiratory sp ecimens during the acute  phase of infection. The expected result is Negative. Fact Sheet for Patients:  StrictlyIdeas.no Fact Sheet for Healthcare Providers: BankingDealers.co.za This test is not yet approved or cleared by the [place] FDA and has been authorized for detection and/or diagnosis of  SARS-CoV-2 by FDA under an Emergency Use Authorization (EUA).  This EUA will remain in effect (meaning this test can be used) for the duration of  the COVID-19 declaration under Section 564(b)(1) of the Act, 21 U.S.C. section 360bbb-3(b)(1), unless the authorization is terminated or revoked sooner. Performed at [facility], [address] [place], [place] [address]   Basic metabolic panel     Status: Abnormal   Collection Time: 12/16/18  9:16 AM  Result Value Ref Range   Sodium 142 135 - 145 mmol/L   Potassium 3.5 3.5 - 5.1 mmol/L   Chloride 103 98 - 111 mmol/L   CO2 27 22 - 32 mmol/L   Glucose, Bld 116 (H) 70 - 99 mg/dL   BUN 12 8 - 23 mg/dL   Creatinine, Ser 0.56 0.44 - 1.00 mg/dL   Calcium 9.7 8.9 - 10.3 mg/dL   GFR calc non Af Amer >60 >60 mL/min   GFR calc Af Amer >60 >60 mL/min   Anion gap 12 5 - 15    Comment: Performed at [facility], [address] [place], [place] [address]  Troponin I (High Sensitivity)     Status: None   Collection Time: 12/16/18  9:16 AM  Result Value Ref Range   Troponin I (High Sensitivity) 11 <18 ng/L    Comment: (NOTE) Elevated high sensitivity troponin I (hsTnI) values and significant  changes across serial measurements may suggest ACS but many other  chronic and acute conditions are known to elevate hsTnI results.  Refer to the "Links" section for chest pain algorithms and additional  guidance. Performed at [facility], [address] [place], [place] [address]   CBC with Differential     Status: Abnormal   Collection Time: 12/16/18  9:16 AM  Result Value Ref Range   WBC 8.3 4.0 - 10.5 K/uL   RBC 4.75 3.87 - 5.11 MIL/uL   Hemoglobin 12.5 12.0 - 15.0 g/dL   HCT 40.3 36.0 - 46.0 %   MCV 84.8 80.0 - 100.0 fL   MCH 26.3 26.0 - 34.0 pg   MCHC 31.0 30.0 - 36.0 g/dL   RDW 13.7 11.5 - 15.5 %   Platelets 248 150 - 400 K/uL   nRBC 0.0 0.0 - 0.2 %   Neutrophils Relative % 49 %   Neutro Abs 4.0 1.7 - 7.7 K/uL   Lymphocytes Relative 28 %   Lymphs Abs 2.3 0.7 - 4.0 K/uL   Monocytes Relative 10 %   Monocytes Absolute 0.8 0.1 - 1.0 K/uL   Eosinophils Relative 12 %   Eosinophils  Absolute 1.0 (H) 0.0 - 0.5 K/uL   Basophils Relative 1 %   Basophils Absolute 0.1 0.0 - 0.1 K/uL   Immature Granulocytes 0 %   Abs Immature Granulocytes 0.02 0.00 - 0.07 K/uL    Comment: Performed at [facility], [address], [place], [place] [address]  Brain natriuretic peptide     Status: Abnormal   Collection Time: 12/16/18  9:16 AM  Result Value Ref Range   B Natriuretic Peptide 203.0 (H) 0.0 - 100.0 pg/mL    Comment: Performed at [facility], [address] [place],  [address]  Troponin I (High Sensitivity)     Status: None   Collection Time: 12/16/18 12:30 PM  Result Value Ref Range   Troponin I (High Sensitivity) 8 <18 ng/L    Comment: (NOTE) Elevated high sensitivity troponin I (hsTnI) values and significant  changes across serial measurements may  suggest ACS but many other  chronic and acute conditions are known to elevate hsTnI results.  Refer to the "Links" section for chest pain algorithms and additional  guidance. Performed at [facility], [address] [place], [place] [address]   Basic metabolic panel     Status: Abnormal   Collection Time: 12/17/18  6:41 AM  Result Value Ref Range   Sodium 140 135 - 145 mmol/L   Potassium 4.0 3.5 - 5.1 mmol/L   Chloride 104 98 - 111 mmol/L   CO2 25 22 - 32 mmol/L   Glucose, Bld 114 (H) 70 - 99 mg/dL   BUN 15 8 - 23 mg/dL   Creatinine, Ser 0.59 0.44 - 1.00 mg/dL   Calcium 10.1 8.9 - 10.3 mg/dL   GFR calc non Af Amer >60 >60 mL/min   GFR calc Af Amer >60 >60 mL/min   Anion gap 11 5 - 15    Comment: Performed at [facility], [address] [place], [place] [address]  CBC     Status: Abnormal   Collection Time: 12/17/18  6:41 AM  Result Value Ref Range   WBC 9.6 4.0 - 10.5 K/uL   RBC 4.40 3.87 - 5.11 MIL/uL   Hemoglobin 11.6 (L) 12.0 - 15.0 g/dL   HCT 37.4 36.0 - 46.0 %   MCV 85.0 80.0 - 100.0 fL   MCH 26.4 26.0 - 34.0 pg   MCHC 31.0 30.0 - 36.0 g/dL   RDW 13.7 11.5 - 15.5 %   Platelets 270 150 - 400 K/uL    nRBC 0.0 0.0 - 0.2 %    Comment: Performed at [facility] [place], [address], [place], [place] [address]    ABGS No results for input(s): PHART, PO2ART, TCO2, HCO3 in the last 72 hours.  Invalid input(s): PCO2 CULTURES Recent Results (from the past 240 hour(s))  SARS Coronavirus 2 [facility] order, Performed in [facility] hospital lab) Nasopharyngeal Nasopharyngeal Swab     Status: None   Collection Time: 12/16/18  9:15 AM   Specimen: Nasopharyngeal Swab  Result Value Ref Range Status   SARS Coronavirus 2 NEGATIVE NEGATIVE Final    Comment: (NOTE) If result is NEGATIVE SARS-CoV-2 target nucleic acids are NOT DETECTED. The SARS-CoV-2 RNA is generally detectable in upper and lower  respiratory specimens during the acute phase of infection. The lowest  concentration of SARS-CoV-2 viral copies this assay can detect is 250  copies / mL. A negative result does not preclude SARS-CoV-2 infection  and should not be used as the sole basis for treatment or other  patient management decisions.  A negative result may occur with  improper specimen collection / handling, submission of specimen other  than nasopharyngeal swab, presence of viral mutation(s) within the  areas targeted by this assay, and inadequate number of viral copies  (<250 copies / mL). A negative result must be combined with clinical  observations, patient history, and epidemiological information. If result is POSITIVE SARS-CoV-2 target nucleic acids are DETECTED. The SARS-CoV-2 RNA is generally detectable in upper and lower  respiratory specimens dur ing the acute phase of infection.  Positive  results are indicative of active infection with SARS-CoV-2.  Clinical  correlation with patient history and other diagnostic information is  necessary to determine patient infection status.  Positive results do  not rule out bacterial infection or co-infection with other viruses. If result is PRESUMPTIVE POSTIVE SARS-CoV-2 nucleic  acids MAY BE PRESENT.   A presumptive positive result  was obtained on the submitted specimen  and confirmed on repeat testing.  While 2019 novel coronavirus  (SARS-CoV-2) nucleic acids may be present in the submitted sample  additional confirmatory testing may be necessary for epidemiological  and / or clinical management purposes  to differentiate between  SARS-CoV-2 and other Sarbecovirus currently known to infect humans.  If clinically indicated additional testing with an alternate test  methodology [phone]) is advised. The SARS-CoV-2 RNA is generally  detectable in upper and lower respiratory sp ecimens during the acute  phase of infection. The expected result is Negative. Fact Sheet for Patients:  StrictlyIdeas.no Fact Sheet for Healthcare Providers: BankingDealers.co.za This test is not yet approved or cleared by the [place] FDA and has been authorized for detection and/or diagnosis of SARS-CoV-2 by FDA under an Emergency Use Authorization (EUA).  This EUA will remain in effect (meaning this test can be used) for the duration of the COVID-19 declaration under Section 564(b)(1) of the Act, 21 U.S.C. section 360bbb-3(b)(1), unless the authorization is terminated or revoked sooner. Performed at [facility], [address] [place], [place] [address]    Studies/Results: Dg Chest Port 1 View  Result Date: 12/16/2018 CLINICAL DATA:  Shortness of breath for several weeks worse this morning, history COPD EXAM: PORTABLE CHEST 1 VIEW COMPARISON:  Portable exam 0923 hours compared to 10/29/2018 FINDINGS: Enlargement of cardiac silhouette. Mediastinal contours and pulmonary vascularity normal. Lungs clear. No pulmonary infiltrate, pleural effusion or pneumothorax. Bones demineralized with note of a LEFT shoulder prosthesis and RIGHT glenohumeral degenerative changes. IMPRESSION: Enlargement of cardiac silhouette. No acute abnormalities.  Electronically Signed   By: Lavonia Dana M.D.   On: 12/16/2018 09:39    Medications:  Prior to Admission:  Medications Prior to Admission  Medication Sig Dispense Refill Last Dose  . albuterol (PROVENTIL) (2.5 MG/3ML) 0.083% nebulizer solution Take 3 mLs (2.5 mg total) by nebulization every 6 (six) hours as needed for wheezing or shortness of breath. 75 mL 12 UNKNOWN  . aspirin EC 81 MG tablet Take 81 mg by mouth every morning.    12/15/2018 at Unknown time  . atorvastatin (LIPITOR) 80 MG tablet Take 1 tablet (80 mg total) by mouth daily at 6 PM. 30 tablet 6 12/15/2018 at Unknown time  . carvedilol (COREG) 6.25 MG tablet TAKE ONE TABLET BY MOUTH 2 TIMES A DAY. (Patient taking differently: Take 6.25 mg by mouth 2 (two) times daily with a meal. ) 180 tablet 3 12/15/2018 at 2000  . clorazepate (TRANXENE) 7.5 MG tablet Take 7.5 mg by mouth daily as needed for anxiety. For nerves   12/15/2018 at Unknown time  . ENTRESTO 24-26 MG TAKE 1 TABLET BY MOUTH TWICE DAILY. (Patient taking differently: Take 1 tablet by mouth 2 (two) times daily. ) 60 tablet 6 12/15/2018 at Unknown time  . meclizine (ANTIVERT) 25 MG tablet Take 1 or 2  Every 6 hours (Patient taking differently: Take 12.5-25 mg by mouth 4 (four) times daily as needed for dizziness. ) 100 tablet 3 Past Week at Unknown time  . nitroGLYCERIN (NITROSTAT) 0.4 MG SL tablet Place 1 tablet (0.4 mg total) under the tongue every 5 (five) minutes x 3 doses as needed for chest pain. 25 tablet 12 UNKNOWN  . OXYGEN Inhale 2-2.5 L into the lungs at bedtime.    12/15/2018 at Unknown time  . PROAIR HFA 108 (90 BASE) MCG/ACT inhaler Inhale 2 puffs into the lungs every 6 (six) hours as needed for wheezing or shortness  of breath.    12/16/2018 at Unknown time  . traMADol (ULTRAM) 50 MG tablet Take 50 mg by mouth daily as needed for moderate pain or severe pain. Maximum dose= 8 tablets per day   UNKNOWN   Scheduled: . aspirin EC  81 mg Oral Daily  . atorvastatin  80 mg Oral q1800   . carvedilol  6.25 mg Oral BID WC  . guaiFENesin  600 mg Oral BID  . heparin  5,000 Units Subcutaneous Q8H  . ipratropium-albuterol  3 mL Nebulization TID  . methylPREDNISolone (SOLU-MEDROL) injection  40 mg Intravenous Q12H  . mometasone-formoterol  2 puff Inhalation BID   Continuous:  [id number] **OR** acetaminophen, albuterol, meclizine, nitroGLYCERIN, traMADol  Assesment: She was admitted with COPD exacerbation and acute on chronic hypoxic respiratory failure.  She is improving.  She has chronic combined systolic and diastolic heart failure and has not had echocardiogram in 2 years and considering her chest pain and other symptoms I am going to have her go ahead and get an echo today  She has coronary disease and had chest pain but troponins are negative  She has severe anxiety Active Problems:   Hypertension   Chronic combined systolic and diastolic CHF (congestive heart failure) (HCC)   COPD (chronic obstructive pulmonary disease) (HCC)   CAD (coronary artery disease)   Acute on chronic respiratory failure with hypoxia ([place])    Plan: For echocardiogram.  Continue IV steroids.  Continue inhaled bronchodilators.  Full admission    LOS: 0 days   [name] 12/17/2018, 8:36 AM

## 2018-12-18 NOTE — Progress Notes (Signed)
Subjective: She says she feels better but not as well as she would like.  She says she does not understand why she is not well yet and I explained to her the typical course of COPD exacerbation.  She says she still feels tight in her chest.  She still feels like this is more from her heart than her lungs.  She has ruled out for acute coronary syndrome and her echocardiogram is basically unchanged  Objective: Vital signs in last 24 hours: Temp:  [98.4 F (36.9 C)-99.1 F (37.3 C)] 99.1 F (37.3 C) (08/06 0500) Pulse Rate:  [66-76] 66 (08/06 0500) Resp:  [18-20] 18 (08/06 0500) BP: (119-130)/(55-69) 130/69 (08/06 0500) SpO2:  [93 %-98 %] 98 % (08/06 0713) Weight change:  Last BM Date: 12/15/18  Intake/Output from previous day: 08/05 0701 - 08/06 0700 In: 960 [P.O.:960] Out: 1300 [Urine:1300]  PHYSICAL EXAM General appearance: alert, cooperative and Very anxious Resp: rhonchi bilaterally Cardio: regular rate and rhythm, S1, S2 normal, no murmur, click, rub or gallop GI: soft, non-tender; bowel sounds normal; no masses,  no organomegaly Extremities: extremities normal, atraumatic, no cyanosis or edema  Lab Results:  Results for orders placed or performed during the hospital encounter of 12/16/18 (from the past 48 hour(s))  SARS Coronavirus 2 [facility] order, Performed in [facility] hospital lab) Nasopharyngeal Nasopharyngeal Swab     Status: None   Collection Time: 12/16/18  9:15 AM   Specimen: Nasopharyngeal Swab  Result Value Ref Range   SARS Coronavirus 2 NEGATIVE NEGATIVE    Comment: (NOTE) If result is NEGATIVE SARS-CoV-2 target nucleic acids are NOT DETECTED. The SARS-CoV-2 RNA is generally detectable in upper and lower  respiratory specimens during the acute phase of infection. The lowest  concentration of SARS-CoV-2 viral copies this assay can detect is 250  copies / mL. A negative result does not preclude SARS-CoV-2 infection  and should not be used as the sole basis  for treatment or other  patient management decisions.  A negative result may occur with  improper specimen collection / handling, submission of specimen other  than nasopharyngeal swab, presence of viral mutation(s) within the  areas targeted by this assay, and inadequate number of viral copies  (<250 copies / mL). A negative result must be combined with clinical  observations, patient history, and epidemiological information. If result is POSITIVE SARS-CoV-2 target nucleic acids are DETECTED. The SARS-CoV-2 RNA is generally detectable in upper and lower  respiratory specimens dur ing the acute phase of infection.  Positive  results are indicative of active infection with SARS-CoV-2.  Clinical  correlation with patient history and other diagnostic information is  necessary to determine patient infection status.  Positive results do  not rule out bacterial infection or co-infection with other viruses. If result is PRESUMPTIVE POSTIVE SARS-CoV-2 nucleic acids MAY BE PRESENT.   A presumptive positive result was obtained on the submitted specimen  and confirmed on repeat testing.  While 2019 novel coronavirus  (SARS-CoV-2) nucleic acids may be present in the submitted sample  additional confirmatory testing may be necessary for epidemiological  and / or clinical management purposes  to differentiate between  SARS-CoV-2 and other Sarbecovirus currently known to infect humans.  If clinically indicated additional testing with an alternate test  methodology [phone]) is advised. The SARS-CoV-2 RNA is generally  detectable in upper and lower respiratory sp ecimens during the acute  phase of infection. The expected result is Negative. Fact Sheet for Patients:  StrictlyIdeas.no Fact  Sheet for Healthcare Providers: BankingDealers.co.za This test is not yet approved or cleared by the [place] and has been authorized for detection and/or  diagnosis of SARS-CoV-2 by FDA under an Emergency Use Authorization (EUA).  This EUA will remain in effect (meaning this test can be used) for the duration of the COVID-19 declaration under Section 564(b)(1) of the Act, 21 U.S.C. section 360bbb-3(b)(1), unless the authorization is terminated or revoked sooner. Performed at [facility], [address], [place], [place] [address]   Basic metabolic panel     Status: Abnormal   Collection Time: 12/16/18  9:16 AM  Result Value Ref Range   Sodium 142 135 - 145 mmol/L   Potassium 3.5 3.5 - 5.1 mmol/L   Chloride 103 98 - 111 mmol/L   CO2 27 22 - 32 mmol/L   Glucose, Bld 116 (H) 70 - 99 mg/dL   BUN 12 8 - 23 mg/dL   Creatinine, Ser 0.56 0.44 - 1.00 mg/dL   Calcium 9.7 8.9 - 10.3 mg/dL   GFR calc non Af Amer >60 >60 mL/min   GFR calc Af Amer >60 >60 mL/min   Anion gap 12 5 - 15    Comment: Performed at [facility], [address] [place], [place] [address]  Troponin I (High Sensitivity)     Status: None   Collection Time: 12/16/18  9:16 AM  Result Value Ref Range   Troponin I (High Sensitivity) 11 <18 ng/L    Comment: (NOTE) Elevated high sensitivity troponin I (hsTnI) values and significant  changes across serial measurements may suggest ACS but many other  chronic and acute conditions are known to elevate hsTnI results.  Refer to the "Links" section for chest pain algorithms and additional  guidance. Performed at [facility], [address], [place], [place] [address]   CBC with Differential     Status: Abnormal   Collection Time: 12/16/18  9:16 AM  Result Value Ref Range   WBC 8.3 4.0 - 10.5 K/uL   RBC 4.75 3.87 - 5.11 MIL/uL   Hemoglobin 12.5 12.0 - 15.0 g/dL   HCT 40.3 36.0 - 46.0 %   MCV 84.8 80.0 - 100.0 fL   MCH 26.3 26.0 - 34.0 pg   MCHC 31.0 30.0 - 36.0 g/dL   RDW 13.7 11.5 - 15.5 %   Platelets 248 150 - 400 K/uL   nRBC 0.0 0.0 - 0.2 %   Neutrophils Relative % 49 %   Neutro Abs 4.0 1.7 - 7.7 K/uL   Lymphocytes  Relative 28 %   Lymphs Abs 2.3 0.7 - 4.0 K/uL   Monocytes Relative 10 %   Monocytes Absolute 0.8 0.1 - 1.0 K/uL   Eosinophils Relative 12 %   Eosinophils Absolute 1.0 (H) 0.0 - 0.5 K/uL   Basophils Relative 1 %   Basophils Absolute 0.1 0.0 - 0.1 K/uL   Immature Granulocytes 0 %   Abs Immature Granulocytes 0.02 0.00 - 0.07 K/uL    Comment: Performed at [facility], [address] [place], [place] [address]  Brain natriuretic peptide     Status: Abnormal   Collection Time: 12/16/18  9:16 AM  Result Value Ref Range   B Natriuretic Peptide 203.0 (H) 0.0 - 100.0 pg/mL    Comment: Performed at [facility], [address], [place], [place] [address]  Troponin I (High Sensitivity)     Status: None   Collection Time: 12/16/18 12:30 PM  Result Value Ref Range  Troponin I (High Sensitivity) 8 <18 ng/L    Comment: (NOTE) Elevated high sensitivity troponin I (hsTnI) values and significant  changes across serial measurements may suggest ACS but many other  chronic and acute conditions are known to elevate hsTnI results.  Refer to the "Links" section for chest pain algorithms and additional  guidance. Performed at [facility], [address] [place], [place] [address]   Basic metabolic panel     Status: Abnormal   Collection Time: 12/17/18  6:41 AM  Result Value Ref Range   Sodium 140 135 - 145 mmol/L   Potassium 4.0 3.5 - 5.1 mmol/L   Chloride 104 98 - 111 mmol/L   CO2 25 22 - 32 mmol/L   Glucose, Bld 114 (H) 70 - 99 mg/dL   BUN 15 8 - 23 mg/dL   Creatinine, Ser 0.59 0.44 - 1.00 mg/dL   Calcium 10.1 8.9 - 10.3 mg/dL   GFR calc non Af Amer >60 >60 mL/min   GFR calc Af Amer >60 >60 mL/min   Anion gap 11 5 - 15    Comment: Performed at [facility], [address] [place], [place] [address]  CBC     Status: Abnormal   Collection Time: 12/17/18  6:41 AM  Result Value Ref Range   WBC 9.6 4.0 - 10.5 K/uL   RBC 4.40 3.87 - 5.11 MIL/uL   Hemoglobin 11.6 (L) 12.0 - 15.0 g/dL   HCT 37.4  36.0 - 46.0 %   MCV 85.0 80.0 - 100.0 fL   MCH 26.4 26.0 - 34.0 pg   MCHC 31.0 30.0 - 36.0 g/dL   RDW 13.7 11.5 - 15.5 %   Platelets 270 150 - 400 K/uL   nRBC 0.0 0.0 - 0.2 %    Comment: Performed at [facility], [address], [place], [place] [address]    ABGS No results for input(s): PHART, PO2ART, TCO2, HCO3 in the last 72 hours.  Invalid input(s): PCO2 CULTURES Recent Results (from the past 240 hour(s))  SARS Coronavirus 2 [facility] order, Performed in [facility] hospital lab) Nasopharyngeal Nasopharyngeal Swab     Status: None   Collection Time: 12/16/18  9:15 AM   Specimen: Nasopharyngeal Swab  Result Value Ref Range Status   SARS Coronavirus 2 NEGATIVE NEGATIVE Final    Comment: (NOTE) If result is NEGATIVE SARS-CoV-2 target nucleic acids are NOT DETECTED. The SARS-CoV-2 RNA is generally detectable in upper and lower  respiratory specimens during the acute phase of infection. The lowest  concentration of SARS-CoV-2 viral copies this assay can detect is 250  copies / mL. A negative result does not preclude SARS-CoV-2 infection  and should not be used as the sole basis for treatment or other  patient management decisions.  A negative result may occur with  improper specimen collection / handling, submission of specimen other  than nasopharyngeal swab, presence of viral mutation(s) within the  areas targeted by this assay, and inadequate number of viral copies  (<250 copies / mL). A negative result must be combined with clinical  observations, patient history, and epidemiological information. If result is POSITIVE SARS-CoV-2 target nucleic acids are DETECTED. The SARS-CoV-2 RNA is generally detectable in upper and lower  respiratory specimens dur ing the acute phase of infection.  Positive  results are indicative of active infection with SARS-CoV-2.  Clinical  correlation with patient history and other diagnostic information is  necessary to determine patient  infection status.  Positive results do  not rule out bacterial infection or co-infection with other viruses. If result is PRESUMPTIVE POSTIVE SARS-CoV-2 nucleic acids MAY BE PRESENT.   A presumptive positive result was obtained on the submitted specimen  and confirmed on repeat testing.  While 2019 novel coronavirus  (SARS-CoV-2) nucleic acids may be present in the submitted sample  additional confirmatory testing may be necessary for epidemiological  and / or clinical management purposes  to differentiate between  SARS-CoV-2 and other Sarbecovirus currently known to infect humans.  If clinically indicated additional testing with an alternate test  methodology [phone]) is advised. The SARS-CoV-2 RNA is generally  detectable in upper and lower respiratory sp ecimens during the acute  phase of infection. The expected result is Negative. Fact Sheet for Patients:  StrictlyIdeas.no Fact Sheet for Healthcare Providers: BankingDealers.co.za This test is not yet approved or cleared by the [place] FDA and has been authorized for detection and/or diagnosis of SARS-CoV-2 by FDA under an Emergency Use Authorization (EUA).  This EUA will remain in effect (meaning this test can be used) for the duration of the COVID-19 declaration under Section 564(b)(1) of the Act, 21 U.S.C. section 360bbb-3(b)(1), unless the authorization is terminated or revoked sooner. Performed at [facility], [address], [place], [place] [address]    Studies/Results: Dg Chest Port 1 View  Result Date: 12/16/2018 CLINICAL DATA:  Shortness of breath for several weeks worse this morning, history COPD EXAM: PORTABLE CHEST 1 VIEW COMPARISON:  Portable exam 0923 hours compared to 10/29/2018 FINDINGS: Enlargement of cardiac silhouette. Mediastinal contours and pulmonary vascularity normal. Lungs clear. No pulmonary infiltrate, pleural effusion or pneumothorax. Bones  demineralized with note of a LEFT shoulder prosthesis and RIGHT glenohumeral degenerative changes. IMPRESSION: Enlargement of cardiac silhouette. No acute abnormalities. Electronically Signed   By: Lavonia Dana M.D.   On: 12/16/2018 09:39    Medications:  Prior to Admission:  Medications Prior to Admission  Medication Sig Dispense Refill Last Dose  . albuterol (PROVENTIL) (2.5 MG/3ML) 0.083% nebulizer solution Take 3 mLs (2.5 mg total) by nebulization every 6 (six) hours as needed for wheezing or shortness of breath. 75 mL 12 UNKNOWN  . aspirin EC 81 MG tablet Take 81 mg by mouth every morning.    12/15/2018 at Unknown time  . atorvastatin (LIPITOR) 80 MG tablet Take 1 tablet (80 mg total) by mouth daily at 6 PM. 30 tablet 6 12/15/2018 at Unknown time  . carvedilol (COREG) 6.25 MG tablet TAKE ONE TABLET BY MOUTH 2 TIMES A DAY. (Patient taking differently: Take 6.25 mg by mouth 2 (two) times daily with a meal. ) 180 tablet 3 12/15/2018 at 2000  . clorazepate (TRANXENE) 7.5 MG tablet Take 7.5 mg by mouth daily as needed for anxiety. For nerves   12/15/2018 at Unknown time  . ENTRESTO 24-26 MG TAKE 1 TABLET BY MOUTH TWICE DAILY. (Patient taking differently: Take 1 tablet by mouth 2 (two) times daily. ) 60 tablet 6 12/15/2018 at Unknown time  . meclizine (ANTIVERT) 25 MG tablet Take 1 or 2  Every 6 hours (Patient taking differently: Take 12.5-25 mg by mouth 4 (four) times daily as needed for dizziness. ) 100 tablet 3 Past Week at Unknown time  . nitroGLYCERIN (NITROSTAT) 0.4 MG SL tablet Place 1 tablet (0.4 mg total) under the tongue every 5 (five) minutes x 3 doses as needed for chest pain. 25 tablet 12 UNKNOWN  . OXYGEN Inhale 2-2.5 L into the lungs at bedtime.    12/15/2018 at  Unknown time  . PROAIR HFA 108 (90 BASE) MCG/ACT inhaler Inhale 2 puffs into the lungs every 6 (six) hours as needed for wheezing or shortness of breath.    12/16/2018 at Unknown time  . traMADol (ULTRAM) 50 MG tablet Take 50 mg by mouth  daily as needed for moderate pain or severe pain. Maximum dose= 8 tablets per day   UNKNOWN   Scheduled: . aspirin EC  81 mg Oral Daily  . atorvastatin  80 mg Oral q1800  . carvedilol  6.25 mg Oral BID WC  . guaiFENesin  600 mg Oral BID  . heparin  5,000 Units Subcutaneous Q8H  . ipratropium-albuterol  3 mL Nebulization TID  . methylPREDNISolone (SOLU-MEDROL) injection  40 mg Intravenous Q12H  . mometasone-formoterol  2 puff Inhalation BID   Continuous:  TIR:[id number] **OR** acetaminophen, albuterol, meclizine, nitroGLYCERIN, traMADol  Assesment: She was admitted with COPD exacerbation.  She is getting better but more slowly than she would like.  She has acute on chronic hypoxic respiratory failure and is still needing oxygen.  She has chronic combined systolic and diastolic heart failure but that does not appear to be what is causing her problem now  She has coronary disease but has ruled out for acute coronary syndrome Active Problems:   Hypertension   Chronic combined systolic and diastolic CHF (congestive heart failure) (HCC)   COPD exacerbation (HCC)   COPD (chronic obstructive pulmonary disease) (HCC)   CAD (coronary artery disease)   Acute on chronic respiratory failure with hypoxia ([place])    Plan: Continue current treatment    LOS: 1 day   Alonza Bogus 12/18/2018, 8:59 AM

## 2018-12-18 NOTE — Progress Notes (Signed)
Tele called and and svt run in 140's.   Dr. Luan Pulling notified and he gave no new orders.  Patient has been alert and oriented and ambulating independently in hallway.

## 2018-12-19 LAB — LIPID PANEL
Cholesterol: 171 mg/dL (ref 0–200)
HDL: 79 mg/dL (ref >40)
LDL Cholesterol: 81 mg/dL (ref 0–99)
Total CHOL/HDL Ratio: 2.2 RATIO
Triglycerides: 53 mg/dL (ref <150)
VLDL: 11 mg/dL (ref 0–40)

## 2018-12-19 MED ORDER — SYMBICORT 160-4.5 MCG/ACT IN AERO: 2.0000 | INHALATION_SPRAY | Freq: Two times a day (BID) | RESPIRATORY_TRACT | 12 refills | Status: DC

## 2018-12-19 MED ORDER — PREDNISONE 10 MG (21) PO TBPK: ORAL_TABLET | ORAL | 1 refills | Status: DC

## 2018-12-19 NOTE — Discharge Summary (Signed)
Physician Discharge Summary  Patient ID: [name] MRN: [id number] DOB/AGE: [birth year] 81 y.o. Primary Care Physician:Yarah Fuente, Percell Miller, MD Admit date: 12/16/2018 Discharge date: 12/19/2018    Discharge Diagnoses:   Active Problems:   Hypertension   Chronic combined systolic and diastolic CHF (congestive heart failure) (HCC)   COPD exacerbation (HCC)   COPD (chronic obstructive pulmonary disease) (HCC)   CAD (coronary artery disease)   Acute on chronic respiratory failure with hypoxia (HCC)   Allergies as of 12/19/2018      Reactions   Plavix [clopidogrel Bisulfate] Itching   Severe itching   Montelukast Sodium    Pt states she felt a "headache and her eyes started to Burn "    Alprazolam Nausea And Vomiting   Codeine Nausea And Vomiting   Percodan [oxycodone-aspirin] Nausea And Vomiting   Valium Nausea And Vomiting      Medication List    TAKE these medications   aspirin EC 81 MG tablet Take 81 mg by mouth every morning.   atorvastatin 80 MG tablet Commonly known as: LIPITOR Take 1 tablet (80 mg total) by mouth daily at 6 PM.   carvedilol 6.25 MG tablet Commonly known as: COREG TAKE ONE TABLET BY MOUTH 2 TIMES A DAY. What changed: See the new instructions.   clorazepate 7.5 MG tablet Commonly known as: TRANXENE Take 7.5 mg by mouth daily as needed for anxiety. For nerves   Entresto 24-26 MG Generic drug: sacubitril-valsartan TAKE 1 TABLET BY MOUTH TWICE DAILY.   meclizine 25 MG tablet Commonly known as: ANTIVERT Take 1 or 2  Every 6 hours What changed:   how much to take  how to take this  when to take this  reasons to take this  additional instructions   nitroGLYCERIN 0.4 MG SL tablet Commonly known as: NITROSTAT Place 1 tablet (0.4 mg total) under the tongue every 5 (five) minutes x 3 doses as needed for chest pain.   OXYGEN Inhale 2-2.5 L into the lungs at bedtime.   predniSONE 10 MG (21) Tbpk tablet Commonly known as: STERAPRED UNI-PAK 21  TAB Take by package instructions   ProAir HFA 108 (90 Base) MCG/ACT inhaler Generic drug: albuterol Inhale 2 puffs into the lungs every 6 (six) hours as needed for wheezing or shortness of breath.   albuterol (2.5 MG/3ML) 0.083% nebulizer solution Commonly known as: PROVENTIL Take 3 mLs (2.5 mg total) by nebulization every 6 (six) hours as needed for wheezing or shortness of breath.   Symbicort 160-4.5 MCG/ACT inhaler Generic drug: budesonide-formoterol Inhale 2 puffs into the lungs 2 (two) times daily.   traMADol 50 MG tablet Commonly known as: ULTRAM Take 50 mg by mouth daily as needed for moderate pain or severe pain. Maximum dose= 8 tablets per day       Discharged Condition: Improved    Consults: None  Significant Diagnostic Studies: Dg Chest Port 1 View  Result Date: 12/16/2018 CLINICAL DATA:  Shortness of breath for several weeks worse this morning, history COPD EXAM: PORTABLE CHEST 1 VIEW COMPARISON:  Portable exam 0923 hours compared to 10/29/2018 FINDINGS: Enlargement of cardiac silhouette. Mediastinal contours and pulmonary vascularity normal. Lungs clear. No pulmonary infiltrate, pleural effusion or pneumothorax. Bones demineralized with note of a LEFT shoulder prosthesis and RIGHT glenohumeral degenerative changes. IMPRESSION: Enlargement of cardiac silhouette. No acute abnormalities. Electronically Signed   By: Lavonia Dana M.D.   On: 12/16/2018 09:39    Lab Results: Basic Metabolic Panel: Recent Labs  12/16/18 0916 12/17/18 0641  NA 142 140  K 3.5 4.0  CL 103 104  CO2 27 25  GLUCOSE 116* 114*  BUN 12 15  CREATININE 0.56 0.59  CALCIUM 9.7 10.1   Liver Function Tests: No results for input(s): AST, ALT, ALKPHOS, BILITOT, PROT, ALBUMIN in the last 72 hours.   CBC: Recent Labs    12/16/18 0916 12/17/18 0641  WBC 8.3 9.6  NEUTROABS 4.0  --   HGB 12.5 11.6*  HCT 40.3 37.4  MCV 84.8 85.0  PLT 248 270    Recent Results (from the past 240  hour(s))  SARS Coronavirus 2 [facility] order, Performed in [facility] hospital lab) Nasopharyngeal Nasopharyngeal Swab     Status: None   Collection Time: 12/16/18  9:15 AM   Specimen: Nasopharyngeal Swab  Result Value Ref Range Status   SARS Coronavirus 2 NEGATIVE NEGATIVE Final    Comment: (NOTE) If result is NEGATIVE SARS-CoV-2 target nucleic acids are NOT DETECTED. The SARS-CoV-2 RNA is generally detectable in upper and lower  respiratory specimens during the acute phase of infection. The lowest  concentration of SARS-CoV-2 viral copies this assay can detect is 250  copies / mL. A negative result does not preclude SARS-CoV-2 infection  and should not be used as the sole basis for treatment or other  patient management decisions.  A negative result may occur with  improper specimen collection / handling, submission of specimen other  than nasopharyngeal swab, presence of viral mutation(s) within the  areas targeted by this assay, and inadequate number of viral copies  (<250 copies / mL). A negative result must be combined with clinical  observations, patient history, and epidemiological information. If result is POSITIVE SARS-CoV-2 target nucleic acids are DETECTED. The SARS-CoV-2 RNA is generally detectable in upper and lower  respiratory specimens dur ing the acute phase of infection.  Positive  results are indicative of active infection with SARS-CoV-2.  Clinical  correlation with patient history and other diagnostic information is  necessary to determine patient infection status.  Positive results do  not rule out bacterial infection or co-infection with other viruses. If result is PRESUMPTIVE POSTIVE SARS-CoV-2 nucleic acids MAY BE PRESENT.   A presumptive positive result was obtained on the submitted specimen  and confirmed on repeat testing.  While 2019 novel coronavirus  (SARS-CoV-2) nucleic acids may be present in the submitted sample  additional confirmatory testing  may be necessary for epidemiological  and / or clinical management purposes  to differentiate between  SARS-CoV-2 and other Sarbecovirus currently known to infect humans.  If clinically indicated additional testing with an alternate test  methodology [phone]) is advised. The SARS-CoV-2 RNA is generally  detectable in upper and lower respiratory sp ecimens during the acute  phase of infection. The expected result is Negative. Fact Sheet for Patients:  StrictlyIdeas.no Fact Sheet for Healthcare Providers: BankingDealers.co.za This test is not yet approved or cleared by the [place] FDA and has been authorized for detection and/or diagnosis of SARS-CoV-2 by FDA under an Emergency Use Authorization (EUA).  This EUA will remain in effect (meaning this test can be used) for the duration of the COVID-19 declaration under Section 564(b)(1) of the Act, 21 U.S.C. section 360bbb-3(b)(1), unless the authorization is terminated or revoked sooner. Performed at [facility], [address], [place], [place] [address]      Hospital Course: This is an 83 year old who has multiple medical problems and who came to the emergency department because of  increasing shortness of breath.  She had COPD exacerbation and she was treated for that.  She improved over the next several days to the point that she was able to be discharged home.  She has coronary disease and ruled out for any sort of acute coronary syndrome.  She has heart failure but her echocardiogram was basically unchanged.  She improved over the next several days to the point that she was ready for discharge.  Discharge Exam: Blood pressure (!) 147/92, pulse 62, temperature 98.1 F (36.7 C), temperature source Oral, resp. rate 18, height 5\' 2"  (1.575 m), weight 77 kg, SpO2 95 %. Her chest is clear.  Heart is regular.  She is anxious.  Disposition: Home with home health services  Discharge  Instructions    Face-to-face encounter (required for Medicare/Medicaid patients)   Complete by: As directed    I Alonza Bogus certify that this patient is under my care and that I, or a nurse practitioner or physician's assistant working with me, had a face-to-face encounter that meets the physician face-to-face encounter requirements with this patient on 12/19/2018. The encounter with the patient was in whole, or in part for the following medical condition(s) which is the primary reason for home health care (List medical condition): copd exacerbation   The encounter with the patient was in whole, or in part, for the following medical condition, which is the primary reason for home health care: copd exacerbation   I certify that, based on my findings, the following services are medically necessary home health services:  Nursing Physical therapy     Reason for Medically Necessary Home Health Services: Skilled Nursing- Change/Decline in Patient Status   My clinical findings support the need for the above services: Shortness of breath with activity   Further, I certify that my clinical findings support that this patient is homebound due to: Shortness of Breath with activity   Home Health   Complete by: As directed    To provide the following care/treatments:  PT RN Home Health Aide          Signed: Alonza Bogus   12/19/2018, 9:10 AM

## 2018-12-19 NOTE — Progress Notes (Signed)
Has been ambulating independently in hallway on room air.  IVs removed and discharge instructions reviewed.  Script sent to pharmacy.  Daughter to give ride home

## 2018-12-19 NOTE — TOC Transition Note (Signed)
Transition of Care [facility]) - CM/SW Discharge Note   Patient Details  Name: [name] MRN: [id number] Date of Birth: December 11, 1935  Transition of Care [facility]) CM/SW Contact:  Ihor Gully, LCSW Phone Number: 12/19/2018, 10:30 AM   Clinical Narrative:    Vaughan Basta with [facility] notified of discharge and orders for PT, RN, aide.  LCSW signing off.     Final next level of care: [place] Barriers to Discharge: Continued Medical Work up   Patient Goals and CMS Choice Patient states their goals for this hospitalization and ongoing recovery are:: to go back home. CMS Medicare.gov Compare Post Acute Care list provided to:: Patient    Discharge Placement                       Discharge Plan and Services                            [place]: [place] ([place]) Date [place]: 12/17/18 Time [place]: [id number] Representative spoke with at [place]: [place] ([place]) Interventions     Readmission Risk Interventions No flowsheet data found.

## 2018-12-19 NOTE — Progress Notes (Signed)
She says she feels much better and wants to go home.  No new complaints.  Echocardiogram did not show much change from previously.  She still has very limited understanding of her situation.  Her chest is clear today she is moving air better and I think she is ready for discharge.

## 2018-12-20 DIAGNOSIS — I110 Hypertensive heart disease with heart failure: Secondary | ICD-10-CM | POA: Diagnosis not present

## 2018-12-20 DIAGNOSIS — I2510 Atherosclerotic heart disease of native coronary artery without angina pectoris: Secondary | ICD-10-CM | POA: Diagnosis not present

## 2018-12-20 DIAGNOSIS — Z9981 Dependence on supplemental oxygen: Secondary | ICD-10-CM | POA: Diagnosis not present

## 2018-12-20 DIAGNOSIS — Z7982 Long term (current) use of aspirin: Secondary | ICD-10-CM | POA: Diagnosis not present

## 2018-12-20 DIAGNOSIS — J9621 Acute and chronic respiratory failure with hypoxia: Secondary | ICD-10-CM | POA: Diagnosis not present

## 2018-12-20 DIAGNOSIS — J441 Chronic obstructive pulmonary disease with (acute) exacerbation: Secondary | ICD-10-CM | POA: Diagnosis not present

## 2018-12-20 DIAGNOSIS — I5042 Chronic combined systolic (congestive) and diastolic (congestive) heart failure: Secondary | ICD-10-CM | POA: Diagnosis not present

## 2018-12-22 ENCOUNTER — Other Ambulatory Visit: Payer: Self-pay

## 2018-12-22 DIAGNOSIS — I110 Hypertensive heart disease with heart failure: Secondary | ICD-10-CM | POA: Diagnosis not present

## 2018-12-22 DIAGNOSIS — J9621 Acute and chronic respiratory failure with hypoxia: Secondary | ICD-10-CM | POA: Diagnosis not present

## 2018-12-22 DIAGNOSIS — J441 Chronic obstructive pulmonary disease with (acute) exacerbation: Secondary | ICD-10-CM | POA: Diagnosis not present

## 2018-12-22 DIAGNOSIS — Z9981 Dependence on supplemental oxygen: Secondary | ICD-10-CM | POA: Diagnosis not present

## 2018-12-22 DIAGNOSIS — I2510 Atherosclerotic heart disease of native coronary artery without angina pectoris: Secondary | ICD-10-CM | POA: Diagnosis not present

## 2018-12-22 DIAGNOSIS — I5042 Chronic combined systolic (congestive) and diastolic (congestive) heart failure: Secondary | ICD-10-CM | POA: Diagnosis not present

## 2018-12-22 NOTE — Patient Outreach (Signed)
[facility]) Care Management  12/22/2018  [name] 1936-03-02 [id number]  EMMI: general discharge red alert Referral date: 12/22/18 Referral reason: Got discharge papers: No Insurance: Medicare Day # 1   Telephone call to patient regarding EMMI general discharge red alert. HIPAA verified with patient. RNCM introduced herself and explained reason for call. Patient states she received her paperwork at discharge.  Patient states she has a follow up visit scheduled with her cardiologist on 01/13/19. Patient states she has transportation to her appointments. Patient states she does not have a follow up scheduled with her primary MD.   RNCM advised patient to contact her primary MD office today to notify them of her hospitalization and discharge.  Patient verbalized understanding and agreement. Patient reports [facility] started services with her this weekend. Patient reports she has her medications and takes them as prescribed.  She reports using her oxygen as needed during the day and wears continuously at night.  Patient states she does not feel the best but denies any new symptoms.  Patient states she is debating on whether to have someone live with her to assist with her care or go to assisted living. Patient states she has questions regarding assisted living.  RNCM discussed and offered [facility] care management services. Patient verbally agreed to follow up with social worker to discuss assisted living.  RNCM advised patient to notify MD of any changes in condition prior to scheduled appointment. RNCM provided contact name and number: [phone] or main office number [phone] and 24 hour nurse advise line [phone].  RNCM verified patient aware of 911 services for urgent/ emergent needs.   PLAN: RNCM will refer patient to [facility] care management social worker   Quinn Plowman RN,BSN,CCM [facility] Telephonic  [phone]

## 2018-12-23 DIAGNOSIS — J441 Chronic obstructive pulmonary disease with (acute) exacerbation: Secondary | ICD-10-CM | POA: Diagnosis not present

## 2018-12-23 DIAGNOSIS — I5042 Chronic combined systolic (congestive) and diastolic (congestive) heart failure: Secondary | ICD-10-CM | POA: Diagnosis not present

## 2018-12-23 DIAGNOSIS — I2510 Atherosclerotic heart disease of native coronary artery without angina pectoris: Secondary | ICD-10-CM | POA: Diagnosis not present

## 2018-12-23 DIAGNOSIS — J9621 Acute and chronic respiratory failure with hypoxia: Secondary | ICD-10-CM | POA: Diagnosis not present

## 2018-12-23 DIAGNOSIS — I110 Hypertensive heart disease with heart failure: Secondary | ICD-10-CM | POA: Diagnosis not present

## 2018-12-23 DIAGNOSIS — Z9981 Dependence on supplemental oxygen: Secondary | ICD-10-CM | POA: Diagnosis not present

## 2018-12-24 DIAGNOSIS — Z9981 Dependence on supplemental oxygen: Secondary | ICD-10-CM | POA: Diagnosis not present

## 2018-12-24 DIAGNOSIS — I5042 Chronic combined systolic (congestive) and diastolic (congestive) heart failure: Secondary | ICD-10-CM | POA: Diagnosis not present

## 2018-12-24 DIAGNOSIS — J9621 Acute and chronic respiratory failure with hypoxia: Secondary | ICD-10-CM | POA: Diagnosis not present

## 2018-12-24 DIAGNOSIS — I2510 Atherosclerotic heart disease of native coronary artery without angina pectoris: Secondary | ICD-10-CM | POA: Diagnosis not present

## 2018-12-24 DIAGNOSIS — J441 Chronic obstructive pulmonary disease with (acute) exacerbation: Secondary | ICD-10-CM | POA: Diagnosis not present

## 2018-12-24 DIAGNOSIS — I110 Hypertensive heart disease with heart failure: Secondary | ICD-10-CM | POA: Diagnosis not present

## 2018-12-25 ENCOUNTER — Other Ambulatory Visit: Payer: Self-pay | Admitting: *Deleted

## 2018-12-25 ENCOUNTER — Other Ambulatory Visit: Payer: Self-pay

## 2018-12-25 DIAGNOSIS — J9621 Acute and chronic respiratory failure with hypoxia: Secondary | ICD-10-CM | POA: Diagnosis not present

## 2018-12-25 DIAGNOSIS — J441 Chronic obstructive pulmonary disease with (acute) exacerbation: Secondary | ICD-10-CM | POA: Diagnosis not present

## 2018-12-25 DIAGNOSIS — I10 Essential (primary) hypertension: Secondary | ICD-10-CM | POA: Diagnosis not present

## 2018-12-25 DIAGNOSIS — I2510 Atherosclerotic heart disease of native coronary artery without angina pectoris: Secondary | ICD-10-CM | POA: Diagnosis not present

## 2018-12-25 DIAGNOSIS — I5042 Chronic combined systolic (congestive) and diastolic (congestive) heart failure: Secondary | ICD-10-CM | POA: Diagnosis not present

## 2018-12-25 DIAGNOSIS — I110 Hypertensive heart disease with heart failure: Secondary | ICD-10-CM | POA: Diagnosis not present

## 2018-12-25 DIAGNOSIS — I5022 Chronic systolic (congestive) heart failure: Secondary | ICD-10-CM | POA: Diagnosis not present

## 2018-12-25 DIAGNOSIS — Z9981 Dependence on supplemental oxygen: Secondary | ICD-10-CM | POA: Diagnosis not present

## 2018-12-25 NOTE — Patient Outreach (Addendum)
[facility]) Care Management  12/25/2018  Quetzali Heinle Gorden 09/26/1935 [id number]   CSW was able to make initial contact with pt and confirmed pt's identity. CSW introduced self, role and reason for call. Pt does not want to pursue placement at this time; stating she owns her home and is on Medicare and Medicaid.  Pt shared with CSW that she had a heart attack and has a brain tumor; "my Doctor wants me to not live alone".  CSW discussed placement options and the cost/payment logistics with her; she is concerned about losing her home/property and understands her assets will have to be considered and income producing per Medicaid guidelines.  Pt reports she lives alone, drives but not much. She has a son and daughter who live in nearby towns.  She would like to consider some in home support and CSW briefly discussed Medicaid's CAPs, PCS and PACE programs.  CSW will ask [employer], BSW, to outreach pt next week to discuss and assist further with in-home support/services and options to consider to provide her optimal quality of life and care in home setting.  Pt had [facility] Nurse arrive while talking to me who I actually spoke with per pt's permission. [facility], Clarise Cruz, shared she has been following pt for a while and feels she is safe to live alone. CSW suggested some in-home support options to which the [facility] felt would be beneficial.     CSW will also plan outreach call/follow up in 1-2 weeks for further assessment of needs.   Eduard Clos, MSW, [place] Worker  [place] [phone]

## 2018-12-25 NOTE — Patient Outreach (Signed)
[place] [facility]) Care Management  12/25/2018  Micaila Ziemba Yielding 04-01-1936 [id number]   EMMI: general discharge red alert Referral date: 12/25/18 Referral reason:  Sad/ hopeless/ anxious/ empty  Insurance: Medicare Day # 4  Telephone call to patient regarding EMMI general discharge red alert. HIPAA verified.  RNCM explained reason for call.  Patient states she is not feeling sad/ hopeless/ anxious or empty. She states she is feeling better than she was feeling. Patient states she sometimes feels sad and lonesome but states this has gotten better. Patient reports she has a friend coming to see her later today.  Patient states, " I try not to dwell on things."  Patient denies any need for further follow up at this time.   PLAN:  RNCm will close case due to patient being assessed and having no further needs.   Quinn Plowman RN,BSN,CCM [facility] Telephonic  [phone]

## 2018-12-29 ENCOUNTER — Other Ambulatory Visit: Payer: Self-pay

## 2018-12-29 DIAGNOSIS — J9621 Acute and chronic respiratory failure with hypoxia: Secondary | ICD-10-CM | POA: Diagnosis not present

## 2018-12-29 DIAGNOSIS — I5042 Chronic combined systolic (congestive) and diastolic (congestive) heart failure: Secondary | ICD-10-CM | POA: Diagnosis not present

## 2018-12-29 DIAGNOSIS — Z9981 Dependence on supplemental oxygen: Secondary | ICD-10-CM | POA: Diagnosis not present

## 2018-12-29 DIAGNOSIS — J441 Chronic obstructive pulmonary disease with (acute) exacerbation: Secondary | ICD-10-CM | POA: Diagnosis not present

## 2018-12-29 DIAGNOSIS — I110 Hypertensive heart disease with heart failure: Secondary | ICD-10-CM | POA: Diagnosis not present

## 2018-12-29 DIAGNOSIS — I2510 Atherosclerotic heart disease of native coronary artery without angina pectoris: Secondary | ICD-10-CM | POA: Diagnosis not present

## 2018-12-29 NOTE — Patient Outreach (Signed)
[facility]) Care Management  12/29/2018  [name] 1936/02/13 [id number]   Successful outreach to patient regarding social work referral received from [place], Eduard Clos, for in-home support.  BSW and patient discussed multiple in-home services, however, patient denied the need for assistance at this time.  She stated "anything that an aide would help with, I can do myself"  BSW is closing case but provided patient with my contact information and encouraged her to call if she would like to pursue services.  Ronn Melena, BSW Social Worker [phone]

## 2018-12-31 DIAGNOSIS — J441 Chronic obstructive pulmonary disease with (acute) exacerbation: Secondary | ICD-10-CM | POA: Diagnosis not present

## 2018-12-31 DIAGNOSIS — Z9981 Dependence on supplemental oxygen: Secondary | ICD-10-CM | POA: Diagnosis not present

## 2018-12-31 DIAGNOSIS — I2510 Atherosclerotic heart disease of native coronary artery without angina pectoris: Secondary | ICD-10-CM | POA: Diagnosis not present

## 2018-12-31 DIAGNOSIS — I110 Hypertensive heart disease with heart failure: Secondary | ICD-10-CM | POA: Diagnosis not present

## 2018-12-31 DIAGNOSIS — J9621 Acute and chronic respiratory failure with hypoxia: Secondary | ICD-10-CM | POA: Diagnosis not present

## 2018-12-31 DIAGNOSIS — I5042 Chronic combined systolic (congestive) and diastolic (congestive) heart failure: Secondary | ICD-10-CM | POA: Diagnosis not present

## 2019-01-05 ENCOUNTER — Other Ambulatory Visit: Payer: Self-pay | Admitting: *Deleted

## 2019-01-05 ENCOUNTER — Ambulatory Visit: Payer: Self-pay | Admitting: *Deleted

## 2019-01-05 DIAGNOSIS — Z9981 Dependence on supplemental oxygen: Secondary | ICD-10-CM | POA: Diagnosis not present

## 2019-01-05 DIAGNOSIS — I110 Hypertensive heart disease with heart failure: Secondary | ICD-10-CM | POA: Diagnosis not present

## 2019-01-05 DIAGNOSIS — J9621 Acute and chronic respiratory failure with hypoxia: Secondary | ICD-10-CM | POA: Diagnosis not present

## 2019-01-05 DIAGNOSIS — J441 Chronic obstructive pulmonary disease with (acute) exacerbation: Secondary | ICD-10-CM | POA: Diagnosis not present

## 2019-01-05 DIAGNOSIS — I5042 Chronic combined systolic (congestive) and diastolic (congestive) heart failure: Secondary | ICD-10-CM | POA: Diagnosis not present

## 2019-01-05 DIAGNOSIS — I2510 Atherosclerotic heart disease of native coronary artery without angina pectoris: Secondary | ICD-10-CM | POA: Diagnosis not present

## 2019-01-05 NOTE — Patient Outreach (Signed)
[place] [facility]) Care Management  01/05/2019  [name] [birth year] [id number]   CSW attempted contact with pt today and was unsuccessful. CSW left a HIPPA compliant voice message and will try again in 3-4 business days if no return call is received.   Eduard Clos, MSW, [place] Worker  [place] [phone]

## 2019-01-07 DIAGNOSIS — I5042 Chronic combined systolic (congestive) and diastolic (congestive) heart failure: Secondary | ICD-10-CM | POA: Diagnosis not present

## 2019-01-07 DIAGNOSIS — J441 Chronic obstructive pulmonary disease with (acute) exacerbation: Secondary | ICD-10-CM | POA: Diagnosis not present

## 2019-01-07 DIAGNOSIS — Z9981 Dependence on supplemental oxygen: Secondary | ICD-10-CM | POA: Diagnosis not present

## 2019-01-07 DIAGNOSIS — I110 Hypertensive heart disease with heart failure: Secondary | ICD-10-CM | POA: Diagnosis not present

## 2019-01-07 DIAGNOSIS — J9621 Acute and chronic respiratory failure with hypoxia: Secondary | ICD-10-CM | POA: Diagnosis not present

## 2019-01-07 DIAGNOSIS — I2510 Atherosclerotic heart disease of native coronary artery without angina pectoris: Secondary | ICD-10-CM | POA: Diagnosis not present

## 2019-01-08 ENCOUNTER — Other Ambulatory Visit: Payer: Self-pay | Admitting: *Deleted

## 2019-01-08 ENCOUNTER — Ambulatory Visit: Payer: Self-pay | Admitting: *Deleted

## 2019-01-08 ENCOUNTER — Encounter: Payer: Self-pay | Admitting: *Deleted

## 2019-01-08 DIAGNOSIS — J441 Chronic obstructive pulmonary disease with (acute) exacerbation: Secondary | ICD-10-CM | POA: Diagnosis not present

## 2019-01-08 DIAGNOSIS — I5042 Chronic combined systolic (congestive) and diastolic (congestive) heart failure: Secondary | ICD-10-CM | POA: Diagnosis not present

## 2019-01-08 DIAGNOSIS — I2510 Atherosclerotic heart disease of native coronary artery without angina pectoris: Secondary | ICD-10-CM | POA: Diagnosis not present

## 2019-01-08 DIAGNOSIS — J9621 Acute and chronic respiratory failure with hypoxia: Secondary | ICD-10-CM | POA: Diagnosis not present

## 2019-01-08 DIAGNOSIS — Z9981 Dependence on supplemental oxygen: Secondary | ICD-10-CM | POA: Diagnosis not present

## 2019-01-08 DIAGNOSIS — I110 Hypertensive heart disease with heart failure: Secondary | ICD-10-CM | POA: Diagnosis not present

## 2019-01-08 NOTE — Patient Outreach (Signed)
[facility]) Care Management  01/08/2019  [name] 09-19-1935 [id number]   CSW attempted to reach pt by phone today and was unsuccessful. CSW left a HIPPA compliant message and will plan to send pt an unsuccessful outreach letter and try again in the 3 attempts in 10 days protocol if no return call is received.   Eduard Clos, MSW, [place] Worker  [place] [phone]

## 2019-01-12 ENCOUNTER — Other Ambulatory Visit: Payer: Self-pay | Admitting: *Deleted

## 2019-01-12 NOTE — Patient Outreach (Signed)
[place] [facility]) Care Management  01/12/2019  [name] Dec 03, 1935 [id number]   CSW spoke with pt who reports plans for a granddaughter to need to move in with her because of some unforeseen circumstances of their own.  Pt unsure when or if it will occur and if it will be temporary or longer. CSW attempted to reach pt's daughter also to get info about this and left a message for return call.  CSW will plan another call to pt in the next 7-10 days for updates and further support/services.   Eduard Clos, MSW, [place] Worker  [place] [phone]

## 2019-01-13 ENCOUNTER — Ambulatory Visit: Payer: Medicare Other | Admitting: Cardiovascular Disease

## 2019-01-13 DIAGNOSIS — I110 Hypertensive heart disease with heart failure: Secondary | ICD-10-CM | POA: Diagnosis not present

## 2019-01-13 DIAGNOSIS — J9621 Acute and chronic respiratory failure with hypoxia: Secondary | ICD-10-CM | POA: Diagnosis not present

## 2019-01-13 DIAGNOSIS — Z9981 Dependence on supplemental oxygen: Secondary | ICD-10-CM | POA: Diagnosis not present

## 2019-01-13 DIAGNOSIS — J441 Chronic obstructive pulmonary disease with (acute) exacerbation: Secondary | ICD-10-CM | POA: Diagnosis not present

## 2019-01-13 DIAGNOSIS — I5042 Chronic combined systolic (congestive) and diastolic (congestive) heart failure: Secondary | ICD-10-CM | POA: Diagnosis not present

## 2019-01-13 DIAGNOSIS — I2510 Atherosclerotic heart disease of native coronary artery without angina pectoris: Secondary | ICD-10-CM | POA: Diagnosis not present

## 2019-01-16 ENCOUNTER — Emergency Department (HOSPITAL_COMMUNITY)
Admission: EM | Admit: 2019-01-16 | Discharge: 2019-01-16 | Disposition: A | Discharge status: Home / Self Care | Payer: Medicare Other | Attending: Emergency Medicine | Admitting: Emergency Medicine

## 2019-01-16 ENCOUNTER — Emergency Department (HOSPITAL_COMMUNITY): Payer: Medicare Other

## 2019-01-16 ENCOUNTER — Encounter (HOSPITAL_COMMUNITY): Payer: Self-pay | Admitting: Emergency Medicine

## 2019-01-16 ENCOUNTER — Other Ambulatory Visit: Payer: Self-pay

## 2019-01-16 DIAGNOSIS — Z955 Presence of coronary angioplasty implant and graft: Secondary | ICD-10-CM | POA: Diagnosis not present

## 2019-01-16 DIAGNOSIS — Z7982 Long term (current) use of aspirin: Secondary | ICD-10-CM | POA: Insufficient documentation

## 2019-01-16 DIAGNOSIS — I25118 Atherosclerotic heart disease of native coronary artery with other forms of angina pectoris: Secondary | ICD-10-CM | POA: Insufficient documentation

## 2019-01-16 DIAGNOSIS — Z9981 Dependence on supplemental oxygen: Secondary | ICD-10-CM | POA: Diagnosis not present

## 2019-01-16 DIAGNOSIS — J449 Chronic obstructive pulmonary disease, unspecified: Secondary | ICD-10-CM | POA: Insufficient documentation

## 2019-01-16 DIAGNOSIS — I110 Hypertensive heart disease with heart failure: Secondary | ICD-10-CM | POA: Insufficient documentation

## 2019-01-16 DIAGNOSIS — Z79899 Other long term (current) drug therapy: Secondary | ICD-10-CM | POA: Insufficient documentation

## 2019-01-16 DIAGNOSIS — I252 Old myocardial infarction: Secondary | ICD-10-CM | POA: Diagnosis not present

## 2019-01-16 DIAGNOSIS — M79662 Pain in left lower leg: Secondary | ICD-10-CM | POA: Diagnosis not present

## 2019-01-16 DIAGNOSIS — J441 Chronic obstructive pulmonary disease with (acute) exacerbation: Secondary | ICD-10-CM | POA: Diagnosis not present

## 2019-01-16 DIAGNOSIS — M79605 Pain in left leg: Secondary | ICD-10-CM | POA: Insufficient documentation

## 2019-01-16 DIAGNOSIS — M7989 Other specified soft tissue disorders: Secondary | ICD-10-CM | POA: Diagnosis not present

## 2019-01-16 DIAGNOSIS — I5042 Chronic combined systolic (congestive) and diastolic (congestive) heart failure: Secondary | ICD-10-CM | POA: Diagnosis not present

## 2019-01-16 DIAGNOSIS — J9621 Acute and chronic respiratory failure with hypoxia: Secondary | ICD-10-CM | POA: Diagnosis not present

## 2019-01-16 DIAGNOSIS — I2510 Atherosclerotic heart disease of native coronary artery without angina pectoris: Secondary | ICD-10-CM | POA: Diagnosis not present

## 2019-01-16 NOTE — ED Provider Notes (Signed)
[facility] EMERGENCY DEPARTMENT Provider Note   CSN: [id number] Arrival date & time: 01/16/19  1033     History   Chief Complaint Chief Complaint  Patient presents with   Leg Pain    HPI [name] is a 83 y.o. female with PMH/o COPD, MI, OA who presents for 1 week of left lower leg pain and swelling.  She states that she does not recall any specific trauma, injury, fall.  She states that over the week, the pain is gotten worse.  She states that she feels like her calf is slightly swollen.  She has not noted any overlying warmth, erythema.  She states she has measured temperatures at home of 99.2 but nothing higher.  She is still been able to ambulate and bear weight.  She feels like her pain is slightly worse with ambulation.  Denies any numbness/weakness, CP, SOB.       The history is provided by the patient.    Past Medical History:  Diagnosis Date   Anxiety    Cervical disc disorder with myelopathy, unspecified cervical region    Chronic systolic (congestive) heart failure (HCC)    COPD (chronic obstructive pulmonary disease) (HCC)    Essential hypertension    Hemorrhoids    Liver mass    Lung, cysts, congenital    Left lung cyst   Myocardial infarction ([place])    Nephrolithiasis    Embedded   Nonischemic cardiomyopathy ([place])    LVEF 35-40% 2015   On home O2    Osteoarthritis    Thoracic ascending aortic aneurysm (HCC)    4.3 cm April 2016    Patient Active Problem List   Diagnosis Date Noted   Acute on chronic respiratory failure with hypoxia ([place]) 04/24/2018   Chronic stable angina ([place]) 04/24/2018   Physical deconditioning 04/24/2018   CAD (coronary artery disease) 02/03/2018   Ischemic cardiomyopathy 11/25/2017   Dizziness 04/01/2017   COPD (chronic obstructive pulmonary disease) ([place]) 04/01/2017   CAD S/P percutaneous coronary angioplasty 04/01/2017   NSTEMI (non-ST elevated myocardial infarction) ([place]) 11/23/2016    Hypertensive cardiovascular disease 10/03/2015   Protein-calorie malnutrition, severe 07/13/2015   Demand ischemia of myocardium ([place]) 07/13/2015   Nonischemic cardiomyopathy ([place])    Atypical chest pain    Left bundle branch block    COPD exacerbation ([place]) 07/11/2015   Flu-like symptoms 07/11/2015   Elevated troponin 07/11/2015   Acute bronchitis 07/11/2015   GERD (gastroesophageal reflux disease) 07/11/2015   Bronchospasm    Ascending aortic aneurysm ([place]) 01/27/2015   Chronic combined systolic and diastolic CHF (congestive heart failure) ([place]) 01/27/2015   Chest pain at rest 01/27/2015   Essential hypertension 01/27/2015   Anxiety 01/27/2015   Chronic pain 01/27/2015   Chest pain 08/20/2013   Hypertension 08/20/2013   Contusion of left knee 08/07/2012   Sprain of wrist 08/07/2012   Liver mass 05/21/2011   Abdominal pain 05/21/2011   Bronchitis 05/21/2011   De Quervain's disease (tenosynovitis) 04/02/2011   SHOULDER PAIN 01/19/2009   CERVICALGIA 01/19/2009   IMPINGEMENT SYNDROME 01/19/2009    Past Surgical History:  Procedure Laterality Date   Benign breast tumors     CHOLECYSTECTOMY     COLONOSCOPY     COLONOSCOPY N/A 09/22/2014   Procedure: COLONOSCOPY;  Surgeon: Rogene Houston, MD;  [facility]: [facility];  Service: Endoscopy;  Laterality: N/A;  830 -- to be done in OR under fluoro   Complete hysterectomy     CORONARY  STENT INTERVENTION N/A 11/23/2016   Procedure: Coronary Stent Intervention;  Surgeon: Martinique, Peter M, MD;  Location: [place] CV LAB;  Service: Cardiovascular;  Laterality: N/A;   LEFT HEART CATH AND CORONARY ANGIOGRAPHY N/A 11/23/2016   Procedure: Left Heart Cath and Coronary Angiography;  Surgeon: Martinique, Peter M, MD;  Location: [place] CV LAB;  Service: Cardiovascular;  Laterality: N/A;   TONSILLECTOMY AND ADENOIDECTOMY       OB History    Gravida  3   Para  3   Term  3   Preterm      AB       Living  2     SAB      TAB      Ectopic      Multiple      Live Births               Home Medications    Prior to Admission medications   Medication Sig Start Date End Date Taking? Authorizing Provider  albuterol (PROVENTIL) (2.5 MG/3ML) 0.083% nebulizer solution Take 3 mLs (2.5 mg total) by nebulization every 6 (six) hours as needed for wheezing or shortness of breath. 07/13/15   Sinda Du, MD  aspirin EC 81 MG tablet Take 81 mg by mouth every morning.     [provider]  atorvastatin (LIPITOR) 80 MG tablet Take 1 tablet (80 mg total) by mouth daily at 6 PM. 11/24/16   Bhagat, Bhavinkumar, PA  carvedilol (COREG) 6.25 MG tablet TAKE ONE TABLET BY MOUTH 2 TIMES A DAY. Patient taking differently: Take 6.25 mg by mouth 2 (two) times daily with a meal.  01/20/18   Herminio Commons, MD  clorazepate (TRANXENE) 7.5 MG tablet Take 7.5 mg by mouth daily as needed for anxiety. For nerves    [provider]  ENTRESTO 24-26 MG TAKE 1 TABLET BY MOUTH TWICE DAILY. Patient taking differently: Take 1 tablet by mouth 2 (two) times daily.  09/12/18   Herminio Commons, MD  meclizine (ANTIVERT) 25 MG tablet Take 1 or 2  Every 6 hours Patient taking differently: Take 12.5-25 mg by mouth 4 (four) times daily as needed for dizziness.  04/03/17   Sinda Du, MD  nitroGLYCERIN (NITROSTAT) 0.4 MG SL tablet Place 1 tablet (0.4 mg total) under the tongue every 5 (five) minutes x 3 doses as needed for chest pain. 02/05/18   Sinda Du, MD  OXYGEN Inhale 2-2.5 L into the lungs at bedtime.     [provider]  predniSONE (STERAPRED UNI-PAK 21 TAB) 10 MG (21) TBPK tablet Take by package instructions 12/19/18   Sinda Du, MD  PROAIR HFA 108 (90 BASE) MCG/ACT inhaler Inhale 2 puffs into the lungs every 6 (six) hours as needed for wheezing or shortness of breath.  02/08/14   [provider]  SYMBICORT 160-4.5 MCG/ACT inhaler Inhale 2 puffs into the lungs 2  (two) times daily. 12/19/18   Sinda Du, MD  traMADol (ULTRAM) 50 MG tablet Take 50 mg by mouth daily as needed for moderate pain or severe pain. Maximum dose= 8 tablets per day    [provider]    Family History Family History  Problem Relation Age of Onset   Heart disease Mother    Aneurysm Father    Lung cancer Brother    Heart disease Sister    Diabetes Brother    Heart disease Brother     Social History Social History   Tobacco  Use   Smoking status: Former Smoker    Packs/day: 0.25    Years: 31.00    Pack years: 7.75    Types: Cigarettes    Start date: 05/14/1977    Quit date: 05/28/2008    Years since quitting: 10.6   Smokeless tobacco: Never Used  Substance Use Topics   Alcohol use: No    Alcohol/week: 0.0 standard drinks   Drug use: No     Allergies   Plavix [clopidogrel bisulfate], Montelukast sodium, Alprazolam, Codeine, Percodan [oxycodone-aspirin], and Valium   Review of Systems Review of Systems  Constitutional: Negative for fever.  Cardiovascular: Negative for leg swelling.  Musculoskeletal:       Leg pain  Skin: Negative for color change.  All other systems reviewed and are negative.    Physical Exam Updated Vital Signs BP 122/74 (BP Location: Left Arm)    Pulse 74    Temp 98 F (36.7 C) (Oral)    Resp 18    Ht 5\' 2"  (1.575 m)    Wt 77 kg    SpO2 95%    BMI 31.05 kg/m   Physical Exam Vitals signs and nursing note reviewed.  Constitutional:      Appearance: Normal appearance. She is well-developed.  HENT:     Head: Normocephalic and atraumatic.  Eyes:     General: Lids are normal.     Conjunctiva/sclera: Conjunctivae normal.     Pupils: Pupils are equal, round, and reactive to light.  Neck:     Musculoskeletal: Full passive range of motion without pain.  Cardiovascular:     Rate and Rhythm: Normal rate and regular rhythm.     Pulses: Normal pulses.          Radial pulses are 2+ on the right side and 2+ on the  left side.       Dorsalis pedis pulses are 2+ on the right side and 2+ on the left side.     Heart sounds: Normal heart sounds. No murmur. No friction rub. No gallop.   Pulmonary:     Effort: Pulmonary effort is normal.     Breath sounds: Normal breath sounds.     Comments: Lungs clear to auscultation bilaterally.  Symmetric chest rise.  No wheezing, rales, rhonchi. Abdominal:     Palpations: Abdomen is soft. Abdomen is not rigid.     Tenderness: There is no abdominal tenderness. There is no guarding.     Comments: Abdomen is soft, non-distended, non-tender. No rigidity, No guarding. No peritoneal signs.  Musculoskeletal: Normal range of motion.       Legs:     Comments: Tenderness palpation noted to left tib-fib.  No overlying warmth, erythema.  No fluctuance, induration noted.  No deformity or crepitus noted.  No tenderness palpation noted to left hip, left ankle.  Flexion/extension of hip intact any difficulty.  No tenderness palpation noted to right lower extremity.  Skin:    General: Skin is warm and dry.     Capillary Refill: Capillary refill takes less than 2 seconds.     Comments: Good distal cap refill. LLE is not dusky in appearance or cool to touch.  Neurological:     Mental Status: She is alert and oriented to person, place, and time.     Comments: Sensation intact along major nerve distributions of BLE  Psychiatric:        Speech: Speech normal.      ED Treatments / Results  Labs (all  labs ordered are listed, but only abnormal results are displayed) Labs Reviewed - No data to display  EKG None  Radiology Dg Tibia/fibula Left  Result Date: 01/16/2019 CLINICAL DATA:  Leg pain EXAM: LEFT TIBIA AND FIBULA - 2 VIEW COMPARISON:  None. FINDINGS: There is nonspecific soft tissue swelling about the lower extremity. There is no displaced fracture. No dislocation. No radiopaque foreign body. There are degenerative changes of the left knee, similar to prior x-ray in 2018. There  is a stable appearance of the fibular head. IMPRESSION: No acute osseous abnormality. Nonspecific soft tissue swelling is noted involving the left lower extremity. Electronically Signed   By: Constance Holster M.D.   On: 01/16/2019 15:28   US Venous Img Lower  Left (dvt Study)  Result Date: 01/16/2019 CLINICAL DATA:  Left lower extremity pain. EXAM: LEFT LOWER EXTREMITY VENOUS DOPPLER ULTRASOUND TECHNIQUE: Gray-scale sonography with graded compression, as well as color Doppler and duplex ultrasound were performed to evaluate the lower extremity deep venous systems from the level of the common femoral vein and including the common femoral, femoral, profunda femoral, popliteal and calf veins including the posterior tibial, peroneal and gastrocnemius veins when visible. The superficial great saphenous vein was also interrogated. Spectral Doppler was utilized to evaluate flow at rest and with distal augmentation maneuvers in the common femoral, femoral and popliteal veins. COMPARISON:  NO PRIOR. FINDINGS: Contralateral Common Femoral Vein: Respiratory phasicity is normal and symmetric with the symptomatic side. No evidence of thrombus. Normal compressibility. Common Femoral Vein: No evidence of thrombus. Normal compressibility, respiratory phasicity and response to augmentation. Saphenofemoral Junction: No evidence of thrombus. Normal compressibility and flow on color Doppler imaging. Profunda Femoral Vein: No evidence of thrombus. Normal compressibility and flow on color Doppler imaging. Femoral Vein: No evidence of thrombus. Normal compressibility, respiratory phasicity and response to augmentation. Popliteal Vein: No evidence of thrombus. Normal compressibility, respiratory phasicity and response to augmentation. Calf Veins: No evidence of thrombus. Normal compressibility and flow on color Doppler imaging. Superficial Great Saphenous Vein: No evidence of thrombus. Normal compressibility. Other Findings:  None.  IMPRESSION: No evidence of deep venous thrombosis. Electronically Signed   By: Marcello Moores  Register   On: 01/16/2019 11:55    Procedures Procedures (including critical care time)  Medications Ordered in ED Medications - No data to display   Initial Impression / Assessment and Plan / ED Course  I have reviewed the triage vital signs and the nursing notes.  Pertinent labs & imaging results that were available during my care of the patient were reviewed by me and considered in my medical decision making (see chart for details).        82 year old female who presents for evaluation of left leg pain x1 week.  Feels like it may be slightly swollen.  No overlying warmth, erythema.  States her temperature has been around 99.2 but otherwise denies any fevers, numbness/weakness, chest pain, difficulty breathing.  On initial ED arrival, she is afebrile, nontoxic appearing, sitting comfortably on bed with no signs of acute distress.  Vital stable.  Patient is neurovascularly intact.  On exam, she has tenderness palpation left tib-fib area.  She has a little bit of soft tissue swelling but no no overlying edema, warmth.  Patient with good distal pulses.  Question DVT versus bony abnormality versus chronic venous insufficiency versus musculoskeletal pain.  Do not feel anything that would be worrisome or concerning for abscess.  Additionally, her exam is not concerning for cellulitis, septic arthritis, acute  arterial embolism/ischemic limb.  Plan for venous duplex and x-ray.  Venous duplex negative for any DVT.  X-ray of tib-fib shows nonspecific soft tissue swelling.  No evidence of any acute bony abnormality.  Discussed results with patient.  Patient is comfortable going home.  Encouraged at home supportive care measures.  Instructed to follow-up with PCP. Discussed patient with Dr. Alvino Chapel who is agreeable to plan. At this time, patient exhibits no emergent life-threatening condition that require further  evaluation in ED or admission. Patient had ample opportunity for questions and discussion. All patient's questions were answered with full understanding. Strict return precautions discussed. Patient expresses understanding and agreement to plan.   Portions of this note were generated with [employer]. Dictation errors may occur despite best attempts at proofreading.   Final Clinical Impressions(s) / ED Diagnoses   Final diagnoses:  Left leg pain    ED Discharge Orders    None       Desma Mcgregor 01/16/19 2211    Davonna Belling, MD 01/16/19 2242

## 2019-01-16 NOTE — ED Notes (Signed)
ED Provider at bedside. 

## 2019-01-16 NOTE — ED Triage Notes (Signed)
Patient reports L lower leg pain and swelling x 1 week.

## 2019-01-16 NOTE — Discharge Instructions (Signed)
You can take 1000 mg of Tylenol.  Do not exceed 4000 mg of Tylenol a day.  As we discussed, please follow-up with your primary care doctor.  Return the emergency department for any worsening pain, redness or swelling of leg, fevers, numbness/weakness, inability to walk or any other worsening concerning symptoms.

## 2019-01-16 NOTE — ED Notes (Signed)
Pt given graham crackers and ginger ale with EDP approval.

## 2019-01-19 DIAGNOSIS — Z7982 Long term (current) use of aspirin: Secondary | ICD-10-CM | POA: Diagnosis not present

## 2019-01-19 DIAGNOSIS — I2510 Atherosclerotic heart disease of native coronary artery without angina pectoris: Secondary | ICD-10-CM | POA: Diagnosis not present

## 2019-01-19 DIAGNOSIS — J441 Chronic obstructive pulmonary disease with (acute) exacerbation: Secondary | ICD-10-CM | POA: Diagnosis not present

## 2019-01-19 DIAGNOSIS — I110 Hypertensive heart disease with heart failure: Secondary | ICD-10-CM | POA: Diagnosis not present

## 2019-01-19 DIAGNOSIS — Z9981 Dependence on supplemental oxygen: Secondary | ICD-10-CM | POA: Diagnosis not present

## 2019-01-19 DIAGNOSIS — J9621 Acute and chronic respiratory failure with hypoxia: Secondary | ICD-10-CM | POA: Diagnosis not present

## 2019-01-19 DIAGNOSIS — I5042 Chronic combined systolic (congestive) and diastolic (congestive) heart failure: Secondary | ICD-10-CM | POA: Diagnosis not present

## 2019-01-22 DIAGNOSIS — I110 Hypertensive heart disease with heart failure: Secondary | ICD-10-CM | POA: Diagnosis not present

## 2019-01-22 DIAGNOSIS — M79606 Pain in leg, unspecified: Secondary | ICD-10-CM | POA: Diagnosis not present

## 2019-01-22 DIAGNOSIS — J9621 Acute and chronic respiratory failure with hypoxia: Secondary | ICD-10-CM | POA: Diagnosis not present

## 2019-01-22 DIAGNOSIS — I2510 Atherosclerotic heart disease of native coronary artery without angina pectoris: Secondary | ICD-10-CM | POA: Diagnosis not present

## 2019-01-22 DIAGNOSIS — I5042 Chronic combined systolic (congestive) and diastolic (congestive) heart failure: Secondary | ICD-10-CM | POA: Diagnosis not present

## 2019-01-22 DIAGNOSIS — J449 Chronic obstructive pulmonary disease, unspecified: Secondary | ICD-10-CM | POA: Diagnosis not present

## 2019-01-22 DIAGNOSIS — Z9981 Dependence on supplemental oxygen: Secondary | ICD-10-CM | POA: Diagnosis not present

## 2019-01-22 DIAGNOSIS — J441 Chronic obstructive pulmonary disease with (acute) exacerbation: Secondary | ICD-10-CM | POA: Diagnosis not present

## 2019-01-22 DIAGNOSIS — M545 Low back pain: Secondary | ICD-10-CM | POA: Diagnosis not present

## 2019-01-23 ENCOUNTER — Ambulatory Visit: Payer: Self-pay | Admitting: *Deleted

## 2019-01-26 DIAGNOSIS — Z9981 Dependence on supplemental oxygen: Secondary | ICD-10-CM | POA: Diagnosis not present

## 2019-01-26 DIAGNOSIS — J441 Chronic obstructive pulmonary disease with (acute) exacerbation: Secondary | ICD-10-CM | POA: Diagnosis not present

## 2019-01-26 DIAGNOSIS — I2510 Atherosclerotic heart disease of native coronary artery without angina pectoris: Secondary | ICD-10-CM | POA: Diagnosis not present

## 2019-01-26 DIAGNOSIS — I5042 Chronic combined systolic (congestive) and diastolic (congestive) heart failure: Secondary | ICD-10-CM | POA: Diagnosis not present

## 2019-01-26 DIAGNOSIS — I110 Hypertensive heart disease with heart failure: Secondary | ICD-10-CM | POA: Diagnosis not present

## 2019-01-26 DIAGNOSIS — J9621 Acute and chronic respiratory failure with hypoxia: Secondary | ICD-10-CM | POA: Diagnosis not present

## 2019-01-27 ENCOUNTER — Ambulatory Visit: Payer: Self-pay | Admitting: *Deleted

## 2019-01-28 ENCOUNTER — Other Ambulatory Visit: Payer: Self-pay | Admitting: *Deleted

## 2019-01-28 ENCOUNTER — Encounter: Payer: Self-pay | Admitting: *Deleted

## 2019-01-28 NOTE — Patient Outreach (Signed)
[place] [facility]) Care Management  01/28/2019  Alfhild Feiner Walsh Sep 09, 1935 [id number]   CSW attempted to reach pt by phone and was unsuccessful.  CSW left a HIPPA compliant message and will try again in the 3 attempts in 10 days protocol.  CSW will also mail pt an unsuccessful outreach letter.   Eduard Clos, MSW, [place] Worker  [place] [phone]

## 2019-02-02 DIAGNOSIS — Z9981 Dependence on supplemental oxygen: Secondary | ICD-10-CM | POA: Diagnosis not present

## 2019-02-02 DIAGNOSIS — I5042 Chronic combined systolic (congestive) and diastolic (congestive) heart failure: Secondary | ICD-10-CM | POA: Diagnosis not present

## 2019-02-02 DIAGNOSIS — J9621 Acute and chronic respiratory failure with hypoxia: Secondary | ICD-10-CM | POA: Diagnosis not present

## 2019-02-02 DIAGNOSIS — I2510 Atherosclerotic heart disease of native coronary artery without angina pectoris: Secondary | ICD-10-CM | POA: Diagnosis not present

## 2019-02-02 DIAGNOSIS — I110 Hypertensive heart disease with heart failure: Secondary | ICD-10-CM | POA: Diagnosis not present

## 2019-02-02 DIAGNOSIS — J441 Chronic obstructive pulmonary disease with (acute) exacerbation: Secondary | ICD-10-CM | POA: Diagnosis not present

## 2019-02-03 ENCOUNTER — Other Ambulatory Visit: Payer: Self-pay | Admitting: *Deleted

## 2019-02-04 NOTE — Patient Outreach (Signed)
[place] [facility]) Care Management  02/04/2019  Abella Euceda Kosloski 1935-10-23 [id number]   CSW spoke with pt who reports she is doing well- the [facility] is coming out weekly and she is wanting to stay in her home. "I am still able" and want to stay at home. She is interested in seeing if she may be able to get additional services/support. CSW will ask Amber Chrismon, BSW, to discuss with pt possible in home support (PCS/CAP's, etc).   CSW will sign off and can be re-consulted if needs arise.   Eduard Clos, MSW, [place] Worker  [place] [phone]

## 2019-02-05 ENCOUNTER — Other Ambulatory Visit: Payer: Self-pay

## 2019-02-05 NOTE — Patient Outreach (Signed)
[place] [facility]) Care Management  02/05/2019  [name] December 03, 1935 [id number]   Request received from [place], Eduard Clos, to outreach patient regarding personal care services.  Successful outreach to patient today.  Discussed request/assessment process for services.  Faxed request form to Dr. Sinda Du for completion.   Will follow up with patient when confirmation of completed form is received from [facility] as patient will need to have assessment scheduled at that time.    Ronn Melena, BSW Social Worker [phone]

## 2019-02-06 DIAGNOSIS — M19011 Primary osteoarthritis, right shoulder: Secondary | ICD-10-CM | POA: Diagnosis not present

## 2019-02-09 ENCOUNTER — Other Ambulatory Visit: Payer: Self-pay

## 2019-02-09 NOTE — Patient Outreach (Signed)
[facility]) Care Management  02/09/2019  [name] 20-May-1935 [id number]   Contacted Dr. Luan Pulling office to inform that request for personal care services was faxed again on 02/06/19 due to form received on 02/05/19 being incomplete.  Ronn Melena, BSW Social Worker [phone]

## 2019-02-10 DIAGNOSIS — I5042 Chronic combined systolic (congestive) and diastolic (congestive) heart failure: Secondary | ICD-10-CM | POA: Diagnosis not present

## 2019-02-10 DIAGNOSIS — J441 Chronic obstructive pulmonary disease with (acute) exacerbation: Secondary | ICD-10-CM | POA: Diagnosis not present

## 2019-02-10 DIAGNOSIS — I110 Hypertensive heart disease with heart failure: Secondary | ICD-10-CM | POA: Diagnosis not present

## 2019-02-10 DIAGNOSIS — I2510 Atherosclerotic heart disease of native coronary artery without angina pectoris: Secondary | ICD-10-CM | POA: Diagnosis not present

## 2019-02-10 DIAGNOSIS — Z9981 Dependence on supplemental oxygen: Secondary | ICD-10-CM | POA: Diagnosis not present

## 2019-02-10 DIAGNOSIS — J9621 Acute and chronic respiratory failure with hypoxia: Secondary | ICD-10-CM | POA: Diagnosis not present

## 2019-02-16 DIAGNOSIS — I2510 Atherosclerotic heart disease of native coronary artery without angina pectoris: Secondary | ICD-10-CM | POA: Diagnosis not present

## 2019-02-16 DIAGNOSIS — I5042 Chronic combined systolic (congestive) and diastolic (congestive) heart failure: Secondary | ICD-10-CM | POA: Diagnosis not present

## 2019-02-16 DIAGNOSIS — Z9981 Dependence on supplemental oxygen: Secondary | ICD-10-CM | POA: Diagnosis not present

## 2019-02-16 DIAGNOSIS — I110 Hypertensive heart disease with heart failure: Secondary | ICD-10-CM | POA: Diagnosis not present

## 2019-02-16 DIAGNOSIS — J441 Chronic obstructive pulmonary disease with (acute) exacerbation: Secondary | ICD-10-CM | POA: Diagnosis not present

## 2019-02-16 DIAGNOSIS — J9621 Acute and chronic respiratory failure with hypoxia: Secondary | ICD-10-CM | POA: Diagnosis not present

## 2019-02-17 ENCOUNTER — Other Ambulatory Visit: Payer: Self-pay

## 2019-02-17 NOTE — Patient Outreach (Signed)
[place] [facility]) Care Management  02/17/2019  Korrine Perl Strauch [birth year] [id number]   Completed request for [place] faxed to [facility].  Will follow up in 48 hours to ensure receipt and that it is being processed.  Ronn Melena, BSW Social Worker [phone]

## 2019-02-23 ENCOUNTER — Other Ambulatory Visit: Payer: Self-pay

## 2019-02-23 NOTE — Patient Outreach (Signed)
[place] [facility]) Care Management  02/23/2019  [name] 23-Jan-1936 [id number]   [place] regarding status of request for personal care services.  Assessment is scheduled for 02/24/19 @ 10:30 AM.  Contacted patient and reminded her of this.   Will follow up with her again later in the week.  Ronn Melena, BSW Social Worker [phone]

## 2019-02-26 ENCOUNTER — Other Ambulatory Visit: Payer: Self-pay

## 2019-02-26 ENCOUNTER — Ambulatory Visit: Payer: Self-pay

## 2019-02-26 NOTE — Patient Outreach (Signed)
[facility]) Care Management  02/26/2019  [name] 1936/01/06 [id number]   Attempted to contact patient today regarding assessment for pcs that was scheduled for 02/24/19.  Left voicemail message.  Will attempt to reach again within four business days.  Ronn Melena, BSW Social Worker [phone]

## 2019-03-02 ENCOUNTER — Ambulatory Visit: Payer: Self-pay

## 2019-03-02 ENCOUNTER — Other Ambulatory Visit: Payer: Self-pay

## 2019-03-02 NOTE — Patient Outreach (Addendum)
[place] [facility]) Care Management  03/02/2019  [name] May 31, 1935 [id number]   Second attempt to contact patient today regarding assessment for pcs that was scheduled for 02/24/19.  Left voicemail message.  Will attempt to reach again within four business days.  Addendum;  Received return call from patient.  Patient reported that assessment with [facility] did occur but she was not sure of determination.   Contacted [facility] to clarify.  Unfortunately, it was determined that patient is not eligible for services.  Representative is not able to provide more detailed information to those other than patient.  Per representative, they attempted to send letter of determination but it was returned as undeliverable.   Called patient to inform her of above information.  Patient confirmed address but did state she has been having difficulty receiving mail recently.   Provided patient with contact information for [facility] so that she may call regarding determination.   Closing social work case at this time but did encourage her to call if additional needs arise.    Ronn Melena, BSW Social Worker [phone]

## 2019-03-03 ENCOUNTER — Ambulatory Visit: Payer: Self-pay

## 2019-03-05 DIAGNOSIS — I2510 Atherosclerotic heart disease of native coronary artery without angina pectoris: Secondary | ICD-10-CM | POA: Diagnosis not present

## 2019-03-05 DIAGNOSIS — I10 Essential (primary) hypertension: Secondary | ICD-10-CM | POA: Diagnosis not present

## 2019-03-05 DIAGNOSIS — F419 Anxiety disorder, unspecified: Secondary | ICD-10-CM | POA: Diagnosis not present

## 2019-03-05 DIAGNOSIS — J449 Chronic obstructive pulmonary disease, unspecified: Secondary | ICD-10-CM | POA: Diagnosis not present

## 2019-03-17 ENCOUNTER — Telehealth: Payer: Self-pay | Admitting: Cardiovascular Disease

## 2019-03-17 NOTE — Telephone Encounter (Signed)

## 2019-03-25 ENCOUNTER — Other Ambulatory Visit: Payer: Self-pay | Admitting: Cardiovascular Disease

## 2019-03-26 ENCOUNTER — Encounter: Payer: Self-pay | Admitting: Cardiovascular Disease

## 2019-03-26 ENCOUNTER — Telehealth (INDEPENDENT_AMBULATORY_CARE_PROVIDER_SITE_OTHER): Payer: Medicare Other | Admitting: Cardiovascular Disease

## 2019-03-26 VITALS — BP 150/71 | HR 68 | Temp 99.6°F | Wt 173.0 lb

## 2019-03-26 DIAGNOSIS — I25118 Atherosclerotic heart disease of native coronary artery with other forms of angina pectoris: Secondary | ICD-10-CM

## 2019-03-26 DIAGNOSIS — I10 Essential (primary) hypertension: Secondary | ICD-10-CM

## 2019-03-26 DIAGNOSIS — E785 Hyperlipidemia, unspecified: Secondary | ICD-10-CM

## 2019-03-26 DIAGNOSIS — I712 Thoracic aortic aneurysm, without rupture: Secondary | ICD-10-CM

## 2019-03-26 DIAGNOSIS — Z955 Presence of coronary angioplasty implant and graft: Secondary | ICD-10-CM

## 2019-03-26 DIAGNOSIS — I7121 Aneurysm of the ascending aorta, without rupture: Secondary | ICD-10-CM

## 2019-03-26 DIAGNOSIS — I5042 Chronic combined systolic (congestive) and diastolic (congestive) heart failure: Secondary | ICD-10-CM

## 2019-03-26 MED ORDER — NITROGLYCERIN 0.4 MG SL SUBL: 0.4000 mg | SUBLINGUAL_TABLET | SUBLINGUAL | 3 refills | Status: DC | PRN

## 2019-03-26 MED ORDER — ATORVASTATIN CALCIUM 80 MG PO TABS: 80.0000 mg | ORAL_TABLET | Freq: Every day | ORAL | Status: DC

## 2019-03-26 NOTE — Patient Instructions (Signed)
Medication Instructions:   Start taking the Atorvastatin 80mg  daily as previously prescribed.    Contact the pharmacy to fill this for you.   Continue all other medications.    Labwork: none  Testing/Procedures: none  Follow-Up: Your physician wants you to follow up in: 6 months.  You will receive a reminder letter in the mail one-two months in advance.  If you don't receive a letter, please call our office to schedule the follow up appointment   Any Other Special Instructions Will Be Listed Below (If Applicable).  If you need a refill on your cardiac medications before your next appointment, please call your pharmacy.

## 2019-03-26 NOTE — Progress Notes (Signed)
Virtual Visit via Telephone Note   This visit type was conducted due to national recommendations for restrictions regarding the COVID-19 Pandemic (e.g. social distancing) in an effort to limit this patient's exposure and mitigate transmission in our community.  Due to her co-morbid illnesses, this patient is at least at moderate risk for complications without adequate follow up.  This format is felt to be most appropriate for this patient at this time.  The patient did not have access to video technology/had technical difficulties with video requiring transitioning to audio format only (telephone).  All issues noted in this document were discussed and addressed.  No physical exam could be performed with this format.  Please refer to the patient's chart for her  consent to telehealth for [facility].   Date:  03/26/2019   ID:  [name], DOB 06-17-1935, MRN [id number]  Patient Location: Home Provider Location: Office  PCP:  Sinda Du, MD  Cardiologist:  Kate Sable, MD  Electrophysiologist:  None   Evaluation Performed:  Follow-Up Visit  Chief Complaint:  CAD  History of Present Illness:    [name] is a 83 y.o. female with  a h/o coronary artery disease and COPD with frequent exacerbations requiring hospitalizations.  She underwent drug-eluting stent placement to the proximal and mid LAD as well as the first marginal branch on 11/23/16.  She was hospitalized for a COPD exacerbation in August 2020.  She has chronic exertional dyspnea which is stable.  She lives by herself.  She said she is doing very well. She has chronic chest pains. No recent nitro use.  She stopped taking atorvastatin on her own.   Past Medical History:  Diagnosis Date   Anxiety    Cervical disc disorder with myelopathy, unspecified cervical region    Chronic systolic (congestive) heart failure (HCC)    COPD (chronic obstructive pulmonary disease) (HCC)    Essential  hypertension    Hemorrhoids    Liver mass    Lung, cysts, congenital    Left lung cyst   Myocardial infarction ([place])    Nephrolithiasis    Embedded   Nonischemic cardiomyopathy ([place])    LVEF 35-40% 2015   On home O2    Osteoarthritis    Thoracic ascending aortic aneurysm (HCC)    4.3 cm April 2016   Past Surgical History:  Procedure Laterality Date   Benign breast tumors     CHOLECYSTECTOMY     COLONOSCOPY     COLONOSCOPY N/A 09/22/2014   Procedure: COLONOSCOPY;  Surgeon: Rogene Houston, MD;  [facility];  Service: Endoscopy;  Laterality: N/A;  830 -- to be done in OR under fluoro   Complete hysterectomy     CORONARY STENT INTERVENTION N/A 11/23/2016   Procedure: Coronary Stent Intervention;  Surgeon: Martinique, Peter M, MD;  Location: [place] CV LAB;  Service: Cardiovascular;  Laterality: N/A;   LEFT HEART CATH AND CORONARY ANGIOGRAPHY N/A 11/23/2016   Procedure: Left Heart Cath and Coronary Angiography;  Surgeon: Martinique, Peter M, MD;  Location: [place] CV LAB;  Service: Cardiovascular;  Laterality: N/A;   TONSILLECTOMY AND ADENOIDECTOMY       Current Meds  Medication Sig   albuterol (PROVENTIL) (2.5 MG/3ML) 0.083% nebulizer solution Take 3 mLs (2.5 mg total) by nebulization every 6 (six) hours as needed for wheezing or shortness of breath.   aspirin EC 81 MG tablet Take 81 mg by mouth every morning.    carvedilol (COREG)  6.25 MG tablet TAKE ONE TABLET BY MOUTH 2 TIMES A DAY.   clorazepate (TRANXENE) 7.5 MG tablet Take 7.5 mg by mouth daily as needed for anxiety. For nerves   meclizine (ANTIVERT) 25 MG tablet Take 25 mg by mouth as needed for dizziness.   nitroGLYCERIN (NITROSTAT) 0.4 MG SL tablet Place 1 tablet (0.4 mg total) under the tongue every 5 (five) minutes x 3 doses as needed for chest pain.   OXYGEN Inhale 2-2.5 L into the lungs at bedtime.    PROAIR HFA 108 (90 BASE) MCG/ACT inhaler Inhale 2 puffs into the lungs every 6 (six)  hours as needed for wheezing or shortness of breath.    sacubitril-valsartan (ENTRESTO) 24-26 MG Take 1 tablet by mouth daily.   traMADol (ULTRAM) 50 MG tablet Take 50 mg by mouth daily as needed for moderate pain or severe pain. Maximum dose= 8 tablets per day   [DISCONTINUED] nitroGLYCERIN (NITROSTAT) 0.4 MG SL tablet Place 1 tablet (0.4 mg total) under the tongue every 5 (five) minutes x 3 doses as needed for chest pain.     Allergies:   Plavix [clopidogrel bisulfate], Montelukast sodium, Alprazolam, Codeine, Percodan [oxycodone-aspirin], and Valium   Social History   Tobacco Use   Smoking status: Former Smoker    Packs/day: 0.25    Years: 31.00    Pack years: 7.75    Types: Cigarettes    Start date: 05/14/1977    Quit date: 05/28/2008    Years since quitting: 10.8   Smokeless tobacco: Never Used  Substance Use Topics   Alcohol use: No    Alcohol/week: 0.0 standard drinks   Drug use: No     Family Hx: The patient's family history includes Aneurysm in her father; Diabetes in her brother; Heart disease in her brother, mother, and sister; Lung cancer in her brother.  ROS:   Please see the history of present illness.     All other systems reviewed and are negative.   Prior CV studies:   The following studies were reviewed today:  Cath 11/23/2016:   LV end diastolic pressure is normal.  Mid Cx lesion, 30 %stenosed.  Prox RCA lesion, 20 %stenosed.  Prox LAD to Mid LAD lesion, 85 %stenosed.  A STENT PROMUS PREM MR 3.5X32 drug eluting stent was successfully placed.  Post intervention, there is a 0% residual stenosis.  1st Mrg lesion, 90 %stenosed.  A STENT PROMUS PREM MR 2.5X16 drug eluting stent was successfully placed.  Post intervention, there is a 0% residual stenosis.  1. 2 vessel obstructive CAD - 85% segmental mid LAD - 90% mid OM1.  2. Normal LVEDP 3. Successful stenting of the mid LAD with DES 4. Successful stenting of the mid OM1 with  DES   Echocardiogram 12/17/18:    1. The left ventricle has a visually estimated ejection fraction of 45%. The cavity size was normal. There is moderate concentric left ventricular hypertrophy. Left ventricular diastolic Doppler parameters are consistent with impaired relaxation.  Elevated left ventricular end-diastolic pressure Left ventricular diffuse hypokinesis.  2. The right ventricle has normal systolic function. The cavity was normal. There is no increase in right ventricular wall thickness.  3. The mitral valve is grossly normal. Mild thickening of the mitral valve leaflet.  4. The tricuspid valve is grossly normal.  5. The aortic valve was not well visualized. Aortic valve regurgitation is trivial by color flow Doppler.  6. The aorta is normal in size and structure.  7. The inferior  vena cava was dilated in size with >50% respiratory variability.  Labs/Other Tests and Data Reviewed:    EKG:    Recent Labs: 04/24/2018: ALT 11 12/16/2018: B Natriuretic Peptide 203.0 12/17/2018: BUN 15; Creatinine, Ser 0.59; Hemoglobin 11.6; Platelets 270; Potassium 4.0; Sodium 140   Recent Lipid Panel Lab Results  Component Value Date/Time   CHOL 171 12/19/2018 05:28 AM   TRIG 53 12/19/2018 05:28 AM   HDL 79 12/19/2018 05:28 AM   CHOLHDL 2.2 12/19/2018 05:28 AM   LDLCALC 81 12/19/2018 05:28 AM    Wt Readings from Last 3 Encounters:  03/26/19 173 lb (78.5 kg)  01/16/19 169 lb 12.1 oz (77 kg)  12/16/18 169 lb 12.1 oz (77 kg)     Objective:    Vital Signs:  BP (!) 150/71    Pulse 68    Temp 99.6 F (37.6 C)    Wt 173 lb (78.5 kg)    SpO2 98%    BMI 31.64 kg/m      ASSESSMENT & PLAN:    1. Coronary artery disease: She underwent stenting of the LAD and OM1 in July 2018. Symptomatically stable.Continue aspirin. Of note, Brilinta presumably led to increased shortness of breath from baselinein the past. She was switched to Plavix but this led to severe itching.Shetolerated  Effient without difficulty.  Continue carvedilol. She is on an angiotensin receptor blocker given her history of cardiomyopathy. She stopped taking atorvastatin on her own. I encouraged her to take it. I will refill nitro.  2. Chronic hypertension: Blood pressure ismildly elevated. No changes to therapy.  3. Thoracic aortic aneurysm: Stable at 4.2 cm by CT angiography of the chest on 11/11/2017.  4. Chronic combined heart failure: Euvolemic and stable. Continue carvedilol and Entresto.No diuretic requirement at this time.  5. Hyperlipidemia: LDL 81 on 12/19/18. She stopped taking atorvastatin on her own. I encouraged her to take it.     COVID-19 Education: The signs and symptoms of COVID-19 were discussed with the patient and how to seek care for testing (follow up with PCP or arrange E-visit).  The importance of social distancing was discussed today.  Time:   Today, I have spent 10 minutes with the patient with telehealth technology discussing the above problems.     Medication Adjustments/Labs and Tests Ordered: Current medicines are reviewed at length with the patient today.  Concerns regarding medicines are outlined above.   Tests Ordered: No orders of the defined types were placed in this encounter.   Medication Changes: Meds ordered this encounter  Medications   nitroGLYCERIN (NITROSTAT) 0.4 MG SL tablet    Sig: Place 1 tablet (0.4 mg total) under the tongue every 5 (five) minutes x 3 doses as needed for chest pain.    Dispense:  25 tablet    Refill:  3    Follow Up:  Virtual Visit  in 6 month(s)  Signed, Kate Sable, MD  03/26/2019 11:20 AM    [place]

## 2019-03-26 NOTE — Addendum Note (Signed)
Addended by: Laurine Blazer on: 03/26/2019 12:01 PM   Modules accepted: Orders

## 2019-04-12 DIAGNOSIS — R05 Cough: Secondary | ICD-10-CM | POA: Diagnosis not present

## 2019-04-12 DIAGNOSIS — R0602 Shortness of breath: Secondary | ICD-10-CM | POA: Diagnosis not present

## 2019-04-12 DIAGNOSIS — R069 Unspecified abnormalities of breathing: Secondary | ICD-10-CM | POA: Diagnosis not present

## 2019-04-15 DIAGNOSIS — I2510 Atherosclerotic heart disease of native coronary artery without angina pectoris: Secondary | ICD-10-CM | POA: Diagnosis not present

## 2019-04-15 DIAGNOSIS — R42 Dizziness and giddiness: Secondary | ICD-10-CM | POA: Diagnosis not present

## 2019-04-15 DIAGNOSIS — M542 Cervicalgia: Secondary | ICD-10-CM | POA: Diagnosis not present

## 2019-04-15 DIAGNOSIS — F419 Anxiety disorder, unspecified: Secondary | ICD-10-CM | POA: Diagnosis not present

## 2019-04-15 DIAGNOSIS — I5022 Chronic systolic (congestive) heart failure: Secondary | ICD-10-CM | POA: Diagnosis not present

## 2019-04-15 DIAGNOSIS — M1990 Unspecified osteoarthritis, unspecified site: Secondary | ICD-10-CM | POA: Diagnosis not present

## 2019-04-15 DIAGNOSIS — I10 Essential (primary) hypertension: Secondary | ICD-10-CM | POA: Diagnosis not present

## 2019-04-15 DIAGNOSIS — I420 Dilated cardiomyopathy: Secondary | ICD-10-CM | POA: Diagnosis not present

## 2019-04-15 DIAGNOSIS — J449 Chronic obstructive pulmonary disease, unspecified: Secondary | ICD-10-CM | POA: Diagnosis not present

## 2019-04-15 DIAGNOSIS — E785 Hyperlipidemia, unspecified: Secondary | ICD-10-CM | POA: Diagnosis not present

## 2019-05-05 DIAGNOSIS — Z0000 Encounter for general adult medical examination without abnormal findings: Secondary | ICD-10-CM | POA: Diagnosis not present

## 2019-05-05 DIAGNOSIS — J441 Chronic obstructive pulmonary disease with (acute) exacerbation: Secondary | ICD-10-CM | POA: Diagnosis not present

## 2019-05-05 DIAGNOSIS — J209 Acute bronchitis, unspecified: Secondary | ICD-10-CM | POA: Diagnosis not present

## 2019-05-05 DIAGNOSIS — Z1159 Encounter for screening for other viral diseases: Secondary | ICD-10-CM | POA: Diagnosis not present

## 2019-05-05 DIAGNOSIS — Z9981 Dependence on supplemental oxygen: Secondary | ICD-10-CM | POA: Diagnosis not present

## 2019-05-19 DIAGNOSIS — I10 Essential (primary) hypertension: Secondary | ICD-10-CM | POA: Diagnosis not present

## 2019-05-19 DIAGNOSIS — M542 Cervicalgia: Secondary | ICD-10-CM | POA: Diagnosis not present

## 2019-05-19 DIAGNOSIS — F419 Anxiety disorder, unspecified: Secondary | ICD-10-CM | POA: Diagnosis not present

## 2019-05-19 DIAGNOSIS — J449 Chronic obstructive pulmonary disease, unspecified: Secondary | ICD-10-CM | POA: Diagnosis not present

## 2019-05-19 DIAGNOSIS — Z0001 Encounter for general adult medical examination with abnormal findings: Secondary | ICD-10-CM | POA: Diagnosis not present

## 2019-05-19 DIAGNOSIS — M1990 Unspecified osteoarthritis, unspecified site: Secondary | ICD-10-CM | POA: Diagnosis not present

## 2019-05-19 DIAGNOSIS — Z79899 Other long term (current) drug therapy: Secondary | ICD-10-CM | POA: Diagnosis not present

## 2019-05-19 DIAGNOSIS — J309 Allergic rhinitis, unspecified: Secondary | ICD-10-CM | POA: Diagnosis not present

## 2019-05-19 DIAGNOSIS — I5022 Chronic systolic (congestive) heart failure: Secondary | ICD-10-CM | POA: Diagnosis not present

## 2019-05-19 DIAGNOSIS — Z9981 Dependence on supplemental oxygen: Secondary | ICD-10-CM | POA: Diagnosis not present

## 2019-05-19 DIAGNOSIS — I2510 Atherosclerotic heart disease of native coronary artery without angina pectoris: Secondary | ICD-10-CM | POA: Diagnosis not present

## 2019-05-19 DIAGNOSIS — E785 Hyperlipidemia, unspecified: Secondary | ICD-10-CM | POA: Diagnosis not present

## 2019-06-01 ENCOUNTER — Emergency Department (HOSPITAL_COMMUNITY)
Admission: EM | Admit: 2019-06-01 | Discharge: 2019-06-01 | Disposition: A | Discharge status: Home / Self Care | Payer: Medicare Other | Attending: Emergency Medicine | Admitting: Emergency Medicine

## 2019-06-01 ENCOUNTER — Other Ambulatory Visit: Payer: Self-pay

## 2019-06-01 ENCOUNTER — Emergency Department (HOSPITAL_COMMUNITY): Payer: Medicare Other

## 2019-06-01 ENCOUNTER — Encounter (HOSPITAL_COMMUNITY): Payer: Self-pay

## 2019-06-01 DIAGNOSIS — I110 Hypertensive heart disease with heart failure: Secondary | ICD-10-CM | POA: Diagnosis not present

## 2019-06-01 DIAGNOSIS — Z7982 Long term (current) use of aspirin: Secondary | ICD-10-CM | POA: Insufficient documentation

## 2019-06-01 DIAGNOSIS — Z87891 Personal history of nicotine dependence: Secondary | ICD-10-CM | POA: Insufficient documentation

## 2019-06-01 DIAGNOSIS — R069 Unspecified abnormalities of breathing: Secondary | ICD-10-CM | POA: Diagnosis not present

## 2019-06-01 DIAGNOSIS — R0602 Shortness of breath: Secondary | ICD-10-CM | POA: Diagnosis not present

## 2019-06-01 DIAGNOSIS — I259 Chronic ischemic heart disease, unspecified: Secondary | ICD-10-CM | POA: Diagnosis not present

## 2019-06-01 DIAGNOSIS — I499 Cardiac arrhythmia, unspecified: Secondary | ICD-10-CM | POA: Diagnosis not present

## 2019-06-01 DIAGNOSIS — Z79899 Other long term (current) drug therapy: Secondary | ICD-10-CM | POA: Insufficient documentation

## 2019-06-01 DIAGNOSIS — J441 Chronic obstructive pulmonary disease with (acute) exacerbation: Secondary | ICD-10-CM | POA: Diagnosis not present

## 2019-06-01 DIAGNOSIS — I5042 Chronic combined systolic (congestive) and diastolic (congestive) heart failure: Secondary | ICD-10-CM | POA: Diagnosis not present

## 2019-06-01 DIAGNOSIS — R05 Cough: Secondary | ICD-10-CM | POA: Diagnosis not present

## 2019-06-01 DIAGNOSIS — R062 Wheezing: Secondary | ICD-10-CM | POA: Diagnosis not present

## 2019-06-01 DIAGNOSIS — Z20822 Contact with and (suspected) exposure to covid-19: Secondary | ICD-10-CM | POA: Insufficient documentation

## 2019-06-01 HISTORY — DX: Atherosclerotic heart disease of native coronary artery without angina pectoris: I25.10

## 2019-06-01 HISTORY — DX: Cervical disc disorder with myelopathy, unspecified cervical region: M50.00

## 2019-06-01 LAB — CBC WITH DIFFERENTIAL/PLATELET
Abs Immature Granulocytes: 0.02 10*3/uL (ref 0.00–0.07)
Basophils Absolute: 0.1 10*3/uL (ref 0.0–0.1)
Basophils Relative: 1 %
Eosinophils Absolute: 1.4 10*3/uL — ABNORMAL HIGH (ref 0.0–0.5)
Eosinophils Relative: 22 %
HCT: 38.2 % (ref 36.0–46.0)
Hemoglobin: 12.1 g/dL (ref 12.0–15.0)
Immature Granulocytes: 0 %
Lymphocytes Relative: 24 %
Lymphs Abs: 1.6 10*3/uL (ref 0.7–4.0)
MCH: 26.9 pg (ref 26.0–34.0)
MCHC: 31.7 g/dL (ref 30.0–36.0)
MCV: 84.9 fL (ref 80.0–100.0)
Monocytes Absolute: 0.6 10*3/uL (ref 0.1–1.0)
Monocytes Relative: 9 %
Neutro Abs: 2.9 10*3/uL (ref 1.7–7.7)
Neutrophils Relative %: 44 %
Platelets: 329 10*3/uL (ref 150–400)
RBC: 4.5 MIL/uL (ref 3.87–5.11)
RDW: 14.4 % (ref 11.5–15.5)
WBC: 6.5 10*3/uL (ref 4.0–10.5)
nRBC: 0 % (ref 0.0–0.2)

## 2019-06-01 LAB — BASIC METABOLIC PANEL
Anion gap: 8 (ref 5–15)
BUN: 9 mg/dL (ref 8–23)
CO2: 31 mmol/L (ref 22–32)
Calcium: 9.6 mg/dL (ref 8.9–10.3)
Chloride: 102 mmol/L (ref 98–111)
Creatinine, Ser: 0.51 mg/dL (ref 0.44–1.00)
GFR calc Af Amer: >60 mL/min (ref >60)
GFR calc non Af Amer: >60 mL/min (ref >60)
Glucose, Bld: 116 mg/dL — ABNORMAL HIGH (ref 70–99)
Potassium: 3.7 mmol/L (ref 3.5–5.1)
Sodium: 141 mmol/L (ref 135–145)

## 2019-06-01 LAB — BRAIN NATRIURETIC PEPTIDE: B Natriuretic Peptide: 122 pg/mL — ABNORMAL HIGH (ref 0.0–100.0)

## 2019-06-01 LAB — POC SARS CORONAVIRUS 2 AG -  ED: SARS Coronavirus 2 Ag: NEGATIVE

## 2019-06-01 LAB — TROPONIN I (HIGH SENSITIVITY): Troponin I (High Sensitivity): 9 ng/L (ref <18)

## 2019-06-01 MED ORDER — PREDNISONE 50 MG PO TABS
60.0000 mg | ORAL_TABLET | Freq: Once | ORAL | Status: AC
  Administered 2019-06-01: 60 mg via ORAL
  Filled 2019-06-01: qty 1

## 2019-06-01 MED ORDER — MAGNESIUM SULFATE 2 GM/50ML IV SOLN
2.0000 g | Freq: Once | INTRAVENOUS | Status: AC
  Administered 2019-06-01: 2 g via INTRAVENOUS
  Filled 2019-06-01: qty 50

## 2019-06-01 MED ORDER — ALBUTEROL SULFATE HFA 108 (90 BASE) MCG/ACT IN AERS
8.0000 | INHALATION_SPRAY | Freq: Once | RESPIRATORY_TRACT | Status: AC
  Administered 2019-06-01: 8 via RESPIRATORY_TRACT
  Filled 2019-06-01: qty 6.7

## 2019-06-01 MED ORDER — PREDNISONE 20 MG PO TABS: ORAL_TABLET | ORAL | 0 refills | Status: DC

## 2019-06-01 NOTE — Discharge Instructions (Addendum)
You are being tested for the novel coronavirus.  Please quarantine yourself until your test results.   Start your prednisone tomorrow.  Otherwise, use your albuterol every 4 hours for shortness of breath and cough.  If you need it more than this or you develop worsening shortness of breath or any other new/concerning symptoms then return to the ER for evaluation.

## 2019-06-01 NOTE — ED Triage Notes (Signed)
Pt reports SOB for one week and coughing. Chroninc O2 at 2.5 L. Pt reports that she has used several txs during night. Also had EMS come to house on sat and Sunday but pt refused tx

## 2019-06-01 NOTE — ED Notes (Signed)
Central cab called per pt request for a ride home- [place], pt grandaughter made aware.

## 2019-06-01 NOTE — ED Provider Notes (Signed)
[facility] EMERGENCY DEPARTMENT Provider Note   CSN: [id number] Arrival date & time: 06/01/19  [id number]     History Chief Complaint  Patient presents with  . Shortness of Breath    [name] is a 84 y.o. female.  HPI 84 year old female presents with shortness of breath.  Ongoing for about a week.  Has had a cough that feels like it stuck in her chest.  Some ongoing chest pain but this is not a new problem for her.  Is sharp.  No fevers.  She has noticed some bilateral ankle swelling that is not particularly new either.  She has been using multiple albuterol treatments without help.  She also has been having chronic back pain and wants to make sure her kidneys are checked because someone told her they need to be checked.  No urinary symptoms.   Past Medical History:  Diagnosis Date  . Anxiety   . Cervical disc disorder with myelopathy, unspecified cervical region   . Chronic systolic (congestive) heart failure ([place])   . COPD (chronic obstructive pulmonary disease) ([place])   . Coronary artery disease   . Disc disease with myelopathy, cervical   . Essential hypertension   . Hemorrhoids   . Liver mass   . Lung, cysts, congenital    Left lung cyst  . Myocardial infarction ([place])   . Nephrolithiasis    Embedded  . Nonischemic cardiomyopathy ([place])    LVEF 35-40% 2015  . On home O2   . Osteoarthritis   . Thoracic ascending aortic aneurysm (HCC)    4.3 cm April 2016    Patient Active Problem List   Diagnosis Date Noted  . Acute on chronic respiratory failure with hypoxia ([place]) 04/24/2018  . Chronic stable angina ([place]) 04/24/2018  . Physical deconditioning 04/24/2018  . CAD (coronary artery disease) 02/03/2018  . Ischemic cardiomyopathy 11/25/2017  . Dizziness 04/01/2017  . COPD (chronic obstructive pulmonary disease) ([place]) 04/01/2017  . CAD S/P percutaneous coronary angioplasty 04/01/2017  . NSTEMI (non-ST elevated myocardial infarction) ([place]) 11/23/2016  . Hypertensive  cardiovascular disease 10/03/2015  . Protein-calorie malnutrition, severe 07/13/2015  . Demand ischemia of myocardium ([place]) 07/13/2015  . Nonischemic cardiomyopathy ([place])   . Atypical chest pain   . Left bundle branch block   . COPD exacerbation ([place]) 07/11/2015  . Flu-like symptoms 07/11/2015  . Elevated troponin 07/11/2015  . Acute bronchitis 07/11/2015  . GERD (gastroesophageal reflux disease) 07/11/2015  . Bronchospasm   . Ascending aortic aneurysm ([place]) 01/27/2015  . Chronic combined systolic and diastolic CHF (congestive heart failure) ([place]) 01/27/2015  . Chest pain at rest 01/27/2015  . Essential hypertension 01/27/2015  . Anxiety 01/27/2015  . Chronic pain 01/27/2015  . Chest pain 08/20/2013  . Hypertension 08/20/2013  . Contusion of left knee 08/07/2012  . Sprain of wrist 08/07/2012  . Liver mass 05/21/2011  . Abdominal pain 05/21/2011  . Bronchitis 05/21/2011  . De Quervain's disease (tenosynovitis) 04/02/2011  . SHOULDER PAIN 01/19/2009  . CERVICALGIA 01/19/2009  . IMPINGEMENT SYNDROME 01/19/2009    Past Surgical History:  Procedure Laterality Date  . Benign breast tumors    . CHOLECYSTECTOMY    . COLONOSCOPY    . COLONOSCOPY N/A 09/22/2014   Procedure: COLONOSCOPY;  Surgeon: Rogene Houston, MD;  [facility];  Service: Endoscopy;  Laterality: N/A;  830 -- to be done in OR under fluoro  . Complete hysterectomy    . CORONARY STENT INTERVENTION N/A 11/23/2016  Procedure: Coronary Stent Intervention;  Surgeon: Martinique, Peter M, MD;  Location: [place] CV LAB;  Service: Cardiovascular;  Laterality: N/A;  . LEFT HEART CATH AND CORONARY ANGIOGRAPHY N/A 11/23/2016   Procedure: Left Heart Cath and Coronary Angiography;  Surgeon: Martinique, Peter M, MD;  Location: [place] CV LAB;  Service: Cardiovascular;  Laterality: N/A;  . TONSILLECTOMY AND ADENOIDECTOMY       OB History    Gravida  3   Para  3   Term  3   Preterm      AB      Living  2       SAB      TAB      Ectopic      Multiple      Live Births              Family History  Problem Relation Age of Onset  . Heart disease Mother   . Aneurysm Father   . Lung cancer Brother   . Heart disease Sister   . Diabetes Brother   . Heart disease Brother     Social History   Tobacco Use  . Smoking status: Former Smoker    Packs/day: 0.25    Years: 31.00    Pack years: 7.75    Types: Cigarettes    Start date: 05/14/1977    Quit date: 05/28/2008    Years since quitting: 11.0  . Smokeless tobacco: Never Used  Substance Use Topics  . Alcohol use: No    Alcohol/week: 0.0 standard drinks  . Drug use: No    Home Medications Prior to Admission medications   Medication Sig Start Date End Date Taking? Authorizing Provider  albuterol (PROVENTIL) (2.5 MG/3ML) 0.083% nebulizer solution Take 3 mLs (2.5 mg total) by nebulization every 6 (six) hours as needed for wheezing or shortness of breath. 07/13/15  Yes Sinda Du, MD  aspirin EC 81 MG tablet Take 81 mg by mouth every morning.    Yes [provider]  atorvastatin (LIPITOR) 80 MG tablet Take 1 tablet (80 mg total) by mouth daily. 03/26/19 06/24/19 Yes Herminio Commons, MD  carvedilol (COREG) 6.25 MG tablet TAKE ONE TABLET BY MOUTH 2 TIMES A DAY. 03/25/19  Yes Herminio Commons, MD  clorazepate (TRANXENE) 7.5 MG tablet Take 7.5 mg by mouth daily as needed for anxiety. For nerves   Yes [provider]  meclizine (ANTIVERT) 25 MG tablet Take 25 mg by mouth as needed for dizziness.   Yes [provider]  nitroGLYCERIN (NITROSTAT) 0.4 MG SL tablet Place 1 tablet (0.4 mg total) under the tongue every 5 (five) minutes x 3 doses as needed for chest pain. 03/26/19  Yes Herminio Commons, MD  OXYGEN Inhale 2-2.5 L into the lungs at bedtime.    Yes [provider]  PROAIR HFA 108 (90 BASE) MCG/ACT inhaler Inhale 2 puffs into the lungs every 6 (six) hours as needed for wheezing or  shortness of breath.  02/08/14  Yes [provider]  sacubitril-valsartan (ENTRESTO) 24-26 MG Take 1 tablet by mouth daily.   Yes [provider]  traMADol (ULTRAM) 50 MG tablet Take 50 mg by mouth daily as needed for moderate pain or severe pain. Maximum dose= 8 tablets per day   Yes [provider]  predniSONE (DELTASONE) 20 MG tablet 2 tabs po daily x 4 days 06/02/19   Sherwood Gambler, MD    Allergies    Plavix [clopidogrel  bisulfate], Montelukast sodium, Alprazolam, Codeine, Percodan [oxycodone-aspirin], and Valium  Review of Systems   Review of Systems  Constitutional: Negative for fever.  Respiratory: Positive for cough and shortness of breath.   Cardiovascular: Positive for chest pain.  Gastrointestinal: Negative for abdominal pain.  Musculoskeletal: Positive for back pain.  All other systems reviewed and are negative.   Physical Exam Updated Vital Signs BP (!) 139/58   Pulse 84   Temp 98 F (36.7 C) (Oral)   Resp (!) 21   SpO2 98%   Physical Exam Vitals and nursing note reviewed.  Constitutional:      General: She is not in acute distress.    Appearance: She is well-developed. She is not ill-appearing or diaphoretic.  HENT:     Head: Normocephalic and atraumatic.     Right Ear: External ear normal.     Left Ear: External ear normal.     Nose: Nose normal.  Eyes:     General:        Right eye: No discharge.        Left eye: No discharge.  Cardiovascular:     Rate and Rhythm: Normal rate and regular rhythm.     Heart sounds: Normal heart sounds.  Pulmonary:     Effort: Pulmonary effort is normal. No tachypnea, accessory muscle usage or respiratory distress.     Breath sounds: Wheezing (diffuse, expiratory) present.     Comments: Speaks in complete sentences without increased work of breathing Abdominal:     Palpations: Abdomen is soft.     Tenderness: There is no abdominal tenderness.  Musculoskeletal:     Right lower leg: No edema.       Left lower leg: No edema.  Skin:    General: Skin is warm and dry.  Neurological:     Mental Status: She is alert.  Psychiatric:        Mood and Affect: Mood is not anxious.     ED Results / Procedures / Treatments   Labs (all labs ordered are listed, but only abnormal results are displayed) Labs Reviewed  BASIC METABOLIC PANEL - Abnormal; Notable for the following components:      Result Value   Glucose, Bld 116 (*)    All other components within normal limits  CBC WITH DIFFERENTIAL/PLATELET - Abnormal; Notable for the following components:   Eosinophils Absolute 1.4 (*)    All other components within normal limits  BRAIN NATRIURETIC PEPTIDE - Abnormal; Notable for the following components:   B Natriuretic Peptide 122.0 (*)    All other components within normal limits  NOVEL CORONAVIRUS, NAA (HOSP ORDER, SEND-OUT TO REF LAB; TAT 18-24 HRS)  POC SARS CORONAVIRUS 2 AG -  ED  TROPONIN I (HIGH SENSITIVITY)    EKG EKG Interpretation  Date/Time:  Monday June 01 2019 09:04:07 EST Ventricular Rate:  85 PR Interval:    QRS Duration: 159 QT Interval:  388 QTC Calculation: 462 R Axis:   40 Text Interpretation: Sinus rhythm Left bundle branch block Confirmed by Sherwood Gambler [phone]) on 06/01/2019 9:05:42 AM   Radiology DG Chest Port 1 View  Result Date: 06/01/2019 CLINICAL DATA:  Cough, dyspnea. EXAM: PORTABLE CHEST 1 VIEW COMPARISON:  December 16, 2018. FINDINGS: Stable cardiomegaly. No pneumothorax or pleural effusion is noted. Both lungs are clear. Status post left shoulder arthroplasty. IMPRESSION: No active disease. Electronically Signed   By: Marijo Conception M.D.   On: 06/01/2019 09:48    Procedures Procedures (  including critical care time)  Medications Ordered in ED Medications  predniSONE (DELTASONE) tablet 60 mg (60 mg Oral Given 06/01/19 0953)  magnesium sulfate IVPB 2 g 50 mL (0 g Intravenous Stopped 06/01/19 1118)  albuterol (VENTOLIN HFA) 108 (90 Base)  MCG/ACT inhaler 8 puff (8 puffs Inhalation Given 06/01/19 0954)    ED Course  I have reviewed the triage vital signs and the nursing notes.  Pertinent labs & imaging results that were available during my care of the patient were reviewed by me and considered in my medical decision making (see chart for details).    MDM Rules/Calculators/A&P                      Patient is feeling better after treatment in the ED.  I think she will need steroid prescription and continued albuterol.  Most likely COPD exacerbation.  Given the current pandemic we will also send outpatient Covid testing.  Highly doubt ACS, PE.  Does not appear to be pneumonia.  Discharged with return precautions.  [name] was evaluated in Emergency Department on 06/01/2019 for the symptoms described in the history of present illness. She was evaluated in the context of the global COVID-19 pandemic, which necessitated consideration that the patient might be at risk for infection with the SARS-CoV-2 virus that causes COVID-19. Institutional protocols and algorithms that pertain to the evaluation of patients at risk for COVID-19 are in a state of rapid change based on information released by regulatory bodies including the CDC and federal and state organizations. These policies and algorithms were followed during the patient's care in the ED.  Final Clinical Impression(s) / ED Diagnoses Final diagnoses:  COPD exacerbation ([place])    Rx / DC Orders ED Discharge Orders         Ordered    predniSONE (DELTASONE) 20 MG tablet     06/01/19 1204           Sherwood Gambler, MD 06/01/19 1205

## 2019-06-02 LAB — NOVEL CORONAVIRUS, NAA (HOSP ORDER, SEND-OUT TO REF LAB; TAT 18-24 HRS): SARS-CoV-2, NAA: NOT DETECTED

## 2019-06-18 DIAGNOSIS — Z9981 Dependence on supplemental oxygen: Secondary | ICD-10-CM | POA: Diagnosis not present

## 2019-06-18 DIAGNOSIS — J441 Chronic obstructive pulmonary disease with (acute) exacerbation: Secondary | ICD-10-CM | POA: Diagnosis not present

## 2019-06-22 DIAGNOSIS — J441 Chronic obstructive pulmonary disease with (acute) exacerbation: Secondary | ICD-10-CM | POA: Diagnosis not present

## 2019-06-22 DIAGNOSIS — Z9981 Dependence on supplemental oxygen: Secondary | ICD-10-CM | POA: Diagnosis not present

## 2019-07-13 ENCOUNTER — Encounter: Payer: Self-pay | Admitting: Emergency Medicine

## 2019-07-13 ENCOUNTER — Ambulatory Visit (INDEPENDENT_AMBULATORY_CARE_PROVIDER_SITE_OTHER): Payer: Medicare Other | Admitting: Emergency Medicine

## 2019-07-13 ENCOUNTER — Other Ambulatory Visit: Payer: Self-pay

## 2019-07-13 VITALS — BP 150/72 | HR 79 | Temp 97.7°F | Ht 63.0 in | Wt 164.0 lb

## 2019-07-13 DIAGNOSIS — J432 Centrilobular emphysema: Secondary | ICD-10-CM | POA: Diagnosis not present

## 2019-07-13 DIAGNOSIS — R0602 Shortness of breath: Secondary | ICD-10-CM

## 2019-07-13 MED ORDER — TRELEGY ELLIPTA 200-62.5-25 MCG/INH IN AEPB: 1.0000 | INHALATION_SPRAY | Freq: Every day | RESPIRATORY_TRACT | 0 refills | Status: DC

## 2019-07-13 NOTE — Patient Instructions (Addendum)
Please stop your Symbicort for now. We will try starting Trelegy 1 inhalation once daily.  Rinse and gargle after you use this.  Take this every day at the same time.  This will be your maintenance inhaler medicine. We will restart your albuterol nebulizer treatments.  Use 1 nebulizer treatment up to every 4 hours if you need it for shortness of breath, chest tightness, wheezing. You can stop using the albuterol/ipratropium nebulizer (DuoNeb) since you did not feel benefit from this. Please try to use your oxygen more reliably at 2 L/min with all exertion. You might benefit from other medications that will help prevent the frequency of breathing flareups.  We can talk about this in the future. Follow with Dr. Lamonte Sakai in 1 months or sooner if you have any problems.  We will work on setting you up with one of our partner physicians who will be seeing patients in [place].

## 2019-07-13 NOTE — Assessment & Plan Note (Signed)
Somewhat poor historian but clearly she has had frequent exacerbations over the last year.  She may at some point become a candidate for Daliresp, possibly even chronic low-dose prednisone.  Before committing to those things I would like to try and solidify her bronchodilator regimen.  For some reason she did not get much benefit from [facility] given to her recently by her FNP, prefers albuterol alone.  I will change her to Trelegy, stop Symbicort and change the nebulizer back to albuterol as needed.  Encouraged her to use her oxygen more reliably.  She will need pulmonary function testing at some point to requantify her degree of obstruction.  Would like for her to be on a stable bronchodilator regimen first.  Ultimately she would like to follow-up in [place] and I will work on arranging this once our clinic is well-established there.  Please stop your Symbicort for now. We will try starting Trelegy 1 inhalation once daily.  Rinse and gargle after you use this.  Take this every day at the same time.  This will be your maintenance inhaler medicine. We will restart your albuterol nebulizer treatments.  Use 1 nebulizer treatment up to every 4 hours if you need it for shortness of breath, chest tightness, wheezing. You can stop using the albuterol/ipratropium nebulizer (DuoNeb) since you did not feel benefit from this. Please try to use your oxygen more reliably at 2 L/min with all exertion. You might benefit from other medications that will help prevent the frequency of breathing flareups.  We can talk about this in the future. Follow with Dr. Lamonte Sakai in 1 months or sooner if you have any problems.  We will work on setting you up with one of our partner physicians who will be seeing patients in [place].

## 2019-07-13 NOTE — Progress Notes (Signed)
Subjective:    Patient ID: [name], female    DOB: 08/09/1935, 84 y.o.   MRN: [id number]  HPI This is a new consultation for 84 year old woman, former smoker (~20 pack years) who is been followed by Dr. Luan Pulling for COPD.  Also with a history of congenital left lung cyst, hypertension, CAD with cardiomyopathy, cervical disc disease, hypoxemic respiratory failure in the setting of all the above.  Currently managed on Symbicort, albuterol which she uses about . She was recently changed from albuterol nebs to duoneb > she does not believe this helps her as much.   She experiences daily wheezing, exertional SOB. She has O2, uses it only when she feels she needs it. She is still able to do some household chores. She says she has flared 4-5x in the last 6 months, last was a month ago. She coughs some but not every day. No sputum.    PFT 08/31/2011 reviewed by me, shows moderately severe AFL without BD response, hyperinflated volumes, decrease DLCO that corrects for VA   Review of Systems  Constitutional: Negative for fever and unexpected weight change.  HENT: Positive for trouble swallowing. Negative for congestion, dental problem, ear pain, nosebleeds, postnasal drip, rhinorrhea, sinus pressure, sneezing and sore throat.   Eyes: Negative for redness and itching.  Respiratory: Positive for chest tightness, shortness of breath and wheezing. Negative for cough.   Cardiovascular: Positive for palpitations. Negative for leg swelling.  Gastrointestinal: Negative for nausea and vomiting.  Genitourinary: Negative for dysuria.  Musculoskeletal: Negative for joint swelling.  Skin: Negative for rash.  Allergic/Immunologic: Negative.  Negative for environmental allergies, food allergies and immunocompromised state.  Neurological: Negative for headaches.  Hematological: Does not bruise/bleed easily.  Psychiatric/Behavioral: Negative for dysphoric mood. The patient is not nervous/anxious.    Past  Medical History:  Diagnosis Date  . Anxiety   . Cervical disc disorder with myelopathy, unspecified cervical region   . Chronic systolic (congestive) heart failure ([place])   . COPD (chronic obstructive pulmonary disease) ([place])   . Coronary artery disease   . Disc disease with myelopathy, cervical   . Essential hypertension   . Hemorrhoids   . Liver mass   . Lung, cysts, congenital    Left lung cyst  . Myocardial infarction ([place])   . Nephrolithiasis    Embedded  . Nonischemic cardiomyopathy ([place])    LVEF 35-40% 2015  . On home O2   . Osteoarthritis   . Thoracic ascending aortic aneurysm (H[facility])    4.3 cm April 2016     Family History  Problem Relation Age of Onset  . Heart disease Mother   . Aneurysm Father   . Lung cancer Brother   . Heart disease Sister   . Diabetes Brother   . Heart disease Brother      Social History   Socioeconomic History  . Marital status: Widowed    Spouse name: Not on file  . Number of children: Not on file  . Years of education: 9th  . Highest education level: Not on file  Occupational History    Employer: RETIRED  Tobacco Use  . Smoking status: Former Smoker    Packs/day: 0.25    Years: 31.00    Pack years: 7.75    Types: Cigarettes    Start date: 05/14/1977    Quit date: 05/28/2008    Years since quitting: 11.1  . Smokeless tobacco: Never Used  Substance and Sexual Activity  . Alcohol use:  No    Alcohol/week: 0.0 standard drinks  . Drug use: No  . Sexual activity: Never  Other Topics Concern  . Not on file  Social History Narrative  . Not on file   Social Determinants of Health   Financial Resource Strain:   . Difficulty of Paying Living Expenses: Not on file  Food Insecurity:   . Worried About Charity fundraiser in the Last Year: Not on file  . Ran Out of Food in the Last Year: Not on file  Transportation Needs:   . Lack of Transportation (Medical): Not on file  . Lack of Transportation (Non-Medical): Not on file    Physical Activity:   . Days of Exercise per Week: Not on file  . Minutes of Exercise per Session: Not on file  Stress:   . Feeling of Stress : Not on file  Social Connections:   . Frequency of Communication with Friends and Family: Not on file  . Frequency of Social Gatherings with Friends and Family: Not on file  . Attends Religious Services: Not on file  . Active Member of Clubs or Organizations: Not on file  . Attends Archivist Meetings: Not on file  . Marital Status: Not on file  Intimate Partner Violence:   . Fear of Current or Ex-Partner: Not on file  . Emotionally Abused: Not on file  . Physically Abused: Not on file  . Sexually Abused: Not on file     Allergies  Allergen Reactions  . Plavix [Clopidogrel Bisulfate] Itching    Severe itching  . Montelukast Sodium     Pt states she felt a "headache and her eyes started to Burn "   . Alprazolam Nausea And Vomiting  . Codeine Nausea And Vomiting  . Percodan [Oxycodone-Aspirin] Nausea And Vomiting  . Valium Nausea And Vomiting     Outpatient Medications Prior to Visit  Medication Sig Dispense Refill  . albuterol (PROVENTIL) (2.5 MG/3ML) 0.083% nebulizer solution Take 3 mLs (2.5 mg total) by nebulization every 6 (six) hours as needed for wheezing or shortness of breath. 75 mL 12  . aspirin EC 81 MG tablet Take 81 mg by mouth every morning.     . carvedilol (COREG) 6.25 MG tablet TAKE ONE TABLET BY MOUTH 2 TIMES A DAY. 180 tablet 0  . clorazepate (TRANXENE) 7.5 MG tablet Take 7.5 mg by mouth daily as needed for anxiety. For nerves    . meclizine (ANTIVERT) 25 MG tablet Take 25 mg by mouth as needed for dizziness.    . nitroGLYCERIN (NITROSTAT) 0.4 MG SL tablet Place 1 tablet (0.4 mg total) under the tongue every 5 (five) minutes x 3 doses as needed for chest pain. 25 tablet 3  . OXYGEN Inhale 2-2.5 L into the lungs at bedtime.     Marland Kitchen PROAIR HFA 108 (90 BASE) MCG/ACT inhaler Inhale 2 puffs into the lungs every 6  (six) hours as needed for wheezing or shortness of breath.     . sacubitril-valsartan (ENTRESTO) 24-26 MG Take 1 tablet by mouth daily.    . traMADol (ULTRAM) 50 MG tablet Take 50 mg by mouth daily as needed for moderate pain or severe pain. Maximum dose= 8 tablets per day    . predniSONE (DELTASONE) 20 MG tablet 2 tabs po daily x 4 days 8 tablet 0  . atorvastatin (LIPITOR) 80 MG tablet Take 1 tablet (80 mg total) by mouth daily.     No facility-administered medications prior to  visit.        Objective:   Physical Exam Vitals:   07/13/19 1430  BP: (!) 150/72  Pulse: 79  Temp: 97.7 F (36.5 C)  TempSrc: Temporal  SpO2: 95%  Weight: 164 lb (74.4 kg)  Height: 5\' 3"  (1.6 m)  Gen: Pleasant, elderly woman, somewhat poor historian, in no distress,  normal affect  ENT: No lesions,  mouth clear,  oropharynx clear, no postnasal drip  Neck: No JVD, no stridor  Lungs: No use of accessory muscles, very distant, she has bilateral end expiratory wheezes left greater than right  Cardiovascular: RRR, heart sounds normal, no murmur or gallops, 1+ left pretibial edema, trace on the right  Musculoskeletal: No deformities, no cyanosis or clubbing  Neuro: alert, awake, non focal  Skin: Warm, no lesions or rash     Assessment & Plan:  COPD (chronic obstructive pulmonary disease) (HCC) Somewhat poor historian but clearly she has had frequent exacerbations over the last year.  She may at some point become a candidate for Daliresp, possibly even chronic low-dose prednisone.  Before committing to those things I would like to try and solidify her bronchodilator regimen.  For some reason she did not get much benefit from [facility] given to her recently by her FNP, prefers albuterol alone.  I will change her to Trelegy, stop Symbicort and change the nebulizer back to albuterol as needed.  Encouraged her to use her oxygen more reliably.  She will need pulmonary function testing at some point to requantify  her degree of obstruction.  Would like for her to be on a stable bronchodilator regimen first.  Ultimately she would like to follow-up in [place] and I will work on arranging this once our clinic is well-established there.  Please stop your Symbicort for now. We will try starting Trelegy 1 inhalation once daily.  Rinse and gargle after you use this.  Take this every day at the same time.  This will be your maintenance inhaler medicine. We will restart your albuterol nebulizer treatments.  Use 1 nebulizer treatment up to every 4 hours if you need it for shortness of breath, chest tightness, wheezing. You can stop using the albuterol/ipratropium nebulizer (DuoNeb) since you did not feel benefit from this. Please try to use your oxygen more reliably at 2 L/min with all exertion. You might benefit from other medications that will help prevent the frequency of breathing flareups.  We can talk about this in the future. Follow with Dr. Lamonte Sakai in 1 months or sooner if you have any problems.  We will work on setting you up with one of our partner physicians who will be seeing patients in [place].  Baltazar Apo, MD, PhD 07/13/2019, 3:03 PM [facility] [phone] or if no answer [phone]

## 2019-07-23 ENCOUNTER — Telehealth: Payer: Self-pay | Admitting: Emergency Medicine

## 2019-07-23 ENCOUNTER — Other Ambulatory Visit: Payer: Self-pay | Admitting: Cardiovascular Disease

## 2019-07-23 NOTE — Telephone Encounter (Signed)
ATC pt, no answer. Left message for pt to call back.  I need to clarification on which medication she is referring to.

## 2019-07-23 NOTE — Telephone Encounter (Signed)
  Spoke with pt, advised her that Dr. Lamonte Sakai wanted her rto keep taking the Trelegy. I advised her that RB wanted her to use this as her maintenance medication. She reports doing great on the medicine and it is making her feel good, less SOB. She has an appt to see RB on 4/7. Pt understood and nothing further is needed.   Patient Instructions by Collene Gobble, MD at 07/13/2019 2:30 PM Author: Collene Gobble, MD Author Type: Physician Filed: 07/13/2019 3:01 PM  Note Status: Addendum Cosign: Cosign Not Required Encounter Date: 07/13/2019  Editor: Collene Gobble, MD (Physician)  Prior Versions: 1. Collene Gobble, MD (Physician) at 07/13/2019 3:01 PM - Signed    Please stop your Symbicort for now. We will try starting Trelegy 1 inhalation once daily.  Rinse and gargle after you use this.  Take this every day at the same time.  This will be your maintenance inhaler medicine. We will restart your albuterol nebulizer treatments.  Use 1 nebulizer treatment up to every 4 hours if you need it for shortness of breath, chest tightness, wheezing. You can stop using the albuterol/ipratropium nebulizer (DuoNeb) since you did not feel benefit from this. Please try to use your oxygen more reliably at 2 L/min with all exertion. You might benefit from other medications that will help prevent the frequency of breathing flareups.  We can talk about this in the future. Follow with Dr. Lamonte Sakai in 1 months or sooner if you have any problems.  We will work on setting you up with one of our partner physicians who will be seeing patients in [place].

## 2019-07-23 NOTE — Telephone Encounter (Signed)
Pt called back and said she wanted to specify the medication she is talking about it trelogy.

## 2019-07-23 NOTE — Telephone Encounter (Signed)
Continued...   can leave a detailed message on the machine because she is getting ready to go out for a while.

## 2019-08-03 ENCOUNTER — Telehealth: Payer: Self-pay | Admitting: Emergency Medicine

## 2019-08-03 MED ORDER — TRELEGY ELLIPTA 200-62.5-25 MCG/INH IN AEPB: 1.0000 | INHALATION_SPRAY | Freq: Every day | RESPIRATORY_TRACT | 1 refills | Status: DC

## 2019-08-03 NOTE — Telephone Encounter (Signed)
Called and spoke with Patient.  Patient stated Trelegy was working great for her, and requested a prescription to be sent to [facility].   Prescription sent.  Nothing further at this time.

## 2019-08-19 ENCOUNTER — Ambulatory Visit (INDEPENDENT_AMBULATORY_CARE_PROVIDER_SITE_OTHER): Payer: Medicare Other | Admitting: Emergency Medicine

## 2019-08-19 ENCOUNTER — Other Ambulatory Visit: Payer: Self-pay

## 2019-08-19 ENCOUNTER — Encounter: Payer: Self-pay | Admitting: Emergency Medicine

## 2019-08-19 DIAGNOSIS — J432 Centrilobular emphysema: Secondary | ICD-10-CM | POA: Diagnosis not present

## 2019-08-19 NOTE — Progress Notes (Signed)
Virtual Visit via Telephone Note  I connected with [name] on 08/19/19 at  1:45 PM EDT by telephone and verified that I am speaking with the correct person using two identifiers.  Location: Patient: Home Provider: Office   I discussed the limitations, risks, security and privacy concerns of performing an evaluation and management service by telephone and the availability of in person appointments. I also discussed with the patient that there may be a patient responsible charge related to this service. The patient expressed understanding and agreed to proceed.   History of Present Illness: Follow-up visit for 84 year old former smoker who been followed by Dr. Luan Pulling for COPD.  Also with a history of congenital left lung cyst, hypertension, CAD with cardiomyopathy.  She has hypoxemic respiratory failure in the setting of all the above.  Her PFTs show moderately severe obstruction without a bronchodilator response from 08/31/2011.  She has chronic bronchitic symptoms, frequent flares.  May ultimately require prednisone, Daliresp   Observations/Objective: I changed her Symbicort to Trelegy 1 month ago to see if she would get more benefit.  I also recommended that she use her oxygen more reliably at 2 L/min with exertion. Today she reports that she has improved significantly - better overall breathing, better exertional tolerance. Her wheeze is much improved. Her cough and mucous production are also improved. She has not needed her albuterol. She is on Singulair - has been on it a year, wonders whether she should continue.   Assessment and Plan: Continue Trelegy + albuterol prn  O2 at 2L/min when sleeping and w exertion  Plan to stop singulair and see if she tolerates.   Follow Up Instructions: She wants to follow in [place] when we have our office running there.     I discussed the assessment and treatment plan with the patient. The patient was provided an opportunity to ask  questions and all were answered. The patient agreed with the plan and demonstrated an understanding of the instructions.   The patient was advised to call back or seek an in-person evaluation if the symptoms worsen or if the condition fails to improve as anticipated.  I provided 15 minutes of non-face-to-face time during this encounter.   Collene Gobble, MD

## 2019-08-22 ENCOUNTER — Emergency Department (HOSPITAL_COMMUNITY): Payer: Medicare Other

## 2019-08-22 ENCOUNTER — Other Ambulatory Visit: Payer: Self-pay

## 2019-08-22 ENCOUNTER — Emergency Department (HOSPITAL_COMMUNITY)
Admission: EM | Admit: 2019-08-22 | Discharge: 2019-08-22 | Disposition: A | Discharge status: Home / Self Care | Payer: Medicare Other | Attending: Emergency Medicine | Admitting: Emergency Medicine

## 2019-08-22 ENCOUNTER — Encounter (HOSPITAL_COMMUNITY): Payer: Self-pay | Admitting: *Deleted

## 2019-08-22 DIAGNOSIS — R531 Weakness: Secondary | ICD-10-CM | POA: Diagnosis not present

## 2019-08-22 DIAGNOSIS — Z79899 Other long term (current) drug therapy: Secondary | ICD-10-CM | POA: Diagnosis not present

## 2019-08-22 DIAGNOSIS — M545 Low back pain: Secondary | ICD-10-CM | POA: Diagnosis not present

## 2019-08-22 DIAGNOSIS — I5042 Chronic combined systolic (congestive) and diastolic (congestive) heart failure: Secondary | ICD-10-CM | POA: Insufficient documentation

## 2019-08-22 DIAGNOSIS — I110 Hypertensive heart disease with heart failure: Secondary | ICD-10-CM | POA: Insufficient documentation

## 2019-08-22 DIAGNOSIS — Z87891 Personal history of nicotine dependence: Secondary | ICD-10-CM | POA: Insufficient documentation

## 2019-08-22 DIAGNOSIS — Z7982 Long term (current) use of aspirin: Secondary | ICD-10-CM | POA: Insufficient documentation

## 2019-08-22 DIAGNOSIS — I2510 Atherosclerotic heart disease of native coronary artery without angina pectoris: Secondary | ICD-10-CM | POA: Insufficient documentation

## 2019-08-22 DIAGNOSIS — M7918 Myalgia, other site: Secondary | ICD-10-CM | POA: Insufficient documentation

## 2019-08-22 DIAGNOSIS — M791 Myalgia, unspecified site: Secondary | ICD-10-CM

## 2019-08-22 DIAGNOSIS — R52 Pain, unspecified: Secondary | ICD-10-CM | POA: Diagnosis not present

## 2019-08-22 DIAGNOSIS — J449 Chronic obstructive pulmonary disease, unspecified: Secondary | ICD-10-CM | POA: Diagnosis not present

## 2019-08-22 DIAGNOSIS — I959 Hypotension, unspecified: Secondary | ICD-10-CM | POA: Diagnosis not present

## 2019-08-22 LAB — BASIC METABOLIC PANEL
Anion gap: 9 (ref 5–15)
BUN: 12 mg/dL (ref 8–23)
CO2: 28 mmol/L (ref 22–32)
Calcium: 9.5 mg/dL (ref 8.9–10.3)
Chloride: 102 mmol/L (ref 98–111)
Creatinine, Ser: 0.54 mg/dL (ref 0.44–1.00)
GFR calc Af Amer: >60 mL/min (ref >60)
GFR calc non Af Amer: >60 mL/min (ref >60)
Glucose, Bld: 95 mg/dL (ref 70–99)
Potassium: 3.8 mmol/L (ref 3.5–5.1)
Sodium: 139 mmol/L (ref 135–145)

## 2019-08-22 LAB — CBC WITH DIFFERENTIAL/PLATELET
Abs Immature Granulocytes: 0.02 10*3/uL (ref 0.00–0.07)
Basophils Absolute: 0 10*3/uL (ref 0.0–0.1)
Basophils Relative: 0 %
Eosinophils Absolute: 0.4 10*3/uL (ref 0.0–0.5)
Eosinophils Relative: 4 %
HCT: 35.8 % — ABNORMAL LOW (ref 36.0–46.0)
Hemoglobin: 11.1 g/dL — ABNORMAL LOW (ref 12.0–15.0)
Immature Granulocytes: 0 %
Lymphocytes Relative: 22 %
Lymphs Abs: 1.8 10*3/uL (ref 0.7–4.0)
MCH: 26 pg (ref 26.0–34.0)
MCHC: 31 g/dL (ref 30.0–36.0)
MCV: 83.8 fL (ref 80.0–100.0)
Monocytes Absolute: 1 10*3/uL (ref 0.1–1.0)
Monocytes Relative: 13 %
Neutro Abs: 5 10*3/uL (ref 1.7–7.7)
Neutrophils Relative %: 61 %
Platelets: 301 10*3/uL (ref 150–400)
RBC: 4.27 MIL/uL (ref 3.87–5.11)
RDW: 14.6 % (ref 11.5–15.5)
WBC: 8.2 10*3/uL (ref 4.0–10.5)
nRBC: 0 % (ref 0.0–0.2)

## 2019-08-22 MED ORDER — ONDANSETRON HCL 4 MG/2ML IJ SOLN
4.0000 mg | Freq: Once | INTRAMUSCULAR | Status: AC
  Administered 2019-08-22: 4 mg via INTRAVENOUS
  Filled 2019-08-22: qty 2

## 2019-08-22 MED ORDER — HYDROMORPHONE HCL 1 MG/ML IJ SOLN
0.5000 mg | Freq: Once | INTRAMUSCULAR | Status: AC
  Administered 2019-08-22: 0.5 mg via INTRAVENOUS
  Filled 2019-08-22: qty 1

## 2019-08-22 MED ORDER — SODIUM CHLORIDE 0.9 % IV SOLN: INTRAVENOUS | Status: DC

## 2019-08-22 NOTE — ED Triage Notes (Signed)
Patient presents to the ED via [facility] due to increased weakness "all over" generalized pain and states she "no longer can do for herself".  Patient complains of bilateral leg pain with no known injury.  Patient lives at home alone.

## 2019-08-22 NOTE — ED Notes (Signed)
Patient ambulated with walker with minimal assistance.  No complaints of pain or shortness of breath.  Patient sitting in bed, requested a cup of coffee.

## 2019-08-22 NOTE — ED Notes (Signed)
Beemer Biggs(daughter) [phone], who will come to pick up Pt.

## 2019-08-22 NOTE — Discharge Instructions (Addendum)
Take the tramadol that you have at home for pain.  Keep an appointment to follow-up with your doctor.  I think you are going to need some help from the tramadol to help with the body aches.  Work-up here today without any significant abnormalities other than that the degenerative changes in your lumbar back are definitely getting worse and could explain some of the pain and discomfort in that area.

## 2019-08-22 NOTE — ED Provider Notes (Signed)
[facility] EMERGENCY DEPARTMENT Provider Note   CSN: [id number] Arrival date & time: 08/22/19  [id number]     History Chief Complaint  Patient presents with  . Weakness    [name] is a 84 y.o. female.  Patient brought in by EMS.  Patient called because of hurting all over.  And feeling generalized weakness.  She had to take tramadol that she has at home she took some overnight said it did not help much.  She does not like taking any kind of pain medicine.  She is got complaints of basically pain in the upper thighs and in both arms no fall or injury.  Patient is currently at home.  Sometimes her granddaughter comes to stay with her but she is on vacation at the moment.  Patient does not want to go to a nursing home.        Past Medical History:  Diagnosis Date  . Anxiety   . Cervical disc disorder with myelopathy, unspecified cervical region   . Chronic systolic (congestive) heart failure ([place])   . COPD (chronic obstructive pulmonary disease) ([place])   . Coronary artery disease   . Disc disease with myelopathy, cervical   . Essential hypertension   . Hemorrhoids   . Liver mass   . Lung, cysts, congenital    Left lung cyst  . Myocardial infarction ([place])   . Nephrolithiasis    Embedded  . Nonischemic cardiomyopathy ([place])    LVEF 35-40% 2015  . On home O2   . Osteoarthritis   . Thoracic ascending aortic aneurysm ([facility])    4.3 cm April 2016    Patient Active Problem List   Diagnosis Date Noted  . Acute on chronic respiratory failure with hypoxia ([place]) 04/24/2018  . Chronic stable angina ([place]) 04/24/2018  . Physical deconditioning 04/24/2018  . CAD (coronary artery disease) 02/03/2018  . Ischemic cardiomyopathy 11/25/2017  . Dizziness 04/01/2017  . COPD (chronic obstructive pulmonary disease) ([place]) 04/01/2017  . CAD S/P percutaneous coronary angioplasty 04/01/2017  . NSTEMI (non-ST elevated myocardial infarction) ([place]) 11/23/2016  . Hypertensive cardiovascular  disease 10/03/2015  . Protein-calorie malnutrition, severe 07/13/2015  . Demand ischemia of myocardium ([place]) 07/13/2015  . Nonischemic cardiomyopathy ([place])   . Atypical chest pain   . Left bundle branch block   . Flu-like symptoms 07/11/2015  . Elevated troponin 07/11/2015  . Acute bronchitis 07/11/2015  . GERD (gastroesophageal reflux disease) 07/11/2015  . Bronchospasm   . Ascending aortic aneurysm ([place]) 01/27/2015  . Chronic combined systolic and diastolic CHF (congestive heart failure) ([place]) 01/27/2015  . Chest pain at rest 01/27/2015  . Essential hypertension 01/27/2015  . Anxiety 01/27/2015  . Chronic pain 01/27/2015  . Chest pain 08/20/2013  . Hypertension 08/20/2013  . Contusion of left knee 08/07/2012  . Sprain of wrist 08/07/2012  . Liver mass 05/21/2011  . Abdominal pain 05/21/2011  . Bronchitis 05/21/2011  . De Quervain's disease (tenosynovitis) 04/02/2011  . SHOULDER PAIN 01/19/2009  . CERVICALGIA 01/19/2009  . IMPINGEMENT SYNDROME 01/19/2009    Past Surgical History:  Procedure Laterality Date  . Benign breast tumors    . CHOLECYSTECTOMY    . COLONOSCOPY    . COLONOSCOPY N/A 09/22/2014   Procedure: COLONOSCOPY;  Surgeon: Rogene Houston, MD;  Location: [facility];  Service: Endoscopy;  Laterality: N/A;  830 -- to be done in OR under fluoro  . Complete hysterectomy    . CORONARY STENT INTERVENTION N/A 11/23/2016   Procedure:  Coronary Stent Intervention;  Surgeon: Martinique, Peter M, MD;  Location: [place] [facility];  Service: Cardiovascular;  Laterality: N/A;  . LEFT HEART CATH AND CORONARY ANGIOGRAPHY N/A 11/23/2016   Procedure: Left Heart Cath and Coronary Angiography;  Surgeon: Martinique, Peter M, MD;  Location: [place] [facility];  Service: Cardiovascular;  Laterality: N/A;  . TONSILLECTOMY AND ADENOIDECTOMY       OB History    Gravida  3   Para  3   Term  3   Preterm      AB      Living  2     SAB      TAB      Ectopic      Multiple       Live Births              Family History  Problem Relation Age of Onset  . Heart disease Mother   . Aneurysm Father   . Lung cancer Brother   . Heart disease Sister   . Diabetes Brother   . Heart disease Brother     Social History   Tobacco Use  . Smoking status: Former Smoker    Packs/day: 0.25    Years: 31.00    Pack years: 7.75    Types: Cigarettes    Start date: 05/14/1977    Quit date: 05/28/2008    Years since quitting: 11.2  . Smokeless tobacco: Never Used  Substance Use Topics  . Alcohol use: No    Alcohol/week: 0.0 standard drinks  . Drug use: No    Home Medications Prior to Admission medications   Medication Sig Start Date End Date Taking? Authorizing Provider  albuterol (PROVENTIL) (2.5 MG/3ML) 0.083% nebulizer solution Take 3 mLs (2.5 mg total) by nebulization every 6 (six) hours as needed for wheezing or shortness of breath. 07/13/15  Yes Sinda Du, MD  aspirin EC 81 MG tablet Take 81 mg by mouth every morning.    Yes [provider]  atorvastatin (LIPITOR) 80 MG tablet Take 1 tablet (80 mg total) by mouth daily. 03/26/19 08/22/19 Yes Herminio Commons, MD  carvedilol (COREG) 6.25 MG tablet TAKE ONE TABLET BY MOUTH 2 TIMES A DAY. 03/25/19  Yes Herminio Commons, MD  clorazepate (TRANXENE) 7.5 MG tablet Take 7.5 mg by mouth daily as needed for anxiety. For nerves   Yes [provider]  ENTRESTO 24-26 MG TAKE 1 TABLET BY MOUTH TWICE DAILY. 07/23/19  Yes Herminio Commons, MD  Fluticasone-Umeclidin-Vilant (TRELEGY ELLIPTA) 200-62.5-25 MCG/INH AEPB Inhale 1 puff into the lungs daily. 08/03/19  Yes Collene Gobble, MD  ipratropium-albuterol (DUONEB) 0.5-2.5 (3) MG/3ML SOLN Take 3 mLs by nebulization every 6 (six) hours as needed. 07/08/19  Yes [provider]  meclizine (ANTIVERT) 25 MG tablet Take 25 mg by mouth as needed for dizziness.   Yes [provider]  nitroGLYCERIN (NITROSTAT) 0.4 MG SL tablet Place 1 tablet (0.4  mg total) under the tongue every 5 (five) minutes x 3 doses as needed for chest pain. 03/26/19  Yes Herminio Commons, MD  OXYGEN Inhale 2-2.5 L into the lungs at bedtime.    Yes [provider]  PROAIR HFA 108 (90 BASE) MCG/ACT inhaler Inhale 2 puffs into the lungs every 6 (six) hours as needed for wheezing or shortness of breath.  02/08/14  Yes [provider]  traMADol (ULTRAM) 50 MG tablet Take 50 mg by mouth daily as needed for moderate  pain or severe pain. Maximum dose= 8 tablets per day   Yes [provider]    Allergies    Plavix [clopidogrel bisulfate], Montelukast sodium, Alprazolam, Codeine, Percodan [oxycodone-aspirin], and Valium  Review of Systems   Review of Systems  Constitutional: Negative for chills and fever.  HENT: Negative for congestion, rhinorrhea and sore throat.   Eyes: Negative for visual disturbance.  Respiratory: Negative for cough and shortness of breath.   Cardiovascular: Negative for chest pain and leg swelling.  Gastrointestinal: Negative for abdominal pain, diarrhea, nausea and vomiting.  Genitourinary: Negative for dysuria.  Musculoskeletal: Positive for back pain and myalgias. Negative for neck pain.  Skin: Negative for rash.  Neurological: Positive for weakness. Negative for dizziness, light-headedness and headaches.  Hematological: Does not bruise/bleed easily.  Psychiatric/Behavioral: Negative for confusion.    Physical Exam Updated Vital Signs BP (!) 123/50   Pulse 76   Temp 98.4 F (36.9 C) (Oral)   Resp 17   Ht 1.575 m (5\' 2" )   Wt 73.5 kg   SpO2 95%   BMI 29.63 kg/m   Physical Exam Vitals and nursing note reviewed.  Constitutional:      General: She is not in acute distress.    Appearance: Normal appearance. She is well-developed.  HENT:     Head: Normocephalic and atraumatic.  Eyes:     Extraocular Movements: Extraocular movements intact.     Conjunctiva/sclera: Conjunctivae normal.     Pupils:  Pupils are equal, round, and reactive to light.  Cardiovascular:     Rate and Rhythm: Normal rate and regular rhythm.     Heart sounds: No murmur.  Pulmonary:     Effort: Pulmonary effort is normal. No respiratory distress.     Breath sounds: Normal breath sounds.  Abdominal:     Palpations: Abdomen is soft.     Tenderness: There is no abdominal tenderness.  Musculoskeletal:        General: No swelling. Normal range of motion.     Cervical back: Normal range of motion and neck supple.  Skin:    General: Skin is warm and dry.     Capillary Refill: Capillary refill takes less than 2 seconds.  Neurological:     General: No focal deficit present.     Mental Status: She is alert and oriented to person, place, and time.     Cranial Nerves: No cranial nerve deficit.     Sensory: No sensory deficit.     Motor: No weakness.     ED Results / Procedures / Treatments   Labs (all labs ordered are listed, but only abnormal results are displayed) Labs Reviewed  CBC WITH DIFFERENTIAL/PLATELET - Abnormal; Notable for the following components:      Result Value   Hemoglobin 11.1 (*)    HCT 35.8 (*)    All other components within normal limits  BASIC METABOLIC PANEL    EKG None  Radiology DG Lumbar Spine Complete  Result Date: 08/22/2019 CLINICAL DATA:  Back and leg pain, no injury, lower back pain. EXAM: LUMBAR SPINE - COMPLETE 4+ VIEW COMPARISON:  02/12/2017 FINDINGS: Extensive degenerative changes without signs of acute fracture. T12-L1 with moderate disc space narrowing and large anterior osteophyte. Preservation of disc space at L1-L2. Marked disc space narrowing, worse than on the prior study at L3-L4. Near complete loss of the disc space with extensive sclerosis at L4-L5. Facet hypertrophy in the lower lumbar spine worse than on the prior study at L4-5  and L5-S1. IMPRESSION: 1. Worsening degenerative changes in the lumbar spine since the prior study. No signs of acute fracture or  malalignment. 2. Worsening disc space narrowing at L3-L4 and L4-L5. Electronically Signed   By: Zetta Bills M.D.   On: 08/22/2019 12:02    Procedures Procedures (including critical care time)  Medications Ordered in ED Medications  0.9 %  sodium chloride infusion ( Intravenous New Bag/Given 08/22/19 0940)  ondansetron (ZOFRAN) injection 4 mg (4 mg Intravenous Given 08/22/19 0941)  HYDROmorphone (DILAUDID) injection 0.5 mg (0.5 mg Intravenous Given 08/22/19 0941)    ED Course  I have reviewed the triage vital signs and the nursing notes.  Pertinent labs & imaging results that were available during my care of the patient were reviewed by me and considered in my medical decision making (see chart for details).    MDM Rules/Calculators/A&P                      Patient work-up CBC basic metabolic panel without any significant abnormalities.  X-ray studies of the lumbar spine shows worsening degenerative changes.  But no acute fractures.  Patient treated with some antinausea medicine and some pain medicine and feels much better.  Patient ambulated with a walker fine in the emergency department.  Patient uses a walker to ambulate at home.  Patient stable for discharge home.  Patient has follow-up with her primary care provider new primary care provider used to be followed by Dr. Luan Pulling.  She is got lab work to be done tomorrow and then has follow-up on the 15th.  Final Clinical Impression(s) / ED Diagnoses Final diagnoses:  Myalgia  Generalized weakness    Rx / DC Orders ED Discharge Orders    None       Fredia Sorrow, MD 08/22/19 1458

## 2019-08-23 ENCOUNTER — Emergency Department (HOSPITAL_COMMUNITY)
Admission: EM | Admit: 2019-08-23 | Discharge: 2019-08-23 | Disposition: A | Discharge status: Home / Self Care | Payer: Medicare Other | Attending: Emergency Medicine | Admitting: Emergency Medicine

## 2019-08-23 ENCOUNTER — Other Ambulatory Visit: Payer: Self-pay

## 2019-08-23 ENCOUNTER — Emergency Department (HOSPITAL_COMMUNITY): Payer: Medicare Other

## 2019-08-23 ENCOUNTER — Encounter (HOSPITAL_COMMUNITY): Payer: Self-pay | Admitting: *Deleted

## 2019-08-23 DIAGNOSIS — Z7982 Long term (current) use of aspirin: Secondary | ICD-10-CM | POA: Insufficient documentation

## 2019-08-23 DIAGNOSIS — Z79899 Other long term (current) drug therapy: Secondary | ICD-10-CM | POA: Diagnosis not present

## 2019-08-23 DIAGNOSIS — R531 Weakness: Secondary | ICD-10-CM | POA: Insufficient documentation

## 2019-08-23 DIAGNOSIS — Z87891 Personal history of nicotine dependence: Secondary | ICD-10-CM | POA: Insufficient documentation

## 2019-08-23 DIAGNOSIS — I252 Old myocardial infarction: Secondary | ICD-10-CM | POA: Insufficient documentation

## 2019-08-23 DIAGNOSIS — N3001 Acute cystitis with hematuria: Secondary | ICD-10-CM | POA: Insufficient documentation

## 2019-08-23 DIAGNOSIS — J449 Chronic obstructive pulmonary disease, unspecified: Secondary | ICD-10-CM | POA: Insufficient documentation

## 2019-08-23 DIAGNOSIS — I5042 Chronic combined systolic (congestive) and diastolic (congestive) heart failure: Secondary | ICD-10-CM | POA: Diagnosis not present

## 2019-08-23 DIAGNOSIS — R918 Other nonspecific abnormal finding of lung field: Secondary | ICD-10-CM | POA: Diagnosis not present

## 2019-08-23 DIAGNOSIS — I110 Hypertensive heart disease with heart failure: Secondary | ICD-10-CM | POA: Insufficient documentation

## 2019-08-23 LAB — URINALYSIS, ROUTINE W REFLEX MICROSCOPIC
Bilirubin Urine: NEGATIVE
Glucose, UA: NEGATIVE mg/dL
Ketones, ur: 5 mg/dL — AB
Nitrite: POSITIVE — AB
Protein, ur: NEGATIVE mg/dL
Specific Gravity, Urine: 1.003 — ABNORMAL LOW (ref 1.005–1.030)
pH: 7 (ref 5.0–8.0)

## 2019-08-23 LAB — CBC WITH DIFFERENTIAL/PLATELET
Abs Immature Granulocytes: 0.03 10*3/uL (ref 0.00–0.07)
Basophils Absolute: 0 10*3/uL (ref 0.0–0.1)
Basophils Relative: 1 %
Eosinophils Absolute: 0.3 10*3/uL (ref 0.0–0.5)
Eosinophils Relative: 3 %
HCT: 36 % (ref 36.0–46.0)
Hemoglobin: 11.1 g/dL — ABNORMAL LOW (ref 12.0–15.0)
Immature Granulocytes: 0 %
Lymphocytes Relative: 21 %
Lymphs Abs: 1.8 10*3/uL (ref 0.7–4.0)
MCH: 25.9 pg — ABNORMAL LOW (ref 26.0–34.0)
MCHC: 30.8 g/dL (ref 30.0–36.0)
MCV: 83.9 fL (ref 80.0–100.0)
Monocytes Absolute: 1 10*3/uL (ref 0.1–1.0)
Monocytes Relative: 11 %
Neutro Abs: 5.6 10*3/uL (ref 1.7–7.7)
Neutrophils Relative %: 64 %
Platelets: 315 10*3/uL (ref 150–400)
RBC: 4.29 MIL/uL (ref 3.87–5.11)
RDW: 14.6 % (ref 11.5–15.5)
WBC: 8.7 10*3/uL (ref 4.0–10.5)
nRBC: 0 % (ref 0.0–0.2)

## 2019-08-23 LAB — COMPREHENSIVE METABOLIC PANEL
ALT: 11 U/L (ref 0–44)
AST: 15 U/L (ref 15–41)
Albumin: 3.5 g/dL (ref 3.5–5.0)
Alkaline Phosphatase: 67 U/L (ref 38–126)
Anion gap: 9 (ref 5–15)
BUN: 14 mg/dL (ref 8–23)
CO2: 27 mmol/L (ref 22–32)
Calcium: 9.8 mg/dL (ref 8.9–10.3)
Chloride: 102 mmol/L (ref 98–111)
Creatinine, Ser: 0.58 mg/dL (ref 0.44–1.00)
GFR calc Af Amer: >60 mL/min (ref >60)
GFR calc non Af Amer: >60 mL/min (ref >60)
Glucose, Bld: 101 mg/dL — ABNORMAL HIGH (ref 70–99)
Potassium: 3.8 mmol/L (ref 3.5–5.1)
Sodium: 138 mmol/L (ref 135–145)
Total Bilirubin: 1 mg/dL (ref 0.3–1.2)
Total Protein: 6.3 g/dL — ABNORMAL LOW (ref 6.5–8.1)

## 2019-08-23 LAB — TROPONIN I (HIGH SENSITIVITY)
Troponin I (High Sensitivity): 8 ng/L (ref <18)
Troponin I (High Sensitivity): 8 ng/L (ref <18)

## 2019-08-23 MED ORDER — KETOROLAC TROMETHAMINE 30 MG/ML IJ SOLN
15.0000 mg | Freq: Once | INTRAMUSCULAR | Status: AC
  Administered 2019-08-23: 15 mg via INTRAVENOUS
  Filled 2019-08-23: qty 1

## 2019-08-23 MED ORDER — SODIUM CHLORIDE 0.9 % IV SOLN
1.0000 g | Freq: Once | INTRAVENOUS | Status: AC
  Administered 2019-08-23: 1 g via INTRAVENOUS
  Filled 2019-08-23: qty 10

## 2019-08-23 MED ORDER — CEPHALEXIN 500 MG PO CAPS: 500.0000 mg | ORAL_CAPSULE | Freq: Two times a day (BID) | ORAL | 0 refills | Status: DC

## 2019-08-23 NOTE — Discharge Instructions (Addendum)
As discussed, you do have a urinary tract infection which can make you feel weak.  Take your next dose of antibiotics as prescribed this evening.  Make sure you are drinking plenty of fluids.  Return here for a recheck if you develop fevers, vomiting or any worsening weakness from this infection.  We have placed an order for social work consult to explore ways for you to be safer in your home and possibly get assistance in your home with your daily activities.  Expect a phone call within the next 1 to 2 days from our in-house social worker.

## 2019-08-23 NOTE — ED Provider Notes (Signed)
[facility] Provider Note   CSN: [id number] Arrival date & time: 08/23/19  0800     History Chief Complaint  Patient presents with  . Weakness    [name] is a 84 y.o. female with a history of COPD, CHF, h/o MI with stent placement, HTN on home O2 at night, returning today for persistent weakness and pain after being evaluated here yesterday.  At yesterdays visit she had complaints of pain in her lower back and hips and had a normal labs with her lumbar spine imaging revealing significant lumbar degenerative changes.  She returns today for persistent pain, most concerned about severe pain in her left lateral hip radiating down her leg to her knee.  She reports she was nearly unable to get out of bed this morning secondary to pain.  She also endorses generalized weakness.  She denies any new injury or falls.  She also reports almost daily episodes of chest pain, fleeting, described as pressure, but improved since she was placed on a new inhaler for her lungs last month (trelegy).  She denies fevers, chills, increased sob, no n/v.  Denies falls.   Also spoke with daughter Gae Dry who states pt has had home health in the past and her biggest need is help in the morning, especially getting out of bed in the morning.  She used to have a home health aid but for unknown reasons this was discontinued. Daughter is available to help when not working, and granddaughter who stays with pt is generally there to help some, but is out of town until Architectural [employer].  Pt reports granddaughter has 2 small children so her ability to help her is limited.    The history is provided by the patient and a relative.       Past Medical History:  Diagnosis Date  . Anxiety   . Cervical disc disorder with myelopathy, unspecified cervical region   . Chronic systolic (congestive) heart failure ([place])   . COPD (chronic obstructive pulmonary disease) ([place])   . Coronary artery disease   . Disc  disease with myelopathy, cervical   . Essential hypertension   . Hemorrhoids   . Liver mass   . Lung, cysts, congenital    Left lung cyst  . Myocardial infarction ([place])   . Nephrolithiasis    Embedded  . Nonischemic cardiomyopathy ([place])    LVEF 35-40% 2015  . On home O2   . Osteoarthritis   . Thoracic ascending aortic aneurysm ([facility])    4.3 cm April 2016    Patient Active Problem List   Diagnosis Date Noted  . Acute on chronic respiratory failure with hypoxia ([place]) 04/24/2018  . Chronic stable angina ([place]) 04/24/2018  . Physical deconditioning 04/24/2018  . CAD (coronary artery disease) 02/03/2018  . Ischemic cardiomyopathy 11/25/2017  . Dizziness 04/01/2017  . COPD (chronic obstructive pulmonary disease) ([place]) 04/01/2017  . CAD S/P percutaneous coronary angioplasty 04/01/2017  . NSTEMI (non-ST elevated myocardial infarction) ([place]) 11/23/2016  . Hypertensive cardiovascular disease 10/03/2015  . Protein-calorie malnutrition, severe 07/13/2015  . Demand ischemia of myocardium ([place]) 07/13/2015  . Nonischemic cardiomyopathy ([place])   . Atypical chest pain   . Left bundle branch block   . Flu-like symptoms 07/11/2015  . Elevated troponin 07/11/2015  . Acute bronchitis 07/11/2015  . GERD (gastroesophageal reflux disease) 07/11/2015  . Bronchospasm   . Ascending aortic aneurysm ([place]) 01/27/2015  . Chronic combined systolic and diastolic CHF (congestive heart failure) ([place]) 01/27/2015  .  Chest pain at rest 01/27/2015  . Essential hypertension 01/27/2015  . Anxiety 01/27/2015  . Chronic pain 01/27/2015  . Chest pain 08/20/2013  . Hypertension 08/20/2013  . Contusion of left knee 08/07/2012  . Sprain of wrist 08/07/2012  . Liver mass 05/21/2011  . Abdominal pain 05/21/2011  . Bronchitis 05/21/2011  . De Quervain's disease (tenosynovitis) 04/02/2011  . SHOULDER PAIN 01/19/2009  . CERVICALGIA 01/19/2009  . IMPINGEMENT SYNDROME 01/19/2009    Past Surgical History:    Procedure Laterality Date  . Benign breast tumors    . CHOLECYSTECTOMY    . COLONOSCOPY    . COLONOSCOPY N/A 09/22/2014   Procedure: COLONOSCOPY;  Surgeon: Rogene Houston, MD;  Location: [facility];  Service: Endoscopy;  Laterality: N/A;  830 -- to be done in OR under fluoro  . Complete hysterectomy    . CORONARY STENT INTERVENTION N/A 11/23/2016   Procedure: Coronary Stent Intervention;  Surgeon: Martinique, Peter M, MD;  Location: [facility];  Service: Cardiovascular;  Laterality: N/A;  . LEFT HEART CATH AND CORONARY ANGIOGRAPHY N/A 11/23/2016   Procedure: Left Heart Cath and Coronary Angiography;  Surgeon: Martinique, Peter M, MD;  Location: [facility];  Service: Cardiovascular;  Laterality: N/A;  . TONSILLECTOMY AND ADENOIDECTOMY       OB History    Gravida  3   Para  3   Term  3   Preterm      AB      Living  2     SAB      TAB      Ectopic      Multiple      Live Births              Family History  Problem Relation Age of Onset  . Heart disease Mother   . Aneurysm Father   . Lung cancer Brother   . Heart disease Sister   . Diabetes Brother   . Heart disease Brother     Social History   Tobacco Use  . Smoking status: Former Smoker    Packs/day: 0.25    Years: 31.00    Pack years: 7.75    Types: Cigarettes    Start date: 05/14/1977    Quit date: 05/28/2008    Years since quitting: 11.2  . Smokeless tobacco: Never Used  Substance Use Topics  . Alcohol use: No    Alcohol/week: 0.0 standard drinks  . Drug use: No    Home Medications Prior to Admission medications   Medication Sig Start Date End Date Taking? Authorizing Provider  albuterol (PROVENTIL) (2.5 MG/3ML) 0.083% nebulizer solution Take 3 mLs (2.5 mg total) by nebulization every 6 (six) hours as needed for wheezing or shortness of breath. 07/13/15  Yes Sinda Du, MD  aspirin EC 81 MG tablet Take 81 mg by mouth every morning.    Yes [provider]  atorvastatin  (LIPITOR) 80 MG tablet Take 1 tablet (80 mg total) by mouth daily. 03/26/19 08/23/19 Yes Herminio Commons, MD  carvedilol (COREG) 6.25 MG tablet TAKE ONE TABLET BY MOUTH 2 TIMES A DAY. 03/25/19  Yes Herminio Commons, MD  clorazepate (TRANXENE) 7.5 MG tablet Take 7.5 mg by mouth daily as needed for anxiety. For nerves   Yes [provider]  ENTRESTO 24-26 MG TAKE 1 TABLET BY MOUTH TWICE DAILY. 07/23/19  Yes Herminio Commons, MD  Fluticasone-Umeclidin-Vilant (TRELEGY ELLIPTA) 200-62.5-25 MCG/INH AEPB Inhale 1 puff into  the lungs daily. 08/03/19  Yes Collene Gobble, MD  ipratropium-albuterol (DUONEB) 0.5-2.5 (3) MG/3ML SOLN Take 3 mLs by nebulization every 6 (six) hours as needed. 07/08/19  Yes [provider]  meclizine (ANTIVERT) 25 MG tablet Take 25 mg by mouth as needed for dizziness.   Yes [provider]  nitroGLYCERIN (NITROSTAT) 0.4 MG SL tablet Place 1 tablet (0.4 mg total) under the tongue every 5 (five) minutes x 3 doses as needed for chest pain. 03/26/19  Yes Herminio Commons, MD  OXYGEN Inhale 2-2.5 L into the lungs at bedtime.    Yes [provider]  PROAIR HFA 108 (90 BASE) MCG/ACT inhaler Inhale 2 puffs into the lungs every 6 (six) hours as needed for wheezing or shortness of breath.  02/08/14  Yes [provider]  traMADol (ULTRAM) 50 MG tablet Take 50 mg by mouth daily as needed for moderate pain or severe pain. Maximum dose= 8 tablets per day   Yes [provider]  cephALEXin (KEFLEX) 500 MG capsule Take 1 capsule (500 mg total) by mouth 2 (two) times daily. 08/23/19   Evalee Jefferson, PA-C    Allergies    Plavix [clopidogrel bisulfate], Montelukast sodium, Alprazolam, Codeine, Percodan [oxycodone-aspirin], and Valium  Review of Systems   Review of Systems  Constitutional: Negative for chills and fever.  HENT: Negative for congestion and sore throat.   Eyes: Negative.   Respiratory: Negative for chest tightness and  shortness of breath.   Cardiovascular: Negative for chest pain.  Gastrointestinal: Negative for abdominal pain, nausea and vomiting.  Genitourinary: Negative.  Negative for dysuria.  Musculoskeletal: Positive for arthralgias and back pain. Negative for joint swelling and neck pain.  Skin: Negative.  Negative for rash and wound.  Neurological: Positive for weakness. Negative for dizziness, light-headedness, numbness and headaches.  Psychiatric/Behavioral: Negative.     Physical Exam Updated Vital Signs BP (!) 119/51   Pulse 76   Temp 98.7 F (37.1 C) (Oral)   Resp 19   Ht 5\' 2"  (1.575 m)   Wt 73 kg   SpO2 92%   BMI 29.44 kg/m   Physical Exam Vitals and nursing note reviewed.  Constitutional:      Appearance: Normal appearance. She is well-developed.  HENT:     Head: Normocephalic and atraumatic.     Mouth/Throat:     Mouth: Mucous membranes are moist.  Eyes:     Conjunctiva/sclera: Conjunctivae normal.  Cardiovascular:     Rate and Rhythm: Normal rate and regular rhythm.     Heart sounds: Normal heart sounds.  Pulmonary:     Effort: Pulmonary effort is normal.     Breath sounds: Normal breath sounds. No wheezing.  Abdominal:     General: Bowel sounds are normal.     Palpations: Abdomen is soft.     Tenderness: There is no abdominal tenderness.  Musculoskeletal:        General: Normal range of motion.     Cervical back: Normal range of motion.  Skin:    General: Skin is warm and dry.  Neurological:     General: No focal deficit present.     Mental Status: She is alert and oriented to person, place, and time.     Cranial Nerves: No cranial nerve deficit.     Gait: Gait normal.     Comments: Pt ambulates with walker without difficulty.     ED Results / Procedures / Treatments   Labs (all labs ordered  are listed, but only abnormal results are displayed) Labs Reviewed  CBC WITH DIFFERENTIAL/PLATELET - Abnormal; Notable for the following components:      Result  Value   Hemoglobin 11.1 (*)    MCH 25.9 (*)    All other components within normal limits  COMPREHENSIVE METABOLIC PANEL - Abnormal; Notable for the following components:   Glucose, Bld 101 (*)    Total Protein 6.3 (*)    All other components within normal limits  URINALYSIS, ROUTINE W REFLEX MICROSCOPIC - Abnormal; Notable for the following components:   Color, Urine AMBER (*)    APPearance CLOUDY (*)    Specific Gravity, Urine 1.003 (*)    Hgb urine dipstick MODERATE (*)    Ketones, ur 5 (*)    Nitrite POSITIVE (*)    Leukocytes,Ua LARGE (*)    Bacteria, UA MANY (*)    All other components within normal limits  URINE CULTURE  TROPONIN I (HIGH SENSITIVITY)  TROPONIN I (HIGH SENSITIVITY)    EKG EKG Interpretation  Date/Time:  Sunday August 23 2019 09:39:38 EDT Ventricular Rate:  72 PR Interval:    QRS Duration: 172 QT Interval:  464 QTC Calculation: 508 R Axis:   -156 Text Interpretation: Right and left arm electrode reversal, interpretation assumes no reversal Sinus rhythm Ventricular premature complex Nonspecific intraventricular conduction delay Probable lateral infarct, age indeterminate Anterior infarct, old Tech states not limb lead reversal but sure looks like it. Confirmed by Fredia Sorrow [phone]) on 08/23/2019 9:43:00 AM   Radiology DG Chest 2 View  Result Date: 08/23/2019 CLINICAL DATA:  Leg weakness EXAM: CHEST - 2 VIEW COMPARISON:  06/01/2019 FINDINGS: Stable enlarged cardiac silhouette. Lungs are hyperinflated. No effusion, infiltrate pneumothorax. Degenerative osteophytosis of the spine. IMPRESSION: 1. No acute cardiopulmonary process. 2. Hyperinflated lungs. 3.  Degenerative osteophytosis of the spine. Electronically Signed   By: Suzy Bouchard M.D.   On: 08/23/2019 09:28   DG Lumbar Spine Complete  Result Date: 08/22/2019 CLINICAL DATA:  Back and leg pain, no injury, lower back pain. EXAM: LUMBAR SPINE - COMPLETE 4+ VIEW COMPARISON:  02/12/2017 FINDINGS:  Extensive degenerative changes without signs of acute fracture. T12-L1 with moderate disc space narrowing and large anterior osteophyte. Preservation of disc space at L1-L2. Marked disc space narrowing, worse than on the prior study at L3-L4. Near complete loss of the disc space with extensive sclerosis at L4-L5. Facet hypertrophy in the lower lumbar spine worse than on the prior study at L4-5 and L5-S1. IMPRESSION: 1. Worsening degenerative changes in the lumbar spine since the prior study. No signs of acute fracture or malalignment. 2. Worsening disc space narrowing at L3-L4 and L4-L5. Electronically Signed   By: Zetta Bills M.D.   On: 08/22/2019 12:02   DG Hips Bilat W or Wo Pelvis 3-4 Views  Result Date: 08/23/2019 CLINICAL DATA:  Weakness.  Back pain EXAM: DG HIP (WITH OR WITHOUT PELVIS) 3-4V BILAT COMPARISON:  Lumbar radiograph 08/22/2019 FINDINGS: There is no evidence of hip fracture or dislocation. There is no evidence of arthropathy or other focal bone abnormality. IMPRESSION: No evidence of pelvic fracture or hip fracture. Electronically Signed   By: Suzy Bouchard M.D.   On: 08/23/2019 09:32    Procedures Procedures (including critical care time)  Medications Ordered in ED Medications  cefTRIAXone (ROCEPHIN) 1 g in sodium chloride 0.9 % 100 mL IVPB (0 g Intravenous Stopped 08/23/19 1127)  ketorolac (TORADOL) 30 MG/ML injection 15 mg (15 mg Intravenous Given 08/23/19 1413)  ED Course  I have reviewed the triage vital signs and the nursing notes.  Pertinent labs & imaging results that were available during my care of the patient were reviewed by me and considered in my medical decision making (see chart for details).    MDM Rules/Calculators/A&P                      Patient with generalized weakness and difficulty with home activities secondary to chronic arthritis pain.  She was seen here yesterday, labs repeated today with no significant changes.  Added a urinalysis which was  positive for an acute UTI, although patient denies urinary symptoms.  She was given IV Rocephin and will plan Keflex for further treatment of this infection.  She was able to ambulate in the department without significant difficulty.  She does have tramadol for home use but does not like to use this medication as she is afraid of becoming addicted to narcotics, although states it is helpful when she does take it.  She was encouraged to take this medication as needed for comfort and per label instructions.  We had a conversation about being at home versus consideration of nursing home placement for rehab.  She has considered this option, however is not ready to give up her home and becomes tearful during this discussion.  She was amenable to discussing possible return of home health to assist her stay in her home.  A social work consult face-to-face was ordered and patient and the daughter were advised that they should be expecting a phone call for further assistance and evaluation.  Return precautions were discussed including fevers, worsening pain, nausea or vomiting.  She was prescribed Keflex.  Patient was seen by Dr. Rogene Houston during this ED visit. Final Clinical Impression(s) / ED Diagnoses Final diagnoses:  Acute cystitis with hematuria  Weakness    Rx / DC Orders ED Discharge Orders         Ordered    cephALEXin (KEFLEX) 500 MG capsule  2 times daily     08/23/19 1418    Home Health    Comments: Pt presents 2 days in a row with complaint of chronic pain and difficulty with getting out of bed in the am and home care activities secondary to arthritis pain.   08/23/19 1428    Face-to-face encounter (required for Medicare/Medicaid patients)    Comments: I Evalee Jefferson certify that this patient is under my care and that I, or a nurse practitioner or physician's assistant working with me, had a face-to-face encounter that meets the physician face-to-face encounter requirements with this patient on  08/23/2019. The encounter with the patient was in whole, or in part for the following medical condition(s) which is the primary reason for home health care (List medical condition): chronic pain and weakness preventing ADL's   08/23/19 1428           Evalee Jefferson, PA-C 08/23/19 1451    Fredia Sorrow, MD 08/26/19 1722

## 2019-08-23 NOTE — ED Provider Notes (Signed)
Medical screening examination/treatment/procedure(s) were conducted as a shared visit with non-physician practitioner(s) and myself.  I personally evaluated the patient during the encounter.  EKG Interpretation  Date/Time:  Sunday August 23 2019 09:39:38 EDT Ventricular Rate:  72 PR Interval:    QRS Duration: 172 QT Interval:  464 QTC Calculation: 508 R Axis:   -156 Text Interpretation: Right and left arm electrode reversal, interpretation assumes no reversal Sinus rhythm Ventricular premature complex Nonspecific intraventricular conduction delay Probable lateral infarct, age indeterminate Anterior infarct, old Tech states not limb lead reversal but sure looks like it. Confirmed by Fredia Sorrow [phone]) on 08/23/2019 9:43:00 AM    Regarding EKG.  Today's EKG probably has an lead reversal.  We will have them repeated.  Probably reason why the EKG is being read as a nonspecific inter ventricular conduction delay.  Probably wants the lung the reversals corrected this will look like a left bundle branch block pattern.  Tech states it is not limb lead reversal.  But looking at the EKG compared to January sure looks like there is limb lead reversal.  But either way we will just interpreted as it is.     Fredia Sorrow, MD 08/26/19 1725

## 2019-08-23 NOTE — ED Notes (Signed)
Pt. Ambulated to restroom with walker

## 2019-08-23 NOTE — ED Provider Notes (Signed)
Medical screening examination/treatment/procedure(s) were conducted as a shared visit with non-physician practitioner(s) and myself.  I personally evaluated the patient during the encounter.      ED ECG REPORT   Date: 08/23/2019  Rate: 78  Rhythm: normal sinus rhythm  QRS Axis: normal  Intervals: normal  ST/T Wave abnormalities: nonspecific ST/T changes  Conduction Disutrbances:nonspecific intraventricular conduction delay  Narrative Interpretation:   Old EKG Reviewed: changes noted   Old EKG showed left bundle branch block.  Here today is a nonspecific intraventricular conduction delay.  But morphology is similar to past EKGs.  No evidence of any premature ventricular contractions.  I have personally reviewed the EKG tracing and agree with the computerized printout as noted.   Patient seen by me along with physician assistant.  I saw patient yesterday.  Patient back for similar findings of complaint of weakness.  Here today more of complaint of  hip pain.  Patient had lab work-up and x-rays of lumbar back yesterday.  Had pain control and patient felt much better and was walking around the department both with walker and without walker.  Patient went home by her daughter who brought her in this morning.  Physician assistant had long conversation with the daughter.  We will reevaluate with some labs and get x-ray of the hip area.  If no indication for admission the daughter would like for her to be able to go home we will maybe have social work visit at home.  Patient improved significantly with hydromorphone for pain control yesterday.  Patient today does appear a lot more comfortable and did transfer from the wheelchair to the bed much better than she did yesterday.   Fredia Sorrow, MD 08/23/19 [phone]

## 2019-08-23 NOTE — ED Triage Notes (Signed)
Patient presents to the ED with weakness in her legs.  Patient able to ambulate self from wheelchair to bed.

## 2019-08-24 DIAGNOSIS — E785 Hyperlipidemia, unspecified: Secondary | ICD-10-CM | POA: Diagnosis not present

## 2019-08-24 DIAGNOSIS — I10 Essential (primary) hypertension: Secondary | ICD-10-CM | POA: Diagnosis not present

## 2019-08-24 DIAGNOSIS — Z79899 Other long term (current) drug therapy: Secondary | ICD-10-CM | POA: Diagnosis not present

## 2019-08-25 LAB — URINE CULTURE

## 2019-08-27 DIAGNOSIS — I10 Essential (primary) hypertension: Secondary | ICD-10-CM | POA: Diagnosis not present

## 2019-08-27 DIAGNOSIS — E785 Hyperlipidemia, unspecified: Secondary | ICD-10-CM | POA: Diagnosis not present

## 2019-08-27 DIAGNOSIS — I2510 Atherosclerotic heart disease of native coronary artery without angina pectoris: Secondary | ICD-10-CM | POA: Diagnosis not present

## 2019-08-27 DIAGNOSIS — R42 Dizziness and giddiness: Secondary | ICD-10-CM | POA: Diagnosis not present

## 2019-08-27 DIAGNOSIS — R7303 Prediabetes: Secondary | ICD-10-CM | POA: Diagnosis not present

## 2019-08-27 DIAGNOSIS — I5022 Chronic systolic (congestive) heart failure: Secondary | ICD-10-CM | POA: Diagnosis not present

## 2019-08-28 DIAGNOSIS — M19011 Primary osteoarthritis, right shoulder: Secondary | ICD-10-CM | POA: Diagnosis not present

## 2019-08-30 DIAGNOSIS — Z888 Allergy status to other drugs, medicaments and biological substances status: Secondary | ICD-10-CM | POA: Diagnosis not present

## 2019-08-30 DIAGNOSIS — Z79899 Other long term (current) drug therapy: Secondary | ICD-10-CM | POA: Diagnosis not present

## 2019-08-30 DIAGNOSIS — M542 Cervicalgia: Secondary | ICD-10-CM | POA: Diagnosis not present

## 2019-08-30 DIAGNOSIS — I10 Essential (primary) hypertension: Secondary | ICD-10-CM | POA: Diagnosis not present

## 2019-08-30 DIAGNOSIS — Z87891 Personal history of nicotine dependence: Secondary | ICD-10-CM | POA: Diagnosis not present

## 2019-08-30 DIAGNOSIS — M47816 Spondylosis without myelopathy or radiculopathy, lumbar region: Secondary | ICD-10-CM | POA: Diagnosis not present

## 2019-08-30 DIAGNOSIS — M5442 Lumbago with sciatica, left side: Secondary | ICD-10-CM | POA: Diagnosis not present

## 2019-08-30 DIAGNOSIS — M5441 Lumbago with sciatica, right side: Secondary | ICD-10-CM | POA: Diagnosis not present

## 2019-08-30 DIAGNOSIS — M5489 Other dorsalgia: Secondary | ICD-10-CM | POA: Diagnosis not present

## 2019-08-30 DIAGNOSIS — Z7982 Long term (current) use of aspirin: Secondary | ICD-10-CM | POA: Diagnosis not present

## 2019-08-30 DIAGNOSIS — R52 Pain, unspecified: Secondary | ICD-10-CM | POA: Diagnosis not present

## 2019-08-30 DIAGNOSIS — R5381 Other malaise: Secondary | ICD-10-CM | POA: Diagnosis not present

## 2019-08-30 DIAGNOSIS — Z885 Allergy status to narcotic agent status: Secondary | ICD-10-CM | POA: Diagnosis not present

## 2019-09-02 DIAGNOSIS — I10 Essential (primary) hypertension: Secondary | ICD-10-CM | POA: Diagnosis not present

## 2019-09-02 DIAGNOSIS — M4312 Spondylolisthesis, cervical region: Secondary | ICD-10-CM | POA: Diagnosis not present

## 2019-09-02 DIAGNOSIS — M5442 Lumbago with sciatica, left side: Secondary | ICD-10-CM | POA: Diagnosis not present

## 2019-09-02 DIAGNOSIS — Z87440 Personal history of urinary (tract) infections: Secondary | ICD-10-CM | POA: Diagnosis not present

## 2019-09-02 DIAGNOSIS — M5441 Lumbago with sciatica, right side: Secondary | ICD-10-CM | POA: Diagnosis not present

## 2019-09-02 DIAGNOSIS — M47816 Spondylosis without myelopathy or radiculopathy, lumbar region: Secondary | ICD-10-CM | POA: Diagnosis not present

## 2019-09-02 DIAGNOSIS — Z7982 Long term (current) use of aspirin: Secondary | ICD-10-CM | POA: Diagnosis not present

## 2019-09-02 DIAGNOSIS — Z981 Arthrodesis status: Secondary | ICD-10-CM | POA: Diagnosis not present

## 2019-09-02 DIAGNOSIS — Z9981 Dependence on supplemental oxygen: Secondary | ICD-10-CM | POA: Diagnosis not present

## 2019-09-02 DIAGNOSIS — Z87891 Personal history of nicotine dependence: Secondary | ICD-10-CM | POA: Diagnosis not present

## 2019-09-02 DIAGNOSIS — J449 Chronic obstructive pulmonary disease, unspecified: Secondary | ICD-10-CM | POA: Diagnosis not present

## 2019-09-03 DIAGNOSIS — M47816 Spondylosis without myelopathy or radiculopathy, lumbar region: Secondary | ICD-10-CM | POA: Diagnosis not present

## 2019-09-03 DIAGNOSIS — M5442 Lumbago with sciatica, left side: Secondary | ICD-10-CM | POA: Diagnosis not present

## 2019-09-03 DIAGNOSIS — M4312 Spondylolisthesis, cervical region: Secondary | ICD-10-CM | POA: Diagnosis not present

## 2019-09-03 DIAGNOSIS — M5441 Lumbago with sciatica, right side: Secondary | ICD-10-CM | POA: Diagnosis not present

## 2019-09-03 DIAGNOSIS — J449 Chronic obstructive pulmonary disease, unspecified: Secondary | ICD-10-CM | POA: Diagnosis not present

## 2019-09-03 DIAGNOSIS — I10 Essential (primary) hypertension: Secondary | ICD-10-CM | POA: Diagnosis not present

## 2019-09-04 DIAGNOSIS — J449 Chronic obstructive pulmonary disease, unspecified: Secondary | ICD-10-CM | POA: Diagnosis not present

## 2019-09-04 DIAGNOSIS — M47816 Spondylosis without myelopathy or radiculopathy, lumbar region: Secondary | ICD-10-CM | POA: Diagnosis not present

## 2019-09-04 DIAGNOSIS — M5442 Lumbago with sciatica, left side: Secondary | ICD-10-CM | POA: Diagnosis not present

## 2019-09-04 DIAGNOSIS — M4312 Spondylolisthesis, cervical region: Secondary | ICD-10-CM | POA: Diagnosis not present

## 2019-09-04 DIAGNOSIS — M5441 Lumbago with sciatica, right side: Secondary | ICD-10-CM | POA: Diagnosis not present

## 2019-09-04 DIAGNOSIS — I10 Essential (primary) hypertension: Secondary | ICD-10-CM | POA: Diagnosis not present

## 2019-09-07 DIAGNOSIS — M5442 Lumbago with sciatica, left side: Secondary | ICD-10-CM | POA: Diagnosis not present

## 2019-09-07 DIAGNOSIS — M5441 Lumbago with sciatica, right side: Secondary | ICD-10-CM | POA: Diagnosis not present

## 2019-09-07 DIAGNOSIS — M4312 Spondylolisthesis, cervical region: Secondary | ICD-10-CM | POA: Diagnosis not present

## 2019-09-07 DIAGNOSIS — M47816 Spondylosis without myelopathy or radiculopathy, lumbar region: Secondary | ICD-10-CM | POA: Diagnosis not present

## 2019-09-07 DIAGNOSIS — J449 Chronic obstructive pulmonary disease, unspecified: Secondary | ICD-10-CM | POA: Diagnosis not present

## 2019-09-07 DIAGNOSIS — I10 Essential (primary) hypertension: Secondary | ICD-10-CM | POA: Diagnosis not present

## 2019-09-09 DIAGNOSIS — M47816 Spondylosis without myelopathy or radiculopathy, lumbar region: Secondary | ICD-10-CM | POA: Diagnosis not present

## 2019-09-09 DIAGNOSIS — M5442 Lumbago with sciatica, left side: Secondary | ICD-10-CM | POA: Diagnosis not present

## 2019-09-09 DIAGNOSIS — M5441 Lumbago with sciatica, right side: Secondary | ICD-10-CM | POA: Diagnosis not present

## 2019-09-09 DIAGNOSIS — M4312 Spondylolisthesis, cervical region: Secondary | ICD-10-CM | POA: Diagnosis not present

## 2019-09-09 DIAGNOSIS — I10 Essential (primary) hypertension: Secondary | ICD-10-CM | POA: Diagnosis not present

## 2019-09-09 DIAGNOSIS — J449 Chronic obstructive pulmonary disease, unspecified: Secondary | ICD-10-CM | POA: Diagnosis not present

## 2019-09-10 DIAGNOSIS — M1990 Unspecified osteoarthritis, unspecified site: Secondary | ICD-10-CM | POA: Diagnosis not present

## 2019-09-10 DIAGNOSIS — M2550 Pain in unspecified joint: Secondary | ICD-10-CM | POA: Diagnosis not present

## 2019-09-14 ENCOUNTER — Other Ambulatory Visit: Payer: Self-pay | Admitting: Emergency Medicine

## 2019-09-14 DIAGNOSIS — I10 Essential (primary) hypertension: Secondary | ICD-10-CM | POA: Diagnosis not present

## 2019-09-14 DIAGNOSIS — M4312 Spondylolisthesis, cervical region: Secondary | ICD-10-CM | POA: Diagnosis not present

## 2019-09-14 DIAGNOSIS — J449 Chronic obstructive pulmonary disease, unspecified: Secondary | ICD-10-CM | POA: Diagnosis not present

## 2019-09-14 DIAGNOSIS — M5441 Lumbago with sciatica, right side: Secondary | ICD-10-CM | POA: Diagnosis not present

## 2019-09-14 DIAGNOSIS — M5442 Lumbago with sciatica, left side: Secondary | ICD-10-CM | POA: Diagnosis not present

## 2019-09-14 DIAGNOSIS — M47816 Spondylosis without myelopathy or radiculopathy, lumbar region: Secondary | ICD-10-CM | POA: Diagnosis not present

## 2019-09-17 DIAGNOSIS — J449 Chronic obstructive pulmonary disease, unspecified: Secondary | ICD-10-CM | POA: Diagnosis not present

## 2019-09-17 DIAGNOSIS — M47816 Spondylosis without myelopathy or radiculopathy, lumbar region: Secondary | ICD-10-CM | POA: Diagnosis not present

## 2019-09-17 DIAGNOSIS — I10 Essential (primary) hypertension: Secondary | ICD-10-CM | POA: Diagnosis not present

## 2019-09-17 DIAGNOSIS — M5442 Lumbago with sciatica, left side: Secondary | ICD-10-CM | POA: Diagnosis not present

## 2019-09-17 DIAGNOSIS — M4312 Spondylolisthesis, cervical region: Secondary | ICD-10-CM | POA: Diagnosis not present

## 2019-09-17 DIAGNOSIS — M5441 Lumbago with sciatica, right side: Secondary | ICD-10-CM | POA: Diagnosis not present

## 2019-09-22 ENCOUNTER — Telehealth (INDEPENDENT_AMBULATORY_CARE_PROVIDER_SITE_OTHER): Payer: Medicare Other | Admitting: Cardiovascular Disease

## 2019-09-22 ENCOUNTER — Encounter: Payer: Self-pay | Admitting: Cardiovascular Disease

## 2019-09-22 VITALS — BP 156/76 | HR 88 | Ht 62.0 in | Wt 162.0 lb

## 2019-09-22 DIAGNOSIS — E785 Hyperlipidemia, unspecified: Secondary | ICD-10-CM

## 2019-09-22 DIAGNOSIS — I712 Thoracic aortic aneurysm, without rupture: Secondary | ICD-10-CM | POA: Diagnosis not present

## 2019-09-22 DIAGNOSIS — M5441 Lumbago with sciatica, right side: Secondary | ICD-10-CM | POA: Diagnosis not present

## 2019-09-22 DIAGNOSIS — I5042 Chronic combined systolic (congestive) and diastolic (congestive) heart failure: Secondary | ICD-10-CM

## 2019-09-22 DIAGNOSIS — M5442 Lumbago with sciatica, left side: Secondary | ICD-10-CM | POA: Diagnosis not present

## 2019-09-22 DIAGNOSIS — I25118 Atherosclerotic heart disease of native coronary artery with other forms of angina pectoris: Secondary | ICD-10-CM

## 2019-09-22 DIAGNOSIS — M4312 Spondylolisthesis, cervical region: Secondary | ICD-10-CM | POA: Diagnosis not present

## 2019-09-22 DIAGNOSIS — I10 Essential (primary) hypertension: Secondary | ICD-10-CM | POA: Diagnosis not present

## 2019-09-22 DIAGNOSIS — J449 Chronic obstructive pulmonary disease, unspecified: Secondary | ICD-10-CM | POA: Diagnosis not present

## 2019-09-22 DIAGNOSIS — I7121 Aneurysm of the ascending aorta, without rupture: Secondary | ICD-10-CM

## 2019-09-22 DIAGNOSIS — Z955 Presence of coronary angioplasty implant and graft: Secondary | ICD-10-CM | POA: Diagnosis not present

## 2019-09-22 DIAGNOSIS — M47816 Spondylosis without myelopathy or radiculopathy, lumbar region: Secondary | ICD-10-CM | POA: Diagnosis not present

## 2019-09-22 MED ORDER — CARVEDILOL 12.5 MG PO TABS: 12.5000 mg | ORAL_TABLET | Freq: Two times a day (BID) | ORAL | 3 refills | Status: DC

## 2019-09-22 NOTE — Addendum Note (Signed)
Addended by: Barbarann Ehlers A on: 09/22/2019 11:09 AM   Modules accepted: Orders

## 2019-09-22 NOTE — Progress Notes (Signed)
Virtual Visit via Telephone Note   This visit type was conducted due to national recommendations for restrictions regarding the COVID-19 Pandemic (e.g. social distancing) in an effort to limit this patient's exposure and mitigate transmission in our community.  Due to her co-morbid illnesses, this patient is at least at moderate risk for complications without adequate follow up.  This format is felt to be most appropriate for this patient at this time.  The patient did not have access to video technology/had technical difficulties with video requiring transitioning to audio format only (telephone).  All issues noted in this document were discussed and addressed.  No physical exam could be performed with this format.  Please refer to the patient's chart for her  consent to telehealth for [facility].   Date:  09/22/2019   ID:  [name], DOB 05/22/1935, MRN [id number]  Patient Location: Home Provider Location: Office  PCP:  Denyce Robert, FNP  Cardiologist:  Kate Sable, MD  Electrophysiologist:  None   Evaluation Performed:  Follow-Up Visit  Chief Complaint:  CAD  History of Present Illness:    [name] is a 84 y.o. female with  a h/o coronary artery disease and COPD with frequent exacerbations requiring hospitalizations.  She underwent drug-eluting stent placement to the proximal and mid LAD as well as the first marginal branch on 11/23/16.  She was hospitalized for a COPD exacerbation in August 2020.  She has chronic exertional dyspnea which is stable.  Her granddaughter lives with her.  She said she is doing very well. She has chronic chest pains. No recent nitro use.   She has had ED evaluations for weakness and hip pain.  Troponins and renal function were normal on 08/23/2019. Hemoglobin mildly low at 11.1.  Chest xray showed no acute cardiopulmonary process and hyperinflated lungs.  She complains of diffuse arthritic pain.   She is worried about her  sister who is in failing health.   Past Medical History:  Diagnosis Date  . Anxiety   . Cervical disc disorder with myelopathy, unspecified cervical region   . Chronic systolic (congestive) heart failure ([place])   . COPD (chronic obstructive pulmonary disease) ([place])   . Coronary artery disease   . Disc disease with myelopathy, cervical   . Essential hypertension   . Hemorrhoids   . Liver mass   . Lung, cysts, congenital    Left lung cyst  . Myocardial infarction ([place])   . Nephrolithiasis    Embedded  . Nonischemic cardiomyopathy ([place])    LVEF 35-40% 2015  . On home O2   . Osteoarthritis   . Thoracic ascending aortic aneurysm (HCC)    4.3 cm April 2016   Past Surgical History:  Procedure Laterality Date  . Benign breast tumors    . CHOLECYSTECTOMY    . COLONOSCOPY    . COLONOSCOPY N/A 09/22/2014   Procedure: COLONOSCOPY;  Surgeon: Rogene Houston, MD;  [facility];  Service: Endoscopy;  Laterality: N/A;  830 -- to be done in OR under fluoro  . Complete hysterectomy    . CORONARY STENT INTERVENTION N/A 11/23/2016   Procedure: Coronary Stent Intervention;  Surgeon: Martinique, Peter M, MD;  Location: [place] CV LAB;  Service: Cardiovascular;  Laterality: N/A;  . LEFT HEART CATH AND CORONARY ANGIOGRAPHY N/A 11/23/2016   Procedure: Left Heart Cath and Coronary Angiography;  Surgeon: Martinique, Peter M, MD;  Location: [place] CV LAB;  Service: Cardiovascular;  Laterality: N/A;  .  TONSILLECTOMY AND ADENOIDECTOMY       Current Meds  Medication Sig  . albuterol (PROVENTIL) (2.5 MG/3ML) 0.083% nebulizer solution Take 3 mLs (2.5 mg total) by nebulization every 6 (six) hours as needed for wheezing or shortness of breath.  Marland Kitchen aspirin EC 81 MG tablet Take 81 mg by mouth every morning.   Marland Kitchen atorvastatin (LIPITOR) 80 MG tablet Take 1 tablet (80 mg total) by mouth daily.  . carvedilol (COREG) 6.25 MG tablet TAKE ONE TABLET BY MOUTH 2 TIMES A DAY.  . clorazepate (TRANXENE) 7.5 MG  tablet Take 7.5 mg by mouth daily as needed for anxiety. For nerves  . ENTRESTO 24-26 MG TAKE 1 TABLET BY MOUTH TWICE DAILY.  Marland Kitchen ipratropium-albuterol (DUONEB) 0.5-2.5 (3) MG/3ML SOLN Take 3 mLs by nebulization every 6 (six) hours as needed.  . meclizine (ANTIVERT) 25 MG tablet Take 25 mg by mouth as needed for dizziness.  . meloxicam (MOBIC) 7.5 MG tablet Take 7.5 mg by mouth daily.  . nitroGLYCERIN (NITROSTAT) 0.4 MG SL tablet Place 1 tablet (0.4 mg total) under the tongue every 5 (five) minutes x 3 doses as needed for chest pain.  . OXYGEN Inhale 2-2.5 L into the lungs at bedtime.   Marland Kitchen PROAIR HFA 108 (90 BASE) MCG/ACT inhaler Inhale 2 puffs into the lungs every 6 (six) hours as needed for wheezing or shortness of breath.   . traMADol (ULTRAM) 50 MG tablet Take 50 mg by mouth daily as needed for moderate pain or severe pain. Maximum dose= 8 tablets per day  . TRELEGY ELLIPTA 200-62.5-25 MCG/INH AEPB INHALE (1) PUFF BY MOUTH INTO THE LUNGS DAILY.     Allergies:   Plavix [clopidogrel bisulfate], Montelukast sodium, Alprazolam, Codeine, Percodan [oxycodone-aspirin], and Valium   Social History   Tobacco Use  . Smoking status: Former Smoker    Packs/day: 0.25    Years: 31.00    Pack years: 7.75    Types: Cigarettes    Start date: 05/14/1977    Quit date: 05/28/2008    Years since quitting: 11.3  . Smokeless tobacco: Never Used  Substance Use Topics  . Alcohol use: No    Alcohol/week: 0.0 standard drinks  . Drug use: No     Family Hx: The patient's family history includes Aneurysm in her father; Diabetes in her brother; Heart disease in her brother, mother, and sister; Lung cancer in her brother.  ROS:   Please see the history of present illness.     All other systems reviewed and are negative.   Prior CV studies:   The following studies were reviewed today:  Cath 11/23/2016:   LV end diastolic pressure is normal.  Mid Cx lesion, 30 %stenosed.  Prox RCA lesion, 20  %stenosed.  Prox LAD to Mid LAD lesion, 85 %stenosed.  A STENT PROMUS PREM MR 3.5X32 drug eluting stent was successfully placed.  Post intervention, there is a 0% residual stenosis.  1st Mrg lesion, 90 %stenosed.  A STENT PROMUS PREM MR 2.5X16 drug eluting stent was successfully placed.  Post intervention, there is a 0% residual stenosis.  1. 2 vessel obstructive CAD - 85% segmental mid LAD - 90% mid OM1.  2. Normal LVEDP 3. Successful stenting of the mid LAD with DES 4. Successful stenting of the mid OM1 with DES   Echocardiogram 12/17/18:    1. The left ventricle has a visually estimated ejection fraction of 45%. The cavity size was normal. There is moderate concentric left ventricular hypertrophy. Left  ventricular diastolic Doppler parameters are consistent with impaired relaxation.  Elevated left ventricular end-diastolic pressure Left ventricular diffuse hypokinesis.  2. The right ventricle has normal systolic function. The cavity was normal. There is no increase in right ventricular wall thickness.  3. The mitral valve is grossly normal. Mild thickening of the mitral valve leaflet.  4. The tricuspid valve is grossly normal.  5. The aortic valve was not well visualized. Aortic valve regurgitation is trivial by color flow Doppler.  6. The aorta is normal in size and structure.  7. The inferior vena cava was dilated in size with >50% respiratory variability.  Labs/Other Tests and Data Reviewed:    EKG:  ECG on 08/23/19 showed sinus rhythm with LBBB (old) and PVC  Recent Labs: 06/01/2019: B Natriuretic Peptide 122.0 08/23/2019: ALT 11; BUN 14; Creatinine, Ser 0.58; Hemoglobin 11.1; Platelets 315; Potassium 3.8; Sodium 138   Recent Lipid Panel Lab Results  Component Value Date/Time   CHOL 171 12/19/2018 05:28 AM   TRIG 53 12/19/2018 05:28 AM   HDL 79 12/19/2018 05:28 AM   CHOLHDL 2.2 12/19/2018 05:28 AM   LDLCALC 81 12/19/2018 05:28 AM    Wt Readings from Last  3 Encounters:  09/22/19 162 lb (73.5 kg)  08/23/19 160 lb 15 oz (73 kg)  08/22/19 162 lb (73.5 kg)     Objective:    Vital Signs:  BP (!) 156/76   Pulse 88   Ht 5\' 2"  (1.575 m)   Wt 162 lb (73.5 kg)   BMI 29.63 kg/m      ASSESSMENT & PLAN:    1. Coronary artery disease: She underwent stenting of the LAD and OM1 in July 2018. Symptomatically stable.Continue aspirin, atorvastatin, and carvedilol (I will increase to 12.5 mg twice daily for BP control). Of note, Brilinta presumably led to increased shortness of breath from baselinein the past. She was switched to Plavix but this led to severe itching.Shetolerated Effient without difficulty.   2. Hypertension: Blood pressure is elevated.  I will increase carvedilol to 12.5 mg twice daily.  3. Thoracic aortic aneurysm: Stable at 4.2 cm by CT angiography of the chest on 11/11/2017.  4. Chronic combined heart failure: Euvolemic and stable.  I will increase carvedilol to 12.5 mg twice daily for blood pressure control.  Continue Entresto.No diuretic requirement at this time.  5. Hyperlipidemia: LDL 81 on 12/19/18.   Continue atorvastatin.     COVID-19 Education: The signs and symptoms of COVID-19 were discussed with the patient and how to seek care for testing (follow up with PCP or arrange E-visit).  The importance of social distancing was discussed today.  Time:   Today, I have spent 25 minutes with the patient with telehealth technology discussing the above problems.     Medication Adjustments/Labs and Tests Ordered: Current medicines are reviewed at length with the patient today.  Concerns regarding medicines are outlined above.   Tests Ordered: No orders of the defined types were placed in this encounter.   Medication Changes: No orders of the defined types were placed in this encounter.   Follow Up:  Office visit in 6 months  Signed, Kate Sable, MD  09/22/2019 10:52 AM    [place]

## 2019-09-22 NOTE — Patient Instructions (Signed)
Medication Instructions: INCREASE Coreg 12.5 mg twice a day  Labwork: None today  Procedures/Testing: None today  Follow-Up: 6 months office visit with Physician Assistant   Any Additional Special Instructions Will Be Listed Below (If Applicable).     If you need a refill on your cardiac medications before your next appointment, please call your pharmacy.       Thank you for choosing [facility] !

## 2019-09-23 ENCOUNTER — Other Ambulatory Visit: Payer: Self-pay | Admitting: Cardiovascular Disease

## 2019-09-24 DIAGNOSIS — J449 Chronic obstructive pulmonary disease, unspecified: Secondary | ICD-10-CM | POA: Diagnosis not present

## 2019-09-24 DIAGNOSIS — M4312 Spondylolisthesis, cervical region: Secondary | ICD-10-CM | POA: Diagnosis not present

## 2019-09-24 DIAGNOSIS — M5441 Lumbago with sciatica, right side: Secondary | ICD-10-CM | POA: Diagnosis not present

## 2019-09-24 DIAGNOSIS — M5442 Lumbago with sciatica, left side: Secondary | ICD-10-CM | POA: Diagnosis not present

## 2019-09-24 DIAGNOSIS — I10 Essential (primary) hypertension: Secondary | ICD-10-CM | POA: Diagnosis not present

## 2019-09-24 DIAGNOSIS — M47816 Spondylosis without myelopathy or radiculopathy, lumbar region: Secondary | ICD-10-CM | POA: Diagnosis not present

## 2019-09-29 DIAGNOSIS — M47816 Spondylosis without myelopathy or radiculopathy, lumbar region: Secondary | ICD-10-CM | POA: Diagnosis not present

## 2019-09-29 DIAGNOSIS — M5442 Lumbago with sciatica, left side: Secondary | ICD-10-CM | POA: Diagnosis not present

## 2019-09-29 DIAGNOSIS — M4312 Spondylolisthesis, cervical region: Secondary | ICD-10-CM | POA: Diagnosis not present

## 2019-09-29 DIAGNOSIS — M5441 Lumbago with sciatica, right side: Secondary | ICD-10-CM | POA: Diagnosis not present

## 2019-09-29 DIAGNOSIS — I10 Essential (primary) hypertension: Secondary | ICD-10-CM | POA: Diagnosis not present

## 2019-09-29 DIAGNOSIS — J449 Chronic obstructive pulmonary disease, unspecified: Secondary | ICD-10-CM | POA: Diagnosis not present

## 2019-10-02 DIAGNOSIS — Z87891 Personal history of nicotine dependence: Secondary | ICD-10-CM | POA: Diagnosis not present

## 2019-10-02 DIAGNOSIS — Z981 Arthrodesis status: Secondary | ICD-10-CM | POA: Diagnosis not present

## 2019-10-02 DIAGNOSIS — M47816 Spondylosis without myelopathy or radiculopathy, lumbar region: Secondary | ICD-10-CM | POA: Diagnosis not present

## 2019-10-02 DIAGNOSIS — Z7982 Long term (current) use of aspirin: Secondary | ICD-10-CM | POA: Diagnosis not present

## 2019-10-02 DIAGNOSIS — M5442 Lumbago with sciatica, left side: Secondary | ICD-10-CM | POA: Diagnosis not present

## 2019-10-02 DIAGNOSIS — M4312 Spondylolisthesis, cervical region: Secondary | ICD-10-CM | POA: Diagnosis not present

## 2019-10-02 DIAGNOSIS — J449 Chronic obstructive pulmonary disease, unspecified: Secondary | ICD-10-CM | POA: Diagnosis not present

## 2019-10-02 DIAGNOSIS — M5441 Lumbago with sciatica, right side: Secondary | ICD-10-CM | POA: Diagnosis not present

## 2019-10-02 DIAGNOSIS — I10 Essential (primary) hypertension: Secondary | ICD-10-CM | POA: Diagnosis not present

## 2019-10-02 DIAGNOSIS — Z9981 Dependence on supplemental oxygen: Secondary | ICD-10-CM | POA: Diagnosis not present

## 2019-10-02 DIAGNOSIS — Z87440 Personal history of urinary (tract) infections: Secondary | ICD-10-CM | POA: Diagnosis not present

## 2019-10-06 DIAGNOSIS — M5442 Lumbago with sciatica, left side: Secondary | ICD-10-CM | POA: Diagnosis not present

## 2019-10-06 DIAGNOSIS — M5441 Lumbago with sciatica, right side: Secondary | ICD-10-CM | POA: Diagnosis not present

## 2019-10-06 DIAGNOSIS — I10 Essential (primary) hypertension: Secondary | ICD-10-CM | POA: Diagnosis not present

## 2019-10-06 DIAGNOSIS — M47816 Spondylosis without myelopathy or radiculopathy, lumbar region: Secondary | ICD-10-CM | POA: Diagnosis not present

## 2019-10-06 DIAGNOSIS — J449 Chronic obstructive pulmonary disease, unspecified: Secondary | ICD-10-CM | POA: Diagnosis not present

## 2019-10-06 DIAGNOSIS — M4312 Spondylolisthesis, cervical region: Secondary | ICD-10-CM | POA: Diagnosis not present

## 2019-10-07 DIAGNOSIS — M2550 Pain in unspecified joint: Secondary | ICD-10-CM | POA: Diagnosis not present

## 2019-10-07 DIAGNOSIS — I5022 Chronic systolic (congestive) heart failure: Secondary | ICD-10-CM | POA: Diagnosis not present

## 2019-10-07 DIAGNOSIS — I10 Essential (primary) hypertension: Secondary | ICD-10-CM | POA: Diagnosis not present

## 2019-10-07 DIAGNOSIS — M069 Rheumatoid arthritis, unspecified: Secondary | ICD-10-CM | POA: Diagnosis not present

## 2019-10-07 DIAGNOSIS — Z9981 Dependence on supplemental oxygen: Secondary | ICD-10-CM | POA: Diagnosis not present

## 2019-10-07 DIAGNOSIS — J449 Chronic obstructive pulmonary disease, unspecified: Secondary | ICD-10-CM | POA: Diagnosis not present

## 2019-10-13 DIAGNOSIS — M5442 Lumbago with sciatica, left side: Secondary | ICD-10-CM | POA: Diagnosis not present

## 2019-10-13 DIAGNOSIS — M4312 Spondylolisthesis, cervical region: Secondary | ICD-10-CM | POA: Diagnosis not present

## 2019-10-13 DIAGNOSIS — M5441 Lumbago with sciatica, right side: Secondary | ICD-10-CM | POA: Diagnosis not present

## 2019-10-13 DIAGNOSIS — M47816 Spondylosis without myelopathy or radiculopathy, lumbar region: Secondary | ICD-10-CM | POA: Diagnosis not present

## 2019-10-13 DIAGNOSIS — I10 Essential (primary) hypertension: Secondary | ICD-10-CM | POA: Diagnosis not present

## 2019-10-13 DIAGNOSIS — J449 Chronic obstructive pulmonary disease, unspecified: Secondary | ICD-10-CM | POA: Diagnosis not present

## 2019-10-15 DIAGNOSIS — M069 Rheumatoid arthritis, unspecified: Secondary | ICD-10-CM | POA: Diagnosis not present

## 2019-10-15 DIAGNOSIS — R609 Edema, unspecified: Secondary | ICD-10-CM | POA: Diagnosis not present

## 2019-10-15 DIAGNOSIS — M2550 Pain in unspecified joint: Secondary | ICD-10-CM | POA: Diagnosis not present

## 2019-10-15 DIAGNOSIS — M259 Joint disorder, unspecified: Secondary | ICD-10-CM | POA: Diagnosis not present

## 2019-10-20 ENCOUNTER — Telehealth: Payer: Self-pay | Admitting: Cardiovascular Disease

## 2019-10-20 NOTE — Telephone Encounter (Signed)
Pt calling to make sure she should be on both Entresto and Coreg. Last office note reviewed and noted that pt was to be on both. Pt notified

## 2019-10-20 NOTE — Telephone Encounter (Signed)
Please give pt a call concerning medications   [phone]

## 2019-11-04 DIAGNOSIS — M25572 Pain in left ankle and joints of left foot: Secondary | ICD-10-CM | POA: Diagnosis not present

## 2019-11-04 DIAGNOSIS — M25542 Pain in joints of left hand: Secondary | ICD-10-CM | POA: Diagnosis not present

## 2019-11-04 DIAGNOSIS — M25541 Pain in joints of right hand: Secondary | ICD-10-CM | POA: Diagnosis not present

## 2019-11-04 DIAGNOSIS — M25571 Pain in right ankle and joints of right foot: Secondary | ICD-10-CM | POA: Diagnosis not present

## 2019-11-04 DIAGNOSIS — M069 Rheumatoid arthritis, unspecified: Secondary | ICD-10-CM | POA: Diagnosis not present

## 2019-11-07 ENCOUNTER — Encounter (HOSPITAL_COMMUNITY): Payer: Self-pay | Admitting: *Deleted

## 2019-11-07 ENCOUNTER — Emergency Department (HOSPITAL_COMMUNITY)
Admission: EM | Admit: 2019-11-07 | Discharge: 2019-11-09 | Disposition: A | Discharge status: Home / Self Care | Payer: Medicare Other | Attending: Emergency Medicine | Admitting: Emergency Medicine

## 2019-11-07 ENCOUNTER — Emergency Department (HOSPITAL_COMMUNITY): Payer: Medicare Other

## 2019-11-07 ENCOUNTER — Other Ambulatory Visit: Payer: Self-pay

## 2019-11-07 DIAGNOSIS — R2689 Other abnormalities of gait and mobility: Secondary | ICD-10-CM | POA: Diagnosis not present

## 2019-11-07 DIAGNOSIS — R2243 Localized swelling, mass and lump, lower limb, bilateral: Secondary | ICD-10-CM | POA: Diagnosis present

## 2019-11-07 DIAGNOSIS — I110 Hypertensive heart disease with heart failure: Secondary | ICD-10-CM | POA: Insufficient documentation

## 2019-11-07 DIAGNOSIS — I10 Essential (primary) hypertension: Secondary | ICD-10-CM | POA: Diagnosis not present

## 2019-11-07 DIAGNOSIS — Z7951 Long term (current) use of inhaled steroids: Secondary | ICD-10-CM | POA: Insufficient documentation

## 2019-11-07 DIAGNOSIS — I2510 Atherosclerotic heart disease of native coronary artery without angina pectoris: Secondary | ICD-10-CM | POA: Diagnosis not present

## 2019-11-07 DIAGNOSIS — M1990 Unspecified osteoarthritis, unspecified site: Secondary | ICD-10-CM

## 2019-11-07 DIAGNOSIS — Z7982 Long term (current) use of aspirin: Secondary | ICD-10-CM | POA: Diagnosis not present

## 2019-11-07 DIAGNOSIS — Z7952 Long term (current) use of systemic steroids: Secondary | ICD-10-CM | POA: Insufficient documentation

## 2019-11-07 DIAGNOSIS — I5042 Chronic combined systolic (congestive) and diastolic (congestive) heart failure: Secondary | ICD-10-CM | POA: Diagnosis not present

## 2019-11-07 DIAGNOSIS — M1909 Primary osteoarthritis, other specified site: Secondary | ICD-10-CM | POA: Insufficient documentation

## 2019-11-07 DIAGNOSIS — J449 Chronic obstructive pulmonary disease, unspecified: Secondary | ICD-10-CM | POA: Insufficient documentation

## 2019-11-07 DIAGNOSIS — Z87891 Personal history of nicotine dependence: Secondary | ICD-10-CM | POA: Diagnosis not present

## 2019-11-07 DIAGNOSIS — Z79899 Other long term (current) drug therapy: Secondary | ICD-10-CM | POA: Insufficient documentation

## 2019-11-07 DIAGNOSIS — M1380 Other specified arthritis, unspecified site: Secondary | ICD-10-CM | POA: Diagnosis not present

## 2019-11-07 DIAGNOSIS — R262 Difficulty in walking, not elsewhere classified: Secondary | ICD-10-CM

## 2019-11-07 DIAGNOSIS — R609 Edema, unspecified: Secondary | ICD-10-CM | POA: Diagnosis not present

## 2019-11-07 LAB — URINALYSIS, ROUTINE W REFLEX MICROSCOPIC
Bilirubin Urine: NEGATIVE
Glucose, UA: NEGATIVE mg/dL
Hgb urine dipstick: NEGATIVE
Ketones, ur: NEGATIVE mg/dL
Leukocytes,Ua: NEGATIVE
Nitrite: NEGATIVE
Protein, ur: NEGATIVE mg/dL
Specific Gravity, Urine: 1.004 — ABNORMAL LOW (ref 1.005–1.030)
pH: 7 (ref 5.0–8.0)

## 2019-11-07 LAB — CBC WITH DIFFERENTIAL/PLATELET
Abs Immature Granulocytes: 0.03 10*3/uL (ref 0.00–0.07)
Basophils Absolute: 0 10*3/uL (ref 0.0–0.1)
Basophils Relative: 0 %
Eosinophils Absolute: 0.2 10*3/uL (ref 0.0–0.5)
Eosinophils Relative: 3 %
HCT: 34.5 % — ABNORMAL LOW (ref 36.0–46.0)
Hemoglobin: 10.8 g/dL — ABNORMAL LOW (ref 12.0–15.0)
Immature Granulocytes: 0 %
Lymphocytes Relative: 13 %
Lymphs Abs: 1.2 10*3/uL (ref 0.7–4.0)
MCH: 25.7 pg — ABNORMAL LOW (ref 26.0–34.0)
MCHC: 31.3 g/dL (ref 30.0–36.0)
MCV: 82.1 fL (ref 80.0–100.0)
Monocytes Absolute: 0.9 10*3/uL (ref 0.1–1.0)
Monocytes Relative: 11 %
Neutro Abs: 6.4 10*3/uL (ref 1.7–7.7)
Neutrophils Relative %: 73 %
Platelets: 376 10*3/uL (ref 150–400)
RBC: 4.2 MIL/uL (ref 3.87–5.11)
RDW: 14.7 % (ref 11.5–15.5)
WBC: 8.8 10*3/uL (ref 4.0–10.5)
nRBC: 0 % (ref 0.0–0.2)

## 2019-11-07 LAB — COMPREHENSIVE METABOLIC PANEL
ALT: 9 U/L (ref 0–44)
AST: 15 U/L (ref 15–41)
Albumin: 3.4 g/dL — ABNORMAL LOW (ref 3.5–5.0)
Alkaline Phosphatase: 61 U/L (ref 38–126)
Anion gap: 10 (ref 5–15)
BUN: 11 mg/dL (ref 8–23)
CO2: 26 mmol/L (ref 22–32)
Calcium: 9.8 mg/dL (ref 8.9–10.3)
Chloride: 103 mmol/L (ref 98–111)
Creatinine, Ser: 0.53 mg/dL (ref 0.44–1.00)
GFR calc Af Amer: >60 mL/min (ref >60)
GFR calc non Af Amer: >60 mL/min (ref >60)
Glucose, Bld: 151 mg/dL — ABNORMAL HIGH (ref 70–99)
Potassium: 3.8 mmol/L (ref 3.5–5.1)
Sodium: 139 mmol/L (ref 135–145)
Total Bilirubin: 0.6 mg/dL (ref 0.3–1.2)
Total Protein: 6.5 g/dL (ref 6.5–8.1)

## 2019-11-07 LAB — BRAIN NATRIURETIC PEPTIDE: B Natriuretic Peptide: 311 pg/mL — ABNORMAL HIGH (ref 0.0–100.0)

## 2019-11-07 LAB — TROPONIN I (HIGH SENSITIVITY)
Troponin I (High Sensitivity): 8 ng/L (ref <18)
Troponin I (High Sensitivity): 10 ng/L (ref <18)

## 2019-11-07 MED ORDER — CLORAZEPATE DIPOTASSIUM 7.5 MG PO TABS: 7.5000 mg | ORAL_TABLET | Freq: Every evening | ORAL | Status: DC | PRN

## 2019-11-07 MED ORDER — DEXAMETHASONE SODIUM PHOSPHATE 10 MG/ML IJ SOLN
10.0000 mg | Freq: Once | INTRAMUSCULAR | Status: AC
  Administered 2019-11-07: 10 mg via INTRAVENOUS
  Filled 2019-11-07: qty 1

## 2019-11-07 MED ORDER — MECLIZINE HCL 12.5 MG PO TABS: 25.0000 mg | ORAL_TABLET | ORAL | Status: DC | PRN

## 2019-11-07 MED ORDER — NITROGLYCERIN 0.4 MG SL SUBL: 0.4000 mg | SUBLINGUAL_TABLET | SUBLINGUAL | Status: DC | PRN

## 2019-11-07 MED ORDER — PREDNISONE 10 MG (21) PO TBPK
ORAL_TABLET | ORAL | 0 refills | Status: DC
Start: 2019-11-07 — End: 2020-02-23

## 2019-11-07 MED ORDER — SACUBITRIL-VALSARTAN 24-26 MG PO TABS
1.0000 | ORAL_TABLET | Freq: Two times a day (BID) | ORAL | Status: DC
  Administered 2019-11-07 – 2019-11-08 (×3): 1 via ORAL
  Filled 2019-11-07 (×8): qty 1

## 2019-11-07 MED ORDER — FLUTICASONE-UMECLIDIN-VILANT 200-62.5-25 MCG/INH IN AEPB: 1.0000 | INHALATION_SPRAY | Freq: Three times a day (TID) | RESPIRATORY_TRACT | Status: DC | PRN

## 2019-11-07 MED ORDER — TRAMADOL HCL 50 MG PO TABS: 50.0000 mg | ORAL_TABLET | Freq: Every day | ORAL | Status: DC | PRN

## 2019-11-07 MED ORDER — ASPIRIN EC 81 MG PO TBEC
81.0000 mg | DELAYED_RELEASE_TABLET | ORAL | Status: DC
  Administered 2019-11-08 – 2019-11-09 (×2): 81 mg via ORAL
  Filled 2019-11-07 (×2): qty 1

## 2019-11-07 MED ORDER — PREDNISONE 50 MG PO TABS
60.0000 mg | ORAL_TABLET | Freq: Every day | ORAL | Status: DC
  Administered 2019-11-08 – 2019-11-09 (×2): 60 mg via ORAL
  Filled 2019-11-07 (×2): qty 1

## 2019-11-07 MED ORDER — FLUTICASONE FUROATE-VILANTEROL 200-25 MCG/INH IN AEPB: 1.0000 | INHALATION_SPRAY | Freq: Three times a day (TID) | RESPIRATORY_TRACT | Status: DC | PRN

## 2019-11-07 MED ORDER — CARVEDILOL 12.5 MG PO TABS
12.5000 mg | ORAL_TABLET | Freq: Two times a day (BID) | ORAL | Status: DC
  Administered 2019-11-07 – 2019-11-09 (×4): 12.5 mg via ORAL
  Filled 2019-11-07 (×4): qty 1

## 2019-11-07 MED ORDER — IBUPROFEN 800 MG PO TABS
800.0000 mg | ORAL_TABLET | Freq: Three times a day (TID) | ORAL | Status: DC
  Administered 2019-11-07 – 2019-11-08 (×5): 800 mg via ORAL
  Filled 2019-11-07 (×5): qty 1

## 2019-11-07 MED ORDER — UMECLIDINIUM BROMIDE 62.5 MCG/INH IN AEPB: 1.0000 | INHALATION_SPRAY | Freq: Three times a day (TID) | RESPIRATORY_TRACT | Status: DC | PRN

## 2019-11-07 NOTE — ED Provider Notes (Signed)
[facility] EMERGENCY DEPARTMENT Provider Note   CSN: [id number] Arrival date & time: 11/07/19  1051     History Chief Complaint  Patient presents with  . Leg Swelling    [name] is a 84 y.o. female.  Pt presents to the ED today with swelling to the legs and to the hands.  Pt said sx have been going on for about 1 month.  She said the tramadol is not helping.  She was recently prescribed ibuprofen which is also not helping.  Pt denies any recent falls.        Past Medical History:  Diagnosis Date  . Anxiety   . Cervical disc disorder with myelopathy, unspecified cervical region   . Chronic systolic (congestive) heart failure ([place])   . COPD (chronic obstructive pulmonary disease) ([place])   . Coronary artery disease   . Disc disease with myelopathy, cervical   . Essential hypertension   . Hemorrhoids   . Liver mass   . Lung, cysts, congenital    Left lung cyst  . Myocardial infarction ([place])   . Nephrolithiasis    Embedded  . Nonischemic cardiomyopathy ([place])    LVEF 35-40% 2015  . On home O2   . Osteoarthritis   . Thoracic ascending aortic aneurysm (HCC)    4.3 cm April 2016    Patient Active Problem List   Diagnosis Date Noted  . Acute on chronic respiratory failure with hypoxia ([place]) 04/24/2018  . Chronic stable angina ([place]) 04/24/2018  . Physical deconditioning 04/24/2018  . CAD (coronary artery disease) 02/03/2018  . Ischemic cardiomyopathy 11/25/2017  . Dizziness 04/01/2017  . COPD (chronic obstructive pulmonary disease) ([place]) 04/01/2017  . CAD S/P percutaneous coronary angioplasty 04/01/2017  . NSTEMI (non-ST elevated myocardial infarction) ([place]) 11/23/2016  . Hypertensive cardiovascular disease 10/03/2015  . Protein-calorie malnutrition, severe 07/13/2015  . Demand ischemia of myocardium ([place]) 07/13/2015  . Nonischemic cardiomyopathy ([place])   . Atypical chest pain   . Left bundle branch block   . Flu-like symptoms 07/11/2015  . Elevated  troponin 07/11/2015  . Acute bronchitis 07/11/2015  . GERD (gastroesophageal reflux disease) 07/11/2015  . Bronchospasm   . Ascending aortic aneurysm ([place]) 01/27/2015  . Chronic combined systolic and diastolic CHF (congestive heart failure) ([place]) 01/27/2015  . Chest pain at rest 01/27/2015  . Essential hypertension 01/27/2015  . Anxiety 01/27/2015  . Chronic pain 01/27/2015  . Chest pain 08/20/2013  . Hypertension 08/20/2013  . Contusion of left knee 08/07/2012  . Sprain of wrist 08/07/2012  . Liver mass 05/21/2011  . Abdominal pain 05/21/2011  . Bronchitis 05/21/2011  . De Quervain's disease (tenosynovitis) 04/02/2011  . SHOULDER PAIN 01/19/2009  . CERVICALGIA 01/19/2009  . IMPINGEMENT SYNDROME 01/19/2009    Past Surgical History:  Procedure Laterality Date  . Benign breast tumors    . CHOLECYSTECTOMY    . COLONOSCOPY    . COLONOSCOPY N/A 09/22/2014   Procedure: COLONOSCOPY;  Surgeon: Rogene Houston, MD;  [facility];  Service: Endoscopy;  Laterality: N/A;  830 -- to be done in OR under fluoro  . Complete hysterectomy    . CORONARY STENT INTERVENTION N/A 11/23/2016   Procedure: Coronary Stent Intervention;  Surgeon: Martinique, Peter M, MD;  Location: [place] CV LAB;  Service: Cardiovascular;  Laterality: N/A;  . LEFT HEART CATH AND CORONARY ANGIOGRAPHY N/A 11/23/2016   Procedure: Left Heart Cath and Coronary Angiography;  Surgeon: Martinique, Peter M, MD;  Location: [facility] INVASIVE CV  LAB;  Service: Cardiovascular;  Laterality: N/A;  . TONSILLECTOMY AND ADENOIDECTOMY       OB History    Gravida  3   Para  3   Term  3   Preterm      AB      Living  2     SAB      TAB      Ectopic      Multiple      Live Births              Family History  Problem Relation Age of Onset  . Heart disease Mother   . Aneurysm Father   . Lung cancer Brother   . Heart disease Sister   . Diabetes Brother   . Heart disease Brother     Social History   Tobacco  Use  . Smoking status: Former Smoker    Packs/day: 0.25    Years: 31.00    Pack years: 7.75    Types: Cigarettes    Start date: 05/14/1977    Quit date: 05/28/2008    Years since quitting: 11.4  . Smokeless tobacco: Never Used  Vaping Use  . Vaping Use: Never used  Substance Use Topics  . Alcohol use: No    Alcohol/week: 0.0 standard drinks  . Drug use: No    Home Medications Prior to Admission medications   Medication Sig Start Date End Date Taking? Authorizing Provider  aspirin EC 81 MG tablet Take 81 mg by mouth every morning.    Yes [provider]  atorvastatin (LIPITOR) 80 MG tablet TAKE 1 TABLET BY MOUTH ONCE DAILY AT BEDTIME. Patient taking differently: Take 80 mg by mouth daily.  09/23/19  Yes Herminio Commons, MD  carvedilol (COREG) 12.5 MG tablet Take 1 tablet (12.5 mg total) by mouth 2 (two) times daily. 09/22/19 12/21/19 Yes Herminio Commons, MD  clorazepate (TRANXENE) 7.5 MG tablet Take 7.5 mg by mouth daily as needed for anxiety. For nerves   Yes [provider]  ENTRESTO 24-26 MG TAKE 1 TABLET BY MOUTH TWICE DAILY. Patient taking differently: Take 1 tablet by mouth 2 (two) times daily.  07/23/19  Yes Herminio Commons, MD  ibuprofen (ADVIL) 800 MG tablet Take 800 mg by mouth 3 (three) times daily. 11/04/19  Yes [provider]  meclizine (ANTIVERT) 25 MG tablet Take 25 mg by mouth as needed for dizziness.   Yes [provider]  traMADol (ULTRAM) 50 MG tablet Take 50 mg by mouth daily as needed for moderate pain or severe pain. Maximum dose= 8 tablets per day   Yes [provider]  TRELEGY ELLIPTA 200-62.5-25 MCG/INH AEPB INHALE (1) PUFF BY MOUTH INTO THE LUNGS DAILY. Patient taking differently: Take 1 puff by mouth 3 (three) times daily as needed (shortness of breath).  09/14/19  Yes Collene Gobble, MD  albuterol (PROVENTIL) (2.5 MG/3ML) 0.083% nebulizer solution Take 3 mLs (2.5 mg total) by nebulization every 6 (six)  hours as needed for wheezing or shortness of breath. Patient not taking: Reported on 11/07/2019 07/13/15   Sinda Du, MD  ipratropium-albuterol (DUONEB) 0.5-2.5 (3) MG/3ML SOLN Take 3 mLs by nebulization every 6 (six) hours as needed. Patient not taking: Reported on 11/07/2019 07/08/19   [provider]  meloxicam (MOBIC) 7.5 MG tablet Take 7.5 mg by mouth daily. Patient not taking: Reported on 11/07/2019    [provider]  nitroGLYCERIN (NITROSTAT) 0.4 MG SL tablet Place 1  tablet (0.4 mg total) under the tongue every 5 (five) minutes x 3 doses as needed for chest pain. 03/26/19   Herminio Commons, MD  OXYGEN Inhale 2-2.5 L into the lungs at bedtime.  Patient not taking: Reported on 11/07/2019    [provider]  predniSONE (STERAPRED UNI-PAK 21 TAB) 10 MG (21) TBPK tablet Take 6 tabs for 2 days, then 5 for 2 days, then 4 for 2 days, then 3 for 2 days, 2 for 2 days, then 1 for 2 days 11/07/19   Isla Pence, MD  PROAIR HFA 108 (90 BASE) MCG/ACT inhaler Inhale 2 puffs into the lungs every 6 (six) hours as needed for wheezing or shortness of breath.  Patient not taking: Reported on 11/07/2019 02/08/14   [provider]    Allergies    Plavix [clopidogrel bisulfate], Montelukast sodium, Alprazolam, Codeine, Percodan [oxycodone-aspirin], and Valium  Review of Systems   Review of Systems  Musculoskeletal: Positive for myalgias.  All other systems reviewed and are negative.   Physical Exam Updated Vital Signs BP (!) 149/71 (BP Location: Left Arm)   Pulse 91   Temp 99.1 F (37.3 C) (Oral)   Resp 18   Ht 5\' 2"  (1.575 m)   Wt 72.6 kg   SpO2 97%   BMI 29.26 kg/m   Physical Exam Vitals and nursing note reviewed.  Constitutional:      Appearance: Normal appearance.  HENT:     Head: Normocephalic and atraumatic.     Right Ear: External ear normal.     Left Ear: External ear normal.     Nose: Nose normal.     Mouth/Throat:     Mouth: Mucous  membranes are moist.     Pharynx: Oropharynx is clear.  Eyes:     Extraocular Movements: Extraocular movements intact.     Conjunctiva/sclera: Conjunctivae normal.     Pupils: Pupils are equal, round, and reactive to light.  Cardiovascular:     Rate and Rhythm: Normal rate and regular rhythm.     Pulses: Normal pulses.     Heart sounds: Normal heart sounds.  Pulmonary:     Effort: Pulmonary effort is normal.     Breath sounds: Normal breath sounds.  Abdominal:     General: Abdomen is flat. Bowel sounds are normal.     Palpations: Abdomen is soft.  Musculoskeletal:     Cervical back: Normal range of motion and neck supple.     Right lower leg: Edema present.     Left lower leg: Edema present.     Comments: Swelling to arms and to legs.  Left hand with the most pain.  Skin:    General: Skin is warm.     Capillary Refill: Capillary refill takes less than 2 seconds.  Neurological:     General: No focal deficit present.     Mental Status: She is alert and oriented to person, place, and time.  Psychiatric:        Mood and Affect: Mood normal.        Behavior: Behavior normal.        Thought Content: Thought content normal.        Judgment: Judgment normal.     ED Results / Procedures / Treatments   Labs (all labs ordered are listed, but only abnormal results are displayed) Labs Reviewed  COMPREHENSIVE METABOLIC PANEL - Abnormal; Notable for the following components:      Result Value   Glucose, Bld  151 (*)    Albumin 3.4 (*)    All other components within normal limits  CBC WITH DIFFERENTIAL/PLATELET - Abnormal; Notable for the following components:   Hemoglobin 10.8 (*)    HCT 34.5 (*)    MCH 25.7 (*)    All other components within normal limits  URINALYSIS, ROUTINE W REFLEX MICROSCOPIC - Abnormal; Notable for the following components:   Specific Gravity, Urine 1.004 (*)    All other components within normal limits  BRAIN NATRIURETIC PEPTIDE - Abnormal; Notable for the  following components:   B Natriuretic Peptide 311.0 (*)    All other components within normal limits  URINE CULTURE  TROPONIN I (HIGH SENSITIVITY)  TROPONIN I (HIGH SENSITIVITY)    EKG EKG Interpretation  Date/Time:  Saturday November 07 2019 11:20:35 EDT Ventricular Rate:  82 PR Interval:    QRS Duration: 167 QT Interval:  418 QTC Calculation: 489 R Axis:   3 Text Interpretation: Sinus rhythm Left bundle branch block No significant change since last tracing Confirmed by Isla Pence [phone]) on 11/07/2019 12:06:03 PM   Radiology DG Chest 2 View  Result Date: 11/07/2019 CLINICAL DATA:  Patient complains of swelling to hands and legs x 1 month. Hx of copd, htn, liver mass, cad, EXAM: CHEST - 2 VIEW COMPARISON:  08/23/2019 FINDINGS: Mild enlargement of the cardiopericardial silhouette. No mediastinal or hilar masses or evidence of adenopathy. Lungs are hyperexpanded, but clear. No pleural effusion or pneumothorax. Skeletal structures are demineralized. Stable left shoulder prosthesis. IMPRESSION: No acute cardiopulmonary disease. Electronically Signed   By: Lajean Manes M.D.   On: 11/07/2019 12:16    Procedures Procedures (including critical care time)  Medications Ordered in ED Medications  aspirin EC tablet 81 mg (has no administration in time range)  carvedilol (COREG) tablet 12.5 mg (has no administration in time range)  clorazepate (TRANXENE) tablet 7.5 mg (has no administration in time range)  sacubitril-valsartan (ENTRESTO) 24-26 mg per tablet (has no administration in time range)  ibuprofen (ADVIL) tablet 800 mg (has no administration in time range)  meclizine (ANTIVERT) tablet 25 mg (has no administration in time range)  nitroGLYCERIN (NITROSTAT) SL tablet 0.4 mg (has no administration in time range)  traMADol (ULTRAM) tablet 50 mg (has no administration in time range)  Fluticasone-Umeclidin-Vilant 200-62.5-25 MCG/INH AEPB 1 puff (has no administration in time range)    predniSONE (DELTASONE) tablet 60 mg (has no administration in time range)  dexamethasone (DECADRON) injection 10 mg (10 mg Intravenous Given 11/07/19 1416)    ED Course  I have reviewed the triage vital signs and the nursing notes.  Pertinent labs & imaging results that were available during my care of the patient were reviewed by me and considered in my medical decision making (see chart for details).    MDM Rules/Calculators/A&P                          I think pt has an arthritis flare.  She did have someone staying with her and helping her out, but they left about 2 weeks ago.  She is having trouble getting dressed and getting around.   Pt does not want anything stronger for pain as she said she does not like how it makes her feel.  I will give her a dose of decadron in the ED and d/c with prednisone.  I will consult SW to see what can be done to help this pt.  She does  not meet admission criteria.  A home health form was filled out, but pt does not want home health.  She wants rehab.  SW requested PT eval and treat.  I ordered that, but it won't be done until tomorrow.  Prednisone ordered for here and for d/c.  Pt does have an appointment with a rheumatologist in a few weeks.   Final Clinical Impression(s) / ED Diagnoses Final diagnoses:  Arthritis  Ambulatory dysfunction    Rx / DC Orders ED Discharge Orders         Ordered    predniSONE (STERAPRED UNI-PAK 21 TAB) 10 MG (21) TBPK tablet     Discontinue  Reprint     11/07/19 1505           Isla Pence, MD 11/07/19 1554

## 2019-11-07 NOTE — Discharge Instructions (Addendum)
Our physical therapist saw you in the ER and recommended home health aid.  We ordered this for you.  You should have someone coming out to the house to help you around.  Please call your family doctor today to discuss your visit.  If you have any further issues about home health concerns, you should discuss it with your family doctor.

## 2019-11-07 NOTE — ED Triage Notes (Signed)
Patient arrived via EMS due to swelling in legs and hands.  Patient states this has been going on for about a month and feels the swelling has increased today.

## 2019-11-07 NOTE — ED Notes (Signed)
Pt requested O2 via Nasal Cannula for night time. This RN provided Pt with 2 L via Nasal Cannula, repositioned Pt in bed and provided Pt with warm blanket.

## 2019-11-07 NOTE — TOC Initial Note (Signed)
Transition of Care [facility]) - Initial/Assessment Note   Patient Details  Name: [name] MRN: [id number] Date of Birth: 1936/04/07  Transition of Care [facility]) CM/SW Contact:    Sherie Don, LCSW Phone Number: 11/07/2019, 4:15 PM  Clinical Narrative: Patient is an 84 year old female in the ED for edema. TOC received consult for HH/SNF needs. CSW met with patient in the ED to discuss patient's needs. Per patient, she has a walker, BSC, shower chair, and home O2 at home. Patient receives her O2 from [place]. Patient reported she lives at home alone and has [facility] with [facility] in the past, but patient feels she needs SNF for rehab to improve her strength. EDP agreeable to PT consult to assess for SNF. TOC to follow.  Expected Discharge Plan: Skilled Nursing Facility Barriers to Discharge: Continued Medical Work up  Patient Goals and CMS Choice Patient states their goals for this hospitalization and ongoing recovery are:: Get stronger CMS Medicare.gov Compare Post Acute Care list provided to:: Patient Choice offered to / list presented to : Patient  Expected Discharge Plan and Services Expected Discharge Plan: [place] In-house Referral: Clinical Social Work Discharge Planning Services: NA Post Acute Care Choice: [place] Living arrangements for the past 2 months: [place]  Prior Living Arrangements/Services Living arrangements for the past 2 months: Single Family Home Lives with:: Self Patient language and need for interpreter reviewed:: Yes Do you feel safe going back to the place where you live?: Yes      Need for Family Participation in Patient Care: No (Comment) Care giver support system in place?: Yes (comment) Gae Dry (daughter)) Criminal Activity/Legal Involvement Pertinent to Current Situation/Hospitalization: No - Comment as needed  Permission Sought/Granted Permission sought to share information with : Research scientist (medical) granted to share info w AGENCY: SNFs  Emotional Assessment Appearance:: Appears stated age Attitude/Demeanor/Rapport: Engaged Affect (typically observed): Appropriate, Accepting Orientation: : Oriented to Place, Oriented to Self, Oriented to  Time, Oriented to Situation Alcohol / Substance Use: Not Applicable Psych Involvement: No (comment)  Admission diagnosis:  Edema Patient Active Problem List   Diagnosis Date Noted  . Acute on chronic respiratory failure with hypoxia ([place]) 04/24/2018  . Chronic stable angina ([place]) 04/24/2018  . Physical deconditioning 04/24/2018  . CAD (coronary artery disease) 02/03/2018  . Ischemic cardiomyopathy 11/25/2017  . Dizziness 04/01/2017  . COPD (chronic obstructive pulmonary disease) ([place]) 04/01/2017  . CAD S/P percutaneous coronary angioplasty 04/01/2017  . NSTEMI (non-ST elevated myocardial infarction) ([place]) 11/23/2016  . Hypertensive cardiovascular disease 10/03/2015  . Protein-calorie malnutrition, severe 07/13/2015  . Demand ischemia of myocardium ([place]) 07/13/2015  . Nonischemic cardiomyopathy ([place])   . Atypical chest pain   . Left bundle branch block   . Flu-like symptoms 07/11/2015  . Elevated troponin 07/11/2015  . Acute bronchitis 07/11/2015  . GERD (gastroesophageal reflux disease) 07/11/2015  . Bronchospasm   . Ascending aortic aneurysm ([place]) 01/27/2015  . Chronic combined systolic and diastolic CHF (congestive heart failure) ([place]) 01/27/2015  . Chest pain at rest 01/27/2015  . Essential hypertension 01/27/2015  . Anxiety 01/27/2015  . Chronic pain 01/27/2015  . Chest pain 08/20/2013  . Hypertension 08/20/2013  . Contusion of left knee 08/07/2012  . Sprain of wrist 08/07/2012  . Liver mass 05/21/2011  . Abdominal pain 05/21/2011  . Bronchitis 05/21/2011  . De Quervain's disease (tenosynovitis) 04/02/2011  . SHOULDER PAIN 01/19/2009  . CERVICALGIA 01/19/2009  . IMPINGEMENT SYNDROME  01/19/2009    PCP:  Denyce Robert, [place] [facility]:   [facility] [place] [address] Phone: [phone] Fax: [phone]  Readmission Risk Interventions No flowsheet data found.

## 2019-11-08 ENCOUNTER — Encounter (HOSPITAL_COMMUNITY): Payer: Self-pay | Admitting: *Deleted

## 2019-11-08 DIAGNOSIS — M1909 Primary osteoarthritis, other specified site: Secondary | ICD-10-CM | POA: Diagnosis not present

## 2019-11-08 NOTE — ED Notes (Signed)
Pt states she is interested in [facility] [place] or placement in [place] where her sister is a resident

## 2019-11-08 NOTE — Evaluation (Signed)
Physical Therapy Evaluation Patient Details Name: [name] MRN: [id number] DOB: [birth year] Today's Date: 11/08/2019   History of Present Illness  PT presented to ER with complaints of swelling.   Clinical Impression  PT needed two short breaks with ambulation but states  That her home is very small     Follow Up Recommendations  [facility]    Equipment Recommendations   none     Recommendations for Other Services   none    Precautions / Restrictions Precautions Precautions: None Restrictions Weight Bearing Restrictions: No      Mobility  Bed Mobility    PT sitting in chair but per nursing minimal assist  Needed.               Transfers Overall transfer level: Modified independent                  Ambulation/Gait Ambulation/Gait assistance: Modified independent (Device/Increase time) Gait Distance (Feet): 100 Feet Assistive device: Rolling walker (2 wheeled)       General Gait Details: Pt had to have two small rest breaks.            Pertinent Vitals/Pain      Home Living Family/patient expects to be discharged to:: Unsure                 Additional Comments: PT lived with grandaughter but is alone at this time.     Prior Function Level of Independence: Independent with assistive device(s)                  Extremity/Trunk Assessment        Lower Extremity Assessment Lower Extremity Assessment: Generalized weakness       Communication   Communication: No difficulties            Assessment/Plan    PT Assessment All further PT needs can be met in the next venue of care  PT Problem List Decreased strength;Decreased activity tolerance;Decreased balance       PT Treatment Interventions      PT Goals (Current goals can be found in the Care Plan section)  Acute Rehab PT Goals Patient Stated Goal: TO be able to walk withour getting tired  PT Goal Formulation: With patient Time For Goal Achievement:  11/11/19 Potential to Achieve Goals: Good    Frequency  2x a week            AM-PAC PT "6 Clicks" Mobility  Outcome Measure Help needed turning from your back to your side while in a flat bed without using bedrails?: A Little Help needed moving from lying on your back to sitting on the side of a flat bed without using bedrails?: A Little Help needed moving to and from a bed to a chair (including a wheelchair)?: None Help needed standing up from a chair using your arms (e.g., wheelchair or bedside chair)?: None Help needed to walk in hospital room?: None Help needed climbing 3-5 steps with a railing? : A Little 6 Click Score: 21    End of Session Equipment Utilized During Treatment: Gait belt   Patient left: in chair;with call bell/phone within reach   PT Visit Diagnosis: Unsteadiness on feet (R26.81)    Time: 1200-1230 PT Time Calculation (min) (ACUTE ONLY): 30 min   Charges:   PT Evaluation $PT Eval Low Complexity: [place], PT CLT [phone] 11/08/2019, 12:43 PM

## 2019-11-08 NOTE — ED Notes (Signed)
PT at bedside.

## 2019-11-08 NOTE — ED Notes (Signed)
PT in room

## 2019-11-09 DIAGNOSIS — M1909 Primary osteoarthritis, other specified site: Secondary | ICD-10-CM | POA: Diagnosis not present

## 2019-11-09 NOTE — TOC Transition Note (Signed)
Transition of Care [facility]) - CM/SW Discharge Note  Patient Details  Name: [name] MRN: [id number] Date of Birth: 03-16-1936  Transition [facility] Care [facility]) CM/SW Contact:  Sherie Don, LCSW Phone Number: 11/09/2019, 10:19 AM  Clinical Narrative: Per PT evaluation, HH is recommended and not SNF. CSW spoke with patient regarding [place]. Patient agreeable to referral to [facility]. CSW received approval for cab voucher. CSW called [employer] to discharge patient home. CSW spoke with Jenny Reichmann with [facility] for [facility] referral. [facility] accepted referral. CSW signing off.  Final next level of care: [place] Barriers to Discharge: Barriers Resolved  Patient Goals and CMS Choice Patient states their goals for this hospitalization and ongoing recovery are:: Get stronger CMS Medicare.gov Compare Post Acute Care list provided to:: Patient Choice offered to / list presented to : Patient  Discharge Plan and Services In-house Referral: Clinical Social Work Discharge Planning Services: NA Post Acute Care Choice: Skilled Nursing Facility          DME Arranged: N/A DME Agency: NA HH Arranged: PT HH Agency: [place] Date [facility] Agency Contacted: 11/09/19 Time [place]: 66 Representative spoke with at [place]: [place]  Readmission Risk Interventions No flowsheet data found.

## 2019-11-09 NOTE — ED Provider Notes (Signed)
84 yo female here boarding in ED with chronic leg and hand pain and swelling.  Patient was pending PT evaluation for SNF placement, and had this completed yesterday.  Per PT and SW note, patient was felt to be appropriate for home health instead of requiring SNF.  Patient is ambulatory at this time.  Home health orders placed.  Okay for discharge   Wyvonnia Dusky, MD 11/09/19 [phone]

## 2019-11-10 DIAGNOSIS — R5382 Chronic fatigue, unspecified: Secondary | ICD-10-CM | POA: Diagnosis not present

## 2019-11-10 DIAGNOSIS — E663 Overweight: Secondary | ICD-10-CM | POA: Diagnosis not present

## 2019-11-10 DIAGNOSIS — M2550 Pain in unspecified joint: Secondary | ICD-10-CM | POA: Diagnosis not present

## 2019-11-10 DIAGNOSIS — Z111 Encounter for screening for respiratory tuberculosis: Secondary | ICD-10-CM | POA: Diagnosis not present

## 2019-11-10 DIAGNOSIS — Z6829 Body mass index (BMI) 29.0-29.9, adult: Secondary | ICD-10-CM | POA: Diagnosis not present

## 2019-11-10 LAB — URINE CULTURE: Culture: >=100000 — AB

## 2019-11-11 NOTE — Progress Notes (Signed)
ED Antimicrobial Stewardship Positive Culture Follow Up   [name] is an 84 y.o. female who presented to [facility] on 11/07/2019 with a chief complaint of  Chief Complaint  Patient presents with  . Leg Swelling    Recent Results (from the past 720 hour(s))  Urine culture     Status: Abnormal   Collection Time: 11/07/19 11:18 AM   Specimen: Urine, Clean Catch  Result Value Ref Range Status   Specimen Description   Final    URINE, CLEAN CATCH Performed at [facility], [address] [place], [place] [address]    Special Requests   Final    NONE Performed at [facility], [address] [place], [place] [address]    Culture (A)  Final    >=100,000 COLONIES/mL AEROCOCCUS URINAE Standardized susceptibility testing for this organism is not available. Performed at [facility], [place] [address], [place], [place] [address]    Report Status 11/10/2019 FINAL  Final    [x]  Patient discharged originally without antimicrobial agent  New antibiotic prescription: No further treatment indicated at this time for asymptomatic bacteriuria.  ED Provider: Alfredia Client, PA-C   [name] 11/11/2019, 9:43 AM Clinical Pharmacist Monday - Friday phone -  [phone] Saturday - Sunday phone - [phone]

## 2019-12-04 DIAGNOSIS — Z6830 Body mass index (BMI) 30.0-30.9, adult: Secondary | ICD-10-CM | POA: Diagnosis not present

## 2019-12-04 DIAGNOSIS — M2550 Pain in unspecified joint: Secondary | ICD-10-CM | POA: Diagnosis not present

## 2019-12-04 DIAGNOSIS — R5382 Chronic fatigue, unspecified: Secondary | ICD-10-CM | POA: Diagnosis not present

## 2019-12-04 DIAGNOSIS — Z111 Encounter for screening for respiratory tuberculosis: Secondary | ICD-10-CM | POA: Diagnosis not present

## 2019-12-04 DIAGNOSIS — E669 Obesity, unspecified: Secondary | ICD-10-CM | POA: Diagnosis not present

## 2019-12-04 DIAGNOSIS — M0609 Rheumatoid arthritis without rheumatoid factor, multiple sites: Secondary | ICD-10-CM | POA: Diagnosis not present

## 2019-12-08 DIAGNOSIS — E785 Hyperlipidemia, unspecified: Secondary | ICD-10-CM | POA: Diagnosis not present

## 2019-12-08 DIAGNOSIS — I10 Essential (primary) hypertension: Secondary | ICD-10-CM | POA: Diagnosis not present

## 2019-12-08 DIAGNOSIS — R7303 Prediabetes: Secondary | ICD-10-CM | POA: Diagnosis not present

## 2019-12-10 ENCOUNTER — Other Ambulatory Visit: Payer: Self-pay

## 2019-12-10 ENCOUNTER — Ambulatory Visit (HOSPITAL_COMMUNITY)
Admission: RE | Admit: 2019-12-10 | Discharge: 2019-12-10 | Disposition: A | Discharge status: Home / Self Care | Payer: Medicare Other | Source: Ambulatory Visit | Attending: Family Medicine | Admitting: Family Medicine

## 2019-12-10 DIAGNOSIS — Z9981 Dependence on supplemental oxygen: Secondary | ICD-10-CM | POA: Diagnosis not present

## 2019-12-10 DIAGNOSIS — I5022 Chronic systolic (congestive) heart failure: Secondary | ICD-10-CM | POA: Diagnosis not present

## 2019-12-10 DIAGNOSIS — E785 Hyperlipidemia, unspecified: Secondary | ICD-10-CM | POA: Diagnosis not present

## 2019-12-10 DIAGNOSIS — R9431 Abnormal electrocardiogram [ECG] [EKG]: Secondary | ICD-10-CM | POA: Insufficient documentation

## 2019-12-10 DIAGNOSIS — I447 Left bundle-branch block, unspecified: Secondary | ICD-10-CM | POA: Diagnosis not present

## 2019-12-10 DIAGNOSIS — J449 Chronic obstructive pulmonary disease, unspecified: Secondary | ICD-10-CM | POA: Diagnosis not present

## 2019-12-10 DIAGNOSIS — I25709 Atherosclerosis of coronary artery bypass graft(s), unspecified, with unspecified angina pectoris: Secondary | ICD-10-CM | POA: Diagnosis not present

## 2019-12-10 DIAGNOSIS — Z79899 Other long term (current) drug therapy: Secondary | ICD-10-CM | POA: Diagnosis not present

## 2019-12-10 DIAGNOSIS — E119 Type 2 diabetes mellitus without complications: Secondary | ICD-10-CM | POA: Diagnosis not present

## 2019-12-10 DIAGNOSIS — I10 Essential (primary) hypertension: Secondary | ICD-10-CM | POA: Diagnosis not present

## 2019-12-11 ENCOUNTER — Telehealth: Payer: Self-pay | Admitting: Student

## 2019-12-11 NOTE — Telephone Encounter (Signed)
New message    Patient checking on test results from yesterday test

## 2019-12-11 NOTE — Telephone Encounter (Signed)
Pt informed that the ordering provider will have to notify her of results.

## 2019-12-15 ENCOUNTER — Telehealth: Payer: Self-pay | Admitting: Student

## 2019-12-15 NOTE — Telephone Encounter (Signed)
Pt asking if her Dentist sent test results to this office   Please call [phone]   Thanks renee

## 2019-12-15 NOTE — Telephone Encounter (Signed)
Patient states she has no dental issues. She is awaiting a call from her pcp

## 2019-12-16 DIAGNOSIS — R0789 Other chest pain: Secondary | ICD-10-CM | POA: Diagnosis not present

## 2019-12-16 DIAGNOSIS — R0689 Other abnormalities of breathing: Secondary | ICD-10-CM | POA: Diagnosis not present

## 2019-12-16 DIAGNOSIS — R079 Chest pain, unspecified: Secondary | ICD-10-CM | POA: Diagnosis not present

## 2019-12-16 DIAGNOSIS — R0902 Hypoxemia: Secondary | ICD-10-CM | POA: Diagnosis not present

## 2019-12-16 DIAGNOSIS — R42 Dizziness and giddiness: Secondary | ICD-10-CM | POA: Diagnosis not present

## 2019-12-21 IMAGING — CR PORTABLE CHEST - 1 VIEW
1 series · 2 of 2 positions shown · non-contrast
Comparison: 07/25/2018

CLINICAL DATA: Cough and congestion for 3 days.

EXAM:
PORTABLE CHEST 1 VIEW

[Series 1: portable · 0.17mm/px · 2 of 2 slices shown]
[im 1/2]
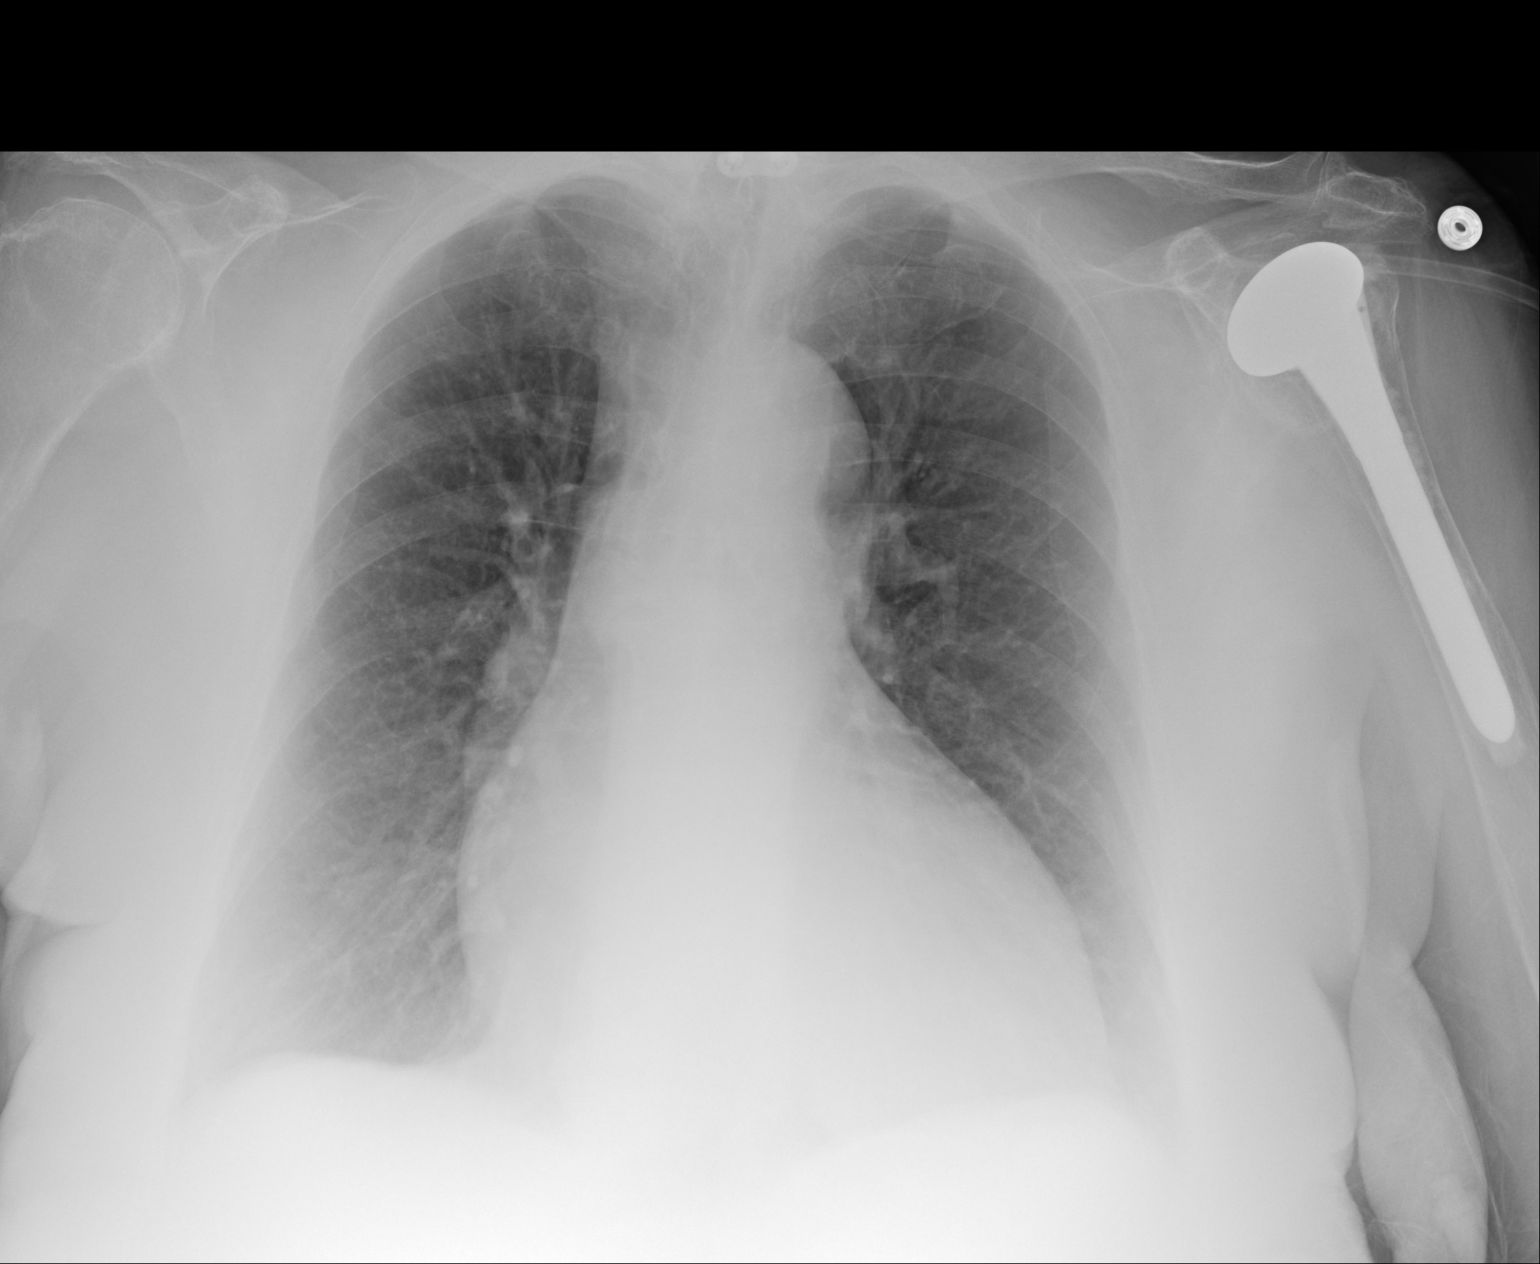
[im 2/2]
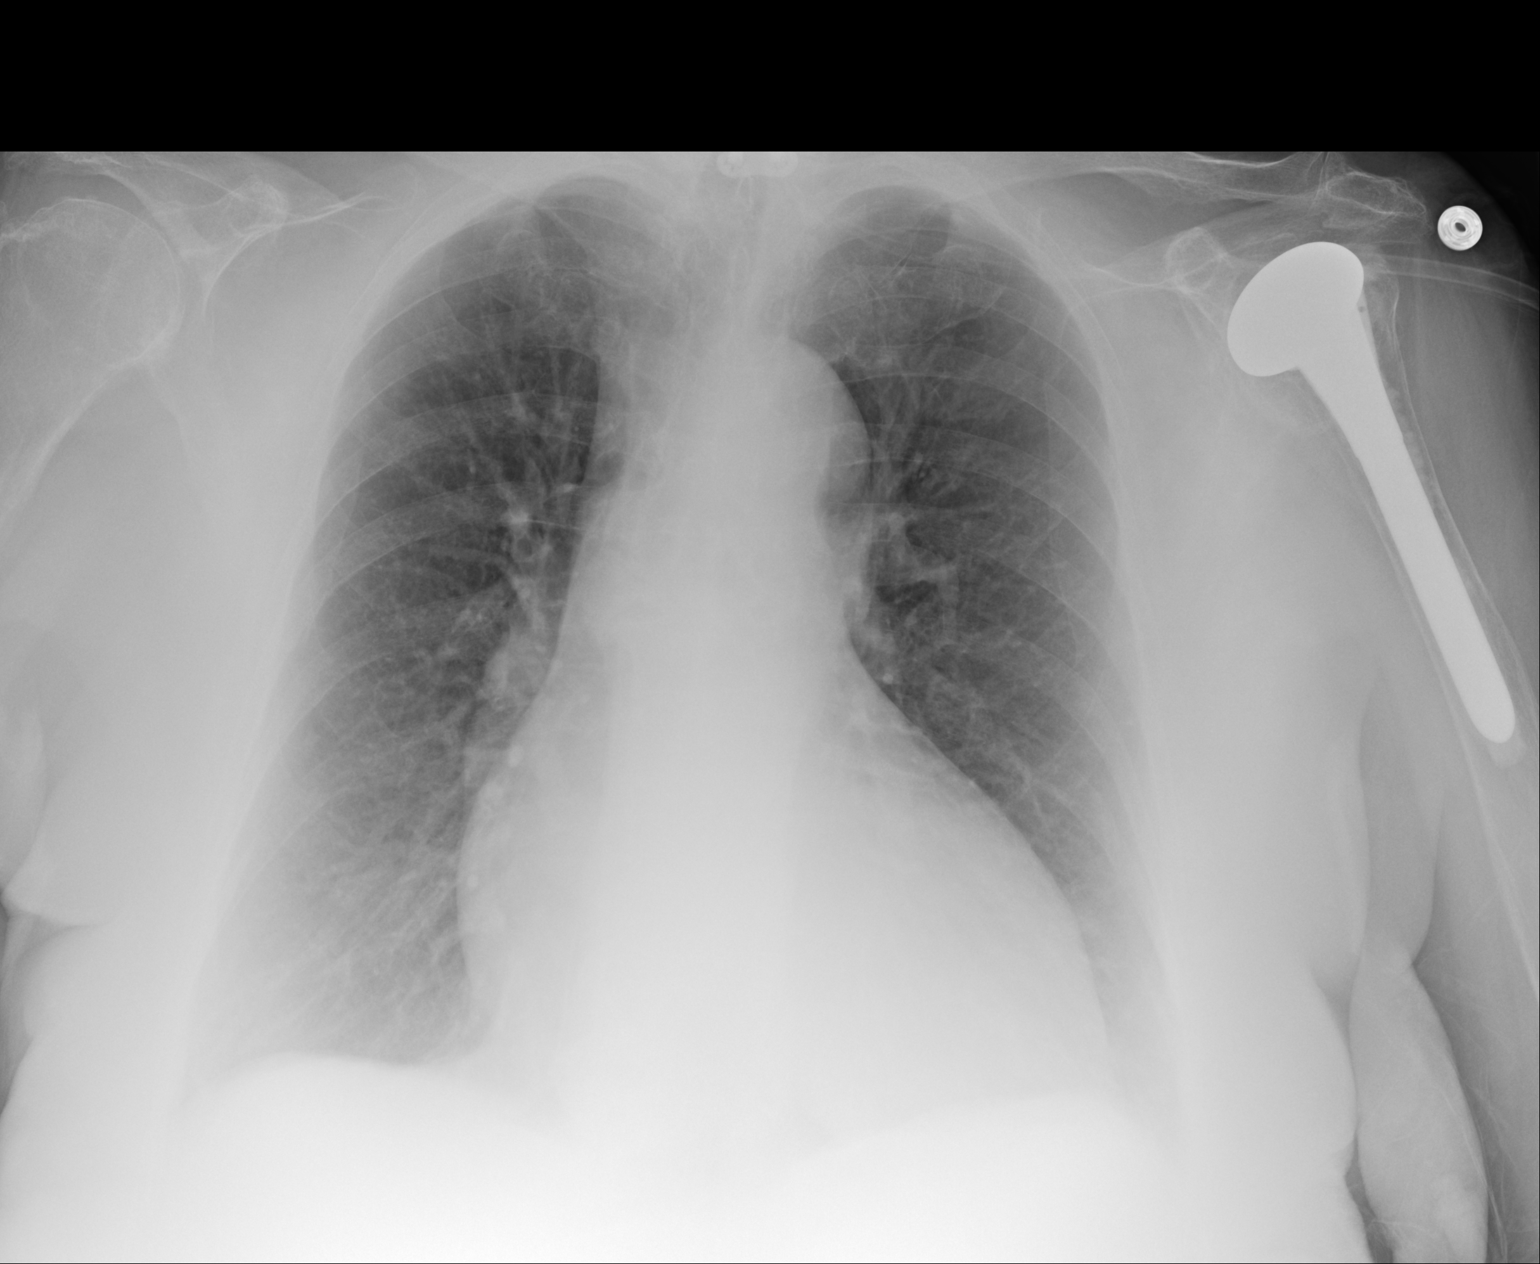

[2 of 2 positions shown; findings below may reference images not displayed]

FINDINGS: Unchanged mild cardiomegaly. Unchanged mediastinal contours. No
pulmonary edema. Mild chronic hyperinflation. No focal airspace
disease, pleural effusion, or pneumothorax. Left shoulder
arthroplasty. Surgical hardware in the lower cervical spine.
IMPRESSION: Stable mild cardiomegaly and hyperinflation without acute
abnormality.

## 2020-01-15 DIAGNOSIS — Z111 Encounter for screening for respiratory tuberculosis: Secondary | ICD-10-CM | POA: Diagnosis not present

## 2020-01-15 DIAGNOSIS — R5382 Chronic fatigue, unspecified: Secondary | ICD-10-CM | POA: Diagnosis not present

## 2020-01-15 DIAGNOSIS — M2550 Pain in unspecified joint: Secondary | ICD-10-CM | POA: Diagnosis not present

## 2020-01-15 DIAGNOSIS — M0609 Rheumatoid arthritis without rheumatoid factor, multiple sites: Secondary | ICD-10-CM | POA: Diagnosis not present

## 2020-01-15 DIAGNOSIS — Z6831 Body mass index (BMI) 31.0-31.9, adult: Secondary | ICD-10-CM | POA: Diagnosis not present

## 2020-01-15 DIAGNOSIS — E669 Obesity, unspecified: Secondary | ICD-10-CM | POA: Diagnosis not present

## 2020-02-23 ENCOUNTER — Other Ambulatory Visit: Payer: Self-pay

## 2020-02-23 ENCOUNTER — Ambulatory Visit (INDEPENDENT_AMBULATORY_CARE_PROVIDER_SITE_OTHER): Payer: Medicare Other | Admitting: Pulmonary Disease

## 2020-02-23 ENCOUNTER — Encounter: Payer: Self-pay | Admitting: Pulmonary Disease

## 2020-02-23 VITALS — BP 130/72 | HR 66 | Temp 97.6°F | Ht 62.0 in | Wt 171.0 lb

## 2020-02-23 DIAGNOSIS — J449 Chronic obstructive pulmonary disease, unspecified: Secondary | ICD-10-CM

## 2020-02-23 DIAGNOSIS — I25118 Atherosclerotic heart disease of native coronary artery with other forms of angina pectoris: Secondary | ICD-10-CM

## 2020-02-23 DIAGNOSIS — J301 Allergic rhinitis due to pollen: Secondary | ICD-10-CM

## 2020-02-23 DIAGNOSIS — J9611 Chronic respiratory failure with hypoxia: Secondary | ICD-10-CM | POA: Diagnosis not present

## 2020-02-23 MED ORDER — LEVOCETIRIZINE DIHYDROCHLORIDE 5 MG PO TABS: 5.0000 mg | ORAL_TABLET | Freq: Every evening | ORAL | 1 refills | Status: DC

## 2020-02-23 MED ORDER — MONTELUKAST SODIUM 10 MG PO TABS: 10.0000 mg | ORAL_TABLET | Freq: Every day | ORAL | 5 refills | Status: DC

## 2020-02-23 MED ORDER — TRELEGY ELLIPTA 200-62.5-25 MCG/INH IN AEPB
1.0000 | INHALATION_SPRAY | Freq: Every day | RESPIRATORY_TRACT | 0 refills | Status: DC
Start: 2020-02-23 — End: 2020-10-25

## 2020-02-23 MED ORDER — FLUTICASONE PROPIONATE 50 MCG/ACT NA SUSP
1.0000 | Freq: Every day | NASAL | 2 refills | Status: DC
Start: 2020-02-23 — End: 2021-01-24

## 2020-02-23 NOTE — Progress Notes (Signed)
[facility]  Chief Complaint  Patient presents with  . Follow-up    shortness of breath with exertion for past 3 weeks    Constitutional:  BP 130/72 (BP Location: Left Arm, Cuff Size: Normal)   Pulse 66   Temp 97.6 F (36.4 C) (Other (Comment)) Comment (Src): wrist  Ht 5\' 2"  (1.575 m)   Wt 171 lb (77.6 kg)   SpO2 97% Comment: Room air  BMI 31.28 kg/m   Past Medical History:  Congenital lung cyst, HTN, CAD s/p DES, ischemic cardiomyopathy  Past Surgical History:  Her  has a past surgical history that includes Tonsillectomy and adenoidectomy; Complete hysterectomy; Benign breast tumors; Cholecystectomy; Colonoscopy; Colonoscopy (N/A, 09/22/2014); LEFT HEART CATH AND CORONARY ANGIOGRAPHY (N/A, 11/23/2016); and CORONARY STENT INTERVENTION (N/A, 11/23/2016).  Brief Summary:  [name] is a 84 y.o. female former smoker with COPD and chronic respiratory failure with hypoxia..       Subjective:   She was previously seen by Dr. Luan Pulling and Dr. Lamonte Sakai.  She has more cough and congestion over the past few weeks.  Seems to correlate with change of weather.  She has sinus congestion and post nasal drip.  She gets allergies in the Fall.  She has coughed up plugs sometimes.  Feels that trelegy has helped.  No longer taking singulair.  No on any allergy pills or nose sprays.  Using 2 liters oxygen with exertion and sleep at night.  Physical Exam:   Appearance - well kempt   ENMT - no sinus tenderness, clear nasal drainage, no oral exudate, no LAN, Mallampati 2 airway, no stridor, TM clear b/l, wears hearing aides  Respiratory - decreased breath sounds bilaterally, no wheezing or rales  CV - s1s2 regular rate and rhythm, no murmurs  Ext - no clubbing, no edema  Skin - no rashes  Psych - normal mood and affect   Pulmonary testing:   PFT 08/31/11 >> FEV1 1.44 (70%), FEV1% 75, TLC 5.32 (108%), DLCO 61%  Cardiac Tests:   Echo  12/17/18 >> EF 45%, mod LVH  Social History:  She  reports that she quit smoking about 11 years ago. Her smoking use included cigarettes. She started smoking about 42 years ago. She has a 0.75 pack-year smoking history. She has never used smokeless tobacco. She reports that she does not drink alcohol and does not use drugs.  Family History:  Her family history includes Aneurysm in her father; Diabetes in her brother; Heart disease in her brother, mother, and sister; Lung cancer in her brother.     Assessment/Plan:   COPD with asthma. - continue trelegy 200; samples given - prn albuterol - no advantage of being on ipratropium while on trelegy >> d/c duoneb  Upper airway cough from post nasal drip in setting of seasonal allergic rhinitis. - seems to be main issue at present - xyzal 5 mg nightly for one week, then prn - flonase 1 spray in each nostril daily for 2 weeks, then prn - singulair 10 mg nightly for 30 days, then prn  Chronic respiratory failure with hypoxia from COPD. - continue 2 liters with exertion and sleep at night  CAD, ischemic CM, HTN, Thoracic aortic aneurysm. - followed by [place]  Time Spent Involved in Patient Care on Day of Examination:  33 minutes  Follow up:  Patient Instructions  Flonase nasal spray 1 spray in each nostril daily for 2 weeks, then as needed  Xzyal allergy pill 5 mg nightly for 1 week, then as needed  Singulair 10 mg pill nightly for 1 month, then as needed  Trelegy one puff daily, and rinse your mouth after each use  Albuterol every 4 to 6 hours as needed for cough, wheeze, chest congestion, or shortness of breath  Follow up in 4 months   Medication List:   Allergies as of 02/23/2020      Reactions   Plavix [clopidogrel Bisulfate] Itching   Severe itching   Alprazolam Nausea And Vomiting   Codeine Nausea And Vomiting   Percodan [oxycodone-aspirin] Nausea And Vomiting   Valium Nausea And Vomiting      Medication  List       Accurate as of February 23, 2020 10:59 AM. If you have any questions, ask your nurse or doctor.        STOP taking these medications   ipratropium-albuterol 0.5-2.5 (3) MG/3ML Soln Commonly known as: DUONEB Stopped by: Chesley Mires, MD   predniSONE 10 MG (21) Tbpk tablet Commonly known as: STERAPRED UNI-PAK 21 TAB Stopped by: Chesley Mires, MD     TAKE these medications   aspirin EC 81 MG tablet Take 81 mg by mouth every morning.   atorvastatin 80 MG tablet Commonly known as: LIPITOR TAKE 1 TABLET BY MOUTH ONCE DAILY AT BEDTIME. What changed: when to take this   carvedilol 12.5 MG tablet Commonly known as: COREG Take 1 tablet (12.5 mg total) by mouth 2 (two) times daily.   clorazepate 7.5 MG tablet Commonly known as: TRANXENE Take 7.5 mg by mouth daily as needed for anxiety. For nerves   Entresto 24-26 MG Generic drug: sacubitril-valsartan TAKE 1 TABLET BY MOUTH TWICE DAILY.   fluticasone 50 MCG/ACT nasal spray Commonly known as: FLONASE Place 1 spray into both nostrils daily. Started by: Chesley Mires, MD   ibuprofen 800 MG tablet Commonly known as: ADVIL Take 800 mg by mouth 3 (three) times daily.   levocetirizine 5 MG tablet Commonly known as: Xyzal Take 1 tablet (5 mg total) by mouth every evening. Started by: Chesley Mires, MD   meclizine 25 MG tablet Commonly known as: ANTIVERT Take 25 mg by mouth as needed for dizziness.   meloxicam 7.5 MG tablet Commonly known as: MOBIC Take 7.5 mg by mouth daily.   montelukast 10 MG tablet Commonly known as: SINGULAIR Take 1 tablet (10 mg total) by mouth at bedtime. Started by: Chesley Mires, MD   nitroGLYCERIN 0.4 MG SL tablet Commonly known as: NITROSTAT Place 1 tablet (0.4 mg total) under the tongue every 5 (five) minutes x 3 doses as needed for chest pain.   OXYGEN Inhale 2-2.5 L into the lungs at bedtime.   ProAir HFA 108 (90 Base) MCG/ACT inhaler Generic drug: albuterol Inhale 2 puffs into the  lungs every 6 (six) hours as needed for wheezing or shortness of breath.   albuterol (2.5 MG/3ML) 0.083% nebulizer solution Commonly known as: PROVENTIL Take 3 mLs (2.5 mg total) by nebulization every 6 (six) hours as needed for wheezing or shortness of breath.   traMADol 50 MG tablet Commonly known as: ULTRAM Take 50 mg by mouth daily as needed for moderate pain or severe pain. Maximum dose= 8 tablets per day   Trelegy Ellipta 200-62.5-25 MCG/INH Aepb Generic drug: Fluticasone-Umeclidin-Vilant INHALE (1) PUFF BY MOUTH INTO THE LUNGS DAILY. What changed: See the new instructions.       Signature:  Chesley Mires, MD [place] Pager - [phone])  [phone] 02/23/2020, 10:59 AM

## 2020-02-23 NOTE — Addendum Note (Signed)
Addended by: Merrilee Seashore on: 02/23/2020 11:14 AM   Modules accepted: Orders

## 2020-02-23 NOTE — Patient Instructions (Signed)
Flonase nasal spray 1 spray in each nostril daily for 2 weeks, then as needed  Xzyal allergy pill 5 mg nightly for 1 week, then as needed  Singulair 10 mg pill nightly for 1 month, then as needed  Trelegy one puff daily, and rinse your mouth after each use  Albuterol every 4 to 6 hours as needed for cough, wheeze, chest congestion, or shortness of breath  Follow up in 4 months

## 2020-02-24 ENCOUNTER — Other Ambulatory Visit: Payer: Self-pay | Admitting: Pulmonary Disease

## 2020-03-01 DIAGNOSIS — Z6830 Body mass index (BMI) 30.0-30.9, adult: Secondary | ICD-10-CM | POA: Diagnosis not present

## 2020-03-01 DIAGNOSIS — Z111 Encounter for screening for respiratory tuberculosis: Secondary | ICD-10-CM | POA: Diagnosis not present

## 2020-03-01 DIAGNOSIS — E669 Obesity, unspecified: Secondary | ICD-10-CM | POA: Diagnosis not present

## 2020-03-01 DIAGNOSIS — M2550 Pain in unspecified joint: Secondary | ICD-10-CM | POA: Diagnosis not present

## 2020-03-01 DIAGNOSIS — R5382 Chronic fatigue, unspecified: Secondary | ICD-10-CM | POA: Diagnosis not present

## 2020-03-01 DIAGNOSIS — M0609 Rheumatoid arthritis without rheumatoid factor, multiple sites: Secondary | ICD-10-CM | POA: Diagnosis not present

## 2020-03-04 DIAGNOSIS — I10 Essential (primary) hypertension: Secondary | ICD-10-CM | POA: Diagnosis not present

## 2020-03-04 DIAGNOSIS — E119 Type 2 diabetes mellitus without complications: Secondary | ICD-10-CM | POA: Diagnosis not present

## 2020-03-04 DIAGNOSIS — E785 Hyperlipidemia, unspecified: Secondary | ICD-10-CM | POA: Diagnosis not present

## 2020-03-09 DIAGNOSIS — E785 Hyperlipidemia, unspecified: Secondary | ICD-10-CM | POA: Diagnosis not present

## 2020-03-09 DIAGNOSIS — F419 Anxiety disorder, unspecified: Secondary | ICD-10-CM | POA: Diagnosis not present

## 2020-03-09 DIAGNOSIS — Z6830 Body mass index (BMI) 30.0-30.9, adult: Secondary | ICD-10-CM | POA: Diagnosis not present

## 2020-03-09 DIAGNOSIS — I2510 Atherosclerotic heart disease of native coronary artery without angina pectoris: Secondary | ICD-10-CM | POA: Diagnosis not present

## 2020-03-09 DIAGNOSIS — E668 Other obesity: Secondary | ICD-10-CM | POA: Diagnosis not present

## 2020-03-09 DIAGNOSIS — J9611 Chronic respiratory failure with hypoxia: Secondary | ICD-10-CM | POA: Diagnosis not present

## 2020-03-09 DIAGNOSIS — J449 Chronic obstructive pulmonary disease, unspecified: Secondary | ICD-10-CM | POA: Diagnosis not present

## 2020-03-09 DIAGNOSIS — I5022 Chronic systolic (congestive) heart failure: Secondary | ICD-10-CM | POA: Diagnosis not present

## 2020-03-09 DIAGNOSIS — I10 Essential (primary) hypertension: Secondary | ICD-10-CM | POA: Diagnosis not present

## 2020-03-09 DIAGNOSIS — I447 Left bundle-branch block, unspecified: Secondary | ICD-10-CM | POA: Diagnosis not present

## 2020-03-09 DIAGNOSIS — Z23 Encounter for immunization: Secondary | ICD-10-CM | POA: Diagnosis not present

## 2020-03-09 DIAGNOSIS — E119 Type 2 diabetes mellitus without complications: Secondary | ICD-10-CM | POA: Diagnosis not present

## 2020-03-09 IMAGING — US US EXTREM LOW VENOUS*L*
1 series · 13 of 24 positions shown · non-contrast
Comparison: NO PRIOR.

CLINICAL DATA: Left lower extremity pain.



[Series 1: us extrem low venous*left* · 13 of 38 slices shown]
[im 1/38]
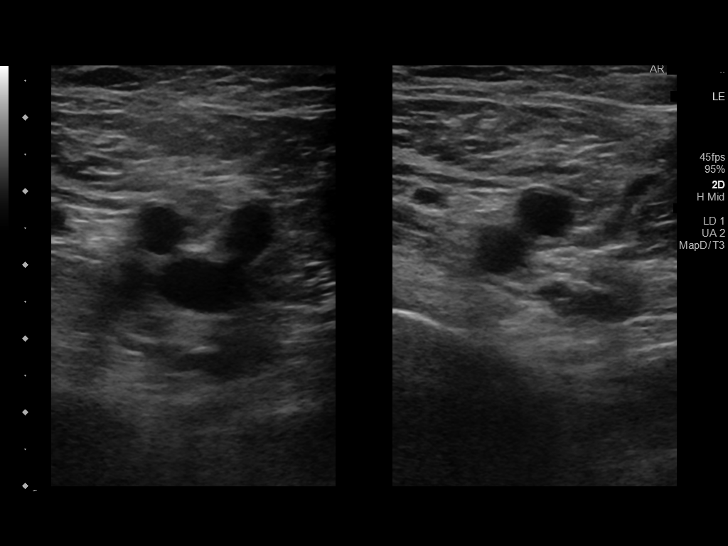
[im 4/38]
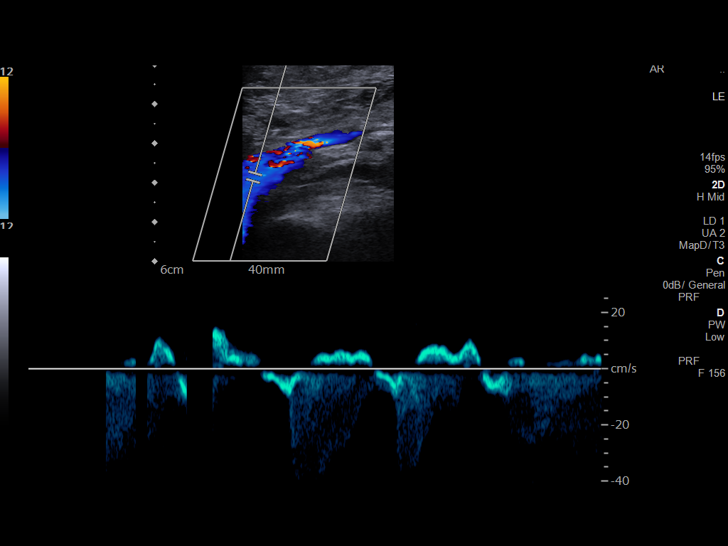
[im 7/38]
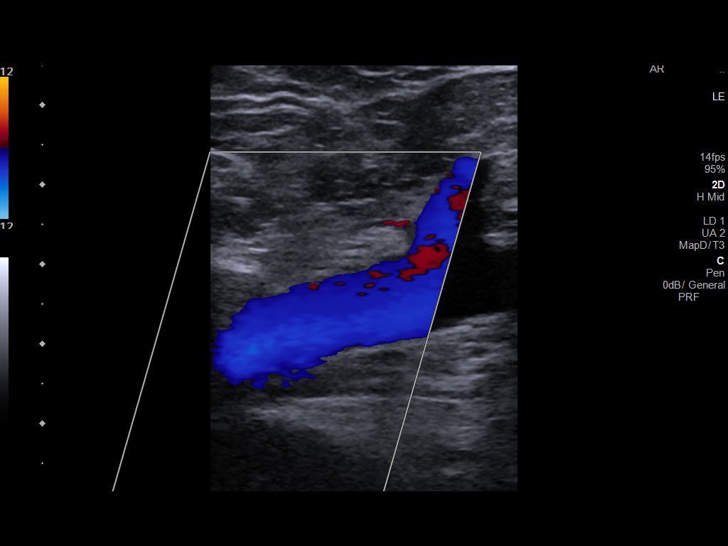
[im 10/38]
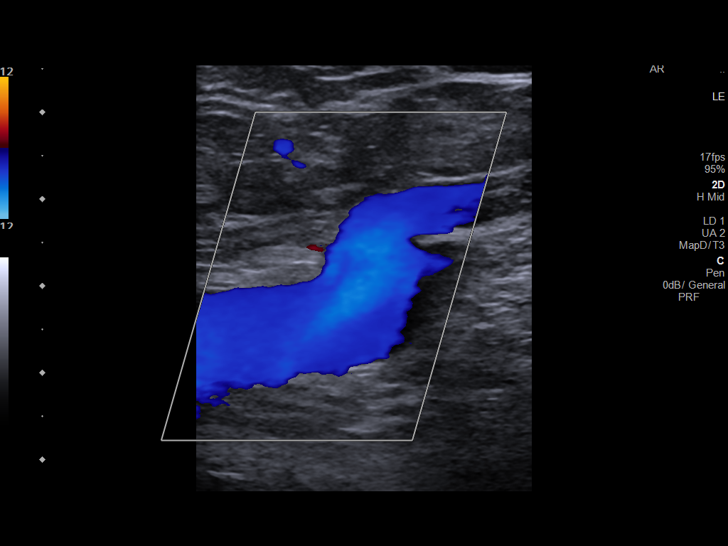
[im 13/38]
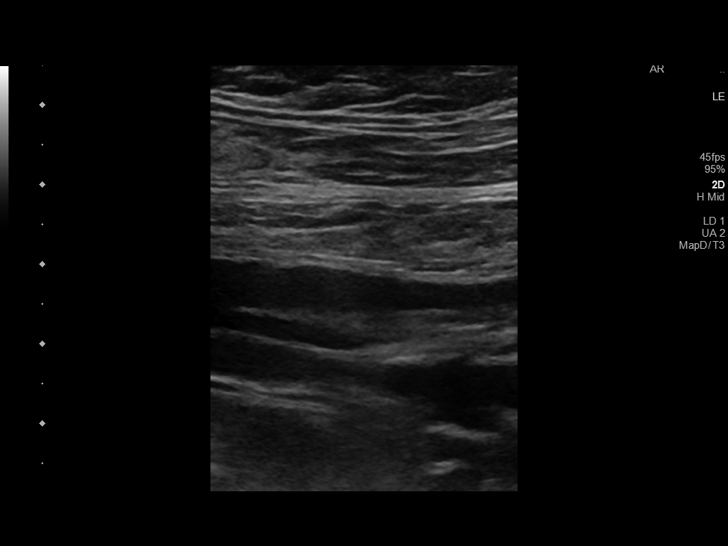
[im 17/38]
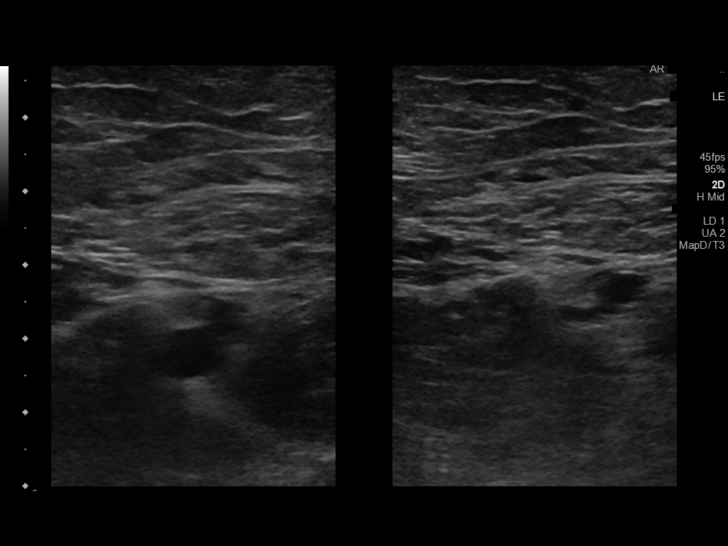
[im 20/38]
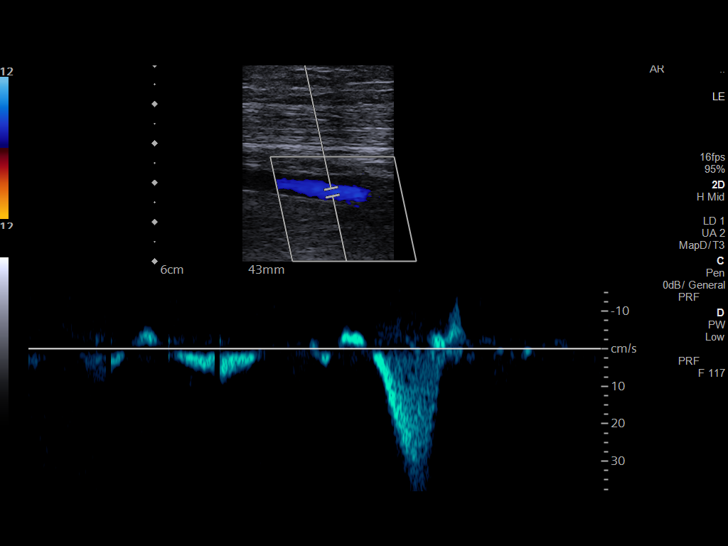
[im 21/38]
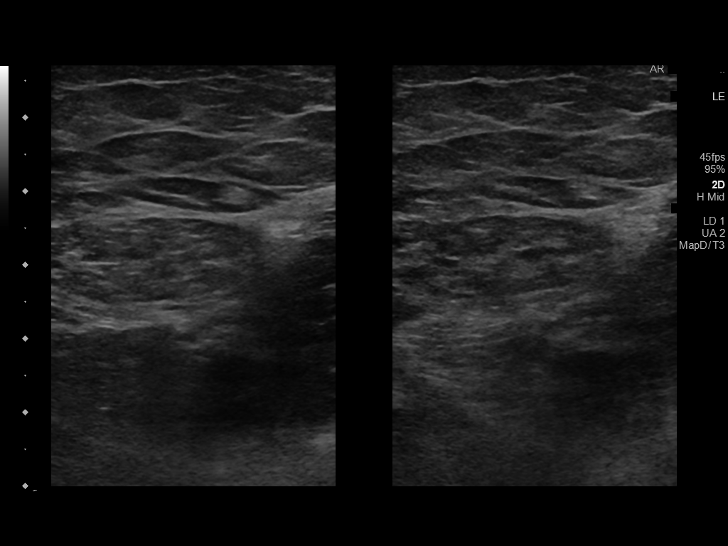
[im 25/38]
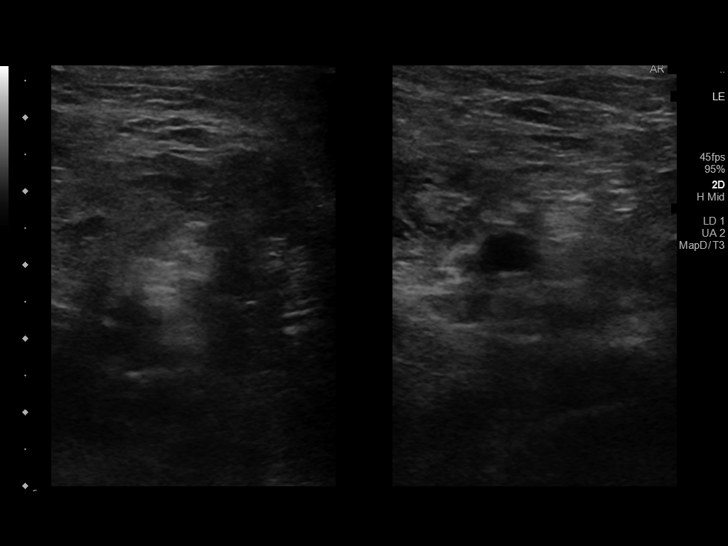
[im 28/38]
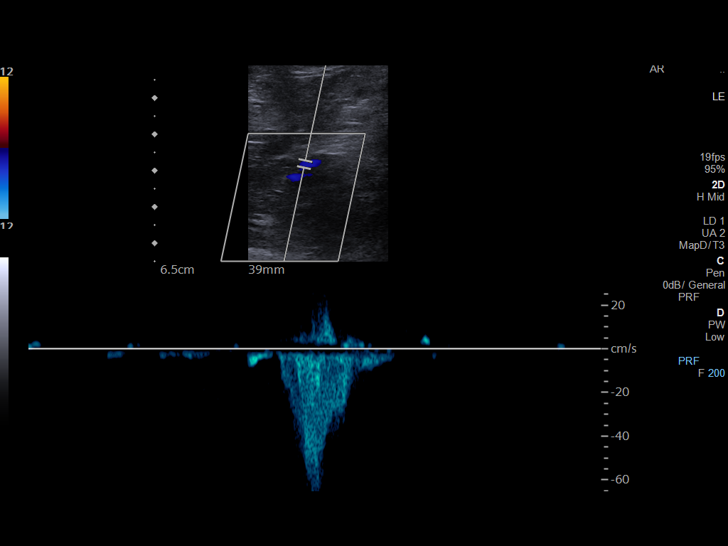
[im 31/38]
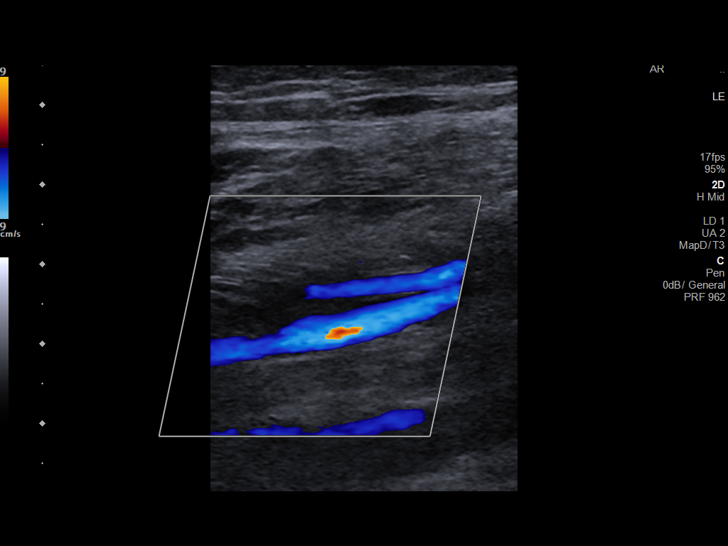
[im 34/38]
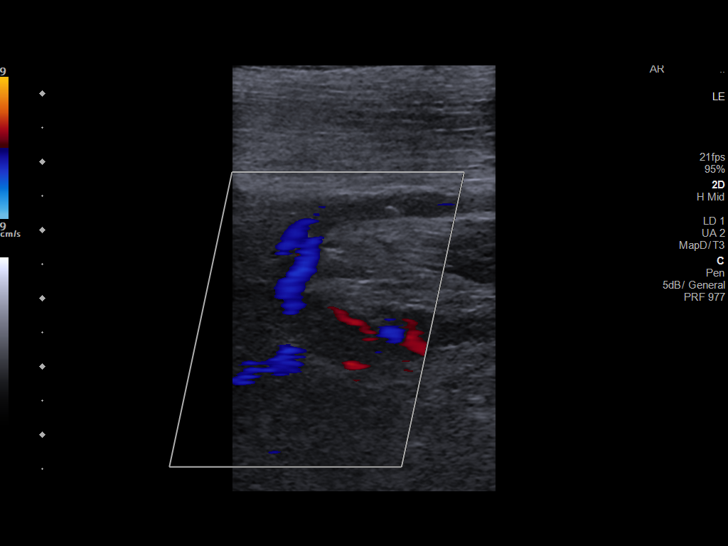
[im 38/38]
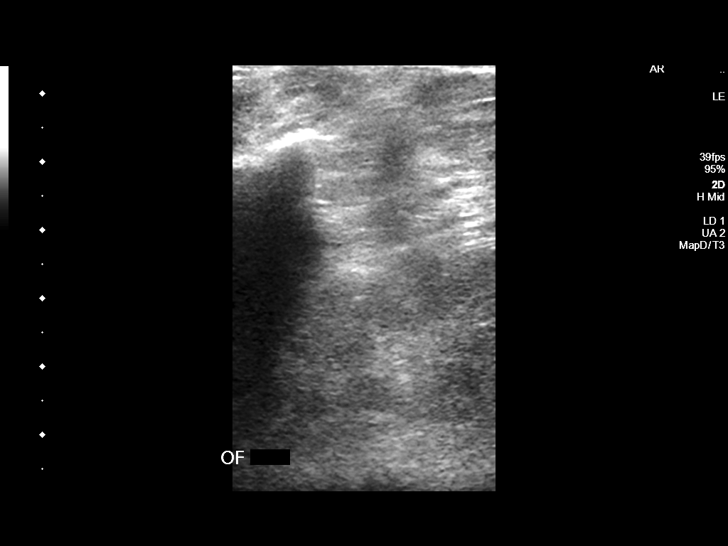

[13 of 24 positions shown; findings below may reference images not displayed]

FINDINGS: Contralateral Common Femoral Vein: Respiratory phasicity is normal
and symmetric with the symptomatic side. No evidence of thrombus.
Normal compressibility.

Common Femoral Vein: No evidence of thrombus. Normal
compressibility, respiratory phasicity and response to augmentation.

Saphenofemoral Junction: No evidence of thrombus. Normal
compressibility and flow on color Doppler imaging.

Profunda Femoral Vein: No evidence of thrombus. Normal
compressibility and flow on color Doppler imaging.

Femoral Vein: No evidence of thrombus. Normal compressibility,
respiratory phasicity and response to augmentation.

Popliteal Vein: No evidence of thrombus. Normal compressibility,
respiratory phasicity and response to augmentation.

Calf Veins: No evidence of thrombus. Normal compressibility and flow
on color Doppler imaging.

Superficial Great Saphenous Vein: No evidence of thrombus. Normal
compressibility.

Other Findings:  None.
IMPRESSION: No evidence of deep venous thrombosis.

## 2020-03-09 IMAGING — CR DG TIBIA/FIBULA 2V*L*
1 series · 6 of 6 positions shown · non-contrast
Comparison: None.

CLINICAL DATA: Leg pain

EXAM:
LEFT TIBIA AND FIBULA - 2 VIEW

[Series 1: lat · 0.17mm/px · 6 of 6 slices shown]
[im 1/6]
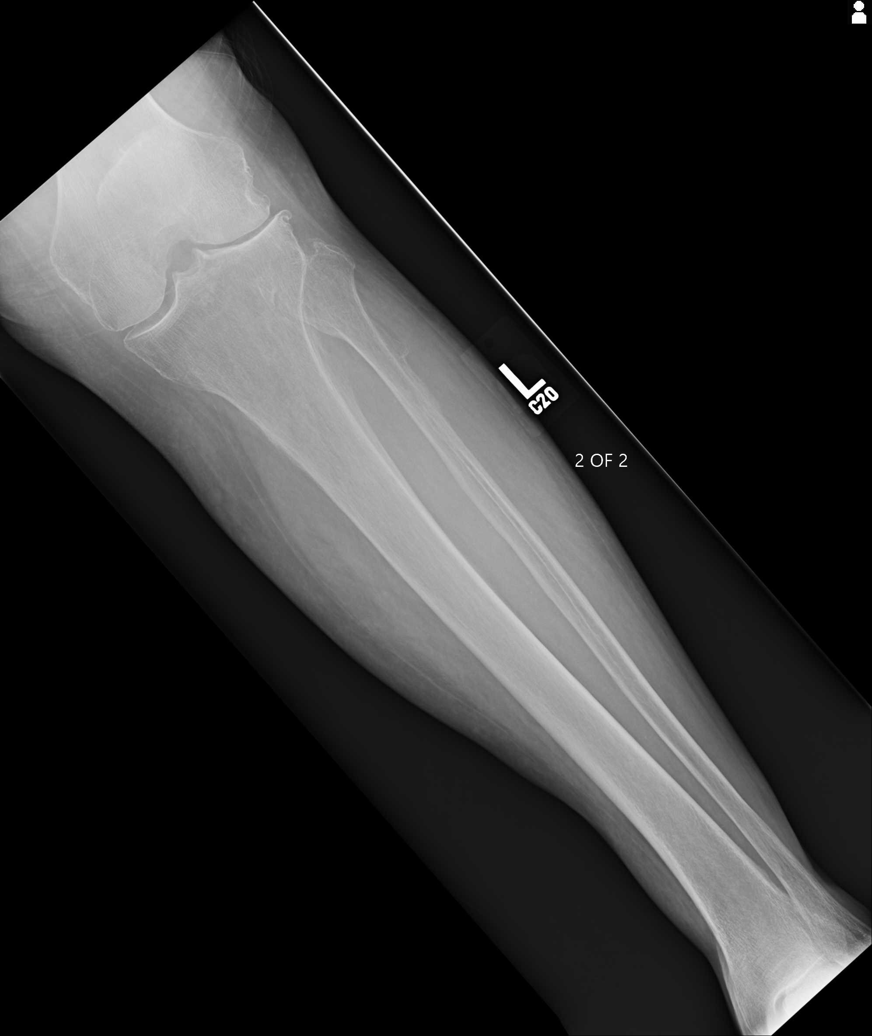
[im 2/6]
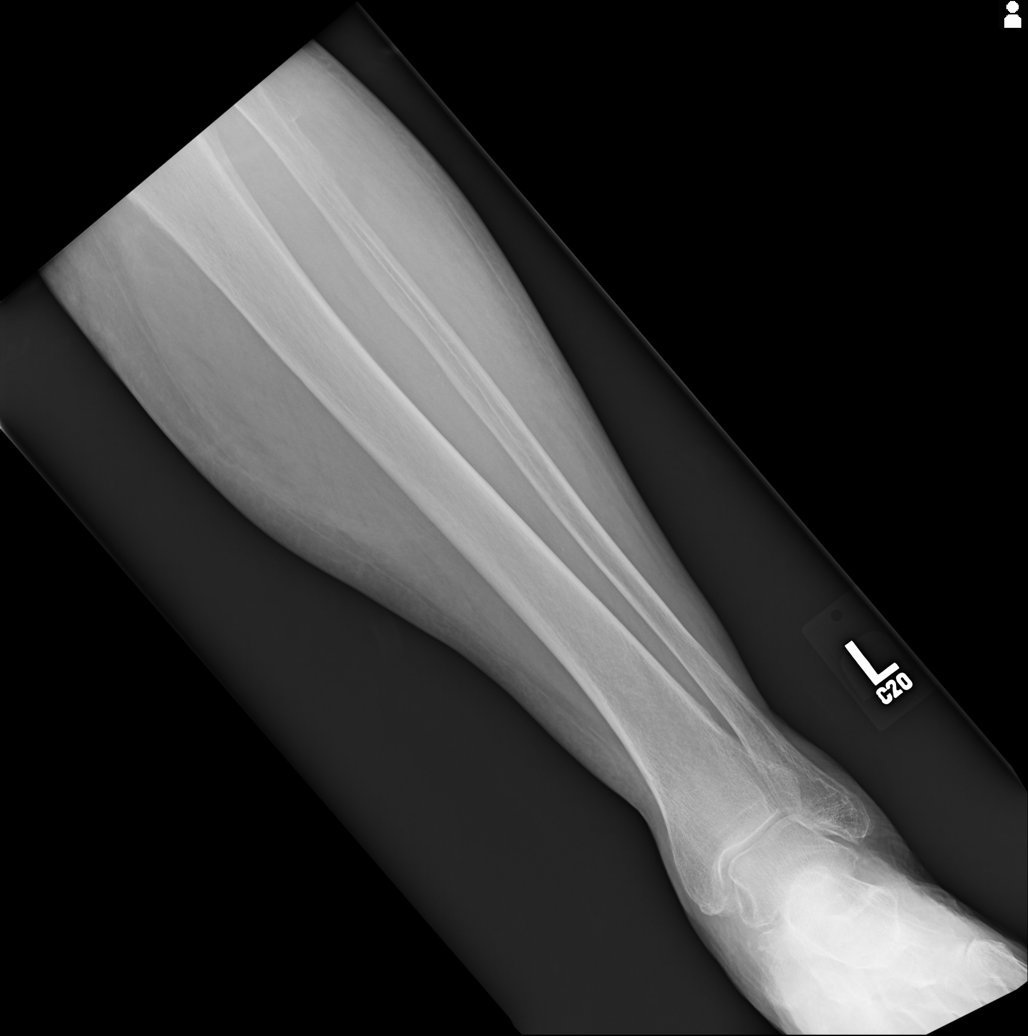
[im 3/6]
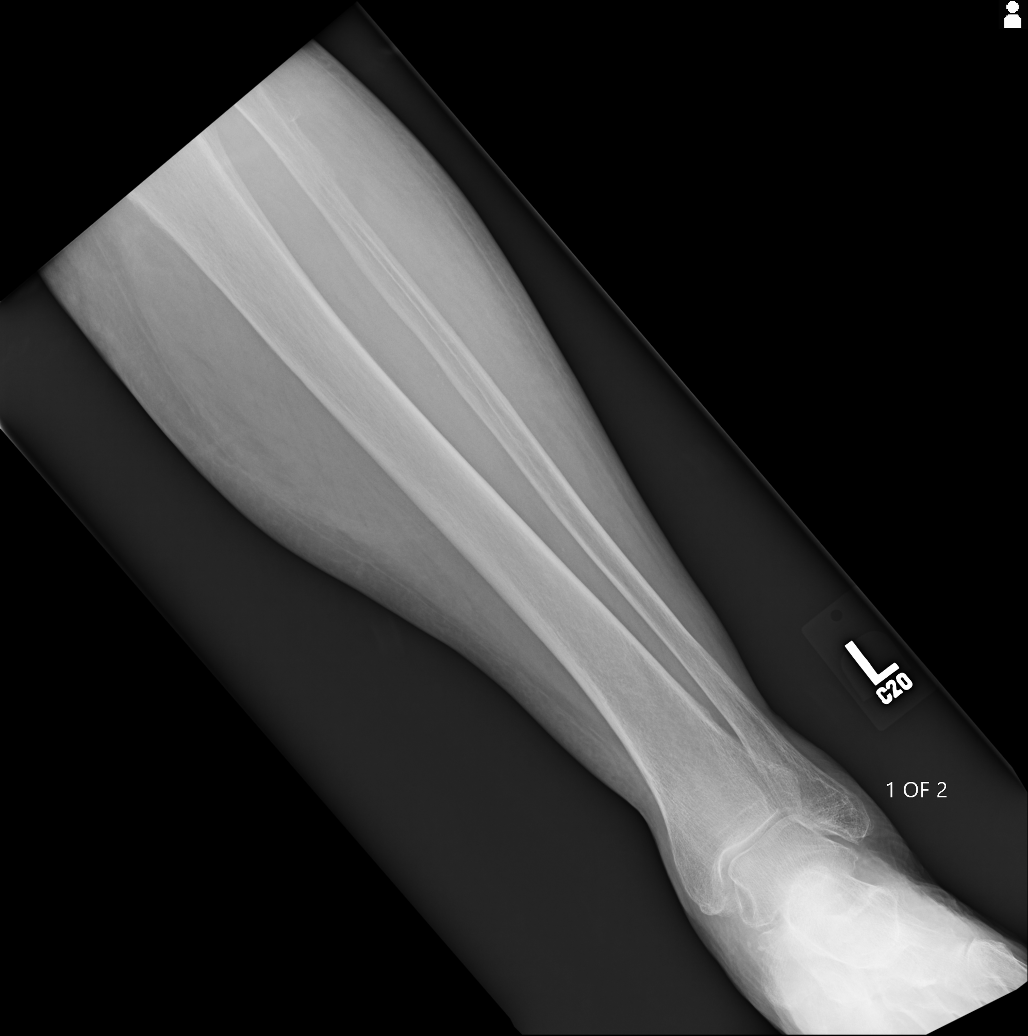
[im 4/6]
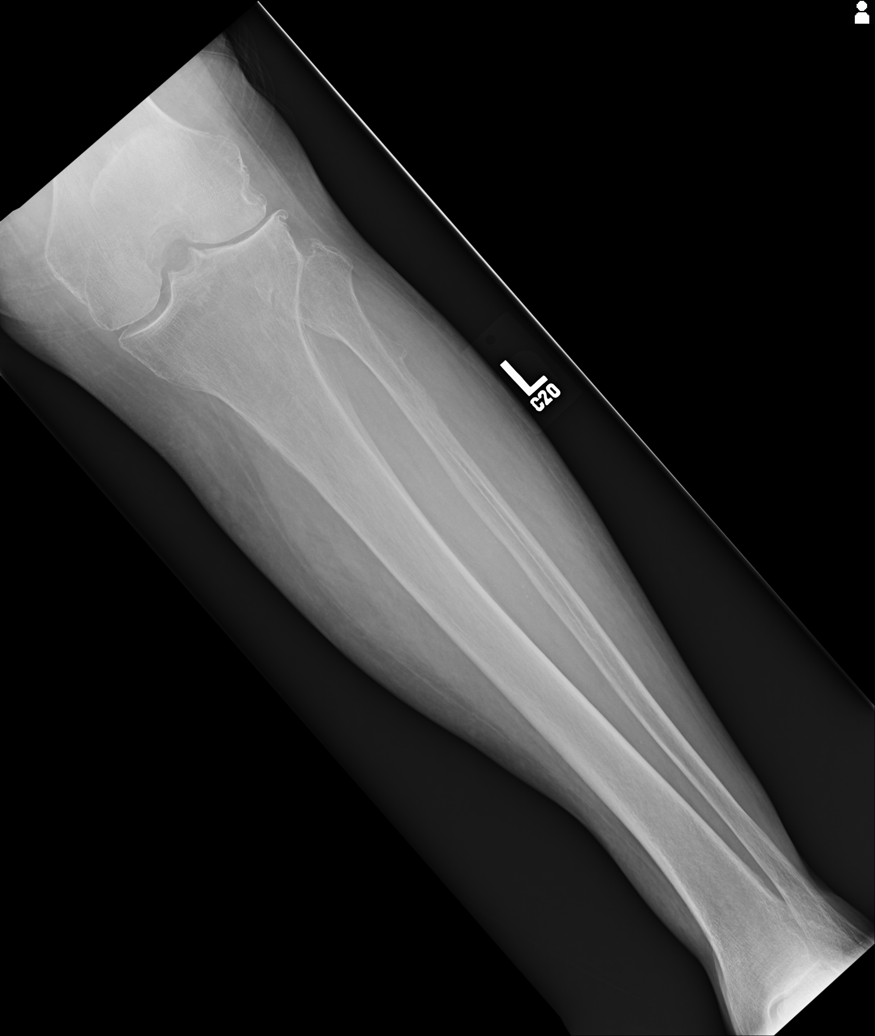
[im 5/6]
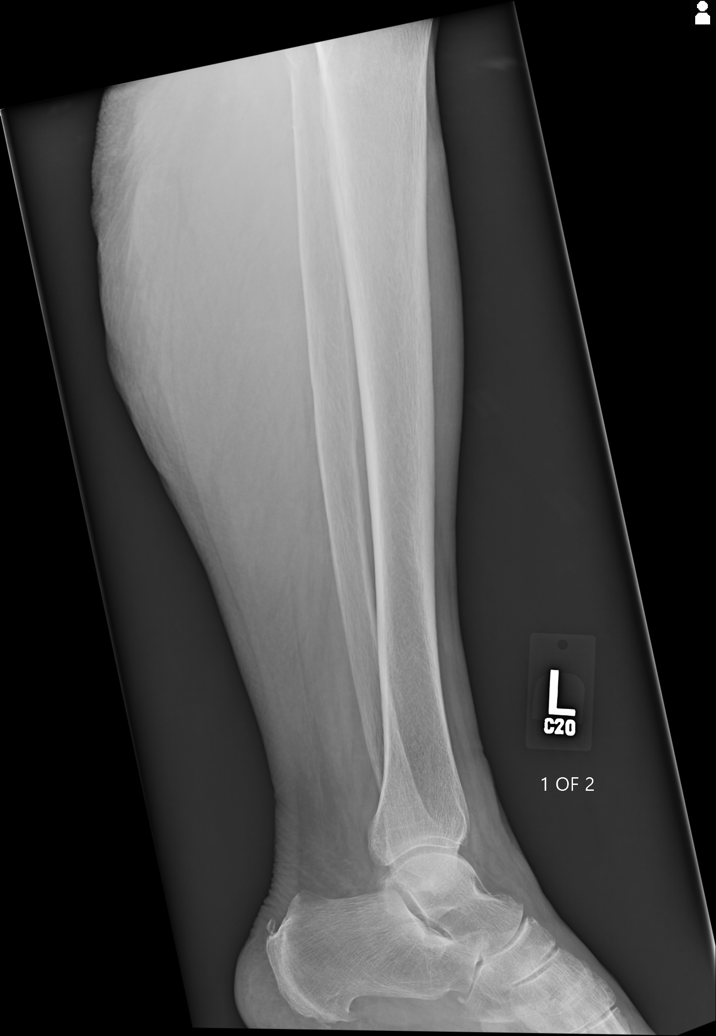
[im 6/6]
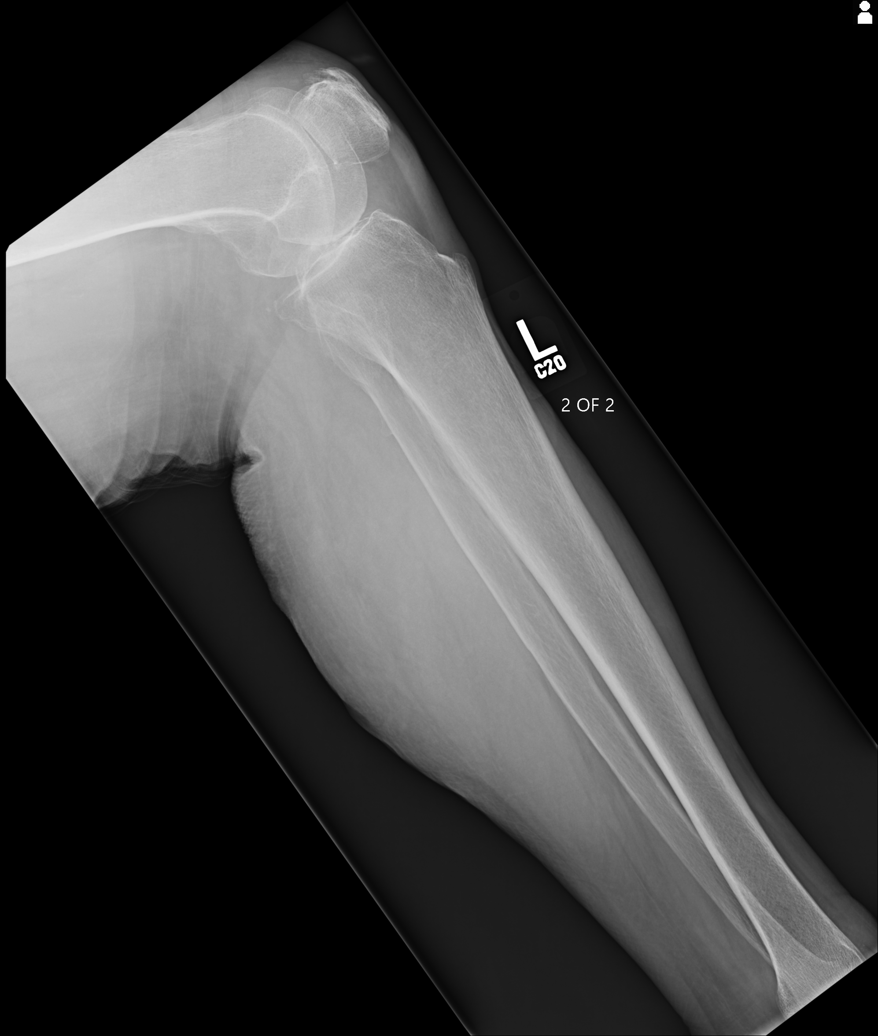

[6 of 6 positions shown; findings below may reference images not displayed]

FINDINGS: There is nonspecific soft tissue swelling about the lower extremity.
There is no displaced fracture. No dislocation. No radiopaque
foreign body. There are degenerative changes of the left knee,
similar to prior x-ray in 4241. There is a stable appearance of the
fibular head.
IMPRESSION: No acute osseous abnormality.

Nonspecific soft tissue swelling is noted involving the left lower
extremity.

## 2020-03-23 ENCOUNTER — Other Ambulatory Visit: Payer: Self-pay

## 2020-03-23 ENCOUNTER — Ambulatory Visit (INDEPENDENT_AMBULATORY_CARE_PROVIDER_SITE_OTHER): Payer: Medicare Other | Admitting: Student

## 2020-03-23 ENCOUNTER — Encounter: Payer: Self-pay | Admitting: Student

## 2020-03-23 VITALS — BP 132/72 | HR 69 | Ht 62.0 in | Wt 164.0 lb

## 2020-03-23 DIAGNOSIS — E785 Hyperlipidemia, unspecified: Secondary | ICD-10-CM | POA: Diagnosis not present

## 2020-03-23 DIAGNOSIS — I5042 Chronic combined systolic (congestive) and diastolic (congestive) heart failure: Secondary | ICD-10-CM

## 2020-03-23 DIAGNOSIS — I25118 Atherosclerotic heart disease of native coronary artery with other forms of angina pectoris: Secondary | ICD-10-CM | POA: Diagnosis not present

## 2020-03-23 DIAGNOSIS — I10 Essential (primary) hypertension: Secondary | ICD-10-CM

## 2020-03-23 DIAGNOSIS — I712 Thoracic aortic aneurysm, without rupture: Secondary | ICD-10-CM | POA: Diagnosis not present

## 2020-03-23 DIAGNOSIS — I7121 Aneurysm of the ascending aorta, without rupture: Secondary | ICD-10-CM

## 2020-03-23 MED ORDER — NITROGLYCERIN 0.4 MG SL SUBL: 0.4000 mg | SUBLINGUAL_TABLET | SUBLINGUAL | 3 refills | Status: DC | PRN

## 2020-03-23 NOTE — Patient Instructions (Signed)
Medication Instructions:  Your physician recommends that you continue on your current medications as directed. Please refer to the Current Medication list given to you today.  *If you need a refill on your cardiac medications before your next appointment, please call your pharmacy*   Lab Work: NONE   If you have labs (blood work) drawn today and your tests are completely normal, you will receive your results only by: Marland Kitchen MyChart Message (if you have MyChart) OR . A paper copy in the mail If you have any lab test that is abnormal or we need to change your treatment, we will call you to review the results.   Testing/Procedures: Your physician has requested that you regularly monitor and record your blood pressure readings at home. Please use the same machine at the same time of day to check your readings and record them to bring to your follow-up visit.  You have been given a blood pressure log. Please complete this for 2 weeks and return to the office.    Follow-Up: At [facility], you and your health needs are our priority.  As part of our continuing mission to provide you with exceptional heart care, we have created designated Provider Care Teams.  These Care Teams include your primary Cardiologist (physician) and Advanced Practice Providers (APPs -  Physician Assistants and Nurse Practitioners) who all work together to provide you with the care you need, when you need it.  We recommend signing up for the patient portal called "MyChart".  Sign up information is provided on this After Visit Summary.  MyChart is used to connect with patients for Virtual Visits (Telemedicine).  Patients are able to view lab/test results, encounter notes, upcoming appointments, etc.  Non-urgent messages can be sent to your provider as well.   To learn more about what you can do with MyChart, go to [email or web address].    Your next appointment:   1 year(s)  The format for your next appointment:   In  Person  Provider:   With MD    Other Instructions Thank you for choosing [facility]!

## 2020-03-23 NOTE — Progress Notes (Signed)
Cardiology Office Note    Date:  03/23/2020   ID:  [name], DOB 12-02-1935, MRN [id number]  PCP:  [name]., PA-C  Cardiologist: [name] Sable, MD (Inactive)    Chief Complaint  Patient presents with  . Follow-up    6 month visit    History of Present Illness:    [name] is a 84 y.o. female with past medical history of CAD (s/p DES to mid-LAD and DES to mid-OM1 in 75/6433), chronic systolic CHF/ICM (EF 29-51% in 2018, at 45% by repeat echo in 12/2018), HTN, HLD, RA, dilation of ascending aorta (at 4.2 cm by imaging in 2019) and COPD who presents to the office today for 64-month follow-up.  She most recently had a telehealth visit with Dr. Bronson [name] in 09/2019 and reported having chronic dyspnea on exertion which was overall stable but denied any chest pain or palpitations. She was continued on ASA 81mg  daily, Atorvastatin 80mg  daily, Entresto 24-26mg  BID and Coreg was titrated to 12.5mg  BID in the setting of her elevated BP and cardiomyopathy.   In talking with the patient today, she reports having dyspnea on exertion at baseline but denies any acute changes in this. No recent exertional chest pain, palpitations, orthopnea, or PND. Uses supplemental oxygen at night and as needed during the day. She does report issues with edema in the past but feels like this was secondary to her rheumatoid arthritis as symptoms resolved with changes in her medication regimen. She continues to live by herself but has help with the yard work and chores.    Past Medical History:  Diagnosis Date  . Anxiety   . CAD (coronary artery disease)    a. s/p DES to mid-LAD and DES to mid-OM1 in 11/2016  . Cervical disc disorder with myelopathy, unspecified cervical region   . Chronic systolic (congestive) heart failure ([place])   . COPD (chronic obstructive pulmonary disease) ([place])   . Coronary artery disease   . Disc disease with myelopathy, cervical   . Essential hypertension   .  Hemorrhoids   . Liver mass   . Lung, cysts, congenital    Left lung cyst  . Myocardial infarction ([place])   . Nephrolithiasis    Embedded  . Nonischemic cardiomyopathy ([place])    LVEF 35-40% 2015  . On home O2   . Osteoarthritis   . Thoracic ascending aortic aneurysm ([facility])    4.3 cm April 2016    Past Surgical History:  Procedure Laterality Date  . Benign breast tumors    . CHOLECYSTECTOMY    . COLONOSCOPY    . COLONOSCOPY N/A 09/22/2014   Procedure: COLONOSCOPY;  Surgeon: Rogene Houston, MD;  Location: [facility];  Service: Endoscopy;  Laterality: N/A;  830 -- to be done in OR under fluoro  . Complete hysterectomy    . CORONARY STENT INTERVENTION N/A 11/23/2016   Procedure: Coronary Stent Intervention;  Surgeon: Martinique, Peter M, MD;  Location: [facility];  Service: Cardiovascular;  Laterality: N/A;  . LEFT HEART CATH AND CORONARY ANGIOGRAPHY N/A 11/23/2016   Procedure: Left Heart Cath and Coronary Angiography;  Surgeon: Martinique, Peter M, MD;  Location: [facility];  Service: Cardiovascular;  Laterality: N/A;  . TONSILLECTOMY AND ADENOIDECTOMY      Current Medications: Outpatient Medications Prior to Visit  Medication Sig Dispense Refill  . albuterol (PROVENTIL) (2.5 MG/3ML) 0.083% nebulizer solution Take 3 mLs (2.5 mg total) by nebulization every 6 (six)  hours as needed for wheezing or shortness of breath. 75 mL 12  . aspirin EC 81 MG tablet Take 81 mg by mouth every morning.     Marland Kitchen atorvastatin (LIPITOR) 80 MG tablet TAKE 1 TABLET BY MOUTH ONCE DAILY AT BEDTIME. (Patient taking differently: Take 80 mg by mouth daily. ) 90 tablet 1  . carvedilol (COREG) 12.5 MG tablet Take 1 tablet (12.5 mg total) by mouth 2 (two) times daily. 180 tablet 3  . clorazepate (TRANXENE) 7.5 MG tablet Take 7.5 mg by mouth daily as needed for anxiety. For nerves    . ENTRESTO 24-26 MG TAKE 1 TABLET BY MOUTH TWICE DAILY. (Patient taking differently: Take 1 tablet by mouth 2 (two) times daily.  ) 60 tablet 3  . fluticasone (FLONASE) 50 MCG/ACT nasal spray Place 1 spray into both nostrils daily. 16 g 2  . Fluticasone-Umeclidin-Vilant (TRELEGY ELLIPTA) 200-62.5-25 MCG/INH AEPB Inhale 1 puff into the lungs daily. 60 each 0  . ibuprofen (ADVIL) 800 MG tablet Take 800 mg by mouth 3 (three) times daily.    Marland Kitchen levocetirizine (XYZAL) 5 MG tablet Take 1 tablet (5 mg total) by mouth every evening. 30 tablet 1  . meclizine (ANTIVERT) 25 MG tablet Take 25 mg by mouth as needed for dizziness.    . meloxicam (MOBIC) 7.5 MG tablet Take 7.5 mg by mouth daily.     . montelukast (SINGULAIR) 10 MG tablet Take 1 tablet (10 mg total) by mouth at bedtime. 30 tablet 5  . OXYGEN Inhale 2-2.5 L into the lungs at bedtime.     . traMADol (ULTRAM) 50 MG tablet Take 50 mg by mouth daily as needed for moderate pain or severe pain. Maximum dose= 8 tablets per day    . TRELEGY ELLIPTA 200-62.5-25 MCG/INH AEPB INHALE (1) PUFF BY MOUTH INTO THE LUNGS DAILY. (Patient taking differently: Take 1 puff by mouth. ) 60 each 5  . VENTOLIN HFA 108 (90 Base) MCG/ACT inhaler INHALE 2 PUFFS INTO THE LUNGS FOUR TIMES DAILY. 18 g PRN  . nitroGLYCERIN (NITROSTAT) 0.4 MG SL tablet Place 1 tablet (0.4 mg total) under the tongue every 5 (five) minutes x 3 doses as needed for chest pain. 25 tablet 3   No facility-administered medications prior to visit.     Allergies:   Plavix [clopidogrel bisulfate], Alprazolam, Codeine, Percodan [oxycodone-aspirin], and Valium   Social History   Socioeconomic History  . Marital status: Widowed    Spouse name: Not on file  . Number of children: Not on file  . Years of education: 9th  . Highest education level: Not on file  Occupational History    Employer: RETIRED  Tobacco Use  . Smoking status: Former Smoker    Packs/day: 0.25    Years: 3.00    Pack years: 0.75    Types: Cigarettes    Start date: 05/14/1977    Quit date: 05/28/2008    Years since quitting: 11.8  . Smokeless tobacco: Never  Used  . Tobacco comment: patient states she only smoked for 3 years total  Vaping Use  . Vaping Use: Never used  Substance and Sexual Activity  . Alcohol use: No    Alcohol/week: 0.0 standard drinks  . Drug use: No  . Sexual activity: Never  Other Topics Concern  . Not on file  Social History Narrative  . Not on file   Social Determinants of Health   Financial Resource Strain:   . Difficulty of Paying Living Expenses: Not  on file  Food Insecurity:   . Worried About Charity fundraiser in the Last Year: Not on file  . Ran Out of Food in the Last Year: Not on file  Transportation Needs:   . Lack of Transportation (Medical): Not on file  . Lack of Transportation (Non-Medical): Not on file  Physical Activity:   . Days of Exercise per Week: Not on file  . Minutes of Exercise per Session: Not on file  Stress:   . Feeling of Stress : Not on file  Social Connections:   . Frequency of Communication with Friends and Family: Not on file  . Frequency of Social Gatherings with Friends and Family: Not on file  . Attends Religious Services: Not on file  . Active Member of Clubs or Organizations: Not on file  . Attends Archivist Meetings: Not on file  . Marital Status: Not on file     Family History:  The patient's family history includes Aneurysm in her father; Diabetes in her brother; Heart disease in her brother, mother, and sister; Lung cancer in her brother.   Review of Systems:   Please see the history of present illness.     General:  No chills, fever, night sweats or weight changes.  Cardiovascular:  No chest pain, edema, orthopnea, palpitations, paroxysmal nocturnal dyspnea. Positive for dyspnea on exertion.  Dermatological: No rash, lesions/masses Respiratory: No cough, dyspnea Urologic: No hematuria, dysuria Abdominal:   No nausea, vomiting, diarrhea, bright red blood per rectum, melena, or hematemesis Neurologic:  No visual changes, wkns, changes in mental  status. All other systems reviewed and are otherwise negative except as noted above.   Physical Exam:    VS:  BP 132/72   Pulse 69   Ht 5\' 2"  (1.575 m)   Wt 164 lb (74.4 kg)   SpO2 97%   BMI 30.00 kg/m    General: Well developed, well nourished,female appearing in no acute distress. Head: Normocephalic, atraumatic. Neck: No carotid bruits. JVD not elevated.  Lungs: Respirations regular and unlabored, without wheezes or rales.  Heart: Regular rate and rhythm. No S3 or S4.  No murmur, no rubs, or gallops appreciated. Abdomen: Appears non-distended. No obvious abdominal masses. Msk:  Strength and tone appear normal for age. No obvious joint deformities or effusions. Extremities: No clubbing or cyanosis. No lower extremity edema.  Distal pedal pulses are 2+ bilaterally. Neuro: Alert and oriented X 3. Moves all extremities spontaneously. No focal deficits noted. Psych:  Responds to questions appropriately with a normal affect. Skin: No rashes or lesions noted  Wt Readings from Last 3 Encounters:  03/23/20 164 lb (74.4 kg)  02/23/20 171 lb (77.6 kg)  11/07/19 160 lb (72.6 kg)     Studies/Labs Reviewed:   EKG:  EKG is not ordered today. EKG from 12/10/2019 is reviewed and shows NSR, HR 68 with known LBBB.   Recent Labs: 11/07/2019: ALT 9; B Natriuretic Peptide 311.0; BUN 11; Creatinine, Ser 0.53; Hemoglobin 10.8; Platelets 376; Potassium 3.8; Sodium 139   Lipid Panel    Component Value Date/Time   CHOL 171 12/19/2018 0528   TRIG 53 12/19/2018 0528   HDL 79 12/19/2018 0528   CHOLHDL 2.2 12/19/2018 0528   VLDL 11 12/19/2018 0528   LDLCALC 81 12/19/2018 0528    Additional studies/ records that were reviewed today include:   Cardiac Catheterization: 96/2229  LV end diastolic pressure is normal.  Mid Cx lesion, 30 %stenosed.  Prox RCA lesion, 20 %  stenosed.  Prox LAD to Mid LAD lesion, 85 %stenosed.  A STENT PROMUS PREM MR 3.5X32 drug eluting stent was successfully  placed.  Post intervention, there is a 0% residual stenosis.  1st Mrg lesion, 90 %stenosed.  A STENT PROMUS PREM MR 2.5X16 drug eluting stent was successfully placed.  Post intervention, there is a 0% residual stenosis.   1. 2 vessel obstructive CAD    - 85% segmental mid LAD    - 90% mid OM1.  2. Normal LVEDP 3. Successful stenting of the mid LAD with DES 4. Successful stenting of the mid OM1 with DES  Plan: DAPT for one year. Anticipate DC in am.    Echocardiogram: 12/2018 IMPRESSIONS    1. The left ventricle has a visually estimated ejection fraction of 45%.  The cavity size was normal. There is moderate concentric left ventricular  hypertrophy. Left ventricular diastolic Doppler parameters are consistent  with impaired relaxation.  Elevated left ventricular end-diastolic pressure Left ventricular diffuse  hypokinesis.  2. The right ventricle has normal systolic function. The cavity was  normal. There is no increase in right ventricular wall thickness.  3. The mitral valve is grossly normal. Mild thickening of the mitral  valve leaflet.  4. The tricuspid valve is grossly normal.  5. The aortic valve was not well visualized. Aortic valve regurgitation  is trivial by color flow Doppler.  6. The aorta is normal in size and structure.  7. The inferior vena cava was dilated in size with >50% respiratory  variability.   Assessment:    1. Coronary artery disease of native artery of native heart with stable angina pectoris ([place])   2. Chronic combined systolic and diastolic CHF (congestive heart failure) ([place])   3. Essential hypertension   4. Hyperlipidemia LDL goal <70   5. Thoracic ascending aortic aneurysm ([place])      Plan:   In order of problems listed above:  1. CAD - She is s/p DES to mid-LAD and DES to mid-OM1 in 11/2016. She has dyspnea on exertion at baseline but denies any acute changes in this. Says it has actually improved following medication  adjustments by Pulmonology. No recent chest pain.  - Continue ASA 81mg  daily, Atorvastatin 80mg  daily and Coreg 12.5mg  BID. Updated Rx for SL NTG provided.   2. Chronic Combined Systolic and Diastolic CHF - Her EF had improved to 45% by most repeat echo in 12/2018. She appears euvolemic on examination today. Continue Coreg 12.5mg  BID and Entresto 24-26mg  BID. Was encouraged to continue to follow readings at home and if BP allows would consider further titration of Entresto or the addition of Spironolactone in the setting of her cardiomyopathy.   3. HTN - BP is well-controlled at 132/72 during today's visit and has been well-controlled when checked at home. Continue current medication regimen with Coreg and Entresto.   4. HLD - LDL was at 81 in 12/2018 but she had been off statin therapy intermittently by review of notes. Will request a copy of most recent labs from her PCP. She remains on Atorvastatin 80mg  daily with goal LDL less than 70 with known CAD.   5. Thoracic Aortic Aneurysm - Was improved to 4.2 cm by imaging in 2019. Would anticipate obtaining repeat imaging next year if not obtained by her PCP in the interim.   Medication Adjustments/Labs and Tests Ordered: Current medicines are reviewed at length with the patient today.  Concerns regarding medicines are outlined above.  Medication changes, Labs and  Tests ordered today are listed in the Patient Instructions below. Patient Instructions  Medication Instructions:  Your physician recommends that you continue on your current medications as directed. Please refer to the Current Medication list given to you today.  *If you need a refill on your cardiac medications before your next appointment, please call your pharmacy*   Lab Work: NONE   If you have labs (blood work) drawn today and your tests are completely normal, you will receive your results only by: Marland Kitchen MyChart Message (if you have MyChart) OR . A paper copy in the mail If you  have any lab test that is abnormal or we need to change your treatment, we will call you to review the results.   Testing/Procedures: Your physician has requested that you regularly monitor and record your blood pressure readings at home. Please use the same machine at the same time of day to check your readings and record them to bring to your follow-up visit.  You have been given a blood pressure log. Please complete this for 2 weeks and return to the office.    Follow-Up: At [facility], you and your health needs are our priority.  As part of our continuing mission to provide you with exceptional heart care, we have created designated Provider Care Teams.  These Care Teams include your primary Cardiologist (physician) and Advanced Practice Providers (APPs -  Physician Assistants and Nurse Practitioners) who all work together to provide you with the care you need, when you need it.  We recommend signing up for the patient portal called "MyChart".  Sign up information is provided on this After Visit Summary.  MyChart is used to connect with patients for Virtual Visits (Telemedicine).  Patients are able to view lab/test results, encounter notes, upcoming appointments, etc.  Non-urgent messages can be sent to your provider as well.   To learn more about what you can do with MyChart, go to [email or web address].    Your next appointment:   1 year(s)  The format for your next appointment:   In Person  Provider:   With MD    Other Instructions Thank you for choosing [facility]!       Signed, Erma Heritage, PA-C  03/23/2020 7:53 PM    [facility] [address] [place], [place] [address] Phone: [phone] Fax: [phone]

## 2020-03-28 ENCOUNTER — Telehealth: Payer: Self-pay

## 2020-03-28 ENCOUNTER — Other Ambulatory Visit: Payer: Self-pay

## 2020-03-28 ENCOUNTER — Encounter: Payer: Self-pay | Admitting: Nurse Practitioner

## 2020-03-28 ENCOUNTER — Ambulatory Visit (INDEPENDENT_AMBULATORY_CARE_PROVIDER_SITE_OTHER): Payer: Medicare Other | Admitting: Nurse Practitioner

## 2020-03-28 VITALS — BP 110/54 | HR 68 | Temp 98.5°F | Resp 18 | Ht 62.0 in | Wt 162.0 lb

## 2020-03-28 DIAGNOSIS — J449 Chronic obstructive pulmonary disease, unspecified: Secondary | ICD-10-CM

## 2020-03-28 DIAGNOSIS — F419 Anxiety disorder, unspecified: Secondary | ICD-10-CM

## 2020-03-28 DIAGNOSIS — R7301 Impaired fasting glucose: Secondary | ICD-10-CM

## 2020-03-28 DIAGNOSIS — I2510 Atherosclerotic heart disease of native coronary artery without angina pectoris: Secondary | ICD-10-CM

## 2020-03-28 DIAGNOSIS — R32 Unspecified urinary incontinence: Secondary | ICD-10-CM

## 2020-03-28 LAB — GLUCOSE, POCT (MANUAL RESULT ENTRY): POC Glucose: 166 mg/dl — AB (ref 70–99)

## 2020-03-28 MED ORDER — CLORAZEPATE DIPOTASSIUM 3.75 MG PO TABS: 7.5000 mg | ORAL_TABLET | Freq: Every day | ORAL | Status: AC | PRN

## 2020-03-28 MED ORDER — CLORAZEPATE DIPOTASSIUM 7.5 MG PO TABS: 7.5000 mg | ORAL_TABLET | Freq: Every day | ORAL | 0 refills | Status: DC | PRN

## 2020-03-28 NOTE — Addendum Note (Signed)
Addended by: Lonn [place] on: 03/28/2020 11:53 AM   Modules accepted: Orders

## 2020-03-28 NOTE — Telephone Encounter (Signed)
Clinic-Administered Medications clorazepate (TRANXENE) tablet 7.5 mg   Please send this in to [place]

## 2020-03-28 NOTE — Patient Instructions (Addendum)
Anxiety -requesting refill on clorazepate 7.5 mg PO BID PRN -has been taking this for many years; originally prescribed by Dr. Luan Pulling -PMP Aware database shows last fill 03/24/2019 -refilled for daily use PRN; we discussed risks benefits  Impaired Fasting Glucose -was 151 previously, unsure if this was fasting -today, CBG was in the 160s, non-fasting -will check A1c with next set of labs  ??? Dementia -states that [facility] clinic tried to take her driver's license -will check MMSE at next visit  Chronic Pain -to left shoulder, had surgery previously -takes meloxicam -takes tramadol; last fill 03/15/20; will be due 04/14/20 -will refill when due -she will notify us when she needs a refill  Urinary Incontinence -wears pull-ups -will consider sending in incontinence supplies as DME order if she needs these in the future

## 2020-03-28 NOTE — Progress Notes (Signed)
New Patient Office Visit  Subjective:  Patient ID: [name], female    DOB: [birth year]  Age: 84 y.o. MRN: [id number]  CC: Establish Care Chief Complaint  Patient presents with  . Anxiety    HPI [name] presents to establish care.  Transferring care from [facility] clinic.  She states that she had her labs drawn about a month ago, but is unsure of the dates. Will get med records.  She is poor historian, especially related to dates.  Past Medical History:  Diagnosis Date  . Anxiety   . CAD (coronary artery disease)    a. s/p DES to mid-LAD and DES to mid-OM1 in 11/2016  . Cervical disc disorder with myelopathy, unspecified cervical region   . Chronic systolic (congestive) heart failure ([place])   . COPD (chronic obstructive pulmonary disease) ([place])   . Coronary artery disease   . Disc disease with myelopathy, cervical   . Essential hypertension   . Hemorrhoids   . Liver mass   . Lung, cysts, congenital    Left lung cyst  . Myocardial infarction ([place])   . Nephrolithiasis    Embedded  . Nonischemic cardiomyopathy ([place])    LVEF 35-40% 2015  . On home O2   . Osteoarthritis   . Thoracic ascending aortic aneurysm ([facility])    4.3 cm April 2016    Past Surgical History:  Procedure Laterality Date  . Benign breast tumors    . CHOLECYSTECTOMY    . COLONOSCOPY    . COLONOSCOPY N/A 09/22/2014   Procedure: COLONOSCOPY;  Surgeon: Rogene Houston, MD;  Location: [facility];  Service: Endoscopy;  Laterality: N/A;  830 -- to be done in OR under fluoro  . Complete hysterectomy    . CORONARY STENT INTERVENTION N/A 11/23/2016   Procedure: Coronary Stent Intervention;  Surgeon: Martinique, Peter M, MD;  Location: [place] [facility];  Service: Cardiovascular;  Laterality: N/A;  . LEFT HEART CATH AND CORONARY ANGIOGRAPHY N/A 11/23/2016   Procedure: Left Heart Cath and Coronary Angiography;  Surgeon: Martinique, Peter M, MD;  Location: [place] [facility];  Service: Cardiovascular;  Laterality:  N/A;  . TONSILLECTOMY AND ADENOIDECTOMY      Family History  Problem Relation Age of Onset  . Heart disease Mother   . Aneurysm Father   . Lung cancer Brother   . Heart disease Sister   . Diabetes Brother   . Heart disease Brother     Social History   Socioeconomic History  . Marital status: Widowed    Spouse name: Not on file  . Number of children: Not on file  . Years of education: 9th  . Highest education level: Not on file  Occupational History    Employer: RETIRED  Tobacco Use  . Smoking status: Former Smoker    Packs/day: 0.25    Years: 3.00    Pack years: 0.75    Types: Cigarettes    Start date: 05/14/1977    Quit date: 05/28/2008    Years since quitting: 11.8  . Smokeless tobacco: Never Used  . Tobacco comment: patient states she only smoked for 3 years total  Vaping Use  . Vaping Use: Never used  Substance and Sexual Activity  . Alcohol use: No    Alcohol/week: 0.0 standard drinks  . Drug use: No  . Sexual activity: Never  Other Topics Concern  . Not on file  Social History Narrative  . Not on file  Social Determinants of Health   Financial Resource Strain:   . Difficulty of Paying Living Expenses: Not on file  Food Insecurity:   . Worried About Charity fundraiser in the Last Year: Not on file  . Ran Out of Food in the Last Year: Not on file  Transportation Needs:   . Lack of Transportation (Medical): Not on file  . Lack of Transportation (Non-Medical): Not on file  Physical Activity:   . Days of Exercise per Week: Not on file  . Minutes of Exercise per Session: Not on file  Stress:   . Feeling of Stress : Not on file  Social Connections:   . Frequency of Communication with Friends and Family: Not on file  . Frequency of Social Gatherings with Friends and Family: Not on file  . Attends Religious Services: Not on file  . Active Member of Clubs or Organizations: Not on file  . Attends Archivist Meetings: Not on file  . Marital  Status: Not on file  Intimate Partner Violence:   . Fear of Current or Ex-Partner: Not on file  . Emotionally Abused: Not on file  . Physically Abused: Not on file  . Sexually Abused: Not on file    ROS Review of Systems  Respiratory: Negative for chest tightness, shortness of breath and wheezing.   Cardiovascular: Negative for chest pain, palpitations and leg swelling.  Musculoskeletal: Positive for arthralgias.       Chronic left shoulder pain  Neurological:       Forgetful at times; has trouble with date recall    Objective:   Today's Vitals: BP (!) 110/54   Pulse 68   Temp 98.5 F (36.9 C)   Resp 18   Ht 5\' 2"  (1.575 m)   Wt 162 lb (73.5 kg)   SpO2 95%   BMI 29.63 kg/m   Physical Exam Cardiovascular:     Rate and Rhythm: Normal rate and regular rhythm.  Pulmonary:     Effort: Pulmonary effort is normal.     Breath sounds: Normal breath sounds.  Musculoskeletal:     Comments: Uses cane for ambulation  Neurological:     General: No focal deficit present.     Mental Status: She is oriented to person, place, and time.     Assessment & Plan:   Problem List Items Addressed This Visit      Cardiovascular and Mediastinum   CAD (coronary artery disease)     Respiratory   COPD (chronic obstructive pulmonary disease) ([place]) - Primary     Other   Anxiety    Other Visit Diagnoses    Vertigo, benign paroxysmal, unspecified laterality       Impaired fasting glucose       Urinary incontinence, unspecified type          Outpatient Encounter Medications as of 03/28/2020  Medication Sig  . albuterol (PROVENTIL) (2.5 MG/3ML) 0.083% nebulizer solution Take 3 mLs (2.5 mg total) by nebulization every 6 (six) hours as needed for wheezing or shortness of breath.  Marland Kitchen aspirin EC 81 MG tablet Take 81 mg by mouth every morning.   Marland Kitchen atorvastatin (LIPITOR) 80 MG tablet TAKE 1 TABLET BY MOUTH ONCE DAILY AT BEDTIME. (Patient taking differently: Take 80 mg by mouth daily. )  .  clorazepate (TRANXENE) 7.5 MG tablet Take 7.5 mg by mouth daily as needed for anxiety. For nerves  . ENTRESTO 24-26 MG TAKE 1 TABLET BY MOUTH TWICE DAILY. (Patient  taking differently: Take 1 tablet by mouth 2 (two) times daily. )  . fluticasone (FLONASE) 50 MCG/ACT nasal spray Place 1 spray into both nostrils daily.  . Fluticasone-Umeclidin-Vilant (TRELEGY ELLIPTA) 200-62.5-25 MCG/INH AEPB Inhale 1 puff into the lungs daily.  Marland Kitchen ibuprofen (ADVIL) 800 MG tablet Take 800 mg by mouth 3 (three) times daily.  Marland Kitchen levocetirizine (XYZAL) 5 MG tablet Take 1 tablet (5 mg total) by mouth every evening.  . meclizine (ANTIVERT) 25 MG tablet Take 25 mg by mouth as needed for dizziness.  . meloxicam (MOBIC) 7.5 MG tablet Take 7.5 mg by mouth daily.   . montelukast (SINGULAIR) 10 MG tablet Take 1 tablet (10 mg total) by mouth at bedtime.  . nitroGLYCERIN (NITROSTAT) 0.4 MG SL tablet Place 1 tablet (0.4 mg total) under the tongue every 5 (five) minutes x 3 doses as needed for chest pain.  . OXYGEN Inhale 2-2.5 L into the lungs at bedtime.   . traMADol (ULTRAM) 50 MG tablet Take 50 mg by mouth daily as needed for moderate pain or severe pain. Maximum dose= 8 tablets per day  . TRELEGY ELLIPTA 200-62.5-25 MCG/INH AEPB INHALE (1) PUFF BY MOUTH INTO THE LUNGS DAILY. (Patient taking differently: Take 1 puff by mouth. )  . VENTOLIN HFA 108 (90 Base) MCG/ACT inhaler INHALE 2 PUFFS INTO THE LUNGS FOUR TIMES DAILY.  . carvedilol (COREG) 12.5 MG tablet Take 1 tablet (12.5 mg total) by mouth 2 (two) times daily.   No facility-administered encounter medications on file as of 03/28/2020.    Anxiety -requesting refill on clorazepate 7.5 mg PO BID PRN -has been taking this for many years; originally prescribed by Dr. Luan Pulling -PMP Aware database shows last fill 03/24/2019 -refilled for daily use PRN; we discussed risks benefits  Impaired Fasting Glucose -was 151 previously, unsure if this was fasting -today, CBG was in the  160s, non-fasting -will check A1c with next set of labs  ??? Dementia -states that [facility] tried to take her driver's license -will check MMSE at next visit  Chronic Pain -to left shoulder, had surgery previously -takes meloxicam -takes tramadol; last fill 03/15/20; will be due 04/14/20 -will refill when due -she will notify us when she needs a refill  Urinary Incontinence -wears pull-ups -will consider sending in incontinence supplies as DME order if she needs these in the future    Follow-up: Return in about 1 month (around 04/27/2020) for medication check.   [name], NP

## 2020-03-30 ENCOUNTER — Other Ambulatory Visit: Payer: Self-pay | Admitting: Cardiology

## 2020-04-20 ENCOUNTER — Other Ambulatory Visit: Payer: Self-pay

## 2020-04-20 ENCOUNTER — Emergency Department (HOSPITAL_COMMUNITY)
Admission: EM | Admit: 2020-04-20 | Discharge: 2020-04-20 | Disposition: A | Discharge status: Home / Self Care | Payer: Medicare Other | Attending: Emergency Medicine | Admitting: Emergency Medicine

## 2020-04-20 ENCOUNTER — Emergency Department (HOSPITAL_COMMUNITY): Payer: Medicare Other

## 2020-04-20 DIAGNOSIS — I2510 Atherosclerotic heart disease of native coronary artery without angina pectoris: Secondary | ICD-10-CM | POA: Diagnosis not present

## 2020-04-20 DIAGNOSIS — I10 Essential (primary) hypertension: Secondary | ICD-10-CM | POA: Diagnosis not present

## 2020-04-20 DIAGNOSIS — N390 Urinary tract infection, site not specified: Secondary | ICD-10-CM | POA: Diagnosis not present

## 2020-04-20 DIAGNOSIS — R0602 Shortness of breath: Secondary | ICD-10-CM | POA: Diagnosis not present

## 2020-04-20 DIAGNOSIS — Z7982 Long term (current) use of aspirin: Secondary | ICD-10-CM | POA: Diagnosis not present

## 2020-04-20 DIAGNOSIS — I5042 Chronic combined systolic (congestive) and diastolic (congestive) heart failure: Secondary | ICD-10-CM | POA: Diagnosis not present

## 2020-04-20 DIAGNOSIS — R509 Fever, unspecified: Secondary | ICD-10-CM | POA: Diagnosis not present

## 2020-04-20 DIAGNOSIS — Z87891 Personal history of nicotine dependence: Secondary | ICD-10-CM | POA: Diagnosis not present

## 2020-04-20 DIAGNOSIS — R52 Pain, unspecified: Secondary | ICD-10-CM | POA: Diagnosis not present

## 2020-04-20 DIAGNOSIS — R059 Cough, unspecified: Secondary | ICD-10-CM | POA: Diagnosis not present

## 2020-04-20 DIAGNOSIS — M79662 Pain in left lower leg: Secondary | ICD-10-CM | POA: Diagnosis not present

## 2020-04-20 DIAGNOSIS — I110 Hypertensive heart disease with heart failure: Secondary | ICD-10-CM | POA: Insufficient documentation

## 2020-04-20 DIAGNOSIS — J449 Chronic obstructive pulmonary disease, unspecified: Secondary | ICD-10-CM | POA: Diagnosis not present

## 2020-04-20 DIAGNOSIS — R110 Nausea: Secondary | ICD-10-CM | POA: Diagnosis not present

## 2020-04-20 DIAGNOSIS — R197 Diarrhea, unspecified: Secondary | ICD-10-CM | POA: Diagnosis not present

## 2020-04-20 DIAGNOSIS — I517 Cardiomegaly: Secondary | ICD-10-CM | POA: Diagnosis not present

## 2020-04-20 DIAGNOSIS — M79605 Pain in left leg: Secondary | ICD-10-CM | POA: Diagnosis not present

## 2020-04-20 LAB — LACTIC ACID, PLASMA: Lactic Acid, Venous: 1.2 mmol/L (ref 0.5–1.9)

## 2020-04-20 LAB — COMPREHENSIVE METABOLIC PANEL
ALT: 17 U/L (ref 0–44)
AST: 24 U/L (ref 15–41)
Albumin: 3.6 g/dL (ref 3.5–5.0)
Alkaline Phosphatase: 64 U/L (ref 38–126)
Anion gap: 8 (ref 5–15)
BUN: 11 mg/dL (ref 8–23)
CO2: 25 mmol/L (ref 22–32)
Calcium: 8.7 mg/dL — ABNORMAL LOW (ref 8.9–10.3)
Chloride: 105 mmol/L (ref 98–111)
Creatinine, Ser: 0.65 mg/dL (ref 0.44–1.00)
GFR, Estimated: >60 mL/min (ref >60)
Glucose, Bld: 121 mg/dL — ABNORMAL HIGH (ref 70–99)
Potassium: 3.4 mmol/L — ABNORMAL LOW (ref 3.5–5.1)
Sodium: 138 mmol/L (ref 135–145)
Total Bilirubin: 0.3 mg/dL (ref 0.3–1.2)
Total Protein: 6.2 g/dL — ABNORMAL LOW (ref 6.5–8.1)

## 2020-04-20 LAB — URINALYSIS, ROUTINE W REFLEX MICROSCOPIC
Bilirubin Urine: NEGATIVE
Glucose, UA: NEGATIVE mg/dL
Ketones, ur: NEGATIVE mg/dL
Nitrite: POSITIVE — AB
Protein, ur: NEGATIVE mg/dL
Specific Gravity, Urine: 1.005 (ref 1.005–1.030)
pH: 6 (ref 5.0–8.0)

## 2020-04-20 LAB — CBC WITH DIFFERENTIAL/PLATELET
Abs Immature Granulocytes: 0 10*3/uL (ref 0.00–0.07)
Basophils Absolute: 0 10*3/uL (ref 0.0–0.1)
Basophils Relative: 1 %
Eosinophils Absolute: 0 10*3/uL (ref 0.0–0.5)
Eosinophils Relative: 1 %
HCT: 35.2 % — ABNORMAL LOW (ref 36.0–46.0)
Hemoglobin: 10.7 g/dL — ABNORMAL LOW (ref 12.0–15.0)
Immature Granulocytes: 0 %
Lymphocytes Relative: 20 %
Lymphs Abs: 0.7 10*3/uL (ref 0.7–4.0)
MCH: 26.1 pg (ref 26.0–34.0)
MCHC: 30.4 g/dL (ref 30.0–36.0)
MCV: 85.9 fL (ref 80.0–100.0)
Monocytes Absolute: 0.8 10*3/uL (ref 0.1–1.0)
Monocytes Relative: 25 %
Neutro Abs: 1.7 10*3/uL (ref 1.7–7.7)
Neutrophils Relative %: 53 %
Platelets: 223 10*3/uL (ref 150–400)
RBC: 4.1 MIL/uL (ref 3.87–5.11)
RDW: 16 % — ABNORMAL HIGH (ref 11.5–15.5)
WBC: 3.3 10*3/uL — ABNORMAL LOW (ref 4.0–10.5)
nRBC: 0 % (ref 0.0–0.2)

## 2020-04-20 MED ORDER — ONDANSETRON 4 MG PO TBDP: 4.0000 mg | ORAL_TABLET | Freq: Three times a day (TID) | ORAL | 0 refills | Status: DC | PRN

## 2020-04-20 MED ORDER — SODIUM CHLORIDE 0.9 % IV SOLN: Freq: Once | INTRAVENOUS | Status: AC

## 2020-04-20 MED ORDER — SODIUM CHLORIDE 0.9 % IV SOLN
1.0000 g | Freq: Once | INTRAVENOUS | Status: AC
  Administered 2020-04-20: 1 g via INTRAVENOUS
  Filled 2020-04-20: qty 10

## 2020-04-20 MED ORDER — CEPHALEXIN 500 MG PO CAPS: 500.0000 mg | ORAL_CAPSULE | Freq: Two times a day (BID) | ORAL | 0 refills | Status: AC

## 2020-04-20 MED ORDER — ONDANSETRON HCL 4 MG/2ML IJ SOLN
4.0000 mg | Freq: Once | INTRAMUSCULAR | Status: AC
  Administered 2020-04-20: 4 mg via INTRAVENOUS
  Filled 2020-04-20: qty 2

## 2020-04-20 NOTE — Discharge Instructions (Signed)
Your testi today shows that you likely have a urinary tract infection such as your bladder ng.  Your blood work was otherwise unremarkable and normal.  You do have some mild anemia but this has not changed over time.  At this time I would like to treat you with an antibiotic called cephalexin, take this twice a daily for the next 7 days.  Unfortunately this may cause worsening of the diarrhea but will treat the urine infection.  You may use Imodium for the diarrhea if you have diarrhea lasting longer than 48 hours.  I have also prescribed ondansetron which is a nausea medication you can take every 6 hours as needed for nausea.  Please drink plenty of clear liquids see your doctor within 3 days for recheck if no better but come back to the ER for worsening symptoms.

## 2020-04-20 NOTE — ED Triage Notes (Signed)
Presents for complaints of fever of 100.4 f, diarrhea, nausea. Was concerned over fever so called EMS for eval. Symptoms began last night. EMS gave zofran. Reports did not take apap or motrin due to being so sick on her stomach.

## 2020-04-20 NOTE — ED Provider Notes (Signed)
[facility] EMERGENCY DEPARTMENT Provider Note   CSN: [id number] Arrival date & time: 04/20/20  0940     History Chief Complaint  Patient presents with  . Fever    [name] is a 84 y.o. female.  HPI   This patient is an 84 year old female with a known history of coronary disease status post stenting, she has known COPD stage IV, she has hemorrhoids, hypertension and and nonischemic cardiomyopathy with an ejection fraction of 35 to 40%.  She uses oxygen at night.  She notes that over the last couple of days she has had a temperature ranging from 100.3-100.4, she has also had some diarrhea that started today with some nausea.  She has no coughing or shortness of breath, no chest pain or myalgias, no headache.  The diarrhea was one episode, the nausea has been persistent, the paramedics gave 4 of Zofran prior to arrival.  The patient still has her symptoms and is also complaining of some left leg pain which is going on for a week or 2.  No rash, no injury, no swelling  Past Medical History:  Diagnosis Date  . Anxiety   . CAD (coronary artery disease)    a. s/p DES to mid-LAD and DES to mid-OM1 in 11/2016  . Cervical disc disorder with myelopathy, unspecified cervical region   . Chronic systolic (congestive) heart failure ([place])   . COPD (chronic obstructive pulmonary disease) ([place])   . Coronary artery disease   . Disc disease with myelopathy, cervical   . Essential hypertension   . Hemorrhoids   . Liver mass   . Lung, cysts, congenital    Left lung cyst  . Myocardial infarction ([place])   . Nephrolithiasis    Embedded  . Nonischemic cardiomyopathy ([place])    LVEF 35-40% 2015  . On home O2   . Osteoarthritis   . Thoracic ascending aortic aneurysm ([facility])    4.3 cm April 2016    Patient Active Problem List   Diagnosis Date Noted  . Acute on chronic respiratory failure with hypoxia ([place]) 04/24/2018  . Chronic stable angina ([place]) 04/24/2018  . Physical deconditioning  04/24/2018  . CAD (coronary artery disease) 02/03/2018  . Ischemic cardiomyopathy 11/25/2017  . Dizziness 04/01/2017  . COPD (chronic obstructive pulmonary disease) ([place]) 04/01/2017  . CAD S/P percutaneous coronary angioplasty 04/01/2017  . NSTEMI (non-ST elevated myocardial infarction) ([place]) 11/23/2016  . Hypertensive cardiovascular disease 10/03/2015  . Protein-calorie malnutrition, severe 07/13/2015  . Demand ischemia of myocardium ([place]) 07/13/2015  . Nonischemic cardiomyopathy ([place])   . Atypical chest pain   . Left bundle branch block   . Flu-like symptoms 07/11/2015  . Elevated troponin 07/11/2015  . Acute bronchitis 07/11/2015  . GERD (gastroesophageal reflux disease) 07/11/2015  . Bronchospasm   . Ascending aortic aneurysm ([place]) 01/27/2015  . Chronic combined systolic and diastolic CHF (congestive heart failure) ([place]) 01/27/2015  . Chest pain at rest 01/27/2015  . Essential hypertension 01/27/2015  . Anxiety 01/27/2015  . Chronic pain 01/27/2015  . Chest pain 08/20/2013  . Hypertension 08/20/2013  . Contusion of left knee 08/07/2012  . Sprain of wrist 08/07/2012  . Liver mass 05/21/2011  . Abdominal pain 05/21/2011  . Bronchitis 05/21/2011  . De Quervain's disease (tenosynovitis) 04/02/2011  . SHOULDER PAIN 01/19/2009  . CERVICALGIA 01/19/2009  . IMPINGEMENT SYNDROME 01/19/2009    Past Surgical History:  Procedure Laterality Date  . Benign breast tumors    . CHOLECYSTECTOMY    .  COLONOSCOPY    . COLONOSCOPY N/A 09/22/2014   Procedure: COLONOSCOPY;  Surgeon: Rogene Houston, MD;  Location: [facility];  Service: Endoscopy;  Laterality: N/A;  830 -- to be done in OR under fluoro  . Complete hysterectomy    . CORONARY STENT INTERVENTION N/A 11/23/2016   Procedure: Coronary Stent Intervention;  Surgeon: Martinique, Peter M, MD;  Location: [facility];  Service: Cardiovascular;  Laterality: N/A;  . LEFT HEART CATH AND CORONARY ANGIOGRAPHY N/A 11/23/2016    Procedure: Left Heart Cath and Coronary Angiography;  Surgeon: Martinique, Peter M, MD;  Location: [facility];  Service: Cardiovascular;  Laterality: N/A;  . TONSILLECTOMY AND ADENOIDECTOMY       OB History    Gravida  3   Para  3   Term  3   Preterm      AB      Living  2     SAB      TAB      Ectopic      Multiple      Live Births              Family History  Problem Relation Age of Onset  . Heart disease Mother   . Aneurysm Father   . Lung cancer Brother   . Heart disease Sister   . Diabetes Brother   . Heart disease Brother     Social History   Tobacco Use  . Smoking status: Former Smoker    Packs/day: 0.25    Years: 3.00    Pack years: 0.75    Types: Cigarettes    Start date: 05/14/1977    Quit date: 05/28/2008    Years since quitting: 11.9  . Smokeless tobacco: Never Used  . Tobacco comment: patient states she only smoked for 3 years total  Vaping Use  . Vaping Use: Never used  Substance Use Topics  . Alcohol use: No    Alcohol/week: 0.0 standard drinks  . Drug use: No    Home Medications Prior to Admission medications   Medication Sig Start Date End Date Taking? Authorizing Provider  albuterol (PROVENTIL) (2.5 MG/3ML) 0.083% nebulizer solution Take 3 mLs (2.5 mg total) by nebulization every 6 (six) hours as needed for wheezing or shortness of breath. 07/13/15  Yes Sinda Du, MD  aspirin EC 81 MG tablet Take 81 mg by mouth every morning.    Yes [provider]  atorvastatin (LIPITOR) 80 MG tablet TAKE 1 TABLET BY MOUTH ONCE DAILY AT BEDTIME. Patient taking differently: Take 80 mg by mouth daily.  09/23/19  Yes Herminio Commons, MD  clorazepate (TRANXENE) 7.5 MG tablet Take 1 tablet (7.5 mg total) by mouth daily as needed for anxiety. For nerves 03/28/20  Yes [name], NP  ENTRESTO 24-26 MG TAKE 1 TABLET BY MOUTH TWICE DAILY. 03/30/20  Yes BranchAlphonse Guild, MD  fluticasone (FLONASE) 50 MCG/ACT nasal spray Place 1  spray into both nostrils daily. 02/23/20  Yes Chesley Mires, MD  Fluticasone-Umeclidin-Vilant (TRELEGY ELLIPTA) 200-62.5-25 MCG/INH AEPB Inhale 1 puff into the lungs daily. 02/23/20  Yes Chesley Mires, MD  ibuprofen (ADVIL) 800 MG tablet Take 800 mg by mouth 3 (three) times daily. 11/04/19  Yes [provider]  levocetirizine (XYZAL) 5 MG tablet Take 1 tablet (5 mg total) by mouth every evening. 02/23/20  Yes Chesley Mires, MD  meclizine (ANTIVERT) 25 MG tablet Take 25 mg by mouth as needed for dizziness.  Yes [provider]  meloxicam (MOBIC) 7.5 MG tablet Take 7.5 mg by mouth daily.    Yes [provider]  montelukast (SINGULAIR) 10 MG tablet Take 1 tablet (10 mg total) by mouth at bedtime. 02/23/20  Yes Chesley Mires, MD  nitroGLYCERIN (NITROSTAT) 0.4 MG SL tablet Place 1 tablet (0.4 mg total) under the tongue every 5 (five) minutes x 3 doses as needed for chest pain. 03/23/20  Yes Strader, Fransisco Hertz, PA-C  OXYGEN Inhale 2-2.5 L into the lungs at bedtime.    Yes [provider]  traMADol (ULTRAM) 50 MG tablet Take 50 mg by mouth daily as needed for moderate pain or severe pain. Maximum dose= 8 tablets per day   Yes [provider]  TRELEGY ELLIPTA 200-62.5-25 MCG/INH AEPB INHALE (1) PUFF BY MOUTH INTO THE LUNGS DAILY. Patient taking differently: Take 1 puff by mouth.  09/14/19  Yes Byrum, Rose Fillers, MD  VENTOLIN HFA 108 (90 Base) MCG/ACT inhaler INHALE 2 PUFFS INTO THE LUNGS FOUR TIMES DAILY. 02/24/20  Yes Chesley Mires, MD  carvedilol (COREG) 12.5 MG tablet Take 1 tablet (12.5 mg total) by mouth 2 (two) times daily. 09/22/19 03/23/20  Herminio Commons, MD  cephALEXin (KEFLEX) 500 MG capsule Take 1 capsule (500 mg total) by mouth 2 (two) times daily for 7 days. 04/20/20 04/27/20  Noemi Chapel, MD  ondansetron (ZOFRAN ODT) 4 MG disintegrating tablet Take 1 tablet (4 mg total) by mouth every 8 (eight) hours as needed for nausea. 04/20/20   Noemi Chapel, MD     Allergies    Plavix [clopidogrel bisulfate], Alprazolam, Codeine, Percodan [oxycodone-aspirin], and Valium  Review of Systems   Review of Systems  All other systems reviewed and are negative.   Physical Exam Updated Vital Signs BP (!) 145/71   Pulse 94   Temp (!) 101 F (38.3 C) (Rectal)   Resp 19   Ht 1.575 m (5\' 2" )   Wt 72.6 kg   SpO2 97%   BMI 29.26 kg/m   Physical Exam Vitals and nursing note reviewed.  Constitutional:      General: She is not in acute distress.    Appearance: She is well-developed.  HENT:     Head: Normocephalic and atraumatic.     Mouth/Throat:     Pharynx: No oropharyngeal exudate.  Eyes:     General: No scleral icterus.       Right eye: No discharge.        Left eye: No discharge.     Conjunctiva/sclera: Conjunctivae normal.     Pupils: Pupils are equal, round, and reactive to light.  Neck:     Thyroid: No thyromegaly.     Vascular: No JVD.  Cardiovascular:     Rate and Rhythm: Normal rate and regular rhythm.     Heart sounds: Normal heart sounds. No murmur heard.  No friction rub. No gallop.   Pulmonary:     Effort: Pulmonary effort is normal. No respiratory distress.     Breath sounds: Wheezing present. No rales.     Comments: Mild expiratory wheezing but speaks in full sentences and has no distress, oxygen 94% on room air Abdominal:     General: Bowel sounds are normal. There is no distension.     Palpations: Abdomen is soft. There is no mass.     Tenderness: There is abdominal tenderness.     Comments: Very soft abdomen, obese, minimal tenderness to the right and left lower quadrants, normal  bowel sounds  Musculoskeletal:        General: No tenderness. Normal range of motion.     Cervical back: Normal range of motion and neck supple.     Comments: Normal range of motion of both of her legs, there is no edema or asymmetry, there is mild tenderness to palpation in the quadricep muscle  Lymphadenopathy:     Cervical: No  cervical adenopathy.  Skin:    General: Skin is warm and dry.     Findings: No erythema or rash.  Neurological:     Mental Status: She is alert.     Coordination: Coordination normal.  Psychiatric:        Behavior: Behavior normal.     ED Results / Procedures / Treatments   Labs (all labs ordered are listed, but only abnormal results are displayed) Labs Reviewed  CBC WITH DIFFERENTIAL/PLATELET - Abnormal; Notable for the following components:      Result Value   WBC 3.3 (*)    Hemoglobin 10.7 (*)    HCT 35.2 (*)    RDW 16.0 (*)    All other components within normal limits  COMPREHENSIVE METABOLIC PANEL - Abnormal; Notable for the following components:   Potassium 3.4 (*)    Glucose, Bld 121 (*)    Calcium 8.7 (*)    Total Protein 6.2 (*)    All other components within normal limits  URINALYSIS, ROUTINE W REFLEX MICROSCOPIC - Abnormal; Notable for the following components:   APPearance HAZY (*)    Hgb urine dipstick SMALL (*)    Nitrite POSITIVE (*)    Leukocytes,Ua MODERATE (*)    Bacteria, UA RARE (*)    All other components within normal limits  URINE CULTURE  LACTIC ACID, PLASMA    EKG EKG Interpretation  Date/Time:  Wednesday April 20 2020 10:06:55 EST Ventricular Rate:  91 PR Interval:    QRS Duration: 163 QT Interval:  384 QTC Calculation: 473 R Axis:   16 Text Interpretation: Sinus rhythm Multiple ventricular premature complexes Left bundle branch block Since last tracing rate faster ectopy now seen Confirmed by Noemi Chapel [phone]) on 04/20/2020 10:12:10 AM   Radiology US Venous Img Lower Unilateral Left (DVT)  Result Date: 04/20/2020 CLINICAL DATA:  Left lower extremity pain. Former smoker. Evaluate for DVT. EXAM: LEFT LOWER EXTREMITY VENOUS DOPPLER ULTRASOUND TECHNIQUE: Gray-scale sonography with graded compression, as well as color Doppler and duplex ultrasound were performed to evaluate the lower extremity deep venous systems from the level of  the common femoral vein and including the common femoral, femoral, profunda femoral, popliteal and calf veins including the posterior tibial, peroneal and gastrocnemius veins when visible. The superficial great saphenous vein was also interrogated. Spectral Doppler was utilized to evaluate flow at rest and with distal augmentation maneuvers in the common femoral, femoral and popliteal veins. COMPARISON:  None. FINDINGS: Contralateral Common Femoral Vein: Respiratory phasicity is normal and symmetric with the symptomatic side. No evidence of thrombus. Normal compressibility. Common Femoral Vein: No evidence of thrombus. Normal compressibility, respiratory phasicity and response to augmentation. Saphenofemoral Junction: No evidence of thrombus. Normal compressibility and flow on color Doppler imaging. Profunda Femoral Vein: No evidence of thrombus. Normal compressibility and flow on color Doppler imaging. Femoral Vein: No evidence of thrombus. Normal compressibility, respiratory phasicity and response to augmentation. Popliteal Vein: No evidence of thrombus. Normal compressibility, respiratory phasicity and response to augmentation. Calf Veins: No evidence of thrombus. Normal compressibility and flow on color Doppler imaging.  Superficial Great Saphenous Vein: No evidence of thrombus. Normal compressibility. Venous Reflux:  None. Other Findings:  None. IMPRESSION: No evidence of DVT within the left lower extremity. Electronically Signed   By: Sandi Mariscal M.D.   On: 04/20/2020 13:17   DG Chest Port 1 View  Result Date: 04/20/2020 CLINICAL DATA:  Fever, cough EXAM: PORTABLE CHEST 1 VIEW COMPARISON:  11/07/2019 FINDINGS: Cardiomegaly. There is hyperinflation of the lungs compatible with COPD. No confluent opacities or effusions. No acute bony abnormality. IMPRESSION: Cardiomegaly, COPD. No active disease. Electronically Signed   By: Rolm Baptise M.D.   On: 04/20/2020 10:37    Procedures Procedures (including  critical care time)  Medications Ordered in ED Medications  cefTRIAXone (ROCEPHIN) 1 g in sodium chloride 0.9 % 100 mL IVPB (1 g Intravenous New Bag/Given 04/20/20 1306)  0.9 %  sodium chloride infusion ( Intravenous New Bag/Given 04/20/20 1020)  ondansetron (ZOFRAN) injection 4 mg (4 mg Intravenous Given 04/20/20 1019)    ED Course  I have reviewed the triage vital signs and the nursing notes.  Pertinent labs & imaging results that were available during my care of the patient were reviewed by me and considered in my medical decision making (see chart for details).  Clinical Course as of Apr 20 1324  Wed Apr 20, 2020  1321 Lactic acid is 1.2, labs show chronic anemia stable, chronic mild leukopenia, unremarkable metabolic panel and a urinalysis with signs of UTI.  She has been given Rocephin, she appears well, she may well have a gastrointestinal bug causing some diarrhea but has very little if any abdominal tenderness thus at this point I think she is stable for discharge on cephalexin.   [BM]    Clinical Course User Index [BM] Noemi Chapel, MD   MDM Rules/Calculators/A&P                          The patient is concerned about a blood clot in her leg -she thinks that she has a fever but here does not have a fever of 98.8, blood pressure is normal heart rates normal and oxygen is 94%.  She has had nausea and diarrhea, will check for other causes of possible fever, she may have some gastroenteritis, she does not have any significant abdominal tenderness, the patient is agreeable to the plan  Vitals:   04/20/20 1200 04/20/20 1300  BP: (!) 155/64 (!) 145/71  Pulse: 91 94  Resp: 19 19  Temp:    SpO2: 94% 97%     Final Clinical Impression(s) / ED Diagnoses Final diagnoses:  Urinary tract infection without hematuria, site unspecified    Rx / DC Orders ED Discharge Orders         Ordered    cephALEXin (KEFLEX) 500 MG capsule  2 times daily        04/20/20 1323    ondansetron  (ZOFRAN ODT) 4 MG disintegrating tablet  Every 8 hours PRN        04/20/20 1323           Noemi Chapel, MD 04/20/20 1325

## 2020-04-23 LAB — URINE CULTURE: Culture: >=100000 — AB

## 2020-04-27 ENCOUNTER — Other Ambulatory Visit: Payer: Self-pay

## 2020-04-27 ENCOUNTER — Telehealth (INDEPENDENT_AMBULATORY_CARE_PROVIDER_SITE_OTHER): Payer: Medicare Other | Admitting: Nurse Practitioner

## 2020-04-27 ENCOUNTER — Encounter: Payer: Self-pay | Admitting: Nurse Practitioner

## 2020-04-27 VITALS — BP 155/74 | HR 76 | Temp 98.9°F

## 2020-04-27 DIAGNOSIS — G894 Chronic pain syndrome: Secondary | ICD-10-CM | POA: Diagnosis not present

## 2020-04-27 DIAGNOSIS — R4189 Other symptoms and signs involving cognitive functions and awareness: Secondary | ICD-10-CM

## 2020-04-27 DIAGNOSIS — R32 Unspecified urinary incontinence: Secondary | ICD-10-CM

## 2020-04-27 DIAGNOSIS — R7301 Impaired fasting glucose: Secondary | ICD-10-CM | POA: Diagnosis not present

## 2020-04-27 DIAGNOSIS — F419 Anxiety disorder, unspecified: Secondary | ICD-10-CM

## 2020-04-27 NOTE — Assessment & Plan Note (Signed)
-  states that [facility] clinic tried to take her driver's license -will check MMSE at next visit

## 2020-04-27 NOTE — Progress Notes (Signed)
Established Patient Office Visit  Subjective:  Patient ID: [name], female    DOB: 1935/12/31  Age: 84 y.o. MRN: [id number]  CC:  Chief Complaint  Patient presents with  . Follow-up    1 month follow up.     HPI [name] presents for medication check/pill count for chlorazepate. She states she is unable to walk today, so she cannot come in.  Previously, she had not taken chlorazepate since 2020, but requested this for PRN use and it was filled at last OV. She states she has just been taking this PRN and does not need a refill today.  She states that last week she had to go to ED at [facility] for UTI.  She is having arthritic pain to her leg that is preventing her from coming to the office. She states her pain runs up and down her entire left leg.  She is not interested in medication today.  Past Medical History:  Diagnosis Date  . Acute on chronic respiratory failure with hypoxia ([place]) 04/24/2018  . Anxiety   . Atypical chest pain   . Bronchospasm   . CAD (coronary artery disease)    a. s/p DES to mid-LAD and DES to mid-OM1 in 11/2016  . Cervical disc disorder with myelopathy, unspecified cervical region   . Cervicalgia 01/19/2009   Qualifier: Diagnosis of  By: Aline Brochure MD, Dorothyann Peng    . Chest pain at rest 01/27/2015  . Chronic systolic (congestive) heart failure ([place])   . COPD (chronic obstructive pulmonary disease) ([place])   . Coronary artery disease   . De Quervain's disease (tenosynovitis) 04/02/2011  . Disc disease with myelopathy, cervical   . Essential hypertension   . Hemorrhoids   . Liver mass   . Lung, cysts, congenital    Left lung cyst  . Myocardial infarction ([place])   . Nephrolithiasis    Embedded  . Nonischemic cardiomyopathy ([place])    LVEF 35-40% 2015  . On home O2   . Osteoarthritis   . Sprain of wrist 08/07/2012  . Thoracic ascending aortic aneurysm (HCC)    4.3 cm April 2016    Past Surgical History:  Procedure Laterality Date  . Benign breast  tumors    . CHOLECYSTECTOMY    . COLONOSCOPY    . COLONOSCOPY N/A 09/22/2014   Procedure: COLONOSCOPY;  Surgeon: Rogene Houston, MD;  [facility];  Service: Endoscopy;  Laterality: N/A;  830 -- to be done in OR under fluoro  . Complete hysterectomy    . CORONARY STENT INTERVENTION N/A 11/23/2016   Procedure: Coronary Stent Intervention;  Surgeon: Martinique, Peter M, MD;  Location: [facility];  Service: Cardiovascular;  Laterality: N/A;  . LEFT HEART CATH AND CORONARY ANGIOGRAPHY N/A 11/23/2016   Procedure: Left Heart Cath and Coronary Angiography;  Surgeon: Martinique, Peter M, MD;  Location: [place];  Service: Cardiovascular;  Laterality: N/A;  . TONSILLECTOMY AND ADENOIDECTOMY      Family History  Problem Relation Age of Onset  . Heart disease Mother   . Aneurysm Father   . Lung cancer Brother   . Heart disease Sister   . Diabetes Brother   . Heart disease Brother     Social History   Socioeconomic History  . Marital status: Widowed    Spouse name: Not on file  . Number of children: Not on file  . Years of education: 9th  . Highest education level: Not  on file  Occupational History    Employer: RETIRED  Tobacco Use  . Smoking status: Former Smoker    Packs/day: 0.25    Years: 3.00    Pack years: 0.75    Types: Cigarettes    Start date: 05/14/1977    Quit date: 05/28/2008    Years since quitting: 11.9  . Smokeless tobacco: Never Used  . Tobacco comment: patient states she only smoked for 3 years total  Vaping Use  . Vaping Use: Never used  Substance and Sexual Activity  . Alcohol use: No    Alcohol/week: 0.0 standard drinks  . Drug use: No  . Sexual activity: Never  Other Topics Concern  . Not on file  Social History Narrative  . Not on file   Social Determinants of Health   Financial Resource Strain: Not on file  Food Insecurity: Not on file  Transportation Needs: Not on file  Physical Activity: Not on file  Stress: Not on file   Social Connections: Not on file  Intimate Partner Violence: Not on file    Outpatient Medications Prior to Visit  Medication Sig Dispense Refill  . albuterol (PROVENTIL) (2.5 MG/3ML) 0.083% nebulizer solution Take 3 mLs (2.5 mg total) by nebulization every 6 (six) hours as needed for wheezing or shortness of breath. 75 mL 12  . aspirin EC 81 MG tablet Take 81 mg by mouth every morning.     Marland Kitchen atorvastatin (LIPITOR) 80 MG tablet TAKE 1 TABLET BY MOUTH ONCE DAILY AT BEDTIME. (Patient taking differently: Take 80 mg by mouth daily.) 90 tablet 1  . cephALEXin (KEFLEX) 500 MG capsule Take 1 capsule (500 mg total) by mouth 2 (two) times daily for 7 days. 14 capsule 0  . clorazepate (TRANXENE) 7.5 MG tablet Take 1 tablet (7.5 mg total) by mouth daily as needed for anxiety. For nerves 30 tablet 0  . ENTRESTO 24-26 MG TAKE 1 TABLET BY MOUTH TWICE DAILY. 60 tablet 6  . fluticasone (FLONASE) 50 MCG/ACT nasal spray Place 1 spray into both nostrils daily. 16 g 2  . Fluticasone-Umeclidin-Vilant (TRELEGY ELLIPTA) 200-62.5-25 MCG/INH AEPB Inhale 1 puff into the lungs daily. 60 each 0  . ibuprofen (ADVIL) 800 MG tablet Take 800 mg by mouth 3 (three) times daily.    Marland Kitchen levocetirizine (XYZAL) 5 MG tablet Take 1 tablet (5 mg total) by mouth every evening. 30 tablet 1  . meclizine (ANTIVERT) 25 MG tablet Take 25 mg by mouth as needed for dizziness.    . meloxicam (MOBIC) 7.5 MG tablet Take 7.5 mg by mouth daily.     . montelukast (SINGULAIR) 10 MG tablet Take 1 tablet (10 mg total) by mouth at bedtime. 30 tablet 5  . nitroGLYCERIN (NITROSTAT) 0.4 MG SL tablet Place 1 tablet (0.4 mg total) under the tongue every 5 (five) minutes x 3 doses as needed for chest pain. 25 tablet 3  . ondansetron (ZOFRAN ODT) 4 MG disintegrating tablet Take 1 tablet (4 mg total) by mouth every 8 (eight) hours as needed for nausea. 10 tablet 0  . OXYGEN Inhale 2-2.5 L into the lungs at bedtime.     . traMADol (ULTRAM) 50 MG tablet Take 50  mg by mouth daily as needed for moderate pain or severe pain. Maximum dose= 8 tablets per day    . TRELEGY ELLIPTA 200-62.5-25 MCG/INH AEPB INHALE (1) PUFF BY MOUTH INTO THE LUNGS DAILY. (Patient taking differently: Take 1 puff by mouth.) 60 each 5  . VENTOLIN  HFA 108 (90 Base) MCG/ACT inhaler INHALE 2 PUFFS INTO THE LUNGS FOUR TIMES DAILY. 18 g PRN  . carvedilol (COREG) 12.5 MG tablet Take 1 tablet (12.5 mg total) by mouth 2 (two) times daily. 180 tablet 3   No facility-administered medications prior to visit.    Allergies  Allergen Reactions  . Plavix [Clopidogrel Bisulfate] Itching    Severe itching  . Alprazolam Nausea And Vomiting  . Codeine Nausea And Vomiting  . Percodan [Oxycodone-Aspirin] Nausea And Vomiting  . Valium Nausea And Vomiting    ROS Review of Systems  Constitutional: Negative.   Respiratory: Negative.   Cardiovascular: Negative.   Musculoskeletal: Positive for arthralgias.       To left leg      Objective:    Physical Exam  BP (!) 155/74   Pulse 76   Temp 98.9 F (37.2 C)   SpO2 90%  Wt Readings from Last 3 Encounters:  04/20/20 160 lb (72.6 kg)  03/28/20 162 lb (73.5 kg)  03/23/20 164 lb (74.4 kg)     Health Maintenance Due  Topic Date Due  . COVID-19 Vaccine (1) Never done  . TETANUS/TDAP  Never done  . PNA vac Low Risk Adult (2 of 2 - PCV13) 07/11/2016  . INFLUENZA VACCINE  12/13/2019    There are no preventive care reminders to display for this patient.  No results found for: TSH Lab Results  Component Value Date   WBC 3.3 (L) 04/20/2020   HGB 10.7 (L) 04/20/2020   HCT 35.2 (L) 04/20/2020   MCV 85.9 04/20/2020   PLT 223 04/20/2020   Lab Results  Component Value Date   NA 138 04/20/2020   K 3.4 (L) 04/20/2020   CO2 25 04/20/2020   GLUCOSE 121 (H) 04/20/2020   BUN 11 04/20/2020   CREATININE 0.65 04/20/2020   BILITOT 0.3 04/20/2020   ALKPHOS 64 04/20/2020   AST 24 04/20/2020   ALT 17 04/20/2020   PROT 6.2 (L) 04/20/2020    ALBUMIN 3.6 04/20/2020   CALCIUM 8.7 (L) 04/20/2020   ANIONGAP 8 04/20/2020   Lab Results  Component Value Date   CHOL 171 12/19/2018   Lab Results  Component Value Date   HDL 79 12/19/2018   Lab Results  Component Value Date   LDLCALC 81 12/19/2018   Lab Results  Component Value Date   TRIG 53 12/19/2018   Lab Results  Component Value Date   CHOLHDL 2.2 12/19/2018   No results found for: HGBA1C    Assessment & Plan:   Problem List Items Addressed This Visit      Endocrine   Impaired fasting glucose    Impaired Fasting Glucose -had elevated CBG at last appointment -needs A1c with next set of labs        Other   Anxiety - Primary    -refilled clorazepate 7.5 mg PO daily PRN -has been taking this for many years; originally prescribed by Dr. Luan Pulling -PMP Aware database shows last fill 11/18/212 (prior to that 03/24/2019) -she is not willing to come in for appointment for pill count today d/t leg pain, but she is not requesting refill -UDS at next appt. Along with pill counts       Chronic pain    -to left shoulder, had surgery previously -takes meloxicam -takes tramadol; last fill 03/15/20; will be due 04/14/20 -will refill when due -she will notify us when she needs a refill      Cognitive impairment    -  states that [facility] tried to [facility] her driver's license -will check MMSE at next visit      Relevant Orders   CBC with Differential/Platelet   CMP14+EGFR   Lipid Panel With LDL/HDL Ratio   Urinary incontinence    -wears pull-ups -will consider sending in incontinence supplies as DME order if she needs these in the future         No orders of the defined types were placed in this encounter.   Follow-up: Return in about 3 months (around 07/26/2020) for Lab follow-up.   Date:  04/27/2020   Location of Patient: Home Location of Provider: Office Consent was obtain for visit to be over via telehealth. I verified that I am speaking with  the correct person using two identifiers.  I connected with  Arla Boutwell Kavan on 04/27/20 via telephone and verified that I am speaking with the correct person using two identifiers.   I discussed the limitations of evaluation and management by telemedicine. The patient expressed understanding and agreed to proceed.  Time spent: 15 minutes   [name], NP

## 2020-04-27 NOTE — Assessment & Plan Note (Signed)
-  to left shoulder, had surgery previously -takes meloxicam -takes tramadol; last fill 03/15/20; will be due 04/14/20 -will refill when due -she will notify us when she needs a refill

## 2020-04-27 NOTE — Assessment & Plan Note (Signed)
Impaired Fasting Glucose -had elevated CBG at last appointment -needs A1c with next set of labs

## 2020-04-27 NOTE — Assessment & Plan Note (Signed)
-  wears pull-ups -will consider sending in incontinence supplies as DME order if she needs these in the future

## 2020-04-27 NOTE — Assessment & Plan Note (Addendum)
-  refilled clorazepate 7.5 mg PO daily PRN -has been taking this for many years; originally prescribed by Dr. Luan Pulling -PMP Aware database shows last fill 11/18/212 (prior to that 03/24/2019) -she is not willing to come in for appointment for pill count today d/t leg pain, but she is not requesting refill -UDS at next appt. Along with pill counts

## 2020-04-29 ENCOUNTER — Other Ambulatory Visit: Payer: Self-pay | Admitting: Cardiology

## 2020-05-05 ENCOUNTER — Other Ambulatory Visit: Payer: Medicare Other

## 2020-05-10 ENCOUNTER — Other Ambulatory Visit: Payer: Medicare Other

## 2020-05-10 ENCOUNTER — Other Ambulatory Visit: Payer: Self-pay

## 2020-05-10 DIAGNOSIS — Z20822 Contact with and (suspected) exposure to covid-19: Secondary | ICD-10-CM | POA: Diagnosis not present

## 2020-05-12 LAB — SARS-COV-2, NAA 2 DAY TAT

## 2020-05-12 LAB — SPECIMEN STATUS REPORT

## 2020-05-12 LAB — NOVEL CORONAVIRUS, NAA: SARS-CoV-2, NAA: NOT DETECTED

## 2020-05-24 ENCOUNTER — Telehealth (INDEPENDENT_AMBULATORY_CARE_PROVIDER_SITE_OTHER): Payer: Medicare Other | Admitting: Internal Medicine

## 2020-05-24 ENCOUNTER — Other Ambulatory Visit: Payer: Self-pay

## 2020-05-24 ENCOUNTER — Encounter: Payer: Self-pay | Admitting: Internal Medicine

## 2020-05-24 VITALS — BP 128/76 | HR 60 | Temp 98.8°F | Resp 18 | Wt 165.0 lb

## 2020-05-24 DIAGNOSIS — N3000 Acute cystitis without hematuria: Secondary | ICD-10-CM

## 2020-05-24 MED ORDER — SULFAMETHOXAZOLE-TRIMETHOPRIM 800-160 MG PO TABS: 1.0000 | ORAL_TABLET | Freq: Two times a day (BID) | ORAL | 5 refills | Status: DC

## 2020-05-24 NOTE — Patient Instructions (Signed)
Urinary Tract Infection, Adult A urinary tract infection (UTI) is an infection of any part of the urinary tract. The urinary tract includes:  The kidneys.  The ureters.  The bladder.  The urethra. These organs make, store, and get rid of pee (urine) in the body. What are the causes? This infection is caused by germs (bacteria) in your genital area. These germs grow and cause swelling (inflammation) of your urinary tract. What increases the risk? The following factors may make you more likely to develop this condition:  Using a small, thin tube (catheter) to drain pee.  Not being able to control when you pee or poop (incontinence).  Being female. If you are female, these things can increase the risk: ? Using these methods to prevent pregnancy:  A medicine that kills sperm (spermicide).  A device that blocks sperm (diaphragm). ? Having low levels of a female hormone (estrogen). ? Being pregnant. You are more likely to develop this condition if:  You have genes that add to your risk.  You are sexually active.  You take antibiotic medicines.  You have trouble peeing because of: ? A prostate that is bigger than normal, if you are female. ? A blockage in the part of your body that drains pee from the bladder. ? A kidney stone. ? A nerve condition that affects your bladder. ? Not getting enough to drink. ? Not peeing often enough.  You have other conditions, such as: ? Diabetes. ? A weak disease-fighting system (immune system). ? Sickle cell disease. ? Gout. ? Injury of the spine. What are the signs or symptoms? Symptoms of this condition include:  Needing to pee right away.  Peeing small amounts often.  Pain or burning when peeing.  Blood in the pee.  Pee that smells bad or not like normal.  Trouble peeing.  Pee that is cloudy.  Fluid coming from the vagina, if you are female.  Pain in the belly or lower back. Other symptoms include:  Vomiting.  Not  feeling hungry.  Feeling mixed up (confused). This may be the first symptom in older adults.  Being tired and grouchy (irritable).  A fever.  Watery poop (diarrhea). How is this treated?  Taking antibiotic medicine.  Taking other medicines.  Drinking enough water. In some cases, you may need to see a specialist. Follow these instructions at home: Medicines  Take over-the-counter and prescription medicines only as told by your doctor.  If you were prescribed an antibiotic medicine, take it as told by your doctor. Do not stop taking it even if you start to feel better. General instructions  Make sure you: ? Pee until your bladder is empty. ? Do not hold pee for a long time. ? Empty your bladder after sex. ? Wipe from front to back after peeing or pooping if you are a female. Use each tissue one time when you wipe.  Drink enough fluid to keep your pee pale yellow.  Keep all follow-up visits.   Contact a doctor if:  You do not get better after 1-2 days.  Your symptoms go away and then come back. Get help right away if:  You have very bad back pain.  You have very bad pain in your lower belly.  You have a fever.  You have chills.  You feeling like you will vomit or you vomit. Summary  A urinary tract infection (UTI) is an infection of any part of the urinary tract.  This condition is caused by   germs in your genital area.  There are many risk factors for a UTI.  Treatment includes antibiotic medicines.  Drink enough fluid to keep your pee pale yellow. This information is not intended to replace advice given to you by your health care provider. Make sure you discuss any questions you have with your health care provider. Document Revised: 12/11/2019 Document Reviewed: 12/11/2019 Elsevier Patient Education  2021 Elsevier Inc.  

## 2020-05-24 NOTE — Progress Notes (Signed)
Virtual Visit via Telephone Note   This visit type was conducted due to national recommendations for restrictions regarding the COVID-19 Pandemic (e.g. social distancing) in an effort to limit this patient's exposure and mitigate transmission in our community.  Due to her co-morbid illnesses, this patient is at least at moderate risk for complications without adequate follow up.  This format is felt to be most appropriate for this patient at this time.  The patient did not have access to video technology/had technical difficulties with video requiring transitioning to audio format only (telephone).  All issues noted in this document were discussed and addressed.  No physical exam could be performed with this format.  Evaluation Performed:  Follow-up visit  Date:  05/24/2020   ID:  [name], DOB 31-May-1935, MRN [id number]  Patient Location: Home Provider Location: Office/Clinic  Participants: Patient Location of Patient: Home Location of Provider: Telehealth Consent was obtain for visit to be over via telehealth. I verified that I am speaking with the correct person using two identifiers.  PCP:  [name], NP   Chief Complaint:  Dysuria and urinary frequency  History of Present Illness:    [name] is a 85 y.o. female who has a televisit for c/o dysuria and urinary frequency for last 1 week.  She has been having low grade fever and lower abdominal discomfort. She denies hematuria or flank pain. Of note, she has a h/o uterine prolapse and has had chronic urinary incontinence. She states that she recently completed steroid treatment for her COPD. She denies nausea, vomiting, or diarrhea currently.  The patient does not have symptoms concerning for COVID-19 infection (fever, chills, cough, or new shortness of breath).   Past Medical, Surgical, Social History, Allergies, and Medications have been Reviewed.  Past Medical History:  Diagnosis Date  . Acute on chronic  respiratory failure with hypoxia ([place]) 04/24/2018  . Anxiety   . Atypical chest pain   . Bronchospasm   . CAD (coronary artery disease)    a. s/p DES to mid-LAD and DES to mid-OM1 in 11/2016  . Cervical disc disorder with myelopathy, unspecified cervical region   . Cervicalgia 01/19/2009   Qualifier: Diagnosis of  By: Aline Brochure MD, Dorothyann Peng    . Chest pain at rest 01/27/2015  . Chronic systolic (congestive) heart failure ([place])   . COPD (chronic obstructive pulmonary disease) ([place])   . Coronary artery disease   . De Quervain's disease (tenosynovitis) 04/02/2011  . Disc disease with myelopathy, cervical   . Essential hypertension   . Hemorrhoids   . Liver mass   . Lung, cysts, congenital    Left lung cyst  . Myocardial infarction ([place])   . Nephrolithiasis    Embedded  . Nonischemic cardiomyopathy ([place])    LVEF 35-40% 2015  . On home O2   . Osteoarthritis   . Sprain of wrist 08/07/2012  . Thoracic ascending aortic aneurysm ([facility])    4.3 cm April 2016   Past Surgical History:  Procedure Laterality Date  . Benign breast tumors    . CHOLECYSTECTOMY    . COLONOSCOPY    . COLONOSCOPY N/A 09/22/2014   Procedure: COLONOSCOPY;  Surgeon: Rogene Houston, MD;  [facility];  Service: Endoscopy;  Laterality: N/A;  830 -- to be done in OR under fluoro  . Complete hysterectomy    . CORONARY STENT INTERVENTION N/A 11/23/2016   Procedure: Coronary Stent Intervention;  Surgeon: Martinique, Peter M,  MD;  Location: [place];  Service: Cardiovascular;  Laterality: N/A;  . LEFT HEART CATH AND CORONARY ANGIOGRAPHY N/A 11/23/2016   Procedure: Left Heart Cath and Coronary Angiography;  Surgeon: Martinique, Peter M, MD;  Location: [place] [facility];  Service: Cardiovascular;  Laterality: N/A;  . TONSILLECTOMY AND ADENOIDECTOMY       Current Meds  Medication Sig  . albuterol (PROVENTIL) (2.5 MG/3ML) 0.083% nebulizer solution Take 3 mLs (2.5 mg total) by nebulization every 6 (six) hours as  needed for wheezing or shortness of breath.  Marland Kitchen aspirin EC 81 MG tablet Take 81 mg by mouth every morning.   Marland Kitchen atorvastatin (LIPITOR) 80 MG tablet TAKE 1 TABLET BY MOUTH ONCE DAILY AT BEDTIME.  . clorazepate (TRANXENE) 7.5 MG tablet Take 1 tablet (7.5 mg total) by mouth daily as needed for anxiety. For nerves  . ENTRESTO 24-26 MG TAKE 1 TABLET BY MOUTH TWICE DAILY.  . fluticasone (FLONASE) 50 MCG/ACT nasal spray Place 1 spray into both nostrils daily.  . Fluticasone-Umeclidin-Vilant (TRELEGY ELLIPTA) 200-62.5-25 MCG/INH AEPB Inhale 1 puff into the lungs daily.  Marland Kitchen ibuprofen (ADVIL) 800 MG tablet Take 800 mg by mouth 3 (three) times daily.  Marland Kitchen levocetirizine (XYZAL) 5 MG tablet Take 1 tablet (5 mg total) by mouth every evening.  . meclizine (ANTIVERT) 25 MG tablet Take 25 mg by mouth as needed for dizziness.  . meloxicam (MOBIC) 7.5 MG tablet Take 7.5 mg by mouth daily.   . montelukast (SINGULAIR) 10 MG tablet Take 1 tablet (10 mg total) by mouth at bedtime.  . nitroGLYCERIN (NITROSTAT) 0.4 MG SL tablet Place 1 tablet (0.4 mg total) under the tongue every 5 (five) minutes x 3 doses as needed for chest pain.  Marland Kitchen ondansetron (ZOFRAN ODT) 4 MG disintegrating tablet Take 1 tablet (4 mg total) by mouth every 8 (eight) hours as needed for nausea.  . OXYGEN Inhale 2-2.5 L into the lungs at bedtime.   . sulfamethoxazole-trimethoprim (BACTRIM DS) 800-160 MG tablet Take 1 tablet by mouth 2 (two) times daily.  . traMADol (ULTRAM) 50 MG tablet Take 50 mg by mouth daily as needed for moderate pain or severe pain. Maximum dose= 8 tablets per day  . TRELEGY ELLIPTA 200-62.5-25 MCG/INH AEPB INHALE (1) PUFF BY MOUTH INTO THE LUNGS DAILY. (Patient taking differently: Take 1 puff by mouth.)  . VENTOLIN HFA 108 (90 Base) MCG/ACT inhaler INHALE 2 PUFFS INTO THE LUNGS FOUR TIMES DAILY.     Allergies:   Plavix [clopidogrel bisulfate], Alprazolam, Codeine, Percodan [oxycodone-aspirin], and Valium   ROS:   Please see the  history of present illness.     All other systems reviewed and are negative.   Labs/Other Tests and Data Reviewed:    Recent Labs: 11/07/2019: B Natriuretic Peptide 311.0 04/20/2020: ALT 17; BUN 11; Creatinine, Ser 0.65; Hemoglobin 10.7; Platelets 223; Potassium 3.4; Sodium 138   Recent Lipid Panel Lab Results  Component Value Date/Time   CHOL 171 12/19/2018 05:28 AM   TRIG 53 12/19/2018 05:28 AM   HDL 79 12/19/2018 05:28 AM   CHOLHDL 2.2 12/19/2018 05:28 AM   LDLCALC 81 12/19/2018 05:28 AM    Wt Readings from Last 3 Encounters:  05/24/20 165 lb (74.8 kg)  04/20/20 160 lb (72.6 kg)  03/28/20 162 lb (73.5 kg)     ASSESSMENT & PLAN:    UTI, likely acute cystitis UA and urine culture Prescribed Bactrim DS Advised to increase fluid intake to about 1 liter (h/o  CHF) Routine perineal area hygiene  Time:   Today, I have spent 12 minutes reviewing the chart, including problem list, medications, and with the patient with telehealth technology discussing the above problems.   Medication Adjustments/Labs and Tests Ordered: Current medicines are reviewed at length with the patient today.  Concerns regarding medicines are outlined above.   Tests Ordered: Orders Placed This Encounter  Procedures  . UA/M w/rflx Culture, Routine    Medication Changes: Meds ordered this encounter  Medications  . sulfamethoxazole-trimethoprim (BACTRIM DS) 800-160 MG tablet    Sig: Take 1 tablet by mouth 2 (two) times daily.    Dispense:  10 tablet    Refill:  5     Note: This dictation was prepared with Dragon dictation along with smaller phrase technology. Similar sounding words can be transcribed inadequately or may not be corrected upon review. Any transcriptional errors that result from this process are unintentional.      Disposition:  Follow up  Signed, Lindell Spar, MD  05/24/2020 12:38 PM     [place]

## 2020-05-25 DIAGNOSIS — R4189 Other symptoms and signs involving cognitive functions and awareness: Secondary | ICD-10-CM | POA: Diagnosis not present

## 2020-05-25 DIAGNOSIS — N3000 Acute cystitis without hematuria: Secondary | ICD-10-CM | POA: Diagnosis not present

## 2020-05-26 ENCOUNTER — Other Ambulatory Visit: Payer: Self-pay | Admitting: Nurse Practitioner

## 2020-05-26 LAB — CBC WITH DIFFERENTIAL/PLATELET
Basophils Absolute: 0 10*3/uL (ref 0.0–0.2)
Basos: 1 %
EOS (ABSOLUTE): 0.4 10*3/uL (ref 0.0–0.4)
Eos: 7 %
Hematocrit: 35.3 % (ref 34.0–46.6)
Hemoglobin: 11.3 g/dL (ref 11.1–15.9)
Immature Grans (Abs): 0 10*3/uL (ref 0.0–0.1)
Immature Granulocytes: 0 %
Lymphocytes Absolute: 2.1 10*3/uL (ref 0.7–3.1)
Lymphs: 34 %
MCH: 25.5 pg — ABNORMAL LOW (ref 26.6–33.0)
MCHC: 32 g/dL (ref 31.5–35.7)
MCV: 80 fL (ref 79–97)
Monocytes Absolute: 0.8 10*3/uL (ref 0.1–0.9)
Monocytes: 13 %
Neutrophils Absolute: 2.7 10*3/uL (ref 1.4–7.0)
Neutrophils: 45 %
Platelets: 298 10*3/uL (ref 150–450)
RBC: 4.44 x10E6/uL (ref 3.77–5.28)
RDW: 14.2 % (ref 11.7–15.4)
WBC: 6.1 10*3/uL (ref 3.4–10.8)

## 2020-05-26 LAB — UA/M W/RFLX CULTURE, ROUTINE
Bilirubin, UA: NEGATIVE
Glucose, UA: NEGATIVE
Ketones, UA: NEGATIVE
Leukocytes,UA: NEGATIVE
Nitrite, UA: NEGATIVE
Protein,UA: NEGATIVE
RBC, UA: NEGATIVE
Specific Gravity, UA: <=1.005 — AB (ref 1.005–1.030)
Urobilinogen, Ur: 0.2 mg/dL (ref 0.2–1.0)
pH, UA: 6.5 (ref 5.0–7.5)

## 2020-05-26 LAB — CMP14+EGFR
ALT: 10 IU/L (ref 0–32)
AST: 20 IU/L (ref 0–40)
Albumin/Globulin Ratio: 2.1 (ref 1.2–2.2)
Albumin: 4.2 g/dL (ref 3.6–4.6)
Alkaline Phosphatase: 73 IU/L (ref 44–121)
BUN/Creatinine Ratio: 17 (ref 12–28)
BUN: 12 mg/dL (ref 8–27)
Bilirubin Total: 0.6 mg/dL (ref 0.0–1.2)
CO2: 26 mmol/L (ref 20–29)
Calcium: 10 mg/dL (ref 8.7–10.3)
Chloride: 105 mmol/L (ref 96–106)
Creatinine, Ser: 0.71 mg/dL (ref 0.57–1.00)
GFR calc Af Amer: 92 mL/min/{1.73_m2} (ref >59)
GFR calc non Af Amer: 80 mL/min/{1.73_m2} (ref >59)
Globulin, Total: 2 g/dL (ref 1.5–4.5)
Glucose: 97 mg/dL (ref 65–99)
Potassium: 4.5 mmol/L (ref 3.5–5.2)
Sodium: 145 mmol/L — ABNORMAL HIGH (ref 134–144)
Total Protein: 6.2 g/dL (ref 6.0–8.5)

## 2020-05-26 LAB — MICROSCOPIC EXAMINATION
Bacteria, UA: NONE SEEN
Casts: NONE SEEN /lpf
RBC, Urine: NONE SEEN /hpf (ref 0–2)

## 2020-05-26 LAB — LIPID PANEL WITH LDL/HDL RATIO
Cholesterol, Total: 235 mg/dL — ABNORMAL HIGH (ref 100–199)
HDL: 89 mg/dL (ref >39)
LDL Chol Calc (NIH): 136 mg/dL — ABNORMAL HIGH (ref 0–99)
LDL/HDL Ratio: 1.5 ratio (ref 0.0–3.2)
Triglycerides: 58 mg/dL (ref 0–149)
VLDL Cholesterol Cal: 10 mg/dL (ref 5–40)

## 2020-05-26 MED ORDER — EZETIMIBE 10 MG PO TABS: 10.0000 mg | ORAL_TABLET | Freq: Every day | ORAL | 3 refills | Status: DC

## 2020-05-26 NOTE — Progress Notes (Signed)
Her labs look good except for her bad cholesterol, or LDL.  SHe is already taking a high dose of atorvastatin, so I called in Zetia, or ezetimibe, for her cholesterol.  Can we set her up for a lab f/u in 3 months to recheck lipids, CBC, and CMP? Looks like she has no future appts.

## 2020-05-27 ENCOUNTER — Encounter: Payer: Self-pay | Admitting: Nurse Practitioner

## 2020-05-27 ENCOUNTER — Ambulatory Visit (INDEPENDENT_AMBULATORY_CARE_PROVIDER_SITE_OTHER): Payer: Medicare Other | Admitting: Nurse Practitioner

## 2020-05-27 ENCOUNTER — Other Ambulatory Visit: Payer: Self-pay

## 2020-05-27 DIAGNOSIS — I639 Cerebral infarction, unspecified: Secondary | ICD-10-CM

## 2020-05-27 DIAGNOSIS — N6320 Unspecified lump in the left breast, unspecified quadrant: Secondary | ICD-10-CM

## 2020-05-27 DIAGNOSIS — N6324 Unspecified lump in the left breast, lower inner quadrant: Secondary | ICD-10-CM

## 2020-05-27 NOTE — Assessment & Plan Note (Signed)
-  ordered diagnostic mammogram today -at 7 o'clock; approximately 2 inches by half an inch

## 2020-05-27 NOTE — Progress Notes (Signed)
Acute Office Visit  Subjective:    Patient ID: [name], female    DOB: 1936/04/27, 85 y.o.   MRN: [id number]  Chief Complaint  Patient presents with  . Breast Pain    L breast pain x 3 weeks     HPI Patient is in today for left abdominal/chest pain. Three weeks ago she noticed some facial droop on her left side. She has some left facial numbness.  Additionally, she has left leg pain and left arm weakness.  She states she has been staggering when she walk.  She has pain left chest pain that is worse with a deep breath.  She rates her pain at 10/10 when she takes a deep breath.  She has hx of meningioma with 2017 imaging, this was reviewed today.  Past Medical History:  Diagnosis Date  . Acute on chronic respiratory failure with hypoxia ([place]) 04/24/2018  . Anxiety   . Atypical chest pain   . Bronchospasm   . CAD (coronary artery disease)    a. s/p DES to mid-LAD and DES to mid-OM1 in 11/2016  . Cervical disc disorder with myelopathy, unspecified cervical region   . Cervicalgia 01/19/2009   Qualifier: Diagnosis of  By: Aline Brochure MD, Dorothyann Peng    . Chest pain at rest 01/27/2015  . Chronic systolic (congestive) heart failure ([place])   . COPD (chronic obstructive pulmonary disease) ([place])   . Coronary artery disease   . De Quervain's disease (tenosynovitis) 04/02/2011  . Disc disease with myelopathy, cervical   . Essential hypertension   . Hemorrhoids   . Liver mass   . Lung, cysts, congenital    Left lung cyst  . Myocardial infarction ([place])   . Nephrolithiasis    Embedded  . Nonischemic cardiomyopathy ([place])    LVEF 35-40% 2015  . On home O2   . Osteoarthritis   . Sprain of wrist 08/07/2012  . Thoracic ascending aortic aneurysm ([facility])    4.3 cm April 2016    Past Surgical History:  Procedure Laterality Date  . Benign breast tumors    . CHOLECYSTECTOMY    . COLONOSCOPY    . COLONOSCOPY N/A 09/22/2014   Procedure: COLONOSCOPY;  Surgeon: Rogene Houston, MD;  [facility];  Service: Endoscopy;  Laterality: N/A;  830 -- to be done in OR under fluoro  . Complete hysterectomy    . CORONARY STENT INTERVENTION N/A 11/23/2016   Procedure: Coronary Stent Intervention;  Surgeon: Martinique, Peter M, MD;  Location: [facility];  Service: Cardiovascular;  Laterality: N/A;  . LEFT HEART CATH AND CORONARY ANGIOGRAPHY N/A 11/23/2016   Procedure: Left Heart Cath and Coronary Angiography;  Surgeon: Martinique, Peter M, MD;  Location: [facility];  Service: Cardiovascular;  Laterality: N/A;  . TONSILLECTOMY AND ADENOIDECTOMY      Family History  Problem Relation Age of Onset  . Heart disease Mother   . Aneurysm Father   . Lung cancer Brother   . Heart disease Sister   . Diabetes Brother   . Heart disease Brother     Social History   Socioeconomic History  . Marital status: Widowed    Spouse name: Not on file  . Number of children: Not on file  . Years of education: 9th  . Highest education level: Not on file  Occupational History    Employer: RETIRED  Tobacco Use  . Smoking status: Former Smoker    Packs/day: 0.25  Years: 3.00    Pack years: 0.75    Types: Cigarettes    Start date: 05/14/1977    Quit date: 05/28/2008    Years since quitting: 12.0  . Smokeless tobacco: Never Used  . Tobacco comment: patient states she only smoked for 3 years total  Vaping Use  . Vaping Use: Never used  Substance and Sexual Activity  . Alcohol use: No    Alcohol/week: 0.0 standard drinks  . Drug use: No  . Sexual activity: Never  Other Topics Concern  . Not on file  Social History Narrative  . Not on file   Social Determinants of Health   Financial Resource Strain: Not on file  Food Insecurity: Not on file  Transportation Needs: Not on file  Physical Activity: Not on file  Stress: Not on file  Social Connections: Not on file  Intimate Partner Violence: Not on file    Outpatient Medications Prior to Visit  Medication Sig Dispense Refill   . albuterol (PROVENTIL) (2.5 MG/3ML) 0.083% nebulizer solution Take 3 mLs (2.5 mg total) by nebulization every 6 (six) hours as needed for wheezing or shortness of breath. 75 mL 12  . aspirin EC 81 MG tablet Take 81 mg by mouth every morning.     Marland Kitchen atorvastatin (LIPITOR) 80 MG tablet TAKE 1 TABLET BY MOUTH ONCE DAILY AT BEDTIME. 90 tablet 3  . clorazepate (TRANXENE) 7.5 MG tablet Take 1 tablet (7.5 mg total) by mouth daily as needed for anxiety. For nerves 30 tablet 0  . ENTRESTO 24-26 MG TAKE 1 TABLET BY MOUTH TWICE DAILY. 60 tablet 6  . ezetimibe (ZETIA) 10 MG tablet Take 1 tablet (10 mg total) by mouth daily. 90 tablet 3  . fluticasone (FLONASE) 50 MCG/ACT nasal spray Place 1 spray into both nostrils daily. 16 g 2  . Fluticasone-Umeclidin-Vilant (TRELEGY ELLIPTA) 200-62.5-25 MCG/INH AEPB Inhale 1 puff into the lungs daily. 60 each 0  . ibuprofen (ADVIL) 800 MG tablet Take 800 mg by mouth 3 (three) times daily.    Marland Kitchen levocetirizine (XYZAL) 5 MG tablet Take 1 tablet (5 mg total) by mouth every evening. 30 tablet 1  . meclizine (ANTIVERT) 25 MG tablet Take 25 mg by mouth as needed for dizziness.    . meloxicam (MOBIC) 7.5 MG tablet Take 7.5 mg by mouth daily.     . montelukast (SINGULAIR) 10 MG tablet Take 1 tablet (10 mg total) by mouth at bedtime. 30 tablet 5  . nitroGLYCERIN (NITROSTAT) 0.4 MG SL tablet Place 1 tablet (0.4 mg total) under the tongue every 5 (five) minutes x 3 doses as needed for chest pain. 25 tablet 3  . ondansetron (ZOFRAN ODT) 4 MG disintegrating tablet Take 1 tablet (4 mg total) by mouth every 8 (eight) hours as needed for nausea. 10 tablet 0  . OXYGEN Inhale 2-2.5 L into the lungs at bedtime.     . sulfamethoxazole-trimethoprim (BACTRIM DS) 800-160 MG tablet Take 1 tablet by mouth 2 (two) times daily. 10 tablet 5  . traMADol (ULTRAM) 50 MG tablet Take 50 mg by mouth daily as needed for moderate pain or severe pain. Maximum dose= 8 tablets per day    . TRELEGY ELLIPTA  200-62.5-25 MCG/INH AEPB INHALE (1) PUFF BY MOUTH INTO THE LUNGS DAILY. (Patient taking differently: Take 1 puff by mouth.) 60 each 5  . VENTOLIN HFA 108 (90 Base) MCG/ACT inhaler INHALE 2 PUFFS INTO THE LUNGS FOUR TIMES DAILY. 18 g PRN  . carvedilol (COREG)  12.5 MG tablet Take 1 tablet (12.5 mg total) by mouth 2 (two) times daily. 180 tablet 3   No facility-administered medications prior to visit.    Allergies  Allergen Reactions  . Plavix [Clopidogrel Bisulfate] Itching    Severe itching  . Alprazolam Nausea And Vomiting  . Codeine Nausea And Vomiting  . Percodan [Oxycodone-Aspirin] Nausea And Vomiting  . Valium Nausea And Vomiting    Review of Systems  Constitutional: Negative.   Respiratory: Negative.   Cardiovascular: Negative.   Neurological: Positive for facial asymmetry and numbness. Negative for dizziness, syncope, speech difficulty and weakness.       Objective:    Physical Exam Constitutional:      Appearance: Normal appearance.  Eyes:     Extraocular Movements: Extraocular movements intact.     Conjunctiva/sclera: Conjunctivae normal.     Pupils: Pupils are equal, round, and reactive to light.  Cardiovascular:     Rate and Rhythm: Normal rate and regular rhythm.     Pulses: Normal pulses.     Heart sounds: Normal heart sounds.     Comments: Murmur present 3/6 Pulmonary:     Effort: Pulmonary effort is normal.     Breath sounds: Normal breath sounds.  Chest:  Breasts:     Left: Mass present. No swelling, bleeding, inverted nipple, nipple discharge, skin change, tenderness, axillary adenopathy or supraclavicular adenopathy.     Abdominal:     Comments: LUQ tenderness on palpation  Lymphadenopathy:     Upper Body:     Left upper body: No supraclavicular, axillary or pectoral adenopathy.  Neurological:     Mental Status: She is alert.     BP (!) 172/71   Pulse 80   Temp 98 F (36.7 C)   Resp (!) 22   Ht 5\' 2"  (1.575 m)   Wt 166 lb (75.3 kg)    SpO2 93%   BMI 30.36 kg/m  Wt Readings from Last 3 Encounters:  05/27/20 166 lb (75.3 kg)  05/24/20 165 lb (74.8 kg)  04/20/20 160 lb (72.6 kg)    Health Maintenance Due  Topic Date Due  . URINE MICROALBUMIN  Never done  . TETANUS/TDAP  Never done  . PNA vac Low Risk Adult (2 of 2 - PCV13) 07/11/2016    There are no preventive care reminders to display for this patient.   No results found for: TSH Lab Results  Component Value Date   WBC 6.1 05/25/2020   HGB 11.3 05/25/2020   HCT 35.3 05/25/2020   MCV 80 05/25/2020   PLT 298 05/25/2020   Lab Results  Component Value Date   NA 145 (H) 05/25/2020   K 4.5 05/25/2020   CO2 26 05/25/2020   GLUCOSE 97 05/25/2020   BUN 12 05/25/2020   CREATININE 0.71 05/25/2020   BILITOT 0.6 05/25/2020   ALKPHOS 73 05/25/2020   AST 20 05/25/2020   ALT 10 05/25/2020   PROT 6.2 05/25/2020   ALBUMIN 4.2 05/25/2020   CALCIUM 10.0 05/25/2020   ANIONGAP 8 04/20/2020   Lab Results  Component Value Date   CHOL 235 (H) 05/25/2020   Lab Results  Component Value Date   HDL 89 05/25/2020   Lab Results  Component Value Date   LDLCALC 136 (H) 05/25/2020   Lab Results  Component Value Date   TRIG 58 05/25/2020   Lab Results  Component Value Date   CHOLHDL 2.2 12/19/2018   No results found for: HGBA1C  Assessment & Plan:   Problem List Items Addressed This Visit      Cardiovascular and Mediastinum   Stroke [facility])    -likely occurred 3 weeks ago -she has slight left facial droop -states she is staggering now  -refer to neuro as well as PT/OT -she has hx of meningioma from 2017, but also has current breast mass, so will get MRI brain to r/o enlarging meningioma or breast CA mets to brain      Relevant Orders   MR Brain W Wo Contrast   Ambulatory referral to Neurology   Ambulatory referral to Physical Therapy   Ambulatory referral to Occupational Therapy     Other   Left breast mass    -ordered diagnostic mammogram  today -at 7 o'clock; approximately 2 inches by half an inch      Relevant Orders   MM Digital Diagnostic Unilat L    Other Visit Diagnoses    Unspecified lump in the left breast, lower inner quadrant        Relevant Orders   MM Digital Diagnostic Unilat L       No orders of the defined types were placed in this encounter.    [name], NP

## 2020-05-27 NOTE — Patient Instructions (Signed)
We will meet back up in 2 weeks to discuss your chest pain.  I have referred you to neurology as well as ordered a MRI of your brain. I got you a referral to PT/OT because you states that you are staggering more.  For your left breast mass, I ordered a mammogram of your left breast.

## 2020-05-27 NOTE — Assessment & Plan Note (Signed)
-  likely occurred 3 weeks ago -she has slight left facial droop -states she is staggering now  -refer to neuro as well as PT/OT -she has hx of meningioma from 2017, but also has current breast mass, so will get MRI brain to r/o enlarging meningioma or breast CA mets to brain

## 2020-06-07 ENCOUNTER — Telehealth (HOSPITAL_COMMUNITY): Payer: Self-pay

## 2020-06-07 NOTE — Telephone Encounter (Signed)
Gae Dry called stating her mom doesn't want to do any PT or OT -she doesn't think it will help. Cx and close referral

## 2020-06-08 ENCOUNTER — Ambulatory Visit (HOSPITAL_COMMUNITY): Payer: Medicare Other | Admitting: Physical Therapy

## 2020-06-08 ENCOUNTER — Ambulatory Visit (HOSPITAL_COMMUNITY): Payer: Medicare Other

## 2020-06-10 ENCOUNTER — Ambulatory Visit (INDEPENDENT_AMBULATORY_CARE_PROVIDER_SITE_OTHER): Payer: Medicare Other | Admitting: Nurse Practitioner

## 2020-06-10 ENCOUNTER — Encounter: Payer: Self-pay | Admitting: Nurse Practitioner

## 2020-06-10 ENCOUNTER — Other Ambulatory Visit: Payer: Self-pay

## 2020-06-10 VITALS — BP 177/68 | HR 72 | Temp 98.6°F | Resp 20 | Ht 62.0 in | Wt 161.0 lb

## 2020-06-10 DIAGNOSIS — R5381 Other malaise: Secondary | ICD-10-CM

## 2020-06-10 DIAGNOSIS — N6320 Unspecified lump in the left breast, unspecified quadrant: Secondary | ICD-10-CM

## 2020-06-10 DIAGNOSIS — N6324 Unspecified lump in the left breast, lower inner quadrant: Secondary | ICD-10-CM | POA: Diagnosis not present

## 2020-06-10 DIAGNOSIS — I639 Cerebral infarction, unspecified: Secondary | ICD-10-CM

## 2020-06-10 NOTE — Assessment & Plan Note (Signed)
-  she declined PT/OT after CVA despite some weakness -she lives alone and is not interested in assisted living

## 2020-06-10 NOTE — Patient Instructions (Signed)
We will meet back up after imaging has been completed on your brain and breast.

## 2020-06-10 NOTE — Assessment & Plan Note (Signed)
-  likely occurred 3 weeks ago -she has slight left facial droop -states she is staggering now  -was referred to neuro as well as PT/OT, but she declined this when they called -she has hx of meningioma from 2017, but also has current breast mass, so will get MRI brain to r/o enlarging meningioma or breast CA mets to brain

## 2020-06-10 NOTE — Assessment & Plan Note (Signed)
-  ordered mammogram and u/s of left breast

## 2020-06-10 NOTE — Progress Notes (Signed)
Established Patient Office Visit  Subjective:  Patient ID: [name], female    DOB: [birth year]  Age: 85 y.o. MRN: [id number]  CC:  Chief Complaint  Patient presents with  . Breast Mass    Left breast pain.     HPI [name] presents for follow-up for left breast mass and stroke. She has hx of meningioma. She states that Dr. Ellene [name] recommended going to an assisted living facility and that she was not a surgical candidate.  She states that she has pain all over and is worse in her knees and hips.  She has not fallen lately.  She states that therapy reached out to her after her CVA, but she declines therapy at this time, and states that she can do exercises on her own.    Past Medical History:  Diagnosis Date  . Acute on chronic respiratory failure with hypoxia ([place]) 04/24/2018  . Anxiety   . Atypical chest pain   . Bronchospasm   . CAD (coronary artery disease)    a. s/p DES to mid-LAD and DES to mid-OM1 in 11/2016  . Cervical disc disorder with myelopathy, unspecified cervical region   . Cervicalgia 01/19/2009   Qualifier: Diagnosis of  By: Aline Brochure MD, Dorothyann Peng    . Chest pain at rest 01/27/2015  . Chronic systolic (congestive) heart failure ([place])   . COPD (chronic obstructive pulmonary disease) ([place])   . Coronary artery disease   . De Quervain's disease (tenosynovitis) 04/02/2011  . Disc disease with myelopathy, cervical   . Essential hypertension   . Hemorrhoids   . Liver mass   . Lung, cysts, congenital    Left lung cyst  . Myocardial infarction ([place])   . Nephrolithiasis    Embedded  . Nonischemic cardiomyopathy ([place])    LVEF 35-40% 2015  . On home O2   . Osteoarthritis   . Sprain of wrist 08/07/2012  . Thoracic ascending aortic aneurysm ([facility])    4.3 cm April 2016    Past Surgical History:  Procedure Laterality Date  . Benign breast tumors    . CHOLECYSTECTOMY    . COLONOSCOPY    . COLONOSCOPY N/A 09/22/2014   Procedure: COLONOSCOPY;  Surgeon:  Rogene Houston, MD;  [facility];  Service: Endoscopy;  Laterality: N/A;  830 -- to be done in OR under fluoro  . Complete hysterectomy    . CORONARY STENT INTERVENTION N/A 11/23/2016   Procedure: Coronary Stent Intervention;  Surgeon: Martinique, Peter M, MD;  Location: [facility];  Service: Cardiovascular;  Laterality: N/A;  . LEFT HEART CATH AND CORONARY ANGIOGRAPHY N/A 11/23/2016   Procedure: Left Heart Cath and Coronary Angiography;  Surgeon: Martinique, Peter M, MD;  Location: [facility];  Service: Cardiovascular;  Laterality: N/A;  . TONSILLECTOMY AND ADENOIDECTOMY      Family History  Problem Relation Age of Onset  . Heart disease Mother   . Aneurysm Father   . Lung cancer Brother   . Heart disease Sister   . Diabetes Brother   . Heart disease Brother     Social History   Socioeconomic History  . Marital status: Widowed    Spouse name: Not on file  . Number of children: Not on file  . Years of education: 9th  . Highest education level: Not on file  Occupational History    Employer: RETIRED  Tobacco Use  . Smoking status: Former Smoker    Packs/day: 0.25  Years: 3.00    Pack years: 0.75    Types: Cigarettes    Start date: 05/14/1977    Quit date: 05/28/2008    Years since quitting: 12.0  . Smokeless tobacco: Never Used  . Tobacco comment: patient states she only smoked for 3 years total  Vaping Use  . Vaping Use: Never used  Substance and Sexual Activity  . Alcohol use: No    Alcohol/week: 0.0 standard drinks  . Drug use: No  . Sexual activity: Never  Other Topics Concern  . Not on file  Social History Narrative  . Not on file   Social Determinants of Health   Financial Resource Strain: Not on file  Food Insecurity: Not on file  Transportation Needs: Not on file  Physical Activity: Not on file  Stress: Not on file  Social Connections: Not on file  Intimate Partner Violence: Not on file    Outpatient Medications Prior to Visit   Medication Sig Dispense Refill  . albuterol (PROVENTIL) (2.5 MG/3ML) 0.083% nebulizer solution Take 3 mLs (2.5 mg total) by nebulization every 6 (six) hours as needed for wheezing or shortness of breath. 75 mL 12  . aspirin EC 81 MG tablet Take 81 mg by mouth every morning.     Marland Kitchen atorvastatin (LIPITOR) 80 MG tablet TAKE 1 TABLET BY MOUTH ONCE DAILY AT BEDTIME. 90 tablet 3  . clorazepate (TRANXENE) 7.5 MG tablet Take 1 tablet (7.5 mg total) by mouth daily as needed for anxiety. For nerves 30 tablet 0  . ENTRESTO 24-26 MG TAKE 1 TABLET BY MOUTH TWICE DAILY. 60 tablet 6  . ezetimibe (ZETIA) 10 MG tablet Take 1 tablet (10 mg total) by mouth daily. 90 tablet 3  . fluticasone (FLONASE) 50 MCG/ACT nasal spray Place 1 spray into both nostrils daily. 16 g 2  . Fluticasone-Umeclidin-Vilant (TRELEGY ELLIPTA) 200-62.5-25 MCG/INH AEPB Inhale 1 puff into the lungs daily. 60 each 0  . ibuprofen (ADVIL) 800 MG tablet Take 800 mg by mouth 3 (three) times daily.    Marland Kitchen levocetirizine (XYZAL) 5 MG tablet Take 1 tablet (5 mg total) by mouth every evening. 30 tablet 1  . meclizine (ANTIVERT) 25 MG tablet Take 25 mg by mouth as needed for dizziness.    . meloxicam (MOBIC) 7.5 MG tablet Take 7.5 mg by mouth daily.     . montelukast (SINGULAIR) 10 MG tablet Take 1 tablet (10 mg total) by mouth at bedtime. 30 tablet 5  . nitroGLYCERIN (NITROSTAT) 0.4 MG SL tablet Place 1 tablet (0.4 mg total) under the tongue every 5 (five) minutes x 3 doses as needed for chest pain. 25 tablet 3  . ondansetron (ZOFRAN ODT) 4 MG disintegrating tablet Take 1 tablet (4 mg total) by mouth every 8 (eight) hours as needed for nausea. 10 tablet 0  . OXYGEN Inhale 2-2.5 L into the lungs at bedtime.     . sulfamethoxazole-trimethoprim (BACTRIM DS) 800-160 MG tablet Take 1 tablet by mouth 2 (two) times daily. 10 tablet 5  . traMADol (ULTRAM) 50 MG tablet Take 50 mg by mouth daily as needed for moderate pain or severe pain. Maximum dose= 8 tablets  per day    . TRELEGY ELLIPTA 200-62.5-25 MCG/INH AEPB INHALE (1) PUFF BY MOUTH INTO THE LUNGS DAILY. (Patient taking differently: Take 1 puff by mouth.) 60 each 5  . VENTOLIN HFA 108 (90 Base) MCG/ACT inhaler INHALE 2 PUFFS INTO THE LUNGS FOUR TIMES DAILY. 18 g PRN  . carvedilol (COREG)  12.5 MG tablet Take 1 tablet (12.5 mg total) by mouth 2 (two) times daily. 180 tablet 3   No facility-administered medications prior to visit.    Allergies  Allergen Reactions  . Plavix [Clopidogrel Bisulfate] Itching    Severe itching  . Alprazolam Nausea And Vomiting  . Codeine Nausea And Vomiting  . Percodan [Oxycodone-Aspirin] Nausea And Vomiting  . Valium Nausea And Vomiting    ROS Review of Systems  Constitutional: Negative.   Respiratory: Negative.   Cardiovascular: Negative.   Musculoskeletal: Positive for arthralgias and gait problem.       Uses walker for ambulation.      Objective:    Physical Exam Constitutional:      Appearance: Normal appearance.  Cardiovascular:     Rate and Rhythm: Normal rate and regular rhythm.     Pulses: Normal pulses.     Heart sounds: Murmur heard.    Pulmonary:     Effort: Pulmonary effort is normal.     Breath sounds: Normal breath sounds.  Musculoskeletal:     Comments: Uses walker for ambulation  Neurological:     Mental Status: She is alert.  Psychiatric:        Mood and Affect: Mood normal.        Behavior: Behavior normal.     BP (!) 177/68   Pulse 72   Temp 98.6 F (37 C)   Resp 20   Ht 5\' 2"  (1.575 m)   Wt 161 lb (73 kg)   SpO2 95%   BMI 29.45 kg/m  Wt Readings from Last 3 Encounters:  06/10/20 161 lb (73 kg)  05/27/20 166 lb (75.3 kg)  05/24/20 165 lb (74.8 kg)     Health Maintenance Due  Topic Date Due  . COVID-19 Vaccine (1) Never done  . URINE MICROALBUMIN  Never done  . TETANUS/TDAP  Never done  . PNA vac Low Risk Adult (2 of 2 - PCV13) 07/11/2016    There are no preventive care reminders to display for  this patient.  No results found for: TSH Lab Results  Component Value Date   WBC 6.1 05/25/2020   HGB 11.3 05/25/2020   HCT 35.3 05/25/2020   MCV 80 05/25/2020   PLT 298 05/25/2020   Lab Results  Component Value Date   NA 145 (H) 05/25/2020   K 4.5 05/25/2020   CO2 26 05/25/2020   GLUCOSE 97 05/25/2020   BUN 12 05/25/2020   CREATININE 0.71 05/25/2020   BILITOT 0.6 05/25/2020   ALKPHOS 73 05/25/2020   AST 20 05/25/2020   ALT 10 05/25/2020   PROT 6.2 05/25/2020   ALBUMIN 4.2 05/25/2020   CALCIUM 10.0 05/25/2020   ANIONGAP 8 04/20/2020   Lab Results  Component Value Date   CHOL 235 (H) 05/25/2020   Lab Results  Component Value Date   HDL 89 05/25/2020   Lab Results  Component Value Date   LDLCALC 136 (H) 05/25/2020   Lab Results  Component Value Date   TRIG 58 05/25/2020   Lab Results  Component Value Date   CHOLHDL 2.2 12/19/2018   No results found for: HGBA1C    Assessment & Plan:   Problem List Items Addressed This Visit      Cardiovascular and Mediastinum   Stroke [facility])    -likely occurred 3 weeks ago -she has slight left facial droop -states she is staggering now  -was referred to neuro as well as PT/OT, but she declined this  when they called -she has hx of meningioma from 2017, but also has current breast mass, so will get MRI brain to r/o enlarging meningioma or breast CA mets to brain        Other   Physical deconditioning    -she declined PT/OT after CVA despite some weakness -she lives alone and is not interested in assisted living      Left breast mass - Primary    -ordered mammogram and u/s of left breast      Relevant Orders   MM Digital Diagnostic Unilat L   US BREAST COMPLETE UNI LEFT INC AXILLA    Other Visit Diagnoses    Unspecified lump in the left breast, lower inner quadrant        Relevant Orders   MM Digital Diagnostic Unilat L      No orders of the defined types were placed in this encounter.   Follow-up:  Return in about 2 weeks (around 06/24/2020) for Follow-up after imaging and [address].  Time spent: 33 minutes  [name], NP

## 2020-06-13 ENCOUNTER — Other Ambulatory Visit: Payer: Self-pay

## 2020-06-13 ENCOUNTER — Ambulatory Visit (HOSPITAL_COMMUNITY)
Admission: RE | Admit: 2020-06-13 | Discharge: 2020-06-13 | Disposition: A | Discharge status: Home / Self Care | Payer: Medicare Other | Source: Ambulatory Visit | Attending: Nurse Practitioner | Admitting: Nurse Practitioner

## 2020-06-13 DIAGNOSIS — R200 Anesthesia of skin: Secondary | ICD-10-CM | POA: Diagnosis not present

## 2020-06-13 DIAGNOSIS — I639 Cerebral infarction, unspecified: Secondary | ICD-10-CM | POA: Insufficient documentation

## 2020-06-13 DIAGNOSIS — D329 Benign neoplasm of meninges, unspecified: Secondary | ICD-10-CM | POA: Diagnosis not present

## 2020-06-13 DIAGNOSIS — J341 Cyst and mucocele of nose and nasal sinus: Secondary | ICD-10-CM | POA: Diagnosis not present

## 2020-06-13 DIAGNOSIS — R262 Difficulty in walking, not elsewhere classified: Secondary | ICD-10-CM | POA: Diagnosis not present

## 2020-06-13 MED ORDER — GADOBUTROL 1 MMOL/ML IV SOLN
7.0000 mL | Freq: Once | INTRAVENOUS | Status: AC | PRN
  Administered 2020-06-13: 7 mL via INTRAVENOUS

## 2020-06-13 NOTE — Progress Notes (Signed)
Good news. The MRI said that the meningioma was unchanged, so it is not any bigger. We ill meet back up in about a month.

## 2020-06-14 ENCOUNTER — Telehealth: Payer: Self-pay

## 2020-06-14 ENCOUNTER — Other Ambulatory Visit: Payer: Self-pay | Admitting: Nurse Practitioner

## 2020-06-14 DIAGNOSIS — F419 Anxiety disorder, unspecified: Secondary | ICD-10-CM

## 2020-06-14 MED ORDER — TRAMADOL HCL 50 MG PO TABS: 50.0000 mg | ORAL_TABLET | Freq: Every day | ORAL | 2 refills | Status: DC | PRN

## 2020-06-14 MED ORDER — CLORAZEPATE DIPOTASSIUM 7.5 MG PO TABS: 7.5000 mg | ORAL_TABLET | Freq: Every day | ORAL | 2 refills | Status: DC | PRN

## 2020-06-14 NOTE — Telephone Encounter (Signed)
sent 

## 2020-06-14 NOTE — Telephone Encounter (Signed)
Pt states she needs a refill on her pain medication and anxiety medication.

## 2020-06-28 ENCOUNTER — Other Ambulatory Visit: Payer: Self-pay

## 2020-06-28 ENCOUNTER — Ambulatory Visit (HOSPITAL_COMMUNITY)
Admission: RE | Admit: 2020-06-28 | Discharge: 2020-06-28 | Disposition: A | Discharge status: Home / Self Care | Payer: Medicare Other | Source: Ambulatory Visit | Attending: Nurse Practitioner | Admitting: Nurse Practitioner

## 2020-06-28 ENCOUNTER — Other Ambulatory Visit: Payer: Self-pay | Admitting: Nurse Practitioner

## 2020-06-28 DIAGNOSIS — N6320 Unspecified lump in the left breast, unspecified quadrant: Secondary | ICD-10-CM | POA: Insufficient documentation

## 2020-06-28 DIAGNOSIS — N644 Mastodynia: Secondary | ICD-10-CM | POA: Diagnosis not present

## 2020-06-28 DIAGNOSIS — N6324 Unspecified lump in the left breast, lower inner quadrant: Secondary | ICD-10-CM | POA: Insufficient documentation

## 2020-06-28 DIAGNOSIS — R922 Inconclusive mammogram: Secondary | ICD-10-CM | POA: Diagnosis not present

## 2020-07-20 ENCOUNTER — Encounter: Payer: Self-pay | Admitting: Nurse Practitioner

## 2020-07-20 ENCOUNTER — Ambulatory Visit (INDEPENDENT_AMBULATORY_CARE_PROVIDER_SITE_OTHER): Payer: Medicare Other | Admitting: Nurse Practitioner

## 2020-07-20 ENCOUNTER — Other Ambulatory Visit: Payer: Self-pay

## 2020-07-20 VITALS — BP 129/67 | HR 69 | Temp 97.7°F | Resp 18 | Ht 62.0 in | Wt 166.0 lb

## 2020-07-20 DIAGNOSIS — R300 Dysuria: Secondary | ICD-10-CM

## 2020-07-20 DIAGNOSIS — D329 Benign neoplasm of meninges, unspecified: Secondary | ICD-10-CM | POA: Diagnosis not present

## 2020-07-20 DIAGNOSIS — R5381 Other malaise: Secondary | ICD-10-CM | POA: Diagnosis not present

## 2020-07-20 DIAGNOSIS — I2510 Atherosclerotic heart disease of native coronary artery without angina pectoris: Secondary | ICD-10-CM

## 2020-07-20 DIAGNOSIS — I639 Cerebral infarction, unspecified: Secondary | ICD-10-CM

## 2020-07-20 DIAGNOSIS — N6320 Unspecified lump in the left breast, unspecified quadrant: Secondary | ICD-10-CM | POA: Diagnosis not present

## 2020-07-20 MED ORDER — NITROGLYCERIN 0.4 MG SL SUBL: 0.4000 mg | SUBLINGUAL_TABLET | SUBLINGUAL | 3 refills | Status: DC | PRN

## 2020-07-20 NOTE — Progress Notes (Signed)
Established Patient Office Visit  Subjective:  Patient ID: [name], female    DOB: [birth year]  Age: 85 y.o. MRN: [id number]  CC:  Chief Complaint  Patient presents with  . Follow-up    Med check; anxiety   . Dysuria    HPI [name] presents for follow-up for breast mass and CVA.  At her last appointment she stated that that Dr. Ellene Route recommended going to an assisted living facility and that she was not a surgical candidate.  She states that she has pain all over and is worse in her knees and hips.  She has not fallen lately.  She states that therapy reached out to her after her CVA, but she declines therapy at this time, and states that she can do exercises on her own.  She states that she has some abdominal pain when she urinates. She states she is incontinent of bladder.  She has occasional dysuria, and she states her abdomen is painful when she presses on it.   Past Medical History:  Diagnosis Date  . Acute on chronic respiratory failure with hypoxia ([place]) 04/24/2018  . Anxiety   . Atypical chest pain   . Bronchospasm   . CAD (coronary artery disease)    a. s/p DES to mid-LAD and DES to mid-OM1 in 11/2016  . Cervical disc disorder with myelopathy, unspecified cervical region   . Cervicalgia 01/19/2009   Qualifier: Diagnosis of  By: Aline Brochure MD, Dorothyann Peng    . Chest pain at rest 01/27/2015  . Chronic systolic (congestive) heart failure ([place])   . COPD (chronic obstructive pulmonary disease) ([place])   . Coronary artery disease   . De Quervain's disease (tenosynovitis) 04/02/2011  . Disc disease with myelopathy, cervical   . Essential hypertension   . Hemorrhoids   . Liver mass   . Lung, cysts, congenital    Left lung cyst  . Myocardial infarction ([place])   . Nephrolithiasis    Embedded  . Nonischemic cardiomyopathy ([place])    LVEF 35-40% 2015  . On home O2   . Osteoarthritis   . Sprain of wrist 08/07/2012  . Thoracic ascending aortic aneurysm ([facility])    4.3 cm  April 2016    Past Surgical History:  Procedure Laterality Date  . Benign breast tumors    . CHOLECYSTECTOMY    . COLONOSCOPY    . COLONOSCOPY N/A 09/22/2014   Procedure: COLONOSCOPY;  Surgeon: Rogene Houston, MD;  [facility];  Service: Endoscopy;  Laterality: N/A;  830 -- to be done in OR under fluoro  . Complete hysterectomy    . CORONARY STENT INTERVENTION N/A 11/23/2016   Procedure: Coronary Stent Intervention;  Surgeon: Martinique, Peter M, MD;  Location: [facility];  Service: Cardiovascular;  Laterality: N/A;  . LEFT HEART CATH AND CORONARY ANGIOGRAPHY N/A 11/23/2016   Procedure: Left Heart Cath and Coronary Angiography;  Surgeon: Martinique, Peter M, MD;  Location: [facility];  Service: Cardiovascular;  Laterality: N/A;  . TONSILLECTOMY AND ADENOIDECTOMY      Family History  Problem Relation Age of Onset  . Heart disease Mother   . Aneurysm Father   . Lung cancer Brother   . Heart disease Sister   . Diabetes Brother   . Heart disease Brother     Social History   Socioeconomic History  . Marital status: Widowed    Spouse name: Not on file  . Number of children: Not on  file  . Years of education: 9th  . Highest education level: Not on file  Occupational History    Employer: RETIRED  Tobacco Use  . Smoking status: Former Smoker    Packs/day: 0.25    Years: 3.00    Pack years: 0.75    Types: Cigarettes    Start date: 05/14/1977    Quit date: 05/28/2008    Years since quitting: 12.1  . Smokeless tobacco: Never Used  . Tobacco comment: patient states she only smoked for 3 years total  Vaping Use  . Vaping Use: Never used  Substance and Sexual Activity  . Alcohol use: No    Alcohol/week: 0.0 standard drinks  . Drug use: No  . Sexual activity: Never  Other Topics Concern  . Not on file  Social History Narrative  . Not on file   Social Determinants of Health   Financial Resource Strain: Not on file  Food Insecurity: Not on file   Transportation Needs: Not on file  Physical Activity: Not on file  Stress: Not on file  Social Connections: Not on file  Intimate Partner Violence: Not on file    Outpatient Medications Prior to Visit  Medication Sig Dispense Refill  . albuterol (PROVENTIL) (2.5 MG/3ML) 0.083% nebulizer solution Take 3 mLs (2.5 mg total) by nebulization every 6 (six) hours as needed for wheezing or shortness of breath. 75 mL 12  . aspirin EC 81 MG tablet Take 81 mg by mouth every morning.     Marland Kitchen atorvastatin (LIPITOR) 80 MG tablet TAKE 1 TABLET BY MOUTH ONCE DAILY AT BEDTIME. 90 tablet 3  . clorazepate (TRANXENE) 7.5 MG tablet Take 1 tablet (7.5 mg total) by mouth daily as needed for anxiety. For nerves 30 tablet 2  . ENTRESTO 24-26 MG TAKE 1 TABLET BY MOUTH TWICE DAILY. 60 tablet 6  . ezetimibe (ZETIA) 10 MG tablet Take 1 tablet (10 mg total) by mouth daily. 90 tablet 3  . fluticasone (FLONASE) 50 MCG/ACT nasal spray Place 1 spray into both nostrils daily. 16 g 2  . Fluticasone-Umeclidin-Vilant (TRELEGY ELLIPTA) 200-62.5-25 MCG/INH AEPB Inhale 1 puff into the lungs daily. 60 each 0  . ibuprofen (ADVIL) 800 MG tablet Take 800 mg by mouth 3 (three) times daily.    Marland Kitchen levocetirizine (XYZAL) 5 MG tablet Take 1 tablet (5 mg total) by mouth every evening. 30 tablet 1  . meclizine (ANTIVERT) 25 MG tablet Take 25 mg by mouth as needed for dizziness.    . meloxicam (MOBIC) 7.5 MG tablet Take 7.5 mg by mouth daily.     . montelukast (SINGULAIR) 10 MG tablet Take 1 tablet (10 mg total) by mouth at bedtime. 30 tablet 5  . ondansetron (ZOFRAN ODT) 4 MG disintegrating tablet Take 1 tablet (4 mg total) by mouth every 8 (eight) hours as needed for nausea. 10 tablet 0  . OXYGEN Inhale 2-2.5 L into the lungs at bedtime.     . traMADol (ULTRAM) 50 MG tablet Take 1 tablet (50 mg total) by mouth daily as needed for moderate pain or severe pain. Maximum dose= 8 tablets per day 30 tablet 2  . TRELEGY ELLIPTA 200-62.5-25 MCG/INH  AEPB INHALE (1) PUFF BY MOUTH INTO THE LUNGS DAILY. (Patient taking differently: Take 1 puff by mouth.) 60 each 5  . VENTOLIN HFA 108 (90 Base) MCG/ACT inhaler INHALE 2 PUFFS INTO THE LUNGS FOUR TIMES DAILY. 18 g PRN  . nitroGLYCERIN (NITROSTAT) 0.4 MG SL tablet Place 1 tablet (0.4  mg total) under the tongue every 5 (five) minutes x 3 doses as needed for chest pain. 25 tablet 3  . sulfamethoxazole-trimethoprim (BACTRIM DS) 800-160 MG tablet Take 1 tablet by mouth 2 (two) times daily. 10 tablet 5  . carvedilol (COREG) 12.5 MG tablet Take 1 tablet (12.5 mg total) by mouth 2 (two) times daily. 180 tablet 3   No facility-administered medications prior to visit.    Allergies  Allergen Reactions  . Plavix [Clopidogrel Bisulfate] Itching    Severe itching  . Alprazolam Nausea And Vomiting  . Codeine Nausea And Vomiting  . Percodan [Oxycodone-Aspirin] Nausea And Vomiting  . Valium Nausea And Vomiting    ROS Review of Systems  Constitutional: Negative.   Gastrointestinal: Positive for abdominal pain.       Below her navel  Genitourinary: Positive for dysuria. Negative for flank pain, hematuria and pelvic pain.  Musculoskeletal: Positive for arthralgias.      Objective:    Physical Exam Constitutional:      Appearance: Normal appearance.  Cardiovascular:     Rate and Rhythm: Normal rate and regular rhythm.     Pulses: Normal pulses.     Heart sounds: Normal heart sounds.  Pulmonary:     Effort: Pulmonary effort is normal.     Breath sounds: Normal breath sounds.  Abdominal:     General: Abdomen is flat. Bowel sounds are normal.     Palpations: Abdomen is soft.     Tenderness: There is abdominal tenderness.     Comments: Below her umbilicus  Musculoskeletal:     Comments: Uses walker for ambulation  Neurological:     Mental Status: She is alert.     BP 129/67   Pulse 69   Temp 97.7 F (36.5 C)   Resp 18   Ht '5\' 2"'  (1.575 m)   Wt 166 lb (75.3 kg)   SpO2 95%   BMI  30.36 kg/m  Wt Readings from Last 3 Encounters:  07/20/20 166 lb (75.3 kg)  06/10/20 161 lb (73 kg)  05/27/20 166 lb (75.3 kg)     Health Maintenance Due  Topic Date Due  . COVID-19 Vaccine (1) Never done  . TETANUS/TDAP  Never done  . PNA vac Low Risk Adult (2 of 2 - PCV13) 07/11/2016    There are no preventive care reminders to display for this patient.  No results found for: TSH Lab Results  Component Value Date   WBC 6.1 05/25/2020   HGB 11.3 05/25/2020   HCT 35.3 05/25/2020   MCV 80 05/25/2020   PLT 298 05/25/2020   Lab Results  Component Value Date   NA 145 (H) 05/25/2020   K 4.5 05/25/2020   CO2 26 05/25/2020   GLUCOSE 97 05/25/2020   BUN 12 05/25/2020   CREATININE 0.71 05/25/2020   BILITOT 0.6 05/25/2020   ALKPHOS 73 05/25/2020   AST 20 05/25/2020   ALT 10 05/25/2020   PROT 6.2 05/25/2020   ALBUMIN 4.2 05/25/2020   CALCIUM 10.0 05/25/2020   ANIONGAP 8 04/20/2020   Lab Results  Component Value Date   CHOL 235 (H) 05/25/2020   Lab Results  Component Value Date   HDL 89 05/25/2020   Lab Results  Component Value Date   LDLCALC 136 (H) 05/25/2020   Lab Results  Component Value Date   TRIG 58 05/25/2020   Lab Results  Component Value Date   CHOLHDL 2.2 12/19/2018   No results found for: HGBA1C  Assessment & Plan:   Problem List Items Addressed This Visit      Cardiovascular and Mediastinum   CAD (coronary artery disease) - Primary    -refilled Nitro per her request      Relevant Medications   nitroGLYCERIN (NITROSTAT) 0.4 MG SL tablet   Other Relevant Orders   CBC with Differential/Platelet   CMP14+EGFR   Lipid Panel With LDL/HDL Ratio   Stroke ([place])    -she is ambulating well and is living on her own; does not want assisted living      Relevant Medications   nitroGLYCERIN (NITROSTAT) 0.4 MG SL tablet   Other Relevant Orders   CBC with Differential/Platelet   CMP14+EGFR   Lipid Panel With LDL/HDL Ratio     Nervous and  Auditory   Meningioma ([facility])    -stable per MRI         Other   Physical deconditioning    -if this gets worse would consider palliative care consult -was going to complete MOST form today, but none available -her MRI and breast imaging were negative for malignancy, so there is no debilitating condition that would cause morbidity in the near-term      Left breast mass    -her mammogram was negative for malignancy        Dysuria    -UA today is negative for UTI -will send urine for culture      Relevant Orders   Urine Culture      Meds ordered this encounter  Medications  . nitroGLYCERIN (NITROSTAT) 0.4 MG SL tablet    Sig: Place 1 tablet (0.4 mg total) under the tongue every 5 (five) minutes x 3 doses as needed for chest pain.    Dispense:  25 tablet    Refill:  3    Follow-up: Return in about 3 months (around 10/20/2020) for Lab follow-up (HTN, HLD).    [name], NP

## 2020-07-20 NOTE — Assessment & Plan Note (Signed)
-  if this gets worse would consider palliative care consult -was going to complete MOST form today, but none available -her MRI and breast imaging were negative for malignancy, so there is no debilitating condition that would cause morbidity in the near-term

## 2020-07-20 NOTE — Assessment & Plan Note (Signed)
-  stable per MRI

## 2020-07-20 NOTE — Assessment & Plan Note (Signed)
-  refilled Nitro per her request

## 2020-07-20 NOTE — Patient Instructions (Signed)
We will meet back up in  3 months for a lab follow-up. Please have fasting labs drawn 2-3 days prior to the appointment.

## 2020-07-20 NOTE — Assessment & Plan Note (Signed)
-  she is ambulating well and is living on her own; does not want assisted living

## 2020-07-20 NOTE — Assessment & Plan Note (Signed)
-  UA today is negative for UTI -will send urine for culture

## 2020-07-20 NOTE — Assessment & Plan Note (Signed)
-  her mammogram was negative for malignancy

## 2020-07-21 ENCOUNTER — Other Ambulatory Visit: Payer: Self-pay | Admitting: Nurse Practitioner

## 2020-07-21 ENCOUNTER — Telehealth: Payer: Self-pay

## 2020-07-21 MED ORDER — NITROGLYCERIN 0.4 MG SL SUBL: 0.4000 mg | SUBLINGUAL_TABLET | SUBLINGUAL | 3 refills | Status: DC | PRN

## 2020-07-21 NOTE — Telephone Encounter (Signed)
Pt called and said she was missing medication that was suppose to be called in to [facility]

## 2020-07-21 NOTE — Telephone Encounter (Signed)
I sent in nitro. Is that what she is talking about? Her U/A was negative, so there was no antibiotic to be sent in.

## 2020-07-22 ENCOUNTER — Other Ambulatory Visit: Payer: Self-pay | Admitting: Nurse Practitioner

## 2020-07-22 DIAGNOSIS — F419 Anxiety disorder, unspecified: Secondary | ICD-10-CM

## 2020-07-22 MED ORDER — CLORAZEPATE DIPOTASSIUM 7.5 MG PO TABS
7.5000 mg | ORAL_TABLET | Freq: Every day | ORAL | 2 refills | Status: DC | PRN
Start: 2020-07-22 — End: 2020-10-11

## 2020-07-22 NOTE — Telephone Encounter (Signed)
Clorazepate and tramadol are needing refilled.

## 2020-07-22 NOTE — Telephone Encounter (Signed)
I sent in clorazepate, but I think tramadol still has a refill left on it.

## 2020-07-23 LAB — URINE CULTURE

## 2020-07-25 ENCOUNTER — Telehealth: Payer: Self-pay

## 2020-07-25 NOTE — Telephone Encounter (Signed)
Patient needing a refill on clonazepam sent to [facility] ph# [phone]

## 2020-07-25 NOTE — Telephone Encounter (Signed)
Pt informed

## 2020-07-25 NOTE — Telephone Encounter (Signed)
Patient called said it was this medicine.  clorazepate (TRANXENE) 7.5 MG tablet

## 2020-07-25 NOTE — Progress Notes (Signed)
No UTI on culture, so no need to add or change medicines.

## 2020-07-25 NOTE — Telephone Encounter (Signed)
Pt has not had clonazepam according to med list would need a virtual appt

## 2020-07-25 NOTE — Telephone Encounter (Signed)
This medication was sent on 07-22-20

## 2020-07-27 ENCOUNTER — Other Ambulatory Visit: Payer: Self-pay

## 2020-07-27 ENCOUNTER — Telehealth: Payer: Medicare Other | Admitting: Internal Medicine

## 2020-10-08 ENCOUNTER — Emergency Department (HOSPITAL_COMMUNITY): Payer: Medicare Other

## 2020-10-08 ENCOUNTER — Encounter (HOSPITAL_COMMUNITY): Payer: Self-pay

## 2020-10-08 ENCOUNTER — Other Ambulatory Visit: Payer: Self-pay

## 2020-10-08 ENCOUNTER — Inpatient Hospital Stay (HOSPITAL_COMMUNITY)
Admission: EM | Admit: 2020-10-08 | Discharge: 2020-10-11 | DRG: 690 | Disposition: A | Discharge status: Home Health | Payer: Medicare Other | Attending: Internal Medicine | Admitting: Internal Medicine

## 2020-10-08 DIAGNOSIS — R5383 Other fatigue: Secondary | ICD-10-CM | POA: Diagnosis not present

## 2020-10-08 DIAGNOSIS — Z885 Allergy status to narcotic agent status: Secondary | ICD-10-CM | POA: Diagnosis not present

## 2020-10-08 DIAGNOSIS — I5042 Chronic combined systolic (congestive) and diastolic (congestive) heart failure: Secondary | ICD-10-CM | POA: Diagnosis present

## 2020-10-08 DIAGNOSIS — R079 Chest pain, unspecified: Secondary | ICD-10-CM | POA: Diagnosis not present

## 2020-10-08 DIAGNOSIS — I712 Thoracic aortic aneurysm, without rupture: Secondary | ICD-10-CM | POA: Diagnosis present

## 2020-10-08 DIAGNOSIS — Z888 Allergy status to other drugs, medicaments and biological substances status: Secondary | ICD-10-CM | POA: Diagnosis not present

## 2020-10-08 DIAGNOSIS — I428 Other cardiomyopathies: Secondary | ICD-10-CM | POA: Diagnosis present

## 2020-10-08 DIAGNOSIS — R269 Unspecified abnormalities of gait and mobility: Secondary | ICD-10-CM | POA: Diagnosis present

## 2020-10-08 DIAGNOSIS — Z87891 Personal history of nicotine dependence: Secondary | ICD-10-CM

## 2020-10-08 DIAGNOSIS — N3000 Acute cystitis without hematuria: Secondary | ICD-10-CM

## 2020-10-08 DIAGNOSIS — J9611 Chronic respiratory failure with hypoxia: Secondary | ICD-10-CM | POA: Diagnosis present

## 2020-10-08 DIAGNOSIS — K219 Gastro-esophageal reflux disease without esophagitis: Secondary | ICD-10-CM | POA: Diagnosis present

## 2020-10-08 DIAGNOSIS — Z9981 Dependence on supplemental oxygen: Secondary | ICD-10-CM | POA: Diagnosis not present

## 2020-10-08 DIAGNOSIS — M1990 Unspecified osteoarthritis, unspecified site: Secondary | ICD-10-CM | POA: Diagnosis present

## 2020-10-08 DIAGNOSIS — D649 Anemia, unspecified: Secondary | ICD-10-CM | POA: Diagnosis present

## 2020-10-08 DIAGNOSIS — F419 Anxiety disorder, unspecified: Secondary | ICD-10-CM | POA: Diagnosis present

## 2020-10-08 DIAGNOSIS — Z833 Family history of diabetes mellitus: Secondary | ICD-10-CM

## 2020-10-08 DIAGNOSIS — R0602 Shortness of breath: Secondary | ICD-10-CM | POA: Diagnosis not present

## 2020-10-08 DIAGNOSIS — I252 Old myocardial infarction: Secondary | ICD-10-CM | POA: Diagnosis not present

## 2020-10-08 DIAGNOSIS — R509 Fever, unspecified: Secondary | ICD-10-CM | POA: Diagnosis not present

## 2020-10-08 DIAGNOSIS — I509 Heart failure, unspecified: Secondary | ICD-10-CM | POA: Diagnosis not present

## 2020-10-08 DIAGNOSIS — Z955 Presence of coronary angioplasty implant and graft: Secondary | ICD-10-CM

## 2020-10-08 DIAGNOSIS — Z9861 Coronary angioplasty status: Secondary | ICD-10-CM | POA: Diagnosis not present

## 2020-10-08 DIAGNOSIS — N390 Urinary tract infection, site not specified: Secondary | ICD-10-CM | POA: Diagnosis present

## 2020-10-08 DIAGNOSIS — Z79899 Other long term (current) drug therapy: Secondary | ICD-10-CM

## 2020-10-08 DIAGNOSIS — J449 Chronic obstructive pulmonary disease, unspecified: Secondary | ICD-10-CM | POA: Diagnosis present

## 2020-10-08 DIAGNOSIS — R531 Weakness: Secondary | ICD-10-CM | POA: Diagnosis not present

## 2020-10-08 DIAGNOSIS — Z8249 Family history of ischemic heart disease and other diseases of the circulatory system: Secondary | ICD-10-CM

## 2020-10-08 DIAGNOSIS — I10 Essential (primary) hypertension: Secondary | ICD-10-CM | POA: Diagnosis not present

## 2020-10-08 DIAGNOSIS — I2510 Atherosclerotic heart disease of native coronary artery without angina pectoris: Secondary | ICD-10-CM | POA: Diagnosis present

## 2020-10-08 DIAGNOSIS — Z87442 Personal history of urinary calculi: Secondary | ICD-10-CM | POA: Diagnosis not present

## 2020-10-08 DIAGNOSIS — R0789 Other chest pain: Secondary | ICD-10-CM | POA: Diagnosis not present

## 2020-10-08 DIAGNOSIS — I110 Hypertensive heart disease with heart failure: Secondary | ICD-10-CM | POA: Diagnosis present

## 2020-10-08 DIAGNOSIS — Z1611 Resistance to penicillins: Secondary | ICD-10-CM | POA: Diagnosis present

## 2020-10-08 DIAGNOSIS — G894 Chronic pain syndrome: Secondary | ICD-10-CM | POA: Diagnosis present

## 2020-10-08 DIAGNOSIS — D329 Benign neoplasm of meninges, unspecified: Secondary | ICD-10-CM | POA: Diagnosis present

## 2020-10-08 DIAGNOSIS — Z7982 Long term (current) use of aspirin: Secondary | ICD-10-CM

## 2020-10-08 DIAGNOSIS — I447 Left bundle-branch block, unspecified: Secondary | ICD-10-CM | POA: Diagnosis not present

## 2020-10-08 DIAGNOSIS — G8929 Other chronic pain: Secondary | ICD-10-CM | POA: Diagnosis present

## 2020-10-08 DIAGNOSIS — I639 Cerebral infarction, unspecified: Secondary | ICD-10-CM | POA: Diagnosis present

## 2020-10-08 DIAGNOSIS — J432 Centrilobular emphysema: Secondary | ICD-10-CM | POA: Diagnosis not present

## 2020-10-08 DIAGNOSIS — Z20822 Contact with and (suspected) exposure to covid-19: Secondary | ICD-10-CM | POA: Diagnosis present

## 2020-10-08 DIAGNOSIS — Z801 Family history of malignant neoplasm of trachea, bronchus and lung: Secondary | ICD-10-CM

## 2020-10-08 DIAGNOSIS — Z791 Long term (current) use of non-steroidal anti-inflammatories (NSAID): Secondary | ICD-10-CM

## 2020-10-08 DIAGNOSIS — Z7951 Long term (current) use of inhaled steroids: Secondary | ICD-10-CM

## 2020-10-08 DIAGNOSIS — B9620 Unspecified Escherichia coli [E. coli] as the cause of diseases classified elsewhere: Secondary | ICD-10-CM | POA: Diagnosis present

## 2020-10-08 LAB — CBC WITH DIFFERENTIAL/PLATELET
Abs Immature Granulocytes: 0.05 10*3/uL (ref 0.00–0.07)
Basophils Absolute: 0 10*3/uL (ref 0.0–0.1)
Basophils Relative: 1 %
Eosinophils Absolute: 0.1 10*3/uL (ref 0.0–0.5)
Eosinophils Relative: 2 %
HCT: 32.6 % — ABNORMAL LOW (ref 36.0–46.0)
Hemoglobin: 10.2 g/dL — ABNORMAL LOW (ref 12.0–15.0)
Immature Granulocytes: 1 %
Lymphocytes Relative: 23 %
Lymphs Abs: 1.7 10*3/uL (ref 0.7–4.0)
MCH: 25.6 pg — ABNORMAL LOW (ref 26.0–34.0)
MCHC: 31.3 g/dL (ref 30.0–36.0)
MCV: 81.9 fL (ref 80.0–100.0)
Monocytes Absolute: 1.3 10*3/uL — ABNORMAL HIGH (ref 0.1–1.0)
Monocytes Relative: 18 %
Neutro Abs: 4 10*3/uL (ref 1.7–7.7)
Neutrophils Relative %: 55 %
Platelets: 225 10*3/uL (ref 150–400)
RBC: 3.98 MIL/uL (ref 3.87–5.11)
RDW: 15.4 % (ref 11.5–15.5)
WBC: 7.2 10*3/uL (ref 4.0–10.5)
nRBC: 0 % (ref 0.0–0.2)

## 2020-10-08 LAB — COMPREHENSIVE METABOLIC PANEL
ALT: 12 U/L (ref 0–44)
AST: 14 U/L — ABNORMAL LOW (ref 15–41)
Albumin: 3.2 g/dL — ABNORMAL LOW (ref 3.5–5.0)
Alkaline Phosphatase: 67 U/L (ref 38–126)
Anion gap: 4 — ABNORMAL LOW (ref 5–15)
BUN: 13 mg/dL (ref 8–23)
CO2: 28 mmol/L (ref 22–32)
Calcium: 8.7 mg/dL — ABNORMAL LOW (ref 8.9–10.3)
Chloride: 102 mmol/L (ref 98–111)
Creatinine, Ser: 0.79 mg/dL (ref 0.44–1.00)
GFR, Estimated: >60 mL/min (ref >60)
Glucose, Bld: 105 mg/dL — ABNORMAL HIGH (ref 70–99)
Potassium: 4.1 mmol/L (ref 3.5–5.1)
Sodium: 134 mmol/L — ABNORMAL LOW (ref 135–145)
Total Bilirubin: 1 mg/dL (ref 0.3–1.2)
Total Protein: 6.1 g/dL — ABNORMAL LOW (ref 6.5–8.1)

## 2020-10-08 LAB — URINALYSIS, ROUTINE W REFLEX MICROSCOPIC
Bilirubin Urine: NEGATIVE
Glucose, UA: NEGATIVE mg/dL
Ketones, ur: 5 mg/dL — AB
Nitrite: POSITIVE — AB
Protein, ur: 30 mg/dL — AB
Specific Gravity, Urine: 1.005 (ref 1.005–1.030)
WBC, UA: >50 WBC/hpf — ABNORMAL HIGH (ref 0–5)
pH: 6 (ref 5.0–8.0)

## 2020-10-08 LAB — RESP PANEL BY RT-PCR (FLU A&B, COVID) ARPGX2
Influenza A by PCR: NEGATIVE
Influenza B by PCR: NEGATIVE
SARS Coronavirus 2 by RT PCR: NEGATIVE

## 2020-10-08 LAB — TROPONIN I (HIGH SENSITIVITY)
Troponin I (High Sensitivity): 9 ng/L (ref <18)
Troponin I (High Sensitivity): 9 ng/L (ref <18)

## 2020-10-08 MED ORDER — SODIUM CHLORIDE 0.9 % IV SOLN
1.0000 g | Freq: Once | INTRAVENOUS | Status: AC
  Administered 2020-10-08: 1 g via INTRAVENOUS
  Filled 2020-10-08: qty 10

## 2020-10-08 MED ORDER — ALBUTEROL SULFATE (2.5 MG/3ML) 0.083% IN NEBU: 2.5000 mg | INHALATION_SOLUTION | Freq: Four times a day (QID) | RESPIRATORY_TRACT | Status: DC | PRN

## 2020-10-08 MED ORDER — ACETAMINOPHEN 650 MG RE SUPP: 650.0000 mg | Freq: Four times a day (QID) | RECTAL | Status: DC | PRN

## 2020-10-08 MED ORDER — FLUTICASONE PROPIONATE 50 MCG/ACT NA SUSP
1.0000 | Freq: Every day | NASAL | Status: DC
  Administered 2020-10-11: 1 via NASAL

## 2020-10-08 MED ORDER — TRAZODONE HCL 50 MG PO TABS: 50.0000 mg | ORAL_TABLET | Freq: Every evening | ORAL | Status: DC | PRN

## 2020-10-08 MED ORDER — ATORVASTATIN CALCIUM 40 MG PO TABS
80.0000 mg | ORAL_TABLET | Freq: Every day | ORAL | Status: DC
  Administered 2020-10-08 – 2020-10-10 (×3): 80 mg via ORAL
  Filled 2020-10-08 (×3): qty 2

## 2020-10-08 MED ORDER — SACUBITRIL-VALSARTAN 24-26 MG PO TABS
1.0000 | ORAL_TABLET | Freq: Two times a day (BID) | ORAL | Status: DC
  Administered 2020-10-09 – 2020-10-11 (×5): 1 via ORAL
  Filled 2020-10-08 (×5): qty 1

## 2020-10-08 MED ORDER — ONDANSETRON HCL 4 MG/2ML IJ SOLN: 4.0000 mg | Freq: Four times a day (QID) | INTRAMUSCULAR | Status: DC | PRN

## 2020-10-08 MED ORDER — FLUTICASONE-UMECLIDIN-VILANT 200-62.5-25 MCG/INH IN AEPB: 1.0000 | INHALATION_SPRAY | Freq: Every day | RESPIRATORY_TRACT | Status: DC

## 2020-10-08 MED ORDER — LEFLUNOMIDE 20 MG PO TABS
20.0000 mg | ORAL_TABLET | Freq: Every day | ORAL | Status: DC
  Administered 2020-10-09 – 2020-10-11 (×3): 20 mg via ORAL
  Filled 2020-10-08 (×4): qty 1

## 2020-10-08 MED ORDER — BISACODYL 10 MG RE SUPP: 10.0000 mg | Freq: Every day | RECTAL | Status: DC | PRN

## 2020-10-08 MED ORDER — HEPARIN SODIUM (PORCINE) 5000 UNIT/ML IJ SOLN
5000.0000 [IU] | Freq: Three times a day (TID) | INTRAMUSCULAR | Status: DC
  Administered 2020-10-08 – 2020-10-11 (×8): 5000 [IU] via SUBCUTANEOUS
  Filled 2020-10-08 (×8): qty 1

## 2020-10-08 MED ORDER — LORATADINE 10 MG PO TABS
10.0000 mg | ORAL_TABLET | Freq: Every evening | ORAL | Status: DC
  Administered 2020-10-09 – 2020-10-10 (×2): 10 mg via ORAL
  Filled 2020-10-08 (×2): qty 1

## 2020-10-08 MED ORDER — FLUTICASONE FUROATE-VILANTEROL 100-25 MCG/INH IN AEPB
1.0000 | INHALATION_SPRAY | Freq: Every day | RESPIRATORY_TRACT | Status: DC
  Administered 2020-10-09: 1 via RESPIRATORY_TRACT
  Filled 2020-10-08: qty 28

## 2020-10-08 MED ORDER — SODIUM CHLORIDE 0.9% FLUSH: 3.0000 mL | INTRAVENOUS | Status: DC | PRN

## 2020-10-08 MED ORDER — TRAMADOL HCL 50 MG PO TABS
50.0000 mg | ORAL_TABLET | Freq: Every day | ORAL | Status: DC | PRN
Start: 2020-10-08 — End: 2020-10-11

## 2020-10-08 MED ORDER — MONTELUKAST SODIUM 10 MG PO TABS
10.0000 mg | ORAL_TABLET | Freq: Every day | ORAL | Status: DC
  Administered 2020-10-08 – 2020-10-10 (×3): 10 mg via ORAL
  Filled 2020-10-08 (×3): qty 1

## 2020-10-08 MED ORDER — SODIUM CHLORIDE 0.9 % IV SOLN: 250.0000 mL | INTRAVENOUS | Status: DC | PRN

## 2020-10-08 MED ORDER — ALBUTEROL SULFATE HFA 108 (90 BASE) MCG/ACT IN AERS: 2.0000 | INHALATION_SPRAY | RESPIRATORY_TRACT | Status: DC | PRN

## 2020-10-08 MED ORDER — SODIUM CHLORIDE 0.9% FLUSH
3.0000 mL | Freq: Two times a day (BID) | INTRAVENOUS | Status: DC
  Administered 2020-10-09 – 2020-10-11 (×5): 3 mL via INTRAVENOUS

## 2020-10-08 MED ORDER — CARVEDILOL 12.5 MG PO TABS
12.5000 mg | ORAL_TABLET | Freq: Two times a day (BID) | ORAL | Status: DC
  Administered 2020-10-09 – 2020-10-11 (×5): 12.5 mg via ORAL
  Filled 2020-10-08 (×5): qty 1

## 2020-10-08 MED ORDER — UMECLIDINIUM BROMIDE 62.5 MCG/INH IN AEPB
1.0000 | INHALATION_SPRAY | Freq: Every day | RESPIRATORY_TRACT | Status: DC
  Administered 2020-10-09: 1 via RESPIRATORY_TRACT
  Filled 2020-10-08: qty 7

## 2020-10-08 MED ORDER — SODIUM CHLORIDE 0.9 % IV SOLN
1.0000 g | INTRAVENOUS | Status: DC
  Administered 2020-10-09 – 2020-10-10 (×2): 1 g via INTRAVENOUS
  Filled 2020-10-08 (×2): qty 10

## 2020-10-08 MED ORDER — ASPIRIN EC 81 MG PO TBEC
81.0000 mg | DELAYED_RELEASE_TABLET | Freq: Every day | ORAL | Status: DC
  Administered 2020-10-09 – 2020-10-11 (×3): 81 mg via ORAL
  Filled 2020-10-08 (×3): qty 1

## 2020-10-08 MED ORDER — POLYETHYLENE GLYCOL 3350 17 G PO PACK: 17.0000 g | PACK | Freq: Every day | ORAL | Status: DC | PRN

## 2020-10-08 MED ORDER — PANTOPRAZOLE SODIUM 40 MG PO TBEC
40.0000 mg | DELAYED_RELEASE_TABLET | Freq: Every day | ORAL | Status: DC
  Administered 2020-10-09 – 2020-10-11 (×3): 40 mg via ORAL
  Filled 2020-10-08 (×3): qty 1

## 2020-10-08 MED ORDER — ACETAMINOPHEN 325 MG PO TABS: 650.0000 mg | ORAL_TABLET | Freq: Four times a day (QID) | ORAL | Status: DC | PRN

## 2020-10-08 MED ORDER — MELOXICAM 7.5 MG PO TABS
7.5000 mg | ORAL_TABLET | Freq: Every day | ORAL | Status: DC
  Administered 2020-10-09 – 2020-10-11 (×3): 7.5 mg via ORAL
  Filled 2020-10-08 (×3): qty 1

## 2020-10-08 MED ORDER — EZETIMIBE 10 MG PO TABS
10.0000 mg | ORAL_TABLET | Freq: Every day | ORAL | Status: DC
  Administered 2020-10-09 – 2020-10-11 (×3): 10 mg via ORAL
  Filled 2020-10-08 (×3): qty 1

## 2020-10-08 MED ORDER — SODIUM CHLORIDE 0.9 % IV BOLUS
500.0000 mL | Freq: Once | INTRAVENOUS | Status: AC
  Administered 2020-10-08: 500 mL via INTRAVENOUS

## 2020-10-08 MED ORDER — ONDANSETRON HCL 4 MG PO TABS: 4.0000 mg | ORAL_TABLET | Freq: Four times a day (QID) | ORAL | Status: DC | PRN

## 2020-10-08 MED ORDER — LEVOCETIRIZINE DIHYDROCHLORIDE 5 MG PO TABS: 5.0000 mg | ORAL_TABLET | Freq: Every evening | ORAL | Status: DC

## 2020-10-08 MED ORDER — NITROGLYCERIN 0.4 MG SL SUBL: 0.4000 mg | SUBLINGUAL_TABLET | SUBLINGUAL | Status: DC | PRN

## 2020-10-08 NOTE — ED Triage Notes (Signed)
Generalized weakness over the last couple days, skin warm and dry, alert and oriented, weakness with exertion per pt.

## 2020-10-08 NOTE — Plan of Care (Signed)

## 2020-10-08 NOTE — H&P (Signed)
Patient Demographics:    [name], is a 85 y.o. female  MRN: [id number]   DOB - 10-Apr-1936  Admit Date - 10/08/2020  Outpatient Primary MD for the patient is [name], NP   Assessment & Plan:    Principal Problem:   UTI (urinary tract infection) Active Problems:   Generalized weakness related to UTI   Chronic combined systolic and diastolic CHF (congestive heart failure) (HCC)   Essential hypertension   Chronic pain   COPD (chronic obstructive pulmonary disease) (HCC)   CAD S/P percutaneous coronary angioplasty   Meningioma (HCC)    1) UTI with generalized weakness and deconditioning/ambulatory dysfunction --- treat empirically with IV Rocephin pending further culture data   2)Generalized weakness and deconditioning/worsening ambulatory dysfunction --- patient with history of meningioma with worsening generalized weakness and deconditioning -Previously advised to go to assisted living facility but she declined -- Await physical therapy evaluation to determine disposition -Risk of falls----PTA pt lived alone and did very poorly, patient has significant limitations with mobility related ADLs- this patient needs to continue to be monitored in the hospital until further physical therapy evaluation, she may need SNF bed, she is not safe to go home with her current physcical limitations   3)Meningioma--present at least since 2017, neurosurgery including Dr. Ellene Route previously told patient that she is not a surgical candidate -Patient with worsening ambulatory dysfunction--- see #2 above  4)HFrEF--patient with chronic combined systolic and diastolic dysfunction CHF Echo from 01/01/2019 with EF of 45% - Be Judicious with IV fluids to avoid volume overload -Continue Coreg and  Entresto  5)Arthtralgia/arthritis/chronic pain syndrome --- continue Arava and meloxicam, trazodone as needed  6) COPD--- stable, no acute exacerbation at this time, continue Breo Ellipta and Incruse Ellipta, may use albuterol nebulizer as needed  7)CAD--- s/p DES to mid-LAD and DES to mid-OM1 in 11/2016-- -chest pain free, troponin and EKG nonacute (LBBB noted)  --continue atorvastatin and zetia and Aspirin  8)GERD-stable continue Protonix especially while on meloxicam/NSAIDs  9) chronic anemia--- hemoglobin 10.2 which is close to patient's baseline  10) chronic hypoxic respiratory failure----oxygen requirement currently at baseline- continue oxygen via nasal cannula at 2 to 3 L/min  Disposition/Need for in-Hospital Stay- patient unable to be discharged at this time due to UTI with generalized weakness and ambulatory dysfunction and fall risk requiring IV antibiotics while awaiting culture data, also awaiting PT eval to determine safety of discharge home  Dispo: The patient is from: Home               Anticipated d/c is to: TBD (SNF Vs Home with [facility])              Anticipated d/c date is: 1 day              Patient currently is not medically stable to d/c. Barriers: Not Clinically Stable-    With History of - Reviewed by me  Past Medical  History:  Diagnosis Date  . Acute on chronic respiratory failure with hypoxia ([place]) 04/24/2018  . Anxiety   . Atypical chest pain   . Bronchospasm   . CAD (coronary artery disease)    a. s/p DES to mid-LAD and DES to mid-OM1 in 11/2016  . Cervical disc disorder with myelopathy, unspecified cervical region   . Cervicalgia 01/19/2009   Qualifier: Diagnosis of  By: Aline Brochure MD, Dorothyann Peng    . Chest pain at rest 01/27/2015  . Chronic systolic (congestive) heart failure ([place])   . COPD (chronic obstructive pulmonary disease) ([place])   . Coronary artery disease   . De Quervain's disease (tenosynovitis) 04/02/2011  . Disc disease with myelopathy, cervical    . Essential hypertension   . Hemorrhoids   . Liver mass   . Lung, cysts, congenital    Left lung cyst  . Myocardial infarction ([place])   . Nephrolithiasis    Embedded  . Nonischemic cardiomyopathy ([place])    LVEF 35-40% 2015  . On home O2   . Osteoarthritis   . Sprain of wrist 08/07/2012  . Thoracic ascending aortic aneurysm (HCC)    4.3 cm April 2016      Past Surgical History:  Procedure Laterality Date  . Benign breast tumors    . CHOLECYSTECTOMY    . COLONOSCOPY    . COLONOSCOPY N/A 09/22/2014   Procedure: COLONOSCOPY;  Surgeon: Rogene Houston, MD;  Location: [facility];  Service: Endoscopy;  Laterality: N/A;  830 -- to be done in OR under fluoro  . Complete hysterectomy    . CORONARY STENT INTERVENTION N/A 11/23/2016   Procedure: Coronary Stent Intervention;  Surgeon: Martinique, Peter M, MD;  Location: [place] [facility];  Service: Cardiovascular;  Laterality: N/A;  . LEFT HEART CATH AND CORONARY ANGIOGRAPHY N/A 11/23/2016   Procedure: Left Heart Cath and Coronary Angiography;  Surgeon: Martinique, Peter M, MD;  Location: [place] [facility];  Service: Cardiovascular;  Laterality: N/A;  . TONSILLECTOMY AND ADENOIDECTOMY      Chief Complaint  Patient presents with  . Weakness    Weakness      HPI:    [name]  is a 85 y.o. female who is a reformed smoker with past medical history relevant for history of meningioma with ambulatory dysfunction and generalized weakness in the setting of arthralgia/arthritis, GERD, history of combined systolic and diastolic dysfunction CHF, COPD, chronic hypoxic respiratory failure uses 2 L of oxygen, GERD, history of CAD with prior angioplasty and stent placement, HTN presents to the ED with worsening weakness and inability to ambulate with concerns for fall risk, patient reports shaking chills urinary frequency -No flank pain no vomiting no diarrhea -No chest pains no palpitations no pleuritic symptoms -No increased dyspnea no leg pains or leg  swelling -In the ED patient is found to have low-grade fevers,  -COVID test negative -Chest x-ray without acute cardiopulmonary findings -Troponin is not elevated -WBC 7.2 hemoglobin 10.2 which is patient's baseline -Sodium is 134 creatinine 0.79, UA suggestive of UTI urine culture pending    Review of systems:    In addition to the HPI above,   A full Review of  Systems was done, all other systems reviewed are negative except as noted above in HPI , .   Social History:  Reviewed by me    Social History   Tobacco Use  . Smoking status: Former Smoker    Packs/day: 0.25    Years: 3.00  Pack years: 0.75    Types: Cigarettes    Start date: 05/14/1977    Quit date: 05/28/2008    Years since quitting: 12.3  . Smokeless tobacco: Never Used  . Tobacco comment: patient states she only smoked for 3 years total  Substance Use Topics  . Alcohol use: No    Alcohol/week: 0.0 standard drinks     Family History :  Reviewed by me    Family History  Problem Relation Age of Onset  . Heart disease Mother   . Aneurysm Father   . Lung cancer Brother   . Heart disease Sister   . Diabetes Brother   . Heart disease Brother     Home Medications:   Prior to Admission medications   Medication Sig Start Date End Date Taking? Authorizing Provider  aspirin EC 81 MG tablet Take 81 mg by mouth every morning.    Yes [provider]  carvedilol (COREG) 12.5 MG tablet Take 1 tablet (12.5 mg total) by mouth 2 (two) times daily. 09/22/19 03/23/20 Yes Herminio Commons, MD  clorazepate (TRANXENE) 7.5 MG tablet Take 1 tablet (7.5 mg total) by mouth daily as needed for anxiety. For nerves 07/22/20  Yes [name], NP  ENTRESTO 24-26 MG TAKE 1 TABLET BY MOUTH TWICE DAILY. 03/30/20  Yes BranchAlphonse Guild, MD  Fluticasone-Umeclidin-Vilant (TRELEGY ELLIPTA) 200-62.5-25 MCG/INH AEPB Inhale 1 puff into the lungs daily. 02/23/20  Yes Chesley Mires, MD  meclizine (ANTIVERT) 25 MG tablet Take  25 mg by mouth as needed for dizziness.   Yes [provider]  meloxicam (MOBIC) 7.5 MG tablet Take 7.5 mg by mouth daily.    Yes [provider]  nitroGLYCERIN (NITROSTAT) 0.4 MG SL tablet Place 1 tablet (0.4 mg total) under the tongue every 5 (five) minutes x 3 doses as needed for chest pain. 07/21/20  Yes [name], NP  OXYGEN Inhale 2 L into the lungs at bedtime.   Yes [provider]  traMADol (ULTRAM) 50 MG tablet Take 1 tablet (50 mg total) by mouth daily as needed for moderate pain or severe pain. Maximum dose= 8 tablets per day 06/14/20  Yes [name], NP  TRELEGY ELLIPTA 200-62.5-25 MCG/INH AEPB INHALE (1) PUFF BY MOUTH INTO THE LUNGS DAILY. Patient taking differently: Take 1 puff by mouth. 09/14/19  Yes Byrum, Rose Fillers, MD  VENTOLIN HFA 108 (90 Base) MCG/ACT inhaler INHALE 2 PUFFS INTO THE LUNGS FOUR TIMES DAILY. Patient taking differently: Inhale 2 puffs into the lungs every 4 (four) hours as needed for shortness of breath or wheezing. 02/24/20  Yes Chesley Mires, MD  albuterol (PROVENTIL) (2.5 MG/3ML) 0.083% nebulizer solution Take 3 mLs (2.5 mg total) by nebulization every 6 (six) hours as needed for wheezing or shortness of breath. Patient not taking: Reported on 10/08/2020 07/13/15   Sinda Du, MD  atorvastatin (LIPITOR) 80 MG tablet TAKE 1 TABLET BY MOUTH ONCE DAILY AT BEDTIME. Patient not taking: No sig reported 04/29/20   Arnoldo Lenis, MD  ezetimibe (ZETIA) 10 MG tablet Take 1 tablet (10 mg total) by mouth daily. Patient not taking: No sig reported 05/26/20   [name], NP  fluticasone [facility]) 50 MCG/ACT nasal spray Place 1 spray into both nostrils daily. Patient not taking: No sig reported 02/23/20   Chesley Mires, MD  ibuprofen (ADVIL) 800 MG tablet Take 800 mg by mouth 3 (three) times daily. Patient not taking: Reported on 10/08/2020 11/04/19   [provider]  leflunomide (ARAVA) 20 MG tablet Take 20 mg by mouth  daily. Patient not taking: No sig reported 08/16/20   [provider]  levocetirizine (XYZAL) 5 MG tablet Take 1 tablet (5 mg total) by mouth every evening. Patient not taking: No sig reported 02/23/20   Chesley Mires, MD  montelukast (SINGULAIR) 10 MG tablet Take 1 tablet (10 mg total) by mouth at bedtime. Patient not taking: No sig reported 02/23/20   Chesley Mires, MD  ondansetron (ZOFRAN ODT) 4 MG disintegrating tablet Take 1 tablet (4 mg total) by mouth every 8 (eight) hours as needed for nausea. Patient not taking: No sig reported 04/20/20   Noemi Chapel, MD     Allergies:     Allergies  Allergen Reactions  . Plavix [Clopidogrel Bisulfate] Itching    Severe itching  . Alprazolam Nausea And Vomiting  . Codeine Nausea And Vomiting  . Percodan [Oxycodone-Aspirin] Nausea And Vomiting  . Valium Nausea And Vomiting     Physical Exam:   Vitals  Blood pressure 136/72, pulse 87, temperature 98.4 F (36.9 C), temperature source Oral, resp. rate (!) 21, height 5\' 2"  (1.575 m), weight 76 kg, SpO2 97 %.  Physical Examination: General appearance - alert, well appearing, and in no distress  Mental status - alert, oriented to person, place, and time,  Eyes - sclera anicteric Nose- [place] 2L/min Neck - supple, no JVD elevation , Chest - clear  to auscultation bilaterally, symmetrical air movement,  Heart - S1 and S2 normal, regular  Abdomen - soft, nontender, nondistended, no CVA tenderness  neurological - screening mental status exam normal, neck supple without rigidity, cranial nerves II through XII intact, DTR's normal and symmetric -Generalized weakness without additional new focal deficits Extremities - no pedal edema noted, intact peripheral pulses  Skin - warm, dry     Data Review:    CBC Recent Labs  Lab 10/08/20 1346  WBC 7.2  HGB 10.2*  HCT 32.6*  PLT 225  MCV 81.9  MCH 25.6*  MCHC 31.3  RDW 15.4  LYMPHSABS 1.7  MONOABS 1.3*  EOSABS 0.1  BASOSABS 0.0     Chemistries  Recent Labs  Lab 10/08/20 1346  NA 134*  K 4.1  CL 102  CO2 28  GLUCOSE 105*  BUN 13  CREATININE 0.79  CALCIUM 8.7*  AST 14*  ALT 12  ALKPHOS 67  BILITOT 1.0   ------------------------------------------------------------------------------------------------------------------ estimated creatinine clearance is 50.9 mL/min (by C-G formula based on SCr of 0.79 mg/dL). ------------------------------------------------------------------------------------------------------------------ No results for input(s): TSH, T4TOTAL, T3FREE, THYROIDAB in the last 72 hours.  Invalid input(s): FREET3   Coagulation profile No results for input(s): INR, PROTIME in the last 168 hours. ------------------------------------------------------------------------------------------------------------------- No results for input(s): DDIMER in the last 72 hours. -------------------------------------------------------------------------------------------------------------------  Cardiac Enzymes No results for input(s): CKMB, TROPONINI, MYOGLOBIN in the last 168 hours.  Invalid input(s): CK ------------------------------------------------------------------------------------------------------------------    Component Value Date/Time   BNP 311.0 (H) 11/07/2019 1106    Urinalysis    Component Value Date/Time   COLORURINE YELLOW 10/08/2020 1317   APPEARANCEUR CLOUDY (A) 10/08/2020 1317   APPEARANCEUR Clear 05/25/2020 0944   LABSPEC 1.005 10/08/2020 1317   PHURINE 6.0 10/08/2020 1317   GLUCOSEU NEGATIVE 10/08/2020 1317   HGBUR SMALL (A) 10/08/2020 1317   BILIRUBINUR NEGATIVE 10/08/2020 1317   BILIRUBINUR Negative 05/25/2020 0944   KETONESUR 5 (A) 10/08/2020 1317   PROTEINUR 30 (A) 10/08/2020 1317   UROBILINOGEN 0.2 08/20/2013 1610   NITRITE  POSITIVE (A) 10/08/2020 1317   LEUKOCYTESUR LARGE (A) 10/08/2020 1317     Imaging Results:    DG Chest Portable 1 View  Result Date:  10/08/2020 CLINICAL DATA:  PT c/o weakness and fatigue for several days. Denies cp, fever, sob, or N/V. HX of CHF and hypertension. EXAM: PORTABLE CHEST 1 VIEW COMPARISON:  04/20/2020. FINDINGS: Mild stable enlargement of the cardiopericardial silhouette. No mediastinal or hilar masses. Lungs are hyperexpanded, but clear. No pleural effusion or pneumothorax. Stable changes from cervical spine fusion. Stable left shoulder arthroplasty. Skeletal structures are diffusely demineralized. IMPRESSION: No acute cardiopulmonary disease. Electronically Signed   By: Lajean Manes M.D.   On: 10/08/2020 13:47    Radiological Exams on Admission: DG Chest Portable 1 View  Result Date: 10/08/2020 CLINICAL DATA:  PT c/o weakness and fatigue for several days. Denies cp, fever, sob, or N/V. HX of CHF and hypertension. EXAM: PORTABLE CHEST 1 VIEW COMPARISON:  04/20/2020. FINDINGS: Mild stable enlargement of the cardiopericardial silhouette. No mediastinal or hilar masses. Lungs are hyperexpanded, but clear. No pleural effusion or pneumothorax. Stable changes from cervical spine fusion. Stable left shoulder arthroplasty. Skeletal structures are diffusely demineralized. IMPRESSION: No acute cardiopulmonary disease. Electronically Signed   By: Lajean Manes M.D.   On: 10/08/2020 13:47    DVT Prophylaxis -SCD/Heparin AM Labs Ordered, also please review Full Orders  Family Communication: Admission, patients condition and plan of care including tests being ordered have been discussed with the patient  who indicate understanding and agree with the plan   Code Status - Full Code  Likely DC to  TBD (SNF Vs Home with [facility])  Condition  -stable  Roxan Hockey M.D on 10/08/2020 at 5:28 PM Go to [email or web address] -  for contact info  [facility] - Office  [phone]

## 2020-10-08 NOTE — ED Provider Notes (Addendum)
Emergency Department Provider Note   I have reviewed the triage vital signs and the nursing notes.   HISTORY  Chief Complaint Weakness (Weakness)   HPI [name] is a 85 y.o. female with past medical history reviewed below presents to the emergency department with generalized weakness, shaking chills, urinary frequency.  Symptoms began yesterday.  She denies any chest pain, shortness of breath, cough.  No upper respiratory infection symptoms.  Denies headaches.  She states that she typically has pain all over but is more sore and fatigued.  She has been up and ambulatory.  She is noted that she does need to use her oxygen more than normal but has a to use as needed. No radiation of symptoms or modifying factors.   Past Medical History:  Diagnosis Date  . Acute on chronic respiratory failure with hypoxia ([place]) 04/24/2018  . Anxiety   . Atypical chest pain   . Bronchospasm   . CAD (coronary artery disease)    a. s/p DES to mid-LAD and DES to mid-OM1 in 11/2016  . Cervical disc disorder with myelopathy, unspecified cervical region   . Cervicalgia 01/19/2009   Qualifier: Diagnosis of  By: Aline Brochure MD, Dorothyann Peng    . Chest pain at rest 01/27/2015  . Chronic systolic (congestive) heart failure ([place])   . COPD (chronic obstructive pulmonary disease) ([place])   . Coronary artery disease   . De Quervain's disease (tenosynovitis) 04/02/2011  . Disc disease with myelopathy, cervical   . Essential hypertension   . Hemorrhoids   . Liver mass   . Lung, cysts, congenital    Left lung cyst  . Myocardial infarction ([place])   . Nephrolithiasis    Embedded  . Nonischemic cardiomyopathy ([place])    LVEF 35-40% 2015  . On home O2   . Osteoarthritis   . Sprain of wrist 08/07/2012  . Thoracic ascending aortic aneurysm ([facility])    4.3 cm April 2016    Patient Active Problem List   Diagnosis Date Noted  . UTI (urinary tract infection) 10/08/2020  . Generalized weakness related to UTI 10/08/2020  .  Dysuria 07/20/2020  . Meningioma ([place]) 07/20/2020  . Left breast mass 05/27/2020  . Impaired fasting glucose 04/27/2020  . Cognitive impairment 04/27/2020  . Urinary incontinence 04/27/2020  . Chronic stable angina ([place]) 04/24/2018  . Physical deconditioning 04/24/2018  . CAD (coronary artery disease) 02/03/2018  . Ischemic cardiomyopathy 11/25/2017  . COPD (chronic obstructive pulmonary disease) ([place]) 04/01/2017  . CAD S/P percutaneous coronary angioplasty 04/01/2017  . NSTEMI (non-ST elevated myocardial infarction) ([place]) 11/23/2016  . Hypertensive cardiovascular disease 10/03/2015  . Protein-calorie malnutrition, severe 07/13/2015  . Demand ischemia of myocardium ([place]) 07/13/2015  . Nonischemic cardiomyopathy ([place])   . Left bundle branch block   . GERD (gastroesophageal reflux disease) 07/11/2015  . Ascending aortic aneurysm ([place]) 01/27/2015  . Chronic combined systolic and diastolic CHF (congestive heart failure) ([place]) 01/27/2015  . Essential hypertension 01/27/2015  . Anxiety 01/27/2015  . Chronic pain 01/27/2015  . Liver mass 05/21/2011  . SHOULDER PAIN 01/19/2009    Past Surgical History:  Procedure Laterality Date  . Benign breast tumors    . CHOLECYSTECTOMY    . COLONOSCOPY    . COLONOSCOPY N/A 09/22/2014   Procedure: COLONOSCOPY;  Surgeon: Rogene Houston, MD;  Location[facility];  Service: Endoscopy;  Laterality: N/A;  830 -- to be done in OR under fluoro  . Complete hysterectomy    . CORONARY  STENT INTERVENTION N/A 11/23/2016   Procedure: Coronary Stent Intervention;  Surgeon: Martinique, Peter M, MD;  Location: [facility];  Service: Cardiovascular;  Laterality: N/A;  . LEFT HEART CATH AND CORONARY ANGIOGRAPHY N/A 11/23/2016   Procedure: Left Heart Cath and Coronary Angiography;  Surgeon: Martinique, Peter M, MD;  Location: [facility];  Service: Cardiovascular;  Laterality: N/A;  . TONSILLECTOMY AND ADENOIDECTOMY      Allergies Plavix [clopidogrel  bisulfate], Alprazolam, Codeine, Percodan [oxycodone-aspirin], and Valium  Family History  Problem Relation Age of Onset  . Heart disease Mother   . Aneurysm Father   . Lung cancer Brother   . Heart disease Sister   . Diabetes Brother   . Heart disease Brother     Social History Social History   Tobacco Use  . Smoking status: Former Smoker    Packs/day: 0.25    Years: 3.00    Pack years: 0.75    Types: Cigarettes    Start date: 05/14/1977    Quit date: 05/28/2008    Years since quitting: 12.3  . Smokeless tobacco: Never Used  . Tobacco comment: patient states she only smoked for 3 years total  Vaping Use  . Vaping Use: Never used  Substance Use Topics  . Alcohol use: No    Alcohol/week: 0.0 standard drinks  . Drug use: No    Review of Systems  Constitutional: No fever. Positive chills, body aches, and generalized weakness.  Eyes: No visual changes. ENT: No sore throat. Cardiovascular: Denies chest pain. Respiratory: Denies shortness of breath. Gastrointestinal: No abdominal pain.  No nausea, no vomiting.  No diarrhea.  No constipation. Genitourinary: Negative for dysuria. Musculoskeletal: Negative for back pain. Skin: Negative for rash. Neurological: Negative for headaches, focal weakness or numbness.  10-point ROS otherwise negative.  ____________________________________________   PHYSICAL EXAM:  VITAL SIGNS: ED Triage Vitals  Enc Vitals Group     BP 10/08/20 1252 (!) 117/40     Pulse Rate 10/08/20 1252 83     Resp 10/08/20 1252 20     Temp 10/08/20 1252 99.4 F (37.4 C)     Temp Source 10/08/20 1252 Oral     SpO2 10/08/20 1252 98 %     Weight 10/08/20 1254 167 lb 8.8 oz (76 kg)     Height 10/08/20 1254 5\' 2"  (1.575 m)   Constitutional: Alert and oriented. Well appearing and in no acute distress. Eyes: Conjunctivae are normal. Head: Atraumatic. Nose: No congestion/rhinnorhea. Mouth/Throat: Mucous membranes are moist.   Neck: No stridor.    Cardiovascular: Normal rate, regular rhythm. Good peripheral circulation. Grossly normal heart sounds.   Respiratory: Normal respiratory effort.  No retractions. Lungs CTAB. Gastrointestinal: Soft and nontender. No distention.  Musculoskeletal: No lower extremity tenderness nor edema. No gross deformities of extremities. Neurologic:  Normal speech and language. No gross focal neurologic deficits are appreciated.  Skin:  Skin is warm, dry and intact. No rash noted.  ____________________________________________   LABS (all labs ordered are listed, but only abnormal results are displayed)  Labs Reviewed  COMPREHENSIVE METABOLIC PANEL - Abnormal; Notable for the following components:      Result Value   Sodium 134 (*)    Glucose, Bld 105 (*)    Calcium 8.7 (*)    Total Protein 6.1 (*)    Albumin 3.2 (*)    AST 14 (*)    Anion gap 4 (*)    All other components within normal limits  CBC  WITH DIFFERENTIAL/PLATELET - Abnormal; Notable for the following components:   Hemoglobin 10.2 (*)    HCT 32.6 (*)    MCH 25.6 (*)    Monocytes Absolute 1.3 (*)    All other components within normal limits  URINALYSIS, ROUTINE W REFLEX MICROSCOPIC - Abnormal; Notable for the following components:   APPearance CLOUDY (*)    Hgb urine dipstick SMALL (*)    Ketones, ur 5 (*)    Protein, ur 30 (*)    Nitrite POSITIVE (*)    Leukocytes,Ua LARGE (*)    WBC, UA >50 (*)    Bacteria, UA MANY (*)    All other components within normal limits  BASIC METABOLIC PANEL - Abnormal; Notable for the following components:   Potassium 3.4 (*)    Glucose, Bld 102 (*)    Calcium 8.7 (*)    All other components within normal limits  CBC - Abnormal; Notable for the following components:   Hemoglobin 10.1 (*)    HCT 32.9 (*)    MCH 25.2 (*)    All other components within normal limits  RESP PANEL BY RT-PCR (FLU A&B, COVID) [id number]  URINE CULTURE  TROPONIN I (HIGH SENSITIVITY)  TROPONIN I (HIGH SENSITIVITY)    ____________________________________________  EKG   EKG Interpretation  Date/Time:  Saturday Oct 08 2020 13:01:37 EDT Ventricular Rate:  76 PR Interval:  172 QRS Duration: 162 QT Interval:  431 QTC Calculation: 485 R Axis:   22 Text Interpretation: Sinus rhythm Left bundle branch block Similar to prior. No PVCs Confirmed by Nanda Quinton [phone]) on 10/08/2020 2:10:17 PM       ____________________________________________  RADIOLOGY  DG Chest Portable 1 View  Result Date: 10/08/2020 CLINICAL DATA:  PT c/o weakness and fatigue for several days. Denies cp, fever, sob, or N/V. HX of CHF and hypertension. EXAM: PORTABLE CHEST 1 VIEW COMPARISON:  04/20/2020. FINDINGS: Mild stable enlargement of the cardiopericardial silhouette. No mediastinal or hilar masses. Lungs are hyperexpanded, but clear. No pleural effusion or pneumothorax. Stable changes from cervical spine fusion. Stable left shoulder arthroplasty. Skeletal structures are diffusely demineralized. IMPRESSION: No acute cardiopulmonary disease. Electronically Signed   By: Lajean Manes M.D.   On: 10/08/2020 13:47    ____________________________________________   PROCEDURES  Procedure(s) performed:   Procedures  None  ____________________________________________   INITIAL IMPRESSION / ASSESSMENT AND PLAN / ED COURSE  Pertinent labs & imaging results that were available during my care of the patient were reviewed by me and considered in my medical decision making (see chart for details).   Presents to the emergency department with generalized weakness, body aches, urine frequency.  She suspects a possible urinary tract infection when she has had in the past.  Has required some oxygen today more than normal.  Considering respiratory etiology as well.  Plan for chest x-ray, COVID test, labs.   02:10 PM  UA significant for urinary tract infection.  Plan for Rocephin.  Remaining labs are pending  Discussed patient's case  with [facility] to request admission. Patient and family (if present) updated with plan. Care transferred to [facility] service.  I reviewed all nursing notes, vitals, pertinent old records, EKGs, labs, imaging (as available).  ____________________________________________  FINAL CLINICAL IMPRESSION(S) / ED DIAGNOSES  Final diagnoses:  Generalized weakness  Acute cystitis without hematuria     MEDICATIONS GIVEN DURING THIS VISIT:  Medications  aspirin EC tablet 81 mg (has no administration in time range)  albuterol (PROVENTIL) (2.5 MG/3ML) 0.083% nebulizer  solution 2.5 mg (has no administration in time range)  meloxicam (MOBIC) tablet 7.5 mg (has no administration in time range)  carvedilol (COREG) tablet 12.5 mg (12.5 mg Oral Not Given 10/09/20 0457)  montelukast (SINGULAIR) tablet 10 mg (10 mg Oral Given 10/08/20 2249)  fluticasone (FLONASE) 50 MCG/ACT nasal spray 1 spray (has no administration in time range)  sacubitril-valsartan (ENTRESTO) 24-26 mg per tablet (1 tablet Oral Not Given 10/09/20 0457)  atorvastatin (LIPITOR) tablet 80 mg (80 mg Oral Given 10/08/20 2249)  ezetimibe (ZETIA) tablet 10 mg (10 mg Oral Not Given 10/08/20 1721)  traMADol (ULTRAM) tablet 50 mg (has no administration in time range)  nitroGLYCERIN (NITROSTAT) SL tablet 0.4 mg (has no administration in time range)  leflunomide (ARAVA) tablet 20 mg (has no administration in time range)  sodium chloride flush (NS) 0.9 % injection 3 mL (has no administration in time range)  sodium chloride flush (NS) 0.9 % injection 3 mL (has no administration in time range)  sodium chloride flush (NS) 0.9 % injection 3 mL (has no administration in time range)  0.9 %  sodium chloride infusion (has no administration in time range)  acetaminophen (TYLENOL) tablet 650 mg (has no administration in time range)    Or  acetaminophen (TYLENOL) suppository 650 mg (has no administration in time range)  traZODone (DESYREL) tablet 50 mg (has no  administration in time range)  polyethylene glycol (MIRALAX / GLYCOLAX) packet 17 g (has no administration in time range)  bisacodyl (DULCOLAX) suppository 10 mg (has no administration in time range)  ondansetron (ZOFRAN) tablet 4 mg (has no administration in time range)    Or  ondansetron (ZOFRAN) injection 4 mg (has no administration in time range)  heparin injection 5,000 Units (5,000 Units Subcutaneous Given 10/08/20 2249)  cefTRIAXone (ROCEPHIN) 1 g in sodium chloride 0.9 % 100 mL IVPB (has no administration in time range)  loratadine (CLARITIN) tablet 10 mg (10 mg Oral Not Given 10/08/20 1722)  fluticasone furoate-vilanterol (BREO ELLIPTA) 100-25 MCG/INH 1 puff (has no administration in time range)    And  umeclidinium bromide (INCRUSE ELLIPTA) 62.5 MCG/INH 1 puff (has no administration in time range)  pantoprazole (PROTONIX) EC tablet 40 mg (40 mg Oral Not Given 10/08/20 1840)  sodium chloride 0.9 % bolus 500 mL (0 mLs Intravenous Stopped 10/08/20 1444)  cefTRIAXone (ROCEPHIN) 1 g in sodium chloride 0.9 % 100 mL IVPB (0 g Intravenous Stopped 10/08/20 1523)      Note:  This document was prepared using Dragon voice recognition software and may include unintentional dictation errors.  Nanda Quinton, MD, [facility]    Jazmen Lindenbaum, [facility], MD 10/09/20 Bennie Dallas, MD 10/09/20 [phone]

## 2020-10-08 NOTE — ED Notes (Signed)
Got pt  An extra pillow

## 2020-10-09 ENCOUNTER — Other Ambulatory Visit: Payer: Self-pay

## 2020-10-09 DIAGNOSIS — I428 Other cardiomyopathies: Secondary | ICD-10-CM | POA: Diagnosis present

## 2020-10-09 DIAGNOSIS — I2510 Atherosclerotic heart disease of native coronary artery without angina pectoris: Secondary | ICD-10-CM | POA: Diagnosis present

## 2020-10-09 DIAGNOSIS — G894 Chronic pain syndrome: Secondary | ICD-10-CM | POA: Diagnosis present

## 2020-10-09 DIAGNOSIS — Z888 Allergy status to other drugs, medicaments and biological substances status: Secondary | ICD-10-CM | POA: Diagnosis not present

## 2020-10-09 DIAGNOSIS — R531 Weakness: Secondary | ICD-10-CM | POA: Diagnosis not present

## 2020-10-09 DIAGNOSIS — J432 Centrilobular emphysema: Secondary | ICD-10-CM

## 2020-10-09 DIAGNOSIS — Z1611 Resistance to penicillins: Secondary | ICD-10-CM | POA: Diagnosis present

## 2020-10-09 DIAGNOSIS — I5042 Chronic combined systolic (congestive) and diastolic (congestive) heart failure: Secondary | ICD-10-CM | POA: Diagnosis present

## 2020-10-09 DIAGNOSIS — I10 Essential (primary) hypertension: Secondary | ICD-10-CM

## 2020-10-09 DIAGNOSIS — R269 Unspecified abnormalities of gait and mobility: Secondary | ICD-10-CM | POA: Diagnosis present

## 2020-10-09 DIAGNOSIS — I712 Thoracic aortic aneurysm, without rupture: Secondary | ICD-10-CM | POA: Diagnosis present

## 2020-10-09 DIAGNOSIS — I252 Old myocardial infarction: Secondary | ICD-10-CM | POA: Diagnosis not present

## 2020-10-09 DIAGNOSIS — F419 Anxiety disorder, unspecified: Secondary | ICD-10-CM | POA: Diagnosis not present

## 2020-10-09 DIAGNOSIS — D329 Benign neoplasm of meninges, unspecified: Secondary | ICD-10-CM

## 2020-10-09 DIAGNOSIS — N3000 Acute cystitis without hematuria: Secondary | ICD-10-CM | POA: Diagnosis not present

## 2020-10-09 DIAGNOSIS — J449 Chronic obstructive pulmonary disease, unspecified: Secondary | ICD-10-CM | POA: Diagnosis present

## 2020-10-09 DIAGNOSIS — Z20822 Contact with and (suspected) exposure to covid-19: Secondary | ICD-10-CM | POA: Diagnosis present

## 2020-10-09 DIAGNOSIS — M1990 Unspecified osteoarthritis, unspecified site: Secondary | ICD-10-CM | POA: Diagnosis present

## 2020-10-09 DIAGNOSIS — Z87442 Personal history of urinary calculi: Secondary | ICD-10-CM | POA: Diagnosis not present

## 2020-10-09 DIAGNOSIS — D649 Anemia, unspecified: Secondary | ICD-10-CM | POA: Diagnosis present

## 2020-10-09 DIAGNOSIS — J9611 Chronic respiratory failure with hypoxia: Secondary | ICD-10-CM | POA: Diagnosis present

## 2020-10-09 DIAGNOSIS — K219 Gastro-esophageal reflux disease without esophagitis: Secondary | ICD-10-CM | POA: Diagnosis present

## 2020-10-09 DIAGNOSIS — B9620 Unspecified Escherichia coli [E. coli] as the cause of diseases classified elsewhere: Secondary | ICD-10-CM | POA: Diagnosis present

## 2020-10-09 DIAGNOSIS — Z885 Allergy status to narcotic agent status: Secondary | ICD-10-CM | POA: Diagnosis not present

## 2020-10-09 DIAGNOSIS — I110 Hypertensive heart disease with heart failure: Secondary | ICD-10-CM | POA: Diagnosis present

## 2020-10-09 DIAGNOSIS — Z9861 Coronary angioplasty status: Secondary | ICD-10-CM

## 2020-10-09 DIAGNOSIS — Z8249 Family history of ischemic heart disease and other diseases of the circulatory system: Secondary | ICD-10-CM | POA: Diagnosis not present

## 2020-10-09 DIAGNOSIS — Z9981 Dependence on supplemental oxygen: Secondary | ICD-10-CM | POA: Diagnosis not present

## 2020-10-09 DIAGNOSIS — Z955 Presence of coronary angioplasty implant and graft: Secondary | ICD-10-CM | POA: Diagnosis not present

## 2020-10-09 DIAGNOSIS — N390 Urinary tract infection, site not specified: Secondary | ICD-10-CM | POA: Diagnosis present

## 2020-10-09 LAB — CBC
HCT: 32.9 % — ABNORMAL LOW (ref 36.0–46.0)
Hemoglobin: 10.1 g/dL — ABNORMAL LOW (ref 12.0–15.0)
MCH: 25.2 pg — ABNORMAL LOW (ref 26.0–34.0)
MCHC: 30.7 g/dL (ref 30.0–36.0)
MCV: 82 fL (ref 80.0–100.0)
Platelets: 251 10*3/uL (ref 150–400)
RBC: 4.01 MIL/uL (ref 3.87–5.11)
RDW: 15.5 % (ref 11.5–15.5)
WBC: 7.3 10*3/uL (ref 4.0–10.5)
nRBC: 0 % (ref 0.0–0.2)

## 2020-10-09 LAB — BASIC METABOLIC PANEL
Anion gap: 1 — ABNORMAL LOW (ref 5–15)
BUN: 11 mg/dL (ref 8–23)
CO2: 31 mmol/L (ref 22–32)
Calcium: 8.7 mg/dL — ABNORMAL LOW (ref 8.9–10.3)
Chloride: 104 mmol/L (ref 98–111)
Creatinine, Ser: 0.63 mg/dL (ref 0.44–1.00)
GFR, Estimated: >60 mL/min (ref >60)
Glucose, Bld: 102 mg/dL — ABNORMAL HIGH (ref 70–99)
Potassium: 3.4 mmol/L — ABNORMAL LOW (ref 3.5–5.1)
Sodium: 136 mmol/L (ref 135–145)

## 2020-10-09 NOTE — TOC Initial Note (Signed)
Transition of Care [facility]) - Initial/Assessment Note    Patient Details  Name: [name] MRN: [id number] Date of Birth: 1935/12/07  [facility] CM/SW Contact:    Natasha Bence, LCSW Phone Number: 10/09/2020, 5:13 PM  Clinical Narrative:                 Patient is a 85 year old female admitted for UTI (urinary tract infection). CSW conducted initial assessment. Patient reported that she is struggling to complete her ADL's independently. Patient reported that she had be discussing placement with her PCP prior to admission. Patient reported that she preferred short term rehab such as the [facility] to improve her mobility with performing her ADL's before considering long term care. Patient reported that her sister is with [facility] and she would prefer to go there for SNF, she is also open to [facility]. CSW completed FL2 and faxed patient referral. TOC to follow.   Expected Discharge Plan: Skilled Nursing Facility Barriers to Discharge: Continued Medical Work up   Patient Goals and CMS Choice Patient states their goals for this hospitalization and ongoing recovery are:: Return home with SNF CMS Medicare.gov Compare Post Acute Care list provided to:: Patient Choice offered to / list presented to : Patient  Expected Discharge Plan and Services Expected Discharge Plan: [place] Acute Care Choice: NA Living arrangements for the past 2 months: Single Family Home                                      Prior Living Arrangements/Services Living arrangements for the past 2 months: Single Family Home Lives with:: Self Patient language and need for interpreter reviewed:: Yes Do you feel safe going back to the place where you live?: Yes      Need for Family Participation in Patient Care: Yes (Comment) Care giver support system in place?: Yes (comment)   Criminal Activity/Legal Involvement Pertinent to Current Situation/Hospitalization: No  - Comment as needed  Activities of Daily Living Home Assistive Devices/Equipment: None ADL Screening (condition at time of admission) Patient's cognitive ability adequate to safely complete daily activities?: Yes Is the patient deaf or have difficulty hearing?: No Does the patient have difficulty seeing, even when wearing glasses/contacts?: No Does the patient have difficulty concentrating, remembering, or making decisions?: No Patient able to express need for assistance with ADLs?: Yes Does the patient have difficulty dressing or bathing?: No Independently performs ADLs?: Yes (appropriate for developmental age) Does the patient have difficulty walking or climbing stairs?: No Weakness of Legs: None Weakness of Arms/Hands: None  Permission Sought/Granted Permission sought to share information with : Family Supports Permission granted to share information with : Yes, Verbal Permission Granted     Permission granted to share info w AGENCY: [facility] and [facility]        Emotional Assessment     Affect (typically observed): Accepting,Adaptable Orientation: : Oriented to Self,Oriented to Place,Oriented to  Time,Oriented to Situation Alcohol / Substance Use: Not Applicable Psych Involvement: No (comment)  Admission diagnosis:  UTI (urinary tract infection) [N39.0] Acute cystitis without hematuria [N30.00] Generalized weakness [R53.1] Patient Active Problem List   Diagnosis Date Noted  . UTI (urinary tract infection) 10/08/2020  . Generalized weakness related to UTI 10/08/2020  . Dysuria 07/20/2020  . Meningioma ([place]) 07/20/2020  . Left breast mass 05/27/2020  .  Impaired fasting glucose 04/27/2020  . Cognitive impairment 04/27/2020  . Urinary incontinence 04/27/2020  . Chronic stable angina ([place]) 04/24/2018  . Physical deconditioning 04/24/2018  . CAD (coronary artery disease) 02/03/2018  . Ischemic cardiomyopathy 11/25/2017  . COPD (chronic obstructive pulmonary disease) ([place])  04/01/2017  . CAD S/P percutaneous coronary angioplasty 04/01/2017  . NSTEMI (non-ST elevated myocardial infarction) ([place]) 11/23/2016  . Hypertensive cardiovascular disease 10/03/2015  . Protein-calorie malnutrition, severe 07/13/2015  . Demand ischemia of myocardium ([place]) 07/13/2015  . Nonischemic cardiomyopathy ([place])   . Left bundle branch block   . GERD (gastroesophageal reflux disease) 07/11/2015  . Ascending aortic aneurysm ([place]) 01/27/2015  . Chronic combined systolic and diastolic CHF (congestive heart failure) ([place]) 01/27/2015  . Essential hypertension 01/27/2015  . Anxiety 01/27/2015  . Chronic pain 01/27/2015  . Liver mass 05/21/2011  . SHOULDER PAIN 01/19/2009   PCP:  [name], NP Pharmacy:   [facility], [place] [address] Phone: [phone] Fax: [phone]     Social Determinants of Health (SDOH) Interventions    Readmission Risk Interventions No flowsheet data found.

## 2020-10-09 NOTE — Plan of Care (Signed)
  Problem: Acute Rehab PT Goals(only PT should resolve) Goal: Patient Will Transfer Sit To/From Stand Outcome: Progressing Flowsheets (Taken 10/09/2020 1105) Patient will transfer sit to/from stand: with modified independence Goal: Pt Will Transfer Bed To Chair/Chair To Bed Outcome: Progressing Flowsheets (Taken 10/09/2020 1105) Pt will Transfer Bed to Chair/Chair to Bed: with modified independence Goal: Pt Will Ambulate Outcome: Progressing Flowsheets (Taken 10/09/2020 1105) Pt will Ambulate:  > 125 feet  with modified independence Goal: Pt/caregiver will Perform Home Exercise Program Outcome: Progressing Flowsheets (Taken 10/09/2020 1105) Pt/caregiver will Perform Home Exercise Program:  For increased strengthening  For improved balance  Independently  11:05 AM, 10/09/20 Mearl Latin PT, DPT Physical Therapist at [facility]

## 2020-10-09 NOTE — Evaluation (Signed)
Physical Therapy Evaluation Patient Details Name: [name] MRN: [id number] DOB: 12-09-1935 Today's Date: 10/09/2020   History of Present Illness  [name]  is a 85 y.o. female who is a reformed smoker with past medical history relevant for history of meningioma with ambulatory dysfunction and generalized weakness in the setting of arthralgia/arthritis, GERD, history of combined systolic and diastolic dysfunction CHF, COPD, chronic hypoxic respiratory failure uses 2 L of oxygen, GERD, history of CAD with prior angioplasty and stent placement, HTN presents to the ED with worsening weakness and inability to ambulate with concerns for fall risk, patient reports shaking chills urinary frequency     Clinical Impression  Patient limited for functional mobility as stated below secondary to slight BLE weakness, fatigue and impaired standing balance. Patient O2 sat monitored at rest and with mobility and she maintained saturation at 94-95% on room air. Patient does not require assist for bed mobility and completes with slightly slow, labored movements. Patient provided with min G with tranfers for safety/balance with use of RW. Patient ambulates with decreased cadence with use of RW without loss of balance.Patient returned to bed at end of session. Patient may benefit from moving to a ALF to assist with ADL and safety due to generalized strength and balance deficits. Patient will benefit from continued physical therapy in hospital and recommended venue below to increase strength, balance, endurance for safe ADLs and gait.     Follow Up Recommendations Supervision for mobility/OOB;Other (comment) (ALF)    Equipment Recommendations  None recommended by PT    Recommendations for Other Services       Precautions / Restrictions Precautions Precautions: Fall Restrictions Weight Bearing Restrictions: No      Mobility  Bed Mobility Overal bed mobility: Modified Independent              General bed mobility comments: slightly slow, labored transition to seated EOB    Transfers Overall transfer level: Needs assistance Equipment used: Rolling walker (2 wheeled) Transfers: Sit to/from [facility] Sit to Stand: Min guard Stand pivot transfers: Min guard       General transfer comment: min guard for safety/balance without loss of balance with use of RW  Ambulation/Gait Ambulation/Gait assistance: Supervision Gait Distance (Feet): 125 Feet Assistive device: Rolling walker (2 wheeled) Gait Pattern/deviations: Decreased step length - right;Decreased step length - left;Decreased stride length Gait velocity: decreased   General Gait Details: intermittent LE unsteadiness with use of RW  Stairs            Wheelchair Mobility    Modified Rankin (Stroke Patients Only)       Balance Overall balance assessment: Needs assistance Sitting-balance support: Feet supported;No upper extremity supported Sitting balance-Leahy Scale: Normal Sitting balance - Comments: seated EOB   Standing balance support: Bilateral upper extremity supported Standing balance-Leahy Scale: Fair Standing balance comment: good/fair with RW                             Pertinent Vitals/Pain Pain Assessment: No/denies pain    Home Living Family/patient expects to be discharged to:: Private residence Living Arrangements: Alone Available Help at Discharge: Family Type of Home: House Home Access: Stairs to enter Entrance Stairs-Rails: None Entrance Stairs-Number of Steps: 1 Home Layout: One level;Able to live on main level with bedroom/bathroom;Laundry or work area in [employer]: Grab bars - tub/shower;Grab bars - toilet;Walker - 4 wheels;Cane - single point;Bedside commode  Prior Function Level of Independence: Independent with assistive device(s)         Comments: Patient states household ambulator with SPC, independent with basic ADL      Hand Dominance        Extremity/Trunk Assessment   Upper Extremity Assessment Upper Extremity Assessment: Overall WFL for tasks assessed    Lower Extremity Assessment Lower Extremity Assessment: Overall WFL for tasks assessed    Cervical / Trunk Assessment Cervical / Trunk Assessment: Normal  Communication   Communication: No difficulties  Cognition Arousal/Alertness: Awake/alert Behavior During Therapy: WFL for tasks assessed/performed Overall Cognitive Status: Within Functional Limits for tasks assessed                                        General Comments      Exercises     Assessment/Plan    PT Assessment Patient needs continued PT services  PT Problem List Decreased strength;Decreased mobility;Decreased activity tolerance;Decreased balance       PT Treatment Interventions DME instruction;Therapeutic exercise;Gait training;Balance training;Stair training;Neuromuscular re-education;Functional mobility training;Therapeutic activities;Patient/family education    PT Goals (Current goals can be found in the Care Plan section)  Acute Rehab PT Goals Patient Stated Goal: Return home PT Goal Formulation: With patient Time For Goal Achievement: 10/23/20 Potential to Achieve Goals: Good    Frequency Min 3X/week   Barriers to discharge        Co-evaluation               AM-PAC PT "6 Clicks" Mobility  Outcome Measure Help needed turning from your back to your side while in a flat bed without using bedrails?: None Help needed moving from lying on your back to sitting on the side of a flat bed without using bedrails?: None Help needed moving to and from a bed to a chair (including a wheelchair)?: A Little Help needed standing up from a chair using your arms (e.g., wheelchair or bedside chair)?: A Little Help needed to walk in hospital room?: A Little Help needed climbing 3-5 steps with a railing? : A Lot 6 Click Score: 19    End of  Session Equipment Utilized During Treatment: Gait belt Activity Tolerance: Patient tolerated treatment well Patient left: in bed;with call bell/phone within reach Nurse Communication: Mobility status PT Visit Diagnosis: Unsteadiness on feet (R26.81);Other abnormalities of gait and mobility (R26.89)    Time: 0223-3612 PT Time Calculation (min) (ACUTE ONLY): 22 min   Charges:   PT Evaluation $PT Eval Low Complexity: 1 Low          11:03 AM, 10/09/20 Mearl Latin PT, DPT Physical Therapist at [facility]

## 2020-10-09 NOTE — Progress Notes (Addendum)
PROGRESS NOTE    [name]  WYO:[id number] DOB: December 28, 1935 DOA: 10/08/2020 PCP: [name], NP   Chief Complaint  Patient presents with  . Weakness    Weakness    Brief admission narrative:  As per H&P written by Dr. [name] on 10/08/2020  [name]  is a 85 y.o. female who is a reformed smoker with past medical history relevant for history of meningioma with ambulatory dysfunction and generalized weakness in the setting of arthralgia/arthritis, GERD, history of combined systolic and diastolic dysfunction CHF, COPD, chronic hypoxic respiratory failure uses 2 L of oxygen, GERD, history of CAD with prior angioplasty and stent placement, HTN presents to the ED with worsening weakness and inability to ambulate with concerns for fall risk, patient reports shaking chills urinary frequency -No flank pain no vomiting no diarrhea -No chest pains no palpitations no pleuritic symptoms -No increased dyspnea no leg pains or leg swelling -In the ED patient is found to have low-grade fevers,  -COVID test negative -Chest x-ray without acute cardiopulmonary findings -Troponin is not elevated -WBC 7.2 hemoglobin 10.2 which is patient's baseline -Sodium is 134 creatinine 0.79, UA suggestive of UTI urine culture pending   Assessment & Plan: 1-UTI (urinary tract infection) -Cultures pending -Reporting increased frequency; no frank dysuria, fever, nausea or vomiting. -Continue Rocephin.  2-generalized weakness, deconditioning and ambulatory dysfunction -Physical therapy evaluated patient and is recommending ALF at discharge for further assistance with her care. -Patient in agreement.  3-Chronic combined systolic and diastolic CHF (congestive heart failure) (HCC) -Stable and compensated -Continue Coreg and Entresto -Advised to maintain adequate hydration.  4-Essential hypertension -Stable and well-controlled -Continue current antihypertensive regimen -Advised to follow low-sodium  diet.  5-Chronic pain -Resume home analgesic regimen -Patient reports adequate control.  6-COPD (chronic obstructive pulmonary disease) (HCC) -Without acute exacerbation currently -Continue as needed bronchodilators and the use of Breo Ellipta and Incruse Ellipta  7-history of CAD S/P percutaneous coronary angioplasty -Continue Zetia, Lipitor and aspirin -Patient also on Coreg. -Reports no chest pain.  8-gastroesophageal flux disease -Continue PPI.  9-chronic hypoxic respiratory failure -Stable and currently no complaining of shortness of breath -Patient reports the use of 2 to 3 L as needed as an outpatient. -Currently with good saturation on room air.   DVT prophylaxis: Heparin Family Communication: No family member at bedside.  Patient is specified that she will contact family with updates. Disposition:   Status is: inpatient  Dispo: The patient is from: Home alone              Anticipated d/c is to: To be determined; most likely ALF.              Patient currently currently no medically stable for discharge; patient still complaining of increased frequency and demonstrating generalized weakness and deconditioning.  Physical therapy recommending ALF.   Difficult to place patient no    Consultants:   None   Procedures:  See below for x-ray reports.   Antimicrobials:  Rocephin.   Subjective: No fever, no chest pain, no nausea or vomiting.  Reporting urinary frequency and generalized weakness/fatigue.  Objective: Vitals:   10/09/20 0535 10/09/20 0802 10/09/20 0803 10/09/20 0950  BP: 120/63   130/68  Pulse: 74   83  Resp: 16     Temp: 98.1 F (36.7 C)     TempSrc:      SpO2: 98% 94% 94% 96%  Weight:      Height:  Intake/Output Summary (Last 24 hours) at 10/09/2020 1347 Last data filed at 10/09/2020 0900 Gross per 24 hour  Intake 840 ml  Output 450 ml  Net 390 ml   Filed Weights   10/08/20 1254  Weight: 76 kg    Examination:  General  exam: Appears calm and comfortable, patient cooperative to examination, no complaining of chest pain, shortness of breath, nausea or vomiting.  Reports no dysuria but continue complaining of increased frequency. Respiratory system: Clear to auscultation. Respiratory effort normal.  Good oxygen saturation on room air. Cardiovascular system: S1 & S2 heard, sinus rhythm; no rubs, no gallops, no JVD on exam. Gastrointestinal system: Abdomen is nondistended, soft and nontender. No organomegaly or masses felt. Normal bowel sounds heard. Central nervous system: Alert and oriented.  Moving 4 limbs spontaneously; demonstrating decreased range of motion in her left upper extremity.  Slight decrease lower extremity strength in the setting of deconditioning appreciated. Extremities: No cyanosis or clubbing. Skin: No petechiae. Psychiatry: Judgement and insight appear normal. Mood & affect appropriate.     Data Reviewed: I have personally reviewed following labs and imaging studies  CBC: Recent Labs  Lab 10/08/20 1346 10/09/20 0406  WBC 7.2 7.3  NEUTROABS 4.0  --   HGB 10.2* 10.1*  HCT 32.6* 32.9*  MCV 81.9 82.0  PLT 225 324    Basic Metabolic Panel: Recent Labs  Lab 10/08/20 1346 10/09/20 0406  NA 134* 136  K 4.1 3.4*  CL 102 104  CO2 28 31  GLUCOSE 105* 102*  BUN 13 11  CREATININE 0.79 0.63  CALCIUM 8.7* 8.7*    GFR: Estimated Creatinine Clearance: 50.9 mL/min (by C-G formula based on SCr of 0.63 mg/dL).  Liver Function Tests: Recent Labs  Lab 10/08/20 1346  AST 14*  ALT 12  ALKPHOS 67  BILITOT 1.0  PROT 6.1*  ALBUMIN 3.2*    CBG: No results for input(s): GLUCAP in the last 168 hours.   Recent Results (from the past 240 hour(s))  Resp Panel by RT-PCR (Flu A&B, Covid) Urine, Clean Catch     Status: None   Collection Time: 10/08/20  1:17 PM   Specimen: Urine, Clean Catch; Nasopharyngeal(NP) swabs in vial transport medium  Result Value Ref Range Status   SARS  Coronavirus 2 by RT PCR NEGATIVE NEGATIVE Final    Comment: (NOTE) SARS-CoV-2 target nucleic acids are NOT DETECTED.  The SARS-CoV-2 RNA is generally detectable in upper respiratory specimens during the acute phase of infection. The lowest concentration of SARS-CoV-2 viral copies this assay can detect is 138 copies/mL. A negative result does not preclude SARS-Cov-2 infection and should not be used as the sole basis for treatment or other patient management decisions. A negative result may occur with  improper specimen collection/handling, submission of specimen other than nasopharyngeal swab, presence of viral mutation(s) within the areas targeted by this assay, and inadequate number of viral copies(<138 copies/mL). A negative result must be combined with clinical observations, patient history, and epidemiological information. The expected result is Negative.  Fact Sheet for Patients:  EntrepreneurPulse.com.au  Fact Sheet for Healthcare Providers:  IncredibleEmployment.be  This test is no t yet approved or cleared by the [place] FDA and  has been authorized for detection and/or diagnosis of SARS-CoV-2 by FDA under an Emergency Use Authorization (EUA). This EUA will remain  in effect (meaning this test can be used) for the duration of the COVID-19 declaration under Section 564(b)(1) of the Act, 21 U.S.C.section 360bbb-3(b)(1), unless the  authorization is terminated  or revoked sooner.       Influenza A by PCR NEGATIVE NEGATIVE Final   Influenza B by PCR NEGATIVE NEGATIVE Final    Comment: (NOTE) The Xpert Xpress SARS-CoV-2/FLU/RSV plus assay is intended as an aid in the diagnosis of influenza from Nasopharyngeal swab specimens and should not be used as a sole basis for treatment. Nasal washings and aspirates are unacceptable for Xpert Xpress SARS-CoV-2/FLU/RSV testing.  Fact Sheet for  Patients: EntrepreneurPulse.com.au  Fact Sheet for Healthcare Providers: IncredibleEmployment.be  This test is not yet approved or cleared by the [place] FDA and has been authorized for detection and/or diagnosis of SARS-CoV-2 by FDA under an Emergency Use Authorization (EUA). This EUA will remain in effect (meaning this test can be used) for the duration of the COVID-19 declaration under Section 564(b)(1) of the Act, 21 U.S.C. section 360bbb-3(b)(1), unless the authorization is terminated or revoked.  Performed at [facility], [address], [place], [place] [address]      Radiology Studies: DG Chest Portable 1 View  Result Date: 10/08/2020 CLINICAL DATA:  PT c/o weakness and fatigue for several days. Denies cp, fever, sob, or N/V. HX of CHF and hypertension. EXAM: PORTABLE CHEST 1 VIEW COMPARISON:  04/20/2020. FINDINGS: Mild stable enlargement of the cardiopericardial silhouette. No mediastinal or hilar masses. Lungs are hyperexpanded, but clear. No pleural effusion or pneumothorax. Stable changes from cervical spine fusion. Stable left shoulder arthroplasty. Skeletal structures are diffusely demineralized. IMPRESSION: No acute cardiopulmonary disease. Electronically Signed   By: Lajean Manes M.D.   On: 10/08/2020 13:47    Scheduled Meds: . aspirin EC  81 mg Oral Daily  . atorvastatin  80 mg Oral QHS  . carvedilol  12.5 mg Oral BID  . ezetimibe  10 mg Oral Daily  . fluticasone  1 spray Each Nare Daily  . fluticasone furoate-vilanterol  1 puff Inhalation Daily   And  . umeclidinium bromide  1 puff Inhalation Daily  . heparin  5,000 Units Subcutaneous Q8H  . leflunomide  20 mg Oral Daily  . loratadine  10 mg Oral QPM  . meloxicam  7.5 mg Oral Daily  . montelukast  10 mg Oral QHS  . pantoprazole  40 mg Oral Daily  . sacubitril-valsartan  1 tablet Oral BID  . sodium chloride flush  3 mL Intravenous Q12H  . sodium chloride flush  3 mL  Intravenous Q12H   Continuous Infusions: . sodium chloride    . cefTRIAXone (ROCEPHIN)  IV       LOS: 0 days    Time spent: 30 minutes   Barton Dubois, MD [facility]   To contact the attending provider between 7A-7P or the covering provider during after hours 7P-7A, please log into the web site [email or web address] and access using universal [place] password for that web site. If you do not have the password, please call the hospital operator.  10/09/2020, 1:47 PM

## 2020-10-09 NOTE — Care Management Obs Status (Signed)
[place] NOTIFICATION   Patient Details  Name: [name] MRN: [id number] Date of Birth: 09/14/1935   Medicare Observation Status Notification Given:  Yes    Natasha Bence, [place] 10/09/2020, 5:10 PM

## 2020-10-09 NOTE — NC FL2 (Signed)
[place] MEDICAID FL2 LEVEL OF CARE SCREENING TOOL     IDENTIFICATION  Patient Name: [name] Birthdate: [birth year] Sex: female Admission Date (Current Location): 10/08/2020  [place] and [place] Number:  [place] [facility] [id number] [place] and Address:  [facility] [address] [place]      Provider Number: [id number]  Attending Physician Name and Address:  Barton Dubois, MD  Relative Name and Phone Number:  Gae Dry (Daughter)   [phone]    Current Level of Care: SNF Recommended Level of Care: [place] Prior Approval Number:    Date Approved/Denied: 11/07/19 PASRR Number: [id number]  Discharge Plan: SNF    Current Diagnoses: Patient Active Problem List   Diagnosis Date Noted  . UTI (urinary tract infection) 10/08/2020  . Generalized weakness related to UTI 10/08/2020  . Dysuria 07/20/2020  . Meningioma ([place]) 07/20/2020  . Left breast mass 05/27/2020  . Impaired fasting glucose 04/27/2020  . Cognitive impairment 04/27/2020  . Urinary incontinence 04/27/2020  . Chronic stable angina ([place]) 04/24/2018  . Physical deconditioning 04/24/2018  . CAD (coronary artery disease) 02/03/2018  . Ischemic cardiomyopathy 11/25/2017  . COPD (chronic obstructive pulmonary disease) ([place]) 04/01/2017  . CAD S/P percutaneous coronary angioplasty 04/01/2017  . NSTEMI (non-ST elevated myocardial infarction) ([place]) 11/23/2016  . Hypertensive cardiovascular disease 10/03/2015  . Protein-calorie malnutrition, severe 07/13/2015  . Demand ischemia of myocardium ([place]) 07/13/2015  . Nonischemic cardiomyopathy ([place])   . Left bundle branch block   . GERD (gastroesophageal reflux disease) 07/11/2015  . Ascending aortic aneurysm ([place]) 01/27/2015  . Chronic combined systolic and diastolic CHF (congestive heart failure) ([place]) 01/27/2015  . Essential hypertension 01/27/2015  . Anxiety 01/27/2015  . Chronic pain 01/27/2015  .  Liver mass 05/21/2011  . SHOULDER PAIN 01/19/2009    Orientation RESPIRATION BLADDER Height & Weight     Self,Time,Situation,Place  Normal Continent Weight: 167 lb 8.8 oz (76 kg) Height:  5\' 2"  (157.5 cm)  BEHAVIORAL SYMPTOMS/MOOD NEUROLOGICAL BOWEL NUTRITION STATUS      Continent Diet  AMBULATORY STATUS COMMUNICATION OF NEEDS Skin   Limited Assist Verbally Other (Comment) (Back scratches)                       Personal Care Assistance Level of Assistance  Bathing,Feeding,Dressing Bathing Assistance: Limited assistance Feeding assistance: Limited assistance Dressing Assistance: Limited assistance     Functional Limitations Info  Sight,Hearing,Speech Sight Info: Adequate Hearing Info: Adequate Speech Info: Adequate    SPECIAL CARE FACTORS FREQUENCY  PT (By licensed PT)     PT Frequency: 5x              Contractures Contractures Info: Not present    Additional Factors Info  Code Status,Allergies Code Status Info: Full Allergies Info: Plavix (clopidogrel Bisulfate), Alprazolam, Codeine, Percodan (oxycodone-aspirin), Valium           Current Medications (10/09/2020):  This is the current hospital active medication list Current Facility-Administered Medications  Medication Dose Route Frequency Provider Last Rate Last Admin  . 0.9 %  sodium chloride infusion  250 mL Intravenous PRN Emokpae, Courage, MD      . acetaminophen (TYLENOL) tablet 650 mg  650 mg Oral Q6H PRN Emokpae, Courage, MD       Or  . acetaminophen (TYLENOL) suppository 650 mg  650 mg Rectal Q6H PRN Emokpae, Courage, MD      . albuterol (PROVENTIL) (2.5 MG/3ML) 0.083% nebulizer solution 2.5 mg  2.5  mg Nebulization Q6H PRN Emokpae, Courage, MD      . aspirin EC tablet 81 mg  81 mg Oral Daily Emokpae, Courage, MD   81 mg at 10/09/20 0952  . atorvastatin (LIPITOR) tablet 80 mg  80 mg Oral QHS Emokpae, Courage, MD   80 mg at 10/08/20 2249  . bisacodyl (DULCOLAX) suppository 10 mg  10 mg Rectal  Daily PRN Emokpae, Courage, MD      . carvedilol (COREG) tablet 12.5 mg  12.5 mg Oral BID [name], Courage, MD   12.5 mg at 10/09/20 0951  . cefTRIAXone (ROCEPHIN) 1 g in sodium chloride 0.9 % 100 mL IVPB  1 g Intravenous Q24H Emokpae, Courage, MD 200 mL/hr at 10/09/20 1602 1 g at 10/09/20 1602  . ezetimibe (ZETIA) tablet 10 mg  10 mg Oral Daily [name], Courage, MD   10 mg at 10/09/20 0952  . fluticasone (FLONASE) 50 MCG/ACT nasal spray 1 spray  1 spray Each Nare Daily Emokpae, Courage, MD      . fluticasone furoate-vilanterol (BREO ELLIPTA) 100-25 MCG/INH 1 puff  1 puff Inhalation Daily Emokpae, Courage, MD   1 puff at 10/09/20 0802   And  . umeclidinium bromide (INCRUSE ELLIPTA) 62.5 MCG/INH 1 puff  1 puff Inhalation Daily Roxan Hockey, MD   1 puff at 10/09/20 0802  . heparin injection 5,000 Units  5,000 Units Subcutaneous Q8H Roxan Hockey, MD   5,000 Units at 10/09/20 1609  . leflunomide (ARAVA) tablet 20 mg  20 mg Oral Daily Emokpae, Courage, MD   20 mg at 10/09/20 1002  . loratadine (CLARITIN) tablet 10 mg  10 mg Oral QPM Emokpae, Courage, MD      . meloxicam (MOBIC) tablet 7.5 mg  7.5 mg Oral Daily Emokpae, Courage, MD   7.5 mg at 10/09/20 0950  . montelukast (SINGULAIR) tablet 10 mg  10 mg Oral QHS Emokpae, Courage, MD   10 mg at 10/08/20 2249  . nitroGLYCERIN (NITROSTAT) SL tablet 0.4 mg  0.4 mg Sublingual Q5 Min x 3 PRN Emokpae, Courage, MD      . ondansetron (ZOFRAN) tablet 4 mg  4 mg Oral Q6H PRN Emokpae, Courage, MD       Or  . ondansetron (ZOFRAN) injection 4 mg  4 mg Intravenous Q6H PRN Emokpae, Courage, MD      . pantoprazole (PROTONIX) EC tablet 40 mg  40 mg Oral Daily Emokpae, Courage, MD   40 mg at 10/09/20 0951  . polyethylene glycol (MIRALAX / GLYCOLAX) packet 17 g  17 g Oral Daily PRN Emokpae, Courage, MD      . sacubitril-valsartan (ENTRESTO) 24-26 mg per tablet  1 tablet Oral BID Roxan Hockey, MD   1 tablet at 10/09/20 0951  . sodium chloride flush (NS) 0.9 %  injection 3 mL  3 mL Intravenous Q12H Emokpae, Courage, MD   3 mL at 10/09/20 0953  . sodium chloride flush (NS) 0.9 % injection 3 mL  3 mL Intravenous Q12H Emokpae, Courage, MD   3 mL at 10/09/20 0953  . sodium chloride flush (NS) 0.9 % injection 3 mL  3 mL Intravenous PRN Emokpae, Courage, MD      . traMADol (ULTRAM) tablet 50 mg  50 mg Oral Daily PRN Emokpae, Courage, MD      . traZODone (DESYREL) tablet 50 mg  50 mg Oral QHS PRN Roxan Hockey, MD         Discharge Medications: Please see discharge summary for a list of  discharge medications.  Relevant Imaging Results:  Relevant Lab Results:   Additional Information PT SSN: [id number]  Natasha Bence, LCSW

## 2020-10-10 DIAGNOSIS — R531 Weakness: Secondary | ICD-10-CM | POA: Diagnosis not present

## 2020-10-10 DIAGNOSIS — N3000 Acute cystitis without hematuria: Secondary | ICD-10-CM | POA: Diagnosis not present

## 2020-10-10 DIAGNOSIS — G894 Chronic pain syndrome: Secondary | ICD-10-CM | POA: Diagnosis not present

## 2020-10-10 DIAGNOSIS — I5042 Chronic combined systolic (congestive) and diastolic (congestive) heart failure: Secondary | ICD-10-CM | POA: Diagnosis not present

## 2020-10-10 MED ORDER — CLORAZEPATE DIPOTASSIUM 7.5 MG PO TABS: 7.5000 mg | ORAL_TABLET | Freq: Every day | ORAL | Status: DC | PRN

## 2020-10-10 MED ORDER — SODIUM CHLORIDE 0.9 % IV SOLN: INTRAVENOUS | Status: AC

## 2020-10-10 NOTE — Progress Notes (Signed)
Physical Therapy Treatment Patient Details Name: [name] MRN: [id number] DOB: 1936/02/26 Today's Date: 10/10/2020    History of Present Illness [name]  is a 85 y.o. female who is a reformed smoker with past medical history relevant for history of meningioma with ambulatory dysfunction and generalized weakness in the setting of arthralgia/arthritis, GERD, history of combined systolic and diastolic dysfunction CHF, COPD, chronic hypoxic respiratory failure uses 2 L of oxygen, GERD, history of CAD with prior angioplasty and stent placement, HTN presents to the ED with worsening weakness and inability to ambulate with concerns for fall risk, patient reports shaking chills urinary frequency    PT Comments    Patient presents supine in bed with HOB elevated. Patient is awake, alert and cooperative. Patient performs bed mobility Mod I. Educated patient on and performed LE strengthening exercises seated at EOB. Patient shows good standing balance and was able to ambulate 175 in hallway using RW. Patient notes minimal fatigue, tolerated activity very well overall. Patient returned to room and sat upright in chair with phone and call bell in reach. Patient will benefit from continued physical therapy in hospital and recommended venue below to increase strength, balance, endurance for safe ADLs and gait.       Follow Up Recommendations  Supervision for mobility/OOB;Other (comment) (ALF)     Equipment Recommendations  None recommended by PT    Recommendations for Other Services       Precautions / Restrictions Precautions Precautions: Fall Restrictions Weight Bearing Restrictions: No    Mobility  Bed Mobility Overal bed mobility: Modified Independent             General bed mobility comments: slow, uses UEs    Transfers Overall transfer level: Modified independent Equipment used: Rolling walker (2 wheeled) Transfers: Sit to/from [facility] Sit to Stand:  Modified independent (Device/Increase time)         General transfer comment: slow movement, use of UEs, good return for hand placement  Ambulation/Gait Ambulation/Gait assistance: Supervision;Modified independent (Device/Increase time) Gait Distance (Feet): 175 Feet Assistive device: Rolling walker (2 wheeled) Gait Pattern/deviations: Decreased step length - right;Decreased step length - left;Decreased stride length Gait velocity: decreased   General Gait Details: Slightly flexed trunk   Stairs             Wheelchair Mobility    Modified Rankin (Stroke Patients Only)       Balance Overall balance assessment: Needs assistance Sitting-balance support: Feet supported;No upper extremity supported Sitting balance-Leahy Scale: Good Sitting balance - Comments: seated EOB   Standing balance support: Bilateral upper extremity supported Standing balance-Leahy Scale: Good Standing balance comment: No UE support standing, UE support using RW for ambulation                            Cognition Arousal/Alertness: Awake/alert Behavior During Therapy: WFL for tasks assessed/performed Overall Cognitive Status: Within Functional Limits for tasks assessed                                        Exercises General Exercises - Lower Extremity Ankle Circles/Pumps: Both;Seated;10 reps Long Arc Quad: Both;Seated;10 reps Hip Flexion/Marching: Both;10 reps;Seated    General Comments        Pertinent Vitals/Pain Pain Assessment: No/denies pain    Home Living  Prior Function            PT Goals (current goals can now be found in the care plan section) Acute Rehab PT Goals Patient Stated Goal: Return home PT Goal Formulation: With patient Time For Goal Achievement: 10/23/20 Potential to Achieve Goals: Good    Frequency    Min 3X/week      PT Plan      Co-evaluation              AM-PAC PT "6  Clicks" Mobility   Outcome Measure  Help needed turning from your back to your side while in a flat bed without using bedrails?: None Help needed moving from lying on your back to sitting on the side of a flat bed without using bedrails?: A Little Help needed moving to and from a bed to a chair (including a wheelchair)?: None Help needed standing up from a chair using your arms (e.g., wheelchair or bedside chair)?: None Help needed to walk in hospital room?: A Little Help needed climbing 3-5 steps with a railing? : A Lot 6 Click Score: 20    End of Session Equipment Utilized During Treatment: Gait belt Activity Tolerance: Patient tolerated treatment well Patient left: with call bell/phone within reach;in chair Nurse Communication: Mobility status PT Visit Diagnosis: Unsteadiness on feet (R26.81);Other abnormalities of gait and mobility (R26.89)     Time: 6644-0347 PT Time Calculation (min) (ACUTE ONLY): 25 min  Charges:  $Gait Training: 8-22 mins $Therapeutic Exercise: 8-22 mins                     1:35 PM, 10/10/20 Josue Hector PT DPT  Physical Therapist with [facility]  [phone]

## 2020-10-10 NOTE — Progress Notes (Addendum)
PROGRESS NOTE    [name]  QAS:[id number] DOB: [birth year] DOA: 10/08/2020 PCP: [name], NP   Chief Complaint  Patient presents with  . Weakness    Weakness    Brief admission narrative:  As per H&P written by Dr. [name] on 10/08/2020  [name]  is a 85 y.o. female who is a reformed smoker with past medical history relevant for history of meningioma with ambulatory dysfunction and generalized weakness in the setting of arthralgia/arthritis, GERD, history of combined systolic and diastolic dysfunction CHF, COPD, chronic hypoxic respiratory failure uses 2 L of oxygen, GERD, history of CAD with prior angioplasty and stent placement, HTN presents to the ED with worsening weakness and inability to ambulate with concerns for fall risk, patient reports shaking chills urinary frequency -No flank pain no vomiting no diarrhea -No chest pains no palpitations no pleuritic symptoms -No increased dyspnea no leg pains or leg swelling -In the ED patient is found to have low-grade fevers,  -COVID test negative -Chest x-ray without acute cardiopulmonary findings -Troponin is not elevated -WBC 7.2 hemoglobin 10.2 which is patient's baseline -Sodium is 134 creatinine 0.79, UA suggestive of UTI urine culture pending   Assessment & Plan: 1-UTI (urinary tract infection) -Cultures pending -Reporting increased frequency; no frank dysuria, fever, nausea or vomiting. -Continue Rocephin.  2-generalized weakness, deconditioning and ambulatory dysfunction -Physical therapy evaluated patient and is recommending ALF at discharge for further assistance with her care. -Patient in agreement.  3-Chronic combined systolic and diastolic CHF (congestive heart failure) (HCC) -Stable and compensated -Continue Coreg and Entresto -Advised to maintain adequate hydration.  4-Essential hypertension -Stable and well-controlled -Continue current antihypertensive regimen -Advised to follow low-sodium  diet.  5-Chronic pain -Resume home analgesic regimen -Patient reports adequate control.  6-COPD (chronic obstructive pulmonary disease) (HCC) -Without acute exacerbation currently -Continue as needed bronchodilators and the use of Breo Ellipta and Incruse Ellipta  7-history of CAD S/P percutaneous coronary angioplasty -Continue Zetia, Lipitor and aspirin -Patient also on Coreg. -Reports no chest pain.  8-gastroesophageal flux disease -Continue PPI.  9-chronic hypoxic respiratory failure -Stable and currently no complaining of shortness of breath -Patient reports the use of 2 to 3 L as needed as an outpatient. -Currently with good saturation on room air.   DVT prophylaxis: Heparin Family Communication: No family member at bedside.  Patient is specified that she will contact family with updates. Disposition:   Status is: inpatient  Dispo: The patient is from: Home alone              Anticipated d/c is to: To be determined; most likely ALF.              Patient currently currently no medically stable for discharge; patient still complaining of increased frequency and demonstrating generalized weakness and deconditioning.  Physical therapy recommending ALF.   Difficult to place patient no    Consultants:   None   Procedures:  See below for x-ray reports.   Antimicrobials:  Rocephin.   Subjective: No fever, no chest pain, no nausea or vomiting.  Reporting urinary frequency and generalized weakness/fatigue.  Objective: Vitals:   10/09/20 2044 10/10/20 0531 10/10/20 0839 10/10/20 1401  BP: (!) 128/49 121/77 116/61 128/74  Pulse: 78 70 82 69  Resp: 15 16  16   Temp: 98.4 F (36.9 C) (!) 97.4 F (36.3 C)  98.8 F (37.1 C)  TempSrc: Oral Oral  Oral  SpO2: 96% 97%  96%  Weight:  Height:        Intake/Output Summary (Last 24 hours) at 10/10/2020 1603 Last data filed at 10/10/2020 1200 Gross per 24 hour  Intake 1297.88 ml  Output 650 ml  Net 647.88 ml    Filed Weights   10/08/20 1254  Weight: 76 kg    Examination:  General exam: Appears calm and comfortable, patient cooperative to examination, no complaining of chest pain, shortness of breath, nausea or vomiting.  Reports no dysuria but continue complaining of increased frequency. Respiratory system: Clear to auscultation. Respiratory effort normal.  Good oxygen saturation on room air. Cardiovascular system: S1 & S2 heard, sinus rhythm; no rubs, no gallops, no JVD on exam. Gastrointestinal system: Abdomen is nondistended, soft and nontender. No organomegaly or masses felt. Normal bowel sounds heard. Central nervous system: Alert and oriented.  Moving 4 limbs spontaneously; demonstrating decreased range of motion in her left upper extremity.  Slight decrease lower extremity strength in the setting of deconditioning appreciated. Extremities: No cyanosis or clubbing. Skin: No petechiae. Psychiatry: Judgement and insight appear normal. Mood & affect appropriate.     Data Reviewed: I have personally reviewed following labs and imaging studies  CBC: Recent Labs  Lab 10/08/20 1346 10/09/20 0406  WBC 7.2 7.3  NEUTROABS 4.0  --   HGB 10.2* 10.1*  HCT 32.6* 32.9*  MCV 81.9 82.0  PLT 225 657    Basic Metabolic Panel: Recent Labs  Lab 10/08/20 1346 10/09/20 0406  NA 134* 136  K 4.1 3.4*  CL 102 104  CO2 28 31  GLUCOSE 105* 102*  BUN 13 11  CREATININE 0.79 0.63  CALCIUM 8.7* 8.7*    GFR: Estimated Creatinine Clearance: 50.9 mL/min (by C-G formula based on SCr of 0.63 mg/dL).  Liver Function Tests: Recent Labs  Lab 10/08/20 1346  AST 14*  ALT 12  ALKPHOS 67  BILITOT 1.0  PROT 6.1*  ALBUMIN 3.2*    CBG: No results for input(s): GLUCAP in the last 168 hours.   Recent Results (from the past 240 hour(s))  Urine culture     Status: Abnormal (Preliminary result)   Collection Time: 10/08/20  1:13 PM   Specimen: Urine, Clean Catch  Result Value Ref Range Status    Specimen Description   Final    URINE, CLEAN CATCH Performed at [facility], [address] [place], [place] [address]    Special Requests   Final    NONE Performed at [facility], [address], [place], [place] [address]    Culture >=100,000 COLONIES/mL ESCHERICHIA COLI (A)  Final   Report Status PENDING  Incomplete  Resp Panel by RT-PCR (Flu A&B, Covid) Urine, Clean Catch     Status: None   Collection Time: 10/08/20  1:17 PM   Specimen: Urine, Clean Catch; Nasopharyngeal(NP) swabs in vial transport medium  Result Value Ref Range Status   SARS Coronavirus 2 by RT PCR NEGATIVE NEGATIVE Final    Comment: (NOTE) SARS-CoV-2 target nucleic acids are NOT DETECTED.  The SARS-CoV-2 RNA is generally detectable in upper respiratory specimens during the acute phase of infection. The lowest concentration of SARS-CoV-2 viral copies this assay can detect is 138 copies/mL. A negative result does not preclude SARS-Cov-2 infection and should not be used as the sole basis for treatment or other patient management decisions. A negative result may occur with  improper specimen collection/handling, submission of specimen other than nasopharyngeal swab, presence of viral mutation(s) within the areas targeted by this assay, and inadequate  number of viral copies(<138 copies/mL). A negative result must be combined with clinical observations, patient history, and epidemiological information. The expected result is Negative.  Fact Sheet for Patients:  EntrepreneurPulse.com.au  Fact Sheet for Healthcare Providers:  IncredibleEmployment.be  This test is no t yet approved or cleared by the [place] FDA and  has been authorized for detection and/or diagnosis of SARS-CoV-2 by FDA under an Emergency Use Authorization (EUA). This EUA will remain  in effect (meaning this test can be used) for the duration of the COVID-19 declaration under Section 564(b)(1) of the  Act, 21 U.S.C.section 360bbb-3(b)(1), unless the authorization is terminated  or revoked sooner.       Influenza A by PCR NEGATIVE NEGATIVE Final   Influenza B by PCR NEGATIVE NEGATIVE Final    Comment: (NOTE) The Xpert Xpress SARS-CoV-2/FLU/RSV plus assay is intended as an aid in the diagnosis of influenza from Nasopharyngeal swab specimens and should not be used as a sole basis for treatment. Nasal washings and aspirates are unacceptable for Xpert Xpress SARS-CoV-2/FLU/RSV testing.  Fact Sheet for Patients: EntrepreneurPulse.com.au  Fact Sheet for Healthcare Providers: IncredibleEmployment.be  This test is not yet approved or cleared by the [place] FDA and has been authorized for detection and/or diagnosis of SARS-CoV-2 by FDA under an Emergency Use Authorization (EUA). This EUA will remain in effect (meaning this test can be used) for the duration of the COVID-19 declaration under Section 564(b)(1) of the Act, 21 U.S.C. section 360bbb-3(b)(1), unless the authorization is terminated or revoked.  Performed at [facility], [address] [place], [place] [address]      Radiology Studies: No results found.  Scheduled Meds: . aspirin EC  81 mg Oral Daily  . atorvastatin  80 mg Oral QHS  . carvedilol  12.5 mg Oral BID  . ezetimibe  10 mg Oral Daily  . fluticasone  1 spray Each Nare Daily  . fluticasone furoate-vilanterol  1 puff Inhalation Daily   And  . umeclidinium bromide  1 puff Inhalation Daily  . heparin  5,000 Units Subcutaneous Q8H  . leflunomide  20 mg Oral Daily  . loratadine  10 mg Oral QPM  . meloxicam  7.5 mg Oral Daily  . montelukast  10 mg Oral QHS  . pantoprazole  40 mg Oral Daily  . sacubitril-valsartan  1 tablet Oral BID  . sodium chloride flush  3 mL Intravenous Q12H  . sodium chloride flush  3 mL Intravenous Q12H   Continuous Infusions: . sodium chloride    . cefTRIAXone (ROCEPHIN)  IV 1 g (10/10/20  1502)     LOS: 0 days    Time spent: 30 minutes   Barton Dubois, MD [facility]   To contact the attending provider between 7A-7P or the covering provider during after hours 7P-7A, please log into the web site [email or web address] and access using universal [place] password for that web site. If you do not have the password, please call the hospital operator.  10/10/2020, 4:03 PM    PROGRESS NOTE    [name]  JWJ:[id number] DOB: 1936-03-01 DOA: 10/08/2020 PCP: [name], NP   Chief Complaint  Patient presents with  . Weakness    Weakness    Brief admission narrative:  As per H&P written by Dr. [name] on 10/08/2020  Carrisa Keller  is a 85 y.o. female who is a reformed smoker with past medical history relevant for history of meningioma with ambulatory dysfunction and generalized weakness in  the setting of arthralgia/arthritis, GERD, history of combined systolic and diastolic dysfunction CHF, COPD, chronic hypoxic respiratory failure uses 2 L of oxygen, GERD, history of CAD with prior angioplasty and stent placement, HTN presents to the ED with worsening weakness and inability to ambulate with concerns for fall risk, patient reports shaking chills urinary frequency -No flank pain no vomiting no diarrhea -No chest pains no palpitations no pleuritic symptoms -No increased dyspnea no leg pains or leg swelling -In the ED patient is found to have low-grade fevers,  -COVID test negative -Chest x-ray without acute cardiopulmonary findings -Troponin is not elevated -WBC 7.2 hemoglobin 10.2 which is patient's baseline -Sodium is 134 creatinine 0.79, UA suggestive of UTI urine culture pending   Assessment & Plan: 1-E. coli UTI (urinary tract infection) -Sensitivity/final culture results pending -Continue reporting increased frequency, now with mild nausea and decreased oral intake; no frank dysuria. -Continue Rocephin and provide gentle fluid resuscitation  overnight..  2-generalized weakness, deconditioning and ambulatory dysfunction -Physical therapy evaluated patient and is recommending ALF at discharge for further assistance with her care. -Patient in agreement with plan.  3-Chronic combined systolic and diastolic CHF (congestive heart failure) (HCC) -Stable and compensated -Continue Coreg and Entresto -Advised to maintain adequate hydration; will closely monitor patient volume status after providing gentle/judicious use fluid resuscitation overnight.  4-Essential hypertension -Stable and well-controlled -Continue current antihypertensive regimen -Advised to follow low-sodium diet.  5-Chronic pain/anxiety -Resume home analgesic regimen -Patient reports adequate control currently. -Resume chronic as needed Tranxene  6-COPD (chronic obstructive pulmonary disease) (HCC) -Without acute exacerbation currently -Continue as needed bronchodilators and the use of Breo Ellipta and Incruse Ellipta -No wheezing on exam.  7-history of CAD S/P percutaneous coronary angioplasty -Continue Zetia, Lipitor and aspirin -Patient also on Coreg. -Reports no chest pain.  8-gastroesophageal flux disease -Continue PPI.  9-chronic hypoxic respiratory failure -Stable and currently no complaining of shortness of breath -Patient reports the use of 2 to 3 L as needed as an outpatient. -Currently with good saturation on room air.   DVT prophylaxis: Heparin Family Communication: No family member at bedside.  Patient is specified that she will contact family with updates. Disposition:   Status is: Inpatient  Dispo: The patient is from: Home alone              Anticipated d/c is to: To be determined; most likely ALF.              Patient currently currently no medically stable for discharge; patient still complaining of increased frequency and demonstrating generalized weakness and deconditioning.  Physical therapy recommending ALF.   Difficult to  place patient no    Consultants:   None   Procedures:  See below for x-ray reports.   Antimicrobials:  Rocephin.   Subjective: No fever, no chest pain, no shortness of breath.  Patient reports intermittent nausea, urinary frequency and feeling fatigued.  Decreased appetite and oral intake reported.  Objective: Vitals:   10/09/20 2044 10/10/20 0531 10/10/20 0839 10/10/20 1401  BP: (!) 128/49 121/77 116/61 128/74  Pulse: 78 70 82 69  Resp: 15 16  16   Temp: 98.4 F (36.9 C) (!) 97.4 F (36.3 C)  98.8 F (37.1 C)  TempSrc: Oral Oral  Oral  SpO2: 96% 97%  96%  Weight:      Height:        Intake/Output Summary (Last 24 hours) at 10/10/2020 1607 Last data filed at 10/10/2020 1200 Gross per 24 hour  Intake  1297.88 ml  Output 650 ml  Net 647.88 ml   Filed Weights   10/08/20 1254  Weight: 76 kg    Examination: General exam: Alert, awake, oriented x 3; expressing mild nausea and decreased oral intake.  Feeling weak and deconditioned.  No chest pain, fever or shortness of breath. Respiratory system: Clear to auscultation bilaterally; no using accessory muscles. Cardiovascular system: Rate controlled, no rubs, no gallops, no JVD on exam. Gastrointestinal system: Abdomen is nondistended, soft and nontender. No organomegaly or masses felt. Normal bowel sounds heard. Central nervous system: No focal neurological deficits. Extremities: No cyanosis or clubbing. Skin: No petechiae. Psychiatry: Judgement and insight appear normal.  Anxious mood and experiencing mild crying spells.  Data Reviewed: I have personally reviewed following labs and imaging studies  CBC: Recent Labs  Lab 10/08/20 1346 10/09/20 0406  WBC 7.2 7.3  NEUTROABS 4.0  --   HGB 10.2* 10.1*  HCT 32.6* 32.9*  MCV 81.9 82.0  PLT 225 818    Basic Metabolic Panel: Recent Labs  Lab 10/08/20 1346 10/09/20 0406  NA 134* 136  K 4.1 3.4*  CL 102 104  CO2 28 31  GLUCOSE 105* 102*  BUN 13 11   CREATININE 0.79 0.63  CALCIUM 8.7* 8.7*    GFR: Estimated Creatinine Clearance: 50.9 mL/min (by C-G formula based on SCr of 0.63 mg/dL).  Liver Function Tests: Recent Labs  Lab 10/08/20 1346  AST 14*  ALT 12  ALKPHOS 67  BILITOT 1.0  PROT 6.1*  ALBUMIN 3.2*    CBG: No results for input(s): GLUCAP in the last 168 hours.   Recent Results (from the past 240 hour(s))  Urine culture     Status: Abnormal (Preliminary result)   Collection Time: 10/08/20  1:13 PM   Specimen: Urine, Clean Catch  Result Value Ref Range Status   Specimen Description   Final    URINE, CLEAN CATCH Performed at [facility], [address], [place], [place] [address]    Special Requests   Final    NONE Performed at [facility], [address], [place],  [address]    Culture >=100,000 COLONIES/mL ESCHERICHIA COLI (A)  Final   Report Status PENDING  Incomplete  Resp Panel by RT-PCR (Flu A&B, Covid) Urine, Clean Catch     Status: None   Collection Time: 10/08/20  1:17 PM   Specimen: Urine, Clean Catch; Nasopharyngeal(NP) swabs in vial transport medium  Result Value Ref Range Status   SARS Coronavirus 2 by RT PCR NEGATIVE NEGATIVE Final    Comment: (NOTE) SARS-CoV-2 target nucleic acids are NOT DETECTED.  The SARS-CoV-2 RNA is generally detectable in upper respiratory specimens during the acute phase of infection. The lowest concentration of SARS-CoV-2 viral copies this assay can detect is 138 copies/mL. A negative result does not preclude SARS-Cov-2 infection and should not be used as the sole basis for treatment or other patient management decisions. A negative result may occur with  improper specimen collection/handling, submission of specimen other than nasopharyngeal swab, presence of viral mutation(s) within the areas targeted by this assay, and inadequate number of viral copies(<138 copies/mL). A negative result must be combined with clinical observations, patient history, and  epidemiological information. The expected result is Negative.  Fact Sheet for Patients:  EntrepreneurPulse.com.au  Fact Sheet for Healthcare Providers:  IncredibleEmployment.be  This test is no t yet approved or cleared by the [place] FDA and  has been authorized for detection and/or diagnosis of SARS-CoV-2 by  FDA under an Emergency Use Authorization (EUA). This EUA will remain  in effect (meaning this test can be used) for the duration of the COVID-19 declaration under Section 564(b)(1) of the Act, 21 U.S.C.section 360bbb-3(b)(1), unless the authorization is terminated  or revoked sooner.       Influenza A by PCR NEGATIVE NEGATIVE Final   Influenza B by PCR NEGATIVE NEGATIVE Final    Comment: (NOTE) The Xpert Xpress SARS-CoV-2/FLU/RSV plus assay is intended as an aid in the diagnosis of influenza from Nasopharyngeal swab specimens and should not be used as a sole basis for treatment. Nasal washings and aspirates are unacceptable for Xpert Xpress SARS-CoV-2/FLU/RSV testing.  Fact Sheet for Patients: EntrepreneurPulse.com.au  Fact Sheet for Healthcare Providers: IncredibleEmployment.be  This test is not yet approved or cleared by the [place] FDA and has been authorized for detection and/or diagnosis of SARS-CoV-2 by FDA under an Emergency Use Authorization (EUA). This EUA will remain in effect (meaning this test can be used) for the duration of the COVID-19 declaration under Section 564(b)(1) of the Act, 21 U.S.C. section 360bbb-3(b)(1), unless the authorization is terminated or revoked.  Performed at [facility], [address], [place], [place] [address]      Radiology Studies: No results found.  Scheduled Meds: . aspirin EC  81 mg Oral Daily  . atorvastatin  80 mg Oral QHS  . carvedilol  12.5 mg Oral BID  . ezetimibe  10 mg Oral Daily  . fluticasone  1 spray Each Nare Daily  .  fluticasone furoate-vilanterol  1 puff Inhalation Daily   And  . umeclidinium bromide  1 puff Inhalation Daily  . heparin  5,000 Units Subcutaneous Q8H  . leflunomide  20 mg Oral Daily  . loratadine  10 mg Oral QPM  . meloxicam  7.5 mg Oral Daily  . montelukast  10 mg Oral QHS  . pantoprazole  40 mg Oral Daily  . sacubitril-valsartan  1 tablet Oral BID  . sodium chloride flush  3 mL Intravenous Q12H  . sodium chloride flush  3 mL Intravenous Q12H   Continuous Infusions: . sodium chloride    . sodium chloride    . cefTRIAXone (ROCEPHIN)  IV 1 g (10/10/20 1502)     LOS: 0 days    Time spent: 30 minutes   Barton Dubois, MD [facility]   To contact the attending provider between 7A-7P or the covering provider during after hours 7P-7A, please log into the web site [email or web address] and access using universal [place] password for that web site. If you do not have the password, please call the hospital operator.  10/10/2020, 4:07 PM

## 2020-10-11 DIAGNOSIS — I5042 Chronic combined systolic (congestive) and diastolic (congestive) heart failure: Secondary | ICD-10-CM | POA: Diagnosis not present

## 2020-10-11 DIAGNOSIS — N3000 Acute cystitis without hematuria: Secondary | ICD-10-CM | POA: Diagnosis not present

## 2020-10-11 DIAGNOSIS — F419 Anxiety disorder, unspecified: Secondary | ICD-10-CM

## 2020-10-11 DIAGNOSIS — R531 Weakness: Secondary | ICD-10-CM | POA: Diagnosis not present

## 2020-10-11 LAB — URINE CULTURE: Culture: >=100000 — AB

## 2020-10-11 MED ORDER — CEFDINIR 300 MG PO CAPS
300.0000 mg | ORAL_CAPSULE | Freq: Two times a day (BID) | ORAL | Status: DC
  Administered 2020-10-11: 300 mg via ORAL
  Filled 2020-10-11: qty 1

## 2020-10-11 MED ORDER — CLORAZEPATE DIPOTASSIUM 7.5 MG PO TABS: 7.5000 mg | ORAL_TABLET | Freq: Every day | ORAL | 0 refills | Status: DC | PRN

## 2020-10-11 MED ORDER — CEFDINIR 300 MG PO CAPS: 300.0000 mg | ORAL_CAPSULE | Freq: Two times a day (BID) | ORAL | 0 refills | Status: AC

## 2020-10-11 MED ORDER — PANTOPRAZOLE SODIUM 40 MG PO TBEC: 40.0000 mg | DELAYED_RELEASE_TABLET | Freq: Every day | ORAL | 1 refills | Status: DC

## 2020-10-11 MED ORDER — TRAMADOL HCL 50 MG PO TABS: 50.0000 mg | ORAL_TABLET | Freq: Every day | ORAL | 0 refills | Status: DC | PRN

## 2020-10-11 MED ORDER — PANTOPRAZOLE SODIUM 40 MG PO TBEC: 40.0000 mg | DELAYED_RELEASE_TABLET | Freq: Every day | ORAL | Status: DC

## 2020-10-11 NOTE — Care Management Important Message (Signed)
Important Message  Patient Details  Name: [name] MRN: [id number] Date of Birth: 04-Nov-1935   Medicare Important Message Given:  Yes     Tommy Medal 10/11/2020, 11:41 AM

## 2020-10-11 NOTE — TOC Transition Note (Addendum)
Transition of Care [facility]) - CM/SW Discharge Note   Patient Details  Name: [name] MRN: [id number] Date of Birth: 11/09/1935  Transition of Care [facility]) CM/SW Contact:  Ihor Gully, LCSW Phone Number: 10/11/2020, 9:26 PM   Clinical Narrative:    Patient made the decision to go home with [facility] services. [facility] accepting of insurance. Granddaughter, Ebony Hail, notified. Marlowe Kays is at work and advised to speak to Ebony Hail [phone]). Ebony Hail states that patient's daughter will pick her up.    Final next level of care: Skilled Nursing Facility Barriers to Discharge: No Barriers Identified   Patient Goals and CMS Choice Patient states their goals for this hospitalization and ongoing recovery are:: Return home with SNF CMS Medicare.gov Compare Post Acute Care list provided to:: Patient Choice offered to / list presented to : Patient  Discharge Placement              Patient [facility]: [facility]) Patient to be transferred to facility by: message left for daughter connie Name of family member notified: connie, daughter Patient and family notified of of transfer: 10/11/20  Discharge Plan and Services     Post Acute Care Choice: NA                    HH Arranged: RN,PT,Nurse's [place] Work [employer] Agency: Microbiologist ([place]) Date [place]: 10/11/20 Time [place]: 1700 Representative spoke with at [place]: Mack ([place]) Interventions     Readmission Risk Interventions No flowsheet data found.

## 2020-10-11 NOTE — Discharge Summary (Addendum)
Physician Discharge Summary  [name] CNO:[id number] DOB: 1935-06-02 DOA: 10/08/2020  PCP: [name], NP  Admit date: 10/08/2020 Discharge date: 10/11/2020  Time spent: 35 minutes  Recommendations for Outpatient Follow-up:  1. Reassess patient for complete symptoms resolution after completing treatment for UTI 2. Repeat basic metabolic panel to follow twice renal function  Discharge Diagnoses:  Principal Problem:   UTI (urinary tract infection) Active Problems:   Chronic combined systolic and diastolic CHF (congestive heart failure) (HCC)   Essential hypertension   Chronic pain   COPD (chronic obstructive pulmonary disease) (HCC)   CAD S/P percutaneous coronary angioplasty   Meningioma (HCC)   Generalized weakness related to UTI Physical deconditioning.  Discharge Condition: Stable and improved.  Patient medically stable to go home with home health services for further care and rehabilitation.  Advised to follow-up with PCP in 2 weeks.  Code status: Full code  Diet recommendation: Heart healthy diet.  Filed Weights   10/08/20 1254  Weight: 76 kg    History of present illness:  As per H&P written by Dr. [name] on 10/08/2020 BettyPinnixis a64 y.o.femalewho is a reformed smoker with past medical history relevant for history of meningioma with ambulatory dysfunction and generalized weakness in the setting of arthralgia/arthritis, GERD,history of combined systolic and diastolic dysfunction CHF, COPD, chronic hypoxic respiratory failure uses 2 L of oxygen,GERD,history of CAD with prior angioplasty and stent placement, HTNpresents to the ED with worsening weakness and inability to ambulate with concerns for fall risk,patient reports shaking chills urinary frequency -No flank pain no vomiting no diarrhea -No chest pains no palpitations no pleuritic symptoms -No increased dyspnea no leg pains or leg swelling -In the ED patient is found to have low-grade fevers,   -COVID test negative -Chest x-ray without acute cardiopulmonary findings -Troponin is not elevated -WBC 7.2 hemoglobin 10.2 which is patient's baseline -Sodium is 134 creatinine 0.79, UA suggestive of UTI urine culture pending  Hospital Course:  1-E. coli UTI -Sensitivity/culture demonstrating resistance to ampicillin. -continue to maintain adequate hydration -4 more days of antibiotics pending.  2-generalized weakness, deconditioning and ambulatory dysfunction -Physical therapy evaluated patient and is recommending supervision and assistance 24/7; patient has initially accepted rehabilitation and conditioning at SNF; base on nursing home rules and regulation, patient will need to be in quarantine after admission and that is something patient has refused. She is now declining SNF and will like to go home instead.  -Patient is hemodynamically stable for discharge now.  3-Chronic combined systolic and diastolic CHF (congestive heart failure) (HCC) -Stable and compensated -Continue Coreg and Entresto -Low-sodium diet has been encouraged. -Continue to check daily weights and maintain adequate hydration.  4-Essential hypertension -Stable and well-controlled -Continue current antihypertensive regimen -Advised to follow low-sodium diet.  5-Chronic pain -Continue home analgesic regimen -Patient reports adequate control.  6-COPD (chronic obstructive pulmonary disease) (HCC) -Without acute exacerbation currently -Continue as needed bronchodilators and the use of Breo Ellipta and Incruse Ellipta. -Good air movement bilaterally, no wheezing, no use of accessory muscles appreciated.  7-history of CAD S/P percutaneous coronary angioplasty -Continue Zetia, Lipitor and aspirin -Patient also on Coreg. -Reports no chest pain, palpitations or SOB.Marland Kitchen  8-gastroesophageal flux disease -Continue PPI.  9-chronic hypoxic respiratory failure -Stable -Patient reports the use of 2 to 3 L as  needed and at bedtime as an outpatient. -Currently with good saturation on room air and not complaining of SOB.  10-anxiety -continue PRN Tranxene   Procedures:  See below for x-ray reports.  Consultations:  None   Discharge Exam: Vitals:   10/10/20 2027 10/11/20 0442  BP: (!) 154/64 (!) 126/53  Pulse: 77 77  Resp: 16 16  Temp: 98.1 F (36.7 C) 97.9 F (36.6 C)  SpO2: 99% 97%    General: Chest pain, no nausea, no vomiting, reports no shortness of breath.  Still feeling weak and with a slightly decrease in her balance; brain no acute distress and feeling ready to pursuit rehabilitation. Cardiovascular: S1 and S2, no rubs, no gallops, no JVD on exam. Respiratory: Good air movement bilaterally; no using accessory muscles.  No crackles and no wheezing on exam. Abdomen: Soft, nontender, distended, positive bowel sounds Extremities: No cyanosis or clubbing.  Discharge Instructions   Discharge Instructions    (HEART FAILURE PATIENTS) Call MD:  Anytime you have any of the following symptoms: 1) 3 pound weight gain in 24 hours or 5 pounds in 1 week 2) shortness of breath, with or without a dry hacking cough 3) swelling in the hands, feet or stomach 4) if you have to sleep on extra pillows at night in order to breathe.   Complete by: As directed    Diet - low sodium heart healthy   Complete by: As directed    Discharge instructions   Complete by: As directed    Maintain adequate hydration Take medications as prescribed Rehabilitation and conditioning as per skilled nursing facility physical therapy protocol. Follow heart healthy diet (less than 2.5 g of sodium daily). Check weight on daily basis.   Increase activity slowly   Complete by: As directed      Allergies as of 10/11/2020      Reactions   Plavix [clopidogrel Bisulfate] Itching   Severe itching   Alprazolam Nausea And Vomiting   Codeine Nausea And Vomiting   Percodan [oxycodone-aspirin] Nausea And Vomiting    Valium Nausea And Vomiting      Medication List    STOP taking these medications   ibuprofen 800 MG tablet Commonly known as: ADVIL     TAKE these medications   albuterol (2.5 MG/3ML) 0.083% nebulizer solution Commonly known as: PROVENTIL Take 3 mLs (2.5 mg total) by nebulization every 6 (six) hours as needed for wheezing or shortness of breath. What changed: Another medication with the same name was changed. Make sure you understand how and when to take each.   Ventolin HFA 108 (90 Base) MCG/ACT inhaler Generic drug: albuterol INHALE 2 PUFFS INTO THE LUNGS FOUR TIMES DAILY. What changed: See the new instructions.   aspirin EC 81 MG tablet Take 81 mg by mouth every morning.   atorvastatin 80 MG tablet Commonly known as: LIPITOR TAKE 1 TABLET BY MOUTH ONCE DAILY AT BEDTIME.   carvedilol 12.5 MG tablet Commonly known as: COREG Take 1 tablet (12.5 mg total) by mouth 2 (two) times daily.   cefdinir 300 MG capsule Commonly known as: OMNICEF Take 1 capsule (300 mg total) by mouth every 12 (twelve) hours for 4 days.   clorazepate 7.5 MG tablet Commonly known as: TRANXENE Take 1 tablet (7.5 mg total) by mouth daily as needed for anxiety. For nerves   Entresto 24-26 MG Generic drug: sacubitril-valsartan TAKE 1 TABLET BY MOUTH TWICE DAILY.   ezetimibe 10 MG tablet Commonly known as: Zetia Take 1 tablet (10 mg total) by mouth daily.   fluticasone 50 MCG/ACT nasal spray Commonly known as: FLONASE Place 1 spray into both nostrils daily.   leflunomide 20 MG tablet Commonly known as: ARAVA  Take 20 mg by mouth daily.   levocetirizine 5 MG tablet Commonly known as: Xyzal Take 1 tablet (5 mg total) by mouth every evening.   meclizine 25 MG tablet Commonly known as: ANTIVERT Take 25 mg by mouth as needed for dizziness.   meloxicam 7.5 MG tablet Commonly known as: MOBIC Take 7.5 mg by mouth daily.   montelukast 10 MG tablet Commonly known as: SINGULAIR Take 1 tablet  (10 mg total) by mouth at bedtime.   nitroGLYCERIN 0.4 MG SL tablet Commonly known as: NITROSTAT Place 1 tablet (0.4 mg total) under the tongue every 5 (five) minutes x 3 doses as needed for chest pain.   ondansetron 4 MG disintegrating tablet Commonly known as: Zofran ODT Take 1 tablet (4 mg total) by mouth every 8 (eight) hours as needed for nausea.   OXYGEN Inhale 2 L into the lungs at bedtime.   pantoprazole 40 MG tablet Commonly known as: PROTONIX Take 1 tablet (40 mg total) by mouth daily. Start taking on: October 12, 2020   traMADol 50 MG tablet Commonly known as: ULTRAM Take 1 tablet (50 mg total) by mouth daily as needed for severe pain. Maximum dose= 8 tablets per day What changed: reasons to take this   Trelegy Ellipta 200-62.5-25 MCG/INH Aepb Generic drug: Fluticasone-Umeclidin-Vilant INHALE (1) PUFF BY MOUTH INTO THE LUNGS DAILY. What changed: See the new instructions.   Trelegy Ellipta 200-62.5-25 MCG/INH Aepb Generic drug: Fluticasone-Umeclidin-Vilant Inhale 1 puff into the lungs daily. What changed: Another medication with the same name was changed. Make sure you understand how and when to take each.      Allergies  Allergen Reactions  . Plavix [Clopidogrel Bisulfate] Itching    Severe itching  . Alprazolam Nausea And Vomiting  . Codeine Nausea And Vomiting  . Percodan [Oxycodone-Aspirin] Nausea And Vomiting  . Valium Nausea And Vomiting    Contact information for follow-up providers    [name], NP. Schedule an appointment as soon as possible for a visit in 2 week(s).   Specialty: Nurse Practitioner Why: After discharge from the skilled nursing facility. Contact information: [address]  [place] [address] [phone]        Herminio Commons, MD .   Specialty: Cardiology Contact information: [place] [address] [phone]            Contact information for after-discharge care     [facility] .   Service: Skilled Nursing Contact information: [address] [place] [phone]                  The results of significant diagnostics from this hospitalization (including imaging, microbiology, ancillary and laboratory) are listed below for reference.    Significant Diagnostic Studies: DG Chest Portable 1 View  Result Date: 10/08/2020 CLINICAL DATA:  PT c/o weakness and fatigue for several days. Denies cp, fever, sob, or N/V. HX of CHF and hypertension. EXAM: PORTABLE CHEST 1 VIEW COMPARISON:  04/20/2020. FINDINGS: Mild stable enlargement of the cardiopericardial silhouette. No mediastinal or hilar masses. Lungs are hyperexpanded, but clear. No pleural effusion or pneumothorax. Stable changes from cervical spine fusion. Stable left shoulder arthroplasty. Skeletal structures are diffusely demineralized. IMPRESSION: No acute cardiopulmonary disease. Electronically Signed   By: Lajean Manes M.D.   On: 10/08/2020 13:47    Microbiology: Recent Results (from the past 240 hour(s))  Urine culture  Status: Abnormal   Collection Time: 10/08/20  1:13 PM   Specimen: Urine, Clean Catch  Result Value Ref Range Status   Specimen Description   Final    URINE, CLEAN CATCH Performed at [facility], [address] [place], [place] [address]    Special Requests   Final    NONE Performed at [facility], [address], [place], [place] [address]    Culture >=100,000 COLONIES/mL ESCHERICHIA COLI (A)  Final   Report Status 10/11/2020 FINAL  Final   Organism ID, Bacteria ESCHERICHIA COLI (A)  Final      Susceptibility   Escherichia coli - MIC*    AMPICILLIN >=32 RESISTANT Resistant     CEFAZOLIN <=4 SENSITIVE Sensitive     CEFEPIME <=0.12 SENSITIVE Sensitive     CEFTRIAXONE <=0.25 SENSITIVE Sensitive     CIPROFLOXACIN <=0.25 SENSITIVE Sensitive     GENTAMICIN <=1 SENSITIVE Sensitive     IMIPENEM <=0.25  SENSITIVE Sensitive     NITROFURANTOIN <=16 SENSITIVE Sensitive     TRIMETH/SULFA <=20 SENSITIVE Sensitive     AMPICILLIN/SULBACTAM 8 SENSITIVE Sensitive     PIP/TAZO <=4 SENSITIVE Sensitive     * >=100,000 COLONIES/mL ESCHERICHIA COLI  Resp Panel by RT-PCR (Flu A&B, Covid) Urine, Clean Catch     Status: None   Collection Time: 10/08/20  1:17 PM   Specimen: Urine, Clean Catch; Nasopharyngeal(NP) swabs in vial transport medium  Result Value Ref Range Status   SARS Coronavirus 2 by RT PCR NEGATIVE NEGATIVE Final    Comment: (NOTE) SARS-CoV-2 target nucleic acids are NOT DETECTED.  The SARS-CoV-2 RNA is generally detectable in upper respiratory specimens during the acute phase of infection. The lowest concentration of SARS-CoV-2 viral copies this assay can detect is 138 copies/mL. A negative result does not preclude SARS-Cov-2 infection and should not be used as the sole basis for treatment or other patient management decisions. A negative result may occur with  improper specimen collection/handling, submission of specimen other than nasopharyngeal swab, presence of viral mutation(s) within the areas targeted by this assay, and inadequate number of viral copies(<138 copies/mL). A negative result must be combined with clinical observations, patient history, and epidemiological information. The expected result is Negative.  Fact Sheet for Patients:  EntrepreneurPulse.com.au  Fact Sheet for Healthcare Providers:  IncredibleEmployment.be  This test is no t yet approved or cleared by the [place] FDA and  has been authorized for detection and/or diagnosis of SARS-CoV-2 by FDA under an Emergency Use Authorization (EUA). This EUA will remain  in effect (meaning this test can be used) for the duration of the COVID-19 declaration under Section 564(b)(1) of the Act, 21 U.S.C.section 360bbb-3(b)(1), unless the authorization is terminated  or revoked  sooner.       Influenza A by PCR NEGATIVE NEGATIVE Final   Influenza B by PCR NEGATIVE NEGATIVE Final    Comment: (NOTE) The Xpert Xpress SARS-CoV-2/FLU/RSV plus assay is intended as an aid in the diagnosis of influenza from Nasopharyngeal swab specimens and should not be used as a sole basis for treatment. Nasal washings and aspirates are unacceptable for Xpert Xpress SARS-CoV-2/FLU/RSV testing.  Fact Sheet for Patients: EntrepreneurPulse.com.au  Fact Sheet for Healthcare Providers: IncredibleEmployment.be  This test is not yet approved or cleared by the [place] FDA and has been authorized for detection and/or diagnosis of SARS-CoV-2 by FDA under an Emergency Use Authorization (EUA). This EUA will remain in effect (meaning this test can be used) for the duration of  the COVID-19 declaration under Section 564(b)(1) of the Act, 21 U.S.C. section 360bbb-3(b)(1), unless the authorization is terminated or revoked.  Performed at [facility], [address], [place], [place] [address]      Labs: Basic Metabolic Panel: Recent Labs  Lab 10/08/20 1346 10/09/20 0406  NA 134* 136  K 4.1 3.4*  CL 102 104  CO2 28 31  GLUCOSE 105* 102*  BUN 13 11  CREATININE 0.79 0.63  CALCIUM 8.7* 8.7*   Liver Function Tests: Recent Labs  Lab 10/08/20 1346  AST 14*  ALT 12  ALKPHOS 67  BILITOT 1.0  PROT 6.1*  ALBUMIN 3.2*   CBC: Recent Labs  Lab 10/08/20 1346 10/09/20 0406  WBC 7.2 7.3  NEUTROABS 4.0  --   HGB 10.2* 10.1*  HCT 32.6* 32.9*  MCV 81.9 82.0  PLT 225 251   BNP (last 3 results) Recent Labs    11/07/19 1106  BNP 311.0*    Signed:  Barton Dubois MD.  [facility] 10/11/2020, 12:34 PM

## 2020-10-11 NOTE — TOC Transition Note (Signed)
Transition of Care [facility]) - CM/SW Discharge Note   Patient Details  Name: [name] MRN: [id number] Date of Birth: 08/22/1935  Transition of [facility] CM/SW Contact:  Ihor Gully, LCSW Phone Number: 10/11/2020, 2:25 PM   Clinical Narrative:    Message left for daughter regarding discharge. Discharge clinicals sent to facility.  TOC signing off.   Final next level of care: Skilled Nursing Facility Barriers to Discharge: No Barriers Identified   Patient Goals and CMS Choice Patient states their goals for this hospitalization and ongoing recovery are:: Return home with SNF CMS Medicare.gov Compare Post Acute Care list provided to:: Patient Choice offered to / list presented to : Patient  Discharge Placement              Patient chooses bed at: [facility] Patient to be transferred to facility by: message left for daughter connie Name of family member notified: connie, daughter Patient and family notified of of transfer: 10/11/20  Discharge Plan and Services     Post Acute Care Choice: NA                               Social Determinants of Health (SDOH) Interventions     Readmission Risk Interventions No flowsheet data found.

## 2020-10-12 ENCOUNTER — Telehealth: Payer: Self-pay

## 2020-10-12 DIAGNOSIS — I429 Cardiomyopathy, unspecified: Secondary | ICD-10-CM | POA: Diagnosis not present

## 2020-10-12 DIAGNOSIS — Z791 Long term (current) use of non-steroidal anti-inflammatories (NSAID): Secondary | ICD-10-CM | POA: Diagnosis not present

## 2020-10-12 DIAGNOSIS — M1990 Unspecified osteoarthritis, unspecified site: Secondary | ICD-10-CM | POA: Diagnosis not present

## 2020-10-12 DIAGNOSIS — I712 Thoracic aortic aneurysm, without rupture: Secondary | ICD-10-CM | POA: Diagnosis not present

## 2020-10-12 DIAGNOSIS — Z79891 Long term (current) use of opiate analgesic: Secondary | ICD-10-CM | POA: Diagnosis not present

## 2020-10-12 DIAGNOSIS — J9611 Chronic respiratory failure with hypoxia: Secondary | ICD-10-CM | POA: Diagnosis not present

## 2020-10-12 DIAGNOSIS — Z87891 Personal history of nicotine dependence: Secondary | ICD-10-CM | POA: Diagnosis not present

## 2020-10-12 DIAGNOSIS — N390 Urinary tract infection, site not specified: Secondary | ICD-10-CM | POA: Diagnosis not present

## 2020-10-12 DIAGNOSIS — D329 Benign neoplasm of meninges, unspecified: Secondary | ICD-10-CM | POA: Diagnosis not present

## 2020-10-12 DIAGNOSIS — Z7982 Long term (current) use of aspirin: Secondary | ICD-10-CM | POA: Diagnosis not present

## 2020-10-12 DIAGNOSIS — K219 Gastro-esophageal reflux disease without esophagitis: Secondary | ICD-10-CM | POA: Diagnosis not present

## 2020-10-12 DIAGNOSIS — Z7951 Long term (current) use of inhaled steroids: Secondary | ICD-10-CM | POA: Diagnosis not present

## 2020-10-12 DIAGNOSIS — Z9981 Dependence on supplemental oxygen: Secondary | ICD-10-CM | POA: Diagnosis not present

## 2020-10-12 DIAGNOSIS — I5042 Chronic combined systolic (congestive) and diastolic (congestive) heart failure: Secondary | ICD-10-CM | POA: Diagnosis not present

## 2020-10-12 DIAGNOSIS — M5000 Cervical disc disorder with myelopathy, unspecified cervical region: Secondary | ICD-10-CM | POA: Diagnosis not present

## 2020-10-12 DIAGNOSIS — K769 Liver disease, unspecified: Secondary | ICD-10-CM | POA: Diagnosis not present

## 2020-10-12 DIAGNOSIS — D649 Anemia, unspecified: Secondary | ICD-10-CM | POA: Diagnosis not present

## 2020-10-12 DIAGNOSIS — R5381 Other malaise: Secondary | ICD-10-CM | POA: Diagnosis not present

## 2020-10-12 DIAGNOSIS — I110 Hypertensive heart disease with heart failure: Secondary | ICD-10-CM | POA: Diagnosis not present

## 2020-10-12 DIAGNOSIS — Z955 Presence of coronary angioplasty implant and graft: Secondary | ICD-10-CM | POA: Diagnosis not present

## 2020-10-12 DIAGNOSIS — F419 Anxiety disorder, unspecified: Secondary | ICD-10-CM | POA: Diagnosis not present

## 2020-10-12 DIAGNOSIS — B9620 Unspecified Escherichia coli [E. coli] as the cause of diseases classified elsewhere: Secondary | ICD-10-CM | POA: Diagnosis not present

## 2020-10-12 DIAGNOSIS — G894 Chronic pain syndrome: Secondary | ICD-10-CM | POA: Diagnosis not present

## 2020-10-12 DIAGNOSIS — J449 Chronic obstructive pulmonary disease, unspecified: Secondary | ICD-10-CM | POA: Diagnosis not present

## 2020-10-12 DIAGNOSIS — I2510 Atherosclerotic heart disease of native coronary artery without angina pectoris: Secondary | ICD-10-CM | POA: Diagnosis not present

## 2020-10-12 LAB — SARS CORONAVIRUS 2 (TAT 6-24 HRS): SARS Coronavirus 2: NEGATIVE

## 2020-10-12 NOTE — Telephone Encounter (Signed)
[name] is calling to advise   1 x a week for 8 weeks  Aide for 2 weeks  X 4 weeks   3 PRN visits  Pt Refuses to have Physical Therapy

## 2020-10-12 NOTE — Telephone Encounter (Signed)
What is this about and who is Kristi?

## 2020-10-13 NOTE — Telephone Encounter (Signed)
[facility] just advise if you are ok with orders and we will call and let them know

## 2020-10-13 NOTE — Telephone Encounter (Signed)
Sure. That is fine.

## 2020-10-13 NOTE — Telephone Encounter (Signed)
Kristi given verbal orders.

## 2020-10-17 ENCOUNTER — Telehealth: Payer: Self-pay

## 2020-10-17 NOTE — Telephone Encounter (Signed)
[name] from [facility] wanting verbal ok to recheck patients urine next week?

## 2020-10-17 NOTE — Telephone Encounter (Signed)
That is fine 

## 2020-10-17 NOTE — Telephone Encounter (Signed)
[name] aware

## 2020-10-17 NOTE — Telephone Encounter (Signed)
[name] from [facility] called patient still having uti-burning.  She need to recheck next week?  Patient was having chest pains now feels fine did not take her nitroglycerin, under a lot of stress about family issues.  Please contact [name] back [phone] for verbal order uti urine check.

## 2020-10-20 ENCOUNTER — Other Ambulatory Visit: Payer: Self-pay

## 2020-10-20 ENCOUNTER — Ambulatory Visit (INDEPENDENT_AMBULATORY_CARE_PROVIDER_SITE_OTHER): Payer: Medicare Other | Admitting: Nurse Practitioner

## 2020-10-20 ENCOUNTER — Encounter: Payer: Self-pay | Admitting: Nurse Practitioner

## 2020-10-20 VITALS — BP 115/69 | HR 70 | Temp 98.2°F | Resp 16 | Ht 62.0 in | Wt 168.0 lb

## 2020-10-20 DIAGNOSIS — E785 Hyperlipidemia, unspecified: Secondary | ICD-10-CM

## 2020-10-20 DIAGNOSIS — R5381 Other malaise: Secondary | ICD-10-CM

## 2020-10-20 DIAGNOSIS — N3000 Acute cystitis without hematuria: Secondary | ICD-10-CM

## 2020-10-20 DIAGNOSIS — I10 Essential (primary) hypertension: Secondary | ICD-10-CM | POA: Diagnosis not present

## 2020-10-20 NOTE — Progress Notes (Signed)
Established Patient Office Visit  Subjective:  Patient ID: [name], female    DOB: [birth year]  Age: 85 y.o. MRN: [id number]  CC:  Chief Complaint  Patient presents with   Anxiety   COPD    HPI [name] presents for lab follow-up. She was recently discharged form the hospital for UTI, ampicillin resistant E. Coli. She states that she still has some discomfort with urination. Otherwise, she states that [place] has been coming to her house, and she is doing well.  Past Medical History:  Diagnosis Date   Acute on chronic respiratory failure with hypoxia (HCC) 04/24/2018   Anxiety    Atypical chest pain    Bronchospasm    CAD (coronary artery disease)    a. s/p DES to mid-LAD and DES to mid-OM1 in 11/2016   Cervical disc disorder with myelopathy, unspecified cervical region    Cervicalgia 01/19/2009   Qualifier: Diagnosis of  By: Aline Brochure MD, Stanley     Chest pain at rest 11/07/348   Chronic systolic (congestive) heart failure (HCC)    COPD (chronic obstructive pulmonary disease) (HCC)    Coronary artery disease    De Quervain's disease (tenosynovitis) 04/02/2011   Disc disease with myelopathy, cervical    Essential hypertension    Hemorrhoids    Liver mass    Lung, cysts, congenital    Left lung cyst   Myocardial infarction ([place])    Nephrolithiasis    Embedded   Nonischemic cardiomyopathy ([place])    LVEF 35-40% 2015   On home O2    Osteoarthritis    Sprain of wrist 08/07/2012   Thoracic ascending aortic aneurysm (HCC)    4.3 cm April 2016    Past Surgical History:  Procedure Laterality Date   Benign breast tumors     CHOLECYSTECTOMY     COLONOSCOPY     COLONOSCOPY N/A 09/22/2014   Procedure: COLONOSCOPY;  Surgeon: Rogene Houston, MD;  Location: [facility];  Service: Endoscopy;  Laterality: N/A;  830 -- to be done in OR under fluoro   Complete hysterectomy     CORONARY STENT INTERVENTION N/A 11/23/2016   Procedure: Coronary Stent Intervention;   Surgeon: Martinique, Peter M, MD;  Location: [facility];  Service: Cardiovascular;  Laterality: N/A;   LEFT HEART CATH AND CORONARY ANGIOGRAPHY N/A 11/23/2016   Procedure: Left Heart Cath and Coronary Angiography;  Surgeon: Martinique, Peter M, MD;  Location: [facility];  Service: Cardiovascular;  Laterality: N/A;   TONSILLECTOMY AND ADENOIDECTOMY      Family History  Problem Relation Age of Onset   Heart disease Mother    Aneurysm Father    Lung cancer Brother    Heart disease Sister    Diabetes Brother    Heart disease Brother     Social History   Socioeconomic History   Marital status: Widowed    Spouse name: Not on file   Number of children: Not on file   Years of education: 9th   Highest education level: Not on file  Occupational History    Employer: RETIRED  Tobacco Use   Smoking status: Former    Packs/day: 0.25    Years: 3.00    Pack years: 0.75    Types: Cigarettes    Start date: 05/14/1977    Quit date: 05/28/2008    Years since quitting: 12.4   Smokeless tobacco: Never   Tobacco comments:    patient states she  only smoked for 3 years total  Vaping Use   Vaping Use: Never used  Substance and Sexual Activity   Alcohol use: No    Alcohol/week: 0.0 standard drinks   Drug use: No   Sexual activity: Never  Other Topics Concern   Not on file  Social History Narrative   Not on file   Social Determinants of Health   Financial Resource Strain: Not on file  Food Insecurity: Not on file  Transportation Needs: Not on file  Physical Activity: Not on file  Stress: Not on file  Social Connections: Not on file  Intimate Partner Violence: Not on file    Outpatient Medications Prior to Visit  Medication Sig Dispense Refill   albuterol (PROVENTIL) (2.5 MG/3ML) 0.083% nebulizer solution Take 3 mLs (2.5 mg total) by nebulization every 6 (six) hours as needed for wheezing or shortness of breath. 75 mL 12   aspirin EC 81 MG tablet Take 81 mg by mouth every  morning.      atorvastatin (LIPITOR) 80 MG tablet TAKE 1 TABLET BY MOUTH ONCE DAILY AT BEDTIME. 90 tablet 3   clorazepate (TRANXENE) 7.5 MG tablet Take 1 tablet (7.5 mg total) by mouth daily as needed for anxiety. For nerves 15 tablet 0   ENTRESTO 24-26 MG TAKE 1 TABLET BY MOUTH TWICE DAILY. 60 tablet 6   ezetimibe (ZETIA) 10 MG tablet Take 1 tablet (10 mg total) by mouth daily. 90 tablet 3   fluticasone (FLONASE) 50 MCG/ACT nasal spray Place 1 spray into both nostrils daily. 16 g 2   Fluticasone-Umeclidin-Vilant (TRELEGY ELLIPTA) 200-62.5-25 MCG/INH AEPB Inhale 1 puff into the lungs daily. 60 each 0   leflunomide (ARAVA) 20 MG tablet Take 20 mg by mouth daily.     levocetirizine (XYZAL) 5 MG tablet Take 1 tablet (5 mg total) by mouth every evening. 30 tablet 1   meclizine (ANTIVERT) 25 MG tablet Take 25 mg by mouth as needed for dizziness.     meloxicam (MOBIC) 7.5 MG tablet Take 7.5 mg by mouth daily.      montelukast (SINGULAIR) 10 MG tablet Take 1 tablet (10 mg total) by mouth at bedtime. 30 tablet 5   nitroGLYCERIN (NITROSTAT) 0.4 MG SL tablet Place 1 tablet (0.4 mg total) under the tongue every 5 (five) minutes x 3 doses as needed for chest pain. 25 tablet 3   ondansetron (ZOFRAN ODT) 4 MG disintegrating tablet Take 1 tablet (4 mg total) by mouth every 8 (eight) hours as needed for nausea. 10 tablet 0   OXYGEN Inhale 2 L into the lungs at bedtime.     pantoprazole (PROTONIX) 40 MG tablet Take 1 tablet (40 mg total) by mouth daily. 30 tablet 1   traMADol (ULTRAM) 50 MG tablet Take 1 tablet (50 mg total) by mouth daily as needed for severe pain. Maximum dose= 8 tablets per day 15 tablet 0   TRELEGY ELLIPTA 200-62.5-25 MCG/INH AEPB INHALE (1) PUFF BY MOUTH INTO THE LUNGS DAILY. (Patient taking differently: Take 1 puff by mouth.) 60 each 5   VENTOLIN HFA 108 (90 Base) MCG/ACT inhaler INHALE 2 PUFFS INTO THE LUNGS FOUR TIMES DAILY. (Patient taking differently: Inhale 2 puffs into the lungs every 4  (four) hours as needed for shortness of breath or wheezing.) 18 g PRN   carvedilol (COREG) 12.5 MG tablet Take 1 tablet (12.5 mg total) by mouth 2 (two) times daily. 180 tablet 3   No facility-administered medications prior to visit.  Allergies  Allergen Reactions   Plavix [Clopidogrel Bisulfate] Itching    Severe itching   Alprazolam Nausea And Vomiting   Codeine Nausea And Vomiting   Percodan [Oxycodone-Aspirin] Nausea And Vomiting   Valium Nausea And Vomiting    ROS Review of Systems  Constitutional: Negative.   Respiratory: Negative.    Cardiovascular: Negative.   Genitourinary:  Positive for dysuria.  Psychiatric/Behavioral: Negative.       Objective:    Physical Exam Constitutional:      Appearance: Normal appearance.  Cardiovascular:     Rate and Rhythm: Normal rate and regular rhythm.     Pulses: Normal pulses.     Heart sounds: Normal heart sounds.  Pulmonary:     Effort: Pulmonary effort is normal.     Breath sounds: Normal breath sounds.  Neurological:     Mental Status: She is alert.  Psychiatric:        Mood and Affect: Mood normal.        Behavior: Behavior normal.        Thought Content: Thought content normal.        Judgment: Judgment normal.    BP 115/69 (BP Location: Right Arm, Patient Position: Sitting, Cuff Size: Normal)   Pulse 70   Temp 98.2 F (36.8 C) (Oral)   Resp 16   Ht 5\' 2"  (1.575 m)   Wt 168 lb (76.2 kg)   SpO2 93%   BMI 30.73 kg/m  Wt Readings from Last 3 Encounters:  10/20/20 168 lb (76.2 kg)  10/08/20 167 lb 8.8 oz (76 kg)  07/20/20 166 lb (75.3 kg)     Health Maintenance Due  Topic Date Due   COVID-19 Vaccine (1) Never done   Pneumococcal Vaccine 98-34 Years old (1 - PCV) Never done   TETANUS/TDAP  Never done   Zoster Vaccines- Shingrix (1 of 2) Never done   PNA vac Low Risk Adult (2 of 2 - PCV13) 07/11/2016    There are no preventive care reminders to display for this patient.  No results found for:  TSH Lab Results  Component Value Date   WBC 7.3 10/09/2020   HGB 10.1 (L) 10/09/2020   HCT 32.9 (L) 10/09/2020   MCV 82.0 10/09/2020   PLT 251 10/09/2020   Lab Results  Component Value Date   NA 136 10/09/2020   K 3.4 (L) 10/09/2020   CO2 31 10/09/2020   GLUCOSE 102 (H) 10/09/2020   BUN 11 10/09/2020   CREATININE 0.63 10/09/2020   BILITOT 1.0 10/08/2020   ALKPHOS 67 10/08/2020   AST 14 (L) 10/08/2020   ALT 12 10/08/2020   PROT 6.1 (L) 10/08/2020   ALBUMIN 3.2 (L) 10/08/2020   CALCIUM 8.7 (L) 10/09/2020   ANIONGAP 1 (L) 10/09/2020   Lab Results  Component Value Date   CHOL 235 (H) 05/25/2020   Lab Results  Component Value Date   HDL 89 05/25/2020   Lab Results  Component Value Date   LDLCALC 136 (H) 05/25/2020   Lab Results  Component Value Date   TRIG 58 05/25/2020   Lab Results  Component Value Date   CHOLHDL 2.2 12/19/2018   No results found for: HGBA1C    Assessment & Plan:   Problem List Items Addressed This Visit       Genitourinary   UTI (urinary tract infection)    -was recently discharged from the hospital for UTI- E.coli resistant to ampicillin -recheck CBC and metabolic panel  Other   Physical deconditioning    -she refused SNF at hospital because she didn't want to quarantine at SNF        No orders of the defined types were placed in this encounter.   Follow-up: No follow-ups on file.    [name], NP

## 2020-10-20 NOTE — Assessment & Plan Note (Signed)
BP Readings from Last 3 Encounters:  10/20/20 115/69  10/11/20 138/72  07/20/20 129/67   -well controlled today

## 2020-10-20 NOTE — Patient Instructions (Signed)
Please have labs drawn today

## 2020-10-20 NOTE — Assessment & Plan Note (Addendum)
-  reviewed hospital records -was recently discharged from the hospital for UTI- E.coli resistant to ampicillin -recheck CBC and metabolic panel

## 2020-10-20 NOTE — Assessment & Plan Note (Signed)
-  she refused SNF at hospital because she didn't want to quarantine at [facility]

## 2020-10-21 LAB — CBC WITH DIFFERENTIAL/PLATELET
Basophils Absolute: 0 10*3/uL (ref 0.0–0.2)
Basos: 1 %
EOS (ABSOLUTE): 0.3 10*3/uL (ref 0.0–0.4)
Eos: 5 %
Hematocrit: 36.5 % (ref 34.0–46.6)
Hemoglobin: 11.4 g/dL (ref 11.1–15.9)
Immature Grans (Abs): 0 10*3/uL (ref 0.0–0.1)
Immature Granulocytes: 0 %
Lymphocytes Absolute: 2.1 10*3/uL (ref 0.7–3.1)
Lymphs: 33 %
MCH: 24.5 pg — ABNORMAL LOW (ref 26.6–33.0)
MCHC: 31.2 g/dL — ABNORMAL LOW (ref 31.5–35.7)
MCV: 79 fL (ref 79–97)
Monocytes Absolute: 0.7 10*3/uL (ref 0.1–0.9)
Monocytes: 12 %
Neutrophils Absolute: 3.2 10*3/uL (ref 1.4–7.0)
Neutrophils: 49 %
Platelets: 412 10*3/uL (ref 150–450)
RBC: 4.65 x10E6/uL (ref 3.77–5.28)
RDW: 15.2 % (ref 11.7–15.4)
WBC: 6.4 10*3/uL (ref 3.4–10.8)

## 2020-10-21 LAB — CMP14+EGFR
ALT: 10 IU/L (ref 0–32)
AST: 16 IU/L (ref 0–40)
Albumin/Globulin Ratio: 1.9 (ref 1.2–2.2)
Albumin: 4.4 g/dL (ref 3.6–4.6)
Alkaline Phosphatase: 87 IU/L (ref 44–121)
BUN/Creatinine Ratio: 18 (ref 12–28)
BUN: 12 mg/dL (ref 8–27)
Bilirubin Total: 0.5 mg/dL (ref 0.0–1.2)
CO2: 25 mmol/L (ref 20–29)
Calcium: 10.1 mg/dL (ref 8.7–10.3)
Chloride: 105 mmol/L (ref 96–106)
Creatinine, Ser: 0.66 mg/dL (ref 0.57–1.00)
Globulin, Total: 2.3 g/dL (ref 1.5–4.5)
Glucose: 96 mg/dL (ref 65–99)
Potassium: 4.6 mmol/L (ref 3.5–5.2)
Sodium: 143 mmol/L (ref 134–144)
Total Protein: 6.7 g/dL (ref 6.0–8.5)
eGFR: 87 mL/min/{1.73_m2} (ref >59)

## 2020-10-21 LAB — LIPID PANEL WITH LDL/HDL RATIO
Cholesterol, Total: 213 mg/dL — ABNORMAL HIGH (ref 100–199)
HDL: 68 mg/dL (ref >39)
LDL Chol Calc (NIH): 131 mg/dL — ABNORMAL HIGH (ref 0–99)
LDL/HDL Ratio: 1.9 ratio (ref 0.0–3.2)
Triglycerides: 80 mg/dL (ref 0–149)
VLDL Cholesterol Cal: 14 mg/dL (ref 5–40)

## 2020-10-23 LAB — URINE CULTURE

## 2020-10-25 ENCOUNTER — Other Ambulatory Visit: Payer: Self-pay | Admitting: Cardiology

## 2020-10-25 ENCOUNTER — Other Ambulatory Visit: Payer: Self-pay

## 2020-10-25 ENCOUNTER — Encounter: Payer: Self-pay | Admitting: Pulmonary Disease

## 2020-10-25 ENCOUNTER — Ambulatory Visit (INDEPENDENT_AMBULATORY_CARE_PROVIDER_SITE_OTHER): Payer: Medicare Other | Admitting: Pulmonary Disease

## 2020-10-25 VITALS — BP 142/80 | HR 74 | Temp 97.6°F | Ht 62.0 in | Wt 165.0 lb

## 2020-10-25 DIAGNOSIS — J9611 Chronic respiratory failure with hypoxia: Secondary | ICD-10-CM

## 2020-10-25 DIAGNOSIS — I639 Cerebral infarction, unspecified: Secondary | ICD-10-CM | POA: Diagnosis not present

## 2020-10-25 DIAGNOSIS — J432 Centrilobular emphysema: Secondary | ICD-10-CM | POA: Diagnosis not present

## 2020-10-25 MED ORDER — TRELEGY ELLIPTA 100-62.5-25 MCG/INH IN AEPB: 1.0000 | INHALATION_SPRAY | Freq: Every day | RESPIRATORY_TRACT | 11 refills | Status: DC

## 2020-10-25 MED ORDER — ALBUTEROL SULFATE HFA 108 (90 BASE) MCG/ACT IN AERS: 2.0000 | INHALATION_SPRAY | RESPIRATORY_TRACT | 5 refills | Status: DC | PRN

## 2020-10-25 NOTE — Progress Notes (Signed)
[facility]  Chief Complaint  Patient presents with   Follow-up    No respiratory complaints currently    Constitutional:  BP (!) 142/80 (BP Location: Left Arm, Cuff Size: Normal)   Pulse 74   Temp 97.6 F (36.4 C) (Temporal)   Ht 5\' 2"  (1.575 m)   Wt 165 lb (74.8 kg)   SpO2 97% Comment: Room air  BMI 30.18 kg/m   Past Medical History:  Congenital lung cyst, HTN, CAD s/p DES, ischemic cardiomyopathy  Past Surgical History:  Her  has a past surgical history that includes Tonsillectomy and adenoidectomy; Complete hysterectomy; Benign breast tumors; Cholecystectomy; Colonoscopy; Colonoscopy (N/A, 09/22/2014); LEFT HEART CATH AND CORONARY ANGIOGRAPHY (N/A, 11/23/2016); and CORONARY STENT INTERVENTION (N/A, 11/23/2016).  Brief Summary:  [name] is a 85 y.o. female former smoker with COPD and chronic respiratory failure with hypoxia..       Subjective:   She says she coughed up a plug a while ago and felt much better after that.  Hasn't needed to use her nebulizer since.  Trelegy works well.  Got terrible nightmares from singulair.  Mild sinus congestion.  Using flonase.  Keeps up with activity.  Chest xray from 10/08/20 was unremarkable.  Physical Exam:   Appearance - well kempt   ENMT - no sinus tenderness, no oral exudate, no LAN, Mallampati 2 airway, no stridor, poor hearing  Respiratory - decreased breath sounds bilaterally, no wheezing or rales  CV - s1s2 regular rate and rhythm, no murmurs  Ext - no clubbing, no edema  Skin - no rashes  Psych - normal mood and affect   Pulmonary testing:  PFT 08/31/11 >> FEV1 1.44 (70%), FEV1% 75, TLC 5.32 (108%), DLCO 61%  Cardiac Tests:  Echo 12/17/18 >> EF 45%, mod LVH  Social History:  She  reports that she quit smoking about 12 years ago. Her smoking use included cigarettes. She started smoking about 43 years ago. She has a 0.75 pack-year smoking history. She has never  used smokeless tobacco. She reports that she does not drink alcohol and does not use drugs.  Family History:  Her family history includes Aneurysm in her father; Diabetes in her brother; Heart disease in her brother, mother, and sister; Lung cancer in her brother.     Assessment/Plan:   COPD with asthma and emphysema. - change trelegy to 100 one puff daily - prn albuterol  Upper airway cough from post nasal drip in setting of seasonal allergic rhinitis. - stable - prn flonase - singulair caused nightmares  Chronic respiratory failure with hypoxia from COPD. - uses 2 liters oxygen with exertion prn and sleep  CAD, ischemic CM, HTN, Thoracic aortic aneurysm. - followed by [facility]  Time Spent Involved in Patient Care on Day of Examination:  22 minutes  Follow up:   Patient Instructions  Follow up in 1 year  Medication List:   Allergies as of 10/25/2020       Reactions   Plavix [clopidogrel Bisulfate] Itching   Severe itching   Alprazolam Nausea And Vomiting   Codeine Nausea And Vomiting   Percodan [oxycodone-aspirin] Nausea And Vomiting   Valium Nausea And Vomiting        Medication List        Accurate as of October 25, 2020 12:55 PM. If you have any questions, ask your nurse or doctor.          STOP taking these medications  atorvastatin 80 MG tablet Commonly known as: LIPITOR Stopped by: Chesley Mires, MD   ezetimibe 10 MG tablet Commonly known as: Zetia Stopped by: Chesley Mires, MD   leflunomide 20 MG tablet Commonly known as: ARAVA Stopped by: Chesley Mires, MD   levocetirizine 5 MG tablet Commonly known as: Xyzal Stopped by: Chesley Mires, MD   meloxicam 7.5 MG tablet Commonly known as: MOBIC Stopped by: Chesley Mires, MD   montelukast 10 MG tablet Commonly known as: SINGULAIR Stopped by: Chesley Mires, MD   Trelegy Ellipta 200-62.5-25 MCG/INH Aepb Generic drug: Fluticasone-Umeclidin-Vilant Replaced by: Trelegy Ellipta 100-62.5-25  MCG/INH Aepb Stopped by: Chesley Mires, MD       TAKE these medications    albuterol (2.5 MG/3ML) 0.083% nebulizer solution Commonly known as: PROVENTIL Take 3 mLs (2.5 mg total) by nebulization every 6 (six) hours as needed for wheezing or shortness of breath. What changed: Another medication with the same name was changed. Make sure you understand how and when to take each. Changed by: Chesley Mires, MD   albuterol 108 (90 Base) MCG/ACT inhaler Commonly known as: Ventolin HFA Inhale 2 puffs into the lungs every 4 (four) hours as needed for wheezing or shortness of breath. What changed: See the new instructions. Changed by: Chesley Mires, MD   aspirin EC 81 MG tablet Take 81 mg by mouth every morning.   carvedilol 12.5 MG tablet Commonly known as: COREG TAKE (1) TABLET BY MOUTH TWICE DAILY. What changed: See the new instructions. Changed by: Carlyle Dolly, MD   clorazepate 7.5 MG tablet Commonly known as: TRANXENE Take 1 tablet (7.5 mg total) by mouth daily as needed for anxiety. For nerves   Entresto 24-26 MG Generic drug: sacubitril-valsartan TAKE 1 TABLET BY MOUTH TWICE DAILY.   fluticasone 50 MCG/ACT nasal spray Commonly known as: FLONASE Place 1 spray into both nostrils daily.   meclizine 25 MG tablet Commonly known as: ANTIVERT Take 25 mg by mouth as needed for dizziness.   nitroGLYCERIN 0.4 MG SL tablet Commonly known as: NITROSTAT Place 1 tablet (0.4 mg total) under the tongue every 5 (five) minutes x 3 doses as needed for chest pain.   ondansetron 4 MG disintegrating tablet Commonly known as: Zofran ODT Take 1 tablet (4 mg total) by mouth every 8 (eight) hours as needed for nausea.   OXYGEN Inhale 2 L into the lungs at bedtime.   pantoprazole 40 MG tablet Commonly known as: PROTONIX Take 1 tablet (40 mg total) by mouth daily.   traMADol 50 MG tablet Commonly known as: ULTRAM Take 1 tablet (50 mg total) by mouth daily as needed for severe pain.  Maximum dose= 8 tablets per day   Trelegy Ellipta 100-62.5-25 MCG/INH Aepb Generic drug: Fluticasone-Umeclidin-Vilant Inhale 1 puff into the lungs daily. Replaces: Trelegy Ellipta 200-62.5-25 MCG/INH Aepb Started by: Chesley Mires, MD        Signature:  Chesley Mires, MD [place] Pager - [phone] 10/25/2020, 12:55 PM

## 2020-10-25 NOTE — Patient Instructions (Signed)
Follow up in 1 year.

## 2020-10-26 ENCOUNTER — Telehealth: Payer: Self-pay

## 2020-10-26 NOTE — Telephone Encounter (Signed)
Patient called asking if her blood work is okay. Call back # [phone].

## 2020-10-27 DIAGNOSIS — N390 Urinary tract infection, site not specified: Secondary | ICD-10-CM | POA: Diagnosis not present

## 2020-10-27 NOTE — Telephone Encounter (Signed)
Pt informed of labs and urine result.

## 2020-10-28 ENCOUNTER — Other Ambulatory Visit: Payer: Self-pay | Admitting: Nurse Practitioner

## 2020-10-28 DIAGNOSIS — N3000 Acute cystitis without hematuria: Secondary | ICD-10-CM

## 2020-10-28 MED ORDER — CEPHALEXIN 500 MG PO CAPS: 500.0000 mg | ORAL_CAPSULE | Freq: Two times a day (BID) | ORAL | 0 refills | Status: AC

## 2020-10-31 ENCOUNTER — Telehealth: Payer: Self-pay

## 2020-10-31 NOTE — Telephone Encounter (Signed)
Anderson Malta informed we did receive urine culture.

## 2020-10-31 NOTE — Telephone Encounter (Signed)
Pt has started Antibiotics as of Sat 6-18, caller is making sure we received all the results

## 2020-11-11 DIAGNOSIS — M5000 Cervical disc disorder with myelopathy, unspecified cervical region: Secondary | ICD-10-CM | POA: Diagnosis not present

## 2020-11-11 DIAGNOSIS — I110 Hypertensive heart disease with heart failure: Secondary | ICD-10-CM | POA: Diagnosis not present

## 2020-11-11 DIAGNOSIS — I2510 Atherosclerotic heart disease of native coronary artery without angina pectoris: Secondary | ICD-10-CM | POA: Diagnosis not present

## 2020-11-11 DIAGNOSIS — M1990 Unspecified osteoarthritis, unspecified site: Secondary | ICD-10-CM | POA: Diagnosis not present

## 2020-11-11 DIAGNOSIS — R5381 Other malaise: Secondary | ICD-10-CM | POA: Diagnosis not present

## 2020-11-11 DIAGNOSIS — J449 Chronic obstructive pulmonary disease, unspecified: Secondary | ICD-10-CM | POA: Diagnosis not present

## 2020-11-11 DIAGNOSIS — Z7982 Long term (current) use of aspirin: Secondary | ICD-10-CM | POA: Diagnosis not present

## 2020-11-11 DIAGNOSIS — N390 Urinary tract infection, site not specified: Secondary | ICD-10-CM | POA: Diagnosis not present

## 2020-11-11 DIAGNOSIS — Z791 Long term (current) use of non-steroidal anti-inflammatories (NSAID): Secondary | ICD-10-CM | POA: Diagnosis not present

## 2020-11-11 DIAGNOSIS — K219 Gastro-esophageal reflux disease without esophagitis: Secondary | ICD-10-CM | POA: Diagnosis not present

## 2020-11-11 DIAGNOSIS — Z9981 Dependence on supplemental oxygen: Secondary | ICD-10-CM | POA: Diagnosis not present

## 2020-11-11 DIAGNOSIS — Z955 Presence of coronary angioplasty implant and graft: Secondary | ICD-10-CM | POA: Diagnosis not present

## 2020-11-11 DIAGNOSIS — Z87891 Personal history of nicotine dependence: Secondary | ICD-10-CM | POA: Diagnosis not present

## 2020-11-11 DIAGNOSIS — I429 Cardiomyopathy, unspecified: Secondary | ICD-10-CM | POA: Diagnosis not present

## 2020-11-11 DIAGNOSIS — Z7951 Long term (current) use of inhaled steroids: Secondary | ICD-10-CM | POA: Diagnosis not present

## 2020-11-11 DIAGNOSIS — I712 Thoracic aortic aneurysm, without rupture: Secondary | ICD-10-CM | POA: Diagnosis not present

## 2020-11-11 DIAGNOSIS — D329 Benign neoplasm of meninges, unspecified: Secondary | ICD-10-CM | POA: Diagnosis not present

## 2020-11-11 DIAGNOSIS — B9620 Unspecified Escherichia coli [E. coli] as the cause of diseases classified elsewhere: Secondary | ICD-10-CM | POA: Diagnosis not present

## 2020-11-11 DIAGNOSIS — G894 Chronic pain syndrome: Secondary | ICD-10-CM | POA: Diagnosis not present

## 2020-11-11 DIAGNOSIS — D649 Anemia, unspecified: Secondary | ICD-10-CM | POA: Diagnosis not present

## 2020-11-11 DIAGNOSIS — Z79891 Long term (current) use of opiate analgesic: Secondary | ICD-10-CM | POA: Diagnosis not present

## 2020-11-11 DIAGNOSIS — J9611 Chronic respiratory failure with hypoxia: Secondary | ICD-10-CM | POA: Diagnosis not present

## 2020-11-11 DIAGNOSIS — F419 Anxiety disorder, unspecified: Secondary | ICD-10-CM | POA: Diagnosis not present

## 2020-11-11 DIAGNOSIS — K769 Liver disease, unspecified: Secondary | ICD-10-CM | POA: Diagnosis not present

## 2020-11-11 DIAGNOSIS — I5042 Chronic combined systolic (congestive) and diastolic (congestive) heart failure: Secondary | ICD-10-CM | POA: Diagnosis not present

## 2020-11-13 DIAGNOSIS — Z20822 Contact with and (suspected) exposure to covid-19: Secondary | ICD-10-CM | POA: Diagnosis not present

## 2020-11-22 ENCOUNTER — Other Ambulatory Visit (HOSPITAL_COMMUNITY)
Admission: RE | Admit: 2020-11-22 | Discharge: 2020-11-22 | Disposition: A | Discharge status: Home / Self Care | Payer: Medicare Other | Source: Other Acute Inpatient Hospital | Attending: Nurse Practitioner | Admitting: Nurse Practitioner

## 2020-11-22 DIAGNOSIS — N390 Urinary tract infection, site not specified: Secondary | ICD-10-CM | POA: Insufficient documentation

## 2020-11-22 DIAGNOSIS — R32 Unspecified urinary incontinence: Secondary | ICD-10-CM | POA: Diagnosis not present

## 2020-11-22 LAB — URINALYSIS, COMPLETE (UACMP) WITH MICROSCOPIC
Bilirubin Urine: NEGATIVE
Glucose, UA: NEGATIVE mg/dL
Ketones, ur: NEGATIVE mg/dL
Nitrite: NEGATIVE
Protein, ur: NEGATIVE mg/dL
Specific Gravity, Urine: 1.006 (ref 1.005–1.030)
WBC, UA: >50 WBC/hpf — ABNORMAL HIGH (ref 0–5)
pH: 6 (ref 5.0–8.0)

## 2020-11-22 NOTE — Progress Notes (Signed)
What's up with this patient? I don't see a note or order for this, but it was completed today... not here... and it says I ordered it. Do we have a phone visit set up soon?

## 2020-11-23 ENCOUNTER — Telehealth: Payer: Self-pay

## 2020-11-23 NOTE — Telephone Encounter (Signed)
I spoke to pt.

## 2020-11-23 NOTE — Telephone Encounter (Signed)
Patient returning call.  [place] called left message about labs. Call back # [phone]

## 2020-11-24 ENCOUNTER — Ambulatory Visit
Admission: EM | Admit: 2020-11-24 | Discharge: 2020-11-24 | Disposition: A | Discharge status: Home / Self Care | Payer: Medicare Other | Attending: Emergency Medicine | Admitting: Emergency Medicine

## 2020-11-24 ENCOUNTER — Other Ambulatory Visit: Payer: Self-pay | Admitting: Nurse Practitioner

## 2020-11-24 ENCOUNTER — Encounter: Payer: Self-pay | Admitting: Emergency Medicine

## 2020-11-24 ENCOUNTER — Other Ambulatory Visit: Payer: Self-pay

## 2020-11-24 DIAGNOSIS — N3001 Acute cystitis with hematuria: Secondary | ICD-10-CM

## 2020-11-24 LAB — POCT URINALYSIS DIP (MANUAL ENTRY)
Bilirubin, UA: NEGATIVE
Glucose, UA: NEGATIVE mg/dL
Ketones, POC UA: NEGATIVE mg/dL
Nitrite, UA: POSITIVE — AB
Protein Ur, POC: NEGATIVE mg/dL
Spec Grav, UA: 1.01 (ref 1.010–1.025)
Urobilinogen, UA: 0.2 E.U./dL
pH, UA: 6.5 (ref 5.0–8.0)

## 2020-11-24 MED ORDER — SULFAMETHOXAZOLE-TRIMETHOPRIM 800-160 MG PO TABS: 1.0000 | ORAL_TABLET | Freq: Two times a day (BID) | ORAL | 0 refills | Status: AC

## 2020-11-24 NOTE — Progress Notes (Signed)
I tried sending in Bactrim, but it looks like this was already sent in.

## 2020-11-24 NOTE — Progress Notes (Signed)
Pt informed that abt has been sent.

## 2020-11-24 NOTE — Discharge Instructions (Addendum)
Urine concerning for UTI Urine culture sent.  We will call you with the results.   Push fluids and get plenty of rest.   Take antibiotic as directed and to completion Follow up with PCP if symptoms persists Return here or go to ER if you have any new or worsening symptoms such as fever, worsening abdominal pain, nausea/vomiting, flank pain, etc... 

## 2020-11-24 NOTE — ED Triage Notes (Signed)
Pt here with ongoing UTI since May. Tried to call PCP for a new Rx, but told her to come here. Lower abdominal pain. Burning on urination.

## 2020-11-24 NOTE — ED Provider Notes (Signed)
[facility]   CC: Burning with urination  SUBJECTIVE:  [name] is a 85 y.o. female who complains of dysuria and lower abdominal pressure x few months.  Reports ongoing UTI.  Has been on cefdinir in the past.  Has tried OTC medications without relief.  Symptoms are made worse with urination.  Admits to similar symptoms in the past.  Denies fever, chills, nausea, vomiting, abdominal pain, flank pain, abnormal vaginal discharge or bleeding, hematuria.    LMP: No LMP recorded. Patient has had a hysterectomy.  ROS: As in HPI.  All other pertinent ROS negative.     Past Medical History:  Diagnosis Date   Acute on chronic respiratory failure with hypoxia (HCC) 04/24/2018   Anxiety    Atypical chest pain    Bronchospasm    CAD (coronary artery disease)    a. s/p DES to mid-LAD and DES to mid-OM1 in 11/2016   Cervical disc disorder with myelopathy, unspecified cervical region    Cervicalgia 01/19/2009   Qualifier: Diagnosis of  By: Aline Brochure MD, Stanley     Chest pain at rest 2/67/1245   Chronic systolic (congestive) heart failure (HCC)    COPD (chronic obstructive pulmonary disease) (HCC)    Coronary artery disease    De Quervain's disease (tenosynovitis) 04/02/2011   Disc disease with myelopathy, cervical    Essential hypertension    Hemorrhoids    Liver mass    Lung, cysts, congenital    Left lung cyst   Myocardial infarction ([place])    Nephrolithiasis    Embedded   Nonischemic cardiomyopathy ([place])    LVEF 35-40% 2015   On home O2    Osteoarthritis    Sprain of wrist 08/07/2012   Thoracic ascending aortic aneurysm (HCC)    4.3 cm April 2016   Past Surgical History:  Procedure Laterality Date   Benign breast tumors     CHOLECYSTECTOMY     COLONOSCOPY     COLONOSCOPY N/A 09/22/2014   Procedure: COLONOSCOPY;  Surgeon: Rogene Houston, MD;  [facility];  Service: Endoscopy;  Laterality: N/A;  830 -- to be done in OR under fluoro   Complete  hysterectomy     CORONARY STENT INTERVENTION N/A 11/23/2016   Procedure: Coronary Stent Intervention;  Surgeon: Martinique, Peter M, MD;  Location: [place] [facility];  Service: Cardiovascular;  Laterality: N/A;   LEFT HEART CATH AND CORONARY ANGIOGRAPHY N/A 11/23/2016   Procedure: Left Heart Cath and Coronary Angiography;  Surgeon: Martinique, Peter M, MD;  Location: [place] [facility];  Service: Cardiovascular;  Laterality: N/A;   TONSILLECTOMY AND ADENOIDECTOMY     Allergies  Allergen Reactions   Plavix [Clopidogrel Bisulfate] Itching    Severe itching   Alprazolam Nausea And Vomiting   Codeine Nausea And Vomiting   Percodan [Oxycodone-Aspirin] Nausea And Vomiting   Valium Nausea And Vomiting   No current facility-administered medications on file prior to encounter.   Current Outpatient Medications on File Prior to Encounter  Medication Sig Dispense Refill   albuterol (PROVENTIL) (2.5 MG/3ML) 0.083% nebulizer solution Take 3 mLs (2.5 mg total) by nebulization every 6 (six) hours as needed for wheezing or shortness of breath. 75 mL 12   albuterol (VENTOLIN HFA) 108 (90 Base) MCG/ACT inhaler Inhale 2 puffs into the lungs every 4 (four) hours as needed for wheezing or shortness of breath. 18 g 5   aspirin EC 81 MG tablet Take 81 mg by mouth every morning.  carvedilol (COREG) 12.5 MG tablet TAKE (1) TABLET BY MOUTH TWICE DAILY. 180 tablet 3   clorazepate (TRANXENE) 7.5 MG tablet Take 1 tablet (7.5 mg total) by mouth daily as needed for anxiety. For nerves 15 tablet 0   ENTRESTO 24-26 MG TAKE 1 TABLET BY MOUTH TWICE DAILY. 60 tablet 6   fluticasone (FLONASE) 50 MCG/ACT nasal spray Place 1 spray into both nostrils daily. 16 g 2   Fluticasone-Umeclidin-Vilant (TRELEGY ELLIPTA) 100-62.5-25 MCG/INH AEPB Inhale 1 puff into the lungs daily. 60 each 11   meclizine (ANTIVERT) 25 MG tablet Take 25 mg by mouth as needed for dizziness.     nitroGLYCERIN (NITROSTAT) 0.4 MG SL tablet Place 1 tablet (0.4 mg  total) under the tongue every 5 (five) minutes x 3 doses as needed for chest pain. 25 tablet 3   ondansetron (ZOFRAN ODT) 4 MG disintegrating tablet Take 1 tablet (4 mg total) by mouth every 8 (eight) hours as needed for nausea. 10 tablet 0   OXYGEN Inhale 2 L into the lungs at bedtime.     pantoprazole (PROTONIX) 40 MG tablet Take 1 tablet (40 mg total) by mouth daily. 30 tablet 1   traMADol (ULTRAM) 50 MG tablet Take 1 tablet (50 mg total) by mouth daily as needed for severe pain. Maximum dose= 8 tablets per day 15 tablet 0   Social History   Socioeconomic History   Marital status: Widowed    Spouse name: Not on file   Number of children: Not on file   Years of education: 9th   Highest education level: Not on file  Occupational History    Employer: RETIRED  Tobacco Use   Smoking status: Former    Packs/day: 0.25    Years: 3.00    Pack years: 0.75    Types: Cigarettes    Start date: 05/14/1977    Quit date: 05/28/2008    Years since quitting: 12.5   Smokeless tobacco: Never   Tobacco comments:    patient states she only smoked for 3 years total  Vaping Use   Vaping Use: Never used  Substance and Sexual Activity   Alcohol use: No    Alcohol/week: 0.0 standard drinks   Drug use: No   Sexual activity: Never  Other Topics Concern   Not on file  Social History Narrative   Not on file   Social Determinants of Health   Financial Resource Strain: Not on file  Food Insecurity: Not on file  Transportation Needs: Not on file  Physical Activity: Not on file  Stress: Not on file  Social Connections: Not on file  Intimate Partner Violence: Not on file   Family History  Problem Relation Age of Onset   Heart disease Mother    Aneurysm Father    Lung cancer Brother    Heart disease Sister    Diabetes Brother    Heart disease Brother     OBJECTIVE:  Vitals:   11/24/20 1205 11/24/20 1206  BP: (!) 154/79   Pulse: 88   Resp: 20   Temp: 98.7 F (37.1 C)   TempSrc: Oral    SpO2: 94%   Weight:  165 lb 5.5 oz (75 kg)  Height:  5\' 2"  (1.575 m)   General appearance: Alert in no acute distress HEENT: NCAT.  Oropharynx clear.  Lungs: clear to auscultation bilaterally without adventitious breath sounds Heart: regular rate and rhythm.   Abdomen: soft; non-distended; no tenderness; bowel sounds present; no guarding Back: no  CVA tenderness Extremities: no edema; symmetrical with no gross deformities Skin: warm and dry Neurologic: Ambulates from chair to exam table without difficulty Psychological: alert and cooperative; normal mood and affect  Labs Reviewed  POCT URINALYSIS DIP (MANUAL ENTRY) - Abnormal; Notable for the following components:      Result Value   Color, UA light yellow (*)    Clarity, UA cloudy (*)    Blood, UA trace-intact (*)    Nitrite, UA Positive (*)    Leukocytes, UA Large (3+) (*)    All other components within normal limits    ASSESSMENT & PLAN:  1. Acute cystitis with hematuria     Meds ordered this encounter  Medications   sulfamethoxazole-trimethoprim (BACTRIM DS) 800-160 MG tablet    Sig: Take 1 tablet by mouth 2 (two) times daily for 7 days.    Dispense:  14 tablet    Refill:  0    Order Specific Question:   Supervising Provider    Answer:   Raylene Everts [[id number]]   Urine concerning for UTI Urine culture sent.  We will call you with the results.   Push fluids and get plenty of rest.   Take antibiotic as directed and to completion Follow up with PCP if symptoms persists Return here or go to ER if you have any new or worsening symptoms such as fever, worsening abdominal pain, nausea/vomiting, flank pain, etc...  Outlined signs and symptoms indicating need for more acute intervention. Patient verbalized understanding. After Visit Summary given.      Lestine Box, PA-C 11/24/20 1303

## 2020-11-25 LAB — URINE CULTURE: Culture: >=100000 — AB

## 2020-11-28 ENCOUNTER — Telehealth: Payer: Self-pay | Admitting: *Deleted

## 2020-11-28 NOTE — Telephone Encounter (Signed)
[name] with [facility] did UA they have standing orders this has came back with infection wanted to make sure we got the orders they faxed return call at [phone]

## 2020-11-29 NOTE — Telephone Encounter (Signed)
She went to urgent care and was prescribed bactrim on 7/14. Other than that, I don't see any U/A in her chart. When was this completed?

## 2020-11-29 NOTE — Telephone Encounter (Signed)
Spoke with Janett Billow, she will fax this to [place] now.

## 2020-11-29 NOTE — Telephone Encounter (Signed)
Did you see these by chance? Please advise.

## 2020-12-13 DIAGNOSIS — Z20822 Contact with and (suspected) exposure to covid-19: Secondary | ICD-10-CM | POA: Diagnosis not present

## 2020-12-28 ENCOUNTER — Other Ambulatory Visit: Payer: Self-pay

## 2020-12-28 ENCOUNTER — Ambulatory Visit
Admission: EM | Admit: 2020-12-28 | Discharge: 2020-12-28 | Disposition: A | Discharge status: Home / Self Care | Payer: Medicare Other | Attending: Family Medicine | Admitting: Family Medicine

## 2020-12-28 DIAGNOSIS — R059 Cough, unspecified: Secondary | ICD-10-CM | POA: Diagnosis not present

## 2020-12-28 DIAGNOSIS — R300 Dysuria: Secondary | ICD-10-CM

## 2020-12-28 LAB — POCT URINALYSIS DIP (MANUAL ENTRY)
Bilirubin, UA: NEGATIVE
Blood, UA: NEGATIVE
Glucose, UA: NEGATIVE mg/dL
Ketones, POC UA: NEGATIVE mg/dL
Nitrite, UA: NEGATIVE
Protein Ur, POC: NEGATIVE mg/dL
Spec Grav, UA: 1.02 (ref 1.010–1.025)
Urobilinogen, UA: 0.2 E.U./dL
pH, UA: 5.5 (ref 5.0–8.0)

## 2020-12-28 MED ORDER — CEPHALEXIN 500 MG PO CAPS: 500.0000 mg | ORAL_CAPSULE | Freq: Two times a day (BID) | ORAL | 0 refills | Status: DC

## 2020-12-28 NOTE — ED Triage Notes (Signed)
Pt presents with cough exacerbation and dysuria. Pt states she has chronic uti

## 2020-12-29 NOTE — ED Provider Notes (Signed)
[place]   [id number] 12/28/20 Arrival Time: [id number]  ASSESSMENT & PLAN:  1. Dysuria   2. Cough   Chronic UTI reported; acute exacerbation.  Labs Reviewed  POCT URINALYSIS DIP (MANUAL ENTRY) - Abnormal; Notable for the following components:      Result Value   Leukocytes, UA Trace (*)    All other components within normal limits  URINE CULTURE   Urine culture pending.  No signs of PNA.  Begin: Meds ordered this encounter  Medications   cephALEXin (KEFLEX) 500 MG capsule    Sig: Take 1 capsule (500 mg total) by mouth 2 (two) times daily.    Dispense:  10 capsule    Refill:  0     Follow-up Information     [name], NP.   Specialty: Nurse Practitioner Why: If worsening or failing to improve as anticipated. Contact information: [address]  [place] [address] [phone]                 Reviewed expectations re: course of current medical issues. Questions answered. Outlined signs and symptoms indicating need for more acute intervention. Understanding verbalized. After Visit Summary given.   SUBJECTIVE: History from: patient. Brynlyn Bafford Aikey is a 85 y.o. female who reports mild cough exacerbation over past few days but feels a little better today. Also with dysuria, chronic UTI with reported exacerbation. Denies: fever and difficulty breathing. No abd pain. Normal PO intake without n/v/d.   OBJECTIVE:  Vitals:   12/28/20 1409  BP: 105/63  Pulse: 88  Resp: (!) 22  Temp: 98.3 F (36.8 C)  SpO2: 93%    General appearance: alert; no distress Eyes: PERRLA; EOMI; conjunctiva normal HENT: [place]; AT; with mild nasal congestion Neck: supple  Lungs: speaks full sentences without difficulty; unlabored; no wheezing Abd: benign Extremities: no edema Skin: warm and dry Neurologic: normal gait Psychological: alert and cooperative; normal mood and affect  Labs: Results for orders placed or performed during the  hospital encounter of 12/28/20  POCT urinalysis dipstick  Result Value Ref Range   Color, UA yellow yellow   Clarity, UA clear clear   Glucose, UA negative negative mg/dL   Bilirubin, UA negative negative   Ketones, POC UA negative negative mg/dL   Spec Grav, UA 1.020 1.010 - 1.025   Blood, UA negative negative   pH, UA 5.5 5.0 - 8.0   Protein Ur, POC negative negative mg/dL   Urobilinogen, UA 0.2 0.2 or 1.0 E.U./dL   Nitrite, UA Negative Negative   Leukocytes, UA Trace (A) Negative   Labs Reviewed  POCT URINALYSIS DIP (MANUAL ENTRY) - Abnormal; Notable for the following components:      Result Value   Leukocytes, UA Trace (*)    All other components within normal limits  URINE CULTURE    Allergies  Allergen Reactions   Plavix [Clopidogrel Bisulfate] Itching    Severe itching   Alprazolam Nausea And Vomiting   Codeine Nausea And Vomiting   Percodan [Oxycodone-Aspirin] Nausea And Vomiting   Valium Nausea And Vomiting    Past Medical History:  Diagnosis Date   Acute on chronic respiratory failure with hypoxia (HCC) 04/24/2018   Anxiety    Atypical chest pain    Bronchospasm    CAD (coronary artery disease)    a. s/p DES to mid-LAD and DES to mid-OM1 in 11/2016   Cervical disc disorder with myelopathy, unspecified cervical region    Cervicalgia 01/19/2009  Qualifier: Diagnosis of  By: Aline Brochure MD, Stanley     Chest pain at rest [id number]   Chronic systolic (congestive) heart failure (HCC)    COPD (chronic obstructive pulmonary disease) ([place])    Coronary artery disease    De Quervain's disease (tenosynovitis) 04/02/2011   Disc disease with myelopathy, cervical    Essential hypertension    Hemorrhoids    Liver mass    Lung, cysts, congenital    Left lung cyst   Myocardial infarction ([place])    Nephrolithiasis    Embedded   Nonischemic cardiomyopathy ([place])    LVEF 35-40% 2015   On home O2    Osteoarthritis    Sprain of wrist 08/07/2012   Thoracic ascending aortic  aneurysm (HCC)    4.3 cm April 2016   Social History   Socioeconomic History   Marital status: Widowed    Spouse name: Not on file   Number of children: Not on file   Years of education: 9th   Highest education level: Not on file  Occupational History    Employer: RETIRED  Tobacco Use   Smoking status: Former    Packs/day: 0.25    Years: 3.00    Pack years: 0.75    Types: Cigarettes    Start date: 05/14/1977    Quit date: 05/28/2008    Years since quitting: 12.5   Smokeless tobacco: Never   Tobacco comments:    patient states she only smoked for 3 years total  Vaping Use   Vaping Use: Never used  Substance and Sexual Activity   Alcohol use: No    Alcohol/week: 0.0 standard drinks   Drug use: No   Sexual activity: Never  Other Topics Concern   Not on file  Social History Narrative   Not on file   Social Determinants of Health   Financial Resource Strain: Not on file  Food Insecurity: Not on file  Transportation Needs: Not on file  Physical Activity: Not on file  Stress: Not on file  Social Connections: Not on file  Intimate Partner Violence: Not on file   Family History  Problem Relation Age of Onset   Heart disease Mother    Aneurysm Father    Lung cancer Brother    Heart disease Sister    Diabetes Brother    Heart disease Brother    Past Surgical History:  Procedure Laterality Date   Benign breast tumors     CHOLECYSTECTOMY     COLONOSCOPY     COLONOSCOPY N/A 09/22/2014   Procedure: COLONOSCOPY;  Surgeon: Rogene Houston, MD;  [facility];  Service: Endoscopy;  Laterality: N/A;  830 -- to be done in OR under fluoro   Complete hysterectomy     CORONARY STENT INTERVENTION N/A 11/23/2016   Procedure: Coronary Stent Intervention;  Surgeon: Martinique, Peter M, MD;  Location: [facility];  Service: Cardiovascular;  Laterality: N/A;   LEFT HEART CATH AND CORONARY ANGIOGRAPHY N/A 11/23/2016   Procedure: Left Heart Cath and Coronary Angiography;   Surgeon: Martinique, Peter M, MD;  Location: [facility];  Service: Cardiovascular;  Laterality: N/A;   TONSILLECTOMY AND ADENOIDECTOMY       Vanessa Kick, MD 12/29/20 1342

## 2020-12-31 LAB — URINE CULTURE: Culture: 40000 — AB

## 2021-01-02 ENCOUNTER — Telehealth (HOSPITAL_COMMUNITY): Payer: Self-pay | Admitting: Emergency Medicine

## 2021-01-02 MED ORDER — FOSFOMYCIN TROMETHAMINE 3 G PO PACK: 3.0000 g | PACK | Freq: Once | ORAL | 0 refills | Status: AC

## 2021-01-03 ENCOUNTER — Telehealth: Payer: Self-pay

## 2021-01-03 NOTE — Telephone Encounter (Signed)
Patient called said [place] waiting on an prior authorization on her medicine, but patient did not know what the medicine was and said the nurse can call [employer].

## 2021-01-03 NOTE — Telephone Encounter (Signed)
Spoke with Vaughan Basta at [facility], she is going to send [place] the PA form to do on Cover My Meds.

## 2021-01-06 ENCOUNTER — Other Ambulatory Visit: Payer: Self-pay

## 2021-01-06 ENCOUNTER — Ambulatory Visit (INDEPENDENT_AMBULATORY_CARE_PROVIDER_SITE_OTHER): Payer: Medicare Other

## 2021-01-06 DIAGNOSIS — Z0000 Encounter for general adult medical examination without abnormal findings: Secondary | ICD-10-CM

## 2021-01-06 NOTE — Progress Notes (Signed)
Subjective:   [name] is a 85 y.o. female who presents for Medicare Annual (Subsequent) preventive examination.         Objective:    There were no vitals filed for this visit. There is no height or weight on file to calculate BMI.  Advanced Directives 10/08/2020 01/16/2019 12/16/2018 12/16/2018 10/29/2018 07/25/2018 04/24/2018  Does Patient Have a Medical Advance Directive? No No Yes Yes No No -  Type of Advance Directive - Scientist, water quality of [place];Living will - - -  Does patient want to make changes to medical advance directive? - - No - Guardian declined - - - No - Patient declined  Copy of [place] in Chart? - - No - copy requested - - - -  Would patient like information on creating a medical advance directive? No - Patient declined No - Patient declined - - - No - Patient declined -  Pre-existing out of facility DNR order (yellow form or pink MOST form) - - - - - - -    Current Medications (verified) Outpatient Encounter Medications as of 01/06/2021  Medication Sig   albuterol (PROVENTIL) (2.5 MG/3ML) 0.083% nebulizer solution Take 3 mLs (2.5 mg total) by nebulization every 6 (six) hours as needed for wheezing or shortness of breath.   albuterol (VENTOLIN HFA) 108 (90 Base) MCG/ACT inhaler Inhale 2 puffs into the lungs every 4 (four) hours as needed for wheezing or shortness of breath.   aspirin EC 81 MG tablet Take 81 mg by mouth every morning.    carvedilol (COREG) 12.5 MG tablet TAKE (1) TABLET BY MOUTH TWICE DAILY.   cephALEXin (KEFLEX) 500 MG capsule Take 1 capsule (500 mg total) by mouth 2 (two) times daily.   clorazepate (TRANXENE) 7.5 MG tablet Take 1 tablet (7.5 mg total) by mouth daily as needed for anxiety. For nerves   ENTRESTO 24-26 MG TAKE 1 TABLET BY MOUTH TWICE DAILY.   fluticasone (FLONASE) 50 MCG/ACT nasal spray Place 1 spray into both nostrils daily.   Fluticasone-Umeclidin-Vilant (TRELEGY ELLIPTA)  100-62.5-25 MCG/INH AEPB Inhale 1 puff into the lungs daily.   meclizine (ANTIVERT) 25 MG tablet Take 25 mg by mouth as needed for dizziness.   nitroGLYCERIN (NITROSTAT) 0.4 MG SL tablet Place 1 tablet (0.4 mg total) under the tongue every 5 (five) minutes x 3 doses as needed for chest pain.   ondansetron (ZOFRAN ODT) 4 MG disintegrating tablet Take 1 tablet (4 mg total) by mouth every 8 (eight) hours as needed for nausea.   OXYGEN Inhale 2 L into the lungs at bedtime.   pantoprazole (PROTONIX) 40 MG tablet Take 1 tablet (40 mg total) by mouth daily.   traMADol (ULTRAM) 50 MG tablet Take 1 tablet (50 mg total) by mouth daily as needed for severe pain. Maximum dose= 8 tablets per day   No facility-administered encounter medications on file as of 01/06/2021.    Allergies (verified) Plavix [clopidogrel bisulfate], Alprazolam, Codeine, Percodan [oxycodone-aspirin], and Valium   History: Past Medical History:  Diagnosis Date   Acute on chronic respiratory failure with hypoxia ([place]) 04/24/2018   Anxiety    Atypical chest pain    Bronchospasm    CAD (coronary artery disease)    a. s/p DES to mid-LAD and DES to mid-OM1 in 11/2016   Cervical disc disorder with myelopathy, unspecified cervical region    Cervicalgia 01/19/2009   Qualifier: Diagnosis of  By: Aline Brochure MD, Dorothyann Peng  Chest pain at rest [id number]   Chronic systolic (congestive) heart failure (HCC)    COPD (chronic obstructive pulmonary disease) (HCC)    Coronary artery disease    De Quervain's disease (tenosynovitis) 04/02/2011   Disc disease with myelopathy, cervical    Essential hypertension    Hemorrhoids    Liver mass    Lung, cysts, congenital    Left lung cyst   Myocardial infarction ([place])    Nephrolithiasis    Embedded   Nonischemic cardiomyopathy ([place])    LVEF 35-40% 2015   On home O2    Osteoarthritis    Sprain of wrist 08/07/2012   Thoracic ascending aortic aneurysm (HCC)    4.3 cm April 2016   Past Surgical  History:  Procedure Laterality Date   Benign breast tumors     CHOLECYSTECTOMY     COLONOSCOPY     COLONOSCOPY N/A 09/22/2014   Procedure: COLONOSCOPY;  Surgeon: Rogene Houston, MD;  [facility];  Service: Endoscopy;  Laterality: N/A;  830 -- to be done in OR under fluoro   Complete hysterectomy     CORONARY STENT INTERVENTION N/A 11/23/2016   Procedure: Coronary Stent Intervention;  Surgeon: Martinique, Peter M, MD;  Location: [facility];  Service: Cardiovascular;  Laterality: N/A;   LEFT HEART CATH AND CORONARY ANGIOGRAPHY N/A 11/23/2016   Procedure: Left Heart Cath and Coronary Angiography;  Surgeon: Martinique, Peter M, MD;  Location: [place];  Service: Cardiovascular;  Laterality: N/A;   TONSILLECTOMY AND ADENOIDECTOMY     Family History  Problem Relation Age of Onset   Heart disease Mother    Aneurysm Father    Lung cancer Brother    Heart disease Sister    Diabetes Brother    Heart disease Brother    Social History   Socioeconomic History   Marital status: Widowed    Spouse name: Not on file   Number of children: Not on file   Years of education: 9th   Highest education level: Not on file  Occupational History    Employer: RETIRED  Tobacco Use   Smoking status: Former    Packs/day: 0.25    Years: 3.00    Pack years: 0.75    Types: Cigarettes    Start date: 05/14/1977    Quit date: 05/28/2008    Years since quitting: 12.6   Smokeless tobacco: Never   Tobacco comments:    patient states she only smoked for 3 years total  Vaping Use   Vaping Use: Never used  Substance and Sexual Activity   Alcohol use: No    Alcohol/week: 0.0 standard drinks   Drug use: No   Sexual activity: Never  Other Topics Concern   Not on file  Social History Narrative   Not on file   Social Determinants of Health   Financial Resource Strain: Not on file  Food Insecurity: Not on file  Transportation Needs: Not on file  Physical Activity: Not on file  Stress:  Not on file  Social Connections: Not on file    Tobacco Counseling Counseling given: Not Answered Tobacco comments: patient states she only smoked for 3 years total   Clinical Intake:                 Diabetic? No          Activities of Daily Living In your present state of health, do you have any difficulty performing the following activities: 10/09/2020  05/24/2020  Hearing? N Y  Vision? N N  Difficulty concentrating or making decisions? N N  Walking or climbing stairs? N Y  Dressing or bathing? N N  Doing errands, shopping? N Y  Some recent data might be hidden    Patient Care Team: [name], NP as PCP - General (Nurse Practitioner) Herminio Commons, MD (Inactive) as PCP - Cardiology (Cardiology)  Indicate any recent Medical Services you may have received from other than [facility] providers in the past year (date may be approximate).     Assessment:   This is a routine wellness examination for Ilya.  Hearing/Vision screen No results found.  Dietary issues and exercise activities discussed:     Goals Addressed   None   Depression Screen PHQ 2/9 Scores 10/20/2020 07/20/2020 06/10/2020 05/27/2020 05/24/2020 04/27/2020 03/28/2020  PHQ - 2 Score '1 5 2 '$ 0 0 3 2  PHQ- 9 Score - 8 5 0 - 6 6    Fall Risk Fall Risk  10/20/2020 07/20/2020 06/10/2020 05/27/2020 05/24/2020  Falls in the past year? 0 0 0 0 0  Comment - - - - -  Number falls in past yr: 0 0 0 0 0  Comment - - - - -  Injury with Fall? 0 0 0 0 0  Risk for fall due to : No Fall Risks No Fall Risks No Fall Risks No Fall Risks No Fall Risks  Follow up Falls evaluation completed Falls evaluation completed Falls evaluation completed Falls evaluation completed Falls evaluation completed    FALL RISK PREVENTION PERTAINING TO THE HOME:  Any stairs in or around the home? No  If so, are there any without handrails? No  Home free of loose throw rugs in walkways, pet beds, electrical cords, etc? Yes  Adequate  lighting in your home to reduce risk of falls? Yes   ASSISTIVE DEVICES UTILIZED TO PREVENT FALLS:  Life alert? No  Use of a cane, walker or w/c? Yes  Grab bars in the bathroom? No  Shower chair or bench in shower? No  Elevated toilet seat or a handicapped toilet? No   TIMED UP AND GO:  Was the test performed? No .           Immunizations Immunization History  Administered Date(s) Administered   Influenza, High Dose Seasonal PF 04/03/2017, 04/28/2018, 03/15/2019   Influenza-Unspecified 12/13/2014   Pneumococcal Polysaccharide-23 07/12/2015    TDAP status: Due, Education has been provided regarding the importance of this vaccine. Advised may receive this vaccine at local pharmacy or Health Dept. Aware to provide a copy of the vaccination record if obtained from local pharmacy or Health Dept. Verbalized acceptance and understanding.  Flu Vaccine status: Due, Education has been provided regarding the importance of this vaccine. Advised may receive this vaccine at local pharmacy or Health Dept. Aware to provide a copy of the vaccination record if obtained from local pharmacy or Health Dept. Verbalized acceptance and understanding.  Pneumococcal vaccine status: Due, Education has been provided regarding the importance of this vaccine. Advised may receive this vaccine at local pharmacy or Health Dept. Aware to provide a copy of the vaccination record if obtained from local pharmacy or Health Dept. Verbalized acceptance and understanding.  Covid-19 vaccine status: Declined, Education has been provided regarding the importance of this vaccine but patient still declined. Advised may receive this vaccine at local pharmacy or Health Dept.or vaccine clinic. Aware to provide a copy of the vaccination record if obtained from  local pharmacy or Health Dept. Verbalized acceptance and understanding.  Qualifies for Shingles Vaccine? Yes   Zostavax completed No   Shingrix Completed?: No.    Education  has been provided regarding the importance of this vaccine. Patient has been advised to call insurance company to determine out of pocket expense if they have not yet received this vaccine. Advised may also receive vaccine at local pharmacy or Health Dept. Verbalized acceptance and understanding.  Screening Tests Health Maintenance  Topic Date Due   COVID-19 Vaccine (1) Never done   TETANUS/TDAP  Never done   Zoster Vaccines- Shingrix (1 of 2) Never done   PNA vac Low Risk Adult (2 of 2 - PCV13) 07/11/2016   INFLUENZA VACCINE  12/12/2020   DEXA SCAN  Completed   HPV VACCINES  Aged Out    Health Maintenance  Health Maintenance Due  Topic Date Due   COVID-19 Vaccine (1) Never done   TETANUS/TDAP  Never done   Zoster Vaccines- Shingrix (1 of 2) Never done   PNA vac Low Risk Adult (2 of 2 - PCV13) 07/11/2016   INFLUENZA VACCINE  12/12/2020    Colorectal cancer screening: No longer required.   Mammogram status: No longer required due to normal testing.  Bone Density status: Completed normal. Results reflect: Bone density results: NORMAL. Repeat every 10 years.  Lung Cancer Screening: (Low Dose CT Chest recommended if Age 18-80 years, 30 pack-year currently smoking OR have quit w/in 15years.) does not qualify.     Additional Screening:  Hepatitis C Screening: Completed.   Vision Screening: Recommended annual ophthalmology exams for early detection of glaucoma and other disorders of the eye. Is the patient up to date with their annual eye exam?  Yes  Who is the provider or what is the name of the office in which the patient attends annual eye exams? Dr. Jorja Loa   If pt is not established with a provider, would they like to be referred to a provider to establish care? No .   Dental Screening: Recommended annual dental exams for proper oral hygiene  Community Resource Referral / Chronic Care Management: CRR required this visit?  No   CCM required this visit?  No       Plan:     I have personally reviewed and noted the following in the patient's chart:   Medical and social history Use of alcohol, tobacco or illicit drugs  Current medications and supplements including opioid prescriptions.  Functional ability and status Nutritional status Physical activity Advanced directives List of other physicians Hospitalizations, surgeries, and ER visits in previous 12 months Vitals Screenings to include cognitive, depression, and falls Referrals and appointments  In addition, I have reviewed and discussed with patient certain preventive protocols, quality metrics, and best practice recommendations. A written personalized care plan for preventive services as well as general preventive health recommendations were provided to patient.     Lonn Georgia, LPN   [id number]   Nurse Notes: AWV conducted over the phone with pt consent to televisit via audio. Pt was at her home at the time of visit and provider located off site. This call took approx. 30 min to conduct.

## 2021-01-06 NOTE — Patient Instructions (Signed)
[name] , Thank you for taking time to come for your Medicare Wellness Visit. I appreciate your ongoing commitment to your health goals. Please review the following plan we discussed and let me know if I can assist you in the future.   Screening recommendations/referrals: Colonoscopy: No longer required  Mammogram: No longer required  Bone Density: Completed  Recommended yearly ophthalmology/optometry visit for glaucoma screening and checkup Recommended yearly dental visit for hygiene and checkup  Vaccinations: Influenza vaccine: DUE  Pneumococcal vaccine: Complete  Tdap vaccine: up to date  Shingles vaccine: eligible, please check with pharmacy for pricing.     Advanced directives: N/A   Conditions/risks identified: None   Next appointment: 02/20/2021 @ 10:00am    Preventive Care 65 Years and Older, Female Preventive care refers to lifestyle choices and visits with your health care provider that can promote health and wellness. What does preventive care include? A yearly physical exam. This is also called an annual well check. Dental exams once or twice a year. Routine eye exams. Ask your health care provider how often you should have your eyes checked. Personal lifestyle choices, including: Daily care of your teeth and gums. Regular physical activity. Eating a healthy diet. Avoiding tobacco and drug use. Limiting alcohol use. Practicing safe sex. Taking low-dose aspirin every day. Taking vitamin and mineral supplements as recommended by your health care provider. What happens during an annual well check? The services and screenings done by your health care provider during your annual well check will depend on your age, overall health, lifestyle risk factors, and family history of disease. Counseling  Your health care provider may ask you questions about your: Alcohol use. Tobacco use. Drug use. Emotional well-being. Home and relationship well-being. Sexual  activity. Eating habits. History of falls. Memory and ability to understand (cognition). Work and work Statistician. Reproductive health. Screening  You may have the following tests or measurements: Height, weight, and BMI. Blood pressure. Lipid and cholesterol levels. These may be checked every 5 years, or more frequently if you are over 47 years old. Skin check. Lung cancer screening. You may have this screening every year starting at age 92 if you have a 30-pack-year history of smoking and currently smoke or have quit within the past 15 years. Fecal occult blood test (FOBT) of the stool. You may have this test every year starting at age 54. Flexible sigmoidoscopy or colonoscopy. You may have a sigmoidoscopy every 5 years or a colonoscopy every 10 years starting at age 51. Hepatitis C blood test. Hepatitis B blood test. Sexually transmitted disease (STD) testing. Diabetes screening. This is done by checking your blood sugar (glucose) after you have not eaten for a while (fasting). You may have this done every 1-3 years. Bone density scan. This is done to screen for osteoporosis. You may have this done starting at age 54. Mammogram. This may be done every 1-2 years. Talk to your health care provider about how often you should have regular mammograms. Talk with your health care provider about your test results, treatment options, and if necessary, the need for more tests. Vaccines  Your health care provider may recommend certain vaccines, such as: Influenza vaccine. This is recommended every year. Tetanus, diphtheria, and acellular pertussis (Tdap, Td) vaccine. You may need a Td booster every 10 years. Zoster vaccine. You may need this after age 1. Pneumococcal 13-valent conjugate (PCV13) vaccine. One dose is recommended after age 26. Pneumococcal polysaccharide (PPSV23) vaccine. One dose is recommended after age  37. Talk to your health care provider about which screenings and vaccines  you need and how often you need them. This information is not intended to replace advice given to you by your health care provider. Make sure you discuss any questions you have with your health care provider. Document Released: 05/27/2015 Document Revised: 01/18/2016 Document Reviewed: 03/01/2015 Elsevier Interactive Patient Education  2017 [employer] [employer] Prevention in the Home Falls can cause injuries. They can happen to people of all ages. There are many things you can do to make your home safe and to help prevent falls. What can I do on the outside of my home? Regularly fix the edges of walkways and driveways and fix any cracks. Remove anything that might make you trip as you walk through a door, such as a raised step or threshold. Trim any bushes or trees on the path to your home. Use bright outdoor lighting. Clear any walking paths of anything that might make someone trip, such as rocks or tools. Regularly check to see if handrails are loose or broken. Make sure that both sides of any steps have handrails. Any raised decks and porches should have guardrails on the edges. Have any leaves, snow, or ice cleared regularly. Use sand or salt on walking paths during winter. Clean up any spills in your garage right away. This includes oil or grease spills. What can I do in the bathroom? Use night lights. Install grab bars by the toilet and in the tub and shower. Do not use towel bars as grab bars. Use non-skid mats or decals in the tub or shower. If you need to sit down in the shower, use a plastic, non-slip stool. Keep the floor dry. Clean up any water that spills on the floor as soon as it happens. Remove soap buildup in the tub or shower regularly. Attach bath mats securely with double-sided non-slip rug tape. Do not have throw rugs and other things on the floor that can make you trip. What can I do in the bedroom? Use night lights. Make sure that you have a light by your bed that  is easy to reach. Do not use any sheets or blankets that are too big for your bed. They should not hang down onto the floor. Have a firm chair that has side arms. You can use this for support while you get dressed. Do not have throw rugs and other things on the floor that can make you trip. What can I do in the kitchen? Clean up any spills right away. Avoid walking on wet floors. Keep items that you use a lot in easy-to-reach places. If you need to reach something above you, use a strong step stool that has a grab bar. Keep electrical cords out of the way. Do not use floor polish or wax that makes floors slippery. If you must use wax, use non-skid floor wax. Do not have throw rugs and other things on the floor that can make you trip. What can I do with my stairs? Do not leave any items on the stairs. Make sure that there are handrails on both sides of the stairs and use them. Fix handrails that are broken or loose. Make sure that handrails are as long as the stairways. Check any carpeting to make sure that it is firmly attached to the stairs. Fix any carpet that is loose or worn. Avoid having throw rugs at the top or bottom of the stairs. If you do have  throw rugs, attach them to the floor with carpet tape. Make sure that you have a light switch at the top of the stairs and the bottom of the stairs. If you do not have them, ask someone to add them for you. What else can I do to help prevent falls? Wear shoes that: Do not have high heels. Have rubber bottoms. Are comfortable and fit you well. Are closed at the toe. Do not wear sandals. If you use a stepladder: Make sure that it is fully opened. Do not climb a closed stepladder. Make sure that both sides of the stepladder are locked into place. Ask someone to hold it for you, if possible. Clearly mark and make sure that you can see: Any grab bars or handrails. First and last steps. Where the edge of each step is. Use tools that help you  move around (mobility aids) if they are needed. These include: Canes. Walkers. Scooters. Crutches. Turn on the lights when you go into a dark area. Replace any light bulbs as soon as they burn out. Set up your furniture so you have a clear path. Avoid moving your furniture around. If any of your floors are uneven, fix them. If there are any pets around you, be aware of where they are. Review your medicines with your doctor. Some medicines can make you feel dizzy. This can increase your chance of falling. Ask your doctor what other things that you can do to help prevent falls. This information is not intended to replace advice given to you by your health care provider. Make sure you discuss any questions you have with your health care provider. Document Released: 02/24/2009 Document Revised: 10/06/2015 Document Reviewed: 06/04/2014 Elsevier Interactive Patient Education  2017 [employer].

## 2021-01-13 ENCOUNTER — Other Ambulatory Visit: Payer: Self-pay | Admitting: Nurse Practitioner

## 2021-01-13 DIAGNOSIS — F419 Anxiety disorder, unspecified: Secondary | ICD-10-CM

## 2021-01-17 ENCOUNTER — Telehealth: Payer: Self-pay

## 2021-01-17 ENCOUNTER — Other Ambulatory Visit: Payer: Self-pay | Admitting: Nurse Practitioner

## 2021-01-17 NOTE — Telephone Encounter (Signed)
She needs appt

## 2021-01-17 NOTE — Telephone Encounter (Signed)
Patient calling requesting refill on tramadol ph# [phone]

## 2021-01-24 ENCOUNTER — Other Ambulatory Visit: Payer: Self-pay

## 2021-01-24 ENCOUNTER — Encounter: Payer: Self-pay | Admitting: Nurse Practitioner

## 2021-01-24 ENCOUNTER — Ambulatory Visit (INDEPENDENT_AMBULATORY_CARE_PROVIDER_SITE_OTHER): Payer: Medicare Other | Admitting: Nurse Practitioner

## 2021-01-24 VITALS — BP 126/59 | HR 78 | Temp 98.7°F | Ht 62.0 in | Wt 173.0 lb

## 2021-01-24 DIAGNOSIS — I10 Essential (primary) hypertension: Secondary | ICD-10-CM | POA: Diagnosis not present

## 2021-01-24 DIAGNOSIS — R7301 Impaired fasting glucose: Secondary | ICD-10-CM | POA: Diagnosis not present

## 2021-01-24 DIAGNOSIS — E782 Mixed hyperlipidemia: Secondary | ICD-10-CM | POA: Insufficient documentation

## 2021-01-24 DIAGNOSIS — R21 Rash and other nonspecific skin eruption: Secondary | ICD-10-CM | POA: Diagnosis not present

## 2021-01-24 DIAGNOSIS — E7800 Pure hypercholesterolemia, unspecified: Secondary | ICD-10-CM | POA: Diagnosis not present

## 2021-01-24 DIAGNOSIS — E785 Hyperlipidemia, unspecified: Secondary | ICD-10-CM | POA: Insufficient documentation

## 2021-01-24 MED ORDER — TRIAMCINOLONE ACETONIDE 0.1 % EX CREA: 1.0000 "application " | TOPICAL_CREAM | Freq: Two times a day (BID) | CUTANEOUS | 0 refills | Status: DC

## 2021-01-24 NOTE — Assessment & Plan Note (Signed)
-  checking A1c with labs

## 2021-01-24 NOTE — Assessment & Plan Note (Signed)
-  signs of itching present -no hives to indicate acute allergic reaction, no bites visible -Rx. Triamcinolone cream; pt states she has back scratcher that will reach spots on her back

## 2021-01-24 NOTE — Assessment & Plan Note (Signed)
Lab Results  Component Value Date   CHOL 213 (H) 10/20/2020   HDL 68 10/20/2020   LDLCALC 131 (H) 10/20/2020   TRIG 80 10/20/2020   CHOLHDL 2.2 12/19/2018   -no current statin or lipid-lowering agent; no documented statin intolerance/allergy

## 2021-01-24 NOTE — Patient Instructions (Signed)
Please have fasting labs drawn today. 

## 2021-01-24 NOTE — Assessment & Plan Note (Signed)
BP Readings from Last 3 Encounters:  01/24/21 (!) 126/59  12/28/20 105/63  11/24/20 (!) 154/79   -no change in meds today

## 2021-01-24 NOTE — Progress Notes (Signed)
Acute Office Visit  Subjective:    Patient ID: BAYLEA MILBURN, female    DOB: 06-06-1935, 85 y.o.   MRN: [id number]  Chief Complaint  Patient presents with   Follow-up    Has not had labs, is fasting today.   Rash    Ongoing x1 week, on back and front of chest, does itch.     Rash  Patient is in today for lab follow-up for HTN and HLD. She states that she has a rash that has been ongoing x1 week on her back and chest.  Other than rash, no acute concerns.  Past Medical History:  Diagnosis Date   Acute on chronic respiratory failure with hypoxia (HCC) 04/24/2018   Anxiety    Atypical chest pain    Bronchospasm    CAD (coronary artery disease)    a. s/p DES to mid-LAD and DES to mid-OM1 in 11/2016   Cervical disc disorder with myelopathy, unspecified cervical region    Cervicalgia 01/19/2009   Qualifier: Diagnosis of  By: Aline Brochure MD, Stanley     Chest pain at rest 3/71/6967   Chronic systolic (congestive) heart failure (HCC)    COPD (chronic obstructive pulmonary disease) (HCC)    Coronary artery disease    De Quervain's disease (tenosynovitis) 04/02/2011   Disc disease with myelopathy, cervical    Essential hypertension    Hemorrhoids    Liver mass    Lung, cysts, congenital    Left lung cyst   Myocardial infarction ([place])    Nephrolithiasis    Embedded   Nonischemic cardiomyopathy ([place])    LVEF 35-40% 2015   On home O2    Osteoarthritis    Sprain of wrist 08/07/2012   Thoracic ascending aortic aneurysm (HCC)    4.3 cm April 2016    Past Surgical History:  Procedure Laterality Date   Benign breast tumors     CHOLECYSTECTOMY     COLONOSCOPY     COLONOSCOPY N/A 09/22/2014   Procedure: COLONOSCOPY;  Surgeon: Rogene Houston, MD;  Location: [facility];  Service: Endoscopy;  Laterality: N/A;  830 -- to be done in OR under fluoro   Complete hysterectomy     CORONARY STENT INTERVENTION N/A 11/23/2016   Procedure: Coronary Stent Intervention;  Surgeon: Martinique,  Peter M, MD;  Location: [facility];  Service: Cardiovascular;  Laterality: N/A;   LEFT HEART CATH AND CORONARY ANGIOGRAPHY N/A 11/23/2016   Procedure: Left Heart Cath and Coronary Angiography;  Surgeon: Martinique, Peter M, MD;  Location: [facility];  Service: Cardiovascular;  Laterality: N/A;   TONSILLECTOMY AND ADENOIDECTOMY      Family History  Problem Relation Age of Onset   Heart disease Mother    Aneurysm Father    Lung cancer Brother    Heart disease Sister    Diabetes Brother    Heart disease Brother     Social History   Socioeconomic History   Marital status: Widowed    Spouse name: Not on file   Number of children: Not on file   Years of education: 9th   Highest education level: Not on file  Occupational History    Employer: RETIRED  Tobacco Use   Smoking status: Former    Packs/day: 0.25    Years: 3.00    Pack years: 0.75    Types: Cigarettes    Start date: 05/14/1977    Quit date: 05/28/2008    Years since quitting: 12.6  Smokeless tobacco: Never   Tobacco comments:    patient states she only smoked for 3 years total  Vaping Use   Vaping Use: Never used  Substance and Sexual Activity   Alcohol use: No    Alcohol/week: 0.0 standard drinks   Drug use: No   Sexual activity: Never  Other Topics Concern   Not on file  Social History Narrative   Not on file   Social Determinants of Health   Financial Resource Strain: Low Risk    Difficulty of Paying Living Expenses: Not very hard  Food Insecurity: No Food Insecurity   Worried About Running Out of Food in the Last Year: Never true   Ran Out of Food in the Last Year: Never true  Transportation Needs: No Transportation Needs   Lack of Transportation (Medical): No   Lack of Transportation (Non-Medical): No  Physical Activity: Inactive   Days of Exercise per Week: 0 days   Minutes of Exercise per Session: 0 min  Stress: Stress Concern Present   Feeling of Stress : Very much  Social  Connections: Socially Isolated   Frequency of Communication with Friends and Family: Three times a week   Frequency of Social Gatherings with Friends and Family: Twice a week   Attends Religious Services: Never   Marine scientist or Organizations: No   Attends Archivist Meetings: Never   Marital Status: Widowed  Human resources officer Violence: Not At Risk   Fear of Current or Ex-Partner: No   Emotionally Abused: No   Physically Abused: No   Sexually Abused: No    Outpatient Medications Prior to Visit  Medication Sig Dispense Refill   albuterol (PROVENTIL) (2.5 MG/3ML) 0.083% nebulizer solution Take 3 mLs (2.5 mg total) by nebulization every 6 (six) hours as needed for wheezing or shortness of breath. 75 mL 12   albuterol (VENTOLIN HFA) 108 (90 Base) MCG/ACT inhaler Inhale 2 puffs into the lungs every 4 (four) hours as needed for wheezing or shortness of breath. 18 g 5   aspirin EC 81 MG tablet Take 81 mg by mouth every morning.      carvedilol (COREG) 12.5 MG tablet TAKE (1) TABLET BY MOUTH TWICE DAILY. 180 tablet 3   clorazepate (TRANXENE) 7.5 MG tablet Take 1 tablet (7.5 mg total) by mouth daily as needed for anxiety. For nerves 15 tablet 0   ENTRESTO 24-26 MG TAKE 1 TABLET BY MOUTH TWICE DAILY. 60 tablet 6   Fluticasone-Umeclidin-Vilant (TRELEGY ELLIPTA) 100-62.5-25 MCG/INH AEPB Inhale 1 puff into the lungs daily. 60 each 11   meclizine (ANTIVERT) 25 MG tablet Take 25 mg by mouth as needed for dizziness.     nitroGLYCERIN (NITROSTAT) 0.4 MG SL tablet Place 1 tablet (0.4 mg total) under the tongue every 5 (five) minutes x 3 doses as needed for chest pain. 25 tablet 3   OXYGEN Inhale 2 L into the lungs at bedtime.     traMADol (ULTRAM) 50 MG tablet Take 1 tablet (50 mg total) by mouth daily as needed for severe pain. Maximum dose= 8 tablets per day 15 tablet 0   cephALEXin (KEFLEX) 500 MG capsule Take 1 capsule (500 mg total) by mouth 2 (two) times daily. (Patient not taking:  Reported on 01/24/2021) 10 capsule 0   fluticasone (FLONASE) 50 MCG/ACT nasal spray Place 1 spray into both nostrils daily. (Patient not taking: Reported on 01/24/2021) 16 g 2   ondansetron (ZOFRAN ODT) 4 MG disintegrating tablet Take 1 tablet (  4 mg total) by mouth every 8 (eight) hours as needed for nausea. (Patient not taking: Reported on 01/24/2021) 10 tablet 0   pantoprazole (PROTONIX) 40 MG tablet Take 1 tablet (40 mg total) by mouth daily. (Patient not taking: Reported on 01/24/2021) 30 tablet 1   No facility-administered medications prior to visit.    Allergies  Allergen Reactions   Plavix [Clopidogrel Bisulfate] Itching    Severe itching   Alprazolam Nausea And Vomiting   Codeine Nausea And Vomiting   Percodan [Oxycodone-Aspirin] Nausea And Vomiting   Valium Nausea And Vomiting    Review of Systems  Constitutional: Negative.   Respiratory: Negative.    Cardiovascular: Negative.   Skin:  Positive for rash.  Psychiatric/Behavioral: Negative.        Objective:    Physical Exam Constitutional:      Appearance: Normal appearance.  Cardiovascular:     Rate and Rhythm: Normal rate and regular rhythm.     Pulses: Normal pulses.     Heart sounds: Murmur heard.  Pulmonary:     Effort: Pulmonary effort is normal.     Breath sounds: Normal breath sounds.  Neurological:     Mental Status: She is alert.  Psychiatric:        Mood and Affect: Mood normal.        Behavior: Behavior normal.        Thought Content: Thought content normal.        Judgment: Judgment normal.    BP (!) 126/59 (BP Location: Left Arm, Patient Position: Sitting, Cuff Size: Large)   Pulse 78   Temp 98.7 F (37.1 C) (Oral)   Ht _0  (1.575 m)   Wt 173 lb (78.5 kg)   SpO2 93%   BMI 31.64 kg/m  Wt Readings from Last 3 Encounters:  01/24/21 173 lb (78.5 kg)  11/24/20 165 lb 5.5 oz (75 kg)  10/25/20 165 lb (74.8 kg)    Health Maintenance Due  Topic Date Due   Zoster Vaccines- Shingrix (1 of 2)  Never done   PNA vac Low Risk Adult (2 of 2 - PCV13) 07/11/2016   INFLUENZA VACCINE  12/12/2020    There are no preventive care reminders to display for this patient.   No results found for: TSH Lab Results  Component Value Date   WBC 6.4 10/20/2020   HGB 11.4 10/20/2020   HCT 36.5 10/20/2020   MCV 79 10/20/2020   PLT 412 10/20/2020   Lab Results  Component Value Date   NA 143 10/20/2020   K 4.6 10/20/2020   CO2 25 10/20/2020   GLUCOSE 96 10/20/2020   BUN 12 10/20/2020   CREATININE 0.66 10/20/2020   BILITOT 0.5 10/20/2020   ALKPHOS 87 10/20/2020   AST 16 10/20/2020   ALT 10 10/20/2020   PROT 6.7 10/20/2020   ALBUMIN 4.4 10/20/2020   CALCIUM 10.1 10/20/2020   ANIONGAP 1 (L) 10/09/2020   EGFR 87 10/20/2020   Lab Results  Component Value Date   CHOL 213 (H) 10/20/2020   Lab Results  Component Value Date   HDL 68 10/20/2020   Lab Results  Component Value Date   LDLCALC 131 (H) 10/20/2020   Lab Results  Component Value Date   TRIG 80 10/20/2020   Lab Results  Component Value Date   CHOLHDL 2.2 12/19/2018   No results found for: HGBA1C     Assessment & Plan:   Problem List Items Addressed This Visit  Cardiovascular and Mediastinum   Essential hypertension - Primary    BP Readings from Last 3 Encounters:  01/24/21 (!) 126/59  12/28/20 105/63  11/24/20 (!) 154/79  -no change in meds today      Relevant Orders   CBC with Differential/Platelet   CMP14+EGFR   Lipid Panel With LDL/HDL Ratio     Endocrine   Impaired fasting glucose    -checking A1c with labs      Relevant Orders   Hemoglobin A1c     Musculoskeletal and Integument   Rash    -signs of itching present -no hives to indicate acute allergic reaction, no bites visible -Rx. Triamcinolone cream; pt states she has back scratcher that will reach spots on her back      Relevant Medications   triamcinolone cream (KENALOG) 0.1 %     Other   HLD (hyperlipidemia)    Lab Results   Component Value Date   CHOL 213 (H) 10/20/2020   HDL 68 10/20/2020   LDLCALC 131 (H) 10/20/2020   TRIG 80 10/20/2020   CHOLHDL 2.2 12/19/2018  -no current statin or lipid-lowering agent; no documented statin intolerance/allergy      Relevant Orders   CMP14+EGFR   Lipid Panel With LDL/HDL Ratio     Meds ordered this encounter  Medications   triamcinolone cream (KENALOG) 0.1 %    Sig: Apply 1 application topically 2 (two) times daily.    Dispense:  30 g    Refill:  0     [name], NP

## 2021-01-25 LAB — CBC WITH DIFFERENTIAL/PLATELET
Basophils Absolute: 0.1 10*3/uL (ref 0.0–0.2)
Basos: 1 %
EOS (ABSOLUTE): 0.5 10*3/uL — ABNORMAL HIGH (ref 0.0–0.4)
Eos: 7 %
Hematocrit: 36.2 % (ref 34.0–46.6)
Hemoglobin: 11.7 g/dL (ref 11.1–15.9)
Immature Grans (Abs): 0 10*3/uL (ref 0.0–0.1)
Immature Granulocytes: 0 %
Lymphocytes Absolute: 2.3 10*3/uL (ref 0.7–3.1)
Lymphs: 31 %
MCH: 25.7 pg — ABNORMAL LOW (ref 26.6–33.0)
MCHC: 32.3 g/dL (ref 31.5–35.7)
MCV: 80 fL (ref 79–97)
Monocytes Absolute: 0.9 10*3/uL (ref 0.1–0.9)
Monocytes: 12 %
Neutrophils Absolute: 3.5 10*3/uL (ref 1.4–7.0)
Neutrophils: 49 %
Platelets: 262 10*3/uL (ref 150–450)
RBC: 4.55 x10E6/uL (ref 3.77–5.28)
RDW: 15.3 % (ref 11.7–15.4)
WBC: 7.2 10*3/uL (ref 3.4–10.8)

## 2021-01-25 LAB — LIPID PANEL WITH LDL/HDL RATIO
Cholesterol, Total: 184 mg/dL (ref 100–199)
HDL: 81 mg/dL (ref >39)
LDL Chol Calc (NIH): 92 mg/dL (ref 0–99)
LDL/HDL Ratio: 1.1 ratio (ref 0.0–3.2)
Triglycerides: 57 mg/dL (ref 0–149)
VLDL Cholesterol Cal: 11 mg/dL (ref 5–40)

## 2021-01-25 LAB — CMP14+EGFR
ALT: 7 IU/L (ref 0–32)
AST: 16 IU/L (ref 0–40)
Albumin/Globulin Ratio: 2.4 — ABNORMAL HIGH (ref 1.2–2.2)
Albumin: 4.4 g/dL (ref 3.6–4.6)
Alkaline Phosphatase: 80 IU/L (ref 44–121)
BUN/Creatinine Ratio: 17 (ref 12–28)
BUN: 12 mg/dL (ref 8–27)
Bilirubin Total: 0.6 mg/dL (ref 0.0–1.2)
CO2: 24 mmol/L (ref 20–29)
Calcium: 9.9 mg/dL (ref 8.7–10.3)
Chloride: 104 mmol/L (ref 96–106)
Creatinine, Ser: 0.72 mg/dL (ref 0.57–1.00)
Globulin, Total: 1.8 g/dL (ref 1.5–4.5)
Glucose: 94 mg/dL (ref 65–99)
Potassium: 4.2 mmol/L (ref 3.5–5.2)
Sodium: 144 mmol/L (ref 134–144)
Total Protein: 6.2 g/dL (ref 6.0–8.5)
eGFR: 83 mL/min/{1.73_m2} (ref >59)

## 2021-01-25 LAB — HEMOGLOBIN A1C
Est. average glucose Bld gHb Est-mCnc: 128 mg/dL
Hgb A1c MFr Bld: 6.1 % — ABNORMAL HIGH (ref 4.8–5.6)

## 2021-01-25 NOTE — Progress Notes (Signed)
Labs look great. A1c is controlled at 6.1%

## 2021-02-01 ENCOUNTER — Other Ambulatory Visit: Payer: Self-pay | Admitting: Nurse Practitioner

## 2021-02-03 ENCOUNTER — Telehealth: Payer: Self-pay

## 2021-02-03 NOTE — Telephone Encounter (Signed)
Patient called asked why only sent in 15 pills instead of 30 pills for Tramadol.  Call back # [phone]

## 2021-02-06 NOTE — Telephone Encounter (Signed)
She doesn't use it frequently according to our records.

## 2021-02-20 ENCOUNTER — Ambulatory Visit: Payer: Medicare Other | Admitting: Nurse Practitioner

## 2021-02-27 ENCOUNTER — Telehealth: Payer: Self-pay | Admitting: Nurse Practitioner

## 2021-02-27 DIAGNOSIS — H903 Sensorineural hearing loss, bilateral: Secondary | ICD-10-CM | POA: Diagnosis not present

## 2021-02-27 NOTE — Telephone Encounter (Signed)
T called in about skin cream received . Spots have come back and pt wants another tube of cream (triamcinolone) bigger tube

## 2021-02-28 ENCOUNTER — Other Ambulatory Visit: Payer: Self-pay

## 2021-02-28 ENCOUNTER — Telehealth: Payer: Self-pay

## 2021-02-28 DIAGNOSIS — H905 Unspecified sensorineural hearing loss: Secondary | ICD-10-CM

## 2021-02-28 DIAGNOSIS — R21 Rash and other nonspecific skin eruption: Secondary | ICD-10-CM

## 2021-02-28 MED ORDER — TRIAMCINOLONE ACETONIDE 0.1 % EX CREA: 1.0000 "application " | TOPICAL_CREAM | Freq: Two times a day (BID) | CUTANEOUS | 0 refills | Status: DC

## 2021-02-28 NOTE — Telephone Encounter (Signed)
Refill sent.

## 2021-02-28 NOTE — Telephone Encounter (Signed)
[place] from [facility] called patient showed up for appointment, but needs a referral sent over.Call back # [phone] if any questions.Please fax referral to both fax # [phone] and [phone].

## 2021-02-28 NOTE — Telephone Encounter (Signed)
Referral entered  

## 2021-03-15 ENCOUNTER — Other Ambulatory Visit: Payer: Self-pay | Admitting: Nurse Practitioner

## 2021-03-15 DIAGNOSIS — F419 Anxiety disorder, unspecified: Secondary | ICD-10-CM

## 2021-03-23 ENCOUNTER — Other Ambulatory Visit: Payer: Self-pay

## 2021-03-23 ENCOUNTER — Encounter: Payer: Self-pay | Admitting: Emergency Medicine

## 2021-03-23 ENCOUNTER — Ambulatory Visit
Admission: EM | Admit: 2021-03-23 | Discharge: 2021-03-23 | Disposition: A | Discharge status: Home / Self Care | Payer: Medicare Other | Attending: Family Medicine | Admitting: Family Medicine

## 2021-03-23 DIAGNOSIS — N3090 Cystitis, unspecified without hematuria: Secondary | ICD-10-CM | POA: Diagnosis not present

## 2021-03-23 LAB — POCT URINALYSIS DIP (MANUAL ENTRY)
Bilirubin, UA: NEGATIVE
Glucose, UA: NEGATIVE mg/dL
Ketones, POC UA: NEGATIVE mg/dL
Nitrite, UA: NEGATIVE
Protein Ur, POC: NEGATIVE mg/dL
Spec Grav, UA: <=1.005 — AB (ref 1.010–1.025)
Urobilinogen, UA: 0.2 E.U./dL
pH, UA: 6.5 (ref 5.0–8.0)

## 2021-03-23 MED ORDER — CEPHALEXIN 500 MG PO CAPS: 500.0000 mg | ORAL_CAPSULE | Freq: Two times a day (BID) | ORAL | 0 refills | Status: DC

## 2021-03-23 NOTE — Discharge Instructions (Signed)
You have had labs (urine culture) sent today. We will call you with any significant abnormalities or if there is need to begin or change treatment or pursue further follow up.  You may also review your test results online through MyChart. If you do not have a MyChart account, instructions to sign up should be on your discharge paperwork.  

## 2021-03-23 NOTE — ED Provider Notes (Signed)
[facility]    ASSESSMENT & PLAN:  1. Cystitis    Begin: Meds ordered this encounter  Medications   cephALEXin (KEFLEX) 500 MG capsule    Sig: Take 1 capsule (500 mg total) by mouth 2 (two) times daily.    Dispense:  10 capsule    Refill:  0   No signs of pyelonephritis. Discussed. Urine culture sent. Will follow up with her PCP or here if not showing improvement over the next 48 hours, sooner if needed.    Discharge Instructions      You have had labs (urine culture) sent today. We will call you with any significant abnormalities or if there is need to begin or change treatment or pursue further follow up.  You may also review your test results online through [facility]. If you do not have a MyChart account, instructions to sign up should be on your discharge paperwork.      Outlined signs and symptoms indicating need for more acute intervention. Patient verbalized understanding. After Visit Summary given.  SUBJECTIVE:  [name] is a 85 y.o. female who complains of urinary frequency, urgency and dysuria for the past week; off/on. Without associated flank pain, fever, chills, vaginal discharge or bleeding. Gross hematuria: not present. No specific aggravating or alleviating factors reported. No LE edema. Normal PO intake without n/v/d. Without specific abdominal pain. Ambulatory without difficulty. OTC treatment: none. H/O UTI: occasional with same symptoms. LMP: No LMP recorded. Patient has had a hysterectomy.  OBJECTIVE:  Vitals:   03/23/21 1125  BP: (!) 164/73  Pulse: 79  Resp: 16  Temp: (!) 97.3 F (36.3 C)  TempSrc: Oral  SpO2: 94%   General appearance: alert; no distress Lungs: unlabored respirations Abdomen: benign Skin: warm and dry Neurologic: normal gait Psychological: alert and cooperative; normal mood and affect  Labs Reviewed  POCT URINALYSIS DIP (MANUAL ENTRY) - Abnormal; Notable for the following components:      Result  Value   Spec Grav, UA <=1.005 (*)    Blood, UA trace-lysed (*)    Leukocytes, UA Large (3+) (*)    All other components within normal limits  URINE CULTURE    Allergies  Allergen Reactions   Plavix [Clopidogrel Bisulfate] Itching    Severe itching   Alprazolam Nausea And Vomiting   Codeine Nausea And Vomiting   Percodan [Oxycodone-Aspirin] Nausea And Vomiting   Valium Nausea And Vomiting    Past Medical History:  Diagnosis Date   Acute on chronic respiratory failure with hypoxia (HCC) 04/24/2018   Anxiety    Atypical chest pain    Bronchospasm    CAD (coronary artery disease)    a. s/p DES to mid-LAD and DES to mid-OM1 in 11/2016   Cervical disc disorder with myelopathy, unspecified cervical region    Cervicalgia 01/19/2009   Qualifier: Diagnosis of  By: Aline Brochure MD, [place]     Chest pain at rest 1/75/1025   Chronic systolic (congestive) heart failure (HCC)    COPD (chronic obstructive pulmonary disease) (HCC)    Coronary artery disease    De Quervain's disease (tenosynovitis) 04/02/2011   Disc disease with myelopathy, cervical    Essential hypertension    Hemorrhoids    Liver mass    Lung, cysts, congenital    Left lung cyst   Myocardial infarction ([place])    Nephrolithiasis    Embedded   Nonischemic cardiomyopathy (HCC)    LVEF 35-40% 2015   On home O2  Osteoarthritis    Sprain of wrist 08/07/2012   Thoracic ascending aortic aneurysm    4.3 cm April 2016   Social History   Socioeconomic History   Marital status: Widowed    Spouse name: Not on file   Number of children: Not on file   Years of education: 9th   Highest education level: Not on file  Occupational History    Employer: RETIRED  Tobacco Use   Smoking status: Former    Packs/day: 0.25    Years: 3.00    Pack years: 0.75    Types: Cigarettes    Start date: 05/14/1977    Quit date: 05/28/2008    Years since quitting: 12.8   Smokeless tobacco: Never   Tobacco comments:    patient states she  only smoked for 3 years total  Vaping Use   Vaping Use: Never used  Substance and Sexual Activity   Alcohol use: No    Alcohol/week: 0.0 standard drinks   Drug use: No   Sexual activity: Never  Other Topics Concern   Not on file  Social History Narrative   Not on file   Social Determinants of Health   Financial Resource Strain: Low Risk    Difficulty of Paying Living Expenses: Not very hard  Food Insecurity: No Food Insecurity   Worried About Charity fundraiser in the Last Year: Never true   Ran Out of Food in the Last Year: Never true  Transportation Needs: No Transportation Needs   Lack of Transportation (Medical): No   Lack of Transportation (Non-Medical): No  Physical Activity: Inactive   Days of Exercise per Week: 0 days   Minutes of Exercise per Session: 0 min  Stress: Stress Concern Present   Feeling of Stress : Very much  Social Connections: Socially Isolated   Frequency of Communication with Friends and Family: Three times a week   Frequency of Social Gatherings with Friends and Family: Twice a week   Attends Religious Services: Never   Marine scientist or Organizations: No   Attends Archivist Meetings: Never   Marital Status: Widowed  Human resources officer Violence: Not At Risk   Fear of Current or Ex-Partner: No   Emotionally Abused: No   Physically Abused: No   Sexually Abused: No   Family History  Problem Relation Age of Onset   Heart disease Mother    Aneurysm Father    Lung cancer Brother    Heart disease Sister    Diabetes Brother    Heart disease Brother         Vanessa Kick, MD 03/23/21 1159

## 2021-03-23 NOTE — ED Triage Notes (Signed)
Patient c/o urinary frequency and dysuria x 1 week.   Patient denies fever.   Patient endorses back pain and ABD pain.   Patient hasn't taken any medications for symptoms.

## 2021-03-25 LAB — URINE CULTURE: Culture: 80000 — AB

## 2021-03-28 ENCOUNTER — Telehealth: Payer: Self-pay | Admitting: Nurse Practitioner

## 2021-03-28 NOTE — Telephone Encounter (Signed)
Pt called in requesting refill on antibiotics  Pt states she is not over the current issues (Cystitis)

## 2021-03-30 ENCOUNTER — Other Ambulatory Visit: Payer: Self-pay

## 2021-03-30 ENCOUNTER — Ambulatory Visit
Admission: EM | Admit: 2021-03-30 | Discharge: 2021-03-30 | Disposition: A | Discharge status: Home / Self Care | Payer: Medicare Other | Attending: Emergency Medicine | Admitting: Emergency Medicine

## 2021-03-30 ENCOUNTER — Encounter: Payer: Self-pay | Admitting: Emergency Medicine

## 2021-03-30 DIAGNOSIS — R300 Dysuria: Secondary | ICD-10-CM | POA: Diagnosis not present

## 2021-03-30 LAB — POCT URINALYSIS DIP (MANUAL ENTRY)
Bilirubin, UA: NEGATIVE
Blood, UA: NEGATIVE
Glucose, UA: NEGATIVE mg/dL
Leukocytes, UA: NEGATIVE
Nitrite, UA: NEGATIVE
Protein Ur, POC: NEGATIVE mg/dL
Spec Grav, UA: 1.015 (ref 1.010–1.025)
Urobilinogen, UA: 0.2 E.U./dL
pH, UA: 5.5 (ref 5.0–8.0)

## 2021-03-30 MED ORDER — FLUCONAZOLE 150 MG PO TABS: 150.0000 mg | ORAL_TABLET | Freq: Once | ORAL | 1 refills | Status: AC

## 2021-03-30 MED ORDER — MICONAZOLE NITRATE 2 % EX CREA: 1.0000 "application " | TOPICAL_CREAM | Freq: Two times a day (BID) | CUTANEOUS | 0 refills | Status: DC

## 2021-03-30 NOTE — ED Provider Notes (Signed)
HPI  SUBJECTIVE:  [name] is a 85 y.o. female who presents with low midline abdominal pain/pressure, "a little" dysuria, urgency, frequency, vaginal irritation and itching. She was seen here on 11/10 with urinary symptoms, she was found to have a UTI and sent home with Keflex.  Urine culture grew out E. coli that was pansensitive to everything except ampicillin.  She states that her symptoms got better, but never completely resolved and is concerned that she has a persistent UTI.  No cloudy urine.  States that her urine is intermittently odorous.  No nausea, vomiting, back, pelvic pain.  She reports 1 day of fevers T-max 102, but none since.  She states that she wears pull-ups.  No vaginal discharge, rash, odor.  She is not sexually active.  No antipyretic in the past 6 hours.  She has tried increasing fluids, aspirin and she finished the Keflex without improvement in her symptoms.  No aggravating factors.  Patient has a past medical history of coronary artery disease, CHF, COPD, hypertension, MI, frequent UTIs, pyelonephritis, status post bladder tack.  States that she has been evaluated by urology for her frequent UTIs.  No history of diabetes, BV, yeast, chronic kidney disease.  VZD:GLOV, Trinna Balloon, NP    Past Medical History:  Diagnosis Date   Acute on chronic respiratory failure with hypoxia ([place]) 04/24/2018   Anxiety    Atypical chest pain    Bronchospasm    CAD (coronary artery disease)    a. s/p DES to mid-LAD and DES to mid-OM1 in 11/2016   Cervical disc disorder with myelopathy, unspecified cervical region    Cervicalgia 01/19/2009   Qualifier: Diagnosis of  By: Aline Brochure MD, Stanley     Chest pain at rest 5/64/3329   Chronic systolic (congestive) heart failure (HCC)    COPD (chronic obstructive pulmonary disease) (HCC)    Coronary artery disease    De Quervain's disease (tenosynovitis) 04/02/2011   Disc disease with myelopathy, cervical    Essential hypertension     Hemorrhoids    Liver mass    Lung, cysts, congenital    Left lung cyst   Myocardial infarction ([place])    Nephrolithiasis    Embedded   Nonischemic cardiomyopathy ([place])    LVEF 35-40% 2015   On home O2    Osteoarthritis    Sprain of wrist 08/07/2012   Thoracic ascending aortic aneurysm    4.3 cm April 2016    Past Surgical History:  Procedure Laterality Date   Benign breast tumors     CHOLECYSTECTOMY     COLONOSCOPY     COLONOSCOPY N/A 09/22/2014   Procedure: COLONOSCOPY;  Surgeon: Rogene Houston, MD;  [facility];  Service: Endoscopy;  Laterality: N/A;  830 -- to be done in OR under fluoro   Complete hysterectomy     CORONARY STENT INTERVENTION N/A 11/23/2016   Procedure: Coronary Stent Intervention;  Surgeon: Martinique, Peter M, MD;  Location: [facility];  Service: Cardiovascular;  Laterality: N/A;   LEFT HEART CATH AND CORONARY ANGIOGRAPHY N/A 11/23/2016   Procedure: Left Heart Cath and Coronary Angiography;  Surgeon: Martinique, Peter M, MD;  Location: [facility];  Service: Cardiovascular;  Laterality: N/A;   TONSILLECTOMY AND ADENOIDECTOMY      Family History  Problem Relation Age of Onset   Heart disease Mother    Aneurysm Father    Lung cancer Brother    Heart disease Sister    Diabetes  Brother    Heart disease Brother     Social History   Tobacco Use   Smoking status: Former    Packs/day: 0.25    Years: 3.00    Pack years: 0.75    Types: Cigarettes    Start date: 05/14/1977    Quit date: 05/28/2008    Years since quitting: 12.8   Smokeless tobacco: Never   Tobacco comments:    patient states she only smoked for 3 years total  Vaping Use   Vaping Use: Never used  Substance Use Topics   Alcohol use: No    Alcohol/week: 0.0 standard drinks   Drug use: No    No current facility-administered medications for this encounter.  Current Outpatient Medications:    fluconazole (DIFLUCAN) 150 MG tablet, Take 1 tablet (150 mg total) by mouth  once for 1 dose. 1 tab po x 1. May repeat in 72 hours if no improvement, Disp: 2 tablet, Rfl: 1   miconazole (MICOTIN) 2 % cream, Apply 1 application topically 2 (two) times daily., Disp: 28.35 g, Rfl: 0   albuterol (PROVENTIL) (2.5 MG/3ML) 0.083% nebulizer solution, Take 3 mLs (2.5 mg total) by nebulization every 6 (six) hours as needed for wheezing or shortness of breath., Disp: 75 mL, Rfl: 12   albuterol (VENTOLIN HFA) 108 (90 Base) MCG/ACT inhaler, Inhale 2 puffs into the lungs every 4 (four) hours as needed for wheezing or shortness of breath., Disp: 18 g, Rfl: 5   aspirin EC 81 MG tablet, Take 81 mg by mouth every morning. , Disp: , Rfl:    carvedilol (COREG) 12.5 MG tablet, TAKE (1) TABLET BY MOUTH TWICE DAILY., Disp: 180 tablet, Rfl: 3   clorazepate (TRANXENE) 7.5 MG tablet, TAKE 1 TABLET BY MOUTH ONCE A DAY AS NEEDED FOR ANXIETY., Disp: 30 tablet, Rfl: 0   ENTRESTO 24-26 MG, TAKE 1 TABLET BY MOUTH TWICE DAILY., Disp: 60 tablet, Rfl: 6   Fluticasone-Umeclidin-Vilant (TRELEGY ELLIPTA) 100-62.5-25 MCG/INH AEPB, Inhale 1 puff into the lungs daily., Disp: 60 each, Rfl: 11   meclizine (ANTIVERT) 25 MG tablet, Take 25 mg by mouth as needed for dizziness., Disp: , Rfl:    nitroGLYCERIN (NITROSTAT) 0.4 MG SL tablet, Place 1 tablet (0.4 mg total) under the tongue every 5 (five) minutes x 3 doses as needed for chest pain., Disp: 25 tablet, Rfl: 3   OXYGEN, Inhale 2 L into the lungs at bedtime., Disp: , Rfl:    traMADol (ULTRAM) 50 MG tablet, TAKE ONE TABLET BY MOUTH ONCE DAILY AS NEEDED., Disp: 15 tablet, Rfl: 0   triamcinolone cream (KENALOG) 0.1 %, Apply 1 application topically 2 (two) times daily., Disp: 45 g, Rfl: 0  Allergies  Allergen Reactions   Plavix [Clopidogrel Bisulfate] Itching    Severe itching   Alprazolam Nausea And Vomiting   Codeine Nausea And Vomiting   Percodan [Oxycodone-Aspirin] Nausea And Vomiting   Valium Nausea And Vomiting     ROS  As noted in HPI.   Physical  Exam  BP (!) 169/91   Pulse 70   Temp 97.8 F (36.6 C) (Oral)   Resp 16   SpO2 95%   Constitutional: Well developed, well nourished, no acute distress Eyes:  EOMI, conjunctiva normal bilaterally HENT: Normocephalic, atraumatic,mucus membranes moist Respiratory: Normal inspiratory effort Cardiovascular: Normal rate GI: nondistended, mild suprapubic tenderness.  No flank tenderness. Back: No CVAT skin: No rash, skin intact Musculoskeletal: no deformities Neurologic: Alert & oriented x 3, no focal neuro deficits  Psychiatric: Speech and behavior appropriate   ED Course   Medications - No data to display  Orders Placed This Encounter  Procedures   Urine Culture    Standing Status:   Standing    Number of Occurrences:   1    Order Specific Question:   Indication    Answer:   Dysuria   POCT urinalysis dipstick    Standing Status:   Standing    Number of Occurrences:   1    Results for orders placed or performed during the hospital encounter of 03/30/21 (from the past 24 hour(s))  POCT urinalysis dipstick     Status: Abnormal   Collection Time: 03/30/21  3:13 PM  Result Value Ref Range   Color, UA yellow yellow   Clarity, UA clear clear   Glucose, UA negative negative mg/dL   Bilirubin, UA negative negative   Ketones, POC UA small (15) (A) negative mg/dL   Spec Grav, UA 1.015 1.010 - 1.025   Blood, UA negative negative   pH, UA 5.5 5.0 - 8.0   Protein Ur, POC negative negative mg/dL   Urobilinogen, UA 0.2 0.2 or 1.0 E.U./dL   Nitrite, UA Negative Negative   Leukocytes, UA Negative Negative   No results found.  ED Clinical Impression  1. Dysuria      ED Assessment/Plan  Previous labs, records reviewed.  As noted in HPI.  Patient has small ketones.  No glucose, blood, nitrites, esterase.  Patient's urine dip is negative for UTI.  However, she still has symptoms.  She has some suprapubic tenderness, no flank or CVAT.  She reports fevers for 1 day, but none  since.  Her vitals are normal today.  No antipyretic in the past 6 hours do not think she has pyelonephritis at this time.  Sending urine off for culture-if positive for UTI, will call in the appropriate antibiotics.  In the meantime, she is reporting vaginal itching and irritation.  Suspect yeast vaginitis from recent antibiotic use or irritation from wearing pull-ups.  Will send home with Diflucan, external miconazole.  Strict ER return precautions given.  Follow-up with PMD as needed.  Consider following up with urology  Discussed labs, MDM, treatment plan, and plan for follow-up with patient. Discussed sn/sx that should prompt return to the ED. patient agrees with plan.   Meds ordered this encounter  Medications   miconazole (MICOTIN) 2 % cream    Sig: Apply 1 application topically 2 (two) times daily.    Dispense:  28.35 g    Refill:  0   fluconazole (DIFLUCAN) 150 MG tablet    Sig: Take 1 tablet (150 mg total) by mouth once for 1 dose. 1 tab po x 1. May repeat in 72 hours if no improvement    Dispense:  2 tablet    Refill:  1      *This clinic note was created using [employer]. Therefore, there may be occasional mistakes despite careful proofreading.  ?    Melynda Ripple, MD 03/31/21 [phone]

## 2021-03-30 NOTE — ED Triage Notes (Signed)
PT was seen 11/10 for urinary frequency. Took 5 days of keflex. Symptoms returned after antibiotic completion. Reports frequent UTIs

## 2021-03-30 NOTE — Discharge Instructions (Addendum)
Your urine dip was negative for urinary tract infection today, but I am going to send off your urine to make sure that you do not have a continued infection.  We will contact you can call in the appropriate antibiotics if you still have a UTI.  In the meantime, continue drinking plenty of extra fluids.  I would stop wearing pull-ups and start wearing cotton underwear. you can try the Diflucan and external miconazole for the vaginal irritation.  I suspect you have a yeast infection from the recent antibiotics.

## 2021-04-01 LAB — URINE CULTURE

## 2021-04-14 DIAGNOSIS — Z20822 Contact with and (suspected) exposure to covid-19: Secondary | ICD-10-CM | POA: Diagnosis not present

## 2021-04-15 ENCOUNTER — Other Ambulatory Visit: Payer: Self-pay | Admitting: Internal Medicine

## 2021-04-15 DIAGNOSIS — F419 Anxiety disorder, unspecified: Secondary | ICD-10-CM

## 2021-04-17 NOTE — Progress Notes (Signed)
Cardiology Office Note    Date:  04/19/2021   ID:  [name], DOB 04-11-1936, MRN [id number]  PCP:  [name], NP  Cardiologist: Kate Sable, MD (Inactive)      History of Present Illness:    [name] is a 85 y.o. female with past medical history of CAD (s/p DES to mid-LAD and DES to mid-OM1 in 07/5463), chronic systolic CHF/ICM (EF 68-12% in 2018, at 45% by repeat echo in 12/2018), HTN, HLD, RA, dilation of ascending aorta (at 4.2 cm by imaging in 2019) and COPD who presents to the office today for F/U New to me previously seen by Dr Bronson Ing  In talking with the patient today, she reports having dyspnea on exertion at baseline but denies any acute changes in this. She continues to live by herself but has help with the yard work and chores.   Still living alone but should likely be in assisted living Has vertigo and wants more meclizine Has son in [place] and daughter nearby that check on her    Past Medical History:  Diagnosis Date   Acute on chronic respiratory failure with hypoxia ([place]) 04/24/2018   Anxiety    Atypical chest pain    Bronchospasm    CAD (coronary artery disease)    a. s/p DES to mid-LAD and DES to mid-OM1 in 11/2016   Cervical disc disorder with myelopathy, unspecified cervical region    Cervicalgia 01/19/2009   Qualifier: Diagnosis of  By: Aline Brochure MD, [place]     Chest pain at rest 7/51/7001   Chronic systolic (congestive) heart failure (HCC)    COPD (chronic obstructive pulmonary disease) (HCC)    Coronary artery disease    De Quervain's disease (tenosynovitis) 04/02/2011   Disc disease with myelopathy, cervical    Essential hypertension    Hemorrhoids    Liver mass    Lung, cysts, congenital    Left lung cyst   Myocardial infarction ([place])    Nephrolithiasis    Embedded   Nonischemic cardiomyopathy ([place])    LVEF 35-40% 2015   On home O2    Osteoarthritis    Sprain of wrist 08/07/2012   Thoracic ascending aortic aneurysm     4.3 cm April 2016    Past Surgical History:  Procedure Laterality Date   Benign breast tumors     CHOLECYSTECTOMY     COLONOSCOPY     COLONOSCOPY N/A 09/22/2014   Procedure: COLONOSCOPY;  Surgeon: Rogene Houston, MD;  [facility];  Service: Endoscopy;  Laterality: N/A;  830 -- to be done in OR under fluoro   Complete hysterectomy     CORONARY STENT INTERVENTION N/A 11/23/2016   Procedure: Coronary Stent Intervention;  Surgeon: Martinique, Ayush Boulet M, MD;  Location: [facility];  Service: Cardiovascular;  Laterality: N/A;   LEFT HEART CATH AND CORONARY ANGIOGRAPHY N/A 11/23/2016   Procedure: Left Heart Cath and Coronary Angiography;  Surgeon: Martinique, Noela Brothers M, MD;  Location: [facility];  Service: Cardiovascular;  Laterality: N/A;   TONSILLECTOMY AND ADENOIDECTOMY      Current Medications: Outpatient Medications Prior to Visit  Medication Sig Dispense Refill   albuterol (PROVENTIL) (2.5 MG/3ML) 0.083% nebulizer solution Take 3 mLs (2.5 mg total) by nebulization every 6 (six) hours as needed for wheezing or shortness of breath. 75 mL 12   albuterol (VENTOLIN HFA) 108 (90 Base) MCG/ACT inhaler Inhale 2 puffs into the lungs every 4 (four) hours as  needed for wheezing or shortness of breath. 18 g 5   aspirin EC 81 MG tablet Take 81 mg by mouth every morning.      carvedilol (COREG) 12.5 MG tablet TAKE (1) TABLET BY MOUTH TWICE DAILY. 180 tablet 3   clorazepate (TRANXENE) 7.5 MG tablet TAKE 1 TABLET BY MOUTH ONCE A DAY AS NEEDED FOR ANXIETY. 30 tablet 0   ENTRESTO 24-26 MG TAKE 1 TABLET BY MOUTH TWICE DAILY. 60 tablet 6   Fluticasone-Umeclidin-Vilant (TRELEGY ELLIPTA) 100-62.5-25 MCG/INH AEPB Inhale 1 puff into the lungs daily. 60 each 11   meclizine (ANTIVERT) 25 MG tablet Take 25 mg by mouth as needed for dizziness.     miconazole (MICOTIN) 2 % cream Apply 1 application topically 2 (two) times daily. 28.35 g 0   nitroGLYCERIN (NITROSTAT) 0.4 MG SL tablet Place 1 tablet (0.4  mg total) under the tongue every 5 (five) minutes x 3 doses as needed for chest pain. 25 tablet 3   OXYGEN Inhale 2 L into the lungs at bedtime.     traMADol (ULTRAM) 50 MG tablet TAKE ONE TABLET BY MOUTH ONCE DAILY AS NEEDED. 15 tablet 0   triamcinolone cream (KENALOG) 0.1 % Apply 1 application topically 2 (two) times daily. 45 g 0   No facility-administered medications prior to visit.     Allergies:   Plavix [clopidogrel bisulfate], Alprazolam, Codeine, Percodan [oxycodone-aspirin], and Valium   Social History   Socioeconomic History   Marital status: Widowed    Spouse name: Not on file   Number of children: Not on file   Years of education: 9th   Highest education level: Not on file  Occupational History    Employer: RETIRED  Tobacco Use   Smoking status: Former    Packs/day: 0.25    Years: 3.00    Pack years: 0.75    Types: Cigarettes    Start date: 05/14/1977    Quit date: 05/28/2008    Years since quitting: 12.9   Smokeless tobacco: Never   Tobacco comments:    patient states she only smoked for 3 years total  Vaping Use   Vaping Use: Never used  Substance and Sexual Activity   Alcohol use: No    Alcohol/week: 0.0 standard drinks   Drug use: No   Sexual activity: Never  Other Topics Concern   Not on file  Social History Narrative   Not on file   Social Determinants of Health   Financial Resource Strain: Low Risk    Difficulty of Paying Living Expenses: Not very hard  Food Insecurity: No Food Insecurity   Worried About Charity fundraiser in the Last Year: Never true   Ran Out of Food in the Last Year: Never true  Transportation Needs: No Transportation Needs   Lack of Transportation (Medical): No   Lack of Transportation (Non-Medical): No  Physical Activity: Inactive   Days of Exercise per Week: 0 days   Minutes of Exercise per Session: 0 min  Stress: Stress Concern Present   Feeling of Stress : Very much  Social Connections: Socially Isolated    Frequency of Communication with Friends and Family: Three times a week   Frequency of Social Gatherings with Friends and Family: Twice a week   Attends Religious Services: Never   Marine scientist or Organizations: No   Attends Archivist Meetings: Never   Marital Status: Widowed     Family History:  The patient's family history includes Aneurysm  in her father; Diabetes in her brother; Heart disease in her brother, mother, and sister; Lung cancer in her brother.   Review of Systems:   Please see the history of present illness.     General:  No chills, fever, night sweats or weight changes.  Cardiovascular:  No chest pain, edema, orthopnea, palpitations, paroxysmal nocturnal dyspnea. Positive for dyspnea on exertion.  Dermatological: No rash, lesions/masses Respiratory: No cough, dyspnea Urologic: No hematuria, dysuria Abdominal:   No nausea, vomiting, diarrhea, bright red blood per rectum, melena, or hematemesis Neurologic:  No visual changes, wkns, changes in mental status. All other systems reviewed and are otherwise negative except as noted above.   Physical Exam:    VS:  BP 140/80   Pulse 78   Ht 5\' 2"  (1.575 m)   Wt 177 lb 9.6 oz (80.6 kg)   SpO2 97%   BMI 32.48 kg/m     Affect appropriate Healthy:  appears stated age 82: normal Neck supple with no adenopathy JVP normal no bruits no thyromegaly Lungs clear with no wheezing and good diaphragmatic motion Heart:  S1/S2 no murmur, no rub, gallop or click PMI normal Abdomen: benighn, BS positve, no tenderness, no AAA no bruit.  No HSM or HJR Distal pulses intact with no bruits No edema Neuro non-focal Skin warm and dry No muscular weakness   Wt Readings from Last 3 Encounters:  04/19/21 177 lb 9.6 oz (80.6 kg)  01/24/21 173 lb (78.5 kg)  11/24/20 165 lb 5.5 oz (75 kg)     Studies/Labs Reviewed:   EKG:  EKG is not ordered today. EKG from 12/10/2019 is reviewed and shows NSR, HR 68 with known  LBBB.   Recent Labs: 01/24/2021: ALT 7; BUN 12; Creatinine, Ser 0.72; Hemoglobin 11.7; Platelets 262; Potassium 4.2; Sodium 144   Lipid Panel    Component Value Date/Time   CHOL 184 01/24/2021 1003   TRIG 57 01/24/2021 1003   HDL 81 01/24/2021 1003   CHOLHDL 2.2 12/19/2018 0528   VLDL 11 12/19/2018 0528   LDLCALC 92 01/24/2021 1003    Additional studies/ records that were reviewed today include:   Cardiac Catheterization: 85/6314 LV end diastolic pressure is normal. Mid Cx lesion, 30 %stenosed. Prox RCA lesion, 20 %stenosed. Prox LAD to Mid LAD lesion, 85 %stenosed. A STENT PROMUS PREM MR 3.5X32 drug eluting stent was successfully placed. Post intervention, there is a 0% residual stenosis. 1st Mrg lesion, 90 %stenosed. A STENT PROMUS PREM MR 2.5X16 drug eluting stent was successfully placed. Post intervention, there is a 0% residual stenosis.   1. 2 vessel obstructive CAD    - 85% segmental mid LAD    - 90% mid OM1.  2. Normal LVEDP 3. Successful stenting of the mid LAD with DES 4. Successful stenting of the mid OM1 with DES   Plan: DAPT for one year. Anticipate DC in am.    Echocardiogram: 12/2018 IMPRESSIONS     1. The left ventricle has a visually estimated ejection fraction of 45%.  The cavity size was normal. There is moderate concentric left ventricular  hypertrophy. Left ventricular diastolic Doppler parameters are consistent  with impaired relaxation.  Elevated left ventricular end-diastolic pressure Left ventricular diffuse  hypokinesis.   2. The right ventricle has normal systolic function. The cavity was  normal. There is no increase in right ventricular wall thickness.   3. The mitral valve is grossly normal. Mild thickening of the mitral  valve leaflet.   4. The  tricuspid valve is grossly normal.   5. The aortic valve was not well visualized. Aortic valve regurgitation  is trivial by color flow Doppler.   6. The aorta is normal in size and  structure.   7. The inferior vena cava was dilated in size with >50% respiratory  variability.    Plan:   In order of problems listed above:  1. CAD - She is s/p DES to mid-LAD and DES to mid-OM1 in 11/2016. She has dyspnea on exertion at baseline but denies any acute changes in this.  - Continue ASA 81mg  daily, Atorvastatin 80mg  daily and Coreg 12.5mg  BID. Updated Rx for SL NTG provided.   2. Chronic Combined Systolic and Diastolic CHF - Her EF had improved to 45% by most repeat echo in 12/2018.   - update echo for ischemic DCM - Continue entresto, ASA coreg and entresto  3. HTN - BP is well-controlled    4. HLD - She remains on Atorvastatin 80mg  daily with goal LDL less than 70 with known CAD.   5. Thoracic Aortic Aneurysm - Was improved to 4.2 cm by imaging in 2019.given age will not re image unless TTE shows gross aortic enlargement as she is not a surgical candidate    TTE for ischemic DCM  F/U in a year       Signed, Jenkins Rouge, MD  04/19/2021 10:16 AM    [facility]. [address] [place], [place] [address] Phone: [phone] Fax: [phone]

## 2021-04-19 ENCOUNTER — Ambulatory Visit (INDEPENDENT_AMBULATORY_CARE_PROVIDER_SITE_OTHER): Payer: Medicare Other | Admitting: Cardiovascular Disease

## 2021-04-19 ENCOUNTER — Encounter: Payer: Self-pay | Admitting: Cardiovascular Disease

## 2021-04-19 ENCOUNTER — Other Ambulatory Visit: Payer: Self-pay

## 2021-04-19 VITALS — BP 140/80 | HR 78 | Ht 62.0 in | Wt 177.6 lb

## 2021-04-19 DIAGNOSIS — I639 Cerebral infarction, unspecified: Secondary | ICD-10-CM

## 2021-04-19 DIAGNOSIS — I429 Cardiomyopathy, unspecified: Secondary | ICD-10-CM

## 2021-04-19 DIAGNOSIS — I7121 Aneurysm of the ascending aorta, without rupture: Secondary | ICD-10-CM

## 2021-04-19 DIAGNOSIS — I5042 Chronic combined systolic (congestive) and diastolic (congestive) heart failure: Secondary | ICD-10-CM | POA: Diagnosis not present

## 2021-04-19 MED ORDER — MECLIZINE HCL 25 MG PO TABS: 25.0000 mg | ORAL_TABLET | ORAL | 11 refills | Status: DC | PRN

## 2021-04-19 NOTE — Patient Instructions (Signed)
Medication Instructions:  Your physician recommends that you continue on your current medications as directed. Please refer to the Current Medication list given to you today.  *If you need a refill on your cardiac medications before your next appointment, please call your pharmacy*   Lab Work: NONE   If you have labs (blood work) drawn today and your tests are completely normal, you will receive your results only by: [place] (if you have MyChart) OR A paper copy in the mail If you have any lab test that is abnormal or we need to change your treatment, we will call you to review the results.   Testing/Procedures: Your physician has requested that you have an echocardiogram. Echocardiography is a painless test that uses sound waves to create images of your heart. It provides your doctor with information about the size and shape of your heart and how well your heart's chambers and valves are working. This procedure takes approximately one hour. There are no restrictions for this procedure.    Follow-Up: At [facility], you and your health needs are our priority.  As part of our continuing mission to provide you with exceptional heart care, we have created designated Provider Care Teams.  These Care Teams include your primary Cardiologist (physician) and Advanced Practice Providers (APPs -  Physician Assistants and Nurse Practitioners) who all work together to provide you with the care you need, when you need it.  We recommend signing up for the patient portal called "MyChart".  Sign up information is provided on this After Visit Summary.  MyChart is used to connect with patients for Virtual Visits (Telemedicine).  Patients are able to view lab/test results, encounter notes, upcoming appointments, etc.  Non-urgent messages can be sent to your provider as well.   To learn more about what you can do with MyChart, go to [email or web address].    Your next appointment:   1  year(s)  The format for your next appointment:   In Person  Provider:   Jenkins Rouge, MD    Other Instructions Thank you for choosing [facility]!

## 2021-04-24 ENCOUNTER — Encounter: Payer: Self-pay | Admitting: Obstetrics & Gynecology

## 2021-04-24 ENCOUNTER — Ambulatory Visit (INDEPENDENT_AMBULATORY_CARE_PROVIDER_SITE_OTHER): Payer: Medicare Other | Admitting: Obstetrics & Gynecology

## 2021-04-24 ENCOUNTER — Other Ambulatory Visit: Payer: Self-pay

## 2021-04-24 VITALS — BP 117/59 | HR 72 | Ht 62.0 in | Wt 178.0 lb

## 2021-04-24 DIAGNOSIS — N3946 Mixed incontinence: Secondary | ICD-10-CM | POA: Diagnosis not present

## 2021-04-24 DIAGNOSIS — R350 Frequency of micturition: Secondary | ICD-10-CM | POA: Diagnosis not present

## 2021-04-24 DIAGNOSIS — N8110 Cystocele, unspecified: Secondary | ICD-10-CM

## 2021-04-24 LAB — POCT URINALYSIS DIPSTICK
Glucose, UA: NEGATIVE
Ketones, UA: NEGATIVE
Nitrite, UA: NEGATIVE
Protein, UA: NEGATIVE

## 2021-04-24 NOTE — Progress Notes (Signed)
GYN VISIT Patient name: [name] MRN [id number]  Date of birth: [birth year] Chief Complaint:   thinks bladder has dropped (Having trouble holding urine)  History of Present Illness:   [name] is a 85 y.o. G3P3002 PM, [place] female being seen today for the following concerns:  Urinary issues: She notes pelvic pressure.  Almost as though something is there that shouldn't be.  She also notes urinary incontinence that has been going on for the past yr. States the pee just comes out.  Notes both urge and stress incontinence.  She herself, voids at least 4-5x per day.  Voids 2-3x per night.  It is unclear if she has incomplete voids. Notes h/o recurrent UTIs  No LMP recorded. Patient has had a hysterectomy.  Depression screen [facility] 2/9 01/24/2021 01/06/2021 01/06/2021 10/20/2020 07/20/2020  Decreased Interest 0 2 0 0 2  Down, Depressed, Hopeless 1 2 0 1 3  PHQ - 2 Score 1 4 0 1 5  Altered sleeping 1 2 - - 1  Tired, decreased energy 2 2 - - 2  Change in appetite 1 0 - - 0  Feeling bad or failure about yourself  0 0 - - 0  Trouble concentrating 3 0 - - 0  Moving slowly or fidgety/restless 0 0 - - 0  Suicidal thoughts 0 0 - - 0  PHQ-9 Score 8 8 - - 8  Some recent data might be hidden     Review of Systems:   Pertinent items are noted in HPI Notes occasional headaches and dizziness.  Notes occasional diarrhea. Pertinent History Reviewed:  Reviewed past medical,surgical, social, obstetrical and family history.  Reviewed problem list, medications and allergies. Physical Assessment:   Vitals:   04/24/21 1456  BP: (!) 117/59  Pulse: 72  Weight: 178 lb (80.7 kg)  Height: 5\' 2"  (1.575 m)  Body mass index is 32.56 kg/m.       Physical Examination:   General appearance: alert, well appearing, and in no distress  Psych: mood appropriate, normal affect  Skin: warm & dry   Cardiovascular: normal heart rate noted  Respiratory: normal respiratory effort, no distress  Abdomen: obese,  soft, non-tender   Pelvic: VULVA: normal appearing vulva with no masses, tenderness or lesions, VAGINA: normal appearing vagina with normal color and discharge, no lesions noted.  On speculum exam good apical support appreciated, stage 2 cystocele appreciated, no rectocele noted  Extremities: no edema   Straight Catheterization Procedure for PVR: After verbal consent was obtained from the patient for catheterization to assess bladder emptying and residual volume the urethra and surrounding tissues were prepped with betadine and an in and out catheterization was performed.  PVR was 12mL.  Urine appeared clear yellow. The patient tolerated the procedure well.   Chaperone:  Dr. Nancy Fetter     Assessment & Plan:  1) Stage 1 cystocele -discussed management options including pessary- pt declined -pt was most concerned that there was "something in there that shouldn't." Reassured pt that the only finding was cystocele. -PVR appropriate  2) Mixed incontinence -plan to r/o underlying infection -Reviewed conservative therapy and discussed medical management -pt was hesitant for adding more medication  -Declined management at this time, advised to RTC should she ever desires to re-evaluate/treat   Orders Placed This Encounter  Procedures   Urine Culture   Urinalysis, Routine w reflex microscopic   POCT Urinalysis Dipstick    Return for TBD.   Janyth Pupa, DO Attending Obstetrician &  Gynecologist, Product/process development scientist for [employer], [place]

## 2021-04-25 ENCOUNTER — Ambulatory Visit: Payer: Medicare Other | Admitting: Nurse Practitioner

## 2021-04-25 LAB — URINALYSIS, ROUTINE W REFLEX MICROSCOPIC
Bilirubin, UA: NEGATIVE
Glucose, UA: NEGATIVE
Ketones, UA: NEGATIVE
Nitrite, UA: NEGATIVE
Protein,UA: NEGATIVE
RBC, UA: NEGATIVE
Specific Gravity, UA: 1.011 (ref 1.005–1.030)
Urobilinogen, Ur: 0.2 mg/dL (ref 0.2–1.0)
pH, UA: 6 (ref 5.0–7.5)

## 2021-04-25 LAB — MICROSCOPIC EXAMINATION
Casts: NONE SEEN /lpf
Epithelial Cells (non renal): >10 /hpf — AB (ref 0–10)
RBC, Urine: NONE SEEN /hpf (ref 0–2)

## 2021-04-26 ENCOUNTER — Telehealth: Payer: Self-pay | Admitting: *Deleted

## 2021-04-26 NOTE — Telephone Encounter (Signed)
Pt aware urine culture is not back yet. Can take 2-3 days. Pt voiced understanding. [place]

## 2021-05-01 ENCOUNTER — Other Ambulatory Visit: Payer: Self-pay | Admitting: Obstetrics & Gynecology

## 2021-05-01 ENCOUNTER — Telehealth: Payer: Self-pay | Admitting: Obstetrics & Gynecology

## 2021-05-01 DIAGNOSIS — N3000 Acute cystitis without hematuria: Secondary | ICD-10-CM

## 2021-05-01 MED ORDER — NITROFURANTOIN MONOHYD MACRO 100 MG PO CAPS: 100.0000 mg | ORAL_CAPSULE | Freq: Two times a day (BID) | ORAL | 0 refills | Status: AC

## 2021-05-01 NOTE — Progress Notes (Signed)
Findings suggestive of UTI, prescription sent in

## 2021-05-01 NOTE — Telephone Encounter (Signed)
Patient called concerning her labs. [name] said that we're still waiting on some labs to come in. I let Ms. Jacob know that as soon as they all came in, Dr Nelda Marseille would call her.

## 2021-05-02 LAB — URINE CULTURE

## 2021-05-26 ENCOUNTER — Ambulatory Visit (HOSPITAL_COMMUNITY)
Admission: RE | Admit: 2021-05-26 | Discharge: 2021-05-26 | Disposition: A | Discharge status: Home / Self Care | Payer: Medicare Other | Source: Ambulatory Visit | Attending: Cardiovascular Disease | Admitting: Cardiovascular Disease

## 2021-05-26 ENCOUNTER — Other Ambulatory Visit: Payer: Self-pay

## 2021-05-26 DIAGNOSIS — I429 Cardiomyopathy, unspecified: Secondary | ICD-10-CM | POA: Insufficient documentation

## 2021-05-26 DIAGNOSIS — I5042 Chronic combined systolic (congestive) and diastolic (congestive) heart failure: Secondary | ICD-10-CM | POA: Diagnosis not present

## 2021-05-26 DIAGNOSIS — I428 Other cardiomyopathies: Secondary | ICD-10-CM

## 2021-05-26 LAB — ECHOCARDIOGRAM COMPLETE
AR max vel: 2.04 cm2
AV Area VTI: 2.08 cm2
AV Area mean vel: 1.94 cm2
AV Mean grad: 5 mmHg
AV Peak grad: 10.6 mmHg
Ao pk vel: 1.63 m/s
Area-P 1/2: 3.37 cm2
Calc EF: 43.5 %
MV VTI: 2.75 cm2
S' Lateral: 4.2 cm
Single Plane A2C EF: 51.8 %
Single Plane A4C EF: 35.7 %

## 2021-05-26 NOTE — Progress Notes (Signed)
*  PRELIMINARY RESULTS* Echocardiogram 2D Echocardiogram has been performed.  [name] 05/26/2021, 11:18 AM

## 2021-05-30 ENCOUNTER — Telehealth: Payer: Self-pay

## 2021-05-30 MED ORDER — SPIRONOLACTONE 25 MG PO TABS: ORAL_TABLET | ORAL | 3 refills | Status: DC

## 2021-05-30 NOTE — Telephone Encounter (Signed)
I discussed echo results with patient.She will take aldactone 25 mg prn for SOB,edema or weight gain over 3 lbs over night.E-scribed to [employer]

## 2021-05-30 NOTE — Telephone Encounter (Signed)
-----   Message from Josue Hector, MD sent at 05/26/2021 12:00 PM EST ----- EF remains moderately reduced chronic pericardial effuison Call in aldactone 25 mg to take PRN dyspnea edema or more than 3 lb weight gain

## 2021-06-21 ENCOUNTER — Other Ambulatory Visit: Payer: Self-pay | Admitting: Internal Medicine

## 2021-06-21 DIAGNOSIS — F419 Anxiety disorder, unspecified: Secondary | ICD-10-CM

## 2021-06-26 DIAGNOSIS — Z20822 Contact with and (suspected) exposure to covid-19: Secondary | ICD-10-CM | POA: Diagnosis not present

## 2021-07-13 ENCOUNTER — Observation Stay (HOSPITAL_COMMUNITY)
Admission: EM | Admit: 2021-07-13 | Discharge: 2021-07-14 | Disposition: A | Discharge status: Home Health | Payer: Medicare Other | Attending: Internal Medicine | Admitting: Internal Medicine

## 2021-07-13 ENCOUNTER — Emergency Department (HOSPITAL_COMMUNITY): Payer: Medicare Other

## 2021-07-13 ENCOUNTER — Other Ambulatory Visit: Payer: Self-pay

## 2021-07-13 ENCOUNTER — Encounter (HOSPITAL_COMMUNITY): Payer: Self-pay

## 2021-07-13 DIAGNOSIS — K219 Gastro-esophageal reflux disease without esophagitis: Secondary | ICD-10-CM | POA: Diagnosis not present

## 2021-07-13 DIAGNOSIS — Z20822 Contact with and (suspected) exposure to covid-19: Secondary | ICD-10-CM | POA: Diagnosis not present

## 2021-07-13 DIAGNOSIS — I2510 Atherosclerotic heart disease of native coronary artery without angina pectoris: Principal | ICD-10-CM | POA: Insufficient documentation

## 2021-07-13 DIAGNOSIS — Z7982 Long term (current) use of aspirin: Secondary | ICD-10-CM | POA: Diagnosis not present

## 2021-07-13 DIAGNOSIS — I447 Left bundle-branch block, unspecified: Secondary | ICD-10-CM

## 2021-07-13 DIAGNOSIS — E785 Hyperlipidemia, unspecified: Secondary | ICD-10-CM | POA: Diagnosis not present

## 2021-07-13 DIAGNOSIS — R079 Chest pain, unspecified: Secondary | ICD-10-CM

## 2021-07-13 DIAGNOSIS — I10 Essential (primary) hypertension: Secondary | ICD-10-CM | POA: Diagnosis not present

## 2021-07-13 DIAGNOSIS — Z955 Presence of coronary angioplasty implant and graft: Secondary | ICD-10-CM | POA: Insufficient documentation

## 2021-07-13 DIAGNOSIS — I959 Hypotension, unspecified: Secondary | ICD-10-CM | POA: Diagnosis not present

## 2021-07-13 DIAGNOSIS — Z8249 Family history of ischemic heart disease and other diseases of the circulatory system: Secondary | ICD-10-CM | POA: Diagnosis not present

## 2021-07-13 DIAGNOSIS — J432 Centrilobular emphysema: Secondary | ICD-10-CM | POA: Diagnosis not present

## 2021-07-13 DIAGNOSIS — R059 Cough, unspecified: Secondary | ICD-10-CM | POA: Diagnosis not present

## 2021-07-13 DIAGNOSIS — I517 Cardiomegaly: Secondary | ICD-10-CM | POA: Diagnosis not present

## 2021-07-13 DIAGNOSIS — R001 Bradycardia, unspecified: Secondary | ICD-10-CM | POA: Diagnosis not present

## 2021-07-13 DIAGNOSIS — F419 Anxiety disorder, unspecified: Secondary | ICD-10-CM | POA: Diagnosis not present

## 2021-07-13 DIAGNOSIS — I110 Hypertensive heart disease with heart failure: Secondary | ICD-10-CM | POA: Insufficient documentation

## 2021-07-13 DIAGNOSIS — Z87891 Personal history of nicotine dependence: Secondary | ICD-10-CM | POA: Insufficient documentation

## 2021-07-13 DIAGNOSIS — J449 Chronic obstructive pulmonary disease, unspecified: Secondary | ICD-10-CM | POA: Insufficient documentation

## 2021-07-13 DIAGNOSIS — R0789 Other chest pain: Secondary | ICD-10-CM | POA: Diagnosis not present

## 2021-07-13 DIAGNOSIS — I5042 Chronic combined systolic (congestive) and diastolic (congestive) heart failure: Secondary | ICD-10-CM | POA: Insufficient documentation

## 2021-07-13 DIAGNOSIS — I25119 Atherosclerotic heart disease of native coronary artery with unspecified angina pectoris: Secondary | ICD-10-CM

## 2021-07-13 DIAGNOSIS — E782 Mixed hyperlipidemia: Secondary | ICD-10-CM | POA: Diagnosis present

## 2021-07-13 DIAGNOSIS — Z79899 Other long term (current) drug therapy: Secondary | ICD-10-CM | POA: Diagnosis not present

## 2021-07-13 LAB — PROTIME-INR
INR: 1 (ref 0.8–1.2)
Prothrombin Time: 13 seconds (ref 11.4–15.2)

## 2021-07-13 LAB — CBC
HCT: 34.1 % — ABNORMAL LOW (ref 36.0–46.0)
Hemoglobin: 10.8 g/dL — ABNORMAL LOW (ref 12.0–15.0)
MCH: 26.3 pg (ref 26.0–34.0)
MCHC: 31.7 g/dL (ref 30.0–36.0)
MCV: 83 fL (ref 80.0–100.0)
Platelets: 306 10*3/uL (ref 150–400)
RBC: 4.11 MIL/uL (ref 3.87–5.11)
RDW: 15.7 % — ABNORMAL HIGH (ref 11.5–15.5)
WBC: 7.7 10*3/uL (ref 4.0–10.5)
nRBC: 0 % (ref 0.0–0.2)

## 2021-07-13 LAB — LIPID PANEL
Cholesterol: 216 mg/dL — ABNORMAL HIGH (ref 0–200)
HDL: 71 mg/dL (ref >40)
LDL Cholesterol: 133 mg/dL — ABNORMAL HIGH (ref 0–99)
Total CHOL/HDL Ratio: 3 RATIO
Triglycerides: 62 mg/dL (ref <150)
VLDL: 12 mg/dL (ref 0–40)

## 2021-07-13 LAB — BASIC METABOLIC PANEL
Anion gap: 7 (ref 5–15)
BUN: 14 mg/dL (ref 8–23)
CO2: 26 mmol/L (ref 22–32)
Calcium: 9.3 mg/dL (ref 8.9–10.3)
Chloride: 103 mmol/L (ref 98–111)
Creatinine, Ser: 0.71 mg/dL (ref 0.44–1.00)
GFR, Estimated: >60 mL/min (ref >60)
Glucose, Bld: 94 mg/dL (ref 70–99)
Potassium: 4.1 mmol/L (ref 3.5–5.1)
Sodium: 136 mmol/L (ref 135–145)

## 2021-07-13 LAB — PHOSPHORUS: Phosphorus: 3.6 mg/dL (ref 2.5–4.6)

## 2021-07-13 LAB — RESP PANEL BY RT-PCR (FLU A&B, COVID) ARPGX2
Influenza A by PCR: NEGATIVE
Influenza B by PCR: NEGATIVE
SARS Coronavirus 2 by RT PCR: NEGATIVE

## 2021-07-13 LAB — TROPONIN I (HIGH SENSITIVITY)
Troponin I (High Sensitivity): 8 ng/L (ref <18)
Troponin I (High Sensitivity): 8 ng/L (ref <18)
Troponin I (High Sensitivity): 9 ng/L (ref <18)

## 2021-07-13 LAB — APTT: aPTT: 30 seconds (ref 24–36)

## 2021-07-13 LAB — TSH: TSH: 1.432 u[IU]/mL (ref 0.350–4.500)

## 2021-07-13 LAB — MAGNESIUM: Magnesium: 2.4 mg/dL (ref 1.7–2.4)

## 2021-07-13 LAB — CBG MONITORING, ED: Glucose-Capillary: 97 mg/dL (ref 70–99)

## 2021-07-13 MED ORDER — ACETAMINOPHEN 325 MG PO TABS: 650.0000 mg | ORAL_TABLET | Freq: Four times a day (QID) | ORAL | Status: DC | PRN

## 2021-07-13 MED ORDER — SODIUM CHLORIDE 0.9% FLUSH
3.0000 mL | Freq: Two times a day (BID) | INTRAVENOUS | Status: DC
  Administered 2021-07-13 – 2021-07-14 (×2): 3 mL via INTRAVENOUS

## 2021-07-13 MED ORDER — ONDANSETRON HCL 4 MG/2ML IJ SOLN: 4.0000 mg | Freq: Four times a day (QID) | INTRAMUSCULAR | Status: DC | PRN

## 2021-07-13 MED ORDER — NITROGLYCERIN 0.4 MG SL SUBL: 0.4000 mg | SUBLINGUAL_TABLET | SUBLINGUAL | Status: DC | PRN

## 2021-07-13 MED ORDER — ASPIRIN 81 MG PO CHEW: 324.0000 mg | CHEWABLE_TABLET | Freq: Once | ORAL | Status: DC

## 2021-07-13 MED ORDER — CARVEDILOL 3.125 MG PO TABS
6.2500 mg | ORAL_TABLET | Freq: Two times a day (BID) | ORAL | Status: DC
  Administered 2021-07-13 – 2021-07-14 (×2): 6.25 mg via ORAL
  Filled 2021-07-13 (×2): qty 2

## 2021-07-13 MED ORDER — ATORVASTATIN CALCIUM 40 MG PO TABS
40.0000 mg | ORAL_TABLET | Freq: Every day | ORAL | Status: DC
  Filled 2021-07-13 (×2): qty 1

## 2021-07-13 MED ORDER — CLORAZEPATE DIPOTASSIUM 7.5 MG PO TABS: 3.7500 mg | ORAL_TABLET | Freq: Two times a day (BID) | ORAL | Status: DC | PRN

## 2021-07-13 MED ORDER — FLUTICASONE FUROATE-VILANTEROL 100-25 MCG/ACT IN AEPB
1.0000 | INHALATION_SPRAY | Freq: Every day | RESPIRATORY_TRACT | Status: DC
Start: 2021-07-13 — End: 2021-07-14
  Filled 2021-07-13: qty 28

## 2021-07-13 MED ORDER — HEPARIN SODIUM (PORCINE) 5000 UNIT/ML IJ SOLN
5000.0000 [IU] | Freq: Three times a day (TID) | INTRAMUSCULAR | Status: DC
  Administered 2021-07-13 – 2021-07-14 (×2): 5000 [IU] via SUBCUTANEOUS
  Filled 2021-07-13 (×3): qty 1

## 2021-07-13 MED ORDER — SODIUM CHLORIDE 0.9 % IV SOLN: 250.0000 mL | INTRAVENOUS | Status: DC | PRN

## 2021-07-13 MED ORDER — MORPHINE SULFATE (PF) 2 MG/ML IV SOLN: 1.0000 mg | INTRAVENOUS | Status: DC | PRN

## 2021-07-13 MED ORDER — ALBUTEROL SULFATE (2.5 MG/3ML) 0.083% IN NEBU: 2.5000 mg | INHALATION_SOLUTION | Freq: Four times a day (QID) | RESPIRATORY_TRACT | Status: DC | PRN

## 2021-07-13 MED ORDER — SODIUM CHLORIDE 0.9 % IV SOLN: INTRAVENOUS | Status: DC

## 2021-07-13 MED ORDER — UMECLIDINIUM BROMIDE 62.5 MCG/ACT IN AEPB
1.0000 | INHALATION_SPRAY | Freq: Every day | RESPIRATORY_TRACT | Status: DC
  Filled 2021-07-13: qty 7

## 2021-07-13 MED ORDER — ACETAMINOPHEN 650 MG RE SUPP: 650.0000 mg | Freq: Four times a day (QID) | RECTAL | Status: DC | PRN

## 2021-07-13 MED ORDER — SACUBITRIL-VALSARTAN 24-26 MG PO TABS
1.0000 | ORAL_TABLET | Freq: Two times a day (BID) | ORAL | Status: DC
  Administered 2021-07-13 – 2021-07-14 (×2): 1 via ORAL
  Filled 2021-07-13 (×3): qty 1

## 2021-07-13 MED ORDER — ASPIRIN EC 81 MG PO TBEC
81.0000 mg | DELAYED_RELEASE_TABLET | ORAL | Status: DC
  Administered 2021-07-14: 81 mg via ORAL
  Filled 2021-07-13: qty 1

## 2021-07-13 MED ORDER — SODIUM CHLORIDE 0.9% FLUSH: 3.0000 mL | INTRAVENOUS | Status: DC | PRN

## 2021-07-13 MED ORDER — PANTOPRAZOLE SODIUM 40 MG PO TBEC
40.0000 mg | DELAYED_RELEASE_TABLET | Freq: Every day | ORAL | Status: DC
  Administered 2021-07-13 – 2021-07-14 (×2): 40 mg via ORAL
  Filled 2021-07-13 (×2): qty 1

## 2021-07-13 NOTE — ED Triage Notes (Signed)
Patient states CP since this morning and has had five 81mg  aspirin today. Patient states nausea. Skin warm and dry. 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  ?

## 2021-07-13 NOTE — ED Provider Notes (Signed)
[facility] Provider Note   CSN: [id number] Arrival date & time: 07/13/21  1224     History  Chief Complaint  Patient presents with   Chest Pain    Tishana Clinkenbeard Rue is a 86 y.o. female.   Chest Pain Associated symptoms: shortness of breath   Associated symptoms: no abdominal pain, no back pain, no cough, no fever, no headache, no nausea, no numbness, no vomiting and no weakness    This patient is an 86 year old female, she has a known history of coronary disease status post evaluation by cardiology.  Review of the medical record shows that she had a heart catheterization in July 2018 showing 85% proximal left anterior descending stenosis, a stent was placed in that area.  There was also a 90% first marginal lesion and a stent was placed there as well.  Echocardiogram was performed on May 26, 2021 which showed an ejection fraction of 35 to 40% with grade 1 diastolic dysfunction.  The patient does not have a pacemaker, she lives at home, she states that today she developed some chest discomfort while she was doing usual things such as folding laundry and doing dishes, this is a heaviness in her chest that she feels like someone is behind her squeezing her, it radiates up into the neck and the shoulders.  She feels like she needs to belch but does not.  She has no nausea vomiting or diaphoresis, she feels like she cannot get a deep breath, she has no swelling in her legs.  This is similar to what she has had in the past.  At 1 point today she had a pulse oximeter on her finger and notes that her pulse was in the low 30s and her oxygen had dropped into the 70s, it is not that way at this time on arrival where her heart rate is in the 60s and oxygen is between 90 and 94%  Home Medications Prior to Admission medications   Medication Sig Start Date End Date Taking? Authorizing Provider  albuterol (PROVENTIL) (2.5 MG/3ML) 0.083% nebulizer solution Take 3 mLs (2.5 mg  total) by nebulization every 6 (six) hours as needed for wheezing or shortness of breath. 07/13/15   Sinda Du, MD  albuterol (VENTOLIN HFA) 108 (90 Base) MCG/ACT inhaler Inhale 2 puffs into the lungs every 4 (four) hours as needed for wheezing or shortness of breath. 10/25/20   Chesley Mires, MD  aspirin EC 81 MG tablet Take 81 mg by mouth every morning.     [provider]  carvedilol (COREG) 12.5 MG tablet TAKE (1) TABLET BY MOUTH TWICE DAILY. 10/25/20   Strader, Fransisco Hertz, PA-C  clorazepate (TRANXENE) 7.5 MG tablet TAKE 1 TABLET BY MOUTH ONCE A DAY AS NEEDED FOR ANXIETY. 06/21/21   Patel, Colin Broach, MD  ENTRESTO 24-26 MG TAKE 1 TABLET BY MOUTH TWICE DAILY. 03/30/20   Arnoldo Lenis, MD  Fluticasone-Umeclidin-Vilant (TRELEGY ELLIPTA) 100-62.5-25 MCG/INH AEPB Inhale 1 puff into the lungs daily. 10/25/20   Chesley Mires, MD  meclizine (ANTIVERT) 25 MG tablet Take 1 tablet (25 mg total) by mouth as needed for dizziness. 04/19/21   Josue Hector, MD  miconazole (MICOTIN) 2 % cream Apply 1 application topically 2 (two) times daily. 03/30/21   Melynda Ripple, MD  nitroGLYCERIN (NITROSTAT) 0.4 MG SL tablet Place 1 tablet (0.4 mg total) under the tongue every 5 (five) minutes x 3 doses as needed for chest pain. 07/21/20   [name],  NP  OXYGEN Inhale 2 L into the lungs at bedtime.    [provider]  spironolactone (ALDACTONE) 25 MG tablet Take as needed for shortness of breath,swelling or more than 3 lb weight gain over night 05/30/21   Josue Hector, MD  traMADol (ULTRAM) 50 MG tablet TAKE ONE TABLET BY MOUTH ONCE DAILY AS NEEDED. 06/21/21   Lindell Spar, MD  triamcinolone cream (KENALOG) 0.1 % Apply 1 application topically 2 (two) times daily. 02/28/21   Fayrene Helper, MD      Allergies    Plavix [clopidogrel bisulfate], Alprazolam, Codeine, Percodan [oxycodone-aspirin], and Valium    Review of Systems   Review of Systems  Constitutional:  Negative for chills and  fever.  HENT:  Negative for sore throat.   Eyes:  Negative for visual disturbance.  Respiratory:  Positive for shortness of breath. Negative for cough.   Cardiovascular:  Positive for chest pain.  Gastrointestinal:  Negative for abdominal pain, diarrhea, nausea and vomiting.  Genitourinary:  Negative for dysuria and frequency.  Musculoskeletal:  Negative for back pain and neck pain.  Skin:  Negative for rash.  Neurological:  Negative for weakness, numbness and headaches.  Hematological:  Negative for adenopathy.  Psychiatric/Behavioral:  Negative for behavioral problems.    Physical Exam Updated Vital Signs BP (!) 128/53    Pulse 65    Temp 98.1 F (36.7 C) (Oral)    Resp 14    Ht 1.575 m (5\' 2" )    Wt 82 kg    SpO2 97%    BMI 33.06 kg/m  Physical Exam Vitals and nursing note reviewed.  Constitutional:      General: She is not in acute distress.    Appearance: She is well-developed.  HENT:     Head: Normocephalic and atraumatic.     Mouth/Throat:     Pharynx: No oropharyngeal exudate.  Eyes:     General: No scleral icterus.       Right eye: No discharge.        Left eye: No discharge.     Conjunctiva/sclera: Conjunctivae normal.     Pupils: Pupils are equal, round, and reactive to light.  Neck:     Thyroid: No thyromegaly.     Vascular: No JVD.  Cardiovascular:     Rate and Rhythm: Normal rate and regular rhythm.     Heart sounds: Normal heart sounds. No murmur heard.   No friction rub. No gallop.  Pulmonary:     Effort: Pulmonary effort is normal. No respiratory distress.     Breath sounds: Normal breath sounds. No wheezing or rales.  Abdominal:     General: Bowel sounds are normal. There is no distension.     Palpations: Abdomen is soft. There is no mass.     Tenderness: There is no abdominal tenderness.  Musculoskeletal:        General: No tenderness. Normal range of motion.     Cervical back: Normal range of motion and neck supple.     Right lower leg: No edema.      Left lower leg: No edema.  Lymphadenopathy:     Cervical: No cervical adenopathy.  Skin:    General: Skin is warm and dry.     Findings: No erythema or rash.  Neurological:     Mental Status: She is alert.     Coordination: Coordination normal.  Psychiatric:        Behavior: Behavior normal.  ED Results / Procedures / Treatments   Labs (all labs ordered are listed, but only abnormal results are displayed) Labs Reviewed  CBC - Abnormal; Notable for the following components:      Result Value   Hemoglobin 10.8 (*)    HCT 34.1 (*)    RDW 15.7 (*)    All other components within normal limits  APTT  PROTIME-INR  BASIC METABOLIC PANEL  CBG MONITORING, ED  TROPONIN I (HIGH SENSITIVITY)    EKG EKG Interpretation  Date/Time:  Thursday July 13 2021 12:35:54 EST Ventricular Rate:  58 PR Interval:  176 QRS Duration: 172 QT Interval:  464 QTC Calculation: 456 R Axis:   -15 Text Interpretation: Sinus rhythm Left bundle branch block since last tracing no significant change Confirmed by Noemi Chapel [phone]) on 07/13/2021 12:46:34 PM  Radiology DG Chest Portable 1 View  Result Date: 07/13/2021 CLINICAL DATA:  Chest pain since this morning EXAM: PORTABLE CHEST 1 VIEW COMPARISON:  Chest radiograph 10/08/2020 FINDINGS: The heart is enlarged, unchanged. The upper mediastinal contours are normal. There is no focal consolidation or pulmonary edema. There is no pleural effusion or pneumothorax There is no acute osseous abnormality. Postsurgical changes are noted in the left humerus. IMPRESSION: Unchanged cardiomegaly. No radiographic evidence of acute cardiopulmonary process. Electronically Signed   By: Valetta Mole M.D.   On: 07/13/2021 13:15    Procedures Procedures    Medications Ordered in ED Medications  aspirin chewable tablet 324 mg (324 mg Oral Not Given 07/13/21 1301)  0.9 %  sodium chloride infusion ( Intravenous New Bag/Given 07/13/21 1310)  nitroGLYCERIN (NITROSTAT) SL  tablet 0.4 mg (has no administration in time range)    ED Course/ Medical Decision Making/ A&P                           Medical Decision Making Amount and/or Complexity of Data Reviewed Labs: ordered. Radiology: ordered.  Risk OTC drugs. Prescription drug management. Decision regarding hospitalization.   The patient appears to be in no distress at this time but has ongoing symptoms that are certainly concerning given her history of obstructive disease.  EKG does show left bundle branch block, the patient has no obvious STEMI criteria based on the left bundle branch block.  We will obtain a troponin and likely admit this patient to the hospital given her high risk status.  Blood pressure is also elevated at 147/106.  She has had no aspirin or nitroglycerin today  This patient presents to the ED for concern of chest pain, this involves an extensive number of treatment options, and is a complaint that carries with it a high risk of complications and morbidity.  The differential diagnosis includes acute coronary syndrome, pneumonia, pneumothorax, pulmonary embolism seems much less likely given no tachycardia or significant hypoxia   Co morbidities that complicate the patient evaluation  History of hypertension and prior coronary disease with an ischemic cardiomyopathy and congestive heart failure   Additional history obtained:  Additional history obtained from electronic medical record External records from outside source obtained and reviewed including prior heart catheterization from 2018 and echocardiogram from this year, see history of present illness   Lab Tests:  I Ordered, and personally interpreted labs.  The pertinent results include: CBC and metabolic panel which are overall unremarkable, mild anemia but similar to prior values.  Metabolic panel shows no renal dysfunction and troponin measured at the level of 8.   Imaging  Studies ordered:  I ordered imaging studies  including chest xray  I independently visualized and interpreted imaging which showed cardiomegaly, no other acute process I agree with the radiologist interpretation   Cardiac Monitoring:  The patient was maintained on a cardiac monitor.  I personally viewed and interpreted the cardiac monitored which showed an underlying rhythm of: sinus rhythm   Medicines ordered and prescription drug management:  I ordered medication including ASA and nitro  for chest pain and presumed ACS  Reevaluation of the patient after these medicines showed that the patient improved I have reviewed the patients home medicines and have made adjustments as needed   Critical Interventions:  Aspirin and nitroglycerin IV fluids Consultation with cardiology   Consultations Obtained:  I requested consultation with the cardiologist, Dr.Chadrasekhar,  and discussed lab and imaging findings as well as pertinent plan - they recommend: admission for observation to the hospitalist, they will come to see the patient in consultation Discussed with Dr. Dyann Kief of the hospitalist service who will admit the patient to the hospital   Problem List / ED Course:  The patient had improvement of her symptoms but they are still waxing and waning and she still has mild symptoms at rest.  Initial troponin negative but will be admitted to the hospital for observation and cardiac rule out.    Social Determinants of Health:  None           Final Clinical Impression(s) / ED Diagnoses Final diagnoses:  Chest pain due to coronary artery disease [facility])    Rx / DC Orders ED Discharge Orders     None         Noemi Chapel, MD 07/14/21 [phone]

## 2021-07-13 NOTE — Assessment & Plan Note (Addendum)
-  LDL in the 130s range ?-Will continue Lipitor ?-Patient advised to follow heart healthy diet. ?-Repeat LFTs and lipid panel in 8-12 weeks ?

## 2021-07-13 NOTE — Assessment & Plan Note (Addendum)
-  No wheezing or acute complaints of shortness of breath. ?-Continue as needed bronchodilators and resumption of Telegy Ellipta. ?-Continue outpatient follow-up with pulmonologist. ?

## 2021-07-13 NOTE — Assessment & Plan Note (Addendum)
-  Appears to be stable and compensated ?-Continue to follow daily weights and low-sodium diet. ?-Continue the use of Coreg and Entresto. ?-Continue outpatient follow-up with cardiology service. ?

## 2021-07-13 NOTE — Assessment & Plan Note (Signed)
-  Continue as needed Tranxene ?-Mood overall stable. ?

## 2021-07-13 NOTE — H&P (Signed)
History and Physical    Patient: [name] KDX:[id number] DOB: Sep 03, 1935 DOA: 07/13/2021 DOS: the patient was seen and examined on 07/13/2021 PCP: Pcp, No  Patient coming from: Home  Chief Complaint:  Chief Complaint  Patient presents with   Chest Pain    HPI: [name] is a 86 y.o. female with medical history significant of coronary artery disease status post stent placement; essential hypertension, hyperlipidemia, prior history of MI, chronic systolic heart failure/ischemic cardiomyopathy and COPD; who presented to the hospital secondary to chest pain syndrome.  Patient reports some midepigastric tenderness/burning sensation associated with feeling DC and is slightly short of breath while having breakfast.  There was no clear dyspnea on exertion, orthopnea, radiation of pain to her jaw or left arm.  Patient denies palpitations, syncope, any other associated symptoms to her pain; fever, sick contacts or any other complaints.  Patient reports to be compliant with medication.  In the ED initial troponin was 80; EKG demonstrating sinus bradycardia with unchanged left bundle branch block; patient received aspirin and cardiology service was consulted.  Plan is to admit patient for further chest pain rule out work-up.  Review of Systems: As mentioned in the history of present illness. All other systems reviewed and are negative. Past Medical History:  Diagnosis Date   Acute on chronic respiratory failure with hypoxia (HCC) 04/24/2018   Anxiety    Atypical chest pain    Bronchospasm    CAD (coronary artery disease)    a. s/p DES to mid-LAD and DES to mid-OM1 in 11/2016   Cervical disc disorder with myelopathy, unspecified cervical region    Cervicalgia 01/19/2009   Qualifier: Diagnosis of  By: Aline Brochure MD, Stanley     Chest pain at rest 9/76/7341   Chronic systolic (congestive) heart failure (HCC)    COPD (chronic obstructive pulmonary disease) (HCC)    Coronary artery disease     De Quervain's disease (tenosynovitis) 04/02/2011   Disc disease with myelopathy, cervical    Essential hypertension    Hemorrhoids    Liver mass    Lung, cysts, congenital    Left lung cyst   Myocardial infarction ([place])    Nephrolithiasis    Embedded   Nonischemic cardiomyopathy ([place])    LVEF 35-40% 2015   On home O2    Osteoarthritis    Sprain of wrist 08/07/2012   Thoracic ascending aortic aneurysm    4.3 cm April 2016   Past Surgical History:  Procedure Laterality Date   Benign breast tumors     CHOLECYSTECTOMY     COLONOSCOPY     COLONOSCOPY N/A 09/22/2014   Procedure: COLONOSCOPY;  Surgeon: Rogene Houston, MD;  [facility];  Service: Endoscopy;  Laterality: N/A;  830 -- to be done in OR under fluoro   Complete hysterectomy     CORONARY STENT INTERVENTION N/A 11/23/2016   Procedure: Coronary Stent Intervention;  Surgeon: Martinique, Peter M, MD;  Location: [facility];  Service: Cardiovascular;  Laterality: N/A;   LEFT HEART CATH AND CORONARY ANGIOGRAPHY N/A 11/23/2016   Procedure: Left Heart Cath and Coronary Angiography;  Surgeon: Martinique, Peter M, MD;  Location: [facility];  Service: Cardiovascular;  Laterality: N/A;   TONSILLECTOMY AND ADENOIDECTOMY     Social History:  reports that she quit smoking about 13 years ago. Her smoking use included cigarettes. She started smoking about 44 years ago. She has a 0.75 pack-year smoking history. She has never used  smokeless tobacco. She reports that she does not drink alcohol and does not use drugs.  Allergies  Allergen Reactions   Plavix [Clopidogrel Bisulfate] Itching    Severe itching   Alprazolam Nausea And Vomiting   Codeine Nausea And Vomiting   Percodan [Oxycodone-Aspirin] Nausea And Vomiting   Valium Nausea And Vomiting    Family History  Problem Relation Age of Onset   Heart disease Mother    Aneurysm Father    Lung cancer Brother    Heart disease Sister    Diabetes Brother    Heart disease  Brother     Prior to Admission medications   Medication Sig Start Date End Date Taking? Authorizing Provider  albuterol (VENTOLIN HFA) 108 (90 Base) MCG/ACT inhaler Inhale 2 puffs into the lungs every 4 (four) hours as needed for wheezing or shortness of breath. 10/25/20  Yes Chesley Mires, MD  Ascorbic Acid (VITAMIN C) 100 MG tablet Take 100 mg by mouth daily.   Yes [provider]  aspirin EC 81 MG tablet Take 81 mg by mouth every morning.    Yes [provider]  carvedilol (COREG) 12.5 MG tablet TAKE (1) TABLET BY MOUTH TWICE DAILY. Patient taking differently: Take 12.5 mg by mouth 2 (two) times daily with a meal. 10/25/20  Yes Strader, [place] M, PA-C  Cholecalciferol (VITAMIN D3) 10 MCG (400 UNIT) CAPS Take 1 capsule by mouth daily.   Yes [provider]  clorazepate (TRANXENE) 7.5 MG tablet TAKE 1 TABLET BY MOUTH ONCE A DAY AS NEEDED FOR ANXIETY. 06/21/21  Yes Patel, Colin Broach, MD  Cranberry 500 MG TABS Take 1 tablet by mouth daily.   Yes [provider]  ENTRESTO 24-26 MG TAKE 1 TABLET BY MOUTH TWICE DAILY. 03/30/20  Yes BranchAlphonse Guild, MD  Fluticasone-Umeclidin-Vilant (TRELEGY ELLIPTA) 100-62.5-25 MCG/INH AEPB Inhale 1 puff into the lungs daily. 10/25/20  Yes Chesley Mires, MD  meclizine (ANTIVERT) 25 MG tablet Take 1 tablet (25 mg total) by mouth as needed for dizziness. 04/19/21  Yes Josue Hector, MD  nitroGLYCERIN (NITROSTAT) 0.4 MG SL tablet Place 1 tablet (0.4 mg total) under the tongue every 5 (five) minutes x 3 doses as needed for chest pain. 07/21/20  Yes [name], NP  OXYGEN Inhale 2 L into the lungs at bedtime.   Yes [provider]  traMADol (ULTRAM) 50 MG tablet TAKE ONE TABLET BY MOUTH ONCE DAILY AS NEEDED. Patient taking differently: Take 50 mg by mouth daily as needed for moderate pain. 06/21/21  Yes Lindell Spar, MD  albuterol (PROVENTIL) (2.5 MG/3ML) 0.083% nebulizer solution Take 3 mLs (2.5 mg total) by nebulization every  6 (six) hours as needed for wheezing or shortness of breath. Patient not taking: Reported on 07/13/2021 07/13/15   Sinda Du, MD  miconazole (MICOTIN) 2 % cream Apply 1 application topically 2 (two) times daily. 03/30/21   Melynda Ripple, MD  spironolactone (ALDACTONE) 25 MG tablet Take as needed for shortness of breath,swelling or more than 3 lb weight gain over night Patient not taking: Reported on 07/13/2021 05/30/21   Josue Hector, MD  triamcinolone cream (KENALOG) 0.1 % Apply 1 application topically 2 (two) times daily. Patient not taking: Reported on 07/13/2021 02/28/21   Fayrene Helper, MD    Physical Exam: Vitals:   07/13/21 1600 07/13/21 1800 07/13/21 1808 07/13/21 1826  BP: (!) 133/59 113/60  130/75  Pulse: 70 66  80  Resp: 20 14  16  Temp:   98.1 F (36.7 C) 99.1 F (37.3 C)  TempSrc:   Oral Oral  SpO2: 94% 96%  99%  Weight:      Height:       General exam: Alert, awake, oriented x 3; currently chest pain-free; no nausea or vomiting.  Reports to be hungry. Respiratory system: Clear to auscultation. Respiratory effort normal.  No requiring oxygen supplementation.  Good saturation on examination. patient no using accessory muscle. Cardiovascular system: Rate controlled, no rubs, no gallops, no JVD. Gastrointestinal system: Abdomen is nondistended, soft and nontender. No organomegaly or masses felt. Normal bowel sounds heard. Central nervous system: Alert and oriented. No focal neurological deficits. Extremities: No cyanosis or clubbing. Skin: No rashes, no petechiae. Psychiatry: Judgement and insight appear normal. Mood & affect appropriate.    Data Reviewed: Initial high sensitive troponin was 8 COVID PCR negative Hemoglobin 10.8; WBCs within normal limits and is stable platelets count. EKG demonstrating unchanged left bundle branch block and no signs of acute ischemia.  Sinus bradycardia. Chest x-ray without acute cardiopulmonary process. Normal  TSH.  Assessment and Plan: * Chest pain - Patient with significant risk factors and a heart score of 5 -Continue aspirin, beta-blocker and statins -Will cycle troponin and EKG -Telemetry monitoring -Cardiology service has been consulted and will follow recommendation for further restratification -So far based on our discussion plan will be for nuclear stress test if troponins remain stable and patient continues to be chest pain-free. -Will check lipid panel.  HLD (hyperlipidemia) - Will continue Lipitor -Follow lipid panel.  COPD (chronic obstructive pulmonary disease) (HCC) - No wheezing or acute complaints of shortness of breath. -Continue as needed bronchodilators and resumption of Telegy Ellipta equivalent  GERD (gastroesophageal reflux disease) -Continue PPI  Anxiety -Continue as needed Tranxene -Mood overall stable.  Essential hypertension - Stable and well-controlled -Continue current antihypertensive regimen -Follow vital signs.  Chronic combined systolic and diastolic CHF (congestive heart failure) (HCC) - Appears to be stable and compensated -Continue to follow strict I's and O's and daily weight -Continue the use of Coreg and Entresto. -Continue outpatient follow-up with cardiology service.     Advance Care Planning:   Code Status: Full Code   Consults: Cardiology service  Family Communication: No family at bedside  Severity of Illness: The appropriate patient status for this patient is OBSERVATION. Observation status is judged to be reasonable and necessary in order to provide the required intensity of service to ensure the patient's safety. The patient's presenting symptoms, physical exam findings, and initial radiographic and laboratory data in the context of their medical condition is felt to place them at decreased risk for further clinical deterioration. Furthermore, it is anticipated that the patient will be medically stable for discharge from the  hospital within 2 midnights of admission.   Author: Barton Dubois, MD 07/13/2021 6:49 PM  For on call review [email or web address].

## 2021-07-13 NOTE — Consult Note (Signed)
Cardiology Consult:   Patient ID: [name] MRN: [id number]; DOB: 07/15/1935   Admission date: 07/13/2021  PCP:  Pcp, No   [facility] Providers Cardiologist:  [name] Sable, MD (Inactive)       Chief Complaint:  Chest pain Syndrome  Patient Profile:   [name] is a 86 y.o. female with CAD (s/p DES to mid-LAD and DES to mid-OM1 in 56/3149), chronic systolic CHF/ICM (EF 70-26% in 2018, at 45% by repeat echo in 12/2018), HTN, HLD, RA, dilation of ascending aorta (at 4.2 cm by imaging in 2019) and COPD  who is being seen 07/13/2021 for the evaluation of atypical chest pain.  History of Present Illness:   [name] that she has been doing well until this morning.   She notes that her first MI she has anginal: chest heaviness that radiates to her left arm associated with nausea, vomiting, and diaphoresis.  She was taking care of her now deceases younger sister at the time and needed EMS.    She doesn't remember her visit 04/19/21 with [name], but does remember [name].  This morning she woke up and felt dizzy with breakfast.  She was planning to get some laundry folded that she had not done yesterday when she felt her eyes getting very narrow and had vertigo.  She sleeps with nocturnal O2 but ended up needing to put her oxygen on.  Her home pulse oximetry showed a heart rate of 36 and a Sat of 83%.  She notes that she had burning chest discomfort that lead to her getting evaluated.  No SOB, DOE, PND orthopnea.  No syncope.  No palpitations.  To other provider she noted anginal chest pain that radiates to her neck.  Seeing her this afternoon, this felt different from her prior event.  As part of her work up; hscTrop 8. EKG: Sinus bradycardia with LBBB (similar to prior) Given ASA and is pending her home medications.     Past Medical History:  Diagnosis Date   Acute on chronic respiratory failure with hypoxia (HCC) 04/24/2018   Anxiety    Atypical chest pain     Bronchospasm    CAD (coronary artery disease)    a. s/p DES to mid-LAD and DES to mid-OM1 in 11/2016   Cervical disc disorder with myelopathy, unspecified cervical region    Cervicalgia 01/19/2009   Qualifier: Diagnosis of  By: [name] Brochure MD, Stanley     Chest pain at rest 3/78/5885   Chronic systolic (congestive) heart failure (HCC)    COPD (chronic obstructive pulmonary disease) (HCC)    Coronary artery disease    De Quervain's disease (tenosynovitis) 04/02/2011   Disc disease with myelopathy, cervical    Essential hypertension    Hemorrhoids    Liver mass    Lung, cysts, congenital    Left lung cyst   Myocardial infarction ([place])    Nephrolithiasis    Embedded   Nonischemic cardiomyopathy ([place])    LVEF 35-40% 2015   On home O2    Osteoarthritis    Sprain of wrist 08/07/2012   Thoracic ascending aortic aneurysm    4.3 cm April 2016    Past Surgical History:  Procedure Laterality Date   Benign breast tumors     CHOLECYSTECTOMY     COLONOSCOPY     COLONOSCOPY N/A 09/22/2014   Procedure: COLONOSCOPY;  Surgeon: [name] Houston, MD;  [facility];  Service: Endoscopy;  Laterality: N/A;  830 --  to be done in OR under fluoro   Complete hysterectomy     CORONARY STENT INTERVENTION N/A 11/23/2016   Procedure: Coronary Stent Intervention;  Surgeon: [name], Peter M, MD;  Location: [place] [facility];  Service: Cardiovascular;  Laterality: N/A;   LEFT HEART CATH AND CORONARY ANGIOGRAPHY N/A 11/23/2016   Procedure: Left Heart Cath and Coronary Angiography;  Surgeon: [name], Peter M, MD;  Location: [place] [facility];  Service: Cardiovascular;  Laterality: N/A;   TONSILLECTOMY AND ADENOIDECTOMY       Medications Prior to Admission: Prior to Admission medications   Medication Sig Start Date End Date Taking? Authorizing Provider  albuterol (VENTOLIN HFA) 108 (90 Base) MCG/ACT inhaler Inhale 2 puffs into the lungs every 4 (four) hours as needed for wheezing or shortness of breath.  10/25/20  Yes [name] Mires, MD  Ascorbic Acid (VITAMIN C) 100 MG tablet Take 100 mg by mouth daily.   Yes [provider]  aspirin EC 81 MG tablet Take 81 mg by mouth every morning.    Yes [provider]  carvedilol (COREG) 12.5 MG tablet TAKE (1) TABLET BY MOUTH TWICE DAILY. Patient taking differently: Take 12.5 mg by mouth 2 (two) times daily with a meal. 10/25/20  Yes [name], [name] M, PA-C  Cholecalciferol (VITAMIN D3) 10 MCG (400 UNIT) CAPS Take 1 capsule by mouth daily.   Yes [provider]  clorazepate (TRANXENE) 7.5 MG tablet TAKE 1 TABLET BY MOUTH ONCE A DAY AS NEEDED FOR ANXIETY. 06/21/21  Yes Patel, Colin Broach, MD  Cranberry 500 MG TABS Take 1 tablet by mouth daily.   Yes [provider]  ENTRESTO 24-26 MG TAKE 1 TABLET BY MOUTH TWICE DAILY. 03/30/20  Yes [name] Guild, MD  Fluticasone-Umeclidin-Vilant (TRELEGY ELLIPTA) 100-62.5-25 MCG/INH AEPB Inhale 1 puff into the lungs daily. 10/25/20  Yes [name] Mires, MD  meclizine (ANTIVERT) 25 MG tablet Take 1 tablet (25 mg total) by mouth as needed for dizziness. 04/19/21  Yes [name] Hector, MD  nitroGLYCERIN (NITROSTAT) 0.4 MG SL tablet Place 1 tablet (0.4 mg total) under the tongue every 5 (five) minutes x 3 doses as needed for chest pain. 07/21/20  Yes [name] Larsson, NP  OXYGEN Inhale 2 L into the lungs at bedtime.   Yes [provider]  traMADol (ULTRAM) 50 MG tablet TAKE ONE TABLET BY MOUTH ONCE DAILY AS NEEDED. Patient taking differently: Take 50 mg by mouth daily as needed for moderate pain. 06/21/21  Yes [name] Spar, MD  albuterol (PROVENTIL) (2.5 MG/3ML) 0.083% nebulizer solution Take 3 mLs (2.5 mg total) by nebulization every 6 (six) hours as needed for wheezing or shortness of breath. Patient not taking: Reported on 07/13/2021 07/13/15   [name] Du, MD  miconazole (MICOTIN) 2 % cream Apply 1 application topically 2 (two) times daily. 03/30/21   [name] Ripple, MD  spironolactone  (ALDACTONE) 25 MG tablet Take as needed for shortness of breath,swelling or more than 3 lb weight gain over night Patient not taking: Reported on 07/13/2021 05/30/21   [name] Hector, MD  triamcinolone cream (KENALOG) 0.1 % Apply 1 application topically 2 (two) times daily. Patient not taking: Reported on 07/13/2021 02/28/21   Fayrene Helper, MD     Allergies:    Allergies  Allergen Reactions   Plavix [Clopidogrel Bisulfate] Itching    Severe itching   Alprazolam Nausea And Vomiting   Codeine Nausea And Vomiting   Percodan [Oxycodone-Aspirin] Nausea And Vomiting  Valium Nausea And Vomiting    Social History:   Social History   Socioeconomic History   Marital status: Widowed    Spouse name: Not on file   Number of children: Not on file   Years of education: 9th   Highest education level: Not on file  Occupational History    Employer: RETIRED  Tobacco Use   Smoking status: Former    Packs/day: 0.25    Years: 3.00    Pack years: 0.75    Types: Cigarettes    Start date: 05/14/1977    Quit date: 05/28/2008    Years since quitting: 13.1   Smokeless tobacco: Never   Tobacco comments:    patient states she only smoked for 3 years total  Vaping Use   Vaping Use: Never used  Substance and Sexual Activity   Alcohol use: No    Alcohol/week: 0.0 standard drinks   Drug use: No   Sexual activity: Not Currently    Birth control/protection: Surgical    Comment: hyst  Other Topics Concern   Not on file  Social History Narrative   Not on file   Social Determinants of Health   Financial Resource Strain: Low Risk    Difficulty of Paying Living Expenses: Not very hard  Food Insecurity: No Food Insecurity   Worried About Running Out of Food in the Last Year: Never true   Ran Out of Food in the Last Year: Never true  Transportation Needs: No Transportation Needs   Lack of Transportation (Medical): No   Lack of Transportation (Non-Medical): No  Physical Activity: Inactive    Days of Exercise per Week: 0 days   Minutes of Exercise per Session: 0 min  Stress: Stress Concern Present   Feeling of Stress : Very much  Social Connections: Socially Isolated   Frequency of Communication with Friends and Family: Three times a week   Frequency of Social Gatherings with Friends and Family: Twice a week   Attends Religious Services: Never   Marine scientist or Organizations: No   Attends Archivist Meetings: Never   Marital Status: Widowed  Human resources officer Violence: Not At Risk   Fear of Current or Ex-Partner: No   Emotionally Abused: No   Physically Abused: No   Sexually Abused: No    Family History:   The patient's family history includes Aneurysm in her father; Diabetes in her brother; Heart disease in her brother, mother, and sister; Lung cancer in her brother.    ROS:  Please see the history of present illness.  All other ROS reviewed and negative.     Physical Exam/Data:   Vitals:   07/13/21 1400 07/13/21 1500 07/13/21 1530 07/13/21 1600  BP: (!) 128/53 124/60 (!) 131/119 (!) 133/59  Pulse: 65 70 65 70  Resp: 14 16 16 20   Temp:      TempSrc:      SpO2: 97% 95% 95% 94%  Weight:      Height:        Intake/Output Summary (Last 24 hours) at 07/13/2021 1734 Last data filed at 07/13/2021 1623 Gross per 24 hour  Intake 402.08 ml  Output --  Net 402.08 ml   Last 3 Weights 07/13/2021 04/24/2021 04/19/2021  Weight (lbs) 180 lb 12.4 oz 178 lb 177 lb 9.6 oz  Weight (kg) 82 kg 80.74 kg 80.559 kg     Body mass index is 33.06 kg/[name].   Gen: no distress, elderly female   Neck:  No JVD,  carotid bruit Ears:  bilateral Pilar Plate Sign Cardiac: No Rubs or Gallops, soft holosystolic murmur, RRR +2 radial pulses Respiratory: expiratory wheeze at time of exam, normal effort, normal  respiratory rate GI: Soft, nontender, non-distended  MS: No  edema;  moves all extremities Integument: Skin feels warm Neuro:  At time of evaluation, alert and oriented to  person/place/time/situation  Psych: Normal affect, patient feels ok    Laboratory Data:  High Sensitivity Troponin:   Recent Labs  Lab 07/13/21 1306  TROPONINIHS 8      Chemistry Recent Labs  Lab 07/13/21 1306  NA 136  K 4.1  CL 103  CO2 26  GLUCOSE 94  BUN 14  CREATININE 0.71  CALCIUM 9.3  GFRNONAA >60  ANIONGAP 7    No results for input(s): PROT, ALBUMIN, AST, ALT, ALKPHOS, BILITOT in the last 168 hours. Lipids No results for input(s): CHOL, TRIG, HDL, LABVLDL, LDLCALC, CHOLHDL in the last 168 hours. Hematology Recent Labs  Lab 07/13/21 1306  WBC 7.7  RBC 4.11  HGB 10.8*  HCT 34.1*  MCV 83.0  MCH 26.3  MCHC 31.7  RDW 15.7*  PLT 306   Thyroid  Recent Labs  Lab 07/13/21 1459  TSH 1.432   BNPNo results for input(s): BNP, PROBNP in the last 168 hours.  DDimer No results for input(s): DDIMER in the last 168 hours.   Radiology/Studies:  DG Chest Portable 1 View  Result Date: 07/13/2021 CLINICAL DATA:  Chest pain since this morning EXAM: PORTABLE CHEST 1 VIEW COMPARISON:  Chest radiograph 10/08/2020 FINDINGS: The heart is enlarged, unchanged. The upper mediastinal contours are normal. There is no focal consolidation or pulmonary edema. There is no pleural effusion or pneumothorax There is no acute osseous abnormality. Postsurgical changes are noted in the left humerus. IMPRESSION: Unchanged cardiomegaly. No radiographic evidence of acute cardiopulmonary process. Electronically Signed   By: Valetta Mole [name].D.   On: 07/13/2021 13:15     Assessment and Plan:   Atypical chest pain in the setting of known CAD HFrEF Ischemic cardiomyopathy Bradycardia NOS Vertigo COPD - unclear this was cardiac in origin, but given risk factors would rule out significant CAD - assuming second troponin is < 100; will plan for NM Stress (Lexiscan given LBBB; patient is consented and an order was placed by my team) - ok to start coreg but keep on telemetry, if new complete heart  block will need to [name] lexican - if significant rise in troponin, would start heparin and transfer to [facility] - ASA 81, s/p load - on low dose entresto - rest as per primary  For questions or updates, please contact [place] Please consult [email or web address] for contact info under     Signed, Werner Lean, MD  07/13/2021 5:34 PM

## 2021-07-13 NOTE — Assessment & Plan Note (Addendum)
-  Patient with significant risk factors and a heart score of 5 ?-Negative troponin and no acute ischemic changes on telemetry or EKG ?-Nuclear stress test with evidence of prior inferior infarct, apical infarct with mild ischemia.  Intermediate risk mainly due to decrease left ventricle ejection fraction, and prior mild ischemia.  No further inpatient work-up anticipated.  Plan is to continue medical management.   ?-Continue aspirin, beta-blocker, statins and newly added 15 mg of Imdur daily. ?-Appreciate cardiology service recommendations and assistance; patient will follow-up with cardiology at discharge. ? ?

## 2021-07-13 NOTE — Assessment & Plan Note (Addendum)
-  Continue PPI. °-Lifestyle changes discussed with patient. °

## 2021-07-13 NOTE — Assessment & Plan Note (Addendum)
-  Stable and well-controlled ?-Continue current antihypertensive regimen ?-Follow vital signs and continue heart healthy diet.Marland Kitchen ?

## 2021-07-14 ENCOUNTER — Encounter (HOSPITAL_COMMUNITY): Payer: Self-pay | Admitting: Internal Medicine

## 2021-07-14 ENCOUNTER — Observation Stay (HOSPITAL_BASED_OUTPATIENT_CLINIC_OR_DEPARTMENT_OTHER): Payer: Medicare Other

## 2021-07-14 ENCOUNTER — Other Ambulatory Visit: Payer: Self-pay | Admitting: Cardiology

## 2021-07-14 DIAGNOSIS — K219 Gastro-esophageal reflux disease without esophagitis: Secondary | ICD-10-CM | POA: Diagnosis not present

## 2021-07-14 DIAGNOSIS — J432 Centrilobular emphysema: Secondary | ICD-10-CM | POA: Diagnosis not present

## 2021-07-14 DIAGNOSIS — I5042 Chronic combined systolic (congestive) and diastolic (congestive) heart failure: Secondary | ICD-10-CM | POA: Diagnosis not present

## 2021-07-14 DIAGNOSIS — E7800 Pure hypercholesterolemia, unspecified: Secondary | ICD-10-CM

## 2021-07-14 DIAGNOSIS — I10 Essential (primary) hypertension: Secondary | ICD-10-CM | POA: Diagnosis not present

## 2021-07-14 DIAGNOSIS — R079 Chest pain, unspecified: Secondary | ICD-10-CM | POA: Diagnosis not present

## 2021-07-14 DIAGNOSIS — F419 Anxiety disorder, unspecified: Secondary | ICD-10-CM | POA: Diagnosis not present

## 2021-07-14 DIAGNOSIS — I25119 Atherosclerotic heart disease of native coronary artery with unspecified angina pectoris: Secondary | ICD-10-CM

## 2021-07-14 LAB — NM MYOCAR MULTI W/SPECT W/WALL MOTION / EF
LV dias vol: 152 mL (ref 46–106)
LV sys vol: 96 mL
Nuc Stress EF: 36 %
Peak HR: 96 {beats}/min
RATE: 0.4
Rest HR: 68 {beats}/min
Rest Nuclear Isotope Dose: 10.2 mCi
SDS: 10
SRS: 2
SSS: 12
ST Depression (mm): 0 mm
Stress Nuclear Isotope Dose: 29.7 mCi
TID: 1.03

## 2021-07-14 LAB — BASIC METABOLIC PANEL
Anion gap: 7 (ref 5–15)
BUN: 13 mg/dL (ref 8–23)
CO2: 26 mmol/L (ref 22–32)
Calcium: 9.5 mg/dL (ref 8.9–10.3)
Chloride: 106 mmol/L (ref 98–111)
Creatinine, Ser: 0.63 mg/dL (ref 0.44–1.00)
GFR, Estimated: >60 mL/min (ref >60)
Glucose, Bld: 98 mg/dL (ref 70–99)
Potassium: 4.2 mmol/L (ref 3.5–5.1)
Sodium: 139 mmol/L (ref 135–145)

## 2021-07-14 LAB — CBC
HCT: 35.9 % — ABNORMAL LOW (ref 36.0–46.0)
Hemoglobin: 11.3 g/dL — ABNORMAL LOW (ref 12.0–15.0)
MCH: 26.2 pg (ref 26.0–34.0)
MCHC: 31.5 g/dL (ref 30.0–36.0)
MCV: 83.1 fL (ref 80.0–100.0)
Platelets: 316 10*3/uL (ref 150–400)
RBC: 4.32 MIL/uL (ref 3.87–5.11)
RDW: 15.7 % — ABNORMAL HIGH (ref 11.5–15.5)
WBC: 6.2 10*3/uL (ref 4.0–10.5)
nRBC: 0 % (ref 0.0–0.2)

## 2021-07-14 MED ORDER — REGADENOSON 0.4 MG/5ML IV SOLN
INTRAVENOUS | Status: AC
  Administered 2021-07-14: 0.4 mg via INTRAVENOUS
  Filled 2021-07-14: qty 5

## 2021-07-14 MED ORDER — TECHNETIUM TC 99M TETROFOSMIN IV KIT
30.0000 | PACK | Freq: Once | INTRAVENOUS | Status: AC | PRN
  Administered 2021-07-14: 29.7 via INTRAVENOUS

## 2021-07-14 MED ORDER — ATORVASTATIN CALCIUM 40 MG PO TABS: 40.0000 mg | ORAL_TABLET | Freq: Every day | ORAL | 2 refills | Status: DC

## 2021-07-14 MED ORDER — SODIUM CHLORIDE FLUSH 0.9 % IV SOLN
INTRAVENOUS | Status: AC
  Administered 2021-07-14: 10 mL via INTRAVENOUS
  Filled 2021-07-14: qty 10

## 2021-07-14 MED ORDER — PANTOPRAZOLE SODIUM 40 MG PO TBEC: 40.0000 mg | DELAYED_RELEASE_TABLET | Freq: Every day | ORAL | 1 refills | Status: DC

## 2021-07-14 MED ORDER — TECHNETIUM TC 99M TETROFOSMIN IV KIT
10.0000 | PACK | Freq: Once | INTRAVENOUS | Status: AC | PRN
  Administered 2021-07-14: 10.2 via INTRAVENOUS

## 2021-07-14 MED ORDER — ISOSORBIDE MONONITRATE ER 30 MG PO TB24
15.0000 mg | ORAL_TABLET | Freq: Every day | ORAL | Status: DC
  Administered 2021-07-14: 15 mg via ORAL
  Filled 2021-07-14: qty 1

## 2021-07-14 MED ORDER — CARVEDILOL 6.25 MG PO TABS: 6.2500 mg | ORAL_TABLET | Freq: Two times a day (BID) | ORAL | 2 refills | Status: DC

## 2021-07-14 MED ORDER — ISOSORBIDE MONONITRATE ER 30 MG PO TB24: 15.0000 mg | ORAL_TABLET | Freq: Every day | ORAL | 2 refills | Status: DC

## 2021-07-14 NOTE — TOC Transition Note (Signed)
Transition of Care (TOC) - CM/SW Discharge Note ? ? ?Patient Details  ?Name: [name] ?MRN: [id number] ?Date of Birth: Jan 12, 1936 ? ?Transition of Care (TOC) CM/SW Contact:  ?Boneta Lucks, RN ?Phone Number: ?07/14/2021, 12:52 PM ? ? ?Clinical Narrative:   Patient admitted in OBS for chest pain. Patient discharging today wishes to go home with home health RN for med management. MD updated to order. Vaughan Basta with [facility] accepted the referral. Patient goes to [facility].  ? ? ? ?Final next level of care: [place] ?Barriers to Discharge: Barriers Resolved ? ? ?Patient Goals and CMS Choice ?Patient states their goals for this hospitalization and ongoing recovery are:: to go home. ?CMS Medicare.gov Compare Post Acute Care list provided to:: Patient ?Choice offered to / list presented to : Patient ? ?Discharge Placement ?  ?           ?  ?  ?  ?Patient and family notified of of transfer: 07/14/21 ? ?Discharge Plan and Services ?  ?  ?           ?  ?  ?  ?  ?  ?  ?[place] Agency: [place] ([place]) ?Date HH Agency Contacted: 07/14/21 ?Time [place]: 8588 ?Representative spoke with at [place]: Vaughan Basta ? ?Social Determinants of Health (SDOH) Interventions ?  ? ? ?Readmission Risk Interventions ?No flowsheet data found. ? ? ? ? ?

## 2021-07-14 NOTE — Discharge Summary (Signed)
Physician Discharge Summary   Patient: [name] MRN: [id number] DOB: February 04, 1936  Admit date:     07/13/2021  Discharge date: 07/14/21  Discharge Physician: Barton Dubois   PCP: Fayrene Helper, MD   Recommendations at discharge:  Repeat basic metabolic panel to follow ultralights renal function Reassess blood pressure and further adjust antihypertensive regimen as required. Make sure patient has follow-up with cardiology service as instructed Continue to assess patient's volume status and resolution of chest discomfort.  Discharge Diagnoses: Principal Problem:   Chest pain due to coronary artery disease (HCC) Active Problems:   Chronic combined systolic and diastolic CHF (congestive heart failure) (HCC)   Essential hypertension   Anxiety   GERD (gastroesophageal reflux disease)   COPD (chronic obstructive pulmonary disease) ([place])   HLD (hyperlipidemia)  Brief Hospital admission narrative course: [name] is a 86 y.o. female with medical history significant of coronary artery disease status post stent placement; essential hypertension, hyperlipidemia, prior history of MI, chronic systolic heart failure/ischemic cardiomyopathy and COPD; who presented to the hospital secondary to chest pain syndrome.  Patient reports some midepigastric tenderness/burning sensation associated with feeling DC and is slightly short of breath while having breakfast.  There was no clear dyspnea on exertion, orthopnea, radiation of pain to her jaw or left arm.  Patient denies palpitations, syncope, any other associated symptoms to her pain; fever, sick contacts or any other complaints.   Patient reports to be compliant with medication.   In the ED initial troponin was 80; EKG demonstrating sinus bradycardia with unchanged left bundle branch block; patient received aspirin and cardiology service was consulted.  Plan is to admit patient for further chest pain rule out work-up.  Assessment and  Plan: * Chest pain due to coronary artery disease ([place]) -Patient with significant risk factors and a heart score of 5 -Negative troponin and no acute ischemic changes on telemetry or EKG -Nuclear stress test with evidence of prior inferior infarct, apical infarct with mild ischemia.  Intermediate risk mainly due to decrease left ventricle ejection fraction, and prior mild ischemia.  No further inpatient work-up anticipated.  Plan is to continue medical management.   -Continue aspirin, beta-blocker, statins and newly added 15 mg of Imdur daily. -Appreciate cardiology service recommendations and assistance; patient will follow-up with cardiology at discharge.   HLD (hyperlipidemia) -LDL in the 130s range -Will continue Lipitor -Patient advised to follow heart healthy diet. -Repeat LFTs and lipid panel in 8-12 weeks  COPD (chronic obstructive pulmonary disease) (HCC) -No wheezing or acute complaints of shortness of breath. -Continue as needed bronchodilators and resumption of Telegy Ellipta. -Continue outpatient follow-up with pulmonologist.  GERD (gastroesophageal reflux disease) -Continue PPI -Lifestyle changes discussed with patient.  Anxiety -Continue as needed Tranxene -Mood overall stable.  Essential hypertension -Stable and well-controlled -Continue current antihypertensive regimen -Follow vital signs and continue heart healthy diet..  Chronic combined systolic and diastolic CHF (congestive heart failure) (HCC) -Appears to be stable and compensated -Continue to follow daily weights and low-sodium diet. -Continue the use of Coreg and Entresto. -Continue outpatient follow-up with cardiology service.    Consultants: GI service Procedures performed: See below for x-ray report; nuclear stress test. Disposition: Home health Diet recommendation:  Discharge Diet Orders (From admission, onward)     Start     Ordered   07/14/21 0000  Diet - low sodium heart healthy         07/14/21 1252  Cardiac diet  DISCHARGE MEDICATION: Allergies as of 07/14/2021       Reactions   Plavix [clopidogrel Bisulfate] Itching   Severe itching   Alprazolam Nausea And Vomiting   Codeine Nausea And Vomiting   Percodan [oxycodone-aspirin] Nausea And Vomiting   Valium Nausea And Vomiting        Medication List     STOP taking these medications    spironolactone 25 MG tablet Commonly known as: ALDACTONE   triamcinolone cream 0.1 % Commonly known as: KENALOG       TAKE these medications    albuterol (2.5 MG/3ML) 0.083% nebulizer solution Commonly known as: PROVENTIL Take 3 mLs (2.5 mg total) by nebulization every 6 (six) hours as needed for wheezing or shortness of breath.   albuterol 108 (90 Base) MCG/ACT inhaler Commonly known as: Ventolin HFA Inhale 2 puffs into the lungs every 4 (four) hours as needed for wheezing or shortness of breath.   aspirin EC 81 MG tablet Take 81 mg by mouth every morning.   atorvastatin 40 MG tablet Commonly known as: LIPITOR Take 1 tablet (40 mg total) by mouth daily. Start taking on: July 15, 2021   carvedilol 6.25 MG tablet Commonly known as: COREG Take 1 tablet (6.25 mg total) by mouth 2 (two) times daily with a meal. What changed:  medication strength See the new instructions.   clorazepate 7.5 MG tablet Commonly known as: TRANXENE TAKE 1 TABLET BY MOUTH ONCE A DAY AS NEEDED FOR ANXIETY.   Cranberry 500 MG Tabs Take 1 tablet by mouth daily.   Entresto 24-26 MG Generic drug: sacubitril-valsartan TAKE 1 TABLET BY MOUTH TWICE DAILY.   isosorbide mononitrate 30 MG 24 hr tablet Commonly known as: IMDUR Take 0.5 tablets (15 mg total) by mouth daily. Start taking on: July 15, 2021   meclizine 25 MG tablet Commonly known as: ANTIVERT Take 1 tablet (25 mg total) by mouth as needed for dizziness.   miconazole 2 % cream Commonly known as: MICOTIN Apply 1 application topically 2 (two) times daily.    nitroGLYCERIN 0.4 MG SL tablet Commonly known as: NITROSTAT Place 1 tablet (0.4 mg total) under the tongue every 5 (five) minutes x 3 doses as needed for chest pain.   OXYGEN Inhale 2 L into the lungs at bedtime.   pantoprazole 40 MG tablet Commonly known as: PROTONIX Take 1 tablet (40 mg total) by mouth daily. Start taking on: July 15, 2021   traMADol 50 MG tablet Commonly known as: ULTRAM TAKE ONE TABLET BY MOUTH ONCE DAILY AS NEEDED. What changed: See the new instructions.   Trelegy Ellipta 100-62.5-25 MCG/ACT Aepb Generic drug: Fluticasone-Umeclidin-Vilant Inhale 1 puff into the lungs daily.   vitamin C 100 MG tablet Take 100 mg by mouth daily.   Vitamin D3 10 MCG (400 UNIT) Caps Take 1 capsule by mouth daily.        Follow-up Information     Fayrene Helper, MD. Schedule an appointment as soon as possible for a visit in 10 day(s).   Specialty: Family Medicine Contact information: [address] [place] [address] [phone]         Josue Hector, MD .   Specialty: Cardiology Contact information: [phone] [place]  [address] [facility].   Specialty: Home Health Services Why: RN will cal to schedule you first home visit.  Discharge Exam: Filed Weights   07/13/21 1246 07/14/21 0500  Weight: 82 kg 80.8 kg   General exam: Alert, awake, oriented x 3; denies chest pain, shortness of breath and palpitations. Respiratory system: Clear to auscultation. Respiratory effort normal.  Good saturation on room air. Cardiovascular system: Rate controlled, no rubs, no gallops, no JVD on exam. Gastrointestinal system: Abdomen is nondistended, soft and nontender. No organomegaly or masses felt. Normal bowel sounds heard. Central nervous system: Alert and oriented. No focal neurological deficits. Extremities: No cyanosis or clubbing. Skin: No  petechiae. Psychiatry: Judgement and insight appear normal. Mood & affect appropriate.    Condition at discharge: Stable and improved.  The results of significant diagnostics from this hospitalization (including imaging, microbiology, ancillary and laboratory) are listed below for reference.   Imaging Studies: NM Myocar Multi W/Spect W/Wall Motion / EF  Result Date: 07/14/2021   Findings are consistent with prior inferior myocardial infarction. Evidence of prior apical myocardial infarction with mild peri-infarct ischemia. The study is intermediate risk. Risk based primarily on decreased LVEF, there is fairly mild current ischemia.   No ST deviation was noted.   LV perfusion is abnormal.   Left ventricular function is abnormal. Nuclear stress EF: 36 %. The left ventricular ejection fraction is moderately decreased (30-44%).   DG Chest Portable 1 View  Result Date: 07/13/2021 CLINICAL DATA:  Chest pain since this morning EXAM: PORTABLE CHEST 1 VIEW COMPARISON:  Chest radiograph 10/08/2020 FINDINGS: The heart is enlarged, unchanged. The upper mediastinal contours are normal. There is no focal consolidation or pulmonary edema. There is no pleural effusion or pneumothorax There is no acute osseous abnormality. Postsurgical changes are noted in the left humerus. IMPRESSION: Unchanged cardiomegaly. No radiographic evidence of acute cardiopulmonary process. Electronically Signed   By: Valetta Mole M.D.   On: 07/13/2021 13:15    Microbiology: Results for orders placed or performed during the hospital encounter of 07/13/21  Resp Panel by RT-PCR (Flu A&B, Covid) Nasopharyngeal Swab     Status: None   Collection Time: 07/13/21  4:20 PM   Specimen: Nasopharyngeal Swab; Nasopharyngeal(NP) swabs in vial transport medium  Result Value Ref Range Status   SARS Coronavirus 2 by RT PCR NEGATIVE NEGATIVE Final    Comment: (NOTE) SARS-CoV-2 target nucleic acids are NOT DETECTED.  The SARS-CoV-2 RNA is generally  detectable in upper respiratory specimens during the acute phase of infection. The lowest concentration of SARS-CoV-2 viral copies this assay can detect is 138 copies/mL. A negative result does not preclude SARS-Cov-2 infection and should not be used as the sole basis for treatment or other patient management decisions. A negative result may occur with  improper specimen collection/handling, submission of specimen other than nasopharyngeal swab, presence of viral mutation(s) within the areas targeted by this assay, and inadequate number of viral copies(<138 copies/mL). A negative result must be combined with clinical observations, patient history, and epidemiological information. The expected result is Negative.  Fact Sheet for Patients:  EntrepreneurPulse.com.au  Fact Sheet for Healthcare Providers:  IncredibleEmployment.be  This test is no t yet approved or cleared by the [place] FDA and  has been authorized for detection and/or diagnosis of SARS-CoV-2 by FDA under an Emergency Use Authorization (EUA). This EUA will remain  in effect (meaning this test can be used) for the duration of the COVID-19 declaration under Section 564(b)(1) of the Act, 21 U.S.C.section 360bbb-3(b)(1), unless the authorization is terminated  or revoked sooner.       Influenza A  by PCR NEGATIVE NEGATIVE Final   Influenza B by PCR NEGATIVE NEGATIVE Final    Comment: (NOTE) The Xpert Xpress SARS-CoV-2/FLU/RSV plus assay is intended as an aid in the diagnosis of influenza from Nasopharyngeal swab specimens and should not be used as a sole basis for treatment. Nasal washings and aspirates are unacceptable for Xpert Xpress SARS-CoV-2/FLU/RSV testing.  Fact Sheet for Patients: EntrepreneurPulse.com.au  Fact Sheet for Healthcare Providers: IncredibleEmployment.be  This test is not yet approved or cleared by the [place] FDA  and has been authorized for detection and/or diagnosis of SARS-CoV-2 by FDA under an Emergency Use Authorization (EUA). This EUA will remain in effect (meaning this test can be used) for the duration of the COVID-19 declaration under Section 564(b)(1) of the Act, 21 U.S.C. section 360bbb-3(b)(1), unless the authorization is terminated or revoked.  Performed at [facility], [address] [place], [place] [address]     Labs: CBC: Recent Labs  Lab 07/13/21 1306 07/14/21 0519  WBC 7.7 6.2  HGB 10.8* 11.3*  HCT 34.1* 35.9*  MCV 83.0 83.1  PLT 306 327   Basic Metabolic Panel: Recent Labs  Lab 07/13/21 1306 07/13/21 1459 07/14/21 0519  NA 136  --  139  K 4.1  --  4.2  CL 103  --  106  CO2 26  --  26  GLUCOSE 94  --  98  BUN 14  --  13  CREATININE 0.71  --  0.63  CALCIUM 9.3  --  9.5  MG  --  2.4  --   PHOS  --  3.6  --    CBG: Recent Labs  Lab 07/13/21 1321  GLUCAP 97    Discharge time spent: greater than 30 minutes.  Signed: Barton Dubois, MD [facility] 07/14/2021

## 2021-07-14 NOTE — Progress Notes (Addendum)
? ?Progress Note ? ?Patient Name: [name] ?Date of Encounter: 07/14/2021 ? ?[facility] Cardiologist: Kate Sable, MD (Inactive)  ? ?Subjective  ? ?Had some fluttering in her chest overnight, no more chest pain ?Mulitple chronic MS issues, PCP told her not a surgical candidate. ? ?Inpatient Medications  ?  ?Scheduled Meds: ? aspirin  324 mg Oral Once  ? aspirin EC  81 mg Oral BH-q7a  ? atorvastatin  40 mg Oral Daily  ? carvedilol  6.25 mg Oral BID WC  ? fluticasone furoate-vilanterol  1 puff Inhalation Daily  ? And  ? umeclidinium bromide  1 puff Inhalation Daily  ? heparin injection (subcutaneous)  5,000 Units Subcutaneous Q8H  ? pantoprazole  40 mg Oral Daily  ? sacubitril-valsartan  1 tablet Oral BID  ? sodium chloride flush  3 mL Intravenous Q12H  ? ?Continuous Infusions: ? sodium chloride    ? ?PRN Meds: ?sodium chloride, acetaminophen **OR** acetaminophen, albuterol, clorazepate, morphine injection, nitroGLYCERIN, ondansetron (ZOFRAN) IV, sodium chloride flush, technetium tetrofosmin  ? ?Vital Signs  ?  ?Vitals:  ? 07/13/21 1826 07/13/21 2055 07/14/21 0451 07/14/21 0500  ?BP: 130/75 (!) 104/54 (!) 121/50   ?Pulse: 80 72 71   ?Resp: 16 18 18    ?Temp: 99.1 ?F (37.3 ?C) 99.1 ?F (37.3 ?C) 97.9 ?F (36.6 ?C)   ?TempSrc: Oral     ?SpO2: 99% 96% 97%   ?Weight:    80.8 kg  ?Height:      ? ? ?Intake/Output Summary (Last 24 hours) at 07/14/2021 0808 ?Last data filed at 07/14/2021 0500 ?Gross per 24 hour  ?Intake 642.08 ml  ?Output --  ?Net 642.08 ml  ? ?Last 3 Weights 07/14/2021 07/13/2021 04/24/2021  ?Weight (lbs) 178 lb 2.1 oz 180 lb 12.4 oz 178 lb  ?Weight (kg) 80.8 kg 82 kg 80.74 kg  ?   ? ?Telemetry  ?  ?SR, LBBB, frequent PVCs and pairs - Personally Reviewed ? ?ECG  ?  ?03/02, SR, HR 58, LBBB w/ QRS duration 172 ms - Personally Reviewed ? ?Physical Exam  ? ?GEN: No acute distress.   ?Neck: No JVD ?Cardiac: RRR, no murmurs, rubs, or gallops.  ?Respiratory: Clear to auscultation bilaterally. ?GI: Soft,  nontender, non-distended  ?MS: No edema; No deformity. ?Neuro:  Nonfocal  ?Psych: Normal affect  ? ?Labs  ?  ?High Sensitivity Troponin:   ?Recent Labs  ?Lab 07/13/21 ?1306 07/13/21 ?1919 07/13/21 ?2144  ?TROPONINIHS 8 8 9   ?   ?Chemistry ?Recent Labs  ?Lab 07/13/21 ?1306 07/13/21 ?1459 07/14/21 ?7654  ?NA 136  --  139  ?K 4.1  --  4.2  ?CL 103  --  106  ?CO2 26  --  26  ?GLUCOSE 94  --  98  ?BUN 14  --  13  ?CREATININE 0.71  --  0.63  ?CALCIUM 9.3  --  9.5  ?MG  --  2.4  --   ?GFRNONAA >60  --  >60  ?ANIONGAP 7  --  7  ?  ?Lipids  ?Recent Labs  ?Lab 07/13/21 ?1502  ?CHOL 216*  ?TRIG 62  ?HDL 71  ?LDLCALC 133*  ?CHOLHDL 3.0  ?  ?Hematology ?Recent Labs  ?Lab 07/13/21 ?1306 07/14/21 ?6503  ?WBC 7.7 6.2  ?RBC 4.11 4.32  ?HGB 10.8* 11.3*  ?HCT 34.1* 35.9*  ?MCV 83.0 83.1  ?MCH 26.3 26.2  ?MCHC 31.7 31.5  ?RDW 15.7* 15.7*  ?PLT 306 316  ? ?Thyroid  ?Recent Labs  ?Lab  07/13/21 ?1459  ?TSH 1.432  ?  ?BNPNo results for input(s): BNP, PROBNP in the last 168 hours.  ?DDimer No results for input(s): DDIMER in the last 168 hours.  ? ?Radiology  ?  ?DG Chest Portable 1 View ? ?Result Date: 07/13/2021 ?CLINICAL DATA:  Chest pain since this morning EXAM: PORTABLE CHEST 1 VIEW COMPARISON:  Chest radiograph 10/08/2020 FINDINGS: The heart is enlarged, unchanged. The upper mediastinal contours are normal. There is no focal consolidation or pulmonary edema. There is no pleural effusion or pneumothorax There is no acute osseous abnormality. Postsurgical changes are noted in the left humerus. IMPRESSION: Unchanged cardiomegaly. No radiographic evidence of acute cardiopulmonary process. Electronically Signed   By: Valetta Mole M.D.   On: 07/13/2021 13:15   ? ?Cardiac Studies  ? ?MYOVIEW: pending ? ?ECHO: 05/26/2021 ? 1. Left ventricular ejection fraction, by estimation, is 35 to 40%. The  ?left ventricle has moderately decreased function. The left ventricle  ?demonstrates global hypokinesis. There is moderate left ventricular   ?hypertrophy. Left ventricular diastolic  ?parameters are consistent with Grade I diastolic dysfunction (impaired  ?relaxation).  ? 2. Right ventricular systolic function is normal. The right ventricular  ?size is normal. Tricuspid regurgitation signal is inadequate for assessing  ?PA pressure.  ? 3. Left atrial size was mildly dilated.  ? 4. Moderate pericardial effusion. The pericardial effusion is anterior to  ?the right ventricle.  ? 5. The mitral valve is grossly normal. Mild mitral valve regurgitation.  ? 6. The aortic valve was not well visualized. Aortic valve regurgitation  ?is mild. Aortic valve sclerosis/calcification is present, without any  ?evidence of aortic stenosis. Aortic valve mean gradient measures 5.0 mmHg.  ? 7. The inferior vena cava is dilated in size with >50% respiratory  ?variability, suggesting right atrial pressure of 8 mmHg.  ? ?Comparison(s): Prior images reviewed side by side. LVEF has decreased  ?somewhat in comparison. Anterior pericardial effusion present on prior  ?stufy as well, somewhat larger but without obvious hemodynamic  ?significance.  ? ?CARDIAC CATH: 2018 s/p DES LAD & OM1 ?Intervention ? ? ? ?Patient Profile  ?   ?86 y.o. female w/ hx DES LAD & OM1 2018, HTN, HLD, MI, HFrEP, COPD, was admitted 03/02 with chest pain. ? ?Assessment & Plan  ?  ?Chest pain ?- Ez neg MI ?- MV pending ?- sx have resolved ?- drinking cold drinks can give her CP, unclear if that was the cause yesterday ? ?2. ?bradycardia ?- at the time of her CP, her pulse-ox registered a HR of 36, O2 83% ?- has heart fluttering at times ?- PVCs may be the cause of the fluttering and cause her HR to appear low ?- no actual bradycardia seen ? ?3. HLD ?- lipid profile above ?- no statins are listed on allergies, none on home med list ?- MD to review ? ?4. Chronic systolic CHF ?- EF is stable at 35-40% ?- no volume overload by exam ?- continue Coreg 12.5 mg bid, Entresto 24-26 mg bid, prn spiro 25 mg  ? ? ?For  questions or updates, please contact [place] ?Please consult [email or web address] for contact info under  ? ?  ?   ?Signed, ?Rosaria Ferries, PA-C  ?07/14/2021, 8:08 AM   ? ?Attending note ?Patient seen and discussed with PA Barrett, I agree with her documentation. 86 yo female history of CAD as reported above presents with chest pain. No objective evidence of ischemia by enzymes, she has chronic LBBB on EKG.  Recent echo Jan 2023 LVEF 35-40%, has been historically in the 40% range. Nuclear stress test with evidence of prior inferior infarct, apical infarct with mild ischemia. Intermediate risk mainly due to decreased LVEF, mild current ischemia. Would plan for ongoing medical therapy at this time, would add imdur 15mg  daily and monitor symptoms over time No additional inpatient workup planned at this time. ? ? ?Carlyle Dolly MD ? ?

## 2021-07-14 NOTE — Progress Notes (Signed)
? ?  [name] presented for a nuclear stress test today.  No immediate complications.  Stress imaging is pending at this time. ? ?Preliminary EKG findings may be listed in the chart, but the stress test result will not be finalized until perfusion imaging is complete. ? ?1 day study, Dr Harl Bowie to read. ? ?Rosaria Ferries, PA-C ?07/14/2021, 10:40 AM  ? ?

## 2021-07-15 DIAGNOSIS — I110 Hypertensive heart disease with heart failure: Secondary | ICD-10-CM | POA: Diagnosis not present

## 2021-07-15 DIAGNOSIS — I25119 Atherosclerotic heart disease of native coronary artery with unspecified angina pectoris: Secondary | ICD-10-CM | POA: Diagnosis not present

## 2021-07-15 DIAGNOSIS — K769 Liver disease, unspecified: Secondary | ICD-10-CM | POA: Diagnosis not present

## 2021-07-15 DIAGNOSIS — E785 Hyperlipidemia, unspecified: Secondary | ICD-10-CM | POA: Diagnosis not present

## 2021-07-15 DIAGNOSIS — Z7951 Long term (current) use of inhaled steroids: Secondary | ICD-10-CM | POA: Diagnosis not present

## 2021-07-15 DIAGNOSIS — M5000 Cervical disc disorder with myelopathy, unspecified cervical region: Secondary | ICD-10-CM | POA: Diagnosis not present

## 2021-07-15 DIAGNOSIS — Z9981 Dependence on supplemental oxygen: Secondary | ICD-10-CM | POA: Diagnosis not present

## 2021-07-15 DIAGNOSIS — Z7982 Long term (current) use of aspirin: Secondary | ICD-10-CM | POA: Diagnosis not present

## 2021-07-15 DIAGNOSIS — I712 Thoracic aortic aneurysm, without rupture, unspecified: Secondary | ICD-10-CM | POA: Diagnosis not present

## 2021-07-15 DIAGNOSIS — F419 Anxiety disorder, unspecified: Secondary | ICD-10-CM | POA: Diagnosis not present

## 2021-07-15 DIAGNOSIS — M1990 Unspecified osteoarthritis, unspecified site: Secondary | ICD-10-CM | POA: Diagnosis not present

## 2021-07-15 DIAGNOSIS — K219 Gastro-esophageal reflux disease without esophagitis: Secondary | ICD-10-CM | POA: Diagnosis not present

## 2021-07-15 DIAGNOSIS — I255 Ischemic cardiomyopathy: Secondary | ICD-10-CM | POA: Diagnosis not present

## 2021-07-15 DIAGNOSIS — J9621 Acute and chronic respiratory failure with hypoxia: Secondary | ICD-10-CM | POA: Diagnosis not present

## 2021-07-15 DIAGNOSIS — Z87891 Personal history of nicotine dependence: Secondary | ICD-10-CM | POA: Diagnosis not present

## 2021-07-15 DIAGNOSIS — I5043 Acute on chronic combined systolic (congestive) and diastolic (congestive) heart failure: Secondary | ICD-10-CM | POA: Diagnosis not present

## 2021-07-15 DIAGNOSIS — Z79891 Long term (current) use of opiate analgesic: Secondary | ICD-10-CM | POA: Diagnosis not present

## 2021-07-15 DIAGNOSIS — J449 Chronic obstructive pulmonary disease, unspecified: Secondary | ICD-10-CM | POA: Diagnosis not present

## 2021-07-15 DIAGNOSIS — J984 Other disorders of lung: Secondary | ICD-10-CM | POA: Diagnosis not present

## 2021-07-20 DIAGNOSIS — I110 Hypertensive heart disease with heart failure: Secondary | ICD-10-CM | POA: Diagnosis not present

## 2021-07-20 DIAGNOSIS — I255 Ischemic cardiomyopathy: Secondary | ICD-10-CM | POA: Diagnosis not present

## 2021-07-20 DIAGNOSIS — J449 Chronic obstructive pulmonary disease, unspecified: Secondary | ICD-10-CM | POA: Diagnosis not present

## 2021-07-20 DIAGNOSIS — I5043 Acute on chronic combined systolic (congestive) and diastolic (congestive) heart failure: Secondary | ICD-10-CM | POA: Diagnosis not present

## 2021-07-20 DIAGNOSIS — J9621 Acute and chronic respiratory failure with hypoxia: Secondary | ICD-10-CM | POA: Diagnosis not present

## 2021-07-20 DIAGNOSIS — I25119 Atherosclerotic heart disease of native coronary artery with unspecified angina pectoris: Secondary | ICD-10-CM | POA: Diagnosis not present

## 2021-07-29 DIAGNOSIS — I5043 Acute on chronic combined systolic (congestive) and diastolic (congestive) heart failure: Secondary | ICD-10-CM | POA: Diagnosis not present

## 2021-07-29 DIAGNOSIS — I255 Ischemic cardiomyopathy: Secondary | ICD-10-CM | POA: Diagnosis not present

## 2021-07-29 DIAGNOSIS — J449 Chronic obstructive pulmonary disease, unspecified: Secondary | ICD-10-CM | POA: Diagnosis not present

## 2021-07-29 DIAGNOSIS — J9621 Acute and chronic respiratory failure with hypoxia: Secondary | ICD-10-CM | POA: Diagnosis not present

## 2021-07-29 DIAGNOSIS — I110 Hypertensive heart disease with heart failure: Secondary | ICD-10-CM | POA: Diagnosis not present

## 2021-07-29 DIAGNOSIS — I25119 Atherosclerotic heart disease of native coronary artery with unspecified angina pectoris: Secondary | ICD-10-CM | POA: Diagnosis not present

## 2021-07-31 DIAGNOSIS — Z20822 Contact with and (suspected) exposure to covid-19: Secondary | ICD-10-CM | POA: Diagnosis not present

## 2021-08-01 DIAGNOSIS — Z20822 Contact with and (suspected) exposure to covid-19: Secondary | ICD-10-CM | POA: Diagnosis not present

## 2021-08-03 DIAGNOSIS — I5043 Acute on chronic combined systolic (congestive) and diastolic (congestive) heart failure: Secondary | ICD-10-CM | POA: Diagnosis not present

## 2021-08-03 DIAGNOSIS — I255 Ischemic cardiomyopathy: Secondary | ICD-10-CM | POA: Diagnosis not present

## 2021-08-03 DIAGNOSIS — J449 Chronic obstructive pulmonary disease, unspecified: Secondary | ICD-10-CM | POA: Diagnosis not present

## 2021-08-03 DIAGNOSIS — I25119 Atherosclerotic heart disease of native coronary artery with unspecified angina pectoris: Secondary | ICD-10-CM | POA: Diagnosis not present

## 2021-08-03 DIAGNOSIS — I110 Hypertensive heart disease with heart failure: Secondary | ICD-10-CM | POA: Diagnosis not present

## 2021-08-03 DIAGNOSIS — J9621 Acute and chronic respiratory failure with hypoxia: Secondary | ICD-10-CM | POA: Diagnosis not present

## 2021-08-04 DIAGNOSIS — Z20822 Contact with and (suspected) exposure to covid-19: Secondary | ICD-10-CM | POA: Diagnosis not present

## 2021-08-10 DIAGNOSIS — J9621 Acute and chronic respiratory failure with hypoxia: Secondary | ICD-10-CM | POA: Diagnosis not present

## 2021-08-10 DIAGNOSIS — I5043 Acute on chronic combined systolic (congestive) and diastolic (congestive) heart failure: Secondary | ICD-10-CM | POA: Diagnosis not present

## 2021-08-10 DIAGNOSIS — I25119 Atherosclerotic heart disease of native coronary artery with unspecified angina pectoris: Secondary | ICD-10-CM | POA: Diagnosis not present

## 2021-08-10 DIAGNOSIS — I110 Hypertensive heart disease with heart failure: Secondary | ICD-10-CM | POA: Diagnosis not present

## 2021-08-10 DIAGNOSIS — I255 Ischemic cardiomyopathy: Secondary | ICD-10-CM | POA: Diagnosis not present

## 2021-08-10 DIAGNOSIS — J449 Chronic obstructive pulmonary disease, unspecified: Secondary | ICD-10-CM | POA: Diagnosis not present

## 2021-08-14 ENCOUNTER — Other Ambulatory Visit: Payer: Self-pay | Admitting: Cardiology

## 2021-08-14 DIAGNOSIS — I5043 Acute on chronic combined systolic (congestive) and diastolic (congestive) heart failure: Secondary | ICD-10-CM | POA: Diagnosis not present

## 2021-08-14 DIAGNOSIS — M1990 Unspecified osteoarthritis, unspecified site: Secondary | ICD-10-CM | POA: Diagnosis not present

## 2021-08-14 DIAGNOSIS — Z7982 Long term (current) use of aspirin: Secondary | ICD-10-CM | POA: Diagnosis not present

## 2021-08-14 DIAGNOSIS — I712 Thoracic aortic aneurysm, without rupture, unspecified: Secondary | ICD-10-CM | POA: Diagnosis not present

## 2021-08-14 DIAGNOSIS — I255 Ischemic cardiomyopathy: Secondary | ICD-10-CM | POA: Diagnosis not present

## 2021-08-14 DIAGNOSIS — J449 Chronic obstructive pulmonary disease, unspecified: Secondary | ICD-10-CM | POA: Diagnosis not present

## 2021-08-14 DIAGNOSIS — Z87891 Personal history of nicotine dependence: Secondary | ICD-10-CM | POA: Diagnosis not present

## 2021-08-14 DIAGNOSIS — Z9981 Dependence on supplemental oxygen: Secondary | ICD-10-CM | POA: Diagnosis not present

## 2021-08-14 DIAGNOSIS — I110 Hypertensive heart disease with heart failure: Secondary | ICD-10-CM | POA: Diagnosis not present

## 2021-08-14 DIAGNOSIS — K219 Gastro-esophageal reflux disease without esophagitis: Secondary | ICD-10-CM | POA: Diagnosis not present

## 2021-08-14 DIAGNOSIS — J9621 Acute and chronic respiratory failure with hypoxia: Secondary | ICD-10-CM | POA: Diagnosis not present

## 2021-08-14 DIAGNOSIS — Z7951 Long term (current) use of inhaled steroids: Secondary | ICD-10-CM | POA: Diagnosis not present

## 2021-08-14 DIAGNOSIS — J984 Other disorders of lung: Secondary | ICD-10-CM | POA: Diagnosis not present

## 2021-08-14 DIAGNOSIS — F419 Anxiety disorder, unspecified: Secondary | ICD-10-CM | POA: Diagnosis not present

## 2021-08-14 DIAGNOSIS — M5000 Cervical disc disorder with myelopathy, unspecified cervical region: Secondary | ICD-10-CM | POA: Diagnosis not present

## 2021-08-14 DIAGNOSIS — E785 Hyperlipidemia, unspecified: Secondary | ICD-10-CM | POA: Diagnosis not present

## 2021-08-14 DIAGNOSIS — I25119 Atherosclerotic heart disease of native coronary artery with unspecified angina pectoris: Secondary | ICD-10-CM | POA: Diagnosis not present

## 2021-08-14 DIAGNOSIS — Z79891 Long term (current) use of opiate analgesic: Secondary | ICD-10-CM | POA: Diagnosis not present

## 2021-08-14 DIAGNOSIS — K769 Liver disease, unspecified: Secondary | ICD-10-CM | POA: Diagnosis not present

## 2021-08-16 DIAGNOSIS — J449 Chronic obstructive pulmonary disease, unspecified: Secondary | ICD-10-CM | POA: Diagnosis not present

## 2021-08-16 DIAGNOSIS — J9621 Acute and chronic respiratory failure with hypoxia: Secondary | ICD-10-CM | POA: Diagnosis not present

## 2021-08-16 DIAGNOSIS — I5043 Acute on chronic combined systolic (congestive) and diastolic (congestive) heart failure: Secondary | ICD-10-CM | POA: Diagnosis not present

## 2021-08-16 DIAGNOSIS — I110 Hypertensive heart disease with heart failure: Secondary | ICD-10-CM | POA: Diagnosis not present

## 2021-08-16 DIAGNOSIS — I25119 Atherosclerotic heart disease of native coronary artery with unspecified angina pectoris: Secondary | ICD-10-CM | POA: Diagnosis not present

## 2021-08-16 DIAGNOSIS — I255 Ischemic cardiomyopathy: Secondary | ICD-10-CM | POA: Diagnosis not present

## 2021-08-17 NOTE — Progress Notes (Signed)
? ?Cardiology Office Note   ? ?Date:  08/25/2021  ? ?ID:  [name], DOB June 27, 1935, MRN [id number] ? ?PCP:  No primary care provider on file.  ?Cardiologist: Jenkins Rouge, MD   ? ? ? ?History of Present Illness:   ? ?[name] is a 86 y.o. female with past medical history of CAD (s/p DES to mid-LAD and DES to mid-OM1 in 40/8144), chronic systolic CHF/ICM (EF 81-85% in 2018, at 45% by repeat echo in 12/2018), HTN, HLD, RA, dilation of ascending aorta (at 4.2 cm by imaging in 2019) and COPD who presents to the office today post hospital 07/13/21  ? ?She had vertigo with dizziness with burning GERD like pain She wears oxygen at night and noted low pulse with low sats  R/O ECG with SB old LBBB Myovue with old inferior infarct and apical infarct no ischemia EF estimated at 36% Dr Harl Bowie added imdur and felt medical Rx indicated since symptoms resolved and enzymes negative  ? ?Still living alone but should likely be in assisted living  Has son in [place] and daughter nearby that check on her  ? ?She is debilitated from arthritis in shoulders and knees has had right shoulder replacement  ? ?Past Medical History:  ?Diagnosis Date  ? Acute on chronic respiratory failure with hypoxia ([place]) 04/24/2018  ? Anxiety   ? Atypical chest pain   ? Bronchospasm   ? CAD (coronary artery disease)   ? a. s/p DES to mid-LAD and DES to mid-OM1 in 11/2016  ? Cervical disc disorder with myelopathy, unspecified cervical region   ? Cervicalgia 01/19/2009  ? Qualifier: Diagnosis of  By: Aline Brochure MD, Dorothyann Peng    ? Chest pain at rest 01/27/2015  ? Chronic systolic (congestive) heart failure (HCC)   ? COPD (chronic obstructive pulmonary disease) ([place])   ? Coronary artery disease   ? De Quervain's disease (tenosynovitis) 04/02/2011  ? Disc disease with myelopathy, cervical   ? Essential hypertension   ? Hemorrhoids   ? Liver mass   ? Lung, cysts, congenital   ? Left lung cyst  ? Myocardial infarction [facility])   ? Nephrolithiasis   ? Embedded  ?  Nonischemic cardiomyopathy ([place])   ? LVEF 35-40% 2015  ? On home O2   ? Osteoarthritis   ? Sprain of wrist 08/07/2012  ? Thoracic ascending aortic aneurysm ([place])   ? 4.3 cm April 2016  ? ? ?Past Surgical History:  ?Procedure Laterality Date  ? Benign breast tumors    ? CHOLECYSTECTOMY    ? COLONOSCOPY    ? COLONOSCOPY N/A 09/22/2014  ? Procedure: COLONOSCOPY;  Surgeon: Rogene Houston, MD;  [facility];  Service: Endoscopy;  Laterality: N/A;  830 -- to be done in OR under fluoro  ? Complete hysterectomy    ? CORONARY STENT INTERVENTION N/A 11/23/2016  ? Procedure: Coronary Stent Intervention;  Surgeon: Martinique, Rajiv Parlato M, MD;  Location: [place] [facility];  Service: Cardiovascular;  Laterality: N/A;  ? LEFT HEART CATH AND CORONARY ANGIOGRAPHY N/A 11/23/2016  ? Procedure: Left Heart Cath and Coronary Angiography;  Surgeon: Martinique, Lindia Garms M, MD;  Location: [place] [facility];  Service: Cardiovascular;  Laterality: N/A;  ? TONSILLECTOMY AND ADENOIDECTOMY    ? ? ?Current Medications: ?Outpatient Medications Prior to Visit  ?Medication Sig Dispense Refill  ? albuterol (PROVENTIL) (2.5 MG/3ML) 0.083% nebulizer solution Take 3 mLs (2.5 mg total) by nebulization every 6 (six) hours as needed for wheezing  or shortness of breath. 75 mL 12  ? albuterol (VENTOLIN HFA) 108 (90 Base) MCG/ACT inhaler Inhale 2 puffs into the lungs every 4 (four) hours as needed for wheezing or shortness of breath. 18 g 5  ? Ascorbic Acid (VITAMIN C) 100 MG tablet Take 100 mg by mouth daily.    ? aspirin EC 81 MG tablet Take 81 mg by mouth every morning.     ? atorvastatin (LIPITOR) 40 MG tablet Take 1 tablet (40 mg total) by mouth daily. 30 tablet 2  ? carvedilol (COREG) 6.25 MG tablet Take 1 tablet (6.25 mg total) by mouth 2 (two) times daily with a meal. 60 tablet 2  ? Cholecalciferol (VITAMIN D3) 10 MCG (400 UNIT) CAPS Take 1 capsule by mouth daily.    ? clorazepate (TRANXENE) 7.5 MG tablet TAKE 1 TABLET BY MOUTH ONCE A DAY AS NEEDED FOR  ANXIETY. 30 tablet 0  ? Cranberry 500 MG TABS Take 1 tablet by mouth daily.    ? ENTRESTO 24-26 MG TAKE 1 TABLET BY MOUTH TWICE DAILY. 60 tablet 3  ? Fluticasone-Umeclidin-Vilant (TRELEGY ELLIPTA) 100-62.5-25 MCG/INH AEPB Inhale 1 puff into the lungs daily. 60 each 11  ? isosorbide mononitrate (IMDUR) 30 MG 24 hr tablet Take 0.5 tablets (15 mg total) by mouth daily. 30 tablet 2  ? meclizine (ANTIVERT) 25 MG tablet Take 1 tablet (25 mg total) by mouth as needed for dizziness. 30 tablet 11  ? miconazole (MICOTIN) 2 % cream Apply 1 application topically 2 (two) times daily. 28.35 g 0  ? nitroGLYCERIN (NITROSTAT) 0.4 MG SL tablet Place 1 tablet (0.4 mg total) under the tongue every 5 (five) minutes x 3 doses as needed for chest pain. 25 tablet 3  ? OXYGEN Inhale 2 L into the lungs at bedtime.    ? pantoprazole (PROTONIX) 40 MG tablet Take 1 tablet (40 mg total) by mouth daily. 30 tablet 1  ? traMADol (ULTRAM) 50 MG tablet TAKE ONE TABLET BY MOUTH ONCE DAILY AS NEEDED. 15 tablet 0  ? ?No facility-administered medications prior to visit.  ?  ? ?Allergies:   Plavix [clopidogrel bisulfate], Alprazolam, Codeine, Percodan [oxycodone-aspirin], and Valium  ? ?Social History  ? ?Socioeconomic History  ? Marital status: Widowed  ?  Spouse name: Not on file  ? Number of children: Not on file  ? Years of education: 9th  ? Highest education level: Not on file  ?Occupational History  ?  Employer: RETIRED  ?Tobacco Use  ? Smoking status: Former  ?  Packs/day: 0.25  ?  Years: 3.00  ?  Pack years: 0.75  ?  Types: Cigarettes  ?  Start date: 05/14/1977  ?  Quit date: 05/28/2008  ?  Years since quitting: 13.2  ? Smokeless tobacco: Never  ? Tobacco comments:  ?  patient states she only smoked for 3 years total  ?Vaping Use  ? Vaping Use: Never used  ?Substance and Sexual Activity  ? Alcohol use: No  ?  Alcohol/week: 0.0 standard drinks  ? Drug use: No  ? Sexual activity: Not Currently  ?  Birth control/protection: Surgical  ?  Comment: hyst   ?Other Topics Concern  ? Not on file  ?Social History Narrative  ? Not on file  ? ?Social Determinants of Health  ? ?Financial Resource Strain: Low Risk   ? Difficulty of Paying Living Expenses: Not very hard  ?Food Insecurity: No Food Insecurity  ? Worried About Charity fundraiser in the  Last Year: Never true  ? Ran Out of Food in the Last Year: Never true  ?Transportation Needs: No Transportation Needs  ? Lack of Transportation (Medical): No  ? Lack of Transportation (Non-Medical): No  ?Physical Activity: Inactive  ? Days of Exercise per Week: 0 days  ? Minutes of Exercise per Session: 0 min  ?Stress: Stress Concern Present  ? Feeling of Stress : Very much  ?Social Connections: Socially Isolated  ? Frequency of Communication with Friends and Family: Three times a week  ? Frequency of Social Gatherings with Friends and Family: Twice a week  ? Attends Religious Services: Never  ? Active Member of Clubs or Organizations: No  ? Attends Archivist Meetings: Never  ? Marital Status: Widowed  ?  ? ?Family History:  The patient's family history includes Aneurysm in her father; Diabetes in her brother; Heart disease in her brother, mother, and sister; Lung cancer in her brother.  ? ?Review of Systems:   ?Please see the history of present illness.    ? ?General:  No chills, fever, night sweats or weight changes.  ?Cardiovascular:  No chest pain, edema, orthopnea, palpitations, paroxysmal nocturnal dyspnea. Positive for dyspnea on exertion.  ?Dermatological: No rash, lesions/masses ?Respiratory: No cough, dyspnea ?Urologic: No hematuria, dysuria ?Abdominal:   No nausea, vomiting, diarrhea, bright red blood per rectum, melena, or hematemesis ?Neurologic:  No visual changes, wkns, changes in mental status. ?All other systems reviewed and are otherwise negative except as noted above. ? ? ?Physical Exam:   ? ?VS:  BP 112/60 (BP Location: Left Arm, Patient Position: Sitting, Cuff Size: Normal)   Pulse 76   Ht '5\' 2"'$   (1.575 m)   Wt 180 lb (81.6 kg)   SpO2 95%   BMI 32.92 kg/m?    ? ?Affect appropriate ?Healthy:  appears stated age ?HEENT: normal ?Neck supple with no adenopathy ?JVP normal no bruits no thyromegaly ?Lungs clear wit

## 2021-08-23 DIAGNOSIS — J9621 Acute and chronic respiratory failure with hypoxia: Secondary | ICD-10-CM | POA: Diagnosis not present

## 2021-08-23 DIAGNOSIS — I25119 Atherosclerotic heart disease of native coronary artery with unspecified angina pectoris: Secondary | ICD-10-CM | POA: Diagnosis not present

## 2021-08-23 DIAGNOSIS — I5043 Acute on chronic combined systolic (congestive) and diastolic (congestive) heart failure: Secondary | ICD-10-CM | POA: Diagnosis not present

## 2021-08-23 DIAGNOSIS — I255 Ischemic cardiomyopathy: Secondary | ICD-10-CM | POA: Diagnosis not present

## 2021-08-23 DIAGNOSIS — I110 Hypertensive heart disease with heart failure: Secondary | ICD-10-CM | POA: Diagnosis not present

## 2021-08-23 DIAGNOSIS — J449 Chronic obstructive pulmonary disease, unspecified: Secondary | ICD-10-CM | POA: Diagnosis not present

## 2021-08-25 ENCOUNTER — Ambulatory Visit (INDEPENDENT_AMBULATORY_CARE_PROVIDER_SITE_OTHER): Payer: Medicare Other | Admitting: Cardiovascular Disease

## 2021-08-25 ENCOUNTER — Encounter: Payer: Self-pay | Admitting: Cardiovascular Disease

## 2021-08-25 VITALS — BP 112/60 | HR 76 | Ht 62.0 in | Wt 180.0 lb

## 2021-08-25 DIAGNOSIS — I447 Left bundle-branch block, unspecified: Secondary | ICD-10-CM

## 2021-08-25 DIAGNOSIS — I7121 Aneurysm of the ascending aorta, without rupture: Secondary | ICD-10-CM

## 2021-08-25 DIAGNOSIS — I10 Essential (primary) hypertension: Secondary | ICD-10-CM | POA: Diagnosis not present

## 2021-08-25 DIAGNOSIS — I429 Cardiomyopathy, unspecified: Secondary | ICD-10-CM

## 2021-08-25 DIAGNOSIS — I25118 Atherosclerotic heart disease of native coronary artery with other forms of angina pectoris: Secondary | ICD-10-CM

## 2021-08-25 DIAGNOSIS — E785 Hyperlipidemia, unspecified: Secondary | ICD-10-CM

## 2021-08-25 DIAGNOSIS — I5042 Chronic combined systolic (congestive) and diastolic (congestive) heart failure: Secondary | ICD-10-CM

## 2021-08-25 NOTE — Patient Instructions (Signed)
Medication Instructions:  ?Your physician recommends that you continue on your current medications as directed. Please refer to the Current Medication list given to you today. ? ?*If you need a refill on your cardiac medications before your next appointment, please call your pharmacy* ? ? ?Lab Work: ?NONE  ? ?If you have labs (blood work) drawn today and your tests are completely normal, you will receive your results only by: ?MyChart Message (if you have MyChart) OR ?A paper copy in the mail ?If you have any lab test that is abnormal or we need to change your treatment, we will call you to review the results. ? ? ?Testing/Procedures: ?NONE  ? ? ?Follow-Up: ?At [facility], you and your health needs are our priority.  As part of our continuing mission to provide you with exceptional heart care, we have created designated Provider Care Teams.  These Care Teams include your primary Cardiologist (physician) and Advanced Practice Providers (APPs -  Physician Assistants and Nurse Practitioners) who all work together to provide you with the care you need, when you need it. ? ?We recommend signing up for the patient portal called "MyChart".  Sign up information is provided on this After Visit Summary.  MyChart is used to connect with patients for Virtual Visits (Telemedicine).  Patients are able to view lab/test results, encounter notes, upcoming appointments, etc.  Non-urgent messages can be sent to your provider as well.   ?To learn more about what you can do with MyChart, go to [email or web address].   ? ?Your next appointment:   ?1 year(s) ? ?The format for your next appointment:   ?In Person ? ?Provider:   ?Peter Nishan, MD  ? ? ?Other Instructions ?Thank you for choosing [facility]! ? ? ? ?Important Information About Sugar ? ? ? ? ? ? ?

## 2021-08-28 DIAGNOSIS — Z111 Encounter for screening for respiratory tuberculosis: Secondary | ICD-10-CM | POA: Diagnosis not present

## 2021-08-28 DIAGNOSIS — M0609 Rheumatoid arthritis without rheumatoid factor, multiple sites: Secondary | ICD-10-CM | POA: Diagnosis not present

## 2021-08-28 DIAGNOSIS — R5382 Chronic fatigue, unspecified: Secondary | ICD-10-CM | POA: Diagnosis not present

## 2021-08-28 DIAGNOSIS — G8929 Other chronic pain: Secondary | ICD-10-CM | POA: Diagnosis not present

## 2021-08-28 DIAGNOSIS — Z20822 Contact with and (suspected) exposure to covid-19: Secondary | ICD-10-CM | POA: Diagnosis not present

## 2021-08-28 DIAGNOSIS — M1991 Primary osteoarthritis, unspecified site: Secondary | ICD-10-CM | POA: Diagnosis not present

## 2021-08-28 DIAGNOSIS — E669 Obesity, unspecified: Secondary | ICD-10-CM | POA: Diagnosis not present

## 2021-08-28 DIAGNOSIS — M25562 Pain in left knee: Secondary | ICD-10-CM | POA: Diagnosis not present

## 2021-08-28 DIAGNOSIS — M2550 Pain in unspecified joint: Secondary | ICD-10-CM | POA: Diagnosis not present

## 2021-08-28 DIAGNOSIS — Z6832 Body mass index (BMI) 32.0-32.9, adult: Secondary | ICD-10-CM | POA: Diagnosis not present

## 2021-08-30 DIAGNOSIS — J449 Chronic obstructive pulmonary disease, unspecified: Secondary | ICD-10-CM | POA: Diagnosis not present

## 2021-08-30 DIAGNOSIS — I25119 Atherosclerotic heart disease of native coronary artery with unspecified angina pectoris: Secondary | ICD-10-CM | POA: Diagnosis not present

## 2021-08-30 DIAGNOSIS — I255 Ischemic cardiomyopathy: Secondary | ICD-10-CM | POA: Diagnosis not present

## 2021-08-30 DIAGNOSIS — J9621 Acute and chronic respiratory failure with hypoxia: Secondary | ICD-10-CM | POA: Diagnosis not present

## 2021-08-30 DIAGNOSIS — I5043 Acute on chronic combined systolic (congestive) and diastolic (congestive) heart failure: Secondary | ICD-10-CM | POA: Diagnosis not present

## 2021-08-30 DIAGNOSIS — I110 Hypertensive heart disease with heart failure: Secondary | ICD-10-CM | POA: Diagnosis not present

## 2021-09-01 DIAGNOSIS — Z20822 Contact with and (suspected) exposure to covid-19: Secondary | ICD-10-CM | POA: Diagnosis not present

## 2021-09-11 ENCOUNTER — Ambulatory Visit (INDEPENDENT_AMBULATORY_CARE_PROVIDER_SITE_OTHER): Payer: Medicare Other | Admitting: Family Medicine

## 2021-09-11 ENCOUNTER — Encounter: Payer: Self-pay | Admitting: Family Medicine

## 2021-09-11 VITALS — BP 112/67 | HR 75 | Temp 97.6°F | Ht 62.0 in | Wt 176.4 lb

## 2021-09-11 DIAGNOSIS — J432 Centrilobular emphysema: Secondary | ICD-10-CM

## 2021-09-11 DIAGNOSIS — I25118 Atherosclerotic heart disease of native coronary artery with other forms of angina pectoris: Secondary | ICD-10-CM | POA: Diagnosis not present

## 2021-09-11 DIAGNOSIS — I110 Hypertensive heart disease with heart failure: Secondary | ICD-10-CM

## 2021-09-11 DIAGNOSIS — G894 Chronic pain syndrome: Secondary | ICD-10-CM

## 2021-09-11 DIAGNOSIS — M0609 Rheumatoid arthritis without rheumatoid factor, multiple sites: Secondary | ICD-10-CM | POA: Diagnosis not present

## 2021-09-11 DIAGNOSIS — I5042 Chronic combined systolic (congestive) and diastolic (congestive) heart failure: Secondary | ICD-10-CM | POA: Diagnosis not present

## 2021-09-11 DIAGNOSIS — F419 Anxiety disorder, unspecified: Secondary | ICD-10-CM

## 2021-09-11 DIAGNOSIS — I10 Essential (primary) hypertension: Secondary | ICD-10-CM | POA: Diagnosis not present

## 2021-09-11 NOTE — Progress Notes (Signed)
? ?Subjective:  ?Patient ID: [name], female    DOB: October 21, 1935  Age: 86 y.o. MRN: [id number] ? ?CC: New Patient (Initial Visit) ? ? ?HPI ?[name] presents for heart failure, Seeing Dr. Johnsie Cancel. Using O2 at night only.  ? ?brain tumor- meningioma dx 3 years ago. Causing blalnce, memory issues. ? ? COPD -  dyspnea with cough, severe. Coughed up a parasite. Hasn't been dyspneic since. Continues to use Trelegy daily.  ? ?Has constant pain. Two or three kinds of arthritis. Uses arava, tramadol.  ? ?Taking clorazepate when she gets real nervous. Calms her down. SAys ahe will be driving down the road and has to stop and cry for a while. Gets to [place].  ? ?Was told by her brain doctor she needed to go to assisted living. She adamantly refuses ? ? ?  09/11/2021  ?  5:23 PM 10/20/2020  ?  9:42 AM 07/20/2020  ? 10:10 AM  ?GAD 7 : Generalized Anxiety Score  ?Nervous, Anxious, on Edge '2 3 2  '$ ?Control/stop worrying '1 3 2  '$ ?Worry too much - different things '1 3 2  '$ ?Trouble relaxing '1 1 2  '$ ?Restless 1 0 0  ?Easily annoyed or irritable 1 0 2  ?Afraid - awful might happen '1 1 2  '$ ?Total GAD 7 Score '8 11 12  '$ ?Anxiety Difficulty Not difficult at all    ? ? ? ? ? ? ?  09/11/2021  ?  5:22 PM 09/11/2021  ? 11:06 AM 01/24/2021  ?  9:30 AM  ?Depression screen PHQ 2/9  ?Decreased Interest 1 0 0  ?Down, Depressed, Hopeless 1 0 1  ?PHQ - 2 Score 2 0 1  ?Altered sleeping 1  1  ?Tired, decreased energy 1  2  ?Change in appetite 1  1  ?Feeling bad or failure about yourself  1  0  ?Trouble concentrating 1  3  ?Moving slowly or fidgety/restless 1  0  ?Suicidal thoughts 0  0  ?PHQ-9 Score 8  8  ?Difficult doing work/chores Not difficult at all    ? ? ?History ?[name] has a past medical history of Acute on chronic respiratory failure with hypoxia ([place]) (04/24/2018), Anxiety, Atypical chest pain, Bronchospasm, CAD (coronary artery disease), Cervical disc disorder with myelopathy, unspecified cervical region, Cervicalgia (01/19/2009), Chest pain at  rest (06/15/5425), Chronic systolic (congestive) heart failure ([place]), COPD (chronic obstructive pulmonary disease) ([place]), Coronary artery disease, De Quervain's disease (tenosynovitis) (04/02/2011), Disc disease with myelopathy, cervical, Essential hypertension, Hemorrhoids, Liver mass, Lung, cysts, congenital, Myocardial infarction ([place]), Nephrolithiasis, Nonischemic cardiomyopathy ([place]), On home O2, Osteoarthritis, Sprain of wrist (08/07/2012), and Thoracic ascending aortic aneurysm ([place]).  ? ?She has a past surgical history that includes Tonsillectomy and adenoidectomy; Complete hysterectomy; Benign breast tumors; Cholecystectomy; Colonoscopy; Colonoscopy (N/A, 09/22/2014); LEFT HEART CATH AND CORONARY ANGIOGRAPHY (N/A, 11/23/2016); and CORONARY STENT INTERVENTION (N/A, 11/23/2016).  ? ?Her family history includes Aneurysm in her father; Diabetes in her brother; Heart disease in her brother, mother, and sister; Lung cancer in her brother.She reports that she quit smoking about 13 years ago. Her smoking use included cigarettes. She started smoking about 44 years ago. She has a 0.75 pack-year smoking history. She has never used smokeless tobacco. She reports that she does not drink alcohol and does not use drugs. ? ? ? ?ROS ?Review of Systems  ?Constitutional: Negative.   ?HENT:  Negative for congestion.   ?Eyes:  Negative for visual disturbance.  ?Respiratory:  Negative for shortness  of breath.   ?Cardiovascular:  Negative for chest pain.  ?Gastrointestinal:  Negative for abdominal pain, constipation, diarrhea, nausea and vomiting.  ?Genitourinary:  Negative for difficulty urinating.  ?Musculoskeletal:  Negative for arthralgias and myalgias.  ?Neurological:  Negative for headaches.  ?Psychiatric/Behavioral:  Negative for sleep disturbance.   ? ?Objective:  ?BP 112/67   Pulse 75   Temp 97.6 ?F (36.4 ?C)   Ht '5\' 2"'$  (1.575 m)   Wt 176 lb 6.4 oz (80 kg)   SpO2 95%   BMI 32.26 kg/m?  ? ?BP Readings from Last 3  Encounters:  ?09/11/21 112/67  ?08/25/21 112/60  ?07/14/21 108/67  ? ? ?Wt Readings from Last 3 Encounters:  ?09/11/21 176 lb 6.4 oz (80 kg)  ?08/25/21 180 lb (81.6 kg)  ?07/14/21 178 lb 2.1 oz (80.8 kg)  ? ? ? ?Physical Exam ?Constitutional:   ?   General: She is not in acute distress. ?   Appearance: She is well-developed.  ?HENT:  ?   Head: Normocephalic and atraumatic.  ?Eyes:  ?   Conjunctiva/sclera: Conjunctivae normal.  ?   Pupils: Pupils are equal, round, and reactive to light.  ?Neck:  ?   Thyroid: No thyromegaly.  ?Cardiovascular:  ?   Rate and Rhythm: Normal rate and regular rhythm.  ?   Heart sounds: Normal heart sounds. No murmur heard. ?Pulmonary:  ?   Effort: Pulmonary effort is normal. No respiratory distress.  ?   Breath sounds: Normal breath sounds. No wheezing or rales.  ?Abdominal:  ?   General: Bowel sounds are normal. There is no distension.  ?   Palpations: Abdomen is soft.  ?   Tenderness: There is no abdominal tenderness.  ?Musculoskeletal:     ?   General: Normal range of motion.  ?   Cervical back: Normal range of motion and neck supple.  ?Lymphadenopathy:  ?   Cervical: No cervical adenopathy.  ?Skin: ?   General: Skin is warm and dry.  ?Neurological:  ?   Mental Status: She is alert and oriented to person, place, and time.  ?Psychiatric:     ?   Behavior: Behavior normal.     ?   Thought Content: Thought content normal.     ?   Judgment: Judgment normal.  ? ? ? ? ?Assessment & Plan:  ? ?[name] was seen today for new patient (initial visit). ? ?Diagnoses and all orders for this visit: ? ?Centrilobular emphysema ([name]) ? ?Essential hypertension ? ?Chronic combined systolic and diastolic CHF (congestive heart failure) ([place]) ? ?Hypertensive heart disease with congestive heart failure, unspecified heart failure type ([place]) ? ?Anxiety ? ?Chronic pain syndrome ? ?Rheumatoid arthritis of multiple sites without rheumatoid factor ([place]) ? ? ? ? ? ? ?I have discontinued Aranda Bihm. Preiss's miconazole,  atorvastatin, Entresto, and atorvastatin. I am also having her maintain her aspirin EC, OXYGEN, nitroGLYCERIN, Trelegy Ellipta, albuterol, meclizine, traMADol, clorazepate, vitamin C, Vitamin D3, Cranberry, carvedilol, isosorbide mononitrate, pantoprazole, and leflunomide. ? ?Allergies as of 09/11/2021   ? ?   Reactions  ? Plavix [clopidogrel Bisulfate] Itching  ? Severe itching  ? Alprazolam Nausea And Vomiting  ? Codeine Nausea And Vomiting  ? Percodan [oxycodone-aspirin] Nausea And Vomiting  ? Valium Nausea And Vomiting  ? ?  ? ?  ?Medication List  ?  ? ?  ? Accurate as of Sep 11, 2021  9:51 PM. If you have any questions, ask your nurse or doctor.  ?  ?  ? ?  ? ?  STOP taking these medications   ? ?atorvastatin 40 MG tablet ?Commonly known as: LIPITOR ?Stopped by: Claretta Fraise, MD ?  ?Entresto 24-26 MG ?Generic drug: sacubitril-valsartan ?Stopped by: Claretta Fraise, MD ?  ?miconazole 2 % cream ?Commonly known as: MICOTIN ?Stopped by: Claretta Fraise, MD ?  ? ?  ? ?TAKE these medications   ? ?albuterol 108 (90 Base) MCG/ACT inhaler ?Commonly known as: Ventolin HFA ?Inhale 2 puffs into the lungs every 4 (four) hours as needed for wheezing or shortness of breath. ?What changed: Another medication with the same name was removed. Continue taking this medication, and follow the directions you see here. ?Changed by: Claretta Fraise, MD ?  ?aspirin EC 81 MG tablet ?Take 81 mg by mouth every morning. ?  ?carvedilol 6.25 MG tablet ?Commonly known as: COREG ?Take 1 tablet (6.25 mg total) by mouth 2 (two) times daily with a meal. ?What changed: Another medication with the same name was removed. Continue taking this medication, and follow the directions you see here. ?Changed by: Claretta Fraise, MD ?  ?clorazepate 7.5 MG tablet ?Commonly known as: TRANXENE ?TAKE 1 TABLET BY MOUTH ONCE A DAY AS NEEDED FOR ANXIETY. ?  ?Cranberry 500 MG Tabs ?Take 1 tablet by mouth daily. ?  ?isosorbide mononitrate 30 MG 24 hr tablet ?Commonly known as:  IMDUR ?Take 0.5 tablets (15 mg total) by mouth daily. ?  ?leflunomide 20 MG tablet ?Commonly known as: ARAVA ?Take 20 mg by mouth daily. ?What changed: Another medication with the same name was removed. Continue taking

## 2021-09-12 DIAGNOSIS — Z20822 Contact with and (suspected) exposure to covid-19: Secondary | ICD-10-CM | POA: Diagnosis not present

## 2021-09-18 ENCOUNTER — Other Ambulatory Visit: Payer: Self-pay | Admitting: Internal Medicine

## 2021-09-18 DIAGNOSIS — Z20822 Contact with and (suspected) exposure to covid-19: Secondary | ICD-10-CM | POA: Diagnosis not present

## 2021-09-26 ENCOUNTER — Ambulatory Visit (INDEPENDENT_AMBULATORY_CARE_PROVIDER_SITE_OTHER): Payer: Medicare Other

## 2021-09-26 ENCOUNTER — Ambulatory Visit (INDEPENDENT_AMBULATORY_CARE_PROVIDER_SITE_OTHER): Payer: Medicare Other | Admitting: Family Medicine

## 2021-09-26 ENCOUNTER — Other Ambulatory Visit: Payer: Self-pay | Admitting: Family Medicine

## 2021-09-26 ENCOUNTER — Encounter: Payer: Self-pay | Admitting: Family Medicine

## 2021-09-26 VITALS — BP 135/62 | HR 79 | Temp 97.6°F | Ht 62.0 in | Wt 175.4 lb

## 2021-09-26 DIAGNOSIS — Z780 Asymptomatic menopausal state: Secondary | ICD-10-CM

## 2021-09-26 DIAGNOSIS — F419 Anxiety disorder, unspecified: Secondary | ICD-10-CM | POA: Diagnosis not present

## 2021-09-26 DIAGNOSIS — I25118 Atherosclerotic heart disease of native coronary artery with other forms of angina pectoris: Secondary | ICD-10-CM | POA: Diagnosis not present

## 2021-09-26 DIAGNOSIS — M810 Age-related osteoporosis without current pathological fracture: Secondary | ICD-10-CM | POA: Diagnosis not present

## 2021-09-26 MED ORDER — ALENDRONATE SODIUM 70 MG PO TABS: 70.0000 mg | ORAL_TABLET | ORAL | 11 refills | Status: DC

## 2021-09-26 MED ORDER — CLORAZEPATE DIPOTASSIUM 7.5 MG PO TABS: 7.5000 mg | ORAL_TABLET | Freq: Every day | ORAL | 2 refills | Status: DC | PRN

## 2021-09-26 MED ORDER — TRAMADOL HCL 50 MG PO TABS: ORAL_TABLET | ORAL | Status: DC

## 2021-09-26 NOTE — Progress Notes (Signed)
? ?Subjective:  ?Patient ID: [name], female    DOB: 06-13-1935  Age: 86 y.o. MRN: [id number] ? ?CC: Follow-up (Emphysema/) ? ? ?HPI ?[name] presents for recheck of COPD and her rheumatoid arthritis. Wants something for pain. Hurts all over from RA. Takes arava for it. Still has daily moderate to severe pain. Has had tramadol in the past.  ? ?Breathing well with using Trelegy. Able to do a mild routine without dyspnea.  ? ?Using tranxene for anxiety long-term.  ? ?PDMP review shows appropriate utilization of the patient's controlled medications. ? ? ? ?  09/26/2021  ?  2:31 PM 09/26/2021  ?  2:25 PM 09/11/2021  ?  5:22 PM  ?Depression screen PHQ 2/9  ?Decreased Interest 0 0 1  ?Down, Depressed, Hopeless 0 0 1  ?PHQ - 2 Score 0 0 2  ?Altered sleeping   1  ?Tired, decreased energy   1  ?Change in appetite   1  ?Feeling bad or failure about yourself    1  ?Trouble concentrating   1  ?Moving slowly or fidgety/restless   1  ?Suicidal thoughts   0  ?PHQ-9 Score   8  ?Difficult doing work/chores   Not difficult at all  ? ? ?History ?[name] has a past medical history of Acute on chronic respiratory failure with hypoxia ([place]) (04/24/2018), Anxiety, Atypical chest pain, Bronchospasm, CAD (coronary artery disease), Cervical disc disorder with myelopathy, unspecified cervical region, Cervicalgia (01/19/2009), Chest pain at rest (08/10/5186), Chronic systolic (congestive) heart failure ([place]), COPD (chronic obstructive pulmonary disease) ([place]), Coronary artery disease, De Quervain's disease (tenosynovitis) (04/02/2011), Disc disease with myelopathy, cervical, Essential hypertension, Hemorrhoids, Liver mass, Lung, cysts, congenital, Myocardial infarction ([place]), Nephrolithiasis, Nonischemic cardiomyopathy ([place]), On home O2, Osteoarthritis, Sprain of wrist (08/07/2012), and Thoracic ascending aortic aneurysm ([place]).  ? ?She has a past surgical history that includes Tonsillectomy and adenoidectomy; Complete hysterectomy; Benign  breast tumors; Cholecystectomy; Colonoscopy; Colonoscopy (N/A, 09/22/2014); LEFT HEART CATH AND CORONARY ANGIOGRAPHY (N/A, 11/23/2016); and CORONARY STENT INTERVENTION (N/A, 11/23/2016).  ? ?Her family history includes Aneurysm in her father; Diabetes in her brother; Heart disease in her brother, mother, and sister; Lung cancer in her brother.She reports that she quit smoking about 13 years ago. Her smoking use included cigarettes. She started smoking about 44 years ago. She has a 0.75 pack-year smoking history. She has never used smokeless tobacco. She reports that she does not drink alcohol and does not use drugs. ? ? ? ?ROS ?Review of Systems  ?Constitutional: Negative.   ?HENT: Negative.    ?Eyes:  Negative for visual disturbance.  ?Respiratory:  Negative for shortness of breath.   ?Cardiovascular:  Negative for chest pain.  ?Gastrointestinal:  Negative for abdominal pain.  ?Musculoskeletal:  Negative for arthralgias.  ? ?Objective:  ?BP 135/62   Pulse 79   Temp 97.6 ?F (36.4 ?C)   Ht '5\' 2"'$  (1.575 m)   Wt 175 lb 6.4 oz (79.6 kg)   SpO2 95%   BMI 32.08 kg/m?  ? ?BP Readings from Last 3 Encounters:  ?09/26/21 135/62  ?09/11/21 112/67  ?08/25/21 112/60  ? ? ?Wt Readings from Last 3 Encounters:  ?09/26/21 175 lb 6.4 oz (79.6 kg)  ?09/11/21 176 lb 6.4 oz (80 kg)  ?08/25/21 180 lb (81.6 kg)  ? ? ? ?Physical Exam ?Constitutional:   ?   General: She is not in acute distress. ?   Appearance: She is well-developed.  ?Cardiovascular:  ?   Rate  and Rhythm: Normal rate and regular rhythm.  ?Pulmonary:  ?   Breath sounds: Normal breath sounds.  ?Musculoskeletal:     ?   General: Normal range of motion.  ?Skin: ?   General: Skin is warm and dry.  ?Neurological:  ?   Mental Status: She is alert and oriented to person, place, and time.  ? ? ? ? ?Assessment & Plan:  ? ?Lutricia was seen today for follow-up. ? ?Diagnoses and all orders for this visit: ? ?Postmenopause ?-     DG Bone Density; Future ? ?Anxiety ?-     clorazepate  (TRANXENE) 7.5 MG tablet; Take 1 tablet (7.5 mg total) by mouth daily as needed for anxiety. ? ?Other orders ?-     traMADol (ULTRAM) 50 MG tablet; TAKE ONE TABLET BY MOUTH ONCE DAILY AS NEEDED. ? ? ? ? ? ? ?I have discontinued Delayla Hoffmaster. Schaffert's pantoprazole. I have also changed her clorazepate. Additionally, I am having her maintain her aspirin EC, OXYGEN, nitroGLYCERIN, Trelegy Ellipta, albuterol, meclizine, vitamin C, Vitamin D3, Cranberry, carvedilol, isosorbide mononitrate, leflunomide, and traMADol. ? ?Allergies as of 09/26/2021   ? ?   Reactions  ? Plavix [clopidogrel Bisulfate] Itching  ? Severe itching  ? Alprazolam Nausea And Vomiting  ? Codeine Nausea And Vomiting  ? Percodan [oxycodone-aspirin] Nausea And Vomiting  ? Valium Nausea And Vomiting  ? ?  ? ?  ?Medication List  ?  ? ?  ? Accurate as of Sep 26, 2021  2:58 PM. If you have any questions, ask your nurse or doctor.  ?  ?  ? ?  ? ?STOP taking these medications   ? ?pantoprazole 40 MG tablet ?Commonly known as: PROTONIX ?Stopped by: Claretta Fraise, MD ?  ? ?  ? ?TAKE these medications   ? ?albuterol 108 (90 Base) MCG/ACT inhaler ?Commonly known as: Ventolin HFA ?Inhale 2 puffs into the lungs every 4 (four) hours as needed for wheezing or shortness of breath. ?  ?aspirin EC 81 MG tablet ?Take 81 mg by mouth every morning. ?  ?carvedilol 6.25 MG tablet ?Commonly known as: COREG ?Take 1 tablet (6.25 mg total) by mouth 2 (two) times daily with a meal. ?  ?clorazepate 7.5 MG tablet ?Commonly known as: TRANXENE ?Take 1 tablet (7.5 mg total) by mouth daily as needed for anxiety. ?  ?Cranberry 500 MG Tabs ?Take 1 tablet by mouth daily. ?  ?isosorbide mononitrate 30 MG 24 hr tablet ?Commonly known as: IMDUR ?Take 0.5 tablets (15 mg total) by mouth daily. ?  ?leflunomide 20 MG tablet ?Commonly known as: ARAVA ?Take 20 mg by mouth daily. ?  ?meclizine 25 MG tablet ?Commonly known as: ANTIVERT ?Take 1 tablet (25 mg total) by mouth as needed for dizziness. ?   ?nitroGLYCERIN 0.4 MG SL tablet ?Commonly known as: NITROSTAT ?Place 1 tablet (0.4 mg total) under the tongue every 5 (five) minutes x 3 doses as needed for chest pain. ?  ?OXYGEN ?Inhale 2 L into the lungs at bedtime. ?  ?traMADol 50 MG tablet ?Commonly known as: ULTRAM ?TAKE ONE TABLET BY MOUTH ONCE DAILY AS NEEDED. ?  ?Trelegy Ellipta 100-62.5-25 MCG/ACT Aepb ?Generic drug: Fluticasone-Umeclidin-Vilant ?Inhale 1 puff into the lungs daily. ?  ?vitamin C 100 MG tablet ?Take 100 mg by mouth daily. ?  ?Vitamin D3 10 MCG (400 UNIT) Caps ?Take 1 capsule by mouth daily. ?  ? ?  ? ? ? ?Follow-up: Return in about 3 months (around 12/27/2021). ? ?  Claretta Fraise, M.D. ?

## 2021-09-26 NOTE — Progress Notes (Signed)
DEXA shows moderately severe osteoporosis. I recommend weekly fosamax. ?I sent in a prescription to [facility]. Please remind her to take Calcium 500 mg BID and vitamin D 2000 units daily ?Thanks, ?WS ?

## 2021-09-27 ENCOUNTER — Other Ambulatory Visit: Payer: Self-pay | Admitting: *Deleted

## 2021-09-27 MED ORDER — VITAMIN D 50 MCG (2000 UT) PO TABS: 2000.0000 [IU] | ORAL_TABLET | Freq: Every day | ORAL | 3 refills | Status: DC

## 2021-09-27 MED ORDER — OYSTER SHELL CALCIUM/D3 500-5 MG-MCG PO TABS: 1.0000 | ORAL_TABLET | Freq: Two times a day (BID) | ORAL | 3 refills | Status: DC

## 2021-10-25 ENCOUNTER — Ambulatory Visit: Payer: Medicare Other | Admitting: Pulmonary Disease

## 2021-10-26 ENCOUNTER — Ambulatory Visit (INDEPENDENT_AMBULATORY_CARE_PROVIDER_SITE_OTHER): Payer: Medicare Other | Admitting: Pulmonary Disease

## 2021-10-26 ENCOUNTER — Encounter: Payer: Self-pay | Admitting: Family Medicine

## 2021-10-26 ENCOUNTER — Encounter: Payer: Self-pay | Admitting: Pulmonary Disease

## 2021-10-26 ENCOUNTER — Ambulatory Visit (INDEPENDENT_AMBULATORY_CARE_PROVIDER_SITE_OTHER): Payer: Medicare Other | Admitting: Family Medicine

## 2021-10-26 VITALS — BP 115/63 | HR 76 | Temp 98.5°F | Ht 62.0 in | Wt 175.0 lb

## 2021-10-26 VITALS — BP 134/72 | HR 74 | Temp 97.8°F | Ht 62.0 in | Wt 175.2 lb

## 2021-10-26 DIAGNOSIS — R300 Dysuria: Secondary | ICD-10-CM | POA: Diagnosis not present

## 2021-10-26 DIAGNOSIS — I25118 Atherosclerotic heart disease of native coronary artery with other forms of angina pectoris: Secondary | ICD-10-CM

## 2021-10-26 DIAGNOSIS — N3000 Acute cystitis without hematuria: Secondary | ICD-10-CM | POA: Diagnosis not present

## 2021-10-26 DIAGNOSIS — Z6830 Body mass index (BMI) 30.0-30.9, adult: Secondary | ICD-10-CM | POA: Insufficient documentation

## 2021-10-26 DIAGNOSIS — J9611 Chronic respiratory failure with hypoxia: Secondary | ICD-10-CM

## 2021-10-26 DIAGNOSIS — J432 Centrilobular emphysema: Secondary | ICD-10-CM

## 2021-10-26 DIAGNOSIS — M2550 Pain in unspecified joint: Secondary | ICD-10-CM | POA: Insufficient documentation

## 2021-10-26 DIAGNOSIS — M1991 Primary osteoarthritis, unspecified site: Secondary | ICD-10-CM | POA: Insufficient documentation

## 2021-10-26 DIAGNOSIS — J449 Chronic obstructive pulmonary disease, unspecified: Secondary | ICD-10-CM | POA: Diagnosis not present

## 2021-10-26 DIAGNOSIS — E669 Obesity, unspecified: Secondary | ICD-10-CM | POA: Insufficient documentation

## 2021-10-26 DIAGNOSIS — G9332 Myalgic encephalomyelitis/chronic fatigue syndrome: Secondary | ICD-10-CM | POA: Insufficient documentation

## 2021-10-26 LAB — URINALYSIS, ROUTINE W REFLEX MICROSCOPIC
Bilirubin, UA: NEGATIVE
Glucose, UA: NEGATIVE
Nitrite, UA: NEGATIVE
Protein,UA: NEGATIVE
RBC, UA: NEGATIVE
Specific Gravity, UA: 1.015 (ref 1.005–1.030)
Urobilinogen, Ur: 0.2 mg/dL (ref 0.2–1.0)
pH, UA: 6 (ref 5.0–7.5)

## 2021-10-26 LAB — MICROSCOPIC EXAMINATION
RBC, Urine: NONE SEEN /hpf (ref 0–2)
Renal Epithel, UA: NONE SEEN /hpf

## 2021-10-26 MED ORDER — SULFAMETHOXAZOLE-TRIMETHOPRIM 800-160 MG PO TABS: 1.0000 | ORAL_TABLET | Freq: Two times a day (BID) | ORAL | 0 refills | Status: DC

## 2021-10-26 NOTE — Patient Instructions (Signed)
Follow up in 1 year.

## 2021-10-26 NOTE — Progress Notes (Signed)
[facility]  Chief Complaint  Patient presents with   Follow-up    Breathing doing well needs refills on inhalers.     Constitutional:  BP 134/72 (BP Location: Left Arm, Patient Position: Sitting)   Pulse 74   Temp 97.8 F (36.6 C) (Temporal)   Ht '5\' 2"'$  (1.575 m)   Wt 175 lb 3.2 oz (79.5 kg)   SpO2 96% Comment: ra  BMI 32.04 kg/m   Past Medical History:  Congenital lung cyst, HTN, CAD s/p DES, ischemic cardiomyopathy  Past Surgical History:  Her  has a past surgical history that includes Tonsillectomy and adenoidectomy; Complete hysterectomy; Benign breast tumors; Cholecystectomy; Colonoscopy; Colonoscopy (N/A, 09/22/2014); LEFT HEART CATH AND CORONARY ANGIOGRAPHY (N/A, 11/23/2016); and CORONARY STENT INTERVENTION (N/A, 11/23/2016).  Brief Summary:  [name] is a 86 y.o. female former smoker with COPD and chronic respiratory failure with hypoxia..       Subjective:   She was in hospital in March for chest pain and CHF.  She feels her breathing is doing better recently.  Not having cough, wheeze, or sputum.  Using 2 liters oxygen with exertion and sleep at home.  Trelegy helps.  Hasn't needed to use albuterol much.  Has noticed pain in her Lt leg - she has appointment with primary care later this afternoon to assess further.  Chest xray from 07/13/21 showed cardiomegaly.  Physical Exam:   Appearance - well kempt   ENMT - no sinus tenderness, no oral exudate, no LAN, Mallampati 2 airway, no stridor, poor hearing  Respiratory - equal breath sounds bilaterally, no wheezing or rales  CV - s1s2 regular rate and rhythm, no murmurs  Ext - no clubbing, no edema  Skin - no rashes  Psych - normal mood and affect    Pulmonary testing:  PFT 08/31/11 >> FEV1 1.44 (70%), FEV1% 75, TLC 5.32 (108%), DLCO 61%  Cardiac Tests:  Echo 05/26/21 >> EF 35 to 40%, grade 1 DD, mod pericardial effusion, mild MR, mild AR  Social History:   She  reports that she quit smoking about 13 years ago. Her smoking use included cigarettes. She started smoking about 44 years ago. She has a 0.75 pack-year smoking history. She has never used smokeless tobacco. She reports that she does not drink alcohol and does not use drugs.  Family History:  Her family history includes Aneurysm in her father; Diabetes in her brother; Heart disease in her brother, mother, and sister; Lung cancer in her brother.     Assessment/Plan:   COPD with asthma and emphysema. - continue trelegy 100 one puff daily - prn albuterol - discussed different roles for her inhaler therapies  Upper airway cough from post nasal drip in setting of seasonal allergic rhinitis. - singulair caused nightmares - prn flonase  Chronic respiratory failure with hypoxia from COPD. - 2 liters with exertion and sleep - goal SpO2 > 90%  Lt leg pain. - she will be assessed by primary care later today  CAD, ischemic CM, HTN, Thoracic aortic aneurysm. - followed by Dr. Jenkins Rouge with cardiology  Time Spent Involved in Patient Care on Day of Examination:  38 minutes  Follow up:   Patient Instructions  Follow up in 1 year  Medication List:   Allergies as of 10/26/2021       Reactions   Plavix [clopidogrel Bisulfate] Itching   Severe itching   Alprazolam Nausea And Vomiting   Codeine Nausea And  Vomiting   Percodan [oxycodone-aspirin] Nausea And Vomiting   Valium Nausea And Vomiting        Medication List        Accurate as of October 26, 2021 10:56 AM. If you have any questions, ask your nurse or doctor.          albuterol 108 (90 Base) MCG/ACT inhaler Commonly known as: Ventolin HFA Inhale 2 puffs into the lungs every 4 (four) hours as needed for wheezing or shortness of breath.   alendronate 70 MG tablet Commonly known as: FOSAMAX Take 1 tablet (70 mg total) by mouth every 7 (seven) days. Take with a full glass of water on an empty stomach. Do not lay  down for at least 2 hours   aspirin EC 81 MG tablet Take 81 mg by mouth every morning.   calcium-vitamin D 500-5 MG-MCG tablet Commonly known as: OSCAL WITH D Take 1 tablet by mouth 2 (two) times daily.   carvedilol 6.25 MG tablet Commonly known as: COREG Take 1 tablet (6.25 mg total) by mouth 2 (two) times daily with a meal.   clorazepate 7.5 MG tablet Commonly known as: TRANXENE Take 1 tablet (7.5 mg total) by mouth daily as needed for anxiety.   Cranberry 500 MG Tabs Take 1 tablet by mouth daily.   isosorbide mononitrate 30 MG 24 hr tablet Commonly known as: IMDUR Take 0.5 tablets (15 mg total) by mouth daily.   leflunomide 20 MG tablet Commonly known as: ARAVA Take 20 mg by mouth daily.   meclizine 25 MG tablet Commonly known as: ANTIVERT Take 1 tablet (25 mg total) by mouth as needed for dizziness.   nitroGLYCERIN 0.4 MG SL tablet Commonly known as: NITROSTAT Place 1 tablet (0.4 mg total) under the tongue every 5 (five) minutes x 3 doses as needed for chest pain.   OXYGEN Inhale 2 L into the lungs at bedtime.   traMADol 50 MG tablet Commonly known as: ULTRAM TAKE ONE TABLET BY MOUTH ONCE DAILY AS NEEDED.   Trelegy Ellipta 100-62.5-25 MCG/ACT Aepb Generic drug: Fluticasone-Umeclidin-Vilant Inhale 1 puff into the lungs daily.   vitamin C 100 MG tablet Take 100 mg by mouth daily.   Vitamin D3 10 MCG (400 UNIT) Caps Take 1 capsule by mouth daily.   Vitamin D 50 MCG (2000 UT) tablet Take 1 tablet (2,000 Units total) by mouth daily.        Signature:  Chesley Mires, MD [place] Pager - [phone] 10/26/2021, 10:56 AM

## 2021-10-26 NOTE — Progress Notes (Signed)
Subjective:  Patient ID: [name], female    DOB: 03-17-1936, 86 y.o.   MRN: [id number]  Patient Care Team: Claretta Fraise, MD as PCP - General (Family Medicine)   Chief Complaint:  Dysuria   HPI: [name] is a 86 y.o. female presenting on 10/26/2021 for Dysuria   Dysuria  This is a new problem. The current episode started in the past 7 days. The problem occurs every urination. The quality of the pain is described as burning and aching. The pain is mild. There has been no fever. She is Not sexually active. There is No history of pyelonephritis. Associated symptoms include frequency. Pertinent negatives include no chills, discharge, flank pain, hematuria, hesitancy, nausea, possible pregnancy, sweats, urgency or vomiting. She has tried nothing for the symptoms.     Relevant past medical, surgical, family, and social history reviewed and updated as indicated.  Allergies and medications reviewed and updated. Data reviewed: Chart in Epic.   Past Medical History:  Diagnosis Date   Acute on chronic respiratory failure with hypoxia (HCC) 04/24/2018   Anxiety    Atypical chest pain    Bronchospasm    CAD (coronary artery disease)    a. s/p DES to mid-LAD and DES to mid-OM1 in 11/2016   Cervical disc disorder with myelopathy, unspecified cervical region    Cervicalgia 01/19/2009   Qualifier: Diagnosis of  By: Aline Brochure MD, [place]     Chest pain at rest 08/20/8117   Chronic systolic (congestive) heart failure (HCC)    COPD (chronic obstructive pulmonary disease) (HCC)    Coronary artery disease    De Quervain's disease (tenosynovitis) 04/02/2011   Disc disease with myelopathy, cervical    Essential hypertension    Hemorrhoids    Liver mass    Lung, cysts, congenital    Left lung cyst   Myocardial infarction ([place])    Nephrolithiasis    Embedded   Nonischemic cardiomyopathy ([place])    LVEF 35-40% 2015   On home O2    Osteoarthritis    Sprain of wrist 08/07/2012    Thoracic ascending aortic aneurysm (HCC)    4.3 cm April 2016    Past Surgical History:  Procedure Laterality Date   Benign breast tumors     CHOLECYSTECTOMY     COLONOSCOPY     COLONOSCOPY N/A 09/22/2014   Procedure: COLONOSCOPY;  Surgeon: Rogene Houston, MD;  [facility]: [facility];  Service: Endoscopy;  Laterality: N/A;  830 -- to be done in OR under fluoro   Complete hysterectomy     CORONARY STENT INTERVENTION N/A 11/23/2016   Procedure: Coronary Stent Intervention;  Surgeon: Martinique, Peter M, MD;  Location: [facility];  Service: Cardiovascular;  Laterality: N/A;   LEFT HEART CATH AND CORONARY ANGIOGRAPHY N/A 11/23/2016   Procedure: Left Heart Cath and Coronary Angiography;  Surgeon: Martinique, Peter M, MD;  Location: [facility];  Service: Cardiovascular;  Laterality: N/A;   TONSILLECTOMY AND ADENOIDECTOMY      Social History   Socioeconomic History   Marital status: Widowed    Spouse name: Not on file   Number of children: Not on file   Years of education: 9th   Highest education level: Not on file  Occupational History    Employer: RETIRED  Tobacco Use   Smoking status: Former    Packs/day: 0.25    Years: 3.00    Total pack years: 0.75    Types: Cigarettes  Start date: 05/14/1977    Quit date: 05/28/2008    Years since quitting: 13.4   Smokeless tobacco: Never   Tobacco comments:    patient states she only smoked for 3 years total  Vaping Use   Vaping Use: Never used  Substance and Sexual Activity   Alcohol use: No    Alcohol/week: 0.0 standard drinks of alcohol   Drug use: No   Sexual activity: Not Currently    Birth control/protection: Surgical    Comment: hyst  Other Topics Concern   Not on file  Social History Narrative   Not on file   Social Determinants of Health   Financial Resource Strain: Low Risk  (01/06/2021)   Overall Financial Resource Strain (CARDIA)    Difficulty of Paying Living Expenses: Not very hard  Food Insecurity: No  Food Insecurity (01/06/2021)   Hunger Vital Sign    Worried About Running Out of Food in the Last Year: Never true    Ran Out of Food in the Last Year: Never true  Transportation Needs: No Transportation Needs (01/06/2021)   PRAPARE - Hydrologist (Medical): No    Lack of Transportation (Non-Medical): No  Physical Activity: Inactive (01/06/2021)   Exercise Vital Sign    Days of Exercise per Week: 0 days    Minutes of Exercise per Session: 0 min  Stress: Stress Concern Present (01/06/2021)   [place]    Feeling of Stress : Very much  Social Connections: Socially Isolated (01/06/2021)   Social Connection and Isolation Panel [NHANES]    Frequency of Communication with Friends and Family: Three times a week    Frequency of Social Gatherings with Friends and Family: Twice a week    Attends Religious Services: Never    Marine scientist or Organizations: No    Attends Archivist Meetings: Never    Marital Status: Widowed  Intimate Partner Violence: Not At Risk (01/06/2021)   Humiliation, Afraid, Rape, and Kick questionnaire    Fear of Current or Ex-Partner: No    Emotionally Abused: No    Physically Abused: No    Sexually Abused: No    Outpatient Encounter Medications as of 10/26/2021  Medication Sig   albuterol (VENTOLIN HFA) 108 (90 Base) MCG/ACT inhaler Inhale 2 puffs into the lungs every 4 (four) hours as needed for wheezing or shortness of breath.   alendronate (FOSAMAX) 70 MG tablet Take 1 tablet (70 mg total) by mouth every 7 (seven) days. Take with a full glass of water on an empty stomach. Do not lay down for at least 2 hours   Ascorbic Acid (VITAMIN C) 100 MG tablet Take 100 mg by mouth daily.   aspirin EC 81 MG tablet Take 81 mg by mouth every morning.    carvedilol (COREG) 6.25 MG tablet Take 1 tablet (6.25 mg total) by mouth 2 (two) times daily with a meal.    Cholecalciferol (VITAMIN D) 50 MCG (2000 UT) tablet Take 1 tablet (2,000 Units total) by mouth daily.   Cholecalciferol (VITAMIN D3) 10 MCG (400 UNIT) CAPS Take 1 capsule by mouth daily.   clorazepate (TRANXENE) 7.5 MG tablet Take 1 tablet (7.5 mg total) by mouth daily as needed for anxiety.   Cranberry 500 MG TABS Take 1 tablet by mouth daily.   Fluticasone-Umeclidin-Vilant (TRELEGY ELLIPTA) 100-62.5-25 MCG/INH AEPB Inhale 1 puff into the lungs daily.   isosorbide mononitrate (IMDUR) 30  MG 24 hr tablet Take 0.5 tablets (15 mg total) by mouth daily.   leflunomide (ARAVA) 20 MG tablet Take 20 mg by mouth daily.   meclizine (ANTIVERT) 25 MG tablet Take 1 tablet (25 mg total) by mouth as needed for dizziness.   nitroGLYCERIN (NITROSTAT) 0.4 MG SL tablet Place 1 tablet (0.4 mg total) under the tongue every 5 (five) minutes x 3 doses as needed for chest pain.   OXYGEN Inhale 2 L into the lungs at bedtime.   sulfamethoxazole-trimethoprim (BACTRIM DS) 800-160 MG tablet Take 1 tablet by mouth 2 (two) times daily for 5 days.   traMADol (ULTRAM) 50 MG tablet TAKE ONE TABLET BY MOUTH ONCE DAILY AS NEEDED.   calcium-vitamin D (OSCAL WITH D) 500-5 MG-MCG tablet Take 1 tablet by mouth 2 (two) times daily. (Patient not taking: Reported on 10/26/2021)   ENTRESTO 24-26 MG Take 1 tablet by mouth 2 (two) times daily.   No facility-administered encounter medications on file as of 10/26/2021.    Allergies  Allergen Reactions   Plavix [Clopidogrel Bisulfate] Itching    Severe itching   Alprazolam Nausea And Vomiting   Codeine Nausea And Vomiting   Percodan [Oxycodone-Aspirin] Nausea And Vomiting   Valium Nausea And Vomiting    Review of Systems  Constitutional:  Negative for activity change, appetite change, chills, diaphoresis, fatigue, fever and unexpected weight change.  Respiratory:  Positive for cough and shortness of breath.   Cardiovascular:  Negative for chest pain, palpitations and leg swelling.   Gastrointestinal:  Negative for abdominal distention, abdominal pain, anal bleeding, blood in stool, constipation, diarrhea, nausea, rectal pain and vomiting.  Genitourinary:  Positive for dysuria and frequency. Negative for decreased urine volume, difficulty urinating, flank pain, hematuria, hesitancy, urgency, vaginal bleeding, vaginal discharge and vaginal pain.  Musculoskeletal:  Positive for arthralgias.  Neurological:  Negative for dizziness, weakness and headaches.  Psychiatric/Behavioral:  Negative for confusion.   All other systems reviewed and are negative.       Objective:  BP 115/63   Pulse 76   Temp 98.5 F (36.9 C)   Ht '5\' 2"'$  (1.575 m)   Wt 175 lb (79.4 kg)   SpO2 95%   BMI 32.01 kg/m    Wt Readings from Last 3 Encounters:  10/26/21 175 lb (79.4 kg)  10/26/21 175 lb 3.2 oz (79.5 kg)  09/26/21 175 lb 6.4 oz (79.6 kg)    Physical Exam Vitals and nursing note reviewed.  Constitutional:      General: She is not in acute distress.    Appearance: Normal appearance. She is obese. She is not ill-appearing, toxic-appearing or diaphoretic.  HENT:     Head: Normocephalic and atraumatic.  Eyes:     Pupils: Pupils are equal, round, and reactive to light.  Cardiovascular:     Rate and Rhythm: Normal rate and regular rhythm.     Heart sounds: Normal heart sounds. No murmur heard.    No friction rub. No gallop.  Pulmonary:     Effort: Pulmonary effort is normal. No respiratory distress.     Breath sounds: Normal breath sounds. No wheezing or rhonchi.  Abdominal:     General: Bowel sounds are normal.     Palpations: Abdomen is soft.     Tenderness: There is no abdominal tenderness. There is no right CVA tenderness or left CVA tenderness.  Skin:    General: Skin is warm and dry.     Capillary Refill: Capillary refill takes less than  2 seconds.  Neurological:     General: No focal deficit present.     Mental Status: She is alert and oriented to person, place, and  time.     Gait: Gait abnormal (using cane).  Psychiatric:        Mood and Affect: Mood normal.        Behavior: Behavior normal.        Thought Content: Thought content normal.        Judgment: Judgment normal.     Results for orders placed or performed during the hospital encounter of 07/13/21  Resp Panel by RT-PCR (Flu A&B, Covid) Nasopharyngeal Swab   Specimen: Nasopharyngeal Swab; Nasopharyngeal(NP) swabs in vial transport medium  Result Value Ref Range   SARS Coronavirus 2 by RT PCR NEGATIVE NEGATIVE   Influenza A by PCR NEGATIVE NEGATIVE   Influenza B by PCR NEGATIVE NEGATIVE  NM Myocar Multi W/Spect W/Wall Motion / EF  Result Value Ref Range   Rest HR 68.0 bpm   Rest BP 122/57 mmHg   Peak HR 96 bpm   Peak BP 141/71 mmHg   Rest Nuclear Isotope Dose 10.2 mCi   Stress Nuclear Isotope Dose 29.7 mCi   SSS 12.0    SRS 2.0    SDS 10.0    TID 1.03    LV sys vol 96.0 mL   LV dias vol 152.0 46 - 106 mL   RATE 0.4    Nuc Stress EF 36 %   ST Depression (mm) 0 mm  CBC  Result Value Ref Range   WBC 7.7 4.0 - 10.5 K/uL   RBC 4.11 3.87 - 5.11 MIL/uL   Hemoglobin 10.8 (L) 12.0 - 15.0 g/dL   HCT 34.1 (L) 36.0 - 46.0 %   MCV 83.0 80.0 - 100.0 fL   MCH 26.3 26.0 - 34.0 pg   MCHC 31.7 30.0 - 36.0 g/dL   RDW 15.7 (H) 11.5 - 15.5 %   Platelets 306 150 - 400 K/uL   nRBC 0.0 0.0 - 0.2 %  APTT  Result Value Ref Range   aPTT 30 24 - 36 seconds  Protime-INR  Result Value Ref Range   Prothrombin Time 13.0 11.4 - 15.2 seconds   INR 1.0 0.8 - 1.2  Basic metabolic panel  Result Value Ref Range   Sodium 136 135 - 145 mmol/L   Potassium 4.1 3.5 - 5.1 mmol/L   Chloride 103 98 - 111 mmol/L   CO2 26 22 - 32 mmol/L   Glucose, Bld 94 70 - 99 mg/dL   BUN 14 8 - 23 mg/dL   Creatinine, Ser 0.71 0.44 - 1.00 mg/dL   Calcium 9.3 8.9 - 10.3 mg/dL   GFR, Estimated >60 >60 mL/min   Anion gap 7 5 - 15  Magnesium  Result Value Ref Range   Magnesium 2.4 1.7 - 2.4 mg/dL  Phosphorus  Result  Value Ref Range   Phosphorus 3.6 2.5 - 4.6 mg/dL  TSH  Result Value Ref Range   TSH 1.432 0.350 - 4.500 uIU/mL  Lipid panel  Result Value Ref Range   Cholesterol 216 (H) 0 - 200 mg/dL   Triglycerides 62 <150 mg/dL   HDL 71 >40 mg/dL   Total CHOL/HDL Ratio 3.0 RATIO   VLDL 12 0 - 40 mg/dL   LDL Cholesterol 133 (H) 0 - 99 mg/dL  Basic metabolic panel  Result Value Ref Range   Sodium 139 135 - 145 mmol/L  Potassium 4.2 3.5 - 5.1 mmol/L   Chloride 106 98 - 111 mmol/L   CO2 26 22 - 32 mmol/L   Glucose, Bld 98 70 - 99 mg/dL   BUN 13 8 - 23 mg/dL   Creatinine, Ser 0.63 0.44 - 1.00 mg/dL   Calcium 9.5 8.9 - 10.3 mg/dL   GFR, Estimated >60 >60 mL/min   Anion gap 7 5 - 15  CBC  Result Value Ref Range   WBC 6.2 4.0 - 10.5 K/uL   RBC 4.32 3.87 - 5.11 MIL/uL   Hemoglobin 11.3 (L) 12.0 - 15.0 g/dL   HCT 35.9 (L) 36.0 - 46.0 %   MCV 83.1 80.0 - 100.0 fL   MCH 26.2 26.0 - 34.0 pg   MCHC 31.5 30.0 - 36.0 g/dL   RDW 15.7 (H) 11.5 - 15.5 %   Platelets 316 150 - 400 K/uL   nRBC 0.0 0.0 - 0.2 %  CBG monitoring, ED  Result Value Ref Range   Glucose-Capillary 97 70 - 99 mg/dL  Troponin I (High Sensitivity)  Result Value Ref Range   Troponin I (High Sensitivity) 8 <18 ng/L  Troponin I (High Sensitivity)  Result Value Ref Range   Troponin I (High Sensitivity) 8 <18 ng/L  Troponin I (High Sensitivity)  Result Value Ref Range   Troponin I (High Sensitivity) 9 <18 ng/L       Pertinent labs & imaging results that were available during my care of the patient were reviewed by me and considered in my medical decision making.  Assessment & Plan:  Cadi was seen today for dysuria.  Diagnoses and all orders for this visit:  Dysuria Urinalysis with noted leukocytes in office. Culture pending. -     Urinalysis, Routine w reflex microscopic -     Urine Culture  Acute cystitis without hematuria Urinalysis as noted. No indications of acute pyelonephritis. Will treat with bactrim based off  of previous culture results. Increase water intake and avoid bladder irritants. Culture pending, will change medications if warranted. Report any new, worsening, or persistent symptoms.  -     sulfamethoxazole-trimethoprim (BACTRIM DS) 800-160 MG tablet; Take 1 tablet by mouth 2 (two) times daily for 5 days.     Continue all other maintenance medications.  Follow up plan: Return if symptoms worsen or fail to improve.   Continue healthy lifestyle choices, including diet (rich in fruits, vegetables, and lean proteins, and low in salt and simple carbohydrates) and exercise (at least 30 minutes of moderate physical activity daily).  Educational handout given for UTI  The above assessment and management plan was discussed with the patient. The patient verbalized understanding of and has agreed to the management plan. Patient is aware to call the clinic if they develop any new symptoms or if symptoms persist or worsen. Patient is aware when to return to the clinic for a follow-up visit. Patient educated on when it is appropriate to go to the emergency department.   Monia Pouch, FNP-C [facility] [phone]

## 2021-11-01 LAB — URINE CULTURE

## 2021-11-01 MED ORDER — AMOXICILLIN-POT CLAVULANATE 875-125 MG PO TABS: 1.0000 | ORAL_TABLET | Freq: Two times a day (BID) | ORAL | 0 refills | Status: DC

## 2021-11-01 NOTE — Addendum Note (Signed)
Addended by: Baruch Gouty on: 11/01/2021 01:50 PM   Modules accepted: Orders

## 2021-11-07 ENCOUNTER — Telehealth: Payer: Self-pay | Admitting: Family Medicine

## 2021-11-07 ENCOUNTER — Ambulatory Visit (INDEPENDENT_AMBULATORY_CARE_PROVIDER_SITE_OTHER): Payer: Medicare Other | Admitting: Family

## 2021-11-07 ENCOUNTER — Encounter: Payer: Self-pay | Admitting: Family

## 2021-11-07 VITALS — BP 129/58 | HR 83 | Temp 97.4°F | Ht 62.0 in | Wt 175.4 lb

## 2021-11-07 DIAGNOSIS — I25118 Atherosclerotic heart disease of native coronary artery with other forms of angina pectoris: Secondary | ICD-10-CM | POA: Diagnosis not present

## 2021-11-07 DIAGNOSIS — R102 Pelvic and perineal pain: Secondary | ICD-10-CM | POA: Diagnosis not present

## 2021-11-07 DIAGNOSIS — R300 Dysuria: Secondary | ICD-10-CM

## 2021-11-07 DIAGNOSIS — L258 Unspecified contact dermatitis due to other agents: Secondary | ICD-10-CM

## 2021-11-07 DIAGNOSIS — R32 Unspecified urinary incontinence: Secondary | ICD-10-CM

## 2021-11-07 LAB — URINALYSIS, COMPLETE
Bilirubin, UA: NEGATIVE
Glucose, UA: NEGATIVE
Ketones, UA: NEGATIVE
Leukocytes,UA: NEGATIVE
Nitrite, UA: NEGATIVE
Protein,UA: NEGATIVE
Specific Gravity, UA: 1.01 (ref 1.005–1.030)
Urobilinogen, Ur: 0.2 mg/dL (ref 0.2–1.0)
pH, UA: 6 (ref 5.0–7.5)

## 2021-11-07 LAB — MICROSCOPIC EXAMINATION: Renal Epithel, UA: NONE SEEN /hpf

## 2021-11-07 LAB — WET PREP FOR TRICH, YEAST, CLUE
Clue Cell Exam: NEGATIVE
Trichomonas Exam: NEGATIVE
Yeast Exam: NEGATIVE

## 2021-11-07 MED ORDER — NYSTATIN 100000 UNIT/GM EX POWD: 1.0000 | Freq: Three times a day (TID) | CUTANEOUS | 0 refills | Status: DC

## 2021-11-07 MED ORDER — NYSTATIN 100000 UNIT/GM EX CREA: 1.0000 | TOPICAL_CREAM | Freq: Two times a day (BID) | CUTANEOUS | 2 refills | Status: DC

## 2021-11-07 NOTE — Progress Notes (Signed)
Subjective:    Patient ID: [name], female    DOB: 03-26-1936, 86 y.o.   MRN: [id number]  Chief Complaint  Patient presents with   Vaginal Pain   PT presents to the office today with dysuria and vaginal burning that started 3 days ago and worsening. She was treated for a UTI and currently taking Augmentin.  Vaginal Pain Associated symptoms include dysuria, flank pain, frequency and urgency. Pertinent negatives include no hematuria, nausea or vomiting.  Dysuria  This is a recurrent problem. The current episode started in the past 7 days. The problem has been gradually worsening. The quality of the pain is described as burning. The pain is mild. Associated symptoms include flank pain, frequency and urgency. Pertinent negatives include no hematuria, nausea, possible pregnancy or vomiting. She has tried increased fluids and antibiotics for the symptoms. The treatment provided mild relief.      Review of Systems  Gastrointestinal:  Negative for nausea and vomiting.  Genitourinary:  Positive for dysuria, flank pain, frequency, urgency and vaginal pain. Negative for hematuria.  All other systems reviewed and are negative.      Objective:   Physical Exam Vitals reviewed.  Constitutional:      General: She is not in acute distress.    Appearance: She is well-developed.  HENT:     Head: Normocephalic and atraumatic.     Right Ear: Tympanic membrane normal.     Left Ear: Tympanic membrane normal.  Eyes:     Pupils: Pupils are equal, round, and reactive to light.  Neck:     Thyroid: No thyromegaly.  Cardiovascular:     Rate and Rhythm: Normal rate and regular rhythm.     Heart sounds: Normal heart sounds. No murmur heard. Pulmonary:     Effort: Pulmonary effort is normal. No respiratory distress.     Breath sounds: Normal breath sounds. No wheezing.  Abdominal:     General: Bowel sounds are normal. There is no distension.     Palpations: Abdomen is soft.     Tenderness:  There is no abdominal tenderness.  Genitourinary:    Comments: Vaginal erythemas, no discharge noted Musculoskeletal:        General: No tenderness. Normal range of motion.     Cervical back: Normal range of motion and neck supple.  Skin:    General: Skin is warm and dry.  Neurological:     Mental Status: She is alert and oriented to person, place, and time.     Cranial Nerves: No cranial nerve deficit.     Deep Tendon Reflexes: Reflexes are normal and symmetric.  Psychiatric:        Behavior: Behavior normal.        Thought Content: Thought content normal.        Judgment: Judgment normal.       BP (!) 129/58   Pulse 83   Temp (!) 97.4 F (36.3 C)   Ht 5\' 2"  (1.575 m)   Wt 175 lb 6.4 oz (79.6 kg)   SpO2 95%   BMI 32.08 kg/m      Assessment & Plan:  [name] comes in today with chief complaint of Vaginal Pain   Diagnosis and orders addressed:  1. Dysuria - Urinalysis, Complete - WET PREP FOR TRICH, YEAST, CLUE - Urine Culture  2. Vaginal pain  3. Dermatitis associated with moisture from urinary incontinence Urine and wet prep negative Vagina irritated and redness. Encouraged to get Desitin  to use.  Use nystatin powder and nystatin cream  Avoid sitting in moist  - nystatin (MYCOSTATIN/NYSTOP) powder; Apply 1 Application topically 3 (three) times daily.  Dispense: 15 g; Refill: 0 - nystatin cream (MYCOSTATIN); Apply 1 Application topically 2 (two) times daily.  Dispense: 60 g; Refill: 2   Jannifer Rodney, FNP

## 2021-11-07 NOTE — Telephone Encounter (Signed)
Pt. Needs to be seen for this. Thanks, WS 

## 2021-11-09 DIAGNOSIS — E669 Obesity, unspecified: Secondary | ICD-10-CM | POA: Diagnosis not present

## 2021-11-09 DIAGNOSIS — R5382 Chronic fatigue, unspecified: Secondary | ICD-10-CM | POA: Diagnosis not present

## 2021-11-09 DIAGNOSIS — M5416 Radiculopathy, lumbar region: Secondary | ICD-10-CM | POA: Diagnosis not present

## 2021-11-09 DIAGNOSIS — M2550 Pain in unspecified joint: Secondary | ICD-10-CM | POA: Diagnosis not present

## 2021-11-09 DIAGNOSIS — M25562 Pain in left knee: Secondary | ICD-10-CM | POA: Diagnosis not present

## 2021-11-09 DIAGNOSIS — M1991 Primary osteoarthritis, unspecified site: Secondary | ICD-10-CM | POA: Diagnosis not present

## 2021-11-09 DIAGNOSIS — M0609 Rheumatoid arthritis without rheumatoid factor, multiple sites: Secondary | ICD-10-CM | POA: Diagnosis not present

## 2021-11-09 DIAGNOSIS — G8929 Other chronic pain: Secondary | ICD-10-CM | POA: Diagnosis not present

## 2021-11-09 DIAGNOSIS — Z111 Encounter for screening for respiratory tuberculosis: Secondary | ICD-10-CM | POA: Diagnosis not present

## 2021-11-09 DIAGNOSIS — Z6831 Body mass index (BMI) 31.0-31.9, adult: Secondary | ICD-10-CM | POA: Diagnosis not present

## 2021-11-09 LAB — URINE CULTURE: Organism ID, Bacteria: NO GROWTH

## 2021-11-15 ENCOUNTER — Other Ambulatory Visit: Payer: Self-pay | Admitting: Pulmonary Disease

## 2021-11-24 ENCOUNTER — Encounter: Payer: Self-pay | Admitting: Obstetrics & Gynecology

## 2021-11-24 ENCOUNTER — Ambulatory Visit (INDEPENDENT_AMBULATORY_CARE_PROVIDER_SITE_OTHER): Payer: Medicare Other | Admitting: Obstetrics & Gynecology

## 2021-11-24 VITALS — BP 118/70 | HR 72

## 2021-11-24 DIAGNOSIS — N8110 Cystocele, unspecified: Secondary | ICD-10-CM | POA: Diagnosis not present

## 2021-11-24 DIAGNOSIS — I25118 Atherosclerotic heart disease of native coronary artery with other forms of angina pectoris: Secondary | ICD-10-CM

## 2021-11-24 DIAGNOSIS — R350 Frequency of micturition: Secondary | ICD-10-CM | POA: Diagnosis not present

## 2021-11-24 DIAGNOSIS — F419 Anxiety disorder, unspecified: Secondary | ICD-10-CM | POA: Diagnosis not present

## 2021-11-24 DIAGNOSIS — R4189 Other symptoms and signs involving cognitive functions and awareness: Secondary | ICD-10-CM

## 2021-11-24 DIAGNOSIS — R32 Unspecified urinary incontinence: Secondary | ICD-10-CM

## 2021-11-24 LAB — POCT URINALYSIS DIPSTICK OB
Blood, UA: NEGATIVE
Glucose, UA: NEGATIVE
Ketones, UA: NEGATIVE
Nitrite, UA: NEGATIVE
POC,PROTEIN,UA: NEGATIVE

## 2021-11-24 NOTE — Progress Notes (Signed)
GYN VISIT Patient name: [name] MRN [id number]  Date of birth: [birth year] Chief Complaint:   Urinary Frequency and Vaginal Pain  History of Present Illness:   [name] is a 86 y.o. G3P3002 PM, [place] female being seen today for the following concerns:  Urinary issues: This has been an ongoing issue- in review of her records- seen in Dec 2022 at that time she was diagnosed with a UTI.  Per pt, it "slowed" her down, but she is still having the same concerns.  Reports leaking urine all the time.  Both stress and urge incontinence and sometimes even when she is just sitting.  Pt became tearful as she is concerned that something is wrong with her. +nocturia, frequency. Denies dysuria or hematuria.  Denies pelvic or abdominal pain.    No LMP recorded. Patient has had a hysterectomy.     10/26/2021    2:11 PM 09/26/2021    2:31 PM 09/26/2021    2:25 PM 09/11/2021    5:22 PM 09/11/2021   11:06 AM  Depression screen PHQ 2/9  Decreased Interest 0 0 0 1 0  Down, Depressed, Hopeless 1 0 0 1 0  PHQ - 2 Score 1 0 0 2 0  Altered sleeping 1   1   Tired, decreased energy 0   1   Change in appetite 1   1   Feeling bad or failure about yourself  0   1   Trouble concentrating 0   1   Moving slowly or fidgety/restless 1   1   Suicidal thoughts 0   0   PHQ-9 Score 4   8   Difficult doing work/chores Not difficult at all   Not difficult at all      Review of Systems:   Pertinent items are noted in HPI Denies fever/chills, dizziness, headaches, visual disturbances, fatigue, shortness of breath, chest pain, abdominal pain, vomiting. Pertinent History Reviewed:  Reviewed past medical,surgical, social, obstetrical and family history.  Reviewed problem list, medications and allergies. Physical Assessment:   Vitals:   11/24/21 1040  BP: 118/70  Pulse: 72  There is no height or weight on file to calculate BMI.       Physical Examination:   General appearance: alert, well appearing, and in no  distress  Psych: mood appropriate, normal affect  Skin: warm & dry   Cardiovascular: normal heart rate noted  Respiratory: normal respiratory effort, no distress  Abdomen: soft, non-tender   Pelvic: VULVA: normal appearing vulva with no masses, tenderness or lesions, VAGINA: normal appearing vagina with normal color and discharge, no lesions.  Stage 2 cystocele noted.  Uterus and cervix surgically absent.  No masses or tenderness appreciated on bimanual exam.  Extremities: no edema   Chaperone: Latisha Cresenzo    PESSARY FITTING -pessary trial reviewed, discussed pros/cons of device -discussed different types -pt fitting for ring- notes no issues with first device -sample pessary removed and Milex Ring #3 placed   Assessment & Plan:  1) Urinary incontinence, Cystocele -reviewed findings with patient -pessary fitting completed and fit with Milex Ring #3 -f/u in 4 weeks -discussed medical management- will consider starting anti-cholinergics pending symptoms at next visit -plan to also r/o underlying infection  2) anxiety -reassured pt regarding findings -discussed importance of improving quality -questions/concerns were addressed   Orders Placed This Encounter  Procedures   Urine Culture   POC Urinalysis Dipstick OB    Return in about 4 weeks (around  12/22/2021) for pessary follow up- [name].   [name] Pupa, DO Attending [place], [facility] for [employer], [place]

## 2021-11-27 ENCOUNTER — Other Ambulatory Visit: Payer: Self-pay | Admitting: Obstetrics & Gynecology

## 2021-11-27 DIAGNOSIS — N3000 Acute cystitis without hematuria: Secondary | ICD-10-CM

## 2021-11-27 DIAGNOSIS — N3001 Acute cystitis with hematuria: Secondary | ICD-10-CM

## 2021-11-27 MED ORDER — CEFADROXIL 500 MG PO CAPS: 500.0000 mg | ORAL_CAPSULE | Freq: Two times a day (BID) | ORAL | 0 refills | Status: AC

## 2021-11-29 ENCOUNTER — Ambulatory Visit (INDEPENDENT_AMBULATORY_CARE_PROVIDER_SITE_OTHER): Payer: Medicare Other | Admitting: Obstetrics & Gynecology

## 2021-11-29 ENCOUNTER — Encounter: Payer: Self-pay | Admitting: Obstetrics & Gynecology

## 2021-11-29 VITALS — Ht 62.0 in | Wt 175.0 lb

## 2021-11-29 DIAGNOSIS — N8110 Cystocele, unspecified: Secondary | ICD-10-CM | POA: Diagnosis not present

## 2021-11-29 DIAGNOSIS — R32 Unspecified urinary incontinence: Secondary | ICD-10-CM

## 2021-11-29 DIAGNOSIS — N3000 Acute cystitis without hematuria: Secondary | ICD-10-CM

## 2021-11-29 LAB — URINE CULTURE

## 2021-11-29 NOTE — Progress Notes (Signed)
   GYN VISIT Patient name: [name] MRN [id number]  Date of birth: 10-07-1935 Chief Complaint:   wants pessary removed  History of Present Illness:   [name] is a 86 y.o. G3P3002 PM, [place] female being seen today for pessary follow up.  Device was placed earlier this week due to urinary continence and prolapse.  Initially she had no problems with device during her visit and fitting.  Now,she is reporting pain and discomfort and wants the device removed.  She also feels as though it is not helping with her urinary symptoms.  Of note, pt has another UTI.  We had tried to call her, but did not get ahold of her until recently.  She started treatment yesterday.  In review, of her records this is more than her 3rd UTI in the past year.  During her visit, there appeared to be some confusion as she used improper words and story did not quite seem cohesive.  No LMP recorded. Patient has had a hysterectomy.     10/26/2021    2:11 PM 09/26/2021    2:31 PM 09/26/2021    2:25 PM 09/11/2021    5:22 PM 09/11/2021   11:06 AM  Depression screen PHQ 2/9  Decreased Interest 0 0 0 1 0  Down, Depressed, Hopeless 1 0 0 1 0  PHQ - 2 Score 1 0 0 2 0  Altered sleeping 1   1   Tired, decreased energy 0   1   Change in appetite 1   1   Feeling bad or failure about yourself  0   1   Trouble concentrating 0   1   Moving slowly or fidgety/restless 1   1   Suicidal thoughts 0   0   PHQ-9 Score 4   8   Difficult doing work/chores Not difficult at all   Not difficult at all      Review of Systems:   Pertinent items are noted in HPI Denies fever/chills, dizziness, headaches, visual disturbances, fatigue, shortness of breath, chest pain, abdominal pain, vomiting, denies problems with bowel movements, urination, or intercourse unless otherwise stated above.  Pertinent History Reviewed:  Reviewed past medical,surgical, social, obstetrical and family history.  Reviewed problem list, medications and  allergies. Physical Assessment:   Vitals:   11/29/21 1212  Weight: 175 lb (79.4 kg)  Height: '5\' 2"'$  (1.575 m)  Body mass index is 32.01 kg/m.       Physical Examination:   General appearance: alert, well appearing, and in no distress  Psych: mood appropriate, normal affect  Skin: warm & dry   Cardiovascular: normal heart rate noted  Respiratory: normal respiratory effort, no distress  Abdomen: soft, non-tender   Pelvic: normal external genitalia, vulva- pessary grasped and removed without difficulty   Chaperone: Celene Squibb    Assessment & Plan:  1) Recurrent UTI -plan to complete antibiotic and then return to office for TOC urine culture -if negative, will plan to start on daily suppression therapy  2) Urinary incontinence -while I do think she has some baseline leakage, suspect the recurrent UTIs are contributing to her symptoms -once on suppression therapy will re-evaluate and pt reported that she has plans to see a urologist -pessary removed without difficulty  3) ?Dementia -will plan to discuss with PCP regarding mental status    Janyth Pupa, DO Attending [place] [employer], [place]

## 2021-12-05 ENCOUNTER — Other Ambulatory Visit: Payer: Self-pay | Admitting: Cardiovascular Disease

## 2021-12-05 DIAGNOSIS — M545 Low back pain, unspecified: Secondary | ICD-10-CM | POA: Insufficient documentation

## 2021-12-05 DIAGNOSIS — M5451 Vertebrogenic low back pain: Secondary | ICD-10-CM | POA: Diagnosis not present

## 2021-12-06 ENCOUNTER — Telehealth: Payer: Self-pay | Admitting: Family Medicine

## 2021-12-06 NOTE — Telephone Encounter (Signed)
Patient stated that she had an x ray but no one has called her with the results. Asked patient if she was referring to the DEXA scan that was done on 5/16 and she was unsure. Would like to talk to someone to clarify. Please call back.

## 2021-12-07 NOTE — Telephone Encounter (Signed)
Called patient, no answer, left message to return call 

## 2021-12-11 NOTE — Telephone Encounter (Signed)
Patient aware she had bone density scan and coming for office visit in a couple of weeks and wants copy of result.

## 2021-12-13 ENCOUNTER — Other Ambulatory Visit (INDEPENDENT_AMBULATORY_CARE_PROVIDER_SITE_OTHER): Payer: Medicare Other | Admitting: *Deleted

## 2021-12-13 DIAGNOSIS — R8290 Unspecified abnormal findings in urine: Secondary | ICD-10-CM

## 2021-12-13 DIAGNOSIS — Z87440 Personal history of urinary (tract) infections: Secondary | ICD-10-CM

## 2021-12-13 NOTE — Progress Notes (Signed)
   NURSE VISIT- UTI SYMPTOMS   SUBJECTIVE:  [name] is a 86 y.o. G4P3002 female here for UTI symptoms. She is a GYN patient. She reports  follow up from recent UTI .  OBJECTIVE:  There were no vitals taken for this visit.  Appears well, in no apparent distress  No results found for this or any previous visit (from the past 24 hour(s)).  ASSESSMENT: GYN patient with UTI symptoms   PLAN: Note routed to Derrek Monaco, AGNP   Rx sent by provider today: No Urine culture sent Call or return to clinic prn if these symptoms worsen or fail to improve as anticipated. Follow-up: as needed   [name]  12/13/2021 12:08 PM

## 2021-12-14 LAB — URINALYSIS, ROUTINE W REFLEX MICROSCOPIC
Bilirubin, UA: NEGATIVE
Glucose, UA: NEGATIVE
Ketones, UA: NEGATIVE
Leukocytes,UA: NEGATIVE
Nitrite, UA: NEGATIVE
Protein,UA: NEGATIVE
RBC, UA: NEGATIVE
Specific Gravity, UA: 1.007 (ref 1.005–1.030)
Urobilinogen, Ur: 0.2 mg/dL (ref 0.2–1.0)
pH, UA: 5.5 (ref 5.0–7.5)

## 2021-12-15 LAB — URINE CULTURE

## 2021-12-22 ENCOUNTER — Ambulatory Visit: Payer: Medicare Other | Admitting: Obstetrics & Gynecology

## 2021-12-28 ENCOUNTER — Encounter: Payer: Self-pay | Admitting: Family Medicine

## 2021-12-28 ENCOUNTER — Ambulatory Visit (INDEPENDENT_AMBULATORY_CARE_PROVIDER_SITE_OTHER): Payer: Medicare Other | Admitting: Family Medicine

## 2021-12-28 VITALS — BP 133/63 | HR 86 | Temp 97.8°F | Ht 62.0 in | Wt 174.6 lb

## 2021-12-28 DIAGNOSIS — R7303 Prediabetes: Secondary | ICD-10-CM | POA: Diagnosis not present

## 2021-12-28 DIAGNOSIS — I25118 Atherosclerotic heart disease of native coronary artery with other forms of angina pectoris: Secondary | ICD-10-CM | POA: Diagnosis not present

## 2021-12-28 DIAGNOSIS — E7800 Pure hypercholesterolemia, unspecified: Secondary | ICD-10-CM | POA: Diagnosis not present

## 2021-12-28 DIAGNOSIS — I10 Essential (primary) hypertension: Secondary | ICD-10-CM | POA: Diagnosis not present

## 2021-12-28 DIAGNOSIS — M0609 Rheumatoid arthritis without rheumatoid factor, multiple sites: Secondary | ICD-10-CM

## 2021-12-28 LAB — BAYER DCA HB A1C WAIVED: HB A1C (BAYER DCA - WAIVED): 6 % — ABNORMAL HIGH (ref 4.8–5.6)

## 2021-12-28 MED ORDER — LEFLUNOMIDE 20 MG PO TABS: 20.0000 mg | ORAL_TABLET | Freq: Every day | ORAL | 3 refills | Status: DC

## 2021-12-28 MED ORDER — CELECOXIB 200 MG PO CAPS: 200.0000 mg | ORAL_CAPSULE | Freq: Every day | ORAL | 5 refills | Status: DC

## 2021-12-28 NOTE — Progress Notes (Signed)
Subjective:  Patient ID: [name], female    DOB: [birth year]  Age: 86 y.o. MRN: [id number]  CC: Medical Management of Chronic Issues   HPI [name] presents for "I'm just giving out." Wants arthritis meds. Has to go to [facility] to get her [place]. Wants me to fill them because she hates to travel to [place]. Has painn in hands. All over too.    presents for  follow-up of hypertension. Patient has no history of headache chest pain or shortness of breath or recent cough. Patient also denies symptoms of TIA such as focal numbness or weakness. Patient denies side effects from medication. States taking it regularly.   in for follow-up of elevated cholesterol. Not taking statin, but treated for CHF with entresto, carvedilol. Currently not dyspneic. Not edematous.     12/28/2021    1:17 PM 12/28/2021    1:01 PM 10/26/2021    2:11 PM  Depression screen PHQ 2/9  Decreased Interest 2 0 0  Down, Depressed, Hopeless 3 0 1  PHQ - 2 Score 5 0 1  Altered sleeping 3  1  Tired, decreased energy 2  0  Change in appetite 0  1  Feeling bad or failure about yourself  2  0  Trouble concentrating 3  0  Moving slowly or fidgety/restless 3  1  Suicidal thoughts 0  0  PHQ-9 Score 18  4  Difficult doing work/chores Not difficult at all  Not difficult at all    History [name] has a past medical history of Acute on chronic respiratory failure with hypoxia ([place]) (04/24/2018), Anxiety, Atypical chest pain, Bronchospasm, CAD (coronary artery disease), Cervical disc disorder with myelopathy, unspecified cervical region, Cervicalgia (01/19/2009), Chest pain at rest (4/33/2951), Chronic systolic (congestive) heart failure ([place]), COPD (chronic obstructive pulmonary disease) ([place]), Coronary artery disease, De Quervain's disease (tenosynovitis) (04/02/2011), Disc disease with myelopathy, cervical, Essential hypertension, Hemorrhoids, Liver mass, Lung, cysts, congenital, Myocardial infarction ([place]),  Nephrolithiasis, Nonischemic cardiomyopathy ([place]), On home O2, Osteoarthritis, Sprain of wrist (08/07/2012), and Thoracic ascending aortic aneurysm ([place]).   She has a past surgical history that includes Tonsillectomy and adenoidectomy; Complete hysterectomy; Benign breast tumors; Cholecystectomy; Colonoscopy; Colonoscopy (N/A, 09/22/2014); LEFT HEART CATH AND CORONARY ANGIOGRAPHY (N/A, 11/23/2016); and CORONARY STENT INTERVENTION (N/A, 11/23/2016).   Her family history includes Aneurysm in her father; Diabetes in her brother; Heart disease in her brother, mother, and sister; Lung cancer in her brother.She reports that she quit smoking about 13 years ago. Her smoking use included cigarettes. She started smoking about 44 years ago. She has a 0.75 pack-year smoking history. She has never used smokeless tobacco. She reports that she does not drink alcohol and does not use drugs.    ROS Review of Systems  Constitutional: Negative.   HENT: Negative.    Eyes:  Negative for visual disturbance.  Respiratory:  Negative for shortness of breath.   Cardiovascular:  Negative for chest pain.  Gastrointestinal:  Negative for abdominal pain.  Musculoskeletal:  Negative for arthralgias.    Objective:  BP 133/63   Pulse 86   Temp 97.8 F (36.6 C)   Ht 5' 2" (1.575 m)   Wt 174 lb 9.6 oz (79.2 kg)   SpO2 94%   BMI 31.93 kg/m   BP Readings from Last 3 Encounters:  12/28/21 133/63  11/24/21 118/70  11/07/21 (!) 129/58    Wt Readings from Last 3 Encounters:  12/28/21 174 lb 9.6 oz (79.2 kg)  11/29/21  175 lb (79.4 kg)  11/07/21 175 lb 6.4 oz (79.6 kg)     Physical Exam Constitutional:      General: She is not in acute distress.    Appearance: She is well-developed.  Cardiovascular:     Rate and Rhythm: Normal rate and regular rhythm.  Pulmonary:     Breath sounds: Normal breath sounds.  Musculoskeletal:        General: Deformity ([place]'s nodes) present. Normal range of motion.  Skin:     General: Skin is warm and dry.  Neurological:     Mental Status: She is alert and oriented to person, place, and time.       Assessment & Plan:   [name] was seen today for medical management of chronic issues.  Diagnoses and all orders for this visit:  Essential hypertension -     CMP14+EGFR -     CBC with Differential/Platelet  Pure hypercholesterolemia -     Lipid panel  Prediabetes -     Bayer DCA Hb A1c Waived  Rheumatoid arthritis of multiple sites without rheumatoid factor ([place])  Other orders -     leflunomide (ARAVA) 20 MG tablet; Take 1 tablet (20 mg total) by mouth daily. -     celecoxib (CELEBREX) 200 MG capsule; Take 1 capsule (200 mg total) by mouth daily. With food       I have discontinued [name]. [name]'s traMADol and calcium-vitamin D. I have also changed her leflunomide. Additionally, I am having her start on celecoxib. Lastly, I am having her maintain her aspirin EC, OXYGEN, nitroGLYCERIN, meclizine, vitamin C, Vitamin D3, Cranberry, isosorbide mononitrate, clorazepate, alendronate, Vitamin D, Entresto, nystatin, nystatin cream, Trelegy Ellipta, Ventolin HFA, and carvedilol.  Allergies as of 12/28/2021       Reactions   Plavix [clopidogrel Bisulfate] Itching   Severe itching   Alprazolam Nausea And Vomiting   Codeine Nausea And Vomiting   Percodan [oxycodone-aspirin] Nausea And Vomiting   Valium Nausea And Vomiting        Medication List        Accurate as of December 28, 2021 11:59 PM. If you have any questions, ask your nurse or doctor.          STOP taking these medications    calcium-vitamin D 500-5 MG-MCG tablet Commonly known as: OSCAL WITH D Stopped by: Claretta Fraise, MD   traMADol 50 MG tablet Commonly known as: ULTRAM Stopped by: Claretta Fraise, MD       TAKE these medications    alendronate 70 MG tablet Commonly known as: FOSAMAX Take 1 tablet (70 mg total) by mouth every 7 (seven) days. Take with a full glass of  water on an empty stomach. Do not lay down for at least 2 hours   aspirin EC 81 MG tablet Take 81 mg by mouth every morning.   carvedilol 6.25 MG tablet Commonly known as: COREG TAKE 1 TABLET BY MOUTH TWICE DAILY WITH MEALS.   celecoxib 200 MG capsule Commonly known as: CeleBREX Take 1 capsule (200 mg total) by mouth daily. With food Started by: Claretta Fraise, MD   clorazepate 7.5 MG tablet Commonly known as: TRANXENE Take 1 tablet (7.5 mg total) by mouth daily as needed for anxiety.   Cranberry 500 MG Tabs Take 1 tablet by mouth daily.   Entresto 24-26 MG Generic drug: sacubitril-valsartan Take 1 tablet by mouth 2 (two) times daily.   isosorbide mononitrate 30 MG 24 hr tablet Commonly known as: IMDUR  Take 0.5 tablets (15 mg total) by mouth daily.   leflunomide 20 MG tablet Commonly known as: ARAVA Take 1 tablet (20 mg total) by mouth daily.   meclizine 25 MG tablet Commonly known as: ANTIVERT Take 1 tablet (25 mg total) by mouth as needed for dizziness.   nitroGLYCERIN 0.4 MG SL tablet Commonly known as: NITROSTAT Place 1 tablet (0.4 mg total) under the tongue every 5 (five) minutes x 3 doses as needed for chest pain.   nystatin powder Commonly known as: MYCOSTATIN/NYSTOP Apply 1 Application topically 3 (three) times daily.   nystatin cream Commonly known as: MYCOSTATIN Apply 1 Application topically 2 (two) times daily.   OXYGEN Inhale 2 L into the lungs at bedtime.   Trelegy Ellipta 100-62.5-25 MCG/ACT Aepb Generic drug: Fluticasone-Umeclidin-Vilant INHALE (1) PUFF INTO THE LUNGS DAILY   Ventolin HFA 108 (90 Base) MCG/ACT inhaler Generic drug: albuterol INHALE 2 PUFFS INTO THE LUNGS EVERY 4 HOURS AS NEEDED.   vitamin C 100 MG tablet Take 100 mg by mouth daily.   Vitamin D3 10 MCG (400 UNIT) Caps Take 1 capsule by mouth daily.   Vitamin D 50 MCG (2000 UT) tablet Take 1 tablet (2,000 Units total) by mouth daily.         Follow-up: Return in  about 6 weeks (around 02/08/2022).  Claretta Fraise, M.D.

## 2021-12-29 LAB — CBC WITH DIFFERENTIAL/PLATELET
Basophils Absolute: 0.1 10*3/uL (ref 0.0–0.2)
Basos: 1 %
EOS (ABSOLUTE): 0.4 10*3/uL (ref 0.0–0.4)
Eos: 6 %
Hematocrit: 36 % (ref 34.0–46.6)
Hemoglobin: 11.7 g/dL (ref 11.1–15.9)
Immature Grans (Abs): 0 10*3/uL (ref 0.0–0.1)
Immature Granulocytes: 0 %
Lymphocytes Absolute: 2 10*3/uL (ref 0.7–3.1)
Lymphs: 34 %
MCH: 26.4 pg — ABNORMAL LOW (ref 26.6–33.0)
MCHC: 32.5 g/dL (ref 31.5–35.7)
MCV: 81 fL (ref 79–97)
Monocytes Absolute: 0.8 10*3/uL (ref 0.1–0.9)
Monocytes: 12 %
Neutrophils Absolute: 2.9 10*3/uL (ref 1.4–7.0)
Neutrophils: 47 %
Platelets: 263 10*3/uL (ref 150–450)
RBC: 4.44 x10E6/uL (ref 3.77–5.28)
RDW: 15 % (ref 11.7–15.4)
WBC: 6.1 10*3/uL (ref 3.4–10.8)

## 2021-12-29 LAB — LIPID PANEL
Chol/HDL Ratio: 2.5 ratio (ref 0.0–4.4)
Cholesterol, Total: 217 mg/dL — ABNORMAL HIGH (ref 100–199)
HDL: 86 mg/dL (ref >39)
LDL Chol Calc (NIH): 116 mg/dL — ABNORMAL HIGH (ref 0–99)
Triglycerides: 85 mg/dL (ref 0–149)
VLDL Cholesterol Cal: 15 mg/dL (ref 5–40)

## 2021-12-29 LAB — CMP14+EGFR
ALT: 9 IU/L (ref 0–32)
AST: 16 IU/L (ref 0–40)
Albumin/Globulin Ratio: 2 (ref 1.2–2.2)
Albumin: 4 g/dL (ref 3.7–4.7)
Alkaline Phosphatase: 66 IU/L (ref 44–121)
BUN/Creatinine Ratio: 16 (ref 12–28)
BUN: 12 mg/dL (ref 8–27)
Bilirubin Total: 0.4 mg/dL (ref 0.0–1.2)
CO2: 25 mmol/L (ref 20–29)
Calcium: 9.9 mg/dL (ref 8.7–10.3)
Chloride: 106 mmol/L (ref 96–106)
Creatinine, Ser: 0.73 mg/dL (ref 0.57–1.00)
Globulin, Total: 2 g/dL (ref 1.5–4.5)
Glucose: 162 mg/dL — ABNORMAL HIGH (ref 70–99)
Potassium: 4.2 mmol/L (ref 3.5–5.2)
Sodium: 142 mmol/L (ref 134–144)
Total Protein: 6 g/dL (ref 6.0–8.5)
eGFR: 81 mL/min/{1.73_m2} (ref >59)

## 2021-12-31 ENCOUNTER — Encounter: Payer: Self-pay | Admitting: Family Medicine

## 2022-01-08 ENCOUNTER — Telehealth: Payer: Self-pay | Admitting: *Deleted

## 2022-01-08 NOTE — Patient Outreach (Signed)
  Care Coordination   Initial Visit Note   01/08/2022 Name: [name] MRN: [id number] DOB: 12-28-1935  [name] is a 86 y.o. year old female who sees Stacks, Cletus Gash, MD for primary care. I spoke with  [name] by phone today.  What matters to the patients health and wellness today?  Patient states she needs a home aide to assist her with ADLs.    Goals Addressed               This Visit's Progress     Patient Stated     Patient Stated (pt-stated)        Patient states she needs a home aide to assist her at home. It has become difficult for her to perform ADLs.         SDOH assessments and interventions completed:  Yes     Care Coordination Interventions Activated:  Yes  Care Coordination Interventions:  Yes, provided   Follow up plan: Follow up call scheduled for 01/24/22    Encounter Outcome:  Pt. Visit Completed   [name] Loron RN, BSN [place] [phone] [email or web address]

## 2022-01-24 ENCOUNTER — Observation Stay (HOSPITAL_COMMUNITY)
Admission: EM | Admit: 2022-01-24 | Discharge: 2022-01-26 | Disposition: A | Discharge status: Home / Self Care | Payer: Medicare Other | Attending: Family Medicine | Admitting: Family Medicine

## 2022-01-24 ENCOUNTER — Encounter: Payer: Self-pay | Admitting: *Deleted

## 2022-01-24 ENCOUNTER — Other Ambulatory Visit: Payer: Self-pay

## 2022-01-24 ENCOUNTER — Emergency Department (HOSPITAL_COMMUNITY): Payer: Medicare Other

## 2022-01-24 ENCOUNTER — Encounter (HOSPITAL_COMMUNITY): Payer: Self-pay | Admitting: *Deleted

## 2022-01-24 ENCOUNTER — Ambulatory Visit: Payer: Self-pay | Admitting: *Deleted

## 2022-01-24 DIAGNOSIS — R490 Dysphonia: Secondary | ICD-10-CM | POA: Diagnosis not present

## 2022-01-24 DIAGNOSIS — R221 Localized swelling, mass and lump, neck: Secondary | ICD-10-CM | POA: Diagnosis not present

## 2022-01-24 DIAGNOSIS — I10 Essential (primary) hypertension: Secondary | ICD-10-CM | POA: Diagnosis present

## 2022-01-24 DIAGNOSIS — J449 Chronic obstructive pulmonary disease, unspecified: Secondary | ICD-10-CM | POA: Diagnosis present

## 2022-01-24 DIAGNOSIS — I5042 Chronic combined systolic (congestive) and diastolic (congestive) heart failure: Secondary | ICD-10-CM | POA: Diagnosis present

## 2022-01-24 DIAGNOSIS — Z7982 Long term (current) use of aspirin: Secondary | ICD-10-CM | POA: Diagnosis not present

## 2022-01-24 DIAGNOSIS — J384 Edema of larynx: Secondary | ICD-10-CM | POA: Diagnosis not present

## 2022-01-24 DIAGNOSIS — I2510 Atherosclerotic heart disease of native coronary artery without angina pectoris: Secondary | ICD-10-CM | POA: Insufficient documentation

## 2022-01-24 DIAGNOSIS — I110 Hypertensive heart disease with heart failure: Secondary | ICD-10-CM | POA: Diagnosis not present

## 2022-01-24 DIAGNOSIS — R55 Syncope and collapse: Principal | ICD-10-CM

## 2022-01-24 DIAGNOSIS — Z79899 Other long term (current) drug therapy: Secondary | ICD-10-CM | POA: Diagnosis not present

## 2022-01-24 DIAGNOSIS — J387 Other diseases of larynx: Secondary | ICD-10-CM | POA: Diagnosis not present

## 2022-01-24 DIAGNOSIS — Z955 Presence of coronary angioplasty implant and graft: Secondary | ICD-10-CM | POA: Diagnosis not present

## 2022-01-24 DIAGNOSIS — I959 Hypotension, unspecified: Secondary | ICD-10-CM | POA: Diagnosis not present

## 2022-01-24 DIAGNOSIS — R0789 Other chest pain: Secondary | ICD-10-CM | POA: Diagnosis not present

## 2022-01-24 DIAGNOSIS — Z87891 Personal history of nicotine dependence: Secondary | ICD-10-CM | POA: Insufficient documentation

## 2022-01-24 DIAGNOSIS — J392 Other diseases of pharynx: Secondary | ICD-10-CM | POA: Diagnosis not present

## 2022-01-24 DIAGNOSIS — K222 Esophageal obstruction: Secondary | ICD-10-CM

## 2022-01-24 DIAGNOSIS — R52 Pain, unspecified: Secondary | ICD-10-CM | POA: Diagnosis not present

## 2022-01-24 DIAGNOSIS — R079 Chest pain, unspecified: Secondary | ICD-10-CM | POA: Diagnosis not present

## 2022-01-24 DIAGNOSIS — I447 Left bundle-branch block, unspecified: Secondary | ICD-10-CM | POA: Diagnosis not present

## 2022-01-24 LAB — CBC
HCT: 36.6 % (ref 36.0–46.0)
Hemoglobin: 11.7 g/dL — ABNORMAL LOW (ref 12.0–15.0)
MCH: 26.8 pg (ref 26.0–34.0)
MCHC: 32 g/dL (ref 30.0–36.0)
MCV: 83.9 fL (ref 80.0–100.0)
Platelets: 274 10*3/uL (ref 150–400)
RBC: 4.36 MIL/uL (ref 3.87–5.11)
RDW: 15.4 % (ref 11.5–15.5)
WBC: 5.4 10*3/uL (ref 4.0–10.5)
nRBC: 0 % (ref 0.0–0.2)

## 2022-01-24 LAB — BASIC METABOLIC PANEL
Anion gap: 9 (ref 5–15)
BUN: 12 mg/dL (ref 8–23)
CO2: 25 mmol/L (ref 22–32)
Calcium: 9.5 mg/dL (ref 8.9–10.3)
Chloride: 107 mmol/L (ref 98–111)
Creatinine, Ser: 0.67 mg/dL (ref 0.44–1.00)
GFR, Estimated: >60 mL/min (ref >60)
Glucose, Bld: 103 mg/dL — ABNORMAL HIGH (ref 70–99)
Potassium: 4.1 mmol/L (ref 3.5–5.1)
Sodium: 141 mmol/L (ref 135–145)

## 2022-01-24 LAB — TROPONIN I (HIGH SENSITIVITY)
Troponin I (High Sensitivity): 11 ng/L (ref <18)
Troponin I (High Sensitivity): 10 ng/L (ref <18)

## 2022-01-24 LAB — TSH: TSH: 0.682 u[IU]/mL (ref 0.350–4.500)

## 2022-01-24 MED ORDER — IOHEXOL 350 MG/ML SOLN
100.0000 mL | Freq: Once | INTRAVENOUS | Status: AC | PRN
  Administered 2022-01-24: 100 mL via INTRAVENOUS

## 2022-01-24 MED ORDER — LACTATED RINGERS IV BOLUS
500.0000 mL | Freq: Once | INTRAVENOUS | Status: AC
  Administered 2022-01-24: 500 mL via INTRAVENOUS

## 2022-01-24 MED ORDER — DEXAMETHASONE SODIUM PHOSPHATE 10 MG/ML IJ SOLN
10.0000 mg | Freq: Once | INTRAMUSCULAR | Status: AC
  Administered 2022-01-24: 10 mg via INTRAVENOUS
  Filled 2022-01-24: qty 1

## 2022-01-24 NOTE — ED Triage Notes (Signed)
Pt BIB [facility] for CP and near syncopal episode this morning.  Pt wears home O2 at night but put it on this morning for her CP.  EMS started 20G IV to right hand and reported pt with BP 120/64 and HR at 75.  Pt with chest tightness that's intermittent, denies any pain at present. Pt reported hx of bronchitis this past week.

## 2022-01-24 NOTE — ED Provider Notes (Signed)
[facility] Provider Note   CSN: [id number] Arrival date & time: 01/24/22  1232     History  Chief Complaint  Patient presents with   Chest Pain    [name] is a 86 y.o. female.   Chest Pain Associated symptoms: fatigue   Patient presents for a near syncopal episode.  Medical history includes GERD, CHF, HTN, anxiety, LBBB, CAD, COPD, HLD, rheumatoid arthritis.  She has had voice hoarseness over the past week.  She has otherwise been in her normal state of health up until this morning.  This morning, she had an episode where she felt lightheaded.  She checked her heart rate and found to be 48 bpm.  Her SPO2 was in the range of 80% on room air.  At baseline, patient wears oxygen supplementation at night but not during the day.  This near-syncopal episode was transient.  Patient currently endorses continued hoarseness of voice and a sensation of throat tightness but denies any other current symptoms at rest.  She did take her morning medications.  This was approximately 2 hours prior to her near syncopal episode.     Home Medications Prior to Admission medications   Medication Sig Start Date End Date Taking? Authorizing Provider  albuterol (VENTOLIN HFA) 108 (90 Base) MCG/ACT inhaler INHALE 2 PUFFS INTO THE LUNGS EVERY 4 HOURS AS NEEDED. Patient taking differently: Inhale 2 puffs into the lungs every 4 (four) hours as needed for wheezing or shortness of breath. 11/15/21  Yes Chesley Mires, MD  alendronate (FOSAMAX) 70 MG tablet Take 1 tablet (70 mg total) by mouth every 7 (seven) days. Take with a full glass of water on an empty stomach. Do not lay down for at least 2 hours 09/26/21  Yes Stacks, Cletus Gash, MD  Ascorbic Acid (VITAMIN C) 100 MG tablet Take 100 mg by mouth daily.   Yes [provider]  aspirin EC 81 MG tablet Take 81 mg by mouth every morning.    Yes [provider]  carvedilol (COREG) 6.25 MG tablet TAKE 1 TABLET BY MOUTH TWICE DAILY  WITH MEALS. 12/05/21  Yes Josue Hector, MD  Cholecalciferol (VITAMIN D) 50 MCG (2000 UT) tablet Take 1 tablet (2,000 Units total) by mouth daily. 09/27/21  Yes Stacks, Cletus Gash, MD  clorazepate (TRANXENE) 7.5 MG tablet Take 1 tablet (7.5 mg total) by mouth daily as needed for anxiety. 09/26/21  Yes Stacks, Cletus Gash, MD  Cranberry 500 MG TABS Take 1 tablet by mouth daily.   Yes [provider]  ENTRESTO 24-26 MG Take 1 tablet by mouth 2 (two) times daily. 10/02/21  Yes [provider]  Fluticasone-Umeclidin-Vilant (TRELEGY ELLIPTA) 100-62.5-25 MCG/ACT AEPB INHALE (1) PUFF INTO THE LUNGS DAILY Patient taking differently: Inhale 1 puff into the lungs daily. 11/15/21  Yes Chesley Mires, MD  isosorbide mononitrate (IMDUR) 30 MG 24 hr tablet Take 0.5 tablets (15 mg total) by mouth daily. 07/15/21  Yes Barton Dubois, MD  leflunomide (ARAVA) 20 MG tablet Take 1 tablet (20 mg total) by mouth daily. 12/28/21  Yes Stacks, Cletus Gash, MD  meclizine (ANTIVERT) 25 MG tablet Take 1 tablet (25 mg total) by mouth as needed for dizziness. 04/19/21  Yes Josue Hector, MD  nitroGLYCERIN (NITROSTAT) 0.4 MG SL tablet Place 1 tablet (0.4 mg total) under the tongue every 5 (five) minutes x 3 doses as needed for chest pain. 07/21/20  Yes [name], NP  OXYGEN Inhale 2 L into the lungs at bedtime.  Can use in the morning as needed   Yes [provider]  traMADol (ULTRAM) 50 MG tablet Take 50 mg by mouth every 12 (twelve) hours as needed for moderate pain.   Yes [provider]  celecoxib (CELEBREX) 200 MG capsule Take 1 capsule (200 mg total) by mouth daily. With food Patient not taking: Reported on 01/24/2022 12/28/21   Claretta Fraise, MD  nystatin (MYCOSTATIN/NYSTOP) powder Apply 1 Application topically 3 (three) times daily. Patient not taking: Reported on 01/24/2022 11/07/21   Evelina Dun A, FNP  nystatin cream (MYCOSTATIN) Apply 1 Application topically 2 (two) times daily. Patient not taking:  Reported on 01/24/2022 11/07/21   Sharion Balloon, FNP      Allergies    Plavix [clopidogrel bisulfate], Calcium channel blockers, Alprazolam, Codeine, Percodan [oxycodone-aspirin], and Valium    Review of Systems   Review of Systems  Constitutional:  Positive for fatigue.  HENT:  Positive for voice change.        Throat tightness  Neurological:        Near syncope  All other systems reviewed and are negative.   Physical Exam Updated Vital Signs BP (!) 136/55   Pulse 83   Temp 98.1 F (36.7 C) (Oral)   Resp 20   Ht '5\' 2"'$  (1.575 m)   Wt 77.1 kg   SpO2 92%   BMI 31.09 kg/m  Physical Exam Vitals and nursing note reviewed.  Constitutional:      General: She is not in acute distress.    Appearance: She is well-developed. She is not ill-appearing, toxic-appearing or diaphoretic.  HENT:     Head: Normocephalic and atraumatic.  Eyes:     Extraocular Movements: Extraocular movements intact.     Conjunctiva/sclera: Conjunctivae normal.  Cardiovascular:     Rate and Rhythm: Normal rate and regular rhythm.     Heart sounds: No murmur heard. Pulmonary:     Effort: Pulmonary effort is normal. No tachypnea, accessory muscle usage or respiratory distress.     Breath sounds: Normal breath sounds. No stridor. No decreased breath sounds, wheezing, rhonchi or rales.  Chest:     Chest wall: No tenderness.  Abdominal:     Palpations: Abdomen is soft.     Tenderness: There is no abdominal tenderness.  Musculoskeletal:        General: No swelling.     Cervical back: Normal range of motion and neck supple.     Right lower leg: No edema.     Left lower leg: No edema.  Skin:    General: Skin is warm and dry.     Capillary Refill: Capillary refill takes less than 2 seconds.     Coloration: Skin is not cyanotic or pale.  Neurological:     General: No focal deficit present.     Mental Status: She is alert and oriented to person, place, and time.  Psychiatric:        Mood and Affect:  Mood normal.        Behavior: Behavior normal.     ED Results / Procedures / Treatments   Labs (all labs ordered are listed, but only abnormal results are displayed) Labs Reviewed  BASIC METABOLIC PANEL - Abnormal; Notable for the following components:      Result Value   Glucose, Bld 103 (*)    All other components within normal limits  CBC - Abnormal; Notable for the following components:   Hemoglobin 11.7 (*)    All  other components within normal limits  TSH  URINALYSIS, ROUTINE W REFLEX MICROSCOPIC  TROPONIN I (HIGH SENSITIVITY)  TROPONIN I (HIGH SENSITIVITY)    EKG EKG Interpretation  Date/Time:  Wednesday January 24 2022 12:50:15 EDT Ventricular Rate:  65 PR Interval:  164 QRS Duration: 158 QT Interval:  444 QTC Calculation: 461 R Axis:   -4 Text Interpretation: Normal sinus rhythm Left bundle branch block Confirmed by Godfrey Pick [phone]) on 01/24/2022 5:49:52 PM  Radiology CT Angio Chest PE W and/or Wo Contrast  Result Date: 01/24/2022 CLINICAL DATA:  High probability for pulmonary embolism. Chest pain. EXAM: CT ANGIOGRAPHY CHEST WITH CONTRAST TECHNIQUE: Multidetector CT imaging of the chest was performed using the standard protocol during bolus administration of intravenous contrast. Multiplanar CT image reconstructions and MIPs were obtained to evaluate the vascular anatomy. RADIATION DOSE REDUCTION: This exam was performed according to the departmental dose-optimization program which includes automated exposure control, adjustment of the mA and/or kV according to patient size and/or use of iterative reconstruction technique. CONTRAST:  124m OMNIPAQUE IOHEXOL 350 MG/ML SOLN COMPARISON:  CT of the chest 11/11/2017. Thyroid ultrasound 07/24/2017. FINDINGS: Cardiovascular: There is adequate opacification of the pulmonary arteries. There is no evidence for pulmonary embolism. There is aneurysmal dilatation of the ascending aorta measuring 4.3 cm. The heart is mildly  enlarged. There is a trace pericardial effusion. There are atherosclerotic calcifications of the aorta and coronary arteries. Mediastinum/Nodes: There is a stable 12 mm hypodense right thyroid nodule. There are no enlarged mediastinal or hilar lymph nodes. The esophagus is within normal limits. Lungs/Pleura: Lungs are clear. No pleural effusion or pneumothorax. Upper Abdomen: Hypodense lesion in the right lobe of the liver measuring up to 2.3 cm appears unchanged. This was previously characterized as hemangioma. Gallbladder surgically absent. Musculoskeletal: Cervical spinal fusion plate is partially visualized. The bones are osteopenic. No acute fractures are seen. Degenerative changes affect the spine. Left shoulder arthroplasty is present. Review of the MIP images confirms the above findings. IMPRESSION: 1. No evidence for pulmonary embolism. 2. Stable aneurysmal dilatation of the ascending aorta measuring 4.3 cm. Recommend annual imaging followup by CTA or MRA. This recommendation follows 2010 ACCF/AHA/AATS/ACR/ASA/SCA/SCAI/SIR/STS/SVM Guidelines for the Diagnosis and Management of Patients with Thoracic Aortic Disease. Circulation. 2010; 121:: Y694-W546 Aortic aneurysm NOS (ICD10-I71.9) 3. Stable hypodense right thyroid nodule.  No follow-up necessary. Electronically Signed   By: ARonney AstersM.D.   On: 01/24/2022 19:02   CT Soft Tissue Neck W Contrast  Result Date: 01/24/2022 CLINICAL DATA:  Hoarseness EXAM: CT NECK WITH CONTRAST TECHNIQUE: Multidetector CT imaging of the neck was performed using the standard protocol following the bolus administration of intravenous contrast. RADIATION DOSE REDUCTION: This exam was performed according to the departmental dose-optimization program which includes automated exposure control, adjustment of the mA and/or kV according to patient size and/or use of iterative reconstruction technique. CONTRAST:  1026mOMNIPAQUE IOHEXOL 350 MG/ML SOLN COMPARISON:  None Available.  FINDINGS: Pharynx and larynx: Pharynx is normal. There is mild circumferential soft tissue thickening of the supraglottic larynx. The epiglottis is normal. There is mass effect on the upper esophagus related to anterior osteophyte at C4-5 and C5-7 ACDF. Salivary glands: No inflammation, mass, or stone. Thyroid: Normal. Lymph nodes: None enlarged or abnormal density. Vascular: Negative. Limited intracranial: Negative. Visualized orbits: Negative. Mastoids and visualized paranasal sinuses: Clear. Skeleton: C5-7 ACDF.  C4-5 large anterior osteophyte. Upper chest: Clear Other: None. IMPRESSION: 1. Mild circumferential soft tissue thickening of the supraglottic larynx. By provided history,  the patient has had a normal laryngoscopic exam. If this information is incorrect, laryngoscopy is recommended. 2. Mass effect on the upper esophagus related to anterior osteophyte at C4-5 and C5-7 ACDF. Electronically Signed   By: Ulyses Jarred M.D.   On: 01/24/2022 18:59   DG Chest 2 View  Result Date: 01/24/2022 CLINICAL DATA:  Chest pain. EXAM: CHEST - 2 VIEW COMPARISON:  July 13, 2021. FINDINGS: Stable cardiomegaly. Hyperexpansion of the lungs is noted. Status post left shoulder arthroplasty. Lungs are clear. IMPRESSION: No acute cardiopulmonary disease.  Hyperexpansion of the lungs. Electronically Signed   By: Marijo Conception M.D.   On: 01/24/2022 13:35    Procedures Procedures    Medications Ordered in ED Medications  iohexol (OMNIPAQUE) 350 MG/ML injection 100 mL (100 mLs Intravenous Contrast Given 01/24/22 1837)  lactated ringers bolus 500 mL (0 mLs Intravenous Stopped 01/24/22 2218)  dexamethasone (DECADRON) injection 10 mg (10 mg Intravenous Given 01/24/22 2218)    ED Course/ Medical Decision Making/ A&P                           Medical Decision Making Amount and/or Complexity of Data Reviewed Labs: ordered. Radiology: ordered.  Risk Prescription drug management. Decision regarding  hospitalization.   This patient presents to the ED for concern of near syncope, this involves an extensive number of treatment options, and is a complaint that carries with it a high risk of complications and morbidity.  The differential diagnosis includes polypharmacy, dehydration, CHF, PE, arrhythmia, TIA, CVA, anemia, metabolic derangements   Co morbidities that complicate the patient evaluation  GERD, CHF, HTN, anxiety, LBBB, CAD, COPD, HLD, rheumatoid arthritis   Additional history obtained:  Additional history obtained from N/A External records from outside source obtained and reviewed including EMR   Lab Tests:  I Ordered, and personally interpreted labs.  The pertinent results include: Baseline anemia, no leukocytosis, normal kidney function, normal electrolytes, normal TSH, normal troponin   Imaging Studies ordered:  I ordered imaging studies including chest x-ray, CT neck, CTA chest I independently visualized and interpreted imaging which showed no acute findings in chest.  On CT imaging of neck, patient does have mild second facial soft tissue thickening of supraglottic larynx I agree with the radiologist interpretation   Cardiac Monitoring: / EKG:  The patient was maintained on a cardiac monitor.  I personally viewed and interpreted the cardiac monitored which showed an underlying rhythm of: Sinus rhythm   Consultations Obtained:  I requested consultation with the otolaryngologist, Dr. Janace Hoard,  and discussed lab and imaging findings as well as pertinent plan - they recommend: Steroids, overnight observation, and outpatient laryngoscopy   Problem List / ED Course / Critical interventions / Medication management  Patient presents for near syncopal symptoms.  Prior to being bedded in the ED, diagnostic work-up was initiated.  Initial results show normal kidney function, normal electrolytes, baseline anemia, no leukocytosis, and normal troponin.  Chest x-ray shows no  acute findings.  She does have hyperexpansion of lungs consistent with her history of COPD.  On assessment, patient is resting in ED stretcher.  Her breathing is unlabored.  She is currently on 2 L of supplemental oxygen with SPO2 in the high 90s.  Supplemental oxygen was turned off for trial of room air sats.  Her lungs are clear to auscultation.  She is found to have a hoarse voice.  Oropharynx is widely patent without any evidence of edema.  She endorses hoarseness of voice for the past week.  No stridor is present.  Although patient endorses a episode of bradycardia today, heart rate has been normal since arrival in the ED.  EKG shows baseline LBBB with no significant changes from prior tracing.  Her home medications include Coreg with no other AV nodal agents.  Patient was kept on bedside cardiac monitor.  Patient underwent CTA chest and CT imaging of neck.  There were no acute cardiopulmonary findings.  On CT imaging of neck, patient does have mild circumferential thickening of supraglottic larynx.  I discussed this with otolaryngologist on-call, Dr. Janace Hoard.  Dr. Janace Hoard is reassured that patient's voice change has been ongoing for the past week and not significantly worsened.  He does feel that a viral laryngitis can cause noted imaging findings.  He does recommend initiation of steroids and overnight observation.  He feels that she would be appropriate for outpatient laryngoscopy.  Decadron was ordered.  Patient maintained a normal heart rate with normal sinus rhythm throughout her ED observation time.  She remained normotensive.  When standing up, patient would not have any acute drop in her blood pressure.  Despite this, she would have recurrence of near syncopal symptoms with standing.  Per chart review, patient last underwent echocardiogram in January.  At that time, she had a diminished LVEF, global hypokinesis, grade 1 diastolic dysfunction, and moderate pericardial effusion.  Given the patient's ongoing  symptoms, including throat tightness, I feel she would benefit from admission for observation in addition to further cardiac work-up.  Patient was admitted to hospitalist for further management. I ordered medication including Decadron for laryngeal swelling, IVF for hydration Reevaluation of the patient after these medicines showed that the patient stayed the same I have reviewed the patients home medicines and have made adjustments as needed   Social Determinants of Health:  Lives alone independently        Final Clinical Impression(s) / ED Diagnoses Final diagnoses:  Hoarseness of voice  Near syncope    Rx / DC Orders ED Discharge Orders     None         Godfrey Pick, MD 01/25/22 [phone]

## 2022-01-24 NOTE — Patient Instructions (Signed)
Visit Information  Thank you for taking time to visit with me today. Please don't hesitate to contact me if I can be of assistance to you.   Following are the goals we discussed today:   Goals Addressed               This Visit's Progress     Assist with Obtaining Personal Care Services through [employer]. (pt-stated)   On track     Care Coordination Interventions:  PHQ2/PHQ9 Depression Screen Completed & Results Reviewed.   Suicidal Ideation/Homicidal Ideation Assessed - None Present. Solution-Focused Strategies Employed. Active Listening/Reflection Utilized.  Verbalization of Feelings Encouraged.  Problem Solving/Task Centered Solutions Developed.   Quality of Sleep Assessed & Sleep Hygiene Techniques Promoted. Review Personal Care Services Application, Instructions, and Agency Provider List with Daughter, and [place] with Application Completion and Submission to Primary Care Provider, Dr. Claretta Fraise, for Review and Signature. CSW Collaboration with Primary Care Provider, Dr. Claretta Fraise, to Request Review and Signature of Personal Care Services Application. CSW Collaboration with Primary Care Provider, Dr. Claretta Fraise Nurse, to Request Completed and [place] Application Be Faxed to [employer] for [employer].         Our next appointment is by telephone on 02/07/2022 at 10:00 am.  Please call the care guide team at [phone] if you need to cancel or reschedule your appointment.   If you are experiencing a Mental Health or [place] or need someone to talk to, please call the Suicide and Crisis Lifeline: 988 call the [place] National Suicide Prevention Lifeline: 321-824-2784 or TTY: 279-215-5712 TTY 780-168-5880) to talk to a trained counselor call 1-800-273-TALK (toll free, 24 hour hotline) go to [facility] [address] [place]  [phone]) call the [place]: [phone] call 911  Patient verbalizes understanding of instructions and care plan provided today and agrees to view in [place] [facility]. Active MyChart status and patient understanding of how to access instructions and care plan via MyChart confirmed with patient.     Telephone follow up appointment with care management team member scheduled for:  02/07/2022 at 10:00 am.  Nat Christen, BSW, MSW, [place]  Licensed Clinical Social Worker  [place]  Mailing [place]. [address], [place], [place] [address] Physical Address-[address] [place], [place] [address] Toll Free Main # [phone] Fax # [phone] Cell # [phone] [email or web address]

## 2022-01-24 NOTE — Patient Outreach (Signed)
  Care Coordination   Initial Visit Note   01/24/2022  Name: [name] MRN: [id number] DOB: 08/26/1935  [name] is a 86 y.o. year old female who sees Stacks, Cletus Gash, MD for primary care. I spoke with [name] Schul by phone today.  What matters to the patients health and wellness today?  Assist with Obtaining [place] through [employer].    Goals Addressed               This Visit's Progress     Assist with Obtaining Personal Care Services through [employer]. (pt-stated)   On track     Care Coordination Interventions:  PHQ2/PHQ9 Depression Screen Completed & Results Reviewed.   Suicidal Ideation/Homicidal Ideation Assessed - None Present. Solution-Focused Strategies Employed. Active Listening/Reflection Utilized.  Verbalization of Feelings Encouraged.  Problem Solving/Task Centered Solutions Developed.   Quality of Sleep Assessed & Sleep Hygiene Techniques Promoted. Review Personal Care Services Application, Instructions, and Agency Provider List with Daughter, and [place] with Application Completion and Submission to Primary Care Provider, Dr. Claretta Fraise, for Review and Signature. CSW Collaboration with Primary Care Provider, Dr. Claretta Fraise, to Request Review and Signature of Personal Care Services Application. CSW Collaboration with Primary Care Provider, Dr. Claretta Fraise Nurse, to Request Completed and [place] Application Be Faxed to [employer] for [employer].         SDOH assessments and interventions completed:  Yes.  Care Coordination Interventions Activated:  Yes.   Care Coordination Interventions:  Yes, provided.   Follow up plan: Follow up call scheduled for 02/07/2022 at 10:00 am.  Encounter Outcome:  Pt. Visit Completed.   Nat Christen, BSW, MSW, LCSW  Licensed Education officer, environmental  [facility]  Mailing [place] [address], [place], [place] [address] Physical Address-[address] [place], [place] [address] Toll Free Main # [phone] Fax # [phone] Cell # [phone] [email or web address]

## 2022-01-24 NOTE — ED Notes (Signed)
Pt ambulated self to bathroom oxygen on room air remained 92 % or above

## 2022-01-25 ENCOUNTER — Encounter (HOSPITAL_COMMUNITY): Payer: Self-pay | Admitting: Family Medicine

## 2022-01-25 ENCOUNTER — Observation Stay (HOSPITAL_BASED_OUTPATIENT_CLINIC_OR_DEPARTMENT_OTHER): Payer: Medicare Other

## 2022-01-25 DIAGNOSIS — J387 Other diseases of larynx: Secondary | ICD-10-CM

## 2022-01-25 DIAGNOSIS — I25118 Atherosclerotic heart disease of native coronary artery with other forms of angina pectoris: Secondary | ICD-10-CM

## 2022-01-25 DIAGNOSIS — I10 Essential (primary) hypertension: Secondary | ICD-10-CM | POA: Diagnosis not present

## 2022-01-25 DIAGNOSIS — J432 Centrilobular emphysema: Secondary | ICD-10-CM | POA: Diagnosis not present

## 2022-01-25 DIAGNOSIS — I2510 Atherosclerotic heart disease of native coronary artery without angina pectoris: Secondary | ICD-10-CM

## 2022-01-25 DIAGNOSIS — R55 Syncope and collapse: Secondary | ICD-10-CM | POA: Diagnosis not present

## 2022-01-25 DIAGNOSIS — I5042 Chronic combined systolic (congestive) and diastolic (congestive) heart failure: Secondary | ICD-10-CM | POA: Diagnosis not present

## 2022-01-25 DIAGNOSIS — J384 Edema of larynx: Secondary | ICD-10-CM | POA: Diagnosis not present

## 2022-01-25 LAB — ECHOCARDIOGRAM COMPLETE
AR max vel: 2.03 cm2
AV Area VTI: 2.02 cm2
AV Area mean vel: 1.92 cm2
AV Mean grad: 5 mmHg
AV Peak grad: 9.1 mmHg
Ao pk vel: 1.51 m/s
Area-P 1/2: 4.63 cm2
Calc EF: 45.8 %
Height: 62 in
MV VTI: 1.98 cm2
S' Lateral: 4.1 cm
Single Plane A2C EF: 48 %
Single Plane A4C EF: 43.3 %
Weight: 2747.81 oz

## 2022-01-25 LAB — MAGNESIUM: Magnesium: 2.1 mg/dL (ref 1.7–2.4)

## 2022-01-25 LAB — CBC WITH DIFFERENTIAL/PLATELET
Abs Immature Granulocytes: 0.01 10*3/uL (ref 0.00–0.07)
Basophils Absolute: 0 10*3/uL (ref 0.0–0.1)
Basophils Relative: 1 %
Eosinophils Absolute: 0 10*3/uL (ref 0.0–0.5)
Eosinophils Relative: 0 %
HCT: 35.7 % — ABNORMAL LOW (ref 36.0–46.0)
Hemoglobin: 11.6 g/dL — ABNORMAL LOW (ref 12.0–15.0)
Immature Granulocytes: 0 %
Lymphocytes Relative: 18 %
Lymphs Abs: 0.6 10*3/uL — ABNORMAL LOW (ref 0.7–4.0)
MCH: 27.2 pg (ref 26.0–34.0)
MCHC: 32.5 g/dL (ref 30.0–36.0)
MCV: 83.6 fL (ref 80.0–100.0)
Monocytes Absolute: 0.1 10*3/uL (ref 0.1–1.0)
Monocytes Relative: 2 %
Neutro Abs: 2.7 10*3/uL (ref 1.7–7.7)
Neutrophils Relative %: 79 %
Platelets: 239 10*3/uL (ref 150–400)
RBC: 4.27 MIL/uL (ref 3.87–5.11)
RDW: 15.4 % (ref 11.5–15.5)
WBC: 3.4 10*3/uL — ABNORMAL LOW (ref 4.0–10.5)
nRBC: 0 % (ref 0.0–0.2)

## 2022-01-25 LAB — COMPREHENSIVE METABOLIC PANEL
ALT: 12 U/L (ref 0–44)
AST: 17 U/L (ref 15–41)
Albumin: 3.5 g/dL (ref 3.5–5.0)
Alkaline Phosphatase: 50 U/L (ref 38–126)
Anion gap: 6 (ref 5–15)
BUN: 17 mg/dL (ref 8–23)
CO2: 24 mmol/L (ref 22–32)
Calcium: 9.3 mg/dL (ref 8.9–10.3)
Chloride: 107 mmol/L (ref 98–111)
Creatinine, Ser: 0.61 mg/dL (ref 0.44–1.00)
GFR, Estimated: >60 mL/min (ref >60)
Glucose, Bld: 207 mg/dL — ABNORMAL HIGH (ref 70–99)
Potassium: 3.8 mmol/L (ref 3.5–5.1)
Sodium: 137 mmol/L (ref 135–145)
Total Bilirubin: 0.8 mg/dL (ref 0.3–1.2)
Total Protein: 6.3 g/dL — ABNORMAL LOW (ref 6.5–8.1)

## 2022-01-25 LAB — LIPID PANEL
Cholesterol: 205 mg/dL — ABNORMAL HIGH (ref 0–200)
HDL: 76 mg/dL (ref >40)
LDL Cholesterol: 122 mg/dL — ABNORMAL HIGH (ref 0–99)
Total CHOL/HDL Ratio: 2.7 RATIO
Triglycerides: 35 mg/dL (ref <150)
VLDL: 7 mg/dL (ref 0–40)

## 2022-01-25 LAB — GLUCOSE, CAPILLARY
Glucose-Capillary: 211 mg/dL — ABNORMAL HIGH (ref 70–99)
Glucose-Capillary: 167 mg/dL — ABNORMAL HIGH (ref 70–99)
Glucose-Capillary: 135 mg/dL — ABNORMAL HIGH (ref 70–99)

## 2022-01-25 MED ORDER — INSULIN ASPART 100 UNIT/ML IJ SOLN
0.0000 [IU] | Freq: Three times a day (TID) | INTRAMUSCULAR | Status: DC
  Administered 2022-01-25: 2 [IU] via SUBCUTANEOUS
  Administered 2022-01-25: 3 [IU] via SUBCUTANEOUS
  Administered 2022-01-26: 1 [IU] via SUBCUTANEOUS

## 2022-01-25 MED ORDER — PANTOPRAZOLE SODIUM 40 MG PO TBEC
40.0000 mg | DELAYED_RELEASE_TABLET | Freq: Every evening | ORAL | Status: DC
  Administered 2022-01-25: 40 mg via ORAL
  Filled 2022-01-25: qty 1

## 2022-01-25 MED ORDER — OXYCODONE HCL 5 MG PO TABS: 5.0000 mg | ORAL_TABLET | ORAL | Status: DC | PRN

## 2022-01-25 MED ORDER — METHYLPREDNISOLONE SODIUM SUCC 40 MG IJ SOLR
40.0000 mg | INTRAMUSCULAR | Status: DC
  Administered 2022-01-26: 40 mg via INTRAVENOUS
  Filled 2022-01-25: qty 1

## 2022-01-25 MED ORDER — SACUBITRIL-VALSARTAN 24-26 MG PO TABS
1.0000 | ORAL_TABLET | Freq: Two times a day (BID) | ORAL | Status: DC
  Administered 2022-01-25 – 2022-01-26 (×4): 1 via ORAL
  Filled 2022-01-25 (×4): qty 1

## 2022-01-25 MED ORDER — MORPHINE SULFATE (PF) 2 MG/ML IV SOLN: 1.0000 mg | INTRAVENOUS | Status: DC | PRN

## 2022-01-25 MED ORDER — ATORVASTATIN CALCIUM 40 MG PO TABS
40.0000 mg | ORAL_TABLET | Freq: Every evening | ORAL | Status: DC
  Administered 2022-01-25: 40 mg via ORAL
  Filled 2022-01-25: qty 1

## 2022-01-25 MED ORDER — FLUTICASONE FUROATE-VILANTEROL 100-25 MCG/ACT IN AEPB
1.0000 | INHALATION_SPRAY | Freq: Every day | RESPIRATORY_TRACT | Status: DC
  Administered 2022-01-25 – 2022-01-26 (×2): 1 via RESPIRATORY_TRACT
  Filled 2022-01-25: qty 28

## 2022-01-25 MED ORDER — MORPHINE SULFATE (PF) 2 MG/ML IV SOLN: 2.0000 mg | INTRAVENOUS | Status: DC | PRN

## 2022-01-25 MED ORDER — HEPARIN SODIUM (PORCINE) 5000 UNIT/ML IJ SOLN
5000.0000 [IU] | Freq: Three times a day (TID) | INTRAMUSCULAR | Status: DC
  Administered 2022-01-25 – 2022-01-26 (×4): 5000 [IU] via SUBCUTANEOUS
  Filled 2022-01-25 (×4): qty 1

## 2022-01-25 MED ORDER — ALBUTEROL SULFATE (2.5 MG/3ML) 0.083% IN NEBU: 2.5000 mg | INHALATION_SOLUTION | RESPIRATORY_TRACT | Status: DC | PRN

## 2022-01-25 MED ORDER — CLORAZEPATE DIPOTASSIUM 7.5 MG PO TABS
7.5000 mg | ORAL_TABLET | Freq: Two times a day (BID) | ORAL | Status: DC | PRN
  Administered 2022-01-26: 7.5 mg via ORAL
  Filled 2022-01-25: qty 1

## 2022-01-25 MED ORDER — ACETAMINOPHEN 325 MG PO TABS: 650.0000 mg | ORAL_TABLET | Freq: Four times a day (QID) | ORAL | Status: DC | PRN

## 2022-01-25 MED ORDER — TRAMADOL HCL 50 MG PO TABS: 50.0000 mg | ORAL_TABLET | Freq: Two times a day (BID) | ORAL | Status: DC | PRN

## 2022-01-25 MED ORDER — ALBUTEROL SULFATE HFA 108 (90 BASE) MCG/ACT IN AERS: 2.0000 | INHALATION_SPRAY | RESPIRATORY_TRACT | Status: DC | PRN

## 2022-01-25 MED ORDER — CARVEDILOL 3.125 MG PO TABS
6.2500 mg | ORAL_TABLET | Freq: Two times a day (BID) | ORAL | Status: DC
  Administered 2022-01-25 – 2022-01-26 (×3): 6.25 mg via ORAL
  Filled 2022-01-25 (×3): qty 2

## 2022-01-25 MED ORDER — SODIUM CHLORIDE 0.9% FLUSH
3.0000 mL | Freq: Two times a day (BID) | INTRAVENOUS | Status: DC
  Administered 2022-01-25 – 2022-01-26 (×4): 3 mL via INTRAVENOUS

## 2022-01-25 MED ORDER — ACETAMINOPHEN 650 MG RE SUPP: 650.0000 mg | Freq: Four times a day (QID) | RECTAL | Status: DC | PRN

## 2022-01-25 MED ORDER — METHYLPREDNISOLONE SODIUM SUCC 125 MG IJ SOLR
125.0000 mg | INTRAMUSCULAR | Status: DC
  Administered 2022-01-25: 125 mg via INTRAVENOUS
  Filled 2022-01-25: qty 2

## 2022-01-25 MED ORDER — ISOSORBIDE MONONITRATE ER 30 MG PO TB24
15.0000 mg | ORAL_TABLET | Freq: Every day | ORAL | Status: DC
  Administered 2022-01-25 – 2022-01-26 (×2): 15 mg via ORAL
  Filled 2022-01-25 (×2): qty 1

## 2022-01-25 MED ORDER — ONDANSETRON HCL 4 MG/2ML IJ SOLN: 4.0000 mg | Freq: Four times a day (QID) | INTRAMUSCULAR | Status: DC | PRN

## 2022-01-25 MED ORDER — UMECLIDINIUM BROMIDE 62.5 MCG/ACT IN AEPB
1.0000 | INHALATION_SPRAY | Freq: Every day | RESPIRATORY_TRACT | Status: DC
  Administered 2022-01-25 – 2022-01-26 (×2): 1 via RESPIRATORY_TRACT
  Filled 2022-01-25: qty 7

## 2022-01-25 MED ORDER — ASPIRIN 81 MG PO TBEC
81.0000 mg | DELAYED_RELEASE_TABLET | ORAL | Status: DC
  Administered 2022-01-25 – 2022-01-26 (×2): 81 mg via ORAL
  Filled 2022-01-25 (×2): qty 1

## 2022-01-25 MED ORDER — INSULIN ASPART 100 UNIT/ML IJ SOLN
3.0000 [IU] | Freq: Three times a day (TID) | INTRAMUSCULAR | Status: DC
  Administered 2022-01-25 – 2022-01-26 (×3): 3 [IU] via SUBCUTANEOUS

## 2022-01-25 MED ORDER — ONDANSETRON HCL 4 MG PO TABS: 4.0000 mg | ORAL_TABLET | Freq: Four times a day (QID) | ORAL | Status: DC | PRN

## 2022-01-25 NOTE — Assessment & Plan Note (Signed)
-   Happens upon standing but orthostatic vitals are negative - Echo in the a.m. - Monitor on telemetry - PT eval and treat - Continue to monitor

## 2022-01-25 NOTE — Progress Notes (Signed)
*  PRELIMINARY RESULTS* Echocardiogram 2D Echocardiogram has been performed.  [name] 01/25/2022, 9:44 AM

## 2022-01-25 NOTE — H&P (Signed)
History and Physical    Patient: [name] VQQ:[id number] DOB: 1935-08-22 DOA: 01/24/2022 DOS: the patient was seen and examined on 01/25/2022 PCP: [name] Fraise, MD  Patient coming from: Home  Chief Complaint:  Chief Complaint  Patient presents with   Chest Pain   HPI: [name] is a 86 y.o. female with medical history significant of chronic respiratory failure on 2 L nasal cannula at home, coronary artery disease, CHF, COPD, and more presents to ED with a chief complaint of hypoxia and lightheadedness.  Patient reports that she felt lightheaded so she quickly checked her oxygen and her O2 sats were in the 80s.  She put on her as needed oxygen at that time.  She reports that it did not feel like when she has had a heart attack in the past.  In the past when she had a heart attack she had chest tightness with diaphoresis, that was 5 years ago.  She did have left arm tingling today which made her think of her heart, so when she called in and she was told to come to the ER upon describing that.  She had no diaphoresis.  Patient did report lightheaded, and thinks she may have passed out if she did not find a place to sit down.  She attributes this to hypoxia with her oxygen sats in the 80s.  Patient reports that when she called and she was told to take 4 baby aspirin, so she did that prior to arrival.  She has nitroglycerin at home but she did not take it.  She did feel short of breath but did not use her rescue inhaler.  Patient reports at this time all of her symptoms have resolved.  She is not sure what made them better.  Patient does report that she has had worsening of her voice for several days.  For this reason a CT scan was done of her neck that showed supraglottic larynx thickening.  Patient has no other complaints at this time.  Patient does not smoke, does not drink, does not use illicit drugs.  She is vaccinated for COVID.  Patient is full code. Review of Systems: As mentioned in  the history of present illness. All other systems reviewed and are negative. Past Medical History:  Diagnosis Date   Acute on chronic respiratory failure with hypoxia (HCC) 04/24/2018   Anxiety    Atypical chest pain    Bronchospasm    CAD (coronary artery disease)    a. s/p DES to mid-LAD and DES to mid-OM1 in 11/2016   Cervical disc disorder with myelopathy, unspecified cervical region    Cervicalgia 01/19/2009   Qualifier: Diagnosis of  By: [name]     Chest pain at rest 4/33/2951   Chronic systolic (congestive) heart failure (HCC)    COPD (chronic obstructive pulmonary disease) (HCC)    Coronary artery disease    De Quervain's disease (tenosynovitis) 04/02/2011   Disc disease with myelopathy, cervical    Essential hypertension    Hemorrhoids    Liver mass    Lung, cysts, congenital    Left lung cyst   Myocardial infarction ([place])    Nephrolithiasis    Embedded   Nonischemic cardiomyopathy ([place])    LVEF 35-40% 2015   On home O2    Osteoarthritis    Sprain of wrist 08/07/2012   Thoracic ascending aortic aneurysm (HCC)    4.3 cm April 2016   Past Surgical History:  Procedure Laterality  Date   Benign breast tumors     CHOLECYSTECTOMY     COLONOSCOPY     COLONOSCOPY N/A 09/22/2014   Procedure: COLONOSCOPY;  Surgeon: [name] Houston, MD;  Location: [facility];  Service: Endoscopy;  Laterality: N/A;  830 -- to be done in OR under fluoro   Complete hysterectomy     CORONARY STENT INTERVENTION N/A 11/23/2016   Procedure: Coronary Stent Intervention;  Surgeon: [name], [name] M, MD;  Location: [facility];  Service: Cardiovascular;  Laterality: N/A;   LEFT HEART CATH AND CORONARY ANGIOGRAPHY N/A 11/23/2016   Procedure: Left Heart Cath and Coronary Angiography;  Surgeon: [name], [name] M, MD;  Location: [facility];  Service: Cardiovascular;  Laterality: N/A;   TONSILLECTOMY AND ADENOIDECTOMY     Social History:  reports that she quit smoking about 13  years ago. Her smoking use included cigarettes. She started smoking about 44 years ago. She has a 0.75 pack-year smoking history. She has never used smokeless tobacco. She reports that she does not drink alcohol and does not use drugs.  Allergies  Allergen Reactions   Plavix [Clopidogrel Bisulfate] Itching    Severe itching   Calcium Channel Blockers Other (See Comments)    cramping   Alprazolam Nausea And Vomiting   Codeine Nausea And Vomiting   Percodan [Oxycodone-Aspirin] Nausea And Vomiting   Valium Nausea And Vomiting    Family History  Problem Relation Age of Onset   Heart disease Mother    Aneurysm Father    Lung cancer Brother    Heart disease Sister    Diabetes Brother    Heart disease Brother     Prior to Admission medications   Medication Sig Start Date End Date Taking? Authorizing Provider  albuterol (VENTOLIN HFA) 108 (90 Base) MCG/ACT inhaler INHALE 2 PUFFS INTO THE LUNGS EVERY 4 HOURS AS NEEDED. Patient taking differently: Inhale 2 puffs into the lungs every 4 (four) hours as needed for wheezing or shortness of breath. 11/15/21  Yes [name] Mires, MD  alendronate (FOSAMAX) 70 MG tablet Take 1 tablet (70 mg total) by mouth every 7 (seven) days. Take with a full glass of water on an empty stomach. Do not lay down for at least 2 hours 09/26/21  Yes [name], [name] Gash, MD  Ascorbic Acid (VITAMIN C) 100 MG tablet Take 100 mg by mouth daily.   Yes [provider]  aspirin EC 81 MG tablet Take 81 mg by mouth every morning.    Yes [provider]  carvedilol (COREG) 6.25 MG tablet TAKE 1 TABLET BY MOUTH TWICE DAILY WITH MEALS. 12/05/21  Yes [name] Hector, MD  Cholecalciferol (VITAMIN D) 50 MCG (2000 UT) tablet Take 1 tablet (2,000 Units total) by mouth daily. 09/27/21  Yes [name], [name] Gash, MD  clorazepate (TRANXENE) 7.5 MG tablet Take 1 tablet (7.5 mg total) by mouth daily as needed for anxiety. 09/26/21  Yes [name], [name] Gash, MD  Cranberry 500 MG TABS Take 1 tablet by  mouth daily.   Yes [provider]  ENTRESTO 24-26 MG Take 1 tablet by mouth 2 (two) times daily. 10/02/21  Yes [provider]  Fluticasone-Umeclidin-Vilant (TRELEGY ELLIPTA) 100-62.5-25 MCG/ACT AEPB INHALE (1) PUFF INTO THE LUNGS DAILY Patient taking differently: Inhale 1 puff into the lungs daily. 11/15/21  Yes [name] Mires, MD  isosorbide mononitrate (IMDUR) 30 MG 24 hr tablet Take 0.5 tablets (15 mg total) by mouth daily. 07/15/21  Yes Barton Dubois, MD  leflunomide Jolee Ewing)  20 MG tablet Take 1 tablet (20 mg total) by mouth daily. 12/28/21  Yes [name], [name] Gash, MD  meclizine (ANTIVERT) 25 MG tablet Take 1 tablet (25 mg total) by mouth as needed for dizziness. 04/19/21  Yes [name] Hector, MD  nitroGLYCERIN (NITROSTAT) 0.4 MG SL tablet Place 1 tablet (0.4 mg total) under the tongue every 5 (five) minutes x 3 doses as needed for chest pain. 07/21/20  Yes [name], NP  OXYGEN Inhale 2 L into the lungs at bedtime. Can use in the morning as needed   Yes [provider]  traMADol (ULTRAM) 50 MG tablet Take 50 mg by mouth every 12 (twelve) hours as needed for moderate pain.   Yes [provider]  celecoxib (CELEBREX) 200 MG capsule Take 1 capsule (200 mg total) by mouth daily. With food Patient not taking: Reported on 01/24/2022 12/28/21   [name] Fraise, MD  nystatin (MYCOSTATIN/NYSTOP) powder Apply 1 Application topically 3 (three) times daily. Patient not taking: Reported on 01/24/2022 11/07/21   Evelina Dun A, FNP  nystatin cream (MYCOSTATIN) Apply 1 Application topically 2 (two) times daily. Patient not taking: Reported on 01/24/2022 11/07/21   Sharion Balloon, FNP    Physical Exam: Vitals:   01/25/22 0042 01/25/22 0046 01/25/22 0500 01/25/22 0526  BP:  (!) 140/67  (!) 133/57  Pulse:  81  88  Resp:  19  19  Temp:  97.7 F (36.5 C)  98 F (36.7 C)  TempSrc:  Oral    SpO2:  94%  95%  Weight: 77.9 kg  77.9 kg   Height:       1.  General: Patient lying  supine in bed,  no acute distress   2. Psychiatric: Alert and oriented x 3, mood and behavior normal for situation, pleasant and cooperative with exam   3. Neurologic: Speech and language are normal, face is symmetric, moves all 4 extremities voluntarily, at baseline without acute deficits on limited exam   4. HEENMT:  Head is atraumatic, normocephalic, pupils reactive to light, neck is supple, trachea is midline, mucous membranes are moist   5. Respiratory : Lungs are clear to auscultation bilaterally without wheezing, rhonchi, rales, no cyanosis, no increase in work of breathing or accessory muscle use   6. Cardiovascular : Heart rate normal, rhythm is regular, no rubs or gallops, no peripheral edema, peripheral pulses palpated   7. Gastrointestinal:  Abdomen is soft, nondistended, nontender to palpation bowel sounds active, no masses or organomegaly palpated   8. Skin:  Skin is warm, dry and intact without rashes, acute lesions, or ulcers on limited exam   9.Musculoskeletal:  No acute deformities or trauma, no asymmetry in tone, no peripheral edema, peripheral pulses palpated, no tenderness to palpation in the extremities  Data Reviewed: In the ED Temp 97.8, heart rate 57-87, respiratory rate 16-31, blood pressure 98/47-138/83, satting at 93% on 2 L nasal cannula No leukocytosis with a white blood cell count of 5.4, hemoglobin 11.7, platelets 274 Chemistries unremarkable Troponin 11, 10 TSH 0.62 CTA shows no PE and stable aneurysm Chest x-ray shows no active cardiopulmonary disease EKG shows a heart rate of 65, normal sinus rhythm, QTc 461 with a left bundle branch block ENT consulted for supraglottal larynx thickening shown on CT-recommended steroids and follow-up outpatient Admission requested for near syncope work-up  Assessment and Plan: * Near syncope - Happens upon standing but orthostatic vitals are negative - Echo in the a.m. - Monitor on telemetry -  PT eval and  treat - Continue to monitor  Supraglottic edema - CT soft tissue neck shows mild circumferential soft tissue thickening of the supraglottic larynx -ENT was consulted and recommends giving steroids and follow-up outpatient -Decadron started in the ED, continue with Solu-Medrol  CAD (coronary artery disease) - Continue aspirin, Imdur, beta-blocker, monitor on telemetry  COPD (chronic obstructive pulmonary disease) (HCC) Continue albuterol, Breo, Incruse of - Continue to monitor  Essential hypertension - Continue Coreg  Chronic combined systolic and diastolic CHF (congestive heart failure) (HCC) - Continue Entresto, Imdur, beta-blocker, aspirin - Continue monitor on telemetry      Advance Care Planning:   Code Status: Full Code   Consults:   Family Communication: No family at bedside  Severity of Illness: The appropriate patient status for this patient is OBSERVATION. Observation status is judged to be reasonable and necessary in order to provide the required intensity of service to ensure the patient's safety. The patient's presenting symptoms, physical exam findings, and initial radiographic and laboratory data in the context of their medical condition is felt to place them at decreased risk for further clinical deterioration. Furthermore, it is anticipated that the patient will be medically stable for discharge from the hospital within 2 midnights of admission.   Author: Rolla Plate, DO 01/25/2022 6:18 AM  For on call review [email or web address].

## 2022-01-25 NOTE — Evaluation (Signed)
Physical Therapy Evaluation Patient Details Name: [name] MRN: [id number] DOB: August 22, 1935 Today's Date: 01/25/2022  History of Present Illness  [name] is a 86 y.o. female with medical history significant of chronic respiratory failure on 2 L nasal cannula at home, coronary artery disease, CHF, COPD, and more presents to ED with a chief complaint of hypoxia and lightheadedness.  Patient reports that she felt lightheaded so she quickly checked her oxygen and her O2 sats were in the 80s.  She put on her as needed oxygen at that time.  She reports that it did not feel like when she has had a heart attack in the past.  In the past when she had a heart attack she had chest tightness with diaphoresis, that was 5 years ago.  She did have left arm tingling today which made her think of her heart, so when she called in and she was told to come to the ER upon describing that.  She had no diaphoresis.  Patient did report lightheaded, and thinks she may have passed out if she did not find a place to sit down.  She attributes this to hypoxia with her oxygen sats in the 80s.  Patient reports that when she called and she was told to take 4 baby aspirin, so she did that prior to arrival.  She has nitroglycerin at home but she did not take it.  She did feel short of breath but did not use her rescue inhaler.  Patient reports at this time all of her symptoms have resolved.  She is not sure what made them better.  Patient does report that she has had worsening of her voice for several days.  For this reason a CT scan was done of her neck that showed supraglottic larynx thickening.  Patient has no other complaints at this time.   Clinical Impression  Patient functioning near baseline for functional mobility and gait demonstrating good return for bed mobility, transfers and walking in room/hallway with frequent leaning on walls for support which is baseline per patient.  Patient demonstrates fair/good return for  going up/down steps using 1 side rail and encouraged to use her RW at home when having to walk longer distances.  Plan:  Patient discharged from physical therapy to care of nursing for ambulation daily as tolerated for length of stay.         Recommendations for follow up therapy are one component of a multi-disciplinary discharge planning process, led by the attending physician.  Recommendations may be updated based on patient status, additional functional criteria and insurance authorization.  Follow Up Recommendations No PT follow up      Assistance Recommended at Discharge Set up Supervision/Assistance  Patient can return home with the following  Assistance with cooking/housework;Help with stairs or ramp for entrance;A little help with walking and/or transfers    Equipment Recommendations None recommended by PT  Recommendations for Other Services       Functional Status Assessment Patient has had a recent decline in their functional status and/or demonstrates limited ability to make significant improvements in function in a reasonable and predictable amount of time     Precautions / Restrictions Precautions Precautions: Fall Restrictions Weight Bearing Restrictions: No      Mobility  Bed Mobility Overal bed mobility: Modified Independent                  Transfers Overall transfer level: Needs assistance   Transfers: Sit to/from Stand, Bed  to chair/wheelchair/BSC Sit to Stand: Modified independent (Device/Increase time), Supervision   Step pivot transfers: Modified independent (Device/Increase time), Supervision       General transfer comment: Leaning on walls during ambulation in hall.    Ambulation/Gait Ambulation/Gait assistance: Supervision Gait Distance (Feet): 100 Feet Assistive device: None, 1 person hand held assist Gait Pattern/deviations: Decreased step length - right, Decreased step length - left, Decreased stride length Gait velocity:  decreased     General Gait Details: slightly labored cadence with frequent leaning on walls for support without loss of balance and limited mostly due to fatigue  Stairs Stairs: Yes Stairs assistance: Supervision Stair Management: One rail Right, One rail Left, Step to pattern Number of Stairs: 3 General stair comments: demonstrates fair/good return for going up/down 3 steps using 1 siderail without loss of balance  Wheelchair Mobility    Modified Rankin (Stroke Patients Only)       Balance Overall balance assessment: Needs assistance Sitting-balance support: Feet supported, No upper extremity supported Sitting balance-Leahy Scale: Good Sitting balance - Comments: seated EOB   Standing balance support: During functional activity, No upper extremity supported Standing balance-Leahy Scale: Fair Standing balance comment: fair/good static, fair/poor dynamic                             Pertinent Vitals/Pain Pain Assessment Pain Assessment: No/denies pain    Home Living Family/patient expects to be discharged to:: Private residence Living Arrangements: Alone Available Help at Discharge: Family;Friend(s);Available PRN/intermittently Type of Home: House Home Access: Stairs to enter Entrance Stairs-Rails: None Entrance Stairs-Number of Steps: 1 Alternate Level Stairs-Number of Steps: 12 steps to basement Home Layout: Two level;Laundry or work area in basement;Able to live on main level with bedroom/bathroom Home Equipment: Conservation officer, nature (2 wheels);Cane - single point;Grab bars - tub/shower;Rollator (4 wheels) Additional Comments: Pt is in the proccess of getting a care attendent to come in 2 days a week.    Prior Function Prior Level of Function : Independent/Modified Independent             Mobility Comments: Ambulates without AD; leaning on walls at times. Pt drives. ADLs Comments: PRN assist from family for groceries.     Hand Dominance   Dominant  Hand: Right    Extremity/Trunk Assessment   Upper Extremity Assessment Upper Extremity Assessment: Defer to OT evaluation RUE Deficits / Details: WFL A/ROM LUE Deficits / Details: Limited to less than 50% A/ROM of shoulder flexion at baseline due to previous injury.    Lower Extremity Assessment Lower Extremity Assessment: Overall WFL for tasks assessed    Cervical / Trunk Assessment Cervical / Trunk Assessment: Normal  Communication   Communication: HOH  Cognition Arousal/Alertness: Awake/alert Behavior During Therapy: WFL for tasks assessed/performed Overall Cognitive Status: Within Functional Limits for tasks assessed                                          General Comments      Exercises     Assessment/Plan    PT Assessment Patient does not need any further PT services  PT Problem List         PT Treatment Interventions      PT Goals (Current goals can be found in the Care Plan section)  Acute Rehab PT Goals Patient Stated Goal: return home with family  to assist PT Goal Formulation: With patient Time For Goal Achievement: 01/25/22 Potential to Achieve Goals: Good    Frequency       Co-evaluation PT/OT/SLP Co-Evaluation/Treatment: Yes Reason for Co-Treatment: To address functional/ADL transfers PT goals addressed during session: Mobility/safety with mobility;Balance;Proper use of DME OT goals addressed during session: ADL's and self-care       AM-PAC PT "6 Clicks" Mobility  Outcome Measure Help needed turning from your back to your side while in a flat bed without using bedrails?: None Help needed moving from lying on your back to sitting on the side of a flat bed without using bedrails?: None Help needed moving to and from a bed to a chair (including a wheelchair)?: None Help needed standing up from a chair using your arms (e.g., wheelchair or bedside chair)?: None Help needed to walk in hospital room?: A Little Help needed  climbing 3-5 steps with a railing? : A Little 6 Click Score: 22    End of Session   Activity Tolerance: Patient tolerated treatment well;Patient limited by fatigue Patient left: in bed;with call bell/phone within reach Nurse Communication: Mobility status PT Visit Diagnosis: Unsteadiness on feet (R26.81);Other abnormalities of gait and mobility (R26.89);Muscle weakness (generalized) (M62.81)    Time: 2952-8413 PT Time Calculation (min) (ACUTE ONLY): 20 min   Charges:   PT Evaluation $PT Eval Moderate Complexity: 1 Mod PT Treatments $Therapeutic Activity: 8-22 mins        12:21 PM, 01/25/22 Lonell Grandchild, MPT Physical Therapist with [facility] [phone] office [phone] mobile phone

## 2022-01-25 NOTE — Evaluation (Signed)
Occupational Therapy Evaluation Patient Details Name: [name] MRN: [id number] DOB: May 22, 1935 Today's Date: 01/25/2022   History of Present Illness [name] is a 86 y.o. female with medical history significant of chronic respiratory failure on 2 L nasal cannula at home, coronary artery disease, CHF, COPD, and more presents to ED with a chief complaint of hypoxia and lightheadedness.  Patient reports that she felt lightheaded so she quickly checked her oxygen and her O2 sats were in the 80s.  She put on her as needed oxygen at that time.  She reports that it did not feel like when she has had a heart attack in the past.  In the past when she had a heart attack she had chest tightness with diaphoresis, that was 5 years ago.  She did have left arm tingling today which made her think of her heart, so when she called in and she was told to come to the ER upon describing that.  She had no diaphoresis.  Patient did report lightheaded, and thinks she may have passed out if she did not find a place to sit down.  She attributes this to hypoxia with her oxygen sats in the 80s.  Patient reports that when she called and she was told to take 4 baby aspirin, so she did that prior to arrival.  She has nitroglycerin at home but she did not take it.  She did feel short of breath but did not use her rescue inhaler.  Patient reports at this time all of her symptoms have resolved.  She is not sure what made them better.  Patient does report that she has had worsening of her voice for several days.  For this reason a CT scan was done of her neck that showed supraglottic larynx thickening.  Patient has no other complaints at this time.   Clinical Impression   Pt agreeable to OT evaluation. Pt appears to be at or near baseline levels for functional mobility. Pt was  ambulating in the room upon start of session. For longer distance in hall pt was leaning on walls with sings of fatigue. Pt was able to doff and don a sock  without assist. In generally pt is unsteady on feet which appears to be a baseline issues. Pt was left in bed with MD present. Pt will benefit from continued OT in the hospital and recommended venue below to increase strength, balance, and endurance for safe ADL's.        Recommendations for follow up therapy are one component of a multi-disciplinary discharge planning process, led by the attending physician.  Recommendations may be updated based on patient status, additional functional criteria and insurance authorization.   Follow Up Recommendations  No OT follow up    Assistance Recommended at Discharge PRN  Patient can return home with the following      Functional Status Assessment  Patient has not had a recent decline in their functional status  Equipment Recommendations  None recommended by OT    Recommendations for Other Services       Precautions / Restrictions Precautions Precautions: Fall Restrictions Weight Bearing Restrictions: No      Mobility Bed Mobility Overal bed mobility: Modified Independent                  Transfers Overall transfer level: Needs assistance   Transfers: Sit to/from Stand, Bed to chair/wheelchair/BSC Sit to Stand: Modified independent (Device/Increase time), Supervision     Step pivot  transfers: Modified independent (Device/Increase time), Supervision     General transfer comment: Leaning on walls during ambulation in hall.      Balance Overall balance assessment: Needs assistance Sitting-balance support: No upper extremity supported, Feet supported Sitting balance-Leahy Scale: Good Sitting balance - Comments: seated EOB   Standing balance support: No upper extremity supported, During functional activity Standing balance-Leahy Scale: Fair Standing balance comment: often leaning on wall                           ADL either performed or assessed with clinical judgement   ADL Overall ADL's : Modified  independent                                             Vision Baseline Vision/History: 1 Wears glasses Ability to See in Adequate Light: 0 Adequate Patient Visual Report: No change from baseline Vision Assessment?: No apparent visual deficits     Perception     Praxis      Pertinent Vitals/Pain Pain Assessment Pain Assessment: No/denies pain     Hand Dominance Right   Extremity/Trunk Assessment Upper Extremity Assessment Upper Extremity Assessment: RUE deficits/detail;LUE deficits/detail RUE Deficits / Details: WFL A/ROM LUE Deficits / Details: Limited to less than 50% A/ROM of shoulder flexion at baseline due to previous injury.   Lower Extremity Assessment Lower Extremity Assessment: Defer to PT evaluation   Cervical / Trunk Assessment Cervical / Trunk Assessment: Normal   Communication Communication Communication: HOH   Cognition Arousal/Alertness: Awake/alert Behavior During Therapy: WFL for tasks assessed/performed Overall Cognitive Status: Within Functional Limits for tasks assessed                                                        Home Living Family/patient expects to be discharged to:: Private residence Living Arrangements: Alone Available Help at Discharge: Family;Friend(s);Available PRN/intermittently Type of Home: House Home Access: Stairs to enter [employer] of Steps: 1 Entrance Stairs-Rails: None Home Layout: Two level;Laundry or work area in basement Alternate Therapist, sports of Steps: 12 Alternate Level Stairs-Rails: Can reach both;Right;Left Bathroom Shower/Tub: Teacher, early years/pre: Handicapped height Bathroom Accessibility: Yes How Accessible: Accessible via walker Home Equipment: Conservation officer, nature (2 wheels);Cane - single point;Grab bars - tub/shower;Rollator (4 wheels)   Additional Comments: Pt is in the proccess of getting a care attendent to come in 2 days a  week.      Prior Functioning/Environment Prior Level of Function : Independent/Modified Independent             Mobility Comments: Ambulates without AD; leaning on walls at times. Pt drives. ADLs Comments: PRN assist from family for groceries.                                Co-evaluation PT/OT/SLP Co-Evaluation/Treatment: Yes Reason for Co-Treatment: To address functional/ADL transfers   OT goals addressed during session: ADL's and self-care      AM-PAC OT "6 Clicks" Daily Activity     Outcome Measure Help from another person eating meals?: None Help from another person taking care of personal  grooming?: None Help from another person toileting, which includes using toliet, bedpan, or urinal?: None Help from another person bathing (including washing, rinsing, drying)?: None Help from another person to put on and taking off regular upper body clothing?: None Help from another person to put on and taking off regular lower body clothing?: None 6 Click Score: 24   End of Session    Activity Tolerance: Patient tolerated treatment well Patient left: in bed;Other (comment) (Doctor present)  OT Visit Diagnosis: Unsteadiness on feet (R26.81);Other abnormalities of gait and mobility (R26.89)                Time: 1594-7076 OT Time Calculation (min): 16 min Charges:  OT General Charges $OT Visit: 1 Visit OT Evaluation $OT Eval Low Complexity: 1 Low  Alize Borrayo OT, MOT   Larey Seat 01/25/2022, 10:18 AM

## 2022-01-25 NOTE — Assessment & Plan Note (Signed)
Continue albuterol, Breo, Incruse of - Continue to monitor

## 2022-01-25 NOTE — Consult Note (Addendum)
Cardiology Consultation   [name] ID: MABELL ESGUERRA MRN: [id number]; DOB: 10-Jan-1936  Admit date: 01/24/2022 Date of Consult: 01/25/2022  PCP:  Claretta Fraise, [place] [facility] Cardiologist:  Jenkins Rouge, MD        [name] Profile:   [name] is a 86 y.o. female with past medical history of CAD (s/p DES to mid-LAD and DES to mid-OM1 in 11/2016, mild peri-infarct ischemia by NST in 07/2021 and medical management pursued), HFrEF (EF 40-45% in 2018, at 45% by repeat echo in 12/2018 and 35-40% in 05/2021), HTN, HLD, RA, dilation of ascending aorta (at 4.2 cm by imaging in 2019) and COPD who is being seen 01/25/2022 for the evaluation of presyncopal episodes at the request of Dr. Wynetta Emery.  History of Present Illness:   [name] was last examined by Dr. Johnsie Cancel in 08/2021 following a recent hospitalization for vertigo and chest pain during which stress testing was ordered and showed evidence of prior MI with mild peri-infarct apical ischemia and medical management had been pursued with Imdur being added to her medication regimen. At the time of follow-up, [name] denied any recurrent chest pain but activity was limited secondary to arthritis. [name] was continued on her current cardiac medications including ASA 81 mg daily, Atorvastatin 40 mg daily, Coreg 6.25 mg twice daily, Entresto 24-26 mg twice daily and Imdur 15 mg daily  [name] presented to [facility] ED on 01/24/2022 for evaluation of chest pain and a near syncopal episode while at home. EMS reported initial vitals were stable with BP at 120/64 and heart rate in the 70's. [name] did report that her heart rate had been in the 40's when checked at home.  Initial labs showed WBC 5.4, Hgb 11.7, platelets 274, Na+ 141, K+ 4.1 and creatinine 0.67.  TSH 0.62. Initial and repeat troponin values have been negative at 11 and 10. CXR shows no acute cardiopulmonary abnormalities. CT of the neck showed mild circumferential soft tissue  thickening of the supraglottic larynx. CTA showed no evidence of a PE. [name] was noted to have stable aneurysmal dilatation of the ascending aorta measuring 4.3 cm. EKG showed normal sinus rhythm, heart rate 65 with known LBBB.  In talking with the [name] today, [name] reports having episodic dizziness for many years. Symptoms can occur with turning her head or can occur with positional changes. [name] reports that yesterday [name] was doing laundry and developed more dizziness. [name] did have some chest discomfort at that time as well which lasted for approximately 10 to 15 minutes and spontaneously resolved. [name] has intermittent dyspnea but no acute changes in this. No specific orthopnea, PND or pitting edema.  Past Medical History:  Diagnosis Date   Acute on chronic respiratory failure with hypoxia (HCC) 04/24/2018   Anxiety    Atypical chest pain    Bronchospasm    CAD (coronary artery disease)    a. s/p DES to mid-LAD and DES to mid-OM1 in 11/2016   Cervical disc disorder with myelopathy, unspecified cervical region    Cervicalgia 01/19/2009   Qualifier: Diagnosis of  By: Aline Brochure MD, [place]     Chest pain at rest 6/76/7209   Chronic systolic (congestive) heart failure (HCC)    COPD (chronic obstructive pulmonary disease) (HCC)    Coronary artery disease    De Quervain's disease (tenosynovitis) 04/02/2011   Disc disease with myelopathy, cervical    Essential hypertension    Hemorrhoids    Liver mass  Lung, cysts, congenital    Left lung cyst   Myocardial infarction [facility])    Nephrolithiasis    Embedded   Nonischemic cardiomyopathy ([place])    LVEF 35-40% 2015   On home O2    Osteoarthritis    Sprain of wrist 08/07/2012   Thoracic ascending aortic aneurysm (HCC)    4.3 cm April 2016    Past Surgical History:  Procedure Laterality Date   Benign breast tumors     CHOLECYSTECTOMY     COLONOSCOPY     COLONOSCOPY N/A 09/22/2014   Procedure: COLONOSCOPY;  Surgeon: Rogene Houston, MD;   Location: [facility];  Service: Endoscopy;  Laterality: N/A;  830 -- to be done in OR under fluoro   Complete hysterectomy     CORONARY STENT INTERVENTION N/A 11/23/2016   Procedure: Coronary Stent Intervention;  Surgeon: Martinique, Peter M, MD;  Location: [facility];  Service: Cardiovascular;  Laterality: N/A;   LEFT HEART CATH AND CORONARY ANGIOGRAPHY N/A 11/23/2016   Procedure: Left Heart Cath and Coronary Angiography;  Surgeon: Martinique, Peter M, MD;  Location: [facility];  Service: Cardiovascular;  Laterality: N/A;   TONSILLECTOMY AND ADENOIDECTOMY       Home Medications:  Prior to Admission medications   Medication Sig Start Date End Date Taking? Authorizing Provider  albuterol (VENTOLIN HFA) 108 (90 Base) MCG/ACT inhaler INHALE 2 PUFFS INTO THE LUNGS EVERY 4 HOURS AS NEEDED. [name] taking differently: Inhale 2 puffs into the lungs every 4 (four) hours as needed for wheezing or shortness of breath. 11/15/21  Yes Chesley Mires, MD  alendronate (FOSAMAX) 70 MG tablet Take 1 tablet (70 mg total) by mouth every 7 (seven) days. Take with a full glass of water on an empty stomach. Do not lay down for at least 2 hours 09/26/21  Yes Stacks, Cletus Gash, MD  Ascorbic Acid (VITAMIN C) 100 MG tablet Take 100 mg by mouth daily.   Yes [provider]  aspirin EC 81 MG tablet Take 81 mg by mouth every morning.    Yes [provider]  carvedilol (COREG) 6.25 MG tablet TAKE 1 TABLET BY MOUTH TWICE DAILY WITH MEALS. 12/05/21  Yes Josue Hector, MD  Cholecalciferol (VITAMIN D) 50 MCG (2000 UT) tablet Take 1 tablet (2,000 Units total) by mouth daily. 09/27/21  Yes Stacks, Cletus Gash, MD  clorazepate (TRANXENE) 7.5 MG tablet Take 1 tablet (7.5 mg total) by mouth daily as needed for anxiety. 09/26/21  Yes Stacks, Cletus Gash, MD  Cranberry 500 MG TABS Take 1 tablet by mouth daily.   Yes [provider]  ENTRESTO 24-26 MG Take 1 tablet by mouth 2 (two) times daily. 10/02/21  Yes [provider]  Fluticasone-Umeclidin-Vilant (TRELEGY ELLIPTA) 100-62.5-25 MCG/ACT AEPB INHALE (1) PUFF INTO THE LUNGS DAILY [name] taking differently: Inhale 1 puff into the lungs daily. 11/15/21  Yes Chesley Mires, MD  isosorbide mononitrate (IMDUR) 30 MG 24 hr tablet Take 0.5 tablets (15 mg total) by mouth daily. 07/15/21  Yes Barton Dubois, MD  leflunomide (ARAVA) 20 MG tablet Take 1 tablet (20 mg total) by mouth daily. 12/28/21  Yes Stacks, Cletus Gash, MD  meclizine (ANTIVERT) 25 MG tablet Take 1 tablet (25 mg total) by mouth as needed for dizziness. 04/19/21  Yes Josue Hector, MD  nitroGLYCERIN (NITROSTAT) 0.4 MG SL tablet Place 1 tablet (0.4 mg total) under the tongue every 5 (five) minutes x 3 doses as needed for chest pain. 07/21/20  Yes  [name], NP  OXYGEN Inhale 2 L into the lungs at bedtime. Can use in the morning as needed   Yes [provider]  traMADol (ULTRAM) 50 MG tablet Take 50 mg by mouth every 12 (twelve) hours as needed for moderate pain.   Yes [provider]  celecoxib (CELEBREX) 200 MG capsule Take 1 capsule (200 mg total) by mouth daily. With food [name] not taking: Reported on 01/24/2022 12/28/21   Claretta Fraise, MD  nystatin (MYCOSTATIN/NYSTOP) powder Apply 1 Application topically 3 (three) times daily. [name] not taking: Reported on 01/24/2022 11/07/21   Evelina Dun A, FNP  nystatin cream (MYCOSTATIN) Apply 1 Application topically 2 (two) times daily. [name] not taking: Reported on 01/24/2022 11/07/21   Sharion Balloon, FNP    Inpatient Medications: Scheduled Meds:  aspirin EC  81 mg Oral [id number]   carvedilol  6.25 mg Oral BID WC   fluticasone furoate-vilanterol  1 puff Inhalation Daily   And   umeclidinium bromide  1 puff Inhalation Daily   heparin  5,000 Units Subcutaneous Q8H   isosorbide mononitrate  15 mg Oral Daily   [START ON 01/26/2022] methylPREDNISolone (SOLU-MEDROL) injection  40 mg Intravenous Q24H   sacubitril-valsartan  1 tablet  Oral BID   sodium chloride flush  3 mL Intravenous Q12H   Continuous Infusions:  PRN Meds: acetaminophen **OR** acetaminophen, albuterol, clorazepate, morphine injection, ondansetron **OR** ondansetron (ZOFRAN) IV, oxyCODONE  Allergies:    Allergies  Allergen Reactions   Plavix [Clopidogrel Bisulfate] Itching    Severe itching   Calcium Channel Blockers Other (See Comments)    cramping   Alprazolam Nausea And Vomiting   Codeine Nausea And Vomiting   Percodan [Oxycodone-Aspirin] Nausea And Vomiting   Valium Nausea And Vomiting    Social History:   Social History   Socioeconomic History   Marital status: Widowed    Spouse name: Not on file   Number of children: Not on file   Years of education: 9th   Highest education level: Not on file  Occupational History    Employer: RETIRED  Tobacco Use   Smoking status: Former    Packs/day: 0.25    Years: 3.00    Total pack years: 0.75    Types: Cigarettes    Start date: 05/14/1977    Quit date: 05/28/2008    Years since quitting: 13.6   Smokeless tobacco: Never   Tobacco comments:    [name] states [name] only smoked for 3 years total  Vaping Use   Vaping Use: Never used  Substance and Sexual Activity   Alcohol use: No    Alcohol/week: 0.0 standard drinks of alcohol   Drug use: No   Sexual activity: Not Currently    Birth control/protection: Surgical    Comment: hyst  Other Topics Concern   Not on file  Social History Narrative   Not on file   Social Determinants of Health   Financial Resource Strain: Low Risk  (01/08/2022)   Overall Financial Resource Strain (CARDIA)    Difficulty of Paying Living Expenses: Not very hard  Food Insecurity: No Food Insecurity (01/25/2022)   Hunger Vital Sign    Worried About Running Out of Food in the Last Year: Never true    Ran Out of Food in the Last Year: Never true  Transportation Needs: No Transportation Needs (01/25/2022)   PRAPARE - Hydrologist  (Medical): No    Lack of Transportation (Non-Medical): No  Physical Activity: Inactive (01/06/2021)   Exercise Vital Sign    Days of Exercise per Week: 0 days    Minutes of Exercise per Session: 0 min  Stress: Stress Concern Present (01/06/2021)   [place]    Feeling of Stress : Very much  Social Connections: Socially Isolated (01/06/2021)   Social Connection and Isolation Panel [NHANES]    Frequency of Communication with Friends and Family: Three times a week    Frequency of Social Gatherings with Friends and Family: Twice a week    Attends Religious Services: Never    Marine scientist or Organizations: No    Attends Archivist Meetings: Never    Marital Status: Widowed  Intimate Partner Violence: Not At Risk (01/25/2022)   Humiliation, Afraid, Rape, and Kick questionnaire    Fear of Current or Ex-Partner: No    Emotionally Abused: No    Physically Abused: No    Sexually Abused: No    Family History:    Family History  Problem Relation Age of Onset   Heart disease Mother    Aneurysm Father    Lung cancer Brother    Heart disease Sister    Diabetes Brother    Heart disease Brother      ROS:  Please see the history of present illness.   All other ROS reviewed and negative.     Physical Exam/Data:   Vitals:   01/25/22 0046 01/25/22 0500 01/25/22 0526 01/25/22 0820  BP: (!) 140/67  (!) 133/57   Pulse: 81  88   Resp: '19  19 18  '$ Temp: 97.7 F (36.5 C)  98 F (36.7 C)   TempSrc: Oral     SpO2: 94%  95% 94%  Weight:  77.9 kg    Height:        Intake/Output Summary (Last 24 hours) at 01/25/2022 1037 Last data filed at 01/25/2022 0129 Gross per 24 hour  Intake 3 ml  Output --  Net 3 ml      01/25/2022    5:00 AM 01/25/2022   12:42 AM 01/24/2022   12:45 PM  Last 3 Weights  Weight (lbs) 171 lb 11.8 oz 171 lb 12.8 oz 170 lb  Weight (kg) 77.9 kg 77.928 kg 77.111 kg     Body mass  index is 31.41 kg/m.  General: Pleasant elderly female appearing in no acute distress.  HEENT: normal Neck: no JVD Vascular: No carotid bruits; Distal pulses 2+ bilaterally Cardiac:  normal S1, S2; RRR; no murmur  Lungs:  clear to auscultation bilaterally, no wheezing, rhonchi or rales  Abd: soft, nontender, no hepatomegaly  Ext: no pitting edema Musculoskeletal:  No deformities, BUE and BLE strength normal and equal Skin: warm and dry  Neuro:  CNs 2-12 intact, no focal abnormalities noted Psych:  Normal affect   EKG:  The EKG was personally reviewed and demonstrates: NSR, HR 65 with known LBBB.  Telemetry:  Telemetry was personally reviewed and demonstrates:  NSR, HR in 80's to 90's with occasional PVC's and couplets.   Relevant CV Studies:  Echocardiogram: 05/26/2021 IMPRESSIONS     1. Left ventricular ejection fraction, by estimation, is 35 to 40%. The  left ventricle has moderately decreased function. The left ventricle  demonstrates global hypokinesis. There is moderate left ventricular  hypertrophy. Left ventricular diastolic  parameters are consistent with Grade I diastolic dysfunction (impaired  relaxation).   2. Right ventricular systolic function is  normal. The right ventricular  size is normal. Tricuspid regurgitation signal is inadequate for assessing  PA pressure.   3. Left atrial size was mildly dilated.   4. Moderate pericardial effusion. The pericardial effusion is anterior to  the right ventricle.   5. The mitral valve is grossly normal. Mild mitral valve regurgitation.   6. The aortic valve was not well visualized. Aortic valve regurgitation  is mild. Aortic valve sclerosis/calcification is present, without any  evidence of aortic stenosis. Aortic valve mean gradient measures 5.0 mmHg.   7. The inferior vena cava is dilated in size with >50% respiratory  variability, suggesting right atrial pressure of 8 mmHg.   NST: 07/14/2021   Findings are consistent  with prior inferior myocardial infarction. Evidence of prior apical myocardial infarction with mild peri-infarct ischemia. The study is intermediate risk. Risk based primarily on decreased LVEF, there is fairly mild current ischemia.   No ST deviation was noted.   LV perfusion is abnormal.   Left ventricular function is abnormal. Nuclear stress EF: 36 %. The left ventricular ejection fraction is moderately decreased (30-44%).    Laboratory Data:  High Sensitivity Troponin:   Recent Labs  Lab 01/24/22 1342 01/24/22 1707  TROPONINIHS 11 10     Chemistry Recent Labs  Lab 01/24/22 1342 01/25/22 0507  NA 141 137  K 4.1 3.8  CL 107 107  CO2 25 24  GLUCOSE 103* 207*  BUN 12 17  CREATININE 0.67 0.61  CALCIUM 9.5 9.3  MG  --  2.1  GFRNONAA >60 >60  ANIONGAP 9 6    Recent Labs  Lab 01/25/22 0507  PROT 6.3*  ALBUMIN 3.5  AST 17  ALT 12  ALKPHOS 50  BILITOT 0.8   Lipids No results for input(s): "CHOL", "TRIG", "HDL", "LABVLDL", "LDLCALC", "CHOLHDL" in the last 168 hours.  Hematology Recent Labs  Lab 01/24/22 1342 01/25/22 0507  WBC 5.4 3.4*  RBC 4.36 4.27  HGB 11.7* 11.6*  HCT 36.6 35.7*  MCV 83.9 83.6  MCH 26.8 27.2  MCHC 32.0 32.5  RDW 15.4 15.4  PLT 274 239   Thyroid  Recent Labs  Lab 01/24/22 1641  TSH 0.682    BNPNo results for input(s): "BNP", "PROBNP" in the last 168 hours.  DDimer No results for input(s): "DDIMER" in the last 168 hours.   Radiology/Studies:  CT Angio Chest PE W and/or Wo Contrast  Result Date: 01/24/2022 CLINICAL DATA:  High probability for pulmonary embolism. Chest pain. EXAM: CT ANGIOGRAPHY CHEST WITH CONTRAST TECHNIQUE: Multidetector CT imaging of the chest was performed using the standard protocol during bolus administration of intravenous contrast. Multiplanar CT image reconstructions and MIPs were obtained to evaluate the vascular anatomy. RADIATION DOSE REDUCTION: This exam was performed according to the departmental  dose-optimization program which includes automated exposure control, adjustment of the mA and/or kV according to [name] size and/or use of iterative reconstruction technique. CONTRAST:  16m OMNIPAQUE IOHEXOL 350 MG/ML SOLN COMPARISON:  CT of the chest 11/11/2017. Thyroid ultrasound 07/24/2017. FINDINGS: Cardiovascular: There is adequate opacification of the pulmonary arteries. There is no evidence for pulmonary embolism. There is aneurysmal dilatation of the ascending aorta measuring 4.3 cm. The heart is mildly enlarged. There is a trace pericardial effusion. There are atherosclerotic calcifications of the aorta and coronary arteries. Mediastinum/Nodes: There is a stable 12 mm hypodense right thyroid nodule. There are no enlarged mediastinal or hilar lymph nodes. The esophagus is within normal limits. Lungs/Pleura: Lungs are clear. No pleural  effusion or pneumothorax. Upper Abdomen: Hypodense lesion in the right lobe of the liver measuring up to 2.3 cm appears unchanged. This was previously characterized as hemangioma. Gallbladder surgically absent. Musculoskeletal: Cervical spinal fusion plate is partially visualized. The bones are osteopenic. No acute fractures are seen. Degenerative changes affect the spine. Left shoulder arthroplasty is present. Review of the MIP images confirms the above findings. IMPRESSION: 1. No evidence for pulmonary embolism. 2. Stable aneurysmal dilatation of the ascending aorta measuring 4.3 cm. Recommend annual imaging followup by CTA or MRA. This recommendation follows 2010 ACCF/AHA/AATS/ACR/ASA/SCA/SCAI/SIR/STS/SVM Guidelines for the Diagnosis and Management of Patients with Thoracic Aortic Disease. Circulation. 2010; 121: D408-X448. Aortic aneurysm NOS (ICD10-I71.9) 3. Stable hypodense right thyroid nodule.  No follow-up necessary. Electronically Signed   By: Ronney Asters M.D.   On: 01/24/2022 19:02   CT Soft Tissue Neck W Contrast  Result Date: 01/24/2022 CLINICAL DATA:   Hoarseness EXAM: CT NECK WITH CONTRAST TECHNIQUE: Multidetector CT imaging of the neck was performed using the standard protocol following the bolus administration of intravenous contrast. RADIATION DOSE REDUCTION: This exam was performed according to the departmental dose-optimization program which includes automated exposure control, adjustment of the mA and/or kV according to [name] size and/or use of iterative reconstruction technique. CONTRAST:  170m OMNIPAQUE IOHEXOL 350 MG/ML SOLN COMPARISON:  None Available. FINDINGS: Pharynx and larynx: Pharynx is normal. There is mild circumferential soft tissue thickening of the supraglottic larynx. The epiglottis is normal. There is mass effect on the upper esophagus related to anterior osteophyte at C4-5 and C5-7 ACDF. Salivary glands: No inflammation, mass, or stone. Thyroid: Normal. Lymph nodes: None enlarged or abnormal density. Vascular: Negative. Limited intracranial: Negative. Visualized orbits: Negative. Mastoids and visualized paranasal sinuses: Clear. Skeleton: C5-7 ACDF.  C4-5 large anterior osteophyte. Upper chest: Clear Other: None. IMPRESSION: 1. Mild circumferential soft tissue thickening of the supraglottic larynx. By provided history, the [name] has had a normal laryngoscopic exam. If this information is incorrect, laryngoscopy is recommended. 2. Mass effect on the upper esophagus related to anterior osteophyte at C4-5 and C5-7 ACDF. Electronically Signed   By: KUlyses JarredM.D.   On: 01/24/2022 18:59   DG Chest 2 View  Result Date: 01/24/2022 CLINICAL DATA:  Chest pain. EXAM: CHEST - 2 VIEW COMPARISON:  July 13, 2021. FINDINGS: Stable cardiomegaly. Hyperexpansion of the lungs is noted. Status post left shoulder arthroplasty. Lungs are clear. IMPRESSION: No acute cardiopulmonary disease.  Hyperexpansion of the lungs. Electronically Signed   By: JMarijo ConceptionM.D.   On: 01/24/2022 13:35     Assessment and Plan:   1. Chest Pain/CAD - [name]  has known CAD with DES to mid-LAD and DES to mid-OM1 in 11/2016 and most recent ischemic evaluation was a recent NST in 07/2021 which showed mild peri-infarct ischemia. - [name] had an isolated episode of chest pain yesterday which spontaneously resolved. Troponin values have been negative this admission. A repeat echocardiogram is pending for reassessment of her EF. If this is overall similar to prior imaging, would not anticipate further ischemic evaluation this admission. If [name] has recurrent chest pain, would initially recommend further titration of Imdur as [name] is currently on 15 mg daily. Continue ASA 81 mg daily and Coreg 6.25 mg twice daily. [name] was previously on Atorvastatin 40 mg daily and it is unclear why this is not currently on her medication list. Would recommend resuming at the time of discharge.    2. HFrEF - Her EF was at 35-40%  by echo in 05/2021. Repeat imaging is pending at this time. [name] appears euvolemic on examination. Continue current medical therapy with Coreg 6.25 mg twice daily, Imdur 15 mg daily and Entresto 24-26 mg twice daily. If her EF remains reduced, could consider the addition of low-dose Spironolactone if BP allows or adding an SGLT2 inhibitor.  3. Presyncope - [name] describes intermittent dizziness which has occurred for many years and while this can occur with positional changes, it also occurs with turning her head from side to side and [name] has known vertigo as well. Orthostatics were checked on admission and BP was at 127/72 with lying down, 107/47 with sitting and improved to 134/58 with standing.  - Echo is pending. Could consider an outpatient monitor given her ectopy on telemetry but overall her symptoms seem atypical for a cardiac arrhythmia.  4. Dilation of Ascending Aorta  - Measured at 4.2 cm by imaging in 2019 and overall stable at 4.3 cm by imaging this admission. Continue to follow as an outpatient.   5. Pericardial Effusion - This was moderate by echo in  05/2021. Repeat imaging pending.   6. Abnormal CT - CT of the Neck shows mild circumferential soft tissue thickening of the supraglottic larynx. ENT was consulted by the admitting team and [name] was started on steroids with plans for outpatient ENT follow-up.    For questions or updates, please contact [facility] Please consult [email or web address] for contact info under    Signed, Erma Heritage, PA-C  01/25/2022 10:37 AM   [name] seen and examined and agree with Bernerd Pho, PA-C as detailed above.  In brief, the [name] is a 86 y.o. female with past medical history of CAD (s/p DES to mid-LAD and DES to mid-OM1 in 11/2016, mild peri-infarct ischemia by NST in 07/2021 and medical management pursued), HFrEF (EF 40-45% in 2018, at 45% by repeat echo in 12/2018 and 35-40% in 05/2021), HTN, HLD, RA, dilation of ascending aorta (at 4.2 cm by imaging in 2019) and COPD who presented to the ER with episode of chest tightness, pre-syncope and SOB for which Cardiology was consulted.  [name] reports that yesterday after folding her laundry, [name] developed the sensation like "[name] was going to fall out." Had some associated chest tightness and diaphoresis at that time. [name] sat down and took her O2 which was in the 80s prompting her to call EMS. By the time [name] made it to [facility], he symptoms had resolved. Notably, [name] states [name] has been having "dizzy episodes" for years. These can occur with certain head positions or with positional changes. Denies any orthopnea, PND, LE edema. Has chronic dyspnea that is unchanged. Has not been having chest pain prior to yesterdays event.  In ER, labs notable for trop 11>10. CXR without acute process. CTA with no evidence of PE. ECG with NSR with chronic LBBB. Currently, the [name] states [name] feels well.  Overall, [name] had isolated episode of chest pain in the setting of known CAD that spontaneously resolved without intervention. Trops negative. Recent stress  test with very mild ischemia. Would favor conservative therapy unless echo shows further drop in EF or new wall motion abnormality.  GEN: Elderly female, NAD  Neck: No JVD Cardiac: RRR, no murmurs, rubs, or gallops.  Respiratory: Clear to auscultation bilaterally. GI: Soft, nontender, non-distended  MS: No edema; No deformity. Neuro:  Nonfocal  Psych: Normal affect    Plan: -Follow-up TTE -If TTE unchanged from prior, will plan for continued medical therapy  for known CAD and HFrEF -Continue ASA '81mg'$  daily, coreg 6.'25mg'$  BID -Continue imdur '15mg'$  daily; can uptitrate if needed -Continue entresto 24-'26mg'$  BID and can add spiro as able; orthostatics  negative but discussed importance of fluid and PO intake given intermittent dizziness with position changes -Has frequent PVCs on monitor and given episodes of dizziness that have been ongoing for several years; can consider outpatient monitor  -Started on steroids for soft tissue thickening of the supraglottic larynx noted on CT -Needs continued monitoring of ascending aorta as outpatient -Will follow-up TTE for monitoring of known moderate pericardial effusion  Gwyndolyn Kaufman, MD

## 2022-01-25 NOTE — Assessment & Plan Note (Deleted)
-   CT soft tissue neck shows mild circumferential soft tissue thickening of the supraglottic larynx

## 2022-01-25 NOTE — TOC Progression Note (Signed)
  Transition of Care (TOC) Screening Note   Patient Details  Name: [name] Date of Birth: 23-Jul-1935   Transition of Care [facility]) CM/SW Contact:    Shade Flood, LCSW Phone Number: 01/25/2022, 12:50 PM    Transition of Care Department [facility]) has reviewed patient and no TOC needs have been identified at this time. We will continue to monitor patient advancement through interdisciplinary progression rounds. If new patient transition needs arise, please place a TOC consult.

## 2022-01-25 NOTE — Assessment & Plan Note (Signed)
-   Continue aspirin, Imdur, beta-blocker, monitor on telemetry

## 2022-01-25 NOTE — Assessment & Plan Note (Signed)
-  Continue Coreg 

## 2022-01-25 NOTE — Progress Notes (Signed)
ASSUMPTION OF CARE NOTE   01/25/2022 11:06 AM  [name] was seen and examined.  The H&P by the admitting provider, orders, imaging was reviewed.  Please see new orders.  Will continue to follow.  Given patient reporting CP symptoms will ask for an inpatient cardiology service consultation.   Vitals:   01/25/22 0526 01/25/22 0820  BP: (!) 133/57   Pulse: 88   Resp: 19 18  Temp: 98 F (36.7 C)   SpO2: 95% 94%    Results for orders placed or performed during the hospital encounter of 99/35/70  Basic metabolic panel  Result Value Ref Range   Sodium 141 135 - 145 mmol/L   Potassium 4.1 3.5 - 5.1 mmol/L   Chloride 107 98 - 111 mmol/L   CO2 25 22 - 32 mmol/L   Glucose, Bld 103 (H) 70 - 99 mg/dL   BUN 12 8 - 23 mg/dL   Creatinine, Ser 0.67 0.44 - 1.00 mg/dL   Calcium 9.5 8.9 - 10.3 mg/dL   GFR, Estimated >60 >60 mL/min   Anion gap 9 5 - 15  CBC  Result Value Ref Range   WBC 5.4 4.0 - 10.5 K/uL   RBC 4.36 3.87 - 5.11 MIL/uL   Hemoglobin 11.7 (L) 12.0 - 15.0 g/dL   HCT 36.6 36.0 - 46.0 %   MCV 83.9 80.0 - 100.0 fL   MCH 26.8 26.0 - 34.0 pg   MCHC 32.0 30.0 - 36.0 g/dL   RDW 15.4 11.5 - 15.5 %   Platelets 274 150 - 400 K/uL   nRBC 0.0 0.0 - 0.2 %  TSH  Result Value Ref Range   TSH 0.682 0.350 - 4.500 uIU/mL  Comprehensive metabolic panel  Result Value Ref Range   Sodium 137 135 - 145 mmol/L   Potassium 3.8 3.5 - 5.1 mmol/L   Chloride 107 98 - 111 mmol/L   CO2 24 22 - 32 mmol/L   Glucose, Bld 207 (H) 70 - 99 mg/dL   BUN 17 8 - 23 mg/dL   Creatinine, Ser 0.61 0.44 - 1.00 mg/dL   Calcium 9.3 8.9 - 10.3 mg/dL   Total Protein 6.3 (L) 6.5 - 8.1 g/dL   Albumin 3.5 3.5 - 5.0 g/dL   AST 17 15 - 41 U/L   ALT 12 0 - 44 U/L   Alkaline Phosphatase 50 38 - 126 U/L   Total Bilirubin 0.8 0.3 - 1.2 mg/dL   GFR, Estimated >60 >60 mL/min   Anion gap 6 5 - 15  Magnesium  Result Value Ref Range   Magnesium 2.1 1.7 - 2.4 mg/dL  CBC with Differential/Platelet  Result Value Ref  Range   WBC 3.4 (L) 4.0 - 10.5 K/uL   RBC 4.27 3.87 - 5.11 MIL/uL   Hemoglobin 11.6 (L) 12.0 - 15.0 g/dL   HCT 35.7 (L) 36.0 - 46.0 %   MCV 83.6 80.0 - 100.0 fL   MCH 27.2 26.0 - 34.0 pg   MCHC 32.5 30.0 - 36.0 g/dL   RDW 15.4 11.5 - 15.5 %   Platelets 239 150 - 400 K/uL   nRBC 0.0 0.0 - 0.2 %   Neutrophils Relative % 79 %   Neutro Abs 2.7 1.7 - 7.7 K/uL   Lymphocytes Relative 18 %   Lymphs Abs 0.6 (L) 0.7 - 4.0 K/uL   Monocytes Relative 2 %   Monocytes Absolute 0.1 0.1 - 1.0 K/uL   Eosinophils Relative 0 %  Eosinophils Absolute 0.0 0.0 - 0.5 K/uL   Basophils Relative 1 %   Basophils Absolute 0.0 0.0 - 0.1 K/uL   WBC Morphology MORPHOLOGY UNREMARKABLE    RBC Morphology MORPHOLOGY UNREMARKABLE    Smear Review LARGE PLTS PRESENT    Immature Granulocytes 0 %   Abs Immature Granulocytes 0.01 0.00 - 0.07 K/uL  Glucose, capillary  Result Value Ref Range   Glucose-Capillary 211 (H) 70 - 99 mg/dL  ECHOCARDIOGRAM COMPLETE  Result Value Ref Range   Weight 2,747.81 oz   Height 62 in   BP 133/57 mmHg  Troponin I (High Sensitivity)  Result Value Ref Range   Troponin I (High Sensitivity) 11 <18 ng/L  Troponin I (High Sensitivity)  Result Value Ref Range   Troponin I (High Sensitivity) 10 <18 ng/L     C. Wynetta Emery, MD [facility]   01/24/2022  3:59 PM How to contact the [facility] Attending or Consulting provider [place] or covering provider during after hours [place], for this patient?  Check the care team in [facility] and look for a) attending/consulting [facility] provider listed and b) the [facility] team listed Log into [email or web address] and use [place]'s universal password to access. If you do not have the password, please contact the hospital operator. Locate the [facility] provider you are looking for under [facility] and page to a number that you can be directly reached. If you still have difficulty reaching the provider, please page the [facility] (Director on Call) for the Hospitalists listed on  amion for assistance.

## 2022-01-25 NOTE — Assessment & Plan Note (Signed)
-   Continue Entresto, Imdur, beta-blocker, aspirin - Continue monitor on telemetry

## 2022-01-25 NOTE — Assessment & Plan Note (Signed)
-   CT soft tissue neck shows mild circumferential soft tissue thickening of the supraglottic larynx -ENT was consulted and recommends giving steroids and follow-up outpatient -Decadron started in the ED, continue with Solu-Medrol

## 2022-01-26 DIAGNOSIS — J384 Edema of larynx: Secondary | ICD-10-CM | POA: Diagnosis not present

## 2022-01-26 DIAGNOSIS — R55 Syncope and collapse: Secondary | ICD-10-CM | POA: Diagnosis not present

## 2022-01-26 DIAGNOSIS — I10 Essential (primary) hypertension: Secondary | ICD-10-CM | POA: Diagnosis not present

## 2022-01-26 DIAGNOSIS — I25118 Atherosclerotic heart disease of native coronary artery with other forms of angina pectoris: Secondary | ICD-10-CM | POA: Diagnosis not present

## 2022-01-26 DIAGNOSIS — J387 Other diseases of larynx: Secondary | ICD-10-CM | POA: Diagnosis not present

## 2022-01-26 LAB — GLUCOSE, CAPILLARY: Glucose-Capillary: 134 mg/dL — ABNORMAL HIGH (ref 70–99)

## 2022-01-26 MED ORDER — PREDNISONE 20 MG PO TABS: 20.0000 mg | ORAL_TABLET | Freq: Every day | ORAL | Status: DC

## 2022-01-26 MED ORDER — INFLUENZA VAC A&B SA ADJ QUAD 0.5 ML IM PRSY: 0.5000 mL | PREFILLED_SYRINGE | INTRAMUSCULAR | Status: DC

## 2022-01-26 MED ORDER — ATORVASTATIN CALCIUM 40 MG PO TABS: 40.0000 mg | ORAL_TABLET | Freq: Every evening | ORAL | 1 refills | Status: DC

## 2022-01-26 MED ORDER — PREDNISONE 20 MG PO TABS: 20.0000 mg | ORAL_TABLET | Freq: Every day | ORAL | 0 refills | Status: DC

## 2022-01-26 NOTE — Discharge Summary (Signed)
Physician Discharge Summary  Dolce Sylvia Westbrooks BJS:[id number] DOB: 09-21-1935 DOA: 01/24/2022  PCP: Claretta Fraise, MD  Admit date: 01/24/2022 Discharge date: 01/26/2022  Admitted From:  HOME  Disposition: HOME   Recommendations for Outpatient Follow-up:  Follow up with PCP in 2 weeks Please follow up with Dr Vassie Loll - ENT as soon as possible  (referral sent) Please follow up with Dr. Jenetta Downer in 1-2 weeks Please follow up with cardiology as scheduled and have repeat Echocardiogram done (follow up pericardial effusion)    Discharge Condition: STABLE   CODE STATUS: FULL DIET: soft foods recommended    Brief Hospitalization Summary: Please see all hospital notes, images, labs for full details of the hospitalization. 86 y.o. female with medical history significant of chronic respiratory failure on 2 L nasal cannula at home, coronary artery disease, CHF, COPD, and more presents to ED with a chief complaint of hypoxia and lightheadedness.  Patient reports that she felt lightheaded so she quickly checked her oxygen and her O2 sats were in the 80s.  She put on her as needed oxygen at that time.  She reports that it did not feel like when she has had a heart attack in the past.  In the past when she had a heart attack she had chest tightness with diaphoresis, that was 5 years ago.  She did have left arm tingling today which made her think of her heart, so when she called in and she was told to come to the ER upon describing that.  She had no diaphoresis.  Patient did report lightheaded, and thinks she may have passed out if she did not find a place to sit down.  She attributes this to hypoxia with her oxygen sats in the 80s.  Patient reports that when she called and she was told to take 4 baby aspirin, so she did that prior to arrival.  She has nitroglycerin at home but she did not take it.  She did feel short of breath but did not use her rescue inhaler.  Patient reports at this time all of her symptoms have  resolved.  She is not sure what made them better.  Patient does report that she has had worsening of her voice for several days.  For this reason a CT scan was done of her neck that showed supraglottic larynx thickening.  Patient has no other complaints at this time.   Patient does not smoke, does not drink, does not use illicit drugs.  She is vaccinated for COVID.  Patient is full code.   Hospital Course   This patient was admitted for observation in the hospital for symptoms of near syncope and chest discomfort.  She was evaluated by the inpatient cardiology service and had a 2D echocardiogram which was reviewed that showed her EF is essentially unchanged but she does have a moderate pericardial effusion slightly larger from previous testing but stable and will need outpatient follow-up.  She was evaluated by the PT services and no additional PT service was recommended.  She was noted that she should continue all of her cardiac medications and cardiology has arranged outpatient follow-up for her.  No further inpatient cardiac testing but they will consider an outpatient cardiac monitoring given that she did demonstrate frequent PVCs on telemetry monitoring.  Her ascending aortic dilatation was noted to be stable and they recommended continued outpatient follow-up.  Her.  Her cardial effusion should be followed up outpatient and they have made arrangements for repeat echocardiogram outpatient  and follow-up with her cardiologist.  Patient was also noted on CT scan to have soft tissue thickening of the supraglottic larynx and she was started on steroids after we discussed with ENT Dr. Vassie Loll and he recommended that she have outpatient follow-up with ENT and I have made a referral request for patient to follow-up with Dr. Vassie Loll.  Patient also needs to follow-up with gastroenterology as she feels like she may need to have her esophagus stretched as she has had this done before with Dr. Laural Golden for an esophageal  stricture.  Patient for some reason had not been taking her atorvastatin but we have represcribed it and asked her to start taking it daily as recommended by cardiology service.   Discharge Diagnoses:  Principal Problem:   Near syncope Active Problems:   Chronic combined systolic and diastolic CHF (congestive heart failure) (HCC)   Essential hypertension   COPD (chronic obstructive pulmonary disease) (HCC)   CAD (coronary artery disease)   Supraglottic edema   Discharge Instructions: Discharge Instructions     Ambulatory referral to ENT   Complete by: As directed    Hospital Follow up   Ambulatory referral to Gastroenterology   Complete by: As directed    Hospital follow up for need of esophageal stretching   What is the reason for referral?: Other      Allergies as of 01/26/2022       Reactions   Plavix [clopidogrel Bisulfate] Itching   Severe itching   Calcium Channel Blockers Other (See Comments)   cramping   Alprazolam Nausea And Vomiting   Codeine Nausea And Vomiting   Percodan [oxycodone-aspirin] Nausea And Vomiting   Valium Nausea And Vomiting        Medication List     STOP taking these medications    celecoxib 200 MG capsule Commonly known as: CeleBREX       TAKE these medications    alendronate 70 MG tablet Commonly known as: FOSAMAX Take 1 tablet (70 mg total) by mouth every 7 (seven) days. Take with a full glass of water on an empty stomach. Do not lay down for at least 2 hours   aspirin EC 81 MG tablet Take 81 mg by mouth every morning.   atorvastatin 40 MG tablet Commonly known as: LIPITOR Take 1 tablet (40 mg total) by mouth every evening.   carvedilol 6.25 MG tablet Commonly known as: COREG TAKE 1 TABLET BY MOUTH TWICE DAILY WITH MEALS.   clorazepate 7.5 MG tablet Commonly known as: TRANXENE Take 1 tablet (7.5 mg total) by mouth daily as needed for anxiety.   Cranberry 500 MG Tabs Take 1 tablet by mouth daily.   Entresto  24-26 MG Generic drug: sacubitril-valsartan Take 1 tablet by mouth 2 (two) times daily.   isosorbide mononitrate 30 MG 24 hr tablet Commonly known as: IMDUR Take 0.5 tablets (15 mg total) by mouth daily.   leflunomide 20 MG tablet Commonly known as: ARAVA Take 1 tablet (20 mg total) by mouth daily.   meclizine 25 MG tablet Commonly known as: ANTIVERT Take 1 tablet (25 mg total) by mouth as needed for dizziness.   nitroGLYCERIN 0.4 MG SL tablet Commonly known as: NITROSTAT Place 1 tablet (0.4 mg total) under the tongue every 5 (five) minutes x 3 doses as needed for chest pain.   nystatin powder Commonly known as: MYCOSTATIN/NYSTOP Apply 1 Application topically 3 (three) times daily.   nystatin cream Commonly known as: MYCOSTATIN Apply 1 Application topically 2 (  two) times daily.   OXYGEN Inhale 2 L into the lungs at bedtime. Can use in the morning as needed   predniSONE 20 MG tablet Commonly known as: DELTASONE Take 1 tablet (20 mg total) by mouth daily with breakfast for 5 days. Start taking on: January 27, 2022   traMADol 50 MG tablet Commonly known as: ULTRAM Take 50 mg by mouth every 12 (twelve) hours as needed for moderate pain.   Trelegy Ellipta 100-62.5-25 MCG/ACT Aepb Generic drug: Fluticasone-Umeclidin-Vilant INHALE (1) PUFF INTO THE LUNGS DAILY What changed: See the new instructions.   Ventolin HFA 108 (90 Base) MCG/ACT inhaler Generic drug: albuterol INHALE 2 PUFFS INTO THE LUNGS EVERY 4 HOURS AS NEEDED. What changed: See the new instructions.   vitamin C 100 MG tablet Take 100 mg by mouth daily.   Vitamin D 50 MCG (2000 UT) tablet Take 1 tablet (2,000 Units total) by mouth daily.        Follow-up Information     [facility] [place]. [facility] Follow up.   Specialty: Cardiology Why: Cardiology Follow-up with Dr. Johnsie Cancel on 03/19/2022 at 11:15 AM. Contact information: [address] [id number] [place]        Claretta Fraise, MD. Schedule an appointment as soon as possible for a visit in 1 week(s).   Specialty: Family Medicine Why: Hospital Follow Up Contact information: [place] [address] [phone]         Melissa Montane, MD. Schedule an appointment as soon as possible for a visit in 1 week(s).   Specialty: Otolaryngology Why: Hospital Follow Up Contact information: [address] [place] [address] [phone]         Montez Morita, Quillian Quince, MD. Schedule an appointment as soon as possible for a visit in 2 week(s).   Specialty: Gastroenterology Why: Hospital Follow Up Contact information: [address] [place] [address] [phone]                Allergies  Allergen Reactions   Plavix [Clopidogrel Bisulfate] Itching    Severe itching   Calcium Channel Blockers Other (See Comments)    cramping   Alprazolam Nausea And Vomiting   Codeine Nausea And Vomiting   Percodan [Oxycodone-Aspirin] Nausea And Vomiting   Valium Nausea And Vomiting   Allergies as of 01/26/2022       Reactions   Plavix [clopidogrel Bisulfate] Itching   Severe itching   Calcium Channel Blockers Other (See Comments)   cramping   Alprazolam Nausea And Vomiting   Codeine Nausea And Vomiting   Percodan [oxycodone-aspirin] Nausea And Vomiting   Valium Nausea And Vomiting        Medication List     STOP taking these medications    celecoxib 200 MG capsule Commonly known as: CeleBREX       TAKE these medications    alendronate 70 MG tablet Commonly known as: FOSAMAX Take 1 tablet (70 mg total) by mouth every 7 (seven) days. Take with a full glass of water on an empty stomach. Do not lay down for at least 2 hours   aspirin EC 81 MG tablet Take 81 mg by mouth every morning.   atorvastatin 40 MG tablet Commonly known as: LIPITOR Take 1 tablet (40 mg  total) by mouth every evening.   carvedilol 6.25 MG tablet Commonly known as: COREG TAKE 1 TABLET  BY MOUTH TWICE DAILY WITH MEALS.   clorazepate 7.5 MG tablet Commonly known as: TRANXENE Take 1 tablet (7.5 mg total) by mouth daily as needed for anxiety.   Cranberry 500 MG Tabs Take 1 tablet by mouth daily.   Entresto 24-26 MG Generic drug: sacubitril-valsartan Take 1 tablet by mouth 2 (two) times daily.   isosorbide mononitrate 30 MG 24 hr tablet Commonly known as: IMDUR Take 0.5 tablets (15 mg total) by mouth daily.   leflunomide 20 MG tablet Commonly known as: ARAVA Take 1 tablet (20 mg total) by mouth daily.   meclizine 25 MG tablet Commonly known as: ANTIVERT Take 1 tablet (25 mg total) by mouth as needed for dizziness.   nitroGLYCERIN 0.4 MG SL tablet Commonly known as: NITROSTAT Place 1 tablet (0.4 mg total) under the tongue every 5 (five) minutes x 3 doses as needed for chest pain.   nystatin powder Commonly known as: MYCOSTATIN/NYSTOP Apply 1 Application topically 3 (three) times daily.   nystatin cream Commonly known as: MYCOSTATIN Apply 1 Application topically 2 (two) times daily.   OXYGEN Inhale 2 L into the lungs at bedtime. Can use in the morning as needed   predniSONE 20 MG tablet Commonly known as: DELTASONE Take 1 tablet (20 mg total) by mouth daily with breakfast for 5 days. Start taking on: January 27, 2022   traMADol 50 MG tablet Commonly known as: ULTRAM Take 50 mg by mouth every 12 (twelve) hours as needed for moderate pain.   Trelegy Ellipta 100-62.5-25 MCG/ACT Aepb Generic drug: Fluticasone-Umeclidin-Vilant INHALE (1) PUFF INTO THE LUNGS DAILY What changed: See the new instructions.   Ventolin HFA 108 (90 Base) MCG/ACT inhaler Generic drug: albuterol INHALE 2 PUFFS INTO THE LUNGS EVERY 4 HOURS AS NEEDED. What changed: See the new instructions.   vitamin C 100 MG tablet Take 100 mg by mouth daily.   Vitamin D 50 MCG (2000 UT)  tablet Take 1 tablet (2,000 Units total) by mouth daily.        Procedures/Studies: ECHOCARDIOGRAM COMPLETE  Result Date: 01/25/2022    ECHOCARDIOGRAM REPORT   Patient Name:   [name] [facility] Date of Exam: 01/25/2022 Medical Rec #:  [id number]      Height:       62.0 in Accession #:    [id number]     Weight:       171.7 lb Date of Birth:  08/12/1935       BSA:          1.792 m Patient Age:    54 years       BP:           133/57 mmHg Patient Gender: F              HR:           88 bpm. Exam Location:  [facility] Procedure: 2D Echo, Cardiac Doppler and Color Doppler Indications:     Syncope  History:         Patient has prior history of Echocardiogram examinations, most                  recent 05/26/2021. CHF and Cardiomyopathy, Previous Myocardial                  Infarction and CAD, COPD, Signs/Symptoms:Chest Pain; Risk                  Factors:Hypertension and Dyslipidemia.  Sonographer:  Wenda Low Referring Phys:  [id number] ASIA B [place] Diagnosing Phys: Gwyndolyn Kaufman MD IMPRESSIONS  1. Left ventricular ejection fraction, by estimation, is 35 to 40%. The left ventricle has moderately decreased function. There is global hypokinesis that appears slightly worse in the basal-to-mid inferior and basal-to-mid inferoseptal segments. There is mild concentric left ventricular hypertrophy. Indeterminate diastolic filling due to E-A fusion. There is septal-lateral dyssynchrony in the setting of a LBBB.  2. Right ventricular systolic function is normal. The right ventricular size is normal. There is normal pulmonary artery systolic pressure. The estimated right ventricular systolic pressure is 31.5 mmHg.  3. Moderate pericardial effusion. The pericardial effusion is localized near the right ventricle and measures 1.6cm at max diameter. There is no evidence of cardiac tamponade. Compared to prior TTE, the effusion appears slightly larger on current study but still within moderate range. Recommend serial  monitoring with TTEs.  4. The mitral valve is normal in structure. Trivial mitral valve regurgitation.  5. The aortic valve was not well visualized. There is mild calcification of the aortic valve. There is mild thickening of the aortic valve. Aortic valve regurgitation is trivial. Aortic valve sclerosis/calcification is present, without any evidence of aortic stenosis. Aortic valve mean gradient measures 5.0 mmHg.  6. The inferior vena cava is normal in size with greater than 50% respiratory variability, suggesting right atrial pressure of 3 mmHg.  7. Left atrial size was mildly dilated. Comparison(s): Compared to prior TTE, there is no significant change. EF remains stable at 35-40%. There continues to be a moderate pericardial effusion that is slightly larger on current study. Recommend continued monitoring as outpatient. FINDINGS  Left Ventricle: Left ventricular ejection fraction, by estimation, is 35 to 40%. The left ventricle has moderately decreased function. The left ventricle demonstrates regional wall motion abnormalities. The left ventricular internal cavity size was normal in size. There is mild concentric left ventricular hypertrophy. Abnormal (paradoxical) septal motion, consistent with left bundle branch block. Indeterminate diastolic filling due to E-A fusion. Right Ventricle: The right ventricular size is normal. No increase in right ventricular wall thickness. Right ventricular systolic function is normal. There is normal pulmonary artery systolic pressure. The tricuspid regurgitant velocity is 2.11 m/s, and  with an assumed right atrial pressure of 3 mmHg, the estimated right ventricular systolic pressure is 40.0 mmHg. Left Atrium: Left atrial size was mildly dilated. Right Atrium: Right atrial size was normal in size. Pericardium: A moderately sized pericardial effusion is present. The pericardial effusion is localized near the right ventricle. There is no evidence of cardiac tamponade. Mitral  Valve: The mitral valve is normal in structure. There is mild thickening of the mitral valve leaflet(s). There is mild calcification of the mitral valve leaflet(s). Mild mitral annular calcification. Trivial mitral valve regurgitation. MV peak gradient, 7.5 mmHg. The mean mitral valve gradient is 3.0 mmHg. Tricuspid Valve: The tricuspid valve is normal in structure. Tricuspid valve regurgitation is trivial. Aortic Valve: The aortic valve was not well visualized. There is mild calcification of the aortic valve. There is mild thickening of the aortic valve. Aortic valve regurgitation is trivial. Aortic valve sclerosis/calcification is present, without any evidence of aortic stenosis. Aortic valve mean gradient measures 5.0 mmHg. Aortic valve peak gradient measures 9.1 mmHg. Aortic valve area, by VTI measures 2.02 cm. Pulmonic Valve: The pulmonic valve was not well visualized. Aorta: The aortic root and ascending aorta are structurally normal, with no evidence of dilitation. Venous: The inferior vena cava is normal in size with  greater than 50% respiratory variability, suggesting right atrial pressure of 3 mmHg. IAS/Shunts: The atrial septum is grossly normal.  LEFT VENTRICLE PLAX 2D LVIDd:         5.20 cm LVIDs:         4.10 cm LV PW:         1.10 cm LV IVS:        1.20 cm LVOT diam:     2.00 cm LV SV:         53 LV SV Index:   30 LVOT Area:     3.14 cm  LV Volumes (MOD) LV vol d, MOD A2C: 76.0 ml LV vol d, MOD A4C: 90.2 ml LV vol s, MOD A2C: 39.5 ml LV vol s, MOD A4C: 51.1 ml LV SV MOD A2C:     36.5 ml LV SV MOD A4C:     90.2 ml LV SV MOD BP:      37.9 ml RIGHT VENTRICLE RV Basal diam:  3.20 cm RV Mid diam:    3.00 cm RV S prime:     13.10 cm/s TAPSE (M-mode): 2.3 cm LEFT ATRIUM           Index        RIGHT ATRIUM           Index LA diam:      3.00 cm 1.67 cm/m   RA Area:     12.20 cm LA Vol (A4C): 54.9 ml 30.64 ml/m  RA Volume:   22.70 ml  12.67 ml/m  AORTIC VALVE AV Area (Vmax):    2.03 cm AV Area (Vmean):    1.92 cm AV Area (VTI):     2.02 cm AV Vmax:           151.00 cm/s AV Vmean:          103.000 cm/s AV VTI:            0.264 m AV Peak Grad:      9.1 mmHg AV Mean Grad:      5.0 mmHg LVOT Vmax:         97.40 cm/s LVOT Vmean:        62.900 cm/s LVOT VTI:          0.170 m LVOT/AV VTI ratio: 0.64  AORTA Ao Root diam: 3.40 cm Ao Asc diam:  2.70 cm MITRAL VALVE                TRICUSPID VALVE MV Area (PHT): 4.63 cm     TR Peak grad:   17.8 mmHg MV Area VTI:   1.98 cm     TR Vmax:        211.00 cm/s MV Peak grad:  7.5 mmHg MV Mean grad:  3.0 mmHg     SHUNTS MV Vmax:       1.37 m/s     Systemic VTI:  0.17 m MV Vmean:      76.6 cm/s    Systemic Diam: 2.00 cm MV Decel Time: 164 msec MV E velocity: 106.00 cm/s Gwyndolyn Kaufman MD Electronically signed by Gwyndolyn Kaufman MD Signature Date/Time: 01/25/2022/11:35:11 AM    Final (Updated)    CT Angio Chest PE W and/or Wo Contrast  Result Date: 01/24/2022 CLINICAL DATA:  High probability for pulmonary embolism. Chest pain. EXAM: CT ANGIOGRAPHY CHEST WITH CONTRAST TECHNIQUE: Multidetector CT imaging of the chest was performed using the standard protocol during bolus administration of intravenous contrast. Multiplanar CT image reconstructions and  MIPs were obtained to evaluate the vascular anatomy. RADIATION DOSE REDUCTION: This exam was performed according to the departmental dose-optimization program which includes automated exposure control, adjustment of the mA and/or kV according to patient size and/or use of iterative reconstruction technique. CONTRAST:  150m OMNIPAQUE IOHEXOL 350 MG/ML SOLN COMPARISON:  CT of the chest 11/11/2017. Thyroid ultrasound 07/24/2017. FINDINGS: Cardiovascular: There is adequate opacification of the pulmonary arteries. There is no evidence for pulmonary embolism. There is aneurysmal dilatation of the ascending aorta measuring 4.3 cm. The heart is mildly enlarged. There is a trace pericardial effusion. There are atherosclerotic calcifications  of the aorta and coronary arteries. Mediastinum/Nodes: There is a stable 12 mm hypodense right thyroid nodule. There are no enlarged mediastinal or hilar lymph nodes. The esophagus is within normal limits. Lungs/Pleura: Lungs are clear. No pleural effusion or pneumothorax. Upper Abdomen: Hypodense lesion in the right lobe of the liver measuring up to 2.3 cm appears unchanged. This was previously characterized as hemangioma. Gallbladder surgically absent. Musculoskeletal: Cervical spinal fusion plate is partially visualized. The bones are osteopenic. No acute fractures are seen. Degenerative changes affect the spine. Left shoulder arthroplasty is present. Review of the MIP images confirms the above findings. IMPRESSION: 1. No evidence for pulmonary embolism. 2. Stable aneurysmal dilatation of the ascending aorta measuring 4.3 cm. Recommend annual imaging followup by CTA or MRA. This recommendation follows 2010 ACCF/AHA/AATS/ACR/ASA/SCA/SCAI/SIR/STS/SVM Guidelines for the Diagnosis and Management of Patients with Thoracic Aortic Disease. Circulation. 2010; 121:: C376-E831 Aortic aneurysm NOS (ICD10-I71.9) 3. Stable hypodense right thyroid nodule.  No follow-up necessary. Electronically Signed   By: ARonney AstersM.D.   On: 01/24/2022 19:02   CT Soft Tissue Neck W Contrast  Result Date: 01/24/2022 CLINICAL DATA:  Hoarseness EXAM: CT NECK WITH CONTRAST TECHNIQUE: Multidetector CT imaging of the neck was performed using the standard protocol following the bolus administration of intravenous contrast. RADIATION DOSE REDUCTION: This exam was performed according to the departmental dose-optimization program which includes automated exposure control, adjustment of the mA and/or kV according to patient size and/or use of iterative reconstruction technique. CONTRAST:  1027mOMNIPAQUE IOHEXOL 350 MG/ML SOLN COMPARISON:  None Available. FINDINGS: Pharynx and larynx: Pharynx is normal. There is mild circumferential soft  tissue thickening of the supraglottic larynx. The epiglottis is normal. There is mass effect on the upper esophagus related to anterior osteophyte at C4-5 and C5-7 ACDF. Salivary glands: No inflammation, mass, or stone. Thyroid: Normal. Lymph nodes: None enlarged or abnormal density. Vascular: Negative. Limited intracranial: Negative. Visualized orbits: Negative. Mastoids and visualized paranasal sinuses: Clear. Skeleton: C5-7 ACDF.  C4-5 large anterior osteophyte. Upper chest: Clear Other: None. IMPRESSION: 1. Mild circumferential soft tissue thickening of the supraglottic larynx. By provided history, the patient has had a normal laryngoscopic exam. If this information is incorrect, laryngoscopy is recommended. 2. Mass effect on the upper esophagus related to anterior osteophyte at C4-5 and C5-7 ACDF. Electronically Signed   By: KeUlyses Jarred.D.   On: 01/24/2022 18:59   DG Chest 2 View  Result Date: 01/24/2022 CLINICAL DATA:  Chest pain. EXAM: CHEST - 2 VIEW COMPARISON:  July 13, 2021. FINDINGS: Stable cardiomegaly. Hyperexpansion of the lungs is noted. Status post left shoulder arthroplasty. Lungs are clear. IMPRESSION: No acute cardiopulmonary disease.  Hyperexpansion of the lungs. Electronically Signed   By: JaMarijo Conception.D.   On: 01/24/2022 13:35     Subjective: Patient reports that she is ready to go home.  She denies chest pain and  shortness of breath she has been ambulating in the room with no difficulty.  Discharge Exam: Vitals:   01/26/22 0518 01/26/22 0756  BP: (!) 115/48   Pulse: 66   Resp: 17   Temp: 97.8 F (36.6 C)   SpO2: 94% 94%   Vitals:   01/25/22 2132 01/26/22 0452 01/26/22 0518 01/26/22 0756  BP: (!) 94/53  (!) 115/48   Pulse: 73  66   Resp: 18  17   Temp: 98.3 F (36.8 C)  97.8 F (36.6 C)   TempSrc: Oral     SpO2: 94%  94% 94%  Weight:  81.6 kg    Height:        General: Pt is elderly, frail alert, awake, not in acute distress Cardiovascular: RRR, S1/S2  +, no rubs, no gallops Respiratory: CTA bilaterally, no wheezing, no rhonchi Abdominal: Soft, NT, ND, bowel sounds + Extremities: no edema, no cyanosis   The results of significant diagnostics from this hospitalization (including imaging, microbiology, ancillary and laboratory) are listed below for reference.     Microbiology: No results found for this or any previous visit (from the past 240 hour(s)).   Labs: BNP (last 3 results) No results for input(s): "BNP" in the last 8760 hours. Basic Metabolic Panel: Recent Labs  Lab 01/24/22 1342 01/25/22 0507  NA 141 137  K 4.1 3.8  CL 107 107  CO2 25 24  GLUCOSE 103* 207*  BUN 12 17  CREATININE 0.67 0.61  CALCIUM 9.5 9.3  MG  --  2.1   Liver Function Tests: Recent Labs  Lab 01/25/22 0507  AST 17  ALT 12  ALKPHOS 50  BILITOT 0.8  PROT 6.3*  ALBUMIN 3.5   No results for input(s): "LIPASE", "AMYLASE" in the last 168 hours. No results for input(s): "AMMONIA" in the last 168 hours. CBC: Recent Labs  Lab 01/24/22 1342 01/25/22 0507  WBC 5.4 3.4*  NEUTROABS  --  2.7  HGB 11.7* 11.6*  HCT 36.6 35.7*  MCV 83.9 83.6  PLT 274 239   Cardiac Enzymes: No results for input(s): "CKTOTAL", "CKMB", "CKMBINDEX", "TROPONINI" in the last 168 hours. BNP: Invalid input(s): "POCBNP" CBG: Recent Labs  Lab 01/25/22 0529 01/25/22 1638 01/25/22 2138 01/26/22 0455  GLUCAP 211* 167* 135* 134*   D-Dimer No results for input(s): "DDIMER" in the last 72 hours. Hgb A1c No results for input(s): "HGBA1C" in the last 72 hours. Lipid Profile Recent Labs    01/25/22 0507  CHOL 205*  HDL 76  LDLCALC 122*  TRIG 35  CHOLHDL 2.7   Thyroid function studies Recent Labs    01/24/22 1641  TSH 0.682   Anemia work up No results for input(s): "VITAMINB12", "FOLATE", "FERRITIN", "TIBC", "IRON", "RETICCTPCT" in the last 72 hours. Urinalysis    Component Value Date/Time   COLORURINE YELLOW 11/22/2020 1238   APPEARANCEUR Clear  12/13/2021 1600   LABSPEC 1.006 11/22/2020 1238   PHURINE 6.0 11/22/2020 1238   GLUCOSEU Negative 12/13/2021 1600   HGBUR SMALL (A) 11/22/2020 1238   BILIRUBINUR Negative 12/13/2021 1600   KETONESUR small (15) (A) 03/30/2021 1513   KETONESUR NEGATIVE 11/22/2020 1238   PROTEINUR Negative 12/13/2021 1600   PROTEINUR NEGATIVE 11/22/2020 1238   UROBILINOGEN 0.2 03/30/2021 1513   UROBILINOGEN 0.2 08/20/2013 1610   NITRITE Negative 12/13/2021 1600   NITRITE NEGATIVE 11/22/2020 1238   LEUKOCYTESUR Negative 12/13/2021 1600   LEUKOCYTESUR LARGE (A) 11/22/2020 1238   Sepsis Labs Recent Labs  Lab 01/24/22 1342  01/25/22 0507  WBC 5.4 3.4*   Microbiology No results found for this or any previous visit (from the past 240 hour(s)).  Time coordinating discharge: 38 minutes  SIGNED:  Irwin Brakeman, MD  [facility] 01/26/2022, 10:17 AM How to contact the [facility] Attending or Consulting provider [place] or covering provider during after hours [place], for this patient?  Check the care team in [facility] and look for a) attending/consulting [facility] provider listed and b) the [facility] team listed Log into [email or web address] and use [place]'s universal password to access. If you do not have the password, please contact the hospital operator. Locate the [facility] provider you are looking for under [facility] and page to a number that you can be directly reached. If you still have difficulty reaching the provider, please page the [facility] (Director on Call) for the Hospitalists listed on amion for assistance.

## 2022-01-26 NOTE — Hospital Course (Signed)
86 y.o. female with medical history significant of chronic respiratory failure on 2 L nasal cannula at home, coronary artery disease, CHF, COPD, and more presents to ED with a chief complaint of hypoxia and lightheadedness.  Patient reports that she felt lightheaded so she quickly checked her oxygen and her O2 sats were in the 80s.  She put on her as needed oxygen at that time.  She reports that it did not feel like when she has had a heart attack in the past.  In the past when she had a heart attack she had chest tightness with diaphoresis, that was 5 years ago.  She did have left arm tingling today which made her think of her heart, so when she called in and she was told to come to the ER upon describing that.  She had no diaphoresis.  Patient did report lightheaded, and thinks she may have passed out if she did not find a place to sit down.  She attributes this to hypoxia with her oxygen sats in the 80s.  Patient reports that when she called and she was told to take 4 baby aspirin, so she did that prior to arrival.  She has nitroglycerin at home but she did not take it.  She did feel short of breath but did not use her rescue inhaler.  Patient reports at this time all of her symptoms have resolved.  She is not sure what made them better.  Patient does report that she has had worsening of her voice for several days.  For this reason a CT scan was done of her neck that showed supraglottic larynx thickening.  Patient has no other complaints at this time.   Patient does not smoke, does not drink, does not use illicit drugs.  She is vaccinated for COVID.  Patient is full code.

## 2022-01-26 NOTE — Progress Notes (Addendum)
Rounding Note    Patient Name: [name] Date of Encounter: 01/26/2022  [place] Cardiologist: Jenkins Rouge, MD   Subjective   Feels some discomfort in her throat this morning. Has ambulated in the hallway with no recurrent chest pain. Breathing improved. No pitting edema. Anxious to go home.    Inpatient Medications    Scheduled Meds:  aspirin EC  81 mg Oral BH-q7a   atorvastatin  40 mg Oral QPM   carvedilol  6.25 mg Oral BID WC   fluticasone furoate-vilanterol  1 puff Inhalation Daily   And   umeclidinium bromide  1 puff Inhalation Daily   heparin  5,000 Units Subcutaneous Q8H   insulin aspart  0-9 Units Subcutaneous TID WC   insulin aspart  3 Units Subcutaneous TID WC   isosorbide mononitrate  15 mg Oral Daily   methylPREDNISolone (SOLU-MEDROL) injection  40 mg Intravenous Q24H   pantoprazole  40 mg Oral QPM   sacubitril-valsartan  1 tablet Oral BID   sodium chloride flush  3 mL Intravenous Q12H   Continuous Infusions:  PRN Meds: acetaminophen **OR** acetaminophen, albuterol, clorazepate, morphine injection, ondansetron **OR** ondansetron (ZOFRAN) IV, oxyCODONE   Vital Signs    Vitals:   01/25/22 1212 01/25/22 2132 01/26/22 0452 01/26/22 0518  BP: (!) 96/57 (!) 94/53  (!) 115/48  Pulse: 81 73  66  Resp: '20 18  17  '$ Temp: 97.7 F (36.5 C) 98.3 F (36.8 C)  97.8 F (36.6 C)  TempSrc: Oral Oral    SpO2: 95% 94%  94%  Weight:   81.6 kg   Height:        Intake/Output Summary (Last 24 hours) at 01/26/2022 0757 Last data filed at 01/26/2022 0500 Gross per 24 hour  Intake 480 ml  Output --  Net 480 ml      01/26/2022    4:52 AM 01/25/2022    5:00 AM 01/25/2022   12:42 AM  Last 3 Weights  Weight (lbs) 179 lb 14.3 oz 171 lb 11.8 oz 171 lb 12.8 oz  Weight (kg) 81.6 kg 77.9 kg 77.928 kg      Telemetry    NSR, HR in 70's to 80's. Occasional PVC's and couplets.   - Personally Reviewed  ECG    No new tracings.   Physical Exam   GEN:  Pleasant elderly female appearing in no acute distress.   Neck: No JVD Cardiac: RRR, no murmurs, rubs, or gallops.  Respiratory: Clear to auscultation bilaterally. GI: Soft, nontender, non-distended  MS: No pitting edema; No deformity. Neuro:  Nonfocal  Psych: Normal affect   Labs    High Sensitivity Troponin:   Recent Labs  Lab 01/24/22 1342 01/24/22 1707  TROPONINIHS 11 10     Chemistry Recent Labs  Lab 01/24/22 1342 01/25/22 0507  NA 141 137  K 4.1 3.8  CL 107 107  CO2 25 24  GLUCOSE 103* 207*  BUN 12 17  CREATININE 0.67 0.61  CALCIUM 9.5 9.3  MG  --  2.1  PROT  --  6.3*  ALBUMIN  --  3.5  AST  --  17  ALT  --  12  ALKPHOS  --  50  BILITOT  --  0.8  GFRNONAA >60 >60  ANIONGAP 9 6    Lipids  Recent Labs  Lab 01/25/22 0507  CHOL 205*  TRIG 35  HDL 76  LDLCALC 122*  CHOLHDL 2.7    Hematology Recent Labs  Lab 01/24/22 1342 01/25/22 0507  WBC 5.4 3.4*  RBC 4.36 4.27  HGB 11.7* 11.6*  HCT 36.6 35.7*  MCV 83.9 83.6  MCH 26.8 27.2  MCHC 32.0 32.5  RDW 15.4 15.4  PLT 274 239   Thyroid  Recent Labs  Lab 01/24/22 1641  TSH 0.682    BNPNo results for input(s): "BNP", "PROBNP" in the last 168 hours.  DDimer No results for input(s): "DDIMER" in the last 168 hours.   Radiology   CT Angio Chest PE W and/or Wo Contrast  Result Date: 01/24/2022 CLINICAL DATA:  High probability for pulmonary embolism. Chest pain. EXAM: CT ANGIOGRAPHY CHEST WITH CONTRAST TECHNIQUE: Multidetector CT imaging of the chest was performed using the standard protocol during bolus administration of intravenous contrast. Multiplanar CT image reconstructions and MIPs were obtained to evaluate the vascular anatomy. RADIATION DOSE REDUCTION: This exam was performed according to the departmental dose-optimization program which includes automated exposure control, adjustment of the mA and/or kV according to patient size and/or use of iterative reconstruction technique. CONTRAST:  159m  OMNIPAQUE IOHEXOL 350 MG/ML SOLN COMPARISON:  CT of the chest 11/11/2017. Thyroid ultrasound 07/24/2017. FINDINGS: Cardiovascular: There is adequate opacification of the pulmonary arteries. There is no evidence for pulmonary embolism. There is aneurysmal dilatation of the ascending aorta measuring 4.3 cm. The heart is mildly enlarged. There is a trace pericardial effusion. There are atherosclerotic calcifications of the aorta and coronary arteries. Mediastinum/Nodes: There is a stable 12 mm hypodense right thyroid nodule. There are no enlarged mediastinal or hilar lymph nodes. The esophagus is within normal limits. Lungs/Pleura: Lungs are clear. No pleural effusion or pneumothorax. Upper Abdomen: Hypodense lesion in the right lobe of the liver measuring up to 2.3 cm appears unchanged. This was previously characterized as hemangioma. Gallbladder surgically absent. Musculoskeletal: Cervical spinal fusion plate is partially visualized. The bones are osteopenic. No acute fractures are seen. Degenerative changes affect the spine. Left shoulder arthroplasty is present. Review of the MIP images confirms the above findings. IMPRESSION: 1. No evidence for pulmonary embolism. 2. Stable aneurysmal dilatation of the ascending aorta measuring 4.3 cm. Recommend annual imaging followup by CTA or MRA. This recommendation follows 2010 ACCF/AHA/AATS/ACR/ASA/SCA/SCAI/SIR/STS/SVM Guidelines for the Diagnosis and Management of Patients with Thoracic Aortic Disease. Circulation. 2010; 121:: S283-T517 Aortic aneurysm NOS (ICD10-I71.9) 3. Stable hypodense right thyroid nodule.  No follow-up necessary. Electronically Signed   By: ARonney AstersM.D.   On: 01/24/2022 19:02   CT Soft Tissue Neck W Contrast  Result Date: 01/24/2022 CLINICAL DATA:  Hoarseness EXAM: CT NECK WITH CONTRAST TECHNIQUE: Multidetector CT imaging of the neck was performed using the standard protocol following the bolus administration of intravenous contrast.  RADIATION DOSE REDUCTION: This exam was performed according to the departmental dose-optimization program which includes automated exposure control, adjustment of the mA and/or kV according to patient size and/or use of iterative reconstruction technique. CONTRAST:  1061mOMNIPAQUE IOHEXOL 350 MG/ML SOLN COMPARISON:  None Available. FINDINGS: Pharynx and larynx: Pharynx is normal. There is mild circumferential soft tissue thickening of the supraglottic larynx. The epiglottis is normal. There is mass effect on the upper esophagus related to anterior osteophyte at C4-5 and C5-7 ACDF. Salivary glands: No inflammation, mass, or stone. Thyroid: Normal. Lymph nodes: None enlarged or abnormal density. Vascular: Negative. Limited intracranial: Negative. Visualized orbits: Negative. Mastoids and visualized paranasal sinuses: Clear. Skeleton: C5-7 ACDF.  C4-5 large anterior osteophyte. Upper chest: Clear Other: None. IMPRESSION: 1. Mild circumferential soft tissue thickening of the supraglottic  larynx. By provided history, the patient has had a normal laryngoscopic exam. If this information is incorrect, laryngoscopy is recommended. 2. Mass effect on the upper esophagus related to anterior osteophyte at C4-5 and C5-7 ACDF. Electronically Signed   By: Ulyses Jarred M.D.   On: 01/24/2022 18:59   DG Chest 2 View  Result Date: 01/24/2022 CLINICAL DATA:  Chest pain. EXAM: CHEST - 2 VIEW COMPARISON:  July 13, 2021. FINDINGS: Stable cardiomegaly. Hyperexpansion of the lungs is noted. Status post left shoulder arthroplasty. Lungs are clear. IMPRESSION: No acute cardiopulmonary disease.  Hyperexpansion of the lungs. Electronically Signed   By: Marijo Conception M.D.   On: 01/24/2022 13:35    Cardiac Studies   NST: 07/2021   Findings are consistent with prior inferior myocardial infarction. Evidence of prior apical myocardial infarction with mild peri-infarct ischemia. The study is intermediate risk. Risk based primarily on  decreased LVEF, there is fairly mild current ischemia.   No ST deviation was noted.   LV perfusion is abnormal.   Left ventricular function is abnormal. Nuclear stress EF: 36 %. The left ventricular ejection fraction is moderately decreased (30-44%).  Echocardiogram: 01/25/2022 IMPRESSIONS     1. Left ventricular ejection fraction, by estimation, is 35 to 40%. The  left ventricle has moderately decreased function. There is global  hypokinesis that appears slightly worse in the basal-to-mid inferior and  basal-to-mid inferoseptal segments. There  is mild concentric left ventricular hypertrophy. Indeterminate diastolic  filling due to E-A fusion. There is septal-lateral dyssynchrony in the  setting of a LBBB.   2. Right ventricular systolic function is normal. The right ventricular  size is normal. There is normal pulmonary artery systolic pressure. The  estimated right ventricular systolic pressure is 82.5 mmHg.   3. Moderate pericardial effusion. The pericardial effusion is localized  near the right ventricle and measures 1.6cm at max diameter. There is no  evidence of cardiac tamponade. Compared to prior TTE, the effusion appears  slightly larger on current study  but still within moderate range. Recommend serial monitoring with TTEs.   4. The mitral valve is normal in structure. Trivial mitral valve  regurgitation.   5. The aortic valve was not well visualized. There is mild calcification  of the aortic valve. There is mild thickening of the aortic valve. Aortic  valve regurgitation is trivial. Aortic valve sclerosis/calcification is  present, without any evidence of  aortic stenosis. Aortic valve mean gradient measures 5.0 mmHg.   6. The inferior vena cava is normal in size with greater than 50%  respiratory variability, suggesting right atrial pressure of 3 mmHg.   7. Left atrial size was mildly dilated.   Comparison(s): Compared to prior TTE, there is no significant change. EF   remains stable at 35-40%. There continues to be a moderate pericardial  effusion that is slightly larger on current study. Recommend continued  monitoring as outpatient.   Patient Profile     86 y.o. female w/PMH of CAD (s/p DES to mid-LAD and DES to mid-OM1 in 11/2016, mild peri-infarct ischemia by NST in 07/2021 and medical management pursued), HFrEF (EF 40-45% in 2018, at 45% by repeat echo in 12/2018 and 35-40% in 05/2021), HTN, HLD, RA, dilation of ascending aorta (at 4.2 cm by imaging in 2019) and COPD who is currently admitted for evaluation of presyncope.   Assessment & Plan    1. Chest Pain/CAD - She has known CAD with DES to mid-LAD and DES to mid-OM1 in  11/2016 and most recent ischemic evaluation was a recent NST in 07/2021 which showed mild peri-infarct ischemia. - Hs Troponin values have been negative this admission and repeat echo is similar to prior imaging. Given her isolated episode that spontaneously resolved prior to admission, continued medical management has been recommended. - Continue ASA 81 mg daily, Coreg 6.25 mg twice daily, Atorvastatin 40 mg daily and Imdur '15mg'$  daily.    2. HFrEF - Her EF was at 35-40% by echo in 05/2021 and remains similar by repeat echo this admission.  - She does not appear volume overloaded on examination today. Continue current medical therapy with Coreg 6.'25mg'$  BID and Entresto 24-'26mg'$  BID. Cannot add Spironolactone at this time given her BP (at 94/53 - 115/48 within the past 24 hours). Would not add an SGLT2 inhibitor as she reports having frequent UTI's.   3. Presyncope - Reported intermittent dizziness for several years which typically occurs with moving her head but can be associated with positional changes as well. Orthostatics negative this admission. Echo is without acute changes when compared to her prior study.  - Can consider an outpatient monitor given her frequent PVC's but no plans for further inpatient cardiac testing.   4.  Dilation of Ascending Aorta  - Measured at 4.2 cm by imaging in 2019 and overall stable at 4.3 cm by imaging this admission. Will continue to follow as an outpatient.     5. Pericardial Effusion - This was moderate by echo in 05/2021 and remains moderate in size by repeat echo this admission.    6. Abnormal CT - CT Neck this admission showed soft tissue thickening of the supraglottic larynx and she was started on steroids at the recommendation of ENT.    Will arrange for closer Cardiology follow-up with Dr. Johnsie Cancel in 6-8 weeks. Will likely need a repeat limited echo at that time for reassessment of her pericardial effusion.    For questions or updates, please contact [place] Please consult [email or web address] for contact info under     Signed, Erma Heritage, PA-C  01/26/2022, 7:57 AM    Personally seen and examined. Agree with APP above with the following comments: 86 yo with hx of CAD s/p prior PCI, HfmrEF, pericardial effusion without tamponade  with memory issues who presents for repeat CP. Notes issues swallowing but no angina, eager to leave. No JVD, BP is stable but low.  Otherwise as above. Tele: rare PVCs Would recommend - working to expedite f/u and repeat echo - if angina, can increase to imdur 30 mg - otherwise as above  Rudean Haskell, MD [address] [place] [place], [place] [address] [phone]  9:56 AM

## 2022-01-26 NOTE — Care Management Obs Status (Signed)
[facility] NOTIFICATION   Patient Details  Name: [name] MRN: [id number] Date of Birth: 03/02/1936   Medicare Observation Status Notification Given:  Yes    Tommy Medal 01/26/2022, 10:37 AM

## 2022-01-26 NOTE — Discharge Instructions (Signed)
IMPORTANT INFORMATION: PAY CLOSE ATTENTION  ? ?PHYSICIAN DISCHARGE INSTRUCTIONS ? ?Follow with Primary care provider  Stacks, Warren, MD  and other consultants as instructed by your Hospitalist Physician ? ?SEEK MEDICAL CARE OR RETURN TO EMERGENCY ROOM IF SYMPTOMS COME BACK, WORSEN OR NEW PROBLEM DEVELOPS  ? ?Please note: ?You were cared for by a hospitalist during your hospital stay. Every effort will be made to forward records to your primary care provider.  You can request that your primary care provider send for your hospital records if they have not received them.  Once you are discharged, your primary care physician will handle any further medical issues. Please note that NO REFILLS for any discharge medications will be authorized once you are discharged, as it is imperative that you return to your primary care physician (or establish a relationship with a primary care physician if you do not have one) for your post hospital discharge needs so that they can reassess your need for medications and monitor your lab values. ? ?Please get a complete blood count and chemistry panel checked by your Primary MD at your next visit, and again as instructed by your Primary MD. ? ?Get Medicines reviewed and adjusted: ?Please take all your medications with you for your next visit with your Primary MD ? ?Laboratory/radiological data: ?Please request your Primary MD to go over all hospital tests and procedure/radiological results at the follow up, please ask your primary care provider to get all Hospital records sent to his/her office. ? ?In some cases, they will be blood work, cultures and biopsy results pending at the time of your discharge. Please request that your primary care provider follow up on these results. ? ?If you are diabetic, please bring your blood sugar readings with you to your follow up appointment with primary care.   ? ?Please call and make your follow up appointments as soon as possible.   ? ?Also Note  the following: ?If you experience worsening of your admission symptoms, develop shortness of breath, life threatening emergency, suicidal or homicidal thoughts you must seek medical attention immediately by calling 911 or calling your MD immediately  if symptoms less severe. ? ?You must read complete instructions/literature along with all the possible adverse reactions/side effects for all the Medicines you take and that have been prescribed to you. Take any new Medicines after you have completely understood and accpet all the possible adverse reactions/side effects.  ? ?Do not drive when taking Pain medications or sleeping medications (Benzodiazepines) ? ?Do not take more than prescribed Pain, Sleep and Anxiety Medications. It is not advisable to combine anxiety,sleep and pain medications without talking with your primary care practitioner ? ?Special Instructions: If you have smoked or chewed Tobacco  in the last 2 yrs please stop smoking, stop any regular Alcohol  and or any Recreational drug use. ? ?Wear Seat belts while driving.  Do not drive if taking any narcotic, mind altering or controlled substances or recreational drugs or alcohol.  ? ? ? ? ? ?

## 2022-01-29 ENCOUNTER — Telehealth: Payer: Self-pay | Admitting: Family Medicine

## 2022-01-29 NOTE — Telephone Encounter (Signed)
Appointment given for 02/02/22 @ 9:20 with [facility].

## 2022-01-29 NOTE — Telephone Encounter (Signed)
Patient calling to schedule hospital follow up. Please call back.

## 2022-01-30 ENCOUNTER — Encounter (INDEPENDENT_AMBULATORY_CARE_PROVIDER_SITE_OTHER): Payer: Self-pay | Admitting: *Deleted

## 2022-01-31 ENCOUNTER — Telehealth: Payer: Self-pay | Admitting: *Deleted

## 2022-01-31 ENCOUNTER — Ambulatory Visit (INDEPENDENT_AMBULATORY_CARE_PROVIDER_SITE_OTHER): Payer: Medicare Other | Admitting: Gastroenterology

## 2022-01-31 ENCOUNTER — Encounter: Payer: Self-pay | Admitting: Gastroenterology

## 2022-01-31 DIAGNOSIS — R1312 Dysphagia, oropharyngeal phase: Secondary | ICD-10-CM | POA: Insufficient documentation

## 2022-01-31 DIAGNOSIS — I2510 Atherosclerotic heart disease of native coronary artery without angina pectoris: Secondary | ICD-10-CM

## 2022-01-31 NOTE — Patient Instructions (Signed)
We are arranging a special xray of your esophagus.  Continue to chew well, take small bites, and sit upright while eating.  I recommend keeping the appointment with the Ear, Nose, and Throat specialist!  It was a pleasure to see you today. I want to create trusting relationships with patients to provide genuine, compassionate, and quality care. I value your feedback. If you receive a survey regarding your visit,  I greatly appreciate you taking time to fill this out.   Annitta Needs, PhD, ANP-BC [facility]

## 2022-01-31 NOTE — Progress Notes (Signed)
Gastroenterology Office Note    Referring Provider: Claretta Fraise, MD Primary Care Physician:  Claretta Fraise, MD  Primary GI: previously Dr. Laural Golden, last seen in 2018     Chief Complaint   Chief Complaint  Patient presents with   Hospitalization Follow-up    Patient here today for a hospital follow up from 01/24/2022, due to hoarseness of voice. She is still having issues with this and says she feels some tightness in the throat, she says she get strangled on food a lot. Has some occasional nausea. No other gi issues.     History of Present Illness   [name] is an 86 y.o. female presenting today at the request of Claretta Fraise, MD due to dysphagia. She was seen in the ED recently with near syncope and chest discomfort. CT soft tissue neck last week with mild circumerential soft tissue thickening of supraglottic larynx. Recommendations to see ENT. There was also mass effect on upper esophagus r/t anterior osteophyte at C4-5 and C5-7   ENT appt should be upcoming. Dr. Laural Golden dilated esophagus in the past. Noted improvement historically.   Has noted chronic dysphagia. Gets strangled easily with liquids at times. No odynophagia. Feels tight at neck area. Chews food really well and makes sure it is mushy. No dysphagia if chewing food well and taking small bites.     Colonoscopy 2016 under fluoro by Dr. Laural Golden: excellent prep. Incomplete exam to the distal transverse colon secondary to angulated loop and sigmoid colon which could not be reduced. Scattered sigmoid diverticula. Followed by virtual colonoscopy without obvious mass.            Past Medical History:  Diagnosis Date   Acute on chronic respiratory failure with hypoxia (HCC) 04/24/2018   Anxiety    Atypical chest pain    Bronchospasm    CAD (coronary artery disease)    a. s/p DES to mid-LAD and DES to mid-OM1 in 11/2016   Cervical disc disorder with myelopathy, unspecified cervical region     Cervicalgia 01/19/2009   Qualifier: Diagnosis of  By: Aline Brochure MD, [place]     Chest pain at rest 0/98/1191   Chronic systolic (congestive) heart failure (HCC)    COPD (chronic obstructive pulmonary disease) (HCC)    Coronary artery disease    De Quervain's disease (tenosynovitis) 04/02/2011   Disc disease with myelopathy, cervical    Essential hypertension    Hemorrhoids    Liver mass    Lung, cysts, congenital    Left lung cyst   Myocardial infarction ([place])    Nephrolithiasis    Embedded   Nonischemic cardiomyopathy ([place])    LVEF 35-40% 2015   On home O2    Osteoarthritis    Sprain of wrist 08/07/2012   Thoracic ascending aortic aneurysm (HCC)    4.3 cm April 2016    Past Surgical History:  Procedure Laterality Date   Benign breast tumors     CHOLECYSTECTOMY     COLONOSCOPY     COLONOSCOPY N/A 09/22/2014   Procedure: COLONOSCOPY;  Surgeon: Rogene Houston, MD;  Location: [facility];  Service: Endoscopy;  Laterality: N/A;  830 -- to be done in OR under fluoro   Complete hysterectomy     CORONARY STENT INTERVENTION N/A 11/23/2016   Procedure: Coronary Stent Intervention;  Surgeon: Martinique, Peter M, MD;  Location: [place] [facility];  Service: Cardiovascular;  Laterality: N/A;   LEFT HEART CATH AND CORONARY  ANGIOGRAPHY N/A 11/23/2016   Procedure: Left Heart Cath and Coronary Angiography;  Surgeon: Martinique, Peter M, MD;  Location: [place] [facility];  Service: Cardiovascular;  Laterality: N/A;   TONSILLECTOMY AND ADENOIDECTOMY      Current Outpatient Medications  Medication Sig Dispense Refill   albuterol (VENTOLIN HFA) 108 (90 Base) MCG/ACT inhaler INHALE 2 PUFFS INTO THE LUNGS EVERY 4 HOURS AS NEEDED. (Patient taking differently: Inhale 2 puffs into the lungs every 4 (four) hours as needed for wheezing or shortness of breath.) 18 g 2   alendronate (FOSAMAX) 70 MG tablet Take 1 tablet (70 mg total) by mouth every 7 (seven) days. Take with a full glass of water on an empty  stomach. Do not lay down for at least 2 hours 4 tablet 11   Ascorbic Acid (VITAMIN C) 100 MG tablet Take 100 mg by mouth daily.     aspirin EC 81 MG tablet Take 81 mg by mouth every morning.      atorvastatin (LIPITOR) 40 MG tablet Take 1 tablet (40 mg total) by mouth every evening. 30 tablet 1   carvedilol (COREG) 6.25 MG tablet TAKE 1 TABLET BY MOUTH TWICE DAILY WITH MEALS. 60 tablet 3   Cholecalciferol (VITAMIN D) 50 MCG (2000 UT) tablet Take 1 tablet (2,000 Units total) by mouth daily. 90 tablet 3   clorazepate (TRANXENE) 7.5 MG tablet Take 1 tablet (7.5 mg total) by mouth daily as needed for anxiety. 30 tablet 2   Cranberry 500 MG TABS Take 1 tablet by mouth daily.     ENTRESTO 24-26 MG Take 1 tablet by mouth 2 (two) times daily.     Fluticasone-Umeclidin-Vilant (TRELEGY ELLIPTA) 100-62.5-25 MCG/ACT AEPB INHALE (1) PUFF INTO THE LUNGS DAILY (Patient taking differently: Inhale 1 puff into the lungs daily.) 60 each 11   isosorbide mononitrate (IMDUR) 30 MG 24 hr tablet Take 0.5 tablets (15 mg total) by mouth daily. 30 tablet 2   leflunomide (ARAVA) 20 MG tablet Take 1 tablet (20 mg total) by mouth daily. 90 tablet 3   meclizine (ANTIVERT) 25 MG tablet Take 1 tablet (25 mg total) by mouth as needed for dizziness. 30 tablet 11   nitroGLYCERIN (NITROSTAT) 0.4 MG SL tablet Place 1 tablet (0.4 mg total) under the tongue every 5 (five) minutes x 3 doses as needed for chest pain. 25 tablet 3   OXYGEN Inhale 2 L into the lungs at bedtime. Can use in the morning as needed     traMADol (ULTRAM) 50 MG tablet Take 50 mg by mouth every 12 (twelve) hours as needed for moderate pain.     nystatin (MYCOSTATIN/NYSTOP) powder Apply 1 Application topically 3 (three) times daily. (Patient not taking: Reported on 01/24/2022) 15 g 0   nystatin cream (MYCOSTATIN) Apply 1 Application topically 2 (two) times daily. (Patient not taking: Reported on 01/24/2022) 60 g 2   No current facility-administered medications for this  visit.    Allergies as of 01/31/2022 - Review Complete 01/31/2022  Allergen Reaction Noted   Plavix [clopidogrel bisulfate] Itching 03/25/2017   Calcium channel blockers Other (See Comments) 01/24/2022   Alprazolam Nausea And Vomiting 05/23/2011   Codeine Nausea And Vomiting 05/23/2011   Percodan [oxycodone-aspirin] Nausea And Vomiting 03/27/2011   Valium Nausea And Vomiting 05/23/2011    Family History  Problem Relation Age of Onset   Heart disease Mother    Aneurysm Father    Lung cancer Brother    Heart disease Sister  Diabetes Brother    Heart disease Brother     Social History   Socioeconomic History   Marital status: Widowed    Spouse name: Not on file   Number of children: Not on file   Years of education: 9th   Highest education level: Not on file  Occupational History    Employer: RETIRED  Tobacco Use   Smoking status: Former    Packs/day: 0.25    Years: 3.00    Total pack years: 0.75    Types: Cigarettes    Start date: 05/14/1977    Quit date: 05/28/2008    Years since quitting: 13.6   Smokeless tobacco: Never   Tobacco comments:    patient states she only smoked for 3 years total  Vaping Use   Vaping Use: Never used  Substance and Sexual Activity   Alcohol use: No    Alcohol/week: 0.0 standard drinks of alcohol   Drug use: No   Sexual activity: Not Currently    Birth control/protection: Surgical    Comment: hyst  Other Topics Concern   Not on file  Social History Narrative   Not on file   Social Determinants of Health   Financial Resource Strain: Low Risk  (01/08/2022)   Overall Financial Resource Strain (CARDIA)    Difficulty of Paying Living Expenses: Not very hard  Food Insecurity: No Food Insecurity (01/25/2022)   Hunger Vital Sign    Worried About Running Out of Food in the Last Year: Never true    Ran Out of Food in the Last Year: Never true  Transportation Needs: No Transportation Needs (01/25/2022)   PRAPARE - Civil engineer, contracting (Medical): No    Lack of Transportation (Non-Medical): No  Physical Activity: Inactive (01/06/2021)   Exercise Vital Sign    Days of Exercise per Week: 0 days    Minutes of Exercise per Session: 0 min  Stress: Stress Concern Present (01/06/2021)   [place]    Feeling of Stress : Very much  Social Connections: Socially Isolated (01/06/2021)   Social Connection and Isolation Panel [NHANES]    Frequency of Communication with Friends and Family: Three times a week    Frequency of Social Gatherings with Friends and Family: Twice a week    Attends Religious Services: Never    Marine scientist or Organizations: No    Attends Archivist Meetings: Never    Marital Status: Widowed  Intimate Partner Violence: Not At Risk (01/25/2022)   Humiliation, Afraid, Rape, and Kick questionnaire    Fear of Current or Ex-Partner: No    Emotionally Abused: No    Physically Abused: No    Sexually Abused: No     Review of Systems   Gen: Denies any fever, chills, fatigue, weight loss, lack of appetite.  CV: Denies chest pain, heart palpitations, peripheral edema, syncope.  Resp: Denies shortness of breath at rest or with exertion. Denies wheezing or cough.  GI: Denies dysphagia or odynophagia. Denies jaundice, hematemesis, fecal incontinence. GU : Denies urinary burning, urinary frequency, urinary hesitancy MS: Denies joint pain, muscle weakness, cramps, or limitation of movement.  Derm: Denies rash, itching, dry skin Psych: Denies depression, anxiety, memory loss, and confusion Heme: Denies bruising, bleeding, and enlarged lymph nodes.   Physical Exam   BP (!) 100/58 (BP Location: Left Arm, Patient Position: Sitting, Cuff Size: Large)   Pulse 76   Temp 98.3  F (36.8 C) (Oral)   Ht '5\' 2"'$  (1.575 m)   Wt 173 lb 4.8 oz (78.6 kg)   BMI 31.70 kg/m  General:   Alert and oriented. Pleasant and  cooperative. Well-nourished and well-developed.  Head:  Normocephalic and atraumatic. Eyes:  Without icterus Ears:  Normal auditory acuity. Lungs:  Clear to auscultation bilaterally.  Heart:  S1, S2 present without murmurs appreciated.  Abdomen:  +BS, soft, non-tender and non-distended. No HSM noted. No guarding or rebound. No masses appreciated.  Rectal:  Deferred  Msk:  Symmetrical without gross deformities. Normal posture. Extremities:  Without edema. Neurologic:  Alert and  oriented x4;  grossly normal neurologically. Skin:  Intact without significant lesions or rashes. Psych:  Alert and cooperative. Normal mood and affect.   Assessment   [name] is an 86 y.o. female presenting today with dysphagia.  She was seen in the ED recently with near syncope and chest discomfort. CT soft tissue neck last week with mild circumerential soft tissue thickening of supraglottic larynx. Recommendations to see ENT. There was also mass effect on upper esophagus r/t anterior osteophyte at C4-5 and C5-7  Appears to have more oropharyngeal issues and no dysphagia if modifying behavior with chewing food well and taking small bites. ENT appt upcoming.  At this point, will pursue BPE first. Keep upcoming cardiology appt.     PLAN   BPE  Keep appt with ENT  Keep appt with Cardiology  Further recommendations to follow   Annitta Needs, PhD, ANP-BC [facility]

## 2022-01-31 NOTE — Telephone Encounter (Signed)
Called pt and she is aware of BPE appt details. She voiced understanding

## 2022-02-02 ENCOUNTER — Encounter: Payer: Self-pay | Admitting: Family Medicine

## 2022-02-02 ENCOUNTER — Other Ambulatory Visit: Payer: Self-pay | Admitting: Cardiovascular Disease

## 2022-02-02 ENCOUNTER — Telehealth: Payer: Self-pay | Admitting: Family Medicine

## 2022-02-02 ENCOUNTER — Ambulatory Visit (INDEPENDENT_AMBULATORY_CARE_PROVIDER_SITE_OTHER): Payer: Medicare Other | Admitting: Family Medicine

## 2022-02-02 VITALS — BP 115/76 | Temp 98.4°F | Ht 62.0 in | Wt 173.0 lb

## 2022-02-02 DIAGNOSIS — N3001 Acute cystitis with hematuria: Secondary | ICD-10-CM

## 2022-02-02 DIAGNOSIS — I10 Essential (primary) hypertension: Secondary | ICD-10-CM | POA: Diagnosis not present

## 2022-02-02 DIAGNOSIS — J449 Chronic obstructive pulmonary disease, unspecified: Secondary | ICD-10-CM

## 2022-02-02 DIAGNOSIS — I5042 Chronic combined systolic (congestive) and diastolic (congestive) heart failure: Secondary | ICD-10-CM | POA: Diagnosis not present

## 2022-02-02 DIAGNOSIS — J384 Edema of larynx: Secondary | ICD-10-CM

## 2022-02-02 DIAGNOSIS — R55 Syncope and collapse: Secondary | ICD-10-CM | POA: Diagnosis not present

## 2022-02-02 DIAGNOSIS — I2510 Atherosclerotic heart disease of native coronary artery without angina pectoris: Secondary | ICD-10-CM | POA: Diagnosis not present

## 2022-02-02 DIAGNOSIS — R300 Dysuria: Secondary | ICD-10-CM | POA: Diagnosis not present

## 2022-02-02 LAB — CBC WITH DIFFERENTIAL/PLATELET
Basophils Absolute: 0 10*3/uL (ref 0.0–0.2)
Basos: 0 %
EOS (ABSOLUTE): 0.3 10*3/uL (ref 0.0–0.4)
Eos: 3 %
Hematocrit: 38.2 % (ref 34.0–46.6)
Hemoglobin: 12.1 g/dL (ref 11.1–15.9)
Immature Grans (Abs): 0 10*3/uL (ref 0.0–0.1)
Immature Granulocytes: 0 %
Lymphocytes Absolute: 2 10*3/uL (ref 0.7–3.1)
Lymphs: 23 %
MCH: 26.7 pg (ref 26.6–33.0)
MCHC: 31.7 g/dL (ref 31.5–35.7)
MCV: 84 fL (ref 79–97)
Monocytes Absolute: 1.1 10*3/uL — ABNORMAL HIGH (ref 0.1–0.9)
Monocytes: 13 %
Neutrophils Absolute: 5.4 10*3/uL (ref 1.4–7.0)
Neutrophils: 61 %
Platelets: 256 10*3/uL (ref 150–450)
RBC: 4.54 x10E6/uL (ref 3.77–5.28)
RDW: 14.8 % (ref 11.7–15.4)
WBC: 8.9 10*3/uL (ref 3.4–10.8)

## 2022-02-02 LAB — MICROSCOPIC EXAMINATION
RBC, Urine: NONE SEEN /HPF (ref 0–2)
Renal Epithel, UA: NONE SEEN /HPF
WBC, UA: >30 /HPF — AB (ref 0–5)

## 2022-02-02 LAB — URINALYSIS, ROUTINE W REFLEX MICROSCOPIC
Bilirubin, UA: NEGATIVE
Glucose, UA: NEGATIVE
Ketones, UA: NEGATIVE
Nitrite, UA: POSITIVE — AB
Protein,UA: NEGATIVE
Specific Gravity, UA: <1.005 — ABNORMAL LOW (ref 1.005–1.030)
Urobilinogen, Ur: 0.2 mg/dL (ref 0.2–1.0)
pH, UA: 5.5 (ref 5.0–7.5)

## 2022-02-02 LAB — CMP14+EGFR
ALT: 15 IU/L (ref 0–32)
AST: 21 IU/L (ref 0–40)
Albumin/Globulin Ratio: 2.6 — ABNORMAL HIGH (ref 1.2–2.2)
Albumin: 4.2 g/dL (ref 3.7–4.7)
Alkaline Phosphatase: 48 IU/L (ref 44–121)
BUN/Creatinine Ratio: 15 (ref 12–28)
BUN: 12 mg/dL (ref 8–27)
Bilirubin Total: 0.6 mg/dL (ref 0.0–1.2)
CO2: 25 mmol/L (ref 20–29)
Calcium: 9.9 mg/dL (ref 8.7–10.3)
Chloride: 102 mmol/L (ref 96–106)
Creatinine, Ser: 0.82 mg/dL (ref 0.57–1.00)
Globulin, Total: 1.6 g/dL (ref 1.5–4.5)
Glucose: 97 mg/dL (ref 70–99)
Potassium: 4.6 mmol/L (ref 3.5–5.2)
Sodium: 142 mmol/L (ref 134–144)
Total Protein: 5.8 g/dL — ABNORMAL LOW (ref 6.0–8.5)
eGFR: 70 mL/min/{1.73_m2} (ref >59)

## 2022-02-02 MED ORDER — SULFAMETHOXAZOLE-TRIMETHOPRIM 800-160 MG PO TABS: 1.0000 | ORAL_TABLET | Freq: Two times a day (BID) | ORAL | 0 refills | Status: DC

## 2022-02-02 NOTE — Progress Notes (Signed)
Subjective:  Patient ID: [name], female    DOB: Mar 30, 1936, 86 y.o.   MRN: [id number]  Patient Care Team: Claretta Fraise, MD as PCP - General (Family Medicine) Josue Hector, MD as PCP - Cardiology (Cardiology) Saporito, Maree Erie, LCSW as Social Worker (Licensed Clinical Social Worker)   Chief Complaint:  Hospitalization Follow-up   HPI: [name] is a 86 y.o. female presenting on 02/02/2022 for Hospitalization Follow-up   //Pt presents today for hospital discharge follow up. [name] is a 86 y.o. female with medical history significant of chronic respiratory failure on 2 L nasal cannula at home, coronary artery disease, CHF, COPD, aortic aneurysm, RA, hyperlipidemia, anxiety, HTN, and GERD. She presented to the ED on  01/24/2022 with complaints of hypoxia and lightheadedness. Oxygen saturation was noted to be in the mid 80s on supplemental oxygen. She was admitted for near syncope, chronic combined heart failure, HTN, COPD, CAD, and supraglottic edema. CTPA was negative, CT soft tissue neck revealed mild circumferential soft tissue thickening of supraglottic larynx. She was treated for the subglottic swelling with steroids during admission and has follow up with ENT scheduled. Echo revealed LVEF 35-45%, global hypokinesis, mild concentric LVH, LBBB, and a moderate pericardial effusion. Follow up with cardiology is scheduled. Symptoms improved during admission and pt was discharged home 01/26/2022. Discharge labs: TC 205, LDL 122, glucose 207, A1C 6, total protein 6.3, WBC 3.4, Hgb 11.6, Hct 35.7. She reports she has been doing well since being home. She does report some urgency and dysuria that started last night. No hematuria, fever, chills, flank pain, confusion, or weakness.     Relevant past medical, surgical, family, and social history reviewed and updated as indicated.  Allergies and medications reviewed and updated. Data reviewed: Chart in Epic.   Past Medical  History:  Diagnosis Date   Acute on chronic respiratory failure with hypoxia (HCC) 04/24/2018   Anxiety    Atypical chest pain    Bronchospasm    CAD (coronary artery disease)    a. s/p DES to mid-LAD and DES to mid-OM1 in 11/2016   Cervical disc disorder with myelopathy, unspecified cervical region    Cervicalgia 01/19/2009   Qualifier: Diagnosis of  By: Aline Brochure MD, Stanley     Chest pain at rest 07/06/3610   Chronic systolic (congestive) heart failure (HCC)    COPD (chronic obstructive pulmonary disease) (HCC)    Coronary artery disease    De Quervain's disease (tenosynovitis) 04/02/2011   Disc disease with myelopathy, cervical    Essential hypertension    Hemorrhoids    Liver mass    Lung, cysts, congenital    Left lung cyst   Myocardial infarction ([place])    Nephrolithiasis    Embedded   Nonischemic cardiomyopathy ([place])    LVEF 35-40% 2015   On home O2    Osteoarthritis    Sprain of wrist 08/07/2012   Thoracic ascending aortic aneurysm (HCC)    4.3 cm April 2016    Past Surgical History:  Procedure Laterality Date   Benign breast tumors     CHOLECYSTECTOMY     COLONOSCOPY     COLONOSCOPY N/A 09/22/2014   Procedure: COLONOSCOPY;  Surgeon: Rogene Houston, MD;  Location: [facility];  Service: Endoscopy;  Laterality: N/A;  830 -- to be done in OR under fluoro   Complete hysterectomy     CORONARY STENT INTERVENTION N/A 11/23/2016   Procedure: Coronary Stent Intervention;  Surgeon: Martinique, Peter M, MD;  Location: [facility];  Service: Cardiovascular;  Laterality: N/A;   LEFT HEART CATH AND CORONARY ANGIOGRAPHY N/A 11/23/2016   Procedure: Left Heart Cath and Coronary Angiography;  Surgeon: Martinique, Peter M, MD;  Location: [place] [facility];  Service: Cardiovascular;  Laterality: N/A;   TONSILLECTOMY AND ADENOIDECTOMY      Social History   Socioeconomic History   Marital status: Widowed    Spouse name: Not on file   Number of children: Not on file   Years of  education: 9th   Highest education level: Not on file  Occupational History    Employer: RETIRED  Tobacco Use   Smoking status: Former    Packs/day: 0.25    Years: 3.00    Total pack years: 0.75    Types: Cigarettes    Start date: 05/14/1977    Quit date: 05/28/2008    Years since quitting: 13.6   Smokeless tobacco: Never   Tobacco comments:    patient states she only smoked for 3 years total  Vaping Use   Vaping Use: Never used  Substance and Sexual Activity   Alcohol use: No    Alcohol/week: 0.0 standard drinks of alcohol   Drug use: No   Sexual activity: Not Currently    Birth control/protection: Surgical    Comment: hyst  Other Topics Concern   Not on file  Social History Narrative   Not on file   Social Determinants of Health   Financial Resource Strain: Low Risk  (01/08/2022)   Overall Financial Resource Strain (CARDIA)    Difficulty of Paying Living Expenses: Not very hard  Food Insecurity: No Food Insecurity (01/25/2022)   Hunger Vital Sign    Worried About Running Out of Food in the Last Year: Never true    Ran Out of Food in the Last Year: Never true  Transportation Needs: No Transportation Needs (01/25/2022)   PRAPARE - Hydrologist (Medical): No    Lack of Transportation (Non-Medical): No  Physical Activity: Inactive (01/06/2021)   Exercise Vital Sign    Days of Exercise per Week: 0 days    Minutes of Exercise per Session: 0 min  Stress: Stress Concern Present (01/06/2021)   [place]    Feeling of Stress : Very much  Social Connections: Socially Isolated (01/06/2021)   Social Connection and Isolation Panel [NHANES]    Frequency of Communication with Friends and Family: Three times a week    Frequency of Social Gatherings with Friends and Family: Twice a week    Attends Religious Services: Never    Marine scientist or Organizations: No    Attends Theatre manager Meetings: Never    Marital Status: Widowed  Intimate Partner Violence: Not At Risk (01/25/2022)   Humiliation, Afraid, Rape, and Kick questionnaire    Fear of Current or Ex-Partner: No    Emotionally Abused: No    Physically Abused: No    Sexually Abused: No    Outpatient Encounter Medications as of 02/02/2022  Medication Sig   albuterol (VENTOLIN HFA) 108 (90 Base) MCG/ACT inhaler INHALE 2 PUFFS INTO THE LUNGS EVERY 4 HOURS AS NEEDED. (Patient taking differently: Inhale 2 puffs into the lungs every 4 (four) hours as needed for wheezing or shortness of breath.)   alendronate (FOSAMAX) 70 MG tablet Take 1 tablet (70 mg total) by mouth every 7 (seven) days.  Take with a full glass of water on an empty stomach. Do not lay down for at least 2 hours   Ascorbic Acid (VITAMIN C) 100 MG tablet Take 100 mg by mouth daily.   aspirin EC 81 MG tablet Take 81 mg by mouth every morning.    atorvastatin (LIPITOR) 40 MG tablet Take 1 tablet (40 mg total) by mouth every evening.   carvedilol (COREG) 6.25 MG tablet TAKE 1 TABLET BY MOUTH TWICE DAILY WITH MEALS.   Cholecalciferol (VITAMIN D) 50 MCG (2000 UT) tablet Take 1 tablet (2,000 Units total) by mouth daily.   clorazepate (TRANXENE) 7.5 MG tablet Take 1 tablet (7.5 mg total) by mouth daily as needed for anxiety.   Cranberry 500 MG TABS Take 1 tablet by mouth daily.   ENTRESTO 24-26 MG Take 1 tablet by mouth 2 (two) times daily.   Fluticasone-Umeclidin-Vilant (TRELEGY ELLIPTA) 100-62.5-25 MCG/ACT AEPB INHALE (1) PUFF INTO THE LUNGS DAILY (Patient taking differently: Inhale 1 puff into the lungs daily.)   isosorbide mononitrate (IMDUR) 30 MG 24 hr tablet Take 0.5 tablets (15 mg total) by mouth daily.   leflunomide (ARAVA) 20 MG tablet Take 1 tablet (20 mg total) by mouth daily.   meclizine (ANTIVERT) 25 MG tablet Take 1 tablet (25 mg total) by mouth as needed for dizziness.   nitroGLYCERIN (NITROSTAT) 0.4 MG SL tablet Place 1 tablet (0.4 mg  total) under the tongue every 5 (five) minutes x 3 doses as needed for chest pain.   OXYGEN Inhale 2 L into the lungs at bedtime. Can use in the morning as needed   sulfamethoxazole-trimethoprim (BACTRIM DS) 800-160 MG tablet Take 1 tablet by mouth 2 (two) times daily.   traMADol (ULTRAM) 50 MG tablet Take 50 mg by mouth every 12 (twelve) hours as needed for moderate pain.   No facility-administered encounter medications on file as of 02/02/2022.    Allergies  Allergen Reactions   Plavix [Clopidogrel Bisulfate] Itching    Severe itching   Calcium Channel Blockers Other (See Comments)    cramping   Alprazolam Nausea And Vomiting   Codeine Nausea And Vomiting   Percodan [Oxycodone-Aspirin] Nausea And Vomiting   Valium Nausea And Vomiting    Review of Systems  Constitutional:  Negative for activity change, appetite change, chills, diaphoresis, fatigue, fever and unexpected weight change.  HENT: Negative.  Negative for sore throat, trouble swallowing and voice change.   Eyes: Negative.  Negative for photophobia and visual disturbance.  Respiratory:  Negative for apnea, cough, choking, chest tightness, shortness of breath and stridor.   Cardiovascular:  Positive for leg swelling (minimal). Negative for chest pain and palpitations.  Gastrointestinal:  Negative for abdominal pain, blood in stool, constipation, diarrhea, nausea and vomiting.  Endocrine: Negative.  Negative for polydipsia, polyphagia and polyuria.  Genitourinary:  Positive for dysuria, frequency and urgency. Negative for decreased urine volume, difficulty urinating, dyspareunia, enuresis, flank pain, genital sores, hematuria, menstrual problem, pelvic pain, vaginal bleeding, vaginal discharge and vaginal pain.  Musculoskeletal:  Positive for arthralgias, gait problem and joint swelling. Negative for back pain and myalgias.  Skin: Negative.   Allergic/Immunologic: Negative.   Neurological:  Negative for dizziness, tremors,  seizures, syncope, facial asymmetry, speech difficulty, weakness, light-headedness, numbness and headaches.  Hematological: Negative.   Psychiatric/Behavioral:  Negative for confusion, hallucinations, sleep disturbance and suicidal ideas.   All other systems reviewed and are negative.       Objective:  BP 115/76   Temp 98.4 F (36.9  C)   Ht _0  (1.575 m)   Wt 173 lb (78.5 kg)   SpO2 93%   BMI 31.64 kg/m    Wt Readings from Last 3 Encounters:  02/02/22 173 lb (78.5 kg)  01/31/22 173 lb 4.8 oz (78.6 kg)  01/26/22 179 lb 14.3 oz (81.6 kg)    Physical Exam Vitals and nursing note reviewed.  Constitutional:      General: She is not in acute distress.    Appearance: Normal appearance. She is obese. She is not ill-appearing, toxic-appearing or diaphoretic.  HENT:     Head: Normocephalic and atraumatic.     Right Ear: Tympanic membrane, ear canal and external ear normal. There is no impacted cerumen.     Left Ear: Tympanic membrane, ear canal and external ear normal. There is no impacted cerumen.     Nose: Nose normal. No congestion or rhinorrhea.     Mouth/Throat:     Lips: Pink.     Mouth: Mucous membranes are moist.     Tongue: No lesions. Tongue does not deviate from midline.     Palate: No mass and lesions.     Pharynx: Oropharynx is clear. No pharyngeal swelling, oropharyngeal exudate, posterior oropharyngeal erythema or uvula swelling.  Eyes:     Conjunctiva/sclera: Conjunctivae normal.     Pupils: Pupils are equal, round, and reactive to light.  Neck:     Thyroid: No thyroid mass, thyromegaly or thyroid tenderness.     Trachea: Trachea and phonation normal.  Cardiovascular:     Rate and Rhythm: Normal rate and regular rhythm.     Heart sounds: Normal heart sounds.  Pulmonary:     Effort: Pulmonary effort is normal.     Breath sounds: Normal breath sounds.  Abdominal:     General: There is no distension.  Musculoskeletal:     Cervical back: Normal range of  motion and neck supple.     Right lower leg: No edema.     Left lower leg: No edema.     Comments: Heberden's nodes present  Skin:    General: Skin is warm and dry.     Capillary Refill: Capillary refill takes less than 2 seconds.  Neurological:     General: No focal deficit present.     Mental Status: She is alert and oriented to person, place, and time.     Gait: Gait abnormal (using cane).  Psychiatric:        Mood and Affect: Mood normal.        Behavior: Behavior normal.        Thought Content: Thought content normal.        Judgment: Judgment normal.     Results for orders placed or performed in visit on 02/02/22  Microscopic Examination   Urine  Result Value Ref Range   WBC, UA >30 (A) 0 - 5 /hpf   RBC, Urine None seen 0 - 2 /hpf   Epithelial Cells (non renal) 0-10 0 - 10 /hpf   Renal Epithel, UA None seen None seen /hpf   Bacteria, UA Moderate (A) None seen/Few  Urinalysis, Routine w reflex microscopic  Result Value Ref Range   Specific Gravity, UA <1.005 (L) 1.005 - 1.030   pH, UA 5.5 5.0 - 7.5   Color, UA Yellow Yellow   Appearance Ur Cloudy (A) Clear   Leukocytes,UA 3+ (A) Negative   Protein,UA Negative Negative/Trace   Glucose, UA Negative Negative   Ketones, UA Negative  Negative   RBC, UA Trace (A) Negative   Bilirubin, UA Negative Negative   Urobilinogen, Ur 0.2 0.2 - 1.0 mg/dL   Nitrite, UA Positive (A) Negative   Microscopic Examination See below:        Pertinent labs & imaging results that were available during my care of the patient were reviewed by me and considered in my medical decision making.  Assessment & Plan:  [name] was seen today for hospitalization follow-up.  Diagnoses and all orders for this visit:  Near syncope Doing well since discharge home. Will repeat labs today. Keep follow up with cardiology as scheduled.  -     CBC with Differential/Platelet -     CMP14+EGFR  Supraglottic edema Keep follow up with ENT as scheduled.    Chronic combined systolic and diastolic CHF (congestive heart failure) ([place]) Appears well compensated in office. Keep follow up with cardiology as scheduled.  -     CBC with Differential/Platelet -     CMP14+EGFR  Coronary artery disease involving native coronary artery of native heart without angina pectoris Follow up with cardiology as scheduled.  -     CBC with Differential/Platelet -     CMP14+EGFR  COPD mixed type ([place]) Doing well. Has been wearing oxygen since discharge and notes oxygen saturations are staying above 90%. -     CBC with Differential/Platelet -     CMP14+EGFR  Essential hypertension Well controlled.  -     CBC with Differential/Platelet -     CMP14+EGFR  Dysuria Acute cystitis with hematuria Urinalysis in office positive for leukocytes, nitrites, and blood. Previous culture reviewed and antibiotic selection based on results. Culture pending. Will change if warranted. No red flags present concerning for acute pyelonephritis.  -     Urinalysis, Routine w reflex microscopic -     Urine Culture -     sulfamethoxazole-trimethoprim (BACTRIM DS) 800-160 MG tablet; Take 1 tablet by mouth 2 (two) times daily. -     Microscopic Examination     Continue all other maintenance medications.  Follow up plan: Return if symptoms worsen or fail to improve.   Continue healthy lifestyle choices, including diet (rich in fruits, vegetables, and lean proteins, and low in salt and simple carbohydrates) and exercise (at least 30 minutes of moderate physical activity daily).   The above assessment and management plan was discussed with the patient. The patient verbalized understanding of and has agreed to the management plan. Patient is aware to call the clinic if they develop any new symptoms or if symptoms persist or worsen. Patient is aware when to return to the clinic for a follow-up visit. Patient educated on when it is appropriate to go to the emergency department.    Monia Pouch, FNP-C [facility] [phone]

## 2022-02-06 ENCOUNTER — Emergency Department (HOSPITAL_COMMUNITY): Payer: Medicare Other

## 2022-02-06 ENCOUNTER — Emergency Department (HOSPITAL_COMMUNITY)
Admission: EM | Admit: 2022-02-06 | Discharge: 2022-02-06 | Disposition: A | Discharge status: Home / Self Care | Payer: Medicare Other | Attending: Emergency Medicine | Admitting: Emergency Medicine

## 2022-02-06 DIAGNOSIS — Z7982 Long term (current) use of aspirin: Secondary | ICD-10-CM | POA: Diagnosis not present

## 2022-02-06 DIAGNOSIS — I447 Left bundle-branch block, unspecified: Secondary | ICD-10-CM | POA: Diagnosis not present

## 2022-02-06 DIAGNOSIS — I509 Heart failure, unspecified: Secondary | ICD-10-CM | POA: Insufficient documentation

## 2022-02-06 DIAGNOSIS — I454 Nonspecific intraventricular block: Secondary | ICD-10-CM | POA: Diagnosis not present

## 2022-02-06 DIAGNOSIS — R079 Chest pain, unspecified: Secondary | ICD-10-CM

## 2022-02-06 DIAGNOSIS — R7989 Other specified abnormal findings of blood chemistry: Secondary | ICD-10-CM | POA: Diagnosis not present

## 2022-02-06 DIAGNOSIS — R0789 Other chest pain: Secondary | ICD-10-CM | POA: Diagnosis not present

## 2022-02-06 DIAGNOSIS — J449 Chronic obstructive pulmonary disease, unspecified: Secondary | ICD-10-CM | POA: Diagnosis not present

## 2022-02-06 DIAGNOSIS — Z7951 Long term (current) use of inhaled steroids: Secondary | ICD-10-CM | POA: Insufficient documentation

## 2022-02-06 DIAGNOSIS — I2510 Atherosclerotic heart disease of native coronary artery without angina pectoris: Secondary | ICD-10-CM | POA: Insufficient documentation

## 2022-02-06 DIAGNOSIS — I959 Hypotension, unspecified: Secondary | ICD-10-CM | POA: Diagnosis not present

## 2022-02-06 DIAGNOSIS — R52 Pain, unspecified: Secondary | ICD-10-CM | POA: Diagnosis not present

## 2022-02-06 LAB — TROPONIN I (HIGH SENSITIVITY)
Troponin I (High Sensitivity): 10 ng/L (ref <18)
Troponin I (High Sensitivity): 11 ng/L (ref <18)

## 2022-02-06 LAB — CBC WITH DIFFERENTIAL/PLATELET
Abs Immature Granulocytes: 0.02 10*3/uL (ref 0.00–0.07)
Basophils Absolute: 0.1 10*3/uL (ref 0.0–0.1)
Basophils Relative: 1 %
Eosinophils Absolute: 0.3 10*3/uL (ref 0.0–0.5)
Eosinophils Relative: 5 %
HCT: 36.9 % (ref 36.0–46.0)
Hemoglobin: 11.9 g/dL — ABNORMAL LOW (ref 12.0–15.0)
Immature Granulocytes: 0 %
Lymphocytes Relative: 30 %
Lymphs Abs: 2.2 10*3/uL (ref 0.7–4.0)
MCH: 27.4 pg (ref 26.0–34.0)
MCHC: 32.2 g/dL (ref 30.0–36.0)
MCV: 85 fL (ref 80.0–100.0)
Monocytes Absolute: 0.9 10*3/uL (ref 0.1–1.0)
Monocytes Relative: 12 %
Neutro Abs: 4 10*3/uL (ref 1.7–7.7)
Neutrophils Relative %: 52 %
Platelets: 242 10*3/uL (ref 150–400)
RBC: 4.34 MIL/uL (ref 3.87–5.11)
RDW: 15.9 % — ABNORMAL HIGH (ref 11.5–15.5)
WBC: 7.5 10*3/uL (ref 4.0–10.5)
nRBC: 0 % (ref 0.0–0.2)

## 2022-02-06 LAB — BASIC METABOLIC PANEL
Anion gap: 9 (ref 5–15)
BUN: 14 mg/dL (ref 8–23)
CO2: 25 mmol/L (ref 22–32)
Calcium: 9.4 mg/dL (ref 8.9–10.3)
Chloride: 106 mmol/L (ref 98–111)
Creatinine, Ser: 1.01 mg/dL — ABNORMAL HIGH (ref 0.44–1.00)
GFR, Estimated: 55 mL/min — ABNORMAL LOW (ref >60)
Glucose, Bld: 108 mg/dL — ABNORMAL HIGH (ref 70–99)
Potassium: 4.3 mmol/L (ref 3.5–5.1)
Sodium: 140 mmol/L (ref 135–145)

## 2022-02-06 LAB — URINE CULTURE

## 2022-02-06 LAB — BRAIN NATRIURETIC PEPTIDE: B Natriuretic Peptide: 251 pg/mL — ABNORMAL HIGH (ref 0.0–100.0)

## 2022-02-06 MED ORDER — NITROGLYCERIN 0.4 MG SL SUBL: 0.4000 mg | SUBLINGUAL_TABLET | SUBLINGUAL | Status: DC | PRN

## 2022-02-06 NOTE — ED Provider Notes (Signed)
[facility] Provider Note   CSN: [id number] Arrival date & time: 02/06/22  1617     History {Add pertinent medical, surgical, social history, OB history to HPI:1} Chief Complaint  Patient presents with   Chest Pain    [name] is a 86 y.o. female.  She has a history of COPD on oxygen, CAD CHF and was recently admitted about a month ago for chest discomfort and or syncope.  She was identified to have a moderate pericardial effusion and some subglottic edema.  Today at home she began experiencing some substernal chest pressure about an hour prior to arrival.  She took 4 baby aspirin without improvement and called 911.  She states her symptoms are improving now.  It was associated with some shortness of breath and feeling lightheaded.  She said she has had these symptoms before.  She denies any nausea or vomiting.  The history is provided by the patient and the EMS personnel.  Chest Pain Pain location:  Substernal area Pain quality: pressure   Pain severity:  Moderate Onset quality:  Sudden Duration:  1 hour Timing:  Constant Progression:  Improving Chronicity:  Recurrent Context: at rest   Relieved by:  Nothing Worsened by:  Nothing Ineffective treatments:  Aspirin Associated symptoms: dizziness and shortness of breath   Associated symptoms: no abdominal pain, no cough, no diaphoresis, no fever, no nausea and no vomiting   Risk factors: coronary artery disease and hypertension        Home Medications Prior to Admission medications   Medication Sig Start Date End Date Taking? Authorizing Provider  albuterol (VENTOLIN HFA) 108 (90 Base) MCG/ACT inhaler INHALE 2 PUFFS INTO THE LUNGS EVERY 4 HOURS AS NEEDED. Patient taking differently: Inhale 2 puffs into the lungs every 4 (four) hours as needed for wheezing or shortness of breath. 11/15/21   Chesley Mires, MD  alendronate (FOSAMAX) 70 MG tablet Take 1 tablet (70 mg total) by mouth every 7 (seven) days.  Take with a full glass of water on an empty stomach. Do not lay down for at least 2 hours 09/26/21   Claretta Fraise, MD  Ascorbic Acid (VITAMIN C) 100 MG tablet Take 100 mg by mouth daily.    [provider]  aspirin EC 81 MG tablet Take 81 mg by mouth every morning.     [provider]  atorvastatin (LIPITOR) 40 MG tablet Take 1 tablet (40 mg total) by mouth every evening. 01/26/22   Johnson, Clanford L, MD  carvedilol (COREG) 6.25 MG tablet TAKE 1 TABLET BY MOUTH TWICE DAILY WITH MEALS. 12/05/21   Josue Hector, MD  Cholecalciferol (VITAMIN D) 50 MCG (2000 UT) tablet Take 1 tablet (2,000 Units total) by mouth daily. 09/27/21   Claretta Fraise, MD  clorazepate (TRANXENE) 7.5 MG tablet Take 1 tablet (7.5 mg total) by mouth daily as needed for anxiety. 09/26/21   Claretta Fraise, MD  Cranberry 500 MG TABS Take 1 tablet by mouth daily.    [provider]  ENTRESTO 24-26 MG Take 1 tablet by mouth 2 (two) times daily. 10/02/21   [provider]  Fluticasone-Umeclidin-Vilant (TRELEGY ELLIPTA) 100-62.5-25 MCG/ACT AEPB INHALE (1) PUFF INTO THE LUNGS DAILY Patient taking differently: Inhale 1 puff into the lungs daily. 11/15/21   Chesley Mires, MD  isosorbide mononitrate (IMDUR) 30 MG 24 hr tablet TAKE (1/2) TABLET BY MOUTH DAILY. 02/02/22   Josue Hector, MD  leflunomide (ARAVA) 20 MG tablet Take 1 tablet (  20 mg total) by mouth daily. 12/28/21   Claretta Fraise, MD  meclizine (ANTIVERT) 25 MG tablet Take 1 tablet (25 mg total) by mouth as needed for dizziness. 04/19/21   Josue Hector, MD  nitroGLYCERIN (NITROSTAT) 0.4 MG SL tablet Place 1 tablet (0.4 mg total) under the tongue every 5 (five) minutes x 3 doses as needed for chest pain. 07/21/20   [name], NP  OXYGEN Inhale 2 L into the lungs at bedtime. Can use in the morning as needed    [provider]  sulfamethoxazole-trimethoprim (BACTRIM DS) 800-160 MG tablet Take 1 tablet by mouth 2 (two) times daily. 02/02/22    Baruch Gouty, FNP  traMADol (ULTRAM) 50 MG tablet Take 50 mg by mouth every 12 (twelve) hours as needed for moderate pain.    [provider]      Allergies    Plavix [clopidogrel bisulfate], Calcium channel blockers, Alprazolam, Codeine, Percodan [oxycodone-aspirin], and Valium    Review of Systems   Review of Systems  Constitutional:  Negative for diaphoresis and fever.  Respiratory:  Positive for shortness of breath. Negative for cough.   Cardiovascular:  Positive for chest pain.  Gastrointestinal:  Negative for abdominal pain, nausea and vomiting.  Neurological:  Positive for dizziness.    Physical Exam Updated Vital Signs Pulse 62   Temp 98.2 F (36.8 C) (Oral)   Resp 20   Ht '5\' 2"'$  (1.575 m)   Wt 78.5 kg   SpO2 97%   BMI 31.64 kg/m  Physical Exam Vitals and nursing note reviewed.  Constitutional:      General: She is not in acute distress.    Appearance: She is well-developed.  HENT:     Head: Normocephalic and atraumatic.  Eyes:     Conjunctiva/sclera: Conjunctivae normal.  Cardiovascular:     Rate and Rhythm: Normal rate and regular rhythm.     Heart sounds: Normal heart sounds. No murmur heard. Pulmonary:     Effort: Pulmonary effort is normal. No respiratory distress.     Breath sounds: Normal breath sounds.  Abdominal:     Palpations: Abdomen is soft.     Tenderness: There is no abdominal tenderness. There is no guarding or rebound.  Musculoskeletal:        General: No swelling. Normal range of motion.     Cervical back: Neck supple.     Right lower leg: No tenderness. No edema.     Left lower leg: No tenderness. No edema.  Skin:    General: Skin is warm and dry.     Capillary Refill: Capillary refill takes less than 2 seconds.  Neurological:     General: No focal deficit present.     Mental Status: She is alert.     ED Results / Procedures / Treatments   Labs (all labs ordered are listed, but only abnormal results are  displayed) Labs Reviewed - No data to display  EKG None  Radiology No results found.  Procedures Procedures  {Document cardiac monitor, telemetry assessment procedure when appropriate:1}  Medications Ordered in ED Medications  nitroGLYCERIN (NITROSTAT) SL tablet 0.4 mg (has no administration in time range)    ED Course/ Medical Decision Making/ A&P                           Medical Decision Making Amount and/or Complexity of Data Reviewed Labs: ordered. Radiology: ordered.  Risk Prescription drug management.  This patient complains of ***; this involves an extensive number of treatment Options and is a complaint that carries with it a high risk of complications and morbidity. The differential includes ***  I ordered, reviewed and interpreted labs, which included *** I ordered medication *** and reviewed PMP when indicated. I ordered imaging studies which included *** and I independently    visualized and interpreted imaging which showed *** Additional history obtained from *** Previous records obtained and reviewed *** I consulted *** and discussed lab and imaging findings and discussed disposition.  Cardiac monitoring reviewed, *** Social determinants considered, *** Critical Interventions: ***  After the interventions stated above, I reevaluated the patient and found *** Admission and further testing considered, ***   {Document critical care time when appropriate:1} {Document review of labs and clinical decision tools ie heart score, Chads2Vasc2 etc:1}  {Document your independent review of radiology images, and any outside records:1} {Document your discussion with family members, caretakers, and with consultants:1} {Document social determinants of health affecting pt's care:1} {Document your decision making why or why not admission, treatments were needed:1} Final Clinical Impression(s) / ED Diagnoses Final diagnoses:  None    Rx / DC Orders ED  Discharge Orders     None

## 2022-02-06 NOTE — ED Triage Notes (Signed)
Pt arrived via [facility] from home c/o chest pain 1 hour before calling EMS, states pain feels like pressure chewed 4 baby aspirin before EMS arrival. HX of previous MI. Pt also states pressure has eased since taking aspirin

## 2022-02-06 NOTE — Discharge Instructions (Signed)
You were seen in the emergency department for chest pain.  You had blood work EKG and chest x-ray that did not show any evidence of heart injury.  Please continue your regular medications and follow-up with your doctors.  Return to the emergency department if any worsening or concerning symptoms.

## 2022-02-07 ENCOUNTER — Ambulatory Visit: Payer: Self-pay | Admitting: *Deleted

## 2022-02-07 ENCOUNTER — Encounter: Payer: Self-pay | Admitting: *Deleted

## 2022-02-07 NOTE — Patient Outreach (Signed)
  Care Coordination   Follow Up Visit Note   02/07/2022  Name: [name] MRN: [id number] DOB: Mar 17, 1936  [name] is a 86 y.o. year old female who sees Stacks, Cletus Gash, MD for primary care. I spoke with [name] by phone today.  What matters to the patients health and wellness today?  Assist with Obtaining [place] through [employer].    Goals Addressed               This Visit's Progress     Assist with Obtaining Personal Care Services through [employer]. (pt-stated)   On track     Care Coordination Interventions:  Solution-Focused Strategies Employed. Active Listening/Reflection Utilized.  Verbalization of Feelings Encouraged.  Problem Solving/Task Centered Solutions Developed.   CSW Collaboration with Primary Care Provider, Dr. Claretta Fraise, and Nurse, to Request Review and Signature of Completed Personal Care Services Application. ~ Completed Personal Care Services Application Faxed to Dr. Claretta Fraise, and Nurse, on 02/07/2022, for Review and Signature. ~ [place] with Primary Care Provider, Dr. Claretta Fraise, and Nurse, on 02/07/2022, to Request Completed and [place] Application Be Faxed to [employer] [phone]) for Processing.          SDOH assessments and interventions completed:  Yes.   Care Coordination Interventions Activated:  Yes.   Care Coordination Interventions:  Yes, provided.   Follow up plan: Follow up call scheduled for 02/14/2022 at 10:30 am.  Encounter Outcome:  Pt. Visit Completed.   [name], BSW, MSW, LCSW  Licensed Education officer, environmental Health System  Mailing [place] [address]. [address] [place], [place] [address] Physical Address-[address], [place], [place] [address] Toll Free Main # [phone] Fax # [phone] Cell #  [phone] Di [email or web address]

## 2022-02-07 NOTE — Telephone Encounter (Signed)
PCS form typed up and placed on PCPs desk, who is out of office till 02/12/22

## 2022-02-07 NOTE — Patient Instructions (Signed)
Visit Information  Thank you for taking time to visit with me today. Please don't hesitate to contact me if I can be of assistance to you.   Following are the goals we discussed today:   Goals Addressed               This Visit's Progress     Assist with Obtaining Personal Care Services through [employer]. (pt-stated)   On track     Care Coordination Interventions:  Solution-Focused Strategies Employed. Active Listening/Reflection Utilized.  Verbalization of Feelings Encouraged.  Problem Solving/Task Centered Solutions Developed.   CSW Collaboration with Primary Care Provider, Dr. Claretta Fraise, and Nurse, to Request Review and Signature of Completed Personal Care Services Application. ~ Completed Personal Care Services Application Faxed to Dr. Claretta Fraise, and Nurse, on 02/07/2022, for Review and Signature. ~ [place] with Primary Care Provider, Dr. Claretta Fraise, and Nurse, on 02/07/2022, to Request Completed and [place] Application Be Faxed to [employer] [phone]) for Processing.          Our next appointment is by telephone on 02/14/2022 at 10:30 am.  Please call the care guide team at [phone] if you need to cancel or reschedule your appointment.   If you are experiencing a Mental Health or [place] or need someone to talk to, please call the Suicide and Crisis Lifeline: 988 call the [place] National Suicide Prevention Lifeline: (304)798-9786 or TTY: 269-477-8996 TTY 289-858-3409) to talk to a trained counselor call 1-800-273-TALK (toll free, 24 hour hotline) go to [facility] [address] [place] [phone]) call the [place]: ([phone] call 911  Patient verbalizes understanding of instructions and care plan provided today and agrees to view in [place]. Active MyChart status and patient understanding of how to access  instructions and care plan via MyChart confirmed with patient.     Telephone follow up appointment with care management team member scheduled for:  02/14/2022 at 10:30 am.  Nat Christen, BSW, MSW, [place]  Licensed Clinical Social Worker  [place]  Mailing [place]. [address] [place], [place] [address] Physical Address-[address], [place], [place] [address] Toll Free Main # [phone] Fax # [phone] Cell # [phone] Di [email or web address]

## 2022-02-08 ENCOUNTER — Ambulatory Visit (HOSPITAL_COMMUNITY)
Admission: RE | Admit: 2022-02-08 | Discharge: 2022-02-08 | Disposition: A | Discharge status: Home / Self Care | Payer: Medicare Other | Source: Ambulatory Visit | Attending: Gastroenterology | Admitting: Gastroenterology

## 2022-02-08 DIAGNOSIS — R1312 Dysphagia, oropharyngeal phase: Secondary | ICD-10-CM | POA: Diagnosis not present

## 2022-02-14 ENCOUNTER — Encounter: Payer: Self-pay | Admitting: *Deleted

## 2022-02-14 ENCOUNTER — Ambulatory Visit: Payer: Self-pay | Admitting: *Deleted

## 2022-02-14 NOTE — Patient Outreach (Signed)
  Care Coordination   Follow Up Visit Note   02/14/2022  Name: DEZI BRAUNER MRN: [id number] DOB: 1936-02-21  Dashia Caldeira Cocco is a 86 y.o. year old female who sees Stacks, Cletus Gash, MD for primary care. I spoke with [name] Knack by phone today.  What matters to the patients health and wellness today?  Assist with Obtaining [place].    Goals Addressed               This Visit's Progress     Assist with Obtaining Personal Care Services.  (pt-stated)   On track     Care Coordination Interventions:  Solution-Focused Strategies Employed. Active Listening/Reflection Utilized.  Verbalization of Feelings Encouraged.  CSW Collaboration with Primary Care Provider, Dr. Luane School Nurse, to Request Completed and [place] Application Be Faxed to [employer] [phone]) for Processing.  ~ Fax Confirmation Received on 02/14/2022. Please Accept All Calls from Representative with [employer], in An Effort to Complete Phone Assessment to [place] in the Home.        SDOH assessments and interventions completed:  Yes.  Care Coordination Interventions Activated:  Yes.   Care Coordination Interventions:  Yes, provided.   Follow up plan: Follow up call scheduled for 02/23/2022 at 10:00 am.  Encounter Outcome:  Pt. Visit Completed.   Nat Christen, BSW, MSW, LCSW  Licensed Education officer, environmental Health System  Mailing [place] [address]. [address], [place], [place] [address] Physical Address-[address], [place], [place] [address] Toll Free Main # [phone] Fax # [phone] Cell # [phone] Di [email or web address]

## 2022-02-14 NOTE — Patient Instructions (Signed)
Visit Information  Thank you for taking time to visit with me today. Please don't hesitate to contact me if I can be of assistance to you.   Following are the goals we discussed today:   Goals Addressed               This Visit's Progress     Assist with Obtaining Personal Care Services. (pt-stated)   On track     Care Coordination Interventions:  Solution-Focused Strategies Employed. Active Listening/Reflection Utilized.  Verbalization of Feelings Encouraged.  CSW Collaboration with Primary Care Provider, Dr. Luane School Nurse, to Request Completed and [place] Application Be Faxed to [employer] [phone]) for Processing.  ~ Fax Confirmation Received on 02/14/2022. Please Accept All Calls from Representative with [employer], in An Effort to Complete Phone Assessment to [place] in the Home.          Our next appointment is by telephone on 02/23/2022 at 10:00 am.  Please call the care guide team at [phone] if you need to cancel or reschedule your appointment.   If you are experiencing a Mental Health or [place] or need someone to talk to, please call the Suicide and Crisis Lifeline: 988 call the [place] National Suicide Prevention Lifeline: (678)147-1219 or TTY: (703)172-3962 TTY 616-422-1562) to talk to a trained counselor call 1-800-273-TALK (toll free, 24 hour hotline) go to [facility] [address] [place] [phone]) call the [place]: [phone] call 911  Patient verbalizes understanding of instructions and care plan provided today and agrees to view in [place]. Active MyChart status and patient understanding of how to access instructions and care plan via MyChart confirmed with patient.     Telephone follow up appointment with care management team member scheduled for:  02/23/2022 at 10:00 am.  Nat Christen, BSW,  MSW, [place]  Licensed Clinical Social Worker  [place]  Mailing [place]. [address] [place], [place] [address] Physical Address-[address], [place], [place] [address] Toll Free Main # [phone] Fax # [phone] Cell # [phone] Di Kindle.[email or web address]

## 2022-02-15 NOTE — Telephone Encounter (Signed)
Have attempted to fax completed form the last 2 days to [facility]. Will continue to try.

## 2022-02-21 NOTE — Telephone Encounter (Signed)
Have still attempted to fax form to both fax #s on the PCS form to [employer]. Called their ph# today and found out they are no longer handling PCS for MCD. [facility] ([employer]) handles these now, sent form to them at fax# [phone] Talked w/ pt she made me aware that the [place] had recently done this and it has been set up for them to come out and evaluate her for PCS.

## 2022-02-23 ENCOUNTER — Ambulatory Visit: Payer: Self-pay | Admitting: *Deleted

## 2022-02-23 ENCOUNTER — Encounter: Payer: Self-pay | Admitting: *Deleted

## 2022-02-23 NOTE — Patient Instructions (Signed)
Visit Information  Thank you for taking time to visit with me today. Please don't hesitate to contact me if I can be of assistance to you.   Following are the goals we discussed today:   Goals Addressed               This Visit's Progress     Assist with Obtaining Personal Care Services. (pt-stated)   On track     Care Coordination Interventions:  Solution-Focused Strategies Employed. Active Listening/Reflection Utilized.  Verbalization of Feelings Encouraged.  Emotional Support Provided. Caregiver Stress Acknowledged. Home Assessment to [place] in the Home, through [facility], Scheduled on 03/05/2022 at 10:30 am.          Our next appointment is by telephone on 03/06/2022 at 2:00 pm.  Please call the care guide team at [phone] if you need to cancel or reschedule your appointment.   If you are experiencing a Mental Health or [place] or need someone to talk to, please call the Suicide and Crisis Lifeline: 988 call the [place] National Suicide Prevention Lifeline: 458-244-4541 or TTY: (843) 105-5804 TTY (585)458-2816) to talk to a trained counselor call 1-800-273-TALK (toll free, 24 hour hotline) go to [facility] [address] [place] [phone]) call the [place]: [phone] call 911  Patient verbalizes understanding of instructions and care plan provided today and agrees to view in [place]. Active MyChart status and patient understanding of how to access instructions and care plan via MyChart confirmed with patient.     Telephone follow up appointment with care management team member scheduled for:  03/06/2022 at 2:00 pm.  Nat Christen, BSW, MSW, [place]  Licensed Clinical Social Worker  [place]  Mailing [place]. [address] [place], [place] [address] Physical Address-[address],  [place],  [address] Toll Free Main # [phone] Fax # [phone] Cell # [phone] Di [email or web address]

## 2022-02-23 NOTE — Patient Outreach (Signed)
  Care Coordination   Follow Up Visit Note   02/23/2022  Name: SHERILEE SMOTHERMAN MRN: [id number] DOB: [birth year]  Charese Abundis Ucci is a 86 y.o. year old female who sees Stacks, Cletus Gash, MD for primary care. I spoke with [name] Po[place] by phone today.  What matters to the patients health and wellness today?   Assist with Obtaining [place].   Goals Addressed               This Visit's Progress     Assist with Obtaining Personal Care Services. (pt-stated)   On track     Care Coordination Interventions:  Solution-Focused Strategies Employed. Active Listening/Reflection Utilized.  Verbalization of Feelings Encouraged.  Emotional Support Provided. Caregiver Stress Acknowledged. Home Assessment to [place] in the Home, through [facility], Scheduled on 03/05/2022 at 10:30 am.          SDOH assessments and interventions completed:  Yes.  Care Coordination Interventions Activated:  Yes.   Care Coordination Interventions:  Yes, provided.   Follow up plan: Follow up call scheduled for 03/06/2022 at 2:00 pm.  Encounter Outcome:  Pt. Visit Completed.   Nat Christen, BSW, MSW, LCSW  Licensed Education officer, environmental Health System  Mailing [place] [address]. [address], [place], [place] [address] Physical Address-[address], [place], [place] [address] Toll Free Main # [phone] Fax # [phone] Cell # [phone] Di [email or web address]

## 2022-03-01 ENCOUNTER — Other Ambulatory Visit: Payer: Self-pay | Admitting: Cardiovascular Disease

## 2022-03-01 ENCOUNTER — Other Ambulatory Visit: Payer: Self-pay

## 2022-03-01 ENCOUNTER — Other Ambulatory Visit: Payer: Self-pay | Admitting: Family Medicine

## 2022-03-01 MED ORDER — ENTRESTO 24-26 MG PO TABS: 1.0000 | ORAL_TABLET | Freq: Two times a day (BID) | ORAL | 5 refills | Status: DC

## 2022-03-06 ENCOUNTER — Ambulatory Visit: Payer: Self-pay | Admitting: *Deleted

## 2022-03-06 ENCOUNTER — Encounter: Payer: Self-pay | Admitting: *Deleted

## 2022-03-06 NOTE — Patient Outreach (Deleted)
  Care Coordination   Follow Up Visit Note   03/06/2022  Name: [name] MRN: [id number] DOB: 06-06-1935  [name] is a 86 y.o. year old female who sees Stacks, Cletus Gash, MD for primary care. I spoke with [name] by phone today.  What matters to the patients health and wellness today?  Assist with Obtaining [place].    Goals Addressed               This Visit's Progress     Assist with Obtaining Personal Care Services. (pt-stated)   On track     Care Coordination Interventions:  Solution-Focused Strategies Employed. Active Listening/Reflection Utilized.  Verbalization of Feelings Encouraged.  Emotional Support Provided. Caregiver Stress Acknowledged. Home Assessment to [place] in the Home, through [facility], Scheduled on 03/05/2022 at 10:30 am.        SDOH assessments and interventions completed:  Yes.   Care Coordination Interventions Activated:  Yes.    Care Coordination Interventions:  Yes, provided.    Follow up plan: Follow up call scheduled for 03/20/2022 at 12:45 pm.   Encounter Outcome:  Pt. Visit Completed.    Nat Christen, BSW, MSW, LCSW  Licensed Education officer, environmental Health System  Mailing [place] [address]. [address] [place], [place] [address] Physical Address-[address], [place], [place] [address] Toll Free Main # [phone] Fax # [phone] Cell # [phone] Di [email or web address]

## 2022-03-06 NOTE — Patient Outreach (Signed)
  Care Coordination   Follow Up Visit Note   03/06/2022  Name: BIANNCA SCANTLIN MRN: [id number] DOB: Mar 23, 1936  Dayane Hillenburg Branscom is a 86 y.o. year old female who sees Stacks, Cletus Gash, MD for primary care. I spoke with [name] [place] by phone today.  What matters to the patients health and wellness today?   Assist with Obtaining [place].   Goals Addressed               This Visit's Progress     Assist with Obtaining Personal Care Services. (pt-stated)   On track     Care Coordination Interventions:  Solution-Focused Strategies Employed. Active Listening/Reflection Utilized.  Verbalization of Feelings Encouraged.  Emotional Support Provided. Caregiver Stress Acknowledged. Home Assessment to [place] in the Home, through [facility], Completed on 03/05/2022 at 10:30 am. CSW Collaboration with Representative from [facility] to Confirm Eligibility of 80 Hours of [place] Per Month. Review and Decide on [place] Provider of Choice, from List of [place] Providers, Emailed on 03/06/2022. Technical brewer Health @ # [phone], to Customer service manager ([place] Provider) Chosen.         SDOH assessments and interventions completed:  Yes.   Care Coordination Interventions Activated:  Yes.    Care Coordination Interventions:  Yes, provided.    Follow up plan: Follow up call scheduled for 03/20/2022 at 12:45 pm.   Encounter Outcome:  Pt. Visit Completed.    Nat Christen, BSW, MSW, LCSW  Licensed Education officer, environmental Health System  Mailing [place] [address]. [address], [place], [place] [address] Physical Address-[address], [place], [place] [address] Toll Free Main # [phone] Fax # [phone] Cell # [phone] Di [email or web address]

## 2022-03-06 NOTE — Patient Instructions (Addendum)
Visit Information  Thank you for taking time to visit with me today. Please don't hesitate to contact me if I can be of assistance to you.   Following are the goals we discussed today:   Goals Addressed               This Visit's Progress     Assist with Obtaining Personal Care Services. (pt-stated)   On track     Care Coordination Interventions:  Solution-Focused Strategies Employed. Active Listening/Reflection Utilized.  Verbalization of Feelings Encouraged.  Emotional Support Provided. Caregiver Stress Acknowledged. Home Assessment to [place] in the Home, through [facility], Completed on 03/05/2022 at 10:30 am. CSW Collaboration with Representative from [facility] to Confirm Eligibility of 80 Hours of [place] Per Month. Review and Decide on [place] Provider of Choice, from List of [place] Providers, Emailed on 03/06/2022. Technical brewer Health @ # [phone], to Customer service manager ([place] Provider) Chosen.           Our next appointment is by telephone on 03/20/2022 at 12:45 pm.  Please call the care guide team at [phone] if you need to cancel or reschedule your appointment.   If you are experiencing a Mental Health or [place] or need someone to talk to, please call the Suicide and Crisis Lifeline: 988 call the [place] National Suicide Prevention Lifeline: (919)465-7096 or TTY: 938-096-0363 TTY 7263178095) to talk to a trained counselor call 1-800-273-TALK (toll free, 24 hour hotline) go to [facility] [address], [place] [phone]) call the [place]: [phone] call 911  Patient verbalizes understanding of instructions and care plan provided today and agrees to view in [place]. Active MyChart status and patient understanding of how to access  instructions and care plan via MyChart confirmed with patient.     Telephone follow up appointment with care management team member scheduled for:  03/20/2022 at 12:45 pm.  Nat Christen, BSW, MSW, [place]  Licensed Clinical Social Worker  [place]  Mailing [place]. [address] [place], [place] [address] Physical Address-[address] [place], [place] [address] Toll Free Main # [phone] Fax # [phone] Cell # [phone] Di Kindle.[email or web address]

## 2022-03-09 NOTE — Progress Notes (Signed)
Cardiology Office Note    Date:  03/19/2022   ID:  [name], DOB 24-Feb-1936, MRN [id number]  PCP:  Claretta Fraise, MD  Cardiologist: Jenkins Rouge, MD      History of Present Illness:    [name] is a 86 y.o. female with past medical history of CAD (s/p DES to mid-LAD and DES to mid-OM1 in 48/2500), chronic systolic CHF/ICM (EF 37-04% in 2018, at 45% by repeat echo in 12/2018), HTN, HLD, RA, dilation of ascending aorta (at 4.2 cm by imaging in 2019) and COPD who presents to the office today post hospital 07/13/21   She had vertigo with dizziness with burning GERD like pain She wears oxygen at night and noted low pulse with low sats  R/O ECG with SB old LBBB Myovue  07/14/21 with old inferior infarct and apical infarct no ischemia EF estimated at 36% Dr Harl Bowie added imdur and felt medical Rx indicated since symptoms resolved and enzymes negative   Still living alone but should likely be in assisted living  Has son in [place] and daughter nearby that check on her   She is debilitated from arthritis in shoulders and knees has had right shoulder replacement   TTE showed EF 35-40% with moderate pericardial effusion no tamponade 01/25/22 Normal RV trivial MR and AV sclerosis Aorta only 3.4 cm   Need to call [place] as she indicates her med list not right Upset with her new driver this am He's too gruff   Past Medical History:  Diagnosis Date   Acute on chronic respiratory failure with hypoxia ([place]) 04/24/2018   Anxiety    Atypical chest pain    Bronchospasm    CAD (coronary artery disease)    a. s/p DES to mid-LAD and DES to mid-OM1 in 11/2016   Cervical disc disorder with myelopathy, unspecified cervical region    Cervicalgia 01/19/2009   Qualifier: Diagnosis of  By: Aline Brochure MD, [place]     Chest pain at rest 8/88/9169   Chronic systolic (congestive) heart failure (HCC)    COPD (chronic obstructive pulmonary disease) ([place])    Coronary artery disease    De Quervain's  disease (tenosynovitis) 04/02/2011   Disc disease with myelopathy, cervical    Essential hypertension    Hemorrhoids    Liver mass    Lung, cysts, congenital    Left lung cyst   Myocardial infarction ([place])    Nephrolithiasis    Embedded   Nonischemic cardiomyopathy ([place])    LVEF 35-40% 2015   On home O2    Osteoarthritis    Sprain of wrist 08/07/2012   Thoracic ascending aortic aneurysm (HCC)    4.3 cm April 2016    Past Surgical History:  Procedure Laterality Date   Benign breast tumors     CHOLECYSTECTOMY     COLONOSCOPY     COLONOSCOPY N/A 09/22/2014   Procedure: COLONOSCOPY;  Surgeon: Rogene [place], MD;  [facility];  Service: Endoscopy;  Laterality: N/A;  830 -- to be done in OR under fluoro   Complete hysterectomy     CORONARY STENT INTERVENTION N/A 11/23/2016   Procedure: Coronary Stent Intervention;  Surgeon: Martinique, Halima Fogal M, MD;  Location: [facility];  Service: Cardiovascular;  Laterality: N/A;   LEFT HEART CATH AND CORONARY ANGIOGRAPHY N/A 11/23/2016   Procedure: Left Heart Cath and Coronary Angiography;  Surgeon: Martinique, Ferry Matthis M, MD;  Location: [facility];  Service: Cardiovascular;  Laterality:  N/A;   TONSILLECTOMY AND ADENOIDECTOMY      Current Medications: Outpatient Medications Prior to Visit  Medication Sig Dispense Refill   albuterol (VENTOLIN HFA) 108 (90 Base) MCG/ACT inhaler INHALE 2 PUFFS INTO THE LUNGS EVERY 4 HOURS AS NEEDED. (Patient taking differently: Inhale 2 puffs into the lungs every 4 (four) hours as needed for wheezing or shortness of breath.) 18 g 2   alendronate (FOSAMAX) 70 MG tablet Take 1 tablet (70 mg total) by mouth every 7 (seven) days. Take with a full glass of water on an empty stomach. Do not lay down for at least 2 hours 4 tablet 11   Ascorbic Acid (VITAMIN C) 100 MG tablet Take 100 mg by mouth daily.     aspirin EC 81 MG tablet Take 81 mg by mouth every morning.      atorvastatin (LIPITOR) 40 MG tablet Take  1 tablet (40 mg total) by mouth every evening. 30 tablet 1   carvedilol (COREG) 6.25 MG tablet TAKE 1 TABLET BY MOUTH TWICE DAILY WITH MEALS. 60 tablet 3   Cholecalciferol (VITAMIN D) 50 MCG (2000 UT) tablet Take 1 tablet (2,000 Units total) by mouth daily. 90 tablet 3   clorazepate (TRANXENE) 7.5 MG tablet Take 1 tablet (7.5 mg total) by mouth daily as needed for anxiety. 30 tablet 2   Cranberry 500 MG TABS Take 1 tablet by mouth daily.     ENTRESTO 24-26 MG Take 1 tablet by mouth 2 (two) times daily. 60 tablet 5   Fluticasone-Umeclidin-Vilant (TRELEGY ELLIPTA) 100-62.5-25 MCG/ACT AEPB INHALE (1) PUFF INTO THE LUNGS DAILY (Patient taking differently: Inhale 1 puff into the lungs daily.) 60 each 11   isosorbide mononitrate (IMDUR) 30 MG 24 hr tablet TAKE (1/2) TABLET BY MOUTH DAILY. (Patient taking differently: 15 mg daily.) 45 tablet 3   leflunomide (ARAVA) 20 MG tablet Take 1 tablet (20 mg total) by mouth daily. 90 tablet 3   meclizine (ANTIVERT) 25 MG tablet Take 1 tablet (25 mg total) by mouth as needed for dizziness. 30 tablet 11   OXYGEN Inhale 2 L into the lungs at bedtime. Can use in the morning as needed     sulfamethoxazole-trimethoprim (BACTRIM DS) 800-160 MG tablet Take 1 tablet by mouth 2 (two) times daily. 14 tablet 0   traMADol (ULTRAM) 50 MG tablet Take 50 mg by mouth every 12 (twelve) hours as needed for moderate pain.     No facility-administered medications prior to visit.     Allergies:   Plavix [clopidogrel bisulfate], Calcium channel blockers, Alprazolam, Codeine, Percodan [oxycodone-aspirin], and Valium   Social History   Socioeconomic History   Marital status: Widowed    Spouse name: Not on file   Number of children: Not on file   Years of education: 9th   Highest education level: Not on file  Occupational History    Employer: RETIRED  Tobacco Use   Smoking status: Former    Packs/day: 0.25    Years: 3.00    Total pack years: 0.75    Types: Cigarettes     Start date: 05/14/1977    Quit date: 05/28/2008    Years since quitting: 13.8   Smokeless tobacco: Never   Tobacco comments:    patient states she only smoked for 3 years total  Vaping Use   Vaping Use: Never used  Substance and Sexual Activity   Alcohol use: No    Alcohol/week: 0.0 standard drinks of alcohol   Drug use: No  Sexual activity: Not Currently    Birth control/protection: Surgical    Comment: hyst  Other Topics Concern   Not on file  Social History Narrative   Not on file   Social Determinants of Health   Financial Resource Strain: Low Risk  (01/08/2022)   Overall Financial Resource Strain (CARDIA)    Difficulty of Paying Living Expenses: Not very hard  Food Insecurity: No Food Insecurity (01/25/2022)   Hunger Vital Sign    Worried About Running Out of Food in the Last Year: Never true    Ran Out of Food in the Last Year: Never true  Transportation Needs: No Transportation Needs (01/25/2022)   PRAPARE - Hydrologist (Medical): No    Lack of Transportation (Non-Medical): No  Physical Activity: Inactive (01/06/2021)   Exercise Vital Sign    Days of Exercise per Week: 0 days    Minutes of Exercise per Session: 0 min  Stress: Stress Concern Present (01/06/2021)   [place]    Feeling of Stress : Very much  Social Connections: Socially Isolated (01/06/2021)   Social Connection and Isolation Panel [NHANES]    Frequency of Communication with Friends and Family: Three times a week    Frequency of Social Gatherings with Friends and Family: Twice a week    Attends Religious Services: Never    Marine scientist or Organizations: No    Attends Archivist Meetings: Never    Marital Status: Widowed     Family History:  The patient's family history includes Aneurysm in her father; Diabetes in her brother; Heart disease in her brother, mother, and sister; Lung cancer in  her brother.   Review of Systems:   Please see the history of present illness.     General:  No chills, fever, night sweats or weight changes.  Cardiovascular:  No chest pain, edema, orthopnea, palpitations, paroxysmal nocturnal dyspnea. Positive for dyspnea on exertion.  Dermatological: No rash, lesions/masses Respiratory: No cough, dyspnea Urologic: No hematuria, dysuria Abdominal:   No nausea, vomiting, diarrhea, bright red blood per rectum, melena, or hematemesis Neurologic:  No visual changes, wkns, changes in mental status. All other systems reviewed and are otherwise negative except as noted above.   Physical Exam:    VS:  BP (!) 140/62   Pulse 77   Ht '5\' 2"'$  (1.575 m)   Wt 175 lb 12.8 oz (79.7 kg)   SpO2 94%   BMI 32.15 kg/m     Affect appropriate Healthy:  appears stated age 74: normal Neck supple with no adenopathy JVP normal no bruits no thyromegaly Lungs clear with no wheezing and good diaphragmatic motion Heart:  S1/S2 no murmur, no rub, gallop or click PMI normal Abdomen: benighn, BS positve, no tenderness, no AAA no bruit.  No HSM or HJR Distal pulses intact with no bruits No edema Neuro non-focal Skin warm and dry No muscular weakness   Wt Readings from Last 3 Encounters:  03/19/22 175 lb 12.8 oz (79.7 kg)  02/06/22 173 lb (78.5 kg)  02/02/22 173 lb (78.5 kg)     Studies/Labs Reviewed:   EKG:  03/19/2022 SR rate 58 LBBB chronic   Recent Labs: 01/24/2022: TSH 0.682 01/25/2022: Magnesium 2.1 02/02/2022: ALT 15 02/06/2022: B Natriuretic Peptide 251.0; BUN 14; Creatinine, Ser 1.01; Hemoglobin 11.9; Platelets 242; Potassium 4.3; Sodium 140   Lipid Panel    Component Value Date/Time   CHOL 205 (  H) 01/25/2022 0507   CHOL 217 (H) 12/28/2021 1308   TRIG 35 01/25/2022 0507   HDL 76 01/25/2022 0507   HDL 86 12/28/2021 1308   CHOLHDL 2.7 01/25/2022 0507   VLDL 7 01/25/2022 0507   LDLCALC 122 (H) 01/25/2022 0507   LDLCALC 116 (H) 12/28/2021 1308     Additional studies/ records that were reviewed today include:   Cardiac Catheterization: 01/4708 LV end diastolic pressure is normal. Mid Cx lesion, 30 %stenosed. Prox RCA lesion, 20 %stenosed. Prox LAD to Mid LAD lesion, 85 %stenosed. A STENT PROMUS PREM MR 3.5X32 drug eluting stent was successfully placed. Post intervention, there is a 0% residual stenosis. 1st Mrg lesion, 90 %stenosed. A STENT PROMUS PREM MR 2.5X16 drug eluting stent was successfully placed. Post intervention, there is a 0% residual stenosis.   1. 2 vessel obstructive CAD    - 85% segmental mid LAD    - 90% mid OM1.  2. Normal LVEDP 3. Successful stenting of the mid LAD with DES 4. Successful stenting of the mid OM1 with DES   Plan: DAPT for one year. Anticipate DC in am.    Echocardiogram: 01/25/22  IMPRESSIONS     1. Left ventricular ejection fraction, by estimation, is 35 to 40%. The  left ventricle has moderately decreased function. There is global  hypokinesis that appears slightly worse in the basal-to-mid inferior and  basal-to-mid inferoseptal segments. There  is mild concentric left ventricular hypertrophy. Indeterminate diastolic  filling due to E-A fusion. There is septal-lateral dyssynchrony in the  setting of a LBBB.   2. Right ventricular systolic function is normal. The right ventricular  size is normal. There is normal pulmonary artery systolic pressure. The  estimated right ventricular systolic pressure is 62.8 mmHg.   3. Moderate pericardial effusion. The pericardial effusion is localized  near the right ventricle and measures 1.6cm at max diameter. There is no  evidence of cardiac tamponade. Compared to prior TTE, the effusion appears  slightly larger on current study  but still within moderate range. Recommend serial monitoring with TTEs.   4. The mitral valve is normal in structure. Trivial mitral valve  regurgitation.   5. The aortic valve was not well visualized. There is  mild calcification  of the aortic valve. There is mild thickening of the aortic valve. Aortic  valve regurgitation is trivial. Aortic valve sclerosis/calcification is  present, without any evidence of  aortic stenosis. Aortic valve mean gradient measures 5.0 mmHg.   6. The inferior vena cava is normal in size with greater than 50%  respiratory variability, suggesting right atrial pressure of 3 mmHg.   7. Left atrial size was mildly dilated.    Myovue:  07/14/21 Study Result  Narrative & Impression      Findings are consistent with prior inferior myocardial infarction. Evidence of prior apical myocardial infarction with mild peri-infarct ischemia. The study is intermediate risk. Risk based primarily on decreased LVEF, there is fairly mild current ischemia.   No ST deviation was noted.   LV perfusion is abnormal.   Left ventricular function is abnormal. Nuclear stress EF: 36 %. The left ventricular ejection fraction is moderately decreased (30-44%).    Plan:   In order of problems listed above:  1. CAD - She is s/p DES to mid-LAD and DES to mid-OM1 in 11/2016.Low risk myovue in hospital 07/14/21  - Continue ASA '81mg'$  daily, Atorvastatin '80mg'$  daily and Coreg 12.'5mg'$  BID. Imdur added post hospital d/c March 2023  -  Note she has plavix listed as allergy with severe pruritis   2. Chronic Combined Systolic and Diastolic CHF - Her EF on echo 01/25/22 35-40% and 36% on myovue 07/14/21 Euvolemic - Continue entresto, ASA coreg    3. HTN - BP is well-controlled    4. HLD - She remains on Atorvastatin '80mg'$  daily with goal LDL less than 70 with known CAD.   5. Thoracic Aortic Aneurysm - Was improved to 4.2 cm by imaging in 2019.given age will not re image TTE 01/25/22 only 3.4 cm   6. COPD:  has home oxygen at night continue Trelegy and Ellipta with rescue inhalers   7. LBBB:  chronic yearly ECG no AV block    F/U in a year    Signed, Jenkins Rouge, MD  03/19/2022 10:45 AM    [facility]. [address] [place], [place] [address] Phone: [phone] Fax: [phone]

## 2022-03-19 ENCOUNTER — Encounter: Payer: Self-pay | Admitting: Cardiovascular Disease

## 2022-03-19 ENCOUNTER — Ambulatory Visit: Payer: Medicare Other | Attending: Cardiovascular Disease | Admitting: Cardiovascular Disease

## 2022-03-19 VITALS — BP 140/62 | HR 77 | Ht 62.0 in | Wt 175.8 lb

## 2022-03-19 DIAGNOSIS — I25118 Atherosclerotic heart disease of native coronary artery with other forms of angina pectoris: Secondary | ICD-10-CM

## 2022-03-19 DIAGNOSIS — I10 Essential (primary) hypertension: Secondary | ICD-10-CM | POA: Diagnosis not present

## 2022-03-19 DIAGNOSIS — E785 Hyperlipidemia, unspecified: Secondary | ICD-10-CM

## 2022-03-19 DIAGNOSIS — I429 Cardiomyopathy, unspecified: Secondary | ICD-10-CM

## 2022-03-19 DIAGNOSIS — I7121 Aneurysm of the ascending aorta, without rupture: Secondary | ICD-10-CM | POA: Diagnosis not present

## 2022-03-19 DIAGNOSIS — I447 Left bundle-branch block, unspecified: Secondary | ICD-10-CM | POA: Diagnosis not present

## 2022-03-19 DIAGNOSIS — I2510 Atherosclerotic heart disease of native coronary artery without angina pectoris: Secondary | ICD-10-CM | POA: Diagnosis not present

## 2022-03-19 NOTE — Patient Instructions (Signed)
Medication Instructions:  Your physician recommends that you continue on your current medications as directed. Please refer to the Current Medication list given to you today.   Labwork: None today  Testing/Procedures: None today  Follow-Up: 1 year  Any Other Special Instructions Will Be Listed Below (If Applicable).  If you need a refill on your cardiac medications before your next appointment, please call your pharmacy.  

## 2022-03-20 ENCOUNTER — Encounter: Payer: Self-pay | Admitting: *Deleted

## 2022-03-20 ENCOUNTER — Ambulatory Visit: Payer: Self-pay | Admitting: *Deleted

## 2022-03-20 NOTE — Patient Instructions (Signed)
Visit Information  Thank you for taking time to visit with me today. Please don't hesitate to contact me if I can be of assistance to you.   Following are the goals we discussed today:   Goals Addressed               This Visit's Progress     Assist with Obtaining Personal Care Services. (pt-stated)   On track     Care Coordination Interventions:  Solution-Focused Strategies Employed. Active Listening Utilized.  Caregiver Stress Acknowledged. Emotional and Caregiver Support Provided. CSW Environmental education officer from [employer] [phone]) to Confirm Eligibility of 80 Hours of [place] Per Month. CSW Environmental education officer from [employer] [phone]) to Notify of [place] Provider Selection - [facility]. Representative from [employer] Agreed to Submit Referral for [place] to [facility]. Please Accept All Calls from [facility], in An Effort to [place] in the Home.  Keep Follow-Up Appointment with Primary Care Provider, Dr. Claretta Fraise, Scheduled on 04/03/2022 at 10:55 am.         Our next appointment is by telephone on 04/03/2022 at 11:15 am.  Please call the care guide team at [phone] if you need to cancel or reschedule your appointment.   If you are experiencing a Mental Health or [place] or need someone to talk to, please call the Suicide and Crisis Lifeline: 988 call the [place] National Suicide Prevention Lifeline: (530)296-8938 or TTY: 873-237-1271 TTY 563-377-8451) to talk to a trained counselor call 1-800-273-TALK (toll free, 24 hour hotline) go to [facility] [address] [place] [phone]) call the [place]: [phone] call 911  Patient verbalizes understanding of instructions and care plan  provided today and agrees to view in [place]. Active MyChart status and patient understanding of how to access instructions and care plan via MyChart confirmed with patient.     Telephone follow up appointment with care management team member scheduled for:  04/03/2022 at 11:15 am.  Nat Christen, BSW, MSW, [place]  Licensed Clinical Social Worker  [place]  Mailing [place]. [address] [place], [place] [address] Physical Address[address], [place], [place] [address] Toll Free Main # [phone] Fax # [phone] Cell # [phone] [email or web address]

## 2022-03-20 NOTE — Patient Outreach (Signed)
  Care Coordination   Follow Up Visit Note   03/20/2022  Name: AVIRA TILLISON MRN: [id number] DOB: May 04, 1936  Norena Bratton Scheper is a 86 y.o. year old female who sees Stacks, Cletus Gash, MD for primary care. I spoke with [name] Rybarczyk by phone today.  What matters to the patients health and wellness today?  Assist with Obtaining [place].    Goals Addressed               This Visit's Progress     Assist with Obtaining Personal Care Services. (pt-stated)   On track     Care Coordination Interventions:  Solution-Focused Strategies Employed. Active Listening Utilized.  Caregiver Stress Acknowledged. Emotional and Caregiver Support Provided. CSW Environmental education officer from [employer] [phone]) to Confirm Eligibility of 80 Hours of [place] Per Month. CSW Environmental education officer from [employer] [phone]) to Notify of [place] Provider Selection - [facility]. Representative from [employer] Agreed to Submit Referral for [place] to [facility]. Please Accept All Calls from [facility], in An Effort to [place] in the Home.  Keep Follow-Up Appointment with Primary Care Provider, Dr. Claretta Fraise, Scheduled on 04/03/2022 at 10:55 am.         SDOH assessments and interventions completed:  Yes.   Care Coordination Interventions Activated:  Yes.    Care Coordination Interventions:  Yes, provided.    Follow up plan: Follow up call scheduled for 04/03/2022 at 11:15 am.   Encounter Outcome:  Pt. Visit Completed.    Nat Christen, BSW, MSW, LCSW  Licensed Education officer, environmental Health System  Mailing [place] [address] [place], [place] [address] Physical Address[address], [place], [place] [address] Toll Free Main # [phone] Fax #  [phone] Cell # [phone] [email or web address]

## 2022-03-21 ENCOUNTER — Other Ambulatory Visit: Payer: Medicare Other | Admitting: *Deleted

## 2022-03-21 ENCOUNTER — Encounter: Payer: Self-pay | Admitting: *Deleted

## 2022-03-21 ENCOUNTER — Ambulatory Visit: Payer: Self-pay | Admitting: *Deleted

## 2022-03-21 NOTE — Patient Outreach (Signed)
  Care Coordination   Follow Up Visit Note   03/21/2022  Name: [name] MRN: [id number] DOB: 1936/05/01  [name] is a 86 y.o. year old female who sees Stacks, [name] Gash, MD for primary care. I spoke with [name] by phone today.  What matters to the patients health and wellness today?  Assist with Obtaining [place].    Goals Addressed               This Visit's Progress     Assist with Obtaining Personal Care Services. (pt-stated)   On track     Care Coordination Interventions:  Solution-Focused Strategies Employed. Active Listening Utilized.  Caregiver Stress Acknowledged. Emotional and Caregiver Support Provided. CSW Environmental education officer from [employer] [phone]) to Confirm Eligibility of 80 Hours of [place] Per Month. CSW Environmental education officer from [employer] [phone]) to Notify of [employer] Provider Selection - [facility]. Representative from [employer] Agreed to Submit Referral for [place] to [facility]. Please Accept All Calls from [facility], in An Effort to [place] in the Home.  Keep Follow-Up Appointment with Primary Care Provider, Dr. Claretta [name], Scheduled on 04/03/2022 at 10:55 am. CSW Initial Outpatient Visit, to Review [employer] Dual Complete Coverage Plan, to Ensure Understanding.         SDOH assessments and interventions completed:  Yes.  Care Coordination Interventions Activated:  Yes.   Care Coordination Interventions:  Yes, provided.   Follow up plan: Follow up call scheduled for 04/03/2022 at 11:15 am.  Encounter Outcome:  Pt. Visit Completed.   [name], BSW, MSW, LCSW  Licensed Education officer, environmental Health System  Mailing [place] [address]  [place], [place] [address] Physical Address-[address], [place], [place] [address] Toll Free Main # [phone] Fax # [phone] Cell # [phone] [email or web address]

## 2022-03-21 NOTE — Patient Instructions (Signed)
Visit Information  Thank you for taking time to visit with me today. Please don't hesitate to contact me if I can be of assistance to you.   Following are the goals we discussed today:   Goals Addressed               This Visit's Progress     Assist with Obtaining Personal Care Services. (pt-stated)   On track     Care Coordination Interventions:  Solution-Focused Strategies Employed. Active Listening Utilized.  Caregiver Stress Acknowledged. Emotional and Caregiver Support Provided. CSW Environmental education officer from [employer] [phone]) to Confirm Eligibility of 80 Hours of [place] Per Month. CSW Environmental education officer from [employer] [phone]) to Notify of [place] Provider Selection - [facility]. Representative from [employer] Agreed to Submit Referral for [place] to [facility]. Please Accept All Calls from [facility], in An Effort to [place] in the Home.  Keep Follow-Up Appointment with Primary Care Provider, Dr. Claretta Fraise, Scheduled on 04/03/2022 at 10:55 am. CSW Initial Outpatient Visit, to Review [employer] Dual Complete Coverage Plan, to Ensure Understanding.         Our next appointment is by telephone on 04/03/2022 at 11:15 am.  Please call the care guide team at [phone] if you need to cancel or reschedule your appointment.   If you are experiencing a Mental Health or [place] or need someone to talk to, please call the Suicide and Crisis Lifeline: 988 call the [place] National Suicide Prevention Lifeline: 814 179 7104 or TTY: 817-783-2446 TTY 980-585-3428) to talk to a trained counselor call 1-800-273-TALK (toll free, 24 hour hotline) go to [facility] Urgent Care [address] [place] [phone]) call the  [place]: [phone] call 911  Patient verbalizes understanding of instructions and care plan provided today and agrees to view in [place]. Active MyChart status and patient understanding of how to access instructions and care plan via MyChart confirmed with patient.     Telephone follow up appointment with care management team member scheduled for:  04/03/2022 at 11:15 am.  Nat Christen, BSW, MSW, [place]  Licensed Clinical Social Worker  [place]  Mailing [place]. [address] [place], [place] [address] Physical Address-[address], [place], [place] [address] Toll Free Main # [phone] Fax # [phone] Cell # [phone] Di [email or web address]

## 2022-03-27 ENCOUNTER — Ambulatory Visit (INDEPENDENT_AMBULATORY_CARE_PROVIDER_SITE_OTHER): Payer: Medicare Other | Admitting: Gastroenterology

## 2022-04-03 ENCOUNTER — Encounter: Payer: Self-pay | Admitting: *Deleted

## 2022-04-03 ENCOUNTER — Ambulatory Visit: Payer: Self-pay | Admitting: *Deleted

## 2022-04-03 ENCOUNTER — Encounter: Payer: Self-pay | Admitting: Family Medicine

## 2022-04-03 ENCOUNTER — Ambulatory Visit (INDEPENDENT_AMBULATORY_CARE_PROVIDER_SITE_OTHER): Payer: Medicare Other | Admitting: Family Medicine

## 2022-04-03 VITALS — BP 159/77 | HR 88 | Temp 97.4°F | Ht 62.0 in | Wt 174.4 lb

## 2022-04-03 DIAGNOSIS — M0609 Rheumatoid arthritis without rheumatoid factor, multiple sites: Secondary | ICD-10-CM

## 2022-04-03 DIAGNOSIS — F419 Anxiety disorder, unspecified: Secondary | ICD-10-CM

## 2022-04-03 DIAGNOSIS — M7502 Adhesive capsulitis of left shoulder: Secondary | ICD-10-CM | POA: Diagnosis not present

## 2022-04-03 DIAGNOSIS — I2510 Atherosclerotic heart disease of native coronary artery without angina pectoris: Secondary | ICD-10-CM

## 2022-04-03 DIAGNOSIS — Z23 Encounter for immunization: Secondary | ICD-10-CM

## 2022-04-03 DIAGNOSIS — I10 Essential (primary) hypertension: Secondary | ICD-10-CM

## 2022-04-03 MED ORDER — TRIAMTERENE-HCTZ 37.5-25 MG PO TABS: 1.0000 | ORAL_TABLET | Freq: Every day | ORAL | 3 refills | Status: DC

## 2022-04-03 MED ORDER — TRAMADOL HCL 50 MG PO TABS: 50.0000 mg | ORAL_TABLET | Freq: Two times a day (BID) | ORAL | 2 refills | Status: DC | PRN

## 2022-04-03 MED ORDER — QUETIAPINE FUMARATE 25 MG PO TABS: 25.0000 mg | ORAL_TABLET | Freq: Every day | ORAL | 1 refills | Status: DC

## 2022-04-03 MED ORDER — CLORAZEPATE DIPOTASSIUM 7.5 MG PO TABS: 7.5000 mg | ORAL_TABLET | Freq: Every day | ORAL | 2 refills | Status: DC | PRN

## 2022-04-03 NOTE — Patient Instructions (Signed)
Visit Information  Thank you for taking time to visit with me today. Please don't hesitate to contact me if I can be of assistance to you.   Following are the goals we discussed today:   Goals Addressed               This Visit's Progress     Assist with Obtaining Personal Care Services. (pt-stated)   On track     Care Coordination Interventions:  Solution-Focused Strategies Employed. Active Listening/Reflection Utilized.  Emotional Support Provided. Feelings of Frustration Validated. CSW Environmental education officer from [employer] [phone]), to Inquire About Denial Letter for [place].  Keep Follow-Up Appointment with Primary Care Provider, Dr. Claretta Fraise, Scheduled on 04/03/2022 at 10:55 am.  ~ CSW Collaboration with Primary Care Provider, Dr. Claretta Fraise, to Discuss Re-Applying for [place], Under New Diagnoses. ~ [place] with Primary Care Provider, Dr. Claretta Fraise, to Provide New Personal Care Services Application.  ~ Please Complete New Personal Care Services Application with Primary Care Provider, Dr. Claretta Fraise. ~ [place] with Primary Care Provider, Dr. Claretta Fraise, to Request Nurse Fax Completed and Signed Personal Care Services Application to [facility] for Processing.          Our next appointment is by telephone on 04/17/2022 at 11:15 am.  Please call the care guide team at [phone] if you need to cancel or reschedule your appointment.   If you are experiencing a Mental Health or [place] or need someone to talk to, please call the Suicide and Crisis Lifeline: 988 call the [place] National Suicide Prevention Lifeline: (316)413-6041 or TTY: 440-347-0018 TTY (501) 374-0833) to talk to a trained counselor call 1-800-273-TALK (toll free, 24 hour hotline) go to [facility] [address], [place] [phone]) call the  [place]: [phone] call 911  Patient verbalizes understanding of instructions and care plan provided today and agrees to view in [place]. Active MyChart status and patient understanding of how to access instructions and care plan via MyChart confirmed with patient.     Telephone follow up appointment with care management team member scheduled for:  04/17/2022 at 11:15 am.  Nat Christen, BSW, MSW, [place]  Licensed Clinical Social Worker  [place]  Mailing [place]. [address], [place], [place] [address] Physical Address-[address] [place], [place] [address] Toll Free Main # [phone] Fax # [phone] Cell # [phone] [email or web address]

## 2022-04-03 NOTE — Patient Outreach (Signed)
  Care Coordination   Follow Up Visit Note   04/03/2022  Name: [name] MRN: [id number] DOB: [birth year]  [name] is a 86 y.o. year old female who sees Stacks, Cletus Gash, MD for primary care. I spoke with [name] by phone today.  What matters to the patients health and wellness today?  Assist with Obtaining [place].    Goals Addressed               This Visit's Progress     Assist with Obtaining Personal Care Services. (pt-stated)   On track     Care Coordination Interventions:  Solution-Focused Strategies Employed. Active Listening/Reflection Utilized.  Emotional Support Provided. Feelings of Frustration Validated. CSW Environmental education officer from [employer] [phone]), to Inquire About Denial Letter for [place].  Keep Follow-Up Appointment with Primary Care Provider, Dr. Claretta Fraise, Scheduled on 04/03/2022 at 10:55 am.  ~ CSW Collaboration with Primary Care Provider, Dr. Claretta Fraise, to Discuss Re-Applying for [place], Under New Diagnoses. ~ [place] with Primary Care Provider, Dr. Claretta Fraise, to Provide New Personal Care Services Application.  ~ Please Complete New Personal Care Services Application with Primary Care Provider, Dr. Claretta Fraise. ~ [place] with Primary Care Provider, Dr. Claretta Fraise, to Request Nurse Fax Completed and Signed Personal Care Services Application to [facility] for Processing.          SDOH assessments and interventions completed:  Yes.   Care Coordination Interventions Activated:  Yes.    Care Coordination Interventions:  Yes, provided.    Follow up plan: Follow up call scheduled for 04/17/2022 at 11:15 am.   Encounter Outcome:  Pt. Visit Completed.    Nat Christen, BSW, MSW, LCSW  Licensed Education officer, environmental [facility] System  Mailing [place] [address] [place], [place] [address] Physical Address-[address] [place], [place] [address] Toll Free Main # [phone] Fax # [phone] Cell # [phone] [email or web address]

## 2022-04-03 NOTE — Progress Notes (Addendum)
Subjective:  Patient ID: [name], female    DOB: 04/09/1936  Age: 86 y.o. MRN: [id number]  CC: Hypertension   HPI [name] presents for  presents for  follow-up of hypertension. Patient has no history of headache chest pain or shortness of breath or recent cough. Patient also denies symptoms of TIA such as focal numbness or weakness. Patient denies side effects from medication. States taking it regularly. Her dyspnea due to COPD is currently stable  Left arm functions poorly. Can't lift from the left. Arm cannot abduct or flex LUE. Had shoulder replacement that failed 14 years ago. Now limits her ability to care for her home.  Also taking tramadol for hips. Painful daily. Rainy today,  makes it worse. Needs assistance in the home due to this impeding her ability to perform ADLS including, grooming, toileting as well as IADLs.        04/03/2022   11:15 AM 10/26/2021    2:12 PM 09/26/2021    2:31 PM 09/11/2021    5:23 PM  GAD 7 : Generalized Anxiety Score  Nervous, Anxious, on Edge '3 1 3 2  '$ Control/stop worrying 2 1 0 1  Worry too much - different things 2 0 0 1  Trouble relaxing '2 1 2 1  '$ Restless '2 1 3 1  '$ Easily annoyed or irritable 1 0 0 1  Afraid - awful might happen '3 1 1 1  '$ Total GAD 7 Score '15 5 9 8  '$ Anxiety Difficulty Very difficult Somewhat difficult Not difficult at all Not difficult at all         04/03/2022   11:14 AM 04/03/2022   11:00 AM 01/24/2022   10:57 AM  Depression screen PHQ 2/9  Decreased Interest 1 0 1  Down, Depressed, Hopeless 1 0 0  PHQ - 2 Score 2 0 1  Altered sleeping 2    Tired, decreased energy 2    Change in appetite 2    Feeling bad or failure about yourself  1    Trouble concentrating 2    Moving slowly or fidgety/restless 2    Suicidal thoughts 0    PHQ-9 Score 13    Difficult doing work/chores Not difficult at all      History [name] has a past medical history of Acute on chronic respiratory failure with hypoxia ([place])  (04/24/2018), Anxiety, Atypical chest pain, Bronchospasm, CAD (coronary artery disease), Cervical disc disorder with myelopathy, unspecified cervical region, Cervicalgia (01/19/2009), Chest pain at rest (0/94/7096), Chronic systolic (congestive) heart failure ([place]), COPD (chronic obstructive pulmonary disease) ([place]), Coronary artery disease, De Quervain's disease (tenosynovitis) (04/02/2011), Disc disease with myelopathy, cervical, Essential hypertension, Hemorrhoids, Liver mass, Lung, cysts, congenital, Myocardial infarction ([place]), Nephrolithiasis, Nonischemic cardiomyopathy ([place]), On home O2, Osteoarthritis, Sprain of wrist (08/07/2012), and Thoracic ascending aortic aneurysm ([place]).   She has a past surgical history that includes Tonsillectomy and adenoidectomy; Complete hysterectomy; Benign breast tumors; Cholecystectomy; Colonoscopy; Colonoscopy (N/A, 09/22/2014); LEFT HEART CATH AND CORONARY ANGIOGRAPHY (N/A, 11/23/2016); and CORONARY STENT INTERVENTION (N/A, 11/23/2016).   Her family history includes Aneurysm in her father; Diabetes in her brother; Heart disease in her brother, mother, and sister; Lung cancer in her brother.She reports that she quit smoking about 13 years ago. Her smoking use included cigarettes. She started smoking about 44 years ago. She has a 0.75 pack-year smoking history. She has never used smokeless tobacco. She reports that she does not drink alcohol and does not use drugs.  ROS Review of Systems  Constitutional: Negative.   HENT: Negative.    Eyes:  Negative for visual disturbance.  Respiratory:  Positive for shortness of breath (chronic, currently stable on meds listed).   Cardiovascular:  Negative for chest pain.  Gastrointestinal:  Negative for abdominal pain.  Musculoskeletal:  Positive for arthralgias (hips, left shoulder).    Objective:  BP (!) 159/77   Pulse 88   Temp (!) 97.4 F (36.3 C)   Ht '5\' 2"'$  (1.575 m)   Wt 174 lb 6.4 oz (79.1 kg)   SpO2 94%   BMI  31.90 kg/m   BP Readings from Last 3 Encounters:  04/03/22 (!) 159/77  03/19/22 (!) 140/62  02/06/22 128/61    Wt Readings from Last 3 Encounters:  04/03/22 174 lb 6.4 oz (79.1 kg)  03/19/22 175 lb 12.8 oz (79.7 kg)  02/06/22 173 lb (78.5 kg)     Physical Exam Constitutional:      General: She is not in acute distress.    Appearance: She is well-developed.  HENT:     Head: Normocephalic and atraumatic.  Eyes:     Conjunctiva/sclera: Conjunctivae normal.     Pupils: Pupils are equal, round, and reactive to light.  Neck:     Thyroid: No thyromegaly.  Cardiovascular:     Rate and Rhythm: Normal rate and regular rhythm.     Heart sounds: Normal heart sounds. No murmur heard. Pulmonary:     Effort: Pulmonary effort is normal. No respiratory distress.     Breath sounds: Normal breath sounds. No wheezing or rales.  Abdominal:     General: Bowel sounds are normal. There is no distension.     Palpations: Abdomen is soft.     Tenderness: There is no abdominal tenderness.  Musculoskeletal:        General: Tenderness (left shoulder , both hips) present. Normal range of motion.     Cervical back: Normal range of motion and neck supple.     Comments: Antalgic, wide based gait.  Lymphadenopathy:     Cervical: No cervical adenopathy.  Skin:    General: Skin is warm and dry.  Neurological:     Mental Status: She is alert and oriented to person, place, and time.  Psychiatric:        Behavior: Behavior normal.        Thought Content: Thought content normal.        Judgment: Judgment normal.       Assessment & Plan:   [name] was seen today for hypertension.  Diagnoses and all orders for this visit:  Need for influenza vaccination -     Flu Vaccine QUAD High Dose(Fluad)  Anxiety -     QUEtiapine (SEROQUEL) 25 MG tablet; Take 1 tablet (25 mg total) by mouth at bedtime. -     clorazepate (TRANXENE) 7.5 MG tablet; Take 1 tablet (7.5 mg total) by mouth daily as needed for  anxiety.  Essential hypertension -     triamterene-hydrochlorothiazide (MAXZIDE-25) 37.5-25 MG tablet; Take 1 tablet by mouth daily. For blood pressure and fluid  Adhesive capsulitis of left shoulder -     traMADol (ULTRAM) 50 MG tablet; Take 1 tablet (50 mg total) by mouth every 12 (twelve) hours as needed for moderate pain.  Rheumatoid arthritis of multiple sites without rheumatoid factor (HCC) -     traMADol (ULTRAM) 50 MG tablet; Take 1 tablet (50 mg total) by mouth every 12 (twelve) hours as needed for  moderate pain.  Disability requiring Assistance for ADL's     I have changed [name]'s traMADol. I am also having her start on QUEtiapine and triamterene-hydrochlorothiazide. Additionally, I am having her maintain her aspirin EC, OXYGEN, meclizine, vitamin C, Cranberry, alendronate, Vitamin D, Trelegy Ellipta, Ventolin HFA, carvedilol, leflunomide, atorvastatin, isosorbide mononitrate, Entresto, celecoxib, and clorazepate.  Allergies as of 04/03/2022       Reactions   Plavix [clopidogrel Bisulfate] Itching   Severe itching   Calcium Channel Blockers Other (See Comments)   cramping   Alprazolam Nausea And Vomiting   Codeine Nausea And Vomiting   Percodan [oxycodone-aspirin] Nausea And Vomiting   Valium Nausea And Vomiting        Medication List        Accurate as of April 03, 2022 11:59 PM. If you have any questions, ask your nurse or doctor.          alendronate 70 MG tablet Commonly known as: FOSAMAX Take 1 tablet (70 mg total) by mouth every 7 (seven) days. Take with a full glass of water on an empty stomach. Do not lay down for at least 2 hours   aspirin EC 81 MG tablet Take 81 mg by mouth every morning.   atorvastatin 40 MG tablet Commonly known as: LIPITOR Take 1 tablet (40 mg total) by mouth every evening.   carvedilol 6.25 MG tablet Commonly known as: COREG TAKE 1 TABLET BY MOUTH TWICE DAILY WITH MEALS.   celecoxib 200 MG  capsule Commonly known as: CELEBREX Take 200 mg by mouth 2 (two) times daily.   clorazepate 7.5 MG tablet Commonly known as: TRANXENE Take 1 tablet (7.5 mg total) by mouth daily as needed for anxiety.   Cranberry 500 MG Tabs Take 1 tablet by mouth daily.   Entresto 24-26 MG Generic drug: sacubitril-valsartan Take 1 tablet by mouth 2 (two) times daily.   isosorbide mononitrate 30 MG 24 hr tablet Commonly known as: IMDUR TAKE (1/2) TABLET BY MOUTH DAILY. What changed: See the new instructions.   leflunomide 20 MG tablet Commonly known as: ARAVA Take 1 tablet (20 mg total) by mouth daily.   meclizine 25 MG tablet Commonly known as: ANTIVERT Take 1 tablet (25 mg total) by mouth as needed for dizziness.   OXYGEN Inhale 2 L into the lungs at bedtime. Can use in the morning as needed   QUEtiapine 25 MG tablet Commonly known as: SEROQUEL Take 1 tablet (25 mg total) by mouth at bedtime. Started by: Claretta Fraise, MD   traMADol 50 MG tablet Commonly known as: ULTRAM Take 1 tablet (50 mg total) by mouth every 12 (twelve) hours as needed for moderate pain.   Trelegy Ellipta 100-62.5-25 MCG/ACT Aepb Generic drug: Fluticasone-Umeclidin-Vilant INHALE (1) PUFF INTO THE LUNGS DAILY What changed: See the new instructions.   triamterene-hydrochlorothiazide 37.5-25 MG tablet Commonly known as: MAXZIDE-25 Take 1 tablet by mouth daily. For blood pressure and fluid Started by: Claretta Fraise, MD   Ventolin HFA 108 (90 Base) MCG/ACT inhaler Generic drug: albuterol INHALE 2 PUFFS INTO THE LUNGS EVERY 4 HOURS AS NEEDED. What changed: See the new instructions.   vitamin C 100 MG tablet Take 100 mg by mouth daily.   Vitamin D 50 MCG (2000 UT) tablet Take 1 tablet (2,000 Units total) by mouth daily.         Follow-up: Return in about 2 months (around 06/03/2022).  Claretta Fraise, M.D.

## 2022-04-17 ENCOUNTER — Ambulatory Visit: Payer: Self-pay | Admitting: *Deleted

## 2022-04-18 ENCOUNTER — Encounter: Payer: Self-pay | Admitting: *Deleted

## 2022-04-18 NOTE — Patient Outreach (Signed)
  Care Coordination   Follow Up Visit Note   04/18/2022  Name: [name] MRN: [id number] DOB: 1935/12/21  [name] is a 86 y.o. year old female who sees Stacks, Cletus Gash, MD for primary care. I spoke with [name] Dirocco by phone today.  What matters to the patients health and wellness today?  Assist with Obtaining [place].    Goals Addressed               This Visit's Progress     Assist with Obtaining Personal Care Services. (pt-stated)   On track     Care Coordination Interventions:  Solution-Focused Strategies Employed. Active Listening/Reflection Utilized.  Emotional Support Provided. Verbalization of Feelings Encouraged. Validation of [employer] Chosen, through [employer] Dual Complete, Effective 05/14/2022. Collaboration with Representative from [employer] [phone]), to Inquire About Denial Letter for Personal Care Services.   ~ HIPAA Compliant Messages Left on Voicemail, as CSW Continues to Await a Return Call. CSW Collaboration with Primary Care Provider, Dr. Claretta Fraise, to Discuss Re-Applying for [place] Under New Diagnoses. ~ Primary Care Provider, Dr. Claretta Fraise Is In the Process of [place] Application, Reflecting New Diagnosis. CSW Collaboration with Primary Care Provider, Dr. Claretta Fraise, to Request Nurse Fax New Completed and Signed Personal Care Services Application to [facility] for Processing. Please Review List of "Handy Man" Services, Agencies and Resources Provided. Begin Contacting "Handy Man" Services, Agencies and Resources of Interest, in an Effort to [employer].            SDOH assessments and interventions completed:  Yes.  Care Coordination Interventions:  Yes, provided.   Follow up plan: Follow up call scheduled for 05/01/2022 at 9:30 am.  Encounter Outcome:  Pt. Visit Completed.   Nat Christen, BSW,  MSW, LCSW  Licensed Education officer, environmental [facility] System  Mailing [place] [address] [place], [place] [address] Physical Address-[address] [place], [place] [address] Toll Free Main # [phone] Fax # [phone] Cell # [phone] [email or web address]

## 2022-04-18 NOTE — Patient Instructions (Signed)
Visit Information  Thank you for taking time to visit with me today. Please don't hesitate to contact me if I can be of assistance to you.   Following are the goals we discussed today:   Goals Addressed               This Visit's Progress     Assist with Obtaining Personal Care Services. (pt-stated)   On track     Care Coordination Interventions:  Solution-Focused Strategies Employed. Active Listening/Reflection Utilized.  Emotional Support Provided. Verbalization of Feelings Encouraged. Validation of [employer] Chosen, through [employer] Dual Complete, Effective 05/14/2022. Collaboration with Representative from [employer] [phone]), to Inquire About Denial Letter for Personal Care Services.   ~ HIPAA Compliant Messages Left on Voicemail, as CSW Continues to Await a Return Call. CSW Collaboration with Primary Care Provider, Dr. Claretta Fraise, to Discuss Re-Applying for [place] Under New Diagnoses. ~ Primary Care Provider, Dr. Claretta Fraise Is In the Process of [place] Application, Reflecting New Diagnosis. CSW Collaboration with Primary Care Provider, Dr. Claretta Fraise, to Request Nurse Fax New Completed and Signed Personal Care Services Application to [facility] for Processing. Please Review List of "Handy Man" Services, Agencies and Resources Provided. Begin Contacting "Handy Man" Services, Agencies and Resources of Interest, in an Effort to [employer].            Our next appointment is by telephone on 05/01/2022 at 9:30 am.  Please call the care guide team at [phone] if you need to cancel or reschedule your appointment.   If you are experiencing a Mental Health or [place] or need someone to talk to, please call the Suicide and Crisis Lifeline: 988 call the [place] National Suicide Prevention Lifeline: 5625306387 or TTY: 860-236-4010 TTY  6283792847) to talk to a trained counselor call 1-800-273-TALK (toll free, 24 hour hotline) go to [facility] [address] [place] [phone]) call the [place]: [phone] call 911  Patient verbalizes understanding of instructions and care plan provided today and agrees to view in [place]. Active MyChart status and patient understanding of how to access instructions and care plan via MyChart confirmed with patient.     Telephone follow up appointment with care management team member scheduled for:  05/01/2022 at 9:30 am.  Nat Christen, BSW, MSW, [place]  Licensed Clinical Social Worker  [place]  Mailing [place]. [address], [place], [place] [address] Physical Address-[address], [place],  [address] Toll Free Main # [phone] Fax # [phone] Cell # [phone] [email or web address]

## 2022-04-20 ENCOUNTER — Ambulatory Visit (INDEPENDENT_AMBULATORY_CARE_PROVIDER_SITE_OTHER): Payer: Medicare Other

## 2022-04-20 VITALS — Ht 62.0 in | Wt 174.0 lb

## 2022-04-20 DIAGNOSIS — Z0000 Encounter for general adult medical examination without abnormal findings: Secondary | ICD-10-CM

## 2022-04-20 NOTE — Patient Instructions (Signed)
[name] , Thank you for taking time to come for your Medicare Wellness Visit. I appreciate your ongoing commitment to your health goals. Please review the following plan we discussed and let me know if I can assist you in the future.   These are the goals we discussed:  Goals       Assist with Obtaining Personal Care Services. (pt-stated)      Care Coordination Interventions:  Solution-Focused Strategies Employed. Active Listening/Reflection Utilized.  Emotional Support Provided. Verbalization of Feelings Encouraged. Validation of [employer] Chosen, through [employer], Effective 05/14/2022. Collaboration with Representative from [employer] [phone]), to Inquire About Denial Letter for Personal Care Services.   ~ HIPAA Compliant Messages Left on Voicemail, as CSW Continues to Await a Return Call. CSW Collaboration with Primary Care Provider, Dr. Claretta Fraise, to Discuss Re-Applying for [place] Under New Diagnoses. ~ Primary Care Provider, Dr. Claretta Fraise Is In the Process of [place] Application, Reflecting New Diagnosis. CSW Collaboration with Primary Care Provider, Dr. Claretta Fraise, to Request Nurse Fax New Completed and Signed Personal Care Services Application to [facility] for Processing. Please Review List of "Handy Man" Services, Agencies and Resources Provided. Begin Contacting "Handy Man" Services, Agencies and Resources of Interest, in an Effort to [employer].            Patient Stated (pt-stated)      Patient states she needs a home aide to assist her at home. It has become difficult for her to perform ADLs.       Reduce alcohol intake        This is a list of the screening recommended for you and due dates:  Health Maintenance  Topic Date Due   COVID-19 Vaccine (1) Never done   DTaP/Tdap/Td vaccine (1 - Tdap) Never done   Zoster (Shingles)  Vaccine (1 of 2) Never done   Pneumonia Vaccine (2 - PCV) 09/12/2022*   Medicare Annual Wellness Visit  04/21/2023   Flu Shot  Completed   DEXA scan (bone density measurement)  Completed   HPV Vaccine  Aged Out  *Topic was postponed. The date shown is not the original due date.    Advanced directives: Advance directive discussed with you today. I have provided a copy for you to complete at home and have notarized. Once this is complete please bring a copy in to our office so we can scan it into your chart.   Conditions/risks identified: Aim for 30 minutes of exercise or brisk walking, 6-8 glasses of water, and 5 servings of fruits and vegetables each day.   Next appointment: Follow up in one year for your annual wellness visit    Preventive Care 65 Years and Older, Female Preventive care refers to lifestyle choices and visits with your health care provider that can promote health and wellness. What does preventive care include? A yearly physical exam. This is also called an annual well check. Dental exams once or twice a year. Routine eye exams. Ask your health care provider how often you should have your eyes checked. Personal lifestyle choices, including: Daily care of your teeth and gums. Regular physical activity. Eating a healthy diet. Avoiding tobacco and drug use. Limiting alcohol use. Practicing safe sex. Taking low-dose aspirin every day. Taking vitamin and mineral supplements as recommended by your health care provider. What happens during an annual well check? The services and screenings done by your health care  provider during your annual well check will depend on your age, overall health, lifestyle risk factors, and family history of disease. Counseling  Your health care provider may ask you questions about your: Alcohol use. Tobacco use. Drug use. Emotional well-being. Home and relationship well-being. Sexual activity. Eating habits. History of falls. Memory  and ability to understand (cognition). Work and work Statistician. Reproductive health. Screening  You may have the following tests or measurements: Height, weight, and BMI. Blood pressure. Lipid and cholesterol levels. These may be checked every 5 years, or more frequently if you are over 68 years old. Skin check. Lung cancer screening. You may have this screening every year starting at age 32 if you have a 30-pack-year history of smoking and currently smoke or have quit within the past 15 years. Fecal occult blood test (FOBT) of the stool. You may have this test every year starting at age 24. Flexible sigmoidoscopy or colonoscopy. You may have a sigmoidoscopy every 5 years or a colonoscopy every 10 years starting at age 39. Hepatitis C blood test. Hepatitis B blood test. Sexually transmitted disease (STD) testing. Diabetes screening. This is done by checking your blood sugar (glucose) after you have not eaten for a while (fasting). You may have this done every 1-3 years. Bone density scan. This is done to screen for osteoporosis. You may have this done starting at age 73. Mammogram. This may be done every 1-2 years. Talk to your health care provider about how often you should have regular mammograms. Talk with your health care provider about your test results, treatment options, and if necessary, the need for more tests. Vaccines  Your health care provider may recommend certain vaccines, such as: Influenza vaccine. This is recommended every year. Tetanus, diphtheria, and acellular pertussis (Tdap, Td) vaccine. You may need a Td booster every 10 years. Zoster vaccine. You may need this after age 1. Pneumococcal 13-valent conjugate (PCV13) vaccine. One dose is recommended after age 27. Pneumococcal polysaccharide (PPSV23) vaccine. One dose is recommended after age 38. Talk to your health care provider about which screenings and vaccines you need and how often you need them. This  information is not intended to replace advice given to you by your health care provider. Make sure you discuss any questions you have with your health care provider. Document Released: 05/27/2015 Document Revised: 01/18/2016 Document Reviewed: 03/01/2015 Elsevier Interactive Patient Education  2017 [place] Prevention in the Home Falls can cause injuries. They can happen to people of all ages. There are many things you can do to make your home safe and to help prevent falls. What can I do on the outside of my home? Regularly fix the edges of walkways and driveways and fix any cracks. Remove anything that might make you trip as you walk through a door, such as a raised step or threshold. Trim any bushes or trees on the path to your home. Use bright outdoor lighting. Clear any walking paths of anything that might make someone trip, such as rocks or tools. Regularly check to see if handrails are loose or broken. Make sure that both sides of any steps have handrails. Any raised decks and porches should have guardrails on the edges. Have any leaves, snow, or ice cleared regularly. Use sand or salt on walking paths during winter. Clean up any spills in your garage right away. This includes oil or grease spills. What can I do in the bathroom? Use night lights. Install grab bars by the  toilet and in the tub and shower. Do not use towel bars as grab bars. Use non-skid mats or decals in the tub or shower. If you need to sit down in the shower, use a plastic, non-slip stool. Keep the floor dry. Clean up any water that spills on the floor as soon as it happens. Remove soap buildup in the tub or shower regularly. Attach bath mats securely with double-sided non-slip rug tape. Do not have throw rugs and other things on the floor that can make you trip. What can I do in the bedroom? Use night lights. Make sure that you have a light by your bed that is easy to reach. Do not use any sheets or  blankets that are too big for your bed. They should not hang down onto the floor. Have a firm chair that has side arms. You can use this for support while you get dressed. Do not have throw rugs and other things on the floor that can make you trip. What can I do in the kitchen? Clean up any spills right away. Avoid walking on wet floors. Keep items that you use a lot in easy-to-reach places. If you need to reach something above you, use a strong step stool that has a grab bar. Keep electrical cords out of the way. Do not use floor polish or wax that makes floors slippery. If you must use wax, use non-skid floor wax. Do not have throw rugs and other things on the floor that can make you trip. What can I do with my stairs? Do not leave any items on the stairs. Make sure that there are handrails on both sides of the stairs and use them. Fix handrails that are broken or loose. Make sure that handrails are as long as the stairways. Check any carpeting to make sure that it is firmly attached to the stairs. Fix any carpet that is loose or worn. Avoid having throw rugs at the top or bottom of the stairs. If you do have throw rugs, attach them to the floor with carpet tape. Make sure that you have a light switch at the top of the stairs and the bottom of the stairs. If you do not have them, ask someone to add them for you. What else can I do to help prevent falls? Wear shoes that: Do not have high heels. Have rubber bottoms. Are comfortable and fit you well. Are closed at the toe. Do not wear sandals. If you use a stepladder: Make sure that it is fully opened. Do not climb a closed stepladder. Make sure that both sides of the stepladder are locked into place. Ask someone to hold it for you, if possible. Clearly mark and make sure that you can see: Any grab bars or handrails. First and last steps. Where the edge of each step is. Use tools that help you move around (mobility aids) if they are  needed. These include: Canes. Walkers. Scooters. Crutches. Turn on the lights when you go into a dark area. Replace any light bulbs as soon as they burn out. Set up your furniture so you have a clear path. Avoid moving your furniture around. If any of your floors are uneven, fix them. If there are any pets around you, be aware of where they are. Review your medicines with your doctor. Some medicines can make you feel dizzy. This can increase your chance of falling. Ask your doctor what other things that you can do to help prevent  falls. This information is not intended to replace advice given to you by your health care provider. Make sure you discuss any questions you have with your health care provider. Document Released: 02/24/2009 Document Revised: 10/06/2015 Document Reviewed: 06/04/2014 Elsevier Interactive Patient Education  2017 [employer].

## 2022-04-20 NOTE — Progress Notes (Signed)
Subjective:   [name] is a 86 y.o. female who presents for Medicare Annual (Subsequent) preventive examination. I connected with  [name] on 04/20/22 by a audio enabled telemedicine application and verified that I am speaking with the correct person using two identifiers.  Patient Location: Home  Provider Location: Home Office  I discussed the limitations of evaluation and management by telemedicine. The patient expressed understanding and agreed to proceed.  Review of Systems     Cardiac Risk Factors include: advanced age (>9mn, >>25women);hypertension     Objective:    Today's Vitals   04/20/22 0903  Weight: 174 lb (78.9 kg)  Height: '5\' 2"'$  (1.575 m)   Body mass index is 31.83 kg/m.     04/20/2022    9:12 AM 02/06/2022    4:29 PM 01/25/2022   12:54 AM 01/24/2022   11:01 AM 07/13/2021    3:54 PM 01/06/2021    4:08 PM 10/08/2020   12:55 PM  Advanced Directives  Does Patient Have a Medical Advance Directive? No Yes Yes Yes No No No  Type of ACorporate treasurerof APioneerLiving will Living will;Healthcare Power of APindallLiving will     Does patient want to make changes to medical advance directive?  No - Patient declined No - Patient declined No - Patient declined     Copy of [place] Chart?  No - copy requested No - copy requested No - copy requested     Would patient like information on creating a medical advance directive? No - Patient declined    Yes (Inpatient - patient requests chaplain consult to create a medical advance directive) No - Patient declined No - Patient declined    Current Medications (verified) Outpatient Encounter Medications as of 04/20/2022  Medication Sig   albuterol (VENTOLIN HFA) 108 (90 Base) MCG/ACT inhaler INHALE 2 PUFFS INTO THE LUNGS EVERY 4 HOURS AS NEEDED. (Patient taking differently: Inhale 2 puffs into the lungs every 4 (four) hours as needed for wheezing or  shortness of breath.)   alendronate (FOSAMAX) 70 MG tablet Take 1 tablet (70 mg total) by mouth every 7 (seven) days. Take with a full glass of water on an empty stomach. Do not lay down for at least 2 hours   Ascorbic Acid (VITAMIN C) 100 MG tablet Take 100 mg by mouth daily.   aspirin EC 81 MG tablet Take 81 mg by mouth every morning.    atorvastatin (LIPITOR) 40 MG tablet Take 1 tablet (40 mg total) by mouth every evening.   carvedilol (COREG) 6.25 MG tablet TAKE 1 TABLET BY MOUTH TWICE DAILY WITH MEALS.   celecoxib (CELEBREX) 200 MG capsule Take 200 mg by mouth 2 (two) times daily.   Cholecalciferol (VITAMIN D) 50 MCG (2000 UT) tablet Take 1 tablet (2,000 Units total) by mouth daily.   clorazepate (TRANXENE) 7.5 MG tablet Take 1 tablet (7.5 mg total) by mouth daily as needed for anxiety.   Cranberry 500 MG TABS Take 1 tablet by mouth daily.   ENTRESTO 24-26 MG Take 1 tablet by mouth 2 (two) times daily.   Fluticasone-Umeclidin-Vilant (TRELEGY ELLIPTA) 100-62.5-25 MCG/ACT AEPB INHALE (1) PUFF INTO THE LUNGS DAILY (Patient taking differently: Inhale 1 puff into the lungs daily.)   isosorbide mononitrate (IMDUR) 30 MG 24 hr tablet TAKE (1/2) TABLET BY MOUTH DAILY. (Patient taking differently: 15 mg daily.)   leflunomide (ARAVA) 20 MG tablet Take 1  tablet (20 mg total) by mouth daily.   meclizine (ANTIVERT) 25 MG tablet Take 1 tablet (25 mg total) by mouth as needed for dizziness.   OXYGEN Inhale 2 L into the lungs at bedtime. Can use in the morning as needed   QUEtiapine (SEROQUEL) 25 MG tablet Take 1 tablet (25 mg total) by mouth at bedtime.   traMADol (ULTRAM) 50 MG tablet Take 1 tablet (50 mg total) by mouth every 12 (twelve) hours as needed for moderate pain.   triamterene-hydrochlorothiazide (MAXZIDE-25) 37.5-25 MG tablet Take 1 tablet by mouth daily. For blood pressure and fluid   No facility-administered encounter medications on file as of 04/20/2022.    Allergies (verified) Plavix  [clopidogrel bisulfate], Calcium channel blockers, Alprazolam, Codeine, Percodan [oxycodone-aspirin], and Valium   History: Past Medical History:  Diagnosis Date   Acute on chronic respiratory failure with hypoxia ([place]) 04/24/2018   Anxiety    Atypical chest pain    Bronchospasm    CAD (coronary artery disease)    a. s/p DES to mid-LAD and DES to mid-OM1 in 11/2016   Cervical disc disorder with myelopathy, unspecified cervical region    Cervicalgia 01/19/2009   Qualifier: Diagnosis of  By: Aline Brochure MD, Stanley     Chest pain at rest 1/61/0960   Chronic systolic (congestive) heart failure (HCC)    COPD (chronic obstructive pulmonary disease) (HCC)    Coronary artery disease    De Quervain's disease (tenosynovitis) 04/02/2011   Disc disease with myelopathy, cervical    Essential hypertension    Hemorrhoids    Liver mass    Lung, cysts, congenital    Left lung cyst   Myocardial infarction ([name])    Nephrolithiasis    Embedded   Nonischemic cardiomyopathy ([place])    LVEF 35-40% 2015   On home O2    Osteoarthritis    Sprain of wrist 08/07/2012   Thoracic ascending aortic aneurysm (HCC)    4.3 cm April 2016   Past Surgical History:  Procedure Laterality Date   Benign breast tumors     CHOLECYSTECTOMY     COLONOSCOPY     COLONOSCOPY N/A 09/22/2014   Procedure: COLONOSCOPY;  Surgeon: Rogene Houston, MD;  [facility];  Service: Endoscopy;  Laterality: N/A;  830 -- to be done in OR under fluoro   Complete hysterectomy     CORONARY STENT INTERVENTION N/A 11/23/2016   Procedure: Coronary Stent Intervention;  Surgeon: Martinique, Peter M, MD;  Location: [place] [facility] LAB;  Service: Cardiovascular;  Laterality: N/A;   LEFT HEART CATH AND CORONARY ANGIOGRAPHY N/A 11/23/2016   Procedure: Left Heart Cath and Coronary Angiography;  Surgeon: Martinique, Peter M, MD;  Location: [place] CV LAB;  Service: Cardiovascular;  Laterality: N/A;   TONSILLECTOMY AND ADENOIDECTOMY     Family  History  Problem Relation Age of Onset   Heart disease Mother    Aneurysm Father    Lung cancer Brother    Heart disease Sister    Diabetes Brother    Heart disease Brother    Social History   Socioeconomic History   Marital status: Widowed    Spouse name: Not on file   Number of children: Not on file   Years of education: 9th   Highest education level: Not on file  Occupational History    Employer: RETIRED  Tobacco Use   Smoking status: Former    Packs/day: 0.25    Years: 3.00    Total pack  years: 0.75    Types: Cigarettes    Start date: 05/14/1977    Quit date: 05/28/2008    Years since quitting: 13.9   Smokeless tobacco: Never   Tobacco comments:    patient states she only smoked for 3 years total  Vaping Use   Vaping Use: Never used  Substance and Sexual Activity   Alcohol use: No    Alcohol/week: 0.0 standard drinks of alcohol   Drug use: No   Sexual activity: Not Currently    Birth control/protection: Surgical    Comment: hyst  Other Topics Concern   Not on file  Social History Narrative   Not on file   Social Determinants of Health   Financial Resource Strain: Low Risk  (04/20/2022)   Overall Financial Resource Strain (CARDIA)    Difficulty of Paying Living Expenses: Not hard at all  Food Insecurity: No Food Insecurity (04/20/2022)   Hunger Vital Sign    Worried About Running Out of Food in the Last Year: Never true    Ran Out of Food in the Last Year: Never true  Transportation Needs: No Transportation Needs (04/20/2022)   PRAPARE - Hydrologist (Medical): No    Lack of Transportation (Non-Medical): No  Physical Activity: Insufficiently Active (04/20/2022)   Exercise Vital Sign    Days of Exercise per Week: 2 days    Minutes of Exercise per Session: 20 min  Stress: No Stress Concern Present (04/20/2022)   [place]    Feeling of Stress : Not at all  Social  Connections: Moderately Isolated (04/20/2022)   Social Connection and Isolation Panel [NHANES]    Frequency of Communication with Friends and Family: More than three times a week    Frequency of Social Gatherings with Friends and Family: More than three times a week    Attends Religious Services: 1 to 4 times per year    Active Member of [employer] or Organizations: No    Attends Archivist Meetings: Never    Marital Status: Widowed    Tobacco Counseling Counseling given: Not Answered Tobacco comments: patient states she only smoked for 3 years total   Clinical Intake:  Pre-visit preparation completed: Yes  Pain : No/denies pain     Nutritional Risks: None Diabetes: No  How often do you need to have someone help you when you read instructions, pamphlets, or other written materials from your doctor or pharmacy?: 1 - Never  Diabetic?no   Interpreter Needed?: No  Information entered by :: Jadene Pierini, LPN   Activities of Daily Living    04/20/2022    9:12 AM 01/25/2022    1:07 AM  In your present state of health, do you have any difficulty performing the following activities:  Hearing? 0   Vision? 0   Difficulty concentrating or making decisions? 0 0  Walking or climbing stairs? 0   Dressing or bathing? 0   Doing errands, shopping? 0   Preparing Food and eating ? N   Using the Toilet? N   In the past six months, have you accidently leaked urine? N   Do you have problems with loss of bowel control? N   Managing your Medications? N   Managing your Finances? N   Housekeeping or managing your Housekeeping? N     Patient Care Team: Claretta Fraise, MD as PCP - General (Family Medicine) Josue Hector, MD as PCP -  Cardiology (Cardiology) Saporito, Maree Erie, LCSW as Social Worker (Licensed Clinical Social Worker)  Indicate any recent [employer] you may have received from other than [facility] providers in the past year (date may be approximate).      Assessment:   This is a routine wellness examination for Aleynah.  Hearing/Vision screen Vision Screening - Comments:: Wears rx glasses - up to date with routine eye exams with  [facility]   Dietary issues and exercise activities discussed: Current Exercise Habits: Home exercise routine, Type of exercise: walking, Time (Minutes): 20, Frequency (Times/Week): 2, Weekly Exercise (Minutes/Week): 40, Intensity: Mild, Exercise limited by: orthopedic condition(s);cardiac condition(s)   Goals Addressed               This Visit's Progress     Assist with Obtaining Personal Care Services. (pt-stated)   Not on track     Care Coordination Interventions:  Solution-Focused Strategies Employed. Active Listening/Reflection Utilized.  Emotional Support Provided. Verbalization of Feelings Encouraged. Validation of [employer] Chosen, through [employer] Dual [employer], Effective 05/14/2022. Collaboration with Representative from [employer] [phone]), to Inquire About Denial Letter for Personal Care Services.   ~ HIPAA Compliant Messages Left on Voicemail, as CSW Continues to Await a Return Call. CSW Collaboration with Primary Care Provider, Dr. Claretta Fraise, to Discuss Re-Applying for [place] Under New Diagnoses. ~ Primary Care Provider, Dr. Claretta Fraise Is In the Process of [place] Application, Reflecting New Diagnosis. CSW Collaboration with Primary Care Provider, Dr. Claretta Fraise, to Request Nurse Fax New Completed and Signed Personal Care Services Application to [facility] for Processing. Please Review List of "Handy Man" Services, Agencies and Resources Provided. Begin Contacting "Handy Man" Services, Agencies and Resources of Interest, in an Effort to [employer].            Patient Stated (pt-stated)   Not on track     Patient states she needs a home aide to assist her at home.  It has become difficult for her to perform ADLs.        Depression Screen    04/20/2022    9:09 AM 04/03/2022   11:14 AM 04/03/2022   11:00 AM 01/24/2022   10:57 AM 12/28/2021    1:17 PM 12/28/2021    1:01 PM 10/26/2021    2:11 PM  PHQ 2/9 Scores  PHQ - 2 Score 0 2 0 1 5 0 1  PHQ- 9 Score 0 '13   18  4    '$ Fall Risk    04/20/2022    9:06 AM 04/03/2022   11:00 AM 01/24/2022   10:57 AM 01/24/2022   10:51 AM 01/08/2022    2:25 PM  Fall Risk   Falls in the past year? 0 0 '1 1 1  '$ Number falls in past yr: 0  1    Injury with Fall? 0  0    Risk for fall due to : No Fall Risks  History of fall(s);Impaired balance/gait;Impaired mobility;Mental status change    Follow up Falls prevention discussed  Falls evaluation completed;Education provided;Falls prevention discussed      FALL RISK PREVENTION PERTAINING TO THE HOME:  Any stairs in or around the home? Yes  If so, are there any without handrails? No  Home free of loose throw rugs in walkways, pet beds, electrical cords, etc? Yes  Adequate lighting in your home to reduce risk of falls? Yes   ASSISTIVE DEVICES UTILIZED TO  PREVENT FALLS:  Life alert? No  Use of a cane, walker or w/c? Yes  Grab bars in the bathroom? Yes  Shower chair or bench in shower? Yes  Elevated toilet seat or a handicapped toilet? Yes        04/20/2022    9:13 AM 01/06/2021    4:17 PM  6CIT Screen  What Year? 0 points 4 points  What month? 0 points 0 points  What time? 0 points 0 points  Count back from 20 0 points 4 points  Months in reverse 0 points 4 points  Repeat phrase 0 points 4 points  Total Score 0 points 16 points    Immunizations Immunization History  Administered Date(s) Administered   Fluad Quad(high Dose 65+) 04/03/2022   Influenza, High Dose Seasonal PF 04/03/2017, 04/28/2018, 03/15/2019   Influenza-Unspecified 12/13/2014   Pneumococcal Polysaccharide-23 07/12/2015    TDAP status: Due, Education has been provided regarding the  importance of this vaccine. Advised may receive this vaccine at local pharmacy or Health Dept. Aware to provide a copy of the vaccination record if obtained from local pharmacy or Health Dept. Verbalized acceptance and understanding.  Flu Vaccine status: Due, Education has been provided regarding the importance of this vaccine. Advised may receive this vaccine at local pharmacy or Health Dept. Aware to provide a copy of the vaccination record if obtained from local pharmacy or Health Dept. Verbalized acceptance and understanding.  Pneumococcal vaccine status: Up to date  Covid-19 vaccine status: Completed vaccines  Qualifies for Shingles Vaccine? Yes   Zostavax completed No   Shingrix Completed?: No.    Education has been provided regarding the importance of this vaccine. Patient has been advised to call insurance company to determine out of pocket expense if they have not yet received this vaccine. Advised may also receive vaccine at local pharmacy or Health Dept. Verbalized acceptance and understanding.  Screening Tests Health Maintenance  Topic Date Due   COVID-19 Vaccine (1) Never done   DTaP/Tdap/Td (1 - Tdap) Never done   Zoster Vaccines- Shingrix (1 of 2) Never done   Pneumonia Vaccine 65+ Years old (2 - PCV) 09/12/2022 (Originally 07/11/2016)   Medicare Annual Wellness (AWV)  04/21/2023   INFLUENZA VACCINE  Completed   DEXA SCAN  Completed   HPV VACCINES  Aged Out    Health Maintenance  Health Maintenance Due  Topic Date Due   COVID-19 Vaccine (1) Never done   DTaP/Tdap/Td (1 - Tdap) Never done   Zoster Vaccines- Shingrix (1 of 2) Never done    Colorectal cancer screening: No longer required.   Mammogram status: No longer required due to age.  Bone Density status: Completed 09/26/2021. Results reflect: Bone density results: OSTEOPENIA. Repeat every 5 years.  Lung Cancer Screening: (Low Dose CT Chest recommended if Age 66-80 years, 30 pack-year currently smoking OR have  quit w/in 15years.) does not qualify.   Lung Cancer Screening Referral: n/a  Additional Screening:  Hepatitis C Screening: does not qualify;  Vision Screening: Recommended annual ophthalmology exams for early detection of glaucoma and other disorders of the eye. Is the patient up to date with their annual eye exam?  Yes  Who is the provider or what is the name of the office in which the patient attends annual eye exams? Dr.Johnson  If pt is not established with a provider, would they like to be referred to a provider to establish care? No .   Dental Screening: Recommended annual dental exams for proper  oral hygiene  Community Resource Referral / Chronic Care Management: CRR required this visit?  No   CCM required this visit?  No      Plan:     I have personally reviewed and noted the following in the patient's chart:   Medical and social history Use of alcohol, tobacco or illicit drugs  Current medications and supplements including opioid prescriptions. Patient is not currently taking opioid prescriptions. Functional ability and status Nutritional status Physical activity Advanced directives List of other physicians Hospitalizations, surgeries, and ER visits in previous 12 months Vitals Screenings to include cognitive, depression, and falls Referrals and appointments  In addition, I have reviewed and discussed with patient certain preventive protocols, quality metrics, and best practice recommendations. A written personalized care plan for preventive services as well as general preventive health recommendations were provided to patient.     Daphane Shepherd, LPN   [id number]   Nurse Notes: Patient requesting home health services will discuss PCP

## 2022-05-01 ENCOUNTER — Encounter: Payer: Self-pay | Admitting: *Deleted

## 2022-05-01 ENCOUNTER — Ambulatory Visit: Payer: Self-pay | Admitting: *Deleted

## 2022-05-01 NOTE — Patient Outreach (Signed)
  Care Coordination   Follow Up Visit Note   05/01/2022  Name: LACARA DUNSWORTH MRN: [id number] DOB: 01-19-1936  Jolinda Pinkstaff Bielicki is a 86 y.o. year old female who sees Stacks, Cletus Gash, MD for primary care. I spoke with [name] [place] by phone today.  What matters to the patients health and wellness today?  Assist with Obtaining [place].    Goals Addressed               This Visit's Progress     Assist with Obtaining Personal Care Services. (pt-stated)   On track     Care Coordination Interventions:  Task-Centered Interventions Employed. Solution-Focused Strategies Activated. Problem-Solving Activities Initiated. Active Listening/Reflection Utilized.  Verbalization of Feelings Encouraged. Emotional Support Provided. Feelings of Frustration Validated, While Discussing Concerns Related to Ineligibility for [facility], through [facility] [phone]). CSW Collaboration with Primary Care Provider, Dr. Claretta Fraise, to Discuss Having You Re-Apply for [place], Under New Diagnoses. ~ Keep Follow-Up Appointment with Primary Care Provider, Dr. Claretta Fraise, Scheduled on 06/04/2022 at 10:55 AM, at [place], for Re-Evaluation and Completion of [place] Application, and Submission to [facility] [phone]), for Processing. Reviewed List of "Handy Man" Services, Agencies and Resources Provided, to [employer] and Assist with Referral Process.           SDOH assessments and interventions completed:  Yes.  Care Coordination Interventions:  Yes, provided.   Follow up plan: Follow up call scheduled for 06/05/2022 at 9:00 am.  Encounter Outcome:  Pt. Visit Completed.   Nat Christen, BSW, MSW, LCSW  Licensed Education officer, environmental Health System  Mailing [place] [address], [place], [place] [address] Physical  Address[address], [place], [place] [address] Toll Free Main # [phone] Fax # [phone] Cell # [phone] Di [email or web address]

## 2022-05-01 NOTE — Patient Instructions (Signed)
Visit Information  Thank you for taking time to visit with me today. Please don't hesitate to contact me if I can be of assistance to you.   Following are the goals we discussed today:   Goals Addressed               This Visit's Progress     Assist with Obtaining Personal Care Services. (pt-stated)   On track     Care Coordination Interventions:  Task-Centered Interventions Employed. Solution-Focused Strategies Activated. Problem-Solving Activities Initiated. Active Listening/Reflection Utilized.  Verbalization of Feelings Encouraged. Emotional Support Provided. Feelings of Frustration Validated, While Discussing Concerns Related to Ineligibility for [facility], through [facility] [phone]). CSW Collaboration with Primary Care Provider, Dr. Claretta Fraise, to Discuss Having You Re-Apply for [place], Under New Diagnoses. ~ Keep Follow-Up Appointment with Primary Care Provider, Dr. Claretta Fraise, Scheduled on 06/04/2022 at 10:55 AM, at [place], for Re-Evaluation and Completion of [place] Application, and Submission to [facility] [phone]), for Processing. Reviewed List of "Handy Man" Services, Agencies and Resources Provided, to [employer] and Assist with Referral Process.           Our next appointment is by telephone on 06/05/2022 at 9:00 am.  Please call the care guide team at [phone] if you need to cancel or reschedule your appointment.   If you are experiencing a Mental Health or [place] or need someone to talk to, please call the Suicide and Crisis Lifeline: 988 call the [place] National Suicide Prevention Lifeline: 516-573-3534 or TTY: (463)609-5673 TTY 709-653-8215) to talk to a trained counselor call 1-800-273-TALK (toll free, 24 hour hotline) go to [facility] [address], [place]  [phone]) call the [place]: [phone] call 911  Patient verbalizes understanding of instructions and care plan provided today and agrees to view in [place]. Active MyChart status and patient understanding of how to access instructions and care plan via MyChart confirmed with patient.     Telephone follow up appointment with care management team member scheduled for:  06/05/2022 at 9:00 am.  Nat Christen, BSW, MSW, [place]  Licensed Clinical Social Worker  [place]  Mailing [place]. [address] [place], [place] [address] Physical Address-[address] [place], [place] [address] Toll Free Main # [phone] Fax # [phone] Cell # [phone] [email or web address]

## 2022-05-16 ENCOUNTER — Encounter: Payer: Medicare Other | Admitting: *Deleted

## 2022-05-21 ENCOUNTER — Other Ambulatory Visit: Payer: Self-pay | Admitting: Cardiovascular Disease

## 2022-06-04 ENCOUNTER — Ambulatory Visit: Payer: Medicare Other | Admitting: Family Medicine

## 2022-06-05 ENCOUNTER — Ambulatory Visit: Payer: Self-pay | Admitting: *Deleted

## 2022-06-05 ENCOUNTER — Encounter: Payer: Self-pay | Admitting: *Deleted

## 2022-06-05 NOTE — Patient Outreach (Signed)
  Care Coordination   Follow Up Visit Note   06/05/2022  Name: [name] MRN: [id number] DOB: 08-03-1935  [name] is a 87 y.o. year old female who sees Stacks, Cletus Gash, MD for primary care. I spoke with [name] [place] by phone today.  What matters to the patients health and wellness today?  Assist with Obtaining [place].    Goals Addressed               This Visit's Progress     COMPLETED: Assist with Obtaining Personal Care Services. (pt-stated)   On track     Care Coordination Interventions:  Active Listening & Reflection Utilized.  Verbalization of Feelings Encouraged. Emotional Support Provided. Caregiver Stress Acknowledged. Caregiver Resources Reviewed. Task-Centered Interventions Implemented. Solution-Focused Strategies Employed. Problem-Solving Activities Activated. Please Keep Follow-Up Appointment with Primary Care Provider, Dr. Claretta Fraise with [place] [phone]), Scheduled on 06/18/2022 at 1:55 PM, for Re-Evaluation & Completion of Personal Care Services Application & Submission to [facility] [phone]), for Processing. Reinforced & Thoroughly Reviewed Transportation Benefits through [employer] (# [phone]) & Medicaid Transportation [phone]).           SDOH assessments and interventions completed:  Yes.  Care Coordination Interventions:  Yes, provided.   Follow up plan: No further intervention required.   Encounter Outcome:  Pt. Visit Completed.   Nat Christen, BSW, MSW, LCSW  Licensed Education officer, environmental Health System  Mailing [place] [address], [place], [place] [address] Physical Address-[address] [place], [place] [address] Toll Free Main # [phone] Fax # [phone] Cell # [phone] Di [email or web address]

## 2022-06-05 NOTE — Patient Instructions (Signed)
Visit Information  Thank you for taking time to visit with me today. Please don't hesitate to contact me if I can be of assistance to you.   Following are the goals we discussed today:   Goals Addressed               This Visit's Progress     COMPLETED: Assist with Obtaining Personal Care Services. (pt-stated)   On track     Care Coordination Interventions:  Active Listening & Reflection Utilized.  Verbalization of Feelings Encouraged. Emotional Support Provided. Caregiver Stress Acknowledged. Caregiver Resources Reviewed. Task-Centered Interventions Implemented. Solution-Focused Strategies Employed. Problem-Solving Activities Activated. Please Keep Follow-Up Appointment with Primary Care Provider, Dr. Claretta Fraise with [place] [phone]), Scheduled on 06/18/2022 at 1:55 PM, for Re-Evaluation & Completion of Personal Care Services Application & Submission to [facility] [phone]), for Processing. Reinforced & Thoroughly Reviewed Transportation Benefits through [employer] (# [phone]) & Medicaid Transportation [phone]).           Please call the care guide team at [phone] if you need to cancel or reschedule your appointment.   If you are experiencing a Mental Health or [place] or need someone to talk to, please call the Suicide and Crisis Lifeline: 988 call the [place] National Suicide Prevention Lifeline: 575-292-4460 or TTY: 906-553-7762 TTY (630)302-9952) to talk to a trained counselor call 1-800-273-TALK (toll free, 24 hour hotline) go to [facility] [address] [place] [phone]) call the [place]: [phone] call 911  Patient verbalizes understanding of instructions and care plan provided today and agrees to view in [place]. Active MyChart status and patient  understanding of how to access instructions and care plan via MyChart confirmed with patient.     No further follow up required.   Nat Christen, BSW, MSW, LCSW  Licensed Education officer, environmental [facility]  Mailing [place] [address] [place], [place] [address] Physical Address[address], [place], [place] [address] Toll Free Main # [phone] Fax # [phone] Cell # [phone] [email or web address]

## 2022-06-12 ENCOUNTER — Telehealth: Payer: Self-pay | Admitting: Cardiovascular Disease

## 2022-06-12 MED ORDER — CARVEDILOL 6.25 MG PO TABS: 6.2500 mg | ORAL_TABLET | Freq: Two times a day (BID) | ORAL | 6 refills | Status: DC

## 2022-06-12 NOTE — Telephone Encounter (Signed)
*  STAT* If patient is at the pharmacy, call can be transferred to refill team.   1. Which medications need to be refilled? (please list name of each medication and dose if known) carvedilol (COREG) 6.25 MG tablet   2. Which pharmacy/location (including street and city if local pharmacy) is medication to be sent to? [place], [address]   3. Do they need a 30 day or 90 day supply? 90 day

## 2022-06-12 NOTE — Telephone Encounter (Signed)
Refill complete 

## 2022-06-18 ENCOUNTER — Encounter: Payer: Self-pay | Admitting: Family Medicine

## 2022-06-18 ENCOUNTER — Ambulatory Visit (INDEPENDENT_AMBULATORY_CARE_PROVIDER_SITE_OTHER): Payer: 59 | Admitting: Family Medicine

## 2022-06-18 VITALS — BP 130/64 | HR 73 | Temp 97.6°F | Ht 62.0 in | Wt 169.4 lb

## 2022-06-18 DIAGNOSIS — E7800 Pure hypercholesterolemia, unspecified: Secondary | ICD-10-CM | POA: Diagnosis not present

## 2022-06-18 DIAGNOSIS — R7303 Prediabetes: Secondary | ICD-10-CM | POA: Diagnosis not present

## 2022-06-18 DIAGNOSIS — F419 Anxiety disorder, unspecified: Secondary | ICD-10-CM | POA: Diagnosis not present

## 2022-06-18 DIAGNOSIS — I10 Essential (primary) hypertension: Secondary | ICD-10-CM | POA: Diagnosis not present

## 2022-06-18 DIAGNOSIS — M7502 Adhesive capsulitis of left shoulder: Secondary | ICD-10-CM

## 2022-06-18 DIAGNOSIS — M0609 Rheumatoid arthritis without rheumatoid factor, multiple sites: Secondary | ICD-10-CM

## 2022-06-18 LAB — BAYER DCA HB A1C WAIVED: HB A1C (BAYER DCA - WAIVED): 5.9 % — ABNORMAL HIGH (ref 4.8–5.6)

## 2022-06-18 MED ORDER — QUETIAPINE FUMARATE 25 MG PO TABS: 25.0000 mg | ORAL_TABLET | Freq: Every day | ORAL | 1 refills | Status: DC

## 2022-06-18 MED ORDER — CLORAZEPATE DIPOTASSIUM 7.5 MG PO TABS: 7.5000 mg | ORAL_TABLET | Freq: Every day | ORAL | 2 refills | Status: DC | PRN

## 2022-06-18 MED ORDER — TRAMADOL HCL 50 MG PO TABS: 50.0000 mg | ORAL_TABLET | Freq: Two times a day (BID) | ORAL | 2 refills | Status: DC | PRN

## 2022-06-18 MED ORDER — ATORVASTATIN CALCIUM 40 MG PO TABS: 40.0000 mg | ORAL_TABLET | Freq: Every evening | ORAL | 3 refills | Status: DC

## 2022-06-18 NOTE — Progress Notes (Signed)
Subjective:  Patient ID: [name], female    DOB: 23-Oct-1935  Age: 87 y.o. MRN: [id number]  CC: Medical Management of Chronic Issues   HPI [name] presents for recheck of chronic pain. Also has anxiety - see below.     06/18/2022    2:07 PM 06/18/2022    1:53 PM 04/20/2022    9:09 AM  Depression screen PHQ 2/9  Decreased Interest 1 0 0  Down, Depressed, Hopeless 1 0 0  PHQ - 2 Score 2 0 0  Altered sleeping 2  0  Tired, decreased energy 1  0  Change in appetite 1  0  Feeling bad or failure about yourself  1  0  Trouble concentrating 2  0  Moving slowly or fidgety/restless 2  0  Suicidal thoughts 0  0  PHQ-9 Score 11  0  Difficult doing work/chores Somewhat difficult  Not difficult at all      06/18/2022    2:31 PM 06/18/2022    2:08 PM 04/03/2022   11:15 AM 10/26/2021    2:12 PM  GAD 7 : Generalized Anxiety Score  Nervous, Anxious, on Edge '2 1 3 1  '$ Control/stop worrying 2 0 2 1  Worry too much - different things 2 0 2 0  Trouble relaxing 2 0 2 1  Restless 1 0 2 1  Easily annoyed or irritable 2 0 1 0  Afraid - awful might happen 2 0 3 1  Total GAD 7 Score '13 1 15 5  '$ Anxiety Difficulty Very difficult  Very difficult Somewhat difficult     History [name] has a past medical history of Acute on chronic respiratory failure with hypoxia ([place]) (04/24/2018), Anxiety, Atypical chest pain, Bronchospasm, CAD (coronary artery disease), Cervical disc disorder with myelopathy, unspecified cervical region, Cervicalgia (01/19/2009), Chest pain at rest (3/53/6144), Chronic systolic (congestive) heart failure ([place]), COPD (chronic obstructive pulmonary disease) ([place]), Coronary artery disease, De Quervain's disease (tenosynovitis) (04/02/2011), Disc disease with myelopathy, cervical, Essential hypertension, Hemorrhoids, Liver mass, Lung, cysts, congenital, Myocardial infarction ([place]), Nephrolithiasis, Nonischemic cardiomyopathy ([place]), On home O2, Osteoarthritis, Sprain of wrist  (08/07/2012), and Thoracic ascending aortic aneurysm ([place]).   She has a past surgical history that includes Tonsillectomy and adenoidectomy; Complete hysterectomy; Benign breast tumors; Cholecystectomy; Colonoscopy; Colonoscopy (N/A, 09/22/2014); LEFT HEART CATH AND CORONARY ANGIOGRAPHY (N/A, 11/23/2016); and CORONARY STENT INTERVENTION (N/A, 11/23/2016).   Her family history includes Aneurysm in her father; Diabetes in her brother; Heart disease in her brother, mother, and sister; Lung cancer in her brother.She reports that she quit smoking about 14 years ago. Her smoking use included cigarettes. She started smoking about 45 years ago. She has a 0.75 pack-year smoking history. She has never used smokeless tobacco. She reports that she does not drink alcohol and does not use drugs.    ROS Review of Systems  Constitutional: Negative.   HENT: Negative.    Eyes:  Negative for visual disturbance.  Respiratory:  Negative for shortness of breath.   Cardiovascular:  Negative for chest pain.  Gastrointestinal:  Negative for abdominal pain.  Musculoskeletal:  Negative for arthralgias.    Objective:  BP 130/64   Pulse 73   Temp 97.6 F (36.4 C)   Ht '5\' 2"'$  (1.575 m)   Wt 169 lb 6.4 oz (76.8 kg)   SpO2 94%   BMI 30.98 kg/m   BP Readings from Last 3 Encounters:  06/18/22 130/64  04/03/22 (!) 159/77  03/19/22 (!) 140/62  Wt Readings from Last 3 Encounters:  06/18/22 169 lb 6.4 oz (76.8 kg)  04/20/22 174 lb (78.9 kg)  04/03/22 174 lb 6.4 oz (79.1 kg)     Physical Exam Constitutional:      General: She is not in acute distress.    Appearance: She is well-developed.  Cardiovascular:     Rate and Rhythm: Normal rate and regular rhythm.  Pulmonary:     Breath sounds: Normal breath sounds.  Musculoskeletal:        General: Normal range of motion.  Skin:    General: Skin is warm and dry.  Neurological:     Mental Status: She is alert and oriented to person, place, and time.        Assessment & Plan:   [name] was seen today for medical management of chronic issues.  Diagnoses and all orders for this visit:  Essential hypertension -     CBC with Differential/Platelet -     CMP14+EGFR  Prediabetes -     Bayer DCA Hb A1c Waived  Pure hypercholesterolemia -     Lipid panel  Anxiety -     clorazepate (TRANXENE) 7.5 MG tablet; Take 1 tablet (7.5 mg total) by mouth daily as needed for anxiety. -     QUEtiapine (SEROQUEL) 25 MG tablet; Take 1 tablet (25 mg total) by mouth at bedtime.  Adhesive capsulitis of left shoulder -     traMADol (ULTRAM) 50 MG tablet; Take 1 tablet (50 mg total) by mouth every 12 (twelve) hours as needed for moderate pain.  Rheumatoid arthritis of multiple sites without rheumatoid factor (HCC) -     traMADol (ULTRAM) 50 MG tablet; Take 1 tablet (50 mg total) by mouth every 12 (twelve) hours as needed for moderate pain.  Other orders -     atorvastatin (LIPITOR) 40 MG tablet; Take 1 tablet (40 mg total) by mouth every evening.       I am having [name]. [name] maintain her aspirin EC, OXYGEN, vitamin C, Cranberry, alendronate, Vitamin D, Trelegy Ellipta, Ventolin HFA, leflunomide, isosorbide mononitrate, Entresto, celecoxib, triamterene-hydrochlorothiazide, meclizine, carvedilol, atorvastatin, clorazepate, QUEtiapine, and traMADol.  Allergies as of 06/18/2022       Reactions   Plavix [clopidogrel Bisulfate] Itching   Severe itching   Calcium Channel Blockers Other (See Comments)   cramping   Alprazolam Nausea And Vomiting   Codeine Nausea And Vomiting   Percodan [oxycodone-aspirin] Nausea And Vomiting   Valium Nausea And Vomiting        Medication List        Accurate as of June 18, 2022  2:32 PM. If you have any questions, ask your nurse or doctor.          alendronate 70 MG tablet Commonly known as: FOSAMAX Take 1 tablet (70 mg total) by mouth every 7 (seven) days. Take with a full glass of water on an  empty stomach. Do not lay down for at least 2 hours   aspirin EC 81 MG tablet Take 81 mg by mouth every morning.   atorvastatin 40 MG tablet Commonly known as: LIPITOR Take 1 tablet (40 mg total) by mouth every evening.   carvedilol 6.25 MG tablet Commonly known as: COREG Take 1 tablet (6.25 mg total) by mouth 2 (two) times daily with a meal.   celecoxib 200 MG capsule Commonly known as: CELEBREX Take 200 mg by mouth 2 (two) times daily.   clorazepate 7.5 MG tablet Commonly known as: TRANXENE  Take 1 tablet (7.5 mg total) by mouth daily as needed for anxiety.   Cranberry 500 MG Tabs Take 1 tablet by mouth daily.   Entresto 24-26 MG Generic drug: sacubitril-valsartan Take 1 tablet by mouth 2 (two) times daily.   isosorbide mononitrate 30 MG 24 hr tablet Commonly known as: IMDUR TAKE (1/2) TABLET BY MOUTH DAILY. What changed: See the new instructions.   leflunomide 20 MG tablet Commonly known as: ARAVA Take 1 tablet (20 mg total) by mouth daily.   meclizine 25 MG tablet Commonly known as: ANTIVERT TAKE 1 TABLET BY MOUTH DAILY AS NEEDED FOR DIZZINESS.   OXYGEN Inhale 2 L into the lungs at bedtime. Can use in the morning as needed   QUEtiapine 25 MG tablet Commonly known as: SEROQUEL Take 1 tablet (25 mg total) by mouth at bedtime.   traMADol 50 MG tablet Commonly known as: ULTRAM Take 1 tablet (50 mg total) by mouth every 12 (twelve) hours as needed for moderate pain.   Trelegy Ellipta 100-62.5-25 MCG/ACT Aepb Generic drug: Fluticasone-Umeclidin-Vilant INHALE (1) PUFF INTO THE LUNGS DAILY What changed: See the new instructions.   triamterene-hydrochlorothiazide 37.5-25 MG tablet Commonly known as: MAXZIDE-25 Take 1 tablet by mouth daily. For blood pressure and fluid   Ventolin HFA 108 (90 Base) MCG/ACT inhaler Generic drug: albuterol INHALE 2 PUFFS INTO THE LUNGS EVERY 4 HOURS AS NEEDED. What changed: See the new instructions.   vitamin C 100 MG  tablet Take 100 mg by mouth daily.   Vitamin D 50 MCG (2000 UT) tablet Take 1 tablet (2,000 Units total) by mouth daily.         Follow-up: Return in about 3 months (around 09/16/2022).  Claretta Fraise, M.D.

## 2022-06-19 LAB — CMP14+EGFR
ALT: 8 IU/L (ref 0–32)
AST: 16 IU/L (ref 0–40)
Albumin/Globulin Ratio: 2.2 (ref 1.2–2.2)
Albumin: 4.3 g/dL (ref 3.7–4.7)
Alkaline Phosphatase: 51 IU/L (ref 44–121)
BUN/Creatinine Ratio: 20 (ref 12–28)
BUN: 14 mg/dL (ref 8–27)
Bilirubin Total: 0.4 mg/dL (ref 0.0–1.2)
CO2: 26 mmol/L (ref 20–29)
Calcium: 10.3 mg/dL (ref 8.7–10.3)
Chloride: 106 mmol/L (ref 96–106)
Creatinine, Ser: 0.7 mg/dL (ref 0.57–1.00)
Globulin, Total: 2 g/dL (ref 1.5–4.5)
Glucose: 125 mg/dL — ABNORMAL HIGH (ref 70–99)
Potassium: 4.3 mmol/L (ref 3.5–5.2)
Sodium: 146 mmol/L — ABNORMAL HIGH (ref 134–144)
Total Protein: 6.3 g/dL (ref 6.0–8.5)
eGFR: 85 mL/min/{1.73_m2} (ref >59)

## 2022-06-19 LAB — CBC WITH DIFFERENTIAL/PLATELET
Basophils Absolute: 0 10*3/uL (ref 0.0–0.2)
Basos: 1 %
EOS (ABSOLUTE): 0.3 10*3/uL (ref 0.0–0.4)
Eos: 5 %
Hematocrit: 38.9 % (ref 34.0–46.6)
Hemoglobin: 12.4 g/dL (ref 11.1–15.9)
Immature Grans (Abs): 0 10*3/uL (ref 0.0–0.1)
Immature Granulocytes: 0 %
Lymphocytes Absolute: 2.6 10*3/uL (ref 0.7–3.1)
Lymphs: 40 %
MCH: 26.7 pg (ref 26.6–33.0)
MCHC: 31.9 g/dL (ref 31.5–35.7)
MCV: 84 fL (ref 79–97)
Monocytes Absolute: 0.8 10*3/uL (ref 0.1–0.9)
Monocytes: 12 %
Neutrophils Absolute: 2.7 10*3/uL (ref 1.4–7.0)
Neutrophils: 42 %
Platelets: 307 10*3/uL (ref 150–450)
RBC: 4.65 x10E6/uL (ref 3.77–5.28)
RDW: 14.8 % (ref 11.7–15.4)
WBC: 6.5 10*3/uL (ref 3.4–10.8)

## 2022-06-19 LAB — LIPID PANEL
Chol/HDL Ratio: 3.1 ratio (ref 0.0–4.4)
Cholesterol, Total: 245 mg/dL — ABNORMAL HIGH (ref 100–199)
HDL: 79 mg/dL (ref >39)
LDL Chol Calc (NIH): 153 mg/dL — ABNORMAL HIGH (ref 0–99)
Triglycerides: 77 mg/dL (ref 0–149)
VLDL Cholesterol Cal: 13 mg/dL (ref 5–40)

## 2022-07-30 ENCOUNTER — Telehealth: Payer: Self-pay | Admitting: Cardiovascular Disease

## 2022-07-30 MED ORDER — CARVEDILOL 3.125 MG PO TABS: 3.1250 mg | ORAL_TABLET | Freq: Two times a day (BID) | ORAL | 11 refills | Status: DC

## 2022-07-30 NOTE — Telephone Encounter (Signed)
STAT if HR is under 50 or over 120 (normal HR is 60-100 beats per minute)  What is your heart rate? 65 right now  Do you have a log of your heart rate readings (document readings)? 22 yesterday - doesn't have a log other than this  Do you have any other symptoms? Pt states "things got dark on me". States it doesn't feel good, things get blurry at times. This has been happening for a while. When she checked her HR yesterday, she took 4 asprin and tried to calm down.

## 2022-07-30 NOTE — Telephone Encounter (Signed)
Pt states that her heart rate has been dropping. She states that she didn't "feel right". She states that things started to get dark. Pt states that she then got up and took four 81 mg ASA. She then sat there and began to feel better. Pt denies passing out, increased SOB. Pt reports that this has happened 4 times since her last visit. Current heart rate according to pulse ox is 79. Please advise.

## 2022-07-31 NOTE — Telephone Encounter (Signed)
Pt notified and verbalized understanding.

## 2022-07-31 NOTE — Progress Notes (Unsigned)
Cardiology Office Note:    Date:  08/01/2022  ID:  [name], DOB [birth year], MRN [id number]  History of Present Illness:    [name] is a 87 y.o. female with past medical history of CAD (s/p DES to mid-LAD and DES to mid-OM1 in 11/2016, mild peri-infarct ischemia by NST in 07/2021 and medical management pursued), HFrEF (EF 40-45% in 2018, at 45% by repeat echo in 12/2018 and 35-40% in 05/2021 and 01/2022), HTN, HLD, RA, dilation of ascending aorta (at 4.2 cm by imaging in 2019 and 4.3 cm in 01/2022) and COPD who presents to the office today for follow-up.   She was last examined by Dr. Johnsie Cancel in 03/2022 and denied any recent anginal symptoms at that time. She was continued on her current cardiac medications including ASA 81 mg daily, Atorvastatin 40 mg daily, Coreg 6.25 mg twice daily, Entresto 24-26 mg twice daily and Imdur 15 mg daily.  She called the office earlier this week reporting episodes of bradycardia and "not feeling right". Dr. Johnsie Cancel recommended reducing Coreg to 3.125 mg twice daily and scheduling a follow-up visit for a likely monitor.   In talking with the patient today, she reports having an episode of near syncope earlier this week and says her heart rate had been in 30's around that time when checked. She denies any actual loss of consciousness but says she felt weak throughout the day. She reports at least 2-3 episodes within the past year prior to this. Reports her breathing has overall been stable. No specific orthopnea, PND or pitting edema. She does experience intermittent chest discomfort but is unsure if this is due to her CAD or musculoskeletal pain given her rotator cuff issues along her left shoulder. Says this has overall been stable and has not increased in frequency or severity. She has not had to utilize nitroglycerin.  Studies Reviewed:    EKG:  EKG is ordered today and personally reviewed. EKG shows NSR, HR 84 with PVC's and known LBBB.   NST: 07/2021     Findings are consistent with prior inferior myocardial infarction. Evidence of prior apical myocardial infarction with mild peri-infarct ischemia. The study is intermediate risk. Risk based primarily on decreased LVEF, there is fairly mild current ischemia.   No ST deviation was noted.   LV perfusion is abnormal.   Left ventricular function is abnormal. Nuclear stress EF: 36 %. The left ventricular ejection fraction is moderately decreased (30-44%).  Echocardiogram: 01/2022 IMPRESSIONS     1. Left ventricular ejection fraction, by estimation, is 35 to 40%. The  left ventricle has moderately decreased function. The left ventricle  demonstrates global hypokinesis. There is moderate left ventricular  hypertrophy. Left ventricular diastolic  parameters are consistent with Grade I diastolic dysfunction (impaired  relaxation).   2. Right ventricular systolic function is normal. The right ventricular  size is normal. Tricuspid regurgitation signal is inadequate for assessing  PA pressure.   3. Left atrial size was mildly dilated.   4. Moderate pericardial effusion. The pericardial effusion is anterior to  the right ventricle.   5. The mitral valve is grossly normal. Mild mitral valve regurgitation.   6. The aortic valve was not well visualized. Aortic valve regurgitation  is mild. Aortic valve sclerosis/calcification is present, without any  evidence of aortic stenosis. Aortic valve mean gradient measures 5.0 mmHg.   7. The inferior vena cava is dilated in size with >50% respiratory  variability, suggesting right atrial pressure of 8 mmHg.  Comparison(s): Prior images reviewed side by side. LVEF has decreased  somewhat in comparison. Anterior pericardial effusion present on prior  stufy as well, somewhat larger but without obvious hemodynamic  significance.    Physical Exam:   VS:  BP (!) 120/56   Pulse 84   Ht 5\' 2"  (1.575 m)   Wt 170 lb 9.6 oz (77.4 kg)   SpO2 97%   BMI 31.20  kg/m    Wt Readings from Last 3 Encounters:  08/01/22 170 lb 9.6 oz (77.4 kg)  06/18/22 169 lb 6.4 oz (76.8 kg)  04/20/22 174 lb (78.9 kg)     GEN: Pleasant, elderly female appearing in no acute distress NECK: No JVD; No carotid bruits CARDIAC: RRR, no murmurs, rubs, gallops RESPIRATORY:  Clear to auscultation without rales, wheezing or rhonchi  ABDOMEN: Does not appear distended. No obvious abdominal masses.  EXTREMITIES:  No pitting edema; No deformity   ASSESSMENT AND PLAN:     1. Presyncope/Bradycardia - Given her recent episode of presyncope with associated bradycardia at that time with self-reported heart rate in the 30's, will plan to obtain a 2-week Zio patch to rule out significant arrhythmias. Coreg was already reduced from 6.25 mg twice daily to 3.125 mg twice daily earlier this week and she does have a new Rx for this.  2. CAD - She is s/p DES to mid-LAD and DES to mid-OM1 in 11/2016 and NST in 07/2021 showed mild peri-infarct ischemia with medical management pursued. Her most recent chest pain seems atypical for a cardiac etiology as it is mostly isolated to her left shoulder and reproducible with positional changes. Continue current medical therapy for now with ASA 81 mg daily, Atorvastatin 40 mg daily, Coreg 3.125 mg twice daily and Imdur 15 mg daily.  3. HFrEF - Her ejection fraction was at 35 to 40% by most recent imaging. She reports her respiratory status has been stable and she appears euvolemic on examination today. Continue current medical therapy with Coreg 3.125 mg twice daily, Entresto 24-26 mg twice daily and Imdur 15 mg daily. Would not use an SGLT2 inhibitor given her history of frequent UTI's. Could consider switching HCTZ to Spironolactone in the future but she wishes to avoid additional medication changes at this time given the recent adjustment of Coreg.   4. Dilation of Ascending Aorta - At 4.3 cm by CTA in 01/2022 with annual imaging recommended. Can  arrange at her next visit if not performed in the interim.   5. HLD - FLP in 06/2022 showed total cholesterol at 245, HDL 79, Triglycerides 77 and LDL 153. She reports she had been without Atorvastatin at that time and she was recently restarted on Atorvastatin 40 mg daily. Would plan to recheck an FLP at her next visit if not checked by her PCP in the interim following compliance with this.  Signed, Erma Heritage, PA-C

## 2022-08-01 ENCOUNTER — Encounter: Payer: Self-pay | Admitting: Student

## 2022-08-01 ENCOUNTER — Ambulatory Visit: Payer: 59 | Attending: Student

## 2022-08-01 ENCOUNTER — Ambulatory Visit: Payer: 59 | Attending: Student | Admitting: Student

## 2022-08-01 VITALS — BP 120/56 | HR 84 | Ht 62.0 in | Wt 170.6 lb

## 2022-08-01 DIAGNOSIS — I5020 Unspecified systolic (congestive) heart failure: Secondary | ICD-10-CM

## 2022-08-01 DIAGNOSIS — R55 Syncope and collapse: Secondary | ICD-10-CM | POA: Diagnosis not present

## 2022-08-01 DIAGNOSIS — I7121 Aneurysm of the ascending aorta, without rupture: Secondary | ICD-10-CM

## 2022-08-01 DIAGNOSIS — R001 Bradycardia, unspecified: Secondary | ICD-10-CM

## 2022-08-01 DIAGNOSIS — E785 Hyperlipidemia, unspecified: Secondary | ICD-10-CM

## 2022-08-01 DIAGNOSIS — I25118 Atherosclerotic heart disease of native coronary artery with other forms of angina pectoris: Secondary | ICD-10-CM | POA: Diagnosis not present

## 2022-08-01 NOTE — Patient Instructions (Addendum)
Medication Instructions:   Make sure to take your Atorvastatin at night.   *If you need a refill on your cardiac medications before your next appointment, please call your pharmacy*   Lab Work: NONE   If you have labs (blood work) drawn today and your tests are completely normal, you will receive your results only by: [place] (if you have MyChart) OR A paper copy in the mail If you have any lab test that is abnormal or we need to change your treatment, we will call you to review the results.   Testing/Procedures: [place] [place]- Long Term Monitor Instructions   Your physician has requested you wear your ZIO patch monitor__14___days. May remove on April 3.   This is a single patch monitor.  [employer] supplies one patch monitor per enrollment.  Additional stickers are not available.   Please do not apply patch if you will be having a Nuclear Stress Test, Echocardiogram, Cardiac CT, MRI, or Chest Xray during the time frame you would be wearing the monitor. The patch cannot be worn during these tests.  You cannot remove and re-apply the ZIO XT patch monitor.   Your ZIO patch monitor will be sent USPS Priority mail from [facility] directly to your home address. The monitor may also be mailed to a PO BOX if home delivery is not available.   It may take 3-5 days to receive your monitor after you have been enrolled.   Once you have received you monitor, please review enclosed instructions.  Your monitor has already been registered assigning a specific monitor serial # to you.   Applying the monitor   Shave hair from upper left chest.   Hold abrader disc by orange tab.  Rub abrader in 40 strokes over left upper chest as indicated in your monitor instructions.   Clean area with 4 enclosed alcohol pads .  Use all pads to assure are is cleaned thoroughly.  Let dry.   Apply patch as indicated in monitor instructions.  Patch will be place under collarbone on left side of chest with  arrow pointing upward.   Rub patch adhesive wings for 2 minutes.Remove white label marked "1".  Remove white label marked "2".  Rub patch adhesive wings for 2 additional minutes.   While looking in a mirror, press and release button in center of patch.  A small green light will flash 3-4 times .  This will be your only indicator the monitor has been turned on.     Do not shower for the first 24 hours.  You may shower after the first 24 hours.   Press button if you feel a symptom. You will hear a small click.  Record Date, Time and Symptom in the Patient Log Book.   When you are ready to remove patch, follow instructions on last 2 pages of Patient Log Book.  Stick patch monitor onto last page of Patient Log Book.   Place Patient Log Book in [place] box.  Use locking tab on box and tape box closed securely.  The [employer] and [employer] has [employer] on it.  Please place in mailbox as soon as possible.  Your physician should have your test results approximately 7 days after the monitor has been mailed back to [facility].   Call [place] at [phone] if you have questions regarding your ZIO XT patch monitor.  Call them immediately if you see an orange light blinking on your monitor.   If  your monitor falls off in less than 4 days contact our Monitor department at [phone].  If your monitor becomes loose or falls off after 4 days call [employer] at [phone] for suggestions on securing your monitor.     Follow-Up: At [facility], you and your health needs are our priority.  As part of our continuing mission to provide you with exceptional heart care, we have created designated Provider Care Teams.  These Care Teams include your primary Cardiologist (physician) and Advanced Practice Providers (APPs -  Physician Assistants and Nurse Practitioners) who all work together to provide you with the care you need, when you need it.  We recommend signing up for the  patient portal called "MyChart".  Sign up information is provided on this After Visit Summary.  MyChart is used to connect with patients for Virtual Visits (Telemedicine).  Patients are able to view lab/test results, encounter notes, upcoming appointments, etc.  Non-urgent messages can be sent to your provider as well.   To learn more about what you can do with MyChart, go to [email or web address].    Your next appointment:   3-4 month(s)  Provider:   You may see Jenkins Rouge, MD or one of the following Advanced Practice Providers on your designated Care Team:   Bernerd Pho, PA-C  Ermalinda Barrios, PA-C     Other Instructions Thank you for choosing [facility]!

## 2022-08-01 NOTE — Addendum Note (Signed)
Addended by: Levonne Hubert on: 08/01/2022 04:19 PM   Modules accepted: Orders

## 2022-09-17 ENCOUNTER — Ambulatory Visit: Payer: 59 | Admitting: Family Medicine

## 2022-09-19 ENCOUNTER — Encounter: Payer: Self-pay | Admitting: Family Medicine

## 2022-10-22 ENCOUNTER — Ambulatory Visit (INDEPENDENT_AMBULATORY_CARE_PROVIDER_SITE_OTHER): Payer: 59 | Admitting: Family Medicine

## 2022-10-22 ENCOUNTER — Encounter: Payer: Self-pay | Admitting: Family Medicine

## 2022-10-22 ENCOUNTER — Other Ambulatory Visit: Payer: Self-pay | Admitting: Family Medicine

## 2022-10-22 VITALS — BP 118/68 | HR 83 | Temp 97.8°F | Ht 62.0 in | Wt 163.4 lb

## 2022-10-22 DIAGNOSIS — R7303 Prediabetes: Secondary | ICD-10-CM

## 2022-10-22 DIAGNOSIS — M0609 Rheumatoid arthritis without rheumatoid factor, multiple sites: Secondary | ICD-10-CM

## 2022-10-22 DIAGNOSIS — F419 Anxiety disorder, unspecified: Secondary | ICD-10-CM

## 2022-10-22 DIAGNOSIS — M7502 Adhesive capsulitis of left shoulder: Secondary | ICD-10-CM

## 2022-10-22 DIAGNOSIS — E7800 Pure hypercholesterolemia, unspecified: Secondary | ICD-10-CM | POA: Diagnosis not present

## 2022-10-22 DIAGNOSIS — Z79899 Other long term (current) drug therapy: Secondary | ICD-10-CM

## 2022-10-22 DIAGNOSIS — I10 Essential (primary) hypertension: Secondary | ICD-10-CM

## 2022-10-22 LAB — CBC WITH DIFFERENTIAL/PLATELET
Hematocrit: 40.1 % (ref 34.0–46.6)
Immature Granulocytes: 0 %
Lymphocytes Absolute: 2.5 10*3/uL (ref 0.7–3.1)
Lymphs: 27 %
MCHC: 32.4 g/dL (ref 31.5–35.7)
Platelets: 357 10*3/uL (ref 150–450)
RDW: 14.9 % (ref 11.7–15.4)

## 2022-10-22 LAB — BAYER DCA HB A1C WAIVED: HB A1C (BAYER DCA - WAIVED): 5.9 % — ABNORMAL HIGH (ref 4.8–5.6)

## 2022-10-22 LAB — LIPID PANEL

## 2022-10-22 MED ORDER — LEFLUNOMIDE 20 MG PO TABS: 20.0000 mg | ORAL_TABLET | Freq: Every day | ORAL | 3 refills | Status: DC

## 2022-10-22 MED ORDER — QUETIAPINE FUMARATE 25 MG PO TABS: 25.0000 mg | ORAL_TABLET | Freq: Every day | ORAL | 1 refills | Status: DC

## 2022-10-22 MED ORDER — VITAMIN D 50 MCG (2000 UT) PO TABS: 2000.0000 [IU] | ORAL_TABLET | Freq: Every day | ORAL | 3 refills | Status: DC

## 2022-10-22 MED ORDER — TRAMADOL HCL 50 MG PO TABS: 50.0000 mg | ORAL_TABLET | Freq: Two times a day (BID) | ORAL | 5 refills | Status: DC | PRN

## 2022-10-22 MED ORDER — ALENDRONATE SODIUM 70 MG PO TABS: 70.0000 mg | ORAL_TABLET | ORAL | 11 refills | Status: DC

## 2022-10-22 MED ORDER — CLORAZEPATE DIPOTASSIUM 7.5 MG PO TABS: 7.5000 mg | ORAL_TABLET | Freq: Every day | ORAL | 5 refills | Status: DC | PRN

## 2022-10-22 NOTE — Progress Notes (Signed)
Subjective:  Patient ID: [name],  female    DOB: 1935/12/31  Age: 87 y.o.    CC: Medical Management of Chronic Issues   HPI [name] presents for  follow-up of hypertension. Patient has no history of headache chest pain or shortness of breath or recent cough. Patient also denies symptoms of TIA such as numbness weakness lateralizing. Patient denies side effects from medication. States taking it regularly.  Patient also  in for follow-up of elevated cholesterol. Doing well without complaints on current medication. Causing cramping not taking it every day. Not having myalgia and arthralgia and nausea. Also in today for liver function testing. Currently no chest pain, shortness of breath or other cardiovascular related symptoms noted.  Follow-up of diabetes.  Patient denies symptoms such as excessive hunger or urinary frequency, excessive hunger, nausea No significant hypoglycemic spells noted. Medications reviewed. Pt reports taking them regularly. Pt. denies complication/adverse reaction today.   PAIN IS BETTER taking tramadol when it flares. Requests to stretch appointments to once a year.     10/22/2022   10:28 AM 06/18/2022    2:31 PM 06/18/2022    2:08 PM 04/03/2022   11:15 AM  GAD 7 : Generalized Anxiety Score  Nervous, Anxious, on Edge 1 2 1 3   Control/stop worrying 0 2 0 2  Worry too much - different things 0 2 0 2  Trouble relaxing 1 2 0 2  Restless 0 1 0 2  Easily annoyed or irritable 0 2 0 1  Afraid - awful might happen 0 2 0 3  Total GAD 7 Score 2 13 1 15   Anxiety Difficulty Somewhat difficult Very difficult  Very difficult        History [name] has a past medical history of Acute on chronic respiratory failure with hypoxia (HCC) (04/24/2018), Anxiety, Atypical chest pain, Bronchospasm, CAD (coronary artery disease), Cervical disc disorder with myelopathy, unspecified cervical region, Cervicalgia (01/19/2009), Chest pain at rest (01/27/2015), Chronic  systolic (congestive) heart failure (HCC), COPD (chronic obstructive pulmonary disease) (HCC), Coronary artery disease, De Quervain's disease (tenosynovitis) (04/02/2011), Disc disease with myelopathy, cervical, Essential hypertension, Hemorrhoids, Liver mass, Lung, cysts, congenital, Myocardial infarction (HCC), Nephrolithiasis, Nonischemic cardiomyopathy (HCC), On home O2, Osteoarthritis, Sprain of wrist (08/07/2012), and Thoracic ascending aortic aneurysm (HCC).   She has a past surgical history that includes Tonsillectomy and adenoidectomy; Complete hysterectomy; Benign breast tumors; Cholecystectomy; Colonoscopy; Colonoscopy (N/A, 09/22/2014); LEFT HEART CATH AND CORONARY ANGIOGRAPHY (N/A, 11/23/2016); and CORONARY STENT INTERVENTION (N/A, 11/23/2016).   Her family history includes Aneurysm in her father; Diabetes in her brother; Heart disease in her brother, mother, and sister; Lung cancer in her brother.She reports that she quit smoking about 14 years ago. Her smoking use included cigarettes. She started smoking about 45 years ago. She has a 0.75 pack-year smoking history. She has never used smokeless tobacco. She reports that she does not drink alcohol and does not use drugs.  Current Outpatient Medications on File Prior to Visit  Medication Sig Dispense Refill   albuterol (VENTOLIN HFA) 108 (90 Base) MCG/ACT inhaler INHALE 2 PUFFS INTO THE LUNGS EVERY 4 HOURS AS NEEDED. (Patient taking differently: Inhale 2 puffs into the lungs every 4 (four) hours as needed for wheezing or shortness of breath.) 18 g 2   Ascorbic Acid (VITAMIN C) 100 MG tablet Take 100 mg by mouth daily.     aspirin EC 81 MG tablet Take 81 mg by mouth every morning.  atorvastatin (LIPITOR) 40 MG tablet Take 1 tablet (40 mg total) by mouth every evening. 90 tablet 3   carvedilol (COREG) 3.125 MG tablet Take 1 tablet (3.125 mg total) by mouth 2 (two) times daily with a meal. 60 tablet 11   celecoxib (CELEBREX) 200 MG capsule Take  200 mg by mouth 2 (two) times daily.     Cranberry 500 MG TABS Take 1 tablet by mouth daily.     ENTRESTO 24-26 MG Take 1 tablet by mouth 2 (two) times daily. 60 tablet 5   Fluticasone-Umeclidin-Vilant (TRELEGY ELLIPTA) 100-62.5-25 MCG/ACT AEPB INHALE (1) PUFF INTO THE LUNGS DAILY (Patient taking differently: Inhale 1 puff into the lungs daily.) 60 each 11   isosorbide mononitrate (IMDUR) 30 MG 24 hr tablet TAKE (1/2) TABLET BY MOUTH DAILY. (Patient taking differently: 15 mg daily.) 45 tablet 3   meclizine (ANTIVERT) 25 MG tablet TAKE 1 TABLET BY MOUTH DAILY AS NEEDED FOR DIZZINESS. 30 tablet 0   OXYGEN Inhale 2 L into the lungs at bedtime. Can use in the morning as needed     triamterene-hydrochlorothiazide (MAXZIDE-25) 37.5-25 MG tablet Take 1 tablet by mouth daily. For blood pressure and fluid 90 tablet 3   No current facility-administered medications on file prior to visit.    ROS Review of Systems  Constitutional: Negative.   HENT: Negative.    Eyes:  Negative for visual disturbance.  Respiratory:  Negative for shortness of breath.   Cardiovascular:  Negative for chest pain.  Gastrointestinal:  Negative for abdominal pain.  Musculoskeletal:  Negative for arthralgias.    Objective:  BP 118/68   Pulse 83   Temp 97.8 F (36.6 C)   Ht 5\' 2"  (1.575 m)   Wt 163 lb 6.4 oz (74.1 kg)   SpO2 96%   BMI 29.89 kg/m   BP Readings from Last 3 Encounters:  10/22/22 118/68  08/01/22 (!) 120/56  06/18/22 130/64    Wt Readings from Last 3 Encounters:  10/22/22 163 lb 6.4 oz (74.1 kg)  08/01/22 170 lb 9.6 oz (77.4 kg)  06/18/22 169 lb 6.4 oz (76.8 kg)     Physical Exam Constitutional:      General: She is not in acute distress.    Appearance: She is well-developed.  Cardiovascular:     Rate and Rhythm: Normal rate and regular rhythm.  Pulmonary:     Breath sounds: Normal breath sounds.  Musculoskeletal:        General: Normal range of motion.  Skin:    General: Skin is warm  and dry.  Neurological:     Mental Status: She is alert and oriented to person, place, and time.     Diabetic Foot Exam - Simple   No data filed     Lab Results  Component Value Date   HGBA1C 5.9 (H) 06/18/2022   HGBA1C 6.0 (H) 12/28/2021   HGBA1C 6.1 (H) 01/24/2021    Assessment & Plan:   [name] was seen today for medical management of chronic issues.  Diagnoses and all orders for this visit:  Prediabetes -     Bayer DCA Hb A1c Waived  Essential hypertension -     CBC with Differential/Platelet -     CMP14+EGFR  Pure hypercholesterolemia -     Lipid panel  Anxiety -     clorazepate (TRANXENE) 7.5 MG tablet; Take 1 tablet (7.5 mg total) by mouth daily as needed for anxiety. -     QUEtiapine (SEROQUEL) 25 MG  tablet; Take 1 tablet (25 mg total) by mouth at bedtime.  Adhesive capsulitis of left shoulder -     traMADol (ULTRAM) 50 MG tablet; Take 1 tablet (50 mg total) by mouth every 12 (twelve) hours as needed for moderate pain.  Rheumatoid arthritis of multiple sites without rheumatoid factor (HCC) -     traMADol (ULTRAM) 50 MG tablet; Take 1 tablet (50 mg total) by mouth every 12 (twelve) hours as needed for moderate pain.  Controlled substance agreement signed -     ToxASSURE Select 13 (MW), Urine  Other orders -     alendronate (FOSAMAX) 70 MG tablet; Take 1 tablet (70 mg total) by mouth every 7 (seven) days. Take with a full glass of water on an empty stomach. Do not lay down for at least 2 hours -     Cholecalciferol (VITAMIN D) 50 MCG (2000 UT) tablet; Take 1 tablet (2,000 Units total) by mouth daily. -     leflunomide (ARAVA) 20 MG tablet; Take 1 tablet (20 mg total) by mouth daily.   I am having [name] Hila. [name] maintain her aspirin EC, OXYGEN, vitamin C, Cranberry, Trelegy Ellipta, Ventolin HFA, isosorbide mononitrate, Entresto, celecoxib, triamterene-hydrochlorothiazide, meclizine, atorvastatin, carvedilol, alendronate, Vitamin D, clorazepate, leflunomide,  QUEtiapine, and traMADol.  Meds ordered this encounter  Medications   alendronate (FOSAMAX) 70 MG tablet    Sig: Take 1 tablet (70 mg total) by mouth every 7 (seven) days. Take with a full glass of water on an empty stomach. Do not lay down for at least 2 hours    Dispense:  4 tablet    Refill:  11   Cholecalciferol (VITAMIN D) 50 MCG (2000 UT) tablet    Sig: Take 1 tablet (2,000 Units total) by mouth daily.    Dispense:  90 tablet    Refill:  3   clorazepate (TRANXENE) 7.5 MG tablet    Sig: Take 1 tablet (7.5 mg total) by mouth daily as needed for anxiety.    Dispense:  30 tablet    Refill:  5   leflunomide (ARAVA) 20 MG tablet    Sig: Take 1 tablet (20 mg total) by mouth daily.    Dispense:  90 tablet    Refill:  3   QUEtiapine (SEROQUEL) 25 MG tablet    Sig: Take 1 tablet (25 mg total) by mouth at bedtime.    Dispense:  90 tablet    Refill:  1   traMADol (ULTRAM) 50 MG tablet    Sig: Take 1 tablet (50 mg total) by mouth every 12 (twelve) hours as needed for moderate pain.    Dispense:  30 tablet    Refill:  5   Informed pt. That controlled substances she takes require a visit not longer apart than 6 mos. Due to excellent DM control, I agreed to stretch to 6 mos.   Follow-up: Return in about 6 months (around 04/23/2023).  Mechele Claude, M.D.

## 2022-10-23 LAB — CBC WITH DIFFERENTIAL/PLATELET
Basophils Absolute: 0 10*3/uL (ref 0.0–0.2)
Basos: 0 %
EOS (ABSOLUTE): 0.3 10*3/uL (ref 0.0–0.4)
Eos: 3 %
Hemoglobin: 13 g/dL (ref 11.1–15.9)
Immature Grans (Abs): 0 10*3/uL (ref 0.0–0.1)
MCH: 27.1 pg (ref 26.6–33.0)
MCV: 84 fL (ref 79–97)
Monocytes Absolute: 0.9 10*3/uL (ref 0.1–0.9)
Monocytes: 9 %
Neutrophils Absolute: 5.7 10*3/uL (ref 1.4–7.0)
Neutrophils: 61 %
RBC: 4.79 x10E6/uL (ref 3.77–5.28)
WBC: 9.4 10*3/uL (ref 3.4–10.8)

## 2022-10-23 LAB — CMP14+EGFR
ALT: 8 IU/L (ref 0–32)
AST: 18 IU/L (ref 0–40)
Albumin/Globulin Ratio: 1.8
Albumin: 4.4 g/dL (ref 3.7–4.7)
Alkaline Phosphatase: 61 IU/L (ref 44–121)
BUN/Creatinine Ratio: 11 — ABNORMAL LOW (ref 12–28)
BUN: 9 mg/dL (ref 8–27)
Bilirubin Total: 0.5 mg/dL (ref 0.0–1.2)
CO2: 24 mmol/L (ref 20–29)
Calcium: 10.7 mg/dL — ABNORMAL HIGH (ref 8.7–10.3)
Chloride: 100 mmol/L (ref 96–106)
Creatinine, Ser: 0.79 mg/dL (ref 0.57–1.00)
Globulin, Total: 2.5 g/dL (ref 1.5–4.5)
Glucose: 110 mg/dL — ABNORMAL HIGH (ref 70–99)
Potassium: 4.4 mmol/L (ref 3.5–5.2)
Sodium: 141 mmol/L (ref 134–144)
Total Protein: 6.9 g/dL (ref 6.0–8.5)
eGFR: 73 mL/min/{1.73_m2} (ref >59)

## 2022-10-23 LAB — LIPID PANEL
Chol/HDL Ratio: 2.7 ratio (ref 0.0–4.4)
Cholesterol, Total: 246 mg/dL — ABNORMAL HIGH (ref 100–199)
HDL: 90 mg/dL (ref >39)
LDL Chol Calc (NIH): 144 mg/dL — ABNORMAL HIGH (ref 0–99)

## 2022-10-24 LAB — TOXASSURE SELECT 13 (MW), URINE

## 2022-10-24 NOTE — Progress Notes (Signed)
Patient returning call. Please call back. Also wants a print out of labs after discussed.

## 2022-11-13 ENCOUNTER — Ambulatory Visit: Payer: 59 | Attending: Student | Admitting: Student

## 2022-11-13 ENCOUNTER — Encounter: Payer: Self-pay | Admitting: Student

## 2022-11-13 VITALS — BP 146/82 | HR 73 | Ht 62.0 in | Wt 165.0 lb

## 2022-11-13 DIAGNOSIS — Z79899 Other long term (current) drug therapy: Secondary | ICD-10-CM

## 2022-11-13 DIAGNOSIS — Z01812 Encounter for preprocedural laboratory examination: Secondary | ICD-10-CM

## 2022-11-13 DIAGNOSIS — I7121 Aneurysm of the ascending aorta, without rupture: Secondary | ICD-10-CM

## 2022-11-13 DIAGNOSIS — I5020 Unspecified systolic (congestive) heart failure: Secondary | ICD-10-CM | POA: Diagnosis not present

## 2022-11-13 DIAGNOSIS — I25118 Atherosclerotic heart disease of native coronary artery with other forms of angina pectoris: Secondary | ICD-10-CM

## 2022-11-13 DIAGNOSIS — R55 Syncope and collapse: Secondary | ICD-10-CM

## 2022-11-13 DIAGNOSIS — E785 Hyperlipidemia, unspecified: Secondary | ICD-10-CM

## 2022-11-13 MED ORDER — ISOSORBIDE MONONITRATE ER 30 MG PO TB24: 30.0000 mg | ORAL_TABLET | Freq: Every day | ORAL | 3 refills | Status: DC

## 2022-11-13 MED ORDER — ROSUVASTATIN CALCIUM 20 MG PO TABS: 20.0000 mg | ORAL_TABLET | Freq: Every day | ORAL | 5 refills | Status: DC

## 2022-11-13 NOTE — Patient Instructions (Addendum)
Medication Instructions:   Stop Atorvastatin.  Start Crestor 20mg  daily.   Increase Imdur to 30mg  daily (entire tablet)  *If you need a refill on your cardiac medications before your next appointment, please call your pharmacy*   Lab Work:  FLP, LFT - orders given Reminder:  Nothing to eat or drink after 12 midnight prior to labs. Please do in 3 months.  If you have labs (blood work) drawn today and your tests are completely normal, you will receive your results only by: MyChart Message (if you have MyChart) OR A paper copy in the mail If you have any lab test that is abnormal or we need to change your treatment, we will call you to review the results.   Testing/Procedures: CTA of aorta - due September / early October    Follow-Up: At [facility], you and your health needs are our priority.  As part of our continuing mission to provide you with exceptional heart care, we have created designated Provider Care Teams.  These Care Teams include your primary Cardiologist (physician) and Advanced Practice Providers (APPs -  Physician Assistants and Nurse Practitioners) who all work together to provide you with the care you need, when you need it.  We recommend signing up for the patient portal called "MyChart".  Sign up information is provided on this After Visit Summary.  MyChart is used to connect with patients for Virtual Visits (Telemedicine).  Patients are able to view lab/test results, encounter notes, upcoming appointments, etc.  Non-urgent messages can be sent to your provider as well.   To learn more about what you can do with MyChart, go to [email or web address].    Your next appointment:   3-4 months   Provider:  Dr. Eden Emms   Other Instructions

## 2022-11-13 NOTE — Progress Notes (Signed)
Cardiology Office Note    Date:  11/13/2022  ID:  [name], DOB [birth year], MRN [id number] Cardiologist: Charlton Haws, MD    History of Present Illness:    [name] is a 87 y.o. female with past medical history of CAD (s/p DES to mid-LAD and DES to mid-OM1 in 11/2016, mild peri-infarct ischemia by NST in 07/2021 and medical management pursued), HFrEF (EF 40-45% in 2018, at 45% by repeat echo in 12/2018 and 35-40% in 05/2021 and 01/2022), HTN, HLD, RA, dilation of ascending aorta (at 4.2 cm by imaging in 2019 and 4.3 cm in 01/2022) and COPD who presents to the office today for 57-month follow-up.  She was examined by myself in 07/2022 and reported an episode of near syncope earlier in the week with reported heart rate in the 30's at that time. Otherwise, she had been in her normal state of health. Coreg had previously been reduced from 6.25 mg twice daily to 3.125 mg twice daily earlier in the week and was recommended to obtain a 2-week Zio patch to any significant arrhythmias. Her monitor showed predominantly normal sinus rhythm with one episode of NSVT for 16 beats. This was reviewed with Dr. Eden Emms who recommended continued medical therapy. She was also noted to have brief SVT with the longest being 20 beats but no significant bradycardia.  In talking with the patient, she reports a variety of symptoms today including arthritis along her hands and legs, generalized weakness and variable PO intake. Says she has been under increased stress as she did have a family member staying with her for a significant period of time. She has not been taking her statin regularly as she experienced cramps with this every time she would initiate the medication. She reports episodes of feeling like she needs to take ASA but it appears she is taking this due to palpitations and denies any chest pain or significant dyspnea. No orthopnea, PND or pitting edema.    Studies Reviewed:   EKG: EKG is not ordered  today.  Echocardiogram: 01/2022 IMPRESSIONS     1. Left ventricular ejection fraction, by estimation, is 35 to 40%. The  left ventricle has moderately decreased function. There is global  hypokinesis that appears slightly worse in the basal-to-mid inferior and  basal-to-mid inferoseptal segments. There  is mild concentric left ventricular hypertrophy. Indeterminate diastolic  filling due to E-A fusion. There is septal-lateral dyssynchrony in the  setting of a LBBB.   2. Right ventricular systolic function is normal. The right ventricular  size is normal. There is normal pulmonary artery systolic pressure. The  estimated right ventricular systolic pressure is 20.8 mmHg.   3. Moderate pericardial effusion. The pericardial effusion is localized  near the right ventricle and measures 1.6cm at max diameter. There is no  evidence of cardiac tamponade. Compared to prior TTE, the effusion appears  slightly larger on current study  but still within moderate range. Recommend serial monitoring with TTEs.   4. The mitral valve is normal in structure. Trivial mitral valve  regurgitation.   5. The aortic valve was not well visualized. There is mild calcification  of the aortic valve. There is mild thickening of the aortic valve. Aortic  valve regurgitation is trivial. Aortic valve sclerosis/calcification is  present, without any evidence of  aortic stenosis. Aortic valve mean gradient measures 5.0 mmHg.   6. The inferior vena cava is normal in size with greater than 50%  respiratory variability, suggesting right atrial pressure  of 3 mmHg.   7. Left atrial size was mildly dilated.   Comparison(s): Compared to prior TTE, there is no significant change. EF  remains stable at 35-40%. There continues to be a moderate pericardial  effusion that is slightly larger on current study. Recommend continued  monitoring as outpatient.    Event Monitor: 08/2022 Patch Wear Time:  13 days and 23 hours  (2024-03-20T14:38:42-0400 to 2024-04-03T14:38:34-0400)   Patient had a min HR of 49 bpm, max HR of 197 bpm, and avg HR of 75 bpm. Predominant underlying rhythm was Sinus Rhythm. Bundle Branch Block/IVCD was present. 11 Ventricular Tachycardia runs occurred, the run with the fastest interval lasting 16 beats  with a max rate of 197 bpm (avg 153 bpm); the run with the fastest interval was also the longest. 8 Supraventricular Tachycardia runs occurred, the run with the fastest interval lasting 7 beats with a max rate of 160 bpm, the longest lasting 20 beats  with an avg rate of 126 bpm. Isolated SVEs were rare (<1.0%), SVE Couplets were rare (<1.0%), and SVE Triplets were rare (<1.0%). Isolated VEs were occasional (3.6%, 53407), VE Couplets were rare (<1.0%, 3087), and VE Triplets were rare (<1.0%, 497).  Ventricular Bigeminy and Trigeminy were present.  Physical Exam:   VS:  BP (!) 146/82   Pulse 73   Ht 5\' 2"  (1.575 m)   Wt 165 lb (74.8 kg)   SpO2 99%   BMI 30.18 kg/m    Wt Readings from Last 3 Encounters:  11/13/22 165 lb (74.8 kg)  10/22/22 163 lb 6.4 oz (74.1 kg)  08/01/22 170 lb 9.6 oz (77.4 kg)     GEN: Pleasant, elderly female appearing in no acute distress NECK: No JVD; No carotid bruits CARDIAC: RRR, no murmurs, rubs, gallops RESPIRATORY:  Clear to auscultation without rales, wheezing or rhonchi  ABDOMEN: Appears non-distended. No obvious abdominal masses. EXTREMITIES: No clubbing or cyanosis. No pitting edema.  Distal pedal pulses are 2+ bilaterally.   Assessment and Plan:   1. CAD - She is s/p DES to mid-LAD and DES to mid-OM1 in 11/2016 with NST in 07/2021 showing mild peri-infarct ischemia and medical management pursued.  - She describes episodes of her heart feeling "funny" but I feel like this is secondary to palpitations by her description. Denies any associated chest pain but does have baseline dyspnea which has been stable.  Given her symptoms improved with ASA, I  did recommend that she try taking her Imdur as 30 mg daily instead of 15 mg daily. Continue ASA 81 mg daily, Coreg 3.125 mg twice daily and statin therapy (will switch from Atorvastatin to Crestor as discussed below).  2. HFrEF - Echocardiogram in 01/2022 showed her EF was at 35 to 40% and she has been continued on Coreg, Entresto and Imdur. She has not been on an SGLT2 inhibitor given her history of frequent UTI's.  - She appears euvolemic by examination today and her weight has been stable. We reviewed switching Maxzide to Spironolactone but she wants to hold off on this for now given that we are already adjusting her statin medication and Imdur. Would readdress at her next visit.  - Will continue Coreg and Entresto at current dosing but increase Imdur to 30 mg daily as discussed above.  3. Dilation of Ascending Aorta - This measured 4.3 cm by imaging in 01/2022. Reviewed with the patient today and will arrange for follow-up imaging in 01/2023.  4. History of Presyncope - Prior monitor  as discussed above showed episodes of VT with the longest lasting for 16 beats and she did have PVC's with a 3.6% burden. Findings were reviewed with Dr. Eden Emms who recommended medical therapy at that time. She denies any recurrent presyncopal episodes. Remains on Coreg 3.125 mg twice daily given intermittent palpitations (had bradycardia with higher dosing).   5. HLD - Her LDL was significantly elevated at 144 when checked in 10/2022 by review of notes she was not taking her statin regularly at that time. She has remained off of Atorvastatin as she had cramps with the medication despite cutting this in half. Reviewed options with the patient and will stop Atorvastatin and switch to Crestor 20 mg daily. I encouraged her to make [place] aware if unable to tolerate this as we could reduce dosing to 10 mg daily.  Signed, Ellsworth Lennox, PA-C

## 2022-11-20 ENCOUNTER — Ambulatory Visit (INDEPENDENT_AMBULATORY_CARE_PROVIDER_SITE_OTHER): Payer: 59 | Admitting: Pulmonary Disease

## 2022-11-20 ENCOUNTER — Encounter: Payer: Self-pay | Admitting: Pulmonary Disease

## 2022-11-20 VITALS — BP 114/71 | HR 79 | Ht 62.0 in | Wt 166.0 lb

## 2022-11-20 DIAGNOSIS — J4489 Other specified chronic obstructive pulmonary disease: Secondary | ICD-10-CM

## 2022-11-20 DIAGNOSIS — J9611 Chronic respiratory failure with hypoxia: Secondary | ICD-10-CM

## 2022-11-20 DIAGNOSIS — J432 Centrilobular emphysema: Secondary | ICD-10-CM

## 2022-11-20 MED ORDER — TRELEGY ELLIPTA 100-62.5-25 MCG/ACT IN AEPB: 1.0000 | INHALATION_SPRAY | Freq: Every day | RESPIRATORY_TRACT | 3 refills | Status: DC

## 2022-11-20 MED ORDER — ALBUTEROL SULFATE HFA 108 (90 BASE) MCG/ACT IN AERS: 2.0000 | INHALATION_SPRAY | Freq: Four times a day (QID) | RESPIRATORY_TRACT | 3 refills | Status: DC | PRN

## 2022-11-20 NOTE — Patient Instructions (Signed)
Follow up in 1 year.

## 2022-11-20 NOTE — Progress Notes (Signed)
[facility]  Chief Complaint  Patient presents with   Follow-up    Constitutional:  BP 114/71   Pulse 79   Ht 5\' 2"  (1.575 m)   Wt 166 lb (75.3 kg)   SpO2 95% Comment: room air  BMI 30.36 kg/m   Past Medical History:  Congenital lung cyst, HTN, CAD s/p DES, ischemic cardiomyopathy  Past Surgical History:  Her  has a past surgical history that includes Tonsillectomy and adenoidectomy; Complete hysterectomy; Benign breast tumors; Cholecystectomy; Colonoscopy; Colonoscopy (N/A, 09/22/2014); LEFT HEART CATH AND CORONARY ANGIOGRAPHY (N/A, 11/23/2016); and CORONARY STENT INTERVENTION (N/A, 11/23/2016).  Brief Summary:  [name] is a 87 y.o. female former smoker with COPD and chronic respiratory failure with hypoxia..       Subjective:   Breathing has been good.  Not having cough, wheeze, or sputum.  Gets sore in her chest sometimes if she takes a deep breath or coughs - been like that since she broke some ribs.  Sleeping okay.  Using oxygen at night and with activity during the day.  Hasn't needed albuterol much.  Physical Exam:   Appearance - well kempt   ENMT - no sinus tenderness, no oral exudate, no LAN, Mallampati 3 airway, no stridor  Respiratory - equal breath sounds bilaterally, no wheezing or rales  CV - s1s2 regular rate and rhythm, no murmurs  Ext - no clubbing, no edema  Skin - no rashes  Psych - normal mood and affect      Pulmonary testing:  PFT 08/31/11 >> FEV1 1.44 (70%), FEV1% 75, TLC 5.32 (108%), DLCO 61%  Cardiac Tests:  Echo 05/26/21 >> EF 35 to 40%, grade 1 DD, mod pericardial effusion, mild MR, mild AR  Social History:  She  reports that she quit smoking about 14 years ago. Her smoking use included cigarettes. She started smoking about 45 years ago. She has a 0.75 pack-year smoking history. She has never used smokeless tobacco. She reports that she does not drink alcohol and does not use  drugs.  Family History:  Her family history includes Aneurysm in her father; Diabetes in her brother; Heart disease in her brother, mother, and sister; Lung cancer in her brother.     Assessment/Plan:   COPD with asthma and emphysema. - continue trelegy 100 one puff daily - prn albuterol  Upper airway cough from post nasal drip in setting of seasonal allergic rhinitis. - singulair caused nightmares - prn flonase  Chronic respiratory failure with hypoxia from COPD. - 2 liters with exertion and sleep - goal SpO2 > 90%  CAD, ischemic CM, HTN, Thoracic aortic aneurysm. - followed by Dr. Charlton Haws with cardiology  Time Spent Involved in Patient Care on Day of Examination:  38 minutes  Follow up:   Patient Instructions  Follow up in 1 year  Medication List:   Allergies as of 11/20/2022       Reactions   Plavix [clopidogrel Bisulfate] Itching   Severe itching   Calcium Channel Blockers Other (See Comments)   cramping   Alprazolam Nausea And Vomiting   Codeine Nausea And Vomiting   Percodan [oxycodone-aspirin] Nausea And Vomiting   Valium Nausea And Vomiting        Medication List        Accurate as of November 20, 2022  1:28 PM. If you have any questions, ask your nurse or doctor.          albuterol 108 (  90 Base) MCG/ACT inhaler Commonly known as: Ventolin HFA Inhale 2 puffs into the lungs every 6 (six) hours as needed for wheezing or shortness of breath. What changed: See the new instructions. Changed by: Coralyn Helling, MD   alendronate 70 MG tablet Commonly known as: FOSAMAX Take 1 tablet (70 mg total) by mouth every 7 (seven) days. Take with a full glass of water on an empty stomach. Do not lay down for at least 2 hours   aspirin EC 81 MG tablet Take 81 mg by mouth every morning.   carvedilol 3.125 MG tablet Commonly known as: Coreg Take 1 tablet (3.125 mg total) by mouth 2 (two) times daily with a meal.   celecoxib 200 MG capsule Commonly known as:  CELEBREX Take 200 mg by mouth 2 (two) times daily.   clorazepate 7.5 MG tablet Commonly known as: TRANXENE Take 1 tablet (7.5 mg total) by mouth daily as needed for anxiety.   Cranberry 500 MG Tabs Take 1 tablet by mouth daily.   Entresto 24-26 MG Generic drug: sacubitril-valsartan Take 1 tablet by mouth 2 (two) times daily.   isosorbide mononitrate 30 MG 24 hr tablet Commonly known as: IMDUR Take 1 tablet (30 mg total) by mouth daily.   leflunomide 20 MG tablet Commonly known as: ARAVA Take 1 tablet (20 mg total) by mouth daily.   meclizine 25 MG tablet Commonly known as: ANTIVERT TAKE 1 TABLET BY MOUTH DAILY AS NEEDED FOR DIZZINESS.   OXYGEN Inhale 2 L into the lungs at bedtime. Can use in the morning as needed   QUEtiapine 25 MG tablet Commonly known as: SEROQUEL Take 1 tablet (25 mg total) by mouth at bedtime.   rosuvastatin 20 MG tablet Commonly known as: Crestor Take 1 tablet (20 mg total) by mouth daily.   traMADol 50 MG tablet Commonly known as: ULTRAM Take 1 tablet (50 mg total) by mouth every 12 (twelve) hours as needed for moderate pain.   Trelegy Ellipta 100-62.5-25 MCG/ACT Aepb Generic drug: Fluticasone-Umeclidin-Vilant Inhale 1 puff into the lungs daily in the afternoon. What changed: See the new instructions. Changed by: Coralyn Helling, MD   triamterene-hydrochlorothiazide 37.5-25 MG tablet Commonly known as: MAXZIDE-25 Take 1 tablet by mouth daily. For blood pressure and fluid   vitamin C 100 MG tablet Take 100 mg by mouth daily.   Vitamin D 50 MCG (2000 UT) tablet Take 1 tablet (2,000 Units total) by mouth daily.        Signature:  Coralyn Helling, MD [facility]/Critical Care Pager - [phone] 11/20/2022, 1:28 PM

## 2023-01-01 ENCOUNTER — Other Ambulatory Visit: Payer: Self-pay | Admitting: Family Medicine

## 2023-01-21 ENCOUNTER — Other Ambulatory Visit: Payer: Self-pay | Admitting: Cardiovascular Disease

## 2023-01-24 ENCOUNTER — Ambulatory Visit (INDEPENDENT_AMBULATORY_CARE_PROVIDER_SITE_OTHER): Payer: 59 | Admitting: Family Medicine

## 2023-01-24 ENCOUNTER — Encounter: Payer: Self-pay | Admitting: Family Medicine

## 2023-01-24 VITALS — BP 106/51 | HR 64 | Temp 97.8°F | Ht 62.0 in | Wt 162.1 lb

## 2023-01-24 DIAGNOSIS — N3000 Acute cystitis without hematuria: Secondary | ICD-10-CM | POA: Diagnosis not present

## 2023-01-24 LAB — MICROSCOPIC EXAMINATION
Renal Epithel, UA: NONE SEEN /HPF
WBC, UA: >30 /HPF — AB (ref 0–5)
Yeast, UA: NONE SEEN

## 2023-01-24 LAB — URINALYSIS, ROUTINE W REFLEX MICROSCOPIC
Bilirubin, UA: NEGATIVE
Glucose, UA: NEGATIVE
Ketones, UA: NEGATIVE
Nitrite, UA: POSITIVE — AB
Protein,UA: NEGATIVE
Specific Gravity, UA: 1.01 (ref 1.005–1.030)
Urobilinogen, Ur: 1 mg/dL (ref 0.2–1.0)
pH, UA: 7 (ref 5.0–7.5)

## 2023-01-24 MED ORDER — CEPHALEXIN 500 MG PO CAPS
500.0000 mg | ORAL_CAPSULE | Freq: Two times a day (BID) | ORAL | 0 refills | Status: AC
Start: 2023-01-24 — End: ?

## 2023-01-24 NOTE — Progress Notes (Signed)
   Acute Office Visit  Subjective:     Patient ID: [name], female    DOB: [birth year], 87 y.o.   MRN: [id number]  Chief Complaint  Patient presents with   Abdominal Pain    Abdominal Pain This is a new problem. The current episode started 1 to 4 weeks ago. The onset quality is gradual. The problem occurs intermittently. The problem has been gradually worsening. The pain is located in the LLQ, RLQ and suprapubic region. The abdominal pain does not radiate. Associated symptoms include frequency and nausea. Pertinent negatives include no anorexia, arthralgias, constipation, diarrhea, dysuria, fever, headaches, hematochezia, hematuria, vomiting or weight loss. The pain is relieved by Certain positions. She has tried nothing for the symptoms. The treatment provided no relief.    Review of Systems  Constitutional:  Negative for fever and weight loss.  Gastrointestinal:  Positive for abdominal pain and nausea. Negative for anorexia, constipation, diarrhea, hematochezia and vomiting.  Genitourinary:  Positive for frequency. Negative for dysuria and hematuria.  Musculoskeletal:  Negative for arthralgias.  Neurological:  Negative for headaches.        Objective:    BP (!) 106/51   Pulse 64   Temp 97.8 F (36.6 C) (Temporal)   Ht 5\' 2"  (1.575 m)   Wt 162 lb 2 oz (73.5 kg)   SpO2 95%   BMI 29.65 kg/m    Physical Exam Vitals and nursing note reviewed.  Constitutional:      General: She is not in acute distress.    Appearance: She is not ill-appearing, toxic-appearing or diaphoretic.  Cardiovascular:     Rate and Rhythm: Normal rate and regular rhythm.     Heart sounds: Normal heart sounds. No murmur heard. Pulmonary:     Effort: Pulmonary effort is normal. No respiratory distress.     Breath sounds: Normal breath sounds.  Abdominal:     General: Bowel sounds are normal. There is no distension.     Palpations: Abdomen is soft.     Tenderness: There is no abdominal  tenderness. There is no right CVA tenderness, left CVA tenderness, guarding or rebound.  Musculoskeletal:     Right lower leg: No edema.     Left lower leg: No edema.  Skin:    General: Skin is warm and dry.  Neurological:     Mental Status: She is alert and oriented to person, place, and time. Mental status is at baseline.     Gait: Gait abnormal (using cane).  Psychiatric:        Mood and Affect: Mood normal.        Behavior: Behavior normal.     Urine dipstick shows positive for RBC's, positive for nitrates, and positive for leukocytes.  Micro exam: >30 WBC's per HPF, 0-2 RBC's per HPF, and many+ bacteria.       Assessment & Plan:   Syrina was seen today for abdominal pain.  Diagnoses and all orders for this visit:  Acute cystitis without hematuria Keflex as below. Increase hydration. Return to office for new or worsening symptoms, or if symptoms persist.  -     Urinalysis, Routine w reflex microscopic -     cephALEXin (KEFLEX) 500 MG capsule; Take 1 capsule (500 mg total) by mouth 2 (two) times daily. -     Urine Culture  The patient indicates understanding of these issues and agrees with the plan.   Gabriel Earing, FNP

## 2023-01-27 LAB — URINE CULTURE

## 2023-02-03 ENCOUNTER — Emergency Department (HOSPITAL_COMMUNITY)
Admission: EM | Admit: 2023-02-03 | Discharge: 2023-02-03 | Disposition: A | Discharge status: Home / Self Care | Payer: 59 | Attending: Emergency Medicine | Admitting: Emergency Medicine

## 2023-02-03 ENCOUNTER — Encounter (HOSPITAL_COMMUNITY): Payer: Self-pay

## 2023-02-03 ENCOUNTER — Emergency Department (HOSPITAL_COMMUNITY): Payer: 59

## 2023-02-03 ENCOUNTER — Other Ambulatory Visit: Payer: Self-pay

## 2023-02-03 DIAGNOSIS — R1032 Left lower quadrant pain: Secondary | ICD-10-CM | POA: Diagnosis present

## 2023-02-03 DIAGNOSIS — I10 Essential (primary) hypertension: Secondary | ICD-10-CM | POA: Insufficient documentation

## 2023-02-03 DIAGNOSIS — K5792 Diverticulitis of intestine, part unspecified, without perforation or abscess without bleeding: Secondary | ICD-10-CM

## 2023-02-03 DIAGNOSIS — Z7982 Long term (current) use of aspirin: Secondary | ICD-10-CM | POA: Insufficient documentation

## 2023-02-03 DIAGNOSIS — K5732 Diverticulitis of large intestine without perforation or abscess without bleeding: Secondary | ICD-10-CM | POA: Insufficient documentation

## 2023-02-03 DIAGNOSIS — Z79899 Other long term (current) drug therapy: Secondary | ICD-10-CM | POA: Insufficient documentation

## 2023-02-03 DIAGNOSIS — J449 Chronic obstructive pulmonary disease, unspecified: Secondary | ICD-10-CM | POA: Insufficient documentation

## 2023-02-03 LAB — COMPREHENSIVE METABOLIC PANEL
ALT: 11 U/L (ref 0–44)
AST: 17 U/L (ref 15–41)
Albumin: 3.6 g/dL (ref 3.5–5.0)
Alkaline Phosphatase: 45 U/L (ref 38–126)
Anion gap: 10 (ref 5–15)
BUN: 9 mg/dL (ref 8–23)
CO2: 26 mmol/L (ref 22–32)
Calcium: 9.5 mg/dL (ref 8.9–10.3)
Chloride: 97 mmol/L — ABNORMAL LOW (ref 98–111)
Creatinine, Ser: 0.67 mg/dL (ref 0.44–1.00)
GFR, Estimated: >60 mL/min (ref >60)
Glucose, Bld: 121 mg/dL — ABNORMAL HIGH (ref 70–99)
Potassium: 3.7 mmol/L (ref 3.5–5.1)
Sodium: 133 mmol/L — ABNORMAL LOW (ref 135–145)
Total Bilirubin: 1.1 mg/dL (ref 0.3–1.2)
Total Protein: 6.3 g/dL — ABNORMAL LOW (ref 6.5–8.1)

## 2023-02-03 LAB — URINALYSIS, ROUTINE W REFLEX MICROSCOPIC
Bilirubin Urine: NEGATIVE
Glucose, UA: NEGATIVE mg/dL
Hgb urine dipstick: NEGATIVE
Ketones, ur: NEGATIVE mg/dL
Leukocytes,Ua: NEGATIVE
Nitrite: NEGATIVE
Protein, ur: NEGATIVE mg/dL
Specific Gravity, Urine: 1.012 (ref 1.005–1.030)
pH: 7 (ref 5.0–8.0)

## 2023-02-03 LAB — DIFFERENTIAL
Abs Immature Granulocytes: 0.03 10*3/uL (ref 0.00–0.07)
Basophils Absolute: 0 10*3/uL (ref 0.0–0.1)
Basophils Relative: 0 %
Eosinophils Absolute: 0.3 10*3/uL (ref 0.0–0.5)
Eosinophils Relative: 2 %
Immature Granulocytes: 0 %
Lymphocytes Relative: 15 %
Lymphs Abs: 1.7 10*3/uL (ref 0.7–4.0)
Monocytes Absolute: 1.1 10*3/uL — ABNORMAL HIGH (ref 0.1–1.0)
Monocytes Relative: 10 %
Neutro Abs: 7.9 10*3/uL — ABNORMAL HIGH (ref 1.7–7.7)
Neutrophils Relative %: 73 %

## 2023-02-03 LAB — CBC
HCT: 36.9 % (ref 36.0–46.0)
Hemoglobin: 11.8 g/dL — ABNORMAL LOW (ref 12.0–15.0)
MCH: 27.2 pg (ref 26.0–34.0)
MCHC: 32 g/dL (ref 30.0–36.0)
MCV: 85 fL (ref 80.0–100.0)
Platelets: 260 10*3/uL (ref 150–400)
RBC: 4.34 MIL/uL (ref 3.87–5.11)
RDW: 14.7 % (ref 11.5–15.5)
WBC: 11.2 10*3/uL — ABNORMAL HIGH (ref 4.0–10.5)
nRBC: 0 % (ref 0.0–0.2)

## 2023-02-03 LAB — LIPASE, BLOOD: Lipase: 23 U/L (ref 11–51)

## 2023-02-03 LAB — POC OCCULT BLOOD, ED: Fecal Occult Blood: NEGATIVE — NL

## 2023-02-03 MED ORDER — IOHEXOL 300 MG/ML  SOLN
100.0000 mL | Freq: Once | INTRAMUSCULAR | Status: AC | PRN
  Administered 2023-02-03: 100 mL via INTRAVENOUS

## 2023-02-03 MED ORDER — AMOXICILLIN-POT CLAVULANATE 875-125 MG PO TABS
1.0000 | ORAL_TABLET | Freq: Once | ORAL | Status: AC
  Administered 2023-02-03: 1 via ORAL
  Filled 2023-02-03: qty 1

## 2023-02-03 MED ORDER — AMOXICILLIN-POT CLAVULANATE 875-125 MG PO TABS: 1.0000 | ORAL_TABLET | Freq: Two times a day (BID) | ORAL | 0 refills | Status: DC

## 2023-02-03 NOTE — ED Notes (Signed)
Pt calling her ride at this time

## 2023-02-03 NOTE — ED Triage Notes (Signed)
BIB [facility] from home for lower abdominal pain. Pt has been treated for the past week with antibiotic for UTI that original was causing pain in the RLQ and now has starting the whole lower abdomen. Pt states taking her last dose of antibiotic yesterday.

## 2023-02-03 NOTE — ED Provider Notes (Addendum)
[facility] EMERGENCY DEPARTMENT AT [facility] Provider Note   CSN: [id number] Arrival date & time: 02/03/23  6301     History  Chief Complaint  Patient presents with   Abdominal Pain    [name] is a 87 y.o. female.  Patient with complaint of bilateral lower quadrant abdominal pain for over a month.  But got worse recently.  Patient seen by primary care doctor September 12 with diagnosis of a urinary tract infection urine culture was done grew Klebsiella patient was treated with Keflex.  Based on culture sensitive to that.  Patient still having persistent lower quadrant abdominal pain.  Patient's had some loose bowel movements with it.  She said that maybe there was some blood in it.  Rectal exam done here heme-negative.  Patient denies any nausea or vomiting.  Temp here 98.8 blood pressure 116/40 pulse 79 oxygen saturation 98.  Patient is not on any blood thinners.  Patient known to have a thoracic ascending aortic aneurysm 4.3 cm in diagnosed in April 2016.  Patient does have follow-up with that coming up.  Patient not having any chest pain.  Patient known to have hypertension COPD nonischemic cardiomyopathy ejection fraction 35 to 40%.  Osteoarthritis chronic systolic congestive heart failure coronary artery disease past surgical history significant for complete hysterectomy gallbladder removal heart cath in 2018 stent placed in 2018 patient is a previous smoker quit in 2010.       Home Medications Prior to Admission medications   Medication Sig Start Date End Date Taking? Authorizing Provider  albuterol (VENTOLIN HFA) 108 (90 Base) MCG/ACT inhaler Inhale 2 puffs into the lungs every 6 (six) hours as needed for wheezing or shortness of breath. 11/20/22   Coralyn Helling, MD  alendronate (FOSAMAX) 70 MG tablet Take 1 tablet (70 mg total) by mouth every 7 (seven) days. Take with a full glass of water on an empty stomach. Do not lay down for at least 2 hours 10/22/22   Mechele Claude, MD  Ascorbic Acid (VITAMIN C) 100 MG tablet Take 100 mg by mouth daily.    [provider]  aspirin EC 81 MG tablet Take 81 mg by mouth every morning.     [provider]  carvedilol (COREG) 3.125 MG tablet Take 1 tablet (3.125 mg total) by mouth 2 (two) times daily with a meal. 07/30/22   Wendall Stade, MD  celecoxib (CELEBREX) 200 MG capsule TAKE ONE CAPSULE BY MOUTH ONCE DAILY WITH FOOD. 01/01/23   Mechele Claude, MD  cephALEXin (KEFLEX) 500 MG capsule Take 1 capsule (500 mg total) by mouth 2 (two) times daily. 01/24/23   Gabriel Earing, FNP  Cholecalciferol (VITAMIN D) 50 MCG (2000 UT) tablet Take 1 tablet (2,000 Units total) by mouth daily. 10/22/22   Mechele Claude, MD  clorazepate (TRANXENE) 7.5 MG tablet Take 1 tablet (7.5 mg total) by mouth daily as needed for anxiety. 10/22/22   Mechele Claude, MD  Cranberry 500 MG TABS Take 1 tablet by mouth daily.    [provider]  Fluticasone-Umeclidin-Vilant (TRELEGY ELLIPTA) 100-62.5-25 MCG/ACT AEPB Inhale 1 puff into the lungs daily in the afternoon. 11/20/22   Coralyn Helling, MD  isosorbide mononitrate (IMDUR) 30 MG 24 hr tablet Take 1 tablet (30 mg total) by mouth daily. 11/13/22   Strader, Lennart Pall, PA-C  leflunomide (ARAVA) 20 MG tablet Take 1 tablet (20 mg total) by mouth daily. 10/22/22   Mechele Claude, MD  meclizine (ANTIVERT) 25 MG  tablet TAKE 1 TABLET BY MOUTH DAILY AS NEEDED FOR DIZZINESS. 05/21/22   Wendall Stade, MD  OXYGEN Inhale 2 L into the lungs at bedtime. Can use in the morning as needed    [provider]  QUEtiapine (SEROQUEL) 25 MG tablet Take 1 tablet (25 mg total) by mouth at bedtime. 10/22/22   Mechele Claude, MD  rosuvastatin (CRESTOR) 20 MG tablet Take 1 tablet (20 mg total) by mouth daily. 11/13/22   Strader, Lennart Pall, PA-C  sacubitril-valsartan (ENTRESTO) 24-26 MG TAKE 1 TABLET BY MOUTH TWICE DAILY. 01/22/23   Wendall Stade, MD  traMADol (ULTRAM) 50 MG tablet Take 1 tablet (50 mg  total) by mouth every 12 (twelve) hours as needed for moderate pain. 10/22/22   Mechele Claude, MD  triamterene-hydrochlorothiazide (MAXZIDE-25) 37.5-25 MG tablet Take 1 tablet by mouth daily. For blood pressure and fluid 04/03/22   Mechele Claude, MD      Allergies    Plavix [clopidogrel bisulfate], Calcium channel blockers, Alprazolam, Codeine, Percodan [oxycodone-aspirin], and Valium    Review of Systems   Review of Systems  Constitutional:  Negative for chills and fever.  HENT:  Negative for ear pain and sore throat.   Eyes:  Negative for pain and visual disturbance.  Respiratory:  Negative for cough and shortness of breath.   Cardiovascular:  Negative for chest pain and palpitations.  Gastrointestinal:  Positive for abdominal pain and diarrhea. Negative for vomiting.  Genitourinary:  Negative for dysuria and hematuria.  Musculoskeletal:  Negative for arthralgias and back pain.  Skin:  Negative for color change and rash.  Neurological:  Negative for seizures and syncope.  All other systems reviewed and are negative.   Physical Exam Updated Vital Signs BP (!) 130/47   Pulse 85   Temp 98.8 F (37.1 C) (Oral)   Resp 16   Ht 1.575 m (5\' 2" )   Wt 73.5 kg   SpO2 97%   BMI 29.65 kg/m  Physical Exam Vitals and nursing note reviewed.  Constitutional:      General: She is not in acute distress.    Appearance: Normal appearance. She is well-developed.  HENT:     Head: Normocephalic and atraumatic.  Eyes:     Extraocular Movements: Extraocular movements intact.     Conjunctiva/sclera: Conjunctivae normal.     Pupils: Pupils are equal, round, and reactive to light.  Cardiovascular:     Rate and Rhythm: Normal rate and regular rhythm.     Heart sounds: No murmur heard. Pulmonary:     Effort: Pulmonary effort is normal. No respiratory distress.     Breath sounds: Normal breath sounds.  Abdominal:     General: There is no distension.     Palpations: Abdomen is soft.      Tenderness: There is abdominal tenderness. There is guarding.     Comments: Tender to palpation bilateral lower quadrants.  Greater left lower quadrant.  Genitourinary:    Rectum: Guaiac result negative.     Comments: Stool brown Hemoccult negative Musculoskeletal:        General: No swelling.     Cervical back: Neck supple.  Skin:    General: Skin is warm and dry.     Capillary Refill: Capillary refill takes less than 2 seconds.  Neurological:     General: No focal deficit present.     Mental Status: She is alert and oriented to person, place, and time.  Psychiatric:  Mood and Affect: Mood normal.     ED Results / Procedures / Treatments   Labs (all labs ordered are listed, but only abnormal results are displayed) Labs Reviewed  COMPREHENSIVE METABOLIC PANEL - Abnormal; Notable for the following components:      Result Value   Sodium 133 (*)    Chloride 97 (*)    Glucose, Bld 121 (*)    Total Protein 6.3 (*)    All other components within normal limits  CBC - Abnormal; Notable for the following components:   WBC 11.2 (*)    Hemoglobin 11.8 (*)    All other components within normal limits  DIFFERENTIAL - Abnormal; Notable for the following components:   Neutro Abs 7.9 (*)    Monocytes Absolute 1.1 (*)    All other components within normal limits  LIPASE, BLOOD  URINALYSIS, ROUTINE W REFLEX MICROSCOPIC  POC OCCULT BLOOD, ED    EKG None  Radiology CT ABDOMEN PELVIS W CONTRAST  Result Date: 02/03/2023 CLINICAL DATA:  Worsening bilateral lower quadrant pain. Currently on antibiotic therapy for urinary tract infection. EXAM: CT ABDOMEN AND PELVIS WITH CONTRAST TECHNIQUE: Multidetector CT imaging of the abdomen and pelvis was performed using the standard protocol following bolus administration of intravenous contrast. RADIATION DOSE REDUCTION: This exam was performed according to the departmental dose-optimization program which includes automated exposure control,  adjustment of the mA and/or kV according to patient size and/or use of iterative reconstruction technique. CONTRAST:  OMNIPAQUE IOHEXOL 300 MG/ML  SOLN COMPARISON:  11/11/2017 FINDINGS: Lower Chest: No acute findings.  Stable cardiomegaly. Hepatobiliary: Stable 2.7 cm benign hemangioma in the posterior right hepatic lobe. Probable tiny cyst near the junction of the right and left lobes also unchanged. No new or enlarging liver lesions identified. Prior cholecystectomy. No evidence of biliary obstruction. Pancreas:  No mass or inflammatory changes. Spleen: Within normal limits in size and appearance. Adrenals/Urinary Tract: No suspicious masses identified. No evidence of ureteral calculi or hydronephrosis. Pelvic floor laxity with small cystocele noted. Otherwise unremarkable appearance of urinary bladder. Stomach/Bowel: Mild sigmoid diverticulitis is seen. No evidence of bowel perforation or abscess. Vascular/Lymphatic: No pathologically enlarged lymph nodes. No acute vascular findings. Reproductive: Prior hysterectomy noted. Adnexal regions are unremarkable in appearance. Other:  None. Musculoskeletal:  No suspicious bone lesions identified. IMPRESSION: Mild sigmoid diverticulitis. No evidence of bowel perforation or abscess. Pelvic floor laxity with small cystocele. Electronically Signed   By: Danae Orleans M.D.   On: 02/03/2023 09:51    Procedures Procedures    Medications Ordered in ED Medications  iohexol (OMNIPAQUE) 300 MG/ML solution 100 mL (100 mLs Intravenous Contrast Given 02/03/23 4166)    ED Course/ Medical Decision Making/ A&P                                 Medical Decision Making Amount and/or Complexity of Data Reviewed Labs: ordered. Radiology: ordered.  Risk Prescription drug management.   Patient with lower quadrant abdominal pain for over a month.  Patient recently treated on September 12 for urinary tract infection.  Was treated with Keflex.  Culture showed that it  grew Klebsiella sensitive to Keflex.  Urinalysis here today negative not consistent with urinary tract infection.  Point-of-care Hemoccult negative as well.  Lipase 23 complete metabolic panel sodium 133 glucose 121 GFR greater than 16 LFTs normal.  CBC white count 11.2 hemoglobin 11.8 platelets 260.  CT scan of  the abdomen and mild sigmoid diverticulitis no evidence of bowel perforation or abscess.  Pelvic floor laxity with small cystocele.  Clinically the finding of diverticulitis fits her presentation.  Patient's vital signs are normal no fever.  Blood pressure 116/40 pulse 79.  Patient nontoxic no acute distress.  Patient should be a good candidate to be treated with oral Augmentin.  Will give first dose here.  Patient insist that antibiotic prescription be sent to [facility].  They are not open today.  She says she will pick it up tomorrow.  Will give 1 dose here.  But she will miss the evening dose probably fine since the symptoms been ongoing for a while.   Final Clinical Impression(s) / ED Diagnoses Final diagnoses:  Diverticulitis    Rx / DC Orders ED Discharge Orders     None         Vanetta Mulders, MD 02/03/23 1016    Vanetta Mulders, MD 02/03/23 1035

## 2023-02-03 NOTE — Discharge Instructions (Addendum)
Take the antibiotic Augmentin as directed for the next 7 days.  Return for any new or worse symptoms.  Would expect improvement over the next couple days and then starting to feel much better a few days after that.  Workup here today showed diverticulitis.  Also would make an appointment follow back up with your primary care doctor.  No evidence of urinary tract infection today.

## 2023-02-12 ENCOUNTER — Encounter: Payer: Self-pay | Admitting: Family Medicine

## 2023-02-12 ENCOUNTER — Ambulatory Visit (INDEPENDENT_AMBULATORY_CARE_PROVIDER_SITE_OTHER): Payer: 59 | Admitting: Family Medicine

## 2023-02-12 VITALS — BP 135/72 | HR 90 | Temp 97.8°F | Ht 62.0 in | Wt 165.4 lb

## 2023-02-12 DIAGNOSIS — K921 Melena: Secondary | ICD-10-CM

## 2023-02-12 DIAGNOSIS — Z789 Other specified health status: Secondary | ICD-10-CM

## 2023-02-12 DIAGNOSIS — E782 Mixed hyperlipidemia: Secondary | ICD-10-CM | POA: Diagnosis not present

## 2023-02-12 DIAGNOSIS — K5792 Diverticulitis of intestine, part unspecified, without perforation or abscess without bleeding: Secondary | ICD-10-CM | POA: Diagnosis not present

## 2023-02-12 MED ORDER — CIPROFLOXACIN HCL 500 MG PO TABS: 500.0000 mg | ORAL_TABLET | Freq: Two times a day (BID) | ORAL | 0 refills | Status: DC

## 2023-02-12 MED ORDER — METRONIDAZOLE 500 MG PO TABS: 500.0000 mg | ORAL_TABLET | Freq: Two times a day (BID) | ORAL | 0 refills | Status: DC

## 2023-02-12 MED ORDER — PSYLLIUM 30.9 % PO POWD: ORAL | Status: DC

## 2023-02-12 NOTE — Progress Notes (Signed)
Subjective:  Patient ID: [name], female    DOB: 02-15-1936  Age: 87 y.o. MRN: [id number]  CC: ER FOLLOW UP (DIVERTICULITIS/)   HPI Kateria Choquette Hankins presents for [facility] to ED and had CT that showed diverticulitis. Some LLQ pain persists after treatment. Right sore also. Took augmentin prescribed at E.D. with some relief. Cephalexin didin't help. Having some blood in stools. No diarrhea or constipation. Appetite is okay.   Having cramps in legs with rosuvastatin. Face would draw.      02/12/2023   11:40 AM 10/22/2022   10:28 AM 10/22/2022   10:20 AM  Depression screen PHQ 2/9  Decreased Interest 0 0 0  Down, Depressed, Hopeless 0 0 0  PHQ - 2 Score 0 0 0  Altered sleeping  0   Tired, decreased energy  1   Change in appetite  0   Feeling bad or failure about yourself   0   Trouble concentrating  1   Moving slowly or fidgety/restless  1   Suicidal thoughts  0   PHQ-9 Score  3   Difficult doing work/chores  Somewhat difficult     History Aldina has a past medical history of Acute on chronic respiratory failure with hypoxia (HCC) (04/24/2018), Anxiety, Atypical chest pain, Bronchospasm, CAD (coronary artery disease), Cervical disc disorder with myelopathy, unspecified cervical region, Cervicalgia (01/19/2009), Chest pain at rest (01/27/2015), Chronic systolic (congestive) heart failure (HCC), COPD (chronic obstructive pulmonary disease) (HCC), Coronary artery disease, De Quervain's disease (tenosynovitis) (04/02/2011), Disc disease with myelopathy, cervical, Essential hypertension, Hemorrhoids, Liver mass, Lung, cysts, congenital, Myocardial infarction (HCC), Nephrolithiasis, Nonischemic cardiomyopathy (HCC), On home O2, Osteoarthritis, Sprain of wrist (08/07/2012), and Thoracic ascending aortic aneurysm (HCC).   She has a past surgical history that includes Tonsillectomy and adenoidectomy; Complete hysterectomy; Benign breast tumors; Cholecystectomy; Colonoscopy; Colonoscopy (N/A,  09/22/2014); LEFT HEART CATH AND CORONARY ANGIOGRAPHY (N/A, 11/23/2016); and CORONARY STENT INTERVENTION (N/A, 11/23/2016).   Her family history includes Aneurysm in her father; Diabetes in her brother; Heart disease in her brother, mother, and sister; Lung cancer in her brother.She reports that she quit smoking about 14 years ago. Her smoking use included cigarettes. She started smoking about 45 years ago. She has a 7.8 pack-year smoking history. She has never used smokeless tobacco. She reports that she does not drink alcohol and does not use drugs.    ROS Review of Systems  Constitutional: Negative.   HENT: Negative.  Negative for congestion.   Eyes:  Negative for visual disturbance.  Respiratory:  Negative for shortness of breath.   Cardiovascular:  Negative for chest pain.  Gastrointestinal:  Positive for abdominal pain, constipation, diarrhea and nausea. Negative for vomiting.  Genitourinary:  Negative for difficulty urinating.  Musculoskeletal:  Negative for arthralgias and myalgias.  Neurological:  Negative for headaches.  Psychiatric/Behavioral:  Negative for sleep disturbance.     Objective:  BP 135/72   Pulse 90   Temp 97.8 F (36.6 C)   Ht 5\' 2"  (1.575 m)   Wt 165 lb 6.4 oz (75 kg)   SpO2 95%   BMI 30.25 kg/m   BP Readings from Last 3 Encounters:  02/12/23 135/72  02/03/23 (!) 134/51  01/24/23 (!) 106/51    Wt Readings from Last 3 Encounters:  02/12/23 165 lb 6.4 oz (75 kg)  02/03/23 162 lb 1.7 oz (73.5 kg)  01/24/23 162 lb 2 oz (73.5 kg)     Physical Exam Constitutional:      General:  She is not in acute distress.    Appearance: She is well-developed.  Cardiovascular:     Rate and Rhythm: Normal rate and regular rhythm.  Pulmonary:     Breath sounds: Normal breath sounds.  Abdominal:     General: There is distension.     Palpations: There is no mass.     Tenderness: There is abdominal tenderness (BLQ, mild to moderate). There is no guarding or rebound.   Musculoskeletal:        General: Normal range of motion.  Skin:    General: Skin is warm and dry.  Neurological:     Mental Status: She is alert and oriented to person, place, and time.       Assessment & Plan:   Talani was seen today for er follow up.  Diagnoses and all orders for this visit:  Diverticulitis  Hematochezia  Statin intolerance  Mixed hyperlipidemia  Other orders -     metroNIDAZOLE (FLAGYL) 500 MG tablet; Take 1 tablet (500 mg total) by mouth 2 (two) times daily. -     ciprofloxacin (CIPRO) 500 MG tablet; Take 1 tablet (500 mg total) by mouth 2 (two) times daily. -     Psyllium 30.9 % POWD; Drink 1 tablespoon dissolved in wate twice a day       I have discontinued Aliahna J. Karn's cephALEXin and amoxicillin-clavulanate. I am also having her start on metroNIDAZOLE, ciprofloxacin, and Psyllium. Additionally, I am having her maintain her aspirin EC, OXYGEN, vitamin C, Cranberry, triamterene-hydrochlorothiazide, meclizine, carvedilol, alendronate, Vitamin D, clorazepate, leflunomide, QUEtiapine, traMADol, isosorbide mononitrate, rosuvastatin, albuterol, Trelegy Ellipta, celecoxib, and Entresto.  Allergies as of 02/12/2023       Reactions   Plavix [clopidogrel Bisulfate] Itching   Severe itching   Calcium Channel Blockers Other (See Comments)   cramping   Alprazolam Nausea And Vomiting   Codeine Nausea And Vomiting   Percodan [oxycodone-aspirin] Nausea And Vomiting   Valium Nausea And Vomiting        Medication List        Accurate as of February 12, 2023  9:01 PM. If you have any questions, ask your nurse or doctor.          STOP taking these medications    amoxicillin-clavulanate 875-125 MG tablet Commonly known as: AUGMENTIN Stopped by: Rook Maue   cephALEXin 500 MG capsule Commonly known as: Keflex Stopped by: Sury Wentworth       TAKE these medications    albuterol 108 (90 Base) MCG/ACT inhaler Commonly known as: Ventolin  HFA Inhale 2 puffs into the lungs every 6 (six) hours as needed for wheezing or shortness of breath.   alendronate 70 MG tablet Commonly known as: FOSAMAX Take 1 tablet (70 mg total) by mouth every 7 (seven) days. Take with a full glass of water on an empty stomach. Do not lay down for at least 2 hours   aspirin EC 81 MG tablet Take 81 mg by mouth every morning.   carvedilol 3.125 MG tablet Commonly known as: Coreg Take 1 tablet (3.125 mg total) by mouth 2 (two) times daily with a meal.   celecoxib 200 MG capsule Commonly known as: CELEBREX TAKE ONE CAPSULE BY MOUTH ONCE DAILY WITH FOOD.   ciprofloxacin 500 MG tablet Commonly known as: Cipro Take 1 tablet (500 mg total) by mouth 2 (two) times daily. Started by: Farrell Broerman   clorazepate 7.5 MG tablet Commonly known as: TRANXENE Take 1 tablet (7.5 mg total) by  mouth daily as needed for anxiety.   Cranberry 500 MG Tabs Take 1 tablet by mouth daily.   Entresto 24-26 MG Generic drug: sacubitril-valsartan TAKE 1 TABLET BY MOUTH TWICE DAILY.   isosorbide mononitrate 30 MG 24 hr tablet Commonly known as: IMDUR Take 1 tablet (30 mg total) by mouth daily.   leflunomide 20 MG tablet Commonly known as: ARAVA Take 1 tablet (20 mg total) by mouth daily.   meclizine 25 MG tablet Commonly known as: ANTIVERT TAKE 1 TABLET BY MOUTH DAILY AS NEEDED FOR DIZZINESS.   metroNIDAZOLE 500 MG tablet Commonly known as: FLAGYL Take 1 tablet (500 mg total) by mouth 2 (two) times daily. Started by: Dontravious Camille   OXYGEN Inhale 2 L into the lungs at bedtime. Can use in the morning as needed   Psyllium 30.9 % Powd Drink 1 tablespoon dissolved in wate twice a day Started by: Tyan Dy   QUEtiapine 25 MG tablet Commonly known as: SEROQUEL Take 1 tablet (25 mg total) by mouth at bedtime.   rosuvastatin 20 MG tablet Commonly known as: Crestor Take 1 tablet (20 mg total) by mouth daily.   traMADol 50 MG tablet Commonly known as:  ULTRAM Take 1 tablet (50 mg total) by mouth every 12 (twelve) hours as needed for moderate pain.   Trelegy Ellipta 100-62.5-25 MCG/ACT Aepb Generic drug: Fluticasone-Umeclidin-Vilant Inhale 1 puff into the lungs daily in the afternoon.   triamterene-hydrochlorothiazide 37.5-25 MG tablet Commonly known as: MAXZIDE-25 Take 1 tablet by mouth daily. For blood pressure and fluid   vitamin C 100 MG tablet Take 100 mg by mouth daily.   Vitamin D 50 MCG (2000 UT) tablet Take 1 tablet (2,000 Units total) by mouth daily.         Follow-up: Return in about 1 month (around 03/15/2023).  Mechele Claude, M.D.

## 2023-02-15 ENCOUNTER — Ambulatory Visit (HOSPITAL_COMMUNITY): Payer: 59

## 2023-02-20 NOTE — Progress Notes (Signed)
Cardiology Office Note    Date:  03/04/2023  ID:  [name], DOB Aug 01, 1935, MRN [id number] Cardiologist: Charlton Haws, MD    History of Present Illness:    [name] is a 87 y.o. female with past medical history of CAD (s/p DES to mid-LAD and DES to mid-OM1 in 11/2016, mild peri-infarct ischemia by Myovue in 07/2021 and medical management pursued), HFrEF (EF 40-45% in 2018, at 45% by repeat echo in 12/2018 and 35-40% in 05/2021 and 01/2022), HTN, HLD, RA, dilation of ascending aorta (at 4.2 cm by imaging in 2019 and 4.3 cm in 01/2022) and COPD who presents to the office today for  follow-up.  Bradycardia Coreg dose reduced Her monitor  09/02/22 showed predominantly normal sinus rhythm with one episode of NSVT for 16 beats. Continued medical therapy recommended . She was also noted to have brief SVT with the longest being 20 beats but no significant bradycardia.  She has been under increased stress as she did have a family member staying with her for a significant period of time. She has not been taking her statin regularly as she experienced cramps with this every time she would initiate the medication.   Diverticulitis Rx with Augmentin not helpful and changed to Cipro and Metronidazole by primary 02/12/23  To see GI and likely needs EGD  Studies Reviewed:   EKG: EKG is not ordered today.  Echocardiogram: 01/2022 IMPRESSIONS     1. Left ventricular ejection fraction, by estimation, is 35 to 40%. The  left ventricle has moderately decreased function. There is global  hypokinesis that appears slightly worse in the basal-to-mid inferior and  basal-to-mid inferoseptal segments. There  is mild concentric left ventricular hypertrophy. Indeterminate diastolic  filling due to E-A fusion. There is septal-lateral dyssynchrony in the  setting of a LBBB.   2. Right ventricular systolic function is normal. The right ventricular  size is normal. There is normal pulmonary artery  systolic pressure. The  estimated right ventricular systolic pressure is 20.8 mmHg.   3. Moderate pericardial effusion. The pericardial effusion is localized  near the right ventricle and measures 1.6cm at max diameter. There is no  evidence of cardiac tamponade. Compared to prior TTE, the effusion appears  slightly larger on current study  but still within moderate range. Recommend serial monitoring with TTEs.   4. The mitral valve is normal in structure. Trivial mitral valve  regurgitation.   5. The aortic valve was not well visualized. There is mild calcification  of the aortic valve. There is mild thickening of the aortic valve. Aortic  valve regurgitation is trivial. Aortic valve sclerosis/calcification is  present, without any evidence of  aortic stenosis. Aortic valve mean gradient measures 5.0 mmHg.   6. The inferior vena cava is normal in size with greater than 50%  respiratory variability, suggesting right atrial pressure of 3 mmHg.   7. Left atrial size was mildly dilated.   Comparison(s): Compared to prior TTE, there is no significant change. EF  remains stable at 35-40%. There continues to be a moderate pericardial  effusion that is slightly larger on current study. Recommend continued  monitoring as outpatient.    Event Monitor: 08/2022 Patch Wear Time:  13 days and 23 hours (2024-03-20T14:38:42-0400 to 2024-04-03T14:38:34-0400)   Patient had a min HR of 49 bpm, max HR of 197 bpm, and avg HR of 75 bpm. Predominant underlying rhythm was Sinus Rhythm. Bundle Branch Block/IVCD was present. 11 Ventricular Tachycardia runs occurred, the  run with the fastest interval lasting 16 beats  with a max rate of 197 bpm (avg 153 bpm); the run with the fastest interval was also the longest. 8 Supraventricular Tachycardia runs occurred, the run with the fastest interval lasting 7 beats with a max rate of 160 bpm, the longest lasting 20 beats  with an avg rate of 126 bpm. Isolated SVEs were  rare (<1.0%), SVE Couplets were rare (<1.0%), and SVE Triplets were rare (<1.0%). Isolated VEs were occasional (3.6%, 53407), VE Couplets were rare (<1.0%, 3087), and VE Triplets were rare (<1.0%, 497).  Ventricular Bigeminy and Trigeminy were present.  Physical Exam:   VS:  BP 130/72 (BP Location: Right Arm, Patient Position: Sitting, Cuff Size: Normal)   Pulse 87   Ht 5\' 2"  (1.575 m)   Wt 164 lb (74.4 kg)   SpO2 95%   BMI 30.00 kg/m    Wt Readings from Last 3 Encounters:  03/04/23 164 lb (74.4 kg)  02/27/23 165 lb (74.8 kg)  02/12/23 165 lb 6.4 oz (75 kg)     Affect appropriate Healthy:  appears stated age HEENT: normal Neck supple with no adenopathy JVP normal no bruits no thyromegaly Lungs clear with no wheezing and good diaphragmatic motion Heart:  S1/S2 no murmur, no rub, gallop or click PMI normal Abdomen: benighn, BS positve, no tenderness, no AAA no bruit.  No HSM or HJR Distal pulses intact with no bruits No edema Neuro non-focal Skin warm and dry No muscular weakness    Assessment and Plan:   1. CAD - She is s/p DES to mid-LAD and DES to mid-OM1 in 11/2016 with NST in 07/2021 showing mild peri-infarct ischemia and medical management pursued.  - Stable continue ASA, Imdur , coreg and crestor  2. HFrEF - Echocardiogram in 01/2022 showed her EF was at 35 to 40% and she has been continued on Coreg, Entresto and Imdur. She has not been on an SGLT2 inhibitor given her history of frequent UTI's.  - She appears euvolemic she defers aldactone  - Will continue Coreg and Entresto at current dosing Imdur increased on visit  08/01/22   3. Dilation of Ascending Aorta - This measured 4.3 cm by imaging in 01/2022. Reviewed with the patient today F/U gated chest CTA ordered   4. History of Presyncope - Prior monitor as discussed above showed episodes of VT with the longest lasting for 16 beats and she did have PVC's with a 3.6% burden. Findings were reviewed  recommended  medical therapy at that time. She denies any recurrent presyncopal episodes. Remains on Coreg 3.125 mg twice daily given intermittent palpitations (had bradycardia with higher dosing).   5. HLD - Her LDL was significantly elevated at 144 Intolerant to lipitor Now on crestor update labs  6. Diverticulitis:  mild sigmoid on CT 02/03/23 Rx multiple rounds antibiotics Beware of C diff. F/U GI consider EGD   F/U in a year   Signed, Charlton Haws, MD

## 2023-02-27 ENCOUNTER — Inpatient Hospital Stay (HOSPITAL_COMMUNITY)
Admission: EM | Admit: 2023-02-27 | Discharge: 2023-03-03 | DRG: 392 | Disposition: A | Discharge status: Home / Self Care | Payer: 59 | Attending: Internal Medicine | Admitting: Internal Medicine

## 2023-02-27 ENCOUNTER — Ambulatory Visit: Payer: 59 | Admitting: Family Medicine

## 2023-02-27 ENCOUNTER — Encounter (HOSPITAL_COMMUNITY): Payer: Self-pay

## 2023-02-27 ENCOUNTER — Emergency Department (HOSPITAL_COMMUNITY): Payer: 59

## 2023-02-27 DIAGNOSIS — Z888 Allergy status to other drugs, medicaments and biological substances status: Secondary | ICD-10-CM | POA: Diagnosis not present

## 2023-02-27 DIAGNOSIS — F419 Anxiety disorder, unspecified: Secondary | ICD-10-CM | POA: Diagnosis present

## 2023-02-27 DIAGNOSIS — Z885 Allergy status to narcotic agent status: Secondary | ICD-10-CM

## 2023-02-27 DIAGNOSIS — Z7982 Long term (current) use of aspirin: Secondary | ICD-10-CM

## 2023-02-27 DIAGNOSIS — Z6830 Body mass index (BMI) 30.0-30.9, adult: Secondary | ICD-10-CM

## 2023-02-27 DIAGNOSIS — I252 Old myocardial infarction: Secondary | ICD-10-CM

## 2023-02-27 DIAGNOSIS — I5022 Chronic systolic (congestive) heart failure: Secondary | ICD-10-CM | POA: Diagnosis present

## 2023-02-27 DIAGNOSIS — Z87891 Personal history of nicotine dependence: Secondary | ICD-10-CM

## 2023-02-27 DIAGNOSIS — I10 Essential (primary) hypertension: Secondary | ICD-10-CM | POA: Diagnosis not present

## 2023-02-27 DIAGNOSIS — K5732 Diverticulitis of large intestine without perforation or abscess without bleeding: Secondary | ICD-10-CM | POA: Diagnosis present

## 2023-02-27 DIAGNOSIS — Z8249 Family history of ischemic heart disease and other diseases of the circulatory system: Secondary | ICD-10-CM | POA: Diagnosis not present

## 2023-02-27 DIAGNOSIS — Z7983 Long term (current) use of bisphosphonates: Secondary | ICD-10-CM

## 2023-02-27 DIAGNOSIS — E669 Obesity, unspecified: Secondary | ICD-10-CM | POA: Diagnosis present

## 2023-02-27 DIAGNOSIS — J449 Chronic obstructive pulmonary disease, unspecified: Secondary | ICD-10-CM | POA: Diagnosis present

## 2023-02-27 DIAGNOSIS — J9611 Chronic respiratory failure with hypoxia: Secondary | ICD-10-CM | POA: Diagnosis present

## 2023-02-27 DIAGNOSIS — K5792 Diverticulitis of intestine, part unspecified, without perforation or abscess without bleeding: Secondary | ICD-10-CM | POA: Diagnosis not present

## 2023-02-27 DIAGNOSIS — I110 Hypertensive heart disease with heart failure: Secondary | ICD-10-CM | POA: Diagnosis present

## 2023-02-27 DIAGNOSIS — I428 Other cardiomyopathies: Secondary | ICD-10-CM | POA: Diagnosis present

## 2023-02-27 DIAGNOSIS — I2510 Atherosclerotic heart disease of native coronary artery without angina pectoris: Secondary | ICD-10-CM | POA: Diagnosis present

## 2023-02-27 DIAGNOSIS — E876 Hypokalemia: Secondary | ICD-10-CM | POA: Diagnosis not present

## 2023-02-27 DIAGNOSIS — E782 Mixed hyperlipidemia: Secondary | ICD-10-CM | POA: Diagnosis present

## 2023-02-27 DIAGNOSIS — Z955 Presence of coronary angioplasty implant and graft: Secondary | ICD-10-CM | POA: Diagnosis not present

## 2023-02-27 DIAGNOSIS — Z833 Family history of diabetes mellitus: Secondary | ICD-10-CM

## 2023-02-27 DIAGNOSIS — R1030 Lower abdominal pain, unspecified: Secondary | ICD-10-CM | POA: Diagnosis not present

## 2023-02-27 DIAGNOSIS — Z79899 Other long term (current) drug therapy: Secondary | ICD-10-CM

## 2023-02-27 LAB — URINALYSIS, ROUTINE W REFLEX MICROSCOPIC
Bacteria, UA: NONE SEEN
Bilirubin Urine: NEGATIVE
Glucose, UA: NEGATIVE mg/dL
Hgb urine dipstick: NEGATIVE
Ketones, ur: NEGATIVE mg/dL
Nitrite: NEGATIVE
Protein, ur: NEGATIVE mg/dL
Specific Gravity, Urine: 1.003 — ABNORMAL LOW (ref 1.005–1.030)
pH: 7 (ref 5.0–8.0)

## 2023-02-27 LAB — COMPREHENSIVE METABOLIC PANEL
ALT: 14 U/L (ref 0–44)
AST: 19 U/L (ref 15–41)
Albumin: 4 g/dL (ref 3.5–5.0)
Alkaline Phosphatase: 40 U/L (ref 38–126)
Anion gap: 11 (ref 5–15)
BUN: 10 mg/dL (ref 8–23)
CO2: 27 mmol/L (ref 22–32)
Calcium: 10 mg/dL (ref 8.9–10.3)
Chloride: 101 mmol/L (ref 98–111)
Creatinine, Ser: 0.62 mg/dL (ref 0.44–1.00)
GFR, Estimated: >60 mL/min (ref >60)
Glucose, Bld: 108 mg/dL — ABNORMAL HIGH (ref 70–99)
Potassium: 4.2 mmol/L (ref 3.5–5.1)
Sodium: 139 mmol/L (ref 135–145)
Total Bilirubin: 0.6 mg/dL (ref 0.3–1.2)
Total Protein: 6.6 g/dL (ref 6.5–8.1)

## 2023-02-27 LAB — CBC
HCT: 37.6 % (ref 36.0–46.0)
Hemoglobin: 12 g/dL (ref 12.0–15.0)
MCH: 27.2 pg (ref 26.0–34.0)
MCHC: 31.9 g/dL (ref 30.0–36.0)
MCV: 85.3 fL (ref 80.0–100.0)
Platelets: 314 10*3/uL (ref 150–400)
RBC: 4.41 MIL/uL (ref 3.87–5.11)
RDW: 14.6 % (ref 11.5–15.5)
WBC: 8.6 10*3/uL (ref 4.0–10.5)
nRBC: 0 % (ref 0.0–0.2)

## 2023-02-27 LAB — LIPASE, BLOOD: Lipase: 29 U/L (ref 11–51)

## 2023-02-27 MED ORDER — IOHEXOL 300 MG/ML  SOLN
100.0000 mL | Freq: Once | INTRAMUSCULAR | Status: AC | PRN
  Administered 2023-02-27: 100 mL via INTRAVENOUS

## 2023-02-27 MED ORDER — CIPROFLOXACIN IN D5W 400 MG/200ML IV SOLN
400.0000 mg | Freq: Once | INTRAVENOUS | Status: AC
  Administered 2023-02-27: 400 mg via INTRAVENOUS
  Filled 2023-02-27: qty 200

## 2023-02-27 MED ORDER — METRONIDAZOLE 500 MG/100ML IV SOLN
500.0000 mg | Freq: Two times a day (BID) | INTRAVENOUS | Status: DC
  Administered 2023-02-28: 500 mg via INTRAVENOUS
  Filled 2023-02-27: qty 100

## 2023-02-27 NOTE — H&P (Signed)
History and Physical    Patient: [name] XBM:[id number] DOB: [birth year] DOA: 02/27/2023 DOS: the patient was seen and examined on 02/28/2023 PCP: Mechele Claude, MD  Patient coming from: Home  Chief Complaint:  Chief Complaint  Patient presents with   Abdominal Pain   Diarrhea   HPI: [name] is a 87 y.o. female with medical history significant of HFrEF (echo in 9/23 showed LVEF of 35 to 40%), chronic respiratory failure on supplemental oxygen at 2 LPM (nightly only, COPD, CAD s/p stent placement, essential hypertension, hyperlipidemia who presents to the emergency department due to 28-month history of lower abdominal pain and diarrhea. Patient was seen in September due to bilateral lower quadrant abdominal pain with blood in stool, CT abdomen and pelvis with contrast done at that time showed sigmoid diverticulitis and patient has since completed full course of antibiotic (ciprofloxacin and metronidazole) per ED medical record.  She endorsed improvement in left lower quadrant pain, however, right lower quadrant pain has since worsened and she complained of still having blood in stool (watery) which is intermittently black. She was admitted from 9/13 to 9/15 due to near syncope which was evaluated by cardiologist in which echo performed showed moderate pericardial effusion slightly larger from previous testing but stable and we need outpatient follow-up.  ED Course:  In the emergency department, BP was 140/68, other vital signs were within normal range..  Workup in the ED showed normal CBC, BMP was normal except for blood glucose of 108, urinalysis was normal. CT abdomen and pelvis with contrast showed mild stranding about the diverticulum and distal descending colon compatible with diverticulitis.  No abscess or perforation. Patient was treated with IV ciprofloxacin and metronidazole.  Hospitalist was asked to admit patient for further evaluation and management.  Review of  Systems: Review of systems as noted in the HPI. All other systems reviewed and are negative.   Past Medical History:  Diagnosis Date   Acute on chronic respiratory failure with hypoxia (HCC) 04/24/2018   Anxiety    Atypical chest pain    Bronchospasm    CAD (coronary artery disease)    a. s/p DES to mid-LAD and DES to mid-OM1 in 11/2016   Cervical disc disorder with myelopathy, unspecified cervical region    Cervicalgia 01/19/2009   Qualifier: Diagnosis of  By: Romeo Apple MD, Stanley     Chest pain at rest 01/27/2015   Chronic systolic (congestive) heart failure (HCC)    COPD (chronic obstructive pulmonary disease) (HCC)    Coronary artery disease    De Quervain's disease (tenosynovitis) 04/02/2011   Disc disease with myelopathy, cervical    Essential hypertension    Hemorrhoids    Liver mass    Lung, cysts, congenital    Left lung cyst   Myocardial infarction (HCC)    Nephrolithiasis    Embedded   Nonischemic cardiomyopathy (HCC)    LVEF 35-40% 2015   On home O2    Osteoarthritis    Sprain of wrist 08/07/2012   Thoracic ascending aortic aneurysm (HCC)    4.3 cm April 2016   Past Surgical History:  Procedure Laterality Date   Benign breast tumors     CHOLECYSTECTOMY     COLONOSCOPY     COLONOSCOPY N/A 09/22/2014   Procedure: COLONOSCOPY;  Surgeon: Malissa Hippo, MD;  Location: [facility];  Service: Endoscopy;  Laterality: N/A;  830 -- to be done in OR under fluoro   Complete hysterectomy  CORONARY STENT INTERVENTION N/A 11/23/2016   Procedure: Coronary Stent Intervention;  Surgeon: Swaziland, Peter M, MD;  Location: [facility] INVASIVE CV LAB;  Service: Cardiovascular;  Laterality: N/A;   LEFT HEART CATH AND CORONARY ANGIOGRAPHY N/A 11/23/2016   Procedure: Left Heart Cath and Coronary Angiography;  Surgeon: Swaziland, Peter M, MD;  Location: [facility] INVASIVE CV LAB;  Service: Cardiovascular;  Laterality: N/A;   TONSILLECTOMY AND ADENOIDECTOMY      Social History:  reports that she  quit smoking about 14 years ago. Her smoking use included cigarettes. She started smoking about 45 years ago. She has a 7.8 pack-year smoking history. She has never used smokeless tobacco. She reports that she does not drink alcohol and does not use drugs.   Allergies  Allergen Reactions   Plavix [Clopidogrel Bisulfate] Itching    Severe itching   Calcium Channel Blockers Other (See Comments)    cramping   Alprazolam Nausea And Vomiting   Codeine Nausea And Vomiting   Percodan [Oxycodone-Aspirin] Nausea And Vomiting   Valium Nausea And Vomiting    Family History  Problem Relation Age of Onset   Heart disease Mother    Aneurysm Father    Lung cancer Brother    Heart disease Sister    Diabetes Brother    Heart disease Brother      Prior to Admission medications   Medication Sig Start Date End Date Taking? Authorizing Provider  albuterol (VENTOLIN HFA) 108 (90 Base) MCG/ACT inhaler Inhale 2 puffs into the lungs every 6 (six) hours as needed for wheezing or shortness of breath. 11/20/22   Coralyn Helling, MD  alendronate (FOSAMAX) 70 MG tablet Take 1 tablet (70 mg total) by mouth every 7 (seven) days. Take with a full glass of water on an empty stomach. Do not lay down for at least 2 hours 10/22/22   Mechele Claude, MD  Ascorbic Acid (VITAMIN C) 100 MG tablet Take 100 mg by mouth daily.    [provider]  aspirin EC 81 MG tablet Take 81 mg by mouth every morning.     [provider]  carvedilol (COREG) 3.125 MG tablet Take 1 tablet (3.125 mg total) by mouth 2 (two) times daily with a meal. 07/30/22   Wendall Stade, MD  celecoxib (CELEBREX) 200 MG capsule TAKE ONE CAPSULE BY MOUTH ONCE DAILY WITH FOOD. 01/01/23   Mechele Claude, MD  Cholecalciferol (VITAMIN D) 50 MCG (2000 UT) tablet Take 1 tablet (2,000 Units total) by mouth daily. 10/22/22   Mechele Claude, MD  ciprofloxacin (CIPRO) 500 MG tablet Take 1 tablet (500 mg total) by mouth 2 (two) times daily. 02/12/23   Mechele Claude, MD  clorazepate (TRANXENE) 7.5 MG tablet Take 1 tablet (7.5 mg total) by mouth daily as needed for anxiety. 10/22/22   Mechele Claude, MD  Cranberry 500 MG TABS Take 1 tablet by mouth daily.    [provider]  Fluticasone-Umeclidin-Vilant (TRELEGY ELLIPTA) 100-62.5-25 MCG/ACT AEPB Inhale 1 puff into the lungs daily in the afternoon. 11/20/22   Coralyn Helling, MD  isosorbide mononitrate (IMDUR) 30 MG 24 hr tablet Take 1 tablet (30 mg total) by mouth daily. 11/13/22   Strader, Lennart Pall, PA-C  leflunomide (ARAVA) 20 MG tablet Take 1 tablet (20 mg total) by mouth daily. 10/22/22   Mechele Claude, MD  meclizine (ANTIVERT) 25 MG tablet TAKE 1 TABLET BY MOUTH DAILY AS NEEDED FOR DIZZINESS. 05/21/22   Wendall Stade, MD  metroNIDAZOLE (FLAGYL) 500 MG tablet  Take 1 tablet (500 mg total) by mouth 2 (two) times daily. 02/12/23   Mechele Claude, MD  OXYGEN Inhale 2 L into the lungs at bedtime. Can use in the morning as needed    [provider]  Psyllium 30.9 % POWD Drink 1 tablespoon dissolved in wate twice a day 02/12/23   Mechele Claude, MD  QUEtiapine (SEROQUEL) 25 MG tablet Take 1 tablet (25 mg total) by mouth at bedtime. 10/22/22   Mechele Claude, MD  rosuvastatin (CRESTOR) 20 MG tablet Take 1 tablet (20 mg total) by mouth daily. 11/13/22   Strader, Lennart Pall, PA-C  sacubitril-valsartan (ENTRESTO) 24-26 MG TAKE 1 TABLET BY MOUTH TWICE DAILY. 01/22/23   Wendall Stade, MD  traMADol (ULTRAM) 50 MG tablet Take 1 tablet (50 mg total) by mouth every 12 (twelve) hours as needed for moderate pain. 10/22/22   Mechele Claude, MD  triamterene-hydrochlorothiazide (MAXZIDE-25) 37.5-25 MG tablet Take 1 tablet by mouth daily. For blood pressure and fluid 04/03/22   Mechele Claude, MD    Physical Exam: BP 121/60   Pulse 78   Temp 98.4 F (36.9 C) (Oral)   Resp 18   Ht 5\' 2"  (1.575 m)   Wt 74.8 kg   SpO2 92%   BMI 30.18 kg/m   General: 87 y.o. year-old female well developed well nourished in no  acute distress.  Alert and oriented x3. HEENT: NCAT, EOMI Neck: Supple, trachea medial Cardiovascular: Regular rate and rhythm with no rubs or gallops.  No thyromegaly or JVD noted.  No lower extremity edema. 2/4 pulses in all 4 extremities. Respiratory: Clear to auscultation with no wheezes or rales. Good inspiratory effort. Abdomen: Soft, tender to palpation of lower quadrants (RLQ  > LLQ) including the suprapubic area with no guarding.   Muskuloskeletal: No cyanosis, clubbing or edema noted bilaterally Neuro: CN II-XII intact, strength 5/5 x 4, sensation, reflexes intact Skin: No ulcerative lesions noted or rashes Psychiatry: Judgement and insight appear normal. Mood is appropriate for condition and setting          Labs on Admission:  Basic Metabolic Panel: Recent Labs  Lab 02/27/23 1156 02/28/23 0313  NA 139 136  K 4.2 3.4*  CL 101 101  CO2 27 26  GLUCOSE 108* 99  BUN 10 9  CREATININE 0.62 0.64  CALCIUM 10.0 9.4  MG  --  2.1  PHOS  --  3.0   Liver Function Tests: Recent Labs  Lab 02/27/23 1156 02/28/23 0313  AST 19 16  ALT 14 13  ALKPHOS 40 36*  BILITOT 0.6 0.9  PROT 6.6 6.0*  ALBUMIN 4.0 3.5   Recent Labs  Lab 02/27/23 1156  LIPASE 29   No results for input(s): "AMMONIA" in the last 168 hours. CBC: Recent Labs  Lab 02/27/23 1156 02/28/23 0313  WBC 8.6 6.7  HGB 12.0 11.6*  HCT 37.6 36.4  MCV 85.3 84.3  PLT 314 287   Cardiac Enzymes: No results for input(s): "CKTOTAL", "CKMB", "CKMBINDEX", "TROPONINI" in the last 168 hours.  BNP (last 3 results) No results for input(s): "BNP" in the last 8760 hours.  ProBNP (last 3 results) No results for input(s): "PROBNP" in the last 8760 hours.  CBG: No results for input(s): "GLUCAP" in the last 168 hours.  Radiological Exams on Admission: CT ABDOMEN PELVIS W CONTRAST  Result Date: 02/27/2023 CLINICAL DATA:  Left lower quadrant abdominal pain, right lower quadrant and diarrhea. History of  diverticulitis. EXAM: CT ABDOMEN AND PELVIS WITH  CONTRAST TECHNIQUE: Multidetector CT imaging of the abdomen and pelvis was performed using the standard protocol following bolus administration of intravenous contrast. RADIATION DOSE REDUCTION: This exam was performed according to the departmental dose-optimization program which includes automated exposure control, adjustment of the mA and/or kV according to patient size and/or use of iterative reconstruction technique. CONTRAST:  OMNIPAQUE IOHEXOL 300 MG/ML  SOLN COMPARISON:  CT abdomen and pelvis 02/03/2023 FINDINGS: Lower chest: No acute abnormality. Hepatobiliary: Cholecystectomy Buttock cysts/hemangiomas. Prominent bile duct due to reservoir effect. Pancreas: Unremarkable. Spleen: Unremarkable. Adrenals/Urinary Tract: Stable adrenal glands and kidneys. No urinary calculi or hydronephrosis. Cystocele of the bladder. Stomach/Bowel: Normal caliber large and small bowel. Colonic diverticulosis. Mild stranding about diverticulum distal descending colon (series 4/image 30). No abscess or perforation. The appendix is not definitively visualized. No secondary signs of appendicitis. Small hiatal. Vascular/Lymphatic: Aortic atherosclerosis. No enlarged abdominal or pelvic lymph nodes. Reproductive: Hysterectomy. Other: No free intraperitoneal fluid or air. Musculoskeletal: No acute fracture IMPRESSION: 1. Mild stranding about a diverticulum in distal descending colon compatible with diverticulitis. No abscess or perforation. 2. Pelvic floor laxity with cystocele of the bladder. Electronically Signed   By: Minerva Fester M.D.   On: 02/27/2023 22:52    EKG: I independently viewed the EKG done and my findings are as followed: EKG was not done in the ED  Assessment/Plan Present on Admission:  Acute diverticulitis  Essential hypertension  CAD (coronary artery disease)  COPD (chronic obstructive pulmonary disease) (HCC)  Mixed hyperlipidemia  Obesity (BMI  30-39.9)  Principal Problem:   Acute diverticulitis Active Problems:   Essential hypertension   COPD (chronic obstructive pulmonary disease) (HCC)   CAD (coronary artery disease)   Mixed hyperlipidemia   Obesity (BMI 30-39.9)   Chronic HFrEF (heart failure with reduced ejection fraction) (HCC)  Acute Diverticulitis CT abdomen and pelvis was suggestive of diverticulitis Patient was treated with ciprofloxacin and Flagyl as an outpatient without improvement, she was treated with IV ciprofloxacin and Flagyl in the ED.  Due to prior failure to respond to these antibiotics, patient will be treated with Zosyn Continue IV Dilaudid 0.5 mg q.4h p.r.n. for moderate to severe pain Continue IV Zofran p.r.n. Continue clear liquid diet with plan to advanced diet as tolerated Obtain blood culture x2  Essential hypertension Continue carvedilol  CAD (coronary artery disease) Continue aspirin, Imdur, carvedilol  Chronic HFrEF Echo in 9/23 showed LVEF of 35 to 40%) Aspirin, Coreg, Crestor, Entresto   COPD (chronic obstructive pulmonary disease) (HCC) Continue albuterol, Breo and Incruse Ellipta  Mixed hyperlipidemia Continue Crestor  Obesity (BMI 30.18) Continue diet and lifestyle modification  DVT prophylaxis: Lovenox  Advance Care Planning:   Code Status: Full Code   Consults: None  Family Communication: None at bedside  Severity of Illness: The appropriate patient status for this patient is INPATIENT. Inpatient status is judged to be reasonable and necessary in order to provide the required intensity of service to ensure the patient's safety. The patient's presenting symptoms, physical exam findings, and initial radiographic and laboratory data in the context of their chronic comorbidities is felt to place them at high risk for further clinical deterioration. Furthermore, it is not anticipated that the patient will be medically stable for discharge from the hospital within 2 midnights  of admission.   * I certify that at the point of admission it is my clinical judgment that the patient will require inpatient hospital care spanning beyond 2 midnights from the point of admission due to high  intensity of service, high risk for further deterioration and high frequency of surveillance required.*  Author: Frankey Shown, DO 02/28/2023 6:50 AM  For on call review [email or web address].

## 2023-02-27 NOTE — ED Notes (Signed)
Pt reports that she has taken 3 complete courses of antibiotics with no relief of her pain. She produced 2 empty bottles. 1 of flagyl and 1 of cipro.

## 2023-02-27 NOTE — ED Triage Notes (Signed)
Pt c/o lower abdominal pain and diarrhea x3 months.  Pt was seen the end of September for the same and has completed prescribed medications.  Sts symptoms have not gone away.  Hx of diverticulitis.

## 2023-02-27 NOTE — ED Provider Notes (Addendum)
[facility] AT [facility] Provider Note   CSN: [id number] Arrival date & time: 02/27/23  1113     History  Chief Complaint  Patient presents with   Abdominal Pain   Diarrhea    [name] is a 87 y.o. female.  Patient is a 87 yo female presenting for abdominal pain. Patient seen in September for RLQ and LLQ abdominal pain with hematochezia. At this time she had positive CT abd/pelvis with contrast for sigmoid diverticulitis. Since, then she has completed full course of antibiotic metronidzaole and ciprofloxacin. Since then, patient has had improvement of LLQ abdominal pain but worsening or RLQ abdominal pain. Still having hematochezia. Admits to "soup-like" loose stools that are intermittently black.   The history is provided by the patient. No language interpreter was used.  Abdominal Pain Associated symptoms: diarrhea   Associated symptoms: no chest pain, no chills, no cough, no dysuria, no fever, no hematuria, no shortness of breath, no sore throat and no vomiting   Diarrhea Associated symptoms: abdominal pain   Associated symptoms: no arthralgias, no chills, no fever and no vomiting        Home Medications Prior to Admission medications   Medication Sig Start Date End Date Taking? Authorizing Provider  albuterol (VENTOLIN HFA) 108 (90 Base) MCG/ACT inhaler Inhale 2 puffs into the lungs every 6 (six) hours as needed for wheezing or shortness of breath. 11/20/22   Coralyn Helling, MD  alendronate (FOSAMAX) 70 MG tablet Take 1 tablet (70 mg total) by mouth every 7 (seven) days. Take with a full glass of water on an empty stomach. Do not lay down for at least 2 hours 10/22/22   Mechele Claude, MD  Ascorbic Acid (VITAMIN C) 100 MG tablet Take 100 mg by mouth daily.    [provider]  aspirin EC 81 MG tablet Take 81 mg by mouth every morning.     [provider]  carvedilol (COREG) 3.125 MG tablet Take 1 tablet (3.125 mg total) by  mouth 2 (two) times daily with a meal. 07/30/22   Wendall Stade, MD  celecoxib (CELEBREX) 200 MG capsule TAKE ONE CAPSULE BY MOUTH ONCE DAILY WITH FOOD. 01/01/23   Mechele Claude, MD  Cholecalciferol (VITAMIN D) 50 MCG (2000 UT) tablet Take 1 tablet (2,000 Units total) by mouth daily. 10/22/22   Mechele Claude, MD  ciprofloxacin (CIPRO) 500 MG tablet Take 1 tablet (500 mg total) by mouth 2 (two) times daily. 02/12/23   Mechele Claude, MD  clorazepate (TRANXENE) 7.5 MG tablet Take 1 tablet (7.5 mg total) by mouth daily as needed for anxiety. 10/22/22   Mechele Claude, MD  Cranberry 500 MG TABS Take 1 tablet by mouth daily.    [provider]  Fluticasone-Umeclidin-Vilant (TRELEGY ELLIPTA) 100-62.5-25 MCG/ACT AEPB Inhale 1 puff into the lungs daily in the afternoon. 11/20/22   Coralyn Helling, MD  isosorbide mononitrate (IMDUR) 30 MG 24 hr tablet Take 1 tablet (30 mg total) by mouth daily. 11/13/22   Strader, Lennart Pall, PA-C  leflunomide (ARAVA) 20 MG tablet Take 1 tablet (20 mg total) by mouth daily. 10/22/22   Mechele Claude, MD  meclizine (ANTIVERT) 25 MG tablet TAKE 1 TABLET BY MOUTH DAILY AS NEEDED FOR DIZZINESS. 05/21/22   Wendall Stade, MD  metroNIDAZOLE (FLAGYL) 500 MG tablet Take 1 tablet (500 mg total) by mouth 2 (two) times daily. 02/12/23   Mechele Claude, MD  OXYGEN Inhale 2 L into the lungs at  bedtime. Can use in the morning as needed    [provider]  Psyllium 30.9 % POWD Drink 1 tablespoon dissolved in wate twice a day 02/12/23   Mechele Claude, MD  QUEtiapine (SEROQUEL) 25 MG tablet Take 1 tablet (25 mg total) by mouth at bedtime. 10/22/22   Mechele Claude, MD  rosuvastatin (CRESTOR) 20 MG tablet Take 1 tablet (20 mg total) by mouth daily. 11/13/22   Strader, Lennart Pall, PA-C  sacubitril-valsartan (ENTRESTO) 24-26 MG TAKE 1 TABLET BY MOUTH TWICE DAILY. 01/22/23   Wendall Stade, MD  traMADol (ULTRAM) 50 MG tablet Take 1 tablet (50 mg total) by mouth every 12 (twelve) hours as  needed for moderate pain. 10/22/22   Mechele Claude, MD  triamterene-hydrochlorothiazide (MAXZIDE-25) 37.5-25 MG tablet Take 1 tablet by mouth daily. For blood pressure and fluid 04/03/22   Mechele Claude, MD      Allergies    Plavix [clopidogrel bisulfate], Calcium channel blockers, Alprazolam, Codeine, Percodan [oxycodone-aspirin], and Valium    Review of Systems   Review of Systems  Constitutional:  Negative for chills and fever.  HENT:  Negative for ear pain and sore throat.   Eyes:  Negative for pain and visual disturbance.  Respiratory:  Negative for cough and shortness of breath.   Cardiovascular:  Negative for chest pain and palpitations.  Gastrointestinal:  Positive for abdominal pain, blood in stool and diarrhea. Negative for vomiting.  Genitourinary:  Negative for dysuria and hematuria.  Musculoskeletal:  Negative for arthralgias and back pain.  Skin:  Negative for color change and rash.  Neurological:  Negative for seizures and syncope.  All other systems reviewed and are negative.   Physical Exam Updated Vital Signs BP 124/76   Pulse 97   Temp 98.2 F (36.8 C)   Resp 18   Ht 5\' 2"  (1.575 m)   Wt 74.8 kg   SpO2 95%   BMI 30.18 kg/m  Physical Exam Vitals and nursing note reviewed.  Constitutional:      General: She is not in acute distress.    Appearance: She is well-developed.  HENT:     Head: Normocephalic and atraumatic.  Eyes:     Conjunctiva/sclera: Conjunctivae normal.  Cardiovascular:     Rate and Rhythm: Normal rate and regular rhythm.     Heart sounds: No murmur heard. Pulmonary:     Effort: Pulmonary effort is normal. No respiratory distress.     Breath sounds: Normal breath sounds.  Abdominal:     Palpations: Abdomen is soft.     Tenderness: There is abdominal tenderness in the right lower quadrant and suprapubic area. There is no guarding or rebound.  Musculoskeletal:        General: No swelling.     Cervical back: Neck supple.  Skin:     General: Skin is warm and dry.     Capillary Refill: Capillary refill takes less than 2 seconds.  Neurological:     Mental Status: She is alert.  Psychiatric:        Mood and Affect: Mood normal.     ED Results / Procedures / Treatments   Labs (all labs ordered are listed, but only abnormal results are displayed) Labs Reviewed  COMPREHENSIVE METABOLIC PANEL - Abnormal; Notable for the following components:      Result Value   Glucose, Bld 108 (*)    All other components within normal limits  URINALYSIS, ROUTINE W REFLEX MICROSCOPIC - Abnormal; Notable for the following components:  Color, Urine STRAW (*)    Specific Gravity, Urine 1.003 (*)    Leukocytes,Ua TRACE (*)    All other components within normal limits  C DIFFICILE QUICK SCREEN W PCR REFLEX    LIPASE, BLOOD  CBC    EKG None  Radiology CT ABDOMEN PELVIS W CONTRAST  Result Date: 02/27/2023 CLINICAL DATA:  Left lower quadrant abdominal pain, right lower quadrant and diarrhea. History of diverticulitis. EXAM: CT ABDOMEN AND PELVIS WITH CONTRAST TECHNIQUE: Multidetector CT imaging of the abdomen and pelvis was performed using the standard protocol following bolus administration of intravenous contrast. RADIATION DOSE REDUCTION: This exam was performed according to the departmental dose-optimization program which includes automated exposure control, adjustment of the mA and/or kV according to patient size and/or use of iterative reconstruction technique. CONTRAST:  OMNIPAQUE IOHEXOL 300 MG/ML  SOLN COMPARISON:  CT abdomen and pelvis 02/03/2023 FINDINGS: Lower chest: No acute abnormality. Hepatobiliary: Cholecystectomy Buttock cysts/hemangiomas. Prominent bile duct due to reservoir effect. Pancreas: Unremarkable. Spleen: Unremarkable. Adrenals/Urinary Tract: Stable adrenal glands and kidneys. No urinary calculi or hydronephrosis. Cystocele of the bladder. Stomach/Bowel: Normal caliber large and small bowel. Colonic  diverticulosis. Mild stranding about diverticulum distal descending colon (series 4/image 30). No abscess or perforation. The appendix is not definitively visualized. No secondary signs of appendicitis. Small hiatal. Vascular/Lymphatic: Aortic atherosclerosis. No enlarged abdominal or pelvic lymph nodes. Reproductive: Hysterectomy. Other: No free intraperitoneal fluid or air. Musculoskeletal: No acute fracture IMPRESSION: 1. Mild stranding about a diverticulum in distal descending colon compatible with diverticulitis. No abscess or perforation. 2. Pelvic floor laxity with cystocele of the bladder. Electronically Signed   By: Minerva Fester M.D.   On: 02/27/2023 22:52    Procedures Procedures    Medications Ordered in ED Medications  ciprofloxacin (CIPRO) IVPB 400 mg (has no administration in time range)  metroNIDAZOLE (FLAGYL) IVPB 500 mg (has no administration in time range)  iohexol (OMNIPAQUE) 300 MG/ML solution 100 mL (100 mLs Intravenous Contrast Given 02/27/23 1947)    ED Course/ Medical Decision Making/ A&P                                 Medical Decision Making Amount and/or Complexity of Data Reviewed Labs: ordered. Radiology: ordered.  Risk Prescription drug management. Decision regarding hospitalization.   87 year old female previously treated for diverticulitis presenting for continued lower abdominal pain with new radiation to the right side.  History of appendectomy.  Continued hematochezia.  Completed dose of ciprofloxacin and Flagyl.  On exam patient is alert and oriented x 3, no acute distress, afebrile, stable vital signs.  Abdomen is soft with no guarding or rigidity.  Tenderness to palpation in the right and left lower quadrants.  Medications given for pain control.  CT scan demonstrates recurrent diverticulitis.  Recommending admission at this time for failed outpatient therapy.  Hemoglobin stable despite hematochezia.  Pending admission at this time due to failed  outpatient antibiotic therapy.  Patient agreeable to plan.  Otherwise no signs or symptoms of sepsis. Patient accepted by Dr. Thomes Dinning.     Final Clinical Impression(s) / ED Diagnoses Final diagnoses:  Diverticulitis    Rx / DC Orders ED Discharge Orders     None         Franne Forts, DO 02/27/23 2306    Franne Forts, DO 02/27/23 2334

## 2023-02-27 NOTE — ED Notes (Signed)
Gave a warm blanket and repositioned in the bed.

## 2023-02-27 NOTE — ED Provider Triage Note (Cosign Needed Addendum)
Emergency Medicine Provider Triage Evaluation Note  [name] , a 87 y.o. female  was evaluated in triage.  Pt complains of nausea without emesis this, persistent abdominal pain, suprapubic into the right lower quadrant which has not resolved despite 2 rounds of antibiotics since she was first diagnosed with diverticulitis last month.  No known fevers recently, she has been afraid to eat as it makes her pain worse, she last ate a cream soup yesterday.  She has been n.p.o. today, feels lightheaded. Diarrhea  Review of Systems  Positive: Abdominal pain, nausea, anorexia, diarrhea Negative: Vomiting,  fever  Physical Exam  BP (!) 120/106 (BP Location: Right Arm)   Pulse 84   Temp 98 F (36.7 C) (Oral)   Resp 16   Ht 5\' 2"  (1.575 m)   Wt 74.8 kg   SpO2 95%   BMI 30.18 kg/m  Gen:   Awake, no distress   Resp:  Normal effort  MSK:   Moves extremities without difficulty  Other:  Painful abd suprapubic to RLQ, no guarding  Medical Decision Making  Medically screening exam initiated at 1:04 PM.  Appropriate orders placed.  [name] was informed that the remainder of the evaluation will be completed by another provider, this initial triage assessment does not replace that evaluation, and the importance of remaining in the ED until their evaluation is complete.     Burgess Amor, PA-C 02/27/23 1306    Burgess Amor, PA-C 02/27/23 1309

## 2023-02-28 ENCOUNTER — Other Ambulatory Visit: Payer: Self-pay

## 2023-02-28 DIAGNOSIS — I5022 Chronic systolic (congestive) heart failure: Secondary | ICD-10-CM | POA: Diagnosis not present

## 2023-02-28 DIAGNOSIS — K5792 Diverticulitis of intestine, part unspecified, without perforation or abscess without bleeding: Secondary | ICD-10-CM | POA: Diagnosis not present

## 2023-02-28 DIAGNOSIS — E669 Obesity, unspecified: Secondary | ICD-10-CM | POA: Diagnosis not present

## 2023-02-28 LAB — CBC
HCT: 36.4 % (ref 36.0–46.0)
Hemoglobin: 11.6 g/dL — ABNORMAL LOW (ref 12.0–15.0)
MCH: 26.9 pg (ref 26.0–34.0)
MCHC: 31.9 g/dL (ref 30.0–36.0)
MCV: 84.3 fL (ref 80.0–100.0)
Platelets: 287 10*3/uL (ref 150–400)
RBC: 4.32 MIL/uL (ref 3.87–5.11)
RDW: 14.4 % (ref 11.5–15.5)
WBC: 6.7 10*3/uL (ref 4.0–10.5)
nRBC: 0 % (ref 0.0–0.2)

## 2023-02-28 LAB — COMPREHENSIVE METABOLIC PANEL
ALT: 13 U/L (ref 0–44)
AST: 16 U/L (ref 15–41)
Albumin: 3.5 g/dL (ref 3.5–5.0)
Alkaline Phosphatase: 36 U/L — ABNORMAL LOW (ref 38–126)
Anion gap: 9 (ref 5–15)
BUN: 9 mg/dL (ref 8–23)
CO2: 26 mmol/L (ref 22–32)
Calcium: 9.4 mg/dL (ref 8.9–10.3)
Chloride: 101 mmol/L (ref 98–111)
Creatinine, Ser: 0.64 mg/dL (ref 0.44–1.00)
GFR, Estimated: >60 mL/min (ref >60)
Glucose, Bld: 99 mg/dL (ref 70–99)
Potassium: 3.4 mmol/L — ABNORMAL LOW (ref 3.5–5.1)
Sodium: 136 mmol/L (ref 135–145)
Total Bilirubin: 0.9 mg/dL (ref 0.3–1.2)
Total Protein: 6 g/dL — ABNORMAL LOW (ref 6.5–8.1)

## 2023-02-28 LAB — C DIFFICILE QUICK SCREEN W PCR REFLEX
C Diff antigen: NEGATIVE
C Diff interpretation: NOT DETECTED
C Diff toxin: NEGATIVE

## 2023-02-28 LAB — PHOSPHORUS: Phosphorus: 3 mg/dL (ref 2.5–4.6)

## 2023-02-28 LAB — MAGNESIUM: Magnesium: 2.1 mg/dL (ref 1.7–2.4)

## 2023-02-28 MED ORDER — PIPERACILLIN-TAZOBACTAM 3.375 G IVPB
3.3750 g | Freq: Three times a day (TID) | INTRAVENOUS | Status: DC
  Administered 2023-02-28 – 2023-03-03 (×10): 3.375 g via INTRAVENOUS
  Filled 2023-02-28 (×10): qty 50

## 2023-02-28 MED ORDER — ENOXAPARIN SODIUM 40 MG/0.4ML IJ SOSY
40.0000 mg | PREFILLED_SYRINGE | INTRAMUSCULAR | Status: DC
  Administered 2023-02-28 – 2023-03-03 (×4): 40 mg via SUBCUTANEOUS
  Filled 2023-02-28 (×4): qty 0.4

## 2023-02-28 MED ORDER — HYDROMORPHONE HCL 1 MG/ML IJ SOLN
0.5000 mg | INTRAMUSCULAR | Status: DC | PRN
  Administered 2023-03-01 – 2023-03-02 (×2): 0.5 mg via INTRAVENOUS
  Filled 2023-02-28 (×2): qty 0.5

## 2023-02-28 MED ORDER — FLUTICASONE FUROATE-VILANTEROL 100-25 MCG/ACT IN AEPB
1.0000 | INHALATION_SPRAY | Freq: Every day | RESPIRATORY_TRACT | Status: DC
  Administered 2023-03-01 – 2023-03-03 (×3): 1 via RESPIRATORY_TRACT
  Filled 2023-02-28: qty 28

## 2023-02-28 MED ORDER — CARVEDILOL 3.125 MG PO TABS
3.1250 mg | ORAL_TABLET | Freq: Two times a day (BID) | ORAL | Status: DC
  Administered 2023-02-28 – 2023-03-03 (×6): 3.125 mg via ORAL
  Filled 2023-02-28 (×6): qty 1

## 2023-02-28 MED ORDER — CLORAZEPATE DIPOTASSIUM 7.5 MG PO TABS
7.5000 mg | ORAL_TABLET | Freq: Every day | ORAL | Status: DC | PRN
  Administered 2023-02-28: 7.5 mg via ORAL
  Filled 2023-02-28: qty 1

## 2023-02-28 MED ORDER — ISOSORBIDE MONONITRATE ER 30 MG PO TB24
30.0000 mg | ORAL_TABLET | Freq: Every day | ORAL | Status: DC
  Administered 2023-02-28 – 2023-03-03 (×4): 30 mg via ORAL
  Filled 2023-02-28 (×4): qty 1

## 2023-02-28 MED ORDER — UMECLIDINIUM BROMIDE 62.5 MCG/ACT IN AEPB
1.0000 | INHALATION_SPRAY | Freq: Every day | RESPIRATORY_TRACT | Status: DC
  Administered 2023-03-01 – 2023-03-03 (×3): 1 via RESPIRATORY_TRACT
  Filled 2023-02-28: qty 7

## 2023-02-28 MED ORDER — ASPIRIN 81 MG PO TBEC
81.0000 mg | DELAYED_RELEASE_TABLET | Freq: Every day | ORAL | Status: DC
  Administered 2023-02-28 – 2023-03-03 (×4): 81 mg via ORAL
  Filled 2023-02-28 (×4): qty 1

## 2023-02-28 MED ORDER — SACUBITRIL-VALSARTAN 24-26 MG PO TABS
1.0000 | ORAL_TABLET | Freq: Two times a day (BID) | ORAL | Status: DC
  Administered 2023-02-28 – 2023-03-03 (×6): 1 via ORAL
  Filled 2023-02-28 (×6): qty 1

## 2023-02-28 MED ORDER — ONDANSETRON HCL 4 MG PO TABS: 4.0000 mg | ORAL_TABLET | Freq: Four times a day (QID) | ORAL | Status: DC | PRN

## 2023-02-28 MED ORDER — POTASSIUM CHLORIDE 10 MEQ/100ML IV SOLN
10.0000 meq | INTRAVENOUS | Status: AC
  Administered 2023-02-28 (×2): 10 meq via INTRAVENOUS
  Filled 2023-02-28 (×2): qty 100

## 2023-02-28 MED ORDER — ACETAMINOPHEN 325 MG PO TABS: 650.0000 mg | ORAL_TABLET | Freq: Four times a day (QID) | ORAL | Status: DC | PRN

## 2023-02-28 MED ORDER — ROSUVASTATIN CALCIUM 20 MG PO TABS
20.0000 mg | ORAL_TABLET | Freq: Every day | ORAL | Status: DC
  Administered 2023-02-28 – 2023-03-03 (×4): 20 mg via ORAL
  Filled 2023-02-28 (×4): qty 1

## 2023-02-28 MED ORDER — ALBUTEROL SULFATE (2.5 MG/3ML) 0.083% IN NEBU: 3.0000 mL | INHALATION_SOLUTION | Freq: Four times a day (QID) | RESPIRATORY_TRACT | Status: DC | PRN

## 2023-02-28 MED ORDER — ACETAMINOPHEN 650 MG RE SUPP: 650.0000 mg | Freq: Four times a day (QID) | RECTAL | Status: DC | PRN

## 2023-02-28 MED ORDER — ONDANSETRON HCL 4 MG/2ML IJ SOLN
4.0000 mg | Freq: Four times a day (QID) | INTRAMUSCULAR | Status: DC | PRN
  Administered 2023-03-02: 4 mg via INTRAVENOUS
  Filled 2023-02-28: qty 2

## 2023-02-28 NOTE — ED Notes (Signed)
In to stop zoayn and pt red around site, has been scratching due to itching per pt. EDP aware. Pt states she is allergic to tape but was not red prior to zosyn given. Tape changed to paper tape. Nad. Sitting  in recliner

## 2023-02-28 NOTE — Hospital Course (Signed)
87 year old female with a history of coronary artery disease (s/p DES to mid-LAD and DES to mid-OM1 in 11/2016, HFrEF (EF 35-40%), hypertension, hyperlipidemia, dilation of ascending aorta (4.3 cm by imaging 01/2022), and COPD presenting with lower abdominal pain for the better part of a month.  Patient was initially seen in the ED on 02/03/2023 for LLQ abdominal pain.  CT of the abdomen and pelvis on the day showed mild sigmoid diverticulitis without perforation.  The patient was discharged home in stable condition with Augmentin.  She followed up with her PCP on 02/12/2023.  The patient continued to have abdominal pain at that time with some hematochezia.  The patient was given a course of ciprofloxacin and metronidazole which she has finished.  She stated that she had some improvement in her left lower quadrant abdominal pain, but has developed right lower quadrant abdominal pain.  She continues to have some loose stool that she describes as pinkish.  Has had some subjective fevers and chills.  She has some nausea without emesis.  She denies any frank chest pain, shortness breath, hemoptysis.  She describes some fluttering in her chest occasionally. In the ED, the patient was afebrile hemodynamically stable with oxygen saturation 95% on room air.  WBC 8.6, hemoglobin 12.0, platelets 314.  Sodium 136, potassium 3.4, bicarbonate 24, serum creatinine 0.64.  LFTs unremarkable.  Lipase 29.  CT of the abdomen and pelvis showed mild stranding about a diverticulum in the distal descending colon consistent with diverticulitis.  There is no perforation nor abscess.  Patient started on zosyn

## 2023-02-28 NOTE — Progress Notes (Signed)
Pharmacy Antibiotic Note  [name] is a 87 y.o. female admitted on 02/27/2023 with  intra-abdominal infection .  Pharmacy has been consulted for Zosyn dosing. WBC WNL. Renal function good.   Plan: Zosyn 3.375G IV q8h to be infused over 4 hours  Height: 5\' 2"  (157.5 cm) Weight: 74.8 kg (165 lb) IBW/kg (Calculated) : 50.1  Temp (24hrs), Avg:98.2 F (36.8 C), Min:98 F (36.7 C), Max:98.4 F (36.9 C)  Recent Labs  Lab 02/27/23 1156 02/28/23 0313  WBC 8.6 6.7  CREATININE 0.62 0.64    Estimated Creatinine Clearance: 48.7 mL/min (by C-G formula based on SCr of 0.64 mg/dL).    Allergies  Allergen Reactions   Plavix [Clopidogrel Bisulfate] Itching    Severe itching   Calcium Channel Blockers Other (See Comments)    cramping   Alprazolam Nausea And Vomiting   Codeine Nausea And Vomiting   Percodan [Oxycodone-Aspirin] Nausea And Vomiting   Valium Nausea And Vomiting    Abran Duke, PharmD, BCPS Clinical Pharmacist Phone: [phone]

## 2023-02-28 NOTE — Progress Notes (Signed)
PROGRESS NOTE  [name] ZHY:[id number] DOB: Sep 10, 1935 DOA: 02/27/2023 PCP: Mechele Claude, MD  Brief History:  87 year old female with a history of coronary artery disease (s/p DES to mid-LAD and DES to mid-OM1 in 11/2016, HFrEF (EF 35-40%), hypertension, hyperlipidemia, dilation of ascending aorta (4.3 cm by imaging 01/2022), and COPD presenting with lower abdominal pain for the better part of a month.  Patient was initially seen in the ED on 02/03/2023 for LLQ abdominal pain.  CT of the abdomen and pelvis on the day showed mild sigmoid diverticulitis without perforation.  The patient was discharged home in stable condition with Augmentin.  She followed up with her PCP on 02/12/2023.  The patient continued to have abdominal pain at that time with some hematochezia.  The patient was given a course of ciprofloxacin and metronidazole which she has finished.  She stated that she had some improvement in her left lower quadrant abdominal pain, but has developed right lower quadrant abdominal pain.  She continues to have some loose stool that she describes as pinkish.  Has had some subjective fevers and chills.  She has some nausea without emesis.  She denies any frank chest pain, shortness breath, hemoptysis.  She describes some fluttering in her chest occasionally. In the ED, the patient was afebrile hemodynamically stable with oxygen saturation 95% on room air.  WBC 8.6, hemoglobin 12.0, platelets 314.  Sodium 136, potassium 3.4, bicarbonate 24, serum creatinine 0.64.  LFTs unremarkable.  Lipase 29.  CT of the abdomen and pelvis showed mild stranding about a diverticulum in the distal descending colon consistent with diverticulitis.  There is no perforation nor abscess.  Patient started on zosyn   Assessment/Plan:  Acute diverticulitis -Patient had failure to outpatient antibiotics -Start IV Zosyn -Clear Liquid diet -Judicious opioids  Coronary artery disease Continue aspirin,  carvedilol, statin  Chronic HFrEF -01/25/2022 echo EF 35-40%, global HK, normal RVF -Continue Entresto, carvedilol, Imdur -Clinically euvolemic   History of Presyncope - Prior monitor as discussed above showed episodes of VT with the longest lasting for 16 beats and she did have PVC's with a 3.6% burden. Findings were reviewed with Dr. Eden Emms who recommended medical therapy at that time. She denies any recurrent presyncopal episodes. Remains on Coreg 3.125 mg twice daily given intermittent palpitations (had bradycardia with higher dosing).   Hyperlipidemia -Continue Crestor  COPD -Stable on room air -continue Trelegy  Chronic respiratory failure with hypoxia from COPD. - 2 liters with exertion and sleep - goal SpO2 > 90%  Obesity -BMI 30.18 -lifestyle modification     Family Communication:  no Family at bedside  Consultants:  none  Code Status:  FULL   DVT Prophylaxis:  SCDs   Procedures: As Listed in Progress Note Above  Antibiotics: Zosyn 10/17>>    Subjective: Patient states that her abdominal pain is low but better than yesterday.  She denies any vomiting but has some nausea.  She denies any fevers, chills, chest pain, shortness breath, hematochezia, melena.  Objective: Vitals:   02/28/23 0400 02/28/23 0500 02/28/23 0535 02/28/23 0721  BP: (!) 116/55 121/60    Pulse: 76 78    Resp: 18 18  (!) 1  Temp:   98.4 F (36.9 C) 98.3 F (36.8 C)  TempSrc:   Oral Oral  SpO2: 92% 92%  95%  Weight:      Height:        Intake/Output Summary (Last 24 hours) at  02/28/2023 0825 Last data filed at 02/28/2023 0017 Gross per 24 hour  Intake 199.19 ml  Output --  Net 199.19 ml   Weight change:  Exam:  General:  Pt is alert, follows commands appropriately, not in acute distress HEENT: No icterus, No thrush, No neck mass, [place]/AT Cardiovascular: RRR, S1/S2, no rubs, no gallops Respiratory: CTA bilaterally, no wheezing, no crackles, no rhonchi Abdomen: Soft/+BS,  RLQ>LLQ tender, non distended, no guarding Extremities: No edema, No lymphangitis, No petechiae, No rashes, no synovitis   Data Reviewed: I have personally reviewed following labs and imaging studies Basic Metabolic Panel: Recent Labs  Lab 02/27/23 1156 02/28/23 0313  NA 139 136  K 4.2 3.4*  CL 101 101  CO2 27 26  GLUCOSE 108* 99  BUN 10 9  CREATININE 0.62 0.64  CALCIUM 10.0 9.4  MG  --  2.1  PHOS  --  3.0   Liver Function Tests: Recent Labs  Lab 02/27/23 1156 02/28/23 0313  AST 19 16  ALT 14 13  ALKPHOS 40 36*  BILITOT 0.6 0.9  PROT 6.6 6.0*  ALBUMIN 4.0 3.5   Recent Labs  Lab 02/27/23 1156  LIPASE 29   No results for input(s): "AMMONIA" in the last 168 hours. Coagulation Profile: No results for input(s): "INR", "PROTIME" in the last 168 hours. CBC: Recent Labs  Lab 02/27/23 1156 02/28/23 0313  WBC 8.6 6.7  HGB 12.0 11.6*  HCT 37.6 36.4  MCV 85.3 84.3  PLT 314 287   Cardiac Enzymes: No results for input(s): "CKTOTAL", "CKMB", "CKMBINDEX", "TROPONINI" in the last 168 hours. BNP: Invalid input(s): "POCBNP" CBG: No results for input(s): "GLUCAP" in the last 168 hours. HbA1C: No results for input(s): "HGBA1C" in the last 72 hours. Urine analysis:    Component Value Date/Time   COLORURINE STRAW (A) 02/27/2023 1140   APPEARANCEUR CLEAR 02/27/2023 1140   APPEARANCEUR Clear 01/24/2023 1510   LABSPEC 1.003 (L) 02/27/2023 1140   PHURINE 7.0 02/27/2023 1140   GLUCOSEU NEGATIVE 02/27/2023 1140   HGBUR NEGATIVE 02/27/2023 1140   BILIRUBINUR NEGATIVE 02/27/2023 1140   BILIRUBINUR Negative 01/24/2023 1510   KETONESUR NEGATIVE 02/27/2023 1140   PROTEINUR NEGATIVE 02/27/2023 1140   UROBILINOGEN 0.2 03/30/2021 1513   UROBILINOGEN 0.2 08/20/2013 1610   NITRITE NEGATIVE 02/27/2023 1140   LEUKOCYTESUR TRACE (A) 02/27/2023 1140   Sepsis Labs: @LABRCNTIP (procalcitonin:4,lacticidven:4) )No results found for this or any previous visit (from the past 240  hour(s)).   Scheduled Meds:  enoxaparin (LOVENOX) injection  40 mg Subcutaneous Q24H   Continuous Infusions:  piperacillin-tazobactam (ZOSYN)  IV 3.375 g (02/28/23 0721)    Procedures/Studies: CT ABDOMEN PELVIS W CONTRAST  Result Date: 02/27/2023 CLINICAL DATA:  Left lower quadrant abdominal pain, right lower quadrant and diarrhea. History of diverticulitis. EXAM: CT ABDOMEN AND PELVIS WITH CONTRAST TECHNIQUE: Multidetector CT imaging of the abdomen and pelvis was performed using the standard protocol following bolus administration of intravenous contrast. RADIATION DOSE REDUCTION: This exam was performed according to the departmental dose-optimization program which includes automated exposure control, adjustment of the mA and/or kV according to patient size and/or use of iterative reconstruction technique. CONTRAST:  OMNIPAQUE IOHEXOL 300 MG/ML  SOLN COMPARISON:  CT abdomen and pelvis 02/03/2023 FINDINGS: Lower chest: No acute abnormality. Hepatobiliary: Cholecystectomy Buttock cysts/hemangiomas. Prominent bile duct due to reservoir effect. Pancreas: Unremarkable. Spleen: Unremarkable. Adrenals/Urinary Tract: Stable adrenal glands and kidneys. No urinary calculi or hydronephrosis. Cystocele of the bladder. Stomach/Bowel: Normal caliber large and small bowel. Colonic diverticulosis.  Mild stranding about diverticulum distal descending colon (series 4/image 30). No abscess or perforation. The appendix is not definitively visualized. No secondary signs of appendicitis. Small hiatal. Vascular/Lymphatic: Aortic atherosclerosis. No enlarged abdominal or pelvic lymph nodes. Reproductive: Hysterectomy. Other: No free intraperitoneal fluid or air. Musculoskeletal: No acute fracture IMPRESSION: 1. Mild stranding about a diverticulum in distal descending colon compatible with diverticulitis. No abscess or perforation. 2. Pelvic floor laxity with cystocele of the bladder. Electronically Signed   By: Minerva Fester M.D.   On: 02/27/2023 22:52   CT ABDOMEN PELVIS W CONTRAST  Result Date: 02/03/2023 CLINICAL DATA:  Worsening bilateral lower quadrant pain. Currently on antibiotic therapy for urinary tract infection. EXAM: CT ABDOMEN AND PELVIS WITH CONTRAST TECHNIQUE: Multidetector CT imaging of the abdomen and pelvis was performed using the standard protocol following bolus administration of intravenous contrast. RADIATION DOSE REDUCTION: This exam was performed according to the departmental dose-optimization program which includes automated exposure control, adjustment of the mA and/or kV according to patient size and/or use of iterative reconstruction technique. CONTRAST:  OMNIPAQUE IOHEXOL 300 MG/ML  SOLN COMPARISON:  11/11/2017 FINDINGS: Lower Chest: No acute findings.  Stable cardiomegaly. Hepatobiliary: Stable 2.7 cm benign hemangioma in the posterior right hepatic lobe. Probable tiny cyst near the junction of the right and left lobes also unchanged. No new or enlarging liver lesions identified. Prior cholecystectomy. No evidence of biliary obstruction. Pancreas:  No mass or inflammatory changes. Spleen: Within normal limits in size and appearance. Adrenals/Urinary Tract: No suspicious masses identified. No evidence of ureteral calculi or hydronephrosis. Pelvic floor laxity with small cystocele noted. Otherwise unremarkable appearance of urinary bladder. Stomach/Bowel: Mild sigmoid diverticulitis is seen. No evidence of bowel perforation or abscess. Vascular/Lymphatic: No pathologically enlarged lymph nodes. No acute vascular findings. Reproductive: Prior hysterectomy noted. Adnexal regions are unremarkable in appearance. Other:  None. Musculoskeletal:  No suspicious bone lesions identified. IMPRESSION: Mild sigmoid diverticulitis. No evidence of bowel perforation or abscess. Pelvic floor laxity with small cystocele. Electronically Signed   By: Danae Orleans M.D.   On: 02/03/2023 09:51    Catarina Hartshorn,  DO  [facility]  If 7PM-7AM, please contact night-coverage [email or web address] Password [email or web address] 02/28/2023, 8:25 AM   LOS: 1 day

## 2023-02-28 NOTE — ED Notes (Signed)
Pt sitting in chair communicating with no distress. Denies nausea and vomiting. Abdomen non tender at the moment.  Pt stated she was diagnosed with diverticulitis 3 months ago and it has gotten better since taken medication.

## 2023-02-28 NOTE — ED Notes (Signed)
Pt resting well. Nad. A/o. States pain not bad right now. Aware awaiting admission bed.

## 2023-02-28 NOTE — ED Notes (Signed)
Second bag of potassium administered per Dr. Arbutus Leas.

## 2023-02-28 NOTE — Progress Notes (Signed)
   02/28/23 1045  TOC Brief Assessment  Insurance and Status Reviewed  Patient has primary care physician Yes  Home environment has been reviewed from home  Prior level of function: independent  Prior/Current Home Services No current home services  Social Determinants of Health Reivew SDOH reviewed no interventions necessary  Readmission risk has been reviewed Yes  Transition of care needs no transition of care needs at this time    Transition of Care Department [facility]) has reviewed patient and no TOC needs have been identified at this time. We will continue to monitor patient advancement through interdisciplinary progression rounds. If new patient transition needs arise, please place a TOC consult.

## 2023-03-01 DIAGNOSIS — I5022 Chronic systolic (congestive) heart failure: Secondary | ICD-10-CM | POA: Diagnosis not present

## 2023-03-01 DIAGNOSIS — K5792 Diverticulitis of intestine, part unspecified, without perforation or abscess without bleeding: Secondary | ICD-10-CM | POA: Diagnosis not present

## 2023-03-01 DIAGNOSIS — E669 Obesity, unspecified: Secondary | ICD-10-CM | POA: Diagnosis not present

## 2023-03-01 LAB — CBC
HCT: 34.6 % — ABNORMAL LOW (ref 36.0–46.0)
Hemoglobin: 10.9 g/dL — ABNORMAL LOW (ref 12.0–15.0)
MCH: 26.6 pg (ref 26.0–34.0)
MCHC: 31.5 g/dL (ref 30.0–36.0)
MCV: 84.4 fL (ref 80.0–100.0)
Platelets: 280 10*3/uL (ref 150–400)
RBC: 4.1 MIL/uL (ref 3.87–5.11)
RDW: 14.6 % (ref 11.5–15.5)
WBC: 6.2 10*3/uL (ref 4.0–10.5)
nRBC: 0 % (ref 0.0–0.2)

## 2023-03-01 LAB — BASIC METABOLIC PANEL
Anion gap: 8 (ref 5–15)
BUN: 6 mg/dL — ABNORMAL LOW (ref 8–23)
CO2: 26 mmol/L (ref 22–32)
Calcium: 9.1 mg/dL (ref 8.9–10.3)
Chloride: 102 mmol/L (ref 98–111)
Creatinine, Ser: 0.69 mg/dL (ref 0.44–1.00)
GFR, Estimated: >60 mL/min (ref >60)
Glucose, Bld: 98 mg/dL (ref 70–99)
Potassium: 3.4 mmol/L — ABNORMAL LOW (ref 3.5–5.1)
Sodium: 136 mmol/L (ref 135–145)

## 2023-03-01 LAB — MAGNESIUM: Magnesium: 2 mg/dL (ref 1.7–2.4)

## 2023-03-01 MED ORDER — POTASSIUM CHLORIDE CRYS ER 20 MEQ PO TBCR
20.0000 meq | EXTENDED_RELEASE_TABLET | Freq: Once | ORAL | Status: AC
  Administered 2023-03-01: 20 meq via ORAL
  Filled 2023-03-01: qty 1

## 2023-03-01 NOTE — Progress Notes (Addendum)
PROGRESS NOTE  [name] CZY:[id number] DOB: [birth year] DOA: 02/27/2023 PCP: Mechele Claude, MD  Brief History:  87 year old female with a history of coronary artery disease (s/p DES to mid-LAD and DES to mid-OM1 in 11/2016, HFrEF (EF 35-40%), hypertension, hyperlipidemia, dilation of ascending aorta (4.3 cm by imaging 01/2022), and COPD presenting with lower abdominal pain for the better part of a month.  Patient was initially seen in the ED on 02/03/2023 for LLQ abdominal pain.  CT of the abdomen and pelvis on the day showed mild sigmoid diverticulitis without perforation.  The patient was discharged home in stable condition with Augmentin.  She followed up with her PCP on 02/12/2023.  The patient continued to have abdominal pain at that time with some hematochezia.  The patient was given a course of ciprofloxacin and metronidazole which she has finished.  She stated that she had some improvement in her left lower quadrant abdominal pain, but has developed right lower quadrant abdominal pain.  She continues to have some loose stool that she describes as pinkish.  Has had some subjective fevers and chills.  She has some nausea without emesis.  She denies any frank chest pain, shortness breath, hemoptysis.  She describes some fluttering in her chest occasionally. In the ED, the patient was afebrile hemodynamically stable with oxygen saturation 95% on room air.  WBC 8.6, hemoglobin 12.0, platelets 314.  Sodium 136, potassium 3.4, bicarbonate 24, serum creatinine 0.64.  LFTs unremarkable.  Lipase 29.  CT of the abdomen and pelvis showed mild stranding about a diverticulum in the distal descending colon consistent with diverticulitis.  There is no perforation nor abscess.  Patient started on zosyn   Assessment/Plan: Acute diverticulitis -Patient had failure to outpatient antibiotics -Started IV Zosyn -Clear Liquid diet>>full liquids -Judicious opioids -consult GI as pt continues to have RLQ  pain -cdiff neg -stool pathogen panel -UA--no pyuria   Coronary artery disease Continue aspirin, carvedilol, statin   Chronic HFrEF -01/25/2022 echo EF 35-40%, global HK, normal RVF -Continue Entresto, carvedilol, Imdur -Clinically euvolemic    History of Presyncope - Prior monitor as discussed above showed episodes of VT with the longest lasting for 16 beats and she did have PVC's with a 3.6% burden. Findings were reviewed with Dr. Eden Emms who recommended medical therapy at that time. She denies any recurrent presyncopal episodes. Remains on Coreg 3.125 mg twice daily given intermittent palpitations (had bradycardia with higher dosing).    Hyperlipidemia -Continue Crestor   COPD -Stable on room air -continue Trelegy   Chronic respiratory failure with hypoxia from COPD. - 2 liters with exertion and sleep - goal SpO2 > 90%   Obesity -BMI 30.18 -lifestyle modification  Hypokalemia -replete         Family Communication:  no Family at bedside   Consultants:  none   Code Status:  FULL    DVT Prophylaxis:  SCDs     Procedures: As Listed in Progress Note Above   Antibiotics: Zosyn 10/17>>     Subjective: Pt still complains of RLQ pain.  Denies f/c, n/v.  Still having loose stool.  Wants to advance diet  Objective: Vitals:   02/28/23 2316 03/01/23 0636 03/01/23 0907 03/01/23 1212  BP: (!) 105/48 (!) 105/48  (!) 110/52  Pulse: 79 71  70  Resp: 20 20  19   Temp: 98 F (36.7 C) 98.5 F (36.9 C)  98.7 F (37.1 C)  TempSrc: Oral Oral  Oral  SpO2: 95% 95% 96% 97%  Weight:      Height:        Intake/Output Summary (Last 24 hours) at 03/01/2023 1831 Last data filed at 03/01/2023 1816 Gross per 24 hour  Intake 1659.62 ml  Output --  Net 1659.62 ml   Weight change:  Exam:  General:  Pt is alert, follows commands appropriately, not in acute distress HEENT: No icterus, No thrush, No neck mass, [place]/AT Cardiovascular: RRR, S1/S2, no rubs, no  gallops Respiratory: CTA bilaterally, no wheezing, no crackles, no rhonchi Abdomen: Soft/+BS, RLQ tender, non distended, no guarding Extremities: No edema, No lymphangitis, No petechiae, No rashes, no synovitis   Data Reviewed: I have personally reviewed following labs and imaging studies Basic Metabolic Panel: Recent Labs  Lab 02/27/23 1156 02/28/23 0313 03/01/23 0433  NA 139 136 136  K 4.2 3.4* 3.4*  CL 101 101 102  CO2 27 26 26   GLUCOSE 108* 99 98  BUN 10 9 6*  CREATININE 0.62 0.64 0.69  CALCIUM 10.0 9.4 9.1  MG  --  2.1 2.0  PHOS  --  3.0  --    Liver Function Tests: Recent Labs  Lab 02/27/23 1156 02/28/23 0313  AST 19 16  ALT 14 13  ALKPHOS 40 36*  BILITOT 0.6 0.9  PROT 6.6 6.0*  ALBUMIN 4.0 3.5   Recent Labs  Lab 02/27/23 1156  LIPASE 29   No results for input(s): "AMMONIA" in the last 168 hours. Coagulation Profile: No results for input(s): "INR", "PROTIME" in the last 168 hours. CBC: Recent Labs  Lab 02/27/23 1156 02/28/23 0313 03/01/23 0433  WBC 8.6 6.7 6.2  HGB 12.0 11.6* 10.9*  HCT 37.6 36.4 34.6*  MCV 85.3 84.3 84.4  PLT 314 287 280   Cardiac Enzymes: No results for input(s): "CKTOTAL", "CKMB", "CKMBINDEX", "TROPONINI" in the last 168 hours. BNP: Invalid input(s): "POCBNP" CBG: No results for input(s): "GLUCAP" in the last 168 hours. HbA1C: No results for input(s): "HGBA1C" in the last 72 hours. Urine analysis:    Component Value Date/Time   COLORURINE STRAW (A) 02/27/2023 1140   APPEARANCEUR CLEAR 02/27/2023 1140   APPEARANCEUR Clear 01/24/2023 1510   LABSPEC 1.003 (L) 02/27/2023 1140   PHURINE 7.0 02/27/2023 1140   GLUCOSEU NEGATIVE 02/27/2023 1140   HGBUR NEGATIVE 02/27/2023 1140   BILIRUBINUR NEGATIVE 02/27/2023 1140   BILIRUBINUR Negative 01/24/2023 1510   KETONESUR NEGATIVE 02/27/2023 1140   PROTEINUR NEGATIVE 02/27/2023 1140   UROBILINOGEN 0.2 03/30/2021 1513   UROBILINOGEN 0.2 08/20/2013 1610   NITRITE NEGATIVE  02/27/2023 1140   LEUKOCYTESUR TRACE (A) 02/27/2023 1140   Sepsis Labs: @LABRCNTIP (procalcitonin:4,lacticidven:4) ) Recent Results (from the past 240 hour(s))  Culture, blood (Routine X 2) w Reflex to ID Panel     Status: None (Preliminary result)   Collection Time: 02/28/23  7:14 AM   Specimen: BLOOD  Result Value Ref Range Status   Specimen Description BLOOD BLOOD RIGHT ARM  Final   Special Requests   Final    BOTTLES DRAWN AEROBIC AND ANAEROBIC Blood Culture results may not be optimal due to an excessive volume of blood received in culture bottles   Culture   Final    NO GROWTH 1 DAY Performed at [facility], [address] [place], [place] [address]    Report Status PENDING  Incomplete  Culture, blood (Routine X 2) w Reflex to ID Panel     Status: None (Preliminary result)   Collection Time: 02/28/23  7:14 AM   Specimen: BLOOD  Result Value Ref Range Status   Specimen Description BLOOD BLOOD RIGHT HAND  Final   Special Requests   Final    BOTTLES DRAWN AEROBIC AND ANAEROBIC Blood Culture results may not be optimal due to an excessive volume of blood received in culture bottles   Culture   Final    NO GROWTH 1 DAY Performed at [facility], [address] [place], [place] [address]    Report Status PENDING  Incomplete  C Difficile Quick Screen w PCR reflex     Status: None   Collection Time: 02/28/23  9:38 AM   Specimen: STOOL  Result Value Ref Range Status   C Diff antigen NEGATIVE NEGATIVE Final   C Diff toxin NEGATIVE NEGATIVE Final   C Diff interpretation No C. difficile detected.  Final    Comment: Performed at [facility], [address] [place], [place] [address]     Scheduled Meds:  aspirin EC  81 mg Oral Daily   carvedilol  3.125 mg Oral BID WC   enoxaparin (LOVENOX) injection  40 mg Subcutaneous Q24H   fluticasone furoate-vilanterol  1 puff Inhalation Daily   And   umeclidinium bromide  1 puff Inhalation Daily   isosorbide mononitrate  30 mg Oral Daily    rosuvastatin  20 mg Oral Daily   sacubitril-valsartan  1 tablet Oral BID   Continuous Infusions:  piperacillin-tazobactam (ZOSYN)  IV 3.375 g (03/01/23 1451)    Procedures/Studies: CT ABDOMEN PELVIS W CONTRAST  Result Date: 02/27/2023 CLINICAL DATA:  Left lower quadrant abdominal pain, right lower quadrant and diarrhea. History of diverticulitis. EXAM: CT ABDOMEN AND PELVIS WITH CONTRAST TECHNIQUE: Multidetector CT imaging of the abdomen and pelvis was performed using the standard protocol following bolus administration of intravenous contrast. RADIATION DOSE REDUCTION: This exam was performed according to the departmental dose-optimization program which includes automated exposure control, adjustment of the mA and/or kV according to patient size and/or use of iterative reconstruction technique. CONTRAST:  OMNIPAQUE IOHEXOL 300 MG/ML  SOLN COMPARISON:  CT abdomen and pelvis 02/03/2023 FINDINGS: Lower chest: No acute abnormality. Hepatobiliary: Cholecystectomy Buttock cysts/hemangiomas. Prominent bile duct due to reservoir effect. Pancreas: Unremarkable. Spleen: Unremarkable. Adrenals/Urinary Tract: Stable adrenal glands and kidneys. No urinary calculi or hydronephrosis. Cystocele of the bladder. Stomach/Bowel: Normal caliber large and small bowel. Colonic diverticulosis. Mild stranding about diverticulum distal descending colon (series 4/image 30). No abscess or perforation. The appendix is not definitively visualized. No secondary signs of appendicitis. Small hiatal. Vascular/Lymphatic: Aortic atherosclerosis. No enlarged abdominal or pelvic lymph nodes. Reproductive: Hysterectomy. Other: No free intraperitoneal fluid or air. Musculoskeletal: No acute fracture IMPRESSION: 1. Mild stranding about a diverticulum in distal descending colon compatible with diverticulitis. No abscess or perforation. 2. Pelvic floor laxity with cystocele of the bladder. Electronically Signed   By: Minerva Fester M.D.    On: 02/27/2023 22:52   CT ABDOMEN PELVIS W CONTRAST  Result Date: 02/03/2023 CLINICAL DATA:  Worsening bilateral lower quadrant pain. Currently on antibiotic therapy for urinary tract infection. EXAM: CT ABDOMEN AND PELVIS WITH CONTRAST TECHNIQUE: Multidetector CT imaging of the abdomen and pelvis was performed using the standard protocol following bolus administration of intravenous contrast. RADIATION DOSE REDUCTION: This exam was performed according to the departmental dose-optimization program which includes automated exposure control, adjustment of the mA and/or kV according to patient size and/or use of iterative reconstruction technique. CONTRAST:  OMNIPAQUE IOHEXOL 300 MG/ML  SOLN COMPARISON:  11/11/2017  FINDINGS: Lower Chest: No acute findings.  Stable cardiomegaly. Hepatobiliary: Stable 2.7 cm benign hemangioma in the posterior right hepatic lobe. Probable tiny cyst near the junction of the right and left lobes also unchanged. No new or enlarging liver lesions identified. Prior cholecystectomy. No evidence of biliary obstruction. Pancreas:  No mass or inflammatory changes. Spleen: Within normal limits in size and appearance. Adrenals/Urinary Tract: No suspicious masses identified. No evidence of ureteral calculi or hydronephrosis. Pelvic floor laxity with small cystocele noted. Otherwise unremarkable appearance of urinary bladder. Stomach/Bowel: Mild sigmoid diverticulitis is seen. No evidence of bowel perforation or abscess. Vascular/Lymphatic: No pathologically enlarged lymph nodes. No acute vascular findings. Reproductive: Prior hysterectomy noted. Adnexal regions are unremarkable in appearance. Other:  None. Musculoskeletal:  No suspicious bone lesions identified. IMPRESSION: Mild sigmoid diverticulitis. No evidence of bowel perforation or abscess. Pelvic floor laxity with small cystocele. Electronically Signed   By: Danae Orleans M.D.   On: 02/03/2023 09:51    Catarina Hartshorn, DO  [facility]  If 7PM-7AM, please contact night-coverage [email or web address] Password [email or web address] 03/01/2023, 6:31 PM   LOS: 2 days

## 2023-03-01 NOTE — Care Management Important Message (Addendum)
Important Message  Patient Details  Name: [name] MRN: [id number] Date of Birth: 03-Nov-1935   Important Message Given:  N/A - LOS <3 / Initial given by admissions  Patient on isolation, spoke by phone to review letter Attempted to contact daughter [name] at [phone]   [name] 03/01/2023, 2:39 PM

## 2023-03-01 NOTE — Plan of Care (Signed)
[facility] Tonsillectomy/Adenoidectomy, Postoperative PEDS care plan entered in error.

## 2023-03-02 DIAGNOSIS — K5792 Diverticulitis of intestine, part unspecified, without perforation or abscess without bleeding: Secondary | ICD-10-CM | POA: Diagnosis not present

## 2023-03-02 DIAGNOSIS — E669 Obesity, unspecified: Secondary | ICD-10-CM | POA: Diagnosis not present

## 2023-03-02 DIAGNOSIS — R1030 Lower abdominal pain, unspecified: Secondary | ICD-10-CM

## 2023-03-02 DIAGNOSIS — I10 Essential (primary) hypertension: Secondary | ICD-10-CM | POA: Diagnosis not present

## 2023-03-02 DIAGNOSIS — I5022 Chronic systolic (congestive) heart failure: Secondary | ICD-10-CM | POA: Diagnosis not present

## 2023-03-02 LAB — CBC
HCT: 36.7 % (ref 36.0–46.0)
Hemoglobin: 11.5 g/dL — ABNORMAL LOW (ref 12.0–15.0)
MCH: 27 pg (ref 26.0–34.0)
MCHC: 31.3 g/dL (ref 30.0–36.0)
MCV: 86.2 fL (ref 80.0–100.0)
Platelets: 289 10*3/uL (ref 150–400)
RBC: 4.26 MIL/uL (ref 3.87–5.11)
RDW: 14.6 % (ref 11.5–15.5)
WBC: 6.3 10*3/uL (ref 4.0–10.5)
nRBC: 0 % (ref 0.0–0.2)

## 2023-03-02 LAB — BASIC METABOLIC PANEL
Anion gap: 8 (ref 5–15)
BUN: <5 mg/dL — ABNORMAL LOW (ref 8–23)
CO2: 25 mmol/L (ref 22–32)
Calcium: 9 mg/dL (ref 8.9–10.3)
Chloride: 105 mmol/L (ref 98–111)
Creatinine, Ser: 0.7 mg/dL (ref 0.44–1.00)
GFR, Estimated: >60 mL/min (ref >60)
Glucose, Bld: 100 mg/dL — ABNORMAL HIGH (ref 70–99)
Potassium: 3.6 mmol/L (ref 3.5–5.1)
Sodium: 138 mmol/L (ref 135–145)

## 2023-03-02 LAB — LACTIC ACID, PLASMA: Lactic Acid, Venous: 0.8 mmol/L (ref 0.5–1.9)

## 2023-03-02 LAB — MAGNESIUM: Magnesium: 2 mg/dL (ref 1.7–2.4)

## 2023-03-02 MED ORDER — DICYCLOMINE HCL 10 MG PO CAPS
10.0000 mg | ORAL_CAPSULE | Freq: Three times a day (TID) | ORAL | Status: DC | PRN
  Administered 2023-03-02 – 2023-03-03 (×3): 10 mg via ORAL
  Filled 2023-03-02 (×3): qty 1

## 2023-03-02 MED ORDER — PANTOPRAZOLE SODIUM 40 MG PO TBEC
40.0000 mg | DELAYED_RELEASE_TABLET | Freq: Every day | ORAL | Status: DC
  Administered 2023-03-02 – 2023-03-03 (×2): 40 mg via ORAL
  Filled 2023-03-02 (×2): qty 1

## 2023-03-02 NOTE — Progress Notes (Signed)
PROGRESS NOTE  [name] WGN:[id number] DOB: 09-11-1935 DOA: 02/27/2023 PCP: Mechele Claude, MD  Brief History:  87 year old female with a history of coronary artery disease (s/p DES to mid-LAD and DES to mid-OM1 in 11/2016, HFrEF (EF 35-40%), hypertension, hyperlipidemia, dilation of ascending aorta (4.3 cm by imaging 01/2022), and COPD presenting with lower abdominal pain for the better part of a month.  Patient was initially seen in the ED on 02/03/2023 for LLQ abdominal pain.  CT of the abdomen and pelvis on the day showed mild sigmoid diverticulitis without perforation.  The patient was discharged home in stable condition with Augmentin.  She followed up with her PCP on 02/12/2023.  The patient continued to have abdominal pain at that time with some hematochezia.  The patient was given a course of ciprofloxacin and metronidazole which she has finished.  She stated that she had some improvement in her left lower quadrant abdominal pain, but has developed right lower quadrant abdominal pain.  She continues to have some loose stool that she describes as pinkish.  Has had some subjective fevers and chills.  She has some nausea without emesis.  She denies any frank chest pain, shortness breath, hemoptysis.  She describes some fluttering in her chest occasionally. In the ED, the patient was afebrile hemodynamically stable with oxygen saturation 95% on room air.  WBC 8.6, hemoglobin 12.0, platelets 314.  Sodium 136, potassium 3.4, bicarbonate 24, serum creatinine 0.64.  LFTs unremarkable.  Lipase 29.  CT of the abdomen and pelvis showed mild stranding about a diverticulum in the distal descending colon consistent with diverticulitis.  There is no perforation nor abscess.  Patient started on zosyn   Assessment/Plan: Acute diverticulitis -Patient had failure to outpatient antibiotics -continue IV Zosyn -Clear Liquid diet>>full liquids -Judicious opioids -appreciate GI consult>>add bentyl,  colonoscopy in 8 weeks -cdiff neg -stool pathogen panel if has another loose stool -UA--no pyuria   Coronary artery disease Continue aspirin, carvedilol, statin   Chronic HFrEF -01/25/2022 echo EF 35-40%, global HK, normal RVF -Continue Entresto, carvedilol, Imdur -Clinically euvolemic    History of Presyncope - Prior monitor as discussed above showed episodes of VT with the longest lasting for 16 beats and she did have PVC's with a 3.6% burden. Findings were reviewed with Dr. Eden Emms who recommended medical therapy at that time. She denies any recurrent presyncopal episodes. Remains on Coreg 3.125 mg twice daily given intermittent palpitations (had bradycardia with higher dosing).    Hyperlipidemia -Continue Crestor   COPD -Stable on room air -continue Trelegy   Chronic respiratory failure with hypoxia from COPD. - 2 liters with exertion and sleep - goal SpO2 > 90%   Obesity -BMI 30.18 -lifestyle modification   Hypokalemia -replete -mag 2.0         Family Communication:  no Family at bedside   Consultants:  none   Code Status:  FULL    DVT Prophylaxis:  SCDs     Procedures: As Listed in Progress Note Above   Antibiotics: Zosyn 10/17>>        Subjective: Pt states RLQ pain is improving.  Denies f/c, cp, sob, n/v/d.  No hematochezia.    Objective: Vitals:   03/02/23 0751 03/02/23 0937 03/02/23 1423 03/02/23 1624  BP:  (!) 126/52 (!) 136/92 103/75  Pulse:  80 87 75  Resp:  18 18   Temp:   98.1 F (36.7 C)   TempSrc:   Oral  SpO2: 97% 97% 96%   Weight:      Height:        Intake/Output Summary (Last 24 hours) at 03/02/2023 1732 Last data filed at 03/01/2023 1816 Gross per 24 hour  Intake 480 ml  Output --  Net 480 ml   Weight change:  Exam:  General:  Pt is alert, follows commands appropriately, not in acute distress HEENT: No icterus, No thrush, No neck mass, [place]/AT Cardiovascular: RRR, S1/S2, no rubs, no gallops Respiratory: CTA  bilaterally, no wheezing, no crackles, no rhonchi Abdomen: Soft/+BS, RLQ tender, non distended, no guarding Extremities: No edema, No lymphangitis, No petechiae, No rashes, no synovitis   Data Reviewed: I have personally reviewed following labs and imaging studies Basic Metabolic Panel: Recent Labs  Lab 02/27/23 1156 02/28/23 0313 03/01/23 0433 03/02/23 0355  NA 139 136 136 138  K 4.2 3.4* 3.4* 3.6  CL 101 101 102 105  CO2 27 26 26 25   GLUCOSE 108* 99 98 100*  BUN 10 9 6* <5*  CREATININE 0.62 0.64 0.69 0.70  CALCIUM 10.0 9.4 9.1 9.0  MG  --  2.1 2.0 2.0  PHOS  --  3.0  --   --    Liver Function Tests: Recent Labs  Lab 02/27/23 1156 02/28/23 0313  AST 19 16  ALT 14 13  ALKPHOS 40 36*  BILITOT 0.6 0.9  PROT 6.6 6.0*  ALBUMIN 4.0 3.5   Recent Labs  Lab 02/27/23 1156  LIPASE 29   No results for input(s): "AMMONIA" in the last 168 hours. Coagulation Profile: No results for input(s): "INR", "PROTIME" in the last 168 hours. CBC: Recent Labs  Lab 02/27/23 1156 02/28/23 0313 03/01/23 0433 03/02/23 0355  WBC 8.6 6.7 6.2 6.3  HGB 12.0 11.6* 10.9* 11.5*  HCT 37.6 36.4 34.6* 36.7  MCV 85.3 84.3 84.4 86.2  PLT 314 287 280 289   Cardiac Enzymes: No results for input(s): "CKTOTAL", "CKMB", "CKMBINDEX", "TROPONINI" in the last 168 hours. BNP: Invalid input(s): "POCBNP" CBG: No results for input(s): "GLUCAP" in the last 168 hours. HbA1C: No results for input(s): "HGBA1C" in the last 72 hours. Urine analysis:    Component Value Date/Time   COLORURINE STRAW (A) 02/27/2023 1140   APPEARANCEUR CLEAR 02/27/2023 1140   APPEARANCEUR Clear 01/24/2023 1510   LABSPEC 1.003 (L) 02/27/2023 1140   PHURINE 7.0 02/27/2023 1140   GLUCOSEU NEGATIVE 02/27/2023 1140   HGBUR NEGATIVE 02/27/2023 1140   BILIRUBINUR NEGATIVE 02/27/2023 1140   BILIRUBINUR Negative 01/24/2023 1510   KETONESUR NEGATIVE 02/27/2023 1140   PROTEINUR NEGATIVE 02/27/2023 1140   UROBILINOGEN 0.2  03/30/2021 1513   UROBILINOGEN 0.2 08/20/2013 1610   NITRITE NEGATIVE 02/27/2023 1140   LEUKOCYTESUR TRACE (A) 02/27/2023 1140   Sepsis Labs: @LABRCNTIP (procalcitonin:4,lacticidven:4) ) Recent Results (from the past 240 hour(s))  Culture, blood (Routine X 2) w Reflex to ID Panel     Status: None (Preliminary result)   Collection Time: 02/28/23  7:14 AM   Specimen: BLOOD  Result Value Ref Range Status   Specimen Description BLOOD BLOOD RIGHT ARM  Final   Special Requests   Final    BOTTLES DRAWN AEROBIC AND ANAEROBIC Blood Culture results may not be optimal due to an excessive volume of blood received in culture bottles   Culture   Final    NO GROWTH 2 DAYS Performed at [facility], [address] [place], [place] [address]    Report Status PENDING  Incomplete  Culture, blood (Routine X  2) w Reflex to ID Panel     Status: None (Preliminary result)   Collection Time: 02/28/23  7:14 AM   Specimen: BLOOD  Result Value Ref Range Status   Specimen Description BLOOD BLOOD RIGHT HAND  Final   Special Requests   Final    BOTTLES DRAWN AEROBIC AND ANAEROBIC Blood Culture results may not be optimal due to an excessive volume of blood received in culture bottles   Culture   Final    NO GROWTH 2 DAYS Performed at [facility], [address] [place], [place] [address]    Report Status PENDING  Incomplete  C Difficile Quick Screen w PCR reflex     Status: None   Collection Time: 02/28/23  9:38 AM   Specimen: STOOL  Result Value Ref Range Status   C Diff antigen NEGATIVE NEGATIVE Final   C Diff toxin NEGATIVE NEGATIVE Final   C Diff interpretation No C. difficile detected.  Final    Comment: Performed at [facility], [address] [place], [place] [address]     Scheduled Meds:  aspirin EC  81 mg Oral Daily   carvedilol  3.125 mg Oral BID WC   enoxaparin (LOVENOX) injection  40 mg Subcutaneous Q24H   fluticasone furoate-vilanterol  1 puff Inhalation Daily   And   umeclidinium  bromide  1 puff Inhalation Daily   isosorbide mononitrate  30 mg Oral Daily   pantoprazole  40 mg Oral Daily   rosuvastatin  20 mg Oral Daily   sacubitril-valsartan  1 tablet Oral BID   Continuous Infusions:  piperacillin-tazobactam (ZOSYN)  IV 3.375 g (03/02/23 1506)    Procedures/Studies: CT ABDOMEN PELVIS W CONTRAST  Result Date: 02/27/2023 CLINICAL DATA:  Left lower quadrant abdominal pain, right lower quadrant and diarrhea. History of diverticulitis. EXAM: CT ABDOMEN AND PELVIS WITH CONTRAST TECHNIQUE: Multidetector CT imaging of the abdomen and pelvis was performed using the standard protocol following bolus administration of intravenous contrast. RADIATION DOSE REDUCTION: This exam was performed according to the departmental dose-optimization program which includes automated exposure control, adjustment of the mA and/or kV according to patient size and/or use of iterative reconstruction technique. CONTRAST:  OMNIPAQUE IOHEXOL 300 MG/ML  SOLN COMPARISON:  CT abdomen and pelvis 02/03/2023 FINDINGS: Lower chest: No acute abnormality. Hepatobiliary: Cholecystectomy Buttock cysts/hemangiomas. Prominent bile duct due to reservoir effect. Pancreas: Unremarkable. Spleen: Unremarkable. Adrenals/Urinary Tract: Stable adrenal glands and kidneys. No urinary calculi or hydronephrosis. Cystocele of the bladder. Stomach/Bowel: Normal caliber large and small bowel. Colonic diverticulosis. Mild stranding about diverticulum distal descending colon (series 4/image 30). No abscess or perforation. The appendix is not definitively visualized. No secondary signs of appendicitis. Small hiatal. Vascular/Lymphatic: Aortic atherosclerosis. No enlarged abdominal or pelvic lymph nodes. Reproductive: Hysterectomy. Other: No free intraperitoneal fluid or air. Musculoskeletal: No acute fracture IMPRESSION: 1. Mild stranding about a diverticulum in distal descending colon compatible with diverticulitis. No abscess or  perforation. 2. Pelvic floor laxity with cystocele of the bladder. Electronically Signed   By: Minerva Fester M.D.   On: 02/27/2023 22:52   CT ABDOMEN PELVIS W CONTRAST  Result Date: 02/03/2023 CLINICAL DATA:  Worsening bilateral lower quadrant pain. Currently on antibiotic therapy for urinary tract infection. EXAM: CT ABDOMEN AND PELVIS WITH CONTRAST TECHNIQUE: Multidetector CT imaging of the abdomen and pelvis was performed using the standard protocol following bolus administration of intravenous contrast. RADIATION DOSE REDUCTION: This exam was performed according to the departmental dose-optimization program which includes automated exposure control,  adjustment of the mA and/or kV according to patient size and/or use of iterative reconstruction technique. CONTRAST:  OMNIPAQUE IOHEXOL 300 MG/ML  SOLN COMPARISON:  11/11/2017 FINDINGS: Lower Chest: No acute findings.  Stable cardiomegaly. Hepatobiliary: Stable 2.7 cm benign hemangioma in the posterior right hepatic lobe. Probable tiny cyst near the junction of the right and left lobes also unchanged. No new or enlarging liver lesions identified. Prior cholecystectomy. No evidence of biliary obstruction. Pancreas:  No mass or inflammatory changes. Spleen: Within normal limits in size and appearance. Adrenals/Urinary Tract: No suspicious masses identified. No evidence of ureteral calculi or hydronephrosis. Pelvic floor laxity with small cystocele noted. Otherwise unremarkable appearance of urinary bladder. Stomach/Bowel: Mild sigmoid diverticulitis is seen. No evidence of bowel perforation or abscess. Vascular/Lymphatic: No pathologically enlarged lymph nodes. No acute vascular findings. Reproductive: Prior hysterectomy noted. Adnexal regions are unremarkable in appearance. Other:  None. Musculoskeletal:  No suspicious bone lesions identified. IMPRESSION: Mild sigmoid diverticulitis. No evidence of bowel perforation or abscess. Pelvic floor laxity with  small cystocele. Electronically Signed   By: Danae Orleans M.D.   On: 02/03/2023 09:51    Catarina Hartshorn, DO  [facility]  If 7PM-7AM, please contact night-coverage [email or web address] Password [email or web address] 03/02/2023, 5:32 PM   LOS: 3 days

## 2023-03-02 NOTE — Consult Note (Signed)
Katrinka Blazing, M.D. Gastroenterology & Hepatology                                           Patient Name: Brittnay Rin Tensley Account #: @[email or web address] @   MRN: [id number] Admission Date: 02/27/2023 Date of Evaluation:  03/02/2023 Time of Evaluation: 8:59 AM   Referring Physician: Catarina Hartshorn, MD  Chief Complaint: Abdominal pain and melena  HPI:  This is a 87 y.o. female with history of anxiety, coronary artery disease disease status post stent placement, COPD, CHF, hypertension, hyperlipidemia, who was admitted to the hospital after presenting worsening abdominal pain and found to have acute diverticulitis.  Gastroenterology was consulted for evaluation of right lower quadrant abdominal pain and possible melena.  Patient came to the hospital on 02/27/2023 after presenting recurrent episodes of abdominal pain in the lower abdomen for the last 4 weeks.  She reports having some nausea and also noted some dark-colored stools intermittently.  No hematochezia.  No fever, chills, abdominal distention.  She denies using any NSAIDs but only uses baby aspirin on a regular basis.    Last colonoscopy was performed in 2016 by Dr. Karilyn Cota which could not be performed due to sharp angulation of the sigmoid colon.  Due to this she underwent a virtual colonoscopy on 10/27/2014 which did not show any masses although visualization of the descending colon and sigmoid colon was suboptimal.  Patient underwent CT of the abdomen pelvis with IV contrast on admission which showed mild stranding in the descending colon compatible with diverticulitis without abscess or perforation.  Most recent labs from today showed a BMP with normal BUN of less than 5, creatinine 0.7, normal electrolytes.  CBC showed mild anemia with hemoglobin of 11.5, WBC 6.3 and platelets 289.  C. difficile testing was negative but GI pathogen panel is pending.  Gastroenterology was consulted as the patient states her left lower quadrant pain has significantly  improved but her right lower quadrant is still present, although this is better compared to prior.  Also there were reports of melena.  Patient has commode at the side of the bed, which shows dark green stool.  Patient is currently on Zosyn for management of diverticulitis.  Past Medical History: SEE CHRONIC ISSSUES: Past Medical History:  Diagnosis Date   Acute on chronic respiratory failure with hypoxia (HCC) 04/24/2018   Anxiety    Atypical chest pain    Bronchospasm    CAD (coronary artery disease)    a. s/p DES to mid-LAD and DES to mid-OM1 in 11/2016   Cervical disc disorder with myelopathy, unspecified cervical region    Cervicalgia 01/19/2009   Qualifier: Diagnosis of  By: Romeo Apple MD, Stanley     Chest pain at rest 01/27/2015   Chronic systolic (congestive) heart failure (HCC)    COPD (chronic obstructive pulmonary disease) (HCC)    Coronary artery disease    De Quervain's disease (tenosynovitis) 04/02/2011   Disc disease with myelopathy, cervical    Essential hypertension    Hemorrhoids    Liver mass    Lung, cysts, congenital    Left lung cyst   Myocardial infarction (HCC)    Nephrolithiasis    Embedded   Nonischemic cardiomyopathy (HCC)    LVEF 35-40% 2015   On home O2    Osteoarthritis    Sprain of wrist 08/07/2012   Thoracic ascending aortic aneurysm (HCC)  4.3 cm April 2016   Past Surgical History:  Past Surgical History:  Procedure Laterality Date   Benign breast tumors     CHOLECYSTECTOMY     COLONOSCOPY     COLONOSCOPY N/A 09/22/2014   Procedure: COLONOSCOPY;  Surgeon: Malissa Hippo, MD;  Location: [facility];  Service: Endoscopy;  Laterality: N/A;  830 -- to be done in OR under fluoro   Complete hysterectomy     CORONARY STENT INTERVENTION N/A 11/23/2016   Procedure: Coronary Stent Intervention;  Surgeon: Swaziland, Peter M, MD;  Location: [facility] INVASIVE CV LAB;  Service: Cardiovascular;  Laterality: N/A;   LEFT HEART CATH AND CORONARY ANGIOGRAPHY N/A  11/23/2016   Procedure: Left Heart Cath and Coronary Angiography;  Surgeon: Swaziland, Peter M, MD;  Location: [facility] INVASIVE CV LAB;  Service: Cardiovascular;  Laterality: N/A;   TONSILLECTOMY AND ADENOIDECTOMY     Family History:  Family History  Problem Relation Age of Onset   Heart disease Mother    Aneurysm Father    Lung cancer Brother    Heart disease Sister    Diabetes Brother    Heart disease Brother    Social History:  Social History   Tobacco Use   Smoking status: Former    Current packs/day: 0.00    Average packs/day: 0.3 packs/day for 31.0 years (7.8 ttl pk-yrs)    Types: Cigarettes    Start date: 05/14/1977    Quit date: 05/28/2008    Years since quitting: 14.7   Smokeless tobacco: Never   Tobacco comments:    patient states she only smoked for 3 years total  Vaping Use   Vaping status: Never Used  Substance Use Topics   Alcohol use: No    Alcohol/week: 0.0 standard drinks of alcohol   Drug use: No    Home Medications:  Prior to Admission medications   Medication Sig Start Date End Date Taking? Authorizing Provider  albuterol (VENTOLIN HFA) 108 (90 Base) MCG/ACT inhaler Inhale 2 puffs into the lungs every 6 (six) hours as needed for wheezing or shortness of breath. 11/20/22  Yes Coralyn Helling, MD  alendronate (FOSAMAX) 70 MG tablet Take 1 tablet (70 mg total) by mouth every 7 (seven) days. Take with a full glass of water on an empty stomach. Do not lay down for at least 2 hours 10/22/22  Yes Stacks, Broadus John, MD  Ascorbic Acid (VITAMIN C) 100 MG tablet Take 100 mg by mouth daily.   Yes [provider]  aspirin EC 81 MG tablet Take 81 mg by mouth every morning.    Yes [provider]  carvedilol (COREG) 3.125 MG tablet Take 1 tablet (3.125 mg total) by mouth 2 (two) times daily with a meal. Patient taking differently: Take 3.125 mg by mouth daily as needed (high bp). 07/30/22  Yes Wendall Stade, MD  celecoxib (CELEBREX) 200 MG capsule TAKE ONE CAPSULE BY  MOUTH ONCE DAILY WITH FOOD. Patient taking differently: Take 200 mg by mouth daily as needed for mild pain (pain score 1-3). 01/01/23  Yes Mechele Claude, MD  Cholecalciferol (VITAMIN D) 50 MCG (2000 UT) tablet Take 1 tablet (2,000 Units total) by mouth daily. 10/22/22  Yes Stacks, Broadus John, MD  Fluticasone-Umeclidin-Vilant (TRELEGY ELLIPTA) 100-62.5-25 MCG/ACT AEPB Inhale 1 puff into the lungs daily in the afternoon. 11/20/22  Yes Coralyn Helling, MD  OXYGEN Inhale 2 L into the lungs at bedtime. Can use in the morning as needed   Yes [provider]  rosuvastatin (CRESTOR) 20 MG tablet Take 1 tablet (20 mg total) by mouth daily. 11/13/22  Yes Strader, [place] M, PA-C  sacubitril-valsartan (ENTRESTO) 24-26 MG TAKE 1 TABLET BY MOUTH TWICE DAILY. 01/22/23  Yes Wendall Stade, MD  traMADol (ULTRAM) 50 MG tablet Take 1 tablet (50 mg total) by mouth every 12 (twelve) hours as needed for moderate pain. 10/22/22  Yes Stacks, Broadus John, MD  triamterene-hydrochlorothiazide (MAXZIDE-25) 37.5-25 MG tablet Take 1 tablet by mouth daily. For blood pressure and fluid Patient taking differently: Take 1 tablet by mouth daily as needed (blood pressure and fluid). 04/03/22  Yes Mechele Claude, MD    Inpatient Medications:  Current Facility-Administered Medications:    acetaminophen (TYLENOL) tablet 650 mg, 650 mg, Oral, Q6H PRN **OR** acetaminophen (TYLENOL) suppository 650 mg, 650 mg, Rectal, Q6H PRN, Adefeso, Oladapo, DO   albuterol (PROVENTIL) (2.5 MG/3ML) 0.083% nebulizer solution 3 mL, 3 mL, Nebulization, Q6H PRN, Adefeso, Oladapo, DO   aspirin EC tablet 81 mg, 81 mg, Oral, Daily, Adefeso, Oladapo, DO, 81 mg at 03/01/23 0815   carvedilol (COREG) tablet 3.125 mg, 3.125 mg, Oral, BID WC, Adefeso, Oladapo, DO, 3.125 mg at 03/02/23 0735   clorazepate (TRANXENE) tablet 7.5 mg, 7.5 mg, Oral, Daily PRN, Tat, Onalee Hua, MD, 7.5 mg at 02/28/23 1855   enoxaparin (LOVENOX) injection 40 mg, 40 mg, Subcutaneous, Q24H, Adefeso,  Oladapo, DO, 40 mg at 03/02/23 0558   fluticasone furoate-vilanterol (BREO ELLIPTA) 100-25 MCG/ACT 1 puff, 1 puff, Inhalation, Daily, 1 puff at 03/02/23 0751 **AND** umeclidinium bromide (INCRUSE ELLIPTA) 62.5 MCG/ACT 1 puff, 1 puff, Inhalation, Daily, Adefeso, Oladapo, DO, 1 puff at 03/02/23 0751   HYDROmorphone (DILAUDID) injection 0.5 mg, 0.5 mg, Intravenous, Q4H PRN, Adefeso, Oladapo, DO, 0.5 mg at 03/01/23 0818   isosorbide mononitrate (IMDUR) 24 hr tablet 30 mg, 30 mg, Oral, Daily, Adefeso, Oladapo, DO, 30 mg at 03/01/23 0815   ondansetron (ZOFRAN) tablet 4 mg, 4 mg, Oral, Q6H PRN **OR** ondansetron (ZOFRAN) injection 4 mg, 4 mg, Intravenous, Q6H PRN, Adefeso, Oladapo, DO   piperacillin-tazobactam (ZOSYN) IVPB 3.375 g, 3.375 g, Intravenous, Q8H, Stevphen Rochester, RPH, Last Rate: 12.5 mL/hr at 03/02/23 0555, 3.375 g at 03/02/23 0555   rosuvastatin (CRESTOR) tablet 20 mg, 20 mg, Oral, Daily, Adefeso, Oladapo, DO, 20 mg at 03/01/23 0815   sacubitril-valsartan (ENTRESTO) 24-26 mg per tablet, 1 tablet, Oral, BID, Adefeso, Oladapo, DO, 1 tablet at 03/01/23 2134 Allergies: Plavix [clopidogrel bisulfate], Calcium channel blockers, Alprazolam, Codeine, Percodan [oxycodone-aspirin], Statins, and Valium  Complete Review of Systems: GENERAL: negative for malaise, night sweats HEENT: No changes in hearing or vision, no nose bleeds or other nasal problems. NECK: Negative for lumps, goiter, pain and significant neck swelling RESPIRATORY: Negative for cough, wheezing CARDIOVASCULAR: Negative for chest pain, leg swelling, palpitations, orthopnea GI: SEE HPI MUSCULOSKELETAL: Negative for joint pain or swelling, back pain, and muscle pain. SKIN: Negative for lesions, rash PSYCH: Negative for sleep disturbance, mood disorder and recent psychosocial stressors. HEMATOLOGY Negative for prolonged bleeding, bruising easily, and swollen nodes. ENDOCRINE: Negative for cold or heat intolerance, polyuria, polydipsia  and goiter. NEURO: negative for tremor, gait imbalance, syncope and seizures. The remainder of the review of systems is noncontributory.  Physical Exam: BP 115/60 (BP Location: Right Arm)   Pulse 80   Temp 98.2 F (36.8 C) (Oral)   Resp 18   Ht 5\' 2"  (1.575 m)   Wt 74.8 kg   SpO2 97%   BMI 30.18 kg/m  GENERAL: The patient is  AO x3, in no acute distress. HEENT: Head is normocephalic and atraumatic. EOMI are intact. Mouth is well hydrated and without lesions. NECK: Supple. No masses LUNGS: Clear to auscultation. No presence of rhonchi/wheezing/rales. Adequate chest expansion HEART: RRR, normal s1 and s2. ABDOMEN: tender to palpation on lower abdomen diffusely, no guarding, no peritoneal signs, and nondistended. BS +. No masses. EXTREMITIES: Without any cyanosis, clubbing, rash, lesions or edema. NEUROLOGIC: AOx3, no focal motor deficit. SKIN: no jaundice, no rashes  Laboratory Data CBC:     Component Value Date/Time   WBC 6.3 03/02/2023 0355   RBC 4.26 03/02/2023 0355   HGB 11.5 (L) 03/02/2023 0355   HGB 13.0 10/22/2022 1039   HCT 36.7 03/02/2023 0355   HCT 40.1 10/22/2022 1039   PLT 289 03/02/2023 0355   PLT 357 10/22/2022 1039   MCV 86.2 03/02/2023 0355   MCV 84 10/22/2022 1039   MCH 27.0 03/02/2023 0355   MCHC 31.3 03/02/2023 0355   RDW 14.6 03/02/2023 0355   RDW 14.9 10/22/2022 1039   LYMPHSABS 1.7 02/03/2023 0826   LYMPHSABS 2.5 10/22/2022 1039   MONOABS 1.1 (H) 02/03/2023 0826   EOSABS 0.3 02/03/2023 0826   EOSABS 0.3 10/22/2022 1039   BASOSABS 0.0 02/03/2023 0826   BASOSABS 0.0 10/22/2022 1039   COAG:  Lab Results  Component Value Date   INR 1.0 07/13/2021   INR 1.02 11/11/2017   INR 1.03 11/23/2016    BMP:     Latest Ref Rng & Units 03/02/2023    3:55 AM 03/01/2023    4:33 AM 02/28/2023    3:13 AM  BMP  Glucose 70 - 99 mg/dL 161  98  99   BUN 8 - 23 mg/dL 5  6  9    Creatinine 0.44 - 1.00 mg/dL 0.96  0.45  4.09   Sodium 135 - 145 mmol/L 138   136  136   Potassium 3.5 - 5.1 mmol/L 3.6  3.4  3.4   Chloride 98 - 111 mmol/L 105  102  101   CO2 22 - 32 mmol/L 25  26  26    Calcium 8.9 - 10.3 mg/dL 9.0  9.1  9.4     HEPATIC:     Latest Ref Rng & Units 02/28/2023    3:13 AM 02/27/2023   11:56 AM 02/03/2023    8:26 AM  Hepatic Function  Total Protein 6.5 - 8.1 g/dL 6.0  6.6  6.3   Albumin 3.5 - 5.0 g/dL 3.5  4.0  3.6   AST 15 - 41 U/L 16  19  17    ALT 0 - 44 U/L 13  14  11    Alk Phosphatase 38 - 126 U/L 36  40  45   Total Bilirubin 0.3 - 1.2 mg/dL 0.9  0.6  1.1     CARDIAC:  Lab Results  Component Value Date   TROPONINI <0.03 04/25/2018     Imaging: I personally reviewed and interpreted the available imaging.  Assessment & Plan: Morenike Percell Schwanke is a 87 y.o. female with history of anxiety, coronary artery disease disease status post stent placement, COPD, CHF, hypertension, hyperlipidemia, who was admitted to the hospital after presenting worsening abdominal pain and found to have acute diverticulitis.  Gastroenterology was consulted for evaluation of right lower quadrant abdominal pain and possible melena.  The patient was found to have uncomplicated diverticulitis on most recent CT scan and is currently on broad antibiotic coverage.  It seems that her abdominal  pain has improved on the left side but she is still having some pain in the right lower side of the abdomen.  I explained that this is possibly related to her healing diverticulitis.  We will continue her on antibiotic coverage.  Given the chronicity of her symptoms she may need an extended antibiotic course up to 2 weeks depending how she is clinically doing.  She can have dicyclomine as needed for abdominal pain.  If her pain were to worsen, she will need to have a repeat CT scan of the abdomen and pelvis with IV contrast.  Ultimately, we will need to plan for repeat colonoscopy in  8 weeks as outpatient.  She is also endorsing episodes of melena but her stool today did  not have melena appearance.  She has mild anemia but this has not been worsening during the current admission.  I will start her on pantoprazole 40 mg every day and we will monitor her clinical status.  If she presents with worsening anemia, may need to proceed with an EGD during the current hospitalization versus outpatient EGD at the same time of colonoscopy.  - Repeat CBC q day, transfuse if Hb <7 - Pantoprazole 40 mg qday PO -Continue antibiotic coverage, may need 2-week course depending on clinical improvement -Start Bentyl as needed for abdominal pain. - 2 large bore IV lines - Active T/S - Avoid NSAIDs -May consider inpatient EGD if worsening anemia -Will need outpatient colonoscopy  8 weeks after episode of diverticulitis  Katrinka Blazing, MD Gastroenterology and Hepatology [facility] Gastroenterology

## 2023-03-02 NOTE — Plan of Care (Signed)

## 2023-03-03 ENCOUNTER — Telehealth (INDEPENDENT_AMBULATORY_CARE_PROVIDER_SITE_OTHER): Payer: Self-pay | Admitting: Gastroenterology

## 2023-03-03 DIAGNOSIS — K5792 Diverticulitis of intestine, part unspecified, without perforation or abscess without bleeding: Principal | ICD-10-CM

## 2023-03-03 DIAGNOSIS — E669 Obesity, unspecified: Secondary | ICD-10-CM | POA: Diagnosis not present

## 2023-03-03 DIAGNOSIS — I5022 Chronic systolic (congestive) heart failure: Secondary | ICD-10-CM | POA: Diagnosis not present

## 2023-03-03 LAB — BASIC METABOLIC PANEL
Anion gap: 7 (ref 5–15)
BUN: 6 mg/dL — ABNORMAL LOW (ref 8–23)
CO2: 25 mmol/L (ref 22–32)
Calcium: 9 mg/dL (ref 8.9–10.3)
Chloride: 106 mmol/L (ref 98–111)
Creatinine, Ser: 0.68 mg/dL (ref 0.44–1.00)
GFR, Estimated: >60 mL/min (ref >60)
Glucose, Bld: 92 mg/dL (ref 70–99)
Potassium: 3.6 mmol/L (ref 3.5–5.1)
Sodium: 138 mmol/L (ref 135–145)

## 2023-03-03 LAB — CBC
HCT: 34.2 % — ABNORMAL LOW (ref 36.0–46.0)
Hemoglobin: 10.9 g/dL — ABNORMAL LOW (ref 12.0–15.0)
MCH: 27.2 pg (ref 26.0–34.0)
MCHC: 31.9 g/dL (ref 30.0–36.0)
MCV: 85.3 fL (ref 80.0–100.0)
Platelets: 280 10*3/uL (ref 150–400)
RBC: 4.01 MIL/uL (ref 3.87–5.11)
RDW: 14.8 % (ref 11.5–15.5)
WBC: 6.4 10*3/uL (ref 4.0–10.5)
nRBC: 0 % (ref 0.0–0.2)

## 2023-03-03 MED ORDER — DICYCLOMINE HCL 10 MG PO CAPS: 10.0000 mg | ORAL_CAPSULE | Freq: Three times a day (TID) | ORAL | 1 refills | Status: DC | PRN

## 2023-03-03 MED ORDER — AMOXICILLIN-POT CLAVULANATE 875-125 MG PO TABS
1.0000 | ORAL_TABLET | Freq: Two times a day (BID) | ORAL | Status: DC
  Administered 2023-03-03: 1 via ORAL
  Filled 2023-03-03 (×2): qty 1

## 2023-03-03 MED ORDER — PANTOPRAZOLE SODIUM 40 MG PO TBEC: 40.0000 mg | DELAYED_RELEASE_TABLET | Freq: Every day | ORAL | 1 refills | Status: AC

## 2023-03-03 MED ORDER — AMOXICILLIN-POT CLAVULANATE 875-125 MG PO TABS: 1.0000 | ORAL_TABLET | Freq: Two times a day (BID) | ORAL | 0 refills | Status: DC

## 2023-03-03 NOTE — Progress Notes (Signed)
[name], M.D. Gastroenterology & Hepatology   Interval History:  NAEON. Patient reported feeling somewhat better and denied having any nausea or vomiting.  Was able to tolerate diet.  Still presenting pain in her lower abdomen but is better controlled today.  No melena or hematochezia.  Inpatient Medications:  Current Facility-Administered Medications:    acetaminophen (TYLENOL) tablet 650 mg, 650 mg, Oral, Q6H PRN **OR** acetaminophen (TYLENOL) suppository 650 mg, 650 mg, Rectal, Q6H PRN, Adefeso, Oladapo, DO   albuterol (PROVENTIL) (2.5 MG/3ML) 0.083% nebulizer solution 3 mL, 3 mL, Nebulization, Q6H PRN, Adefeso, Oladapo, DO   aspirin EC tablet 81 mg, 81 mg, Oral, Daily, Adefeso, Oladapo, DO, 81 mg at 03/03/23 0853   carvedilol (COREG) tablet 3.125 mg, 3.125 mg, Oral, BID WC, Adefeso, Oladapo, DO, 3.125 mg at 03/03/23 7829   clorazepate (TRANXENE) tablet 7.5 mg, 7.5 mg, Oral, Daily PRN, Tat, David, MD, 7.5 mg at 02/28/23 1855   dicyclomine (BENTYL) capsule 10 mg, 10 mg, Oral, Q8H PRN, Marguerita Merles, Monisha Siebel, MD, 10 mg at 03/02/23 2205   enoxaparin (LOVENOX) injection 40 mg, 40 mg, Subcutaneous, Q24H, Adefeso, Oladapo, DO, 40 mg at 03/03/23 0620   fluticasone furoate-vilanterol (BREO ELLIPTA) 100-25 MCG/ACT 1 puff, 1 puff, Inhalation, Daily, 1 puff at 03/03/23 0839 **AND** umeclidinium bromide (INCRUSE ELLIPTA) 62.5 MCG/ACT 1 puff, 1 puff, Inhalation, Daily, Adefeso, Oladapo, DO, 1 puff at 03/03/23 0839   HYDROmorphone (DILAUDID) injection 0.5 mg, 0.5 mg, Intravenous, Q4H PRN, Adefeso, Oladapo, DO, 0.5 mg at 03/02/23 2204   isosorbide mononitrate (IMDUR) 24 hr tablet 30 mg, 30 mg, Oral, Daily, Adefeso, Oladapo, DO, 30 mg at 03/03/23 0853   ondansetron (ZOFRAN) tablet 4 mg, 4 mg, Oral, Q6H PRN **OR** ondansetron (ZOFRAN) injection 4 mg, 4 mg, Intravenous, Q6H PRN, Adefeso, Oladapo, DO, 4 mg at 03/02/23 2205   pantoprazole (PROTONIX) EC tablet 40 mg, 40 mg, Oral, Daily, Marguerita Merles, Jameela Michna, MD, 40 mg at 03/03/23 0853   piperacillin-tazobactam (ZOSYN) IVPB 3.375 g, 3.375 g, Intravenous, Q8H, Stevphen Rochester, RPH, Last Rate: 12.5 mL/hr at 03/03/23 0620, 3.375 g at 03/03/23 0620   rosuvastatin (CRESTOR) tablet 20 mg, 20 mg, Oral, Daily, Adefeso, Oladapo, DO, 20 mg at 03/03/23 0853   sacubitril-valsartan (ENTRESTO) 24-26 mg per tablet, 1 tablet, Oral, BID, Adefeso, Oladapo, DO, 1 tablet at 03/03/23 5621   I/O    Intake/Output Summary (Last 24 hours) at 03/03/2023 1125 Last data filed at 03/03/2023 0831 Gross per 24 hour  Intake 240 ml  Output --  Net 240 ml     Physical Exam: Temp:  [98 F (36.7 C)-98.6 F (37 C)] 98.6 F (37 C) (10/20 0853) Pulse Rate:  [67-87] 74 (10/20 0853) Resp:  [18] 18 (10/20 0853) BP: (97-136)/(49-92) 131/60 (10/20 0853) SpO2:  [94 %-96 %] 96 % (10/20 0853)  Temp (24hrs), Avg:98.2 F (36.8 C), Min:98 F (36.7 C), Max:98.6 F (37 C) GENERAL: The patient is AO x3, in no acute distress. HEENT: Head is normocephalic and atraumatic. EOMI are intact. Mouth is well hydrated and without lesions. NECK: Supple. No masses LUNGS: Clear to auscultation. No presence of rhonchi/wheezing/rales. Adequate chest expansion HEART: RRR, normal s1 and s2. ABDOMEN: moderately tender to palpation on lower abdomen diffusely, no guarding, no peritoneal signs, and nondistended. BS +. No masses. EXTREMITIES: Without any cyanosis, clubbing, rash, lesions or edema. NEUROLOGIC: AOx3, no focal motor deficit. SKIN: no jaundice, no rashes  Laboratory Data: CBC:     Component Value Date/Time   WBC  6.4 03/03/2023 0455   RBC 4.01 03/03/2023 0455   HGB 10.9 (L) 03/03/2023 0455   HGB 13.0 10/22/2022 1039   HCT 34.2 (L) 03/03/2023 0455   HCT 40.1 10/22/2022 1039   PLT 280 03/03/2023 0455   PLT 357 10/22/2022 1039   MCV 85.3 03/03/2023 0455   MCV 84 10/22/2022 1039   MCH 27.2 03/03/2023 0455   MCHC 31.9 03/03/2023 0455   RDW 14.8 03/03/2023 0455   RDW  14.9 10/22/2022 1039   LYMPHSABS 1.7 02/03/2023 0826   LYMPHSABS 2.5 10/22/2022 1039   MONOABS 1.1 (H) 02/03/2023 0826   EOSABS 0.3 02/03/2023 0826   EOSABS 0.3 10/22/2022 1039   BASOSABS 0.0 02/03/2023 0826   BASOSABS 0.0 10/22/2022 1039   COAG:  Lab Results  Component Value Date   INR 1.0 07/13/2021   INR 1.02 11/11/2017   INR 1.03 11/23/2016    BMP:     Latest Ref Rng & Units 03/03/2023    4:55 AM 03/02/2023    3:55 AM 03/01/2023    4:33 AM  BMP  Glucose 70 - 99 mg/dL 92  161  98   BUN 8 - 23 mg/dL 6  <5  6   Creatinine 0.96 - 1.00 mg/dL 0.45  4.09  8.11   Sodium 135 - 145 mmol/L 138  138  136   Potassium 3.5 - 5.1 mmol/L 3.6  3.6  3.4   Chloride 98 - 111 mmol/L 106  105  102   CO2 22 - 32 mmol/L 25  25  26    Calcium 8.9 - 10.3 mg/dL 9.0  9.0  9.1     HEPATIC:     Latest Ref Rng & Units 02/28/2023    3:13 AM 02/27/2023   11:56 AM 02/03/2023    8:26 AM  Hepatic Function  Total Protein 6.5 - 8.1 g/dL 6.0  6.6  6.3   Albumin 3.5 - 5.0 g/dL 3.5  4.0  3.6   AST 15 - 41 U/L 16  19  17    ALT 0 - 44 U/L 13  14  11    Alk Phosphatase 38 - 126 U/L 36  40  45   Total Bilirubin 0.3 - 1.2 mg/dL 0.9  0.6  1.1     CARDIAC:  Lab Results  Component Value Date   TROPONINI <0.03 04/25/2018      Imaging: I personally reviewed and interpreted the available labs, imaging and endoscopic files.   Assessment/Plan: [name] is a 87 y.o. female with history of anxiety, coronary artery disease disease status post stent placement, COPD, CHF, hypertension, hyperlipidemia, who was admitted to the hospital after presenting worsening abdominal pain and found to have acute diverticulitis. Patient failed outpatient treatment for diverticulitis with ciprofloxacin and flagyl. Gastroenterology was consulted for evaluation of right lower quadrant abdominal pain and possible melena.   The patient was found to have uncomplicated diverticulitis on most recent CT scan and is currently on broad  antibiotic coverage.  It seems that her abdominal pain has improved on the left side but she is still having some pain in the right lower side of the abdomen.  I explained that this is possibly related to her healing diverticulitis.  We will continue her on antibiotic coverage.  Given the chronicity of her symptoms she will need an extended antibiotic course for 14 days - may be discharged on Augmentin BID.  She will continue with dicyclomine as needed for abdominal pain.  If her  pain were to worsen, she will need to have a repeat CT scan of the abdomen and pelvis with IV contrast.  I also discussed with her we will need to plan for repeat colonoscopy in  8 weeks as outpatient.   She also reported some episode of melena but the stool during the current hospitalization has been dark brown.  Hemoglobin has she will need to be continued on pantoprazole 40 mg every day and we will monitor her clinical status.  Will also recommend repeating a CBC on follow-up appointment as outpatient and consider performing an outpatient EGD at the same time of colonoscopy if symptoms are persisting.   - Repeat CBC q day, transfuse if Hb <7 - Pantoprazole 40 mg qday PO -Continue antibiotic coverage, will need 2-week course, discharged on Augmentin twice daily -Bentyl as needed for abdominal pain. - 2 large bore IV lines - Active T/S - Avoid NSAIDs -May consider inpatient EGD if persistent anemia -Will need outpatient colonoscopy  8 weeks after episode of diverticulitis - Patient will follow up in GI clinic in 3-4 weeks. - GI service will sign-off, please call us back if you have any more questions.  [name] Blazing, MD Gastroenterology and Hepatology [facility] Gastroenterology

## 2023-03-03 NOTE — Telephone Encounter (Signed)
Hi Mitzie,  Can you please schedule a follow up appointment for this patient in 3-4 with me or any of the APPs (preferable Tobi Bastos)?  Thanks,  Katrinka Blazing, MD Gastroenterology and Hepatology [facility] Gastroenterology

## 2023-03-03 NOTE — Discharge Summary (Signed)
Physician Discharge Summary   Patient: [name] MRN: [id number] DOB: 05/25/1935  Admit date:     02/27/2023  Discharge date: 03/03/23  Discharge Physician: Onalee Hua Makenlee Mckeag   PCP: Mechele Claude, MD   Recommendations at discharge:   Please follow up with primary care provider within 1-2 weeks  Please repeat BMP and CBC in one week    Hospital Course: 87 year old female with a history of coronary artery disease (s/p DES to mid-LAD and DES to mid-OM1 in 11/2016, HFrEF (EF 35-40%), hypertension, hyperlipidemia, dilation of ascending aorta (4.3 cm by imaging 01/2022), and COPD presenting with lower abdominal pain for the better part of a month.  Patient was initially seen in the ED on 02/03/2023 for LLQ abdominal pain.  CT of the abdomen and pelvis on the day showed mild sigmoid diverticulitis without perforation.  The patient was discharged home in stable condition with Augmentin.  She followed up with her PCP on 02/12/2023.  The patient continued to have abdominal pain at that time with some hematochezia.  The patient was given a course of ciprofloxacin and metronidazole which she has finished.  She stated that she had some improvement in her left lower quadrant abdominal pain, but has developed right lower quadrant abdominal pain.  She continues to have some loose stool that she describes as pinkish.  Has had some subjective fevers and chills.  She has some nausea without emesis.  She denies any frank chest pain, shortness breath, hemoptysis.  She describes some fluttering in her chest occasionally. In the ED, the patient was afebrile hemodynamically stable with oxygen saturation 95% on room air.  WBC 8.6, hemoglobin 12.0, platelets 314.  Sodium 136, potassium 3.4, bicarbonate 24, serum creatinine 0.64.  LFTs unremarkable.  Lipase 29.  CT of the abdomen and pelvis showed mild stranding about a diverticulum in the distal descending colon consistent with diverticulitis.  There is no perforation nor abscess.   Patient started on zosyn  Assessment and Plan:  Acute diverticulitis -Patient had failure to outpatient antibiotics -continue IV Zosyn>>switched to augmentin 10/20 -Clear Liquid diet>>full liquids>>>soft diet which pt tolerated -Judicious opioids -appreciate GI consult>>add bentyl, colonoscopy in 8 weeks -cdiff neg -stool pathogen panel if has another loose stool -UA--no pyuria -pt given augmentin dose 10/20 AM and given an extra dose for 10/20 evening to take at home;  she was instructed to go to [facility] in early am 10/21 and pick up her augmentin prescription and start taking at that time -pt given 13 more days Rx amox/clav .dtex   Coronary artery disease Continue aspirin, carvedilol, statin   Chronic HFrEF -01/25/2022 echo EF 35-40%, global HK, normal RVF -Continue Entresto, carvedilol, Imdur -Clinically euvolemic    History of Presyncope - Prior monitor as discussed above showed episodes of VT with the longest lasting for 16 beats and she did have PVC's with a 3.6% burden. Findings were reviewed with Dr. Eden Emms who recommended medical therapy at that time. She denies any recurrent presyncopal episodes. Remains on Coreg 3.125 mg twice daily given intermittent palpitations (had bradycardia with higher dosing).    Hyperlipidemia -Continue Crestor   COPD -Stable on room air -continue Trelegy   Chronic respiratory failure with hypoxia from COPD. - 2 liters with exertion and sleep - goal SpO2 > 90%   Obesity -BMI 30.18 -lifestyle modification   Hypokalemia -replete -mag 2.0      Consultants: GI Procedures performed: none  Disposition: Home Diet recommendation:  soft DISCHARGE MEDICATION: Allergies as of  03/03/2023       Reactions   Plavix [clopidogrel Bisulfate] Itching   Severe itching   Calcium Channel Blockers Other (See Comments)   cramping   Alprazolam Nausea And Vomiting   Codeine Nausea And Vomiting   Percodan [oxycodone-aspirin] Nausea  And Vomiting   Statins Other (See Comments)   Muscle cramps   Valium Nausea And Vomiting        Medication List     STOP taking these medications    celecoxib 200 MG capsule Commonly known as: CELEBREX       TAKE these medications    albuterol 108 (90 Base) MCG/ACT inhaler Commonly known as: Ventolin HFA Inhale 2 puffs into the lungs every 6 (six) hours as needed for wheezing or shortness of breath.   alendronate 70 MG tablet Commonly known as: FOSAMAX Take 1 tablet (70 mg total) by mouth every 7 (seven) days. Take with a full glass of water on an empty stomach. Do not lay down for at least 2 hours   amoxicillin-clavulanate 875-125 MG tablet Commonly known as: AUGMENTIN Take 1 tablet by mouth every 12 (twelve) hours.   aspirin EC 81 MG tablet Take 81 mg by mouth every morning.   carvedilol 3.125 MG tablet Commonly known as: Coreg Take 1 tablet (3.125 mg total) by mouth 2 (two) times daily with a meal. What changed:  when to take this reasons to take this   dicyclomine 10 MG capsule Commonly known as: BENTYL Take 1 capsule (10 mg total) by mouth every 8 (eight) hours as needed (abdominal pain).   Entresto 24-26 MG Generic drug: sacubitril-valsartan TAKE 1 TABLET BY MOUTH TWICE DAILY.   OXYGEN Inhale 2 L into the lungs at bedtime. Can use in the morning as needed   pantoprazole 40 MG tablet Commonly known as: PROTONIX Take 1 tablet (40 mg total) by mouth daily. Start taking on: March 04, 2023   rosuvastatin 20 MG tablet Commonly known as: Crestor Take 1 tablet (20 mg total) by mouth daily.   traMADol 50 MG tablet Commonly known as: ULTRAM Take 1 tablet (50 mg total) by mouth every 12 (twelve) hours as needed for moderate pain.   Trelegy Ellipta 100-62.5-25 MCG/ACT Aepb Generic drug: Fluticasone-Umeclidin-Vilant Inhale 1 puff into the lungs daily in the afternoon.   triamterene-hydrochlorothiazide 37.5-25 MG tablet Commonly known as:  MAXZIDE-25 Take 1 tablet by mouth daily. For blood pressure and fluid What changed:  when to take this reasons to take this additional instructions   vitamin C 100 MG tablet Take 100 mg by mouth daily.   Vitamin D 50 MCG (2000 UT) tablet Take 1 tablet (2,000 Units total) by mouth daily.        Discharge Exam: Filed Weights   02/27/23 1138  Weight: 74.8 kg   HEENT:  [place]/AT, No thrush, no icterus CV:  RRR, no rub, no S3, no S4 Lung:  CTA, no wheeze, no rhonchi Abd:  soft/+BS, RLQ mild tender Ext:  No edema, no lymphangitis, no synovitis, no rash   Condition at discharge: stable  The results of significant diagnostics from this hospitalization (including imaging, microbiology, ancillary and laboratory) are listed below for reference.   Imaging Studies: CT ABDOMEN PELVIS W CONTRAST  Result Date: 02/27/2023 CLINICAL DATA:  Left lower quadrant abdominal pain, right lower quadrant and diarrhea. History of diverticulitis. EXAM: CT ABDOMEN AND PELVIS WITH CONTRAST TECHNIQUE: Multidetector CT imaging of the abdomen and pelvis was performed using the standard protocol following bolus  administration of intravenous contrast. RADIATION DOSE REDUCTION: This exam was performed according to the departmental dose-optimization program which includes automated exposure control, adjustment of the mA and/or kV according to patient size and/or use of iterative reconstruction technique. CONTRAST:  OMNIPAQUE IOHEXOL 300 MG/ML  SOLN COMPARISON:  CT abdomen and pelvis 02/03/2023 FINDINGS: Lower chest: No acute abnormality. Hepatobiliary: Cholecystectomy Buttock cysts/hemangiomas. Prominent bile duct due to reservoir effect. Pancreas: Unremarkable. Spleen: Unremarkable. Adrenals/Urinary Tract: Stable adrenal glands and kidneys. No urinary calculi or hydronephrosis. Cystocele of the bladder. Stomach/Bowel: Normal caliber large and small bowel. Colonic diverticulosis. Mild stranding about diverticulum  distal descending colon (series 4/image 30). No abscess or perforation. The appendix is not definitively visualized. No secondary signs of appendicitis. Small hiatal. Vascular/Lymphatic: Aortic atherosclerosis. No enlarged abdominal or pelvic lymph nodes. Reproductive: Hysterectomy. Other: No free intraperitoneal fluid or air. Musculoskeletal: No acute fracture IMPRESSION: 1. Mild stranding about a diverticulum in distal descending colon compatible with diverticulitis. No abscess or perforation. 2. Pelvic floor laxity with cystocele of the bladder. Electronically Signed   By: Minerva Fester M.D.   On: 02/27/2023 22:52   CT ABDOMEN PELVIS W CONTRAST  Result Date: 02/03/2023 CLINICAL DATA:  Worsening bilateral lower quadrant pain. Currently on antibiotic therapy for urinary tract infection. EXAM: CT ABDOMEN AND PELVIS WITH CONTRAST TECHNIQUE: Multidetector CT imaging of the abdomen and pelvis was performed using the standard protocol following bolus administration of intravenous contrast. RADIATION DOSE REDUCTION: This exam was performed according to the departmental dose-optimization program which includes automated exposure control, adjustment of the mA and/or kV according to patient size and/or use of iterative reconstruction technique. CONTRAST:  OMNIPAQUE IOHEXOL 300 MG/ML  SOLN COMPARISON:  11/11/2017 FINDINGS: Lower Chest: No acute findings.  Stable cardiomegaly. Hepatobiliary: Stable 2.7 cm benign hemangioma in the posterior right hepatic lobe. Probable tiny cyst near the junction of the right and left lobes also unchanged. No new or enlarging liver lesions identified. Prior cholecystectomy. No evidence of biliary obstruction. Pancreas:  No mass or inflammatory changes. Spleen: Within normal limits in size and appearance. Adrenals/Urinary Tract: No suspicious masses identified. No evidence of ureteral calculi or hydronephrosis. Pelvic floor laxity with small cystocele noted. Otherwise unremarkable  appearance of urinary bladder. Stomach/Bowel: Mild sigmoid diverticulitis is seen. No evidence of bowel perforation or abscess. Vascular/Lymphatic: No pathologically enlarged lymph nodes. No acute vascular findings. Reproductive: Prior hysterectomy noted. Adnexal regions are unremarkable in appearance. Other:  None. Musculoskeletal:  No suspicious bone lesions identified. IMPRESSION: Mild sigmoid diverticulitis. No evidence of bowel perforation or abscess. Pelvic floor laxity with small cystocele. Electronically Signed   By: Danae Orleans M.D.   On: 02/03/2023 09:51    Microbiology: Results for orders placed or performed during the hospital encounter of 02/27/23  Culture, blood (Routine X 2) w Reflex to ID Panel     Status: None (Preliminary result)   Collection Time: 02/28/23  7:14 AM   Specimen: BLOOD  Result Value Ref Range Status   Specimen Description BLOOD BLOOD RIGHT ARM  Final   Special Requests   Final    BOTTLES DRAWN AEROBIC AND ANAEROBIC Blood Culture results may not be optimal due to an excessive volume of blood received in culture bottles   Culture   Final    NO GROWTH 3 DAYS Performed at [facility], [address] [place], [place] [address]    Report Status PENDING  Incomplete  Culture, blood (Routine X 2) w Reflex to ID Panel  Status: None (Preliminary result)   Collection Time: 02/28/23  7:14 AM   Specimen: BLOOD  Result Value Ref Range Status   Specimen Description BLOOD BLOOD RIGHT HAND  Final   Special Requests   Final    BOTTLES DRAWN AEROBIC AND ANAEROBIC Blood Culture results may not be optimal due to an excessive volume of blood received in culture bottles   Culture   Final    NO GROWTH 3 DAYS Performed at [facility], [address] [place], [place] [address]    Report Status PENDING  Incomplete  C Difficile Quick Screen w PCR reflex     Status: None   Collection Time: 02/28/23  9:38 AM   Specimen: STOOL  Result Value Ref Range Status   C Diff antigen  NEGATIVE NEGATIVE Final   C Diff toxin NEGATIVE NEGATIVE Final   C Diff interpretation No C. difficile detected.  Final    Comment: Performed at [facility], [address], [place], [place] [address]    Labs: CBC: Recent Labs  Lab 02/27/23 1156 02/28/23 0313 03/01/23 0433 03/02/23 0355 03/03/23 0455  WBC 8.6 6.7 6.2 6.3 6.4  HGB 12.0 11.6* 10.9* 11.5* 10.9*  HCT 37.6 36.4 34.6* 36.7 34.2*  MCV 85.3 84.3 84.4 86.2 85.3  PLT 314 287 280 289 280   Basic Metabolic Panel: Recent Labs  Lab 02/27/23 1156 02/28/23 0313 03/01/23 0433 03/02/23 0355 03/03/23 0455  NA 139 136 136 138 138  K 4.2 3.4* 3.4* 3.6 3.6  CL 101 101 102 105 106  CO2 27 26 26 25 25   GLUCOSE 108* 99 98 100* 92  BUN 10 9 6* <5* 6*  CREATININE 0.62 0.64 0.69 0.70 0.68  CALCIUM 10.0 9.4 9.1 9.0 9.0  MG  --  2.1 2.0 2.0  --   PHOS  --  3.0  --   --   --    Liver Function Tests: Recent Labs  Lab 02/27/23 1156 02/28/23 0313  AST 19 16  ALT 14 13  ALKPHOS 40 36*  BILITOT 0.6 0.9  PROT 6.6 6.0*  ALBUMIN 4.0 3.5   CBG: No results for input(s): "GLUCAP" in the last 168 hours.  Discharge time spent: greater than 30 minutes.  Signed: Catarina Hartshorn, MD [facility] 03/03/2023

## 2023-03-04 ENCOUNTER — Encounter: Payer: Self-pay | Admitting: Cardiovascular Disease

## 2023-03-04 ENCOUNTER — Ambulatory Visit: Payer: 59 | Attending: Cardiovascular Disease | Admitting: Cardiovascular Disease

## 2023-03-04 VITALS — BP 130/72 | HR 87 | Ht 62.0 in | Wt 164.0 lb

## 2023-03-04 DIAGNOSIS — I10 Essential (primary) hypertension: Secondary | ICD-10-CM | POA: Diagnosis not present

## 2023-03-04 DIAGNOSIS — I5020 Unspecified systolic (congestive) heart failure: Secondary | ICD-10-CM

## 2023-03-04 DIAGNOSIS — I25118 Atherosclerotic heart disease of native coronary artery with other forms of angina pectoris: Secondary | ICD-10-CM | POA: Diagnosis not present

## 2023-03-04 NOTE — Patient Instructions (Signed)
Medication Instructions:  Your physician recommends that you continue on your current medications as directed. Please refer to the Current Medication list given to you today.   Labwork: None today  Testing/Procedures: None today  Follow-Up: 1 year Dr.Nishan  Any Other Special Instructions Will Be Listed Below (If Applicable).  If you need a refill on your cardiac medications before your next appointment, please call your pharmacy.  

## 2023-03-05 ENCOUNTER — Telehealth: Payer: Self-pay

## 2023-03-05 LAB — CULTURE, BLOOD (ROUTINE X 2)
Culture: NO GROWTH
Culture: NO GROWTH

## 2023-03-05 NOTE — Transitions of Care (Post Inpatient/ED Visit) (Signed)
03/05/2023  Name: [name] MRN: [id number] DOB: Jun 26, 1935  Today's TOC FU Call Status: Today's TOC FU Call Status:: Successful TOC FU Call Completed TOC FU Call Complete Date: 03/05/23 Patient's Name and Date of Birth confirmed.  Transition Care Management Follow-up Telephone Call Date of Discharge: 03/03/23 [facility] Type of Discharge: Inpatient Admission Primary Inpatient Discharge Diagnosis:: Acute Diverticulitis How have you been since you were released from the hospital?: Same (Patient notes she is still having some abdominal pain, it comes and goes.  She is trying to eat a little.) Any questions or concerns?: No  Items Reviewed: Did you receive and understand the discharge instructions provided?: Yes Medications obtained,verified, and reconciled?: Yes (Medications Reviewed) Any new allergies since your discharge?: No Dietary orders reviewed?: Yes Type of Diet Ordered:: Heart Health low sodium Do you have support at home?: Yes Name of Support/Comfort Primary Source: Junious Dresser  Medications Reviewed Today: Medications Reviewed Today     Reviewed by Jodelle Gross, RN (Case Manager) on 03/05/23 at 1610  Med List Status: <None>   Medication Order Taking? Sig Documenting Provider Last Dose Status Informant  albuterol (VENTOLIN HFA) 108 (90 Base) MCG/ACT inhaler [id number] Yes Inhale 2 puffs into the lungs every 6 (six) hours as needed for wheezing or shortness of breath. Coralyn Helling, MD Taking Active Self  alendronate (FOSAMAX) 70 MG tablet [id number] Yes Take 1 tablet (70 mg total) by mouth every 7 (seven) days. Take with a full glass of water on an empty stomach. Do not lay down for at least 2 hours Mechele Claude, MD Taking Active Self  amoxicillin-clavulanate (AUGMENTIN) 875-125 MG tablet [id number] Yes Take 1 tablet by mouth every 12 (twelve) hours. Catarina Hartshorn, MD Taking Active   Ascorbic Acid (VITAMIN C) 100 MG tablet [id number] Yes Take 100 mg  by mouth daily. [provider] Taking Active Self, Pharmacy Records  aspirin EC 81 MG tablet [id number] Yes Take 81 mg by mouth every morning.  [provider] Taking Active Self           Med Note Elesa Massed, Dava Najjar Feb 28, 2023  2:11 PM)    carvedilol (COREG) 3.125 MG tablet [id number] Yes Take 1 tablet (3.125 mg total) by mouth 2 (two) times daily with a meal.  Patient taking differently: Take 3.125 mg by mouth daily as needed (high bp).   Wendall Stade, MD Taking Active Self  Cholecalciferol (VITAMIN D) 50 MCG (2000 UT) tablet [id number] Yes Take 1 tablet (2,000 Units total) by mouth daily. Mechele Claude, MD Taking Active Self  dicyclomine (BENTYL) 10 MG capsule [id number] Yes Take 1 capsule (10 mg total) by mouth every 8 (eight) hours as needed (abdominal pain). Catarina Hartshorn, MD Taking Active   Fluticasone-Umeclidin-Vilant [facility] ELLIPTA) 100-62.5-25 MCG/ACT AEPB [id number] Yes Inhale 1 puff into the lungs daily in the afternoon. Coralyn Helling, MD Taking Active Self  OXYGEN [id number] Yes Inhale 2 L into the lungs at bedtime. Can use in the morning as needed [provider] Taking Active Self, Pharmacy Records           Med Note [facility], CARLOS A   Fri Aug 25, 2021 10:48 AM)    pantoprazole (PROTONIX) 40 MG tablet [id number] Yes Take 1 tablet (40 mg total) by mouth daily. Catarina Hartshorn, MD Taking Active   rosuvastatin (CRESTOR) 20 MG tablet [id number] Yes Take 1 tablet (20 mg total) by mouth daily. Ellsworth Lennox, PA-C Taking Active Self  sacubitril-valsartan (ENTRESTO) 24-26 MG [id number] Yes TAKE 1 TABLET BY MOUTH TWICE DAILY. Wendall Stade, MD Taking Active Self  traMADol Janean Sark) 50 MG tablet [id number] Yes Take 1 tablet (50 mg total) by mouth every 12 (twelve) hours as needed for moderate pain. Mechele Claude, MD Taking Active Self  triamterene-hydrochlorothiazide (MAXZIDE-25) 37.5-25 MG tablet [id number] Yes Take 1 tablet by mouth daily. For blood  pressure and fluid  Patient taking differently: Take 1 tablet by mouth daily as needed (blood pressure and fluid).   Mechele Claude, MD Taking Active Self            Home Care and Equipment/Supplies: Were Home Health Services Ordered?: No Any new equipment or medical supplies ordered?: No  Functional Questionnaire: Do you need assistance with bathing/showering or dressing?: No Do you need assistance with meal preparation?: No Do you need assistance with eating?: No Do you have difficulty maintaining continence: No Do you need assistance with getting out of bed/getting out of a chair/moving?: No Do you have difficulty managing or taking your medications?: No  Follow up appointments reviewed: PCP Follow-up appointment confirmed?: Yes Date of PCP follow-up appointment?: 03/12/23 Follow-up Provider: Dr. Darlyn Read Specialist [facility] Follow-up appointment confirmed?: Yes Date of Specialist follow-up appointment?: 03/04/23 Follow-Up Specialty Provider:: Dr. Eden Emms Do you need transportation to your follow-up appointment?: No Do you understand care options if your condition(s) worsen?: Yes-patient verbalized understanding  SDOH Interventions Today    Flowsheet Row Most Recent Value  SDOH Interventions   Food Insecurity Interventions Intervention Not Indicated  Housing Interventions Intervention Not Indicated  Transportation Interventions Intervention Not Indicated  Utilities Interventions Intervention Not Indicated      TOC Interventions Today    Flowsheet Row Most Recent Value  TOC Interventions   TOC Interventions Discussed/Reviewed TOC Interventions Discussed, TOC Interventions Reviewed, Arranged PCP follow up less than 12 days/Care Guide scheduled      Jodelle Gross RN, BSN, CCM RN Care Manager  Transitions of Care  [facility]  [phone]

## 2023-03-06 ENCOUNTER — Telehealth: Payer: Self-pay | Admitting: Family Medicine

## 2023-03-06 ENCOUNTER — Other Ambulatory Visit: Payer: Self-pay | Admitting: Nurse Practitioner

## 2023-03-06 DIAGNOSIS — E7800 Pure hypercholesterolemia, unspecified: Secondary | ICD-10-CM

## 2023-03-06 MED ORDER — FENOFIBRATE 48 MG PO TABS
48.0000 mg | ORAL_TABLET | Freq: Every day | ORAL | 0 refills | Status: DC
Start: 2023-03-06 — End: 2023-08-12

## 2023-03-06 NOTE — Telephone Encounter (Signed)
Spoke with representative that called aware and verbalized understanding they will let pt know

## 2023-03-06 NOTE — Progress Notes (Signed)
Client is having muscles cramps with Lipitor and Atorvastatin will change her to tricor

## 2023-03-10 ENCOUNTER — Encounter: Payer: Self-pay | Admitting: Pharmacist

## 2023-03-10 NOTE — Progress Notes (Signed)
Pharmacy Quality Measure Review  This patient is appearing on a report for being at risk of failing the adherence measure for cholesterol (statin) medications this calendar year.   Medication: rosuvastatin Last fill date: 9/9 for 30 day supply  No longer taking rosuvastatin d/t muscle cramps. She has been switched to Tricor. Insurance report was not up to date.  Jarrett Ables, PharmD PGY-1 Pharmacy Resident

## 2023-03-12 ENCOUNTER — Encounter: Payer: Self-pay | Admitting: Family

## 2023-03-12 ENCOUNTER — Ambulatory Visit (INDEPENDENT_AMBULATORY_CARE_PROVIDER_SITE_OTHER): Payer: 59 | Admitting: Family

## 2023-03-12 VITALS — BP 131/63 | HR 75 | Temp 97.8°F | Ht 61.0 in | Wt 161.8 lb

## 2023-03-12 DIAGNOSIS — Z09 Encounter for follow-up examination after completed treatment for conditions other than malignant neoplasm: Secondary | ICD-10-CM | POA: Diagnosis not present

## 2023-03-12 DIAGNOSIS — K5792 Diverticulitis of intestine, part unspecified, without perforation or abscess without bleeding: Secondary | ICD-10-CM | POA: Diagnosis not present

## 2023-03-12 DIAGNOSIS — R110 Nausea: Secondary | ICD-10-CM | POA: Diagnosis not present

## 2023-03-12 MED ORDER — ONDANSETRON HCL 4 MG PO TABS: 4.0000 mg | ORAL_TABLET | Freq: Three times a day (TID) | ORAL | 0 refills | Status: DC | PRN

## 2023-03-12 NOTE — Patient Instructions (Signed)
Diverticulitis  Diverticulitis happens when poop (stool) and bacteria get trapped in small pouches in the colon called diverticula. These pouches may form if you have a condition called diverticulosis. When the poop and bacteria get trapped, it can cause an infection and inflammation. Diverticulitis may cause severe stomach pain and diarrhea. It can also lead to tissue damage in your colon. This can cause bleeding or blockage. In some cases, the diverticula may burst (rupture). This can cause infected poop to go into other parts of your abdomen. What are the causes? This condition is caused by poop getting trapped in the diverticula. This allows bacteria to grow. It can lead to inflammation and infection. What increases the risk? You are more likely to get this condition if you have diverticulosis. You are also more at risk if: You are overweight or obese. You do not get enough exercise. You drink alcohol. You smoke. You eat a lot of red meat, such as beef, pork, or lamb. You do not get enough fiber. Foods high in fiber include fruits, vegetables, beans, nuts, and whole grains. You are over 35 years of age. What are the signs or symptoms? Symptoms of this condition may include: Pain and tenderness in the abdomen. This pain is often felt on the left side but may occur in other spots. Fever and chills. Nausea and vomiting. Cramping. Bloating. Changes in how often you poop. Blood in your poop. How is this diagnosed? This condition is diagnosed based on your medical history and a physical exam. You may also have tests done to make sure there is nothing else causing your condition. These tests may include: Blood tests. Tests done on your pee (urine). A CT scan of the abdomen. You may need to have a colonoscopy. This is an exam to look at your whole large intestine. During the exam, a tube is put into the opening of your butt (anus) and then moved into your rectum, colon, and other parts of  the large intestine. This exam is done to look at the diverticula. It can also see if there is something else that may be causing your symptoms. How is this treated? Most cases are mild and can be treated at home. You may be told to: Take over-the-counter pain medicine. Only eat and drink clear liquids. Take antibiotics. Rest. More severe cases may need to be treated at a hospital. Treatment may include: Not eating or drinking. Taking pain medicines. Getting antibiotics through an IV. Getting fluids and nutrition through an IV. Surgery. Follow these instructions at home: Medicines Take over-the-counter and prescription medicines only as told by your health care provider. These include fiber supplements, probiotics, and medicines to soften your poop (stool softeners). If you were prescribed antibiotics, take them as told by your provider. Do not stop using the antibiotic even if you start to feel better. Ask your provider if the medicine prescribed to you requires you to avoid driving or using machinery. Eating and drinking  Follow the diet told by your provider. You may need to only eat and drink liquids. After your symptoms get better, you may be able to return to a more normal diet. You may be told to eat at least 25 grams (25 g) of fiber each day. Fiber makes it easier to poop. Healthy sources of fiber include: Berries. One cup has 4-8 g of fiber. Beans or lentils. One-half cup has 5-8 g of fiber. Green vegetables. One cup has 4 g of fiber. Avoid eating red meat.  General instructions Do not use any products that contain nicotine or tobacco. These products include cigarettes, chewing tobacco, and vaping devices, such as e-cigarettes. If you need help quitting, ask your provider. Exercise for at least 30 minutes, 3 times a week. Exercise hard enough to raise your heart rate and break a sweat. Contact a health care provider if: Your pain gets worse. Your pooping does not go back to  normal. Your symptoms do not get better with treatment. Your symptoms get worse all of a sudden. You have a fever. You vomit more than one time. Your poop is bloody, black, or tarry. This information is not intended to replace advice given to you by your health care provider. Make sure you discuss any questions you have with your health care provider. Document Revised: 01/25/2022 Document Reviewed: 01/25/2022 Elsevier Patient Education  2024 [employer].

## 2023-03-12 NOTE — Progress Notes (Signed)
Subjective:    Patient ID: [name], female    DOB: 01/13/1936, 87 y.o.   MRN: [id number]  No chief complaint on file.  Today's visit was for Transitional Care Management.  The patient was discharged from [facility] on 03/03/23 with a primary diagnosis of acute diverticulitis.   Contact with the patient and/or caregiver, by a clinical staff member, was made on 03/05/23 and was documented as a telephone encounter within the EMR.  Through chart review and discussion with the patient I have determined that management of their condition is of low complexity.   Pt presented to ED on 02/27/23 with recurrent abdominal pain. "Patient was initially seen in the ED on 02/03/2023 for LLQ abdominal pain.  CT of the abdomen and pelvis on the day showed mild sigmoid diverticulitis without perforation.  The patient was discharged home in stable condition with Augmentin.  She followed up with her PCP on 02/12/2023.  The patient continued to have abdominal pain at that time with some hematochezia.  The patient was given a course of ciprofloxacin and metronidazole which she has finished.  She stated that she had some improvement in her left lower quadrant abdominal pain, but has developed right lower quadrant abdominal pain.  She continues to have some loose stool that she describes as pinkish.  Has had some subjective fevers and chills.  She has some nausea without emesis.  She denies any frank chest pain, shortness breath, hemoptysis.  She describes some fluttering in her chest occasionally. In the ED, the patient was afebrile hemodynamically stable with oxygen saturation 95% on room air.  WBC 8.6, hemoglobin 12.0, platelets 314.  Sodium 136, potassium 3.4, bicarbonate 24, serum creatinine 0.64.  LFTs unremarkable.  Lipase 29.  CT of the abdomen and pelvis showed mild stranding about a diverticulum in the distal descending colon consistent with diverticulitis.  There is no perforation nor abscess.  Patient  started on zosyn"  Her IV Zosyn was transitioned to oral Augmentin for 13 days. She is to have colonoscopy in 8 weeks and has follow up with GI 04/02/23. Abdominal Pain This is a recurrent problem. The current episode started more than 1 month ago. The problem occurs intermittently. The problem has been gradually improving. The pain is located in the RLQ and LLQ. The pain is at a severity of 3/10. The pain is mild. The quality of the pain is dull. Associated symptoms include diarrhea and nausea. Pertinent negatives include no belching, constipation or vomiting. Treatments tried: augmentin. The treatment provided mild relief.      Review of Systems  Gastrointestinal:  Positive for abdominal pain, diarrhea and nausea. Negative for constipation and vomiting.  All other systems reviewed and are negative.  Family History  Problem Relation Age of Onset   Heart disease Mother    Aneurysm Father    Lung cancer Brother    Heart disease Sister    Diabetes Brother    Heart disease Brother    Social History   Socioeconomic History   Marital status: Widowed    Spouse name: Not on file   Number of children: Not on file   Years of education: 9th   Highest education level: Not on file  Occupational History    Employer: RETIRED  Tobacco Use   Smoking status: Former    Current packs/day: 0.00    Average packs/day: 0.3 packs/day for 31.0 years (7.8 ttl pk-yrs)    Types: Cigarettes    Start date: 05/14/1977  Quit date: 05/28/2008    Years since quitting: 14.7   Smokeless tobacco: Never   Tobacco comments:    patient states she only smoked for 3 years total  Vaping Use   Vaping status: Never Used  Substance and Sexual Activity   Alcohol use: No    Alcohol/week: 0.0 standard drinks of alcohol   Drug use: No   Sexual activity: Not Currently    Birth control/protection: Surgical    Comment: hyst  Other Topics Concern   Not on file  Social History Narrative   Not on file   Social  Determinants of Health   Financial Resource Strain: Low Risk  (04/20/2022)   Overall Financial Resource Strain (CARDIA)    Difficulty of Paying Living Expenses: Not hard at all  Food Insecurity: No Food Insecurity (03/05/2023)   Hunger Vital Sign    Worried About Running Out of Food in the Last Year: Never true    Ran Out of Food in the Last Year: Never true  Transportation Needs: No Transportation Needs (03/05/2023)   PRAPARE - Administrator, Civil Service (Medical): No    Lack of Transportation (Non-Medical): No  Physical Activity: Insufficiently Active (04/20/2022)   Exercise Vital Sign    Days of Exercise per Week: 2 days    Minutes of Exercise per Session: 20 min  Stress: No Stress Concern Present (04/20/2022)   [employer] of Occupational Health - Occupational Stress Questionnaire    Feeling of Stress : Not at all  Social Connections: Moderately Isolated (04/20/2022)   Social Connection and Isolation Panel [NHANES]    Frequency of Communication with Friends and Family: More than three times a week    Frequency of Social Gatherings with Friends and Family: More than three times a week    Attends Religious Services: 1 to 4 times per year    Active Member of [employer] or Organizations: No    Attends Banker Meetings: Never    Marital Status: Widowed       Objective:   Physical Exam Vitals reviewed.  Constitutional:      General: She is not in acute distress.    Appearance: She is well-developed.  HENT:     Head: Normocephalic and atraumatic.  Eyes:     Pupils: Pupils are equal, round, and reactive to light.  Neck:     Thyroid: No thyromegaly.  Cardiovascular:     Rate and Rhythm: Normal rate and regular rhythm.     Heart sounds: Normal heart sounds. No murmur heard. Pulmonary:     Effort: Pulmonary effort is normal. No respiratory distress.     Breath sounds: Normal breath sounds. No wheezing.  Abdominal:     General: Bowel sounds are  normal. There is no distension.     Palpations: Abdomen is soft.     Tenderness: There is abdominal tenderness (RLQ).  Musculoskeletal:        General: No tenderness. Normal range of motion.     Cervical back: Normal range of motion and neck supple.  Skin:    General: Skin is warm and dry.  Neurological:     Mental Status: She is alert and oriented to person, place, and time.     Cranial Nerves: No cranial nerve deficit.     Deep Tendon Reflexes: Reflexes are normal and symmetric.  Psychiatric:        Behavior: Behavior normal.        Thought Content: Thought content  normal.        Judgment: Judgment normal.     BP 131/63   Pulse 75   Temp 97.8 F (36.6 C) (Temporal)   Ht 5\' 1"  (1.549 m)   Wt 161 lb 12.8 oz (73.4 kg)   SpO2 98%   BMI 30.57 kg/m        Assessment & Plan:  Cayci Wengler Justin comes in today with chief complaint of Hospitalization Follow-up   Diagnosis and orders addressed:  1. Diverticulitis - ondansetron (ZOFRAN) 4 MG tablet; Take 1 tablet (4 mg total) by mouth every 8 (eight) hours as needed for nausea or vomiting.  Dispense: 20 tablet; Refill: 0 - CMP14+EGFR - CBC with Differential/Platelet  2. Hospital discharge follow-up - CMP14+EGFR - CBC with Differential/Platelet  3. Nausea - ondansetron (ZOFRAN) 4 MG tablet; Take 1 tablet (4 mg total) by mouth every 8 (eight) hours as needed for nausea or vomiting.  Dispense: 20 tablet; Refill: 0 - CMP14+EGFR - CBC with Differential/Platelet   Labs pending Continue Augmentin  Zofran as needed Keep referral to GI and PCP Follow up if symptoms worsen or do not improve     Jannifer Rodney, FNP

## 2023-03-13 ENCOUNTER — Telehealth: Payer: Self-pay | Admitting: Cardiovascular Disease

## 2023-03-13 ENCOUNTER — Telehealth: Payer: Self-pay

## 2023-03-13 LAB — CBC WITH DIFFERENTIAL/PLATELET
Basophils Absolute: 0 10*3/uL (ref 0.0–0.2)
Basos: 0 %
EOS (ABSOLUTE): 0.4 10*3/uL (ref 0.0–0.4)
Eos: 5 %
Hematocrit: 40.7 % (ref 34.0–46.6)
Hemoglobin: 12.5 g/dL (ref 11.1–15.9)
Immature Grans (Abs): 0 10*3/uL (ref 0.0–0.1)
Immature Granulocytes: 0 %
Lymphocytes Absolute: 2.7 10*3/uL (ref 0.7–3.1)
Lymphs: 35 %
MCH: 26.2 pg — ABNORMAL LOW (ref 26.6–33.0)
MCHC: 30.7 g/dL — ABNORMAL LOW (ref 31.5–35.7)
MCV: 85 fL (ref 79–97)
Monocytes Absolute: 0.9 10*3/uL (ref 0.1–0.9)
Monocytes: 11 %
Neutrophils Absolute: 3.7 10*3/uL (ref 1.4–7.0)
Neutrophils: 49 %
Platelets: 337 10*3/uL (ref 150–450)
RBC: 4.78 x10E6/uL (ref 3.77–5.28)
RDW: 14 % (ref 11.7–15.4)
WBC: 7.6 10*3/uL (ref 3.4–10.8)

## 2023-03-13 LAB — CMP14+EGFR
ALT: 7 [IU]/L (ref 0–32)
AST: 14 [IU]/L (ref 0–40)
Albumin: 4.4 g/dL (ref 3.7–4.7)
Alkaline Phosphatase: 49 [IU]/L (ref 44–121)
BUN/Creatinine Ratio: 12 (ref 12–28)
BUN: 8 mg/dL (ref 8–27)
Bilirubin Total: 0.3 mg/dL (ref 0.0–1.2)
CO2: 28 mmol/L (ref 20–29)
Calcium: 10.8 mg/dL — ABNORMAL HIGH (ref 8.7–10.3)
Chloride: 101 mmol/L (ref 96–106)
Creatinine, Ser: 0.65 mg/dL (ref 0.57–1.00)
Globulin, Total: 2.4 g/dL (ref 1.5–4.5)
Glucose: 99 mg/dL (ref 70–99)
Potassium: 4.7 mmol/L (ref 3.5–5.2)
Sodium: 142 mmol/L (ref 134–144)
Total Protein: 6.8 g/dL (ref 6.0–8.5)
eGFR: 86 mL/min/{1.73_m2} (ref >59)

## 2023-03-13 MED ORDER — ISOSORBIDE MONONITRATE ER 30 MG PO TB24: 30.0000 mg | ORAL_TABLET | Freq: Every day | ORAL | Status: DC

## 2023-03-13 NOTE — Telephone Encounter (Signed)
Pt c/o medication issue:  1. Name of Medication:  Isosorbide Mononitrate 30 MG tablet  2. How are you currently taking this medication (dosage and times per day)?   3. Are you having a reaction (difficulty breathing--STAT)?   4. What is your medication issue?   Patient would like to clarify instructions. She would like to know if she should be taking a half or whole tablet.

## 2023-03-13 NOTE — Patient Outreach (Signed)
Care Management  Transitions of Care Program Transitions of Care Post-discharge week 2   03/13/2023 Name: [name] MRN: [id number] DOB: 1936-04-04  Subjective: [name] is a 87 y.o. year old female who is a primary care patient of Stacks, Broadus John, MD. The Care Management team Engaged with patient Engaged with patient by telephone to assess and address transitions of care needs.   Consent to Services:  Patient was given information about care management services, agreed to services, and gave verbal consent to participate.   Assessment: Patient notes she still has pain in her right lower abdomen.  She was seen in the clinic yesterday and remains on Augmentin.  She is taking her medications as ordered.  Patient is eating soft foods such as soups, baked potatoes and shakes.  She is afraid to eat regular foods. Discussed the sodium in canned soups.  Patient agrees to follow up next week.   SDOH Interventions    Flowsheet Row Telephone from 03/13/2023 in [facility] Office Visit from 03/12/2023 in [facility] Telephone from 03/05/2023 in [facility] Care Coordination Office Visit from 10/22/2022 in [facility] Office Visit from 06/18/2022 in [facility] Clinical Support from 04/20/2022 in [facility]  SDOH Interventions        Food Insecurity Interventions Intervention Not Indicated -- Intervention Not Indicated -- -- Intervention Not Indicated  Housing Interventions Intervention Not Indicated -- Intervention Not Indicated -- -- Intervention Not Indicated  Transportation Interventions Intervention Not Indicated -- Intervention Not Indicated -- -- Intervention Not Indicated  Utilities Interventions -- -- Intervention Not Indicated -- -- --  Depression Interventions/Treatment  -- Currently on  Treatment -- Currently on Treatment Currently on Treatment --  Financial Strain Interventions -- -- -- -- -- Intervention Not Indicated  Physical Activity Interventions -- -- -- -- -- Intervention Not Indicated  Stress Interventions -- -- -- -- -- Intervention Not Indicated  Social Connections Interventions -- -- -- -- -- Intervention Not Indicated        Goals Addressed             This Visit's Progress    30 day TOC       Current Barriers:  Knowledge Deficits related to plan of care for management of Diverticulitis   RNCM Clinical Goal(s):  Patient will work with the Care Management team over the next 30 days to address Transition of Care Barriers: Medication Management Support at home Provider appointments through collaboration with RN Care manager, provider, and care team.   Interventions: Evaluation of current treatment plan related to  self management and patient's adherence to plan as established by provider  Transitions of Care:  New goal. Post discharge activity limitations prescribed by provider reviewed  Patient Goals/Self-Care Activities: Participate in Transition of Care Program/Attend TOC scheduled calls Take all medications as prescribed Attend all scheduled provider appointments Call pharmacy for medication refills 3-7 days in advance of running out of medications  Follow Up Plan: Telephone visit scheduled for 03/20/23@1 :00PM        Plan: The patient has been provided with contact information for the care management team and has been advised to call with any health related questions or concerns.   Jodelle Gross RN, BSN, CCM RN Care Manager  Transitions of Care  [facility]  [phone]

## 2023-03-13 NOTE — Patient Instructions (Signed)
Visit Information  Thank you for taking time to visit with me today. Please don't hesitate to contact me if I can be of assistance to you.   Following are the goals we discussed today:   Goals Addressed             This Visit's Progress    30 day TOC       Current Barriers:  Knowledge Deficits related to plan of care for management of Diverticulitis   RNCM Clinical Goal(s):  Patient will work with the Care Management team over the next 30 days to address Transition of Care Barriers: Medication Management Support at home Provider appointments through collaboration with RN Care manager, provider, and care team.   Interventions: Evaluation of current treatment plan related to  self management and patient's adherence to plan as established by provider  Transitions of Care:  New goal. Post discharge activity limitations prescribed by provider reviewed  Patient Goals/Self-Care Activities: Participate in Transition of Care Program/Attend [facility] scheduled calls Take all medications as prescribed Attend all scheduled provider appointments Call pharmacy for medication refills 3-7 days in advance of running out of medications  Follow Up Plan: Telephone visit scheduled for 03/20/23@1 :00PM        Our next appointment is by telephone on 03/20/23 at 1PM.  Please call the care guide team at [phone] if you need to cancel or reschedule your appointment.   If you are experiencing a Mental Health or Behavioral Health Crisis or need someone to talk to, please call the Suicide and Crisis Lifeline: 988 call the [place] National Suicide Prevention Lifeline: 310 749 3826 or TTY: 706-070-4981 TTY 930 742 7066) to talk to a trained counselor   The patient verbalized understanding of instructions, educational materials, and care plan provided today and DECLINED offer to receive copy of patient instructions, educational materials, and care plan.   The patient has been provided with contact  information for the care management team and has been advised to call with any health related questions or concerns.   Jodelle Gross RN, BSN, CCM RN Care Manager  Transitions of Care  [facility]  [phone]

## 2023-03-13 NOTE — Telephone Encounter (Signed)
Per 11/13/22 B.Strader,PA-C office note, patient should be taking Imdur 30 mg daily   Was seen in hospital in October for diverticulitis and discharge summary of 03/04/23 says continue Imdur.   Patient states she refilled medication yesterday and it said take 30 mg daily,she had an old bottle at home that said take a half tablet. I clarified with her that she should take the whole tablet (30 mg)

## 2023-03-20 ENCOUNTER — Telehealth: Payer: Self-pay

## 2023-03-20 NOTE — Patient Outreach (Signed)
  Care Management  Transitions of Care Program Transitions of Care Post-discharge week 3   03/20/2023 Name: [name] MRN: [id number] DOB: 1936-04-07  Subjective: [name] is a 87 y.o. year old female who is a primary care patient of Stacks, Broadus John, MD. The Care Management team Engaged with patient Engaged with patient by telephone to assess and address transitions of care needs.   Consent to Services:  Patient was given information about care management services, agreed to services, and gave verbal consent to participate.   Assessment: Follow up call made to patient.  She shared that she completed her antibiotics and is still in pain. This Clinical research associate called the [facility] GI practice to see if patient can be seen sooner than her 04/02/23 appointment.  Had to leave a message as it was the only option.  Was waiting for return call and realized patient was made an appointment for tomorrow.  Contacted patient and she said she called them as well.  Patient agreed to follow up next week after her appointment with GI.   SDOH Interventions    Flowsheet Row Telephone from 03/13/2023 in [facility] Office Visit from 03/12/2023 in [facility] Telephone from 03/05/2023 in [facility] Care Coordination Office Visit from 10/22/2022 in [facility] Office Visit from 06/18/2022 in [facility] Clinical Support from 04/20/2022 in [facility]  SDOH Interventions        Food Insecurity Interventions Intervention Not Indicated -- Intervention Not Indicated -- -- Intervention Not Indicated  Housing Interventions Intervention Not Indicated -- Intervention Not Indicated -- -- Intervention Not Indicated  Transportation Interventions Intervention Not Indicated -- Intervention Not Indicated -- -- Intervention Not  Indicated  Utilities Interventions -- -- Intervention Not Indicated -- -- --  Depression Interventions/Treatment  -- Currently on Treatment -- Currently on Treatment Currently on Treatment --  Financial Strain Interventions -- -- -- -- -- Intervention Not Indicated  Physical Activity Interventions -- -- -- -- -- Intervention Not Indicated  Stress Interventions -- -- -- -- -- Intervention Not Indicated  Social Connections Interventions -- -- -- -- -- Intervention Not Indicated        Goals Addressed             This Visit's Progress    30 day TOC       Current Barriers:  Knowledge Deficits related to plan of care for management of Diverticulitis   RNCM Clinical Goal(s):  Patient will work with the Care Management team over the next 30 days to address Transition of Care Barriers: Medication Management Support at home Provider appointments through collaboration with RN Care manager, provider, and care team.   Interventions: Evaluation of current treatment plan related to  self management and patient's adherence to plan as established by provider  Transitions of Care:  New goal. Post discharge activity limitations prescribed by provider reviewed  Patient Goals/Self-Care Activities: Participate in Transition of Care Program/Attend TOC scheduled calls Take all medications as prescribed Attend all scheduled provider appointments Call pharmacy for medication refills 3-7 days in advance of running out of medications  Follow Up Plan: Telephone visit scheduled for 03/24/2023@10 :00PM      Jodelle Gross RN, BSN, CCM RN Care Manager  Transitions of Care  [facility]  [phone]

## 2023-03-21 ENCOUNTER — Ambulatory Visit (INDEPENDENT_AMBULATORY_CARE_PROVIDER_SITE_OTHER): Payer: 59 | Admitting: Gastroenterology

## 2023-03-21 ENCOUNTER — Encounter (INDEPENDENT_AMBULATORY_CARE_PROVIDER_SITE_OTHER): Payer: Self-pay | Admitting: Gastroenterology

## 2023-03-21 ENCOUNTER — Encounter (INDEPENDENT_AMBULATORY_CARE_PROVIDER_SITE_OTHER): Payer: Self-pay | Admitting: *Deleted

## 2023-03-21 ENCOUNTER — Telehealth (INDEPENDENT_AMBULATORY_CARE_PROVIDER_SITE_OTHER): Payer: Self-pay | Admitting: *Deleted

## 2023-03-21 VITALS — BP 125/64 | HR 84 | Temp 97.3°F | Ht 61.0 in | Wt 164.1 lb

## 2023-03-21 DIAGNOSIS — K5792 Diverticulitis of intestine, part unspecified, without perforation or abscess without bleeding: Secondary | ICD-10-CM

## 2023-03-21 MED ORDER — CLENPIQ 10-3.5-12 MG-GM -GM/175ML PO SOLN: 1.0000 | ORAL | 0 refills | Status: DC

## 2023-03-21 NOTE — Patient Instructions (Signed)
Schedule colonoscopy in first week of December Continue Bentyl as needed for abdominal pain If presenting constipation, can start taking a stool softener on a regular basis.

## 2023-03-21 NOTE — Telephone Encounter (Signed)
Called pt and made aware of her pre-op appt details. She voiced understanding and I had her read the appointment back to me.

## 2023-03-21 NOTE — H&P (View-Only) (Signed)
 Katrinka Blazing, M.D. Gastroenterology & Hepatology [facility] [address] [place], [place] [address]  Primary Care Physician: Mechele Claude, MD [address] [place] [address]  I will communicate my assessment and recommendations to the referring MD via EMR.  Problems: Diverticulitis  History of Present Illness: [name] is a 87 y.o. female with history of anxiety, coronary artery disease disease status post stent placement, COPD, CHF, hypertension, hyperlipidemia,who presents for follow up after recent episode of diverticulitis.  The patient was last seen on during her recent hospitalization in mid October 2024.  At that time she was found to have uncomplicated diverticulitis for which she was started on IV antibiotics.  She had previously failed a course of oral outpatient antibiotics.  She was advised to continue oral antibiotics (Augmentin) for a total of 2 weeks and was advised to be discharged on dicyclomine as needed for abdominal pain episodes.  Also reported to have some melena issues with mild anemia for which she was started on pantoprazole 40 mg every day.  Patient reports that she is still having pain in the right side of her abdomen, but this is better compared to prior as she was having severe pain for being hospitalized. She reports that she feels the pain constant. She reports that she is having a 3-4/10 in severity. She has tried dicyclomine which helps for the abdominal pain. She has had some bloating recently. She is having a bowel movement every day. States her stool color has normalized.  She finished her antibiotics yesterday.  Most recent hemoglobin was 12.5 on 03/12/23.  The patient denies having any nausea, vomiting, fever, chills, hematochezia, melena, hematemesis, diarrhea, jaundice, pruritus or weight loss.  Last Colonoscopy: 09/22/2014 Prep excellent.  incomplete exam the distal transverse colon  secondary to angulated loop and sigmoid colon which could never be reduced. Scattered diverticula at sigmoid colon. Normal rectal mucosa. Small hemorrhoids below the dentate line.  Past Medical History: Past Medical History:  Diagnosis Date   Acute on chronic respiratory failure with hypoxia (HCC) 04/24/2018   Anxiety    Atypical chest pain    Bronchospasm    CAD (coronary artery disease)    a. s/p DES to mid-LAD and DES to mid-OM1 in 11/2016   Cervical disc disorder with myelopathy, unspecified cervical region    Cervicalgia 01/19/2009   Qualifier: Diagnosis of  By: Romeo Apple MD, Stanley     Chest pain at rest 01/27/2015   Chronic systolic (congestive) heart failure (HCC)    COPD (chronic obstructive pulmonary disease) (HCC)    Coronary artery disease    De Quervain's disease (tenosynovitis) 04/02/2011   Disc disease with myelopathy, cervical    Essential hypertension    Hemorrhoids    Liver mass    Lung, cysts, congenital    Left lung cyst   Myocardial infarction (HCC)    Nephrolithiasis    Embedded   Nonischemic cardiomyopathy (HCC)    LVEF 35-40% 2015   On home O2    Osteoarthritis    Sprain of wrist 08/07/2012   Thoracic ascending aortic aneurysm (HCC)    4.3 cm April 2016    Past Surgical History: Past Surgical History:  Procedure Laterality Date   Benign breast tumors     CHOLECYSTECTOMY     COLONOSCOPY     COLONOSCOPY N/A 09/22/2014   Procedure: COLONOSCOPY;  Surgeon: Malissa Hippo, MD;  [facility];  Service: Endoscopy;  Laterality: N/A;  830 -- to be done in OR under fluoro   Complete hysterectomy     CORONARY STENT INTERVENTION N/A 11/23/2016   Procedure: Coronary Stent Intervention;  Surgeon: Swaziland, Peter M, MD;  [facility] INVASIVE CV LAB;  Service: Cardiovascular;  Laterality: N/A;   LEFT HEART CATH AND CORONARY ANGIOGRAPHY N/A 11/23/2016   Procedure: Left Heart Cath and Coronary Angiography;  Surgeon: Swaziland, Peter M, MD;  Location: [facility]  INVASIVE CV LAB;  Service: Cardiovascular;  Laterality: N/A;   TONSILLECTOMY AND ADENOIDECTOMY      Family History: Family History  Problem Relation Age of Onset   Heart disease Mother    Aneurysm Father    Lung cancer Brother    Heart disease Sister    Diabetes Brother    Heart disease Brother     Social History: Social History   Tobacco Use  Smoking Status Former   Current packs/day: 0.00   Average packs/day: 0.3 packs/day for 31.0 years (7.8 ttl pk-yrs)   Types: Cigarettes   Start date: 05/14/1977   Quit date: 05/28/2008   Years since quitting: 14.8  Smokeless Tobacco Never  Tobacco Comments   patient states she only smoked for 3 years total   Social History   Substance and Sexual Activity  Alcohol Use No   Alcohol/week: 0.0 standard drinks of alcohol   Social History   Substance and Sexual Activity  Drug Use No    Allergies: Allergies  Allergen Reactions   Plavix [Clopidogrel Bisulfate] Itching    Severe itching   Calcium Channel Blockers Other (See Comments)    cramping   Alprazolam Nausea And Vomiting   Codeine Nausea And Vomiting   Percodan [Oxycodone-Aspirin] Nausea And Vomiting   Statins Other (See Comments)    Muscle cramps   Valium Nausea And Vomiting    Medications: Current Outpatient Medications  Medication Sig Dispense Refill   albuterol (VENTOLIN HFA) 108 (90 Base) MCG/ACT inhaler Inhale 2 puffs into the lungs every 6 (six) hours as needed for wheezing or shortness of breath. 24 g 3   alendronate (FOSAMAX) 70 MG tablet Take 1 tablet (70 mg total) by mouth every 7 (seven) days. Take with a full glass of water on an empty stomach. Do not lay down for at least 2 hours 4 tablet 11   aspirin EC 81 MG tablet Take 81 mg by mouth every morning.      carvedilol (COREG) 3.125 MG tablet Take 1 tablet (3.125 mg total) by mouth 2 (two) times daily with a meal. (Patient taking differently: Take 3.125 mg by mouth daily as needed (high bp).) 60 tablet 11    Cholecalciferol (VITAMIN D) 50 MCG (2000 UT) tablet Take 1 tablet (2,000 Units total) by mouth daily. 90 tablet 3   dicyclomine (BENTYL) 10 MG capsule Take 1 capsule (10 mg total) by mouth every 8 (eight) hours as needed (abdominal pain). 60 capsule 1   Fluticasone-Umeclidin-Vilant (TRELEGY ELLIPTA) 100-62.5-25 MCG/ACT AEPB Inhale 1 puff into the lungs daily in the afternoon. 180 each 3   ondansetron (ZOFRAN) 4 MG tablet Take 1 tablet (4 mg total) by mouth every 8 (eight) hours as needed for nausea or vomiting. 20 tablet 0   OXYGEN Inhale 2 L into the lungs at bedtime. Can use in the morning as needed     pantoprazole (PROTONIX) 40 MG tablet Take 1 tablet (40 mg total) by mouth daily. 30 tablet 1   sacubitril-valsartan (ENTRESTO) 24-26 MG TAKE 1 TABLET BY MOUTH TWICE  DAILY. 60 tablet 5   traMADol (ULTRAM) 50 MG tablet Take 1 tablet (50 mg total) by mouth every 12 (twelve) hours as needed for moderate pain. 30 tablet 5   triamterene-hydrochlorothiazide (MAXZIDE-25) 37.5-25 MG tablet Take 1 tablet by mouth daily. For blood pressure and fluid (Patient taking differently: Take 1 tablet by mouth daily as needed (blood pressure and fluid).) 90 tablet 3   fenofibrate (TRICOR) 48 MG tablet Take 1 tablet (48 mg total) by mouth daily. (Patient not taking: Reported on 03/21/2023) 90 tablet 0   isosorbide mononitrate (IMDUR) 30 MG 24 hr tablet Take 1 tablet (30 mg total) by mouth daily.     rosuvastatin (CRESTOR) 20 MG tablet Take 1 tablet (20 mg total) by mouth daily. 30 tablet 5   No current facility-administered medications for this visit.    Review of Systems: GENERAL: negative for malaise, night sweats HEENT: No changes in hearing or vision, no nose bleeds or other nasal problems. NECK: Negative for lumps, goiter, pain and significant neck swelling RESPIRATORY: Negative for cough, wheezing CARDIOVASCULAR: Negative for chest pain, leg swelling, palpitations, orthopnea GI: SEE HPI MUSCULOSKELETAL:  Negative for joint pain or swelling, back pain, and muscle pain. SKIN: Negative for lesions, rash PSYCH: Negative for sleep disturbance, mood disorder and recent psychosocial stressors. HEMATOLOGY Negative for prolonged bleeding, bruising easily, and swollen nodes. ENDOCRINE: Negative for cold or heat intolerance, polyuria, polydipsia and goiter. NEURO: negative for tremor, gait imbalance, syncope and seizures. The remainder of the review of systems is noncontributory.   Physical Exam: BP 125/64 (BP Location: Left Arm, Patient Position: Sitting, Cuff Size: Large)   Pulse 84   Temp (!) 97.3 F (36.3 C) (Temporal)   Ht 5\' 1"  (1.549 m)   Wt 164 lb 1.6 oz (74.4 kg)   BMI 31.01 kg/m  GENERAL: The patient is AO x3, in no acute distress. HEENT: Head is normocephalic and atraumatic. EOMI are intact. Mouth is well hydrated and without lesions. NECK: Supple. No masses LUNGS: Clear to auscultation. No presence of rhonchi/wheezing/rales. Adequate chest expansion HEART: RRR, normal s1 and s2. ABDOMEN: mildly tender in the lower abdomen, no guarding, no peritoneal signs, and nondistended. BS +. No masses. EXTREMITIES: Without any cyanosis, clubbing, rash, lesions or edema. NEUROLOGIC: AOx3, no focal motor deficit. SKIN: no jaundice, no rashes  Imaging/Labs: as above  I personally reviewed and interpreted the available labs, imaging and endoscopic files.  Impression and Plan: [name] is a 87 y.o. female with history of anxiety, coronary artery disease disease status post stent placement, COPD, CHF, hypertension, hyperlipidemia,who presents for follow up after recent episode of diverticulitis.  The patient reported that she has presented improvement of her symptoms while on antibiotics.  Still presenting some mild pain but not have decreased compared to prior.  She will continue using Bentyl as needed.  Part of her pain could be coming from constipation episodes, for which she can start  taking a stool softener on a daily basis to relieve some of her abdominal complaints.  Will evaluate the colon further with a colonoscopy in first week of December.  -Schedule colonoscopy in first week of December -Continue Bentyl as needed for abdominal pain -If presenting constipation, can start taking a stool softener on a regular basis.  All questions were answered.      Katrinka Blazing, MD Gastroenterology and Hepatology [facility]

## 2023-03-21 NOTE — Progress Notes (Signed)
Katrinka Blazing, M.D. Gastroenterology & Hepatology [facility]enter[facility] [address] [place], [place] [address]  Primary Care Physician: Mechele Claude, MD [address] [place] [address]  I will communicate my assessment and recommendations to the referring MD via EMR.  Problems: Diverticulitis  History of Present Illness: [name] is a 87 y.o. female with history of anxiety, coronary artery disease disease status post stent placement, COPD, CHF, hypertension, hyperlipidemia,who presents for follow up after recent episode of diverticulitis.  The patient was last seen on during her recent hospitalization in mid October 2024.  At that time she was found to have uncomplicated diverticulitis for which she was started on IV antibiotics.  She had previously failed a course of oral outpatient antibiotics.  She was advised to continue oral antibiotics (Augmentin) for a total of 2 weeks and was advised to be discharged on dicyclomine as needed for abdominal pain episodes.  Also reported to have some melena issues with mild anemia for which she was started on pantoprazole 40 mg every day.  Patient reports that she is still having pain in the right side of her abdomen, but this is better compared to prior as she was having severe pain for being hospitalized. She reports that she feels the pain constant. She reports that she is having a 3-4/10 in severity. She has tried dicyclomine which helps for the abdominal pain. She has had some bloating recently. She is having a bowel movement every day. States her stool color has normalized.  She finished her antibiotics yesterday.  Most recent hemoglobin was 12.5 on 03/12/23.  The patient denies having any nausea, vomiting, fever, chills, hematochezia, melena, hematemesis, diarrhea, jaundice, pruritus or weight loss.  Last Colonoscopy: 09/22/2014 Prep excellent.  incomplete exam the distal transverse colon  secondary to angulated loop and sigmoid colon which could never be reduced. Scattered diverticula at sigmoid colon. Normal rectal mucosa. Small hemorrhoids below the dentate line.  Past Medical History: Past Medical History:  Diagnosis Date   Acute on chronic respiratory failure with hypoxia (HCC) 04/24/2018   Anxiety    Atypical chest pain    Bronchospasm    CAD (coronary artery disease)    a. s/p DES to mid-LAD and DES to mid-OM1 in 11/2016   Cervical disc disorder with myelopathy, unspecified cervical region    Cervicalgia 01/19/2009   Qualifier: Diagnosis of  By: Romeo Apple MD, Stanley     Chest pain at rest 01/27/2015   Chronic systolic (congestive) heart failure (HCC)    COPD (chronic obstructive pulmonary disease) (HCC)    Coronary artery disease    De Quervain's disease (tenosynovitis) 04/02/2011   Disc disease with myelopathy, cervical    Essential hypertension    Hemorrhoids    Liver mass    Lung, cysts, congenital    Left lung cyst   Myocardial infarction (HCC)    Nephrolithiasis    Embedded   Nonischemic cardiomyopathy (HCC)    LVEF 35-40% 2015   On home O2    Osteoarthritis    Sprain of wrist 08/07/2012   Thoracic ascending aortic aneurysm (HCC)    4.3 cm April 2016    Past Surgical History: Past Surgical History:  Procedure Laterality Date   Benign breast tumors     CHOLECYSTECTOMY     COLONOSCOPY     COLONOSCOPY N/A 09/22/2014   Procedure: COLONOSCOPY;  Surgeon: Malissa Hippo, MD;  [facility]: [facility];  Service: Endoscopy;  Laterality: N/A;  830 -- to be done in OR under fluoro   Complete hysterectomy     CORONARY STENT INTERVENTION N/A 11/23/2016   Procedure: Coronary Stent Intervention;  Surgeon: Swaziland, Peter M, MD;  Location: [facility] INVASIVE CV LAB;  Service: Cardiovascular;  Laterality: N/A;   LEFT HEART CATH AND CORONARY ANGIOGRAPHY N/A 11/23/2016   Procedure: Left Heart Cath and Coronary Angiography;  Surgeon: Swaziland, Peter M, MD;  Location: [facility]  INVASIVE CV LAB;  Service: Cardiovascular;  Laterality: N/A;   TONSILLECTOMY AND ADENOIDECTOMY      Family History: Family History  Problem Relation Age of Onset   Heart disease Mother    Aneurysm Father    Lung cancer Brother    Heart disease Sister    Diabetes Brother    Heart disease Brother     Social History: Social History   Tobacco Use  Smoking Status Former   Current packs/day: 0.00   Average packs/day: 0.3 packs/day for 31.0 years (7.8 ttl pk-yrs)   Types: Cigarettes   Start date: 05/14/1977   Quit date: 05/28/2008   Years since quitting: 14.8  Smokeless Tobacco Never  Tobacco Comments   patient states she only smoked for 3 years total   Social History   Substance and Sexual Activity  Alcohol Use No   Alcohol/week: 0.0 standard drinks of alcohol   Social History   Substance and Sexual Activity  Drug Use No    Allergies: Allergies  Allergen Reactions   Plavix [Clopidogrel Bisulfate] Itching    Severe itching   Calcium Channel Blockers Other (See Comments)    cramping   Alprazolam Nausea And Vomiting   Codeine Nausea And Vomiting   Percodan [Oxycodone-Aspirin] Nausea And Vomiting   Statins Other (See Comments)    Muscle cramps   Valium Nausea And Vomiting    Medications: Current Outpatient Medications  Medication Sig Dispense Refill   albuterol (VENTOLIN HFA) 108 (90 Base) MCG/ACT inhaler Inhale 2 puffs into the lungs every 6 (six) hours as needed for wheezing or shortness of breath. 24 g 3   alendronate (FOSAMAX) 70 MG tablet Take 1 tablet (70 mg total) by mouth every 7 (seven) days. Take with a full glass of water on an empty stomach. Do not lay down for at least 2 hours 4 tablet 11   aspirin EC 81 MG tablet Take 81 mg by mouth every morning.      carvedilol (COREG) 3.125 MG tablet Take 1 tablet (3.125 mg total) by mouth 2 (two) times daily with a meal. (Patient taking differently: Take 3.125 mg by mouth daily as needed (high bp).) 60 tablet 11    Cholecalciferol (VITAMIN D) 50 MCG (2000 UT) tablet Take 1 tablet (2,000 Units total) by mouth daily. 90 tablet 3   dicyclomine (BENTYL) 10 MG capsule Take 1 capsule (10 mg total) by mouth every 8 (eight) hours as needed (abdominal pain). 60 capsule 1   Fluticasone-Umeclidin-Vilant (TRELEGY ELLIPTA) 100-62.5-25 MCG/ACT AEPB Inhale 1 puff into the lungs daily in the afternoon. 180 each 3   ondansetron (ZOFRAN) 4 MG tablet Take 1 tablet (4 mg total) by mouth every 8 (eight) hours as needed for nausea or vomiting. 20 tablet 0   OXYGEN Inhale 2 L into the lungs at bedtime. Can use in the morning as needed     pantoprazole (PROTONIX) 40 MG tablet Take 1 tablet (40 mg total) by mouth daily. 30 tablet 1   sacubitril-valsartan (ENTRESTO) 24-26 MG TAKE 1 TABLET BY MOUTH TWICE  DAILY. 60 tablet 5   traMADol (ULTRAM) 50 MG tablet Take 1 tablet (50 mg total) by mouth every 12 (twelve) hours as needed for moderate pain. 30 tablet 5   triamterene-hydrochlorothiazide (MAXZIDE-25) 37.5-25 MG tablet Take 1 tablet by mouth daily. For blood pressure and fluid (Patient taking differently: Take 1 tablet by mouth daily as needed (blood pressure and fluid).) 90 tablet 3   fenofibrate (TRICOR) 48 MG tablet Take 1 tablet (48 mg total) by mouth daily. (Patient not taking: Reported on 03/21/2023) 90 tablet 0   isosorbide mononitrate (IMDUR) 30 MG 24 hr tablet Take 1 tablet (30 mg total) by mouth daily.     rosuvastatin (CRESTOR) 20 MG tablet Take 1 tablet (20 mg total) by mouth daily. 30 tablet 5   No current facility-administered medications for this visit.    Review of Systems: GENERAL: negative for malaise, night sweats HEENT: No changes in hearing or vision, no nose bleeds or other nasal problems. NECK: Negative for lumps, goiter, pain and significant neck swelling RESPIRATORY: Negative for cough, wheezing CARDIOVASCULAR: Negative for chest pain, leg swelling, palpitations, orthopnea GI: SEE HPI MUSCULOSKELETAL:  Negative for joint pain or swelling, back pain, and muscle pain. SKIN: Negative for lesions, rash PSYCH: Negative for sleep disturbance, mood disorder and recent psychosocial stressors. HEMATOLOGY Negative for prolonged bleeding, bruising easily, and swollen nodes. ENDOCRINE: Negative for cold or heat intolerance, polyuria, polydipsia and goiter. NEURO: negative for tremor, gait imbalance, syncope and seizures. The remainder of the review of systems is noncontributory.   Physical Exam: BP 125/64 (BP Location: Left Arm, Patient Position: Sitting, Cuff Size: Large)   Pulse 84   Temp (!) 97.3 F (36.3 C) (Temporal)   Ht 5\' 1"  (1.549 m)   Wt 164 lb 1.6 oz (74.4 kg)   BMI 31.01 kg/m  GENERAL: The patient is AO x3, in no acute distress. HEENT: Head is normocephalic and atraumatic. EOMI are intact. Mouth is well hydrated and without lesions. NECK: Supple. No masses LUNGS: Clear to auscultation. No presence of rhonchi/wheezing/rales. Adequate chest expansion HEART: RRR, normal s1 and s2. ABDOMEN: mildly tender in the lower abdomen, no guarding, no peritoneal signs, and nondistended. BS +. No masses. EXTREMITIES: Without any cyanosis, clubbing, rash, lesions or edema. NEUROLOGIC: AOx3, no focal motor deficit. SKIN: no jaundice, no rashes  Imaging/Labs: as above  I personally reviewed and interpreted the available labs, imaging and endoscopic files.  Impression and Plan: Tylayah Parker Castille is a 87 y.o. female with history of anxiety, coronary artery disease disease status post stent placement, COPD, CHF, hypertension, hyperlipidemia,who presents for follow up after recent episode of diverticulitis.  The patient reported that she has presented improvement of her symptoms while on antibiotics.  Still presenting some mild pain but not have decreased compared to prior.  She will continue using Bentyl as needed.  Part of her pain could be coming from constipation episodes, for which she can start  taking a stool softener on a daily basis to relieve some of her abdominal complaints.  Will evaluate the colon further with a colonoscopy in first week of December.  -Schedule colonoscopy in first week of December -Continue Bentyl as needed for abdominal pain -If presenting constipation, can start taking a stool softener on a regular basis.  All questions were answered.      Katrinka Blazing, MD Gastroenterology and Hepatology [facility]astroenterology

## 2023-03-27 ENCOUNTER — Telehealth: Payer: Self-pay

## 2023-03-27 ENCOUNTER — Other Ambulatory Visit: Payer: Self-pay

## 2023-03-27 NOTE — Patient Instructions (Signed)
Visit Information  Thank you for taking time to visit with me today. Please don't hesitate to contact me if I can be of assistance to you.   Following are the goals we discussed today:   Goals Addressed             This Visit's Progress    COMPLETED: 30 day TOC       Current Barriers:  Knowledge Deficits related to plan of care for management of Diverticulitis   RNCM Clinical Goal(s):  Patient will work with the Care Management team over the next 30 days to address Transition of Care Barriers: Medication Management Support at home Provider appointments through collaboration with RN Care manager, provider, and care team.   Interventions: Evaluation of current treatment plan related to  self management and patient's adherence to plan as established by provider  Transitions of Care:  New goal. Post discharge activity limitations prescribed by provider reviewed  Patient Goals/Self-Care Activities: Participate in Transition of Care Program/Attend TOC scheduled calls Take all medications as prescribed Attend all scheduled provider appointments Call pharmacy for medication refills 3-7 days in advance of running out of medications            If you are experiencing a Mental Health or Behavioral Health Crisis or need someone to talk to, please call the Suicide and Crisis Lifeline: 988 call the [place] National Suicide Prevention Lifeline: (503)047-3797 or TTY: 480-619-7712 TTY 920-107-8824) to talk to a trained counselor   Patient verbalizes understanding of instructions and care plan provided today and agrees to view in MyChart. Active MyChart status and patient understanding of how to access instructions and care plan via MyChart confirmed with patient.     The patient has been provided with contact information for the care management team and has been advised to call with any health related questions or concerns.   Jodelle Gross RN, BSN, CCM RN Care Manager  Transitions  of Care  [facility]  [phone]

## 2023-03-27 NOTE — Patient Outreach (Signed)
  Care Management  Transitions of Care Program Transitions of Care Post-discharge week 4   03/27/2023 Name: Yamile Noia Deyo MRN: [id number] DOB: 03-02-1936  Subjective: Mayzee Huskey Stanfield is a 87 y.o. year old female who is a primary care patient of Stacks, Broadus John, MD. The Care Management team Engaged with patient Engaged with patient by telephone to assess and address transitions of care needs.   Consent to Services:  Patient was given information about care management services, agreed to services, and gave verbal consent to participate.   Assessment: Spoke with patient regarding her visit to her GI doctor and the plan.  Patient's pain in right abdomen continues but it has improved. Patient will be having colonoscopy in early December.   SDOH Interventions    Flowsheet Row Telephone from 03/13/2023 in [facility] Office Visit from 03/12/2023 in [facility] Telephone from 03/05/2023 in [facility] Office Visit from 10/22/2022 in [facility] Office Visit from 06/18/2022 in [facility] Clinical Support from 04/20/2022 in [facility]  SDOH Interventions        Food Insecurity Interventions Intervention Not Indicated -- Intervention Not Indicated -- -- Intervention Not Indicated  Housing Interventions Intervention Not Indicated -- Intervention Not Indicated -- -- Intervention Not Indicated  Transportation Interventions Intervention Not Indicated -- Intervention Not Indicated -- -- Intervention Not Indicated  Utilities Interventions -- -- Intervention Not Indicated -- -- --  Depression Interventions/Treatment  -- Currently on Treatment -- Currently on Treatment Currently on Treatment --  Financial Strain Interventions -- -- -- -- -- Intervention Not Indicated  Physical Activity  Interventions -- -- -- -- -- Intervention Not Indicated  Stress Interventions -- -- -- -- -- Intervention Not Indicated  Social Connections Interventions -- -- -- -- -- Intervention Not Indicated        Goals Addressed             This Visit's Progress    COMPLETED: 30 day TOC       Current Barriers:  Knowledge Deficits related to plan of care for management of Diverticulitis   RNCM Clinical Goal(s):  Patient will work with the Care Management team over the next 30 days to address Transition of Care Barriers: Medication Management Support at home Provider appointments through collaboration with RN Care manager, provider, and care team.   Interventions: Evaluation of current treatment plan related to  self management and patient's adherence to plan as established by provider  Transitions of Care:  New goal. Post discharge activity limitations prescribed by provider reviewed  Patient Goals/Self-Care Activities: Participate in Transition of Care Program/Attend TOC scheduled calls Take all medications as prescribed Attend all scheduled provider appointments Call pharmacy for medication refills 3-7 days in advance of running out of medications          Plan: The patient has been provided with contact information for the care management team and has been advised to call with any health related questions or concerns.   Jodelle Gross RN, BSN, CCM RN Care Manager  Transitions of Care  [facility]  [phone]

## 2023-04-02 ENCOUNTER — Ambulatory Visit: Payer: 59 | Admitting: Gastroenterology

## 2023-04-09 ENCOUNTER — Encounter (HOSPITAL_COMMUNITY): Payer: Self-pay

## 2023-04-09 ENCOUNTER — Encounter (HOSPITAL_COMMUNITY)
Admission: RE | Admit: 2023-04-09 | Discharge: 2023-04-09 | Disposition: A | Discharge status: Home / Self Care | Payer: 59 | Source: Ambulatory Visit | Attending: Gastroenterology | Admitting: Gastroenterology

## 2023-04-09 NOTE — Patient Instructions (Signed)
[name]  04/09/2023     @PREFPERIOPPHARMACY @   Your procedure is scheduled on 04/16/2023.  Report to [facility] at 9:00 A.M.  Call this number if you have problems the morning of surgery:  [phone]  If you experience any cold or flu symptoms such as cough, fever, chills, shortness of breath, etc. between now and your scheduled surgery, please notify us at the above number.   Remember:   Please Follow the diet and prep instructions given to you by Dr Karlyn Agee office.        Take these medicines the morning of surgery with A SIP OF WATER : Carvedilol Isosorbide Pantoprazole and Tramadol (if needed)    Do not wear jewelry, make-up or nail polish, including gel polish,  artificial nails, or any other type of covering on natural nails (fingers and  toes).  Do not wear lotions, powders, or perfumes, or deodorant.  Do not shave 48 hours prior to surgery.  Men may shave face and neck.  Do not bring valuables to the hospital.  [facility] is not responsible for any belongings or valuables.  Contacts, dentures or bridgework may not be worn into surgery.  Leave your suitcase in the car.  After surgery it may be brought to your room.  For patients admitted to the hospital, discharge time will be determined by your treatment team.  Patients discharged the day of surgery will not be allowed to drive home.   Name and phone number of your driver:   Family Special instructions:  N/A  Please read over the following fact sheets that you were given. Care and Recovery After Surgery  Colonoscopy, Adult A colonoscopy is a procedure to look at the entire large intestine. This procedure is done using a long, thin, flexible tube that has a camera on the end. You may have a colonoscopy: As a part of normal colorectal screening. If you have certain symptoms, such as: A low number of red blood cells in your blood (anemia). Diarrhea that does not go away. Pain in your  abdomen. Blood in your stool. A colonoscopy can help screen for and diagnose medical problems, including: An abnormal growth of cells or tissue (tumor). Abnormal growths within the lining of your intestine (polyps). Inflammation. Areas of bleeding. Tell your health care provider about: Any allergies you have. All medicines you are taking, including vitamins, herbs, eye drops, creams, and over-the-counter medicines. Any problems you or family members have had with anesthetic medicines. Any bleeding problems you have. Any surgeries you have had. Any medical conditions you have. Any problems you have had with having bowel movements. Whether you are pregnant or may be pregnant. What are the risks? Generally, this is a safe procedure. However, problems may occur, including: Bleeding. Damage to your intestine. Allergic reactions to medicines given during the procedure. Infection. This is rare. What happens before the procedure? Eating and drinking restrictions Follow instructions from your health care provider about eating or drinking restrictions, which may include: A few days before the procedure: Follow a low-fiber diet. Avoid nuts, seeds, dried fruit, raw fruits, and vegetables. 1-3 days before the procedure: Eat only gelatin dessert or ice pops. Drink only clear liquids, such as water, clear juice, clear broth or bouillon, black coffee or tea, or clear soft drinks or sports drinks. Avoid liquids that contain red or purple dye. The day of the procedure: Do not eat solid foods. You may continue to drink clear liquids until up to  2 hours before the procedure. Do not eat or drink anything starting 2 hours before the procedure, or within the time period that your health care provider recommends. Bowel prep If you were prescribed a bowel prep to take by mouth (orally) to clean out your colon: Take it as told by your health care provider. Starting the day before your procedure, you will  need to drink a large amount of liquid medicine. The liquid will cause you to have many bowel movements of loose stool until your stool becomes almost clear or light green. If your skin or the opening between the buttocks (anus) gets irritated from diarrhea, you may relieve the irritation using: Wipes with medicine in them, such as adult wet wipes with aloe and vitamin E. A product to soothe skin, such as petroleum jelly. If you vomit while drinking the bowel prep: Take a break for up to 60 minutes. Begin the bowel prep again. Call your health care provider if you keep vomiting or you cannot take the bowel prep without vomiting. To clean out your colon, you may also be given: Laxative medicines. These help you have a bowel movement. Instructions for enema use. An enema is liquid medicine injected into your rectum. Medicines Ask your health care provider about: Changing or stopping your regular medicines or supplements. This is especially important if you are taking iron supplements, diabetes medicines, or blood thinners. Taking medicines such as aspirin and ibuprofen. These medicines can thin your blood. Do not take these medicines unless your health care provider tells you to take them. Taking over-the-counter medicines, vitamins, herbs, and supplements. General instructions Ask your health care provider what steps will be taken to help prevent infection. These may include washing skin with a germ-killing soap. If you will be going home right after the procedure, plan to have a responsible adult: Take you home from the hospital or clinic. You will not be allowed to drive. Care for you for the time you are told. What happens during the procedure?  An IV will be inserted into one of your veins. You will be given a medicine to make you fall asleep (general anesthetic). You will lie on your side with your knees bent. A lubricant will be put on the tube. Then the tube will be: Inserted into  your anus. Gently eased through all parts of your large intestine. Air will be sent into your colon to keep it open. This may cause some pressure or cramping. Images will be taken with the camera and will appear on a screen. A small tissue sample may be removed to be looked at under a microscope (biopsy). The tissue may be sent to a lab for testing if any signs of problems are found. If small polyps are found, they may be removed and checked for cancer cells. When the procedure is finished, the tube will be removed. The procedure may vary among health care providers and hospitals. What happens after the procedure? Your blood pressure, heart rate, breathing rate, and blood oxygen level will be monitored until you leave the hospital or clinic. You may have a small amount of blood in your stool. You may pass gas and have mild cramping or bloating in your abdomen. This is caused by the air that was used to open your colon during the exam. If you were given a sedative during the procedure, it can affect you for several hours. Do not drive or operate machinery until your health care provider says that it  is safe. It is up to you to get the results of your procedure. Ask your health care provider, or the department that is doing the procedure, when your results will be ready. Summary A colonoscopy is a procedure to look at the entire large intestine. Follow instructions from your health care provider about eating and drinking before the procedure. If you were prescribed an oral bowel prep to clean out your colon, take it as told by your health care provider. During the colonoscopy, a flexible tube with a camera on its end is inserted into the anus and then passed into all parts of the large intestine. This information is not intended to replace advice given to you by your health care provider. Make sure you discuss any questions you have with your health care provider. Document Revised: 06/12/2022  Document Reviewed: 12/21/2020 Elsevier Patient Education  2024 [employer] Inc[employer]  Monitored Anesthesia Care Anesthesia refers to the techniques, procedures, and medicines that help a person stay safe and comfortable during surgery. Monitored anesthesia care, or sedation, is one type of anesthesia. You may have sedation if you do not need to be asleep for your procedure. Procedures that use sedation may include: Surgery to remove cataracts from your eyes. A dental procedure. A biopsy. This is when a tissue sample is removed and looked at under a microscope. You will be watched closely during your procedure. Your level of sedation or type of anesthesia may be changed to fit your needs. Tell a health care provider about: Any allergies you have. All medicines you are taking, including vitamins, herbs, eye drops, creams, and over-the-counter medicines. Any problems you or family members have had with anesthesia. Any bleeding problems you have. Any surgeries you have had. Any medical conditions or illnesses you have. This includes sleep apnea, cough, fever, or the flu. Whether you are pregnant or may be pregnant. Whether you use cigarettes, alcohol, or drugs. Any use of steroids, whether by mouth or as a cream. What are the risks? Your health care provider will talk with you about risks. These may include: Getting too much medicine (oversedation). Nausea. Allergic reactions to medicines. Trouble breathing. If this happens, a breathing tube may be used to help you breathe. It will be removed when you are awake and breathing on your own. Heart trouble. Lung trouble. Confusion that gets better with time (emergence delirium). What happens before the procedure? When to stop eating and drinking Follow instructions from your health care provider about what you may eat and drink. These may include: 8 hours before your procedure Stop eating most foods. Do not eat meat, fried foods, or fatty foods. Eat  only light foods, such as toast or crackers. All liquids are okay except energy drinks and alcohol. 6 hours before your procedure Stop eating. Drink only clear liquids, such as water, clear fruit juice, black coffee, plain tea, and sports drinks. Do not drink energy drinks or alcohol. 2 hours before your procedure Stop drinking all liquids. You may be allowed to take medicines with small sips of water. If you do not follow your health care provider's instructions, your procedure may be delayed or canceled. Medicines Ask your health care provider about: Changing or stopping your regular medicines. These include any diabetes medicines or blood thinners you take. Taking medicines such as aspirin and ibuprofen. These medicines can thin your blood. Do not take them unless your health care provider tells you to. Taking over-the-counter medicines, vitamins, herbs, and supplements. Testing You may have  an exam or testing. You may have a blood or urine sample taken. General instructions Do not use any products that contain nicotine or tobacco for at least 4 weeks before the procedure. These products include cigarettes, chewing tobacco, and vaping devices, such as e-cigarettes. If you need help quitting, ask your health care provider. If you will be going home right after the procedure, plan to have a responsible adult: Take you home from the hospital or clinic. You will not be allowed to drive. Care for you for the time you are told. What happens during the procedure?  Your blood pressure, heart rate, breathing, level of pain, and blood oxygen level will be monitored. An IV will be inserted into one of your veins. You may be given: A sedative. This helps you relax. Anesthesia. This will: Numb certain areas of your body. Make you fall asleep for surgery. You will be given medicines as needed to keep you comfortable. The more medicine you are given, the deeper your level of sedation will be.  Your level of sedation may be changed to fit your needs. There are three levels of sedation: Mild sedation. At this level, you may feel awake and relaxed. You will be able to follow directions. Moderate sedation. At this level, you will be sleepy. You may not remember the procedure. Deep sedation. At this level, you will be asleep. You will not remember the procedure. How you get the medicines will depend on your age and the procedure. They may be given as: A pill. This may be taken by mouth (orally) or inserted into the rectum. An injection. This may be into a vein or muscle. A spray through the nose. After your procedure is over, the medicine will be stopped. The procedure may vary among health care providers and hospitals. What happens after the procedure? Your blood pressure, heart rate, breathing rate, and blood oxygen level will be monitored until you leave the hospital or clinic. You may feel sleepy, clumsy, or nauseous. You may not remember what happened during or after the procedure. Sedation can affect you for several hours. Do not drive or use machinery until your health care provider says that it is safe. This information is not intended to replace advice given to you by your health care provider. Make sure you discuss any questions you have with your health care provider. Document Revised: 09/25/2021 Document Reviewed: 09/25/2021 Elsevier Patient Education  2024 [employer].

## 2023-04-16 ENCOUNTER — Ambulatory Visit (HOSPITAL_COMMUNITY): Payer: 59 | Admitting: Anesthesiology

## 2023-04-16 ENCOUNTER — Encounter (HOSPITAL_COMMUNITY)
Admission: RE | Disposition: A | Discharge status: Home / Self Care | Payer: Self-pay | Source: Home / Self Care | Attending: Gastroenterology

## 2023-04-16 ENCOUNTER — Ambulatory Visit (HOSPITAL_COMMUNITY)
Admission: RE | Admit: 2023-04-16 | Discharge: 2023-04-16 | Disposition: A | Discharge status: Home / Self Care | Payer: 59 | Attending: Gastroenterology | Admitting: Gastroenterology

## 2023-04-16 ENCOUNTER — Encounter (INDEPENDENT_AMBULATORY_CARE_PROVIDER_SITE_OTHER): Payer: Self-pay | Admitting: *Deleted

## 2023-04-16 ENCOUNTER — Encounter (HOSPITAL_COMMUNITY): Payer: Self-pay | Admitting: Gastroenterology

## 2023-04-16 DIAGNOSIS — I110 Hypertensive heart disease with heart failure: Secondary | ICD-10-CM | POA: Diagnosis not present

## 2023-04-16 DIAGNOSIS — J449 Chronic obstructive pulmonary disease, unspecified: Secondary | ICD-10-CM | POA: Insufficient documentation

## 2023-04-16 DIAGNOSIS — K5730 Diverticulosis of large intestine without perforation or abscess without bleeding: Secondary | ICD-10-CM

## 2023-04-16 DIAGNOSIS — Z87891 Personal history of nicotine dependence: Secondary | ICD-10-CM | POA: Insufficient documentation

## 2023-04-16 DIAGNOSIS — R1031 Right lower quadrant pain: Secondary | ICD-10-CM | POA: Diagnosis present

## 2023-04-16 DIAGNOSIS — I5022 Chronic systolic (congestive) heart failure: Secondary | ICD-10-CM | POA: Insufficient documentation

## 2023-04-16 DIAGNOSIS — D122 Benign neoplasm of ascending colon: Secondary | ICD-10-CM | POA: Diagnosis not present

## 2023-04-16 DIAGNOSIS — K648 Other hemorrhoids: Secondary | ICD-10-CM

## 2023-04-16 DIAGNOSIS — K5732 Diverticulitis of large intestine without perforation or abscess without bleeding: Secondary | ICD-10-CM | POA: Diagnosis not present

## 2023-04-16 DIAGNOSIS — D120 Benign neoplasm of cecum: Secondary | ICD-10-CM | POA: Diagnosis not present

## 2023-04-16 DIAGNOSIS — I25119 Atherosclerotic heart disease of native coronary artery with unspecified angina pectoris: Secondary | ICD-10-CM | POA: Diagnosis not present

## 2023-04-16 DIAGNOSIS — Z9981 Dependence on supplemental oxygen: Secondary | ICD-10-CM | POA: Insufficient documentation

## 2023-04-16 DIAGNOSIS — D123 Benign neoplasm of transverse colon: Secondary | ICD-10-CM

## 2023-04-16 HISTORY — PX: POLYPECTOMY: SHX149

## 2023-04-16 HISTORY — PX: COLONOSCOPY WITH PROPOFOL: SHX5780

## 2023-04-16 LAB — HM COLONOSCOPY

## 2023-04-16 SURGERY — COLONOSCOPY WITH PROPOFOL
Anesthesia: General

## 2023-04-16 MED ORDER — PROPOFOL 500 MG/50ML IV EMUL
INTRAVENOUS | Status: DC | PRN
  Administered 2023-04-16: 150 ug/kg/min via INTRAVENOUS

## 2023-04-16 MED ORDER — LACTATED RINGERS IV SOLN: INTRAVENOUS | Status: DC | PRN

## 2023-04-16 MED ORDER — PROPOFOL 10 MG/ML IV BOLUS
INTRAVENOUS | Status: DC | PRN
  Administered 2023-04-16 (×2): 50 mg via INTRAVENOUS

## 2023-04-16 MED ORDER — LIDOCAINE HCL 1 % IJ SOLN
INTRAMUSCULAR | Status: DC | PRN
  Administered 2023-04-16: 50 mg via INTRADERMAL

## 2023-04-16 MED ORDER — LACTATED RINGERS IV SOLN: INTRAVENOUS | Status: DC

## 2023-04-16 NOTE — Interval H&P Note (Signed)
History and Physical Interval Note:  04/16/2023 9:59 AM  [name]  has presented today for surgery, with the diagnosis of diverticulitis.  The various methods of treatment have been discussed with the patient and family. After consideration of risks, benefits and other options for treatment, the patient has consented to  Procedure(s) with comments: COLONOSCOPY WITH PROPOFOL (N/A) - 11:00am, asa 3 as a surgical intervention.  The patient's history has been reviewed, patient examined, no change in status, stable for surgery.  I have reviewed the patient's chart and labs.  Questions were answered to the patient's satisfaction.     [name]

## 2023-04-16 NOTE — Discharge Instructions (Signed)
You are being discharged to home.  Resume your previous diet.  We are waiting for your pathology results.  Your physician has indicated that a repeat colonoscopy is not recommended due to your current age (66 years or older) for screening purposes.  

## 2023-04-16 NOTE — Anesthesia Preprocedure Evaluation (Signed)
Anesthesia Evaluation  Patient identified by MRN, date of birth, ID band Patient awake    Reviewed: Allergy & Precautions, H&P , NPO status , Patient's Chart, lab work & pertinent test results, reviewed documented beta blocker date and time   Airway Mallampati: II  TM Distance: >3 FB Neck ROM: full    Dental no notable dental hx. (+) Dental Advisory Given, Teeth Intact   Pulmonary neg pulmonary ROS, COPD,  oxygen dependent, former smoker History acute respiratory failure.  Bronchospasm.   Pulmonary exam normal breath sounds clear to auscultation       Cardiovascular Exercise Tolerance: Good hypertension, + angina  + CAD, + Past MI and +CHF  Normal cardiovascular exam+ dysrhythmias  Rhythm:regular Rate:Normal  Ascending thoracic aortic aneurysm.  EF 35 - 40%.  Systolic CHF with global hypokinesis.   Neuro/Psych  PSYCHIATRIC DISORDERS Anxiety     Cervicalgia. Cervical myelopathy    GI/Hepatic negative GI ROS, Neg liver ROS,GERD  ,,  Endo/Other  negative endocrine ROS    Renal/GU negative Renal ROS  negative genitourinary   Musculoskeletal  (+) Arthritis , Osteoarthritis,    Abdominal   Peds  Hematology negative hematology ROS (+)   Anesthesia Other Findings   Reproductive/Obstetrics negative OB ROS                             Anesthesia Physical Anesthesia Plan  ASA: 4  Anesthesia Plan: General   Post-op Pain Management: Minimal or no pain anticipated   Induction: Intravenous  PONV Risk Score and Plan: Propofol infusion  Airway Management Planned: Nasal Cannula and Natural Airway  Additional Equipment: None  Intra-op Plan:   Post-operative Plan:   Informed Consent: I have reviewed the patients History and Physical, chart, labs and discussed the procedure including the risks, benefits and alternatives for the proposed anesthesia with the patient or authorized representative who  has indicated his/her understanding and acceptance.     Dental Advisory Given  Plan Discussed with: CRNA  Anesthesia Plan Comments:         Anesthesia Quick Evaluation

## 2023-04-16 NOTE — Anesthesia Postprocedure Evaluation (Signed)
Anesthesia Post Note  Patient: [name]  Procedure(s) Performed: COLONOSCOPY WITH PROPOFOL POLYPECTOMY INTESTINAL  Patient location during evaluation: Short Stay Anesthesia Type: General Level of consciousness: awake and alert Pain management: pain level controlled Vital Signs Assessment: post-procedure vital signs reviewed and stable Respiratory status: nonlabored ventilation Cardiovascular status: stable and blood pressure returned to baseline Postop Assessment: no apparent nausea or vomiting Anesthetic complications: no   No notable events documented.   Last Vitals:  Vitals:   04/16/23 0935  BP: (!) 142/65  Pulse: 87  Resp: 16  Temp: 36.8 C  SpO2: 96%    Last Pain:  Vitals:   04/16/23 1236  PainSc: 0-No pain                 Dai Apel

## 2023-04-16 NOTE — Transfer of Care (Signed)
Immediate Anesthesia Transfer of Care Note  Patient: [name]  Procedure(s) Performed: COLONOSCOPY WITH PROPOFOL POLYPECTOMY INTESTINAL  Patient Location: Short Stay  Anesthesia Type:General  Level of Consciousness: awake  Airway & Oxygen Therapy: Patient Spontanous Breathing  Post-op Assessment: Report given to RN  Post vital signs: Reviewed and stable  Last Vitals:  Vitals Value Taken Time  BP 116/65 04/16/23 1314  Temp    Pulse 88 04/16/23 1315  Resp 16 04/16/23 1315  SpO2 99 % 04/16/23 1315  Vitals shown include unfiled device data.  Last Pain:  Vitals:   04/16/23 1236  PainSc: 0-No pain         Complications: No notable events documented.

## 2023-04-16 NOTE — Op Note (Signed)
[facility] Patient Name: [name] Procedure Date: 04/16/2023 11:54 AM MRN: [id number] Date of Birth: 23-Sep-1935 Attending MD: Katrinka Blazing , , [id number] CSN: [id number] Age: 87 Admit Type: Outpatient Procedure:                Colonoscopy Indications:              Abdominal pain in the right lower quadrant,                            Follow-up of diverticulitis Providers:                Katrinka Blazing, Crystal Page, Dyann Ruddle Referring MD:              Medicines:                Monitored Anesthesia Care Complications:            No immediate complications. Estimated Blood Loss:     Estimated blood loss: none. Procedure:                Pre-Anesthesia Assessment:                           - Prior to the procedure, a History and Physical                            was performed, and patient medications, allergies                            and sensitivities were reviewed. The patient's                            tolerance of previous anesthesia was reviewed.                           - The risks and benefits of the procedure and the                            sedation options and risks were discussed with the                            patient. All questions were answered and informed                            consent was obtained.                           - ASA Grade Assessment: III - A patient with severe                            systemic disease.                           After obtaining informed consent, the colonoscope                            was passed under direct vision. Throughout the  procedure, the patient's blood pressure, pulse, and                            oxygen saturations were monitored continuously. The                            [id number]-HQ190L ([id number]) scope was introduced through                            the anus and advanced to the the cecum, identified                            by appendiceal orifice and  ileocecal valve. The                            colonoscopy was performed without difficulty. The                            patient tolerated the procedure well. The quality                            of the bowel preparation was good. Scope In: 12:41:04 PM Scope Out: 1:07:46 PM Scope Withdrawal Time: 0 hours 16 minutes 25 seconds  Total Procedure Duration: 0 hours 26 minutes 42 seconds  Findings:      The perianal and digital rectal examinations were normal.      Six sessile polyps were found in the transverse colon, ascending colon,       cecum and ileocecal valve. The polyps were 2 to 12 mm in size. These       polyps were removed with a cold snare. Resection and retrieval were       complete.      A 1 mm polyp was found in the ascending colon. The polyp was sessile.       The polyp was removed with a cold biopsy forceps. Resection and       retrieval were complete.      Scattered medium-mouthed and small-mouthed diverticula were found in the       entire colon.      Non-bleeding internal hemorrhoids were found during retroflexion. The       hemorrhoids were small. Impression:               - Six 2 to 12 mm polyps in the transverse colon, in                            the ascending colon, in the cecum and at the                            ileocecal valve, removed with a cold snare.                            Resected and retrieved.                           - One 1 mm polyp in the ascending colon, removed  with a cold biopsy forceps. Resected and retrieved.                           - Diverticulosis in the entire examined colon.                           - Non-bleeding internal hemorrhoids. Moderate Sedation:      Per Anesthesia Care Recommendation:           - Discharge patient to home (ambulatory).                           - Resume previous diet.                           - Await pathology results.                           - Repeat colonoscopy is  not recommended due to                            current age (65 years or older) for screening                            purposes. Procedure Code(s):        --- Professional ---                           [id number], Colonoscopy, flexible; with removal of                            tumor(s), polyp(s), or other lesion(s) by snare                            technique                           45380, 59, Colonoscopy, flexible; with biopsy,                            single or multiple Diagnosis Code(s):        --- Professional ---                           D12.3, Benign neoplasm of transverse colon (hepatic                            flexure or splenic flexure)                           D12.2, Benign neoplasm of ascending colon                           D12.0, Benign neoplasm of cecum                           K64.8, Other hemorrhoids  R10.31, Right lower quadrant pain                           K57.32, Diverticulitis of large intestine without                            perforation or abscess without bleeding                           K57.30, Diverticulosis of large intestine without                            perforation or abscess without bleeding CPT copyright 2022 [employer]. All rights reserved. The codes documented in this report are preliminary and upon coder review may  be revised to meet current compliance requirements. Katrinka Blazing, MD Katrinka Blazing,  04/16/2023 1:14:39 PM This report has been signed electronically. Number of Addenda: 0

## 2023-04-17 LAB — SURGICAL PATHOLOGY

## 2023-04-22 ENCOUNTER — Ambulatory Visit (INDEPENDENT_AMBULATORY_CARE_PROVIDER_SITE_OTHER): Payer: 59 | Admitting: Family Medicine

## 2023-04-22 ENCOUNTER — Encounter: Payer: Self-pay | Admitting: Family Medicine

## 2023-04-22 VITALS — BP 138/61 | HR 91 | Temp 97.2°F | Ht 61.0 in | Wt 159.6 lb

## 2023-04-22 DIAGNOSIS — J432 Centrilobular emphysema: Secondary | ICD-10-CM | POA: Diagnosis not present

## 2023-04-22 DIAGNOSIS — K5792 Diverticulitis of intestine, part unspecified, without perforation or abscess without bleeding: Secondary | ICD-10-CM

## 2023-04-22 DIAGNOSIS — I10 Essential (primary) hypertension: Secondary | ICD-10-CM | POA: Diagnosis not present

## 2023-04-22 DIAGNOSIS — E782 Mixed hyperlipidemia: Secondary | ICD-10-CM | POA: Diagnosis not present

## 2023-04-22 DIAGNOSIS — R7303 Prediabetes: Secondary | ICD-10-CM | POA: Diagnosis not present

## 2023-04-22 DIAGNOSIS — I5042 Chronic combined systolic (congestive) and diastolic (congestive) heart failure: Secondary | ICD-10-CM

## 2023-04-22 LAB — LIPID PANEL

## 2023-04-22 LAB — BAYER DCA HB A1C WAIVED: HB A1C (BAYER DCA - WAIVED): 5.7 % — ABNORMAL HIGH (ref 4.8–5.6)

## 2023-04-22 MED ORDER — LEVOFLOXACIN 500 MG PO TABS: 500.0000 mg | ORAL_TABLET | Freq: Every day | ORAL | 0 refills | Status: DC

## 2023-04-22 NOTE — Progress Notes (Signed)
Subjective:  Patient ID: [name],  female    DOB: [birth year]  Age: 87 y.o.    CC: Medical Management of Chronic Issues   HPI [name] presents for  follow-up of hypertension. Patient has no history of headache chest pain or shortness of breath or recent cough. Patient also denies symptoms of TIA such as numbness weakness lateralizing. Patient denies side effects from medication. States taking it regularly.  Patient also  in for follow-up of elevated cholesterol. Doing well without complaints on current medication. Denies side effects  including myalgia and arthralgia and nausea. Also in today for liver function testing. Currently no chest pain, shortness of breath or other cardiovascular related symptoms noted.  Follow-up of prediabetes. Patient denies symptoms such as excessive hunger or urinary frequency, excessive hunger, nausea  Had colonoscopy last week. BLQ sore. Report mentioned diverticulitis.    History [name] has a past medical history of Acute on chronic respiratory failure with hypoxia (HCC) (04/24/2018), Anxiety, Atypical chest pain, Bronchospasm, CAD (coronary artery disease), Cervical disc disorder with myelopathy, unspecified cervical region, Cervicalgia (01/19/2009), Chest pain at rest (01/27/2015), Chronic systolic (congestive) heart failure (HCC), COPD (chronic obstructive pulmonary disease) (HCC), Coronary artery disease, De Quervain's disease (tenosynovitis) (04/02/2011), Disc disease with myelopathy, cervical, Essential hypertension, Hemorrhoids, Liver mass, Lung, cysts, congenital, Myocardial infarction (HCC), Nephrolithiasis, Nonischemic cardiomyopathy (HCC), On home O2, Osteoarthritis, Sprain of wrist (08/07/2012), and Thoracic ascending aortic aneurysm (HCC).   [name] has a past surgical history that includes Tonsillectomy and adenoidectomy; Complete hysterectomy; Benign breast tumors; Cholecystectomy; Colonoscopy; Colonoscopy (N/A, 09/22/2014); LEFT HEART CATH  AND CORONARY ANGIOGRAPHY (N/A, 11/23/2016); and CORONARY STENT INTERVENTION (N/A, 11/23/2016).   Her family history includes Aneurysm in her father; Diabetes in her brother; Heart disease in her brother, mother, and sister; Lung cancer in her brother.[name] reports that [name] quit smoking about 14 years ago. Her smoking use included cigarettes. [name] started smoking about 45 years ago. [name] has a 7.8 pack-year smoking history. [name] has never used smokeless tobacco. [name] reports that [name] does not drink alcohol and does not use drugs.  Current Outpatient Medications on File Prior to Visit  Medication Sig Dispense Refill   albuterol (VENTOLIN HFA) 108 (90 Base) MCG/ACT inhaler Inhale 2 puffs into the lungs every 6 (six) hours as needed for wheezing or shortness of breath. 24 g 3   alendronate (FOSAMAX) 70 MG tablet Take 1 tablet (70 mg total) by mouth every 7 (seven) days. Take with a full glass of water on an empty stomach. Do not lay down for at least 2 hours 4 tablet 11   aspirin EC 81 MG tablet Take 81 mg by mouth every morning.      carvedilol (COREG) 3.125 MG tablet Take 1 tablet (3.125 mg total) by mouth 2 (two) times daily with a meal. (Patient taking differently: Take 3.125 mg by mouth daily as needed (high bp).) 60 tablet 11   Cholecalciferol (VITAMIN D) 50 MCG (2000 UT) tablet Take 1 tablet (2,000 Units total) by mouth daily. 90 tablet 3   dicyclomine (BENTYL) 10 MG capsule Take 1 capsule (10 mg total) by mouth every 8 (eight) hours as needed (abdominal pain). 60 capsule 1   fenofibrate (TRICOR) 48 MG tablet Take 1 tablet (48 mg total) by mouth daily. 90 tablet 0   Fluticasone-Umeclidin-Vilant (TRELEGY ELLIPTA) 100-62.5-25 MCG/ACT AEPB Inhale 1 puff into the lungs daily in the afternoon. 180 each 3   isosorbide mononitrate (IMDUR) 30 MG 24 hr tablet  Take 1 tablet (30 mg total) by mouth daily.     ondansetron (ZOFRAN) 4 MG tablet Take 1 tablet (4 mg total) by mouth every 8 (eight) hours as needed for  nausea or vomiting. 20 tablet 0   OXYGEN Inhale 2 L into the lungs at bedtime. Can use in the morning as needed     pantoprazole (PROTONIX) 40 MG tablet Take 1 tablet (40 mg total) by mouth daily. 30 tablet 1   rosuvastatin (CRESTOR) 20 MG tablet Take 1 tablet (20 mg total) by mouth daily. 30 tablet 5   sacubitril-valsartan (ENTRESTO) 24-26 MG TAKE 1 TABLET BY MOUTH TWICE DAILY. 60 tablet 5   Sod Picosulfate-Mag Ox-Cit Acd (CLENPIQ) 10-3.5-12 MG-GM -GM/175ML SOLN Take 1 kit by mouth as directed. 175 mL 0   traMADol (ULTRAM) 50 MG tablet Take 1 tablet (50 mg total) by mouth every 12 (twelve) hours as needed for moderate pain. 30 tablet 5   triamterene-hydrochlorothiazide (MAXZIDE-25) 37.5-25 MG tablet Take 1 tablet by mouth daily. For blood pressure and fluid (Patient taking differently: Take 1 tablet by mouth daily as needed (blood pressure and fluid).) 90 tablet 3   No current facility-administered medications on file prior to visit.    ROS Review of Systems  Constitutional: Negative.   HENT: Negative.    Eyes:  Negative for visual disturbance.  Respiratory:  Negative for shortness of breath.   Cardiovascular:  Negative for chest pain.  Gastrointestinal:  Negative for abdominal pain.  Musculoskeletal:  Negative for arthralgias.    Objective:  BP 138/61   Pulse 91   Temp (!) 97.2 F (36.2 C)   Ht 5\' 1"  (1.549 m)   Wt 159 lb 9.6 oz (72.4 kg)   SpO2 96%   BMI 30.16 kg/m   BP Readings from Last 3 Encounters:  04/22/23 138/61  04/16/23 116/65  04/09/23 125/64    Wt Readings from Last 3 Encounters:  04/22/23 159 lb 9.6 oz (72.4 kg)  04/16/23 164 lb 0.4 oz (74.4 kg)  04/09/23 164 lb 0.4 oz (74.4 kg)     Physical Exam Constitutional:      General: [name] is not in acute distress.    Appearance: [name] is well-developed.  Cardiovascular:     Rate and Rhythm: Normal rate and regular rhythm.  Pulmonary:     Breath sounds: Normal breath sounds.  Abdominal:     Palpations:  Abdomen is soft.     Tenderness: There is abdominal tenderness (BLQ). There is no guarding or rebound.  Musculoskeletal:        General: Normal range of motion.  Skin:    General: Skin is warm and dry.  Neurological:     Mental Status: [name] is alert and oriented to person, place, and time.     Diabetic Foot Exam - Simple   No data filed     Lab Results  Component Value Date   HGBA1C 5.9 (H) 10/22/2022   HGBA1C 5.9 (H) 06/18/2022   HGBA1C 6.0 (H) 12/28/2021    Assessment & Plan:   [name] was seen today for medical management of chronic issues.  Diagnoses and all orders for this visit:  Mixed hyperlipidemia -     Lipid panel  Essential hypertension -     CBC with Differential/Platelet -     CMP14+EGFR  Prediabetes -     Bayer DCA Hb A1c Waived  Centrilobular emphysema (HCC)  Chronic combined systolic and diastolic CHF (congestive heart failure) (HCC)  Acute  diverticulitis  Other orders -     levofloxacin (LEVAQUIN) 500 MG tablet; Take 1 tablet (500 mg total) by mouth daily.   I am having [name] start on levofloxacin. I am also having her maintain her aspirin EC, OXYGEN, triamterene-hydrochlorothiazide, carvedilol, alendronate, Vitamin D, traMADol, rosuvastatin, albuterol, Trelegy Ellipta, Entresto, pantoprazole, dicyclomine, fenofibrate, ondansetron, isosorbide mononitrate, and Clenpiq.  Meds ordered this encounter  Medications   levofloxacin (LEVAQUIN) 500 MG tablet    Sig: Take 1 tablet (500 mg total) by mouth daily.    Dispense:  7 tablet    Refill:  0     Follow-up: Return in about 3 months (around 07/21/2023), or if symptoms worsen or fail to improve.  Mechele Claude, M.D.

## 2023-04-23 ENCOUNTER — Ambulatory Visit (INDEPENDENT_AMBULATORY_CARE_PROVIDER_SITE_OTHER): Payer: 59

## 2023-04-23 VITALS — Ht 61.0 in | Wt 159.0 lb

## 2023-04-23 DIAGNOSIS — Z0000 Encounter for general adult medical examination without abnormal findings: Secondary | ICD-10-CM | POA: Diagnosis not present

## 2023-04-23 LAB — CMP14+EGFR
ALT: 10 [IU]/L (ref 0–32)
AST: 20 [IU]/L (ref 0–40)
Albumin: 4.3 g/dL (ref 3.7–4.7)
Alkaline Phosphatase: 64 [IU]/L (ref 44–121)
BUN/Creatinine Ratio: 11 — ABNORMAL LOW (ref 12–28)
BUN: 8 mg/dL (ref 8–27)
Bilirubin Total: 0.5 mg/dL (ref 0.0–1.2)
CO2: 26 mmol/L (ref 20–29)
Calcium: 10.8 mg/dL — ABNORMAL HIGH (ref 8.7–10.3)
Chloride: 101 mmol/L (ref 96–106)
Creatinine, Ser: 0.7 mg/dL (ref 0.57–1.00)
Globulin, Total: 2.1 g/dL (ref 1.5–4.5)
Glucose: 106 mg/dL — ABNORMAL HIGH (ref 70–99)
Potassium: 4.6 mmol/L (ref 3.5–5.2)
Sodium: 143 mmol/L (ref 134–144)
Total Protein: 6.4 g/dL (ref 6.0–8.5)
eGFR: 85 mL/min/{1.73_m2} (ref >59)

## 2023-04-23 LAB — CBC WITH DIFFERENTIAL/PLATELET
Basophils Absolute: 0.1 10*3/uL (ref 0.0–0.2)
Basos: 1 %
EOS (ABSOLUTE): 0.5 10*3/uL — ABNORMAL HIGH (ref 0.0–0.4)
Eos: 6 %
Hematocrit: 40.1 % (ref 34.0–46.6)
Hemoglobin: 12.6 g/dL (ref 11.1–15.9)
Immature Grans (Abs): 0 10*3/uL (ref 0.0–0.1)
Immature Granulocytes: 0 %
Lymphocytes Absolute: 2.4 10*3/uL (ref 0.7–3.1)
Lymphs: 32 %
MCH: 26.6 pg (ref 26.6–33.0)
MCHC: 31.4 g/dL — ABNORMAL LOW (ref 31.5–35.7)
MCV: 85 fL (ref 79–97)
Monocytes Absolute: 0.8 10*3/uL (ref 0.1–0.9)
Monocytes: 11 %
Neutrophils Absolute: 3.9 10*3/uL (ref 1.4–7.0)
Neutrophils: 50 %
Platelets: 304 10*3/uL (ref 150–450)
RBC: 4.73 x10E6/uL (ref 3.77–5.28)
RDW: 14.6 % (ref 11.7–15.4)
WBC: 7.7 10*3/uL (ref 3.4–10.8)

## 2023-04-23 LAB — LIPID PANEL
Cholesterol, Total: 230 mg/dL — ABNORMAL HIGH (ref 100–199)
HDL: 79 mg/dL (ref >39)
LDL CALC COMMENT:: 2.9 ratio (ref 0.0–4.4)
LDL Chol Calc (NIH): 134 mg/dL — ABNORMAL HIGH (ref 0–99)
Triglycerides: 99 mg/dL (ref 0–149)
VLDL Cholesterol Cal: 17 mg/dL (ref 5–40)

## 2023-04-23 NOTE — Progress Notes (Signed)
Hello Tishanna,  Your lab result is normal and/or stable.Some minor variations that are not significant are commonly marked abnormal, but do not represent any medical problem for you.  Best regards, Gracie Gupta, M.D.

## 2023-04-23 NOTE — Patient Instructions (Signed)
Ms. [name] , Thank you for taking time to come for your Medicare Wellness Visit. I appreciate your ongoing commitment to your health goals. Please review the following plan we discussed and let me know if I can assist you in the future.   Referrals/Orders/Follow-Ups/Clinician Recommendations: Aim for 30 minutes of exercise or brisk walking, 6-8 glasses of water, and 5 servings of fruits and vegetables each day.  This is a list of the screening recommended for you and due dates:  Health Maintenance  Topic Date Due   DTaP/Tdap/Td vaccine (1 - Tdap) Never done   Zoster (Shingles) Vaccine (1 of 2) Never done   COVID-19 Vaccine (1) 05/08/2023*   Flu Shot  08/12/2023*   Pneumonia Vaccine (2 of 2 - PCV) 10/22/2023*   Medicare Annual Wellness Visit  04/22/2024   DEXA scan (bone density measurement)  Completed   HPV Vaccine  Aged Out  *Topic was postponed. The date shown is not the original due date.    Advanced directives: (ACP Link)Information on Advanced Care Planning can be found at [facility] Advance Health Care Directives Advance Health Care Directives ([email or web address]   Next Medicare Annual Wellness Visit scheduled for next year: Yes

## 2023-04-23 NOTE — Progress Notes (Signed)
Subjective:   [name] is a 87 y.o. female who presents for Medicare Annual (Subsequent) preventive examination.  Visit Complete: Virtual I connected with  [name] on 04/23/23 by a audio enabled telemedicine application and verified that I am speaking with the correct person using two identifiers.  Patient Location: Home  Provider Location: Home Office  I discussed the limitations of evaluation and management by telemedicine. The patient expressed understanding and agreed to proceed.  Vital Signs: Because this visit was a virtual/telehealth visit, some criteria may be missing or patient reported. Any vitals not documented were not able to be obtained and vitals that have been documented are patient reported.  Cardiac Risk Factors include: advanced age (>62men, >4 women);hypertension;sedentary lifestyle     Objective:    Today's Vitals   04/23/23 1516  Weight: 159 lb (72.1 kg)  Height: 5\' 1"  (1.549 m)   Body mass index is 30.04 kg/m.     04/23/2023    3:27 PM 04/09/2023    1:42 PM 02/28/2023   12:07 PM 02/27/2023   11:39 AM 02/03/2023    8:24 AM 04/20/2022    9:12 AM 02/06/2022    4:29 PM  Advanced Directives  Does Patient Have a Medical Advance Directive? No No No No No No Yes  Type of Tax inspector;Living will  Does patient want to make changes to medical advance directive?       No - Patient declined  Copy of Healthcare Power of Attorney in Chart?       No - copy requested  Would patient like information on creating a medical advance directive? Yes (MAU/Ambulatory/Procedural Areas - Information given) No - Patient declined No - Patient declined No - Patient declined No - Patient declined No - Patient declined     Current Medications (verified) Outpatient Encounter Medications as of 04/23/2023  Medication Sig   albuterol (VENTOLIN HFA) 108 (90 Base) MCG/ACT inhaler Inhale 2 puffs into the lungs every 6 (six) hours as  needed for wheezing or shortness of breath.   alendronate (FOSAMAX) 70 MG tablet Take 1 tablet (70 mg total) by mouth every 7 (seven) days. Take with a full glass of water on an empty stomach. Do not lay down for at least 2 hours   aspirin EC 81 MG tablet Take 81 mg by mouth every morning.    carvedilol (COREG) 3.125 MG tablet Take 1 tablet (3.125 mg total) by mouth 2 (two) times daily with a meal. (Patient taking differently: Take 3.125 mg by mouth daily as needed (high bp).)   Cholecalciferol (VITAMIN D) 50 MCG (2000 UT) tablet Take 1 tablet (2,000 Units total) by mouth daily.   dicyclomine (BENTYL) 10 MG capsule Take 1 capsule (10 mg total) by mouth every 8 (eight) hours as needed (abdominal pain).   fenofibrate (TRICOR) 48 MG tablet Take 1 tablet (48 mg total) by mouth daily.   Fluticasone-Umeclidin-Vilant (TRELEGY ELLIPTA) 100-62.5-25 MCG/ACT AEPB Inhale 1 puff into the lungs daily in the afternoon.   isosorbide mononitrate (IMDUR) 30 MG 24 hr tablet Take 1 tablet (30 mg total) by mouth daily.   levofloxacin (LEVAQUIN) 500 MG tablet Take 1 tablet (500 mg total) by mouth daily.   ondansetron (ZOFRAN) 4 MG tablet Take 1 tablet (4 mg total) by mouth every 8 (eight) hours as needed for nausea or vomiting.   OXYGEN Inhale 2 L into the lungs at bedtime. Can use  in the morning as needed   pantoprazole (PROTONIX) 40 MG tablet Take 1 tablet (40 mg total) by mouth daily.   rosuvastatin (CRESTOR) 20 MG tablet Take 1 tablet (20 mg total) by mouth daily.   sacubitril-valsartan (ENTRESTO) 24-26 MG TAKE 1 TABLET BY MOUTH TWICE DAILY.   Sod Picosulfate-Mag Ox-Cit Acd (CLENPIQ) 10-3.5-12 MG-GM -GM/175ML SOLN Take 1 kit by mouth as directed.   traMADol (ULTRAM) 50 MG tablet Take 1 tablet (50 mg total) by mouth every 12 (twelve) hours as needed for moderate pain.   triamterene-hydrochlorothiazide (MAXZIDE-25) 37.5-25 MG tablet Take 1 tablet by mouth daily. For blood pressure and fluid (Patient taking differently:  Take 1 tablet by mouth daily as needed (blood pressure and fluid).)   No facility-administered encounter medications on file as of 04/23/2023.    Allergies (verified) Plavix [clopidogrel bisulfate], Calcium channel blockers, Alprazolam, Codeine, Percodan [oxycodone-aspirin], Statins, and Valium   History: Past Medical History:  Diagnosis Date   Acute on chronic respiratory failure with hypoxia (HCC) 04/24/2018   Anxiety    Atypical chest pain    Bronchospasm    CAD (coronary artery disease)    a. s/p DES to mid-LAD and DES to mid-OM1 in 11/2016   Cervical disc disorder with myelopathy, unspecified cervical region    Cervicalgia 01/19/2009   Qualifier: Diagnosis of  By: Romeo Apple MD, Stanley     Chest pain at rest 01/27/2015   Chronic systolic (congestive) heart failure (HCC)    COPD (chronic obstructive pulmonary disease) (HCC)    Coronary artery disease    De Quervain's disease (tenosynovitis) 04/02/2011   Disc disease with myelopathy, cervical    Essential hypertension    Hemorrhoids    Liver mass    Lung, cysts, congenital    Left lung cyst   Myocardial infarction (HCC)    Nephrolithiasis    Embedded   Nonischemic cardiomyopathy (HCC)    LVEF 35-40% 2015   On home O2    Osteoarthritis    Sprain of wrist 08/07/2012   Thoracic ascending aortic aneurysm (HCC)    4.3 cm April 2016   Past Surgical History:  Procedure Laterality Date   Benign breast tumors     CHOLECYSTECTOMY     COLONOSCOPY     COLONOSCOPY N/A 09/22/2014   Procedure: COLONOSCOPY;  Surgeon: Malissa Hippo, MD;  Location: [facility];  Service: Endoscopy;  Laterality: N/A;  830 -- to be done in OR under fluoro   Complete hysterectomy     CORONARY STENT INTERVENTION N/A 11/23/2016   Procedure: Coronary Stent Intervention;  Surgeon: Swaziland, Peter M, MD;  Location: [facility] INVASIVE CV LAB;  Service: Cardiovascular;  Laterality: N/A;   LEFT HEART CATH AND CORONARY ANGIOGRAPHY N/A 11/23/2016   Procedure: Left Heart  Cath and Coronary Angiography;  Surgeon: Swaziland, Peter M, MD;  Location: [facility] INVASIVE CV LAB;  Service: Cardiovascular;  Laterality: N/A;   TONSILLECTOMY AND ADENOIDECTOMY     Family History  Problem Relation Age of Onset   Heart disease Mother    Aneurysm Father    Lung cancer Brother    Heart disease Sister    Diabetes Brother    Heart disease Brother    Social History   Socioeconomic History   Marital status: Widowed    Spouse name: Not on file   Number of children: Not on file   Years of education: 9th   Highest education level: Not on file  Occupational History    Employer: RETIRED  Tobacco Use   Smoking status: Former    Current packs/day: 0.00    Average packs/day: 0.3 packs/day for 31.0 years (7.8 ttl pk-yrs)    Types: Cigarettes    Start date: 05/14/1977    Quit date: 05/28/2008    Years since quitting: 14.9   Smokeless tobacco: Never   Tobacco comments:    patient states she only smoked for 3 years total  Vaping Use   Vaping status: Never Used  Substance and Sexual Activity   Alcohol use: No    Alcohol/week: 0.0 standard drinks of alcohol   Drug use: No   Sexual activity: Not Currently    Birth control/protection: Surgical    Comment: hyst  Other Topics Concern   Not on file  Social History Narrative   Not on file   Social Determinants of Health   Financial Resource Strain: Low Risk  (04/23/2023)   Overall Financial Resource Strain (CARDIA)    Difficulty of Paying Living Expenses: Not hard at all  Food Insecurity: No Food Insecurity (04/23/2023)   Hunger Vital Sign    Worried About Running Out of Food in the Last Year: Never true    Ran Out of Food in the Last Year: Never true  Transportation Needs: No Transportation Needs (04/23/2023)   PRAPARE - Administrator, Civil Service (Medical): No    Lack of Transportation (Non-Medical): No  Physical Activity: Inactive (04/23/2023)   Exercise Vital Sign    Days of Exercise per Week: 0 days     Minutes of Exercise per Session: 0 min  Stress: No Stress Concern Present (04/23/2023)   [employer] of Occupational Health - Occupational Stress Questionnaire    Feeling of Stress : Not at all  Social Connections: Moderately Isolated (04/23/2023)   Social Connection and Isolation Panel [NHANES]    Frequency of Communication with Friends and Family: More than three times a week    Frequency of Social Gatherings with Friends and Family: Three times a week    Attends Religious Services: 1 to 4 times per year    Active Member of Clubs or Organizations: No    Attends Banker Meetings: Never    Marital Status: Widowed    Tobacco Counseling Counseling given: Not Answered Tobacco comments: patient states she only smoked for 3 years total   Clinical Intake:  Pre-visit preparation completed: Yes  Pain : No/denies pain     Diabetes: No  How often do you need to have someone help you when you read instructions, pamphlets, or other written materials from your doctor or pharmacy?: 1 - Never  Interpreter Needed?: No  Information entered by :: Kandis Fantasia LPN   Activities of Daily Living    04/23/2023    3:26 PM 04/09/2023    1:39 PM  In your present state of health, do you have any difficulty performing the following activities:  Hearing? 1 1  Comment  hearing aid in her left ear  Vision? 0 0  Difficulty concentrating or making decisions? 0 0  Walking or climbing stairs? 1   Dressing or bathing? 0   Doing errands, shopping? 1   Preparing Food and eating ? N   Using the Toilet? N   In the past six months, have you accidently leaked urine? N   Do you have problems with loss of bowel control? N   Managing your Medications? N   Managing your Finances? N   Housekeeping or managing your Housekeeping?  Y     Patient Care Team: Mechele Claude, MD as PCP - General (Family Medicine) Wendall Stade, MD as PCP - Cardiology (Cardiology)  Indicate any recent  Medical Services you may have received from other than [facility] providers in the past year (date may be approximate).     Assessment:   This is a routine wellness examination for Kooper.  Hearing/Vision screen Hearing Screening - Comments:: Some hearing loss   Vision Screening - Comments:: No vision problems; will schedule routine eye exam soon     Goals Addressed   None   Depression Screen    04/23/2023    3:25 PM 04/22/2023   10:53 AM 04/22/2023   10:40 AM 03/12/2023   11:53 AM 02/12/2023   11:40 AM 01/24/2023    3:10 PM 10/22/2022   10:28 AM  PHQ 2/9 Scores  PHQ - 2 Score 5 5 0 2 0  0  PHQ- 9 Score 17 17  8   3   Exception Documentation      Patient refusal     Fall Risk    04/23/2023    3:26 PM 04/22/2023   10:53 AM 04/22/2023   10:40 AM 03/12/2023   11:53 AM 02/12/2023   11:40 AM  Fall Risk   Falls in the past year? 0 0 0 0 0  Number falls in past yr: 0   0   Injury with Fall? 0   0   Risk for fall due to : Impaired balance/gait;Impaired mobility   Impaired mobility;No Fall Risks;Orthopedic patient   Follow up Falls prevention discussed;Education provided;Falls evaluation completed   Falls evaluation completed;Education provided     MEDICARE RISK AT HOME: Medicare Risk at Home Any stairs in or around the home?: No If so, are there any without handrails?: No Home free of loose throw rugs in walkways, pet beds, electrical cords, etc?: Yes Adequate lighting in your home to reduce risk of falls?: Yes Life alert?: No Use of a cane, walker or w/c?: Yes Grab bars in the bathroom?: Yes Shower chair or bench in shower?: No Elevated toilet seat or a handicapped toilet?: Yes  TIMED UP AND GO:  Was the test performed?  No    Cognitive Function:        04/23/2023    3:27 PM 04/20/2022    9:13 AM 01/06/2021    4:17 PM  6CIT Screen  What Year? 0 points 0 points 4 points  What month? 0 points 0 points 0 points  What time? 0 points 0 points 0 points  Count back from 20 0  points 0 points 4 points  Months in reverse 2 points 0 points 4 points  Repeat phrase 4 points 0 points 4 points  Total Score 6 points 0 points 16 points    Immunizations Immunization History  Administered Date(s) Administered   Fluad Quad(high Dose 65+) 04/03/2022   Influenza, High Dose Seasonal PF 04/03/2017, 04/28/2018, 03/15/2019   Influenza-Unspecified 12/13/2014   Pneumococcal Polysaccharide-23 07/12/2015    TDAP status: Due, Education has been provided regarding the importance of this vaccine. Advised may receive this vaccine at local pharmacy or Health Dept. Aware to provide a copy of the vaccination record if obtained from local pharmacy or Health Dept. Verbalized acceptance and understanding.  Flu Vaccine status: Due, Education has been provided regarding the importance of this vaccine. Advised may receive this vaccine at local pharmacy or Health Dept. Aware to provide a copy of the vaccination record if  obtained from local pharmacy or Health Dept. Verbalized acceptance and understanding.  Pneumococcal vaccine status: Due, Education has been provided regarding the importance of this vaccine. Advised may receive this vaccine at local pharmacy or Health Dept. Aware to provide a copy of the vaccination record if obtained from local pharmacy or Health Dept. Verbalized acceptance and understanding.  Covid-19 vaccine status: Information provided on how to obtain vaccines.   Qualifies for Shingles Vaccine? Yes   Zostavax completed No   Shingrix Completed?: No.    Education has been provided regarding the importance of this vaccine. Patient has been advised to call insurance company to determine out of pocket expense if they have not yet received this vaccine. Advised may also receive vaccine at local pharmacy or Health Dept. Verbalized acceptance and understanding.  Screening Tests Health Maintenance  Topic Date Due   DTaP/Tdap/Td (1 - Tdap) Never done   Zoster Vaccines- Shingrix (1  of 2) Never done   COVID-19 Vaccine (1) 05/08/2023 (Originally 07/21/1940)   INFLUENZA VACCINE  08/12/2023 (Originally 12/13/2022)   Pneumonia Vaccine 21+ Years old (2 of 2 - PCV) 10/22/2023 (Originally 07/11/2016)   Medicare Annual Wellness (AWV)  04/22/2024   DEXA SCAN  Completed   HPV VACCINES  Aged Out    Health Maintenance  Health Maintenance Due  Topic Date Due   DTaP/Tdap/Td (1 - Tdap) Never done   Zoster Vaccines- Shingrix (1 of 2) Never done    Colorectal cancer screening: No longer required.   Mammogram status: No longer required due to age and preference .  Bone Density status: Completed 09/26/21. Results reflect: Bone density results: OSTEOPOROSIS. Repeat every 2 years.  Lung Cancer Screening: (Low Dose CT Chest recommended if Age 48-80 years, 20 pack-year currently smoking OR have quit w/in 15years.) does not qualify.   Lung Cancer Screening Referral: n/a  Additional Screening:  Hepatitis C Screening: does not qualify  Vision Screening: Recommended annual ophthalmology exams for early detection of glaucoma and other disorders of the eye. Is the patient up to date with their annual eye exam?  No  Who is the provider or what is the name of the office in which the patient attends annual eye exams? none If pt is not established with a provider, would they like to be referred to a provider to establish care? No .   Dental Screening: Recommended annual dental exams for proper oral hygien  Community Resource Referral / Chronic Care Management: CRR required this visit?  No   CCM required this visit?  No     Plan:     I have personally reviewed and noted the following in the patient's chart:   Medical and social history Use of alcohol, tobacco or illicit drugs  Current medications and supplements including opioid prescriptions. Patient is not currently taking opioid prescriptions. Functional ability and status Nutritional status Physical activity Advanced  directives List of other physicians Hospitalizations, surgeries, and ER visits in previous 12 months Vitals Screenings to include cognitive, depression, and falls Referrals and appointments  In addition, I have reviewed and discussed with patient certain preventive protocols, quality metrics, and best practice recommendations. A written personalized care plan for preventive services as well as general preventive health recommendations were provided to patient.     Kandis Fantasia [place], [place]   16/02/9603   After Visit Summary: (Mail) Due to this being a telephonic visit, the after visit summary with patients personalized plan was offered to patient via mail   Nurse Notes:  No concerns at this time

## 2023-04-24 ENCOUNTER — Encounter (HOSPITAL_COMMUNITY): Payer: Self-pay | Admitting: Gastroenterology

## 2023-04-25 ENCOUNTER — Encounter (INDEPENDENT_AMBULATORY_CARE_PROVIDER_SITE_OTHER): Payer: Self-pay | Admitting: *Deleted

## 2023-04-29 ENCOUNTER — Telehealth: Payer: Self-pay | Admitting: Family Medicine

## 2023-04-29 NOTE — Telephone Encounter (Signed)
Patient aware and states she has and they tell her to contact her PCP

## 2023-04-29 NOTE — Telephone Encounter (Signed)
Please let the patient know that I sent their prescription to their pharmacy. Thanks, WS 

## 2023-04-29 NOTE — Telephone Encounter (Signed)
Copied from CRM [id number]. Topic: Clinical - Medication Refill >> Apr 29, 2023 10:47 AM Almira Coaster wrote: Most Recent Primary Care Visit:  Provider: Anthoney Harada  Department: [facility]  Visit Type: MEDICARE AWV, SEQUENTIAL  Date: 04/23/2023  Medication: levofloxacin (LEVAQUIN) 500 MG tablet  Has the patient contacted their pharmacy? No (Agent: If no, request that the patient contact the pharmacy for the refill. If patient does not wish to contact the pharmacy document the reason why and proceed with request.) (Agent: If yes, when and what did the pharmacy advise?)  Is this the correct pharmacy for this prescription? Yes If no, delete pharmacy and type the correct one.  This is the patient's preferred pharmacy:  [facility] - [place], [place] - [address] [place] [address] Phone: [phone] Fax: [phone]   Has the prescription been filled recently? Yes  Is the patient out of the medication? Yes  Has the patient been seen for an appointment in the last year OR does the patient have an upcoming appointment? Yes  Can we respond through MyChart? Yes  Agent: Please be advised that Rx refills may take up to 3 business days. We ask that you follow-up with your pharmacy.

## 2023-04-29 NOTE — Telephone Encounter (Signed)
Patient notified and verbalized understanding. She is very concerned about this ongoing abdominal issue. States that it has been going on for 5 months. She had her colonoscopy and states that her niece had one as well and was still having pain afterward and they sent her for further testing and she has some type of cancer. Patient is worried this may be her problem as well. Please advise

## 2023-04-29 NOTE — Telephone Encounter (Signed)
Patient was seen 04/22/23 and given Levaquin for report of diverticulitis on colonoscopy report.  Patient said she is still having this pain and would like to know if you can refill the medication.  Had colonoscopy last week. BLQ sore. Report mentioned diverticulitis.

## 2023-04-29 NOTE — Telephone Encounter (Signed)
She should follow up with the doctor who did the colonoscopy.

## 2023-04-29 NOTE — Telephone Encounter (Signed)
I treated her for diverticulitis. If not better, she will need to return here. Please reassure her that the colonoscopy and the CT from October would have found any cancer that might have been present. Since they did not, she can be assured that she does not have an abdominal cancer causing her pain.

## 2023-04-30 ENCOUNTER — Ambulatory Visit: Payer: Self-pay | Admitting: Family Medicine

## 2023-04-30 ENCOUNTER — Telehealth: Payer: Self-pay | Admitting: Family Medicine

## 2023-04-30 NOTE — Telephone Encounter (Signed)
Pt. Needs to be seen for this. Thanks, WS 

## 2023-04-30 NOTE — Telephone Encounter (Signed)
APPOINTMENT HAS BEEN SCHEDULED

## 2023-04-30 NOTE — Telephone Encounter (Signed)
SCHEDULED APPOINTMENT FOR TOMORROW FOR FOLLOW UP DIVERTICULITITS

## 2023-04-30 NOTE — Telephone Encounter (Unsigned)
Copied from CRM [id number]. Topic: Clinical - Prescription Issue >> Apr 30, 2023  2:01 PM [name] wrote: Reason for CRM: patient having pain from Colonoscopy and having pain in right side , patient stated that Dr. Mechele Claude prescribe an antibiotic and patient went in person to the pharmacy and they do not have the prescription

## 2023-04-30 NOTE — Telephone Encounter (Signed)
Copied from CRM [id number]. Topic: Clinical - Red Word Triage >> Apr 30, 2023  2:08 PM Dennison Nancy wrote: Red Word that prompted transfer to Nurse Triage: Patient is experiencing right side pain , believe its coming from her colonscopy she had early this month  Chief Complaint: Right sided abdominal pain Symptoms: Pain Frequency: Since August Pertinent Negatives: Patient denies fever, vomiting, diarrhea, or any abnormal urination issues Disposition: [] ED /[] Urgent Care (no appt availability in office) / [x] Appointment(In office/virtual)/ []  [facility]/ [] Home Care/ [] Refused Recommended Disposition /[] [facility]/ []  Follow-up with PCP Additional Notes: Patient called and stated that she is still having right sided pain.  She said this has been going on since August (4 months ago) and she had a colonoscopy at the beginning of this month (about two weeks ago)  Patient states that she had polyps removed and had been told she had diverticulitis.  She believes that there is more in her abdomen that needs to be removed.  She said that she also was supposed to have some medicine sent to her pharmacy that was not at the pharmacy when she checked.  Initial call center agent advised this RN that she sent a message to the office about that medication prior to myself triaging the patient.  Patient states she has a bowel movement this morning and she is denying any fevers, blood in her stool, diarrhea, or vomiting.  While on the phone with the patient, she stated that [facility] was calling her and she hung up with me to answer that call.  I called patient back to finish her triage and she advised that the office had just called her and an appointment was made for her in the office tomorrow 04/30/23 at 2:55pm.  Patient also stated that she was having issues with possibly the pharmacy about a medication that she was told was for one thing and she believed it was for something else.  I advised her to  bring that bottle of medication in question to the office tomorrow to have the office take a look at it and advise her better on that.  Patient is also advised by this RN if anything changed or worsened to go to the Emergency Room or call 911. Patient verbalized understanding.  Reason for Disposition  Age > 60 years  Answer Assessment - Initial Assessment Questions 1. LOCATION: "Where does it hurt?"      Right side of abdomen 2. RADIATION: "Does the pain shoot anywhere else?" (e.g., chest, back)     Sometimes into the back 3. ONSET: "When did the pain begin?" (e.g., minutes, hours or days ago)      August (4 months ago) 4. SUDDEN: "Gradual or sudden onset?"     "Months" 5. PATTERN "Does the pain come and go, or is it constant?"    - If it comes and goes: "How long does it last?" "Do you have pain now?"     (Note: Comes and goes means the pain is intermittent. It goes away completely between bouts.)    - If constant: "Is it getting better, staying the same, or getting worse?"      (Note: Constant means the pain never goes away completely; most serious pain is constant and gets worse.)      "Sore for months but it's not like a 24 hour thing but it lets you know it's there" 6. SEVERITY: "How bad is the pain?"  (e.g., Scale 1-10; mild, moderate,  or severe)    - MILD (1-3): Doesn't interfere with normal activities, abdomen soft and not tender to touch.     - MODERATE (4-7): Interferes with normal activities or awakens from sleep, abdomen tender to touch.     - SEVERE (8-10): Excruciating pain, doubled over, unable to do any normal activities.       3 7. RECURRENT SYMPTOM: "Have you ever had this type of stomach pain before?" If Yes, ask: "When was the last time?" and "What happened that time?"      Unknown 8. CAUSE: "What do you think is causing the stomach pain?"     Patient thinks she is still having pain from diverticulitis--pt had a colonoscopy the first week of December and is still  having pain--she had polyps removed and she thinks she still has something that needs to be taken out 9. RELIEVING/AGGRAVATING FACTORS: "What makes it better or worse?" (e.g., antacids, bending or twisting motion, bowel movement)     Certain movements aggravate the pain and patient states she has a "girdle" that helps the pain 10. OTHER SYMPTOMS: "Do you have any other symptoms?" (e.g., back pain, diarrhea, fever, urination pain, vomiting)       Some nausea but patient states she has medication for that but hasn't had to take it in over a week  Protocols used: Abdominal Pain - [facility]

## 2023-04-30 NOTE — Telephone Encounter (Unsigned)
Copied from CRM [id number]. Topic: Clinical - Medication Refill >> Apr 30, 2023  1:55 PM Dennison Nancy wrote: Most Recent Primary Care Visit:  Provider: Anthoney Harada  Department: [facility]  Visit Type: MEDICARE AWV, SEQUENTIAL  Date: 04/23/2023  Medication: carvedilol (COREG) 3.125 MG tablet  Has the patient contacted their pharmacy? Yes patient went in person and the pharmacy do not have it   (Agent: If no, request that the patient contact the pharmacy for the refill. If patient does not wish to contact the pharmacy document the reason why and proceed with request.) (Agent: If yes, when and what did the pharmacy advise?)  Is this the correct pharmacy for this prescription? Yes  If no, delete pharmacy and type the correct one.  This is the patient's preferred pharmacy:  [facility] - [place], [place] - [address] [place] [address] Phone: [phone] Fax: [phone]   Has the prescription been filled recently? No   Is the patient out of the medication? No   Has the patient been seen for an appointment in the last year OR does the patient have an upcoming appointment? Yes   Can we respond through MyChart? No   Agent: Please be advised that Rx refills may take up to 3 business days. We ask that you follow-up with your pharmacy.

## 2023-04-30 NOTE — Telephone Encounter (Signed)
Patient's cardiologist prescribes. Will need to call his office for refill.

## 2023-05-01 ENCOUNTER — Ambulatory Visit (INDEPENDENT_AMBULATORY_CARE_PROVIDER_SITE_OTHER): Payer: 59

## 2023-05-01 ENCOUNTER — Ambulatory Visit (INDEPENDENT_AMBULATORY_CARE_PROVIDER_SITE_OTHER): Payer: 59 | Admitting: Family Medicine

## 2023-05-01 ENCOUNTER — Encounter: Payer: Self-pay | Admitting: Family Medicine

## 2023-05-01 VITALS — BP 126/66 | HR 80 | Temp 97.8°F | Ht 61.0 in | Wt 159.0 lb

## 2023-05-01 DIAGNOSIS — R1084 Generalized abdominal pain: Secondary | ICD-10-CM | POA: Diagnosis not present

## 2023-05-01 DIAGNOSIS — R1031 Right lower quadrant pain: Secondary | ICD-10-CM

## 2023-05-01 MED ORDER — LEVOFLOXACIN 500 MG PO TABS: 500.0000 mg | ORAL_TABLET | Freq: Every day | ORAL | 0 refills | Status: DC

## 2023-05-01 MED ORDER — SIMETHICONE 125 MG PO CHEW: 125.0000 mg | CHEWABLE_TABLET | Freq: Three times a day (TID) | ORAL | 0 refills | Status: DC

## 2023-05-01 MED ORDER — LINACLOTIDE 145 MCG PO CAPS: 145.0000 ug | ORAL_CAPSULE | Freq: Every day | ORAL | 5 refills | Status: DC

## 2023-05-01 NOTE — Telephone Encounter (Signed)
Pt aware and will have it sent to cardiologist.

## 2023-05-01 NOTE — Progress Notes (Signed)
Subjective:  Patient ID: [name], female    DOB: 03/03/1936  Age: 87 y.o. MRN: [id number]  CC: Diverticulitis   HPI [name] presents for continued diffuse abd pain. Seems worst at the RLQ. Soreness she states. Nauseated at times. Denies vomiting, constipation and diarrhea. Appetite fair. Noted to have diverticulitis on CT abd. 2 mos. Ago which is when her sx were new.      05/01/2023    2:43 PM 04/23/2023    3:25 PM 04/22/2023   10:53 AM  Depression screen PHQ 2/9  Decreased Interest 0 3 3  Down, Depressed, Hopeless 0 2 2  PHQ - 2 Score 0 5 5  Altered sleeping  1 1  Tired, decreased energy  3 3  Change in appetite  3 3  Feeling bad or failure about yourself   1 1  Trouble concentrating  3 3  Moving slowly or fidgety/restless  1 1  Suicidal thoughts  0 0  PHQ-9 Score  17 17  Difficult doing work/chores   Very difficult    History [name] has a past medical history of Acute on chronic respiratory failure with hypoxia (HCC) (04/24/2018), Anxiety, Atypical chest pain, Bronchospasm, CAD (coronary artery disease), Cervical disc disorder with myelopathy, unspecified cervical region, Cervicalgia (01/19/2009), Chest pain at rest (01/27/2015), Chronic systolic (congestive) heart failure (HCC), COPD (chronic obstructive pulmonary disease) (HCC), Coronary artery disease, De Quervain's disease (tenosynovitis) (04/02/2011), Disc disease with myelopathy, cervical, Essential hypertension, Hemorrhoids, Liver mass, Lung, cysts, congenital, Myocardial infarction (HCC), Nephrolithiasis, Nonischemic cardiomyopathy (HCC), On home O2, Osteoarthritis, Sprain of wrist (08/07/2012), and Thoracic ascending aortic aneurysm (HCC).   She has a past surgical history that includes Tonsillectomy and adenoidectomy; Complete hysterectomy; Benign breast tumors; Cholecystectomy; Colonoscopy; Colonoscopy (N/A, 09/22/2014); LEFT HEART CATH AND CORONARY ANGIOGRAPHY (N/A, 11/23/2016); CORONARY STENT INTERVENTION (N/A,  11/23/2016); Colonoscopy with propofol (N/A, 04/16/2023); and Polypectomy (04/16/2023).   Her family history includes Aneurysm in her father; Diabetes in her brother; Heart disease in her brother, mother, and sister; Lung cancer in her brother.She reports that she quit smoking about 14 years ago. Her smoking use included cigarettes. She started smoking about 46 years ago. She has a 7.8 pack-year smoking history. She has never used smokeless tobacco. She reports that she does not drink alcohol and does not use drugs.    ROS Review of Systems  Constitutional: Negative.   HENT: Negative.    Eyes:  Negative for visual disturbance.  Respiratory:  Negative for shortness of breath.   Cardiovascular:  Negative for chest pain.  Gastrointestinal:  Negative for abdominal pain.  Musculoskeletal:  Negative for arthralgias.    Objective:  BP 126/66   Pulse 80   Temp 97.8 F (36.6 C)   Ht 5\' 1"  (1.549 m)   Wt 159 lb (72.1 kg)   SpO2 95%   BMI 30.04 kg/m   BP Readings from Last 3 Encounters:  05/01/23 126/66  04/22/23 138/61  04/16/23 116/65    Wt Readings from Last 3 Encounters:  05/01/23 159 lb (72.1 kg)  04/23/23 159 lb (72.1 kg)  04/22/23 159 lb 9.6 oz (72.4 kg)     Physical Exam Constitutional:      General: She is not in acute distress.    Appearance: She is well-developed.  Cardiovascular:     Rate and Rhythm: Normal rate and regular rhythm.  Pulmonary:     Breath sounds: Normal breath sounds.  Musculoskeletal:        General:  Normal range of motion.  Skin:    General: Skin is warm and dry.  Neurological:     Mental Status: She is alert and oriented to person, place, and time.       Assessment & Plan:   [name] was seen today for diverticulitis.  Diagnoses and all orders for this visit:  RLQ abdominal pain  Generalized abdominal pain -     DG Abd 2 Views; Future -     CBC with Differential/Platelet -     CMP14+EGFR  Other orders -     levofloxacin  (LEVAQUIN) 500 MG tablet; Take 1 tablet (500 mg total) by mouth daily. -     simethicone (GAS-X EXTRA STRENGTH) 125 MG chewable tablet; Chew 1 tablet (125 mg total) by mouth 3 (three) times daily before meals. -     linaclotide (LINZESS) 145 MCG CAPS capsule; Take 1 capsule (145 mcg total) by mouth daily. To regulate bowel movements       I am having [name] start on simethicone and linaclotide. I am also having her maintain her aspirin EC, OXYGEN, triamterene-hydrochlorothiazide, carvedilol, alendronate, Vitamin D, traMADol, rosuvastatin, albuterol, Trelegy Ellipta, Entresto, pantoprazole, dicyclomine, fenofibrate, ondansetron, isosorbide mononitrate, Clenpiq, and levofloxacin.  Allergies as of 05/01/2023       Reactions   Plavix [clopidogrel Bisulfate] Itching   Severe itching   Calcium Channel Blockers Other (See Comments)   cramping   Alprazolam Nausea And Vomiting   Codeine Nausea And Vomiting   Percodan [oxycodone-aspirin] Nausea And Vomiting   Statins Other (See Comments)   Muscle cramps   Valium Nausea And Vomiting        Medication List        Accurate as of May 01, 2023 11:59 PM. If you have any questions, ask your nurse or doctor.          albuterol 108 (90 Base) MCG/ACT inhaler Commonly known as: Ventolin HFA Inhale 2 puffs into the lungs every 6 (six) hours as needed for wheezing or shortness of breath.   alendronate 70 MG tablet Commonly known as: FOSAMAX Take 1 tablet (70 mg total) by mouth every 7 (seven) days. Take with a full glass of water on an empty stomach. Do not lay down for at least 2 hours   aspirin EC 81 MG tablet Take 81 mg by mouth every morning.   carvedilol 3.125 MG tablet Commonly known as: Coreg Take 1 tablet (3.125 mg total) by mouth 2 (two) times daily with a meal. What changed:  when to take this reasons to take this   Clenpiq 10-3.5-12 MG-GM -GM/175ML Soln Generic drug: Sod Picosulfate-Mag Ox-Cit Acd Take 1  kit by mouth as directed.   dicyclomine 10 MG capsule Commonly known as: BENTYL Take 1 capsule (10 mg total) by mouth every 8 (eight) hours as needed (abdominal pain).   Entresto 24-26 MG Generic drug: sacubitril-valsartan TAKE 1 TABLET BY MOUTH TWICE DAILY.   fenofibrate 48 MG tablet Commonly known as: Tricor Take 1 tablet (48 mg total) by mouth daily.   isosorbide mononitrate 30 MG 24 hr tablet Commonly known as: IMDUR Take 1 tablet (30 mg total) by mouth daily.   levofloxacin 500 MG tablet Commonly known as: LEVAQUIN Take 1 tablet (500 mg total) by mouth daily.   linaclotide 145 MCG Caps capsule Commonly known as: Linzess Take 1 capsule (145 mcg total) by mouth daily. To regulate bowel movements Started by: [name]   ondansetron 4 MG tablet Commonly  known as: Zofran Take 1 tablet (4 mg total) by mouth every 8 (eight) hours as needed for nausea or vomiting.   OXYGEN Inhale 2 L into the lungs at bedtime. Can use in the morning as needed   pantoprazole 40 MG tablet Commonly known as: PROTONIX Take 1 tablet (40 mg total) by mouth daily.   rosuvastatin 20 MG tablet Commonly known as: Crestor Take 1 tablet (20 mg total) by mouth daily.   simethicone 125 MG chewable tablet Commonly known as: Gas-X Extra Strength Chew 1 tablet (125 mg total) by mouth 3 (three) times daily before meals. Started by: [name]   traMADol 50 MG tablet Commonly known as: ULTRAM Take 1 tablet (50 mg total) by mouth every 12 (twelve) hours as needed for moderate pain.   Trelegy Ellipta 100-62.5-25 MCG/ACT Aepb Generic drug: Fluticasone-Umeclidin-Vilant Inhale 1 puff into the lungs daily in the afternoon.   triamterene-hydrochlorothiazide 37.5-25 MG tablet Commonly known as: MAXZIDE-25 Take 1 tablet by mouth daily. For blood pressure and fluid What changed:  when to take this reasons to take this additional instructions   Vitamin D 50 MCG (2000 UT) tablet Take 1 tablet  (2,000 Units total) by mouth daily.         Follow-up: Return in about 3 weeks (around 05/22/2023).  Mechele Claude, M.D.

## 2023-05-09 ENCOUNTER — Other Ambulatory Visit: Payer: Self-pay | Admitting: *Deleted

## 2023-05-09 DIAGNOSIS — M7502 Adhesive capsulitis of left shoulder: Secondary | ICD-10-CM

## 2023-05-09 DIAGNOSIS — M0609 Rheumatoid arthritis without rheumatoid factor, multiple sites: Secondary | ICD-10-CM

## 2023-05-09 DIAGNOSIS — F419 Anxiety disorder, unspecified: Secondary | ICD-10-CM

## 2023-05-09 NOTE — Addendum Note (Signed)
Addended by: Julious Payer D on: 05/09/2023 11:50 AM   Modules accepted: Orders

## 2023-05-13 ENCOUNTER — Telehealth (INDEPENDENT_AMBULATORY_CARE_PROVIDER_SITE_OTHER): Payer: Self-pay

## 2023-05-13 ENCOUNTER — Other Ambulatory Visit: Payer: Self-pay | Admitting: Family Medicine

## 2023-05-13 DIAGNOSIS — M7502 Adhesive capsulitis of left shoulder: Secondary | ICD-10-CM

## 2023-05-13 DIAGNOSIS — M0609 Rheumatoid arthritis without rheumatoid factor, multiple sites: Secondary | ICD-10-CM

## 2023-05-13 MED ORDER — SIMETHICONE 125 MG PO CHEW: 125.0000 mg | CHEWABLE_TABLET | Freq: Three times a day (TID) | ORAL | 0 refills | Status: DC

## 2023-05-13 NOTE — Telephone Encounter (Signed)
Copied from CRM [id number]. Topic: Clinical - Medication Refill >> May 13, 2023  4:35 PM Gildardo Pounds wrote: Most Recent Primary Care Visit:  Provider: Mechele Claude  Department: [facility]  Visit Type: OFFICE VISIT  Date: 05/01/2023  Medication: clorazepate (TRANXENE) tablet 7.5 mg   Has the patient contacted their pharmacy? Yes (Agent: If no, request that the patient contact the pharmacy for the refill. If patient does not wish to contact the pharmacy document the reason why and proceed with request.) (Agent: If yes, when and what did the pharmacy advise?)  Is this the correct pharmacy for this prescription? Yes If no, delete pharmacy and type the correct one.  This is the patient's preferred pharmacy:  [facility] - [place], [place] - [address] [place] [address] Phone: [phone] Fax: [phone]   Has the prescription been filled recently? No  Is the patient out of the medication? Yes  Has the patient been seen for an appointment in the last year OR does the patient have an upcoming appointment? Yes  Can we respond through MyChart? No  Agent: Please be advised that Rx refills may take up to 3 business days. We ask that you follow-up with your pharmacy.

## 2023-05-13 NOTE — Telephone Encounter (Signed)
Patient called today to report she is still having issues with Right lower abdominal pain and constipation, on going since spring of the year.She reports she had an abdominal Xray done at [facility], that was ordered by Dr. Darlyn Read on 05/01/2023, the results are not in Epic for review. She notes that Dr. Darlyn Read started her on Levofloxacin 500 mg one per day, and Linzess 145 mcg daily, which patient states she has started on last week, and she reports this has not really helped her that much, she has had a couple of watery stools, but feels not having a complete movement. I read the note from Doctor Selina Cooley, whom had made note he sent in Simethicone 125 mg po tid before meals, she says she was not made aware of this. I did let the patient know that she needed to call Dr. Darlyn Read back and ask him what else she can do to get relief as we are limited to be able to do much without seeing the scan that was done,and we can not call to have it read as we are not the ordering office. I advised that I would make Dr.Castaneda aware of her issues,but since Dr. Selina Cooley was the treating Doctor, she needed to call him back for further instructions. Patient states understanding and will contact Dr.Stacks for further instructions and have him call for the report on the abdominal x ray that was ordered by his office on 05/01/2023. I have checked the schedule for tomorrow and we have nothing available to offer the patient. Chelsea, we have nothing available until Monday 05/20/2023 at 1:15 pm with you. Please advise

## 2023-05-14 ENCOUNTER — Telehealth: Payer: Self-pay | Admitting: Family Medicine

## 2023-05-14 NOTE — Telephone Encounter (Signed)
 Looks like we are still waiting to receive xray results from Radiology dept.  Copied from CRM [id number]. Topic: Referral - Question >> May 13, 2023  4:15 PM Graeme ORN wrote: Reason for CRM: Pt called in states provider where she was sent for colonoscopy is requesting a copay of recent xray results in order to present findings. Provider requesting: Toribio Eartha Flavors, MD Gastroenterology Fax #: [phone]

## 2023-05-14 NOTE — Telephone Encounter (Signed)
I called and left a message asked that the patient please return call to the office. 

## 2023-05-14 NOTE — Telephone Encounter (Signed)
 I called the patient today and let her know that per Chelsea:  Xray has not been read by radiologist though in reviewing images, it appears she has some stool in the colon (constipation), but should await further recommendations from Dr. Zollie, she had recent colonoscopy a few weeks ago. I would have her follow instructions from PCP and if not better from those recommendations, we can see her back in the office  I offered her an appointment here with one of the Np's and the patient declined stating she had a follow up with Dr. Zollie on 05/22/2023 and she will just follow up with him and if the issue is not resolved by him at that time she will call this office back to get an appointment with a provider here.

## 2023-05-20 ENCOUNTER — Other Ambulatory Visit: Payer: Self-pay | Admitting: Family Medicine

## 2023-05-20 DIAGNOSIS — M7502 Adhesive capsulitis of left shoulder: Secondary | ICD-10-CM

## 2023-05-20 DIAGNOSIS — M0609 Rheumatoid arthritis without rheumatoid factor, multiple sites: Secondary | ICD-10-CM

## 2023-05-21 ENCOUNTER — Other Ambulatory Visit: Payer: Self-pay | Admitting: Family Medicine

## 2023-05-21 ENCOUNTER — Telehealth: Payer: Self-pay

## 2023-05-21 DIAGNOSIS — M7502 Adhesive capsulitis of left shoulder: Secondary | ICD-10-CM

## 2023-05-21 DIAGNOSIS — M0609 Rheumatoid arthritis without rheumatoid factor, multiple sites: Secondary | ICD-10-CM

## 2023-05-21 MED ORDER — TRAMADOL HCL 50 MG PO TABS: 50.0000 mg | ORAL_TABLET | Freq: Two times a day (BID) | ORAL | 5 refills | Status: DC | PRN

## 2023-05-21 NOTE — Telephone Encounter (Signed)
 Copied from CRM [id number]. Topic: Clinical - Medication Question >> May 21, 2023  9:08 AM Ivette P wrote: Reason for CRM: Pt requesting callback about question on medication. [phone].

## 2023-05-21 NOTE — Telephone Encounter (Signed)
 Please let the patient know that I sent their prescription to their pharmacy. Thanks, WS

## 2023-05-21 NOTE — Telephone Encounter (Signed)
 Patient calling stating pharmacy said that her Tramadol  has been refused for ref ill.    Patient was last seen on 04/22/23 for chronic check up but medication was not sent in.   10/22/22 R #30  R-5  Does patient need to be seen again or can this be sent in?

## 2023-05-21 NOTE — Telephone Encounter (Signed)
 Patent aware and verbalizes understanding.

## 2023-05-22 ENCOUNTER — Other Ambulatory Visit: Payer: Self-pay | Admitting: Family Medicine

## 2023-05-22 ENCOUNTER — Ambulatory Visit: Payer: 59 | Admitting: Family Medicine

## 2023-05-28 ENCOUNTER — Other Ambulatory Visit (HOSPITAL_COMMUNITY): Payer: Self-pay

## 2023-05-30 ENCOUNTER — Other Ambulatory Visit: Payer: Self-pay | Admitting: Family Medicine

## 2023-06-07 ENCOUNTER — Other Ambulatory Visit: Payer: Self-pay | Admitting: Family Medicine

## 2023-06-07 DIAGNOSIS — F419 Anxiety disorder, unspecified: Secondary | ICD-10-CM

## 2023-06-07 MED ORDER — CLORAZEPATE DIPOTASSIUM 7.5 MG PO TABS: 7.5000 mg | ORAL_TABLET | Freq: Every day | ORAL | 5 refills | Status: DC | PRN

## 2023-06-07 NOTE — Telephone Encounter (Signed)
Had to r/s patients appt from 1/30 to 2/11 due to provider being out of office for surgery. Can PCP send in enough of this medicine to last pt until her appt on 2/11?   Pt uses [facility].

## 2023-06-11 ENCOUNTER — Emergency Department (HOSPITAL_COMMUNITY)
Admission: EM | Admit: 2023-06-11 | Discharge: 2023-06-11 | Disposition: A | Discharge status: Home / Self Care | Payer: 59 | Attending: Emergency Medicine | Admitting: Emergency Medicine

## 2023-06-11 ENCOUNTER — Emergency Department (HOSPITAL_COMMUNITY): Payer: 59

## 2023-06-11 ENCOUNTER — Other Ambulatory Visit: Payer: Self-pay

## 2023-06-11 ENCOUNTER — Encounter (HOSPITAL_COMMUNITY): Payer: Self-pay | Admitting: *Deleted

## 2023-06-11 DIAGNOSIS — R002 Palpitations: Secondary | ICD-10-CM | POA: Diagnosis present

## 2023-06-11 DIAGNOSIS — I2510 Atherosclerotic heart disease of native coronary artery without angina pectoris: Secondary | ICD-10-CM | POA: Insufficient documentation

## 2023-06-11 DIAGNOSIS — J449 Chronic obstructive pulmonary disease, unspecified: Secondary | ICD-10-CM | POA: Insufficient documentation

## 2023-06-11 DIAGNOSIS — I509 Heart failure, unspecified: Secondary | ICD-10-CM | POA: Insufficient documentation

## 2023-06-11 DIAGNOSIS — Z79899 Other long term (current) drug therapy: Secondary | ICD-10-CM | POA: Insufficient documentation

## 2023-06-11 DIAGNOSIS — Z7982 Long term (current) use of aspirin: Secondary | ICD-10-CM | POA: Insufficient documentation

## 2023-06-11 DIAGNOSIS — I110 Hypertensive heart disease with heart failure: Secondary | ICD-10-CM | POA: Diagnosis not present

## 2023-06-11 LAB — BASIC METABOLIC PANEL
Anion gap: 12 (ref 5–15)
BUN: 12 mg/dL (ref 8–23)
CO2: 27 mmol/L (ref 22–32)
Calcium: 10.2 mg/dL (ref 8.9–10.3)
Chloride: 102 mmol/L (ref 98–111)
Creatinine, Ser: 0.64 mg/dL (ref 0.44–1.00)
GFR, Estimated: >60 mL/min (ref >60)
Glucose, Bld: 135 mg/dL — ABNORMAL HIGH (ref 70–99)
Potassium: 3.7 mmol/L (ref 3.5–5.1)
Sodium: 141 mmol/L (ref 135–145)

## 2023-06-11 LAB — CBC
HCT: 38.9 % (ref 36.0–46.0)
Hemoglobin: 12.1 g/dL (ref 12.0–15.0)
MCH: 26 pg (ref 26.0–34.0)
MCHC: 31.1 g/dL (ref 30.0–36.0)
MCV: 83.7 fL (ref 80.0–100.0)
Platelets: 315 10*3/uL (ref 150–400)
RBC: 4.65 MIL/uL (ref 3.87–5.11)
RDW: 14.8 % (ref 11.5–15.5)
WBC: 5.9 10*3/uL (ref 4.0–10.5)
nRBC: 0 % (ref 0.0–0.2)

## 2023-06-11 LAB — TROPONIN I (HIGH SENSITIVITY)
Troponin I (High Sensitivity): 10 ng/L (ref <18)
Troponin I (High Sensitivity): 13 ng/L (ref <18)

## 2023-06-11 NOTE — ED Triage Notes (Signed)
Pt BIB [facility] with chest pressure off and on x 2 weeks.  Reported that pt took 4 baby ASA prior to EMS arrival. Ems reports vitals-168/79, HR 81, 97% RA, CBG 145-wears home O2 PRN and at night.

## 2023-06-11 NOTE — Discharge Instructions (Addendum)
Test results today are reassuring.  You should follow-up with your cardiologist as soon as possible.  A referral has been ordered.  If you do not hear from them in the next couple days, call the telephone number below to set up that appointment.  Continue home medications as prescribed.  Return to the emergency department for any new or worsening symptoms of concern.

## 2023-06-11 NOTE — ED Provider Notes (Signed)
[place] EMERGENCY DEPARTMENT AT [facility] Provider Note   CSN: [id number] Arrival date & time: 06/11/23  1600     History  Chief Complaint  Patient presents with   Chest Pain    [name] is a 88 y.o. female.   Chest Pain Associated symptoms: palpitations   Patient presents for palpitations.  Medical history includes CHF, HTN, anxiety, CAD, COPD, HLD, GERD.  Her cardiologist is Dr. Eden Emms.  She had stents placed in 2018.  She had NM stress test 2 years ago which was intermediate risk.  She describes intermittent palpitations over the past several weeks.  She denies associated pain but will occasionally feel a pressure.  When she does, she takes baby aspirin and wears her as needed oxygen.  Episodes seem to resolve.  2 days ago, she had a fall at home.  She denies any suspected injuries from the fall.  Although she is not sure why she fell, she does remember the fall.  Today, she had an episode of transient lightheadedness.  She denies any symptoms currently.     Home Medications Prior to Admission medications   Medication Sig Start Date End Date Taking? Authorizing Provider  albuterol (VENTOLIN HFA) 108 (90 Base) MCG/ACT inhaler Inhale 2 puffs into the lungs every 6 (six) hours as needed for wheezing or shortness of breath. 11/20/22   Coralyn Helling, MD  alendronate (FOSAMAX) 70 MG tablet Take 1 tablet (70 mg total) by mouth every 7 (seven) days. Take with a full glass of water on an empty stomach. Do not lay down for at least 2 hours 10/22/22   Mechele Claude, MD  aspirin EC 81 MG tablet Take 81 mg by mouth every morning.     [provider]  carvedilol (COREG) 3.125 MG tablet Take 1 tablet (3.125 mg total) by mouth 2 (two) times daily with a meal. Patient taking differently: Take 3.125 mg by mouth daily as needed (high bp). 07/30/22   Wendall Stade, MD  Cholecalciferol (VITAMIN D) 50 MCG (2000 UT) tablet Take 1 tablet (2,000 Units total) by mouth daily.  10/22/22   Mechele Claude, MD  clorazepate (TRANXENE) 7.5 MG tablet Take 1 tablet (7.5 mg total) by mouth daily as needed for anxiety. 06/07/23   Mechele Claude, MD  dicyclomine (BENTYL) 10 MG capsule Take 1 capsule (10 mg total) by mouth every 8 (eight) hours as needed (abdominal pain). 03/03/23   Catarina Hartshorn, MD  fenofibrate (TRICOR) 48 MG tablet Take 1 tablet (48 mg total) by mouth daily. 03/06/23   St Vena Austria, NP  Fluticasone-Umeclidin-Vilant (TRELEGY ELLIPTA) 100-62.5-25 MCG/ACT AEPB Inhale 1 puff into the lungs daily in the afternoon. 11/20/22   Coralyn Helling, MD  isosorbide mononitrate (IMDUR) 30 MG 24 hr tablet Take 1 tablet (30 mg total) by mouth daily. 03/13/23 06/11/23  Strader, Lennart Pall, PA-C  levofloxacin (LEVAQUIN) 500 MG tablet Take 1 tablet (500 mg total) by mouth daily. 05/01/23   Mechele Claude, MD  linaclotide [facility]) 145 MCG CAPS capsule Take 1 capsule (145 mcg total) by mouth daily. To regulate bowel movements 05/01/23   Mechele Claude, MD  ondansetron (ZOFRAN) 4 MG tablet Take 1 tablet (4 mg total) by mouth every 8 (eight) hours as needed for nausea or vomiting. 03/12/23   Jannifer Rodney A, FNP  OXYGEN Inhale 2 L into the lungs at bedtime. Can use in the morning as needed    [provider]  pantoprazole (PROTONIX) 40 MG  tablet Take 1 tablet (40 mg total) by mouth daily. 03/04/23   Catarina Hartshorn, MD  rosuvastatin (CRESTOR) 20 MG tablet Take 1 tablet (20 mg total) by mouth daily. 11/13/22   Strader, Lennart Pall, PA-C  sacubitril-valsartan (ENTRESTO) 24-26 MG TAKE 1 TABLET BY MOUTH TWICE DAILY. 01/22/23   Wendall Stade, MD  simethicone (GAS-X EXTRA STRENGTH) 125 MG chewable tablet Chew 1 tablet (125 mg total) by mouth 3 (three) times daily before meals. 05/13/23 06/12/23  Mechele Claude, MD  Sod Picosulfate-Mag Ox-Cit Acd (CLENPIQ) 10-3.5-12 MG-GM -GM/175ML SOLN Take 1 kit by mouth as directed. 03/21/23   Dolores Frame, MD  traMADol (ULTRAM) 50 MG tablet  Take 1 tablet (50 mg total) by mouth every 12 (twelve) hours as needed for moderate pain (pain score 4-6). 05/21/23   Mechele Claude, MD  triamterene-hydrochlorothiazide (MAXZIDE-25) 37.5-25 MG tablet Take 1 tablet by mouth daily. For blood pressure and fluid Patient taking differently: Take 1 tablet by mouth daily as needed (blood pressure and fluid). 04/03/22   Mechele Claude, MD      Allergies    Plavix [clopidogrel bisulfate], Calcium channel blockers, Alprazolam, Codeine, Percodan [oxycodone-aspirin], Statins, and Valium    Review of Systems   Review of Systems  Cardiovascular:  Positive for palpitations.  Neurological:  Positive for light-headedness.  All other systems reviewed and are negative.   Physical Exam Updated Vital Signs BP (!) 164/58 (BP Location: Left Arm)   Pulse 79   Temp 98.5 F (36.9 C) (Oral)   Resp 20   Ht 5\' 1"  (1.549 m)   Wt 72.1 kg   SpO2 98%   BMI 30.04 kg/m  Physical Exam Vitals and nursing note reviewed.  Constitutional:      General: She is not in acute distress.    Appearance: She is well-developed. She is not ill-appearing, toxic-appearing or diaphoretic.  HENT:     Head: Normocephalic and atraumatic.  Eyes:     Conjunctiva/sclera: Conjunctivae normal.  Cardiovascular:     Rate and Rhythm: Normal rate and regular rhythm.     Heart sounds: No murmur heard. Pulmonary:     Effort: Pulmonary effort is normal. No respiratory distress.     Breath sounds: Normal breath sounds. No decreased breath sounds, wheezing, rhonchi or rales.  Chest:     Chest wall: No tenderness.  Abdominal:     Palpations: Abdomen is soft.     Tenderness: There is no abdominal tenderness.  Musculoskeletal:        General: No swelling. Normal range of motion.     Cervical back: Normal range of motion and neck supple.  Skin:    General: Skin is warm and dry.     Capillary Refill: Capillary refill takes less than 2 seconds.     Coloration: Skin is not cyanotic or pale.   Neurological:     General: No focal deficit present.     Mental Status: She is alert and oriented to person, place, and time.  Psychiatric:        Mood and Affect: Mood normal.        Behavior: Behavior normal.     ED Results / Procedures / Treatments   Labs (all labs ordered are listed, but only abnormal results are displayed) Labs Reviewed  BASIC METABOLIC PANEL - Abnormal; Notable for the following components:      Result Value   Glucose, Bld 135 (*)    All other components within normal limits  CBC  TROPONIN I (HIGH SENSITIVITY)  TROPONIN I (HIGH SENSITIVITY)    EKG EKG Interpretation Date/Time:  Tuesday June 11 2023 16:22:36 EST Ventricular Rate:  81 PR Interval:  164 QRS Duration:  160 QT Interval:  432 QTC Calculation: 501 R Axis:   3  Text Interpretation: Sinus rhythm with frequent Premature ventricular complexes and Premature atrial complexes Left bundle branch block Abnormal ECG Confirmed by Gloris Manchester [phone]) on 06/11/2023 4:46:57 PM  Radiology DG Chest 2 View Result Date: 06/11/2023 CLINICAL DATA:  Chest pressure. EXAM: CHEST - 2 VIEW COMPARISON:  02/06/2022 FINDINGS: Stable heart size and mediastinal contours, borderline cardiomegaly. No acute airspace disease. No pulmonary edema or pleural effusion. No pneumothorax. No acute osseous findings. Left humeral arthroplasty. IMPRESSION: No acute findings or explanation for symptoms. Electronically Signed   By: Narda Rutherford M.D.   On: 06/11/2023 17:00    Procedures Procedures    Medications Ordered in ED Medications - No data to display  ED Course/ Medical Decision Making/ A&P                                 Medical Decision Making Amount and/or Complexity of Data Reviewed Labs: ordered. Radiology: ordered.   Patient presenting for intermittent palpitations over the past several weeks.  These do occur daily.  Today, she had an episode of lightheadedness which was short in duration.  She is  currently asymptomatic.  Workup, initiated prior to patient being bedded in the ED is reassuring.  Patient has normal electrolytes and normal troponins x 2.  On assessment, she is well-appearing.  She is in normal sinus rhythm on monitor.  She will occasionally have PVCs.  Given what she describes as a near syncopal episode earlier today, patient was offered admission for cardiac monitoring.  She prefers to follow-up with her cardiologist in the office for consideration of wearable monitor.  I feel this is reasonable.  Cardiology referral was ordered.  Patient was discharged in stable condition.        Final Clinical Impression(s) / ED Diagnoses Final diagnoses:  Palpitations    Rx / DC Orders ED Discharge Orders          Ordered    Ambulatory referral to Cardiology       Comments: If you have not heard from the Cardiology office within the next 72 hours please call [phone].   06/11/23 2149              Gloris Manchester, MD 06/11/23 2149

## 2023-06-12 ENCOUNTER — Telehealth: Payer: Self-pay

## 2023-06-12 NOTE — Transitions of Care (Post Inpatient/ED Visit) (Signed)
06/12/2023  Name: [name] MRN: [id number] DOB: 1936-01-03  Today's TOC FU Call Status: Today's TOC FU Call Status:: Successful TOC FU Call Completed TOC FU Call Complete Date: 06/12/23 Patient's Name and Date of Birth confirmed.  Transition Care Management Follow-up Telephone Call Date of Discharge: 06/11/23 [name] Type of Discharge: Emergency Department Reason for ED Visit: Other: (palpitations) How have you been since you were released from the hospital?: Better Any questions or concerns?: No  Items Reviewed: Did you receive and understand the discharge instructions provided?: Yes Medications obtained,verified, and reconciled?: Yes (Medications Reviewed) Any new allergies since your discharge?: No Dietary orders reviewed?: Yes Do you have support at home?: No  Medications Reviewed Today: Medications Reviewed Today     Reviewed by Karena Addison, LPN (Licensed Practical Nurse) on 06/12/23 at 1218  Med List Status: <None>   Medication Order Taking? Sig Documenting Provider Last Dose Status Informant  albuterol (VENTOLIN HFA) 108 (90 Base) MCG/ACT inhaler [id number] No Inhale 2 puffs into the lungs every 6 (six) hours as needed for wheezing or shortness of breath. Coralyn Helling, MD Taking Active Self  alendronate (FOSAMAX) 70 MG tablet [id number] No Take 1 tablet (70 mg total) by mouth every 7 (seven) days. Take with a full glass of water on an empty stomach. Do not lay down for at least 2 hours Mechele Claude, MD Taking Active Self  aspirin EC 81 MG tablet [id number] No Take 81 mg by mouth every morning.  [provider] Taking Active Self           Med Note Elesa Massed, Dava Najjar Feb 28, 2023  2:11 PM)    carvedilol (COREG) 3.125 MG tablet [id number] No Take 1 tablet (3.125 mg total) by mouth 2 (two) times daily with a meal.  Patient taking differently: Take 3.125 mg by mouth daily as needed (high bp).   Wendall Stade, MD Taking  Active Self  Cholecalciferol (VITAMIN D) 50 MCG (2000 UT) tablet [id number] No Take 1 tablet (2,000 Units total) by mouth daily. Mechele Claude, MD Taking Active Self  clorazepate (TRANXENE) 7.5 MG tablet [id number]  Take 1 tablet (7.5 mg total) by mouth daily as needed for anxiety. Mechele Claude, MD  Active   dicyclomine (BENTYL) 10 MG capsule [id number] No Take 1 capsule (10 mg total) by mouth every 8 (eight) hours as needed (abdominal pain). Catarina Hartshorn, MD Taking Active   fenofibrate (TRICOR) 48 MG tablet [id number] No Take 1 tablet (48 mg total) by mouth daily. St [place] Lighter, Dois Davenport, NP Taking Active   Fluticasone-Umeclidin-Vilant (TRELEGY ELLIPTA) 100-62.5-25 MCG/ACT AEPB [id number] No Inhale 1 puff into the lungs daily in the afternoon. Coralyn Helling, MD Taking Active Self  isosorbide mononitrate (IMDUR) 30 MG 24 hr tablet [id number] No Take 1 tablet (30 mg total) by mouth daily. Ellsworth Lennox, PA-C Taking Expired 06/11/23 2359   levofloxacin (LEVAQUIN) 500 MG tablet [id number]  Take 1 tablet (500 mg total) by mouth daily. Mechele Claude, MD  Active   linaclotide [facility]) 145 MCG CAPS capsule [id number]  Take 1 capsule (145 mcg total) by mouth daily. To regulate bowel movements Mechele Claude, MD  Active   ondansetron (ZOFRAN) 4 MG tablet [id number] No Take 1 tablet (4 mg total) by mouth every 8 (eight) hours as needed for nausea or vomiting. Junie Spencer, FNP Taking Active   OXYGEN [id number] No Inhale 2 L into the lungs at bedtime. Can use in the  morning as needed [provider] Taking Active Self, Pharmacy Records           Med Note [facility], CARLOS A   Fri Aug 25, 2021 10:48 AM)    pantoprazole (PROTONIX) 40 MG tablet [id number] No Take 1 tablet (40 mg total) by mouth daily. Catarina Hartshorn, MD Taking Active   rosuvastatin (CRESTOR) 20 MG tablet [id number] No Take 1 tablet (20 mg total) by mouth daily. Ellsworth Lennox, PA-C Taking Active Self  sacubitril-valsartan  (ENTRESTO) 24-26 MG [id number] No TAKE 1 TABLET BY MOUTH TWICE DAILY. Wendall Stade, MD Taking Active Self  simethicone (GAS-X EXTRA STRENGTH) 125 MG chewable tablet [id number]  Chew 1 tablet (125 mg total) by mouth 3 (three) times daily before meals. Mechele Claude, MD  Active   Sod Picosulfate-Mag Ox-Cit Acd [facility]) 10-3.5-12 MG-GM -GM/175ML SOLN [id number] No Take 1 kit by mouth as directed. Marguerita Merles, Reuel Boom, MD Taking Active   traMADol Jane[facility] Sark) 50 MG tablet [id number]  Take 1 tablet (50 mg total) by mouth every 12 (twelve) hours as needed for moderate pain (pain score 4-6). Mechele Claude, MD  Active   triamterene-hydrochlorothiazide [facility]) 37.5-25 MG tablet [id number] No Take 1 tablet by mouth daily. For blood pressure and fluid  Patient taking differently: Take 1 tablet by mouth daily as needed (blood pressure and fluid).   Mechele Claude, MD Taking Active Self            Home Care and Equipment/Supplies: Were Home Health Services Ordered?: NA Any new equipment or medical supplies ordered?: NA  Functional Questionnaire: Do you need assistance with bathing/showering or dressing?: No Do you need assistance with meal preparation?: No Do you need assistance with eating?: No Do you have difficulty maintaining continence: No Do you need assistance with getting out of bed/getting out of a chair/moving?: No Do you have difficulty managing or taking your medications?: No  Follow up appointments reviewed: PCP Follow-up appointment confirmed?: Yes Date of PCP follow-up appointment?: 06/25/23 Follow-up Provider: [facility] Follow-up appointment confirmed?: NA Do you need transportation to your follow-up appointment?: No Do you understand care options if your condition(s) worsen?: Yes-patient verbalized understanding    SIGNATURE Karena Addison, LPN [facility] Nurse Health Advisor Direct Dial [phone]

## 2023-06-13 ENCOUNTER — Ambulatory Visit: Payer: 59 | Admitting: Family Medicine

## 2023-06-25 ENCOUNTER — Ambulatory Visit (INDEPENDENT_AMBULATORY_CARE_PROVIDER_SITE_OTHER): Payer: 59 | Admitting: Family Medicine

## 2023-06-25 VITALS — BP 132/67 | HR 94 | Temp 97.1°F | Ht 61.0 in | Wt 155.0 lb

## 2023-06-25 DIAGNOSIS — K5792 Diverticulitis of intestine, part unspecified, without perforation or abscess without bleeding: Secondary | ICD-10-CM | POA: Diagnosis not present

## 2023-06-25 DIAGNOSIS — M0609 Rheumatoid arthritis without rheumatoid factor, multiple sites: Secondary | ICD-10-CM

## 2023-06-25 MED ORDER — ALENDRONATE SODIUM 70 MG PO TABS: 70.0000 mg | ORAL_TABLET | ORAL | 11 refills | Status: AC

## 2023-06-25 MED ORDER — CETIRIZINE HCL 10 MG PO TABS: 10.0000 mg | ORAL_TABLET | Freq: Every day | ORAL | 5 refills | Status: AC

## 2023-06-25 NOTE — Progress Notes (Unsigned)
Subjective:  Patient ID: [name], female    DOB: 04-09-1936  Age: 88 y.o. MRN: [id number]  CC: Diverticulitis (Still having sore pain constantly. Solid foods cause extreme pain. Mostly loose stools. ), itching (Itching all over for 2 or 3 weeks. ), and Fall (Pt had a fall last week while walking walking in the dark. Had to have EMT to come get her up. Knees are still sore from fall. No head injury. No rugs. )   HPI Stephany Poorman Mailhot presents for lupus of the skin. Itching all over.  Passes out last week. Appt. To see GI coming up. Still getting daily right flank pain. Hurts pretty bad. Occurring daily. Linzess helps.      05/01/2023    2:43 PM 04/23/2023    3:25 PM 04/22/2023   10:53 AM  Depression screen PHQ 2/9  Decreased Interest 0 3 3  Down, Depressed, Hopeless 0 2 2  PHQ - 2 Score 0 5 5  Altered sleeping  1 1  Tired, decreased energy  3 3  Change in appetite  3 3  Feeling bad or failure about yourself   1 1  Trouble concentrating  3 3  Moving slowly or fidgety/restless  1 1  Suicidal thoughts  0 0  PHQ-9 Score  17 17  Difficult doing work/chores   Very difficult    History Mallissa has a past medical history of Acute on chronic respiratory failure with hypoxia (HCC) (04/24/2018), Anxiety, Atypical chest pain, Bronchospasm, CAD (coronary artery disease), Cervical disc disorder with myelopathy, unspecified cervical region, Cervicalgia (01/19/2009), Chest pain at rest (01/27/2015), Chronic systolic (congestive) heart failure (HCC), COPD (chronic obstructive pulmonary disease) (HCC), Coronary artery disease, De Quervain's disease (tenosynovitis) (04/02/2011), Disc disease with myelopathy, cervical, Essential hypertension, Hemorrhoids, Liver mass, Lung, cysts, congenital, Myocardial infarction (HCC), Nephrolithiasis, Nonischemic cardiomyopathy (HCC), On home O2, Osteoarthritis, Sprain of wrist (08/07/2012), and Thoracic ascending aortic aneurysm (HCC).   She has a past surgical history  that includes Tonsillectomy and adenoidectomy; Complete hysterectomy; Benign breast tumors; Cholecystectomy; Colonoscopy; Colonoscopy (N/A, 09/22/2014); LEFT HEART CATH AND CORONARY ANGIOGRAPHY (N/A, 11/23/2016); CORONARY STENT INTERVENTION (N/A, 11/23/2016); Colonoscopy with propofol (N/A, 04/16/2023); and Polypectomy (04/16/2023).   Her family history includes Aneurysm in her father; Diabetes in her brother; Heart disease in her brother, mother, and sister; Lung cancer in her brother.She reports that she quit smoking about 15 years ago. Her smoking use included cigarettes. She started smoking about 46 years ago. She has a 7.8 pack-year smoking history. She has never used smokeless tobacco. She reports that she does not drink alcohol and does not use drugs.    ROS Review of Systems  Objective:  BP 132/67   Pulse 94   Temp (!) 97.1 F (36.2 C)   Ht 5\' 1"  (1.549 m)   Wt 155 lb (70.3 kg)   SpO2 96%   BMI 29.29 kg/m   BP Readings from Last 3 Encounters:  06/25/23 132/67  06/11/23 (!) 129/47  05/01/23 126/66    Wt Readings from Last 3 Encounters:  06/25/23 155 lb (70.3 kg)  06/11/23 159 lb (72.1 kg)  05/01/23 159 lb (72.1 kg)     Physical Exam    Assessment & Plan:   There are no diagnoses linked to this encounter.     I am having Ronnette Hila. Allums maintain her aspirin EC, OXYGEN, triamterene-hydrochlorothiazide, carvedilol, alendronate, Vitamin D, rosuvastatin, albuterol, Trelegy Ellipta, Entresto, pantoprazole, dicyclomine, fenofibrate, ondansetron, isosorbide mononitrate, Clenpiq, levofloxacin, linaclotide, simethicone,  traMADol, and clorazepate.  Allergies as of 06/25/2023       Reactions   Plavix [clopidogrel Bisulfate] Itching   Severe itching   Calcium Channel Blockers Other (See Comments)   cramping   Alprazolam Nausea And Vomiting   Codeine Nausea And Vomiting   Percodan [oxycodone-aspirin] Nausea And Vomiting   Statins Other (See Comments)   Muscle cramps    Valium Nausea And Vomiting        Medication List        Accurate as of June 25, 2023  2:34 PM. If you have any questions, ask your nurse or doctor.          albuterol 108 (90 Base) MCG/ACT inhaler Commonly known as: Ventolin HFA Inhale 2 puffs into the lungs every 6 (six) hours as needed for wheezing or shortness of breath.   alendronate 70 MG tablet Commonly known as: FOSAMAX Take 1 tablet (70 mg total) by mouth every 7 (seven) days. Take with a full glass of water on an empty stomach. Do not lay down for at least 2 hours   aspirin EC 81 MG tablet Take 81 mg by mouth every morning.   carvedilol 3.125 MG tablet Commonly known as: Coreg Take 1 tablet (3.125 mg total) by mouth 2 (two) times daily with a meal. What changed:  when to take this reasons to take this   Clenpiq 10-3.5-12 MG-GM -GM/175ML Soln Generic drug: Sod Picosulfate-Mag Ox-Cit Acd Take 1 kit by mouth as directed.   clorazepate 7.5 MG tablet Commonly known as: TRANXENE Take 1 tablet (7.5 mg total) by mouth daily as needed for anxiety.   dicyclomine 10 MG capsule Commonly known as: BENTYL Take 1 capsule (10 mg total) by mouth every 8 (eight) hours as needed (abdominal pain).   Entresto 24-26 MG Generic drug: sacubitril-valsartan TAKE 1 TABLET BY MOUTH TWICE DAILY.   fenofibrate 48 MG tablet Commonly known as: Tricor Take 1 tablet (48 mg total) by mouth daily.   isosorbide mononitrate 30 MG 24 hr tablet Commonly known as: IMDUR Take 1 tablet (30 mg total) by mouth daily.   levofloxacin 500 MG tablet Commonly known as: LEVAQUIN Take 1 tablet (500 mg total) by mouth daily.   linaclotide 145 MCG Caps capsule Commonly known as: Linzess Take 1 capsule (145 mcg total) by mouth daily. To regulate bowel movements   ondansetron 4 MG tablet Commonly known as: Zofran Take 1 tablet (4 mg total) by mouth every 8 (eight) hours as needed for nausea or vomiting.   OXYGEN Inhale 2 L into the lungs  at bedtime. Can use in the morning as needed   pantoprazole 40 MG tablet Commonly known as: PROTONIX Take 1 tablet (40 mg total) by mouth daily.   rosuvastatin 20 MG tablet Commonly known as: Crestor Take 1 tablet (20 mg total) by mouth daily.   simethicone 125 MG chewable tablet Commonly known as: Gas-X Extra Strength Chew 1 tablet (125 mg total) by mouth 3 (three) times daily before meals.   traMADol 50 MG tablet Commonly known as: ULTRAM Take 1 tablet (50 mg total) by mouth every 12 (twelve) hours as needed for moderate pain (pain score 4-6).   Trelegy Ellipta 100-62.5-25 MCG/ACT Aepb Generic drug: Fluticasone-Umeclidin-Vilant Inhale 1 puff into the lungs daily in the afternoon.   triamterene-hydrochlorothiazide 37.5-25 MG tablet Commonly known as: MAXZIDE-25 Take 1 tablet by mouth daily. For blood pressure and fluid What changed:  when to take this reasons to take this additional  instructions   Vitamin D 50 MCG (2000 UT) tablet Take 1 tablet (2,000 Units total) by mouth daily.         Follow-up: No follow-ups on file.  Mechele Claude, M.D.

## 2023-06-28 ENCOUNTER — Telehealth: Payer: Self-pay | Admitting: Cardiovascular Disease

## 2023-06-28 NOTE — Telephone Encounter (Signed)
Patient notified and verbalized understanding. Patient had no further questions

## 2023-06-28 NOTE — Telephone Encounter (Signed)
  Patient wants to know if there is such a thing has mini heart attacks. She states she was told this at the ED.

## 2023-07-03 ENCOUNTER — Ambulatory Visit: Payer: 59 | Admitting: Family Medicine

## 2023-07-11 ENCOUNTER — Encounter: Payer: Self-pay | Admitting: Family Medicine

## 2023-07-11 ENCOUNTER — Ambulatory Visit (INDEPENDENT_AMBULATORY_CARE_PROVIDER_SITE_OTHER): Payer: 59 | Admitting: Family Medicine

## 2023-07-11 VITALS — BP 137/72 | HR 79 | Temp 97.9°F | Ht 61.0 in | Wt 157.0 lb

## 2023-07-11 DIAGNOSIS — R4189 Other symptoms and signs involving cognitive functions and awareness: Secondary | ICD-10-CM | POA: Diagnosis not present

## 2023-07-11 DIAGNOSIS — I5042 Chronic combined systolic (congestive) and diastolic (congestive) heart failure: Secondary | ICD-10-CM

## 2023-07-11 DIAGNOSIS — J432 Centrilobular emphysema: Secondary | ICD-10-CM

## 2023-07-11 DIAGNOSIS — I5022 Chronic systolic (congestive) heart failure: Secondary | ICD-10-CM

## 2023-07-11 DIAGNOSIS — Z9861 Coronary angioplasty status: Secondary | ICD-10-CM

## 2023-07-11 DIAGNOSIS — I2510 Atherosclerotic heart disease of native coronary artery without angina pectoris: Secondary | ICD-10-CM | POA: Diagnosis not present

## 2023-07-11 NOTE — Progress Notes (Signed)
 Subjective:  Patient ID: [name], female    DOB: 1936/02/20  Age: 88 y.o. MRN: [id number]  CC: Home Health (Told to bring papers and fill out.  Needs orders for home health due to fall. ) and Arm Pain (Unable to left arm. Claims its been broke for 12 years. Causes pain when lifting or using. )   HPI [name] presents for recent fall. She is unsteady on her feet. She struggles to perform ADLs  Decreased use of LUE. Flexion, abduction limited to 30-45 degrees     07/11/2023    3:37 PM 05/01/2023    2:43 PM 04/23/2023    3:25 PM  Depression screen PHQ 2/9  Decreased Interest 1 0 3  Down, Depressed, Hopeless 1 0 2  PHQ - 2 Score 2 0 5  Altered sleeping 1  1  Tired, decreased energy 3  3  Change in appetite 1  3  Feeling bad or failure about yourself  1  1  Trouble concentrating 3  3  Moving slowly or fidgety/restless 0  1  Suicidal thoughts 0  0  PHQ-9 Score 11  17  Difficult doing work/chores Not difficult at all      History [name] has a past medical history of Acute on chronic respiratory failure with hypoxia (HCC) (04/24/2018), Anxiety, Atypical chest pain, Bronchospasm, CAD (coronary artery disease), Cervical disc disorder with myelopathy, unspecified cervical region, Cervicalgia (01/19/2009), Chest pain at rest (01/27/2015), Chronic systolic (congestive) heart failure (HCC), COPD (chronic obstructive pulmonary disease) (HCC), Coronary artery disease, De Quervain's disease (tenosynovitis) (04/02/2011), Disc disease with myelopathy, cervical, Essential hypertension, Hemorrhoids, Liver mass, Lung, cysts, congenital, Myocardial infarction (HCC), Nephrolithiasis, Nonischemic cardiomyopathy (HCC), On home O2, Osteoarthritis, Sprain of wrist (08/07/2012), and Thoracic ascending aortic aneurysm (HCC).   She has a past surgical history that includes Tonsillectomy and adenoidectomy; Complete hysterectomy; Benign breast tumors; Cholecystectomy; Colonoscopy; Colonoscopy (N/A,  09/22/2014); LEFT HEART CATH AND CORONARY ANGIOGRAPHY (N/A, 11/23/2016); CORONARY STENT INTERVENTION (N/A, 11/23/2016); Colonoscopy with propofol (N/A, 04/16/2023); and Polypectomy (04/16/2023).   Her family history includes Aneurysm in her father; Diabetes in her brother; Heart disease in her brother, mother, and sister; Lung cancer in her brother.She reports that she quit smoking about 15 years ago. Her smoking use included cigarettes. She started smoking about 46 years ago. She has a 7.8 pack-year smoking history. She has never used smokeless tobacco. She reports that she does not drink alcohol and does not use drugs.    ROS Review of Systems  Constitutional: Negative.   HENT: Negative.    Eyes:  Negative for visual disturbance.  Respiratory:  Negative for shortness of breath.   Cardiovascular:  Negative for chest pain.  Gastrointestinal:  Negative for abdominal pain.  Musculoskeletal:  Positive for arthralgias (RUE) and gait problem (imbalance).    Objective:  BP 137/72   Pulse 79   Temp 97.9 F (36.6 C)   Ht 5\' 1"  (1.549 m)   Wt 157 lb (71.2 kg)   SpO2 95%   BMI 29.66 kg/m   BP Readings from Last 3 Encounters:  07/11/23 137/72  06/25/23 132/67  06/11/23 (!) 129/47    Wt Readings from Last 3 Encounters:  07/11/23 157 lb (71.2 kg)  06/25/23 155 lb (70.3 kg)  06/11/23 159 lb (72.1 kg)     Physical Exam Constitutional:      General: She is not in acute distress.    Appearance: She is well-developed.  Cardiovascular:  Rate and Rhythm: Normal rate and regular rhythm.  Pulmonary:     Breath sounds: Normal breath sounds.  Musculoskeletal:        General: Normal range of motion.     Comments: Gait wide based, unsteady, antalgic   Skin:    General: Skin is warm and dry.  Neurological:     Mental Status: She is alert and oriented to person, place, and time.       Assessment & Plan:   [name] was seen today for home health and arm pain.  Diagnoses and all orders  for this visit:  Coronary artery disease involving native coronary artery of native heart without angina pectoris -     Ambulatory referral to Home Health  CAD S/P percutaneous coronary angioplasty -     Ambulatory referral to Home Health  Chronic combined systolic and diastolic CHF (congestive heart failure) (HCC) -     Ambulatory referral to Home Health  Chronic HFrEF (heart failure with reduced ejection fraction) (HCC) -     Ambulatory referral to Home Health  Cognitive impairment -     Ambulatory referral to Home Health  Centrilobular emphysema (HCC) -     Ambulatory referral to Home Health       I am having [name] Hila. [name] maintain her aspirin EC, OXYGEN, triamterene-hydrochlorothiazide, carvedilol, Vitamin D, rosuvastatin, albuterol, Trelegy Ellipta, Entresto, pantoprazole, dicyclomine, fenofibrate, ondansetron, isosorbide mononitrate, linaclotide, simethicone, traMADol, clorazepate, alendronate, and cetirizine.  Allergies as of 07/11/2023       Reactions   Plavix [clopidogrel Bisulfate] Itching   Severe itching   Calcium Channel Blockers Other (See Comments)   cramping   Alprazolam Nausea And Vomiting   Codeine Nausea And Vomiting   Percodan [oxycodone-aspirin] Nausea And Vomiting   Statins Other (See Comments)   Muscle cramps   Valium Nausea And Vomiting        Medication List        Accurate as of July 11, 2023 11:59 PM. If you have any questions, ask your nurse or doctor.          albuterol 108 (90 Base) MCG/ACT inhaler Commonly known as: Ventolin HFA Inhale 2 puffs into the lungs every 6 (six) hours as needed for wheezing or shortness of breath.   alendronate 70 MG tablet Commonly known as: FOSAMAX Take 1 tablet (70 mg total) by mouth every 7 (seven) days. Take with a full glass of water on an empty stomach. Do not lay down for at least 2 hours   aspirin EC 81 MG tablet Take 81 mg by mouth every morning.   carvedilol 3.125 MG  tablet Commonly known as: Coreg Take 1 tablet (3.125 mg total) by mouth 2 (two) times daily with a meal. What changed:  when to take this reasons to take this   cetirizine 10 MG tablet Commonly known as: ZyrTEC Allergy Take 1 tablet (10 mg total) by mouth daily. For itching   clorazepate 7.5 MG tablet Commonly known as: TRANXENE Take 1 tablet (7.5 mg total) by mouth daily as needed for anxiety.   dicyclomine 10 MG capsule Commonly known as: BENTYL Take 1 capsule (10 mg total) by mouth every 8 (eight) hours as needed (abdominal pain).   Entresto 24-26 MG Generic drug: sacubitril-valsartan TAKE 1 TABLET BY MOUTH TWICE DAILY.   fenofibrate 48 MG tablet Commonly known as: Tricor Take 1 tablet (48 mg total) by mouth daily.   isosorbide mononitrate 30 MG 24 hr tablet Commonly known as:  IMDUR Take 1 tablet (30 mg total) by mouth daily.   linaclotide 145 MCG Caps capsule Commonly known as: Linzess Take 1 capsule (145 mcg total) by mouth daily. To regulate bowel movements   ondansetron 4 MG tablet Commonly known as: Zofran Take 1 tablet (4 mg total) by mouth every 8 (eight) hours as needed for nausea or vomiting.   OXYGEN Inhale 2 L into the lungs at bedtime. Can use in the morning as needed   pantoprazole 40 MG tablet Commonly known as: PROTONIX Take 1 tablet (40 mg total) by mouth daily.   rosuvastatin 20 MG tablet Commonly known as: Crestor Take 1 tablet (20 mg total) by mouth daily.   simethicone 125 MG chewable tablet Commonly known as: Gas-X Extra Strength Chew 1 tablet (125 mg total) by mouth 3 (three) times daily before meals.   traMADol 50 MG tablet Commonly known as: ULTRAM Take 1 tablet (50 mg total) by mouth every 12 (twelve) hours as needed for moderate pain (pain score 4-6).   Trelegy Ellipta 100-62.5-25 MCG/ACT Aepb Generic drug: Fluticasone-Umeclidin-Vilant Inhale 1 puff into the lungs daily in the afternoon.   triamterene-hydrochlorothiazide  37.5-25 MG tablet Commonly known as: MAXZIDE-25 Take 1 tablet by mouth daily. For blood pressure and fluid What changed:  when to take this reasons to take this additional instructions   Vitamin D 50 MCG (2000 UT) tablet Take 1 tablet (2,000 Units total) by mouth daily.         Follow-up: Return in about 1 month (around 08/08/2023).  Mechele Claude, M.D.

## 2023-07-13 ENCOUNTER — Encounter: Payer: Self-pay | Admitting: Family Medicine

## 2023-07-18 ENCOUNTER — Ambulatory Visit (INDEPENDENT_AMBULATORY_CARE_PROVIDER_SITE_OTHER): Payer: 59 | Admitting: Gastroenterology

## 2023-07-18 ENCOUNTER — Encounter (INDEPENDENT_AMBULATORY_CARE_PROVIDER_SITE_OTHER): Payer: Self-pay | Admitting: Gastroenterology

## 2023-07-18 ENCOUNTER — Telehealth: Payer: Self-pay | Admitting: Family Medicine

## 2023-07-18 VITALS — BP 148/73 | HR 78 | Temp 98.6°F | Ht 61.0 in | Wt 163.6 lb

## 2023-07-18 DIAGNOSIS — R1031 Right lower quadrant pain: Secondary | ICD-10-CM | POA: Diagnosis not present

## 2023-07-18 MED ORDER — DICYCLOMINE HCL 10 MG PO CAPS: 10.0000 mg | ORAL_CAPSULE | Freq: Two times a day (BID) | ORAL | 1 refills | Status: DC | PRN

## 2023-07-18 NOTE — Progress Notes (Signed)
 [name], M.D. Gastroenterology & Hepatology [facility] [address] [place], [place] [address]  Primary Care Physician: Mechele Claude, MD [address] [place] [address]  I will communicate my assessment and recommendations to the referring MD via EMR.  Problems: Chronic right lower quadrant abdominal pain   History of Present Illness: [name] is a 88 y.o. female with history of anxiety, coronary artery disease disease status post stent placement, COPD, CHF, hypertension, hyperlipidemia,who presents for follow up of abdominal pain.  The patient was last seen on 03/21/2023. At that time, the patient was scheduled for colonoscopy and was continued with Bentyl for pain control.  Patient reports that she is still having issues with abdominal pain. She states she reports that most of the pain is located on the right side of the abdomen, usually when she has solid food. Has not felt any better compared to prior. She does not have too many issues with liquid food, tries to drink ensure frequently. She reports that she is passing flatulence frequently. Occasionally has nausea but no vomiting.  States she was taking Bentyl and felt it helped, but she ran out of the medication.  The patient denies having any fever, chills, hematochezia, melena, hematemesis, abdominal distention,diarrhea, jaundice, pruritus . Has lost 9 lb for the last 6 months.  Most recent labs on 06/11/2023 showed normal BMP, CBC with hemoglobin 12.1, WBC 5.9 and platelets 315.  Last Colonoscopy: 04/16/2023 - Six 2 to 12 mm polyps in the transverse colon, in the ascending colon, in the cecum and at the ileocecal valve, removed with a cold snare. Resected and retrieved. - One 1 mm polyp in the ascending colon, removed with a cold biopsy forceps. Resected and retrieved. - Diverticulosis in the entire examined colon. - Non- bleeding internal hemorrhoids.  All polyps  were tubular adenomas.  No repeat colonoscopy given patient's age.  Past Medical History: Past Medical History:  Diagnosis Date   Acute on chronic respiratory failure with hypoxia (HCC) 04/24/2018   Anxiety    Atypical chest pain    Bronchospasm    CAD (coronary artery disease)    a. s/p DES to mid-LAD and DES to mid-OM1 in 11/2016   Cervical disc disorder with myelopathy, unspecified cervical region    Cervicalgia 01/19/2009   Qualifier: Diagnosis of  By: Romeo Apple MD, Stanley     Chest pain at rest 01/27/2015   Chronic systolic (congestive) heart failure (HCC)    COPD (chronic obstructive pulmonary disease) (HCC)    Coronary artery disease    De Quervain's disease (tenosynovitis) 04/02/2011   Disc disease with myelopathy, cervical    Essential hypertension    Hemorrhoids    Liver mass    Lung, cysts, congenital    Left lung cyst   Myocardial infarction (HCC)    Nephrolithiasis    Embedded   Nonischemic cardiomyopathy (HCC)    LVEF 35-40% 2015   On home O2    Osteoarthritis    Sprain of wrist 08/07/2012   Thoracic ascending aortic aneurysm (HCC)    4.3 cm April 2016    Past Surgical History: Past Surgical History:  Procedure Laterality Date   Benign breast tumors     CHOLECYSTECTOMY     COLONOSCOPY     COLONOSCOPY N/A 09/22/2014   Procedure: COLONOSCOPY;  Surgeon: Malissa Hippo, MD;  [facility]: [facility];  Service: Endoscopy;  Laterality: N/A;  830 -- to be done in OR  under fluoro   COLONOSCOPY WITH PROPOFOL N/A 04/16/2023   Procedure: COLONOSCOPY WITH PROPOFOL;  Surgeon: Dolores Frame, MD;  Location: [facility];  Service: Gastroenterology;  Laterality: N/A;  11:00am, asa 3   Complete hysterectomy     CORONARY STENT INTERVENTION N/A 11/23/2016   Procedure: Coronary Stent Intervention;  Surgeon: Swaziland, Peter M, MD;  Location: [facility] INVASIVE CV LAB;  Service: Cardiovascular;  Laterality: N/A;   LEFT HEART CATH AND CORONARY ANGIOGRAPHY N/A 11/23/2016    Procedure: Left Heart Cath and Coronary Angiography;  Surgeon: Swaziland, Peter M, MD;  Location: [facility] INVASIVE CV LAB;  Service: Cardiovascular;  Laterality: N/A;   POLYPECTOMY  04/16/2023   Procedure: POLYPECTOMY INTESTINAL;  Surgeon: Dolores Frame, MD;  Location: [facility];  Service: Gastroenterology;;   TONSILLECTOMY AND ADENOIDECTOMY      Family History: Family History  Problem Relation Age of Onset   Heart disease Mother    Aneurysm Father    Lung cancer Brother    Heart disease Sister    Diabetes Brother    Heart disease Brother     Social History: Social History   Tobacco Use  Smoking Status Former   Current packs/day: 0.00   Average packs/day: 0.3 packs/day for 31.0 years (7.8 ttl pk-yrs)   Types: Cigarettes   Start date: 05/14/1977   Quit date: 05/28/2008   Years since quitting: 15.1  Smokeless Tobacco Never  Tobacco Comments   patient states she only smoked for 3 years total   Social History   Substance and Sexual Activity  Alcohol Use No   Alcohol/week: 0.0 standard drinks of alcohol   Social History   Substance and Sexual Activity  Drug Use No    Allergies: Allergies  Allergen Reactions   Plavix [Clopidogrel Bisulfate] Itching    Severe itching   Calcium Channel Blockers Other (See Comments)    cramping   Alprazolam Nausea And Vomiting   Codeine Nausea And Vomiting   Percodan [Oxycodone-Aspirin] Nausea And Vomiting   Statins Other (See Comments)    Muscle cramps   Valium Nausea And Vomiting    Medications: Current Outpatient Medications  Medication Sig Dispense Refill   albuterol (VENTOLIN HFA) 108 (90 Base) MCG/ACT inhaler Inhale 2 puffs into the lungs every 6 (six) hours as needed for wheezing or shortness of breath. 24 g 3   alendronate (FOSAMAX) 70 MG tablet Take 1 tablet (70 mg total) by mouth every 7 (seven) days. Take with a full glass of water on an empty stomach. Do not lay down for at least 2 hours 4 tablet 11   aspirin EC  81 MG tablet Take 81 mg by mouth every morning.      carvedilol (COREG) 3.125 MG tablet Take 1 tablet (3.125 mg total) by mouth 2 (two) times daily with a meal. (Patient taking differently: Take 3.125 mg by mouth daily as needed (high bp).) 60 tablet 11   cetirizine (ZYRTEC ALLERGY) 10 MG tablet Take 1 tablet (10 mg total) by mouth daily. For itching 30 tablet 5   Cholecalciferol (VITAMIN D) 50 MCG (2000 UT) tablet Take 1 tablet (2,000 Units total) by mouth daily. 90 tablet 3   clorazepate (TRANXENE) 7.5 MG tablet Take 1 tablet (7.5 mg total) by mouth daily as needed for anxiety. 30 tablet 5   dicyclomine (BENTYL) 10 MG capsule Take 1 capsule (10 mg total) by mouth every 8 (eight) hours as needed (abdominal pain). 60 capsule 1   fenofibrate (TRICOR)  48 MG tablet Take 1 tablet (48 mg total) by mouth daily. 90 tablet 0   Fluticasone-Umeclidin-Vilant (TRELEGY ELLIPTA) 100-62.5-25 MCG/ACT AEPB Inhale 1 puff into the lungs daily in the afternoon. 180 each 3   ondansetron (ZOFRAN) 4 MG tablet Take 1 tablet (4 mg total) by mouth every 8 (eight) hours as needed for nausea or vomiting. 20 tablet 0   OXYGEN Inhale 2 L into the lungs at bedtime. Can use in the morning as needed     sacubitril-valsartan (ENTRESTO) 24-26 MG TAKE 1 TABLET BY MOUTH TWICE DAILY. 60 tablet 5   traMADol (ULTRAM) 50 MG tablet Take 1 tablet (50 mg total) by mouth every 12 (twelve) hours as needed for moderate pain (pain score 4-6). 30 tablet 5   triamterene-hydrochlorothiazide (MAXZIDE-25) 37.5-25 MG tablet Take 1 tablet by mouth daily. For blood pressure and fluid (Patient taking differently: Take 1 tablet by mouth daily as needed (blood pressure and fluid).) 90 tablet 3   isosorbide mononitrate (IMDUR) 30 MG 24 hr tablet Take 1 tablet (30 mg total) by mouth daily.     linaclotide (LINZESS) 145 MCG CAPS capsule Take 1 capsule (145 mcg total) by mouth daily. To regulate bowel movements (Patient not taking: Reported on 07/18/2023) 30 capsule  5   pantoprazole (PROTONIX) 40 MG tablet Take 1 tablet (40 mg total) by mouth daily. 30 tablet 1   rosuvastatin (CRESTOR) 20 MG tablet Take 1 tablet (20 mg total) by mouth daily. (Patient not taking: Reported on 07/18/2023) 30 tablet 5   No current facility-administered medications for this visit.    Review of Systems: GENERAL: negative for malaise, night sweats HEENT: No changes in hearing or vision, no nose bleeds or other nasal problems. NECK: Negative for lumps, goiter, pain and significant neck swelling RESPIRATORY: Negative for cough, wheezing CARDIOVASCULAR: Negative for chest pain, leg swelling, palpitations, orthopnea GI: SEE HPI MUSCULOSKELETAL: Negative for joint pain or swelling, back pain, and muscle pain. SKIN: Negative for lesions, rash PSYCH: Negative for sleep disturbance, mood disorder and recent psychosocial stressors. HEMATOLOGY Negative for prolonged bleeding, bruising easily, and swollen nodes. ENDOCRINE: Negative for cold or heat intolerance, polyuria, polydipsia and goiter. NEURO: negative for tremor, gait imbalance, syncope and seizures. The remainder of the review of systems is noncontributory.   Physical Exam: BP (!) 148/73 (BP Location: Right Arm, Patient Position: Sitting, Cuff Size: Normal)   Pulse 78   Temp 98.6 F (37 C) (Oral)   Ht 5\' 1"  (1.549 m)   Wt 163 lb 9.6 oz (74.2 kg)   BMI 30.91 kg/m  GENERAL: The patient is AO x3, in no acute distress. HEENT: Head is normocephalic and atraumatic. EOMI are intact. Mouth is well hydrated and without lesions. NECK: Supple. No masses LUNGS: Clear to auscultation. No presence of rhonchi/wheezing/rales. Adequate chest expansion HEART: RRR, normal s1 and s2. ABDOMEN: tender to palpation in the RLQ, no guarding, no peritoneal signs, and nondistended. BS +. No masses. EXTREMITIES: Without any cyanosis, clubbing, rash, lesions or edema. NEUROLOGIC: AOx3, no focal motor deficit. SKIN: no jaundice, no  rashes  Imaging/Labs: as above  I personally reviewed and interpreted the available labs, imaging and endoscopic files.  Impression and Plan: Jonisha Kindig Rud is a 88 y.o. female with history of anxiety, coronary artery disease disease status post stent placement, COPD, CHF, hypertension, hyperlipidemia,who presents for follow up of abdominal pain.  The patient has presented persistent abdominal pain, which is primarily present after she has a solid meal.  Due  to being afraid of having solid meals, she has been mostly eating liquids and this has led to weight loss.  She has had previous cross-sectional abdominal imaging and colonoscopy that have been unremarkable.  Given the postprandial symptoms she is presenting, will need to evaluate this further with a CT angio of the abdomen and pelvis with IV contrast to rule out mesenteric ischemia.  As she has presented some improvement with the use of Bentyl, this will be refilled and she will need to take it as needed.  -Schedule CT angio abdomen/pelvis with IV contrast -Restart Bentyl as needed for abdominal pain control  All questions were answered.      [name] Blazing, MD Gastroenterology and Hepatology [facility]

## 2023-07-18 NOTE — Telephone Encounter (Signed)
Closing encounter, duplicate.

## 2023-07-18 NOTE — Patient Instructions (Signed)
 Schedule CT angio abdomen/pelvis with IV contrast Restart Bentyl as needed

## 2023-07-18 NOTE — Telephone Encounter (Signed)
 Copied from CRM [id number]. Topic: General - Other >> Jul 18, 2023 12:34 PM Martha Clan wrote: Reason for CRM: Patient submitted paperwork a week ago and is requesting that she gets a call back regarding the paperwork. Patient needs it signed before tomorrow.

## 2023-07-18 NOTE — Telephone Encounter (Signed)
 Informed paperwork ready

## 2023-07-18 NOTE — Telephone Encounter (Signed)
 Copied from CRM [id number]. Topic: General - Other >> Jul 18, 2023 12:14 PM Clide Dales wrote: Patient called to see if the form she dropped off on Tuesday has been completed by Dr. Darlyn Read. Patient would like to pick that up today. Please advise.

## 2023-07-23 ENCOUNTER — Encounter (INDEPENDENT_AMBULATORY_CARE_PROVIDER_SITE_OTHER): Payer: Self-pay

## 2023-07-23 ENCOUNTER — Ambulatory Visit: Payer: 59 | Admitting: Family Medicine

## 2023-08-02 NOTE — Telephone Encounter (Signed)
 Copied from CRM [id number]. Topic: Referral - Question >> Aug 02, 2023 12:49 PM Izetta Dakin wrote: Reason for CRM: Shelia with [facility] states that only PT for patient can be completed and that they do not have the availability for the skilled nursing. She states that a verbal order for PT only can be called in . Please ask to speak with Neysa Bonito or Tobi Bastos. Callback # [phone]

## 2023-08-05 NOTE — Telephone Encounter (Signed)
 Charisty from [facility] called in, requesting skilled nursing to be removed from the order and just leave PT

## 2023-08-05 NOTE — Telephone Encounter (Signed)
 TC back to [facility], first # [phone] just rang Called [phone] and spoke to [employer] giving VO for PT only remove nursing.

## 2023-08-05 NOTE — Telephone Encounter (Signed)
 Kept ringing unable to provide order or leave a message 3/24 LS

## 2023-08-05 NOTE — Telephone Encounter (Signed)
 Please review

## 2023-08-05 NOTE — Telephone Encounter (Signed)
 Thank you. Noted. LS

## 2023-08-05 NOTE — Telephone Encounter (Signed)
 Please call in verbal order for PT

## 2023-08-08 ENCOUNTER — Ambulatory Visit (INDEPENDENT_AMBULATORY_CARE_PROVIDER_SITE_OTHER): Payer: 59 | Admitting: Family Medicine

## 2023-08-08 ENCOUNTER — Telehealth: Payer: Self-pay | Admitting: Family Medicine

## 2023-08-08 ENCOUNTER — Encounter: Payer: Self-pay | Admitting: Family Medicine

## 2023-08-08 ENCOUNTER — Ambulatory Visit (INDEPENDENT_AMBULATORY_CARE_PROVIDER_SITE_OTHER)

## 2023-08-08 VITALS — BP 152/75 | HR 76 | Temp 97.8°F | Ht 61.0 in | Wt 157.0 lb

## 2023-08-08 DIAGNOSIS — M25552 Pain in left hip: Secondary | ICD-10-CM | POA: Diagnosis not present

## 2023-08-08 DIAGNOSIS — I2510 Atherosclerotic heart disease of native coronary artery without angina pectoris: Secondary | ICD-10-CM | POA: Diagnosis not present

## 2023-08-08 DIAGNOSIS — M25551 Pain in right hip: Secondary | ICD-10-CM

## 2023-08-08 DIAGNOSIS — R7303 Prediabetes: Secondary | ICD-10-CM | POA: Diagnosis not present

## 2023-08-08 DIAGNOSIS — E782 Mixed hyperlipidemia: Secondary | ICD-10-CM

## 2023-08-08 DIAGNOSIS — L309 Dermatitis, unspecified: Secondary | ICD-10-CM

## 2023-08-08 DIAGNOSIS — I10 Essential (primary) hypertension: Secondary | ICD-10-CM | POA: Diagnosis not present

## 2023-08-08 DIAGNOSIS — I5022 Chronic systolic (congestive) heart failure: Secondary | ICD-10-CM | POA: Diagnosis not present

## 2023-08-08 LAB — CMP14+EGFR
ALT: 11 IU/L (ref 0–32)
AST: 22 IU/L (ref 0–40)
Albumin: 4.5 g/dL (ref 3.7–4.7)
Alkaline Phosphatase: 81 IU/L (ref 44–121)
BUN/Creatinine Ratio: 11 — ABNORMAL LOW (ref 12–28)
BUN: 8 mg/dL (ref 8–27)
Bilirubin Total: 0.7 mg/dL (ref 0.0–1.2)
CO2: 25 mmol/L (ref 20–29)
Calcium: 11 mg/dL — ABNORMAL HIGH (ref 8.7–10.3)
Chloride: 103 mmol/L (ref 96–106)
Creatinine, Ser: 0.73 mg/dL (ref 0.57–1.00)
Globulin, Total: 2.3 g/dL (ref 1.5–4.5)
Glucose: 93 mg/dL (ref 70–99)
Potassium: 5.1 mmol/L (ref 3.5–5.2)
Sodium: 143 mmol/L (ref 134–144)
Total Protein: 6.8 g/dL (ref 6.0–8.5)
eGFR: 80 mL/min/{1.73_m2} (ref >59)

## 2023-08-08 LAB — CBC WITH DIFFERENTIAL/PLATELET
Basophils Absolute: 0.1 10*3/uL (ref 0.0–0.2)
Basos: 1 %
EOS (ABSOLUTE): 0.4 10*3/uL (ref 0.0–0.4)
Eos: 5 %
Hematocrit: 41.5 % (ref 34.0–46.6)
Hemoglobin: 13.3 g/dL (ref 11.1–15.9)
Immature Grans (Abs): 0 10*3/uL (ref 0.0–0.1)
Immature Granulocytes: 0 %
Lymphocytes Absolute: 2 10*3/uL (ref 0.7–3.1)
Lymphs: 29 %
MCH: 26.6 pg (ref 26.6–33.0)
MCHC: 32 g/dL (ref 31.5–35.7)
MCV: 83 fL (ref 79–97)
Monocytes Absolute: 0.9 10*3/uL (ref 0.1–0.9)
Monocytes: 14 %
Neutrophils Absolute: 3.4 10*3/uL (ref 1.4–7.0)
Neutrophils: 51 %
Platelets: 330 10*3/uL (ref 150–450)
RBC: 5 x10E6/uL (ref 3.77–5.28)
RDW: 15.2 % (ref 11.7–15.4)
WBC: 6.7 10*3/uL (ref 3.4–10.8)

## 2023-08-08 LAB — BAYER DCA HB A1C WAIVED: HB A1C (BAYER DCA - WAIVED): 5.6 % (ref 4.8–5.6)

## 2023-08-08 MED ORDER — FLUOCINONIDE 0.05 % EX CREA: 1.0000 | TOPICAL_CREAM | Freq: Two times a day (BID) | CUTANEOUS | 3 refills | Status: AC

## 2023-08-08 NOTE — Telephone Encounter (Signed)
 3 mos

## 2023-08-08 NOTE — Progress Notes (Signed)
 Subjective:  Patient ID: [name], female    DOB: 1936-03-07  Age: 88 y.o. MRN: [id number]  CC: Medical Management of Chronic Issues (Therapist called and said they are coming to her house today. Pt does not know what for. ) and Hand pain (Hand pain, itching and burning, Can't grip and dropping things. On both hands. Started up about a week ago. Skin is cracking and its going up into her wrist. )   HPI [name] presents for need for PT at home due to weakness especially of hsnds. She is having hip pain that slows her ambulation and makes her unsteady. PT.will look at this as well. Hands also itch, burn. Has macular erythema in numerous areas.   presents for  follow-up of hypertension. Patient has no history of headache chest pain or shortness of breath or recent cough. Patient also denies symptoms of TIA such as focal numbness or weakness. Patient denies side effects from medication. States taking it regularly.      08/08/2023   10:27 AM 07/11/2023    3:37 PM 05/01/2023    2:43 PM  Depression screen PHQ 2/9  Decreased Interest 1 1 0  Down, Depressed, Hopeless 1 1 0  PHQ - 2 Score 2 2 0  Altered sleeping 1 1   Tired, decreased energy 2 3   Change in appetite 1 1   Feeling bad or failure about yourself  0 1   Trouble concentrating 3 3   Moving slowly or fidgety/restless 2 0   Suicidal thoughts 0 0   PHQ-9 Score 11 11   Difficult doing work/chores Somewhat difficult Not difficult at all     History [name] has a past medical history of Acute on chronic respiratory failure with hypoxia (HCC) (04/24/2018), Anxiety, Atypical chest pain, Bronchospasm, CAD (coronary artery disease), Cervical disc disorder with myelopathy, unspecified cervical region, Cervicalgia (01/19/2009), Chest pain at rest (01/27/2015), Chronic systolic (congestive) heart failure (HCC), COPD (chronic obstructive pulmonary disease) (HCC), Coronary artery disease, De Quervain's disease (tenosynovitis) (04/02/2011),  Disc disease with myelopathy, cervical, Essential hypertension, Hemorrhoids, Liver mass, Lung, cysts, congenital, Myocardial infarction (HCC), Nephrolithiasis, Nonischemic cardiomyopathy (HCC), On home O2, Osteoarthritis, Sprain of wrist (08/07/2012), and Thoracic ascending aortic aneurysm (HCC).   She has a past surgical history that includes Tonsillectomy and adenoidectomy; Complete hysterectomy; Benign breast tumors; Cholecystectomy; Colonoscopy; Colonoscopy (N/A, 09/22/2014); LEFT HEART CATH AND CORONARY ANGIOGRAPHY (N/A, 11/23/2016); CORONARY STENT INTERVENTION (N/A, 11/23/2016); Colonoscopy with propofol (N/A, 04/16/2023); and Polypectomy (04/16/2023).   Her family history includes Aneurysm in her father; Diabetes in her brother; Heart disease in her brother, mother, and sister; Lung cancer in her brother.She reports that she quit smoking about 15 years ago. Her smoking use included cigarettes. She started smoking about 46 years ago. She has a 7.8 pack-year smoking history. She has never used smokeless tobacco. She reports that she does not drink alcohol and does not use drugs.    ROS Review of Systems  Constitutional: Negative.   HENT: Negative.    Eyes:  Negative for visual disturbance.  Respiratory:  Negative for shortness of breath.   Cardiovascular:  Negative for chest pain.  Gastrointestinal:  Negative for abdominal pain.  Musculoskeletal:  Negative for arthralgias.    Objective:  BP (!) 152/75   Pulse 76   Temp 97.8 F (36.6 C)   Ht 5\' 1"  (1.549 m)   Wt 157 lb (71.2 kg)   SpO2 95%   BMI 29.66 kg/m  BP Readings from Last 3 Encounters:  08/08/23 (!) 152/75  07/18/23 (!) 148/73  07/11/23 137/72    Wt Readings from Last 3 Encounters:  08/08/23 157 lb (71.2 kg)  07/18/23 163 lb 9.6 oz (74.2 kg)  07/11/23 157 lb (71.2 kg)     Physical Exam Constitutional:      General: She is not in acute distress.    Appearance: She is well-developed.  Cardiovascular:     Rate and  Rhythm: Normal rate and regular rhythm.  Pulmonary:     Breath sounds: Normal breath sounds.  Musculoskeletal:        General: Normal range of motion.  Skin:    General: Skin is warm and dry.  Neurological:     Mental Status: She is alert and oriented to person, place, and time.      Assessment & Plan:  Coronary artery disease involving native coronary artery of native heart without angina pectoris -     CMP14+EGFR  Chronic HFrEF (heart failure with reduced ejection fraction) (HCC) -     CBC with Differential/Platelet -     CMP14+EGFR  Prediabetes -     Bayer DCA Hb A1c Waived -     CMP14+EGFR  Essential hypertension -     CBC with Differential/Platelet -     CMP14+EGFR  Mixed hyperlipidemia -     CMP14+EGFR  Bilateral hip pain -     CBC with Differential/Platelet -     DG HIPS BILAT W OR W/O PELVIS MIN 5 VIEWS; Future  Eczema, unspecified type -     Fluocinonide; Apply 1 Application topically 2 (two) times daily.  Dispense: 120 g; Refill: 3   P.T. ordered. Plan to assess today  Follow-up: Return in about 6 weeks (around 09/19/2023).  Mechele Claude, M.D.

## 2023-08-08 NOTE — Telephone Encounter (Signed)
Pt has been scheduled.    LS

## 2023-08-09 ENCOUNTER — Telehealth: Payer: Self-pay

## 2023-08-09 ENCOUNTER — Telehealth: Payer: Self-pay | Admitting: Family Medicine

## 2023-08-09 NOTE — Telephone Encounter (Signed)
 Copied from CRM [id number]. Topic: Clinical - Medical Advice >> Aug 09, 2023  2:25 PM Tiffany S wrote: Reason for CRM: Drug interaction with   Fluticasone-Umeclidin-[facility] ELLIPTA) 100-62.5-25 MCG/ACT AEPB [[id number]]  albuterol (VENTOLIN HFA) 108 (90 Base) MCG/ACT inhaler [[id number]]  [id number] Emiliano Dyer

## 2023-08-09 NOTE — Telephone Encounter (Unsigned)
 Copied from CRM [id number]. Topic: General - Other >> Aug 09, 2023  2:15 PM Emylou G wrote: Reason for CRM: Cherisse w/Home Health.. evaluated for PT.. need verbal orders  1xw for 6weeks.Marland Kitchen strengthening, balance, gate training, transfers training.. she shows signs of depression score PHQ13 moderate she isn't taking any meds for it .. she isn't taking her traMADol (ULTRAM) 50 MG tablet, pantoprazole (PROTONIX) 40 MG tablet, rosuvastatin (CRESTOR) 20 MG tablet, fenofibrate (TRICOR) 48 MG tablet.. she is waiting for her fenofibrate (TRICOR) 48 MG tablet from her visit..  ** wanted to know the patient didn't have recent hospital visit or surgery?  she has memory issues.Marland Kitchen difficult walking, balance and several falls.. Number [phone] and is secured line

## 2023-08-09 NOTE — Telephone Encounter (Signed)
 Please review

## 2023-08-12 ENCOUNTER — Encounter: Payer: Self-pay | Admitting: Family Medicine

## 2023-08-12 ENCOUNTER — Other Ambulatory Visit: Payer: Self-pay | Admitting: Family Medicine

## 2023-08-12 DIAGNOSIS — E7800 Pure hypercholesterolemia, unspecified: Secondary | ICD-10-CM

## 2023-08-12 MED ORDER — FENOFIBRATE 48 MG PO TABS: 48.0000 mg | ORAL_TABLET | Freq: Every day | ORAL | 3 refills | Status: AC

## 2023-08-12 NOTE — Telephone Encounter (Signed)
 Information given on a secure line to her physical therapist.

## 2023-08-12 NOTE — Progress Notes (Signed)
Hello Tishanna,  Your lab result is normal and/or stable.Some minor variations that are not significant are commonly marked abnormal, but do not represent any medical problem for you.  Best regards, Gracie Gupta, M.D.

## 2023-08-12 NOTE — Telephone Encounter (Signed)
 Please give verbal order for home health P.T.

## 2023-08-12 NOTE — Telephone Encounter (Signed)
 This drug interaction is a positive. It means the two drugs work together to improve shortness of breath.

## 2023-08-12 NOTE — Telephone Encounter (Signed)
 Left detailed message on secure vm to physical therapist stating reaction is good and to continue both medications. LS

## 2023-08-22 ENCOUNTER — Ambulatory Visit (HOSPITAL_COMMUNITY)
Admission: RE | Admit: 2023-08-22 | Discharge: 2023-08-22 | Disposition: A | Discharge status: Home / Self Care | Source: Ambulatory Visit | Attending: Gastroenterology | Admitting: Gastroenterology

## 2023-08-22 DIAGNOSIS — R1031 Right lower quadrant pain: Secondary | ICD-10-CM | POA: Diagnosis present

## 2023-08-22 MED ORDER — IOHEXOL 350 MG/ML SOLN
100.0000 mL | Freq: Once | INTRAVENOUS | Status: AC | PRN
  Administered 2023-08-22: 100 mL via INTRAVENOUS

## 2023-09-03 ENCOUNTER — Telehealth: Payer: Self-pay | Admitting: Gastroenterology

## 2023-09-03 NOTE — Telephone Encounter (Signed)
 Pt called asking if her CT results were available yet. [phone]

## 2023-09-03 NOTE — Telephone Encounter (Signed)
 Urban Garden, MD 09/02/2023  4:46 PM EDT     I called the patient to inform about the results of recent CT angio of the abdomen and pelvis which showed stenosis of the celiac axis and proximal segment of the SMA, as well as moderate stenosis of the proximal segment of the right renal artery.  We will need to refer her to interventional radiology for further evaluation.  The patient understood and agreed, but will like to have a letter sent to her home with these findings.     Ann, please send a referral for interventional radiology.  Diagnosis: Chronic mesenteric ischemia   Please also send her a letter stating the following: " You are found to have blockage of 2 of the 3 main arteries supplying blood to your abdomen.  This can explain why you are having abdominal pain.  We will need to refer you to a specialist in [facility] (interventional radiology) who can offer you options for treating these blockages".   Thanks   Samantha Cress, MD Gastroenterology and Hepatology [facility] Gastroenterology

## 2023-09-03 NOTE — Telephone Encounter (Signed)
 I spoke with the patient and made her aware per Dr. Sammi Crick,  You are found to have blockage of 2 of the 3 main arteries supplying blood to your abdomen.  This can explain why you are having abdominal pain.  We will need to refer you to a specialist in [facility] (interventional radiology) who can offer you options for treating these blockages". Patient states understanding and will await the call from Interventional Radiology.

## 2023-09-04 ENCOUNTER — Other Ambulatory Visit: Payer: Self-pay | Admitting: Gastroenterology

## 2023-09-04 DIAGNOSIS — K559 Vascular disorder of intestine, unspecified: Secondary | ICD-10-CM

## 2023-09-04 NOTE — Telephone Encounter (Signed)
 Referral sent, they will contact patient with apt

## 2023-09-05 ENCOUNTER — Emergency Department (HOSPITAL_COMMUNITY)

## 2023-09-05 ENCOUNTER — Inpatient Hospital Stay (HOSPITAL_COMMUNITY)
Admission: EM | Admit: 2023-09-05 | Discharge: 2023-09-09 | DRG: 190 | Disposition: A | Discharge status: Home Health | Attending: Family Medicine | Admitting: Family Medicine

## 2023-09-05 ENCOUNTER — Encounter (HOSPITAL_COMMUNITY): Payer: Self-pay | Admitting: *Deleted

## 2023-09-05 ENCOUNTER — Other Ambulatory Visit: Payer: Self-pay

## 2023-09-05 DIAGNOSIS — Z833 Family history of diabetes mellitus: Secondary | ICD-10-CM

## 2023-09-05 DIAGNOSIS — J189 Pneumonia, unspecified organism: Principal | ICD-10-CM | POA: Diagnosis present

## 2023-09-05 DIAGNOSIS — J440 Chronic obstructive pulmonary disease with acute lower respiratory infection: Secondary | ICD-10-CM | POA: Diagnosis present

## 2023-09-05 DIAGNOSIS — Z7983 Long term (current) use of bisphosphonates: Secondary | ICD-10-CM | POA: Diagnosis not present

## 2023-09-05 DIAGNOSIS — J441 Chronic obstructive pulmonary disease with (acute) exacerbation: Secondary | ICD-10-CM | POA: Diagnosis present

## 2023-09-05 DIAGNOSIS — Z885 Allergy status to narcotic agent status: Secondary | ICD-10-CM

## 2023-09-05 DIAGNOSIS — I10 Essential (primary) hypertension: Secondary | ICD-10-CM | POA: Diagnosis not present

## 2023-09-05 DIAGNOSIS — I119 Hypertensive heart disease without heart failure: Secondary | ICD-10-CM | POA: Diagnosis present

## 2023-09-05 DIAGNOSIS — Z87891 Personal history of nicotine dependence: Secondary | ICD-10-CM

## 2023-09-05 DIAGNOSIS — E782 Mixed hyperlipidemia: Secondary | ICD-10-CM | POA: Diagnosis present

## 2023-09-05 DIAGNOSIS — Z955 Presence of coronary angioplasty implant and graft: Secondary | ICD-10-CM | POA: Diagnosis not present

## 2023-09-05 DIAGNOSIS — Z8249 Family history of ischemic heart disease and other diseases of the circulatory system: Secondary | ICD-10-CM | POA: Diagnosis not present

## 2023-09-05 DIAGNOSIS — K219 Gastro-esophageal reflux disease without esophagitis: Secondary | ICD-10-CM | POA: Diagnosis present

## 2023-09-05 DIAGNOSIS — Z9049 Acquired absence of other specified parts of digestive tract: Secondary | ICD-10-CM

## 2023-09-05 DIAGNOSIS — I255 Ischemic cardiomyopathy: Secondary | ICD-10-CM | POA: Diagnosis present

## 2023-09-05 DIAGNOSIS — R4189 Other symptoms and signs involving cognitive functions and awareness: Secondary | ICD-10-CM | POA: Diagnosis present

## 2023-09-05 DIAGNOSIS — I5042 Chronic combined systolic (congestive) and diastolic (congestive) heart failure: Secondary | ICD-10-CM | POA: Diagnosis present

## 2023-09-05 DIAGNOSIS — I2510 Atherosclerotic heart disease of native coronary artery without angina pectoris: Secondary | ICD-10-CM | POA: Diagnosis present

## 2023-09-05 DIAGNOSIS — Z7951 Long term (current) use of inhaled steroids: Secondary | ICD-10-CM

## 2023-09-05 DIAGNOSIS — I447 Left bundle-branch block, unspecified: Secondary | ICD-10-CM | POA: Diagnosis present

## 2023-09-05 DIAGNOSIS — Z888 Allergy status to other drugs, medicaments and biological substances status: Secondary | ICD-10-CM

## 2023-09-05 DIAGNOSIS — R0902 Hypoxemia: Secondary | ICD-10-CM | POA: Diagnosis present

## 2023-09-05 DIAGNOSIS — F419 Anxiety disorder, unspecified: Secondary | ICD-10-CM | POA: Diagnosis present

## 2023-09-05 DIAGNOSIS — I7121 Aneurysm of the ascending aorta, without rupture: Secondary | ICD-10-CM | POA: Diagnosis present

## 2023-09-05 DIAGNOSIS — K551 Chronic vascular disorders of intestine: Secondary | ICD-10-CM | POA: Diagnosis present

## 2023-09-05 DIAGNOSIS — Z90710 Acquired absence of both cervix and uterus: Secondary | ICD-10-CM

## 2023-09-05 DIAGNOSIS — G9332 Myalgic encephalomyelitis/chronic fatigue syndrome: Secondary | ICD-10-CM | POA: Diagnosis present

## 2023-09-05 DIAGNOSIS — Z1152 Encounter for screening for COVID-19: Secondary | ICD-10-CM

## 2023-09-05 DIAGNOSIS — I2089 Other forms of angina pectoris: Secondary | ICD-10-CM | POA: Diagnosis present

## 2023-09-05 DIAGNOSIS — I252 Old myocardial infarction: Secondary | ICD-10-CM

## 2023-09-05 DIAGNOSIS — Z79899 Other long term (current) drug therapy: Secondary | ICD-10-CM

## 2023-09-05 DIAGNOSIS — J449 Chronic obstructive pulmonary disease, unspecified: Secondary | ICD-10-CM | POA: Diagnosis present

## 2023-09-05 DIAGNOSIS — G8929 Other chronic pain: Secondary | ICD-10-CM | POA: Diagnosis present

## 2023-09-05 DIAGNOSIS — Z87442 Personal history of urinary calculi: Secondary | ICD-10-CM

## 2023-09-05 DIAGNOSIS — R0602 Shortness of breath: Secondary | ICD-10-CM | POA: Diagnosis present

## 2023-09-05 DIAGNOSIS — I428 Other cardiomyopathies: Secondary | ICD-10-CM

## 2023-09-05 DIAGNOSIS — I110 Hypertensive heart disease with heart failure: Secondary | ICD-10-CM | POA: Diagnosis present

## 2023-09-05 DIAGNOSIS — Z801 Family history of malignant neoplasm of trachea, bronchus and lung: Secondary | ICD-10-CM

## 2023-09-05 DIAGNOSIS — Z7982 Long term (current) use of aspirin: Secondary | ICD-10-CM

## 2023-09-05 LAB — COMPREHENSIVE METABOLIC PANEL WITH GFR
ALT: 12 U/L (ref 0–44)
AST: 20 U/L (ref 15–41)
Albumin: 3.6 g/dL (ref 3.5–5.0)
Alkaline Phosphatase: 55 U/L (ref 38–126)
Anion gap: 9 (ref 5–15)
BUN: 10 mg/dL (ref 8–23)
CO2: 24 mmol/L (ref 22–32)
Calcium: 9.2 mg/dL (ref 8.9–10.3)
Chloride: 105 mmol/L (ref 98–111)
Creatinine, Ser: 0.63 mg/dL (ref 0.44–1.00)
GFR, Estimated: >60 mL/min (ref >60)
Glucose, Bld: 99 mg/dL (ref 70–99)
Potassium: 3.7 mmol/L (ref 3.5–5.1)
Sodium: 138 mmol/L (ref 135–145)
Total Bilirubin: 0.6 mg/dL (ref 0.0–1.2)
Total Protein: 6.4 g/dL — ABNORMAL LOW (ref 6.5–8.1)

## 2023-09-05 LAB — CBC WITH DIFFERENTIAL/PLATELET
Abs Immature Granulocytes: 0.01 10*3/uL (ref 0.00–0.07)
Basophils Absolute: 0 10*3/uL (ref 0.0–0.1)
Basophils Relative: 1 %
Eosinophils Absolute: 0.1 10*3/uL (ref 0.0–0.5)
Eosinophils Relative: 4 %
HCT: 38.1 % (ref 36.0–46.0)
Hemoglobin: 12 g/dL (ref 12.0–15.0)
Immature Granulocytes: 0 %
Lymphocytes Relative: 31 %
Lymphs Abs: 1.1 10*3/uL (ref 0.7–4.0)
MCH: 26.4 pg (ref 26.0–34.0)
MCHC: 31.5 g/dL (ref 30.0–36.0)
MCV: 83.7 fL (ref 80.0–100.0)
Monocytes Absolute: 0.6 10*3/uL (ref 0.1–1.0)
Monocytes Relative: 16 %
Neutro Abs: 1.7 10*3/uL (ref 1.7–7.7)
Neutrophils Relative %: 48 %
Platelets: 209 10*3/uL (ref 150–400)
RBC: 4.55 MIL/uL (ref 3.87–5.11)
RDW: 16.1 % — ABNORMAL HIGH (ref 11.5–15.5)
WBC: 3.4 10*3/uL — ABNORMAL LOW (ref 4.0–10.5)
nRBC: 0 % (ref 0.0–0.2)

## 2023-09-05 LAB — RESP PANEL BY RT-PCR (RSV, FLU A&B, COVID)  RVPGX2
Influenza A by PCR: NEGATIVE
Influenza B by PCR: NEGATIVE
Resp Syncytial Virus by PCR: NEGATIVE
SARS Coronavirus 2 by RT PCR: NEGATIVE

## 2023-09-05 LAB — BLOOD GAS, VENOUS
Acid-Base Excess: 5 mmol/L — ABNORMAL HIGH (ref 0.0–2.0)
Bicarbonate: 29.9 mmol/L — ABNORMAL HIGH (ref 20.0–28.0)
Drawn by: 68716
O2 Saturation: 75.1 %
Patient temperature: 37.3
pCO2, Ven: 45 mmHg (ref 44–60)
pH, Ven: 7.44 — ABNORMAL HIGH (ref 7.25–7.43)
pO2, Ven: 42 mmHg (ref 32–45)

## 2023-09-05 LAB — BRAIN NATRIURETIC PEPTIDE: B Natriuretic Peptide: 460 pg/mL — ABNORMAL HIGH (ref 0.0–100.0)

## 2023-09-05 LAB — TROPONIN I (HIGH SENSITIVITY): Troponin I (High Sensitivity): 11 ng/L (ref <18)

## 2023-09-05 LAB — PROCALCITONIN: Procalcitonin: <0.1 ng/mL

## 2023-09-05 MED ORDER — AZITHROMYCIN 250 MG PO TABS
500.0000 mg | ORAL_TABLET | Freq: Every day | ORAL | Status: AC
  Administered 2023-09-06 – 2023-09-09 (×4): 500 mg via ORAL
  Filled 2023-09-05 (×4): qty 2

## 2023-09-05 MED ORDER — BISOPROLOL FUMARATE 5 MG PO TABS
2.5000 mg | ORAL_TABLET | Freq: Every day | ORAL | Status: DC
  Administered 2023-09-05 – 2023-09-09 (×5): 2.5 mg via ORAL
  Filled 2023-09-05 (×5): qty 1

## 2023-09-05 MED ORDER — FENTANYL CITRATE PF 50 MCG/ML IJ SOSY: 12.5000 ug | PREFILLED_SYRINGE | INTRAMUSCULAR | Status: DC | PRN

## 2023-09-05 MED ORDER — IPRATROPIUM-ALBUTEROL 0.5-2.5 (3) MG/3ML IN SOLN
3.0000 mL | Freq: Once | RESPIRATORY_TRACT | Status: AC
  Administered 2023-09-05: 3 mL via RESPIRATORY_TRACT
  Filled 2023-09-05: qty 3

## 2023-09-05 MED ORDER — ENOXAPARIN SODIUM 40 MG/0.4ML IJ SOSY
40.0000 mg | PREFILLED_SYRINGE | INTRAMUSCULAR | Status: DC
  Administered 2023-09-05 – 2023-09-08 (×4): 40 mg via SUBCUTANEOUS
  Filled 2023-09-05 (×4): qty 0.4

## 2023-09-05 MED ORDER — IPRATROPIUM-ALBUTEROL 0.5-2.5 (3) MG/3ML IN SOLN
3.0000 mL | Freq: Four times a day (QID) | RESPIRATORY_TRACT | Status: DC
  Administered 2023-09-05 – 2023-09-08 (×14): 3 mL via RESPIRATORY_TRACT
  Filled 2023-09-05 (×14): qty 3

## 2023-09-05 MED ORDER — BISACODYL 5 MG PO TBEC: 5.0000 mg | DELAYED_RELEASE_TABLET | Freq: Every day | ORAL | Status: DC | PRN

## 2023-09-05 MED ORDER — IOHEXOL 350 MG/ML SOLN
100.0000 mL | Freq: Once | INTRAVENOUS | Status: AC | PRN
Start: 2023-09-05 — End: 2023-09-05
  Administered 2023-09-05: 100 mL via INTRAVENOUS

## 2023-09-05 MED ORDER — ACETAMINOPHEN 650 MG RE SUPP: 650.0000 mg | Freq: Four times a day (QID) | RECTAL | Status: DC | PRN

## 2023-09-05 MED ORDER — IPRATROPIUM-ALBUTEROL 0.5-2.5 (3) MG/3ML IN SOLN
3.0000 mL | RESPIRATORY_TRACT | Status: AC
  Administered 2023-09-05 (×2): 3 mL via RESPIRATORY_TRACT
  Filled 2023-09-05: qty 6

## 2023-09-05 MED ORDER — ACETAMINOPHEN 325 MG PO TABS: 650.0000 mg | ORAL_TABLET | Freq: Four times a day (QID) | ORAL | Status: DC | PRN

## 2023-09-05 MED ORDER — OXYCODONE HCL 5 MG PO TABS
5.0000 mg | ORAL_TABLET | ORAL | Status: DC | PRN
  Filled 2023-09-05: qty 1

## 2023-09-05 MED ORDER — PANTOPRAZOLE SODIUM 40 MG PO TBEC
40.0000 mg | DELAYED_RELEASE_TABLET | Freq: Every day | ORAL | Status: DC
  Administered 2023-09-05 – 2023-09-08 (×4): 40 mg via ORAL
  Filled 2023-09-05 (×4): qty 1

## 2023-09-05 MED ORDER — METHYLPREDNISOLONE SODIUM SUCC 40 MG IJ SOLR
40.0000 mg | Freq: Two times a day (BID) | INTRAMUSCULAR | Status: DC
  Administered 2023-09-05 – 2023-09-09 (×8): 40 mg via INTRAVENOUS
  Filled 2023-09-05 (×8): qty 1

## 2023-09-05 MED ORDER — TRAZODONE HCL 50 MG PO TABS
25.0000 mg | ORAL_TABLET | Freq: Every evening | ORAL | Status: DC | PRN
  Administered 2023-09-06 – 2023-09-07 (×2): 25 mg via ORAL
  Filled 2023-09-05 (×3): qty 1

## 2023-09-05 MED ORDER — SODIUM CHLORIDE 0.9 % IV SOLN
2.0000 g | INTRAVENOUS | Status: AC
  Administered 2023-09-06 – 2023-09-09 (×4): 2 g via INTRAVENOUS
  Filled 2023-09-05 (×4): qty 20

## 2023-09-05 MED ORDER — SODIUM CHLORIDE 0.9 % IV SOLN
1.0000 g | Freq: Once | INTRAVENOUS | Status: AC
  Administered 2023-09-05: 1 g via INTRAVENOUS
  Filled 2023-09-05: qty 10

## 2023-09-05 MED ORDER — GUAIFENESIN ER 600 MG PO TB12
600.0000 mg | ORAL_TABLET | Freq: Two times a day (BID) | ORAL | Status: AC
  Administered 2023-09-05 – 2023-09-07 (×6): 600 mg via ORAL
  Filled 2023-09-05 (×6): qty 1

## 2023-09-05 MED ORDER — SODIUM CHLORIDE 0.9 % IV SOLN
500.0000 mg | Freq: Once | INTRAVENOUS | Status: AC
  Administered 2023-09-05: 500 mg via INTRAVENOUS
  Filled 2023-09-05: qty 5

## 2023-09-05 MED ORDER — METHYLPREDNISOLONE SODIUM SUCC 125 MG IJ SOLR
125.0000 mg | Freq: Once | INTRAMUSCULAR | Status: AC
  Administered 2023-09-05: 125 mg via INTRAVENOUS
  Filled 2023-09-05: qty 2

## 2023-09-05 MED ORDER — DEXTROMETHORPHAN POLISTIREX ER 30 MG/5ML PO SUER
30.0000 mg | Freq: Two times a day (BID) | ORAL | Status: DC | PRN
  Administered 2023-09-08: 30 mg via ORAL
  Filled 2023-09-05: qty 5

## 2023-09-05 NOTE — ED Notes (Signed)
 Contacted daughter and let her know that her mother was being admitted.

## 2023-09-05 NOTE — Progress Notes (Signed)
   09/05/23 1908  TOC Brief Assessment  Insurance and Status Reviewed  Patient has primary care physician Yes  Home environment has been reviewed From home c/daughter  Prior level of function: Independent  Prior/Current Home Services Current home services (Oxygen  c/[facility]; HHPT c/[facility].)  Social Drivers of Health Review SDOH reviewed no interventions necessary  Readmission risk has been reviewed Yes  Transition of care needs no transition of care needs at this time   Pt has cane, walker, rollator, bsc, grab bars.  Transition of Care Department [facility]) has reviewed patient and no other TOC needs have been identified at this time. We will continue to monitor patient advancement through interdisciplinary progression rounds. If new patient needs arise, please place a TOC consult.

## 2023-09-05 NOTE — ED Notes (Signed)
 Patients daughter would like an update when we know a disposition please. Phone number is listed under contacts.

## 2023-09-05 NOTE — ED Notes (Signed)
 Patient ambulated to the restroom

## 2023-09-05 NOTE — ED Provider Notes (Signed)
 [place] EMERGENCY DEPARTMENT AT [facility] Provider Note  CSN: [id number] Arrival date & time: 09/05/23 5284  Chief Complaint(s) Cough and Shortness of Breath  HPI [name] is a 88 y.o. female with past medical history as below, significant for aortic aneurysm, COPD, home o2 2L at bedtime, CAD, ischemic cardiomyopathy, LBBB, HFrEF who presents to the ED with complaint of cough/dib, cp.  Symptoms progressively worsening over the past 3 days, worsening difficulty breathing with exertion.  She usually wears 2 L oxygen  at nighttime to set for 2 L oxygen  24/7.  She is having chest pain radiates midsternal to her back is also progressively worsening.  Coughing yellow/green sputum.  Fevers but no chills.  No vomiting or nausea.  No numbness or tingling to extremities.  Past Medical History Past Medical History:  Diagnosis Date   Acute on chronic respiratory failure with hypoxia (HCC) 04/24/2018   Anxiety    Atypical chest pain    Bronchospasm    CAD (coronary artery disease)    a. s/p DES to mid-LAD and DES to mid-OM1 in 11/2016   Cervical disc disorder with myelopathy, unspecified cervical region    Cervicalgia 01/19/2009   Qualifier: Diagnosis of  By: Phyllis Breeze MD, Stanley     Chest pain at rest 01/27/2015   Chronic systolic (congestive) heart failure (HCC)    COPD (chronic obstructive pulmonary disease) (HCC)    Coronary artery disease    De Quervain's disease (tenosynovitis) 04/02/2011   Disc disease with myelopathy, cervical    Essential hypertension    Hemorrhoids    Liver mass    Lung, cysts, congenital    Left lung cyst   Myocardial infarction (HCC)    Nephrolithiasis    Embedded   Nonischemic cardiomyopathy (HCC)    LVEF 35-40% 2015   On home O2    Osteoarthritis    Sprain of wrist 08/07/2012   Thoracic ascending aortic aneurysm (HCC)    4.3 cm April 2016   Patient Active Problem List   Diagnosis Date Noted   RLQ abdominal pain 04/16/2023    Diverticulitis 03/03/2023   Chronic HFrEF (heart failure with reduced ejection fraction) (HCC) 02/28/2023   Acute diverticulitis 02/27/2023   Oropharyngeal dysphagia 01/31/2022   Supraglottic edema 01/25/2022   Near syncope 01/24/2022   Lumbar pain 12/05/2021   Primary osteoarthritis 10/26/2021   Obesity (BMI 30-39.9) 10/26/2021   Chronic fatigue syndrome 10/26/2021   Polyarthralgia 10/26/2021   Rheumatoid arthritis of multiple sites without rheumatoid factor (HCC) 09/11/2021   Chest pain due to coronary artery disease (HCC) 07/13/2021   Mixed hyperlipidemia 01/24/2021   Rash 01/24/2021   Left breast mass 05/27/2020   Impaired fasting glucose 04/27/2020   Cognitive impairment 04/27/2020   Urinary incontinence 04/27/2020   Chronic stable angina (HCC) 04/24/2018   Physical deconditioning 04/24/2018   CAD (coronary artery disease) 02/03/2018   Ischemic cardiomyopathy 11/25/2017   COPD (chronic obstructive pulmonary disease) (HCC) 04/01/2017   CAD S/P percutaneous coronary angioplasty 04/01/2017   NSTEMI (non-ST elevated myocardial infarction) (HCC) 11/23/2016   Hypertensive cardiovascular disease 10/03/2015   Demand ischemia of myocardium (HCC) 07/13/2015   Nonischemic cardiomyopathy (HCC)    Left bundle branch block    GERD (gastroesophageal reflux disease) 07/11/2015   Ascending aortic aneurysm (HCC) 01/27/2015   Chronic combined systolic and diastolic CHF (congestive heart failure) (HCC) 01/27/2015   Essential hypertension 01/27/2015   Anxiety 01/27/2015   Chronic pain 01/27/2015   Liver mass 05/21/2011  SHOULDER PAIN 01/19/2009   Home Medication(s) Prior to Admission medications   Medication Sig Start Date End Date Taking? Authorizing Provider  albuterol  (VENTOLIN  HFA) 108 (90 Base) MCG/ACT inhaler Inhale 2 puffs into the lungs every 6 (six) hours as needed for wheezing or shortness of breath. 11/20/22   Sood, Vineet, MD  alendronate  (FOSAMAX ) 70 MG tablet Take 1 tablet (70  mg total) by mouth every 7 (seven) days. Take with a full glass of water  on an empty stomach. Do not lay down for at least 2 hours 06/25/23   Roise Cleaver, MD  aspirin  EC 81 MG tablet Take 81 mg by mouth every morning.     [provider]  carvedilol  (COREG ) 3.125 MG tablet Take 1 tablet (3.125 mg total) by mouth 2 (two) times daily with a meal. Patient taking differently: Take 3.125 mg by mouth daily as needed (high bp). 07/30/22   Loyde Rule, MD  cetirizine  (ZYRTEC  ALLERGY) 10 MG tablet Take 1 tablet (10 mg total) by mouth daily. For itching 06/25/23   Roise Cleaver, MD  Cholecalciferol (VITAMIN D ) 50 MCG (2000 UT) tablet Take 1 tablet (2,000 Units total) by mouth daily. 10/22/22   Roise Cleaver, MD  clorazepate  (TRANXENE ) 7.5 MG tablet Take 1 tablet (7.5 mg total) by mouth daily as needed for anxiety. 06/07/23   Roise Cleaver, MD  dicyclomine  (BENTYL ) 10 MG capsule Take 1 capsule (10 mg total) by mouth every 12 (twelve) hours as needed (abdominal pain). 07/18/23   Castaneda Mayorga, Daniel, MD  fenofibrate  (TRICOR ) 48 MG tablet Take 1 tablet (48 mg total) by mouth daily. 08/12/23   Roise Cleaver, MD  fluocinonide  cream (LIDEX ) 0.05 % Apply 1 Application topically 2 (two) times daily. 08/08/23   Roise Cleaver, MD  Fluticasone -Umeclidin-Vilant (TRELEGY ELLIPTA ) 100-62.5-25 MCG/ACT AEPB Inhale 1 puff into the lungs daily in the afternoon. 11/20/22   Sood, Vineet, MD  isosorbide  mononitrate (IMDUR ) 30 MG 24 hr tablet Take 1 tablet (30 mg total) by mouth daily. 03/13/23 06/11/23  Strader, Dimple Francis, PA-C  linaclotide  (LINZESS ) 145 MCG CAPS capsule Take 1 capsule (145 mcg total) by mouth daily. To regulate bowel movements 05/01/23   Roise Cleaver, MD  ondansetron  (ZOFRAN ) 4 MG tablet Take 1 tablet (4 mg total) by mouth every 8 (eight) hours as needed for nausea or vomiting. 03/12/23   Yevette Hem, FNP  OXYGEN  Inhale 2 L into the lungs at bedtime. Can use in the morning as needed     [provider]  pantoprazole  (PROTONIX ) 40 MG tablet Take 1 tablet (40 mg total) by mouth daily. 03/04/23   Demaris Fillers, MD  rosuvastatin  (CRESTOR ) 20 MG tablet Take 1 tablet (20 mg total) by mouth daily. 11/13/22   Strader, Dimple Francis, PA-C  sacubitril -valsartan  (ENTRESTO ) 24-26 MG TAKE 1 TABLET BY MOUTH TWICE DAILY. 01/22/23   Loyde Rule, MD  traMADol  (ULTRAM ) 50 MG tablet Take 1 tablet (50 mg total) by mouth every 12 (twelve) hours as needed for moderate pain (pain score 4-6). 05/21/23   Roise Cleaver, MD  triamterene -hydrochlorothiazide (MAXZIDE-25) 37.5-25 MG tablet Take 1 tablet by mouth daily. For blood pressure and fluid Patient taking differently: Take 1 tablet by mouth daily as needed (blood pressure and fluid). 04/03/22   Roise Cleaver, MD  Past Surgical History Past Surgical History:  Procedure Laterality Date   Benign breast tumors     CHOLECYSTECTOMY     COLONOSCOPY     COLONOSCOPY N/A 09/22/2014   Procedure: COLONOSCOPY;  Surgeon: Ruby Corporal, MD;  Location: [facility];  Service: Endoscopy;  Laterality: N/A;  830 -- to be done in OR under fluoro   COLONOSCOPY WITH PROPOFOL  N/A 04/16/2023   Procedure: COLONOSCOPY WITH PROPOFOL ;  Surgeon: Urban Garden, MD;  Location: [facility];  Service: Gastroenterology;  Laterality: N/A;  11:00am, asa 3   Complete hysterectomy     CORONARY STENT INTERVENTION N/A 11/23/2016   Procedure: Coronary Stent Intervention;  Surgeon: Swaziland, Peter M, MD;  Location: [facility] INVASIVE CV LAB;  Service: Cardiovascular;  Laterality: N/A;   LEFT HEART CATH AND CORONARY ANGIOGRAPHY N/A 11/23/2016   Procedure: Left Heart Cath and Coronary Angiography;  Surgeon: Swaziland, Peter M, MD;  Location: [facility] INVASIVE CV LAB;  Service: Cardiovascular;  Laterality: N/A;   POLYPECTOMY  04/16/2023   Procedure: POLYPECTOMY  INTESTINAL;  Surgeon: Umberto Ganong, Bearl Limes, MD;  Location: [facility];  Service: Gastroenterology;;   TONSILLECTOMY AND ADENOIDECTOMY     Family History Family History  Problem Relation Age of Onset   Heart disease Mother    Aneurysm Father    Lung cancer Brother    Heart disease Sister    Diabetes Brother    Heart disease Brother     Social History Social History   Tobacco Use   Smoking status: Former    Current packs/day: 0.00    Average packs/day: 0.3 packs/day for 31.0 years (7.8 ttl pk-yrs)    Types: Cigarettes    Start date: 05/14/1977    Quit date: 05/28/2008    Years since quitting: 15.2   Smokeless tobacco: Never   Tobacco comments:    patient states she only smoked for 3 years total  Vaping Use   Vaping status: Never Used  Substance Use Topics   Alcohol use: No    Alcohol/week: 0.0 standard drinks of alcohol   Drug use: No   Allergies Plavix  [clopidogrel  bisulfate], Calcium  channel blockers, Alprazolam, Codeine, Percodan [oxycodone -aspirin ], Statins, and Valium  Review of Systems A thorough review of systems was obtained and all systems are negative except as noted in the HPI and PMH.   Physical Exam Vital Signs  I have reviewed the triage vital signs BP (!) 149/64   Pulse 87   Temp 99.2 F (37.3 C) (Oral)   Resp 16   SpO2 95%  Physical Exam Vitals and nursing note reviewed.  Constitutional:      General: She is not in acute distress.    Appearance: Normal appearance. She is ill-appearing.  HENT:     Head: Normocephalic and atraumatic.     Right Ear: External ear normal.     Left Ear: External ear normal.     Nose: Nose normal.     Mouth/Throat:     Mouth: Mucous membranes are moist.  Eyes:     General: No scleral icterus.       Right eye: No discharge.        Left eye: No discharge.  Cardiovascular:     Rate and Rhythm: Normal rate and regular rhythm.     Pulses: Normal pulses.     Heart sounds: Normal heart sounds.  Pulmonary:      Effort: Pulmonary effort is normal. No respiratory distress.     Breath sounds:  No stridor. Decreased breath sounds and wheezing present.  Abdominal:     General: Abdomen is flat. There is no distension.     Palpations: Abdomen is soft.     Tenderness: There is no abdominal tenderness.  Musculoskeletal:     Cervical back: No rigidity.     Right lower leg: No edema.     Left lower leg: No edema.  Skin:    General: Skin is warm and dry.     Capillary Refill: Capillary refill takes less than 2 seconds.  Neurological:     Mental Status: She is alert.  Psychiatric:        Mood and Affect: Mood normal.        Behavior: Behavior normal. Behavior is cooperative.     ED Results and Treatments Labs (all labs ordered are listed, but only abnormal results are displayed) Labs Reviewed  COMPREHENSIVE METABOLIC PANEL WITH GFR - Abnormal; Notable for the following components:      Result Value   Total Protein 6.4 (*)    All other components within normal limits  CBC WITH DIFFERENTIAL/PLATELET - Abnormal; Notable for the following components:   WBC 3.4 (*)    RDW 16.1 (*)    All other components within normal limits  BRAIN NATRIURETIC PEPTIDE - Abnormal; Notable for the following components:   B Natriuretic Peptide 460.0 (*)    All other components within normal limits  BLOOD GAS, VENOUS - Abnormal; Notable for the following components:   pH, Ven 7.44 (*)    Bicarbonate 29.9 (*)    Acid-Base Excess 5.0 (*)    All other components within normal limits  RESP PANEL BY RT-PCR (RSV, FLU A&B, COVID)  RVPGX2  EXPECTORATED SPUTUM ASSESSMENT W GRAM STAIN, RFLX TO RESP C  TROPONIN I (HIGH SENSITIVITY)  TROPONIN I (HIGH SENSITIVITY)                                                                                                                          Radiology CT Angio Chest/Abd/Pel for Dissection W and/or Wo Contrast Result Date: 09/05/2023 CLINICAL DATA:  Acute aortic syndrome EXAM: CT  ANGIOGRAPHY CHEST, ABDOMEN AND PELVIS TECHNIQUE: Non-contrast CT of the chest was initially obtained. Multidetector CT imaging through the chest, abdomen and pelvis was performed using the standard protocol during bolus administration of intravenous contrast. Multiplanar reconstructed images and MIPs were obtained and reviewed to evaluate the vascular anatomy. RADIATION DOSE REDUCTION: This exam was performed according to the departmental dose-optimization program which includes automated exposure control, adjustment of the mA and/or kV according to patient size and/or use of iterative reconstruction technique. CONTRAST:  OMNIPAQUE  IOHEXOL  350 MG/ML SOLN COMPARISON:  August 22, 2023 February 27, 2023 FINDINGS: CTA CHEST FINDINGS Cardiovascular: Satisfactory opacification of the pulmonary arteries to the segmental level. No evidence of pulmonary embolism. Normal heart size. No pericardial effusion. Ascending aorta measures 3.6 x 3.8 cm. No dissections. Aortic arch and descending thoracic aorta are  normal. Coronary artery calcifications with coronary stents Mediastinum/Nodes: No enlarged mediastinal, hilar, or axillary lymph nodes. Thyroid  gland, trachea, and esophagus demonstrate no significant findings. Lungs/Pleura: Right lower lobe infiltrates with consolidating patchy nodular changes of the peripheral aspect of the right lower lobe lateral and posterior segments correlate with pneumonia. Musculoskeletal: No chest wall abnormality. No acute or significant osseous findings. Review of the MIP images confirms the above findings. CTA ABDOMEN AND PELVIS FINDINGS VASCULAR Aorta: Normal caliber with mild diffuse atheromatous abdominal aorta without aneurysms or dissections. 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 SMA: No occlusion or significant stenosis Renals: No occlusion or significant stenosis IMA: No occlusion or significant stenosis Inflow: Patent  without evidence of aneurysm, dissection, vasculitis or significant stenosis. Veins: No obvious venous abnormality within the limitations of this arterial phase study. Review of the MIP images confirms the above findings. NON-VASCULAR Hepatobiliary: No focal liver abnormality is seen. Status post cholecystectomy. No biliary dilatation. Pancreas: Unremarkable. No pancreatic ductal dilatation or surrounding inflammatory changes. Spleen: Normal in size without focal abnormality. Adrenals/Urinary Tract: Adrenal glands are unremarkable. Kidneys are normal, without renal calculi, focal lesion, or hydronephrosis. Bladder is unremarkable. No change in large cortical cyst lower pole left kidney 5.6 x 5.2 cm. Bosniak 1, No follow-up imaging is recommended. JACR 2018 Feb; 264-273, Management of the Incidental Renal Mass on CT, [employer] 2021; 814-848, Bosniak Classification of Cystic Renal Masses, Version 2019. Stomach/Bowel: Stomach is within normal limits. Appendix appears normal. No evidence of bowel wall thickening, distention, or inflammatory changes. Diffuse diverticulosis sigmoid colon Reproductive: Status post hysterectomy. No adnexal masses. Other: No abdominal wall hernia or abnormality. No abdominopelvic ascites. Musculoskeletal: No fracture is seen. Review of the MIP images confirms the above findings. IMPRESSION: *No evidence of pulmonary embolism. *Right lower lobe pneumonia. *No evidence of aortic aneurysm or dissection. 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 *No acute findings in the abdomen or pelvis. *Diverticulosis sigmoid colon. *Status post cholecystectomy and hysterectomy. *No change in large cortical cyst lower pole left kidney. Bosniak 1, No follow-up imaging is recommended. Electronically Signed   By: Fredrich Jefferson M.D.   On: 09/05/2023 10:08   DG Chest Port 1 View Result Date: 09/05/2023 CLINICAL DATA:  Shortness of breath.  Productive  sputum. EXAM: PORTABLE CHEST 1 VIEW COMPARISON:  06/11/2023 FINDINGS: Cardiomegaly with increased interstitial and hazy airspace prominence at the bases. Possible trace pleural fluid on the right. Artifact from EKG leads. Stable aortic tortuosity. Left shoulder replacement. IMPRESSION: Increased opacity at the bases could be from failure or infection. Chronic cardiomegaly. Electronically Signed   By: Ronnette Coke M.D.   On: 09/05/2023 08:47    Pertinent labs & imaging results that were available during my care of the patient were reviewed by me and considered in my medical decision making (see MDM for details).  Medications Ordered in ED Medications  azithromycin  (ZITHROMAX ) 500 mg in sodium chloride  0.9 % 250 mL IVPB (has no administration in time range)  ipratropium-albuterol  (DUONEB) 0.5-2.5 (3) MG/3ML nebulizer solution 3 mL (has no administration in time range)  ipratropium-albuterol  (DUONEB) 0.5-2.5 (3) MG/3ML nebulizer solution 3 mL (3 mLs Nebulization Given 09/05/23 0908)  methylPREDNISolone  sodium succinate (SOLU-MEDROL ) 125 mg/2 mL injection 125 mg (125 mg Intravenous Given 09/05/23 0835)  cefTRIAXone  (ROCEPHIN ) 1 g in sodium chloride  0.9 % 100 mL IVPB (1 g Intravenous New Bag/Given 09/05/23 0939)  iohexol  (  OMNIPAQUE ) 350 MG/ML injection 100 mL (100 mLs Intravenous Contrast Given 09/05/23 2956)                                                                                                                                     Procedures Procedures  CRITICAL CARE Performed by: Teddi Favors   Total critical care time: 34 minutes  Critical care time was exclusive of separately billable procedures and treating other patients.  Critical care was necessary to treat or prevent imminent or life-threatening deterioration.  Critical care was time spent personally by me on the following activities: development of treatment plan with patient and/or surrogate as well as nursing, discussions  with consultants, evaluation of patient's response to treatment, examination of patient, obtaining history from patient or surrogate, ordering and performing treatments and interventions, ordering and review of laboratory studies, ordering and review of radiographic studies, pulse oximetry and re-evaluation of patient's condition. CAP, COPD exacerbation w/ multiple nebs, hypoxia  (including critical care time)  Medical Decision Making / ED Course    Medical Decision Making:    Jaleesa Cervi Santee is a 88 y.o. female with past medical history as below, significant for aortic aneurysm, COPD, home o2 2L at bedtime, CAD, ischemic cardiomyopathy, LBBB, HFrEF who presents to the ED with complaint of cough/dib, cp.. The complaint involves an extensive differential diagnosis and also carries with it a high risk of complications and morbidity.  Serious etiology was considered. Ddx includes but is not limited to: In my evaluation of this patient's dyspnea my DDx includes, but is not limited to, pneumonia, pulmonary embolism, pneumothorax, pulmonary edema, metabolic acidosis, asthma, COPD, cardiac cause, anemia, anxiety, etc.    Complete initial physical exam performed, notably the patient was in mild resp distress, pulse ox 87% on RA.    Reviewed and confirmed nursing documentation for past medical history, family history, social history.  Vital signs reviewed.     Brief summary:  88 year old female history of COPD, CAD, cardiomyopathy, HFrEF, ascending aortic aneurysm here with cough, dyspnea, chest pain over the past 3 days, progressively worsening.  On arrival she was hypoxic on room air, improved to 95% on 4 L.  Does have some wheezing and coarse breath sounds bilateral.   Clinical Course as of 09/05/23 1020  Thu Sep 05, 2023  1017 Imaging revealing for right sided pneumonia.  No evidence of dissection or large PE.  She has increased oxygen  requirement from baseline, she is requiring 4 L nasal cannula.   On room air her oxygen  saturation is 87%.  Given nebulized breathing treatments, steroids given history of COPD with possible concurrent exacerbation.  Will start Rocephin  azithromycin  for pneumonia.  Recommend admit, pt agreeable. [SG]    Clinical Course User Index [SG] Russella Courts A, DO    Symptoms somewhat improved  Admit [facility]             Additional history  obtained: -Additional history obtained from ems -External records from outside source obtained and reviewed including: Chart review including previous notes, labs, imaging, consultation notes including  Prior ER visit, primary care documentation Cardiology > dr Stann Earnest   Lab Tests: -I ordered, reviewed, and interpreted labs.   The pertinent results include:   Labs Reviewed  COMPREHENSIVE METABOLIC PANEL WITH GFR - Abnormal; Notable for the following components:      Result Value   Total Protein 6.4 (*)    All other components within normal limits  CBC WITH DIFFERENTIAL/PLATELET - Abnormal; Notable for the following components:   WBC 3.4 (*)    RDW 16.1 (*)    All other components within normal limits  BRAIN NATRIURETIC PEPTIDE - Abnormal; Notable for the following components:   B Natriuretic Peptide 460.0 (*)    All other components within normal limits  BLOOD GAS, VENOUS - Abnormal; Notable for the following components:   pH, Ven 7.44 (*)    Bicarbonate 29.9 (*)    Acid-Base Excess 5.0 (*)    All other components within normal limits  RESP PANEL BY RT-PCR (RSV, FLU A&B, COVID)  RVPGX2  EXPECTORATED SPUTUM ASSESSMENT W GRAM STAIN, RFLX TO RESP C  TROPONIN I (HIGH SENSITIVITY)  TROPONIN I (HIGH SENSITIVITY)    Notable for leukopenia, bnp +  EKG   EKG Interpretation Date/Time:    Ventricular Rate:    PR Interval:    QRS Duration:    QT Interval:    QTC Calculation:   R Axis:      Text Interpretation:           Imaging Studies ordered: I ordered imaging studies including cxr, cta  chest/abd/pelv I independently visualized the following imaging with scope of interpretation limited to determining acute life threatening conditions related to emergency care; findings noted above I agree with the radiologist interpretation If any imaging was obtained with contrast I closely monitored patient for any possible adverse reaction a/w contrast administration in the emergency department   Medicines ordered and prescription drug management: Meds ordered this encounter  Medications   ipratropium-albuterol  (DUONEB) 0.5-2.5 (3) MG/3ML nebulizer solution 3 mL   methylPREDNISolone  sodium succinate (SOLU-MEDROL ) 125 mg/2 mL injection 125 mg    IV methylprednisolone  will be converted to either a q12h or q24h frequency with the same total daily dose (TDD).  Ordered Dose: 1 to 125 mg TDD; convert to: TDD q24h.  Ordered Dose: 126 to 250 mg TDD; convert to: TDD div q12h.  Ordered Dose: >250 mg TDD; DAW.   cefTRIAXone  (ROCEPHIN ) 1 g in sodium chloride  0.9 % 100 mL IVPB    Antibiotic Indication::   CAP   azithromycin  (ZITHROMAX ) 500 mg in sodium chloride  0.9 % 250 mL IVPB    Antibiotic Indication::   CAP   iohexol  (OMNIPAQUE ) 350 MG/ML injection 100 mL   ipratropium-albuterol  (DUONEB) 0.5-2.5 (3) MG/3ML nebulizer solution 3 mL    -I have reviewed the patients home medicines and have made adjustments as needed   Consultations Obtained: I requested consultation with the [facility],  and discussed lab and imaging findings as well as pertinent plan    Cardiac Monitoring: The patient was maintained on a cardiac monitor.  I personally viewed and interpreted the cardiac monitored which showed an underlying rhythm of: nsr Continuous pulse oximetry interpreted by myself, 87% on RA.    Social Determinants of Health:  Diagnosis or treatment significantly limited by social determinants of health: former smoker   Reevaluation:  After the interventions noted above, I reevaluated the patient and found  that they have improved  Co morbidities that complicate the patient evaluation  Past Medical History:  Diagnosis Date   Acute on chronic respiratory failure with hypoxia (HCC) 04/24/2018   Anxiety    Atypical chest pain    Bronchospasm    CAD (coronary artery disease)    a. s/p DES to mid-LAD and DES to mid-OM1 in 11/2016   Cervical disc disorder with myelopathy, unspecified cervical region    Cervicalgia 01/19/2009   Qualifier: Diagnosis of  By: Phyllis Breeze MD, Stanley     Chest pain at rest 01/27/2015   Chronic systolic (congestive) heart failure (HCC)    COPD (chronic obstructive pulmonary disease) (HCC)    Coronary artery disease    De Quervain's disease (tenosynovitis) 04/02/2011   Disc disease with myelopathy, cervical    Essential hypertension    Hemorrhoids    Liver mass    Lung, cysts, congenital    Left lung cyst   Myocardial infarction (HCC)    Nephrolithiasis    Embedded   Nonischemic cardiomyopathy (HCC)    LVEF 35-40% 2015   On home O2    Osteoarthritis    Sprain of wrist 08/07/2012   Thoracic ascending aortic aneurysm (HCC)    4.3 cm April 2016      Dispostion: Disposition decision including need for hospitalization was considered, and patient admitted to the hospital.    Final Clinical Impression(s) / ED Diagnoses Final diagnoses:  Community acquired pneumonia of right lower lobe of lung  Hypoxia  Chronic obstructive pulmonary disease with acute exacerbation (HCC)        Teddi Favors, DO 09/05/23 1020

## 2023-09-05 NOTE — Hospital Course (Signed)
 88 year old female with longstanding COPD, home oxygen  dependent 2 L/min nightly, ischemic cardiomyopathy, HFrEF, CAD, aortic aneurysm, LBBB presented to the ED with progressive cough and shortness of breath over the past 3 days requiring increasing her nightly oxygen  to wearing it all day long.  She denies fever and chills but reports productive cough with yellow-green sputum.  She denied fever and chills.  The patient was evaluated in the ED and noted to have suspicious findings on imaging for a right lower lobe pneumonia and clinical exam findings of acute COPD exacerbation.  She is being admitted into the hospital for further management.

## 2023-09-05 NOTE — H&P (Signed)
 History and Physical  [facility]  RHYLEE PUCILLO ZOX:[id number] DOB: Feb 13, 1936 DOA: 09/05/2023  PCP: Roise Cleaver, MD  Patient coming from: Home by [facility]  Level of care: Med-Surg  I have personally briefly reviewed patient's old medical records in [facility]  Chief Complaint: shortness of breath   HPI: BLANNIE SHEDLOCK is a 88 year old female with longstanding COPD, home oxygen  dependent 2 L/min nightly, ischemic cardiomyopathy, HFrEF, CAD, aortic aneurysm, LBBB presented to the ED with progressive cough and shortness of breath over the past 3 days requiring increasing her nightly oxygen  to wearing it all day long.  She denies fever and chills but reports productive cough with yellow-green sputum.  She denied fever and chills.  The patient was evaluated in the ED and noted to have suspicious findings on imaging for a right lower lobe pneumonia and clinical exam findings of acute COPD exacerbation.  She is being admitted into the hospital for further management.   Past Medical History:  Diagnosis Date   Acute on chronic respiratory failure with hypoxia (HCC) 04/24/2018   Anxiety    Atypical chest pain    Bronchospasm    CAD (coronary artery disease)    a. s/p DES to mid-LAD and DES to mid-OM1 in 11/2016   Cervical disc disorder with myelopathy, unspecified cervical region    Cervicalgia 01/19/2009   Qualifier: Diagnosis of  By: Phyllis Breeze MD, Stanley     Chest pain at rest 01/27/2015   Chronic systolic (congestive) heart failure (HCC)    COPD (chronic obstructive pulmonary disease) (HCC)    Coronary artery disease    De Quervain's disease (tenosynovitis) 04/02/2011   Disc disease with myelopathy, cervical    Essential hypertension    Hemorrhoids    Liver mass    Lung, cysts, congenital    Left lung cyst   Myocardial infarction (HCC)    Nephrolithiasis    Embedded   Nonischemic cardiomyopathy (HCC)    LVEF 35-40% 2015   On home O2    Osteoarthritis    Sprain of wrist  08/07/2012   Thoracic ascending aortic aneurysm (HCC)    4.3 cm April 2016    Past Surgical History:  Procedure Laterality Date   Benign breast tumors     CHOLECYSTECTOMY     COLONOSCOPY     COLONOSCOPY N/A 09/22/2014   Procedure: COLONOSCOPY;  Surgeon: Ruby Corporal, MD;  [facility];  Service: Endoscopy;  Laterality: N/A;  830 -- to be done in OR under fluoro   COLONOSCOPY WITH PROPOFOL  N/A 04/16/2023   Procedure: COLONOSCOPY WITH PROPOFOL ;  Surgeon: Urban Garden, MD;  Location: [facility];  Service: Gastroenterology;  Laterality: N/A;  11:00am, asa 3   Complete hysterectomy     CORONARY STENT INTERVENTION N/A 11/23/2016   Procedure: Coronary Stent Intervention;  Surgeon: Swaziland, Peter M, MD;  Location: [facility] INVASIVE CV LAB;  Service: Cardiovascular;  Laterality: N/A;   LEFT HEART CATH AND CORONARY ANGIOGRAPHY N/A 11/23/2016   Procedure: Left Heart Cath and Coronary Angiography;  Surgeon: Swaziland, Peter M, MD;  Location: [facility] INVASIVE CV LAB;  Service: Cardiovascular;  Laterality: N/A;   POLYPECTOMY  04/16/2023   Procedure: POLYPECTOMY INTESTINAL;  Surgeon: Urban Garden, MD;  Location: [facility];  Service: Gastroenterology;;   TONSILLECTOMY AND ADENOIDECTOMY       reports that she quit smoking about 15 years ago. Her smoking use included cigarettes. She started smoking about 46 years ago. She  has a 7.8 pack-year smoking history. She has never used smokeless tobacco. She reports that she does not drink alcohol and does not use drugs.  Allergies  Allergen Reactions   Plavix  [Clopidogrel  Bisulfate] Itching    Severe itching   Calcium  Channel Blockers Other (See Comments)    cramping   Alprazolam Nausea And Vomiting   Codeine Nausea And Vomiting   Percodan [Oxycodone -Aspirin ] Nausea And Vomiting   Statins Other (See Comments)    Muscle cramps   Valium Nausea And Vomiting    Family History  Problem Relation Age of Onset   Heart disease Mother     Aneurysm Father    Lung cancer Brother    Heart disease Sister    Diabetes Brother    Heart disease Brother     Prior to Admission medications   Medication Sig Start Date End Date Taking? Authorizing Provider  albuterol  (VENTOLIN  HFA) 108 (90 Base) MCG/ACT inhaler Inhale 2 puffs into the lungs every 6 (six) hours as needed for wheezing or shortness of breath. 11/20/22   Sood, Vineet, MD  alendronate  (FOSAMAX ) 70 MG tablet Take 1 tablet (70 mg total) by mouth every 7 (seven) days. Take with a full glass of water  on an empty stomach. Do not lay down for at least 2 hours 06/25/23   Roise Cleaver, MD  aspirin  EC 81 MG tablet Take 81 mg by mouth every morning.     [provider]  carvedilol  (COREG ) 3.125 MG tablet Take 1 tablet (3.125 mg total) by mouth 2 (two) times daily with a meal. Patient taking differently: Take 3.125 mg by mouth daily as needed (high bp). 07/30/22   Loyde Rule, MD  cetirizine  (ZYRTEC  ALLERGY) 10 MG tablet Take 1 tablet (10 mg total) by mouth daily. For itching 06/25/23   Roise Cleaver, MD  Cholecalciferol (VITAMIN D ) 50 MCG (2000 UT) tablet Take 1 tablet (2,000 Units total) by mouth daily. 10/22/22   Roise Cleaver, MD  clorazepate  (TRANXENE ) 7.5 MG tablet Take 1 tablet (7.5 mg total) by mouth daily as needed for anxiety. 06/07/23   Roise Cleaver, MD  dicyclomine  (BENTYL ) 10 MG capsule Take 1 capsule (10 mg total) by mouth every 12 (twelve) hours as needed (abdominal pain). 07/18/23   Castaneda Mayorga, Daniel, MD  fenofibrate  (TRICOR ) 48 MG tablet Take 1 tablet (48 mg total) by mouth daily. 08/12/23   Roise Cleaver, MD  fluocinonide  cream (LIDEX ) 0.05 % Apply 1 Application topically 2 (two) times daily. 08/08/23   Roise Cleaver, MD  Fluticasone -Umeclidin-Vilant (TRELEGY ELLIPTA ) 100-62.5-25 MCG/ACT AEPB Inhale 1 puff into the lungs daily in the afternoon. 11/20/22   Sood, Vineet, MD  isosorbide  mononitrate (IMDUR ) 30 MG 24 hr tablet Take 1 tablet (30 mg total) by  mouth daily. 03/13/23 06/11/23  Strader, Dimple Francis, PA-C  linaclotide  (LINZESS ) 145 MCG CAPS capsule Take 1 capsule (145 mcg total) by mouth daily. To regulate bowel movements 05/01/23   Roise Cleaver, MD  ondansetron  (ZOFRAN ) 4 MG tablet Take 1 tablet (4 mg total) by mouth every 8 (eight) hours as needed for nausea or vomiting. 03/12/23   Yevette Hem, FNP  OXYGEN  Inhale 2 L into the lungs at bedtime. Can use in the morning as needed    [provider]  pantoprazole  (PROTONIX ) 40 MG tablet Take 1 tablet (40 mg total) by mouth daily. 03/04/23   Demaris Fillers, MD  rosuvastatin  (CRESTOR ) 20 MG tablet Take 1 tablet (20 mg total) by mouth daily. 11/13/22  Strader, Brittany M, PA-C  sacubitril -valsartan  (ENTRESTO ) 24-26 MG TAKE 1 TABLET BY MOUTH TWICE DAILY. 01/22/23   Loyde Rule, MD  traMADol  (ULTRAM ) 50 MG tablet Take 1 tablet (50 mg total) by mouth every 12 (twelve) hours as needed for moderate pain (pain score 4-6). 05/21/23   Roise Cleaver, MD  triamterene -hydrochlorothiazide (MAXZIDE-25) 37.5-25 MG tablet Take 1 tablet by mouth daily. For blood pressure and fluid Patient taking differently: Take 1 tablet by mouth daily as needed (blood pressure and fluid). 04/03/22   Roise Cleaver, MD    Physical Exam: Vitals:   09/05/23 0945 09/05/23 1000 09/05/23 1015 09/05/23 1030  BP: (!) 152/77 (!) 148/74 (!) 156/85 (!) 154/80  Pulse: (!) 104 95 96 94  Resp: (!) 23 20 (!) 26 19  Temp:      TempSrc:      SpO2: 95% 94% 94% 95%   Constitutional: appears uncomfortable with a raspy productive cough, thick whitish, yellow sputum seen.  Eyes: PERRL, lids and conjunctivae normal.   ENMT: Mucous membranes are moist. Posterior pharynx clear of any exudate or lesions.  Neck: normal, supple, no masses, no thyromegaly Respiratory: accessory muscle usage seen, diffuse wheezing heard with poor air movement.   Cardiovascular: normal s1, s2 sounds, no murmurs / rubs / gallops. No extremity edema. 2+  pedal pulses. No carotid bruits.  Abdomen: no tenderness, no masses palpated. No hepatosplenomegaly. Bowel sounds positive.  Musculoskeletal: no clubbing / cyanosis. No joint deformity upper and lower extremities. Good ROM, no contractures. Normal muscle tone.  Skin: no rashes, lesions, ulcers. No induration Neurologic: CN 2-12 grossly intact. Sensation intact, DTR normal. Strength 5/5 in all 4.  Psychiatric: Normal judgment and insight. Alert and oriented x 3. Normal mood.   Labs on Admission: I have personally reviewed following labs and imaging studies  CBC: Recent Labs  Lab 09/05/23 0816  WBC 3.4*  NEUTROABS 1.7  HGB 12.0  HCT 38.1  MCV 83.7  PLT 209   Basic Metabolic Panel: Recent Labs  Lab 09/05/23 0816  NA 138  K 3.7  CL 105  CO2 24  GLUCOSE 99  BUN 10  CREATININE 0.63  CALCIUM  9.2   GFR: CrCl cannot be calculated (Unknown ideal weight.). Liver Function Tests: Recent Labs  Lab 09/05/23 0816  AST 20  ALT 12  ALKPHOS 55  BILITOT 0.6  PROT 6.4*  ALBUMIN 3.6   No results for input(s): "LIPASE", "AMYLASE" in the last 168 hours. No results for input(s): "AMMONIA" in the last 168 hours. Coagulation Profile: No results for input(s): "INR", "PROTIME" in the last 168 hours. Cardiac Enzymes: No results for input(s): "CKTOTAL", "CKMB", "CKMBINDEX", "TROPONINI" in the last 168 hours. BNP (last 3 results) No results for input(s): "PROBNP" in the last 8760 hours. HbA1C: No results for input(s): "HGBA1C" in the last 72 hours. CBG: No results for input(s): "GLUCAP" in the last 168 hours. Lipid Profile: No results for input(s): "CHOL", "HDL", "LDLCALC", "TRIG", "CHOLHDL", "LDLDIRECT" in the last 72 hours. Thyroid  Function Tests: No results for input(s): "TSH", "T4TOTAL", "FREET4", "T3FREE", "THYROIDAB" in the last 72 hours. Anemia Panel: No results for input(s): "VITAMINB12", "FOLATE", "FERRITIN", "TIBC", "IRON", "RETICCTPCT" in the last 72 hours. Urine  analysis:    Component Value Date/Time   COLORURINE STRAW (A) 02/27/2023 1140   APPEARANCEUR CLEAR 02/27/2023 1140   APPEARANCEUR Clear 01/24/2023 1510   LABSPEC 1.003 (L) 02/27/2023 1140   PHURINE 7.0 02/27/2023 1140   GLUCOSEU NEGATIVE 02/27/2023 1140   HGBUR NEGATIVE  02/27/2023 1140   BILIRUBINUR NEGATIVE 02/27/2023 1140   BILIRUBINUR Negative 01/24/2023 1510   KETONESUR NEGATIVE 02/27/2023 1140   PROTEINUR NEGATIVE 02/27/2023 1140   UROBILINOGEN 0.2 03/30/2021 1513   UROBILINOGEN 0.2 08/20/2013 1610   NITRITE NEGATIVE 02/27/2023 1140   LEUKOCYTESUR TRACE (A) 02/27/2023 1140    Radiological Exams on Admission: CT Angio Chest/Abd/Pel for Dissection W and/or Wo Contrast Result Date: 09/05/2023 CLINICAL DATA:  Acute aortic syndrome EXAM: CT ANGIOGRAPHY CHEST, ABDOMEN AND PELVIS TECHNIQUE: Non-contrast CT of the chest was initially obtained. Multidetector CT imaging through the chest, abdomen and pelvis was performed using the standard protocol during bolus administration of intravenous contrast. Multiplanar reconstructed images and MIPs were obtained and reviewed to evaluate the vascular anatomy. RADIATION DOSE REDUCTION: This exam was performed according to the departmental dose-optimization program which includes automated exposure control, adjustment of the mA and/or kV according to patient size and/or use of iterative reconstruction technique. CONTRAST:  OMNIPAQUE  IOHEXOL  350 MG/ML SOLN COMPARISON:  August 22, 2023 February 27, 2023 FINDINGS: CTA CHEST FINDINGS Cardiovascular: Satisfactory opacification of the pulmonary arteries to the segmental level. No evidence of pulmonary embolism. Normal heart size. No pericardial effusion. Ascending aorta measures 3.6 x 3.8 cm. No dissections. Aortic arch and descending thoracic aorta are normal. Coronary artery calcifications with coronary stents Mediastinum/Nodes: No enlarged mediastinal, hilar, or axillary lymph nodes. Thyroid  gland, trachea,  and esophagus demonstrate no significant findings. Lungs/Pleura: Right lower lobe infiltrates with consolidating patchy nodular changes of the peripheral aspect of the right lower lobe lateral and posterior segments correlate with pneumonia. Musculoskeletal: No chest wall abnormality. No acute or significant osseous findings. Review of the MIP images confirms the above findings. CTA ABDOMEN AND PELVIS FINDINGS VASCULAR Aorta: Normal caliber with mild diffuse atheromatous abdominal aorta without aneurysms or dissections. 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 SMA: No occlusion or significant stenosis Renals: No occlusion or significant stenosis IMA: No occlusion or significant stenosis Inflow: Patent without evidence of aneurysm, dissection, vasculitis or significant stenosis. Veins: No obvious venous abnormality within the limitations of this arterial phase study. Review of the MIP images confirms the above findings. NON-VASCULAR Hepatobiliary: No focal liver abnormality is seen. Status post cholecystectomy. No biliary dilatation. Pancreas: Unremarkable. No pancreatic ductal dilatation or surrounding inflammatory changes. Spleen: Normal in size without focal abnormality. Adrenals/Urinary Tract: Adrenal glands are unremarkable. Kidneys are normal, without renal calculi, focal lesion, or hydronephrosis. Bladder is unremarkable. No change in large cortical cyst lower pole left kidney 5.6 x 5.2 cm. Bosniak 1, No follow-up imaging is recommended. JACR 2018 Feb; 264-273, Management of the Incidental Renal Mass on CT, [employer] 2021; 814-848, Bosniak Classification of Cystic Renal Masses, Version 2019. Stomach/Bowel: Stomach is within normal limits. Appendix appears normal. No evidence of bowel wall thickening, distention, or inflammatory changes. Diffuse diverticulosis sigmoid colon Reproductive: Status post hysterectomy. No adnexal masses. Other: No  abdominal wall hernia or abnormality. No abdominopelvic ascites. Musculoskeletal: No fracture is seen. Review of the MIP images confirms the above findings. IMPRESSION: *No evidence of pulmonary embolism. *Right lower lobe pneumonia. *No evidence of aortic aneurysm or dissection. 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 *No acute findings in the abdomen or pelvis. *Diverticulosis sigmoid colon. *Status post cholecystectomy and hysterectomy. *No change in large cortical cyst lower pole left kidney. Bosniak 1, No follow-up imaging is recommended. Electronically Signed   By: Ernestina Headland  Anette Barb M.D.   On: 09/05/2023 10:08   DG Chest Port 1 View Result Date: 09/05/2023 CLINICAL DATA:  Shortness of breath.  Productive sputum. EXAM: PORTABLE CHEST 1 VIEW COMPARISON:  06/11/2023 FINDINGS: Cardiomegaly with increased interstitial and hazy airspace prominence at the bases. Possible trace pleural fluid on the right. Artifact from EKG leads. Stable aortic tortuosity. Left shoulder replacement. IMPRESSION: Increased opacity at the bases could be from failure or infection. Chronic cardiomegaly. Electronically Signed   By: Ronnette Coke M.D.   On: 09/05/2023 08:47   EKG: Independently reviewed.   Assessment/Plan Principal Problem:   CAP (community acquired pneumonia) Active Problems:   Ascending aortic aneurysm (HCC)   Chronic combined systolic and diastolic CHF (congestive heart failure) (HCC)   Essential hypertension   Anxiety   Chronic pain   COPD exacerbation (HCC)   GERD (gastroesophageal reflux disease)   Nonischemic cardiomyopathy (HCC)   Hypertensive cardiovascular disease   COPD (chronic obstructive pulmonary disease) (HCC)   CAD S/P percutaneous coronary angioplasty   CAD (coronary artery disease)   Chronic stable angina (HCC)   Cognitive impairment   Mixed hyperlipidemia   Chronic fatigue syndrome   Community Acquired pneumonia  RLL  pneumonia  - continue IV ceftriaxone  and azithromycin  - continue supportive measures - wean oxygen  as able - added flutter valve - added bronchodilators - added mucinex  - consider chest physiotherapy if no improvement   Acute COPD exacerbation Chronic hypoxic respiratory failure  - continue IV steroids, antibiotics, scheduled bronchodilators - continue mucinex , flutter valve  Chronic combined systolic/diastolic heart failure - appears well compensated at this time - resumed home medications  GERD - pantoprazole  for GI protection ordered  CAD/cardiomyopathy - resume home cardiac medications  Essential hypertension  - bisoprolol  2.5 mg daily ordered    DVT prophylaxis: enoxaparin    Code Status: Full   Family Communication:   Disposition Plan: anticipate home   Consults called:   Admission status: INP   Level of care: Med-Surg Faustino Hook MD [facility] How to contact the [facility] Attending or Consulting provider 7A - 7P or covering provider during after hours 7P -7A, for this patient?  Check the care team in [facility] and look for a) attending/consulting [facility] provider listed and b) the [facility] team listed Log into [email or web address] and use [place]'s universal password to access. If you do not have the password, please contact the hospital operator. Locate the [facility] provider you are looking for under [facility] and page to a number that you can be directly reached. If you still have difficulty reaching the provider, please page the [facility] (Director on Call) for the Hospitalists listed on amion for assistance.   If 7PM-7AM, please contact night-coverage [email or web address] Password [email or web address]  09/05/2023, 10:45 AM

## 2023-09-05 NOTE — ED Triage Notes (Signed)
 Pt BIB [facility] from home for c/o sob and cough for the last couple of days; pt states she is coughing up green/yellow sputum  Pt states she is having to wear her PRN O2 around the clock for the last 2 days due to increased sob  Pt c/o chest pain that radiates around to her back

## 2023-09-06 DIAGNOSIS — J441 Chronic obstructive pulmonary disease with (acute) exacerbation: Secondary | ICD-10-CM | POA: Diagnosis not present

## 2023-09-06 DIAGNOSIS — J189 Pneumonia, unspecified organism: Secondary | ICD-10-CM | POA: Diagnosis not present

## 2023-09-06 DIAGNOSIS — I10 Essential (primary) hypertension: Secondary | ICD-10-CM

## 2023-09-06 DIAGNOSIS — I5042 Chronic combined systolic (congestive) and diastolic (congestive) heart failure: Secondary | ICD-10-CM | POA: Diagnosis not present

## 2023-09-06 LAB — CBC
HCT: 34.9 % — ABNORMAL LOW (ref 36.0–46.0)
Hemoglobin: 11 g/dL — ABNORMAL LOW (ref 12.0–15.0)
MCH: 26.4 pg (ref 26.0–34.0)
MCHC: 31.5 g/dL (ref 30.0–36.0)
MCV: 83.7 fL (ref 80.0–100.0)
Platelets: 207 10*3/uL (ref 150–400)
RBC: 4.17 MIL/uL (ref 3.87–5.11)
RDW: 16.1 % — ABNORMAL HIGH (ref 11.5–15.5)
WBC: 5.3 10*3/uL (ref 4.0–10.5)
nRBC: 0 % (ref 0.0–0.2)

## 2023-09-06 LAB — BASIC METABOLIC PANEL WITH GFR
Anion gap: 9 (ref 5–15)
BUN: 11 mg/dL (ref 8–23)
CO2: 23 mmol/L (ref 22–32)
Calcium: 8.8 mg/dL — ABNORMAL LOW (ref 8.9–10.3)
Chloride: 104 mmol/L (ref 98–111)
Creatinine, Ser: 0.57 mg/dL (ref 0.44–1.00)
GFR, Estimated: >60 mL/min (ref >60)
Glucose, Bld: 173 mg/dL — ABNORMAL HIGH (ref 70–99)
Potassium: 3.5 mmol/L (ref 3.5–5.1)
Sodium: 136 mmol/L (ref 135–145)

## 2023-09-06 LAB — PROCALCITONIN: Procalcitonin: <0.1 ng/mL

## 2023-09-06 LAB — MAGNESIUM: Magnesium: 2 mg/dL (ref 1.7–2.4)

## 2023-09-06 MED ORDER — ASPIRIN 81 MG PO TBEC
81.0000 mg | DELAYED_RELEASE_TABLET | ORAL | Status: DC
  Administered 2023-09-06 – 2023-09-09 (×4): 81 mg via ORAL
  Filled 2023-09-06 (×4): qty 1

## 2023-09-06 MED ORDER — FENOFIBRATE 54 MG PO TABS
54.0000 mg | ORAL_TABLET | Freq: Every day | ORAL | Status: DC
  Administered 2023-09-06 – 2023-09-09 (×4): 54 mg via ORAL
  Filled 2023-09-06 (×5): qty 1

## 2023-09-06 MED ORDER — DICYCLOMINE HCL 10 MG PO CAPS: 10.0000 mg | ORAL_CAPSULE | Freq: Two times a day (BID) | ORAL | Status: DC | PRN

## 2023-09-06 MED ORDER — POLYETHYLENE GLYCOL 3350 17 G PO PACK
17.0000 g | PACK | Freq: Every day | ORAL | Status: DC
  Administered 2023-09-07: 17 g via ORAL
  Filled 2023-09-06 (×4): qty 1

## 2023-09-06 MED ORDER — SACUBITRIL-VALSARTAN 24-26 MG PO TABS
1.0000 | ORAL_TABLET | Freq: Two times a day (BID) | ORAL | Status: DC
  Administered 2023-09-06 – 2023-09-09 (×7): 1 via ORAL
  Filled 2023-09-06 (×7): qty 1

## 2023-09-06 MED ORDER — CLONAZEPAM 0.25 MG PO TBDP
0.2500 mg | ORAL_TABLET | Freq: Two times a day (BID) | ORAL | Status: DC | PRN
  Administered 2023-09-06 (×2): 0.25 mg via ORAL
  Filled 2023-09-06 (×3): qty 1

## 2023-09-06 MED ORDER — BUDESON-GLYCOPYRROL-FORMOTEROL 160-9-4.8 MCG/ACT IN AERO
2.0000 | INHALATION_SPRAY | Freq: Two times a day (BID) | RESPIRATORY_TRACT | Status: DC
  Administered 2023-09-06 – 2023-09-09 (×7): 2 via RESPIRATORY_TRACT
  Filled 2023-09-06: qty 5.9

## 2023-09-06 NOTE — Plan of Care (Signed)
  Problem: Acute Rehab PT Goals(only PT should resolve) Goal: Pt Will Go Supine/Side To Sit Outcome: Progressing Flowsheets (Taken 09/06/2023 1533) Pt will go Supine/Side to Sit:  Independently  with modified independence Goal: Patient Will Transfer Sit To/From Stand Outcome: Progressing Flowsheets (Taken 09/06/2023 1533) Patient will transfer sit to/from stand: with modified independence Goal: Pt Will Transfer Bed To Chair/Chair To Bed Outcome: Progressing Flowsheets (Taken 09/06/2023 1533) Pt will Transfer Bed to Chair/Chair to Bed: with modified independence Goal: Pt Will Ambulate Outcome: Progressing Flowsheets (Taken 09/06/2023 1533) Pt will Ambulate:  75 feet  with modified independence  with supervision  with rolling walker   3:33 PM, 09/06/23 Walton Guppy, MPT Physical Therapist with [facility] [phone] office [phone] mobile phone

## 2023-09-06 NOTE — Plan of Care (Signed)

## 2023-09-06 NOTE — Evaluation (Signed)
 Physical Therapy Evaluation Patient Details Name: [name] MRN: [id number] DOB: 06/08/1935 Today's Date: 09/06/2023  History of Present Illness  [name] is a 88 year old female with longstanding COPD, home oxygen  dependent 2 L/min nightly, ischemic cardiomyopathy, HFrEF, CAD, aortic aneurysm, LBBB presented to the ED with progressive cough and shortness of breath over the past 3 days requiring increasing her nightly oxygen  to wearing it all day long.  She denies fever and chills but reports productive cough with yellow-green sputum.  She denied fever and chills.  The patient was evaluated in the ED and noted to have suspicious findings on imaging for a right lower lobe pneumonia and clinical exam findings of acute COPD exacerbation.  She is being admitted into the hospital for further management.   Clinical Impression  Patient able to sit up at bedside without assistance, has to lean on armrest of chair during transfer without AD, good return for ambulating in room, hallway using RW without loss of balance and tolerated sitting up in chair after therapy with visitor in room. Patient will benefit from continued skilled physical therapy in hospital and recommended venue below to increase strength, balance, endurance for safe ADLs and gait.         If plan is discharge home, recommend the following: A little help with walking and/or transfers;A little help with bathing/dressing/bathroom;Help with stairs or ramp for entrance;Assistance with cooking/housework   Can travel by private vehicle        Equipment Recommendations None recommended by PT  Recommendations for Other Services       Functional Status Assessment Patient has had a recent decline in their functional status and demonstrates the ability to make significant improvements in function in a reasonable and predictable amount of time.     Precautions / Restrictions Precautions Precautions: Fall Restrictions Weight Bearing  Restrictions Per Provider Order: No      Mobility  Bed Mobility Overal bed mobility: Modified Independent                  Transfers Overall transfer level: Needs assistance Equipment used: None, Rolling walker (2 wheels) Transfers: Sit to/from Stand, Bed to chair/wheelchair/BSC Sit to Stand: Supervision, Contact guard assist   Step pivot transfers: Supervision, Contact guard assist            Ambulation/Gait Ambulation/Gait assistance: Contact guard assist, Supervision Gait Distance (Feet): 55 Feet Assistive device: Rolling walker (2 wheels) Gait Pattern/deviations: Decreased step length - right, Decreased step length - left, Decreased stride length Gait velocity: decreased     General Gait Details: slightly labored movement without loss of balance, limited mostly due to fatigue, on 2 LPM O2 with no c/o of SOB  Stairs            Wheelchair Mobility     Tilt Bed    Modified Rankin (Stroke Patients Only)       Balance Overall balance assessment: Needs assistance Sitting-balance support: Feet supported, No upper extremity supported Sitting balance-Leahy Scale: Good Sitting balance - Comments: seated at EOB   Standing balance support: During functional activity, No upper extremity supported Standing balance-Leahy Scale: Poor Standing balance comment: fair/poor without AD, fair/good using RW                             Pertinent Vitals/Pain Pain Assessment Pain Assessment: No/denies pain    Home Living Family/patient expects to be discharged to:: Private residence Living Arrangements:  Children Available Help at Discharge: Family;Available PRN/intermittently Type of Home: House Home Access: Stairs to enter Entrance Stairs-Rails: None Entrance Stairs-Number of Steps: 1 Alternate Level Stairs-Number of Steps: 12 steps to basement Home Layout: Two level;Laundry or work area in basement;Able to live on main level with  bedroom/bathroom;Full bath on main level Home Equipment: Rolling Walker (2 wheels);Cane - single point;Grab bars - tub/shower;Rollator (4 wheels)      Prior Function Prior Level of Function : Independent/Modified Independent             Mobility Comments: Ambulates without AD, leaning on walls at all time, drives ADLs Comments: Assisted by family     Extremity/Trunk Assessment   Upper Extremity Assessment Upper Extremity Assessment: Overall WFL for tasks assessed    Lower Extremity Assessment Lower Extremity Assessment: Generalized weakness    Cervical / Trunk Assessment Cervical / Trunk Assessment: Kyphotic  Communication   Communication Communication: Impaired Factors Affecting Communication: Hearing impaired    Cognition Arousal: Alert Behavior During Therapy: WFL for tasks assessed/performed   PT - Cognitive impairments: No apparent impairments                         Following commands: Intact       Cueing Cueing Techniques: Verbal cues, Tactile cues     General Comments      Exercises     Assessment/Plan    PT Assessment Patient needs continued PT services  PT Problem List Decreased strength;Decreased activity tolerance;Decreased balance;Decreased mobility       PT Treatment Interventions DME instruction;Gait training;Stair training;Functional mobility training;Therapeutic activities;Therapeutic exercise;Balance training;Patient/family education    PT Goals (Current goals can be found in the Care Plan section)  Acute Rehab PT Goals Patient Stated Goal: return home with family to assist PT Goal Formulation: With patient Time For Goal Achievement: 09/09/23 Potential to Achieve Goals: Good    Frequency Min 3X/week     Co-evaluation               AM-PAC PT "6 Clicks" Mobility  Outcome Measure Help needed turning from your back to your side while in a flat bed without using bedrails?: None Help needed moving from lying on  your back to sitting on the side of a flat bed without using bedrails?: None Help needed moving to and from a bed to a chair (including a wheelchair)?: A Little Help needed standing up from a chair using your arms (e.g., wheelchair or bedside chair)?: None Help needed to walk in hospital room?: A Little Help needed climbing 3-5 steps with a railing? : A Little 6 Click Score: 21    End of Session Equipment Utilized During Treatment: Oxygen  Activity Tolerance: Patient tolerated treatment well;Patient limited by fatigue;Patient limited by lethargy Patient left: in chair;with call bell/phone within reach;with family/visitor present Nurse Communication: Mobility status PT Visit Diagnosis: Unsteadiness on feet (R26.81);Other abnormalities of gait and mobility (R26.89);Muscle weakness (generalized) (M62.81)    Time: 1610-9604 PT Time Calculation (min) (ACUTE ONLY): 23 min   Charges:   PT Evaluation $PT Eval Moderate Complexity: 1 Mod PT Treatments $Therapeutic Activity: 23-37 mins PT General Charges $$ ACUTE PT VISIT: 1 Visit         3:31 PM, 09/06/23 Walton Guppy, MPT Physical Therapist with [facility] [phone] office [phone] mobile phone

## 2023-09-06 NOTE — Progress Notes (Signed)
 PROGRESS NOTE   [name]  WGN:[id number] DOB: 01/27/1936 DOA: 09/05/2023 PCP: Roise Cleaver, MD   Chief Complaint  Patient presents with   Cough   Shortness of Breath   Level of care: Med-Surg  Brief Admission History:  88 year old female with longstanding COPD, home oxygen  dependent 2 L/min nightly, ischemic cardiomyopathy, HFrEF, CAD, aortic aneurysm, LBBB presented to the ED with progressive cough and shortness of breath over the past 3 days requiring increasing her nightly oxygen  to wearing it all day long.  She denies fever and chills but reports productive cough with yellow-green sputum.  She denied fever and chills.  The patient was evaluated in the ED and noted to have suspicious findings on imaging for a right lower lobe pneumonia and clinical exam findings of acute COPD exacerbation.  She is being admitted into the hospital for further management.   Assessment and Plan:  Community Acquired pneumonia  RLL pneumonia  - continue IV ceftriaxone  and azithromycin  - continue supportive measures - wean oxygen  as able - added flutter valve - added bronchodilators to home treatments - added mucinex  - consider chest physiotherapy if no improvement    Acute COPD exacerbation Chronic hypoxic respiratory failure  - continue IV steroids, antibiotics, scheduled bronchodilators - continue mucinex , flutter valve   Chronic combined systolic/diastolic heart failure - appears well compensated at this time - resumed home medications   GERD - pantoprazole  for GI protection ordered   CAD/cardiomyopathy - resumed home cardiac medications   Essential hypertension  - bisoprolol  2.5 mg daily ordered     DVT prophylaxis: enoxaparin  Code Status: Full  Family Communication:  Disposition: anticipating home    Consultants:   Procedures:   Antimicrobials:  CTX/Azithromycin  4/25>>   Subjective: Pt still having a lot of SOB, cough, chest congestion, malaise and weakness.    Objective: Vitals:   09/06/23 0447 09/06/23 0825 09/06/23 0944 09/06/23 1419  BP: (!) 135/57   128/84  Pulse: 91   73  Resp: 18   18  Temp: 98.4 F (36.9 C)   98.3 F (36.8 C)  TempSrc: Oral     SpO2: 94% 92% 93% 93%  Weight:      Height:        Intake/Output Summary (Last 24 hours) at 09/06/2023 1724 Last data filed at 09/06/2023 1500 Gross per 24 hour  Intake 700.06 ml  Output --  Net 700.06 ml   Filed Weights   09/05/23 1542  Weight: 72.2 kg   Examination:  General exam: Appears calm and comfortable  Respiratory system: diffuse insp/exp wheezing, increased work of breathing. Speaking in short sentences. Cardiovascular system: normal S1 & S2 heard. No JVD, murmurs, rubs, gallops or clicks. No pedal edema. Gastrointestinal system: Abdomen is nondistended, soft and nontender. No organomegaly or masses felt. Normal bowel sounds heard. Central nervous system: Alert and oriented. No focal neurological deficits. Extremities: Symmetric 5 x 5 power. Skin: No rashes, lesions or ulcers. Psychiatry: Judgement and insight appear normal. Mood & affect appropriate.   Data Reviewed: I have personally reviewed following labs and imaging studies  CBC: Recent Labs  Lab 09/05/23 0816 09/06/23 0503  WBC 3.4* 5.3  NEUTROABS 1.7  --   HGB 12.0 11.0*  HCT 38.1 34.9*  MCV 83.7 83.7  PLT 209 207    Basic Metabolic Panel: Recent Labs  Lab 09/05/23 0816 09/06/23 0503  NA 138 136  K 3.7 3.5  CL 105 104  CO2 24 23  GLUCOSE 99 173*  BUN 10 11  CREATININE 0.63 0.57  CALCIUM  9.2 8.8*  MG  --  2.0    CBG: No results for input(s): "GLUCAP" in the last 168 hours.  Recent Results (from the past 240 hours)  Resp panel by RT-PCR (RSV, Flu A&B, Covid) Anterior Nasal Swab     Status: None   Collection Time: 09/05/23  8:26 AM   Specimen: Anterior Nasal Swab  Result Value Ref Range Status   SARS Coronavirus 2 by RT PCR NEGATIVE NEGATIVE Final    Comment: (NOTE) SARS-CoV-2  target nucleic acids are NOT DETECTED.  The SARS-CoV-2 RNA is generally detectable in upper respiratory specimens during the acute phase of infection. The lowest concentration of SARS-CoV-2 viral copies this assay can detect is 138 copies/mL. A negative result does not preclude SARS-Cov-2 infection and should not be used as the sole basis for treatment or other patient management decisions. A negative result may occur with  improper specimen collection/handling, submission of specimen other than nasopharyngeal swab, presence of viral mutation(s) within the areas targeted by this assay, and inadequate number of viral copies(<138 copies/mL). A negative result must be combined with clinical observations, patient history, and epidemiological information. The expected result is Negative.  Fact Sheet for Patients:  BloggerCourse.com  Fact Sheet for Healthcare Providers:  SeriousBroker.it  This test is no t yet approved or cleared by the [place]  FDA and  has been authorized for detection and/or diagnosis of SARS-CoV-2 by FDA under an Emergency Use Authorization (EUA). This EUA will remain  in effect (meaning this test can be used) for the duration of the COVID-19 declaration under Section 564(b)(1) of the Act, 21 U.S.C.section 360bbb-3(b)(1), unless the authorization is terminated  or revoked sooner.       Influenza A by PCR NEGATIVE NEGATIVE Final   Influenza B by PCR NEGATIVE NEGATIVE Final    Comment: (NOTE) The Xpert Xpress SARS-CoV-2/FLU/RSV plus assay is intended as an aid in the diagnosis of influenza from Nasopharyngeal swab specimens and should not be used as a sole basis for treatment. Nasal washings and aspirates are unacceptable for Xpert Xpress SARS-CoV-2/FLU/RSV testing.  Fact Sheet for Patients: BloggerCourse.com  Fact Sheet for Healthcare  Providers: SeriousBroker.it  This test is not yet approved or cleared by the [place]  FDA and has been authorized for detection and/or diagnosis of SARS-CoV-2 by FDA under an Emergency Use Authorization (EUA). This EUA will remain in effect (meaning this test can be used) for the duration of the COVID-19 declaration under Section 564(b)(1) of the Act, 21 U.S.C. section 360bbb-3(b)(1), unless the authorization is terminated or revoked.     Resp Syncytial Virus by PCR NEGATIVE NEGATIVE Final    Comment: (NOTE) Fact Sheet for Patients: BloggerCourse.com  Fact Sheet for Healthcare Providers: SeriousBroker.it  This test is not yet approved or cleared by the [place]  FDA and has been authorized for detection and/or diagnosis of SARS-CoV-2 by FDA under an Emergency Use Authorization (EUA). This EUA will remain in effect (meaning this test can be used) for the duration of the COVID-19 declaration under Section 564(b)(1) of the Act, 21 U.S.C. section 360bbb-3(b)(1), unless the authorization is terminated or revoked.  Performed at [facility], [address] [place], [place] [address]      Radiology Studies: CT Angio Chest/Abd/Pel for Dissection W and/or Wo Contrast Result Date: 09/05/2023 CLINICAL DATA:  Acute aortic syndrome EXAM: CT ANGIOGRAPHY CHEST, ABDOMEN AND PELVIS TECHNIQUE: Non-contrast CT of the chest was initially obtained. Multidetector CT  imaging through the chest, abdomen and pelvis was performed using the standard protocol during bolus administration of intravenous contrast. Multiplanar reconstructed images and MIPs were obtained and reviewed to evaluate the vascular anatomy. RADIATION DOSE REDUCTION: This exam was performed according to the departmental dose-optimization program which includes automated exposure control, adjustment of the mA and/or kV according to patient size and/or use of  iterative reconstruction technique. CONTRAST:  OMNIPAQUE  IOHEXOL  350 MG/ML SOLN COMPARISON:  August 22, 2023 February 27, 2023 FINDINGS: CTA CHEST FINDINGS Cardiovascular: Satisfactory opacification of the pulmonary arteries to the segmental level. No evidence of pulmonary embolism. Normal heart size. No pericardial effusion. Ascending aorta measures 3.6 x 3.8 cm. No dissections. Aortic arch and descending thoracic aorta are normal. Coronary artery calcifications with coronary stents Mediastinum/Nodes: No enlarged mediastinal, hilar, or axillary lymph nodes. Thyroid  gland, trachea, and esophagus demonstrate no significant findings. Lungs/Pleura: Right lower lobe infiltrates with consolidating patchy nodular changes of the peripheral aspect of the right lower lobe lateral and posterior segments correlate with pneumonia. Musculoskeletal: No chest wall abnormality. No acute or significant osseous findings. Review of the MIP images confirms the above findings. CTA ABDOMEN AND PELVIS FINDINGS VASCULAR Aorta: Normal caliber with mild diffuse atheromatous abdominal aorta without aneurysms or dissections. 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 SMA: No occlusion or significant stenosis Renals: No occlusion or significant stenosis IMA: No occlusion or significant stenosis Inflow: Patent without evidence of aneurysm, dissection, vasculitis or significant stenosis. Veins: No obvious venous abnormality within the limitations of this arterial phase study. Review of the MIP images confirms the above findings. NON-VASCULAR Hepatobiliary: No focal liver abnormality is seen. Status post cholecystectomy. No biliary dilatation. Pancreas: Unremarkable. No pancreatic ductal dilatation or surrounding inflammatory changes. Spleen: Normal in size without focal abnormality. Adrenals/Urinary Tract: Adrenal glands are unremarkable. Kidneys are normal, without renal calculi,  focal lesion, or hydronephrosis. Bladder is unremarkable. No change in large cortical cyst lower pole left kidney 5.6 x 5.2 cm. Bosniak 1, No follow-up imaging is recommended. JACR 2018 Feb; 264-273, Management of the Incidental Renal Mass on CT, [employer] 2021; 814-848, Bosniak Classification of Cystic Renal Masses, Version 2019. Stomach/Bowel: Stomach is within normal limits. Appendix appears normal. No evidence of bowel wall thickening, distention, or inflammatory changes. Diffuse diverticulosis sigmoid colon Reproductive: Status post hysterectomy. No adnexal masses. Other: No abdominal wall hernia or abnormality. No abdominopelvic ascites. Musculoskeletal: No fracture is seen. Review of the MIP images confirms the above findings. IMPRESSION: *No evidence of pulmonary embolism. *Right lower lobe pneumonia. *No evidence of aortic aneurysm or dissection. 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 *No acute findings in the abdomen or pelvis. *Diverticulosis sigmoid colon. *Status post cholecystectomy and hysterectomy. *No change in large cortical cyst lower pole left kidney. Bosniak 1, No follow-up imaging is recommended. Electronically Signed   By: Fredrich Jefferson M.D.   On: 09/05/2023 10:08   DG Chest Port 1 View Result Date: 09/05/2023 CLINICAL DATA:  Shortness of breath.  Productive sputum. EXAM: PORTABLE CHEST 1 VIEW COMPARISON:  06/11/2023 FINDINGS: Cardiomegaly with increased interstitial and hazy airspace prominence at the bases. Possible trace pleural fluid on the right. Artifact from EKG leads. Stable aortic tortuosity. Left shoulder replacement. IMPRESSION: Increased opacity at the bases could be from failure or infection. Chronic cardiomegaly. Electronically Signed   By: Ronnette Coke M.D.   On: 09/05/2023 08:47  Scheduled Meds:  aspirin  EC  81 mg Oral BH-[id number]   azithromycin   500 mg Oral Daily   bisoprolol   2.5 mg Oral Daily    budeson-glycopyrrolate -formoterol   2 puff Inhalation BID   enoxaparin  (LOVENOX ) injection  40 mg Subcutaneous Q24H   fenofibrate   54 mg Oral Daily   guaiFENesin   600 mg Oral BID   ipratropium-albuterol   3 mL Nebulization Q6H   methylPREDNISolone  (SOLU-MEDROL ) injection  40 mg Intravenous Q12H   pantoprazole   40 mg Oral Q supper   polyethylene glycol  17 g Oral Daily   sacubitril -valsartan   1 tablet Oral BID   Continuous Infusions:  cefTRIAXone  (ROCEPHIN )  IV 2 g (09/06/23 0914)     LOS: 1 day   Time spent: 55 mins  Juliauna Stueve Lincoln Renshaw, MD How to contact the [facility] Attending or Consulting provider 7A - 7P or covering provider during after hours 7P -7A, for this patient?  Check the care team in [facility] and look for a) attending/consulting [facility] provider listed and b) the [facility] team listed Log into [email or web address] to find provider on call.  Locate the [facility] provider you are looking for under [facility] and page to a number that you can be directly reached. If you still have difficulty reaching the provider, please page the [facility] (Director on Call) for the Hospitalists listed on amion for assistance.  09/06/2023, 5:24 PM

## 2023-09-06 NOTE — Care Management Important Message (Signed)
 Important Message  Patient Details  Name: [name] MRN: [id number] Date of Birth: 25-Jun-1935   Important Message Given:  N/A - LOS <3 / Initial given by admissions     [name] 09/06/2023, 2:01 PM

## 2023-09-07 DIAGNOSIS — I10 Essential (primary) hypertension: Secondary | ICD-10-CM | POA: Diagnosis not present

## 2023-09-07 DIAGNOSIS — J189 Pneumonia, unspecified organism: Secondary | ICD-10-CM | POA: Diagnosis not present

## 2023-09-07 DIAGNOSIS — J441 Chronic obstructive pulmonary disease with (acute) exacerbation: Secondary | ICD-10-CM | POA: Diagnosis not present

## 2023-09-07 DIAGNOSIS — I5042 Chronic combined systolic (congestive) and diastolic (congestive) heart failure: Secondary | ICD-10-CM | POA: Diagnosis not present

## 2023-09-07 LAB — CBC
HCT: 35.8 % — ABNORMAL LOW (ref 36.0–46.0)
Hemoglobin: 11.3 g/dL — ABNORMAL LOW (ref 12.0–15.0)
MCH: 26.4 pg (ref 26.0–34.0)
MCHC: 31.6 g/dL (ref 30.0–36.0)
MCV: 83.6 fL (ref 80.0–100.0)
Platelets: 214 10*3/uL (ref 150–400)
RBC: 4.28 MIL/uL (ref 3.87–5.11)
RDW: 16.7 % — ABNORMAL HIGH (ref 11.5–15.5)
WBC: 5.6 10*3/uL (ref 4.0–10.5)
nRBC: 0 % (ref 0.0–0.2)

## 2023-09-07 LAB — BASIC METABOLIC PANEL WITH GFR
Anion gap: 8 (ref 5–15)
BUN: 17 mg/dL (ref 8–23)
CO2: 24 mmol/L (ref 22–32)
Calcium: 8.9 mg/dL (ref 8.9–10.3)
Chloride: 106 mmol/L (ref 98–111)
Creatinine, Ser: 0.66 mg/dL (ref 0.44–1.00)
GFR, Estimated: >60 mL/min (ref >60)
Glucose, Bld: 176 mg/dL — ABNORMAL HIGH (ref 70–99)
Potassium: 4.1 mmol/L (ref 3.5–5.1)
Sodium: 138 mmol/L (ref 135–145)

## 2023-09-07 NOTE — Progress Notes (Signed)
 PROGRESS NOTE   [name]  ZHY:[id number] DOB: 1935-08-09 DOA: 09/05/2023 PCP: Roise Cleaver, MD   Chief Complaint  Patient presents with   Cough   Shortness of Breath   Level of care: Med-Surg  Brief Admission History:  88 year old female with longstanding COPD, home oxygen  dependent 2 L/min nightly, ischemic cardiomyopathy, HFrEF, CAD, aortic aneurysm, LBBB presented to the ED with progressive cough and shortness of breath over the past 3 days requiring increasing her nightly oxygen  to wearing it all day long.  She denies fever and chills but reports productive cough with yellow-green sputum.  She denied fever and chills.  The patient was evaluated in the ED and noted to have suspicious findings on imaging for a right lower lobe pneumonia and clinical exam findings of acute COPD exacerbation.  She is being admitted into the hospital for further management.   Assessment and Plan:  Community Acquired pneumonia  RLL pneumonia  - continue IV ceftriaxone  and azithromycin  - continue supportive measures - wean oxygen  as able - added flutter valve - added bronchodilators to home treatments - added mucinex  - consider chest physiotherapy if no improvement - continue treatments     Acute COPD exacerbation Chronic hypoxic respiratory failure  - continue IV steroids, antibiotics, scheduled bronchodilators - continue mucinex , flutter valve   Chronic combined systolic/diastolic heart failure - appears well compensated at this time - resumed home medications   GERD - pantoprazole  for GI protection ordered   CAD/cardiomyopathy - resumed home cardiac medications   Essential hypertension  - bisoprolol  2.5 mg daily ordered   Chronic abdominal pain Chronic mesenteric ischemia  - outpatient workup by GI concerning for mesenteric ischemia - she was referred by Dr. Sammi Crick to IR to discuss treatment options - she is scheduled to be seen by IR on 09/18/23     DVT prophylaxis:  enoxaparin  Code Status: Full  Family Communication:  Disposition: anticipating home in next 1-2 days    Consultants:   Procedures:   Antimicrobials:  CTX/Azithromycin  4/25>>   Subjective: Pt worried about her chronic abdominal pain and says that she was referred by GI to IR to discuss treatment options.  She is coughing and wheezing persistently but cough remains nonproductive.     Objective: Vitals:   09/07/23 0128 09/07/23 0350 09/07/23 0727 09/07/23 0736  BP:  (!) 119/52    Pulse:  76    Resp:  19    Temp:  (!) 97.3 F (36.3 C)    TempSrc:  Oral    SpO2: 91% 93% 94% 94%  Weight:      Height:        Intake/Output Summary (Last 24 hours) at 09/07/2023 1211 Last data filed at 09/06/2023 1824 Gross per 24 hour  Intake 640.06 ml  Output --  Net 640.06 ml   Filed Weights   09/05/23 1542  Weight: 72.2 kg   Examination:  General exam: Appears calm and comfortable  Respiratory system: diffuse persistent insp/exp wheezing, moderate increased work of breathing. Cardiovascular system: normal S1 & S2 heard. No JVD, murmurs, rubs, gallops or clicks. No pedal edema. Gastrointestinal system: Abdomen is nondistended, soft and tender in the RLQ and LLQ, but no guarding signs. No organomegaly or masses felt. Normal bowel sounds heard. Central nervous system: Alert and oriented. No focal neurological deficits. Extremities: Symmetric 5 x 5 power. Skin: No rashes, lesions or ulcers. Psychiatry: Judgement and insight appear normal. Mood & affect appropriate.   Data Reviewed: I have personally reviewed  following labs and imaging studies  CBC: Recent Labs  Lab 09/05/23 0816 09/06/23 0503 09/07/23 0506  WBC 3.4* 5.3 5.6  NEUTROABS 1.7  --   --   HGB 12.0 11.0* 11.3*  HCT 38.1 34.9* 35.8*  MCV 83.7 83.7 83.6  PLT 209 207 214    Basic Metabolic Panel: Recent Labs  Lab 09/05/23 0816 09/06/23 0503 09/07/23 0506  NA 138 136 138  K 3.7 3.5 4.1  CL 105 104 106  CO2 24 23  24   GLUCOSE 99 173* 176*  BUN 10 11 17   CREATININE 0.63 0.57 0.66  CALCIUM  9.2 8.8* 8.9  MG  --  2.0  --     CBG: No results for input(s): "GLUCAP" in the last 168 hours.  Recent Results (from the past 240 hours)  Resp panel by RT-PCR (RSV, Flu A&B, Covid) Anterior Nasal Swab     Status: None   Collection Time: 09/05/23  8:26 AM   Specimen: Anterior Nasal Swab  Result Value Ref Range Status   SARS Coronavirus 2 by RT PCR NEGATIVE NEGATIVE Final    Comment: (NOTE) SARS-CoV-2 target nucleic acids are NOT DETECTED.  The SARS-CoV-2 RNA is generally detectable in upper respiratory specimens during the acute phase of infection. The lowest concentration of SARS-CoV-2 viral copies this assay can detect is 138 copies/mL. A negative result does not preclude SARS-Cov-2 infection and should not be used as the sole basis for treatment or other patient management decisions. A negative result may occur with  improper specimen collection/handling, submission of specimen other than nasopharyngeal swab, presence of viral mutation(s) within the areas targeted by this assay, and inadequate number of viral copies(<138 copies/mL). A negative result must be combined with clinical observations, patient history, and epidemiological information. The expected result is Negative.  Fact Sheet for Patients:  BloggerCourse.com  Fact Sheet for Healthcare Providers:  SeriousBroker.it  This test is no t yet approved or cleared by the [place]  FDA and  has been authorized for detection and/or diagnosis of SARS-CoV-2 by FDA under an Emergency Use Authorization (EUA). This EUA will remain  in effect (meaning this test can be used) for the duration of the COVID-19 declaration under Section 564(b)(1) of the Act, 21 U.S.C.section 360bbb-3(b)(1), unless the authorization is terminated  or revoked sooner.       Influenza A by PCR NEGATIVE NEGATIVE Final    Influenza B by PCR NEGATIVE NEGATIVE Final    Comment: (NOTE) The Xpert Xpress SARS-CoV-2/FLU/RSV plus assay is intended as an aid in the diagnosis of influenza from Nasopharyngeal swab specimens and should not be used as a sole basis for treatment. Nasal washings and aspirates are unacceptable for Xpert Xpress SARS-CoV-2/FLU/RSV testing.  Fact Sheet for Patients: BloggerCourse.com  Fact Sheet for Healthcare Providers: SeriousBroker.it  This test is not yet approved or cleared by the [place]  FDA and has been authorized for detection and/or diagnosis of SARS-CoV-2 by FDA under an Emergency Use Authorization (EUA). This EUA will remain in effect (meaning this test can be used) for the duration of the COVID-19 declaration under Section 564(b)(1) of the Act, 21 U.S.C. section 360bbb-3(b)(1), unless the authorization is terminated or revoked.     Resp Syncytial Virus by PCR NEGATIVE NEGATIVE Final    Comment: (NOTE) Fact Sheet for Patients: BloggerCourse.com  Fact Sheet for Healthcare Providers: SeriousBroker.it  This test is not yet approved or cleared by the [place]  FDA and has been authorized for detection and/or diagnosis of SARS-CoV-2  by FDA under an Emergency Use Authorization (EUA). This EUA will remain in effect (meaning this test can be used) for the duration of the COVID-19 declaration under Section 564(b)(1) of the Act, 21 U.S.C. section 360bbb-3(b)(1), unless the authorization is terminated or revoked.  Performed at [facility], [address], [place], [place] [address]    Radiology Studies: No results found.  Scheduled Meds:  aspirin  EC  81 mg Oral BH-q7a   azithromycin   500 mg Oral Daily   bisoprolol   2.5 mg Oral Daily   budeson-glycopyrrolate -formoterol   2 puff Inhalation BID   enoxaparin  (LOVENOX ) injection  40 mg Subcutaneous Q24H   fenofibrate    54 mg Oral Daily   guaiFENesin   600 mg Oral BID   ipratropium-albuterol   3 mL Nebulization Q6H   methylPREDNISolone  (SOLU-MEDROL ) injection  40 mg Intravenous Q12H   pantoprazole   40 mg Oral Q supper   polyethylene glycol  17 g Oral Daily   sacubitril -valsartan   1 tablet Oral BID   Continuous Infusions:  cefTRIAXone  (ROCEPHIN )  IV 2 g (09/07/23 0823)    LOS: 2 days   Time spent: 55 mins  Ameia Morency Lincoln Renshaw, MD How to contact the [facility] Attending or Consulting provider 7A - 7P or covering provider during after hours 7P -7A, for this patient?  Check the care team in [facility] and look for a) attending/consulting [facility] provider listed and b) the [facility] team listed Log into [email or web address] to find provider on call.  Locate the [facility] provider you are looking for under [facility] and page to a number that you can be directly reached. If you still have difficulty reaching the provider, please page the [facility] (Director on Call) for the Hospitalists listed on amion for assistance.  09/07/2023, 12:11 PM

## 2023-09-07 NOTE — Plan of Care (Signed)
  Problem: Education: Goal: Knowledge of General Education information will improve Description: Including pain rating scale, medication(s)/side effects and non-pharmacologic comfort measures Outcome: Progressing   Problem: Clinical Measurements: Goal: Diagnostic test results will improve Outcome: Progressing   Problem: Clinical Measurements: Goal: Respiratory complications will improve Outcome: Progressing   Problem: Nutrition: Goal: Adequate nutrition will be maintained Outcome: Progressing   Problem: Coping: Goal: Level of anxiety will decrease Outcome: Progressing

## 2023-09-08 DIAGNOSIS — E782 Mixed hyperlipidemia: Secondary | ICD-10-CM

## 2023-09-08 DIAGNOSIS — J189 Pneumonia, unspecified organism: Secondary | ICD-10-CM | POA: Diagnosis not present

## 2023-09-08 DIAGNOSIS — K551 Chronic vascular disorders of intestine: Secondary | ICD-10-CM | POA: Diagnosis present

## 2023-09-08 DIAGNOSIS — K219 Gastro-esophageal reflux disease without esophagitis: Secondary | ICD-10-CM | POA: Diagnosis not present

## 2023-09-08 DIAGNOSIS — I5042 Chronic combined systolic (congestive) and diastolic (congestive) heart failure: Secondary | ICD-10-CM | POA: Diagnosis not present

## 2023-09-08 MED ORDER — IPRATROPIUM-ALBUTEROL 0.5-2.5 (3) MG/3ML IN SOLN
3.0000 mL | Freq: Three times a day (TID) | RESPIRATORY_TRACT | Status: DC
  Administered 2023-09-09: 3 mL via RESPIRATORY_TRACT
  Filled 2023-09-08: qty 3

## 2023-09-08 MED ORDER — MENTHOL 3 MG MT LOZG
1.0000 | LOZENGE | OROMUCOSAL | Status: DC | PRN
  Administered 2023-09-08: 3 mg via ORAL
  Filled 2023-09-08 (×3): qty 9

## 2023-09-08 NOTE — Progress Notes (Signed)
 PROGRESS NOTE   [name]  LOV:[id number] DOB: 1935-11-26 DOA: 09/05/2023 PCP: Roise Cleaver, MD   Chief Complaint  Patient presents with   Cough   Shortness of Breath   Level of care: Med-Surg  Brief Admission History:  88 year old female with longstanding COPD, home oxygen  dependent 2 L/min nightly, ischemic cardiomyopathy, HFrEF, CAD, aortic aneurysm, LBBB presented to the ED with progressive cough and shortness of breath over the past 3 days requiring increasing her nightly oxygen  to wearing it all day long.  She denies fever and chills but reports productive cough with yellow-green sputum.  She denied fever and chills.  The patient was evaluated in the ED and noted to have suspicious findings on imaging for a right lower lobe pneumonia and clinical exam findings of acute COPD exacerbation.  She is being admitted into the hospital for further management.   Assessment and Plan:  Community Acquired pneumonia  RLL pneumonia  - continue IV ceftriaxone  and azithromycin  - continue supportive measures - wean oxygen  as able - added flutter valve - added bronchodilators to home treatments - added mucinex  - consider chest physiotherapy if no improvement - continue treatments     Acute COPD exacerbation Chronic hypoxic respiratory failure  - continue IV steroids, antibiotics, scheduled bronchodilators - continue mucinex , flutter valve   Chronic combined systolic/diastolic heart failure - appears well compensated at this time - resumed home medications   GERD - pantoprazole  for GI protection ordered   CAD/cardiomyopathy - resumed home cardiac medications   Essential hypertension  - bisoprolol  2.5 mg daily ordered   Chronic abdominal pain Chronic mesenteric ischemia  - outpatient workup by GI concerning for mesenteric ischemia - she was referred by Dr. Sammi Crick to IR to discuss treatment options - she is scheduled to be seen by IR on 09/18/23     DVT prophylaxis:  enoxaparin  Code Status: Full  Family Communication:  Disposition: anticipating home tomorrow     Consultants:   Procedures:   Antimicrobials:  CTX/Azithromycin  4/25>>   Subjective: Pt having abdominal pain symptoms, she is reporting slightly improved breathing today.  No CP.      Objective: Vitals:   09/08/23 0436 09/08/23 0845 09/08/23 1308 09/08/23 1448  BP: (!) 128/51  98/74   Pulse: 71  79   Resp: 18  18   Temp: 98.1 F (36.7 C)  98.5 F (36.9 C)   TempSrc:      SpO2: 92% 93% 92% 92%  Weight:      Height:        Intake/Output Summary (Last 24 hours) at 09/08/2023 1521 Last data filed at 09/08/2023 0450 Gross per 24 hour  Intake 480 ml  Output --  Net 480 ml   Filed Weights   09/05/23 1542  Weight: 72.2 kg   Examination:  General exam: Appears calm and comfortable  Respiratory system: improved but not resolved insp/exp wheezing, moderate increased work of breathing. Cardiovascular system: normal S1 & S2 heard. No JVD, murmurs, rubs, gallops or clicks. No pedal edema. Gastrointestinal system: Abdomen is nondistended, soft and tender in the RLQ and LLQ, but no guarding signs. No organomegaly or masses felt. Normal bowel sounds heard. Central nervous system: Alert and oriented. No focal neurological deficits. Extremities: Symmetric 5 x 5 power. Skin: No rashes, lesions or ulcers. Psychiatry: Judgement and insight appear normal. Mood & affect appropriate.   Data Reviewed: I have personally reviewed following labs and imaging studies  CBC: Recent Labs  Lab 09/05/23 0816 09/06/23  0503 09/07/23 0506  WBC 3.4* 5.3 5.6  NEUTROABS 1.7  --   --   HGB 12.0 11.0* 11.3*  HCT 38.1 34.9* 35.8*  MCV 83.7 83.7 83.6  PLT 209 207 214    Basic Metabolic Panel: Recent Labs  Lab 09/05/23 0816 09/06/23 0503 09/07/23 0506  NA 138 136 138  K 3.7 3.5 4.1  CL 105 104 106  CO2 24 23 24   GLUCOSE 99 173* 176*  BUN 10 11 17   CREATININE 0.63 0.57 0.66  CALCIUM  9.2  8.8* 8.9  MG  --  2.0  --     CBG: No results for input(s): "GLUCAP" in the last 168 hours.  Recent Results (from the past 240 hours)  Resp panel by RT-PCR (RSV, Flu A&B, Covid) Anterior Nasal Swab     Status: None   Collection Time: 09/05/23  8:26 AM   Specimen: Anterior Nasal Swab  Result Value Ref Range Status   SARS Coronavirus 2 by RT PCR NEGATIVE NEGATIVE Final    Comment: (NOTE) SARS-CoV-2 target nucleic acids are NOT DETECTED.  The SARS-CoV-2 RNA is generally detectable in upper respiratory specimens during the acute phase of infection. The lowest concentration of SARS-CoV-2 viral copies this assay can detect is 138 copies/mL. A negative result does not preclude SARS-Cov-2 infection and should not be used as the sole basis for treatment or other patient management decisions. A negative result may occur with  improper specimen collection/handling, submission of specimen other than nasopharyngeal swab, presence of viral mutation(s) within the areas targeted by this assay, and inadequate number of viral copies(<138 copies/mL). A negative result must be combined with clinical observations, patient history, and epidemiological information. The expected result is Negative.  Fact Sheet for Patients:  BloggerCourse.com  Fact Sheet for Healthcare Providers:  SeriousBroker.it  This test is no t yet approved or cleared by the [place]  FDA and  has been authorized for detection and/or diagnosis of SARS-CoV-2 by FDA under an Emergency Use Authorization (EUA). This EUA will remain  in effect (meaning this test can be used) for the duration of the COVID-19 declaration under Section 564(b)(1) of the Act, 21 U.S.C.section 360bbb-3(b)(1), unless the authorization is terminated  or revoked sooner.       Influenza A by PCR NEGATIVE NEGATIVE Final   Influenza B by PCR NEGATIVE NEGATIVE Final    Comment: (NOTE) The Xpert Xpress  SARS-CoV-2/FLU/RSV plus assay is intended as an aid in the diagnosis of influenza from Nasopharyngeal swab specimens and should not be used as a sole basis for treatment. Nasal washings and aspirates are unacceptable for Xpert Xpress SARS-CoV-2/FLU/RSV testing.  Fact Sheet for Patients: BloggerCourse.com  Fact Sheet for Healthcare Providers: SeriousBroker.it  This test is not yet approved or cleared by the [place]  FDA and has been authorized for detection and/or diagnosis of SARS-CoV-2 by FDA under an Emergency Use Authorization (EUA). This EUA will remain in effect (meaning this test can be used) for the duration of the COVID-19 declaration under Section 564(b)(1) of the Act, 21 U.S.C. section 360bbb-3(b)(1), unless the authorization is terminated or revoked.     Resp Syncytial Virus by PCR NEGATIVE NEGATIVE Final    Comment: (NOTE) Fact Sheet for Patients: BloggerCourse.com  Fact Sheet for Healthcare Providers: SeriousBroker.it  This test is not yet approved or cleared by the [place]  FDA and has been authorized for detection and/or diagnosis of SARS-CoV-2 by FDA under an Emergency Use Authorization (EUA). This EUA will remain in effect (  meaning this test can be used) for the duration of the COVID-19 declaration under Section 564(b)(1) of the Act, 21 U.S.C. section 360bbb-3(b)(1), unless the authorization is terminated or revoked.  Performed at [facility], [address] [place], [place] [address]    Radiology Studies: No results found.  Scheduled Meds:  aspirin  EC  81 mg Oral BH-q7a   azithromycin   500 mg Oral Daily   bisoprolol   2.5 mg Oral Daily   budeson-glycopyrrolate -formoterol   2 puff Inhalation BID   enoxaparin  (LOVENOX ) injection  40 mg Subcutaneous Q24H   fenofibrate   54 mg Oral Daily   ipratropium-albuterol   3 mL Nebulization Q6H    methylPREDNISolone  (SOLU-MEDROL ) injection  40 mg Intravenous Q12H   pantoprazole   40 mg Oral Q supper   polyethylene glycol  17 g Oral Daily   sacubitril -valsartan   1 tablet Oral BID   Continuous Infusions:  cefTRIAXone  (ROCEPHIN )  IV 2 g (09/08/23 0816)    LOS: 3 days   Time spent: 53 mins  Richrd Kuzniar Lincoln Renshaw, MD How to contact the [facility] Attending or Consulting provider 7A - 7P or covering provider during after hours 7P -7A, for this patient?  Check the care team in [facility] and look for a) attending/consulting [facility] provider listed and b) the [facility] team listed Log into [email or web address] to find provider on call.  Locate the [facility] provider you are looking for under [facility] and page to a number that you can be directly reached. If you still have difficulty reaching the provider, please page the [facility] (Director on Call) for the Hospitalists listed on amion for assistance.  09/08/2023, 3:21 PM

## 2023-09-08 NOTE — Plan of Care (Signed)
   Problem: Education: Goal: Knowledge of General Education information will improve Description Including pain rating scale, medication(s)/side effects and non-pharmacologic comfort measures Outcome: Progressing   Problem: Health Behavior/Discharge Planning: Goal: Ability to manage health-related needs will improve Outcome: Progressing

## 2023-09-09 DIAGNOSIS — K551 Chronic vascular disorders of intestine: Secondary | ICD-10-CM | POA: Diagnosis not present

## 2023-09-09 DIAGNOSIS — I10 Essential (primary) hypertension: Secondary | ICD-10-CM | POA: Diagnosis not present

## 2023-09-09 DIAGNOSIS — I2510 Atherosclerotic heart disease of native coronary artery without angina pectoris: Secondary | ICD-10-CM

## 2023-09-09 DIAGNOSIS — J441 Chronic obstructive pulmonary disease with (acute) exacerbation: Secondary | ICD-10-CM | POA: Diagnosis not present

## 2023-09-09 DIAGNOSIS — J189 Pneumonia, unspecified organism: Secondary | ICD-10-CM | POA: Diagnosis not present

## 2023-09-09 MED ORDER — ALBUTEROL SULFATE HFA 108 (90 BASE) MCG/ACT IN AERS: 2.0000 | INHALATION_SPRAY | RESPIRATORY_TRACT | 3 refills | Status: AC | PRN

## 2023-09-09 MED ORDER — DOXYCYCLINE HYCLATE 100 MG PO CAPS
100.0000 mg | ORAL_CAPSULE | Freq: Two times a day (BID) | ORAL | 0 refills | Status: AC
Start: 2023-09-10 — End: 2023-09-13

## 2023-09-09 MED ORDER — POLYETHYLENE GLYCOL 3350 17 G PO PACK: 17.0000 g | PACK | Freq: Every day | ORAL | 1 refills | Status: AC | PRN

## 2023-09-09 MED ORDER — PREDNISONE 20 MG PO TABS: ORAL_TABLET | ORAL | 0 refills | Status: DC

## 2023-09-09 MED ORDER — IPRATROPIUM-ALBUTEROL 0.5-2.5 (3) MG/3ML IN SOLN: 3.0000 mL | Freq: Three times a day (TID) | RESPIRATORY_TRACT | 0 refills | Status: AC

## 2023-09-09 MED ORDER — BISOPROLOL FUMARATE 5 MG PO TABS: 2.5000 mg | ORAL_TABLET | Freq: Every day | ORAL | 1 refills | Status: DC

## 2023-09-09 MED ORDER — TRIAMTERENE-HCTZ 37.5-25 MG PO TABS: 1.0000 | ORAL_TABLET | Freq: Every day | ORAL | Status: AC | PRN

## 2023-09-09 NOTE — Discharge Summary (Signed)
 Physician Discharge Summary  [name] ZOX:[id number] DOB: Sep 12, 1935 DOA: 09/05/2023  PCP: Roise Cleaver, MD GI: Sammi Crick  Admit date: 09/05/2023 Discharge date: 09/09/2023  Admitted From:  Home  Disposition: Home   Recommendations for Outpatient Follow-up:  Follow up with PCP in 1 weeks Follow up with IR as scheduled on 09/18/23   Home Health:  PT   Discharge Condition: STABLE   CODE STATUS: FULL DIET: heart healthy foods recommended    Brief Hospitalization Summary: Please see all hospital notes, images, labs for full details of the hospitalization. Admission provider HPI:  88 year old female with longstanding COPD, home oxygen  dependent 2 L/min nightly, ischemic cardiomyopathy, HFrEF, CAD, aortic aneurysm, LBBB presented to the ED with progressive cough and shortness of breath over the past 3 days requiring increasing her nightly oxygen  to wearing it all day long.  She denies fever and chills but reports productive cough with yellow-green sputum.  She denied fever and chills.  The patient was evaluated in the ED and noted to have suspicious findings on imaging for a right lower lobe pneumonia and clinical exam findings of acute COPD exacerbation.  She is being admitted into the hospital for further management.  Hospital Course by problem list   Community Acquired pneumonia  RLL pneumonia  - Pt was treated with IV ceftriaxone  and azithromycin  - treated with supportive measures - remained on chronic supplemental oxygen  - added flutter valve - added bronchodilators to home treatments - added mucinex  - Pt is clinically improved now and eager to go home, DC home on 3 more days of oral doxycycline    Acute COPD exacerbation Chronic hypoxic respiratory failure  - treated with IV steroids, antibiotics, scheduled bronchodilators - treated with mucinex , flutter valve - clinically improved today, DC home on steroid taper, complete abx, bronchodilators   Chronic combined  systolic/diastolic heart failure - appears well compensated at this time - resumed home medications   GERD - pantoprazole  for GI protection ordered   CAD/cardiomyopathy - resumed cardiac medications   Essential hypertension  - bisoprolol  2.5 mg daily ordered due to advanced respiratory compromise, DC home carvedilol    Chronic abdominal pain Chronic mesenteric ischemia  - outpatient workup by GI concerning for mesenteric ischemia - she was referred by Dr. Sammi Crick to IR to discuss treatment options - she is scheduled to be seen by IR on 09/18/23  - follow up with Dr. Sammi Crick with [facility]     Discharge Diagnoses:  Principal Problem:   CAP (community acquired pneumonia) Active Problems:   Ascending aortic aneurysm (HCC)   Chronic combined systolic and diastolic CHF (congestive heart failure) (HCC)   Essential hypertension   Anxiety   Chronic pain   COPD exacerbation (HCC)   GERD (gastroesophageal reflux disease)   Nonischemic cardiomyopathy (HCC)   Hypertensive cardiovascular disease   COPD (chronic obstructive pulmonary disease) (HCC)   CAD S/P percutaneous coronary angioplasty   CAD (coronary artery disease)   Chronic stable angina (HCC)   Cognitive impairment   Mixed hyperlipidemia   Chronic fatigue syndrome   Chronic mesenteric ischemia [facility])  Discharge Instructions:  Allergies as of 09/09/2023       Reactions   Plavix  [clopidogrel  Bisulfate] Itching   Severe itching   Calcium  Channel Blockers Other (See Comments)   cramping   Alprazolam Nausea And Vomiting   Codeine Nausea And Vomiting   Percodan [oxycodone -aspirin ] Nausea And Vomiting   Statins Other (See Comments)   Muscle cramps   Valium Nausea And Vomiting  Medication List     STOP taking these medications    carvedilol  3.125 MG tablet Commonly known as: Coreg        TAKE these medications    albuterol  108 (90 Base) MCG/ACT inhaler Commonly known as: Ventolin  HFA Inhale 2  puffs into the lungs every 4 (four) hours as needed for wheezing or shortness of breath. What changed: when to take this   alendronate  70 MG tablet Commonly known as: FOSAMAX  Take 1 tablet (70 mg total) by mouth every 7 (seven) days. Take with a full glass of water  on an empty stomach. Do not lay down for at least 2 hours   aspirin  EC 81 MG tablet Take 81 mg by mouth every morning.   bisoprolol  5 MG tablet Commonly known as: ZEBETA  Take 0.5 tablets (2.5 mg total) by mouth daily. Start taking on: September 10, 2023   cetirizine  10 MG tablet Commonly known as: ZyrTEC  Allergy Take 1 tablet (10 mg total) by mouth daily. For itching   clorazepate  7.5 MG tablet Commonly known as: TRANXENE  Take 1 tablet (7.5 mg total) by mouth daily as needed for anxiety.   dicyclomine  10 MG capsule Commonly known as: BENTYL  Take 1 capsule (10 mg total) by mouth every 12 (twelve) hours as needed (abdominal pain).   doxycycline  100 MG capsule Commonly known as: VIBRAMYCIN  Take 1 capsule (100 mg total) by mouth 2 (two) times daily for 3 days. Start taking on: September 10, 2023   Entresto  24-26 MG Generic drug: sacubitril -valsartan  TAKE 1 TABLET BY MOUTH TWICE DAILY.   fenofibrate  48 MG tablet Commonly known as: Tricor  Take 1 tablet (48 mg total) by mouth daily.   fluocinonide  cream 0.05 % Commonly known as: LIDEX  Apply 1 Application topically 2 (two) times daily.   ipratropium-albuterol  0.5-2.5 (3) MG/3ML Soln Commonly known as: DUONEB Take 3 mLs by nebulization 3 (three) times daily.   OXYGEN  Inhale 2 L into the lungs at bedtime. Can use in the morning as needed   pantoprazole  40 MG tablet Commonly known as: PROTONIX  Take 1 tablet (40 mg total) by mouth daily.   polyethylene glycol 17 g packet Commonly known as: MIRALAX  / GLYCOLAX  Take 17 g by mouth daily as needed for mild constipation.   predniSONE  20 MG tablet Commonly known as: DELTASONE  Take 3 PO QAM x3days, 2 PO QAM x3days, 1 PO  QAM x3days Start taking on: September 10, 2023   Trelegy Ellipta  100-62.5-25 MCG/ACT Aepb Generic drug: Fluticasone -Umeclidin-Vilant Inhale 1 puff into the lungs daily in the afternoon.   triamterene -hydrochlorothiazide 37.5-25 MG tablet Commonly known as: MAXZIDE-25 Take 1 tablet by mouth daily as needed (blood pressure and fluid).        Follow-up Information     Roise Cleaver, MD Follow up in 1 week(s).   Specialty: Family Medicine Why: Hospital Follow Up Contact information: [address] [place] [address] [phone]                Allergies  Allergen Reactions   Plavix  [Clopidogrel  Bisulfate] Itching    Severe itching   Calcium  Channel Blockers Other (See Comments)    cramping   Alprazolam Nausea And Vomiting   Codeine Nausea And Vomiting   Percodan [Oxycodone -Aspirin ] Nausea And Vomiting   Statins Other (See Comments)    Muscle cramps   Valium Nausea And Vomiting   Allergies as of 09/09/2023       Reactions   Plavix  [clopidogrel  Bisulfate] Itching   Severe itching  Calcium  Channel Blockers Other (See Comments)   cramping   Alprazolam Nausea And Vomiting   Codeine Nausea And Vomiting   Percodan [oxycodone -aspirin ] Nausea And Vomiting   Statins Other (See Comments)   Muscle cramps   Valium Nausea And Vomiting        Medication List     STOP taking these medications    carvedilol  3.125 MG tablet Commonly known as: Coreg        TAKE these medications    albuterol  108 (90 Base) MCG/ACT inhaler Commonly known as: Ventolin  HFA Inhale 2 puffs into the lungs every 4 (four) hours as needed for wheezing or shortness of breath. What changed: when to take this   alendronate  70 MG tablet Commonly known as: FOSAMAX  Take 1 tablet (70 mg total) by mouth every 7 (seven) days. Take with a full glass of water  on an empty stomach. Do not lay down for at least 2 hours   aspirin  EC 81 MG tablet Take 81 mg by mouth every morning.   bisoprolol  5 MG  tablet Commonly known as: ZEBETA  Take 0.5 tablets (2.5 mg total) by mouth daily. Start taking on: September 10, 2023   cetirizine  10 MG tablet Commonly known as: ZyrTEC  Allergy Take 1 tablet (10 mg total) by mouth daily. For itching   clorazepate  7.5 MG tablet Commonly known as: TRANXENE  Take 1 tablet (7.5 mg total) by mouth daily as needed for anxiety.   dicyclomine  10 MG capsule Commonly known as: BENTYL  Take 1 capsule (10 mg total) by mouth every 12 (twelve) hours as needed (abdominal pain).   doxycycline  100 MG capsule Commonly known as: VIBRAMYCIN  Take 1 capsule (100 mg total) by mouth 2 (two) times daily for 3 days. Start taking on: September 10, 2023   Entresto  24-26 MG Generic drug: sacubitril -valsartan  TAKE 1 TABLET BY MOUTH TWICE DAILY.   fenofibrate  48 MG tablet Commonly known as: Tricor  Take 1 tablet (48 mg total) by mouth daily.   fluocinonide  cream 0.05 % Commonly known as: LIDEX  Apply 1 Application topically 2 (two) times daily.   ipratropium-albuterol  0.5-2.5 (3) MG/3ML Soln Commonly known as: DUONEB Take 3 mLs by nebulization 3 (three) times daily.   OXYGEN  Inhale 2 L into the lungs at bedtime. Can use in the morning as needed   pantoprazole  40 MG tablet Commonly known as: PROTONIX  Take 1 tablet (40 mg total) by mouth daily.   polyethylene glycol 17 g packet Commonly known as: MIRALAX  / GLYCOLAX  Take 17 g by mouth daily as needed for mild constipation.   predniSONE  20 MG tablet Commonly known as: DELTASONE  Take 3 PO QAM x3days, 2 PO QAM x3days, 1 PO QAM x3days Start taking on: September 10, 2023   Trelegy Ellipta  100-62.5-25 MCG/ACT Aepb Generic drug: Fluticasone -Umeclidin-Vilant Inhale 1 puff into the lungs daily in the afternoon.   triamterene -hydrochlorothiazide 37.5-25 MG tablet Commonly known as: MAXZIDE-25 Take 1 tablet by mouth daily as needed (blood pressure and fluid).        Procedures/Studies: CT Angio Chest/Abd/Pel for Dissection W  and/or Wo Contrast Result Date: 09/05/2023 CLINICAL DATA:  Acute aortic syndrome EXAM: CT ANGIOGRAPHY CHEST, ABDOMEN AND PELVIS TECHNIQUE: Non-contrast CT of the chest was initially obtained. Multidetector CT imaging through the chest, abdomen and pelvis was performed using the standard protocol during bolus administration of intravenous contrast. Multiplanar reconstructed images and MIPs were obtained and reviewed to evaluate the vascular anatomy. RADIATION DOSE REDUCTION: This exam was performed according to the departmental dose-optimization program which includes  automated exposure control, adjustment of the mA and/or kV according to patient size and/or use of iterative reconstruction technique. CONTRAST:  OMNIPAQUE  IOHEXOL  350 MG/ML SOLN COMPARISON:  August 22, 2023 February 27, 2023 FINDINGS: CTA CHEST FINDINGS Cardiovascular: Satisfactory opacification of the pulmonary arteries to the segmental level. No evidence of pulmonary embolism. Normal heart size. No pericardial effusion. Ascending aorta measures 3.6 x 3.8 cm. No dissections. Aortic arch and descending thoracic aorta are normal. Coronary artery calcifications with coronary stents Mediastinum/Nodes: No enlarged mediastinal, hilar, or axillary lymph nodes. Thyroid  gland, trachea, and esophagus demonstrate no significant findings. Lungs/Pleura: Right lower lobe infiltrates with consolidating patchy nodular changes of the peripheral aspect of the right lower lobe lateral and posterior segments correlate with pneumonia. Musculoskeletal: No chest wall abnormality. No acute or significant osseous findings. Review of the MIP images confirms the above findings. CTA ABDOMEN AND PELVIS FINDINGS VASCULAR Aorta: Normal caliber with mild diffuse atheromatous abdominal aorta without aneurysms or dissections. 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 SMA: No occlusion or significant stenosis Renals: No  occlusion or significant stenosis IMA: No occlusion or significant stenosis Inflow: Patent without evidence of aneurysm, dissection, vasculitis or significant stenosis. Veins: No obvious venous abnormality within the limitations of this arterial phase study. Review of the MIP images confirms the above findings. NON-VASCULAR Hepatobiliary: No focal liver abnormality is seen. Status post cholecystectomy. No biliary dilatation. Pancreas: Unremarkable. No pancreatic ductal dilatation or surrounding inflammatory changes. Spleen: Normal in size without focal abnormality. Adrenals/Urinary Tract: Adrenal glands are unremarkable. Kidneys are normal, without renal calculi, focal lesion, or hydronephrosis. Bladder is unremarkable. No change in large cortical cyst lower pole left kidney 5.6 x 5.2 cm. Bosniak 1, No follow-up imaging is recommended. JACR 2018 Feb; 264-273, Management of the Incidental Renal Mass on CT, [employer] 2021; 814-848, Bosniak Classification of Cystic Renal Masses, Version 2019. Stomach/Bowel: Stomach is within normal limits. Appendix appears normal. No evidence of bowel wall thickening, distention, or inflammatory changes. Diffuse diverticulosis sigmoid colon Reproductive: Status post hysterectomy. No adnexal masses. Other: No abdominal wall hernia or abnormality. No abdominopelvic ascites. Musculoskeletal: No fracture is seen. Review of the MIP images confirms the above findings. IMPRESSION: *No evidence of pulmonary embolism. *Right lower lobe pneumonia. *No evidence of aortic aneurysm or dissection. 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 *No acute findings in the abdomen or pelvis. *Diverticulosis sigmoid colon. *Status post cholecystectomy and hysterectomy. *No change in large cortical cyst lower pole left kidney. Bosniak 1, No follow-up imaging is recommended. Electronically Signed   By: Fredrich Jefferson M.D.   On: 09/05/2023 10:08   DG  Chest Port 1 View Result Date: 09/05/2023 CLINICAL DATA:  Shortness of breath.  Productive sputum. EXAM: PORTABLE CHEST 1 VIEW COMPARISON:  06/11/2023 FINDINGS: Cardiomegaly with increased interstitial and hazy airspace prominence at the bases. Possible trace pleural fluid on the right. Artifact from EKG leads. Stable aortic tortuosity. Left shoulder replacement. IMPRESSION: Increased opacity at the bases could be from failure or infection. Chronic cardiomegaly. Electronically Signed   By: Ronnette Coke M.D.   On: 09/05/2023 08:47   CT Angio Abd/Pel w/ and/or w/o Result Date: 08/29/2023 CLINICAL DATA:  Mesenteric ischemia, chronic EXAM: CTA ABDOMEN AND PELVIS WITHOUT AND WITH CONTRAST TECHNIQUE: Multidetector CT imaging of the abdomen and pelvis was performed using the standard protocol during bolus administration of intravenous contrast. Multiplanar reconstructed images and  MIPs were obtained and reviewed to evaluate the vascular anatomy. RADIATION DOSE REDUCTION: This exam was performed according to the departmental dose-optimization program which includes automated exposure control, adjustment of the mA and/or kV according to patient size and/or use of iterative reconstruction technique. CONTRAST:  OMNIPAQUE  IOHEXOL  350 MG/ML SOLN COMPARISON:  CT of the abdomen pelvis performed February 27, 2023 FINDINGS: VASCULAR Aorta: Mild atherosclerotic changes are present in the abdominal aorta. No abdominal aortic aneurysm. Celiac: There is downward displacement of the celiac axis with stenosis in the proximal segment estimated at greater than 75%. There is poststenotic dilatation. SMA: Mild stenosis in the proximal segment estimated at 45%. Renals: The left renal artery is patent with mild atherosclerotic changes in the proximal segment. The right renal artery is patent with an estimated 60% stenosis in the proximal segment at the ostium. IMA: Patent with mild stenosis in the proximal segment favored. Inflow:  Mild atherosclerotic changes are present bilaterally without evidence of flow-limiting stenosis. Proximal Outflow: Mild atherosclerotic changes are present bilaterally without evidence of flow-limiting stenosis. Veins: Nothing significant. Review of the MIP images confirms the above findings. NON-VASCULAR Lower chest: Enlarged heart. Hepatobiliary: The liver is not significantly changed with possible hemangioma. Cholecystectomy. Pancreas: Nothing significant. Spleen: Nothing significant. Adrenals/Urinary Tract: A low attenuation left renal cyst is present measuring 5.7 cm. No hydronephrosis. The urinary bladder is unchanged. Stomach/Bowel: Colonic diverticulosis. Lymphatic: Nothing significant. Reproductive: Surgically absent uterus. Other: No abdominal wall hernia or abnormality. No abdominopelvic ascites. Musculoskeletal: Degenerative changes with mild anterolisthesis of L4-L5. IMPRESSION: 1. Downward displacement of the celiac axis with stenosis in the proximal segment estimated at 75%. 2. Patent SMA with mild stenosis in the proximal segment estimated 45%. 3. Patent right renal artery with moderate stenosis in the proximal segment estimated at 60%. Electronically Signed   By: Reagan Camera M.D.   On: 08/29/2023 14:02     Subjective: Pt reports she feels well to go home and she is breathing a lot better today.  She reports her chronic abdominal pain has been persistent for several months and she is eager to go see IR to discuss treatment options as scheduled 09/18/23.    Discharge Exam: Vitals:   09/09/23 0746 09/09/23 0747  BP:    Pulse:    Resp:    Temp:    SpO2: 94% 94%   Vitals:   09/08/23 2310 09/09/23 0335 09/09/23 0746 09/09/23 0747  BP:  (!) 142/56    Pulse: 85 81    Resp:  19    Temp:  97.6 F (36.4 C)    TempSrc:  Oral    SpO2: 92% 91% 94% 94%  Weight:      Height:       General: Pt is alert, awake, not in acute distress Cardiovascular: normal S1/S2 +, no rubs, no  gallops Respiratory: bilateral breath sounds with expiratory wheezing heard Abdominal: Soft, NT, ND, bowel sounds + Extremities: no edema, no cyanosis   The results of significant diagnostics from this hospitalization (including imaging, microbiology, ancillary and laboratory) are listed below for reference.     Microbiology: Recent Results (from the past 240 hours)  Resp panel by RT-PCR (RSV, Flu A&B, Covid) Anterior Nasal Swab     Status: None   Collection Time: 09/05/23  8:26 AM   Specimen: Anterior Nasal Swab  Result Value Ref Range Status   SARS Coronavirus 2 by RT PCR NEGATIVE NEGATIVE Final    Comment: (NOTE) SARS-CoV-2 target nucleic acids are  NOT DETECTED.  The SARS-CoV-2 RNA is generally detectable in upper respiratory specimens during the acute phase of infection. The lowest concentration of SARS-CoV-2 viral copies this assay can detect is 138 copies/mL. A negative result does not preclude SARS-Cov-2 infection and should not be used as the sole basis for treatment or other patient management decisions. A negative result may occur with  improper specimen collection/handling, submission of specimen other than nasopharyngeal swab, presence of viral mutation(s) within the areas targeted by this assay, and inadequate number of viral copies(<138 copies/mL). A negative result must be combined with clinical observations, patient history, and epidemiological information. The expected result is Negative.  Fact Sheet for Patients:  BloggerCourse.com  Fact Sheet for Healthcare Providers:  SeriousBroker.it  This test is no t yet approved or cleared by the [place]  FDA and  has been authorized for detection and/or diagnosis of SARS-CoV-2 by FDA under an Emergency Use Authorization (EUA). This EUA will remain  in effect (meaning this test can be used) for the duration of the COVID-19 declaration under Section 564(b)(1) of the  Act, 21 U.S.C.section 360bbb-3(b)(1), unless the authorization is terminated  or revoked sooner.       Influenza A by PCR NEGATIVE NEGATIVE Final   Influenza B by PCR NEGATIVE NEGATIVE Final    Comment: (NOTE) The Xpert Xpress SARS-CoV-2/FLU/RSV plus assay is intended as an aid in the diagnosis of influenza from Nasopharyngeal swab specimens and should not be used as a sole basis for treatment. Nasal washings and aspirates are unacceptable for Xpert Xpress SARS-CoV-2/FLU/RSV testing.  Fact Sheet for Patients: BloggerCourse.com  Fact Sheet for Healthcare Providers: SeriousBroker.it  This test is not yet approved or cleared by the [place]  FDA and has been authorized for detection and/or diagnosis of SARS-CoV-2 by FDA under an Emergency Use Authorization (EUA). This EUA will remain in effect (meaning this test can be used) for the duration of the COVID-19 declaration under Section 564(b)(1) of the Act, 21 U.S.C. section 360bbb-3(b)(1), unless the authorization is terminated or revoked.     Resp Syncytial Virus by PCR NEGATIVE NEGATIVE Final    Comment: (NOTE) Fact Sheet for Patients: BloggerCourse.com  Fact Sheet for Healthcare Providers: SeriousBroker.it  This test is not yet approved or cleared by the [place]  FDA and has been authorized for detection and/or diagnosis of SARS-CoV-2 by FDA under an Emergency Use Authorization (EUA). This EUA will remain in effect (meaning this test can be used) for the duration of the COVID-19 declaration under Section 564(b)(1) of the Act, 21 U.S.C. section 360bbb-3(b)(1), unless the authorization is terminated or revoked.  Performed at [facility], [address], [place], [place] [address]      Labs: BNP (last 3 results) Recent Labs    09/05/23 0816  BNP 460.0*   Basic Metabolic Panel: Recent Labs  Lab 09/05/23 0816  09/06/23 0503 09/07/23 0506  NA 138 136 138  K 3.7 3.5 4.1  CL 105 104 106  CO2 24 23 24   GLUCOSE 99 173* 176*  BUN 10 11 17   CREATININE 0.63 0.57 0.66  CALCIUM  9.2 8.8* 8.9  MG  --  2.0  --    Liver Function Tests: Recent Labs  Lab 09/05/23 0816  AST 20  ALT 12  ALKPHOS 55  BILITOT 0.6  PROT 6.4*  ALBUMIN 3.6   No results for input(s): "LIPASE", "AMYLASE" in the last 168 hours. No results for input(s): "AMMONIA" in the last 168 hours. CBC: Recent Labs  Lab  09/05/23 0816 09/06/23 0503 09/07/23 0506  WBC 3.4* 5.3 5.6  NEUTROABS 1.7  --   --   HGB 12.0 11.0* 11.3*  HCT 38.1 34.9* 35.8*  MCV 83.7 83.7 83.6  PLT 209 207 214   Cardiac Enzymes: No results for input(s): "CKTOTAL", "CKMB", "CKMBINDEX", "TROPONINI" in the last 168 hours. BNP: Invalid input(s): "POCBNP" CBG: No results for input(s): "GLUCAP" in the last 168 hours. D-Dimer No results for input(s): "DDIMER" in the last 72 hours. Hgb A1c No results for input(s): "HGBA1C" in the last 72 hours. Lipid Profile No results for input(s): "CHOL", "HDL", "LDLCALC", "TRIG", "CHOLHDL", "LDLDIRECT" in the last 72 hours. Thyroid  function studies No results for input(s): "TSH", "T4TOTAL", "T3FREE", "THYROIDAB" in the last 72 hours.  Invalid input(s): "FREET3" Anemia work up No results for input(s): "VITAMINB12", "FOLATE", "FERRITIN", "TIBC", "IRON", "RETICCTPCT" in the last 72 hours. Urinalysis    Component Value Date/Time   COLORURINE STRAW (A) 02/27/2023 1140   APPEARANCEUR CLEAR 02/27/2023 1140   APPEARANCEUR Clear 01/24/2023 1510   LABSPEC 1.003 (L) 02/27/2023 1140   PHURINE 7.0 02/27/2023 1140   GLUCOSEU NEGATIVE 02/27/2023 1140   HGBUR NEGATIVE 02/27/2023 1140   BILIRUBINUR NEGATIVE 02/27/2023 1140   BILIRUBINUR Negative 01/24/2023 1510   KETONESUR NEGATIVE 02/27/2023 1140   PROTEINUR NEGATIVE 02/27/2023 1140   UROBILINOGEN 0.2 03/30/2021 1513   UROBILINOGEN 0.2 08/20/2013 1610   NITRITE NEGATIVE  02/27/2023 1140   LEUKOCYTESUR TRACE (A) 02/27/2023 1140   Sepsis Labs Recent Labs  Lab 09/05/23 0816 09/06/23 0503 09/07/23 0506  WBC 3.4* 5.3 5.6   Microbiology Recent Results (from the past 240 hours)  Resp panel by RT-PCR (RSV, Flu A&B, Covid) Anterior Nasal Swab     Status: None   Collection Time: 09/05/23  8:26 AM   Specimen: Anterior Nasal Swab  Result Value Ref Range Status   SARS Coronavirus 2 by RT PCR NEGATIVE NEGATIVE Final    Comment: (NOTE) SARS-CoV-2 target nucleic acids are NOT DETECTED.  The SARS-CoV-2 RNA is generally detectable in upper respiratory specimens during the acute phase of infection. The lowest concentration of SARS-CoV-2 viral copies this assay can detect is 138 copies/mL. A negative result does not preclude SARS-Cov-2 infection and should not be used as the sole basis for treatment or other patient management decisions. A negative result may occur with  improper specimen collection/handling, submission of specimen other than nasopharyngeal swab, presence of viral mutation(s) within the areas targeted by this assay, and inadequate number of viral copies(<138 copies/mL). A negative result must be combined with clinical observations, patient history, and epidemiological information. The expected result is Negative.  Fact Sheet for Patients:  BloggerCourse.com  Fact Sheet for Healthcare Providers:  SeriousBroker.it  This test is no t yet approved or cleared by the [place]  FDA and  has been authorized for detection and/or diagnosis of SARS-CoV-2 by FDA under an Emergency Use Authorization (EUA). This EUA will remain  in effect (meaning this test can be used) for the duration of the COVID-19 declaration under Section 564(b)(1) of the Act, 21 U.S.C.section 360bbb-3(b)(1), unless the authorization is terminated  or revoked sooner.       Influenza A by PCR NEGATIVE NEGATIVE Final    Influenza B by PCR NEGATIVE NEGATIVE Final    Comment: (NOTE) The Xpert Xpress SARS-CoV-2/FLU/RSV plus assay is intended as an aid in the diagnosis of influenza from Nasopharyngeal swab specimens and should not be used as a sole basis for treatment. Nasal washings and aspirates  are unacceptable for Xpert Xpress SARS-CoV-2/FLU/RSV testing.  Fact Sheet for Patients: BloggerCourse.com  Fact Sheet for Healthcare Providers: SeriousBroker.it  This test is not yet approved or cleared by the [place]  FDA and has been authorized for detection and/or diagnosis of SARS-CoV-2 by FDA under an Emergency Use Authorization (EUA). This EUA will remain in effect (meaning this test can be used) for the duration of the COVID-19 declaration under Section 564(b)(1) of the Act, 21 U.S.C. section 360bbb-3(b)(1), unless the authorization is terminated or revoked.     Resp Syncytial Virus by PCR NEGATIVE NEGATIVE Final    Comment: (NOTE) Fact Sheet for Patients: BloggerCourse.com  Fact Sheet for Healthcare Providers: SeriousBroker.it  This test is not yet approved or cleared by the [place]  FDA and has been authorized for detection and/or diagnosis of SARS-CoV-2 by FDA under an Emergency Use Authorization (EUA). This EUA will remain in effect (meaning this test can be used) for the duration of the COVID-19 declaration under Section 564(b)(1) of the Act, 21 U.S.C. section 360bbb-3(b)(1), unless the authorization is terminated or revoked.  Performed at [facility], [address], [place], [place] [address]     Time coordinating discharge:  43 mins   SIGNED:  Faustino Hook, MD  [facility] 09/09/2023, 10:08 AM How to contact the [facility] Attending or Consulting provider 7A - 7P or covering provider during after hours 7P -7A, for this patient?  Check the care team in [facility] and look for  a) attending/consulting TRH provider listed and b) the TRH team listed Log into [email or web address] and use [place]'s universal password to access. If you do not have the password, please contact the hospital operator. Locate the TRH provider you are looking for under [facility] and page to a number that you can be directly reached. If you still have difficulty reaching the provider, please page the [facility] (Director on Call) for the Hospitalists listed on amion for assistance.

## 2023-09-09 NOTE — Care Management Important Message (Signed)
 Important Message  Patient Details  Name: [name] MRN: [id number] Date of Birth: [birth year]   Important Message Given:  Yes - Medicare IM     Luqman Perrelli L Aaran Enberg 09/09/2023, 10:35 AM

## 2023-09-09 NOTE — Plan of Care (Signed)
?  Problem: Education: ?Goal: Knowledge of General Education information will improve ?Description: Including pain rating scale, medication(s)/side effects and non-pharmacologic comfort measures ?Outcome: Progressing ?  ?Problem: Health Behavior/Discharge Planning: ?Goal: Ability to manage health-related needs will improve ?Outcome: Progressing ?  ?Problem: Clinical Measurements: ?Goal: Ability to maintain clinical measurements within normal limits will improve ?Outcome: Progressing ?Goal: Will remain free from infection ?Outcome: Progressing ?  ?Problem: Nutrition: ?Goal: Adequate nutrition will be maintained ?Outcome: Progressing ?  ?Problem: Coping: ?Goal: Level of anxiety will decrease ?Outcome: Progressing ?  ?Problem: Elimination: ?Goal: Will not experience complications related to bowel motility ?Outcome: Progressing ?Goal: Will not experience complications related to urinary retention ?Outcome: Progressing ?  ?Problem: Safety: ?Goal: Ability to remain free from injury will improve ?Outcome: Progressing ?  ?Problem: Skin Integrity: ?Goal: Risk for impaired skin integrity will decrease ?Outcome: Progressing ?  ?

## 2023-09-09 NOTE — TOC Transition Note (Signed)
 Transition of Care [facility]) - Discharge Note   Patient Details  Name: [name] MRN: [id number] Date of Birth: 24-Jul-1935  [facility] CM/SW Contact:  Ander Katos, LCSW Phone Number: 09/09/2023, 10:16 AM   Clinical Narrative: Pt d/c today. Louanna Rouse with [facility] notified. HHPT order in.       Final next level of care: Home w Home Health Services Barriers to Discharge: Barriers Resolved   Patient Goals and CMS Choice Patient states their goals for this hospitalization and ongoing recovery are:: return home   Choice offered to / list presented to : Patient [place] ownership interest in [facility].provided to::  (n/a)    Discharge Placement                       Discharge Plan and Services Additional resources added to the After Visit Summary for                            [facility] Arranged: PT [facility] Agency: [facility] Date [facility] Agency Contacted: 09/09/23 Time HH Agency Contacted: [phone] Representative spoke with at [facility] Agency: Louanna Rouse  Social Drivers of Health (SDOH) Interventions SDOH Screenings   Food Insecurity: No Food Insecurity (09/05/2023)  Housing: Low Risk  (09/05/2023)  Transportation Needs: No Transportation Needs (09/05/2023)  Utilities: Not At Risk (09/05/2023)  Alcohol Screen: Low Risk  (04/23/2023)  Depression (PHQ2-9): High Risk (08/08/2023)  Financial Resource Strain: Low Risk  (04/23/2023)  Physical Activity: Inactive (04/23/2023)  Social Connections: Socially Isolated (09/05/2023)  Stress: No Stress Concern Present (04/23/2023)  Tobacco Use: Medium Risk (09/05/2023)  Health Literacy: Adequate Health Literacy (04/23/2023)     Readmission Risk Interventions    09/05/2023    7:07 PM  Readmission Risk Prevention Plan  Post Dischage Appt Complete  Medication Screening Complete  Transportation Screening Complete

## 2023-09-09 NOTE — Plan of Care (Signed)
   Problem: Education: Goal: Knowledge of General Education information will improve Description Including pain rating scale, medication(s)/side effects and non-pharmacologic comfort measures Outcome: Progressing   Problem: Health Behavior/Discharge Planning: Goal: Ability to manage health-related needs will improve Outcome: Progressing

## 2023-09-09 NOTE — Discharge Instructions (Signed)
IMPORTANT INFORMATION: PAY CLOSE ATTENTION  ? ?PHYSICIAN DISCHARGE INSTRUCTIONS ? ?Follow with Primary care provider  Stacks, Warren, MD  and other consultants as instructed by your Hospitalist Physician ? ?SEEK MEDICAL CARE OR RETURN TO EMERGENCY ROOM IF SYMPTOMS COME BACK, WORSEN OR NEW PROBLEM DEVELOPS  ? ?Please note: ?You were cared for by a hospitalist during your hospital stay. Every effort will be made to forward records to your primary care provider.  You can request that your primary care provider send for your hospital records if they have not received them.  Once you are discharged, your primary care physician will handle any further medical issues. Please note that NO REFILLS for any discharge medications will be authorized once you are discharged, as it is imperative that you return to your primary care physician (or establish a relationship with a primary care physician if you do not have one) for your post hospital discharge needs so that they can reassess your need for medications and monitor your lab values. ? ?Please get a complete blood count and chemistry panel checked by your Primary MD at your next visit, and again as instructed by your Primary MD. ? ?Get Medicines reviewed and adjusted: ?Please take all your medications with you for your next visit with your Primary MD ? ?Laboratory/radiological data: ?Please request your Primary MD to go over all hospital tests and procedure/radiological results at the follow up, please ask your primary care provider to get all Hospital records sent to his/her office. ? ?In some cases, they will be blood work, cultures and biopsy results pending at the time of your discharge. Please request that your primary care provider follow up on these results. ? ?If you are diabetic, please bring your blood sugar readings with you to your follow up appointment with primary care.   ? ?Please call and make your follow up appointments as soon as possible.   ? ?Also Note  the following: ?If you experience worsening of your admission symptoms, develop shortness of breath, life threatening emergency, suicidal or homicidal thoughts you must seek medical attention immediately by calling 911 or calling your MD immediately  if symptoms less severe. ? ?You must read complete instructions/literature along with all the possible adverse reactions/side effects for all the Medicines you take and that have been prescribed to you. Take any new Medicines after you have completely understood and accpet all the possible adverse reactions/side effects.  ? ?Do not drive when taking Pain medications or sleeping medications (Benzodiazepines) ? ?Do not take more than prescribed Pain, Sleep and Anxiety Medications. It is not advisable to combine anxiety,sleep and pain medications without talking with your primary care practitioner ? ?Special Instructions: If you have smoked or chewed Tobacco  in the last 2 yrs please stop smoking, stop any regular Alcohol  and or any Recreational drug use. ? ?Wear Seat belts while driving.  Do not drive if taking any narcotic, mind altering or controlled substances or recreational drugs or alcohol.  ? ? ? ? ? ?

## 2023-09-10 ENCOUNTER — Telehealth: Payer: Self-pay | Admitting: *Deleted

## 2023-09-10 NOTE — Transitions of Care (Post Inpatient/ED Visit) (Signed)
   09/10/2023  Name: [name] MRN: [id number] DOB: 04-11-1936  Today's TOC FU Call Status: Today's TOC FU Call Status:: Unsuccessful Call (1st Attempt) Unsuccessful Call (1st Attempt) Date: 09/10/23  Attempted to reach the patient regarding the most recent Inpatient/ED visit.  Follow Up Plan: Additional outreach attempts will be made to reach the patient to complete the Transitions of Care (Post Inpatient/ED visit) call.   Cecilie Coffee [facility], BSN RN Care Manager/ Transition of Care [place]/ [facility] [phone]

## 2023-09-11 ENCOUNTER — Telehealth: Payer: Self-pay | Admitting: *Deleted

## 2023-09-11 NOTE — Transitions of Care (Post Inpatient/ED Visit) (Signed)
 09/11/2023  Name: [name] MRN: [id number] DOB: 1935-08-10  Today's TOC FU Call Status: Today's TOC FU Call Status:: Successful TOC FU Call Completed TOC FU Call Complete Date: 09/11/23 Patient's Name and Date of Birth confirmed.  Transition Care Management Follow-up Telephone Call Date of Discharge: 09/09/23 Discharge [facility] Type of Discharge: Inpatient Admission Primary Inpatient Discharge Diagnosis:: Community acquired pneumonia How have you been since you were released from the hospital?: Better Any questions or concerns?: No  Items Reviewed: Did you receive and understand the discharge instructions provided?: Yes Medications obtained,verified, and reconciled?: Yes (Medications Reviewed) Any new allergies since your discharge?: No Dietary orders reviewed?: Yes Type of Diet Ordered:: low sodium   heart healthy Do you have support at home?: Yes People in Home [RPT]: child(ren), adult Name of Support/Comfort Primary Source: adult daughter lives with pt  Medications Reviewed Today: Medications Reviewed Today     Reviewed by Daralyn Earl, RN (Registered Nurse) on 09/11/23 at 1433  Med List Status: <None>   Medication Order Taking? Sig Documenting Provider Last Dose Status Informant  albuterol  (VENTOLIN  HFA) 108 (90 Base) MCG/ACT inhaler [id number] Yes Inhale 2 puffs into the lungs every 4 (four) hours as needed for wheezing or shortness of breath. Rayfield Cairo, MD Taking Active   alendronate  (FOSAMAX ) 70 MG tablet [id number] Yes Take 1 tablet (70 mg total) by mouth every 7 (seven) days. Take with a full glass of water  on an empty stomach. Do not lay down for at least 2 hours Roise Cleaver, MD Taking Active Self  aspirin  EC 81 MG tablet [id number] Yes Take 81 mg by mouth every morning.  [provider] Taking Active Self           Med Note Author Board, ANGELICA G   Thu Feb 28, 2023  2:11 PM)    bisoprolol  (ZEBETA ) 5 MG tablet [id number] Yes Take  0.5 tablets (2.5 mg total) by mouth daily. Rayfield Cairo, MD Taking Active   cetirizine  (ZYRTEC  ALLERGY) 10 MG tablet [id number] Yes Take 1 tablet (10 mg total) by mouth daily. For itching Roise Cleaver, MD Taking Active Self  clorazepate  (TRANXENE ) 7.5 MG tablet [id number] Yes Take 1 tablet (7.5 mg total) by mouth daily as needed for anxiety. Roise Cleaver, MD Taking Active Self  dicyclomine  (BENTYL ) 10 MG capsule [id number] Yes Take 1 capsule (10 mg total) by mouth every 12 (twelve) hours as needed (abdominal pain). Castaneda Mayorga, Bearl Limes, MD Taking Active Self  doxycycline  (VIBRAMYCIN ) 100 MG capsule [id number] Yes Take 1 capsule (100 mg total) by mouth 2 (two) times daily for 3 days. Rayfield Cairo, MD Taking Active   fenofibrate  (TRICOR ) 48 MG tablet [id number] Yes Take 1 tablet (48 mg total) by mouth daily. Roise Cleaver, MD Taking Active Self  fluocinonide  cream (LIDEX ) 0.05 % [id number] Yes Apply 1 Application topically 2 (two) times daily. Roise Cleaver, MD Taking Active Self  Fluticasone -Umeclidin-Vilant (TRELEGY ELLIPTA ) 100-62.5-25 MCG/ACT AEPB [id number] Yes Inhale 1 puff into the lungs daily in the afternoon. Sood, Vineet, MD Taking Active Self  ipratropium-albuterol  (DUONEB) 0.5-2.5 (3) MG/3ML SOLN [id number] Yes Take 3 mLs by nebulization 3 (three) times daily. Rayfield Cairo, MD Taking Active   OXYGEN  [id number] Yes Inhale 2 L into the lungs at bedtime. Can use in the morning as needed [provider] Taking Active Self           Med Note [facility], CARLOS A   Fri Aug 25, 2021  10:48 AM)    pantoprazole  (PROTONIX ) 40 MG tablet [id number] Yes Take 1 tablet (40 mg total) by mouth daily. Demaris Fillers, MD Taking Active Self  polyethylene glycol (MIRALAX  / GLYCOLAX ) 17 g packet [id number] Yes Take 17 g by mouth daily as needed for mild constipation. Rayfield Cairo, MD Taking Active   predniSONE  (DELTASONE ) 20 MG tablet [id number] Yes Take 3 PO QAM x3days, 2 PO  QAM x3days, 1 PO QAM x3days Johnson, Clanford L, MD Taking Active   sacubitril -valsartan  (ENTRESTO ) 24-26 MG [id number] Yes TAKE 1 TABLET BY MOUTH TWICE DAILY. Nishan, Peter C, MD Taking Active Self  triamterene -hydrochlorothiazide (MAXZIDE-25) 37.5-25 MG tablet [id number] Yes Take 1 tablet by mouth daily as needed (blood pressure and fluid). Rayfield Cairo, MD Taking Active             Home Care and Equipment/Supplies: Were Home Health Services Ordered?: Yes Name of Home Health Agency:: [phone] Has Agency set up a time to come to your home?: Yes (pt states home health called and she declined, has too much going on and does not want anyone in her home) Any new equipment or medical supplies ordered?: No  Functional Questionnaire: Do you need assistance with bathing/showering or dressing?: Yes (shower seat ,   grab bars) Do you need assistance with meal preparation?: No Do you need assistance with eating?: No Do you have difficulty maintaining continence: No Do you need assistance with getting out of bed/getting out of a chair/moving?: Yes (has walker and cane) Do you have difficulty managing or taking your medications?: Yes (daughter assists)  Follow up appointments reviewed: PCP Follow-up appointment confirmed?: Yes Date of PCP follow-up appointment?: 09/19/23 Follow-up Provider: Dr. Vallorie Gayer [facility] Follow-up appointment confirmed?: Yes Date of Specialist follow-up appointment?: 09/18/23 Follow-Up Specialty Provider:: Interventional Radiology Do you need transportation to your follow-up appointment?: No Do you understand care options if your condition(s) worsen?: Yes-patient verbalized understanding  SDOH Interventions Today    Flowsheet Row Most Recent Value  SDOH Interventions   Food Insecurity Interventions Intervention Not Indicated  Housing Interventions Intervention Not Indicated  Transportation Interventions Intervention Not Indicated  Utilities  Interventions Intervention Not Indicated       Cecilie Coffee [facility], BSN RN Care Manager/ Transition of Care [place]/ [facility] Population Health [phone]

## 2023-09-17 NOTE — Progress Notes (Signed)
 Chief Complaint: Patient was seen in consultation today for chronic abdominal pain; mesenteric ischemia.   Referring Physician(s): Castaneda Mayorga,Daniel  History of Present Illness: [name] is an 88 y.o. female with a medical history significant for CAD (s/p stent placement), COPD, heart failure and anxiety. She was briefly hospitalized April 2025 for pneumonia and COPD exacerbation.   She also has a history of chronic abdominal pain and has been undergoing work up with GI. Her pain is persistent and primarily occurs after she has eaten a solid meal. She is afraid to eat solids and has been on a mostly liquid diet. This has naturally led to weight loss. Previous imaging and a colonoscopy were unremarkable. She was started on bentyl  last year to help with pain management.   Given her post-prandial symptoms her GI doctor ordered a CTA of the abdomen/pelvis. This imaging study was performed 08/22/23 and showed a 75% stenosis in the proximal segment of the celiac axis. Imaging also showed a patent SMA with mild stenosis in the proximal segment estimated at 45%.   The patient has been kindly referred to Interventional Radiology to discuss potential treatment options for mesenteric ischemia.    She endorses post prandial abdominal pain primarily in the right lower quadrant.  She lost approximately 25 lbs in the past month.  She is avoiding solid foods due to the pain.  She has a cough today stating that she is still getting over a pneumonia infection, following up with her PCP tomorrow.  Past Medical History:  Diagnosis Date   Acute on chronic respiratory failure with hypoxia (HCC) 04/24/2018   Anxiety    Atypical chest pain    Bronchospasm    CAD (coronary artery disease)    a. s/p DES to mid-LAD and DES to mid-OM1 in 11/2016   Cervical disc disorder with myelopathy, unspecified cervical region    Cervicalgia 01/19/2009   Qualifier: Diagnosis of  By: Phyllis Breeze MD, Stanley      Chest pain at rest 01/27/2015   Chronic systolic (congestive) heart failure (HCC)    COPD (chronic obstructive pulmonary disease) (HCC)    Coronary artery disease    De Quervain's disease (tenosynovitis) 04/02/2011   Disc disease with myelopathy, cervical    Essential hypertension    Hemorrhoids    Liver mass    Lung, cysts, congenital    Left lung cyst   Myocardial infarction (HCC)    Nephrolithiasis    Embedded   Nonischemic cardiomyopathy (HCC)    LVEF 35-40% 2015   On home O2    Osteoarthritis    Sprain of wrist 08/07/2012   Thoracic ascending aortic aneurysm (HCC)    4.3 cm April 2016    Past Surgical History:  Procedure Laterality Date   Benign breast tumors     CHOLECYSTECTOMY     COLONOSCOPY     COLONOSCOPY N/A 09/22/2014   Procedure: COLONOSCOPY;  Surgeon: Ruby Corporal, MD;  Location: [facility];  Service: Endoscopy;  Laterality: N/A;  830 -- to be done in OR under fluoro   COLONOSCOPY WITH PROPOFOL  N/A 04/16/2023   Procedure: COLONOSCOPY WITH PROPOFOL ;  Surgeon: Urban Garden, MD;  Location: [facility];  Service: Gastroenterology;  Laterality: N/A;  11:00am, asa 3   Complete hysterectomy     CORONARY STENT INTERVENTION N/A 11/23/2016   Procedure: Coronary Stent Intervention;  Surgeon: Swaziland, Peter M, MD;  Location: [facility] INVASIVE CV LAB;  Service: Cardiovascular;  Laterality: N/A;  LEFT HEART CATH AND CORONARY ANGIOGRAPHY N/A 11/23/2016   Procedure: Left Heart Cath and Coronary Angiography;  Surgeon: Swaziland, Peter M, MD;  Location: [facility] INVASIVE CV LAB;  Service: Cardiovascular;  Laterality: N/A;   POLYPECTOMY  04/16/2023   Procedure: POLYPECTOMY INTESTINAL;  Surgeon: Urban Garden, MD;  Location: [facility];  Service: Gastroenterology;;   TONSILLECTOMY AND ADENOIDECTOMY      Allergies: Plavix  [clopidogrel  bisulfate], Calcium  channel blockers, Alprazolam, Codeine, Percodan [oxycodone -aspirin ], Statins, and Valium  Medications: Prior to  Admission medications   Medication Sig Start Date End Date Taking? Authorizing Provider  albuterol  (VENTOLIN  HFA) 108 (90 Base) MCG/ACT inhaler Inhale 2 puffs into the lungs every 4 (four) hours as needed for wheezing or shortness of breath. 09/09/23   Johnson, Clanford L, MD  alendronate  (FOSAMAX ) 70 MG tablet Take 1 tablet (70 mg total) by mouth every 7 (seven) days. Take with a full glass of water  on an empty stomach. Do not lay down for at least 2 hours 06/25/23   Roise Cleaver, MD  aspirin  EC 81 MG tablet Take 81 mg by mouth every morning.     [provider]  bisoprolol  (ZEBETA ) 5 MG tablet Take 0.5 tablets (2.5 mg total) by mouth daily. 09/10/23   Johnson, Clanford L, MD  cetirizine  (ZYRTEC  ALLERGY) 10 MG tablet Take 1 tablet (10 mg total) by mouth daily. For itching 06/25/23   Roise Cleaver, MD  clorazepate  (TRANXENE ) 7.5 MG tablet Take 1 tablet (7.5 mg total) by mouth daily as needed for anxiety. 06/07/23   Roise Cleaver, MD  dicyclomine  (BENTYL ) 10 MG capsule Take 1 capsule (10 mg total) by mouth every 12 (twelve) hours as needed (abdominal pain). 07/18/23   Castaneda Mayorga, Daniel, MD  fenofibrate  (TRICOR ) 48 MG tablet Take 1 tablet (48 mg total) by mouth daily. 08/12/23   Roise Cleaver, MD  fluocinonide  cream (LIDEX ) 0.05 % Apply 1 Application topically 2 (two) times daily. 08/08/23   Roise Cleaver, MD  Fluticasone -Umeclidin-Vilant (TRELEGY ELLIPTA ) 100-62.5-25 MCG/ACT AEPB Inhale 1 puff into the lungs daily in the afternoon. 11/20/22   Sood, Vineet, MD  ipratropium-albuterol  (DUONEB) 0.5-2.5 (3) MG/3ML SOLN Take 3 mLs by nebulization 3 (three) times daily. 09/09/23   Johnson, Clanford L, MD  OXYGEN  Inhale 2 L into the lungs at bedtime. Can use in the morning as needed    [provider]  pantoprazole  (PROTONIX ) 40 MG tablet Take 1 tablet (40 mg total) by mouth daily. 03/04/23   Demaris Fillers, MD  polyethylene glycol (MIRALAX  / GLYCOLAX ) 17 g packet Take 17 g by mouth daily as  needed for mild constipation. 09/09/23   Johnson, Clanford L, MD  predniSONE  (DELTASONE ) 20 MG tablet Take 3 PO QAM x3days, 2 PO QAM x3days, 1 PO QAM x3days 09/10/23   Johnson, Clanford L, MD  sacubitril -valsartan  (ENTRESTO ) 24-26 MG TAKE 1 TABLET BY MOUTH TWICE DAILY. 01/22/23   Loyde Rule, MD  triamterene -hydrochlorothiazide (MAXZIDE-25) 37.5-25 MG tablet Take 1 tablet by mouth daily as needed (blood pressure and fluid). 09/09/23   Rayfield Cairo, MD     Family History  Problem Relation Age of Onset   Heart disease Mother    Aneurysm Father    Lung cancer Brother    Heart disease Sister    Diabetes Brother    Heart disease Brother     Social History   Socioeconomic History   Marital status: Widowed    Spouse name: Not on file   Number of  children: Not on file   Years of education: 9th   Highest education level: Not on file  Occupational History    Employer: RETIRED  Tobacco Use   Smoking status: Former    Current packs/day: 0.00    Average packs/day: 0.3 packs/day for 31.0 years (7.8 ttl pk-yrs)    Types: Cigarettes    Start date: 05/14/1977    Quit date: 05/28/2008    Years since quitting: 15.3   Smokeless tobacco: Never   Tobacco comments:    patient states she only smoked for 3 years total  Vaping Use   Vaping status: Never Used  Substance and Sexual Activity   Alcohol use: No    Alcohol/week: 0.0 standard drinks of alcohol   Drug use: No   Sexual activity: Not Currently    Birth control/protection: Surgical    Comment: hyst  Other Topics Concern   Not on file  Social History Narrative   Not on file   Social Drivers of Health   Financial Resource Strain: Low Risk  (04/23/2023)   Overall Financial Resource Strain (CARDIA)    Difficulty of Paying Living Expenses: Not hard at all  Food Insecurity: No Food Insecurity (09/11/2023)   Hunger Vital Sign    Worried About Running Out of Food in the Last Year: Never true    Ran Out of Food in the Last Year:  Never true  Transportation Needs: No Transportation Needs (09/11/2023)   PRAPARE - Administrator, Civil Service (Medical): No    Lack of Transportation (Non-Medical): No  Physical Activity: Inactive (04/23/2023)   Exercise Vital Sign    Days of Exercise per Week: 0 days    Minutes of Exercise per Session: 0 min  Stress: No Stress Concern Present (04/23/2023)   [employer] of Occupational Health - Occupational Stress Questionnaire    Feeling of Stress : Not at all  Social Connections: Socially Isolated (09/05/2023)   Social Connection and Isolation Panel [NHANES]    Frequency of Communication with Friends and Family: More than three times a week    Frequency of Social Gatherings with Friends and Family: More than three times a week    Attends Religious Services: Never    Database administrator or Organizations: No    Attends Banker Meetings: Never    Marital Status: Widowed    Review of Systems: A 12 point ROS discussed and pertinent positives are indicated in the HPI above.  All other systems are negative.  Vital Signs: There were no vitals taken for this visit.  Advance Care Plan: The advanced care plan/surrogate decision maker was discussed at the time of visit and documented in the medical record.    Physical Exam Constitutional:      General: She is not in acute distress. HENT:     Head: Normocephalic.     Mouth/Throat:     Mouth: Mucous membranes are moist.  Eyes:     General: No scleral icterus. Cardiovascular:     Rate and Rhythm: Normal rate and regular rhythm.  Pulmonary:     Effort: Pulmonary effort is normal. No respiratory distress.  Abdominal:     General: There is no distension.     Tenderness: There is no abdominal tenderness.  Musculoskeletal:     Right lower leg: No edema.     Left lower leg: No edema.  Skin:    General: Skin is warm and dry.     Coloration: Skin  is not jaundiced.  Neurological:     Mental Status: She  is alert and oriented to person, place, and time.     Imaging: CTA CAP 09/05/23  ~75% celiac, ~50% SMA stenosis  Labs:  CBC: Recent Labs    08/08/23 1028 09/05/23 0816 09/06/23 0503 09/07/23 0506  WBC 6.7 3.4* 5.3 5.6  HGB 13.3 12.0 11.0* 11.3*  HCT 41.5 38.1 34.9* 35.8*  PLT 330 209 207 214    COAGS: No results for input(s): "INR", "APTT" in the last 8760 hours.  BMP: Recent Labs    06/11/23 1640 08/08/23 1028 09/05/23 0816 09/06/23 0503 09/07/23 0506  NA 141 143 138 136 138  K 3.7 5.1 3.7 3.5 4.1  CL 102 103 105 104 106  CO2 27 25 24 23 24   GLUCOSE 135* 93 99 173* 176*  BUN 12 8 10 11 17   CALCIUM  10.2 11.0* 9.2 8.8* 8.9  CREATININE 0.64 0.73 0.63 0.57 0.66  GFRNONAA >60  --  >60 >60 >60    LIVER FUNCTION TESTS: Recent Labs    03/12/23 1223 04/22/23 1056 08/08/23 1028 09/05/23 0816  BILITOT 0.3 0.5 0.7 0.6  AST 14 20 22 20   ALT 7 10 11 12   ALKPHOS 49 64 81 55  PROT 6.8 6.4 6.8 6.4*  ALBUMIN 4.4 4.3 4.5 3.6    TUMOR MARKERS: No results for input(s): "AFPTM", "CEA", "CA199", "CHROMGRNA" in the last 8760 hours.  Assessment and Plan: 88 year old female with a history of abdominal pain secondary to chronic mesenteric ischemia with severe celiac and moderate SMA ostial stenoses secondary to fibrofatty atherosclerotic plaques.   We discussed the rationale and expected outcomes of mesensteric angiogram with celiac artery stent placement.  She is amenable and wishes to proceed.  Plan for mesenteric angiogram and possible celiac stent placement at [facility] with moderate sedation via right femoral artery approach.  Creasie Doctor, MD Pager: [phone]    I spent a total of  40 Minutes   in face to face in clinical consultation, greater than 50% of which was counseling/coordinating care for mesenteric ischemia.

## 2023-09-18 ENCOUNTER — Other Ambulatory Visit: Payer: Self-pay | Admitting: Student

## 2023-09-18 ENCOUNTER — Ambulatory Visit
Admission: RE | Admit: 2023-09-18 | Discharge: 2023-09-18 | Disposition: A | Discharge status: Home / Self Care | Source: Ambulatory Visit | Attending: Gastroenterology

## 2023-09-18 DIAGNOSIS — E785 Hyperlipidemia, unspecified: Secondary | ICD-10-CM

## 2023-09-18 DIAGNOSIS — Z79899 Other long term (current) drug therapy: Secondary | ICD-10-CM

## 2023-09-18 DIAGNOSIS — R55 Syncope and collapse: Secondary | ICD-10-CM

## 2023-09-18 DIAGNOSIS — K559 Vascular disorder of intestine, unspecified: Secondary | ICD-10-CM

## 2023-09-18 DIAGNOSIS — I25118 Atherosclerotic heart disease of native coronary artery with other forms of angina pectoris: Secondary | ICD-10-CM

## 2023-09-18 DIAGNOSIS — Z01812 Encounter for preprocedural laboratory examination: Secondary | ICD-10-CM

## 2023-09-18 DIAGNOSIS — I7121 Aneurysm of the ascending aorta, without rupture: Secondary | ICD-10-CM

## 2023-09-18 DIAGNOSIS — I5020 Unspecified systolic (congestive) heart failure: Secondary | ICD-10-CM

## 2023-09-18 HISTORY — PX: IR RADIOLOGIST EVAL & MGMT: IMG5224

## 2023-09-19 ENCOUNTER — Ambulatory Visit

## 2023-09-19 ENCOUNTER — Ambulatory Visit (INDEPENDENT_AMBULATORY_CARE_PROVIDER_SITE_OTHER)

## 2023-09-19 ENCOUNTER — Encounter: Payer: Self-pay | Admitting: Family Medicine

## 2023-09-19 ENCOUNTER — Ambulatory Visit (INDEPENDENT_AMBULATORY_CARE_PROVIDER_SITE_OTHER): Admitting: Family Medicine

## 2023-09-19 ENCOUNTER — Ambulatory Visit: Payer: Self-pay

## 2023-09-19 VITALS — BP 139/74 | HR 75 | Temp 97.7°F | Ht 61.0 in | Wt 158.0 lb

## 2023-09-19 DIAGNOSIS — J189 Pneumonia, unspecified organism: Secondary | ICD-10-CM

## 2023-09-19 DIAGNOSIS — K551 Chronic vascular disorders of intestine: Secondary | ICD-10-CM | POA: Diagnosis not present

## 2023-09-19 MED ORDER — CEFTRIAXONE SODIUM 1 G IJ SOLR
1.0000 g | Freq: Once | INTRAMUSCULAR | Status: AC
  Administered 2023-09-19: 1 g via INTRAMUSCULAR

## 2023-09-19 MED ORDER — HYDROCODONE BIT-HOMATROP MBR 5-1.5 MG/5ML PO SOLN: 5.0000 mL | Freq: Four times a day (QID) | ORAL | 0 refills | Status: AC | PRN

## 2023-09-19 MED ORDER — MOXIFLOXACIN HCL 400 MG PO TABS: 400.0000 mg | ORAL_TABLET | Freq: Every day | ORAL | 0 refills | Status: DC

## 2023-09-19 MED ORDER — BETAMETHASONE SOD PHOS & ACET 6 (3-3) MG/ML IJ SUSP
6.0000 mg | Freq: Once | INTRAMUSCULAR | Status: AC
  Administered 2023-09-19: 6 mg via INTRAMUSCULAR

## 2023-09-19 NOTE — Progress Notes (Signed)
 Subjective:  Patient ID: [name], female    DOB: 1936/02/18  Age: 88 y.o. MRN: [id number]  CC: Hospitalization Follow-up (Dx with pneumonia and abx did not help. Still having a lot of congestion, cough and fatigued. Pt also reports body aches and SOB. )   HPI [name] presents for recheck of pneumonia for which she was recently hospitalized from 09/05/23 to 09/09/23 at [facility].  She is still having productive cough.  She denies any fever.  She is somewhat short of breath with activity but okay at rest.  Patient does not have energy.  She also has been told she needs a procedure.  She is not sure what the procedure is.  However review of the chart reveals that interventional radiology wants to place a stent in the proximal superior mesenteric artery.  That report was reviewed with the patient as part of this visit.  Mesenteric atherosclerosis     09/11/2023    2:28 PM 08/08/2023   10:27 AM 07/11/2023    3:37 PM  Depression screen PHQ 2/9  Decreased Interest 0 1 1  Down, Depressed, Hopeless 1 1 1   PHQ - 2 Score 1 2 2   Altered sleeping  1 1  Tired, decreased energy  2 3  Change in appetite  1 1  Feeling bad or failure about yourself   0 1  Trouble concentrating  3 3  Moving slowly or fidgety/restless  2 0  Suicidal thoughts  0 0  PHQ-9 Score  11 11  Difficult doing work/chores  Somewhat difficult Not difficult at all    History [name] has a past medical history of Acute on chronic respiratory failure with hypoxia (HCC) (04/24/2018), Anxiety, Atypical chest pain, Bronchospasm, CAD (coronary artery disease), Cervical disc disorder with myelopathy, unspecified cervical region, Cervicalgia (01/19/2009), Chest pain at rest (01/27/2015), Chronic systolic (congestive) heart failure (HCC), COPD (chronic obstructive pulmonary disease) (HCC), Coronary artery disease, De Quervain's disease (tenosynovitis) (04/02/2011), Disc disease with myelopathy, cervical, Essential hypertension,  Hemorrhoids, Liver mass, Lung, cysts, congenital, Myocardial infarction (HCC), Nephrolithiasis, Nonischemic cardiomyopathy (HCC), On home O2, Osteoarthritis, Sprain of wrist (08/07/2012), and Thoracic ascending aortic aneurysm (HCC).   She has a past surgical history that includes Tonsillectomy and adenoidectomy; Complete hysterectomy; Benign breast tumors; Cholecystectomy; Colonoscopy; Colonoscopy (N/A, 09/22/2014); LEFT HEART CATH AND CORONARY ANGIOGRAPHY (N/A, 11/23/2016); CORONARY STENT INTERVENTION (N/A, 11/23/2016); Colonoscopy with propofol  (N/A, 04/16/2023); Polypectomy (04/16/2023); and IR Radiologist Eval & Mgmt (09/18/2023).   Her family history includes Aneurysm in her father; Diabetes in her brother; Heart disease in her brother, mother, and sister; Lung cancer in her brother.She reports that she quit smoking about 15 years ago. Her smoking use included cigarettes. She started smoking about 46 years ago. She has a 7.8 pack-year smoking history. She has never used smokeless tobacco. She reports that she does not drink alcohol and does not use drugs.    ROS Review of Systems  Constitutional:  Positive for fatigue. Negative for activity change, appetite change and fever.  HENT:  Negative for congestion.   Eyes:  Negative for visual disturbance.  Respiratory:  Positive for cough and shortness of breath.   Cardiovascular:  Negative for chest pain.  Gastrointestinal:  Positive for abdominal pain. Negative for constipation, diarrhea, nausea and vomiting.  Genitourinary:  Negative for difficulty urinating.  Musculoskeletal:  Negative for arthralgias and myalgias.  Neurological:  Negative for headaches.  Psychiatric/Behavioral:  Negative for sleep disturbance.     Objective:  BP  139/74   Pulse 75   Temp 97.7 F (36.5 C)   Ht 5\' 1"  (1.549 m)   Wt 158 lb (71.7 kg)   SpO2 99%   BMI 29.85 kg/m   BP Readings from Last 3 Encounters:  09/19/23 139/74  09/18/23 121/67  09/09/23 (!) 142/56     Wt Readings from Last 3 Encounters:  09/19/23 158 lb (71.7 kg)  09/05/23 159 lb 2.8 oz (72.2 kg)  08/08/23 157 lb (71.2 kg)     Physical Exam Constitutional:      General: She is not in acute distress.    Appearance: She is well-developed.  HENT:     Head: Normocephalic and atraumatic.  Eyes:     Conjunctiva/sclera: Conjunctivae normal.     Pupils: Pupils are equal, round, and reactive to light.  Neck:     Thyroid : No thyromegaly.  Cardiovascular:     Rate and Rhythm: Normal rate and regular rhythm.     Heart sounds: Normal heart sounds. No murmur heard. Pulmonary:     Effort: Pulmonary effort is normal. No respiratory distress.     Breath sounds: Normal breath sounds. No wheezing or rales.  Abdominal:     General: Bowel sounds are normal. There is no distension.     Palpations: Abdomen is soft.     Tenderness: There is no abdominal tenderness.  Musculoskeletal:        General: Normal range of motion.     Cervical back: Normal range of motion and neck supple.  Lymphadenopathy:     Cervical: No cervical adenopathy.  Skin:    General: Skin is warm and dry.  Neurological:     Mental Status: She is alert and oriented to person, place, and time.  Psychiatric:        Behavior: Behavior normal.    CXR: resolving infiltrate   Assessment & Plan:  Community acquired pneumonia of right lower lobe of lung -     DG Chest 2 View; Future -     Betamethasone  Sod Phos & Acet -     cefTRIAXone  Sodium -     Moxifloxacin  HCl; Take 1 tablet (400 mg total) by mouth daily.  Dispense: 10 tablet; Refill: 0 -     HYDROcodone  Bit-Homatrop MBr; Take 5 mLs by mouth every 6 (six) hours as needed for up to 5 days for cough.  Dispense: 100 mL; Refill: 0  Superior mesenteric artery atherosclerosis (HCC)  Procedure please stent the superior mesenteric artery discussed with the patient.  To be arranged with/by interventional radiology.  Dr. Creasie Doctor  Follow-up: Return in about 2 weeks  (around 10/03/2023).  Roise Cleaver, M.D.

## 2023-09-19 NOTE — Telephone Encounter (Signed)
 Pt already had appointment for today. Calling pt to cancel with DOD.

## 2023-09-19 NOTE — Telephone Encounter (Signed)
 Copied from CRM [id number]. Topic: Clinical - Red Word Triage >> Sep 19, 2023  9:35 AM Shelby Dessert H wrote: Red Word that prompted transfer to Nurse Triage: Patient is having a hard time breathing, couching, she is taking her breathing treatments but nothing is working, she is full of mucus. Patient feels like she has the flu, she is having chills, she said on a scale of 1-10 she is feelings like a 10. Patient does COPD.   Chief Complaint: Shortness of breath  Symptoms: Difficulty breathing, cough, chills Frequency: Constant  Disposition: [] ED /[] Urgent Care (no appt availability in office) / [x] Appointment(In office/virtual)/ []  [place] [facility]/ [] Home Care/ [] Refused Recommended Disposition /[] [place] [facility]/ []  Follow-up with PCP Additional Notes: Patient reports she was recently in the hospital for pneumonia and had an appointment today with Dr. Veleta Gerold that was cancelled due to him not being in the office. Patient states she is still experiencing difficulty breathing and cough and has been using her breathing treatments with minor relief. Patient would like to make an appointment for today to be re-evaluated and treated for her symptoms. Appointment made for today with the DOD.     Reason for Disposition  [1] Longstanding difficulty breathing (e.g., CHF, COPD, emphysema) AND [2] WORSE than normal  Answer Assessment - Initial Assessment Questions 1. RESPIRATORY STATUS: "Describe your breathing?" (e.g., wheezing, shortness of breath, unable to speak, severe coughing)      Shortness of breath  2. ONSET: "When did this breathing problem begin?"      Weeks 3. PATTERN "Does the difficult breathing come and go, or has it been constant since it started?"      Constant  4. SEVERITY: "How bad is your breathing?" (e.g., mild, moderate, severe)    - MILD: No SOB at rest, mild SOB with walking, speaks normally in sentences, can lie down, no retractions, pulse < 100.    - MODERATE: SOB at  rest, SOB with minimal exertion and prefers to sit, cannot lie down flat, speaks in phrases, mild retractions, audible wheezing, pulse 100-120.    - SEVERE: Very SOB at rest, speaks in single words, struggling to breathe, sitting hunched forward, retractions, pulse > 120      Moderate to severe  5. RECURRENT SYMPTOM: "Have you had difficulty breathing before?" If Yes, ask: "When was the last time?" and "What happened that time?"      Yes 6. CARDIAC HISTORY: "Do you have any history of heart disease?" (e.g., heart attack, angina, bypass surgery, angioplasty)      Yes 7. LUNG HISTORY: "Do you have any history of lung disease?"  (e.g., pulmonary embolus, asthma, emphysema)     COPD 8. CAUSE: "What do you think is causing the breathing problem?"      Recently admitted for pneumonia  9. OTHER SYMPTOMS: "Do you have any other symptoms? (e.g., dizziness, runny nose, cough, chest pain, fever)     Cough, chills 10. O2 SATURATION MONITOR:  "Do you use an oxygen  saturation monitor (pulse oximeter) at home?" If Yes, ask: "What is your reading (oxygen  level) today?" "What is your usual oxygen  saturation reading?" (e.g., 95%)       "My oxygen  is okay"  Protocols used: Breathing Difficulty-A-AH

## 2023-09-20 ENCOUNTER — Other Ambulatory Visit: Payer: Self-pay | Admitting: *Deleted

## 2023-09-20 NOTE — Patient Instructions (Signed)
 Visit Information  Thank you for taking time to visit with me today. Please don't hesitate to contact me if I can be of assistance to you before our next scheduled telephone appointment.  Our next appointment is by telephone on 09/26/23  at 215 pm  Following is a copy of your care plan:   Goals Addressed             This Visit's Progress    VBCI Transitions of Care (TOC) Care Plan       Problems:  Recent Hospitalization for treatment of COPD and Pneumonia Home health contacted pt and she states told them not to come out to her home, she's busy and has a lot going on Patient is high risk for falls, uses walker Patient reports she saw primary care provider 09/19/23 and prescribed antibiotics "for lingering pneumonia", pt states she has productive cough "about the same", dyspnea with exertion  Goal:  Over the next 30 days, the patient will not experience hospital readmission  Interventions:   COPD Interventions: Provided patient with basic written and verbal COPD education on self care/management/and exacerbation prevention Advised patient to track and manage COPD triggers Advised patient to self assesses COPD action plan zone and make appointment with provider if in the yellow zone for 48 hours without improvement Discussed the importance of adequate rest and management of fatigue with COPD Screening for signs and symptoms of depression related to chronic disease state  Assessed social determinant of health barriers Reinforced safety precautions Reinforced oxygen  safety Reinforced signs/ symptoms infection, pneumonia Reviewed upcoming scheduled appointments including primary care provider 11/07/23  Patient Self Care Activities:  Attend all scheduled provider appointments Attend church or other social activities Call pharmacy for medication refills 3-7 days in advance of running out of medications Call provider office for new concerns or questions  Notify RN Care Manager of TOC  call rescheduling needs Participate in Transition of Care Program/Attend TOC scheduled calls Take medications as prescribed   identify and remove indoor air pollutants listen for public air quality announcements every day develop a rescue plan follow rescue plan if symptoms flare-up eat healthy/prescribed diet: heart healthy, low sodium get at least 7 to 8 hours of sleep at night Use oxygen  as prescribed, be careful of tubing so you do not fall Primary care provider scheduled 11/07/23 @ 110 pm  Plan:  Telephone follow up appointment with care management team member scheduled for:  09/26/23 @ 215 pm The patient has been provided with contact information for the care management team and has been advised to call with any health related questions or concerns.         Patient verbalizes understanding of instructions and care plan provided today and agrees to view in MyChart. Active MyChart status and patient understanding of how to access instructions and care plan via MyChart confirmed with patient.     Telephone follow up appointment with care management team member scheduled for: 09/26/23 @ 215 pm  Please call the care guide team at [phone] if you need to cancel or reschedule your appointment.   Please call the Suicide and Crisis Lifeline: 988 call the [place]  National Suicide Prevention Lifeline: 7347681478 or TTY: 973-153-1777 TTY 314 777 7210) to talk to a trained counselor call 1-800-273-TALK (toll free, 24 hour hotline) go to [facility] [address], [place]  [phone]) call the [facility] Crisis Line: [phone] call 911 if you are experiencing a Mental Health or Behavioral Health Crisis or need  someone to talk to.  Cecilie Coffee [facility], BSN RN Care Manager/ Transition of Care [place]/ [facility] [phone]

## 2023-09-20 NOTE — Transitions of Care (Post Inpatient/ED Visit) (Signed)
 Transition of Care week 2  Visit Note  09/20/2023  Name: [name] MRN: [id number]          DOB: 1936-03-09  Situation: Patient enrolled in [facility] 30-day program. Visit completed with patient by telephone.   Background:  Patient reports she saw primary care provider 09/19/23 and prescribed antibiotics "for lingering pneumonia", pt states she has productive cough "about the same", dyspnea with exertion   Past Medical History:  Diagnosis Date   Acute on chronic respiratory failure with hypoxia (HCC) 04/24/2018   Anxiety    Atypical chest pain    Bronchospasm    CAD (coronary artery disease)    a. s/p DES to mid-LAD and DES to mid-OM1 in 11/2016   Cervical disc disorder with myelopathy, unspecified cervical region    Cervicalgia 01/19/2009   Qualifier: Diagnosis of  By: Phyllis Breeze MD, [place]     Chest pain at rest 01/27/2015   Chronic systolic (congestive) heart failure (HCC)    COPD (chronic obstructive pulmonary disease) (HCC)    Coronary artery disease    De Quervain's disease (tenosynovitis) 04/02/2011   Disc disease with myelopathy, cervical    Essential hypertension    Hemorrhoids    Liver mass    Lung, cysts, congenital    Left lung cyst   Myocardial infarction (HCC)    Nephrolithiasis    Embedded   Nonischemic cardiomyopathy (HCC)    LVEF 35-40% 2015   On home O2    Osteoarthritis    Sprain of wrist 08/07/2012   Thoracic ascending aortic aneurysm (HCC)    4.3 cm April 2016    Assessment: Patient Reported Symptoms: Cognitive Cognitive Status: Able to follow simple commands, Alert and oriented to person, place, and time   Health Maintenance Behaviors: Sleep adequate Healing Pattern: Unsure Health Facilitated by: Rest  Neurological Neurological Review of Symptoms: No symptoms reported    HEENT HEENT Symptoms Reported: No symptoms reported      Cardiovascular Cardiovascular Symptoms Reported: No symptoms reported Does patient have uncontrolled Hypertension?:  No Cardiovascular Conditions: Heart failure Cardiovascular Management Strategies: Adequate rest, Routine screening Cardiovascular Self-Management Outcome: 3 (uncertain)  Respiratory Respiratory Symptoms Reported: Productive cough Other Respiratory Symptoms: dyspnea with exertion Additional Respiratory Details: oxygen  2 liters via [place] at hs and prn, pt checks oxygen  saturation throughout the day Respiratory Conditions: COPD (pt continues on antibiotic for pneumonia) Respiratory Self-Management Outcome: 3 (uncertain) Respiratory Comment: pt saw primary care provider 5/8, continues on antibiotics  Endocrine Patient reports the following symptoms related to hypoglycemia or hyperglycemia : No symptoms reported Is patient diabetic?: No    Gastrointestinal Gastrointestinal Symptoms Reported: No symptoms reported   Nutrition Risk Screen (CP): No indicators present  Genitourinary Genitourinary Symptoms Reported: No symptoms reported    Integumentary Integumentary Symptoms Reported: No symptoms reported    Musculoskeletal Musculoskelatal Symptoms Reviewed: Weakness Additional Musculoskeletal Details: uses walker Musculoskeletal Conditions: Other Other Musculoskeletal Conditions: weakness Musculoskeletal Management Strategies: Routine screening, Adequate rest Musculoskeletal Self-Management Outcome: 3 (uncertain)      Psychosocial Psychosocial Symptoms Reported: No symptoms reported         There were no vitals filed for this visit.  Medications Reviewed Today     Reviewed by Daralyn Earl, RN (Registered Nurse) on 09/20/23 at 1041  Med List Status: <None>   Medication Order Taking? Sig Documenting Provider Last Dose Status Informant  albuterol  (VENTOLIN  HFA) 108 (90 Base) MCG/ACT inhaler [id number] No Inhale 2 puffs into the lungs every 4 (four) hours  as needed for wheezing or shortness of breath. Rayfield Cairo, MD Taking Active   alendronate  (FOSAMAX ) 70 MG tablet [id number] No  Take 1 tablet (70 mg total) by mouth every 7 (seven) days. Take with a full glass of water  on an empty stomach. Do not lay down for at least 2 hours Roise Cleaver, MD Taking Active Self  aspirin  EC 81 MG tablet [id number] No Take 81 mg by mouth every morning.  [provider] Taking Active Self           Med Note Author Board, Wheeler Hammonds Feb 28, 2023  2:11 PM)    bisoprolol  (ZEBETA ) 5 MG tablet [id number] No Take 0.5 tablets (2.5 mg total) by mouth daily. Rayfield Cairo, MD Taking Active   cetirizine  (ZYRTEC  ALLERGY) 10 MG tablet [id number] No Take 1 tablet (10 mg total) by mouth daily. For itching Roise Cleaver, MD Taking Active Self  clorazepate  (TRANXENE ) 7.5 MG tablet [id number] No Take 1 tablet (7.5 mg total) by mouth daily as needed for anxiety. Roise Cleaver, MD Taking Active Self  dicyclomine  (BENTYL ) 10 MG capsule [id number] No Take 1 capsule (10 mg total) by mouth every 12 (twelve) hours as needed (abdominal pain). Umberto Ganong, Bearl Limes, MD Taking Active Self  fenofibrate  (TRICOR ) 48 MG tablet [id number] No Take 1 tablet (48 mg total) by mouth daily. Roise Cleaver, MD Taking Active Self  fluocinonide  cream (LIDEX ) 0.05 % [id number] No Apply 1 Application topically 2 (two) times daily. Roise Cleaver, MD Taking Active Self  Fluticasone -Umeclidin-Vilant (TRELEGY ELLIPTA ) 100-62.5-25 MCG/ACT AEPB [id number] No Inhale 1 puff into the lungs daily in the afternoon. Wilder Handy, MD Taking Active Self  HYDROcodone  bit-homatropine [facility]) 5-1.5 MG/5ML syrup [id number]  Take 5 mLs by mouth every 6 (six) hours as needed for up to 5 days for cough. Roise Cleaver, MD  Active   ipratropium-albuterol  (DUONEB) 0.5-2.5 (3) MG/3ML SOLN [id number] No Take 3 mLs by nebulization 3 (three) times daily. Rayfield Cairo, MD Taking Active   moxifloxacin  (AVELOX ) 400 MG tablet [id number]  Take 1 tablet (400 mg total) by mouth daily. Roise Cleaver, MD  Active   OXYGEN  [id number] No Inhale 2 L into the  lungs at bedtime. Can use in the morning as needed [provider] Taking Active Self           Med Note [facility], CARLOS A   Fri Aug 25, 2021 10:48 AM)    pantoprazole  (PROTONIX ) 40 MG tablet [id number] No Take 1 tablet (40 mg total) by mouth daily. Demaris Fillers, MD Taking Active Self  polyethylene glycol (MIRALAX  / GLYCOLAX ) 17 g packet [id number] No Take 17 g by mouth daily as needed for mild constipation. Rayfield Cairo, MD Taking Active   predniSONE  (DELTASONE ) 20 MG tablet [id number] No Take 3 PO QAM x3days, 2 PO QAM x3days, 1 PO QAM x3days  Patient not taking: Reported on 09/19/2023   Rayfield Cairo, MD Not Taking Active   sacubitril -valsartan  (ENTRESTO ) 24-26 MG [id number] No TAKE 1 TABLET BY MOUTH TWICE DAILY. Nishan, Peter C, MD Taking Active Self  triamterene -hydrochlorothiazide (MAXZIDE-25) 37.5-25 MG tablet [id number] No Take 1 tablet by mouth daily as needed (blood pressure and fluid). Rayfield Cairo, MD Taking Active             Recommendation:   PCP Follow-up 11/07/23 @ 110 pm  Follow Up Plan:   Telephone follow-up 09/26/23 @ 215 pm  Cecilie Coffee [facility], BSN RN Care Manager/ Transition  of Care / [facility] [phone]

## 2023-09-22 ENCOUNTER — Encounter: Payer: Self-pay | Admitting: Family Medicine

## 2023-09-23 ENCOUNTER — Encounter: Payer: Self-pay | Admitting: Family Medicine

## 2023-09-26 ENCOUNTER — Other Ambulatory Visit (HOSPITAL_COMMUNITY): Payer: Self-pay | Admitting: Interventional Radiology

## 2023-09-26 ENCOUNTER — Other Ambulatory Visit: Payer: Self-pay | Admitting: *Deleted

## 2023-09-26 ENCOUNTER — Telehealth (HOSPITAL_COMMUNITY): Payer: Self-pay | Admitting: Interventional Radiology

## 2023-09-26 DIAGNOSIS — K551 Chronic vascular disorders of intestine: Secondary | ICD-10-CM

## 2023-09-26 NOTE — Patient Instructions (Signed)
 Visit Information  Thank you for taking time to visit with me today. Please don't hesitate to contact me if I can be of assistance to you before our next scheduled telephone appointment.  Our next appointment is by telephone on 10/03/23 @ 115 pm  Following is a copy of your care plan:   Goals Addressed             This Visit's Progress    VBCI Transitions of Care (TOC) Care Plan       Problems:  Recent Hospitalization for treatment of COPD and Pneumonia Home health contacted pt and she states told them not to come out to her home, she's busy and has a lot going on Patient is high risk for falls, uses walker Patient reports she saw primary care provider 09/19/23 and prescribed antibiotics "for lingering pneumonia", pt states she has productive cough "about the same", dyspnea with exertion 09/26/23- pt states she is feeling much better, denies dyspnea, has "cough only on occasion"  Goal:  Over the next 30 days, the patient will not experience hospital readmission  Interventions:   COPD Interventions: Provided patient with basic written and verbal COPD education on self care/management/and exacerbation prevention Advised patient to track and manage COPD triggers Advised patient to self assesses COPD action plan zone and make appointment with provider if in the yellow zone for 48 hours without improvement Discussed the importance of adequate rest and management of fatigue with COPD Screening for signs and symptoms of depression related to chronic disease state  Assessed social determinant of health barriers Reviewed safety precautions Reinforced oxygen  safety Reviewed signs/ symptoms infection, pneumonia Reviewed upcoming scheduled appointments including primary care provider 11/07/23  Patient Self Care Activities:  Attend all scheduled provider appointments Attend church or other social activities Call pharmacy for medication refills 3-7 days in advance of running out of  medications Call provider office for new concerns or questions  Notify RN Care Manager of TOC call rescheduling needs Participate in Transition of Care Program/Attend TOC scheduled calls Take medications as prescribed   identify and remove indoor air pollutants listen for public air quality announcements every day develop a rescue plan follow rescue plan if symptoms flare-up eat healthy/prescribed diet: heart healthy, low sodium get at least 7 to 8 hours of sleep at night Use oxygen  as prescribed, be careful of tubing so you do not fall Primary care provider scheduled 11/07/23 @ 110 pm Follow COPD action plan  Plan:  Telephone follow up appointment with care management team member scheduled for:  10/03/23 @ 115 pm The patient has been provided with contact information for the care management team and has been advised to call with any health related questions or concerns.         Patient verbalizes understanding of instructions and care plan provided today and agrees to view in MyChart. Active MyChart status and patient understanding of how to access instructions and care plan via MyChart confirmed with patient.     Telephone follow up appointment with care management team member scheduled for: 10/03/23 @ 115 pm  Please call the care guide team at [phone] if you need to cancel or reschedule your appointment.   Please call the Suicide and Crisis Lifeline: 988 call the [place]  National Suicide Prevention Lifeline: 2025569124 or TTY: [phone] TTY 385 013 2416) to talk to a trained counselor call 1-800-273-TALK (toll free, 24 hour hotline) go to [facility] Urgent Care [address], [place] [phone]) call the [facility]  Crisis Line: [phone] call 911 if you are experiencing a Mental Health or Behavioral Health Crisis or need someone to talk to.  Cecilie Coffee [facility], BSN RN Care Manager/ Transition of Care [place]/ [facility] [phone]

## 2023-09-26 NOTE — Telephone Encounter (Signed)
 Called pt to schedule mesenteric angio with Dr. Jinx Mourning. Pt will have her daughter Debria Fang) call me back to set this up. JM

## 2023-09-26 NOTE — Patient Outreach (Signed)
 Transition of Care week 3  Visit Note  09/26/2023  Name: [name] MRN: [id number]          DOB: 07-19-1935  Situation: Patient enrolled in [facility] 30-day program. Visit completed with patient by telephone.   Background: Patient reports "I'm feeling much better", no new concerns reported    Past Medical History:  Diagnosis Date   Acute on chronic respiratory failure with hypoxia (HCC) 04/24/2018   Anxiety    Atypical chest pain    Bronchospasm    CAD (coronary artery disease)    a. s/p DES to mid-LAD and DES to mid-OM1 in 11/2016   Cervical disc disorder with myelopathy, unspecified cervical region    Cervicalgia 01/19/2009   Qualifier: Diagnosis of  By: Phyllis Breeze MD, [place]     Chest pain at rest 01/27/2015   Chronic systolic (congestive) heart failure (HCC)    COPD (chronic obstructive pulmonary disease) (HCC)    Coronary artery disease    De Quervain's disease (tenosynovitis) 04/02/2011   Disc disease with myelopathy, cervical    Essential hypertension    Hemorrhoids    Liver mass    Lung, cysts, congenital    Left lung cyst   Myocardial infarction (HCC)    Nephrolithiasis    Embedded   Nonischemic cardiomyopathy (HCC)    LVEF 35-40% 2015   On home O2    Osteoarthritis    Sprain of wrist 08/07/2012   Thoracic ascending aortic aneurysm (HCC)    4.3 cm April 2016    Assessment: Patient Reported Symptoms: Cognitive Cognitive Status: Able to follow simple commands, Alert and oriented to person, place, and time   Healing Pattern: Unsure  Neurological Neurological Review of Symptoms: No symptoms reported    HEENT HEENT Symptoms Reported: No symptoms reported      Cardiovascular Cardiovascular Symptoms Reported: No symptoms reported    Respiratory Respiratory Symptoms Reported: No symptoms reported Other Respiratory Symptoms: pt denies dyspnea,   has cough only on occasion Additional Respiratory Details: oxygen  2 liters via [place] at hs and prn, has not checked 02  saturation today Respiratory Conditions: COPD Respiratory Self-Management Outcome: 3 (uncertain) Respiratory Comment: pt states she is finished with antibiotics  Endocrine Patient reports the following symptoms related to hypoglycemia or hyperglycemia : No symptoms reported Is patient diabetic?: No    Gastrointestinal Gastrointestinal Symptoms Reported: No symptoms reported      Genitourinary Genitourinary Symptoms Reported: No symptoms reported    Integumentary Integumentary Symptoms Reported: No symptoms reported    Musculoskeletal Musculoskelatal Symptoms Reviewed: No symptoms reported        Psychosocial Psychosocial Symptoms Reported: No symptoms reported         There were no vitals filed for this visit.  Medications Reviewed Today     Reviewed by Daralyn Earl, RN (Registered Nurse) on 09/26/23 at 1418  Med List Status: <None>   Medication Order Taking? Sig Documenting Provider Last Dose Status Informant  albuterol  (VENTOLIN  HFA) 108 (90 Base) MCG/ACT inhaler [id number] No Inhale 2 puffs into the lungs every 4 (four) hours as needed for wheezing or shortness of breath. Rayfield Cairo, MD Taking Active   alendronate  (FOSAMAX ) 70 MG tablet [id number] No Take 1 tablet (70 mg total) by mouth every 7 (seven) days. Take with a full glass of water  on an empty stomach. Do not lay down for at least 2 hours Roise Cleaver, MD Taking Active Self  aspirin  EC 81 MG tablet [id number] No Take 81  mg by mouth every morning.  [provider] Taking Active Self           Med Note Author Board, Wheeler Hammonds Feb 28, 2023  2:11 PM)    bisoprolol  (ZEBETA ) 5 MG tablet [id number] No Take 0.5 tablets (2.5 mg total) by mouth daily. Johnson, Clanford L, MD Taking Active   cetirizine  (ZYRTEC  ALLERGY) 10 MG tablet [id number] No Take 1 tablet (10 mg total) by mouth daily. For itching Roise Cleaver, MD Taking Active Self  clorazepate  (TRANXENE ) 7.5 MG tablet [id number] No Take 1 tablet (7.5 mg  total) by mouth daily as needed for anxiety. Roise Cleaver, MD Taking Active Self  dicyclomine  (BENTYL ) 10 MG capsule [id number] No Take 1 capsule (10 mg total) by mouth every 12 (twelve) hours as needed (abdominal pain). Umberto Ganong, Bearl Limes, MD Taking Active Self  fenofibrate  (TRICOR ) 48 MG tablet [id number] No Take 1 tablet (48 mg total) by mouth daily. Roise Cleaver, MD Taking Active Self  fluocinonide  cream (LIDEX ) 0.05 % [id number] No Apply 1 Application topically 2 (two) times daily. Roise Cleaver, MD Taking Active Self  Fluticasone -Umeclidin-Vilant (TRELEGY ELLIPTA ) 100-62.5-25 MCG/ACT AEPB [id number] No Inhale 1 puff into the lungs daily in the afternoon. Sood, Vineet, MD Taking Active Self  ipratropium-albuterol  (DUONEB) 0.5-2.5 (3) MG/3ML SOLN [id number] No Take 3 mLs by nebulization 3 (three) times daily. Rayfield Cairo, MD Taking Active   moxifloxacin  (AVELOX ) 400 MG tablet [id number]  Take 1 tablet (400 mg total) by mouth daily. Roise Cleaver, MD  Active   OXYGEN  [id number] No Inhale 2 L into the lungs at bedtime. Can use in the morning as needed [provider] Taking Active Self           Med Note [facility], CARLOS A   Fri Aug 25, 2021 10:48 AM)    pantoprazole  (PROTONIX ) 40 MG tablet [id number] No Take 1 tablet (40 mg total) by mouth daily. Demaris Fillers, MD Taking Active Self  polyethylene glycol (MIRALAX  / GLYCOLAX ) 17 g packet [id number] No Take 17 g by mouth daily as needed for mild constipation. Rayfield Cairo, MD Taking Active   predniSONE  (DELTASONE ) 20 MG tablet [id number] No Take 3 PO QAM x3days, 2 PO QAM x3days, 1 PO QAM x3days  Patient not taking: Reported on 09/19/2023   Rayfield Cairo, MD Not Taking Active   sacubitril -valsartan  (ENTRESTO ) 24-26 MG [id number] No TAKE 1 TABLET BY MOUTH TWICE DAILY. Nishan, Peter C, MD Taking Active Self  triamterene -hydrochlorothiazide (MAXZIDE-25) 37.5-25 MG tablet [id number] No Take 1 tablet by mouth daily as  needed (blood pressure and fluid). Rayfield Cairo, MD Taking Active             Recommendation:   PCP Follow-up  Follow Up Plan:   Telephone follow-up 10/03/23 @ 115 pm  Cecilie Coffee [facility], BSN RN Care Manager/ Transition of Care [place]/ [facility] [phone]

## 2023-09-27 ENCOUNTER — Other Ambulatory Visit (HOSPITAL_COMMUNITY): Payer: Self-pay | Admitting: Student

## 2023-09-27 DIAGNOSIS — K551 Chronic vascular disorders of intestine: Secondary | ICD-10-CM

## 2023-09-30 ENCOUNTER — Other Ambulatory Visit (HOSPITAL_COMMUNITY): Payer: Self-pay | Admitting: Student

## 2023-10-01 ENCOUNTER — Other Ambulatory Visit: Payer: Self-pay | Admitting: Radiology

## 2023-10-01 NOTE — H&P (Signed)
 Chief Complaint: Chronic abdominal pain; mesenteric ischemia.   Referring Provider(s): Urban Garden   Supervising Physician: Creasie Doctor  Patient Status: [facility] - Out-pt  History of Present Illness: [name] is a 88 y.o. female with medical issues CAD (s/p stent placement), COPD, heart failure and anxiety.   She has been undergoing work up with GI for abdominal pain.   Pain occurs after she has eaten a solid meal. She is afraid to eat solids and has been on a mostly liquid diet which has led to weight loss ~ 25 pounds in a month.  CTA done 08/22/23 and showed a 75% stenosis in the proximal segment of the celiac axis. Imaging also showed a patent SMA with mild stenosis in the proximal segment estimated at 45%.    She is here today for mesenteric angiography with possible angioplasty.  Patient is Full Code  Past Medical History:  Diagnosis Date   Acute on chronic respiratory failure with hypoxia (HCC) 04/24/2018   Anxiety    Atypical chest pain    Bronchospasm    CAD (coronary artery disease)    a. s/p DES to mid-LAD and DES to mid-OM1 in 11/2016   Cervical disc disorder with myelopathy, unspecified cervical region    Cervicalgia 01/19/2009   Qualifier: Diagnosis of  By: Phyllis Breeze MD, Stanley     Chest pain at rest 01/27/2015   Chronic systolic (congestive) heart failure (HCC)    COPD (chronic obstructive pulmonary disease) (HCC)    Coronary artery disease    De Quervain's disease (tenosynovitis) 04/02/2011   Disc disease with myelopathy, cervical    Essential hypertension    Hemorrhoids    Liver mass    Lung, cysts, congenital    Left lung cyst   Myocardial infarction (HCC)    Nephrolithiasis    Embedded   Nonischemic cardiomyopathy (HCC)    LVEF 35-40% 2015   On home O2    Osteoarthritis    Sprain of wrist 08/07/2012   Thoracic ascending aortic aneurysm (HCC)    4.3 cm April 2016    Past Surgical History:  Procedure Laterality Date    Benign breast tumors     CHOLECYSTECTOMY     COLONOSCOPY     COLONOSCOPY N/A 09/22/2014   Procedure: COLONOSCOPY;  Surgeon: Ruby Corporal, MD;  Location: [facility];  Service: Endoscopy;  Laterality: N/A;  830 -- to be done in OR under fluoro   COLONOSCOPY WITH PROPOFOL  N/A 04/16/2023   Procedure: COLONOSCOPY WITH PROPOFOL ;  Surgeon: Urban Garden, MD;  Location: [facility];  Service: Gastroenterology;  Laterality: N/A;  11:00am, asa 3   Complete hysterectomy     CORONARY STENT INTERVENTION N/A 11/23/2016   Procedure: Coronary Stent Intervention;  Surgeon: Swaziland, Peter M, MD;  Location: [facility] INVASIVE CV LAB;  Service: Cardiovascular;  Laterality: N/A;   IR RADIOLOGIST EVAL & MGMT  09/18/2023   LEFT HEART CATH AND CORONARY ANGIOGRAPHY N/A 11/23/2016   Procedure: Left Heart Cath and Coronary Angiography;  Surgeon: Swaziland, Peter M, MD;  Location: [facility] INVASIVE CV LAB;  Service: Cardiovascular;  Laterality: N/A;   POLYPECTOMY  04/16/2023   Procedure: POLYPECTOMY INTESTINAL;  Surgeon: Urban Garden, MD;  Location: [facility];  Service: Gastroenterology;;   TONSILLECTOMY AND ADENOIDECTOMY      Allergies: Plavix  [clopidogrel  bisulfate], Calcium  channel blockers, Doxycycline , Alprazolam, Codeine, Percodan [oxycodone -aspirin ], Statins, and Valium  Medications: Prior to Admission medications   Medication Sig Start Date  End Date Taking? Authorizing Provider  albuterol  (VENTOLIN  HFA) 108 (90 Base) MCG/ACT inhaler Inhale 2 puffs into the lungs every 4 (four) hours as needed for wheezing or shortness of breath. 09/09/23   Johnson, Clanford L, MD  alendronate  (FOSAMAX ) 70 MG tablet Take 1 tablet (70 mg total) by mouth every 7 (seven) days. Take with a full glass of water  on an empty stomach. Do not lay down for at least 2 hours 06/25/23   Roise Cleaver, MD  aspirin  EC 81 MG tablet Take 81 mg by mouth every morning.     [provider]  bisoprolol  (ZEBETA ) 5 MG tablet Take  0.5 tablets (2.5 mg total) by mouth daily. 09/10/23   Johnson, Clanford L, MD  cetirizine  (ZYRTEC  ALLERGY) 10 MG tablet Take 1 tablet (10 mg total) by mouth daily. For itching 06/25/23   Roise Cleaver, MD  clorazepate  (TRANXENE ) 7.5 MG tablet Take 1 tablet (7.5 mg total) by mouth daily as needed for anxiety. 06/07/23   Roise Cleaver, MD  dicyclomine  (BENTYL ) 10 MG capsule Take 1 capsule (10 mg total) by mouth every 12 (twelve) hours as needed (abdominal pain). 07/18/23   Castaneda Mayorga, Daniel, MD  fenofibrate  (TRICOR ) 48 MG tablet Take 1 tablet (48 mg total) by mouth daily. 08/12/23   Roise Cleaver, MD  fluocinonide  cream (LIDEX ) 0.05 % Apply 1 Application topically 2 (two) times daily. 08/08/23   Roise Cleaver, MD  Fluticasone -Umeclidin-Vilant (TRELEGY ELLIPTA ) 100-62.5-25 MCG/ACT AEPB Inhale 1 puff into the lungs daily in the afternoon. 11/20/22   Sood, Vineet, MD  ipratropium-albuterol  (DUONEB) 0.5-2.5 (3) MG/3ML SOLN Take 3 mLs by nebulization 3 (three) times daily. 09/09/23   Johnson, Clanford L, MD  moxifloxacin  (AVELOX ) 400 MG tablet Take 1 tablet (400 mg total) by mouth daily. 09/19/23   Roise Cleaver, MD  OXYGEN  Inhale 2 L into the lungs at bedtime. Can use in the morning as needed    [provider]  pantoprazole  (PROTONIX ) 40 MG tablet Take 1 tablet (40 mg total) by mouth daily. 03/04/23   Demaris Fillers, MD  polyethylene glycol (MIRALAX  / GLYCOLAX ) 17 g packet Take 17 g by mouth daily as needed for mild constipation. 09/09/23   Johnson, Clanford L, MD  predniSONE  (DELTASONE ) 20 MG tablet Take 3 PO QAM x3days, 2 PO QAM x3days, 1 PO QAM x3days Patient not taking: Reported on 09/19/2023 09/10/23   Rayfield Cairo, MD  sacubitril -valsartan  (ENTRESTO ) 24-26 MG TAKE 1 TABLET BY MOUTH TWICE DAILY. 01/22/23   Nishan, Peter C, MD  triamterene -hydrochlorothiazide (MAXZIDE-25) 37.5-25 MG tablet Take 1 tablet by mouth daily as needed (blood pressure and fluid). 09/09/23   Rayfield Cairo, MD      Family History  Problem Relation Age of Onset   Heart disease Mother    Aneurysm Father    Lung cancer Brother    Heart disease Sister    Diabetes Brother    Heart disease Brother     Social History   Socioeconomic History   Marital status: Widowed    Spouse name: Not on file   Number of children: Not on file   Years of education: 9th   Highest education level: Not on file  Occupational History    Employer: RETIRED  Tobacco Use   Smoking status: Former    Current packs/day: 0.00    Average packs/day: 0.3 packs/day for 31.0 years (7.8 ttl pk-yrs)    Types: Cigarettes    Start date: 05/14/1977    Quit  date: 05/28/2008    Years since quitting: 15.3   Smokeless tobacco: Never   Tobacco comments:    patient states she only smoked for 3 years total  Vaping Use   Vaping status: Never Used  Substance and Sexual Activity   Alcohol use: No    Alcohol/week: 0.0 standard drinks of alcohol   Drug use: No   Sexual activity: Not Currently    Birth control/protection: Surgical    Comment: hyst  Other Topics Concern   Not on file  Social History Narrative   Not on file   Social Drivers of Health   Financial Resource Strain: Low Risk  (04/23/2023)   Overall Financial Resource Strain (CARDIA)    Difficulty of Paying Living Expenses: Not hard at all  Food Insecurity: No Food Insecurity (09/11/2023)   Hunger Vital Sign    Worried About Running Out of Food in the Last Year: Never true    Ran Out of Food in the Last Year: Never true  Transportation Needs: No Transportation Needs (09/11/2023)   PRAPARE - Administrator, Civil Service (Medical): No    Lack of Transportation (Non-Medical): No  Physical Activity: Inactive (04/23/2023)   Exercise Vital Sign    Days of Exercise per Week: 0 days    Minutes of Exercise per Session: 0 min  Stress: No Stress Concern Present (04/23/2023)   [employer] of Occupational Health - Occupational Stress Questionnaire    Feeling  of Stress : Not at all  Social Connections: Socially Isolated (09/05/2023)   Social Connection and Isolation Panel [NHANES]    Frequency of Communication with Friends and Family: More than three times a week    Frequency of Social Gatherings with Friends and Family: More than three times a week    Attends Religious Services: Never    Database administrator or Organizations: No    Attends Banker Meetings: Never    Marital Status: Widowed     Review of Systems: A 12 point ROS discussed and pertinent positives are indicated in the HPI above.  All other systems are negative.  Review of Systems  Vital Signs: BP (!) 150/63   Pulse 72   Temp 98.1 F (36.7 C) (Oral)   Resp 18   Ht 5' (1.524 m)   SpO2 91%   BMI 30.86 kg/m   Advance Care Plan: The advanced care place/surrogate decision maker was discussed at the time of visit and the patient did not wish to discuss or was not able to name a surrogate decision maker or provide an advance care plan.  Physical Exam Vitals reviewed.  Constitutional:      Appearance: Normal appearance.  HENT:     Head: Normocephalic and atraumatic.  Eyes:     Extraocular Movements: Extraocular movements intact.  Cardiovascular:     Rate and Rhythm: Normal rate and regular rhythm.  Pulmonary:     Effort: Pulmonary effort is normal. No respiratory distress.     Breath sounds: Normal breath sounds.  Abdominal:     Palpations: Abdomen is soft.  Musculoskeletal:        General: Normal range of motion.     Cervical back: Normal range of motion.  Skin:    General: Skin is warm and dry.  Neurological:     General: No focal deficit present.     Mental Status: She is alert and oriented to person, place, and time.  Psychiatric:  Mood and Affect: Mood normal.        Behavior: Behavior normal.        Thought Content: Thought content normal.        Judgment: Judgment normal.     Imaging: DG Chest 2 View Result Date:  09/20/2023 CLINICAL DATA:  88 year old female with cough dyspnea and COPD EXAM: CHEST - 2 VIEW COMPARISON:  09/05/2023 FINDINGS: Cardiomediastinal silhouette unchanged. Improved aeration of the lungs compared to the prior plain film with near complete interval resolution of mixed interstitial and airspace opacities at the lung bases. No pneumothorax or pleural effusion. Evidence of coronary artery disease. No new airspace disease. Degenerative changes spine. No displaced fracture. Surgical changes of the cervical region. IMPRESSION: Near complete interval resolution of mixed interstitial and airspace disease Electronically Signed   By: Myrlene Asper D.O.   On: 09/20/2023 13:54   IR Radiologist Eval & Mgmt Result Date: 09/18/2023 EXAM: NEW PATIENT OFFICE VISIT CHIEF COMPLAINT: See Epic note. HISTORY OF PRESENT ILLNESS: See Epic note. REVIEW OF SYSTEMS: See Epic note. PHYSICAL EXAMINATION: See Epic note. ASSESSMENT AND PLAN: See Epic note. Creasie Doctor, MD [facility] Electronically Signed   By: Creasie Doctor M.D.   On: 09/18/2023 13:24   CT Angio Chest/Abd/Pel for Dissection W and/or Wo Contrast Result Date: 09/05/2023 CLINICAL DATA:  Acute aortic syndrome EXAM: CT ANGIOGRAPHY CHEST, ABDOMEN AND PELVIS TECHNIQUE: Non-contrast CT of the chest was initially obtained. Multidetector CT imaging through the chest, abdomen and pelvis was performed using the standard protocol during bolus administration of intravenous contrast. Multiplanar reconstructed images and MIPs were obtained and reviewed to evaluate the vascular anatomy. RADIATION DOSE REDUCTION: This exam was performed according to the departmental dose-optimization program which includes automated exposure control, adjustment of the mA and/or kV according to patient size and/or use of iterative reconstruction technique. CONTRAST:  OMNIPAQUE  IOHEXOL  350 MG/ML SOLN COMPARISON:  August 22, 2023  February 27, 2023 FINDINGS: CTA CHEST FINDINGS Cardiovascular: Satisfactory opacification of the pulmonary arteries to the segmental level. No evidence of pulmonary embolism. Normal heart size. No pericardial effusion. Ascending aorta measures 3.6 x 3.8 cm. No dissections. Aortic arch and descending thoracic aorta are normal. Coronary artery calcifications with coronary stents Mediastinum/Nodes: No enlarged mediastinal, hilar, or axillary lymph nodes. Thyroid  gland, trachea, and esophagus demonstrate no significant findings. Lungs/Pleura: Right lower lobe infiltrates with consolidating patchy nodular changes of the peripheral aspect of the right lower lobe lateral and posterior segments correlate with pneumonia. Musculoskeletal: No chest wall abnormality. No acute or significant osseous findings. Review of the MIP images confirms the above findings. CTA ABDOMEN AND PELVIS FINDINGS VASCULAR Aorta: Normal caliber with mild diffuse atheromatous abdominal aorta without aneurysms or dissections. 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 SMA: No occlusion or significant stenosis Renals: No occlusion or significant stenosis IMA: No occlusion or significant stenosis Inflow: Patent without evidence of aneurysm, dissection, vasculitis or significant stenosis. Veins: No obvious venous abnormality within the limitations of this arterial phase study. Review of the MIP images confirms the above findings. NON-VASCULAR Hepatobiliary: No focal liver abnormality is seen. Status post cholecystectomy. No biliary dilatation. Pancreas: Unremarkable. No pancreatic ductal dilatation or surrounding inflammatory changes. Spleen: Normal in size without focal abnormality. Adrenals/Urinary Tract: Adrenal glands are unremarkable. Kidneys are normal, without renal calculi, focal lesion, or hydronephrosis. Bladder is unremarkable. No change in large cortical cyst lower pole left kidney  5.6 x 5.2 cm. Bosniak 1, No follow-up imaging is recommended. JACR 2018 Feb; 264-273, Management of the Incidental Renal Mass on CT, [employer] 2021; 814-848, Bosniak Classification of Cystic Renal Masses, Version 2019. Stomach/Bowel: Stomach is within normal limits. Appendix appears normal. No evidence of bowel wall thickening, distention, or inflammatory changes. Diffuse diverticulosis sigmoid colon Reproductive: Status post hysterectomy. No adnexal masses. Other: No abdominal wall hernia or abnormality. No abdominopelvic ascites. Musculoskeletal: No fracture is seen. Review of the MIP images confirms the above findings. IMPRESSION: *No evidence of pulmonary embolism. *Right lower lobe pneumonia. *No evidence of aortic aneurysm or dissection. 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 *No acute findings in the abdomen or pelvis. *Diverticulosis sigmoid colon. *Status post cholecystectomy and hysterectomy. *No change in large cortical cyst lower pole left kidney. Bosniak 1, No follow-up imaging is recommended. Electronically Signed   By: Fredrich Jefferson M.D.   On: 09/05/2023 10:08   DG Chest Port 1 View Result Date: 09/05/2023 CLINICAL DATA:  Shortness of breath.  Productive sputum. EXAM: PORTABLE CHEST 1 VIEW COMPARISON:  06/11/2023 FINDINGS: Cardiomegaly with increased interstitial and hazy airspace prominence at the bases. Possible trace pleural fluid on the right. Artifact from EKG leads. Stable aortic tortuosity. Left shoulder replacement. IMPRESSION: Increased opacity at the bases could be from failure or infection. Chronic cardiomegaly. Electronically Signed   By: Ronnette Coke M.D.   On: 09/05/2023 08:47    Labs:  CBC: Recent Labs    09/05/23 0816 09/06/23 0503 09/07/23 0506 10/02/23 0852  WBC 3.4* 5.3 5.6 6.2  HGB 12.0 11.0* 11.3* 13.3  HCT 38.1 34.9* 35.8* 41.9  PLT 209 207 214 231    COAGS: Recent Labs    10/02/23 0852  INR  1.0    BMP: Recent Labs    09/05/23 0816 09/06/23 0503 09/07/23 0506 10/02/23 0852  NA 138 136 138 141  K 3.7 3.5 4.1 4.2  CL 105 104 106 105  CO2 24 23 24 30   GLUCOSE 99 173* 176* 101*  BUN 10 11 17 9   CALCIUM  9.2 8.8* 8.9 10.0  CREATININE 0.63 0.57 0.66 0.65  GFRNONAA >60 >60 >60 >60    LIVER FUNCTION TESTS: Recent Labs    03/12/23 1223 04/22/23 1056 08/08/23 1028 09/05/23 0816  BILITOT 0.3 0.5 0.7 0.6  AST 14 20 22 20   ALT 7 10 11 12   ALKPHOS 49 64 81 55  PROT 6.8 6.4 6.8 6.4*  ALBUMIN 4.4 4.3 4.5 3.6    TUMOR MARKERS: No results for input(s): "AFPTM", "CEA", "CA199", "CHROMGRNA" in the last 8760 hours.  Assessment and Plan:  Abdominal pain secondary to chronic mesenteric ischemia with severe celiac and moderate SMA ostial stenoses secondary to fibrofatty atherosclerotic plaques.     Will proceed with mesenteric angiogram and possible celiac stent today by Dr. Jinx Mourning.  Risks and benefits of mesenteric angiography/angioplasty were discussed with the patient including, but not limited to bleeding, infection, vascular injury or contrast induced renal failure.  This interventional procedure involves the use of X-rays and because of the nature of the planned procedure, it is possible that we will have prolonged use of X-ray fluoroscopy.  Potential radiation risks to you include (but are not limited to) the following: - A slightly elevated risk for cancer  several years later in life. This risk is typically less than 0.5% percent. This risk is low in comparison to the normal incidence of human cancer,  which is 33% for women and 50% for men according to the American Cancer Society. - Radiation induced injury can include skin redness, resembling a rash, tissue breakdown / ulcers and hair loss (which can be temporary or permanent).   The likelihood of either of these occurring depends on the difficulty of the procedure and whether you are sensitive to radiation due  to previous procedures, disease, or genetic conditions.   IF your procedure requires a prolonged use of radiation, you will be notified and given written instructions for further action.  It is your responsibility to monitor the irradiated area for the 2 weeks following the procedure and to notify your physician if you are concerned that you have suffered a radiation induced injury.    All of the patient's questions were answered, patient is agreeable to proceed.  Consent signed and in chart.  Electronically Signed: Connor Deiters, PA-C   10/02/2023, 10:16 AM      I spent a total of    15 Minutes in face to face in clinical consultation, greater than 50% of which was counseling/coordinating care for mesenteric angiography.

## 2023-10-02 ENCOUNTER — Encounter (HOSPITAL_COMMUNITY): Payer: Self-pay

## 2023-10-02 ENCOUNTER — Other Ambulatory Visit (HOSPITAL_COMMUNITY): Payer: Self-pay | Admitting: Interventional Radiology

## 2023-10-02 ENCOUNTER — Ambulatory Visit (HOSPITAL_COMMUNITY)
Admission: RE | Admit: 2023-10-02 | Discharge: 2023-10-02 | Disposition: A | Discharge status: Home / Self Care | Source: Ambulatory Visit | Attending: Interventional Radiology | Admitting: Interventional Radiology

## 2023-10-02 ENCOUNTER — Other Ambulatory Visit: Payer: Self-pay

## 2023-10-02 DIAGNOSIS — K551 Chronic vascular disorders of intestine: Secondary | ICD-10-CM

## 2023-10-02 DIAGNOSIS — Z79899 Other long term (current) drug therapy: Secondary | ICD-10-CM | POA: Diagnosis not present

## 2023-10-02 DIAGNOSIS — Z87891 Personal history of nicotine dependence: Secondary | ICD-10-CM | POA: Insufficient documentation

## 2023-10-02 DIAGNOSIS — J449 Chronic obstructive pulmonary disease, unspecified: Secondary | ICD-10-CM | POA: Insufficient documentation

## 2023-10-02 DIAGNOSIS — I110 Hypertensive heart disease with heart failure: Secondary | ICD-10-CM | POA: Insufficient documentation

## 2023-10-02 DIAGNOSIS — I2510 Atherosclerotic heart disease of native coronary artery without angina pectoris: Secondary | ICD-10-CM | POA: Diagnosis not present

## 2023-10-02 DIAGNOSIS — Z955 Presence of coronary angioplasty implant and graft: Secondary | ICD-10-CM | POA: Diagnosis not present

## 2023-10-02 DIAGNOSIS — G8929 Other chronic pain: Secondary | ICD-10-CM | POA: Diagnosis not present

## 2023-10-02 DIAGNOSIS — F419 Anxiety disorder, unspecified: Secondary | ICD-10-CM | POA: Diagnosis not present

## 2023-10-02 DIAGNOSIS — I5022 Chronic systolic (congestive) heart failure: Secondary | ICD-10-CM | POA: Insufficient documentation

## 2023-10-02 HISTORY — PX: IR ANGIOGRAM SELECTIVE EACH ADDITIONAL VESSEL: IMG667

## 2023-10-02 HISTORY — PX: IR INTRAVASCULAR ULTRASOUND NON CORONARY: IMG6085

## 2023-10-02 HISTORY — PX: IR ANGIOGRAM VISCERAL SELECTIVE: IMG657

## 2023-10-02 HISTORY — PX: IR US GUIDE VASC ACCESS RIGHT: IMG2390

## 2023-10-02 LAB — BASIC METABOLIC PANEL WITH GFR
Anion gap: 6 (ref 5–15)
BUN: 9 mg/dL (ref 8–23)
CO2: 30 mmol/L (ref 22–32)
Calcium: 10 mg/dL (ref 8.9–10.3)
Chloride: 105 mmol/L (ref 98–111)
Creatinine, Ser: 0.65 mg/dL (ref 0.44–1.00)
GFR, Estimated: >60 mL/min (ref >60)
Glucose, Bld: 101 mg/dL — ABNORMAL HIGH (ref 70–99)
Potassium: 4.2 mmol/L (ref 3.5–5.1)
Sodium: 141 mmol/L (ref 135–145)

## 2023-10-02 LAB — CBC
HCT: 41.9 % (ref 36.0–46.0)
Hemoglobin: 13.3 g/dL (ref 12.0–15.0)
MCH: 27.3 pg (ref 26.0–34.0)
MCHC: 31.7 g/dL (ref 30.0–36.0)
MCV: 85.9 fL (ref 80.0–100.0)
Platelets: 231 10*3/uL (ref 150–400)
RBC: 4.88 MIL/uL (ref 3.87–5.11)
RDW: 15.9 % — ABNORMAL HIGH (ref 11.5–15.5)
WBC: 6.2 10*3/uL (ref 4.0–10.5)
nRBC: 0 % (ref 0.0–0.2)

## 2023-10-02 LAB — PROTIME-INR
INR: 1 (ref 0.8–1.2)
Prothrombin Time: 12.9 s (ref 11.4–15.2)

## 2023-10-02 MED ORDER — CEFAZOLIN SODIUM-DEXTROSE 2-4 GM/100ML-% IV SOLN: 2.0000 g | INTRAVENOUS | Status: DC

## 2023-10-02 MED ORDER — IOHEXOL 300 MG/ML  SOLN
100.0000 mL | Freq: Once | INTRAMUSCULAR | Status: AC | PRN
  Administered 2023-10-02: 35 mL via INTRA_ARTERIAL

## 2023-10-02 MED ORDER — MIDAZOLAM HCL 2 MG/2ML IJ SOLN
INTRAMUSCULAR | Status: AC | PRN
  Administered 2023-10-02 (×3): .5 mg via INTRAVENOUS

## 2023-10-02 MED ORDER — IOHEXOL 300 MG/ML  SOLN
100.0000 mL | Freq: Once | INTRAMUSCULAR | Status: AC | PRN
  Administered 2023-10-02: 55 mL via INTRA_ARTERIAL

## 2023-10-02 MED ORDER — MIDAZOLAM HCL 2 MG/2ML IJ SOLN
INTRAMUSCULAR | Status: AC
  Filled 2023-10-02: qty 4

## 2023-10-02 MED ORDER — CEFAZOLIN SODIUM-DEXTROSE 2-4 GM/100ML-% IV SOLN
INTRAVENOUS | Status: AC | PRN
  Administered 2023-10-02: 2 g via INTRAVENOUS

## 2023-10-02 MED ORDER — LIDOCAINE-EPINEPHRINE 1 %-1:100000 IJ SOLN
INTRAMUSCULAR | Status: AC
  Filled 2023-10-02: qty 1

## 2023-10-02 MED ORDER — FENTANYL CITRATE (PF) 100 MCG/2ML IJ SOLN
INTRAMUSCULAR | Status: AC | PRN
  Administered 2023-10-02 (×3): 25 ug via INTRAVENOUS

## 2023-10-02 MED ORDER — CEFAZOLIN SODIUM-DEXTROSE 2-4 GM/100ML-% IV SOLN
INTRAVENOUS | Status: AC
  Filled 2023-10-02: qty 100

## 2023-10-02 MED ORDER — FENTANYL CITRATE (PF) 100 MCG/2ML IJ SOLN
INTRAMUSCULAR | Status: AC
  Filled 2023-10-02: qty 4

## 2023-10-02 NOTE — Discharge Instructions (Signed)
 Femoral Site Care This sheet gives you information about how to care for yourself after your procedure. Your health care provider may also give you more specific instructions. If you have problems or questions, contact your health care provider. What can I expect after the procedure?  After the procedure, it is common to have: Bruising that usually fades within 1-2 weeks. Tenderness at the site. Follow these instructions at home: Wound care Follow instructions from your health care provider about how to take care of your insertion site. Make sure you: Wash your hands with soap and water before you change your bandage (dressing). If soap and water are not available, use hand sanitizer. Remove your dressing as told by your health care provider. 24 hours Do not take baths, swim, or use a hot tub until your health care provider approves. You may shower 24-48 hours after the procedure or as told by your health care provider. Gently wash the site with plain soap and water. Pat the area dry with a clean towel. Do not rub the site. This may cause bleeding. Do not apply powder or lotion to the site. Keep the site clean and dry. Check your femoral site every day for signs of infection. Check for: Redness, swelling, or pain. Fluid or blood. Warmth. Pus or a bad smell. Activity For the first 2-3 days after your procedure, or as long as directed: Avoid climbing stairs as much as possible. Do not squat. Do not lift anything that is heavier than 10 lb (4.5 kg), or the limit that you are told, until your health care provider says that it is safe. For 5 days Rest as directed. Avoid sitting for a long time without moving. Get up to take short walks every 1-2 hours. Do not drive for 24 hours if you were given a medicine to help you relax (sedative). General instructions Take over-the-counter and prescription medicines only as told by your health care provider. Keep all follow-up visits as told by your  health care provider. This is important. Contact a health care provider if you have: A fever or chills. You have redness, swelling, or pain around your insertion site. Get help right away if: The catheter insertion area swells very fast. You Lwin out. You suddenly start to sweat or your skin gets clammy. The catheter insertion area is bleeding, and the bleeding does not stop when you hold steady pressure on the area. The area near or just beyond the catheter insertion site becomes pale, cool, tingly, or numb. These symptoms may represent a serious problem that is an emergency. Do not wait to see if the symptoms will go away. Get medical help right away. Call your local emergency services (911 in the [place] Do not drive yourself to the hospital. Summary After the procedure, it is common to have bruising that usually fades within 1-2 weeks. Check your femoral site every day for signs of infection. Do not lift anything that is heavier than 10 lb (4.5 kg), or the limit that you are told, until your health care provider says that it is safe. This information is not intended to replace advice given to you by your health care provider. Make sure you discuss any questions you have with your health care provider. Document Revised: 05/13/2017 Document Reviewed: 05/13/2017 Elsevier Patient Education  2020 [employer].

## 2023-10-02 NOTE — Procedures (Signed)
 Interventional Radiology Procedure Note  Procedure:  1) Abdominal aortogram 2) Mesenteric angiogram 3) Intravascular ultrasound 4) Arterial manometry  Findings: Please refer to procedural dictation for full description. Imaging without evidence of hemodynamically significant stenosis.  No intervention performed.  7.5 Fr right CFA access closed with 8 Fr Angioseal.  Complications: None immediate  Estimated Blood Loss: < 5 mL  Recommendations: Strict 3 hour bedrest.  1 hour flat, 2 hours head of bed up to 30 degrees. Follow up with Gastroenterology.   Creasie Doctor, MD

## 2023-10-03 ENCOUNTER — Other Ambulatory Visit: Payer: Self-pay | Admitting: *Deleted

## 2023-10-03 NOTE — Transitions of Care (Post Inpatient/ED Visit) (Signed)
 Transition of Care week 4  Visit Note  10/03/2023  Name: [name] MRN: [id number]          DOB: October 19, 1935  Situation: Patient enrolled in [facility] 30-day program. Visit completed with patient by telephone.   Background:   Past Medical History:  Diagnosis Date   Acute on chronic respiratory failure with hypoxia (HCC) 04/24/2018   Anxiety    Atypical chest pain    Bronchospasm    CAD (coronary artery disease)    a. s/p DES to mid-LAD and DES to mid-OM1 in 11/2016   Cervical disc disorder with myelopathy, unspecified cervical region    Cervicalgia 01/19/2009   Qualifier: Diagnosis of  By: Phyllis Breeze MD, [place]     Chest pain at rest 01/27/2015   Chronic systolic (congestive) heart failure (HCC)    COPD (chronic obstructive pulmonary disease) (HCC)    Coronary artery disease    De Quervain's disease (tenosynovitis) 04/02/2011   Disc disease with myelopathy, cervical    Essential hypertension    Hemorrhoids    Liver mass    Lung, cysts, congenital    Left lung cyst   Myocardial infarction (HCC)    Nephrolithiasis    Embedded   Nonischemic cardiomyopathy (HCC)    LVEF 35-40% 2015   On home O2    Osteoarthritis    Sprain of wrist 08/07/2012   Thoracic ascending aortic aneurysm (HCC)    4.3 cm April 2016    Assessment: Patient Reported Symptoms: Cognitive Cognitive Status: Able to follow simple commands, Alert and oriented to person, place, and time, Normal speech and language skills      Neurological Neurological Review of Symptoms: No symptoms reported    HEENT HEENT Symptoms Reported: No symptoms reported      Cardiovascular Cardiovascular Symptoms Reported: No symptoms reported Cardiovascular Conditions: Heart failure Cardiovascular Management Strategies: Adequate rest, Routine screening Cardiovascular Self-Management Outcome: 3 (uncertain)  Respiratory Respiratory Symptoms Reported: No symptoms reported Other Respiratory Symptoms: pt denies dyspnea or  cough Additional Respiratory Details: oxygen  2 liters via [place] at hs and prn Respiratory Conditions: COPD Respiratory Self-Management Outcome: 3 (uncertain)  Endocrine Patient reports the following symptoms related to hypoglycemia or hyperglycemia : No symptoms reported    Gastrointestinal Gastrointestinal Symptoms Reported: No symptoms reported      Genitourinary Genitourinary Symptoms Reported: No symptoms reported    Integumentary Integumentary Symptoms Reported: No symptoms reported    Musculoskeletal Musculoskelatal Symptoms Reviewed: No symptoms reported        Psychosocial Psychosocial Symptoms Reported: No symptoms reported         There were no vitals filed for this visit.  Medications Reviewed Today     Reviewed by Daralyn Earl, RN (Registered Nurse) on 10/03/23 at 1334  Med List Status: <None>   Medication Order Taking? Sig Documenting Provider Last Dose Status Informant  albuterol  (VENTOLIN  HFA) 108 (90 Base) MCG/ACT inhaler [id number] No Inhale 2 puffs into the lungs every 4 (four) hours as needed for wheezing or shortness of breath. Rayfield Cairo, MD 10/01/2023 Active   alendronate  (FOSAMAX ) 70 MG tablet [id number] No Take 1 tablet (70 mg total) by mouth every 7 (seven) days. Take with a full glass of water  on an empty stomach. Do not lay down for at least 2 hours Roise Cleaver, MD Past Week Active Self  aspirin  EC 81 MG tablet [id number] No Take 81 mg by mouth every morning.  [provider] 10/01/2023 Active Self  Med Note Author Board, ANGELICA G   Thu Feb 28, 2023  2:11 PM)    bisoprolol  (ZEBETA ) 5 MG tablet [id number] No Take 0.5 tablets (2.5 mg total) by mouth daily. Rayfield Cairo, MD 10/01/2023 Active   cetirizine  (ZYRTEC  ALLERGY) 10 MG tablet [id number] No Take 1 tablet (10 mg total) by mouth daily. For itching Roise Cleaver, MD 10/01/2023 Active Self  clorazepate  (TRANXENE ) 7.5 MG tablet [id number] No Take 1 tablet (7.5 mg total) by mouth  daily as needed for anxiety. Roise Cleaver, MD 10/01/2023 Active Self  dicyclomine  (BENTYL ) 10 MG capsule [id number] No Take 1 capsule (10 mg total) by mouth every 12 (twelve) hours as needed (abdominal pain). Castaneda Mayorga, Bearl Limes, MD Past Week Active Self  fenofibrate  (TRICOR ) 48 MG tablet [id number] No Take 1 tablet (48 mg total) by mouth daily. Roise Cleaver, MD 10/01/2023 Active Self  fluocinonide  cream (LIDEX ) 0.05 % [id number] No Apply 1 Application topically 2 (two) times daily. Roise Cleaver, MD Past Month Active Self  Fluticasone -Umeclidin-Vilant (TRELEGY ELLIPTA ) 100-62.5-25 MCG/ACT AEPB [id number] No Inhale 1 puff into the lungs daily in the afternoon. Sood, Vineet, MD 10/01/2023 Active Self  ipratropium-albuterol  (DUONEB) 0.5-2.5 (3) MG/3ML SOLN [id number] No Take 3 mLs by nebulization 3 (three) times daily. Rayfield Cairo, MD Past Week Active   moxifloxacin  (AVELOX ) 400 MG tablet [id number] No Take 1 tablet (400 mg total) by mouth daily. Roise Cleaver, MD Past Month Active   OXYGEN  [id number] No Inhale 2 L into the lungs at bedtime. Can use in the morning as needed [provider] 10/01/2023 Active Self           Med Note [facility], CARLOS A   Fri Aug 25, 2021 10:48 AM)    pantoprazole  (PROTONIX ) 40 MG tablet [id number] No Take 1 tablet (40 mg total) by mouth daily. Demaris Fillers, MD 10/01/2023 Active Self  polyethylene glycol (MIRALAX  / GLYCOLAX ) 17 g packet [id number] No Take 17 g by mouth daily as needed for mild constipation. Rayfield Cairo, MD Past Week Active   predniSONE  (DELTASONE ) 20 MG tablet [id number] No Take 3 PO QAM x3days, 2 PO QAM x3days, 1 PO QAM x3days  Patient not taking: Reported on 09/19/2023   Rayfield Cairo, MD Unknown Active   sacubitril -valsartan  (ENTRESTO ) 24-26 MG [id number] No TAKE 1 TABLET BY MOUTH TWICE DAILY. Nishan, Peter C, MD 10/02/2023 Morning Active Self  triamterene -hydrochlorothiazide (MAXZIDE-25) 37.5-25 MG tablet [id number] No Take  1 tablet by mouth daily as needed (blood pressure and fluid). Rayfield Cairo, MD Past Week Active             Recommendation:   PCP Follow-up- 11/07/23  Follow Up Plan:   Telephone follow-up 10/10/23 @ 215 pm  Cecilie Coffee [facility], BSN RN Care Manager/ Transition of Care [place]/ [facility] [phone]

## 2023-10-03 NOTE — Patient Instructions (Signed)
 Visit Information  Thank you for taking time to visit with me today. Please don't hesitate to contact me if I can be of assistance to you before our next scheduled telephone appointment.  Our next appointment is by telephone on 10/10/23 at 215 pm  Following is a copy of your care plan:   Goals Addressed             This Visit's Progress    [facility] Transitions of Care (TOC) Care Plan       Problems:  Recent Hospitalization for treatment of COPD and Pneumonia Home health contacted pt and she states told them not to come out to her home, she's busy and has a lot going on Patient is high risk for falls, uses walker 10/03/23- pt reports " I'm doing very well", no new concerns reported, denies dyspnea or cough, continues weighing daily, checks oxygen  saturation at times  Goal:  Over the next 30 days, the patient will not experience hospital readmission  Interventions:   COPD Interventions: Advised patient to track and manage COPD triggers Advised patient to self assesses COPD action plan zone and make appointment with provider if in the yellow zone for 48 hours without improvement Discussed the importance of adequate rest and management of fatigue with COPD Use of home oxygen  Reinforced COPD action plan Reinforced safety precautions Reviewed oxygen  safety Reinforced signs/ symptoms infection, pneumonia Reviewed upcoming scheduled appointments including primary care provider 11/07/23  Patient Self Care Activities:  Attend all scheduled provider appointments Attend church or other social activities Call pharmacy for medication refills 3-7 days in advance of running out of medications Call provider office for new concerns or questions  Notify RN Care Manager of TOC call rescheduling needs Participate in Transition of Care Program/Attend TOC scheduled calls Take medications as prescribed   identify and remove indoor air pollutants listen for public air quality announcements every  day develop a rescue plan follow rescue plan if symptoms flare-up eat healthy/prescribed diet: heart healthy, low sodium get at least 7 to 8 hours of sleep at night Use oxygen  as prescribed, be careful of tubing so you do not fall Primary care provider scheduled 11/07/23 @ 110 pm Follow COPD action plan Continue weighing, checking oxygen  saturation  Plan:  Telephone follow up appointment with care management team member scheduled for:  10/10/23 @ 215 pm The patient has been provided with contact information for the care management team and has been advised to call with any health related questions or concerns.         Patient verbalizes understanding of instructions and care plan provided today and agrees to view in MyChart. Active MyChart status and patient understanding of how to access instructions and care plan via MyChart confirmed with patient.     Telephone follow up appointment with care management team member scheduled for: 10/10/23 @ 215 pm  Please call the care guide team at [phone] if you need to cancel or reschedule your appointment.   Please call the Suicide and Crisis Lifeline: 988 call the [place]  National Suicide Prevention Lifeline: 3366805197 or TTY: 334-655-1076 TTY 819-421-4109) to talk to a trained counselor call 1-800-273-TALK (toll free, 24 hour hotline) go to [facility] [address], [place] [phone]) call the [facility] Crisis Line: ([phone] call 911 if you are experiencing a Mental Health or Behavioral Health Crisis or need someone to talk to.  Cecilie Coffee RNC, BSN RN Care Manager/ Transition of Care [place]/ [facility] Population  Health [phone]

## 2023-10-10 ENCOUNTER — Other Ambulatory Visit: Payer: Self-pay | Admitting: *Deleted

## 2023-10-10 NOTE — Patient Instructions (Signed)
 Visit Information  Thank you for taking time to visit with me today. Please don't hesitate to contact me if I can be of assistance to you before our next scheduled telephone appointment.  Our next appointment is no further scheduled appointments.    Following is a copy of your care plan:   Goals Addressed             This Visit's Progress    COMPLETED: [facility] Transitions of Care (TOC) Care Plan       Problems:  Recent Hospitalization for treatment of COPD and Pneumonia Home health contacted pt and she states told them not to come out to her home, she's busy and has a lot going on Patient is high risk for falls, uses walker 10/03/23- pt reports " I'm doing very well", no new concerns reported, denies dyspnea or cough, continues weighing daily, checks oxygen  saturation at times 10/10/23- pt reports "doing okay"  no new concerns reported, states she has not weighed over the past few days but will try to be more consistent, continues using oxygen , checks 02 saturation prn  Goal:  Over the next 30 days, the patient will not experience hospital readmission  Interventions:   COPD Interventions: Advised patient to track and manage COPD triggers Advised patient to self assesses COPD action plan zone and make appointment with provider if in the yellow zone for 48 hours without improvement Discussed the importance of adequate rest and management of fatigue with COPD Use of home oxygen  Reviewed COPD action plan Reviewed safety precautions Reinforced oxygen  safety Reviewed signs/ symptoms infection, pneumonia Reviewed upcoming scheduled appointments including primary care provider 11/07/23 Reviewed plan of care with pt including case closure today, pt declines transfer to longitudinal care management  Patient Self Care Activities:  Attend all scheduled provider appointments Attend church or other social activities Call pharmacy for medication refills 3-7 days in advance of running out of  medications Call provider office for new concerns or questions  Notify RN Care Manager of TOC call rescheduling needs Participate in Transition of Care Program/Attend TOC scheduled calls Take medications as prescribed   identify and remove indoor air pollutants listen for public air quality announcements every day develop a rescue plan follow rescue plan if symptoms flare-up eat healthy/prescribed diet: heart healthy, low sodium get at least 7 to 8 hours of sleep at night Use oxygen  as prescribed, be careful of tubing so you do not fall Primary care provider scheduled 11/07/23 @ 110 pm Follow COPD action plan Continue weighing, checking oxygen  saturation  Plan:  No further follow up required: case closure The patient has been provided with contact information for the care management team and has been advised to call with any health related questions or concerns.         Patient verbalizes understanding of instructions and care plan provided today and agrees to view in MyChart. Active MyChart status and patient understanding of how to access instructions and care plan via MyChart confirmed with patient.     No further follow up required: case closure  Please call the care guide team at [phone] if you need to cancel or reschedule your appointment.   Please call the Suicide and Crisis Lifeline: 988 call the [place]  National Suicide Prevention Lifeline: (212) 351-1137 or TTY: 4095056806 TTY (564)526-5961) to talk to a trained counselor call 1-800-273-TALK (toll free, 24 hour hotline) go to [facility] [address] [place] [phone]) call the [facility] Crisis Line: 229-782-5309  call 911 if you are experiencing a Mental Health or Behavioral Health Crisis or need someone to talk to.  Cecilie Coffee [facility], BSN RN Care Manager/ Transition of Care [place]/ [facility] [phone]

## 2023-10-10 NOTE — Patient Outreach (Signed)
 Transition of Care week 5  Visit Note  10/10/2023  Name: [name] MRN: [id number]          DOB: 1936/01/07  Situation: Patient enrolled in [facility] 30-day program. Visit completed with patient by telephone.   Background:    Past Medical History:  Diagnosis Date   Acute on chronic respiratory failure with hypoxia (HCC) 04/24/2018   Anxiety    Atypical chest pain    Bronchospasm    CAD (coronary artery disease)    a. s/p DES to mid-LAD and DES to mid-OM1 in 11/2016   Cervical disc disorder with myelopathy, unspecified cervical region    Cervicalgia 01/19/2009   Qualifier: Diagnosis of  By: Phyllis Breeze MD, [place]     Chest pain at rest 01/27/2015   Chronic systolic (congestive) heart failure (HCC)    COPD (chronic obstructive pulmonary disease) (HCC)    Coronary artery disease    De Quervain's disease (tenosynovitis) 04/02/2011   Disc disease with myelopathy, cervical    Essential hypertension    Hemorrhoids    Liver mass    Lung, cysts, congenital    Left lung cyst   Myocardial infarction (HCC)    Nephrolithiasis    Embedded   Nonischemic cardiomyopathy (HCC)    LVEF 35-40% 2015   On home O2    Osteoarthritis    Sprain of wrist 08/07/2012   Thoracic ascending aortic aneurysm (HCC)    4.3 cm April 2016    Assessment: Patient Reported Symptoms: Cognitive Cognitive Status: Able to follow simple commands, Alert and oriented to person, place, and time, Normal speech and language skills      Neurological Neurological Review of Symptoms: No symptoms reported    HEENT HEENT Symptoms Reported: No symptoms reported      Cardiovascular Cardiovascular Symptoms Reported: No symptoms reported Does patient have uncontrolled Hypertension?: No Cardiovascular Conditions: Heart failure Cardiovascular Management Strategies: Adequate rest, Routine screening Cardiovascular Self-Management Outcome: 3 (uncertain) Cardiovascular Comment: pt states she has not weighed recently- RN Care  Manager encouraged pt to weigh daily and record  Respiratory Respiratory Symptoms Reported: No symptoms reported Other Respiratory Symptoms: pt denies dyspnea or cough Additional Respiratory Details: oxygen  2 liters via [place] at hs and prn Respiratory Conditions: COPD Respiratory Self-Management Outcome: 3 (uncertain)  Endocrine Patient reports the following symptoms related to hypoglycemia or hyperglycemia : No symptoms reported    Gastrointestinal Gastrointestinal Symptoms Reported: No symptoms reported   Nutrition Risk Screen (CP): No indicators present  Genitourinary Genitourinary Symptoms Reported: No symptoms reported    Integumentary Integumentary Symptoms Reported: No symptoms reported    Musculoskeletal Musculoskelatal Symptoms Reviewed: No symptoms reported Additional Musculoskeletal Details: uses walker Musculoskeletal Management Strategies: Routine screening, Adequate rest Musculoskeletal Self-Management Outcome: 3 (uncertain)      Psychosocial Psychosocial Symptoms Reported: No symptoms reported         There were no vitals filed for this visit.  Medications Reviewed Today     Reviewed by Daralyn Earl, RN (Registered Nurse) on 10/10/23 at 1416  Med List Status: <None>   Medication Order Taking? Sig Documenting Provider Last Dose Status Informant  albuterol  (VENTOLIN  HFA) 108 (90 Base) MCG/ACT inhaler [id number] No Inhale 2 puffs into the lungs every 4 (four) hours as needed for wheezing or shortness of breath. Rayfield Cairo, MD 10/01/2023 Active   alendronate  (FOSAMAX ) 70 MG tablet [id number] No Take 1 tablet (70 mg total) by mouth every 7 (seven) days. Take with a full glass of water   on an empty stomach. Do not lay down for at least 2 hours Roise Cleaver, MD Past Week Active Self  aspirin  EC 81 MG tablet [id number] No Take 81 mg by mouth every morning.  [provider] 10/01/2023 Active Self           Med Note Author Board, Wheeler Hammonds Feb 28, 2023  2:11 PM)     bisoprolol  (ZEBETA ) 5 MG tablet [id number] No Take 0.5 tablets (2.5 mg total) by mouth daily. Rayfield Cairo, MD 10/01/2023 Active   cetirizine  (ZYRTEC  ALLERGY) 10 MG tablet [id number] No Take 1 tablet (10 mg total) by mouth daily. For itching Roise Cleaver, MD 10/01/2023 Active Self  clorazepate  (TRANXENE ) 7.5 MG tablet [id number] No Take 1 tablet (7.5 mg total) by mouth daily as needed for anxiety. Roise Cleaver, MD 10/01/2023 Active Self  dicyclomine  (BENTYL ) 10 MG capsule [id number] No Take 1 capsule (10 mg total) by mouth every 12 (twelve) hours as needed (abdominal pain). Castaneda Mayorga, Bearl Limes, MD Past Week Active Self  fenofibrate  (TRICOR ) 48 MG tablet [id number] No Take 1 tablet (48 mg total) by mouth daily. Roise Cleaver, MD 10/01/2023 Active Self  fluocinonide  cream (LIDEX ) 0.05 % [id number] No Apply 1 Application topically 2 (two) times daily. Roise Cleaver, MD Past Month Active Self  Fluticasone -Umeclidin-Vilant (TRELEGY ELLIPTA ) 100-62.5-25 MCG/ACT AEPB [id number] No Inhale 1 puff into the lungs daily in the afternoon. Sood, Vineet, MD 10/01/2023 Active Self  ipratropium-albuterol  (DUONEB) 0.5-2.5 (3) MG/3ML SOLN [id number] No Take 3 mLs by nebulization 3 (three) times daily. Rayfield Cairo, MD Past Week Active   moxifloxacin  (AVELOX ) 400 MG tablet [id number] No Take 1 tablet (400 mg total) by mouth daily. Roise Cleaver, MD Past Month Active   OXYGEN  [id number] No Inhale 2 L into the lungs at bedtime. Can use in the morning as needed [provider] 10/01/2023 Active Self           Med Note [facility], CARLOS A   Fri Aug 25, 2021 10:48 AM)    pantoprazole  (PROTONIX ) 40 MG tablet [id number] No Take 1 tablet (40 mg total) by mouth daily. Demaris Fillers, MD 10/01/2023 Active Self  polyethylene glycol (MIRALAX  / GLYCOLAX ) 17 g packet [id number] No Take 17 g by mouth daily as needed for mild constipation. Rayfield Cairo, MD Past Week Active   predniSONE  (DELTASONE ) 20 MG  tablet [id number] No Take 3 PO QAM x3days, 2 PO QAM x3days, 1 PO QAM x3days  Patient not taking: Reported on 09/19/2023   Rayfield Cairo, MD Unknown Active   sacubitril -valsartan  (ENTRESTO ) 24-26 MG [id number] No TAKE 1 TABLET BY MOUTH TWICE DAILY. Nishan, Peter C, MD 10/02/2023 Morning Active Self  triamterene -hydrochlorothiazide (MAXZIDE-25) 37.5-25 MG tablet [id number] No Take 1 tablet by mouth daily as needed (blood pressure and fluid). Rayfield Cairo, MD Past Week Active             Goals Addressed             This Visit's Progress    COMPLETED: [facility] Transitions of Care (TOC) Care Plan       Problems:  Recent Hospitalization for treatment of COPD and Pneumonia Home health contacted pt and she states told them not to come out to her home, she's busy and has a lot going on Patient is high risk for falls, uses walker 10/03/23- pt reports " I'm doing very well", no new concerns reported, denies dyspnea or cough, continues weighing daily, checks oxygen  saturation  at times 10/10/23- pt reports "doing okay"  no new concerns reported, states she has not weighed over the past few days but will try to be more consistent, continues using oxygen , checks 02 saturation prn  Goal:  Over the next 30 days, the patient will not experience hospital readmission  Interventions:   COPD Interventions: Advised patient to track and manage COPD triggers Advised patient to self assesses COPD action plan zone and make appointment with provider if in the yellow zone for 48 hours without improvement Discussed the importance of adequate rest and management of fatigue with COPD Use of home oxygen  Reviewed COPD action plan Reviewed safety precautions Reinforced oxygen  safety Reviewed signs/ symptoms infection, pneumonia Reviewed upcoming scheduled appointments including primary care provider 11/07/23 Reviewed plan of care with pt including case closure today, pt declines transfer to longitudinal care  management  Patient Self Care Activities:  Attend all scheduled provider appointments Attend church or other social activities Call pharmacy for medication refills 3-7 days in advance of running out of medications Call provider office for new concerns or questions  Notify RN Care Manager of TOC call rescheduling needs Participate in Transition of Care Program/Attend TOC scheduled calls Take medications as prescribed   identify and remove indoor air pollutants listen for public air quality announcements every day develop a rescue plan follow rescue plan if symptoms flare-up eat healthy/prescribed diet: heart healthy, low sodium get at least 7 to 8 hours of sleep at night Use oxygen  as prescribed, be careful of tubing so you do not fall Primary care provider scheduled 11/07/23 @ 110 pm Follow COPD action plan Continue weighing, checking oxygen  saturation  Plan:  No further follow up required: case closure The patient has been provided with contact information for the care management team and has been advised to call with any health related questions or concerns.         Recommendation:   PCP Follow-up  Follow Up Plan:   Closing From:  Transitions of Care Program  Cecilie Coffee [facility], BSN RN Care Manager/ Transition of Care [place]/ [facility] Population [facility] [phone]

## 2023-10-11 ENCOUNTER — Other Ambulatory Visit: Payer: Self-pay | Admitting: Family Medicine

## 2023-10-11 DIAGNOSIS — F419 Anxiety disorder, unspecified: Secondary | ICD-10-CM

## 2023-10-31 ENCOUNTER — Ambulatory Visit (INDEPENDENT_AMBULATORY_CARE_PROVIDER_SITE_OTHER)

## 2023-10-31 DIAGNOSIS — I2510 Atherosclerotic heart disease of native coronary artery without angina pectoris: Secondary | ICD-10-CM

## 2023-10-31 DIAGNOSIS — I5042 Chronic combined systolic (congestive) and diastolic (congestive) heart failure: Secondary | ICD-10-CM

## 2023-10-31 DIAGNOSIS — J432 Centrilobular emphysema: Secondary | ICD-10-CM

## 2023-10-31 DIAGNOSIS — F419 Anxiety disorder, unspecified: Secondary | ICD-10-CM

## 2023-10-31 DIAGNOSIS — Q330 Congenital cystic lung: Secondary | ICD-10-CM

## 2023-10-31 DIAGNOSIS — I255 Ischemic cardiomyopathy: Secondary | ICD-10-CM

## 2023-10-31 DIAGNOSIS — M5000 Cervical disc disorder with myelopathy, unspecified cervical region: Secondary | ICD-10-CM

## 2023-10-31 DIAGNOSIS — I7121 Aneurysm of the ascending aorta, without rupture: Secondary | ICD-10-CM

## 2023-10-31 DIAGNOSIS — I110 Hypertensive heart disease with heart failure: Secondary | ICD-10-CM

## 2023-10-31 DIAGNOSIS — I447 Left bundle-branch block, unspecified: Secondary | ICD-10-CM

## 2023-10-31 DIAGNOSIS — I428 Other cardiomyopathies: Secondary | ICD-10-CM

## 2023-10-31 DIAGNOSIS — J9611 Chronic respiratory failure with hypoxia: Secondary | ICD-10-CM

## 2023-11-06 ENCOUNTER — Telehealth (INDEPENDENT_AMBULATORY_CARE_PROVIDER_SITE_OTHER): Payer: Self-pay

## 2023-11-06 NOTE — Telephone Encounter (Signed)
 Patient says she has not gotten her results of the recent IR. She would like a call today to know the results of this. She says she would like someone to call her as she needs to have this information to be able to talk with Dr. Matthews. [phone].

## 2023-11-07 ENCOUNTER — Ambulatory Visit (INDEPENDENT_AMBULATORY_CARE_PROVIDER_SITE_OTHER): Admitting: Family Medicine

## 2023-11-07 ENCOUNTER — Encounter: Payer: Self-pay | Admitting: Family Medicine

## 2023-11-07 VITALS — BP 107/49 | HR 65 | Temp 98.0°F | Ht 60.0 in | Wt 158.0 lb

## 2023-11-07 DIAGNOSIS — F419 Anxiety disorder, unspecified: Secondary | ICD-10-CM | POA: Diagnosis not present

## 2023-11-07 DIAGNOSIS — R7303 Prediabetes: Secondary | ICD-10-CM | POA: Diagnosis not present

## 2023-11-07 MED ORDER — CLORAZEPATE DIPOTASSIUM 7.5 MG PO TABS: 7.5000 mg | ORAL_TABLET | Freq: Two times a day (BID) | ORAL | 2 refills | Status: DC | PRN

## 2023-11-07 NOTE — Progress Notes (Signed)
 Subjective:  Patient ID: [name], female    DOB: Jul 31, 1935  Age: 88 y.o. MRN: [id number]  CC: Medical Management of Chronic Issues (Pt brought records with her. Would like to go over with you. Pt states that she had a procedure. Anesthesia did not work. Still sore from procedure.)   HPI [name] presents for pain in right inguinal crease. Sx ongoing. For several weeks to months.      09/11/2023    2:28 PM 08/08/2023   10:27 AM 07/11/2023    3:37 PM  Depression screen PHQ 2/9  Decreased Interest 0 1 1  Down, Depressed, Hopeless 1 1 1   PHQ - 2 Score 1 2 2   Altered sleeping  1 1  Tired, decreased energy  2 3  Change in appetite  1 1  Feeling bad or failure about yourself   0 1  Trouble concentrating  3 3  Moving slowly or fidgety/restless  2 0  Suicidal thoughts  0 0  PHQ-9 Score  11 11  Difficult doing work/chores  Somewhat difficult Not difficult at all    History [name] has a past medical history of Acute on chronic respiratory failure with hypoxia (HCC) (04/24/2018), Anxiety, Atypical chest pain, Bronchospasm, CAD (coronary artery disease), Cervical disc disorder with myelopathy, unspecified cervical region, Cervicalgia (01/19/2009), Chest pain at rest (01/27/2015), Chronic systolic (congestive) heart failure (HCC), COPD (chronic obstructive pulmonary disease) (HCC), Coronary artery disease, De Quervain's disease (tenosynovitis) (04/02/2011), Disc disease with myelopathy, cervical, Essential hypertension, Hemorrhoids, Liver mass, Lung, cysts, congenital, Myocardial infarction (HCC), Nephrolithiasis, Nonischemic cardiomyopathy (HCC), On home O2, Osteoarthritis, Sprain of wrist (08/07/2012), and Thoracic ascending aortic aneurysm (HCC).   She has a past surgical history that includes Tonsillectomy and adenoidectomy; Complete hysterectomy; Benign breast tumors; Cholecystectomy; Colonoscopy; Colonoscopy (N/A, 09/22/2014); LEFT HEART CATH AND CORONARY ANGIOGRAPHY (N/A, 11/23/2016);  CORONARY STENT INTERVENTION (N/A, 11/23/2016); Colonoscopy with propofol  (N/A, 04/16/2023); Polypectomy (04/16/2023); IR Radiologist Eval & Mgmt (09/18/2023); IR INTRAVASCULAR ULTRASOUND NON CORONARY (10/02/2023); IR Angiogram Visceral Selective (10/02/2023); IR US  Guide Vasc Access Right (10/02/2023); IR Angiogram Visceral Selective (10/02/2023); IR Angiogram Selective Each Additional Vessel (10/02/2023); and IR Angiogram Selective Each Additional Vessel (10/02/2023).   Her family history includes Aneurysm in her father; Diabetes in her brother; Heart disease in her brother, mother, and sister; Lung cancer in her brother.She reports that she quit smoking about 15 years ago. Her smoking use included cigarettes. She started smoking about 46 years ago. She has a 7.8 pack-year smoking history. She has never used smokeless tobacco. She reports that she does not drink alcohol and does not use drugs.    ROS Review of Systems  Constitutional: Negative.   HENT: Negative.  Negative for congestion.   Eyes:  Negative for visual disturbance.  Respiratory:  Negative for shortness of breath.   Cardiovascular:  Negative for chest pain.  Gastrointestinal:  Negative for abdominal pain (Right inguinal), constipation, diarrhea, nausea and vomiting.  Genitourinary:  Negative for difficulty urinating.  Musculoskeletal:  Positive for arthralgias. Negative for myalgias.  Neurological:  Negative for headaches.  Psychiatric/Behavioral:  Negative for sleep disturbance.     Objective:  BP (!) 107/49   Pulse 65   Temp 98 F (36.7 C)   Ht 5' (1.524 m)   Wt 158 lb (71.7 kg)   SpO2 92%   BMI 30.86 kg/m   BP Readings from Last 3 Encounters:  11/07/23 (!) 107/49  10/02/23 (!) 121/90  09/19/23 139/74    Wt  Readings from Last 3 Encounters:  11/07/23 158 lb (71.7 kg)  09/19/23 158 lb (71.7 kg)  09/05/23 159 lb 2.8 oz (72.2 kg)     Physical Exam Constitutional:      General: She is not in acute distress.     Appearance: She is well-developed.   Cardiovascular:     Rate and Rhythm: Normal rate and regular rhythm.  Pulmonary:     Breath sounds: Normal breath sounds.  Abdominal:     Tenderness: There is abdominal tenderness (RLQ).   Musculoskeletal:        General: Normal range of motion.   Skin:    General: Skin is warm and dry.   Neurological:     Mental Status: She is alert and oriented to person, place, and time.      Assessment & Plan:  Prediabetes  Anxiety -     Clorazepate  Dipotassium; Take 1 tablet (7.5 mg total) by mouth 2 (two) times daily as needed for anxiety.  Dispense: 60 tablet; Refill: 2     Follow-up: Return in about 3 months (around 02/07/2024) for Anxiety.  Butler Der, M.D.

## 2023-11-08 ENCOUNTER — Telehealth: Payer: Self-pay

## 2023-11-08 NOTE — Telephone Encounter (Signed)
 Called and spoke to [employer] and they have the it and will get it ready for her. Patient was also called and told her verbalized understanding.

## 2023-11-08 NOTE — Telephone Encounter (Signed)
 Copied from CRM [id number]. Topic: Clinical - Prescription Issue >> Nov 08, 2023 11:44 AM Ivette P wrote: Reason for CRM: PT called  in about medicaiton clorazepate  (TRANXENE ) 7.5 MG tablet    Pt said she called her pharmacy [facility] and they stated they did not received, advised pt it was confirmed. Pt said she just called them today 06/27 and they said no prescription was sent   Please follow up with pt.     Confirmation:  clorazepate  (TRANXENE ) 7.5 MG tablet 60 tablet 2 11/07/2023 -  Sig - Route: Take 1 tablet (7.5 mg total) by mouth 2 (two) times daily as needed for anxiety. - Oral  Sent to pharmacy as: clorazepate  (TRANXENE ) 7.5 MG tablet  E-Prescribing Status: Receipt confirmed by pharmacy (11/07/2023  2:08 PM EDT)

## 2023-11-10 ENCOUNTER — Encounter: Payer: Self-pay | Admitting: Family Medicine

## 2023-11-11 NOTE — Telephone Encounter (Signed)
 I spoke with the patient and made her aware,  I called the patient but she did not answer my call.  Could not leave a voice message. The most recent IR evaluation showed normal arteries in her abdomen, not consistent with chronic mesenteric ischemia       Patient states understanding.

## 2023-11-11 NOTE — Telephone Encounter (Signed)
 I called the patient but she did not answer my call.  Could not leave a voice message. The most recent IR evaluation showed normal arteries in her abdomen, not consistent with chronic mesenteric ischemia

## 2023-11-11 NOTE — Telephone Encounter (Signed)
 Jenkins would you mind sending the patient a copy of the 11/06/2023 telephone note of this conversation. Per her request. Thanks

## 2023-11-12 NOTE — Telephone Encounter (Signed)
 Printed and mailed to patient.

## 2023-11-25 ENCOUNTER — Telehealth: Payer: Self-pay | Admitting: Cardiovascular Disease

## 2023-11-25 MED ORDER — BISOPROLOL FUMARATE 5 MG PO TABS: 2.5000 mg | ORAL_TABLET | Freq: Every day | ORAL | 1 refills | Status: DC

## 2023-11-25 NOTE — Telephone Encounter (Signed)
 Pt c/o medication issue:  1. Name of Medication: Meclizine  25 mg  2. How are you currently taking this medication (dosage and times per day)? 1 Tablet by mouth as needed for dizziness  3. Are you having a reaction (difficulty breathing--STAT)? no  4. What is your medication issue? Pt is calling to have this medication refilled but it is not in her current med list.

## 2023-11-25 NOTE — Telephone Encounter (Signed)
*  STAT* If patient is at the pharmacy, call can be transferred to refill team.   1. Which medications need to be refilled? (please list name of each medication and dose if known)  bisoprolol  (ZEBETA ) 5 MG tablet   2. Which pharmacy/location (including street and city if local pharmacy) is medication to be sent to? [address] - [place], [place] - [address] Phone: [phone]  Fax: [phone]     3. Do they need a 30 day or 90 day supply? 90 Pt wants to know if medications comes in the correct dosage amount because she has a hard time cutting pill in two

## 2023-11-25 NOTE — Telephone Encounter (Signed)
 Pt aware, refilled as requested, pharmacy will split pill and deliver

## 2023-11-25 NOTE — Telephone Encounter (Signed)
 Not prescribed by [facility] follow up with PCP    Refilled Bisoprolol  5 mg (sig: take 1/2 tablet,2.5 mg total)  daily to [place] ,they will split pill for her and deliver

## 2023-11-28 DIAGNOSIS — J449 Chronic obstructive pulmonary disease, unspecified: Secondary | ICD-10-CM | POA: Diagnosis not present

## 2023-12-06 ENCOUNTER — Encounter: Payer: Self-pay | Admitting: Obstetrics & Gynecology

## 2023-12-06 ENCOUNTER — Ambulatory Visit: Admitting: Obstetrics & Gynecology

## 2023-12-06 VITALS — BP 126/66 | HR 73 | Ht 62.0 in | Wt 156.0 lb

## 2023-12-06 DIAGNOSIS — N764 Abscess of vulva: Secondary | ICD-10-CM

## 2023-12-06 MED ORDER — SULFAMETHOXAZOLE-TRIMETHOPRIM 800-160 MG PO TABS: 1.0000 | ORAL_TABLET | Freq: Two times a day (BID) | ORAL | 0 refills | Status: AC

## 2023-12-06 NOTE — Progress Notes (Signed)
 GYN VISIT Patient name: [name] MRN [id number]  Date of birth: 1935/12/28 Chief Complaint:   ? boil  History of Present Illness:   [name] is a 88 y.o. G3P3002 PM, PH female being seen today for the following concerns:     Noted something at the top of her pelvis- it has been there for about a week.  Notes this it has gotten bigger and seem to have a black head.  Also noted soreness and itch.  Denies fever/chills.  No other acute complaints  No LMP recorded. Patient has had a hysterectomy.    Review of Systems:   Pertinent items are noted in HPI Denies fever/chills, dizziness, headaches, visual disturbances, fatigue, shortness of breath, chest pain, abdominal pain, vomiting` Pertinent History Reviewed:   Past Surgical History:  Procedure Laterality Date   Benign breast tumors     CHOLECYSTECTOMY     COLONOSCOPY     COLONOSCOPY N/A 09/22/2014   Procedure: COLONOSCOPY;  Surgeon: Claudis RAYMOND Rivet, MD;  [facility];  Service: Endoscopy;  Laterality: N/A;  830 -- to be done in OR under fluoro   COLONOSCOPY WITH PROPOFOL  N/A 04/16/2023   Procedure: COLONOSCOPY WITH PROPOFOL ;  Surgeon: Eartha Angelia Sieving, MD;  Location: [facility];  Service: Gastroenterology;  Laterality: N/A;  11:00am, asa 3   Complete hysterectomy     CORONARY STENT INTERVENTION N/A 11/23/2016   Procedure: Coronary Stent Intervention;  Surgeon: Swaziland, Peter M, MD;  Location: [facility] INVASIVE CV LAB;  Service: Cardiovascular;  Laterality: N/A;   IR ANGIOGRAM SELECTIVE EACH ADDITIONAL VESSEL  10/02/2023   IR ANGIOGRAM SELECTIVE EACH ADDITIONAL VESSEL  10/02/2023   IR ANGIOGRAM VISCERAL SELECTIVE  10/02/2023   IR ANGIOGRAM VISCERAL SELECTIVE  10/02/2023   IR INTRAVASCULAR ULTRASOUND NON CORONARY  10/02/2023   IR RADIOLOGIST EVAL & MGMT  09/18/2023   IR US  GUIDE VASC ACCESS RIGHT  10/02/2023   LEFT HEART CATH AND CORONARY ANGIOGRAPHY N/A 11/23/2016   Procedure: Left Heart Cath and Coronary  Angiography;  Surgeon: Swaziland, Peter M, MD;  Location: [facility] INVASIVE CV LAB;  Service: Cardiovascular;  Laterality: N/A;   POLYPECTOMY  04/16/2023   Procedure: POLYPECTOMY INTESTINAL;  Surgeon: Eartha Angelia, Sieving, MD;  Location: [facility];  Service: Gastroenterology;;   TONSILLECTOMY AND ADENOIDECTOMY      Past Medical History:  Diagnosis Date   Acute on chronic respiratory failure with hypoxia (HCC) 04/24/2018   Anxiety    Atypical chest pain    Bronchospasm    CAD (coronary artery disease)    a. s/p DES to mid-LAD and DES to mid-OM1 in 11/2016   Cervical disc disorder with myelopathy, unspecified cervical region    Cervicalgia 01/19/2009   Qualifier: Diagnosis of  By: Margrette MD, Stanley     Chest pain at rest 01/27/2015   Chronic systolic (congestive) heart failure (HCC)    COPD (chronic obstructive pulmonary disease) (HCC)    Coronary artery disease    De Quervain's disease (tenosynovitis) 04/02/2011   Disc disease with myelopathy, cervical    Essential hypertension    Hemorrhoids    Liver mass    Lung, cysts, congenital    Left lung cyst   Myocardial infarction (HCC)    Nephrolithiasis    Embedded   Nonischemic cardiomyopathy (HCC)    LVEF 35-40% 2015   On home O2    Osteoarthritis    Sprain of wrist 08/07/2012   Thoracic ascending aortic aneurysm (HCC)  4.3 cm April 2016   Reviewed problem list, medications and allergies. Physical Assessment:   Vitals:   12/06/23 1022  BP: 126/66  Pulse: 73  Weight: 156 lb (70.8 kg)  Height: 5' 2 (1.575 m)  Body mass index is 28.53 kg/m.       Physical Examination:   General appearance: alert, well appearing, and in no distress  Psych: mood appropriate, normal affect  Skin: warm & dry   Cardiovascular: normal heart rate noted  Respiratory: normal respiratory effort, no distress  Abdomen: soft, non-tender   Pelvic: At mons above clitoral hood approximately 1 cm raised erythematous indurated area noted with dried  blood at the head of the boil  Extremities: no edema   Chaperone: Nidia Daring    I&D PROCEDURE Verbal informed consent was obtained.  Discussed complications and possible outcomes of procedure including recurrence, scarring leading to infection, bleeding. Patient was examined in the dorsal lithotomy position and mass was identified.  The area was prepped with Iodine. 1% Lidocaine  (3 ml) was then used to infiltrate area on top of the abscess.  An 11 blade was used to make a small incision within the abscess.  Drainage of minimal blood-tinged fluid was noted.  Area was copiously irrigated.  Patient tolerated the procedure well, reported feeling  a lot better.   Assessment & Plan:  1) Vulvar abscess - Discussed conservative versus I&D.  Patient would prefer I&D procedure - Verbal informed consent obtained, procedure completed as above - Bactrim  DS bid x 7 days for treatment - Will plan to follow-up in approximately 2 weeks   No orders of the defined types were placed in this encounter.   Return in about 2 weeks (around 12/20/2023) for follow up.   Michale Emmerich, DO Attending Obstetrician & Gynecologist, [facility] for [employer], [facility]

## 2023-12-19 ENCOUNTER — Ambulatory Visit: Admitting: Obstetrics & Gynecology

## 2024-01-07 ENCOUNTER — Telehealth: Payer: Self-pay

## 2024-01-07 ENCOUNTER — Other Ambulatory Visit: Payer: Self-pay | Admitting: Cardiovascular Disease

## 2024-01-07 NOTE — Telephone Encounter (Signed)
 Copied from CRM #[id number]. Topic: Clinical - Medication Question >> Jan 07, 2024 10:16 AM Leonette SQUIBB wrote: Reason for CRM: Patient called saying she would like 2 medication that have been prescribed in the past.  One is for dizziness.  I think she is saying Antivert .  The other is for pain in her stomach for diverticulitis.  CB# [phone]

## 2024-01-08 ENCOUNTER — Telehealth: Payer: Self-pay | Admitting: Family Medicine

## 2024-01-08 ENCOUNTER — Other Ambulatory Visit: Payer: Self-pay | Admitting: Family Medicine

## 2024-01-08 MED ORDER — MECLIZINE HCL 25 MG PO TABS: 25.0000 mg | ORAL_TABLET | Freq: Three times a day (TID) | ORAL | 2 refills | Status: AC | PRN

## 2024-01-08 NOTE — Telephone Encounter (Signed)
 I called and spoke with [name] and made her aware of Dr Zollie notes. [name] voiced understanding and made an appt to see Dr Zollie on 01/21/24 to get a Rx for tramadol .

## 2024-01-08 NOTE — Telephone Encounter (Signed)
 Copied from CRM #[id number]. Topic: Clinical - Medication Refill >> Jan 08, 2024 10:34 AM Rosaria E wrote: Medication: meclizine  (ANTIVERT ) 25 MG tablet   traMADol  (ULTRAM ) tablet 50 mg (needs at least 2 a day)  Has the patient contacted their pharmacy? Yes (Agent: If no, request that the patient contact the pharmacy for the refill. If patient does not wish to contact the pharmacy document the reason why and proceed with request.) (Agent: If yes, when and what did the pharmacy advise?)  This is the patient's preferred pharmacy:  [facility] - [place], [place] - [address] [place] [address] Phone: [phone] Fax: [phone]  Is this the correct pharmacy for this prescription? Yes If no, delete pharmacy and type the correct one.   Has the prescription been filled recently? Yes  Is the patient out of the medication? Yes  Has the patient been seen for an appointment in the last year OR does the patient have an upcoming appointment? Yes  Can we respond through MyChart? Yes  Agent: Please be advised that Rx refills may take up to 3 business days. We ask that you follow-up with your pharmacy.

## 2024-01-08 NOTE — Telephone Encounter (Signed)
 Antivert /meclizine  was refilled.  The tramadol  requires an office visit

## 2024-01-08 NOTE — Telephone Encounter (Signed)
 Pt requested medication refills on two medications not listed on med list:   Copied from CRM #[id number]. Topic: Clinical - Medication Refill >> Jan 08, 2024 10:34 AM Rosaria E wrote: Medication: meclizine  (ANTIVERT ) 25 MG tablet    traMADol  (ULTRAM ) tablet 50 mg (needs at least 2 a day)   Has the patient contacted their pharmacy? Yes (Agent: If no, request that the patient contact the pharmacy for the refill. If patient does not wish to contact the pharmacy document the reason why and proceed with request.) (Agent: If yes, when and what did the pharmacy advise?)   This is the patient's preferred pharmacy:  [facility] - [place], [place] - [address] [place] [address] Phone: [phone] Fax: [phone]   Is this the correct pharmacy for this prescription? Yes If no, delete pharmacy and type the correct one.    Has the prescription been filled recently? Yes   Is the patient out of the medication? Yes   Has the patient been seen for an appointment in the last year OR does the patient have an upcoming appointment? Yes   Can we respond through MyChart? Yes   Agent: Please be advised that Rx refills may take up to 3 business days. We ask that you follow-up with your pharmacy.

## 2024-01-20 ENCOUNTER — Other Ambulatory Visit: Payer: Self-pay

## 2024-01-20 ENCOUNTER — Telehealth: Payer: Self-pay

## 2024-01-20 MED ORDER — TRELEGY ELLIPTA 100-62.5-25 MCG/ACT IN AEPB: 1.0000 | INHALATION_SPRAY | Freq: Every day | RESPIRATORY_TRACT | 1 refills | Status: DC

## 2024-01-20 NOTE — Telephone Encounter (Signed)
 Copied from CRM [id number]. Topic: Clinical - Prescription Issue >> Jan 20, 2024 10:07 AM Leotis ORN wrote: Reason for CRM: patient is scheduled for [facility] appointment 02/24/2024, she is out of her trelegy, and is wanting to see if a bridge refill can be sent in until she can be seen her previous provider sood is no longer with practice, added TOC appointment to wait list and advised patient that she would need to be seen to establish care first, but she insisted that I send a message to see    patient callback [phone]   Please advise

## 2024-01-21 ENCOUNTER — Telehealth: Payer: Self-pay | Admitting: *Deleted

## 2024-01-21 ENCOUNTER — Ambulatory Visit (INDEPENDENT_AMBULATORY_CARE_PROVIDER_SITE_OTHER): Admitting: Family Medicine

## 2024-01-21 ENCOUNTER — Encounter: Payer: Self-pay | Admitting: Family Medicine

## 2024-01-21 VITALS — BP 131/61 | HR 68 | Temp 97.8°F | Ht 62.0 in | Wt 158.0 lb

## 2024-01-21 DIAGNOSIS — M7502 Adhesive capsulitis of left shoulder: Secondary | ICD-10-CM | POA: Diagnosis not present

## 2024-01-21 DIAGNOSIS — M0609 Rheumatoid arthritis without rheumatoid factor, multiple sites: Secondary | ICD-10-CM

## 2024-01-21 DIAGNOSIS — K551 Chronic vascular disorders of intestine: Secondary | ICD-10-CM

## 2024-01-21 MED ORDER — TRAMADOL HCL 50 MG PO TABS: 50.0000 mg | ORAL_TABLET | Freq: Two times a day (BID) | ORAL | 5 refills | Status: AC

## 2024-01-21 NOTE — Progress Notes (Signed)
 Subjective:  Patient ID: [name], female    DOB: October 16, 1935  Age: 88 y.o. MRN: [id number]  CC: Medical Management of Chronic Issues (Patient requesting tramadol  rx)   HPI  Discussed the use of AI scribe software for clinical note transcription with the patient, who gave verbal consent to proceed.  History of Present Illness [name] is an 88 year old female who presents with persistent abdominal pain.  [name] has been experiencing persistent abdominal pain for over a year, described as diffuse across her abdomen and exacerbated by the consumption of solid foods. The pain is severe enough to require her to hold her abdomen when getting up. [name] has adjusted her diet to primarily liquids, such as juice, which cause less discomfort.  A CT scan of her abdomen in April 2025 showed no signs of colon cancer. [name] recalls a previous consideration for stent placement due to blood flow issues, but the procedure was not performed as the vessels opened spontaneously. [name] is concerned about a recurrence of this issue due to ongoing significant pain.  Her appetite has decreased, and [name] is surprised by her current weight of 158 pounds, as [name] expected to weigh more. No recent weight loss, noting a gain of one pound since her last visit. [name] uses tramadol  for both her abdominal and knee pain. Her knee pain has worsened since a fall, and [name] now experiences her knees locking up.  Review of systems reveals decreased appetite and persistent abdominal pain. No recent weight loss, as [name] has gained a pound since her last visit.          09/11/2023    2:28 PM 08/08/2023   10:27 AM 07/11/2023    3:37 PM  Depression screen PHQ 2/9  Decreased Interest 0 1 1  Down, Depressed, Hopeless 1 1 1   PHQ - 2 Score 1 2 2   Altered sleeping  1 1  Tired, decreased energy  2 3  Change in appetite  1 1  Feeling bad or failure about yourself   0 1  Trouble concentrating  3 3  Moving slowly or fidgety/restless  2  0  Suicidal thoughts  0 0  PHQ-9 Score  11 11  Difficult doing work/chores  Somewhat difficult Not difficult at all    History [name] has a past medical history of Acute on chronic respiratory failure with hypoxia (HCC) (04/24/2018), Anxiety, Atypical chest pain, Bronchospasm, CAD (coronary artery disease), Cervical disc disorder with myelopathy, unspecified cervical region, Cervicalgia (01/19/2009), Chest pain at rest (01/27/2015), Chronic systolic (congestive) heart failure (HCC), COPD (chronic obstructive pulmonary disease) (HCC), Coronary artery disease, De Quervain's disease (tenosynovitis) (04/02/2011), Disc disease with myelopathy, cervical, Essential hypertension, Hemorrhoids, Liver mass, Lung, cysts, congenital, Myocardial infarction (HCC), Nephrolithiasis, Nonischemic cardiomyopathy (HCC), On home O2, Osteoarthritis, Sprain of wrist (08/07/2012), and Thoracic ascending aortic aneurysm (HCC).   [name] has a past surgical history that includes Tonsillectomy and adenoidectomy; Complete hysterectomy; Benign breast tumors; Cholecystectomy; Colonoscopy; Colonoscopy (N/A, 09/22/2014); LEFT HEART CATH AND CORONARY ANGIOGRAPHY (N/A, 11/23/2016); CORONARY STENT INTERVENTION (N/A, 11/23/2016); Colonoscopy with propofol  (N/A, 04/16/2023); Polypectomy (04/16/2023); IR Radiologist Eval & Mgmt (09/18/2023); IR INTRAVASCULAR ULTRASOUND NON CORONARY (10/02/2023); IR Angiogram Visceral Selective (10/02/2023); IR US  Guide Vasc Access Right (10/02/2023); IR Angiogram Visceral Selective (10/02/2023); IR Angiogram Selective Each Additional Vessel (10/02/2023); and IR Angiogram Selective Each Additional Vessel (10/02/2023).   Her family history includes Aneurysm in her father; Diabetes in her brother; Heart disease in her brother, mother, and sister;  Lung cancer in her brother.[name] reports that [name] quit smoking about 15 years ago. Her smoking use included cigarettes. [name] started smoking about 46 years ago. [name] has a 7.8 pack-year smoking  history. [name] has never used smokeless tobacco. [name] reports that [name] does not drink alcohol and does not use drugs.    ROS Review of Systems  Constitutional: Negative.   HENT: Negative.    Eyes:  Negative for visual disturbance.  Respiratory:  Negative for shortness of breath.   Cardiovascular:  Negative for chest pain.  Gastrointestinal:  Positive for abdominal pain.  Musculoskeletal:  Positive for arthralgias.    Objective:  BP 131/61   Pulse 68   Temp 97.8 F (36.6 C)   Ht 5' 2 (1.575 m)   Wt 158 lb (71.7 kg)   SpO2 95%   BMI 28.90 kg/m   BP Readings from Last 3 Encounters:  01/21/24 131/61  12/06/23 126/66  11/07/23 (!) 107/49    Wt Readings from Last 3 Encounters:  01/21/24 158 lb (71.7 kg)  12/06/23 156 lb (70.8 kg)  11/07/23 158 lb (71.7 kg)     Physical Exam Constitutional:      General: [name] is not in acute distress.    Appearance: [name] is well-developed.  Cardiovascular:     Rate and Rhythm: Normal rate and regular rhythm.  Pulmonary:     Breath sounds: Normal breath sounds.  Abdominal:     General: There is no distension.  Musculoskeletal:        General: Normal range of motion.  Skin:    General: Skin is warm and dry.  Neurological:     Mental Status: [name] is alert and oriented to person, place, and time.      Assessment & Plan:  Rheumatoid arthritis of multiple sites without rheumatoid factor (HCC) -     traMADol  HCl; Take 1 tablet (50 mg total) by mouth 2 (two) times daily.  Dispense: 60 tablet; Refill: 5  Adhesive capsulitis of left shoulder -     traMADol  HCl; Take 1 tablet (50 mg total) by mouth 2 (two) times daily.  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 Previous imaging in April ruled out colon cancer but confirmed vascular compromise with prior intervention. Symptoms suggest possible recurrence of vascular stenosis. Refer to Dr. Jennefer for further evaluation and potential intervention.  Rheumatoid arthritis, multiple sites   Chronic pain affects multiple joints, including knees, with episodes of locking, likely due to rheumatoid arthritis. Pain management is essential. [name] uses tramadol  for both rheumatoid and abdominal pain. Prescribe tramadol  twice daily for pain management with several refills.       Follow-up: No follow-ups on file.  Butler Der, M.D.

## 2024-01-21 NOTE — Telephone Encounter (Signed)
 Copied from CRM [id number]. Topic: Appointments - Transfer of Care >> Jan 20, 2024 10:06 AM Leotis ORN wrote: Pt is requesting to transfer FROM: [facility] Pt is requesting to transfer TO: [facility]  Reason for requested transfer: previous provider no longer with practice  It is the responsibility of the team the patient would like to transfer to (Dr. Shellia) to reach out to the patient if for any reason this transfer is not acceptable.

## 2024-01-28 ENCOUNTER — Encounter: Payer: Self-pay | Admitting: Internal Medicine

## 2024-01-28 ENCOUNTER — Ambulatory Visit (INDEPENDENT_AMBULATORY_CARE_PROVIDER_SITE_OTHER): Admitting: Internal Medicine

## 2024-01-28 ENCOUNTER — Other Ambulatory Visit: Payer: Self-pay | Admitting: Cardiovascular Disease

## 2024-01-28 VITALS — BP 122/61 | HR 73 | Ht 62.0 in | Wt 159.2 lb

## 2024-01-28 DIAGNOSIS — G4734 Idiopathic sleep related nonobstructive alveolar hypoventilation: Secondary | ICD-10-CM | POA: Diagnosis not present

## 2024-01-28 DIAGNOSIS — J4489 Other specified chronic obstructive pulmonary disease: Secondary | ICD-10-CM | POA: Diagnosis not present

## 2024-01-28 DIAGNOSIS — Z9981 Dependence on supplemental oxygen: Secondary | ICD-10-CM | POA: Diagnosis not present

## 2024-01-28 DIAGNOSIS — J432 Centrilobular emphysema: Secondary | ICD-10-CM

## 2024-01-28 NOTE — Progress Notes (Signed)
 [name], female    DOB: June 02, 1935    MRN: [id number]   Brief patient profile:  88   yowf  quit smoking in her 30s (record says 2010 former [facility] Pt self-referred back to pulmonary clinic in [place]  01/28/2024      Pulmonary testing:  PFT 08/31/11 >> FEV1 1.44 (70%),   0.75, TLC 5.32 (108%), DLCO 61% s any significant curvature to f/v loop    Cardiac Tests:  Echo 05/26/21 >> EF 35 to 40%, grade 1 DD, mod pericardial effusion, mild MR, mild AR   History of Present Illness  01/28/2024  Pulmonary/ 1st office eval/ [facility] / [place] [facility] trelegy but not sure helping  Chief Complaint  Patient presents with   COPD    F/u - med refills  Dyspnea:  shopping ok pushing cart / walk across parking lot  Cough: none  Sleep: bed is flat / one pillow  SABA use: goes for days without use  02 use:  2lpm hs per Dr Vonzell    No obvious day to day or daytime pattern/variability or assoc excess/ purulent sputum or mucus plugs or hemoptysis or cp or chest tightness, subjective wheeze or overt  hb symptoms.    Also denies any obvious fluctuation of symptoms with weather or environmental changes or other aggravating or alleviating factors except as outlined above   No unusual exposure hx or h/o childhood pna/ asthma or knowledge of premature birth.  Current Allergies, Complete Past Medical History, Past Surgical History, Family History, and Social History were reviewed in [employer] record.  ROS  The following are not active complaints unless bolded Hoarseness, sore throat, dysphagia, dental problems, itching, sneezing,  nasal congestion or discharge of excess mucus or purulent secretions, ear ache,   fever, chills, sweats, unintended wt loss or wt gain, classically pleuritic or exertional cp,  orthopnea pnd or arm/hand swelling  or leg swelling, presyncope, palpitations, abdominal pain, anorexia, nausea, vomiting, diarrhea  or change in bowel habits or change in bladder  habits, change in stools or change in urine, dysuria, hematuria,  rash, arthralgias, visual complaints, headache, numbness, weakness or ataxia or problems with walking or coordination,  change in mood or  memory.            Outpatient Medications Prior to Visit  Medication Sig Dispense Refill   albuterol  (VENTOLIN  HFA) 108 (90 Base) MCG/ACT inhaler Inhale 2 puffs into the lungs every 4 (four) hours as needed for wheezing or shortness of breath. 24 g 3   alendronate  (FOSAMAX ) 70 MG tablet Take 1 tablet (70 mg total) by mouth every 7 (seven) days. Take with a full glass of water  on an empty stomach. Do not lay down for at least 2 hours 4 tablet 11   aspirin  EC 81 MG tablet Take 81 mg by mouth every morning.      bisoprolol  (ZEBETA ) 5 MG tablet Take 0.5 tablets (2.5 mg total) by mouth daily. 45 tablet 1   cetirizine  (ZYRTEC  ALLERGY) 10 MG tablet Take 1 tablet (10 mg total) by mouth daily. For itching 30 tablet 5   clorazepate  (TRANXENE ) 7.5 MG tablet Take 1 tablet (7.5 mg total) by mouth 2 (two) times daily as needed for anxiety. 60 tablet 2   dicyclomine  (BENTYL ) 10 MG capsule Take 1 capsule (10 mg total) by mouth every 12 (twelve) hours as needed (abdominal pain). 180 capsule 1   fenofibrate  (TRICOR ) 48 MG tablet Take 1 tablet (48 mg  total) by mouth daily. 90 tablet 3   fluocinonide  cream (LIDEX ) 0.05 % Apply 1 Application topically 2 (two) times daily. 120 g 3   ipratropium-albuterol  (DUONEB) 0.5-2.5 (3) MG/3ML SOLN Take 3 mLs by nebulization 3 (three) times daily. 360 mL 0   meclizine  (ANTIVERT ) 25 MG tablet Take 1 tablet (25 mg total) by mouth 3 (three) times daily as needed for dizziness. 30 tablet 2   OXYGEN  Inhale 2 L into the lungs at bedtime. Can use in the morning as needed     pantoprazole  (PROTONIX ) 40 MG tablet Take 1 tablet (40 mg total) by mouth daily. 30 tablet 1   polyethylene glycol (MIRALAX  / GLYCOLAX ) 17 g packet Take 17 g by mouth daily as needed for mild constipation. 30 each  1   sacubitril -valsartan  (ENTRESTO ) 24-26 MG TAKE 1 TABLET BY MOUTH TWICE DAILY. 60 tablet 5   traMADol  (ULTRAM ) 50 MG tablet Take 1 tablet (50 mg total) by mouth 2 (two) times daily. 60 tablet 5   triamterene -hydrochlorothiazide (MAXZIDE-25) 37.5-25 MG tablet Take 1 tablet by mouth daily as needed (blood pressure and fluid).     Fluticasone -Umeclidin-Vilant (TRELEGY ELLIPTA ) 100-62.5-25 MCG/ACT AEPB Inhale 1 puff into the lungs daily in the afternoon. 180 each 1   No facility-administered medications prior to visit.    Past Medical History:  Diagnosis Date   Acute on chronic respiratory failure with hypoxia (HCC) 04/24/2018   Anxiety    Atypical chest pain    Bronchospasm    CAD (coronary artery disease)    a. s/p DES to mid-LAD and DES to mid-OM1 in 11/2016   Cervical disc disorder with myelopathy, unspecified cervical region    Cervicalgia 01/19/2009   Qualifier: Diagnosis of  By: Margrette MD, Stanley     Chest pain at rest 01/27/2015   Chronic systolic (congestive) heart failure (HCC)    COPD (chronic obstructive pulmonary disease) (HCC)    Coronary artery disease    De Quervain's disease (tenosynovitis) 04/02/2011   Disc disease with myelopathy, cervical    Essential hypertension    Hemorrhoids    Liver mass    Lung, cysts, congenital    Left lung cyst   Myocardial infarction (HCC)    Nephrolithiasis    Embedded   Nonischemic cardiomyopathy (HCC)    LVEF 35-40% 2015   On home O2    Osteoarthritis    Sprain of wrist 08/07/2012   Thoracic ascending aortic aneurysm (HCC)    4.3 cm April 2016    Objective:     BP 122/61   Pulse 73   Ht 5' 2 (1.575 m)   Wt 159 lb 3.2 oz (72.2 kg)   SpO2 96%   BMI 29.12 kg/m   SpO2: 96 % elderly wf walks with cane   HEENT : Oropharynx  clear / full dentures      Nasal turbinates nl    NECK :  without  apparent JVD/ palpable Nodes/TM    LUNGS: no acc muscle use,  Nl contour chest which is clear to A and P bilaterally without  cough on insp or exp maneuvers   CV:  RRR  no s3 or murmur or increase in P2, and no edema   ABD:  soft and nontender   MS:    ext warm without deformities Or obvious joint restrictions  calf tenderness, cyanosis or clubbing    SKIN: warm and dry without lesions    NEURO:  alert, approp, nl sensorium with  no motor or cerebellar deficits apparent.    I personally reviewed images and agree with radiology impression as follows:  CXR:   pa and lateral 09/19/23  Near complete interval resolution of mixed interstitial and airspace Disease My review: mild hyperinflation/ mild kyphosis      Assessment      Assessment & Plan Asthmatic bronchitis , chronic (HCC) Says quit smoking in her 30s (record says 2010)  -  PFTs  No obst 08/30/21  - 01/28/2024  After extensive coaching inhaler device,  effectiveness =    75% (short breath hold hfa)  - try off trelegy 01/28/2024  and just use approp saba  Presently even if she has significant copd / which I doubt, she is only Group A symptom/ risk so should do fine on prn saba with the following stipulations  Re SABA :  I spent extra time with pt today reviewing appropriate use of albuterol  for prn use on exertion with the following points: 1) saba is for relief of sob that does not improve by walking a slower pace or resting but rather if the pt does not improve after trying this first. 2) If the pt is convinced, as many are, that saba helps recover from activity faster then it's easy to tell if this is the case by re-challenging : ie stop, take the inhaler, then p 5 minutes try the exact same activity (intensity of workload) that just caused the symptoms and see if they are substantially diminished or not after saba 3) if there is an activity that reproducibly causes the symptoms, try the saba 15 min before the activity on alternate days   If in fact the saba really does help, then fine to continue to use it prn but advised may need to look closer at  the maintenance regimen being used (was trelegy, now 0) to achieve better control of airways disease with exertion.   Nocturnal hypoxemia On 02 2lpm hs per Vonzell - 01/28/2024   Walked on RA  x  2   lap(s) =  approx 300  ft  @ mod to fast pace, stopped due to tired with lowest 02 sats 96% and min sob    Doubt she really needs 02 at hs but would need to do ONO before d/c 02 as also has chf and want to keep  sats > 90% for 02 delivery / demand purposes / discussed          Each maintenance medication was reviewed in detail including emphasizing most importantly the difference between maintenance and prns and under what circumstances the prns are to be triggered using an action plan format where appropriate.  Total time for H and P, chart review, counseling, reviewing hfa/ dpi/ 02/ device(s) and generating customized AVS unique to this office visit / same day charting = 40 min with pt new to me            AVS  Patient Instructions  Ok to leave off Trelegy for now  - if breathing worse or start needing albuterol  more then restart trelegy 100 and call us  for refills  Continue 02 at 2lpm at bedtime  and call us  if any need to recertify    If you are satisfied with your treatment plan,  let your doctor know and he/she can either refill your medications or you can return here when your prescription runs out.     If in any way you are not 100% satisfied,  please  tell us .  If 100% better, tell your friends!  Pulmonary follow up is as needed       Ozell America, MD 01/28/2024

## 2024-01-28 NOTE — Patient Instructions (Addendum)
 Ok to leave off Trelegy for now  - if breathing worse or start needing albuterol  more then restart trelegy 100 and call us  for refills  Continue 02 at 2lpm at bedtime  and call us  if any need to recertify    If you are satisfied with your treatment plan,  let your doctor know and he/she can either refill your medications or you can return here when your prescription runs out.     If in any way you are not 100% satisfied,  please tell us .  If 100% better, tell your friends!  Pulmonary follow up is as needed

## 2024-01-28 NOTE — Assessment & Plan Note (Addendum)
 Says quit smoking in her 30s (record says 2010)  -  PFTs  No obst 08/30/21  - 01/28/2024  After extensive coaching inhaler device,  effectiveness =    75% (short breath hold hfa)  - try off trelegy 01/28/2024  and just use approp saba  Presently even if she has significant copd / which I doubt, she is only Group A symptom/ risk so should do fine on prn saba with the following stipulations  Re SABA :  I spent extra time with pt today reviewing appropriate use of albuterol  for prn use on exertion with the following points: 1) saba is for relief of sob that does not improve by walking a slower pace or resting but rather if the pt does not improve after trying this first. 2) If the pt is convinced, as many are, that saba helps recover from activity faster then it's easy to tell if this is the case by re-challenging : ie stop, take the inhaler, then p 5 minutes try the exact same activity (intensity of workload) that just caused the symptoms and see if they are substantially diminished or not after saba 3) if there is an activity that reproducibly causes the symptoms, try the saba 15 min before the activity on alternate days   If in fact the saba really does help, then fine to continue to use it prn but advised may need to look closer at the maintenance regimen being used (was trelegy, now 0) to achieve better control of airways disease with exertion.

## 2024-01-28 NOTE — Assessment & Plan Note (Addendum)
 On 02 2lpm hs per Vonzell - 01/28/2024   Walked on RA  x  2   lap(s) =  approx 300  ft  @ mod to fast pace, stopped due to tired with lowest 02 sats 96% and min sob    Doubt she really needs 02 at hs but would need to do ONO before d/c 02 as also has chf and want to keep  sats > 90% for 02 delivery / demand purposes / discussed          Each maintenance medication was reviewed in detail including emphasizing most importantly the difference between maintenance and prns and under what circumstances the prns are to be triggered using an action plan format where appropriate.  Total time for H and P, chart review, counseling, reviewing hfa/ dpi/ 02/ device(s) and generating customized AVS unique to this office visit / same day charting = 40 min with pt new to me

## 2024-02-05 DIAGNOSIS — H5711 Ocular pain, right eye: Secondary | ICD-10-CM | POA: Diagnosis not present

## 2024-02-05 DIAGNOSIS — R030 Elevated blood-pressure reading, without diagnosis of hypertension: Secondary | ICD-10-CM | POA: Diagnosis not present

## 2024-02-05 DIAGNOSIS — L02416 Cutaneous abscess of left lower limb: Secondary | ICD-10-CM | POA: Diagnosis not present

## 2024-02-10 ENCOUNTER — Encounter: Payer: Self-pay | Admitting: Adult Health

## 2024-02-10 ENCOUNTER — Ambulatory Visit: Admitting: Family Medicine

## 2024-02-10 ENCOUNTER — Ambulatory Visit (INDEPENDENT_AMBULATORY_CARE_PROVIDER_SITE_OTHER): Admitting: Adult Health

## 2024-02-10 VITALS — BP 131/67 | HR 71 | Ht 62.0 in | Wt 159.0 lb

## 2024-02-10 DIAGNOSIS — N611 Abscess of the breast and nipple: Secondary | ICD-10-CM | POA: Diagnosis not present

## 2024-02-10 DIAGNOSIS — L02224 Furuncle of groin: Secondary | ICD-10-CM

## 2024-02-10 DIAGNOSIS — H00033 Abscess of eyelid right eye, unspecified eyelid: Secondary | ICD-10-CM

## 2024-02-10 MED ORDER — SILVER SULFADIAZINE 1 % EX CREA: 1.0000 | TOPICAL_CREAM | Freq: Every day | CUTANEOUS | 0 refills | Status: DC

## 2024-02-10 MED ORDER — SULFAMETHOXAZOLE-TRIMETHOPRIM 800-160 MG PO TABS: 1.0000 | ORAL_TABLET | Freq: Two times a day (BID) | ORAL | 0 refills | Status: DC

## 2024-02-10 NOTE — Addendum Note (Signed)
 Addended by: NEYSA CLARITA RAMAN on: 02/10/2024 11:58 AM   Modules accepted: Orders

## 2024-02-10 NOTE — Progress Notes (Signed)
 Subjective:     Patient ID: [name], female   DOB: 10-Jul-1935, 88 y.o.   MRN: [id number]  HPI [name] is a 88 year old white female, widowed, sp hysterectomy in complaining of boil right breast, did had an odor, and has boil in right groin that is draining some now. She has an area right lower eyelid that is red and has opening, that she says she has used baby shampoo and hot compresses on for a week and it is better.  PCP is Dr Zollie   Review of Systems  +boil right breast, did had an odor + boil in right groin that is draining some now She has an area right lower eyelid that is red and has opening, that she says she has used baby shampoo and hot compresses on for a week and it is better. Reviewed past medical,surgical, social and family history. Reviewed medications and allergies.     Objective:   Physical Exam BP 131/67 (BP Location: Right Arm, Patient Position: Sitting, Cuff Size: Normal)   Pulse 71   Ht 5' 2 (1.575 m)   Wt 159 lb (72.1 kg)   BMI 29.08 kg/m    Skin is warm and dry, has red lower right eye lid with opening, no drainage Right breast has no masses, retraction or nipple discharge, has 1 cm red are with firm white center, culture taken In right groin has 2-3 cm boil draining blood fluid, culture taken, has swollen tender lymph node right groin Had Dr Jayne in for co exam.  Fall risk is low  Upstream - 02/10/24 1024       Pregnancy Intention Screening   Does the patient want to become pregnant in the next year? N/A    Does the patient's partner want to become pregnant in the next year? N/A    Would the patient like to discuss contraceptive options today? N/A      Contraception Wrap Up   Current Method Female Sterilization   hyst   End Method Female Sterilization   hyst   Contraception Counseling Provided No         Examination chaperoned by Clarita Salt LPN     Assessment:     1. Boil, breast (Primary) Has 1 cm area that is red and has white  center, at about 6-7  o'clock right breast, culture taken Dr Jayne in for co exam, looks like MRSA Will rx septra  ds 1 bid x 14 days Rx silvadene to apply to breast and groin, but do not put on eye Meds ordered this encounter  Medications   sulfamethoxazole -trimethoprim  (BACTRIM  DS) 800-160 MG tablet    Sig: Take 1 tablet by mouth 2 (two) times daily. Take 1 bid    Dispense:  28 tablet    Refill:  0    Supervising Provider:   JAYNE MINDER H [[id number]]   silver sulfADIAZINE (SILVADENE) 1 % cream    Sig: Apply 1 Application topically daily.    Dispense:  50 g    Refill:  0    Supervising Provider:   JAYNE MINDER H [[id number]]    2. Boil of groin Has 2-3 cm boil right groin, draining bloody fluid, culture taken Will rx septra  ds and silvadene Use warm compress   3. Boil of eyelid, right Has had for about a week, has not seen her PCP Red lower right eyelid with opening, no drainage  Continue warm compress    The  septra  ds should  cover this too. Plan:     Follow up in 2 weeks for recheck

## 2024-02-14 ENCOUNTER — Ambulatory Visit: Payer: Self-pay | Admitting: Adult Health

## 2024-02-14 LAB — WOUND CULTURE

## 2024-02-24 ENCOUNTER — Ambulatory Visit (INDEPENDENT_AMBULATORY_CARE_PROVIDER_SITE_OTHER): Admitting: Adult Health

## 2024-02-24 ENCOUNTER — Encounter: Payer: Self-pay | Admitting: Adult Health

## 2024-02-24 ENCOUNTER — Other Ambulatory Visit: Payer: Self-pay | Admitting: Adult Health

## 2024-02-24 ENCOUNTER — Encounter: Admitting: Internal Medicine

## 2024-02-24 VITALS — BP 126/61 | HR 76 | Ht 62.0 in | Wt 159.0 lb

## 2024-02-24 DIAGNOSIS — H00033 Abscess of eyelid right eye, unspecified eyelid: Secondary | ICD-10-CM

## 2024-02-24 DIAGNOSIS — L02224 Furuncle of groin: Secondary | ICD-10-CM | POA: Diagnosis not present

## 2024-02-24 DIAGNOSIS — R300 Dysuria: Secondary | ICD-10-CM

## 2024-02-24 DIAGNOSIS — R059 Cough, unspecified: Secondary | ICD-10-CM | POA: Diagnosis not present

## 2024-02-24 DIAGNOSIS — R350 Frequency of micturition: Secondary | ICD-10-CM | POA: Diagnosis not present

## 2024-02-24 DIAGNOSIS — N611 Abscess of the breast and nipple: Secondary | ICD-10-CM

## 2024-02-24 LAB — POCT URINALYSIS DIPSTICK
Blood, UA: NEGATIVE
Glucose, UA: NEGATIVE
Ketones, UA: NEGATIVE
Nitrite, UA: NEGATIVE
Protein, UA: NEGATIVE

## 2024-02-24 NOTE — Progress Notes (Signed)
  Subjective:     Patient ID: [name], female   DOB: 22-Oct-1935, 88 y.o.   MRN: [id number]  HPI [name] is a 88 year old white female, widowed, sp hysterectomy back in follow up on having 3 boils and they have resolved. She is having some burning with urination and frequency. She also has had a cough and some shortness of breath, she wonders if bronchitis, she has O2 she uses at night.  PCP is Dr Zollie  Review of Systems Boils are gone +burning with urination and some frequency  + cough and some shortness of breath, she wonders if bronchitis, she has O2 she uses at night Reviewed past medical,surgical, social and family history. Reviewed medications and allergies.     Objective:   Physical Exam BP 126/61 (BP Location: Right Arm, Patient Position: Sitting, Cuff Size: Normal)   Pulse 76   Ht 5' 2 (1.575 m)   Wt 159 lb (72.1 kg)   SpO2 92%   BMI 29.08 kg/m  urine dipstick 1+leuks   Skin warm and dry. Lungs: clear to ausculation bilaterally. Cardiovascular: regular rate and rhythm. Boil right eye, right breast and right groin has resolved.  Upstream - 02/24/24 1542       Pregnancy Intention Screening   Does the patient want to become pregnant in the next year? N/A    Does the patient's partner want to become pregnant in the next year? N/A    Would the patient like to discuss contraceptive options today? N/A      Contraception Wrap Up   Current Method Female Sterilization   hyst   End Method Female Sterilization   hyst   Contraception Counseling Provided No          Assessment:    1. Burning with urination +burning with urination and frequency UA C&S sent to rule out UTI  - Urine Culture - Urinalysis, Routine w reflex microscopic - POCT Urinalysis Dipstick  2. Urinary frequency UA C&S sent  - Urine Culture - Urinalysis, Routine w reflex microscopic - POCT Urinalysis Dipstick  3. Boil, breast Has resolved   4. Boil of groin (Primary) Has resolved   5. Boil  of eyelid, right Has resolved   6. Cough, unspecified type Has cough and some shortness of breath at times, she wonders if bronchitis, to call PCP Has O2 she uses at night     Plan:     Follow up prn

## 2024-02-25 ENCOUNTER — Encounter: Payer: Self-pay | Admitting: Family Medicine

## 2024-02-25 ENCOUNTER — Ambulatory Visit: Admitting: Family Medicine

## 2024-02-25 VITALS — BP 127/63 | HR 66 | Temp 98.2°F | Ht 62.0 in | Wt 159.0 lb

## 2024-02-25 DIAGNOSIS — N3090 Cystitis, unspecified without hematuria: Secondary | ICD-10-CM

## 2024-02-25 LAB — URINALYSIS, ROUTINE W REFLEX MICROSCOPIC
Bilirubin, UA: NEGATIVE
Glucose, UA: NEGATIVE
Ketones, UA: NEGATIVE
Nitrite, UA: NEGATIVE
Protein,UA: NEGATIVE
RBC, UA: NEGATIVE
Specific Gravity, UA: 1.016 (ref 1.005–1.030)
Urobilinogen, Ur: 1 mg/dL (ref 0.2–1.0)
pH, UA: 6.5 (ref 5.0–7.5)

## 2024-02-25 LAB — MICROSCOPIC EXAMINATION
Casts: NONE SEEN /LPF
RBC, Urine: NONE SEEN /HPF (ref 0–2)

## 2024-02-25 MED ORDER — CIPROFLOXACIN HCL 250 MG PO TABS: 250.0000 mg | ORAL_TABLET | Freq: Two times a day (BID) | ORAL | 0 refills | Status: DC

## 2024-02-25 NOTE — Progress Notes (Signed)
 Subjective:  Patient ID: [name], female    DOB: 28-Jul-1935  Age: 88 y.o. MRN: [id number]  CC: Urinary Tract Infection   HPI  Discussed the use of AI scribe software for clinical note transcription with the patient, who gave verbal consent to proceed.  History of Present Illness [name] is an 88 year old female who presents with urinary symptoms.  She has been experiencing dysuria, suprapubic soreness, and increased urinary frequency and urgency for over two weeks. She often has to rush to the bathroom and has experienced incontinence on a few occasions.  She has a history of MRSA infections, with lesions previously located on her right eye, under her right breast, and inside her leg. Last month, she had a boil on the bottom of her stomach that was lanced. She is currently completing a course of antibiotics, with 28 tablets prescribed, and is expected to finish them by tomorrow.  She describes feeling unwell, akin to having the flu, and has experienced voice loss. She reports itching in her hand and questions if the medication she received for her hand is effective for itching.          02/25/2024    2:42 PM 09/11/2023    2:28 PM 08/08/2023   10:27 AM  Depression screen PHQ 2/9  Decreased Interest 0 0 1  Down, Depressed, Hopeless 1 1 1   PHQ - 2 Score 1 1 2   Altered sleeping 3  1  Tired, decreased energy 0  2  Change in appetite 2  1  Feeling bad or failure about yourself  1  0  Trouble concentrating 1  3  Moving slowly or fidgety/restless 1  2  Suicidal thoughts 0  0  PHQ-9 Score 9  11  Difficult doing work/chores Not difficult at all  Somewhat difficult    History Nidya has a past medical history of Acute on chronic respiratory failure with hypoxia (HCC) (04/24/2018), Anxiety, Atypical chest pain, Bronchospasm, CAD (coronary artery disease), Cervical disc disorder with myelopathy, unspecified cervical region, Cervicalgia (01/19/2009), Chest pain at rest  (01/27/2015), Chronic systolic (congestive) heart failure (HCC), COPD (chronic obstructive pulmonary disease) (HCC), Coronary artery disease, De Quervain's disease (tenosynovitis) (04/02/2011), Disc disease with myelopathy, cervical, Essential hypertension, Hemorrhoids, Liver mass, Lung, cysts, congenital, Myocardial infarction (HCC), Nephrolithiasis, Nonischemic cardiomyopathy (HCC), On home O2, Osteoarthritis, Sprain of wrist (08/07/2012), and Thoracic ascending aortic aneurysm.   She has a past surgical history that includes Tonsillectomy and adenoidectomy; Complete hysterectomy; Benign breast tumors; Cholecystectomy; Colonoscopy; Colonoscopy (N/A, 09/22/2014); LEFT HEART CATH AND CORONARY ANGIOGRAPHY (N/A, 11/23/2016); CORONARY STENT INTERVENTION (N/A, 11/23/2016); Colonoscopy with propofol  (N/A, 04/16/2023); Polypectomy (04/16/2023); IR Radiologist Eval & Mgmt (09/18/2023); IR INTRAVASCULAR ULTRASOUND NON CORONARY (10/02/2023); IR Angiogram Visceral Selective (10/02/2023); IR US  Guide Vasc Access Right (10/02/2023); IR Angiogram Visceral Selective (10/02/2023); IR Angiogram Selective Each Additional Vessel (10/02/2023); and IR Angiogram Selective Each Additional Vessel (10/02/2023).   Her family history includes Aneurysm in her father; Diabetes in her brother; Heart disease in her brother, mother, and sister; Lung cancer in her brother.She reports that she quit smoking about 15 years ago. Her smoking use included cigarettes. She started smoking about 46 years ago. She has a 7.8 pack-year smoking history. She has never used smokeless tobacco. She reports that she does not drink alcohol and does not use drugs.    ROS Review of Systems  Constitutional:  Negative for chills, diaphoresis and fever.  HENT:  Negative for congestion.   Eyes:  Negative for visual disturbance.  Respiratory:  Negative for cough and shortness of breath.   Cardiovascular:  Negative for chest pain and palpitations.  Gastrointestinal:   Negative for constipation, diarrhea and nausea.  Genitourinary:  Positive for dysuria, frequency and urgency. Negative for decreased urine volume, flank pain, hematuria, menstrual problem and pelvic pain.  Musculoskeletal:  Negative for arthralgias and joint swelling.  Skin:  Negative for rash.  Neurological:  Negative for dizziness and numbness.    Objective:  BP 127/63   Pulse 66   Temp 98.2 F (36.8 C)   Ht 5' 2 (1.575 m)   Wt 159 lb (72.1 kg)   SpO2 94%   BMI 29.08 kg/m   BP Readings from Last 3 Encounters:  02/25/24 127/63  02/24/24 126/61  02/10/24 131/67    Wt Readings from Last 3 Encounters:  02/25/24 159 lb (72.1 kg)  02/24/24 159 lb (72.1 kg)  02/10/24 159 lb (72.1 kg)     Physical Exam Physical Exam GENERAL: Alert, cooperative, well developed, no acute distress. HEENT: Normocephalic, normal oropharynx, moist mucous membranes. CHEST: Clear to auscultation bilaterally, no wheezes, rhonchi, or crackles. CARDIOVASCULAR: Normal heart rate and rhythm, S1 and S2 normal without murmurs. ABDOMEN: Soft, non-tender, non-distended, without organomegaly, normal bowel sounds. EXTREMITIES: No cyanosis or edema. NEUROLOGICAL: Cranial nerves grossly intact, moves all extremities without gross motor or sensory deficit.   Assessment & Plan:  Cystitis  Other orders -     Ciprofloxacin  HCl; Take 1 tablet (250 mg total) by mouth 2 (two) times daily.  Dispense: 10 tablet; Refill: 0    Assessment and Plan Assessment & Plan Urinary tract infection   She presents with symptoms of dysuria, increased frequency, urgency, and lower abdominal pain, consistent with a urinary tract infection. Symptoms have persisted since September, indicating a subacute course. Urinalysis confirmed infection with pyuria and bacteriuria. Culture results are pending to guide further antibiotic management. Prescribe antibiotics twice daily for five days. Adjust antibiotics based on culture results if  necessary.       Follow-up: No follow-ups on file.  Butler Der, M.D.

## 2024-02-26 ENCOUNTER — Encounter (INDEPENDENT_AMBULATORY_CARE_PROVIDER_SITE_OTHER): Payer: Self-pay | Admitting: Gastroenterology

## 2024-02-27 LAB — URINE CULTURE

## 2024-02-28 ENCOUNTER — Ambulatory Visit: Payer: Self-pay | Admitting: Adult Health

## 2024-03-10 ENCOUNTER — Other Ambulatory Visit: Payer: Self-pay | Admitting: Family Medicine

## 2024-03-22 NOTE — Progress Notes (Deleted)
 Cardiology Office Note    Date:  03/22/2024  ID:  [name], DOB [birth year], MRN [id number] Cardiologist: Maude Emmer, MD    History of Present Illness:    [name] is a 88 y.o. female with past medical history of CAD (s/p DES to mid-LAD and DES to mid-OM1 in 11/2016, mild peri-infarct ischemia by Myovue in 07/2021 and medical management pursued), HFrEF (EF 40-45% in 2018, at 45% by repeat echo in 12/2018 and 35-40% in 05/2021 and 01/2022), HTN, HLD, RA, dilation of ascending aorta (at 4.2 cm by imaging in 2019 and 4.3 cm in 01/2022) and COPD who presents to the office today for  follow-up.  Bradycardia Coreg  dose reduced Her monitor  09/02/22 showed predominantly normal sinus rhythm with one episode of NSVT for 16 beats. Continued medical therapy recommended . She was also noted to have brief SVT with the longest being 20 beats but no significant bradycardia.  She has been under increased stress as she did have a family member staying with her for a significant period of time. She has not been taking her statin regularly as she experienced cramps with this every time she would initiate the medication.   Diverticulitis Rx with Augmentin  not helpful and changed to Cipro  and Metronidazole  by primary 02/12/23 Had abdominal pain but CTA with no evidence of mesenteric ischemia.   To see GI and likely needs EGD  Studies Reviewed:   EKG: EKG is not ordered today.  Echocardiogram: 01/2022 IMPRESSIONS     1. Left ventricular ejection fraction, by estimation, is 35 to 40%. The  left ventricle has moderately decreased function. There is global  hypokinesis that appears slightly worse in the basal-to-mid inferior and  basal-to-mid inferoseptal segments. There  is mild concentric left ventricular hypertrophy. Indeterminate diastolic  filling due to E-A fusion. There is septal-lateral dyssynchrony in the  setting of a LBBB.   2. Right ventricular systolic function is normal. The right  ventricular  size is normal. There is normal pulmonary artery systolic pressure. The  estimated right ventricular systolic pressure is 20.8 mmHg.   3. Moderate pericardial effusion. The pericardial effusion is localized  near the right ventricle and measures 1.6cm at max diameter. There is no  evidence of cardiac tamponade. Compared to prior TTE, the effusion appears  slightly larger on current study  but still within moderate range. Recommend serial monitoring with TTEs.   4. The mitral valve is normal in structure. Trivial mitral valve  regurgitation.   5. The aortic valve was not well visualized. There is mild calcification  of the aortic valve. There is mild thickening of the aortic valve. Aortic  valve regurgitation is trivial. Aortic valve sclerosis/calcification is  present, without any evidence of  aortic stenosis. Aortic valve mean gradient measures 5.0 mmHg.   6. The inferior vena cava is normal in size with greater than 50%  respiratory variability, suggesting right atrial pressure of 3 mmHg.   7. Left atrial size was mildly dilated.   Comparison(s): Compared to prior TTE, there is no significant change. EF  remains stable at 35-40%. There continues to be a moderate pericardial  effusion that is slightly larger on current study. Recommend continued  monitoring as outpatient.    Event Monitor: 08/2022 Patch Wear Time:  13 days and 23 hours (2024-03-20T14:38:42-0400 to 2024-04-03T14:38:34-0400)   Patient had a min HR of 49 bpm, max HR of 197 bpm, and avg HR of 75 bpm. Predominant underlying rhythm was Sinus  Rhythm. Bundle Branch Block/IVCD was present. 11 Ventricular Tachycardia runs occurred, the run with the fastest interval lasting 16 beats  with a max rate of 197 bpm (avg 153 bpm); the run with the fastest interval was also the longest. 8 Supraventricular Tachycardia runs occurred, the run with the fastest interval lasting 7 beats with a max rate of 160 bpm, the longest  lasting 20 beats  with an avg rate of 126 bpm. Isolated SVEs were rare (<1.0%), SVE Couplets were rare (<1.0%), and SVE Triplets were rare (<1.0%). Isolated VEs were occasional (3.6%, 53407), VE Couplets were rare (<1.0%, 3087), and VE Triplets were rare (<1.0%, 497).  Ventricular Bigeminy and Trigeminy were present.  Physical Exam:   VS:  There were no vitals taken for this visit.   Wt Readings from Last 3 Encounters:  02/25/24 159 lb (72.1 kg)  02/24/24 159 lb (72.1 kg)  02/10/24 159 lb (72.1 kg)     Affect appropriate Healthy:  appears stated age HEENT: normal Neck supple with no adenopathy JVP normal no bruits no thyromegaly Lungs clear with no wheezing and good diaphragmatic motion Heart:  S1/S2 no murmur, no rub, gallop or click PMI normal Abdomen: benighn, BS positve, no tenderness, no AAA no bruit.  No HSM or HJR Prior hysterectomy and cholecystectomy Distal pulses intact with no bruits No edema Neuro non-focal Skin warm and dry No muscular weakness    Assessment and Plan:   1. CAD - She is s/p DES to mid-LAD and DES to mid-OM1 in 11/2016 with Myovue in 07/2021 showing mild peri-infarct ischemia and medical management pursued.  - Stable continue ASA, Imdur  , coreg  and crestor   2. HFrEF - Echocardiogram in 01/2022 showed her EF was at 35 to 40% and she has been continued on Coreg , Entresto  and Imdur . She has not been on an SGLT2 inhibitor given her history of frequent UTI's.  - She appears euvolemic she defers aldactone   - Will continue Coreg  and Entresto  at current dosing Imdur  increased on visit  08/01/22  - Update TTE  3. Dilation of Ascending Aorta - This measured 4.3 cm by imaging in 01/2022. Reviewed with the patient today  - Only measrued 3.8 cm on CTA 08/2023 ? Prior measurement oblique  4. History of Presyncope - Prior monitor as discussed above showed episodes of VT with the longest lasting for 16 beats and she did have PVC's with a 3.6% burden. Findings  were reviewed  recommended medical therapy at that time. She denies any recurrent presyncopal episodes. Remains on Coreg  3.125 mg twice daily given intermittent palpitations (had bradycardia with higher dosing).   5. HLD - Her LDL was significantly elevated at 134 Intolerant to lipitor  Now on crestor  update labs  6. Diverticulitis:  mild sigmoid on CT 02/03/23 Rx multiple rounds antibiotics Beware of C diff. F/U GI consider EGD  7. RA:  multiple sites including knees Continue bid Tramadol   Lipid/Liver Update echo for EF and DCM  F/U in a year   Signed, Maude Emmer, MD

## 2024-03-25 ENCOUNTER — Ambulatory Visit: Admitting: Cardiovascular Disease

## 2024-04-05 ENCOUNTER — Other Ambulatory Visit: Payer: Self-pay | Admitting: Family Medicine

## 2024-04-05 DIAGNOSIS — F419 Anxiety disorder, unspecified: Secondary | ICD-10-CM

## 2024-04-21 ENCOUNTER — Ambulatory Visit: Payer: Self-pay | Admitting: Family Medicine

## 2024-04-21 ENCOUNTER — Encounter: Payer: Self-pay | Admitting: Family Medicine

## 2024-04-21 ENCOUNTER — Ambulatory Visit: Payer: Self-pay

## 2024-04-21 ENCOUNTER — Ambulatory Visit: Admitting: Family Medicine

## 2024-04-21 VITALS — BP 120/62 | HR 96 | Temp 97.1°F | Ht 62.0 in | Wt 153.0 lb

## 2024-04-21 DIAGNOSIS — F419 Anxiety disorder, unspecified: Secondary | ICD-10-CM

## 2024-04-21 DIAGNOSIS — N3000 Acute cystitis without hematuria: Secondary | ICD-10-CM

## 2024-04-21 DIAGNOSIS — I110 Hypertensive heart disease with heart failure: Secondary | ICD-10-CM

## 2024-04-21 DIAGNOSIS — M150 Primary generalized (osteo)arthritis: Secondary | ICD-10-CM

## 2024-04-21 DIAGNOSIS — J4489 Other specified chronic obstructive pulmonary disease: Secondary | ICD-10-CM

## 2024-04-21 LAB — URINALYSIS, ROUTINE W REFLEX MICROSCOPIC
Bilirubin, UA: NEGATIVE
Glucose, UA: NEGATIVE
Nitrite, UA: NEGATIVE
Specific Gravity, UA: 1.01 (ref 1.005–1.030)
Urobilinogen, Ur: 0.2 mg/dL (ref 0.2–1.0)
pH, UA: 5.5 (ref 5.0–7.5)

## 2024-04-21 LAB — MICROSCOPIC EXAMINATION
Epithelial Cells (non renal): NONE SEEN /HPF (ref 0–10)
Renal Epithel, UA: NONE SEEN /HPF
WBC, UA: >30 /HPF — AB (ref 0–5)

## 2024-04-21 MED ORDER — CLORAZEPATE DIPOTASSIUM 7.5 MG PO TABS: 7.5000 mg | ORAL_TABLET | Freq: Two times a day (BID) | ORAL | 2 refills | Status: AC | PRN

## 2024-04-21 MED ORDER — BISOPROLOL FUMARATE 5 MG PO TABS: 2.5000 mg | ORAL_TABLET | Freq: Every day | ORAL | 1 refills | Status: AC

## 2024-04-21 MED ORDER — CELECOXIB 200 MG PO CAPS: 200.0000 mg | ORAL_CAPSULE | Freq: Every day | ORAL | 5 refills | Status: AC

## 2024-04-21 MED ORDER — SULFAMETHOXAZOLE-TRIMETHOPRIM 800-160 MG PO TABS: 1.0000 | ORAL_TABLET | Freq: Two times a day (BID) | ORAL | 0 refills | Status: DC

## 2024-04-21 MED ORDER — SACUBITRIL-VALSARTAN 24-26 MG PO TABS: 1.0000 | ORAL_TABLET | Freq: Two times a day (BID) | ORAL | 1 refills | Status: AC

## 2024-04-21 NOTE — Progress Notes (Addendum)
 Subjective:  Patient ID: [name], female    DOB: [birth year]  Age: 88 y.o. MRN: [id number]  CC: Urinary Tract Infection (Sympotms never fulling went away. Burning and pain with urination. Feels weak and overall not well. Mind isn't right. )   HPI  Discussed the use of AI scribe software for clinical note transcription with the patient, who gave verbal consent to proceed.  History of Present Illness [name] is an 88 year old female who presents with urinary incontinence and dysuria.  She experiences urinary incontinence characterized by an inability to hold urine, which results in significant leakage despite wearing protection. This necessitates the use of a trash can during episodes. Additionally, she reports a burning sensation during urination.  She has chronic stomach pain that exacerbates with certain movements. She manages her symptoms by avoiding specific foods, such as whole corn. She does not use daily medication for bowel movements but takes it as needed, particularly when anticipating constipation.  Her medical history includes arthritis, primarily affecting her fingers, with occasional pain and itching. She recalls having 'cell tumors' removed from her hand and leg, which were associated with arthritis. She avoids Tylenol  due to past adverse reactions.  She has a new pulmonologist and was advised to discontinue a spray that caused chest pain. She continues to use an inhaler and oxygen , especially during emotional distress or physical exertion, such as climbing stairs.  Her current medications include chlorazepate twice daily for anxiety and tramadol  for pain, which she uses sparingly. She also takes a weekly medication for bone health, likely for osteoporosis.  She is concerned about her sister's past urinary tract infection that resulted in a severe outcome, indicating a family history of serious infections.  She has taken in her granddaughter and her two young  children, which has been a source of stress.          04/21/2024    1:54 PM 02/25/2024    2:42 PM 09/11/2023    2:28 PM  Depression screen PHQ 2/9  Decreased Interest 2 0 0  Down, Depressed, Hopeless 2 1 1   PHQ - 2 Score 4 1 1   Altered sleeping 2 3   Tired, decreased energy 2 0   Change in appetite 3 2   Feeling bad or failure about yourself  3 1   Trouble concentrating 0 1   Moving slowly or fidgety/restless 3 1   Suicidal thoughts 1 0   PHQ-9 Score 18 9    Difficult doing work/chores Very difficult Not difficult at all      Data saved with a previous flowsheet row definition    History [name] has a past medical history of Acute on chronic respiratory failure with hypoxia (HCC) (04/24/2018), Anxiety, Atypical chest pain, Bronchospasm, CAD (coronary artery disease), Cervical disc disorder with myelopathy, unspecified cervical region, Cervicalgia (01/19/2009), Chest pain at rest (01/27/2015), Chronic systolic (congestive) heart failure (HCC), COPD (chronic obstructive pulmonary disease) (HCC), Coronary artery disease, De Quervain's disease (tenosynovitis) (04/02/2011), Disc disease with myelopathy, cervical, Essential hypertension, Hemorrhoids, Liver mass, Lung, cysts, congenital, Myocardial infarction (HCC), Nephrolithiasis, Nonischemic cardiomyopathy (HCC), On home O2, Osteoarthritis, Sprain of wrist (08/07/2012), and Thoracic ascending aortic aneurysm.   She has a past surgical history that includes Tonsillectomy and adenoidectomy; Complete hysterectomy; Benign breast tumors; Cholecystectomy; Colonoscopy; Colonoscopy (N/A, 09/22/2014); LEFT HEART CATH AND CORONARY ANGIOGRAPHY (N/A, 11/23/2016); CORONARY STENT INTERVENTION (N/A, 11/23/2016); Colonoscopy with propofol  (N/A, 04/16/2023); Polypectomy (04/16/2023); IR Radiologist Eval & Mgmt (09/18/2023); IR INTRAVASCULAR  ULTRASOUND NON CORONARY (10/02/2023); IR Angiogram Visceral Selective (10/02/2023); IR US  Guide Vasc Access Right (10/02/2023); IR  Angiogram Visceral Selective (10/02/2023); IR Angiogram Selective Each Additional Vessel (10/02/2023); and IR Angiogram Selective Each Additional Vessel (10/02/2023).   Her family history includes Aneurysm in her father; Diabetes in her brother; Heart disease in her brother, mother, and sister; Lung cancer in her brother.She reports that she quit smoking about 15 years ago. Her smoking use included cigarettes. She started smoking about 46 years ago. She has a 7.8 pack-year smoking history. She has never used smokeless tobacco. She reports that she does not drink alcohol and does not use drugs.    ROS Review of Systems  Constitutional: Negative.   HENT:  Negative for congestion.   Eyes:  Negative for visual disturbance.  Respiratory:  Negative for shortness of breath.   Cardiovascular:  Negative for chest pain.  Gastrointestinal:  Negative for abdominal pain, constipation, diarrhea, nausea and vomiting.  Genitourinary:  Positive for dysuria. Negative for difficulty urinating.  Musculoskeletal:  Negative for arthralgias and myalgias.  Neurological:  Negative for headaches.  Psychiatric/Behavioral:  Negative for sleep disturbance.     Objective:  BP 120/62   Pulse 96   Temp (!) 97.1 F (36.2 C)   Ht 5' 2 (1.575 m)   Wt 153 lb (69.4 kg)   SpO2 100%   BMI 27.98 kg/m   BP Readings from Last 3 Encounters:  04/21/24 120/62  02/25/24 127/63  02/24/24 126/61    Wt Readings from Last 3 Encounters:  04/21/24 153 lb (69.4 kg)  02/25/24 159 lb (72.1 kg)  02/24/24 159 lb (72.1 kg)     Physical Exam Constitutional:      General: She is not in acute distress.    Appearance: She is well-developed.  Cardiovascular:     Rate and Rhythm: Normal rate and regular rhythm.  Pulmonary:     Breath sounds: Normal breath sounds.  Musculoskeletal:        General: Normal range of motion.  Skin:    General: Skin is warm and dry.  Neurological:     Mental Status: She is alert and oriented to  person, place, and time.    Physical Exam GENERAL: Alert, cooperative, well developed, no acute distress HEENT: Normocephalic, normal oropharynx, moist mucous membranes CHEST: Clear to auscultation bilaterally, No wheezes, rhonchi, or crackles CARDIOVASCULAR: Normal heart rate and rhythm, S1 and S2 normal without murmurs ABDOMEN: Soft, non-tender, non-distended, without organomegaly, Normal bowel sounds EXTREMITIES: No cyanosis or edema NEUROLOGICAL: Cranial nerves grossly intact, Moves all extremities without gross motor or sensory deficit   Assessment & Plan:  Asthmatic bronchitis , chronic (HCC) -     CBC with Differential/Platelet -     CMP14+EGFR  Acute cystitis without hematuria -     Urinalysis, Routine w reflex microscopic -     Urine Culture -     Sulfamethoxazole -Trimethoprim ; Take 1 tablet by mouth 2 (two) times daily.  Dispense: 14 tablet; Refill: 0 -     Microscopic Examination  Primary osteoarthritis involving multiple joints -     CBC with Differential/Platelet -     CMP14+EGFR -     Celecoxib ; Take 1 capsule (200 mg total) by mouth daily. With food. For arthritis pain  Dispense: 30 capsule; Refill: 5  Anxiety -     Clorazepate  Dipotassium; Take 1 tablet (7.5 mg total) by mouth 2 (two) times daily as needed for anxiety.  Dispense: 60 tablet; Refill: 2  Hypertensive heart disease with congestive heart failure, unspecified heart failure type (HCC) -     CMP14+EGFR -     Bisoprolol  Fumarate; Take 0.5 tablets (2.5 mg total) by mouth daily.  Dispense: 45 tablet; Refill: 1 -     Sacubitril -Valsartan ; Take 1 tablet by mouth 2 (two) times daily.  Dispense: 180 tablet; Refill: 1    Assessment and Plan Assessment & Plan Acute cystitis   Urinary tract infection confirmed by urinalysis with symptoms of urinary incontinence, burning, and pain. She has a history of severe infections with complications. Prescribed an antibiotic for the infection with a refill for potential  travel issues.  Primary generalized osteoarthritis   Arthritis pain affects her fingers and other areas. Previous medication was discontinued due to kidney concerns. Currently, she manages pain with tramadol  as needed. Celebrex  is prescribed for arthritis pain management.  Chronic obstructive pulmonary disease   COPD is well-managed with stable lung function. She reports chest pain with inhaler use and uses oxygen  at night and occasionally during exertion. Nebulizer treatments are discontinued as breathing is stable. Continue oxygen  use as needed.  Anxiety disorder   Anxiety is managed with chlorazepate. Recent family stressors have increased anxiety symptoms. Chlorazepate prescription is renewed for continued management.       Follow-up: Return in about 3 months (around 07/20/2024).  Butler Der, M.D.

## 2024-04-21 NOTE — Telephone Encounter (Signed)
 FYI Only or Action Required?: FYI only for provider: appointment scheduled on This afternoon - could not come in this morning d/t road conditions.  Patient was last seen in primary care on 02/25/2024 by Zollie Lowers, MD.  Called Nurse Triage reporting Dysuria.  Symptoms began several months ago.  Interventions attempted: Prescription medications: Seen in office.  Symptoms are: gradually worsening.  Triage Disposition: See Physician Within 24 Hours  Patient/caregiver understands and will follow disposition?: Yes                       Copied from CRM #[id number]. Topic: Clinical - Red Word Triage >> Apr 21, 2024  8:55 AM Wess RAMAN wrote: Red Word that prompted transfer to Nurse Triage: Bladder infection that has gotten worse, frequent urination and pain. Would like a later appt for today. Reason for Disposition  More than 2 UTI's in last year  Answer Assessment - Initial Assessment Questions 1. SEVERITY: How bad is the pain?  (e.g., Scale 1-10; mild, moderate, or severe)     moderate 2. FREQUENCY: How many times have you had painful urination today?      unknown 3. PATTERN: Is pain present every time you urinate or just sometimes?      Every time 4. ONSET: When did the painful urination start?      Several weeks ago - got better  - now worsening 5. FEVER: Do you have a fever? If Yes, ask: What is your temperature, how was it measured, and when did it start?     no 6. PAST UTI: Have you had a urine infection before? If Yes, ask: When was the last time? and What happened that time?      yes 7. CAUSE: What do you think is causing the painful urination?  (e.g., UTI, scratch, Herpes sore)     UTI 8. OTHER SYMPTOMS: Do you have any other symptoms? (e.g., blood in urine, flank pain, genital sores, urgency, vaginal discharge)     Abdominal pain ongoing for years  Protocols used: Urination Pain - Female-A-AH

## 2024-04-22 ENCOUNTER — Other Ambulatory Visit: Payer: Self-pay | Admitting: Family Medicine

## 2024-04-22 ENCOUNTER — Telehealth: Payer: Self-pay | Admitting: Family Medicine

## 2024-04-22 LAB — CBC WITH DIFFERENTIAL/PLATELET
Basophils Absolute: 0.1 x10E3/uL (ref 0.0–0.2)
Basos: 1 %
EOS (ABSOLUTE): 0.3 x10E3/uL (ref 0.0–0.4)
Eos: 3 %
Hematocrit: 40.8 % (ref 34.0–46.6)
Hemoglobin: 13 g/dL (ref 11.1–15.9)
Immature Grans (Abs): 0 x10E3/uL (ref 0.0–0.1)
Immature Granulocytes: 0 %
Lymphocytes Absolute: 1.9 x10E3/uL (ref 0.7–3.1)
Lymphs: 22 %
MCH: 29.3 pg (ref 26.6–33.0)
MCHC: 31.9 g/dL (ref 31.5–35.7)
MCV: 92 fL (ref 79–97)
Monocytes Absolute: 1 x10E3/uL — ABNORMAL HIGH (ref 0.1–0.9)
Monocytes: 11 %
Neutrophils Absolute: 5.2 x10E3/uL (ref 1.4–7.0)
Neutrophils: 63 %
Platelets: 294 x10E3/uL (ref 150–450)
RBC: 4.43 x10E6/uL (ref 3.77–5.28)
RDW: 14.3 % (ref 11.7–15.4)
WBC: 8.3 x10E3/uL (ref 3.4–10.8)

## 2024-04-22 LAB — CMP14+EGFR
ALT: 8 IU/L (ref 0–32)
AST: 23 IU/L (ref 0–40)
Albumin: 4.6 g/dL (ref 3.7–4.7)
Alkaline Phosphatase: 51 IU/L (ref 48–129)
BUN/Creatinine Ratio: 17 (ref 12–28)
BUN: 13 mg/dL (ref 8–27)
Bilirubin Total: 0.6 mg/dL (ref 0.0–1.2)
CO2: 24 mmol/L (ref 20–29)
Calcium: 10.9 mg/dL — AB (ref 8.7–10.3)
Chloride: 104 mmol/L (ref 96–106)
Creatinine, Ser: 0.78 mg/dL (ref 0.57–1.00)
Globulin, Total: 2.1 g/dL (ref 1.5–4.5)
Glucose: 109 mg/dL — AB (ref 70–99)
Potassium: 5.1 mmol/L (ref 3.5–5.2)
Sodium: 146 mmol/L — AB (ref 134–144)
Total Protein: 6.7 g/dL (ref 6.0–8.5)
eGFR: 74 mL/min/1.73 (ref >59)

## 2024-04-22 MED ORDER — LINACLOTIDE 145 MCG PO CAPS: 145.0000 ug | ORAL_CAPSULE | Freq: Every day | ORAL | 5 refills | Status: AC

## 2024-04-22 NOTE — Telephone Encounter (Signed)
 Please let the patient know that I sent their prescription to their pharmacy. Thanks, WS

## 2024-04-22 NOTE — Telephone Encounter (Signed)
 FYI for PCP  Copied from CRM [id number]. Topic: Clinical - Medication Question >> Apr 22, 2024 10:29 AM Graeme ORN wrote: Reason for CRM: Patient called. States Dr Zollie told her to call back with name of medication he prescribe for her to help regulate bowels. Patient provided medication name: Linzess . States she has been on it for a year. Also wanted to let him know she spoke with Pulmonology and they are supposed to fax report. Thank You

## 2024-04-22 NOTE — Telephone Encounter (Signed)
Pt notified.    LS

## 2024-04-22 NOTE — Progress Notes (Signed)
Hello Tishanna,  Your lab result is normal and/or stable.Some minor variations that are not significant are commonly marked abnormal, but do not represent any medical problem for you.  Best regards, Gracie Gupta, M.D.

## 2024-04-25 LAB — URINE CULTURE

## 2024-04-28 ENCOUNTER — Other Ambulatory Visit: Payer: Self-pay | Admitting: Family Medicine

## 2024-04-28 DIAGNOSIS — N3000 Acute cystitis without hematuria: Secondary | ICD-10-CM

## 2024-04-28 MED ORDER — SULFAMETHOXAZOLE-TRIMETHOPRIM 800-160 MG PO TABS: 1.0000 | ORAL_TABLET | Freq: Two times a day (BID) | ORAL | 0 refills | Status: AC

## 2024-05-28 ENCOUNTER — Ambulatory Visit: Attending: Student | Admitting: Student

## 2024-05-28 NOTE — Progress Notes (Unsigned)
 "  Cardiology Office Note    Date:  05/28/2024  ID:  [name], DOB 10-19-1935, MRN [id number] Cardiologist: Maude Emmer, MD { :  History of Present Illness:    [name] is a 89 y.o. female with past medical history of CAD (s/p DES to mid-LAD and DES to mid-OM1 in 11/2016, mild peri-infarct ischemia by NST in 07/2021 and medical management pursued), HFrEF (EF 40-45% in 2018, at 45% by repeat echo in 12/2018 and 35-40% in 05/2021 and 01/2022), HTN, HLD, RA, dilation of ascending aorta (at 4.2 cm by imaging in 2019 and 4.3 cm in 01/2022), palpitations (prior monitor showing PACs and PVCs with brief episodes of SVT and episodes of VT lasting for 16 beats) and COPD who presents to the office today for annual follow-up.  She was last examined by Dr. Emmer in 02/2023 and she denied any specific anginal symptoms at that time.  Had recent been diagnosed with diverticulitis and was on antibiotics for this. No changes were made in her cardiac medications and she was continued on ASA 81 mg daily, Coreg  3.125 mg twice daily, Crestor  20 mg daily, triamterene -HCTZ 37.5-25 mg daily and Entresto  24-26 mg twice daily.  ROS: ***  Studies Reviewed:   EKG: EKG is*** ordered today and demonstrates ***   EKG Interpretation Date/Time:    Ventricular Rate:    PR Interval:    QRS Duration:    QT Interval:    QTC Calculation:   R Axis:      Text Interpretation:         Echocardiogram: 01/2022 IMPRESSIONS     1. Left ventricular ejection fraction, by estimation, is 35 to 40%. The  left ventricle has moderately decreased function. There is global  hypokinesis that appears slightly worse in the basal-to-mid inferior and  basal-to-mid inferoseptal segments. There  is mild concentric left ventricular hypertrophy. Indeterminate diastolic  filling due to E-A fusion. There is septal-lateral dyssynchrony in the  setting of a LBBB.   2. Right ventricular systolic function is normal. The right  ventricular  size is normal. There is normal pulmonary artery systolic pressure. The  estimated right ventricular systolic pressure is 20.8 mmHg.   3. Moderate pericardial effusion. The pericardial effusion is localized  near the right ventricle and measures 1.6cm at max diameter. There is no  evidence of cardiac tamponade. Compared to prior TTE, the effusion appears  slightly larger on current study  but still within moderate range. Recommend serial monitoring with TTEs.   4. The mitral valve is normal in structure. Trivial mitral valve  regurgitation.   5. The aortic valve was not well visualized. There is mild calcification  of the aortic valve. There is mild thickening of the aortic valve. Aortic  valve regurgitation is trivial. Aortic valve sclerosis/calcification is  present, without any evidence of  aortic stenosis. Aortic valve mean gradient measures 5.0 mmHg.   6. The inferior vena cava is normal in size with greater than 50%  respiratory variability, suggesting right atrial pressure of 3 mmHg.   7. Left atrial size was mildly dilated.   Comparison(s): Compared to prior TTE, there is no significant change. EF  remains stable at 35-40%. There continues to be a moderate pericardial  effusion that is slightly larger on current study. Recommend continued  monitoring as outpatient.   Event Monitor: 08/2022 Patch Wear Time:  13 days and 23 hours (2024-03-20T14:38:42-0400 to 2024-04-03T14:38:34-0400)   Patient had a min HR of 49 bpm,  max HR of 197 bpm, and avg HR of 75 bpm. Predominant underlying rhythm was Sinus Rhythm. Bundle Branch Block/IVCD was present. 11 Ventricular Tachycardia runs occurred, the run with the fastest interval lasting 16 beats  with a max rate of 197 bpm (avg 153 bpm); the run with the fastest interval was also the longest. 8 Supraventricular Tachycardia runs occurred, the run with the fastest interval lasting 7 beats with a max rate of 160 bpm, the longest  lasting 20 beats  with an avg rate of 126 bpm. Isolated SVEs were rare (<1.0%), SVE Couplets were rare (<1.0%), and SVE Triplets were rare (<1.0%). Isolated VEs were occasional (3.6%, 53407), VE Couplets were rare (<1.0%, 3087), and VE Triplets were rare (<1.0%, 497).  Ventricular Bigeminy and Trigeminy were present.  Risk Assessment/Calculations:   {Does this patient have ATRIAL FIBRILLATION?[phone]} No BP recorded.  {Refresh Note OR Click here to enter BP  :1}***         Physical Exam:   VS:  There were no vitals taken for this visit.   Wt Readings from Last 3 Encounters:  04/21/24 153 lb (69.4 kg)  02/25/24 159 lb (72.1 kg)  02/24/24 159 lb (72.1 kg)     GEN: Well nourished, well developed in no acute distress NECK: No JVD; No carotid bruits CARDIAC: ***RRR, no murmurs, rubs, gallops RESPIRATORY:  Clear to auscultation without rales, wheezing or rhonchi  ABDOMEN: Appears non-distended. No obvious abdominal masses. EXTREMITIES: No clubbing or cyanosis. No edema.  Distal pedal pulses are 2+ bilaterally.   Assessment and Plan:      {Are you ordering a CV Procedure (e.g. stress test, cath, DCCV, TEE, etc)?   Press F2        :[id number]}   Signed, Laymon CHRISTELLA Qua, PA-C   "

## 2024-06-30 ENCOUNTER — Ambulatory Visit: Admitting: Physician Assistant

## 2024-07-21 ENCOUNTER — Ambulatory Visit: Admitting: Family Medicine

## 2025-04-26 ENCOUNTER — Ambulatory Visit: Payer: Self-pay
